# Patient Record
Sex: Female | Born: 1950
Health system: Southern US, Community
[De-identification: ages and names within clinical notes are randomized; demographics above are authoritative.]

## PROBLEM LIST (undated history)

## (undated) DIAGNOSIS — Z20818 Contact with and (suspected) exposure to other bacterial communicable diseases: Secondary | ICD-10-CM

## (undated) DIAGNOSIS — I341 Nonrheumatic mitral (valve) prolapse: Secondary | ICD-10-CM

## (undated) DIAGNOSIS — K589 Irritable bowel syndrome without diarrhea: Secondary | ICD-10-CM

## (undated) DIAGNOSIS — A0472 Enterocolitis due to Clostridium difficile, not specified as recurrent: Secondary | ICD-10-CM

## (undated) DIAGNOSIS — K819 Cholecystitis, unspecified: Secondary | ICD-10-CM

## (undated) DIAGNOSIS — B019 Varicella without complication: Secondary | ICD-10-CM

## (undated) DIAGNOSIS — M1990 Unspecified osteoarthritis, unspecified site: Secondary | ICD-10-CM

## (undated) DIAGNOSIS — Z8619 Personal history of other infectious and parasitic diseases: Secondary | ICD-10-CM

## (undated) DIAGNOSIS — I00 Rheumatic fever without heart involvement: Secondary | ICD-10-CM

## (undated) HISTORY — DX: Enterocolitis due to Clostridium difficile, not specified as recurrent: A04.72

## (undated) HISTORY — DX: Unspecified osteoarthritis, unspecified site: M19.90

## (undated) HISTORY — DX: Nonrheumatic mitral (valve) prolapse: I34.1

## (undated) HISTORY — DX: Rheumatic fever without heart involvement: I00

## (undated) HISTORY — DX: Varicella without complication: B01.9

## (undated) HISTORY — DX: Irritable bowel syndrome, unspecified: K58.9

## (undated) HISTORY — PX: CATARACT EXTRACTION: SUR2

## (undated) HISTORY — PX: MUSCLE BIOPSY: SHX716

## (undated) HISTORY — DX: Contact with and (suspected) exposure to other bacterial communicable diseases: Z20.818

## (undated) HISTORY — DX: Cholecystitis, unspecified: K81.9

## (undated) HISTORY — DX: Personal history of other infectious and parasitic diseases: Z86.19

---

## 1999-05-02 ENCOUNTER — Other Ambulatory Visit: Admission: RE | Admit: 1999-05-02 | Discharge: 1999-05-02 | Payer: Self-pay | Admitting: Obstetrics & Gynecology

## 2003-07-30 DIAGNOSIS — Z20818 Contact with and (suspected) exposure to other bacterial communicable diseases: Secondary | ICD-10-CM

## 2003-07-30 HISTORY — DX: Contact with and (suspected) exposure to other bacterial communicable diseases: Z20.818

## 2011-11-27 DIAGNOSIS — K819 Cholecystitis, unspecified: Secondary | ICD-10-CM

## 2011-11-27 HISTORY — DX: Cholecystitis, unspecified: K81.9

## 2011-12-17 ENCOUNTER — Emergency Department: Payer: Self-pay | Admitting: Emergency Medicine

## 2011-12-17 LAB — URINALYSIS, COMPLETE
Bacteria: NONE SEEN
Bilirubin,UR: NEGATIVE
Blood: NEGATIVE
Glucose,UR: NEGATIVE mg/dL (ref 0–75)
Ketone: NEGATIVE
Leukocyte Esterase: NEGATIVE
Nitrite: NEGATIVE
Ph: 5 (ref 4.5–8.0)
Protein: NEGATIVE
RBC,UR: <1 /HPF (ref 0–5)
Specific Gravity: 1.015 (ref 1.003–1.030)
Squamous Epithelial: NONE SEEN
WBC UR: 1 /HPF (ref 0–5)

## 2011-12-17 LAB — BASIC METABOLIC PANEL
Anion Gap: 8 (ref 7–16)
BUN: 8 mg/dL (ref 7–18)
Calcium, Total: 8.8 mg/dL (ref 8.5–10.1)
Chloride: 109 mmol/L — ABNORMAL HIGH (ref 98–107)
Co2: 26 mmol/L (ref 21–32)
Creatinine: 0.62 mg/dL (ref 0.60–1.30)
EGFR (African American): >60
EGFR (Non-African Amer.): >60
Glucose: 58 mg/dL — ABNORMAL LOW (ref 65–99)
Osmolality: 281 (ref 275–301)
Potassium: 3.9 mmol/L (ref 3.5–5.1)
Sodium: 143 mmol/L (ref 136–145)

## 2011-12-17 LAB — CBC
HCT: 42.5 % (ref 35.0–47.0)
HGB: 14.1 g/dL (ref 12.0–16.0)
MCH: 29.2 pg (ref 26.0–34.0)
MCHC: 33.3 g/dL (ref 32.0–36.0)
MCV: 88 fL (ref 80–100)
Platelet: 271 10*3/uL (ref 150–440)
RBC: 4.83 10*6/uL (ref 3.80–5.20)
RDW: 13.3 % (ref 11.5–14.5)
WBC: 7 10*3/uL (ref 3.6–11.0)

## 2011-12-17 LAB — TROPONIN I: Troponin-I: <0.02 ng/mL

## 2011-12-17 IMAGING — CR DG CHEST 1V PORT
1 series · 1 of 1 positions shown · non-contrast
Comparison: none

REASON FOR EXAM: arm tingling
COMMENTS:

[portable]
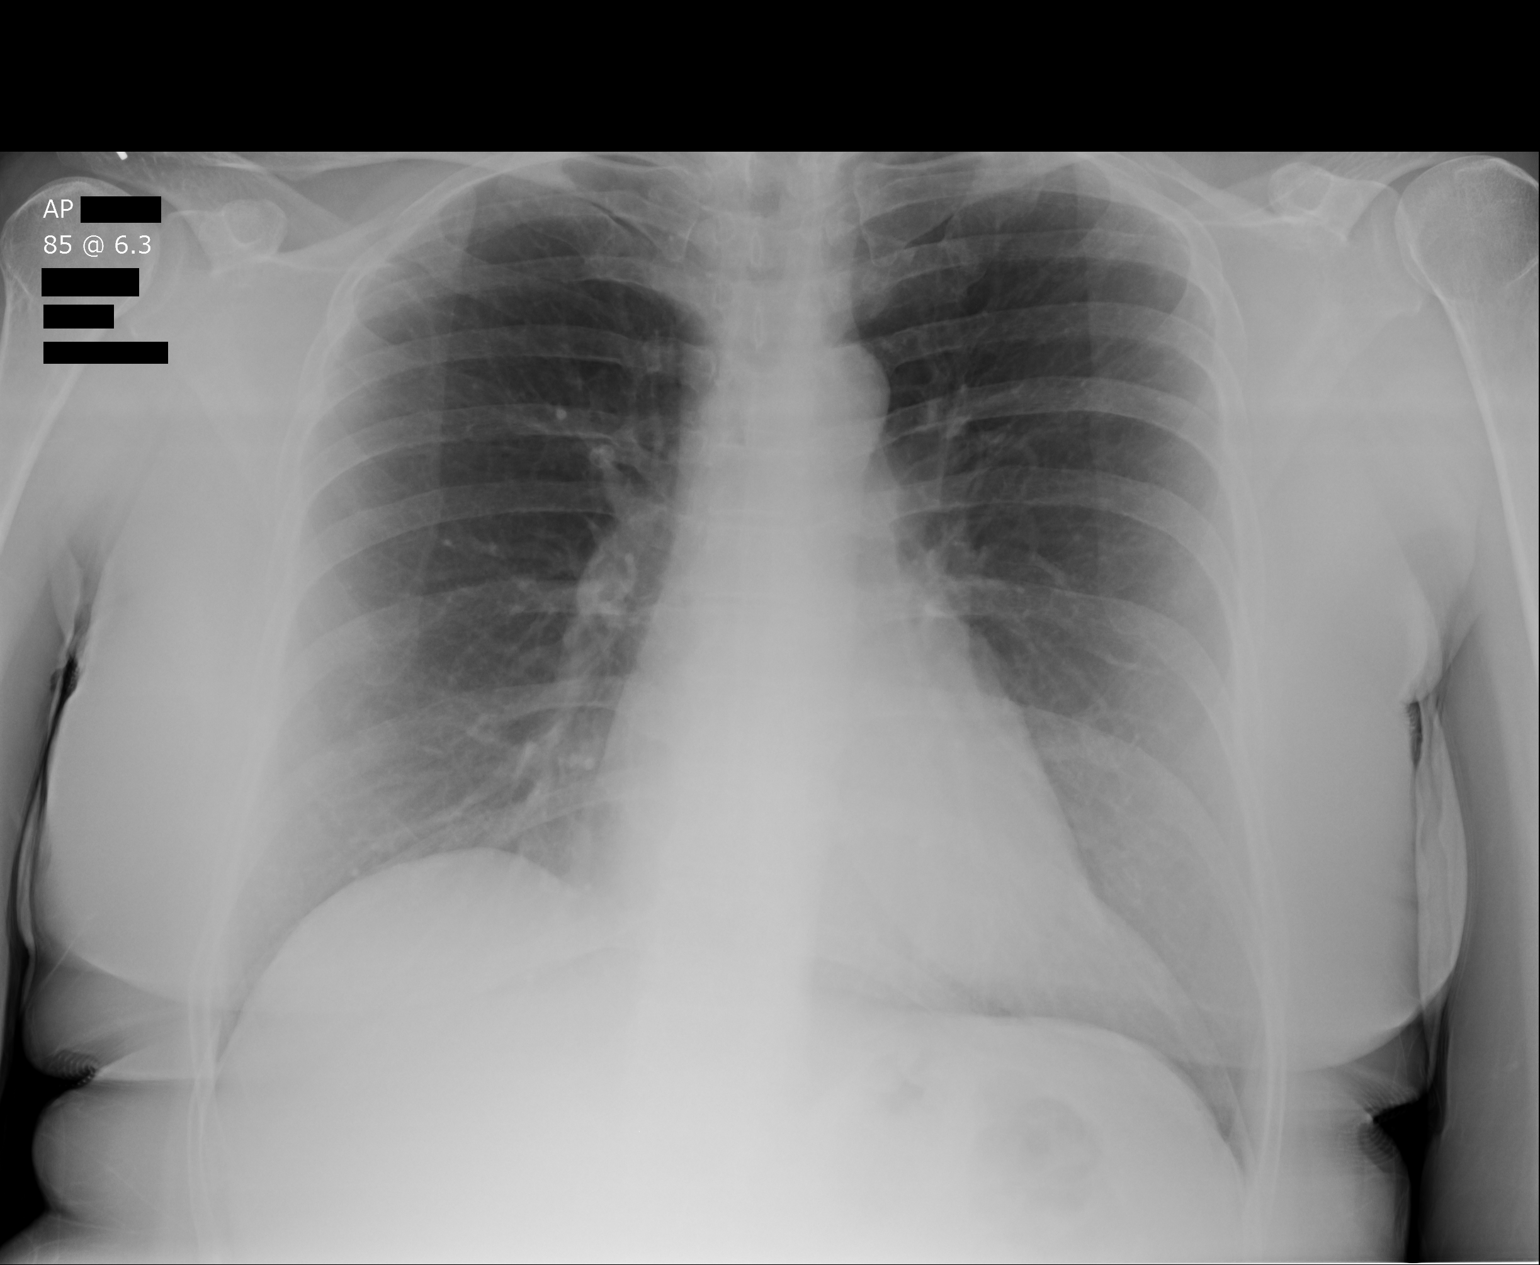

[1 of 1 positions shown; findings below may reference images not displayed]

PROCEDURE:     DXR - DXR PORTABLE CHEST SINGLE VIEW  - [DATE]  [DATE]

RESULT:     The projection is lordotic. The lungs are clear. The heart and
pulmonary vessels are normal. The bony and mediastinal structures are
unremarkable. There is no effusion. There is no pneumothorax or evidence of
congestive failure.
IMPRESSION: No acute cardiopulmonary disease.

[REDACTED]

## 2012-07-28 ENCOUNTER — Telehealth: Payer: Self-pay | Admitting: Internal Medicine

## 2012-07-28 NOTE — Telephone Encounter (Signed)
Routing to Dr. Scott

## 2012-07-28 NOTE — Telephone Encounter (Signed)
I called and spoke with patient and advised her of the information that Dr. Lorin Picket put down below. Patient states she has been trying to establish with Dr. Lorin Picket for the past 10 years, she does have an appointment with Dr. Lorin Picket on 09/08/12 to establish care.  She did understand that we don't have any appointments available. She wasn't really impressed with acute care but she will do something.

## 2012-07-28 NOTE — Telephone Encounter (Signed)
If severe ear pain does need evaluation.  I have never seen this pt and she is not an established pt at the office.  Will need to establish care here.  I rec if increased pain - reevaluation at acute care.  I am also unable to work in today and we will not be in the office tomorrow.

## 2012-07-28 NOTE — Telephone Encounter (Signed)
Patient Information:  Caller Name: Anetra  Phone: [phone]  Patient: Lynn Mann, Lynn Mann  Gender: Female  DOB: [birth year]  Age: 61 Years  PCP: [facility]  Office Follow Up:  Does the office need to follow up with this patient?: Yes  Instructions For The Office: if there are any work-ins available today or tomorrow please call office back   Symptoms  Reason For Call & Symptoms: Pt will become a pt in Feb. She has a new pt appt scheduled in Feb/2014 with Dr. Lorin Picket. She is calling because she has severe ear pain in both ears. Onset yesterday. Last evening the pt went to [facility] and was sent home with home care. Pt is still c/o.  Reviewed Health History In EMR: Yes  Reviewed Medications In EMR: Yes  Reviewed Allergies In EMR: Yes  Reviewed Surgeries / Procedures: Yes  Date of Onset of Symptoms: 07/27/2012  Guideline(s) Used:  Earache  Disposition Per Guideline:   Go to Office Now  Reason For Disposition Reached:   Severe earache pain  Advice Given:  N/A  RN Overrode Recommendation:  Go To [facility]  Pt has not been seen at this office yet/she has a new pt appt scheduled for February. She would like to be seen at this office if possible. Please call her back if any work - ins available.

## 2012-08-27 ENCOUNTER — Telehealth: Payer: Self-pay | Admitting: Internal Medicine

## 2012-08-27 NOTE — Telephone Encounter (Signed)
Patient Information:  Caller Name: Milo  Phone: [phone]  Patient: Lynn Mann, Lynn Mann  Gender: Female  DOB: 1951/05/20  Age: 62 Years  PCP: [facility] (Adults only)  Office Follow Up:  Does the office need to follow up with this patient?: No  Instructions For The Office: N/A   Symptoms  Reason For Call & Symptoms: Patient states she is having pain in back. Onset last Wednesday, while getting out of bed.  Located- right side flank pain .  Radiates down into buttock.  Pain with pushing accelerator on car, getting into the car.  Pain with having BM but relief after going by relieving pressure.  Intermittent in nature.  Reviewed Health History In EMR: Yes  Reviewed Medications In EMR: Yes  Reviewed Allergies In EMR: Yes  Reviewed Surgeries / Procedures: No  Date of Onset of Symptoms: 08/12/2012  Treatments Tried: Aleve or ASA  Treatments Tried Worked: Yes  Guideline(s) Used:  Back Pain  Disposition Per Guideline:   See Today or Tomorrow in Office  Reason For Disposition Reached:   Age > 50 and no history of prior similar back pain  Advice Given:  Reassurance:  Twisting or heavy lifting can cause back pain.  With treatment, the pain most often goes away in 1-2 weeks.  You can treat most back pain at home.  Here is some care advice that should help.  Cold or Heat:  Heat Pack: If pain lasts over 2 days, apply heat to the sore area. Use a heat pack, heating pad, or warm wet washcloth. Do this for 10 minutes, then as needed. For widespread stiffness, take a hot bath or hot shower instead. Move the sore area under the warm water.  Sleep:  Sleep on your side with a pillow between your knees. If you sleep on your back, put a pillow under your knees.  Avoid sleeping on your stomach.  Your mattress should be firm. Avoid waterbeds.  Activity  Keep doing your day-to-day activities if it is not too painful. Staying active is better than resting.  Avoid anything that makes your pain  worse. Avoid heavy lifting, twisting, and too much exercise until your back heals.  You do not need to stay in bed.  Pain Medicines:  For pain relief, take acetaminophen, ibuprofen, or naproxen.  Use the lowest amount of medicine that makes your pain feel better.  Acetaminophen (e.g., Tylenol):  Another choice is to take 1,000 mg every 8 hours as needed. Each Extra Strength Tylenol pill has 500 mg of acetaminophen. The most you should take each day is 3,000 mg (6 Extra Strength pills a day).  Call Back If:  Numbness or weakness occur  Bowel/bladder problems occur  Pain lasts for more than 2 weeks  You become worse.  Appointment Scheduled:  08/28/2012 08:30:00 Appointment Scheduled Provider:  Orville Govern

## 2012-08-28 ENCOUNTER — Encounter: Payer: Self-pay | Admitting: Adult Health

## 2012-08-28 ENCOUNTER — Ambulatory Visit: Payer: Self-pay | Admitting: Adult Health

## 2012-08-28 ENCOUNTER — Ambulatory Visit (INDEPENDENT_AMBULATORY_CARE_PROVIDER_SITE_OTHER): Payer: BC Managed Care – PPO | Admitting: Adult Health

## 2012-08-28 ENCOUNTER — Ambulatory Visit: Payer: Self-pay

## 2012-08-28 VITALS — BP 116/79 | HR 93 | Temp 99.1°F | Resp 16 | Ht 67.0 in | Wt 197.0 lb

## 2012-08-28 DIAGNOSIS — M545 Low back pain, unspecified: Secondary | ICD-10-CM

## 2012-08-28 DIAGNOSIS — Z8679 Personal history of other diseases of the circulatory system: Secondary | ICD-10-CM | POA: Insufficient documentation

## 2012-08-28 DIAGNOSIS — R1031 Right lower quadrant pain: Secondary | ICD-10-CM

## 2012-08-28 DIAGNOSIS — I341 Nonrheumatic mitral (valve) prolapse: Secondary | ICD-10-CM | POA: Insufficient documentation

## 2012-08-28 LAB — CBC WITH DIFFERENTIAL/PLATELET
Basophils Absolute: 0 10*3/uL (ref 0.0–0.1)
Basophils Relative: 0.4 % (ref 0.0–3.0)
Eosinophils Absolute: 0.2 10*3/uL (ref 0.0–0.7)
Eosinophils Relative: 3.3 % (ref 0.0–5.0)
HCT: 39.6 % (ref 36.0–46.0)
Hemoglobin: 13.4 g/dL (ref 12.0–15.0)
Lymphocytes Relative: 26.8 % (ref 12.0–46.0)
Lymphs Abs: 1.8 10*3/uL (ref 0.7–4.0)
MCHC: 33.9 g/dL (ref 30.0–36.0)
MCV: 86.4 fl (ref 78.0–100.0)
Monocytes Absolute: 0.3 10*3/uL (ref 0.1–1.0)
Monocytes Relative: 4.9 % (ref 3.0–12.0)
Neutro Abs: 4.4 10*3/uL (ref 1.4–7.7)
Neutrophils Relative %: 64.6 % (ref 43.0–77.0)
Platelets: 307 10*3/uL (ref 150.0–400.0)
RBC: 4.58 Mil/uL (ref 3.87–5.11)
RDW: 13.5 % (ref 11.5–14.6)
WBC: 6.8 10*3/uL (ref 4.5–10.5)

## 2012-08-28 LAB — URINALYSIS, ROUTINE W REFLEX MICROSCOPIC
Bilirubin Urine: NEGATIVE
Hgb urine dipstick: NEGATIVE
Leukocytes, UA: NEGATIVE
Nitrite: NEGATIVE
Specific Gravity, Urine: >=1.03 (ref 1.000–1.030)
Total Protein, Urine: NEGATIVE
Urine Glucose: NEGATIVE
Urobilinogen, UA: 0.2 (ref 0.0–1.0)
pH: 5.5 (ref 5.0–8.0)

## 2012-08-28 LAB — SEDIMENTATION RATE: Sed Rate: 16 mm/hr (ref 0–22)

## 2012-08-28 IMAGING — CT CT ABD-PELV W/ CM
1 of 2 series · 15 of 32 positions shown, 19 images · IV contrast (isovue)
Comparison: None

REASON FOR EXAM: Call Report  [PHONE_NUMBER]  abd pain RLQ  eval for
appendicitis  MORIKAWA NP ...
COMMENTS:

PROCEDURE:     CT  - CT ABDOMEN / PELVIS  W  - [DATE]  [DATE]
RESULT:     History: Right lower quadrant pain
TECHNIQUE: Multiple axial images of the abdomen and pelvis were performed
from the lung bases to the pubic symphysis, with p.o. contrast and with 85
ml of Isovue 370 intravenous contrast.

[Series 2: 3mm soft tissue · axial · 0.76mm/px · z∈[-462,-22]mm · 15 of 162 slices shown, 19 images]
[im 8/162  soft-tissue]
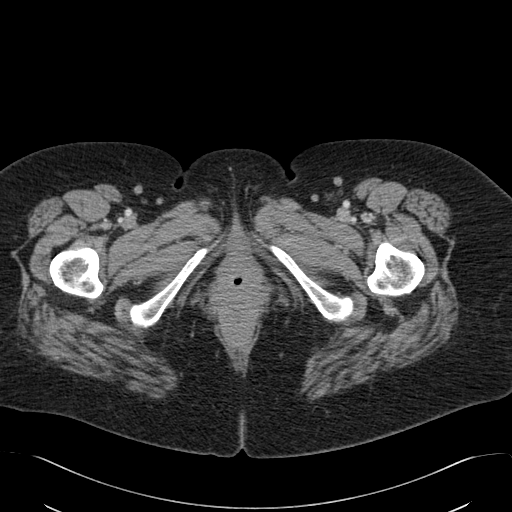
[im 8/162  bone]
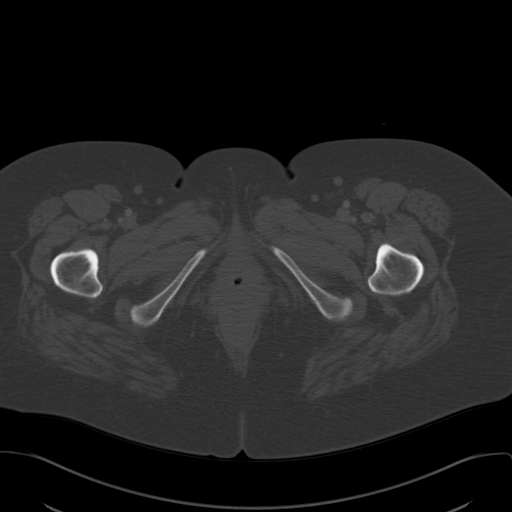
[im 22/162  soft-tissue]
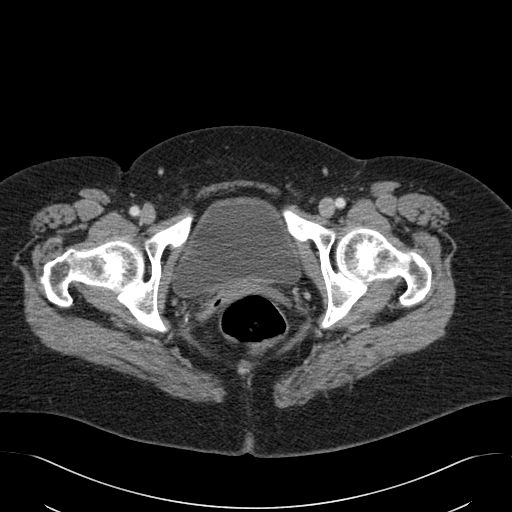
[im 36/162  soft-tissue]
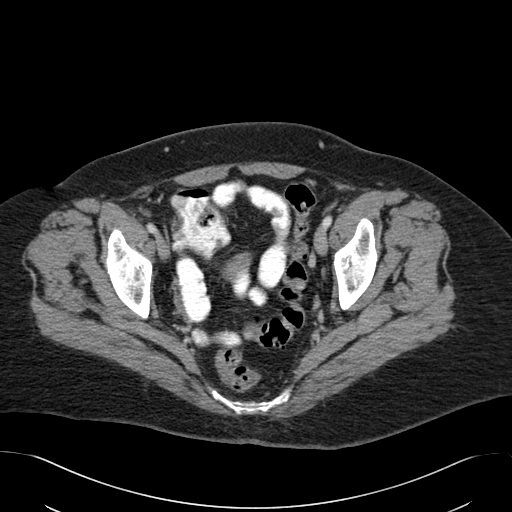
[im 43/162  soft-tissue]
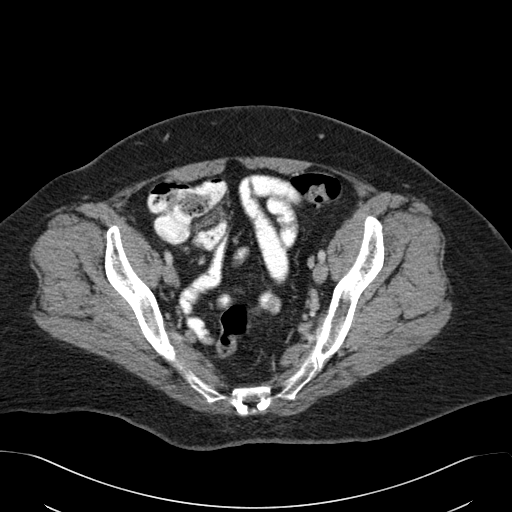
[im 57/162  soft-tissue]
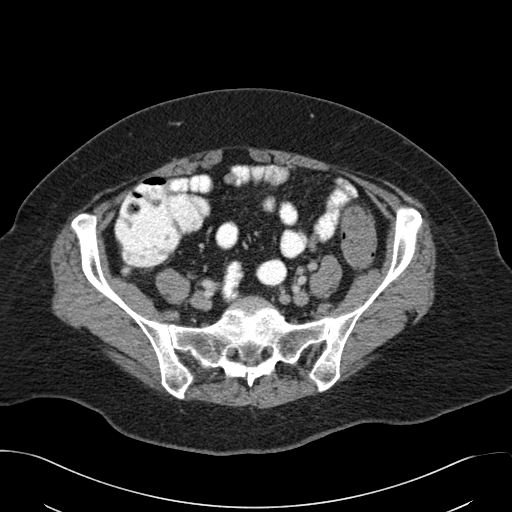
[im 71/162  soft-tissue]
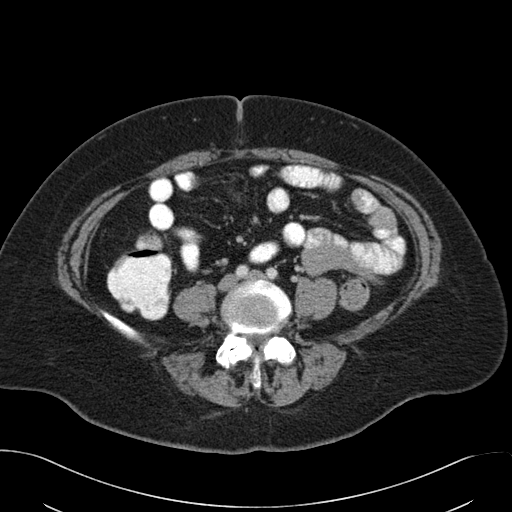
[im 85/162  soft-tissue]
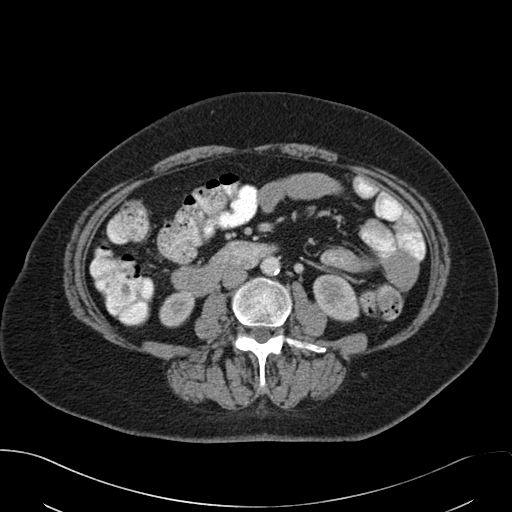
[im 92/162  soft-tissue]
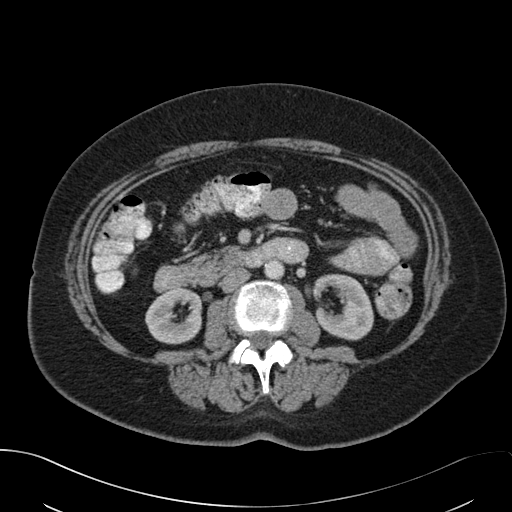
[im 106/162  soft-tissue]
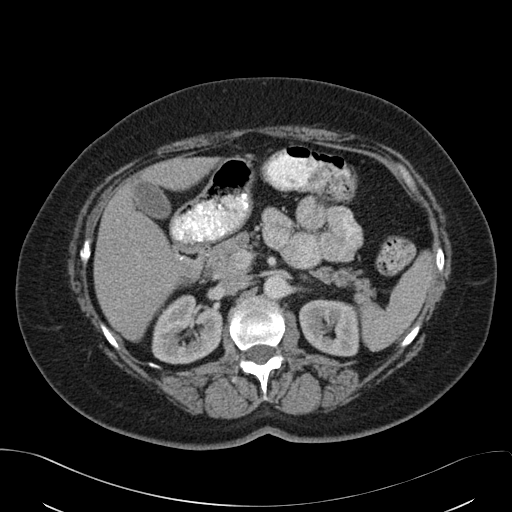
[im 106/162  bone]
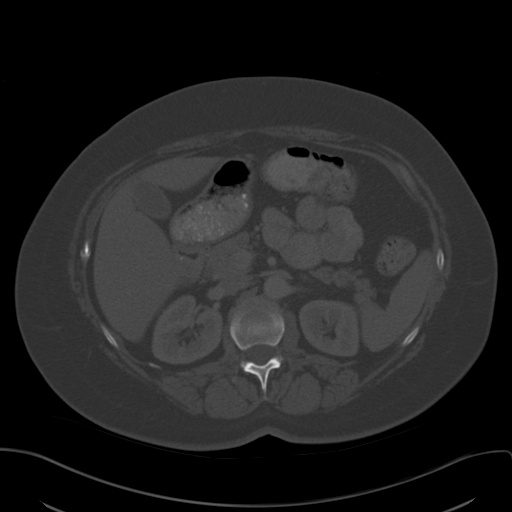
[im 120/162  soft-tissue]
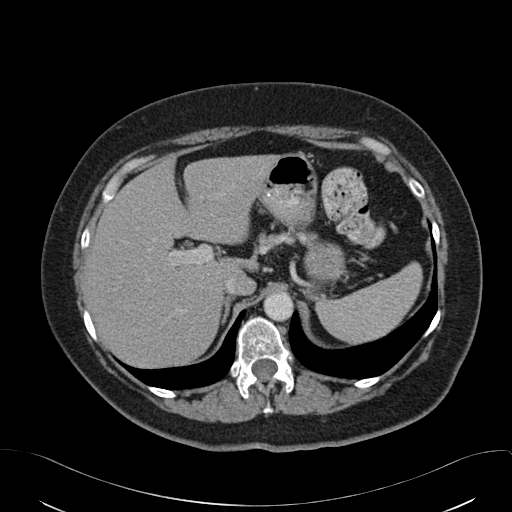
[im 127/162  soft-tissue]
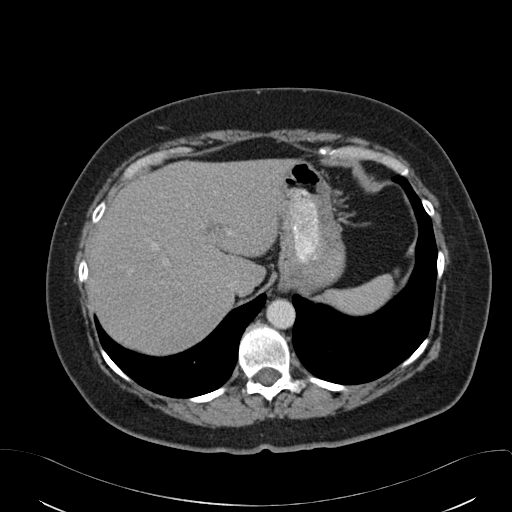
[im 134/162  lung]
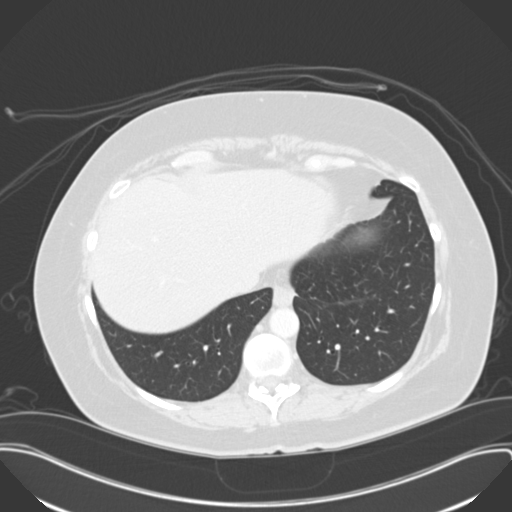
[im 141/162  soft-tissue]
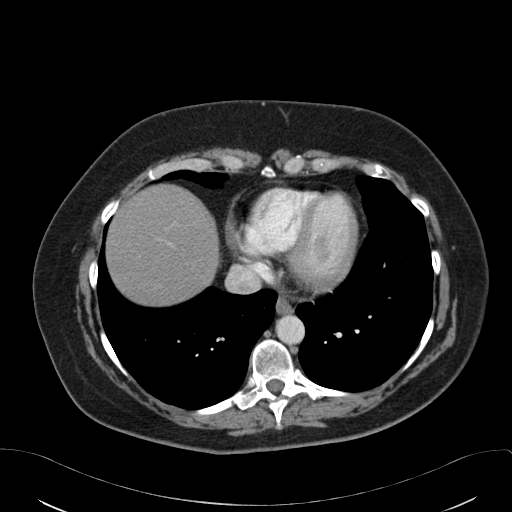
[im 141/162  lung]
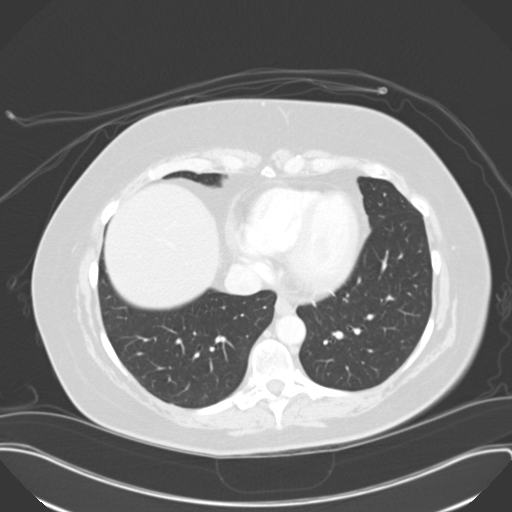
[im 148/162  lung]
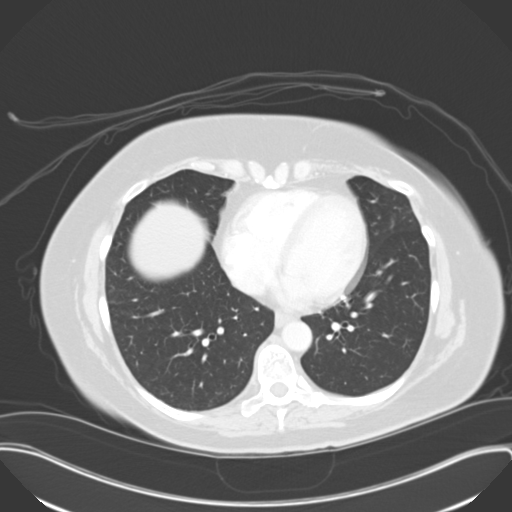
[im 155/162  soft-tissue]
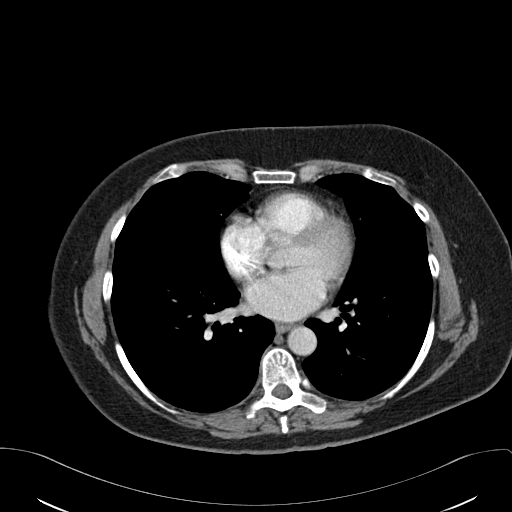
[im 155/162  lung]
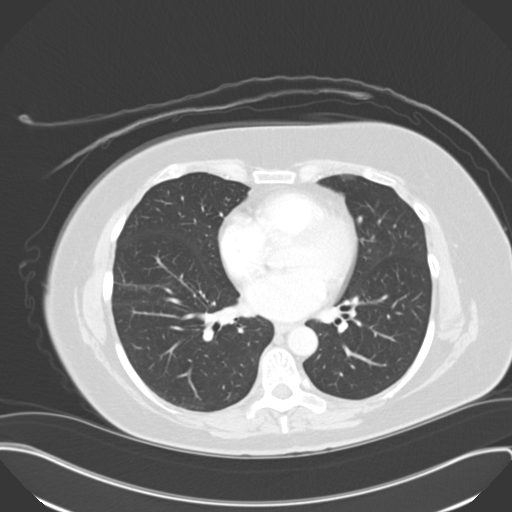

[15 of 32 positions shown; findings below may reference images not displayed]

FINDINGS: The lung bases are clear. There is no pneumothorax. The heart size is
normal.

The liver demonstrates no focal abnormality. There is no intrahepatic or
extrahepatic biliary ductal dilatation. The gallbladder is unremarkable. The
spleen demonstrates no focal abnormality. The kidneys, adrenal glands, and
pancreas are normal. The bladder is unremarkable.

The stomach, duodenum, small intestine, and large intestine demonstrate no
contrast extravasation or dilatation. There is a normal caliber appendix in
the right lower quadrant without periappendiceal inflammatory changes. There
is no pneumoperitoneum, pneumatosis, or portal venous gas. There is no
abdominal or pelvic free fluid. There is no lymphadenopathy.

The abdominal aorta is normal in caliber .

The osseous structures are unremarkable.
IMPRESSION: 1. Normal appendix.

[REDACTED]

## 2012-08-28 MED ORDER — CYCLOBENZAPRINE HCL 5 MG PO TABS: 5.0000 mg | ORAL_TABLET | Freq: Three times a day (TID) | ORAL | Status: DC | PRN

## 2012-08-28 NOTE — Assessment & Plan Note (Addendum)
Positive rebound tenderness. Positive obturator sign. R/O appendicitis. Check cbc, sed rate, urinalysis. CT abdomen/pelvis w IV and oral contrast. Patient sent to [facility] for CT scan. Note, greater than 45 minutes were spent in direct face to face communication with patient.

## 2012-08-28 NOTE — Patient Instructions (Addendum)
Abdominal right lower quadrant pain - CT scan was negative for appendicitis.  Your labs - cbc, sed rate, urinalysis were normal.  For back pain:  Flexeril 5 mg 3 times daily as needed for spasms. Apply ice/heat for 20 min, 3-4 times daily Ibuprofen 600 mg every 6 hours as needed for pain Norco 5/325 mg 1 tablet as needed for stronger pain not relieved with Ibuprofen If you lie on your back, use a firm pillow below your knees to support your back If you lie on your side, use a firm pillow between your knees to help keep your hips aligned Continue your normal activity as tolerated. You do not need to stay in bed.  If your symptoms are not resolved within 4 weeks we will send you for xray of your back.  Please report any numbness, tingling or loss of sensation/mobility immediately.

## 2012-08-28 NOTE — Assessment & Plan Note (Signed)
Will start muscle relaxer, NSAIDs, pain medication, ice/heat, continue normal activity as tolerated. If pain not resolved within 4 weeks will send for films to further evaluate.

## 2012-08-28 NOTE — Progress Notes (Signed)
  Subjective:    Patient ID: Lynn Mann, female    DOB: Oct 07, 1950, 62 y.o.   MRN: [id number]  HPI  Patient is a 62 y/o female who presents to clinic today for the first time with c/o back pain. She describes the pain as localized over right lower back. The pain does not radiate down the leg. She denies loss of sensation, paresthesia, numbness, or loss of bladder/bowel function. The pain started approximately 1 week ago. She cannot remember any activity that contributed to her back pain. The only things she recalls is that she went to eat [place] food and woke up the next day with the low back pain. She has tried aleve which has helped somewhat. She has not iced the area or applied heat. Pt also reports that having a BM alleviated the pain. She denies constipation, n/v, diarrhea.  Patient also reports that she has abdominal RLQ pain. This started around the same time as her back pain - after eating [place] food. The pain does not radiate to the back or vice versa. She reports the pain is aggravated with movement or when she eats. She denies fever, chills. Patient has her appendix.   Medications:  Hydrocodone 5/325 1 tablet every 6 hours prn pain (Cholecystitis)    Review of Systems  Constitutional: Negative for fever, chills, activity change, appetite change and fatigue.  HENT: Negative.   Eyes: Negative.   Respiratory: Negative.   Cardiovascular: Negative.   Gastrointestinal: Positive for abdominal pain. Negative for nausea, vomiting, diarrhea, constipation and blood in stool.       Occassional gnawing feeling in stomach  Genitourinary: Negative for dysuria, urgency, frequency, hematuria, flank pain, decreased urine volume, vaginal bleeding, vaginal discharge and vaginal pain.       RLQ pain.  Musculoskeletal: Positive for back pain. Negative for gait problem.  Skin: Negative for rash and wound.  Neurological: Negative for dizziness, tremors, weakness, light-headedness, numbness and  headaches.  Psychiatric/Behavioral: Negative.         Objective:   Physical Exam  Constitutional: She is oriented to person, place, and time. She appears well-developed and well-nourished. No distress.  HENT:  Head: Normocephalic and atraumatic.  Cardiovascular: Normal rate and regular rhythm.   Pulmonary/Chest: Effort normal and breath sounds normal.  Abdominal: Soft. Bowel sounds are normal. She exhibits no mass. There is tenderness. There is rebound and guarding.  Musculoskeletal: Normal range of motion.  Neurological: She is alert and oriented to person, place, and time.  Skin: Skin is warm and dry.  Psychiatric: She has a normal mood and affect. Her behavior is normal. Judgment and thought content normal.          Assessment & Plan:

## 2012-08-30 LAB — URINE CULTURE: Colony Count: 15000

## 2012-09-04 ENCOUNTER — Encounter: Payer: Self-pay | Admitting: *Deleted

## 2012-09-04 ENCOUNTER — Telehealth: Payer: Self-pay | Admitting: *Deleted

## 2012-09-04 ENCOUNTER — Other Ambulatory Visit: Payer: Self-pay | Admitting: Adult Health

## 2012-09-04 NOTE — Telephone Encounter (Signed)
I have not seen this pt.  It looks like the only person she has seen is Lynn Mann.  Can forward to Lynn Mann.  If got a med from another md - will probably need to call them to get clarification on medication.

## 2012-09-04 NOTE — Telephone Encounter (Signed)
Patient calling stating she has to cancel her appt on Tuesday because she's going out of town for 2 weeks maybe, on Sunday. She has a bottle of hydrocodone that was filled in [place] at the ER and per pt the label has worn off and she can't take the bottle on the airplane. Patient would like a new label or new rx? Please advise

## 2012-09-04 NOTE — Telephone Encounter (Signed)
Refill called in to [facility] in [place]

## 2012-09-08 ENCOUNTER — Ambulatory Visit: Payer: Self-pay | Admitting: Internal Medicine

## 2012-11-06 ENCOUNTER — Ambulatory Visit (INDEPENDENT_AMBULATORY_CARE_PROVIDER_SITE_OTHER): Payer: BC Managed Care – PPO | Admitting: Internal Medicine

## 2012-11-06 ENCOUNTER — Encounter: Payer: Self-pay | Admitting: Internal Medicine

## 2012-11-06 VITALS — BP 126/68 | HR 94 | Temp 98.1°F | Resp 18 | Ht 68.0 in | Wt 190.8 lb

## 2012-11-06 DIAGNOSIS — I341 Nonrheumatic mitral (valve) prolapse: Secondary | ICD-10-CM

## 2012-11-06 DIAGNOSIS — Z8619 Personal history of other infectious and parasitic diseases: Secondary | ICD-10-CM

## 2012-11-06 DIAGNOSIS — Z13220 Encounter for screening for lipoid disorders: Secondary | ICD-10-CM

## 2012-11-06 DIAGNOSIS — Z1239 Encounter for other screening for malignant neoplasm of breast: Secondary | ICD-10-CM

## 2012-11-06 DIAGNOSIS — I059 Rheumatic mitral valve disease, unspecified: Secondary | ICD-10-CM

## 2012-11-06 DIAGNOSIS — K589 Irritable bowel syndrome without diarrhea: Secondary | ICD-10-CM

## 2012-11-06 DIAGNOSIS — K8020 Calculus of gallbladder without cholecystitis without obstruction: Secondary | ICD-10-CM

## 2012-11-06 DIAGNOSIS — Z8679 Personal history of other diseases of the circulatory system: Secondary | ICD-10-CM

## 2012-11-07 ENCOUNTER — Encounter: Payer: Self-pay | Admitting: Internal Medicine

## 2012-11-07 DIAGNOSIS — K589 Irritable bowel syndrome without diarrhea: Secondary | ICD-10-CM | POA: Insufficient documentation

## 2012-11-07 DIAGNOSIS — K8020 Calculus of gallbladder without cholecystitis without obstruction: Secondary | ICD-10-CM | POA: Insufficient documentation

## 2012-11-07 NOTE — Assessment & Plan Note (Signed)
Has a history of MVP.  Obtain ECHO report.

## 2012-11-07 NOTE — Assessment & Plan Note (Signed)
States bowels are stable. Last colonoscopy 2007.

## 2012-11-07 NOTE — Progress Notes (Signed)
Subjective:    Patient ID: Lynn Mann, female    DOB: 1951-07-12, 62 y.o.   MRN: [id number]  HPI 62 year old female with past history of MVP, rheumatic fever, and IBS who comes in today to follow up on these issues as well as to establish care.  She has not had a regular primary care physician.  Has not had a mammogram or pap smear since 2007.  Previously saw Dr Donata Duff.   She was evaluated 5/13 by Dr Lady Gary.  Had a negative stress test and ECHO reportedly ok.  Denies any chest pain or tightness with increased activity or exertion.  Was having back pain in 8/13.  Was found to have gallstones.  Was evaluated by Dr Maxcine Ham ([facility] - surgery).  Did not recommend surgery.  Has had no reoccurrence.  Does have a history of IBS.  May have a bowel movement 2-3x/day.  This is normal for her.  No blood.  Had a colonoscopy 2007.     Past Medical History  Diagnosis Date  . Arthritis   . Chicken pox   . Rheumatic fever   . MRSA exposure 2005    Spider bite  . Cholecystitis 11/2011    Did not require sgy - Dr. Sammuel Cooper - [facility]  (cholelithiasis)  . MVP (mitral valve prolapse)     Stable - Dr. Lady Gary  . Hypertension   . IBS (irritable bowel syndrome)   . H/O Clostridium difficile infection     Current Outpatient Prescriptions on File Prior to Visit  Medication Sig Dispense Refill  . cyclobenzaprine (FLEXERIL) 5 MG tablet Take 1 tablet (5 mg total) by mouth 3 (three) times daily as needed for muscle spasms.  30 tablet  0  . HYDROcodone-acetaminophen (NORCO/VICODIN) 5-325 MG per tablet Take 1 tablet by mouth every 6 (six) hours as needed.       No current facility-administered medications on file prior to visit.    Review of Systems Patient denies any headache, lightheadedness or dizziness.  No sinus or allergy symptoms.  No chest pain, tightness or palpitations.  No increased shortness of breath, cough or congestion.  No acid reflux.  No dysphagia.  No nausea or vomiting.  No abdominal pain or  cramping.  No bowel change, such as diarrhea, constipation, BRBPR or melana.  Bowels stable for her.   No urine change.   Has never had an abnormal pap smear.       Objective:   Physical Exam Filed Vitals:   11/06/12 1109  BP: 126/68  Pulse: 94  Temp: 98.1 F (36.7 C)  Resp: 17   62 year old female in no acute distress.   HEENT:  Nares- clear.  Oropharynx - without lesions. NECK:  Supple.  Nontender.  No audible bruit.  Increased fullness right lateral neck (question of increased lymphadenopathy).  Non tender.  HEART:  Appears to be regular. LUNGS:  No crackles or wheezing audible.  Respirations even and unlabored.  RADIAL PULSE:  Equal bilaterally.  ABDOMEN:  Soft, nontender.  Bowel sounds present and normal.  No audible abdominal bruit.    EXTREMITIES:  No increased edema present.  DP pulses palpable and equal bilaterally.      SKIN:  No rash.      Assessment & Plan:  LYMPHADENOPATHY.  Increased fullness right lateral neck. She states she has noticed some increased fullness over the last two weeks.  No evidence of infection.  Discussed my desire to refer to ENT  for evaluation.  She declines at this time.  Wants to monitor.  Will call within the next two weeks.  If persistent, will require referral and further w/up.    HEALTH MAINTENANCE.  Overdue a physical.  Schedule a physical for next visit.  Schedule her mammogram.  Hopefully can obtain records (i.e., colonoscopy, etc).    I spent over 40 minutes with this patient with more than 50% of the time in consultation regarding the above.

## 2012-11-07 NOTE — Assessment & Plan Note (Signed)
Saw Dr Lady Gary 5/13.  States had normal stress test.  Also reports ECHO - ok.  Obtain records.

## 2012-11-07 NOTE — Assessment & Plan Note (Signed)
Has a history of gallstones.  Saw Dr Maxcine Ham ([facility] surgery).  Decided did not need cholecystectomy.  Currently doing well.  Follow.

## 2012-11-08 ENCOUNTER — Telehealth: Payer: Self-pay | Admitting: Internal Medicine

## 2012-11-08 ENCOUNTER — Encounter: Payer: Self-pay | Admitting: Internal Medicine

## 2012-11-08 NOTE — Telephone Encounter (Signed)
Pt left without scheduling labs.  She needs fasting labs scheduled in 1-2 weeks.  Thanks.

## 2012-11-09 NOTE — Telephone Encounter (Signed)
Scheduled

## 2012-11-17 ENCOUNTER — Other Ambulatory Visit (INDEPENDENT_AMBULATORY_CARE_PROVIDER_SITE_OTHER): Payer: BC Managed Care – PPO

## 2012-11-17 DIAGNOSIS — Z8619 Personal history of other infectious and parasitic diseases: Secondary | ICD-10-CM

## 2012-11-17 DIAGNOSIS — Z8679 Personal history of other diseases of the circulatory system: Secondary | ICD-10-CM

## 2012-11-17 DIAGNOSIS — K589 Irritable bowel syndrome without diarrhea: Secondary | ICD-10-CM

## 2012-11-17 DIAGNOSIS — Z13220 Encounter for screening for lipoid disorders: Secondary | ICD-10-CM

## 2012-11-17 LAB — CBC WITH DIFFERENTIAL/PLATELET
Basophils Absolute: 0 10*3/uL (ref 0.0–0.1)
Basophils Relative: 0.4 % (ref 0.0–3.0)
Eosinophils Absolute: 0.1 10*3/uL (ref 0.0–0.7)
Eosinophils Relative: 1.8 % (ref 0.0–5.0)
HCT: 39.2 % (ref 36.0–46.0)
Hemoglobin: 13.1 g/dL (ref 12.0–15.0)
Lymphocytes Relative: 36.5 % (ref 12.0–46.0)
Lymphs Abs: 1.9 10*3/uL (ref 0.7–4.0)
MCHC: 33.5 g/dL (ref 30.0–36.0)
MCV: 86.8 fl (ref 78.0–100.0)
Monocytes Absolute: 0.3 10*3/uL (ref 0.1–1.0)
Monocytes Relative: 6.1 % (ref 3.0–12.0)
Neutro Abs: 2.9 10*3/uL (ref 1.4–7.7)
Neutrophils Relative %: 55.2 % (ref 43.0–77.0)
Platelets: 291 10*3/uL (ref 150.0–400.0)
RBC: 4.51 Mil/uL (ref 3.87–5.11)
RDW: 13.4 % (ref 11.5–14.6)
WBC: 5.3 10*3/uL (ref 4.5–10.5)

## 2012-11-17 LAB — COMPREHENSIVE METABOLIC PANEL
ALT: 17 U/L (ref 0–35)
AST: 20 U/L (ref 0–37)
Albumin: 3.8 g/dL (ref 3.5–5.2)
Alkaline Phosphatase: 61 U/L (ref 39–117)
BUN: 7 mg/dL (ref 6–23)
CO2: 27 mEq/L (ref 19–32)
Calcium: 9.2 mg/dL (ref 8.4–10.5)
Chloride: 105 mEq/L (ref 96–112)
Creatinine, Ser: 0.7 mg/dL (ref 0.4–1.2)
GFR: 93.75 mL/min (ref >60.00)
Glucose, Bld: 100 mg/dL — ABNORMAL HIGH (ref 70–99)
Potassium: 4.6 mEq/L (ref 3.5–5.1)
Sodium: 141 mEq/L (ref 135–145)
Total Bilirubin: 1.1 mg/dL (ref 0.3–1.2)
Total Protein: 7.1 g/dL (ref 6.0–8.3)

## 2012-11-17 LAB — LIPID PANEL
Cholesterol: 200 mg/dL (ref 0–200)
HDL: 28.4 mg/dL — ABNORMAL LOW (ref >39.00)
Total CHOL/HDL Ratio: 7
Triglycerides: 296 mg/dL — ABNORMAL HIGH (ref 0.0–149.0)
VLDL: 59.2 mg/dL — ABNORMAL HIGH (ref 0.0–40.0)

## 2012-11-17 LAB — LDL CHOLESTEROL, DIRECT: Direct LDL: 116.3 mg/dL

## 2012-11-17 LAB — TSH: TSH: 2.89 u[IU]/mL (ref 0.35–5.50)

## 2012-11-18 ENCOUNTER — Telehealth: Payer: Self-pay

## 2012-11-18 NOTE — Telephone Encounter (Signed)
Pt was notified that her cholesterol/triglycerides are increased. Recommend a low fat/low cholesterol diet. Send her a copy of the [facility] lipid diet ([place] diet). Will follow. Other labs ok.  Sending the [facility] lipid diet ([place] diet) and her labs also sent to her home address.

## 2012-11-27 ENCOUNTER — Ambulatory Visit (INDEPENDENT_AMBULATORY_CARE_PROVIDER_SITE_OTHER): Payer: BC Managed Care – PPO | Admitting: Adult Health

## 2012-11-27 ENCOUNTER — Encounter: Payer: Self-pay | Admitting: Adult Health

## 2012-11-27 VITALS — BP 100/68 | HR 101 | Temp 98.5°F | Resp 14 | Wt 188.0 lb

## 2012-11-27 DIAGNOSIS — J029 Acute pharyngitis, unspecified: Secondary | ICD-10-CM

## 2012-11-27 DIAGNOSIS — J069 Acute upper respiratory infection, unspecified: Secondary | ICD-10-CM

## 2012-11-27 DIAGNOSIS — R509 Fever, unspecified: Secondary | ICD-10-CM

## 2012-11-27 DIAGNOSIS — J189 Pneumonia, unspecified organism: Secondary | ICD-10-CM | POA: Insufficient documentation

## 2012-11-27 LAB — POCT RAPID STREP A (OFFICE): Rapid Strep A Screen: NEGATIVE

## 2012-11-27 LAB — POCT INFLUENZA A/B
Influenza A, POC: NEGATIVE
Influenza B, POC: NEGATIVE

## 2012-11-27 MED ORDER — AZITHROMYCIN 250 MG PO TABS: ORAL_TABLET | ORAL | Status: DC

## 2012-11-27 NOTE — Assessment & Plan Note (Signed)
Negative for influenza and strep throat. Fever greater than 100x2 days. Will start azithromycin. RTC if symptoms do not improve by Monday.

## 2012-11-27 NOTE — Progress Notes (Signed)
  Subjective:    Patient ID: Lynn Mann, female    DOB: [birth year], 62 y.o.   MRN: [id number]  HPI  Patient presents with symptoms of fever, chills, sore throat, coughing that came on suddenly on Wednesday evening. Fever > 100 x 2 days. No fever today. Denies shortness of breath.   Current Outpatient Prescriptions on File Prior to Visit  Medication Sig Dispense Refill  . HYDROcodone-acetaminophen (NORCO/VICODIN) 5-325 MG per tablet Take 1 tablet by mouth every 6 (six) hours as needed.      . cyclobenzaprine (FLEXERIL) 5 MG tablet Take 1 tablet (5 mg total) by mouth 3 (three) times daily as needed for muscle spasms.  30 tablet  0   No current facility-administered medications on file prior to visit.     Review of Systems  Constitutional: Positive for fever and chills.  HENT: Positive for sore throat and sinus pressure. Negative for rhinorrhea.   Respiratory: Positive for cough. Negative for shortness of breath.   Musculoskeletal:       Generalized body aches  Allergic/Immunologic: Positive for environmental allergies.  Neurological: Positive for headaches.   BP 100/68  Pulse 101  Temp(Src) 98.5 F (36.9 C) (Oral)  Resp 14  Wt 188 lb (85.276 kg)  BMI 28.59 kg/m2  SpO2 96%    Objective:   Physical Exam  Constitutional: She is oriented to person, place, and time. She appears well-developed and well-nourished. No distress.  HENT:  Head: Normocephalic and atraumatic.  Right Ear: External ear normal.  Left Ear: External ear normal.  Mouth/Throat: No oropharyngeal exudate.  Pharyngeal erythema without exudate  Cardiovascular: Normal rate, regular rhythm, normal heart sounds and intact distal pulses.  Exam reveals no gallop and no friction rub.   No murmur heard. Pulmonary/Chest: Effort normal and breath sounds normal. No respiratory distress. She has no wheezes. She has no rales.  Lymphadenopathy:    She has no cervical adenopathy.  Neurological: She is alert and oriented  to person, place, and time.  Skin: Skin is warm and dry.  Psychiatric: She has a normal mood and affect. Her behavior is normal. Judgment and thought content normal.       Assessment & Plan:

## 2012-11-27 NOTE — Patient Instructions (Addendum)
  Your influenza test was negative.  Strep test was  Gargle with salt water for your throat. You can also use chloraseptic spray or lozenges to soothe the throat. Drink plenty of water to stay hydrated.  Take tylenol for fever or general discomfort.  Continue to use the saline nasal spray.

## 2012-11-30 ENCOUNTER — Telehealth: Payer: Self-pay | Admitting: Internal Medicine

## 2012-11-30 NOTE — Telephone Encounter (Signed)
Patient Information:  Caller Name: Abia  Phone: [phone]  Patient: Lynn Mann, Lynn Mann  Gender: Female  DOB: 11-25-1950  Age: 62 Years  PCP: [facility]  Office Follow Up:  Does the office need to follow up with this patient?: Yes  Instructions For The Office: Please contact patient for abdominal pain . No appt available  RN Note:  Advised to take Zantac as directed by quick care. No appt available . Please contact patient  Symptoms  Reason For Call & Symptoms: Patient was seen in the office by Raquel NP for fever and URI. Neg for flu and strep. Started on Azithromycin.  She states on Saturday  11/28/12 she still felt bad and having loose stool and burning in her stomach.  Sunday 11/29/12, Pain began have abdominal discomfort under ribs and left side of abdomen. Intermittent . Went to quick care and was diangnosed with Gastritis. Stop antiobitoc and placed on Zantac.  Today, 11/30/12, abdominal pain "gas bubble feeling" intermittent.. In last few hours under left ribs. She has not taken the zantac today. No stools , no n/v.  Decreased appetite  Reviewed Health History In EMR: Yes  Reviewed Medications In EMR: Yes  Reviewed Allergies In EMR: Yes  Reviewed Surgeries / Procedures: Yes  Date of Onset of Symptoms: 11/27/2012  Guideline(s) Used:  Abdominal Pain - Female  Abdominal Pain - Upper  Disposition Per Guideline:   See Today in Office  Reason For Disposition Reached:   Age > 60 years  Advice Given:  Fluids:   Sip clear fluids only (e.g., water, flat soft drinks, or half-strength fruit juice) until the pain is gone for 2 hours. Then slowly return to a regular diet.  Diet:  Slowly advance diet from clear liquids to a bland diet.  Avoid alcohol or caffeinated beverages.  Avoid greasy or fatty foods.  Avoid NSAIDS and Aspirin  : Avoid any drug that can irritate the stomach lining and make the pain worse (especially aspirin and NSAIDs like ibuprofen).  Antacid:  If having pain  now, try taking an antacid (e.g., Mylanta, Maalox). Dose: 2 tablespoons (30 ml) of liquid by mouth.  Call Back If:  Abdominal pain is constant and present for more than 2 hours.  You become worse.  Patient Will Follow Care Advice:  YES

## 2012-11-30 NOTE — Telephone Encounter (Signed)
Scheduled appt tomorrow at 8am

## 2012-11-30 NOTE — Telephone Encounter (Signed)
She needs evaluation given persistent symptoms.  If acute symptoms now, then recommend to acute care for evaluation.  If no acute sx, I can see her tomorrow am at 8:00 - block .  If having increased abdominal pain, etc - needs eval today.

## 2012-11-30 NOTE — Telephone Encounter (Signed)
Please Advise

## 2012-12-01 ENCOUNTER — Ambulatory Visit: Payer: Self-pay | Admitting: Internal Medicine

## 2012-12-01 ENCOUNTER — Encounter: Payer: Self-pay | Admitting: Internal Medicine

## 2012-12-01 ENCOUNTER — Ambulatory Visit (INDEPENDENT_AMBULATORY_CARE_PROVIDER_SITE_OTHER): Payer: BC Managed Care – PPO | Admitting: Internal Medicine

## 2012-12-01 VITALS — BP 120/80 | HR 91 | Temp 98.3°F | Ht 68.0 in | Wt 185.0 lb

## 2012-12-01 DIAGNOSIS — K589 Irritable bowel syndrome without diarrhea: Secondary | ICD-10-CM

## 2012-12-01 DIAGNOSIS — K8020 Calculus of gallbladder without cholecystitis without obstruction: Secondary | ICD-10-CM

## 2012-12-01 DIAGNOSIS — K219 Gastro-esophageal reflux disease without esophagitis: Secondary | ICD-10-CM

## 2012-12-01 DIAGNOSIS — R109 Unspecified abdominal pain: Secondary | ICD-10-CM

## 2012-12-01 DIAGNOSIS — J069 Acute upper respiratory infection, unspecified: Secondary | ICD-10-CM

## 2012-12-01 LAB — CBC WITH DIFFERENTIAL/PLATELET
Basophils Absolute: 0 10*3/uL (ref 0.0–0.1)
Basophils Relative: 0.3 % (ref 0.0–3.0)
Eosinophils Absolute: 0.1 10*3/uL (ref 0.0–0.7)
Eosinophils Relative: 1.1 % (ref 0.0–5.0)
HCT: 41.3 % (ref 36.0–46.0)
Hemoglobin: 14.1 g/dL (ref 12.0–15.0)
Lymphocytes Relative: 25.4 % (ref 12.0–46.0)
Lymphs Abs: 1.9 10*3/uL (ref 0.7–4.0)
MCHC: 34 g/dL (ref 30.0–36.0)
MCV: 86.7 fl (ref 78.0–100.0)
Monocytes Absolute: 0.4 10*3/uL (ref 0.1–1.0)
Monocytes Relative: 5.2 % (ref 3.0–12.0)
Neutro Abs: 5.1 10*3/uL (ref 1.4–7.7)
Neutrophils Relative %: 68 % (ref 43.0–77.0)
Platelets: 285 10*3/uL (ref 150.0–400.0)
RBC: 4.77 Mil/uL (ref 3.87–5.11)
RDW: 13.3 % (ref 11.5–14.6)
WBC: 7.5 10*3/uL (ref 4.5–10.5)

## 2012-12-01 LAB — BASIC METABOLIC PANEL
BUN: 11 mg/dL (ref 6–23)
CO2: 28 mEq/L (ref 19–32)
Calcium: 9.3 mg/dL (ref 8.4–10.5)
Chloride: 104 mEq/L (ref 96–112)
Creatinine, Ser: 0.7 mg/dL (ref 0.4–1.2)
GFR: 89.18 mL/min (ref >60.00)
Glucose, Bld: 111 mg/dL — ABNORMAL HIGH (ref 70–99)
Potassium: 4.2 mEq/L (ref 3.5–5.1)
Sodium: 140 mEq/L (ref 135–145)

## 2012-12-01 LAB — HEPATIC FUNCTION PANEL
ALT: 18 U/L (ref 0–35)
AST: 23 U/L (ref 0–37)
Albumin: 4.2 g/dL (ref 3.5–5.2)
Alkaline Phosphatase: 63 U/L (ref 39–117)
Bilirubin, Direct: 0.2 mg/dL (ref 0.0–0.3)
Total Bilirubin: 1.7 mg/dL — ABNORMAL HIGH (ref 0.3–1.2)
Total Protein: 7.2 g/dL (ref 6.0–8.3)

## 2012-12-01 LAB — LIPASE: Lipase: 49 U/L (ref 11.0–59.0)

## 2012-12-01 LAB — AMYLASE: Amylase: 56 U/L (ref 27–131)

## 2012-12-01 IMAGING — CT CT ABD-PELV W/ CM
1 of 2 series · 15 of 32 positions shown, 19 images · non-contrast
Comparison: none

REASON FOR EXAM: CALL REPORT [DATE] Cell [PHONE_NUMBER] Generalized Abd
pain Vomiting Tendern...
COMMENTS:

[Series 2: soft tissue · axial · 0.68mm/px · z∈[-933,-516]mm · 15 of 153 slices shown, 19 images]
[im 7/153  soft-tissue]
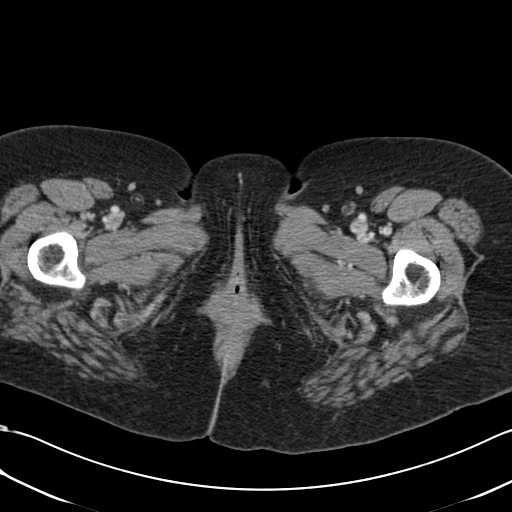
[im 7/153  bone]
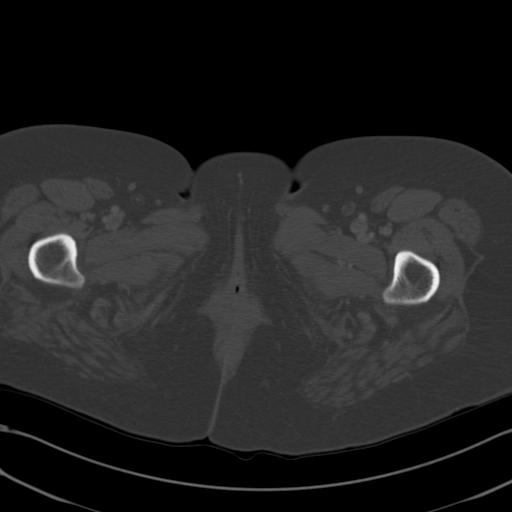
[im 20/153  soft-tissue]
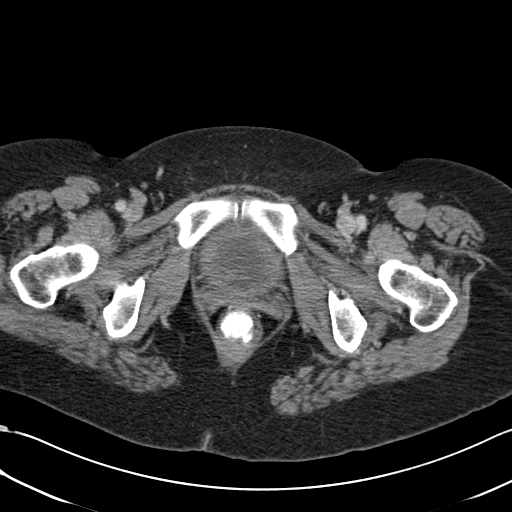
[im 34/153  soft-tissue]
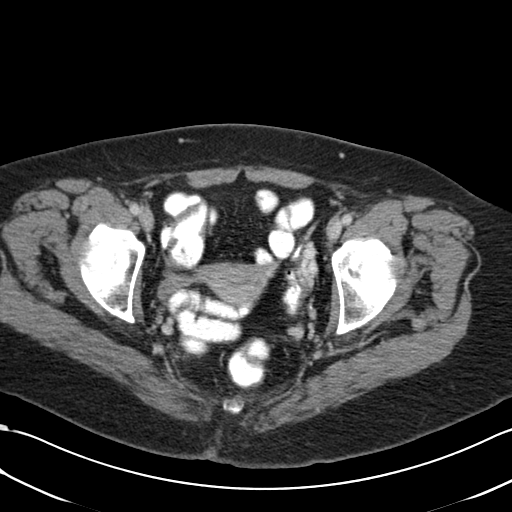
[im 40/153  soft-tissue]
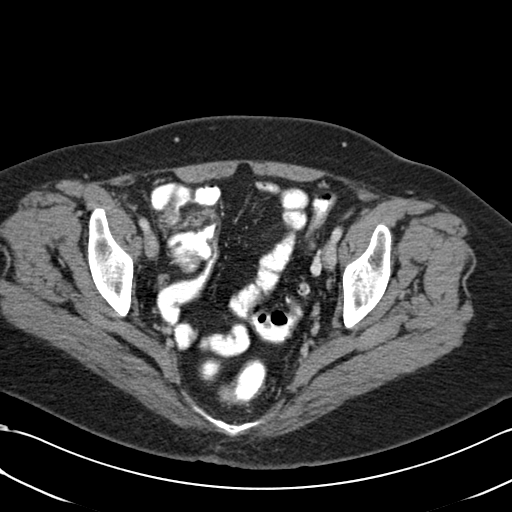
[im 53/153  soft-tissue]
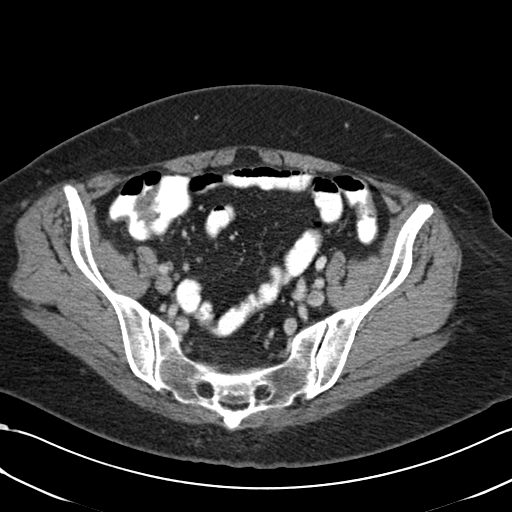
[im 67/153  soft-tissue]
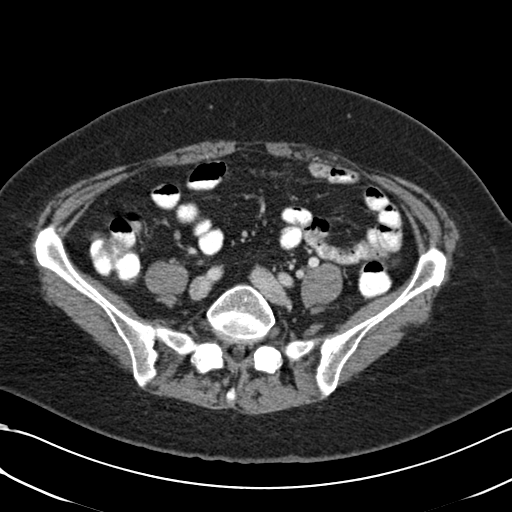
[im 80/153  soft-tissue]
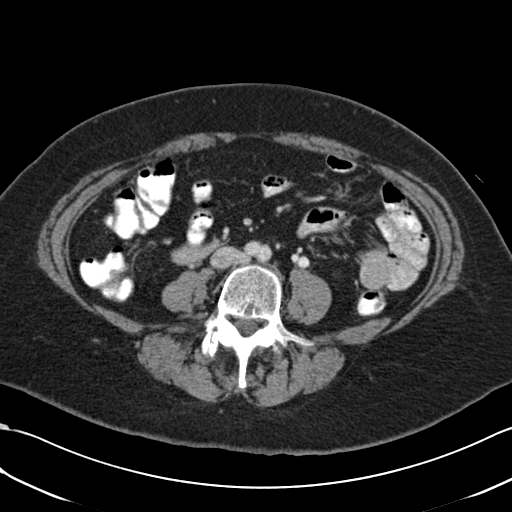
[im 86/153  soft-tissue]
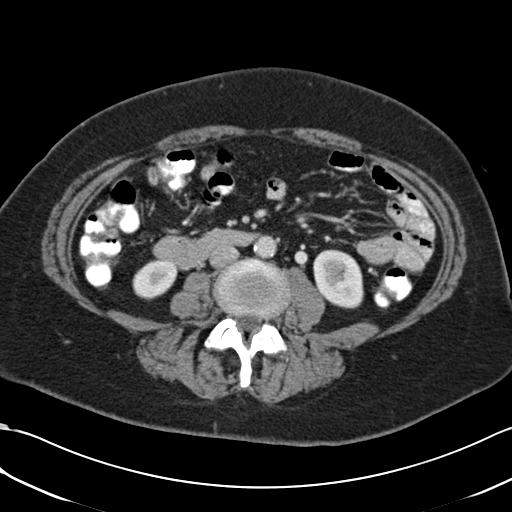
[im 100/153  soft-tissue]
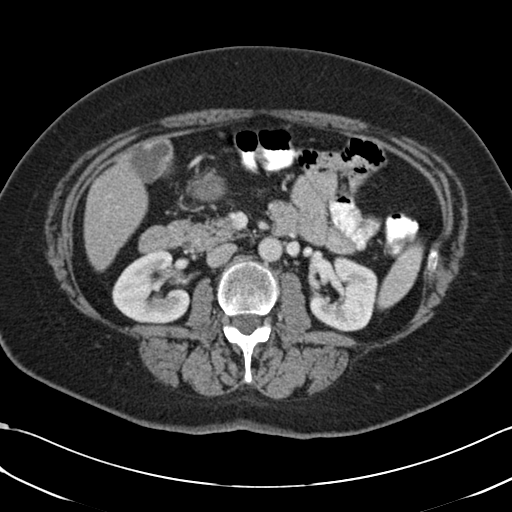
[im 100/153  bone]
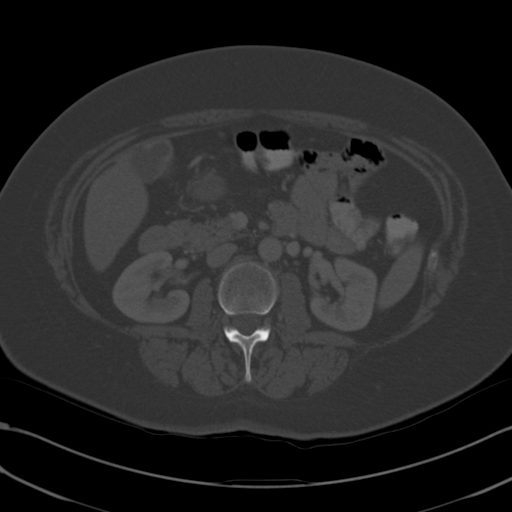
[im 113/153  soft-tissue]
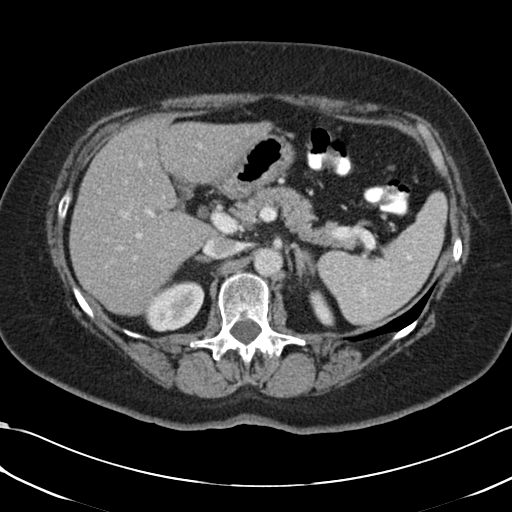
[im 119/153  soft-tissue]
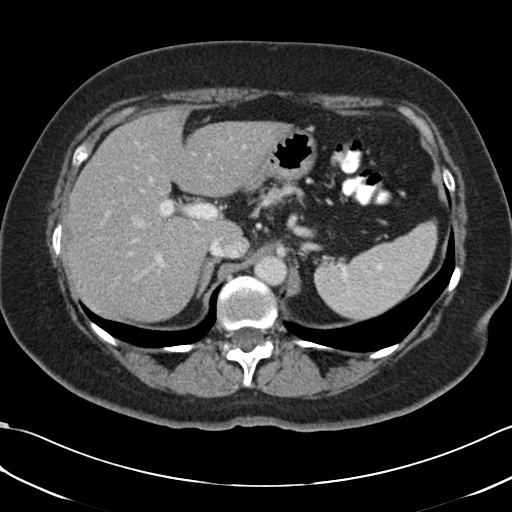
[im 126/153  lung]
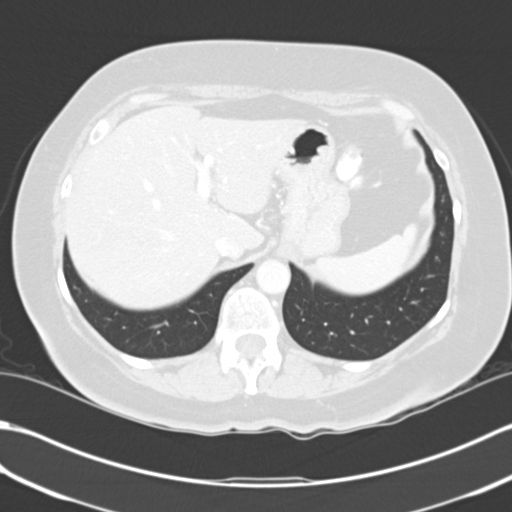
[im 133/153  soft-tissue]
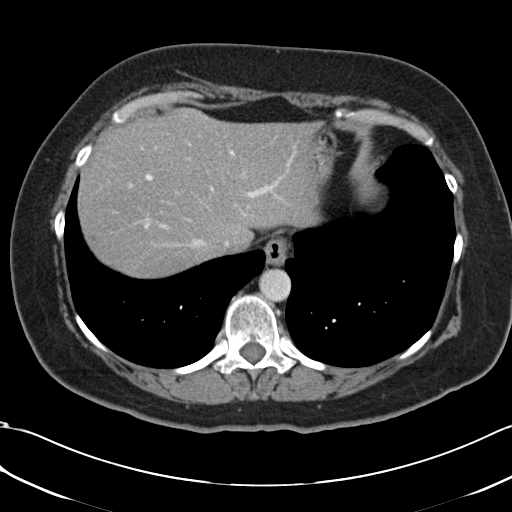
[im 133/153  lung]
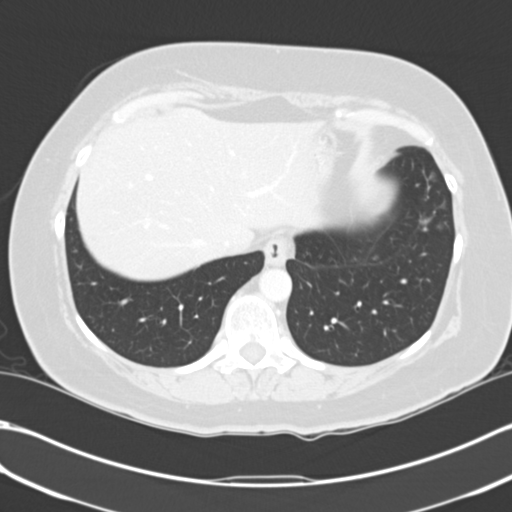
[im 139/153  lung]
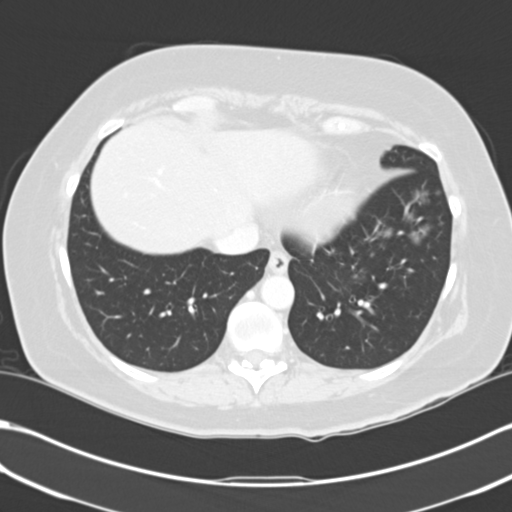
[im 146/153  soft-tissue]
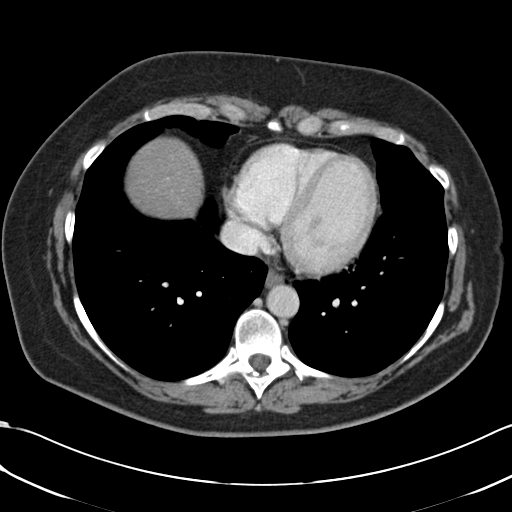
[im 146/153  lung]
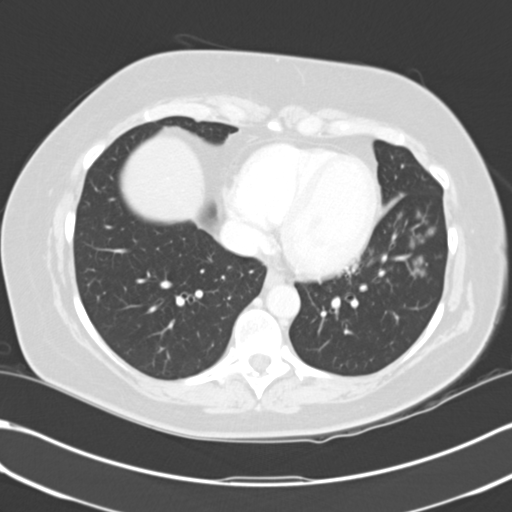

[15 of 32 positions shown; findings below may reference images not displayed]

PROCEDURE:     CT  - CT ABDOMEN / PELVIS  W  - [DATE] [DATE]

RESULT:     CT of the abdomen and pelvis is performed emergently with 100 mL
of [N0] iodinated intravenous contrast and oral contrast. Images are
reconstructed at 3 mm slice thickness in the axial plane. Comparison is made
images from the study dated [DATE].

The lung bases show minimal patchy density on the first image in the left
lower lobe anterolaterally with some additional similar areas along the
inferior left lung and anterior basal segment suggestive of underlying air
space disease such as early pneumonia. Correlate clinically. Oral contrast
does reach the rectum. There is no abnormal bowel distention. The appendix
appears normal. The stomach wall appears to be slightly thickened but the
stomach is nondistended and this may be artifactual. Correlate for
gastritis. The pancreas shows no surrounding inflammation. No radiopaque
gallstones are evident. The liver enhances normally. The spleen is
unremarkable. The kidneys show no definite mass or hydronephrosis. There is
no adrenal gland. The abdominal aorta appears to be unremarkable with some
scattered atherosclerotic calcification present. There is no adenopathy.
Urinary bladder contains a small amount of urine. The uterus is present. No
adnexal masses appreciated. The bony structures appear unremarkable.
IMPRESSION: 1. Patchy airspace disease in the left lower lobe concerning for pneumonia.
2. No acute abdominal or pelvic abnormality appreciated. There may be mild
thickening of the wall of the stomach but this could be artifactual given
the nondistended state. Correlate for possible gastritis. Normal appearing
appendix.

[REDACTED]

## 2012-12-01 MED ORDER — ALBUTEROL SULFATE HFA 108 (90 BASE) MCG/ACT IN AERS: 2.0000 | INHALATION_SPRAY | Freq: Four times a day (QID) | RESPIRATORY_TRACT | Status: DC | PRN

## 2012-12-01 MED ORDER — PANTOPRAZOLE SODIUM 40 MG PO TBEC: 40.0000 mg | DELAYED_RELEASE_TABLET | Freq: Every day | ORAL | Status: DC

## 2012-12-01 MED ORDER — CEFDINIR 300 MG PO CAPS: 300.0000 mg | ORAL_CAPSULE | Freq: Two times a day (BID) | ORAL | Status: DC

## 2012-12-01 NOTE — Patient Instructions (Signed)
Take protonix one per day.  Take 30 minutes before breakfast.  We will call your with your lab results.

## 2012-12-01 NOTE — Progress Notes (Signed)
Subjective:    Patient ID: Lynn Mann, female    DOB: [birth year], 62 y.o.   MRN: [id number]  Abdominal Pain  62 year old female with past history of MVP, rheumatic fever, and IBS who comes in today as a work in with concerns regarding abdominal pain, nausea and bowel change.  History remarkable for and episode of back pain in 8/13.  Was found to have gallstones.  Was evaluated by Dr Maxcine Ham ([facility] - surgery).  Did not recommend surgery.  Also has a history of IBS.  Had a colonoscopy 2007.  Was seen 11/27/12.  Diagnosed with URI.  Treated with Zpak.  The following day, she was still having increased cough so throughout the day - she took 3-4 mucinex DM and several aspirin.  She developed lower abdominal pain and discomfort.  She also developed some burning - increased acid. Then developed some diarrhea and loose stool.  This continued to progress the following day with increased gas.  The loose stool subsided.  She went to acute care at [facility] on Sunday and was told she had gastritis.  Was placed on zantac and the zpak was stopped.  She is still having some intermittent episodes of abdominal pain and cramping.  Had a bowel movement this am - soft.  No blood.  Some nausea.  She is trying to eat bland foods.  States the pain is diffuse and is radiating up and down her abdomen.    Past Medical History  Diagnosis Date  . Arthritis   . Chicken pox   . Rheumatic fever   . MRSA exposure 2005    Spider bite  . Cholecystitis 11/2011    Did not require sgy - Dr. Sammuel Cooper - [facility]  (cholelithiasis)  . MVP (mitral valve prolapse)     Stable - Dr. Lady Gary  . Hypertension   . IBS (irritable bowel syndrome)   . H/O Clostridium difficile infection     Current Outpatient Prescriptions on File Prior to Visit  Medication Sig Dispense Refill  . HYDROcodone-acetaminophen (NORCO/VICODIN) 5-325 MG per tablet Take 1 tablet by mouth every 6 (six) hours as needed.       No current facility-administered medications on  file prior to visit.    Review of Systems  Gastrointestinal: Positive for abdominal pain.  Patient denies any headache, lightheadedness or dizziness.  No sinus or allergy symptoms.  She still reports increased cough.  No sob.  Did have the burning and acid reflux as outlined.  No dysphagia.  One episode of emesis.  Some nausea.  Some abdominal pain and cramping as outlined.  Continues. Intermittent.  Not as bad as a couple of days ago.   No urine change.  Soft bowel movement this am. No blood.  Low grade fever - Tmax 100.4.      Objective:   Physical Exam  Filed Vitals:   12/01/12 0806  BP: 120/80  Pulse: 91  Temp: 98.3 F (51.69 C)   62 year old female in no acute distress.   HEENT:  Nares- clear.  Oropharynx - without lesions. NECK:  Supple.  Nontender.  No audible bruit.  Increased fullness right lateral neck (question of increased lymphadenopathy).  Non tender.  HEART:  Appears to be regular. LUNGS:  No crackles or wheezing audible.  Respirations even and unlabored.  RADIAL PULSE:  Equal bilaterally.  ABDOMEN:  Soft.  Diffuse tenderness to palpation over the abdomen.  No rebound or guarding.  Bowel sounds present and normal.  No audible abdominal bruit.    EXTREMITIES:  No increased edema present.  DP pulses palpable and equal bilaterally.      SKIN:  No rash.      Assessment & Plan:  ABDOMINAL PAIN.  Symptoms and exam as outlined.  Will check cbc, met b, liver function and amylase/lipase.  Will also obtain CT abdomen/pelvis.  Has known gallstones.  Describes a more diffuse pain and more localized tenderness - left lower abdomen.  Bowel change as outlined.  Further w/up pending results of CT and lab.  Bland foods.  Start Protonix.  Follow closely.  She was informed if symptoms changed or worsened - she was to be reevaluated immediately.    LYMPHADENOPATHY.  Increased fullness right lateral neck. Needs further w/up.  Will treat acute problem and then pursue further w/up.    HEALTH  MAINTENANCE.  Overdue a physical.  Schedule a physical for next visit.

## 2012-12-03 ENCOUNTER — Telehealth: Payer: Self-pay | Admitting: Internal Medicine

## 2012-12-03 ENCOUNTER — Encounter: Payer: Self-pay | Admitting: Internal Medicine

## 2012-12-03 DIAGNOSIS — K219 Gastro-esophageal reflux disease without esophagitis: Secondary | ICD-10-CM | POA: Insufficient documentation

## 2012-12-03 NOTE — Assessment & Plan Note (Signed)
Last colonoscopy 2007.

## 2012-12-03 NOTE — Telephone Encounter (Signed)
LMTCB

## 2012-12-03 NOTE — Telephone Encounter (Signed)
Pt called and wanted to know if she could take imodium with all the other meds that she is taking please advise ASAP Pt has mammogram and 1 this is the reason she wanted to take  imodium

## 2012-12-03 NOTE — Assessment & Plan Note (Signed)
Increased acid and reflux as outlined.  Start protonix.  Avoid increased aspirin and antiinflammatories.

## 2012-12-03 NOTE — Assessment & Plan Note (Signed)
Is off zpak.  Still with coughing.  Robitussin DM as outlined.

## 2012-12-03 NOTE — Telephone Encounter (Signed)
Pt notified of CT results.  On protonix.  [employer] called in for pneumonia.  Bland foods.  Stay hydrated.  She will call with update in 1-2 days.  Will be evaluated if symptoms worsen or change.

## 2012-12-03 NOTE — Telephone Encounter (Signed)
I would prefer her take kaopectate - after each loose stool not to exceed 6 doses ina 24 hour period.  Have symptoms improved at all.  How much diarrhea is she having.  Is she taking align - one per day.

## 2012-12-03 NOTE — Assessment & Plan Note (Signed)
Has a history of gallstones.  Saw Dr Maxcine Ham ([facility]).  Decided did not need cholecystectomy.  Symptoms as outlined.  Obtain CT.  Further w/up pending.

## 2012-12-22 ENCOUNTER — Telehealth: Payer: Self-pay | Admitting: Internal Medicine

## 2012-12-22 NOTE — Telephone Encounter (Signed)
Pt.notified

## 2012-12-22 NOTE — Telephone Encounter (Signed)
Pt called stating she has appointment with dr Lady Gary @ [facility] 6/11.  Pt wanted to see if you thought she needed to keep this appointment and if you received any records from dr fath office it would of been 1 year her last office with him. All test when she was there was ok.

## 2012-12-22 NOTE — Telephone Encounter (Signed)
Yes, I would keep this appt with Dr Lady Gary and can then discuss with him regarding need for continued follow up.

## 2012-12-24 ENCOUNTER — Telehealth: Payer: Self-pay | Admitting: Internal Medicine

## 2012-12-25 ENCOUNTER — Encounter: Payer: Self-pay | Admitting: Internal Medicine

## 2012-12-27 ENCOUNTER — Encounter: Payer: Self-pay | Admitting: Internal Medicine

## 2012-12-27 DIAGNOSIS — Z86010 Personal history of colonic polyps: Secondary | ICD-10-CM

## 2013-01-18 ENCOUNTER — Ambulatory Visit: Payer: Self-pay | Admitting: Internal Medicine

## 2013-01-18 ENCOUNTER — Encounter: Payer: Self-pay | Admitting: Internal Medicine

## 2013-01-18 ENCOUNTER — Other Ambulatory Visit (HOSPITAL_COMMUNITY)
Admission: RE | Admit: 2013-01-18 | Discharge: 2013-01-18 | Disposition: A | Discharge status: Home / Self Care | Payer: BC Managed Care – PPO | Source: Ambulatory Visit | Attending: Internal Medicine | Admitting: Internal Medicine

## 2013-01-18 ENCOUNTER — Ambulatory Visit (INDEPENDENT_AMBULATORY_CARE_PROVIDER_SITE_OTHER): Payer: BC Managed Care – PPO | Admitting: Internal Medicine

## 2013-01-18 DIAGNOSIS — E7800 Pure hypercholesterolemia, unspecified: Secondary | ICD-10-CM

## 2013-01-18 DIAGNOSIS — Z01419 Encounter for gynecological examination (general) (routine) without abnormal findings: Secondary | ICD-10-CM | POA: Insufficient documentation

## 2013-01-18 DIAGNOSIS — Z124 Encounter for screening for malignant neoplasm of cervix: Secondary | ICD-10-CM

## 2013-01-18 DIAGNOSIS — K589 Irritable bowel syndrome without diarrhea: Secondary | ICD-10-CM

## 2013-01-18 DIAGNOSIS — R17 Unspecified jaundice: Secondary | ICD-10-CM

## 2013-01-18 DIAGNOSIS — Z8679 Personal history of other diseases of the circulatory system: Secondary | ICD-10-CM

## 2013-01-18 DIAGNOSIS — Z8619 Personal history of other infectious and parasitic diseases: Secondary | ICD-10-CM

## 2013-01-18 DIAGNOSIS — K219 Gastro-esophageal reflux disease without esophagitis: Secondary | ICD-10-CM

## 2013-01-18 DIAGNOSIS — Z0000 Encounter for general adult medical examination without abnormal findings: Secondary | ICD-10-CM

## 2013-01-18 DIAGNOSIS — R198 Other specified symptoms and signs involving the digestive system and abdomen: Secondary | ICD-10-CM

## 2013-01-18 DIAGNOSIS — R220 Localized swelling, mass and lump, head: Secondary | ICD-10-CM

## 2013-01-18 DIAGNOSIS — K8020 Calculus of gallbladder without cholecystitis without obstruction: Secondary | ICD-10-CM

## 2013-01-18 DIAGNOSIS — R221 Localized swelling, mass and lump, neck: Secondary | ICD-10-CM

## 2013-01-18 DIAGNOSIS — I341 Nonrheumatic mitral (valve) prolapse: Secondary | ICD-10-CM

## 2013-01-18 DIAGNOSIS — I059 Rheumatic mitral valve disease, unspecified: Secondary | ICD-10-CM

## 2013-01-18 DIAGNOSIS — Z1151 Encounter for screening for human papillomavirus (HPV): Secondary | ICD-10-CM | POA: Insufficient documentation

## 2013-01-18 DIAGNOSIS — J189 Pneumonia, unspecified organism: Secondary | ICD-10-CM

## 2013-01-18 LAB — HEPATIC FUNCTION PANEL
ALT: 22 U/L (ref 0–35)
AST: 28 U/L (ref 0–37)
Albumin: 4.3 g/dL (ref 3.5–5.2)
Alkaline Phosphatase: 62 U/L (ref 39–117)
Bilirubin, Direct: 0.2 mg/dL (ref 0.0–0.3)
Total Bilirubin: 1.1 mg/dL (ref 0.3–1.2)
Total Protein: 7.2 g/dL (ref 6.0–8.3)

## 2013-01-18 LAB — LDL CHOLESTEROL, DIRECT: Direct LDL: 124.3 mg/dL

## 2013-01-18 LAB — LIPID PANEL
Cholesterol: 208 mg/dL — ABNORMAL HIGH (ref 0–200)
HDL: 38.9 mg/dL — ABNORMAL LOW
Total CHOL/HDL Ratio: 5
Triglycerides: 245 mg/dL — ABNORMAL HIGH (ref 0.0–149.0)
VLDL: 49 mg/dL — ABNORMAL HIGH (ref 0.0–40.0)

## 2013-01-18 IMAGING — CR DG CHEST 2V
1 series · 2 of 2 positions shown · non-contrast
Comparison: none

REASON FOR EXAM: f/u abn ct scan [DATE] fax [PHONE_NUMBER]
COMMENTS:

PROCEDURE:     DXR - DXR CHEST PA (OR AP) AND LATERAL  - [DATE]  [DATE]
RESULT:     The lungs are clear. The heart and pulmonary vessels are normal.
The bony and mediastinal structures are unremarkable. There is no effusion.
There is no pneumothorax or evidence of congestive failure.

[Series 1: w chest pa · 0.14mm/px · 2 of 2 slices shown]
[im 1/2]
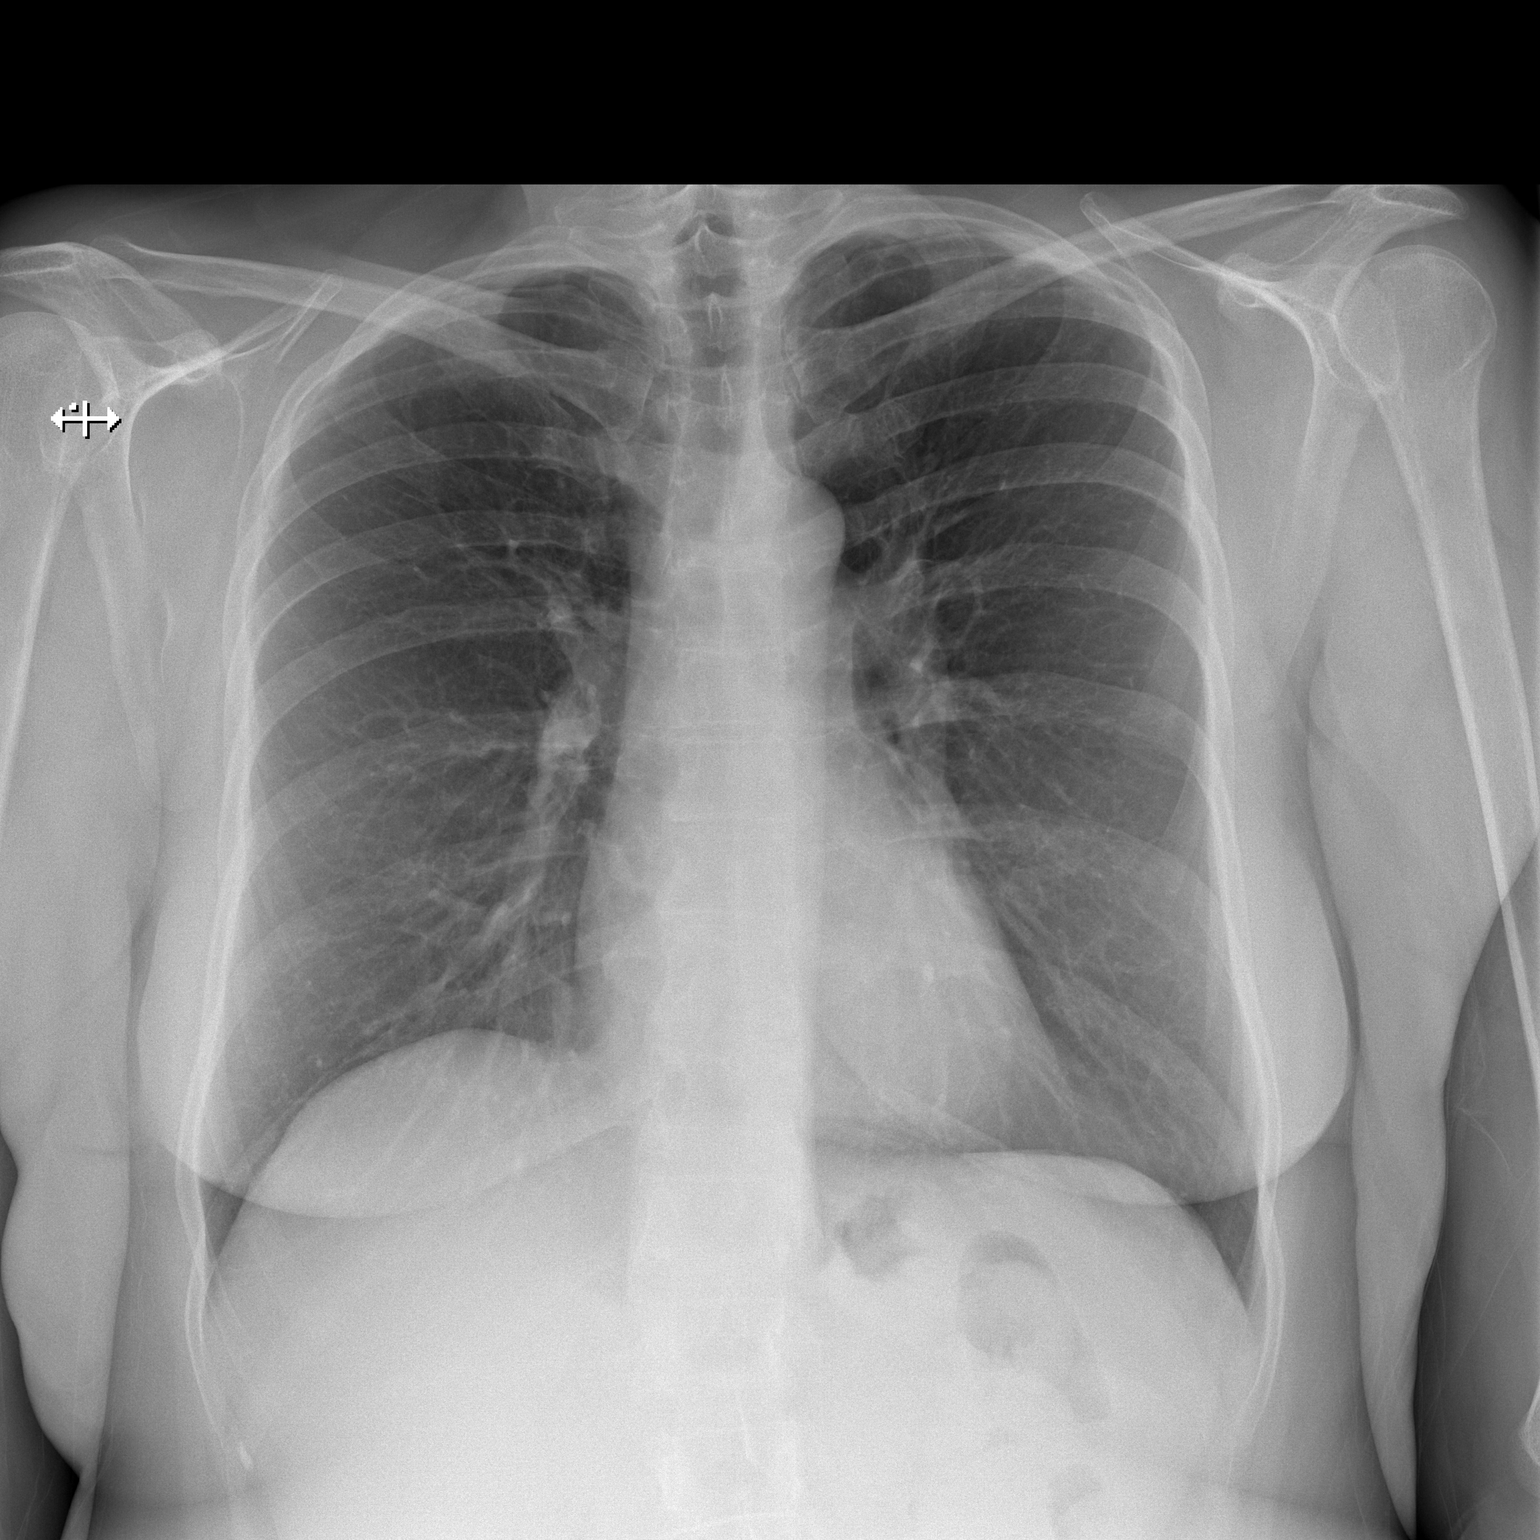
[im 2/2]
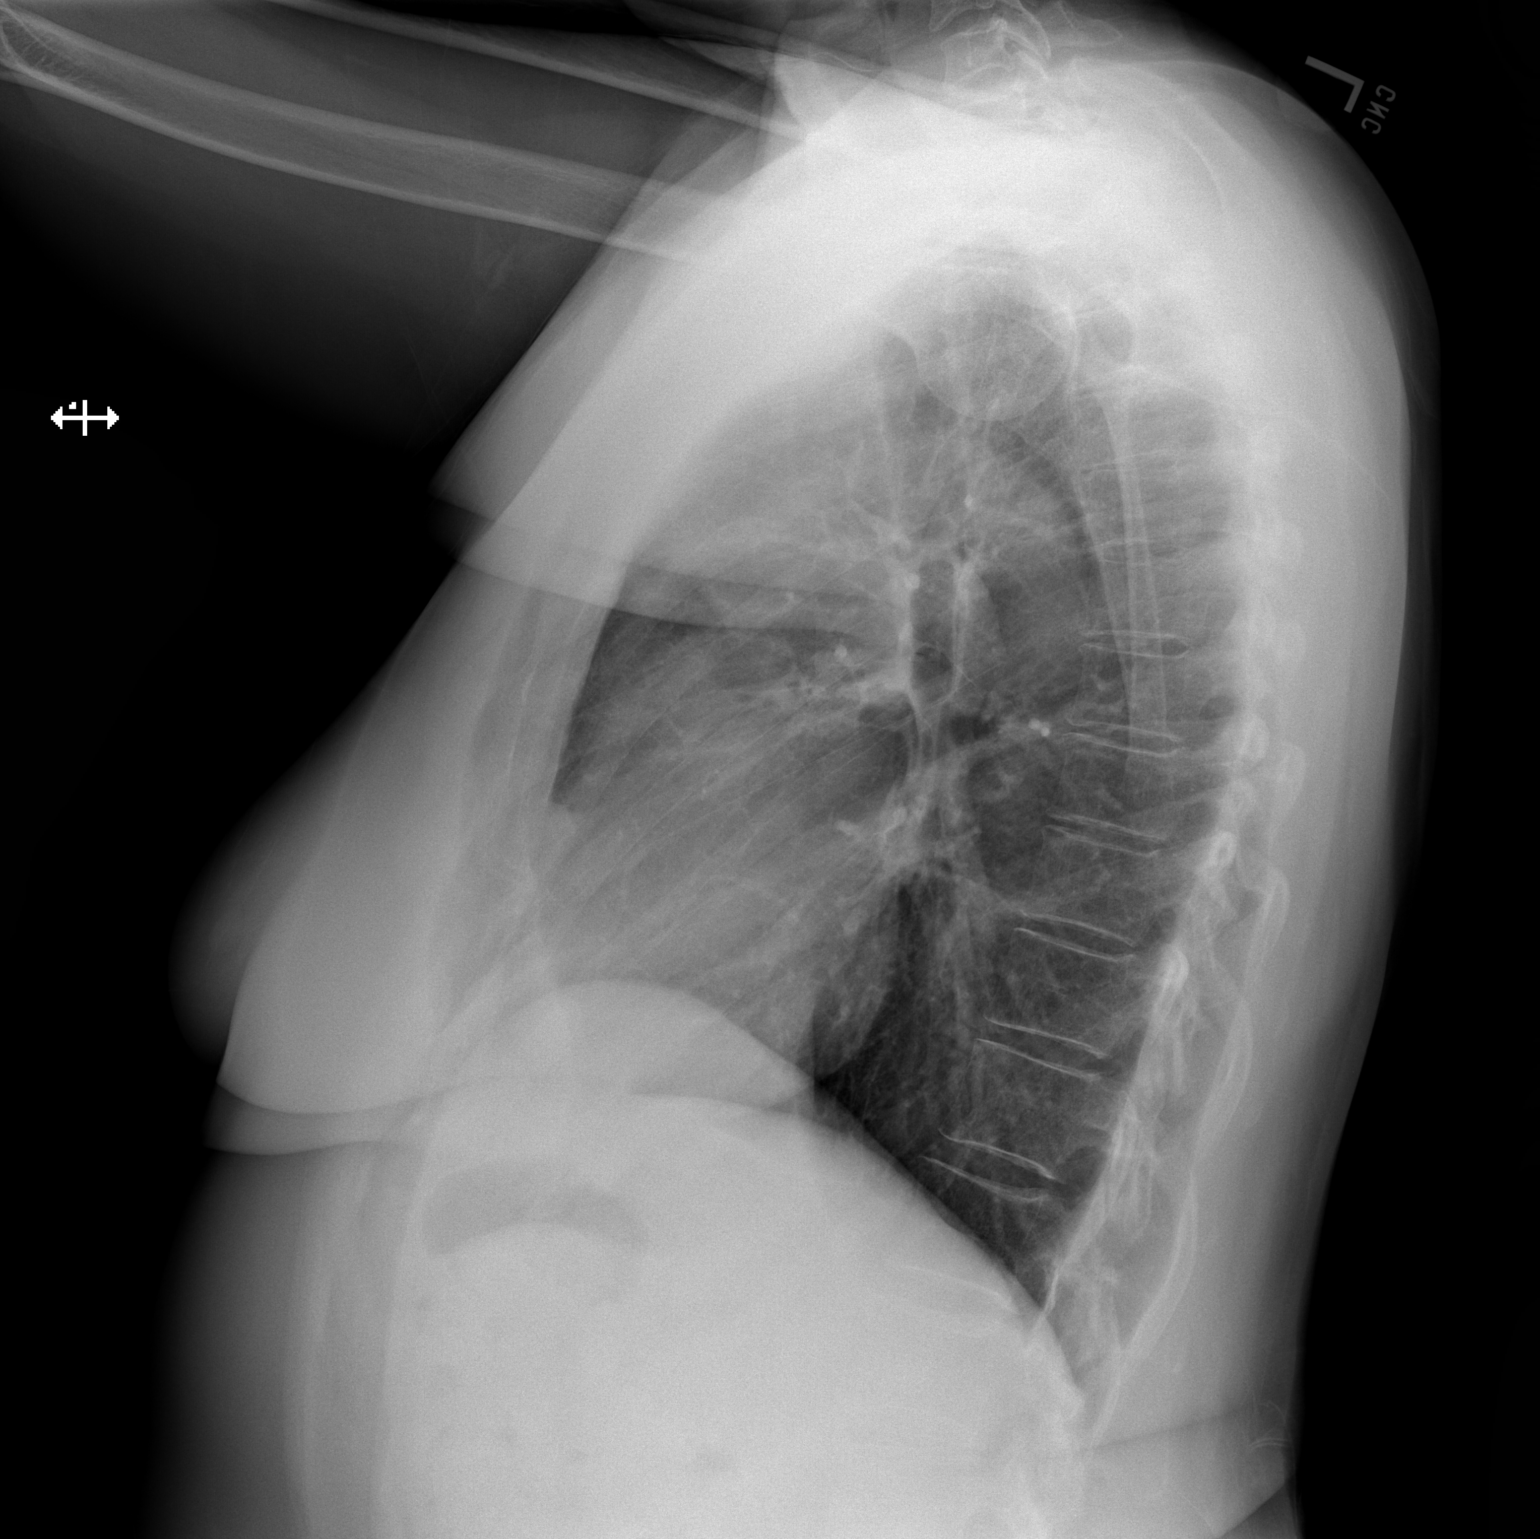

[2 of 2 positions shown; findings below may reference images not displayed]

IMPRESSION: No acute cardiopulmonary disease. Stable appearance
compared to [DATE].

[REDACTED]

## 2013-01-18 NOTE — Assessment & Plan Note (Signed)
Recheck liver panel today to confirm wnl.

## 2013-01-18 NOTE — Assessment & Plan Note (Signed)
Has a history of MVP.  Sees Dr Lady Gary.  Just evaluated.  Obtain records.

## 2013-01-18 NOTE — Progress Notes (Signed)
Subjective:    Patient ID: Lynn Mann, female    DOB: [birth year], 62 y.o.   MRN: [id number]  Abdominal Pain  62 year old female with past history of MVP, rheumatic fever, and IBS who comes in today to follow up on these issues as well as for a complete physical exam.   History remarkable for an episode of back pain in 8/13.  Was found to have gallstones.  Was evaluated by Dr Maxcine Ham ([facility] - surgery).  Did not recommend surgery.  Also has a history of IBS.  Had a colonoscopy 2007.  Was seen by me 12/01/12 for abdominal pain.  Refer to that note for details.   CT obtained and revealed possible wall thickening in the stomach (possible gastritis).  She was also noted to have patchy airspace disease in the lung - felt to be c/w pneumonia. Was treated with abx and also started on Protonix.  She states she feels better, but still has some increased gas and bloating.  Some nausea yesterday am.  Took a laxative yesterday.  Has had three good size bowel movements since the laxative.  Still feels bloated. Not taking protonix.  Did not feel it helped.  She does report some increased fatigue.  Gets more winded with exertion.  Just saw Dr Lady Gary.  Had EKG - ok.  Had a treadmill stress test within the last year.  Negative.  Eating and drinking well.       Past Medical History  Diagnosis Date  . Arthritis   . Chicken pox   . Rheumatic fever   . MRSA exposure 2005    Spider bite  . Cholecystitis 11/2011    Did not require sgy - Dr. Sammuel Cooper - [facility]  (cholelithiasis)  . MVP (mitral valve prolapse)     Stable - Dr. Lady Gary  . Hypertension   . IBS (irritable bowel syndrome)   . H/O Clostridium difficile infection     Current Outpatient Prescriptions on File Prior to Visit  Medication Sig Dispense Refill  . albuterol (PROVENTIL HFA;VENTOLIN HFA) 108 (90 BASE) MCG/ACT inhaler Inhale 2 puffs into the lungs every 6 (six) hours as needed for wheezing.  1 Inhaler  0  . HYDROcodone-acetaminophen (NORCO/VICODIN) 5-325 MG  per tablet Take 1 tablet by mouth every 6 (six) hours as needed.       No current facility-administered medications on file prior to visit.    Review of Systems  Gastrointestinal: Positive for abdominal pain.  Patient denies any headache, lightheadedness or dizziness.  No sinus or allergy symptoms. No sore throat.  No significant cough or congestion.  No sob.  Does report the windedness with exertion.  No acid reflux now.  Off protonix.  Did not feel it helped.  No dysphagia.  Some nausea.  Some abdominal bloating.  Continues. Intermittent.  No urine change.       Objective:   Physical Exam  Filed Vitals:   01/18/13 1056  BP: 110/70  Pulse: 78  Temp: 98.6 F (41 C)   62 year old female in no acute distress.   HEENT:  Nares- clear.  Oropharynx - without lesions. NECK:  Supple.  Nontender.  No audible bruit.  HEART:  Appears to be regular. LUNGS:  No crackles or wheezing audible.  Respirations even and unlabored.  RADIAL PULSE:  Equal bilaterally.    BREASTS:  No nipple discharge or nipple retraction present.  Could not appreciate any distinct nodules or axillary adenopathy.  ABDOMEN:  Soft.  Minimal epigastric tenderness to palpation.  No rebound or guarding.  Bowel sounds present and normal.  No audible abdominal bruit.  GU:  Normal external genitalia.  Vaginal vault without lesions.  Cervix identified.  Pap performed. Could not appreciate any adnexal masses or tenderness.   RECTAL:  Heme negative.   EXTREMITIES:  No increased edema present.  DP pulses palpable and equal bilaterally.          Assessment & Plan:  GI.  With abdominal pain and bloating as outlined.  Symptoms and exam as outlined.  CT as outlined.  Did not feel the protonix helped.  Will refer to GI for evaluation and question of need for colonoscopy and EGD.  Further w/up pending their assessment.    LYMPHADENOPATHY.  Increased fullness right lateral neck. No significant change with abx treatment.  Refer to ENT for  evaluation.    HEALTH MAINTENANCE.  Physical today.  Colonoscopy 2007.  Refer to GI as outlined.  Mammogram 12/13/12 [facility].

## 2013-01-18 NOTE — Assessment & Plan Note (Signed)
Avoid increased aspirin and antiinflammatories.  Reports no acid reflux now.  Off protonix.  Did not feel it helped.  Follow.   

## 2013-01-18 NOTE — Assessment & Plan Note (Signed)
CT scan as outlined.  Treated with abx.  No increased cough or congestion now.  Recheck CXR and compare to CT.  Further w/up pending.

## 2013-01-18 NOTE — Assessment & Plan Note (Signed)
Last colonoscopy 2007.  GI symptoms as outlined.  Refer to GI for evaluation and question of need for colonoscopy and EGD.

## 2013-01-18 NOTE — Assessment & Plan Note (Signed)
Has a history of gallstones.  Saw Dr Maxcine Ham ([facility]).  Decided did not need cholecystectomy.  Symptoms as outlined.  CT as outlined.  Refer to GI for further w/up.

## 2013-01-18 NOTE — Assessment & Plan Note (Signed)
States had normal stress test within the last year.  Also reports ECHO - ok.  Just saw Dr Lady Gary in follow up.  Stable.

## 2013-01-19 ENCOUNTER — Encounter: Payer: Self-pay | Admitting: *Deleted

## 2013-01-20 ENCOUNTER — Encounter: Payer: Self-pay | Admitting: *Deleted

## 2013-02-11 ENCOUNTER — Encounter: Payer: Self-pay | Admitting: Internal Medicine

## 2013-04-09 ENCOUNTER — Ambulatory Visit: Payer: BC Managed Care – PPO | Admitting: Internal Medicine

## 2013-04-27 ENCOUNTER — Encounter: Payer: Self-pay | Admitting: Adult Health

## 2013-04-27 ENCOUNTER — Ambulatory Visit (INDEPENDENT_AMBULATORY_CARE_PROVIDER_SITE_OTHER): Payer: BC Managed Care – PPO | Admitting: Adult Health

## 2013-04-27 VITALS — BP 110/62 | HR 94 | Temp 98.4°F | Resp 12 | Wt 184.0 lb

## 2013-04-27 DIAGNOSIS — J029 Acute pharyngitis, unspecified: Secondary | ICD-10-CM

## 2013-04-27 DIAGNOSIS — R509 Fever, unspecified: Secondary | ICD-10-CM

## 2013-04-27 LAB — POCT INFLUENZA A/B
Influenza A, POC: NEGATIVE
Influenza B, POC: NEGATIVE

## 2013-04-27 LAB — POCT RAPID STREP A (OFFICE): Rapid Strep A Screen: NEGATIVE

## 2013-04-27 MED ORDER — AZITHROMYCIN 250 MG PO TABS: ORAL_TABLET | ORAL | Status: DC

## 2013-04-27 NOTE — Patient Instructions (Addendum)
  Start the Azithromycin today as directed.  Drink fluids to stay hydrated.  Continue tea with honey.  You can try chloraseptic spray or lozenges for your throat  If no improvement within 4-5 days, please call.

## 2013-04-27 NOTE — Progress Notes (Signed)
  Subjective:    Patient ID: Lynn Mann, female    DOB: [birth year], 62 y.o.   MRN: [id number]  HPI  Pt woke up Thursday morning with sore throat and fever, went to [facility] acute care that day and had a negative strep test and was told it was viral and to treat symptomatically with rest and fluids.  Pt states Sunday, she developed cloudiness in her right eye and went to her eye doctor and was told she had "a cold in her eye" and given abx eye drops.  Pt states she continues to have sore throat, fatigue, and fever (highest 101).  Pt has been taking Ibuprofen and Tylenol for fever, last dose at noon yesterday.    Review of Systems  Constitutional: Positive for fever and fatigue.  HENT: Positive for sore throat, postnasal drip and sinus pressure.   Eyes: Positive for discharge.       Right eye d/c on Sunday  Respiratory: Positive for cough. Negative for shortness of breath and wheezing.   Cardiovascular: Negative.   Musculoskeletal: Negative.   Skin: Negative.   Neurological: Positive for headaches.  Psychiatric/Behavioral: Negative.        Objective:   Physical Exam  Constitutional: She is oriented to person, place, and time. She appears well-developed and well-nourished.  HENT:  Head: Normocephalic and atraumatic.  Right Ear: Tympanic membrane, external ear and ear canal normal.  Left Ear: Tympanic membrane, external ear and ear canal normal.  Mouth/Throat: Posterior oropharyngeal erythema present.  Small white patches on tonsils  Eyes: Right eye exhibits no discharge. Left eye exhibits no discharge.  Neck: Normal range of motion.  Cardiovascular: Normal rate, regular rhythm and normal heart sounds.  Exam reveals no gallop and no friction rub.   No murmur heard. Pulmonary/Chest: Effort normal and breath sounds normal. No respiratory distress. She has no wheezes.  Scattered rhonchi - clears with cough  Lymphadenopathy:    She has no cervical adenopathy.  Neurological: She is alert  and oriented to person, place, and time.  Skin: Skin is warm and dry.  Psychiatric: She has a normal mood and affect. Her behavior is normal. Judgment and thought content normal.    BP 110/62  Pulse 94  Temp(Src) 98.4 F (36.9 C) (Oral)  Resp 12  Wt 184 lb (83.462 kg)  BMI 28.81 kg/m2  SpO2 97%       Assessment & Plan:

## 2013-04-27 NOTE — Assessment & Plan Note (Signed)
Sore throat negative for strep. Some rhonchi scattered. Start Azithromycin. RTC if no improvement 4-5 days

## 2013-05-03 ENCOUNTER — Telehealth: Payer: Self-pay | Admitting: Internal Medicine

## 2013-05-03 NOTE — Telephone Encounter (Signed)
Continue with OTC cough medication such as Delsym or Robitussin DM for cough. Voice rest. Drink fluids to stay hydrated.

## 2013-05-03 NOTE — Telephone Encounter (Signed)
Pt was seen by Raquel recently and she says she still is not completely better after her z-pak ???

## 2013-05-03 NOTE — Telephone Encounter (Signed)
Spoke with pt, has completed her antibiotic. States continues to have fatigue, hoarse, and a non productive cough. States cough has improved but has not gone away. States occasional low grade fever, 98.8 axillary, which she states is high for her. Using saline nasal spray and OTC cough suppressants with some relief.

## 2013-05-04 NOTE — Telephone Encounter (Signed)
Pt notified and verbalized understanding. Advised to call back with persistence of symptoms.

## 2013-05-11 DIAGNOSIS — Z86010 Personal history of colonic polyps: Secondary | ICD-10-CM | POA: Insufficient documentation

## 2013-05-25 ENCOUNTER — Encounter: Payer: Self-pay | Admitting: Internal Medicine

## 2013-05-25 ENCOUNTER — Ambulatory Visit: Payer: Self-pay | Admitting: Otolaryngology

## 2013-05-25 IMAGING — US THYROID ULTRASOUND
1 series · 14 of 15 positions shown · non-contrast
Comparison: none

Addendum Begins
REASON FOR EXAM: RT sided neck swelling
COMMENTS:

[Series 1: thyroid ultrasound · 0.07mm/px · 14 of 15 slices shown]
[im 1/15]
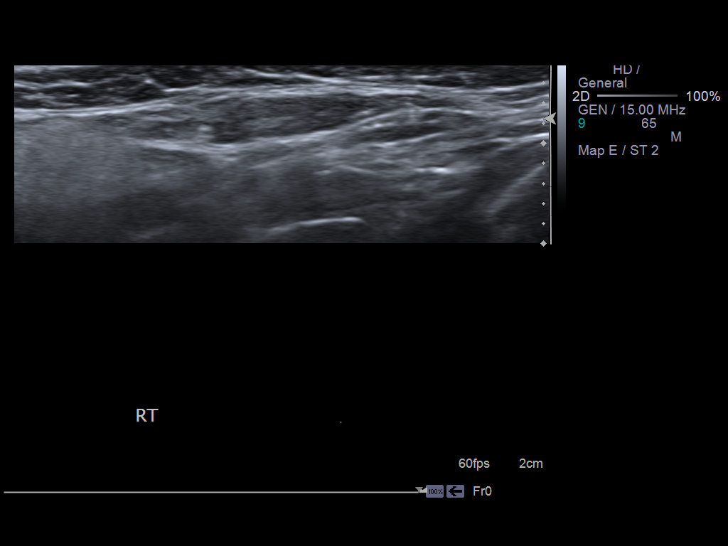
[im 2/15]
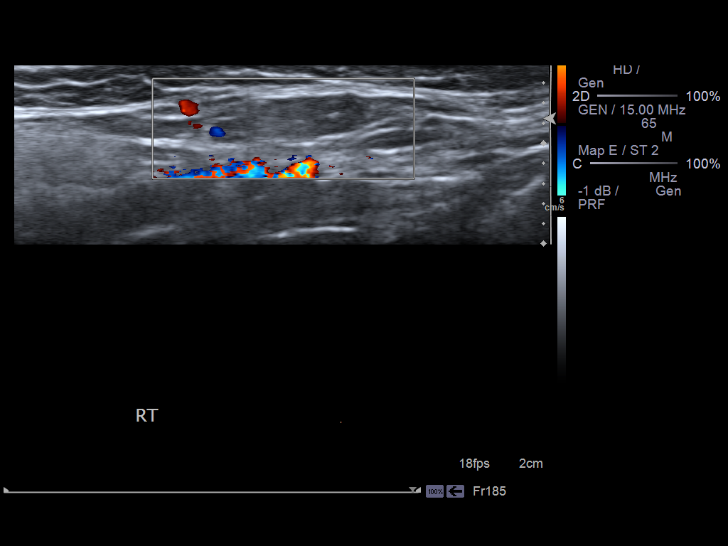
[im 3/15]
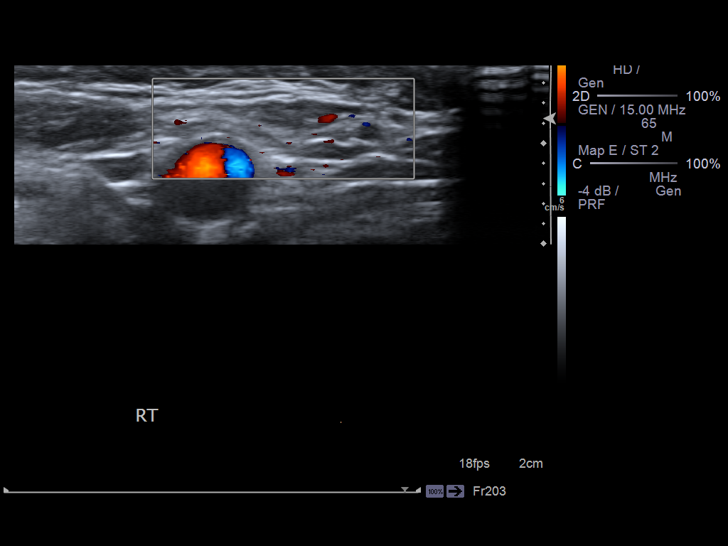
[im 4/15]
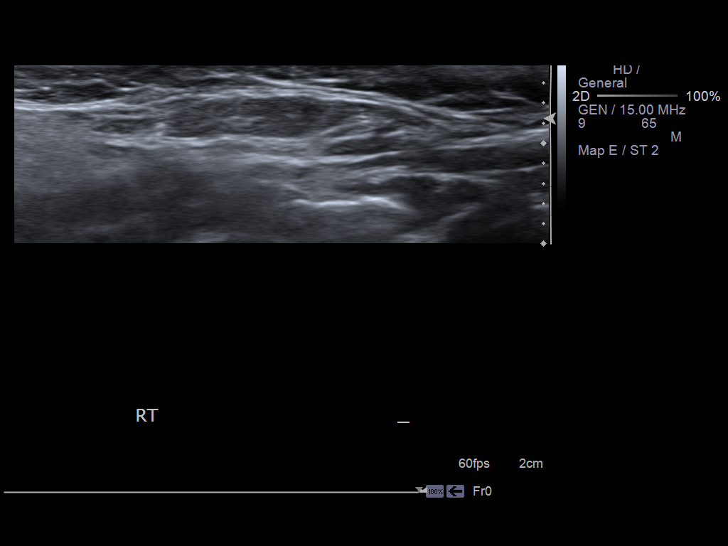
[im 5/15]
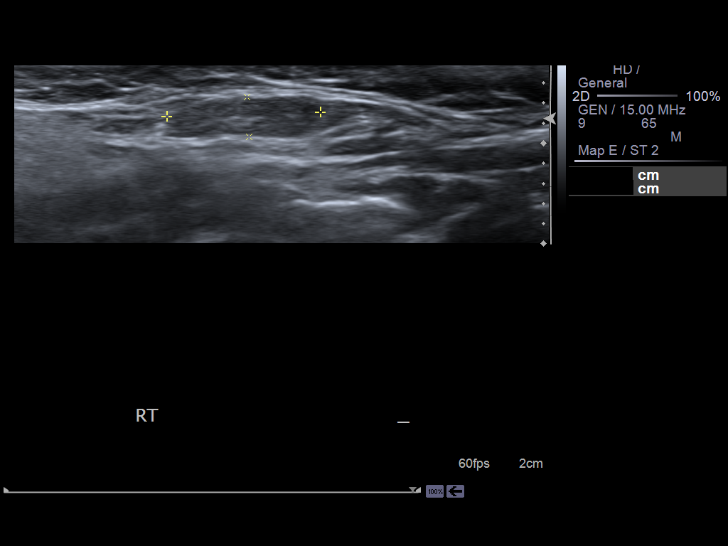
[im 6/15]
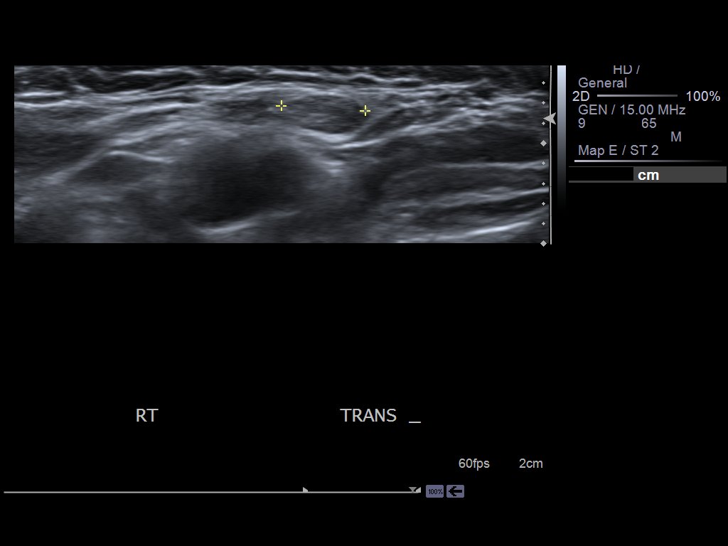
[im 7/15]
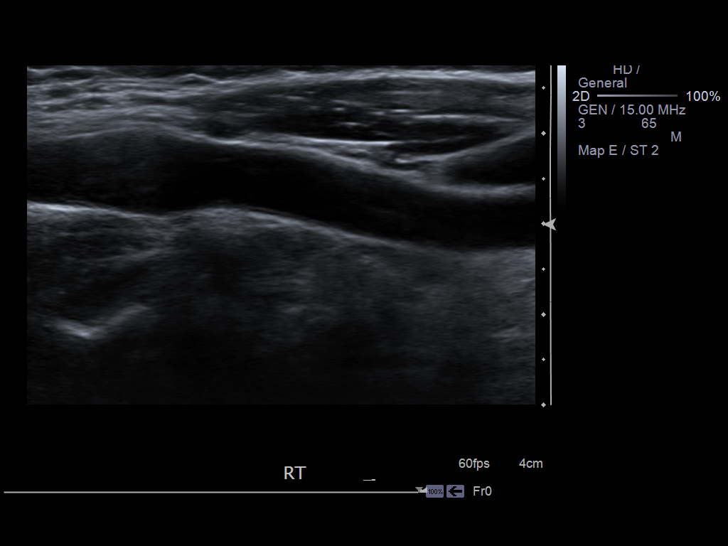
[im 9/15]
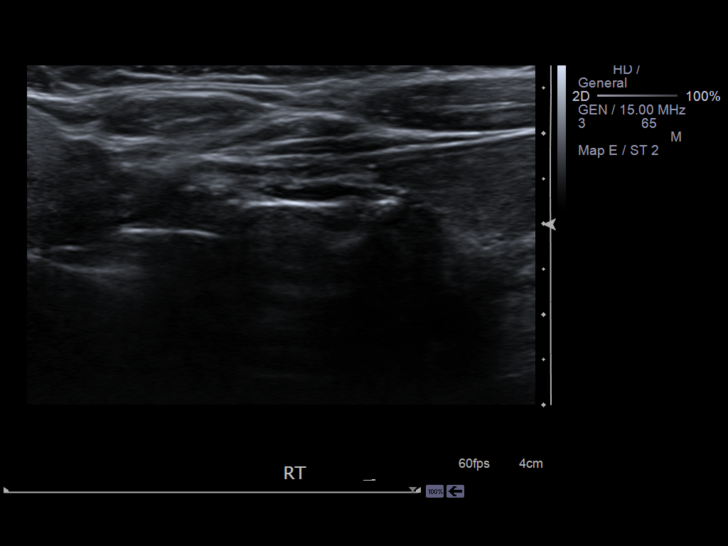
[im 10/15]
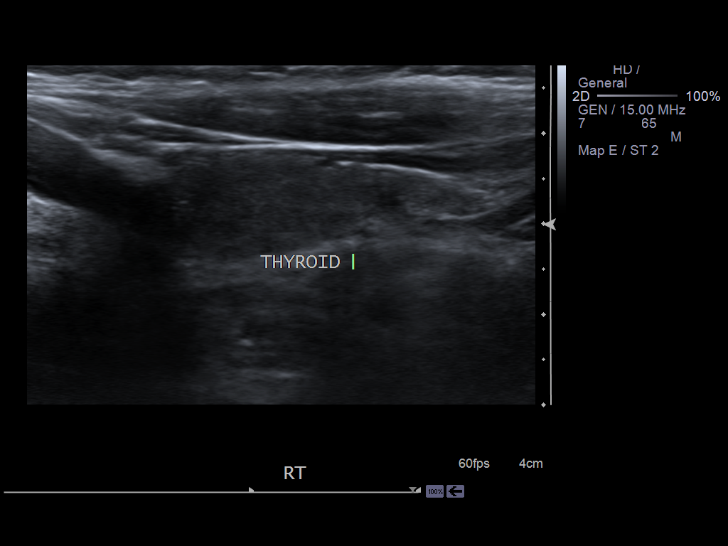
[im 11/15]
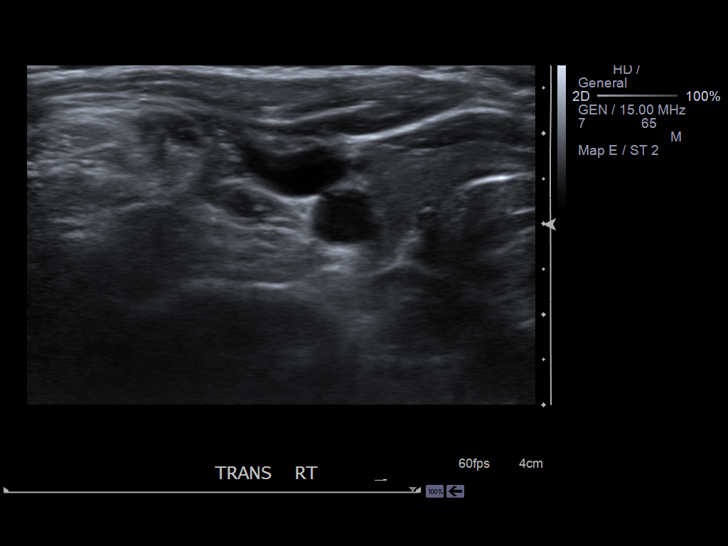
[im 12/15]
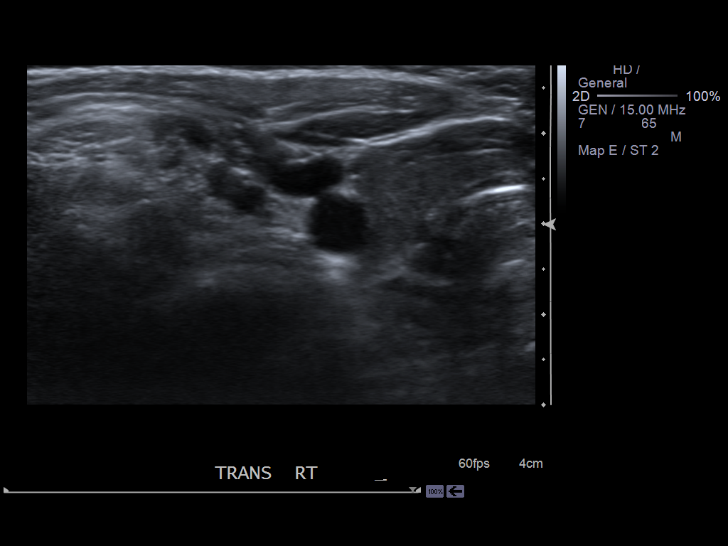
[im 13/15]
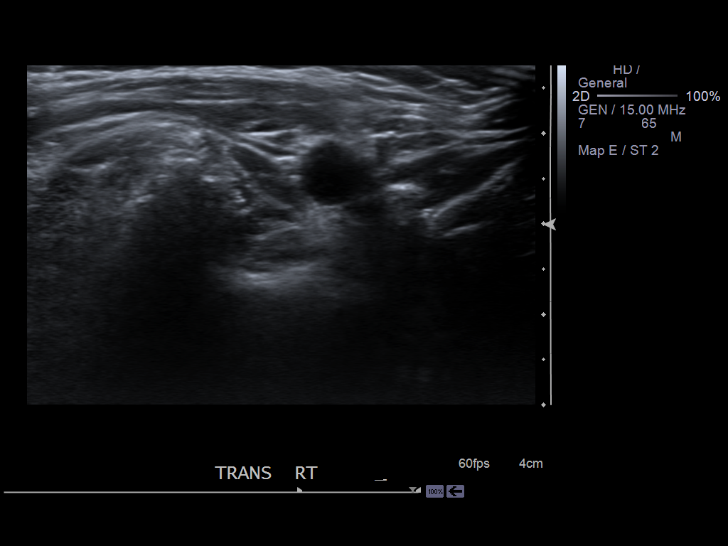
[im 14/15]
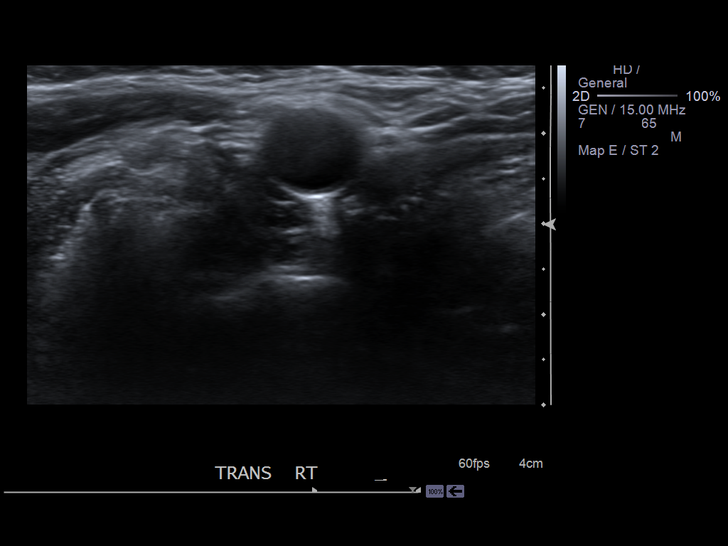
[im 15/15]
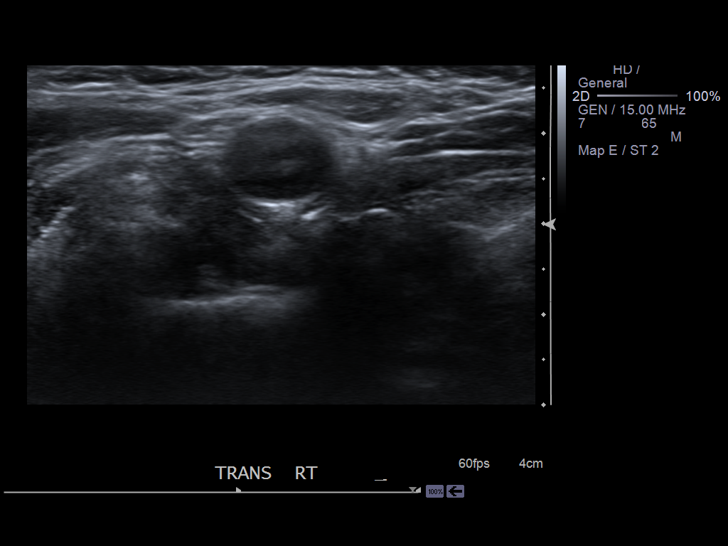

[14 of 15 positions shown; findings below may reference images not displayed]

PROCEDURE:     LAVONNE - LAVONNE SOFT TISSUE HEAD/NECK/THYROI  - [DATE]  [DATE]

RESULT:

The right neck was evaluated in the region of palpable concern.

A 1.53 x 0.4 x 0.84 cm, hypoechoic nodule is identified. There is suggestion
of central echogenicity. There is the suggestion of asymmetric focal
vascularity. These findings suggest the sequela of a small lymph node. No
further solid or cystic sonographic abnormalities identified.
IMPRESSION: Findings suggesting a small lymph node in the region of
palpable concern without further sonographic abnormalities.

Addendum Ends

## 2013-07-13 ENCOUNTER — Telehealth: Payer: Self-pay | Admitting: *Deleted

## 2013-07-13 NOTE — Telephone Encounter (Signed)
Can continue zyrtec daily, but would change benadryl to once daily.

## 2013-07-13 NOTE — Telephone Encounter (Signed)
If persistent , needs evaluation.  I can see her 07/16/13 (Friday) at 11:00 - work in for this.  If more acute issues, to acute care and then I can f/u with her after.

## 2013-07-13 NOTE — Telephone Encounter (Signed)
Pt notified and verbalized understanding.

## 2013-07-13 NOTE — Telephone Encounter (Signed)
Pt notified, took appointment for 07/16/13 at 11:00. Advised to be seen in urgent Care if symptoms worsen prior to that appointment, verbalized understanding. She says she has been taking Benadryl 25 mg 2 tabs once or twice daily and a Zyrtec to help with symptoms. Wants to know if it is ok to continue this until her appointment as needed

## 2013-07-13 NOTE — Telephone Encounter (Signed)
Pt presented to office to show a rash she has been having off and on since 07/02/13. Currently has a red, raised area on her left forearm, about 4 inches x 2 inches in size, states has decreased in size since last evening, itchy and "burning". Has been using Benadryl, Ibuprofen and Zyrtec with improvement in symptoms. States has had these patches also on her back and upper and lower lips. They have been coming and going since 07/02/13. Denies beginning any new medications, any food allergies. States has not started using any new lotions/laundry detergent. Denies having anything like this in the past. Denies any fever and shortness of breath.  Advised I would let Dr. Lorin Picket know and call her with what she recommends.

## 2013-07-16 ENCOUNTER — Ambulatory Visit (INDEPENDENT_AMBULATORY_CARE_PROVIDER_SITE_OTHER): Payer: BC Managed Care – PPO | Admitting: Internal Medicine

## 2013-07-16 ENCOUNTER — Encounter: Payer: Self-pay | Admitting: Emergency Medicine

## 2013-07-16 ENCOUNTER — Encounter: Payer: Self-pay | Admitting: Internal Medicine

## 2013-07-16 VITALS — BP 122/80 | HR 80 | Temp 98.2°F | Ht 67.0 in | Wt 188.8 lb

## 2013-07-16 DIAGNOSIS — R220 Localized swelling, mass and lump, head: Secondary | ICD-10-CM

## 2013-07-16 DIAGNOSIS — K130 Diseases of lips: Secondary | ICD-10-CM

## 2013-07-16 DIAGNOSIS — R21 Rash and other nonspecific skin eruption: Secondary | ICD-10-CM

## 2013-07-16 MED ORDER — ALBUTEROL SULFATE HFA 108 (90 BASE) MCG/ACT IN AERS: 2.0000 | INHALATION_SPRAY | Freq: Four times a day (QID) | RESPIRATORY_TRACT | Status: DC | PRN

## 2013-07-16 NOTE — Progress Notes (Signed)
Pre-visit discussion using our clinic review tool. No additional management support is needed unless otherwise documented below in the visit note.  

## 2013-07-18 ENCOUNTER — Encounter: Payer: Self-pay | Admitting: Internal Medicine

## 2013-07-18 NOTE — Assessment & Plan Note (Signed)
Persistent intermittent rash.  Intermittent lip swelling.  No known triggers.  Given persistent intermittent problem (especially with the lip swelling) - will refer to an allergist for further evaluation and testing.

## 2013-07-18 NOTE — Progress Notes (Signed)
  Subjective:    Patient ID: Donnella Bi, female    DOB: 1951-04-15, 62 y.o.   MRN: [id number]  Rash  62 year old female with past history of MVP, rheumatic fever, and IBS who comes in today as a work in with concerns regarding an intermittent rash and lip swelling.  She reports that she has had intermittent flares.  Rash present on various pairs of her bldy.  Has involved posterior legs, groin, arms.  Also describes areas on her lower back.  Are painful at times.  She also has had three different episodes of lip swelling.  The rash itches also.  Has completely resolved now.  No rash.  No itching.  No lip swelling now.  Unclear of any precipitating factors.      Past Medical History  Diagnosis Date  . Arthritis   . Chicken pox   . Rheumatic fever   . MRSA exposure 2005    Spider bite  . Cholecystitis 11/2011    Did not require sgy - Dr. Sammuel Cooper - [facility]  (cholelithiasis)  . MVP (mitral valve prolapse)     Stable - Dr. Lady Gary  . Hypertension   . IBS (irritable bowel syndrome)   . H/O Clostridium difficile infection     Review of Systems  Skin: Positive for rash.  Patient denies any headache, lightheadedness or dizziness.  No sinus or allergy symptoms.  No chest pain, tightness or palpitations.  No increased shortness of breath, cough or congestion.  No acid reflux.  No dysphagia.  No nausea or vomiting.  No abdominal pain or cramping.  No bowel change.  No fever.  Rash as outlined.  Intermittent lip swelling.  Resolved now.  No known triggers.  No tongue swelling or sob.         Objective:   Physical Exam  Filed Vitals:   07/16/13 1105  BP: 122/80  Pulse: 80  Temp: 98.2 F (19.25 C)   62 year old female in no acute distress.   HEENT:  Nares- clear.  Oropharynx - without lesions.  No lip swelling.   NECK:  Supple.  Nontender.   HEART:  Appears to be regular. LUNGS:  No crackles or wheezing audible.  Respirations even and unlabored.  RADIAL PULSE:  Equal bilaterally.     SKIN:   No rash.      Assessment & Plan:

## 2013-09-24 ENCOUNTER — Telehealth: Payer: Self-pay | Admitting: Internal Medicine

## 2013-09-24 NOTE — Telephone Encounter (Signed)
Noted  

## 2013-09-24 NOTE — Telephone Encounter (Signed)
Patient Information:  Caller Name: Markel  Phone: [phone]  Patient: Elle, Vezina  Gender: Female  DOB: 09/30/1950  Age: 63 Years  PCP: Einar Pheasant  Office Follow Up:  Does the office need to follow up with this patient?: No  Instructions For The Office: N/A  RN Note:  Patient will call back for appt time in Saturday clinic.  Symptoms  Reason For Call & Symptoms: Patient states onset of illness 2 weeks ago 09/10/13.  She describes nausea and fever for x4 days. No respiratory symptoms.  Onset of cough 09/13/13. occasional productive white,  she coughs till emesis. she has been sleeping on pillows.  Reviewed Health History In EMR: Yes  Reviewed Medications In EMR: Yes  Reviewed Allergies In EMR: Yes  Reviewed Surgeries / Procedures: Yes  Date of Onset of Symptoms: 09/10/2013  Treatments Tried: Mucinex, saliene , Aleve  Treatments Tried Worked: Yes  Guideline(s) Used:  Cough  Disposition Per Guideline:   See Today or Tomorrow in Office  Reason For Disposition Reached:   Patient wants to be seen  Advice Given:  Reassurance  Coughing is the way that our lungs remove irritants and mucus. It helps protect our lungs from getting pneumonia.  You can get a dry hacking cough after a chest cold. Sometimes this type of cough can last 1-3 weeks, and be worse at night.  Cough Medicines:  OTC Cough Syrups: The most common cough suppressant in OTC cough medications is dextromethorphan. Often the letters "DM" appear in the name.  Home Remedy - Honey: This old home remedy has been shown to help decrease coughing at night. The adult dosage is 2 teaspoons (10 ml) at bedtime. Honey should not be given to infants under one year of age.  OTC Cough Syrup - Dextromethorphan:  Cough syrups containing the cough suppressant dextromethorphan (DM) may help decrease your cough. Cough syrups work best for coughs that keep you awake at night. They can also sometimes help in the late stages of a  respiratory infection when the cough is dry and hacking. They can be used along with cough drops.  Examples: Benylin, Robitussin DM, Vicks 44 Cough Relief  Coughing Spasms:  Drink warm fluids. Inhale warm mist (Reason: both relax the airway and loosen up the phlegm).  Suck on cough drops or hard candy to coat the irritated throat.  Prevent Dehydration:  Drink adequate liquids.  This will help soothe an irritated or dry throat and loosen up the phlegm.  Patient Will Follow Care Advice:  YES

## 2013-09-25 ENCOUNTER — Ambulatory Visit (INDEPENDENT_AMBULATORY_CARE_PROVIDER_SITE_OTHER): Payer: BC Managed Care – PPO | Admitting: Internal Medicine

## 2013-09-25 ENCOUNTER — Encounter: Payer: Self-pay | Admitting: Internal Medicine

## 2013-09-25 VITALS — BP 130/80 | HR 85 | Temp 98.1°F | Ht 68.0 in | Wt 188.5 lb

## 2013-09-25 DIAGNOSIS — J209 Acute bronchitis, unspecified: Secondary | ICD-10-CM

## 2013-09-25 MED ORDER — ALBUTEROL SULFATE HFA 108 (90 BASE) MCG/ACT IN AERS: 2.0000 | INHALATION_SPRAY | Freq: Four times a day (QID) | RESPIRATORY_TRACT | Status: DC | PRN

## 2013-09-25 MED ORDER — AZITHROMYCIN 250 MG PO TABS: ORAL_TABLET | ORAL | Status: DC

## 2013-09-25 NOTE — Progress Notes (Signed)
Subjective:    Patient ID: Lynn Mann, female    DOB: 23-Sep-1950, 63 y.o.   MRN: [id number]  HPI  Pt reports the clinic today with cough and nausea. This started about 2 weeks ago. She also c/o headache, fatigue, fever and body aches. The cough started about 2 days ago. She tends to have coughing spells, more so when she eats. The coughing spells also make her throw up. The cough is productive of thick white mucous. She has tried Mucinex. She has no history of seasonal allergies or breathing problems. She has had sick contacts.  Review of Systems      Past Medical History  Diagnosis Date  . Arthritis   . Chicken pox   . Rheumatic fever   . MRSA exposure 2005    Spider bite  . Cholecystitis 11/2011    Did not require sgy - Dr. Staci Acosta - [facility]  (cholelithiasis)  . MVP (mitral valve prolapse)     Stable - Dr. Ubaldo Glassing  . Hypertension   . IBS (irritable bowel syndrome)   . H/O Clostridium difficile infection     Current Outpatient Prescriptions  Medication Sig Dispense Refill  . albuterol (PROVENTIL HFA;VENTOLIN HFA) 108 (90 BASE) MCG/ACT inhaler Inhale 2 puffs into the lungs every 6 (six) hours as needed for wheezing.  1 Inhaler  2   No current facility-administered medications for this visit.    Allergies  Allergen Reactions  . Adhesive [Tape]   . Yellow Dyes (Non-Tartrazine) Other (See Comments)    Arthritis pains    Family History  Problem Relation Age of Onset  . Arthritis Mother   . Stroke Mother   . Diabetes Mother   . Hypertension Mother   . Arthritis Father   . Stroke Father   . Diabetes Paternal Grandmother   . Cancer Paternal Uncle     colon  . Breast cancer Maternal Aunt   . Heart disease      maternal and paternal side    History   Social History  . Marital Status: Married    Spouse Name: N/A    Number of Children: N/A  . Years of Education: 16   Occupational History  . Retired    Social History Main Topics  . Smoking status: Never  Smoker   . Smokeless tobacco: Never Used  . Alcohol Use: Yes     Comment: Rarely  . Drug Use: No  . Sexual Activity: Yes    Birth Control/ Protection: Post-menopausal   Other Topics Concern  . Not on file   Social History Narrative  . No narrative on file     Constitutional: Pt reports fatigue. Denies fever, malaise, headache or abrupt weight changes.  HEENT: Pt reports sore throat. Denies eye pain, eye redness, ear pain, ringing in the ears, wax buildup, runny nose, nasal congestion, bloody nose. Respiratory: Pt reports cough. Denies difficulty breathing, shortness of breath, or sputum production.   Cardiovascular: Denies chest pain, chest tightness, palpitations or swelling in the hands or feet.    No other specific complaints in a complete review of systems (except as listed in HPI above).  Objective:   Physical Exam   BP 130/80  Pulse 85  Temp(Src) 98.1 F (36.7 C) (Oral)  Ht 5\' 8"  (1.727 m)  Wt 188 lb 8 oz (85.503 kg)  BMI 28.67 kg/m2  SpO2 97% Wt Readings from Last 3 Encounters:  09/25/13 188 lb 8 oz (85.503 kg)  07/16/13 188 lb  12 oz (85.616 kg)  04/27/13 184 lb (83.462 kg)    General: Appears her stated age, well developed, well nourished in NAD. HEENT: Head: normal shape and size; Eyes: sclera white, no icterus, conjunctiva pink, PERRLA and EOMs intact; Ears: Tm's gray and intact, normal light reflex; Nose: mucosa pink and moist, septum midline; Throat/Mouth: Teeth present, mucosa pink and moist, no exudate, lesions or ulcerations noted.  Neck: Normal range of motion. Neck supple, trachea midline. No massses, lumps or thyromegaly present.  Cardiovascular: Normal rate and rhythm. S1,S2 noted.  No murmur, rubs or gallops noted. No JVD or BLE edema. No carotid bruits noted. Pulmonary/Chest: Normal effort and scattered rhonchi throughout. No respiratory distress. No wheezes, rales or noted.    BMET    Component Value Date/Time   NA 140 12/01/2012 0842   K 4.2  12/01/2012 0842   CL 104 12/01/2012 0842   CO2 28 12/01/2012 0842   GLUCOSE 111* 12/01/2012 0842   BUN 11 12/01/2012 0842   CREATININE 0.7 12/01/2012 0842   CALCIUM 9.3 12/01/2012 0842    Lipid Panel     Component Value Date/Time   CHOL 208* 01/18/2013 1201   TRIG 245.0* 01/18/2013 1201   HDL 38.90* 01/18/2013 1201   CHOLHDL 5 01/18/2013 1201   VLDL 49.0* 01/18/2013 1201    CBC    Component Value Date/Time   WBC 7.5 12/01/2012 0842   RBC 4.77 12/01/2012 0842   HGB 14.1 12/01/2012 0842   HCT 41.3 12/01/2012 0842   PLT 285.0 12/01/2012 0842   MCV 86.7 12/01/2012 0842   MCHC 34.0 12/01/2012 0842   RDW 13.3 12/01/2012 0842   LYMPHSABS 1.9 12/01/2012 0842   MONOABS 0.4 12/01/2012 0842   EOSABS 0.1 12/01/2012 0842   BASOSABS 0.0 12/01/2012 0842    Hgb A1C No results found for this basename: HGBA1C        Assessment & Plan:   Acute Bronchitis:  Salt water gargles for the sore throat Tylenol for pain/fever eRx for azithromax to help clear up the cough and chest congestion Drink plenty of fluids Encouraged her to take an OTC anthistamine She declines RX for nausea  RTC as needed or if symptoms persist

## 2013-09-25 NOTE — Progress Notes (Signed)
Pre visit review using our clinic review tool, if applicable. No additional management support is needed unless otherwise documented below in the visit note. 

## 2013-09-25 NOTE — Patient Instructions (Addendum)

## 2013-10-01 NOTE — Telephone Encounter (Signed)
Patient Information:  Caller Name: Devlyn  Phone: [phone]  Patient: Lynn Mann, Lynn Mann  Gender: Female  DOB: 1951-02-16  Age: 63 Years  PCP: Einar Pheasant  Office Follow Up:  Does the office need to follow up with this patient?: No  Instructions For The Office: N/A   Symptoms  Reason For Call & Symptoms: Pt has had a cough/congestion x 1 week. She was seen at the office in [place] last week on Sat 09/25/13. She was treated with a z-pac. Pt states she has a sore neck now and has a hx of cervical spurs. Her glands are swollen and she has developed a sore throat.  Reviewed Health History In EMR: Yes  Reviewed Medications In EMR: Yes  Reviewed Allergies In EMR: Yes  Reviewed Surgeries / Procedures: Yes  Date of Onset of Symptoms: 09/30/2013  Treatments Tried: finished zpac on 09/29/13.  Treatments Tried Worked: No  Guideline(s) Used:  Sore Throat  Disposition Per Guideline:   See Today in Office/appts are full. Pt directed to UC.  Reason For Disposition Reached:   Severe sore throat pain  Advice Given:  N/A  Patient Will Follow Care Advice:  YES

## 2013-10-01 NOTE — Telephone Encounter (Signed)
Noted  

## 2013-10-01 NOTE — Telephone Encounter (Signed)
FYI

## 2013-11-08 ENCOUNTER — Ambulatory Visit: Payer: Self-pay | Admitting: Otolaryngology

## 2013-11-08 IMAGING — US THYROID ULTRASOUND
1 series · 14 of 15 positions shown · non-contrast
Comparison: [DATE]

CLINICAL DATA: Right neck swelling question lymph node

EXAM:
ULTRASOUND OF HEAD/NECK SOFT TISSUES
TECHNIQUE: Ultrasound examination of the head and neck soft tissues was
performed in the area of clinical concern.

[Series 1: thyroid ultrasound · 0.08mm/px · 14 of 15 slices shown]
[im 1/15]
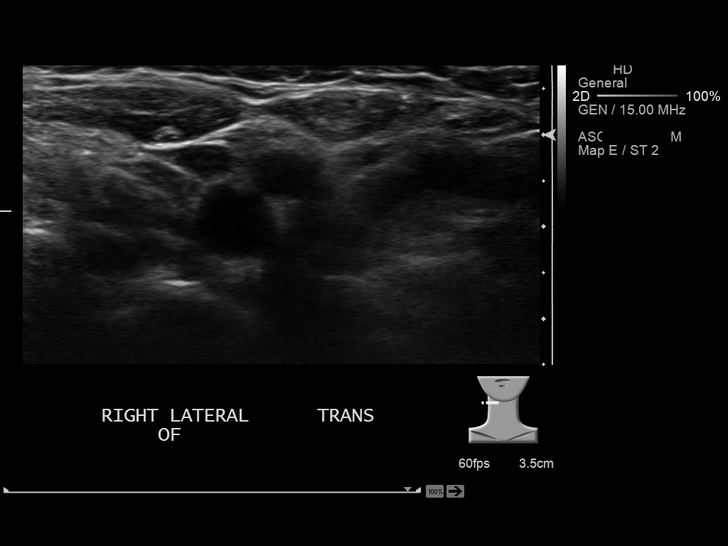
[im 2/15]
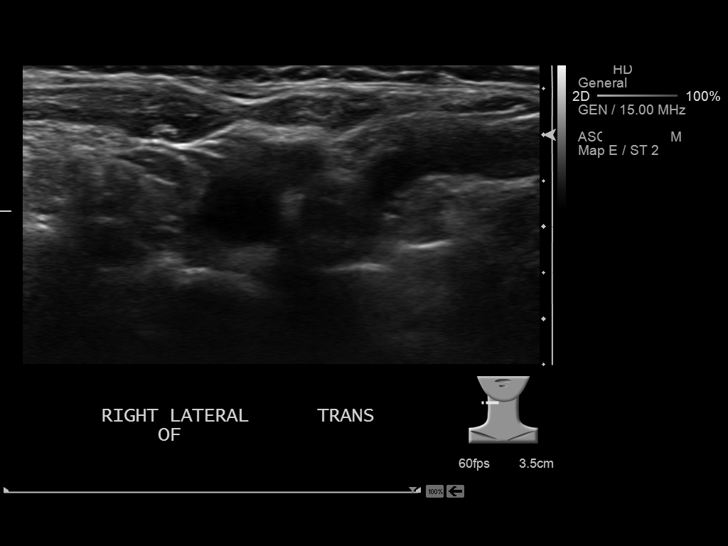
[im 3/15]
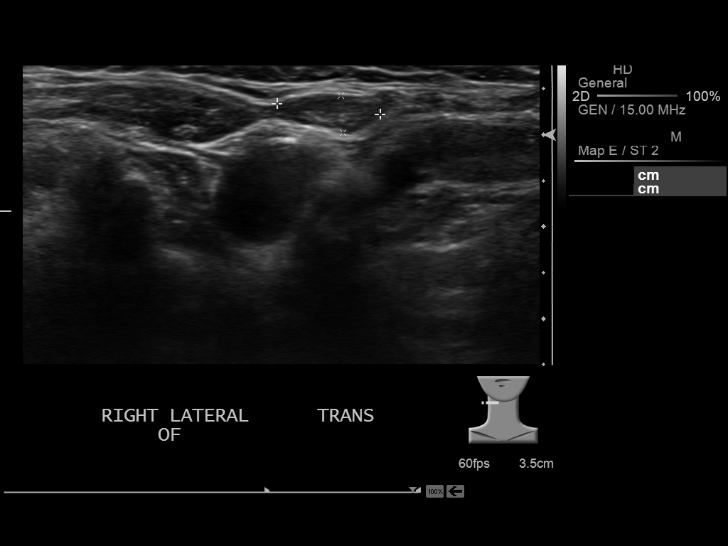
[im 4/15]
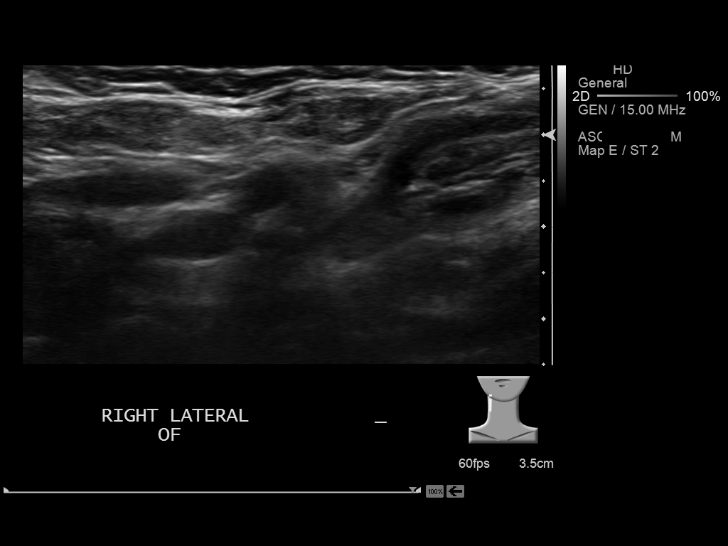
[im 5/15]
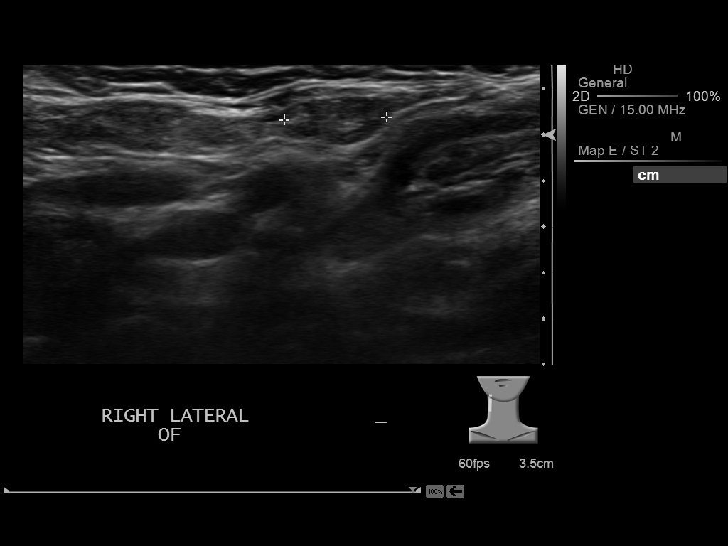
[im 6/15]
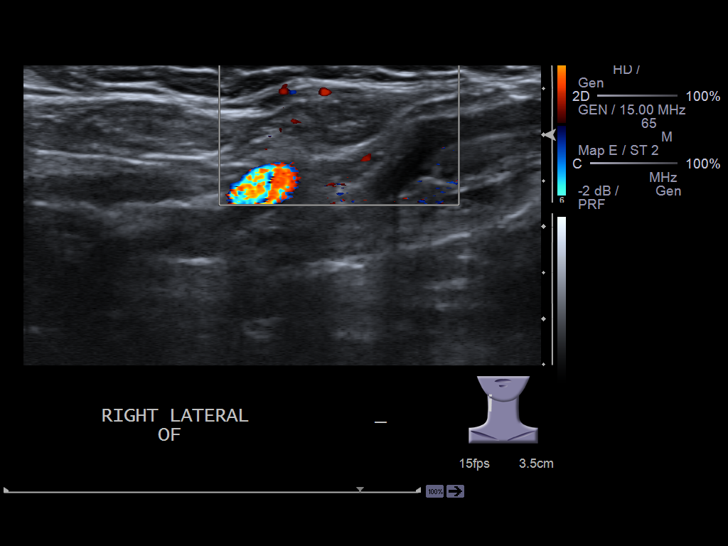
[im 7/15]
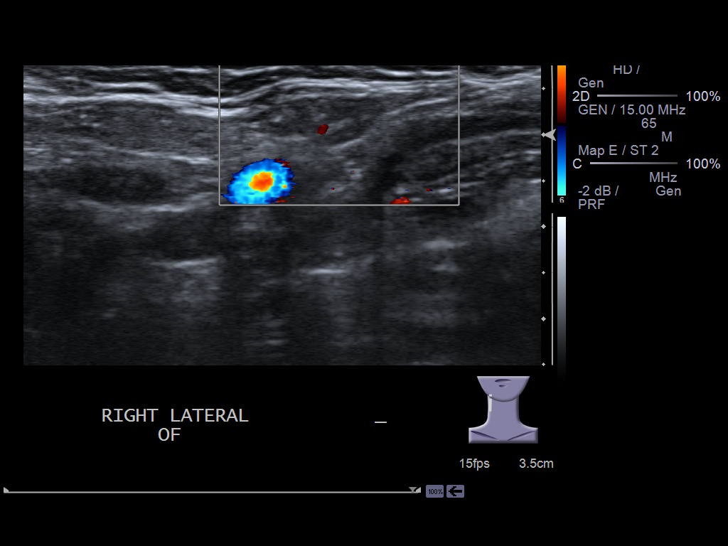
[im 9/15]
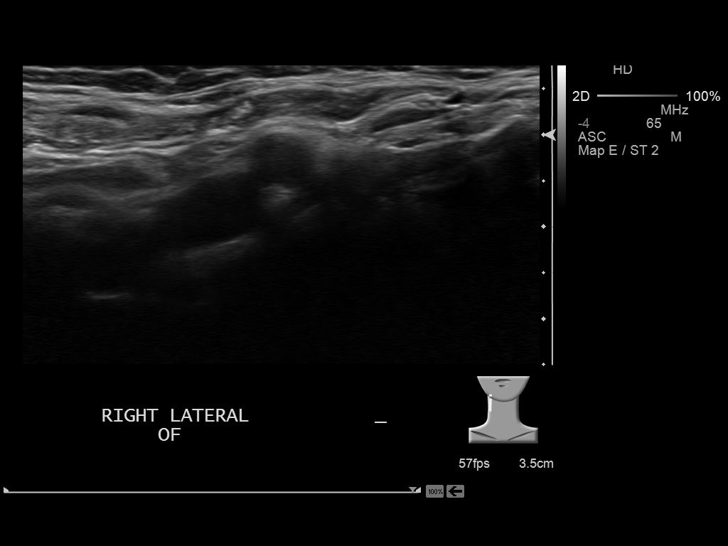
[im 10/15]
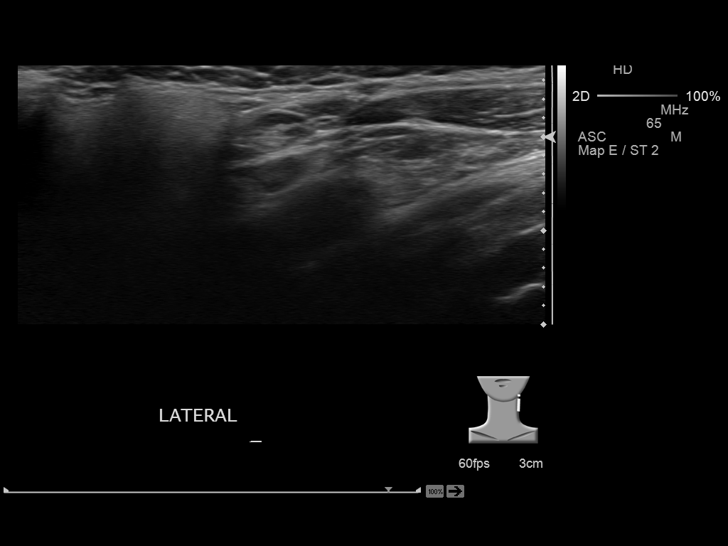
[im 11/15]
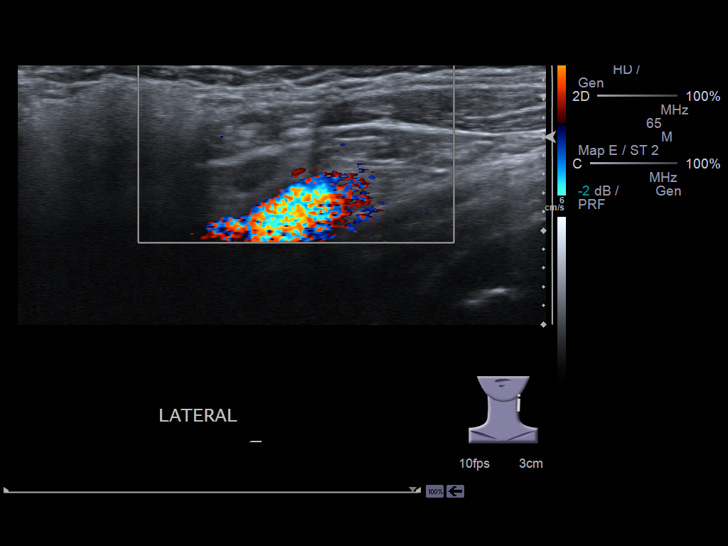
[im 12/15]
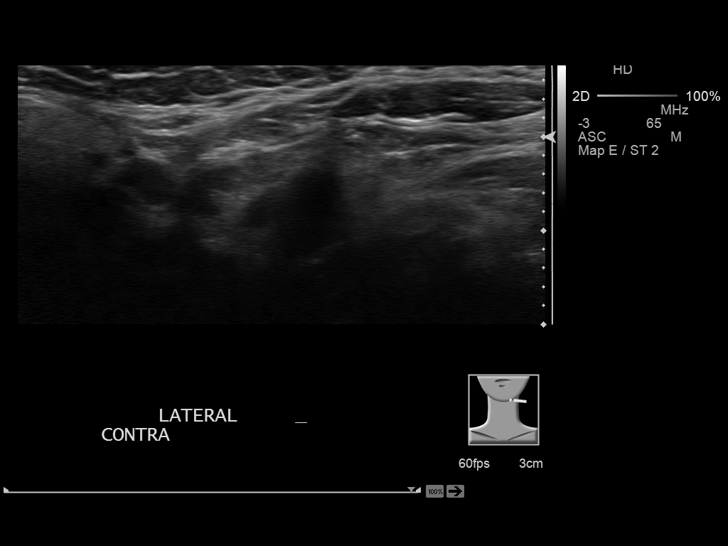
[im 13/15]
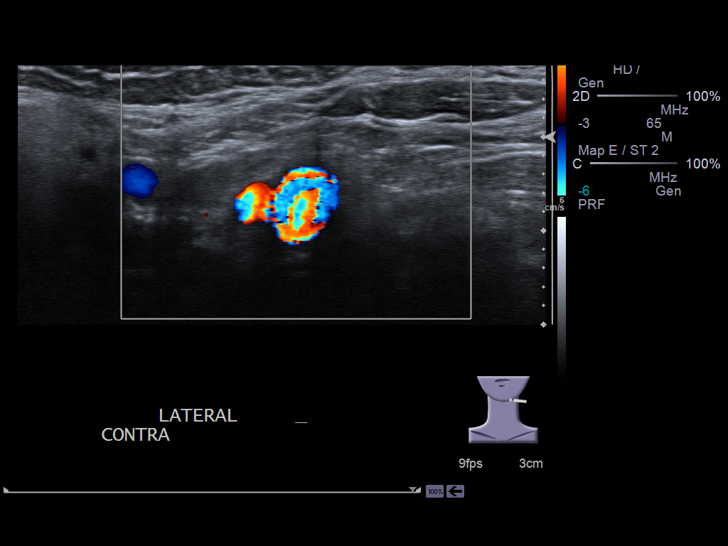
[im 14/15]
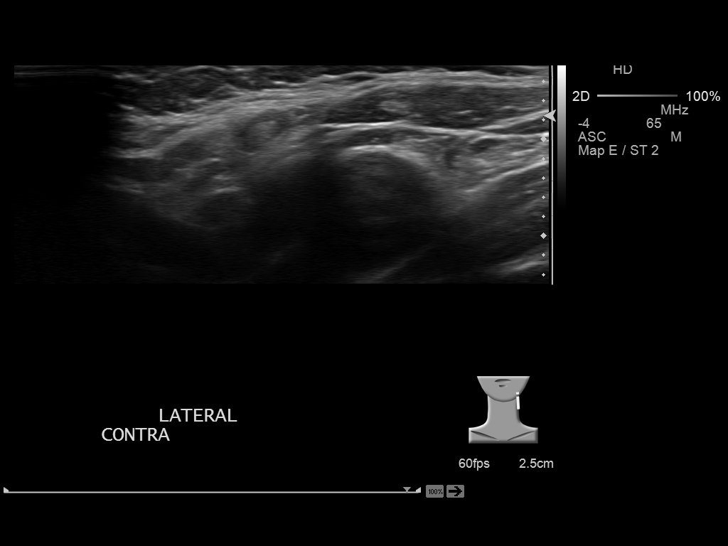
[im 15/15]
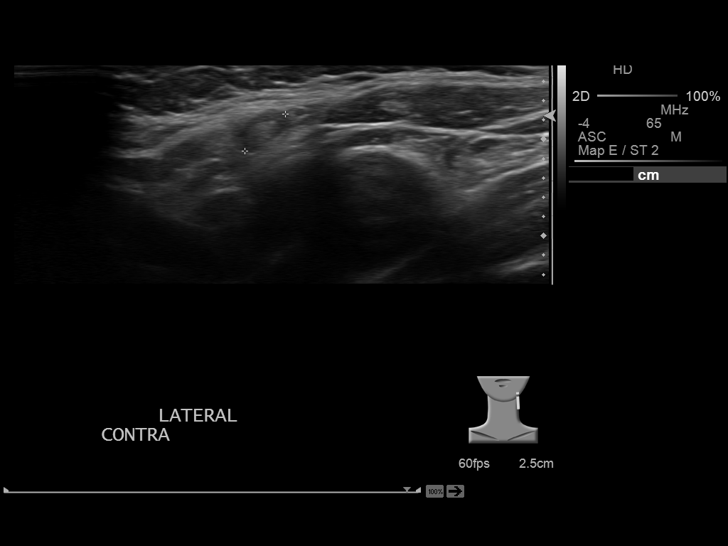

[14 of 15 positions shown; findings below may reference images not displayed]

FINDINGS: At the site of clinical concern, a small hypoechoic nodule with a
slightly hyperechoic core is identified, measuring 11 x 4 x 11 mm.
Morphology is most consistent with a a cervical lymph node, similar
to previous exam. No additional cervical mass or abnormal fluid
collection is identified.
IMPRESSION: Small soft tissue nodule in the right cervical region at site of
clinical concern, 11 x 4 x 11 mm, likely representing a normal sized
right cervical lymph node, not significantly changed from previous
exam.

## 2013-11-25 ENCOUNTER — Ambulatory Visit (INDEPENDENT_AMBULATORY_CARE_PROVIDER_SITE_OTHER): Payer: BC Managed Care – PPO | Admitting: Internal Medicine

## 2013-11-25 ENCOUNTER — Encounter: Payer: Self-pay | Admitting: Internal Medicine

## 2013-11-25 ENCOUNTER — Ambulatory Visit (INDEPENDENT_AMBULATORY_CARE_PROVIDER_SITE_OTHER)
Admission: RE | Admit: 2013-11-25 | Discharge: 2013-11-25 | Disposition: A | Discharge status: Home / Self Care | Payer: BC Managed Care – PPO | Source: Ambulatory Visit | Attending: Internal Medicine | Admitting: Internal Medicine

## 2013-11-25 ENCOUNTER — Telehealth: Payer: Self-pay | Admitting: Internal Medicine

## 2013-11-25 VITALS — BP 130/80 | HR 75 | Temp 98.2°F | Ht 68.0 in | Wt 185.5 lb

## 2013-11-25 DIAGNOSIS — K589 Irritable bowel syndrome without diarrhea: Secondary | ICD-10-CM

## 2013-11-25 DIAGNOSIS — M542 Cervicalgia: Secondary | ICD-10-CM

## 2013-11-25 DIAGNOSIS — K219 Gastro-esophageal reflux disease without esophagitis: Secondary | ICD-10-CM

## 2013-11-25 DIAGNOSIS — Z1239 Encounter for other screening for malignant neoplasm of breast: Secondary | ICD-10-CM

## 2013-11-25 DIAGNOSIS — J988 Other specified respiratory disorders: Secondary | ICD-10-CM

## 2013-11-25 DIAGNOSIS — Z8619 Personal history of other infectious and parasitic diseases: Secondary | ICD-10-CM

## 2013-11-25 DIAGNOSIS — J189 Pneumonia, unspecified organism: Secondary | ICD-10-CM

## 2013-11-25 DIAGNOSIS — R5381 Other malaise: Secondary | ICD-10-CM

## 2013-11-25 DIAGNOSIS — E7800 Pure hypercholesterolemia, unspecified: Secondary | ICD-10-CM

## 2013-11-25 DIAGNOSIS — M25569 Pain in unspecified knee: Secondary | ICD-10-CM

## 2013-11-25 DIAGNOSIS — R5383 Other fatigue: Secondary | ICD-10-CM

## 2013-11-25 DIAGNOSIS — Z8679 Personal history of other diseases of the circulatory system: Secondary | ICD-10-CM

## 2013-11-25 DIAGNOSIS — Z86010 Personal history of colonic polyps: Secondary | ICD-10-CM

## 2013-11-25 DIAGNOSIS — I341 Nonrheumatic mitral (valve) prolapse: Secondary | ICD-10-CM

## 2013-11-25 DIAGNOSIS — R9389 Abnormal findings on diagnostic imaging of other specified body structures: Secondary | ICD-10-CM

## 2013-11-25 DIAGNOSIS — I059 Rheumatic mitral valve disease, unspecified: Secondary | ICD-10-CM

## 2013-11-25 IMAGING — CR DG CERVICAL SPINE 2 OR 3 VIEWS
3 series · 3 of 3 positions shown · non-contrast
Comparison: None.

CLINICAL DATA: Neck pain

EXAM:
CERVICAL SPINE - 2-3 VIEW

[view not recorded (1 of 3)]
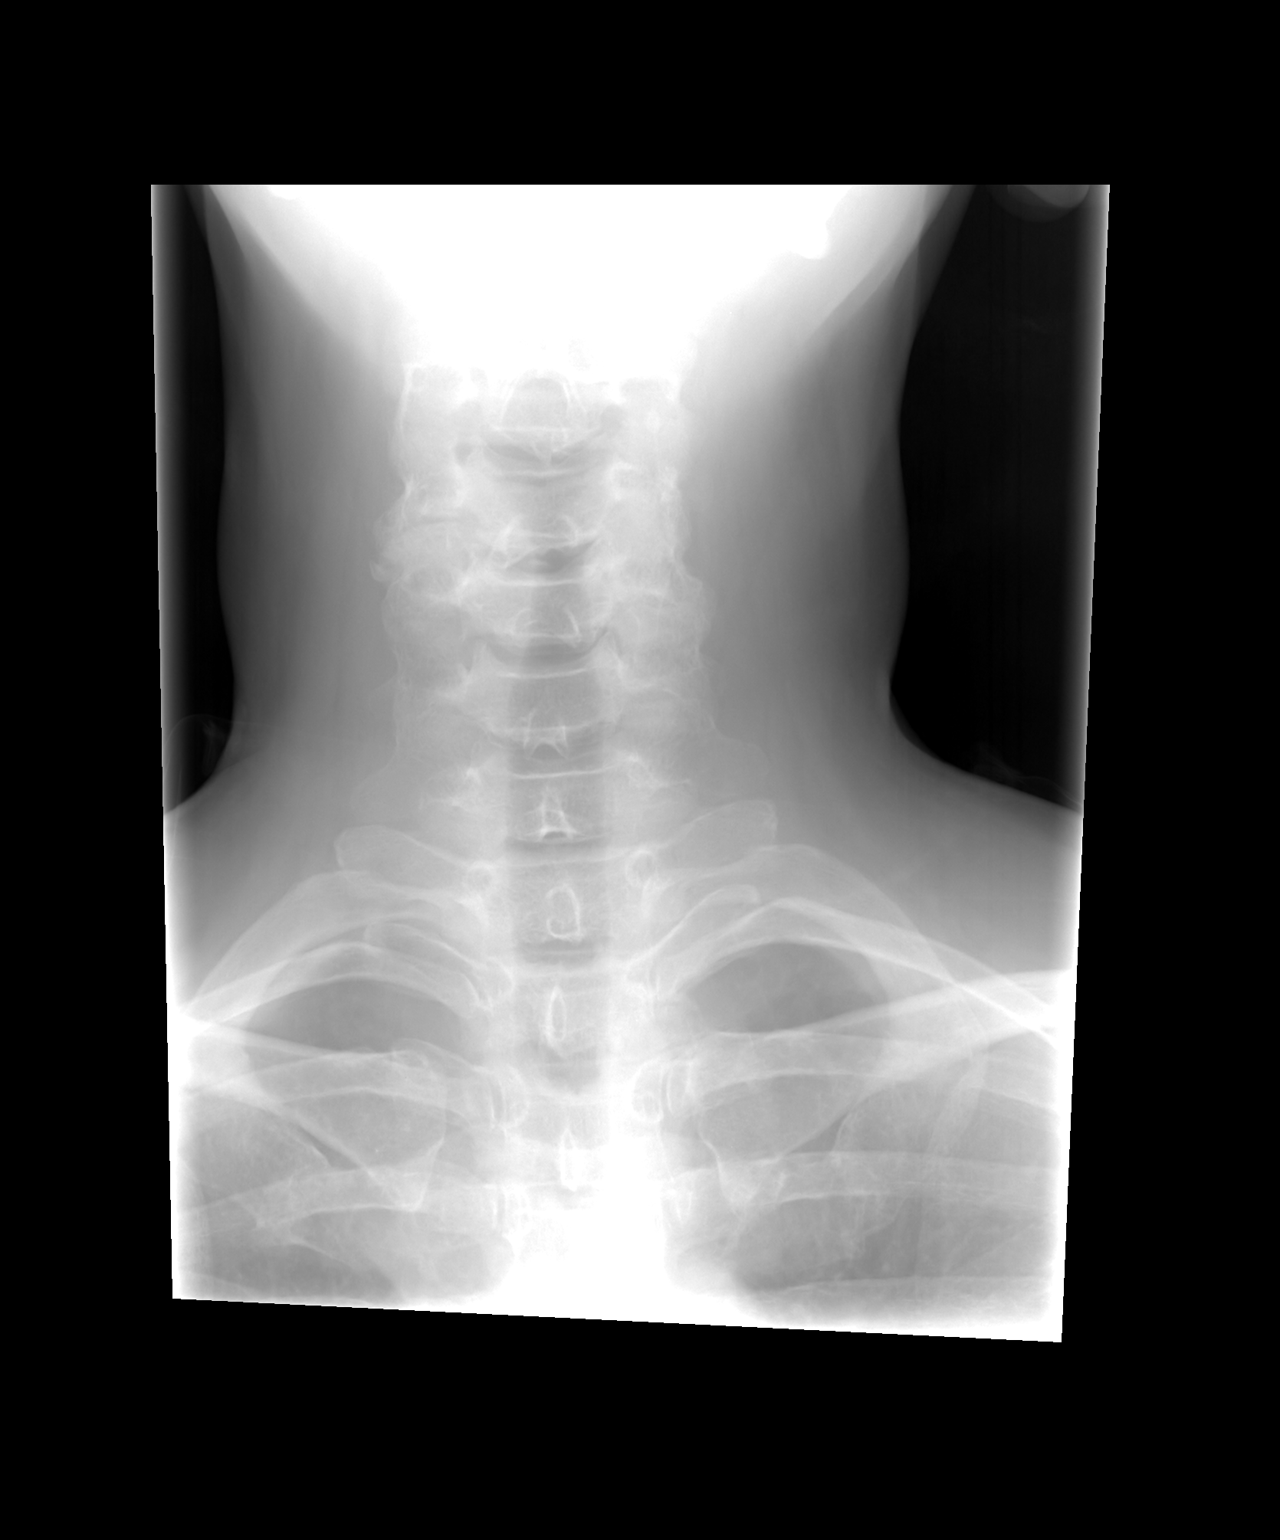

[view not recorded (2 of 3)]
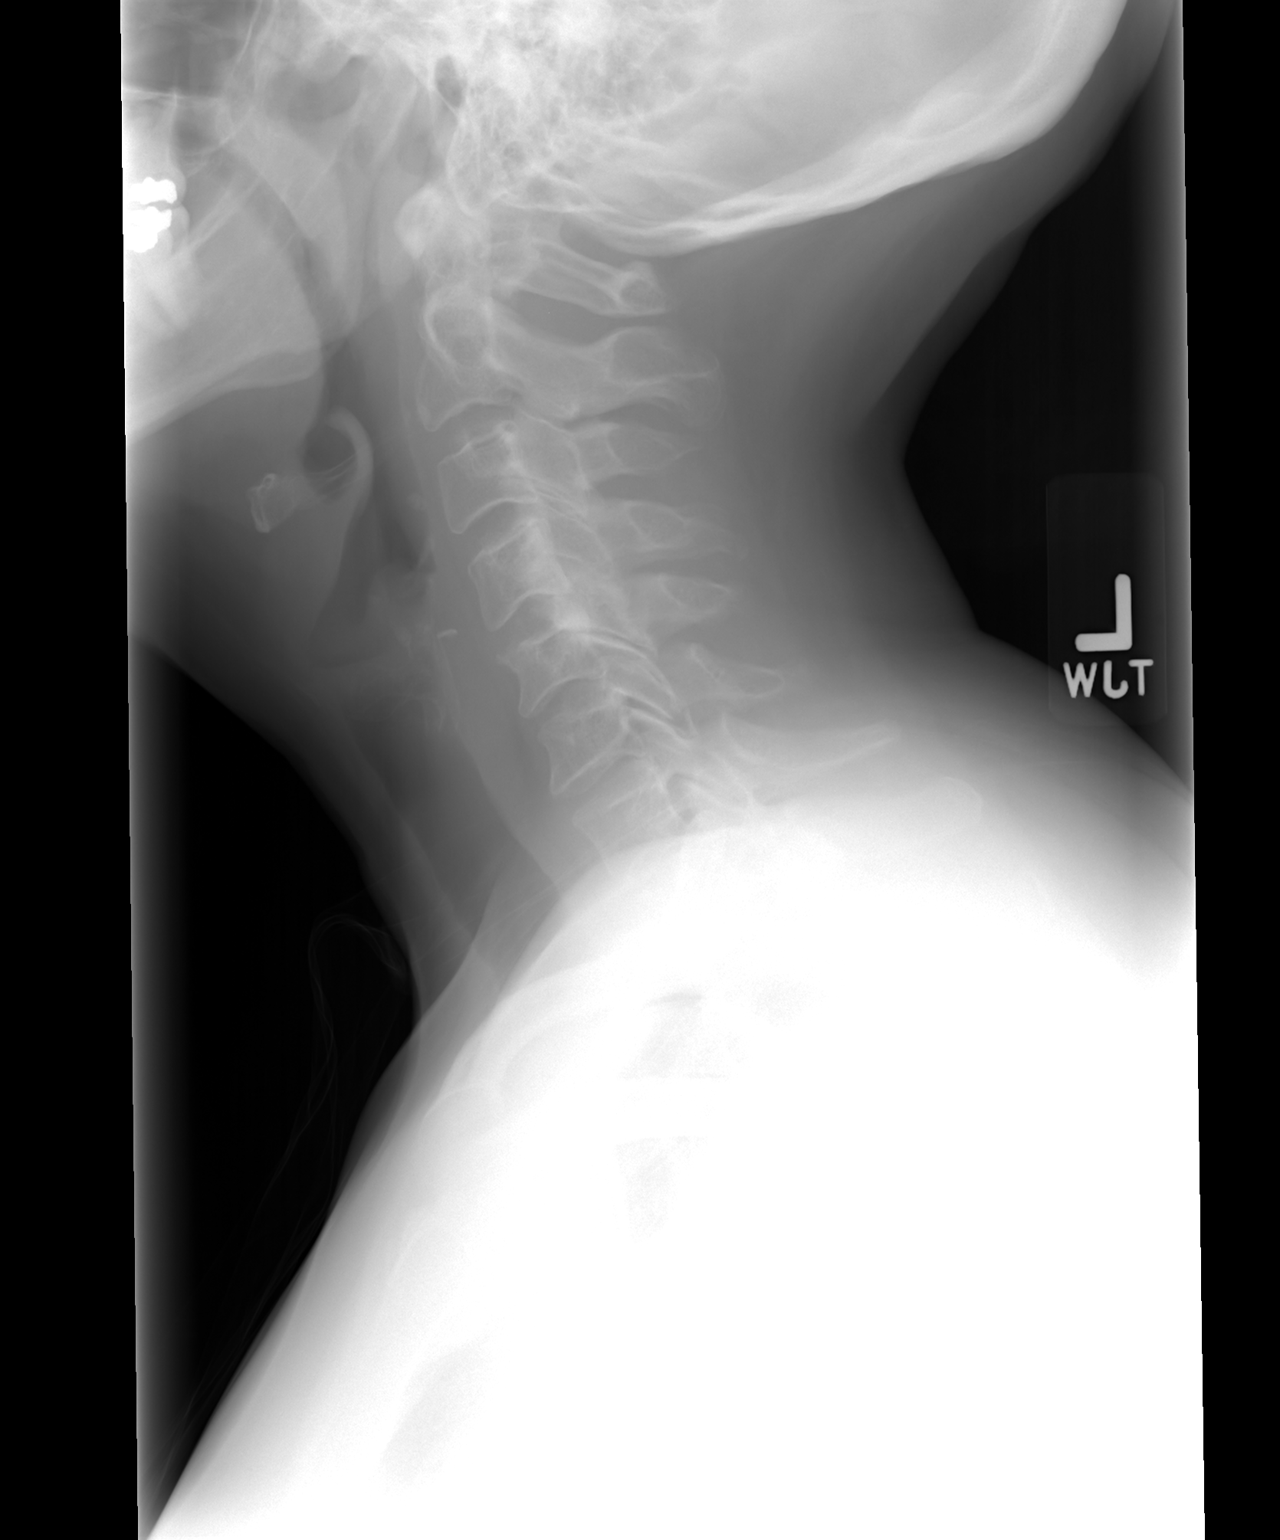

[view not recorded (3 of 3)]
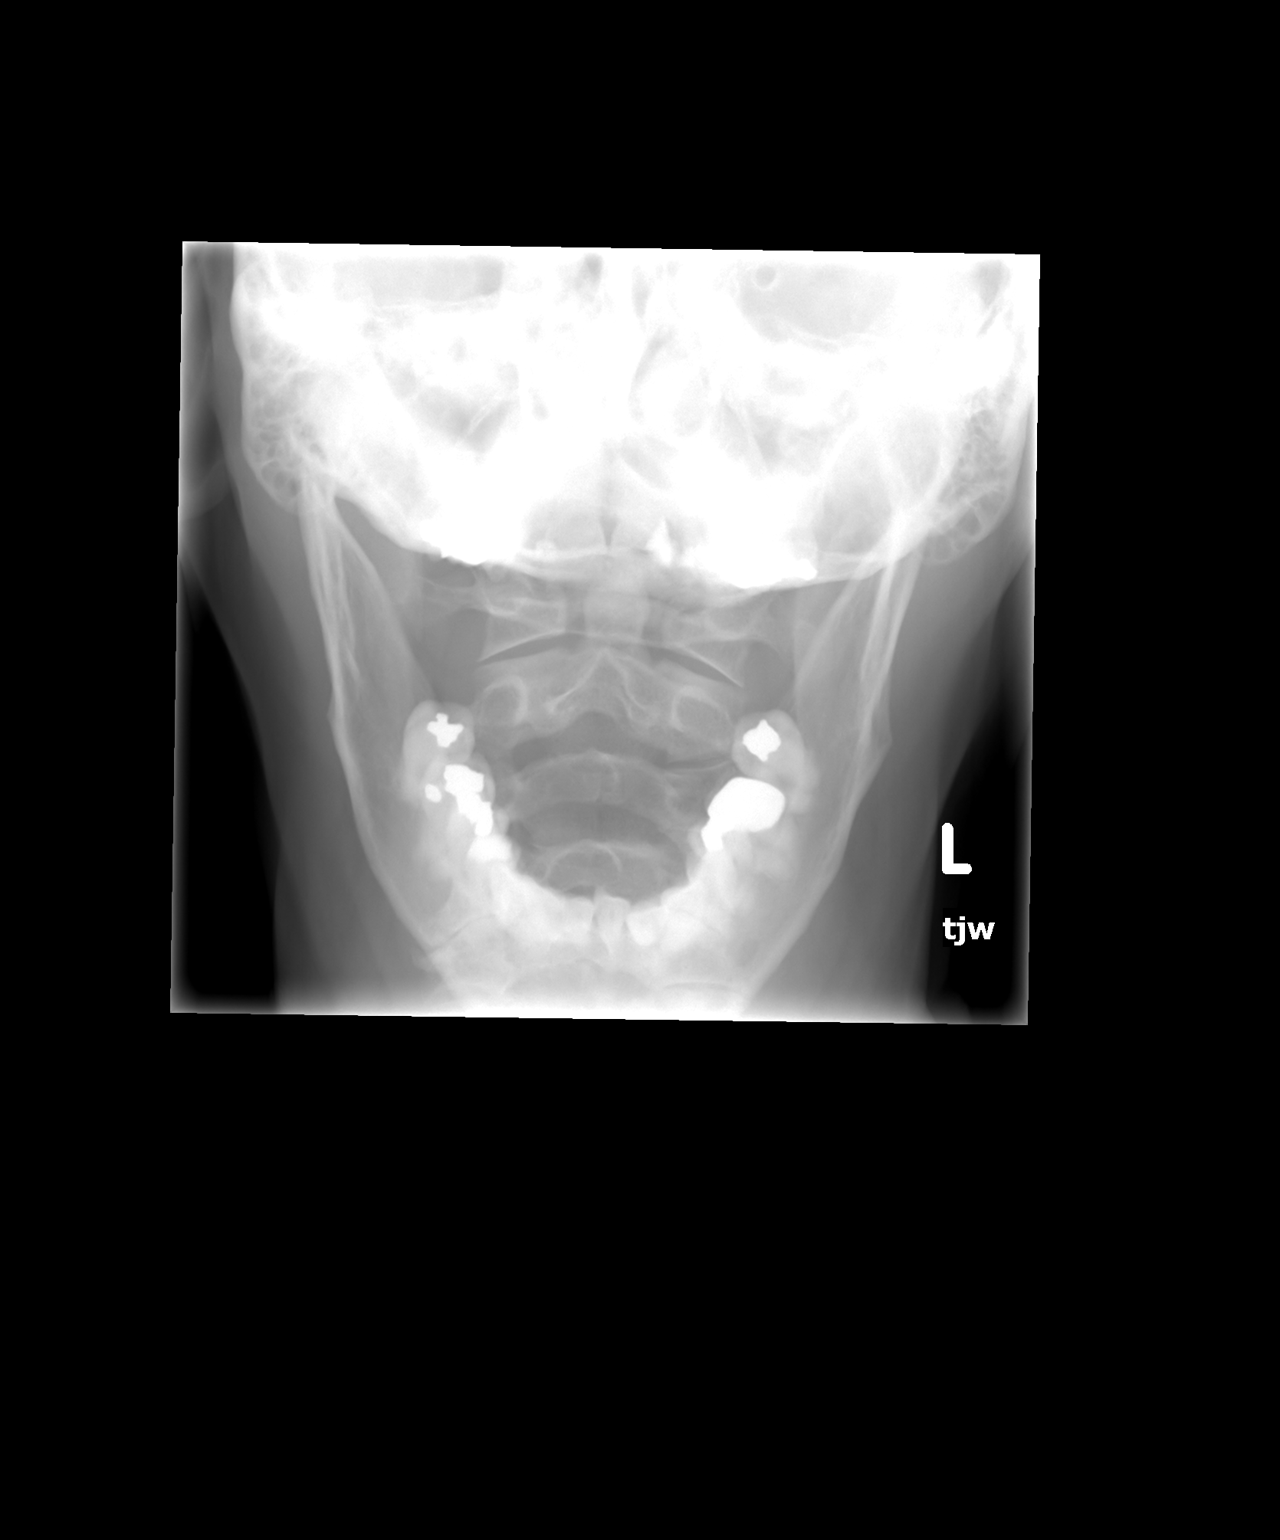

[3 of 3 positions shown; findings below may reference images not displayed]

FINDINGS: No fracture. No spondylolisthesis. The disc spaces are well
maintained. There is facet degenerative change with spurring on the
right at C4-C5.

Soft tissues are unremarkable.
IMPRESSION: 1. No fracture or acute finding.

2.  Right facet degenerative change at C4-C5.

## 2013-11-25 NOTE — Progress Notes (Signed)
Subjective:    Patient ID: Lynn Mann, female    DOB: Jan 23, 1951, 63 y.o.   MRN: [id number]  Cough  63 year old female with past history of MVP, rheumatic fever, and IBS who comes in today for scheduled follow up.    She was evaluated 5/13 by Dr Ubaldo Glassing.  Had a negative stress test and ECHO reportedly ok.  Denies any chest pain or tightness with increased activity or exertion.  Was having back pain in 8/13.  Was found to have gallstones.  Was evaluated by Dr Gerilyn Nestle ([place] - surgery).  Did not recommend surgery.  Has had no reoccurrence.  Does have a history of IBS.  Had a colonoscopy 05/05/13 that revealed 84mm polyp, diverticulosis and internal hemorrhoids.   Bowels stable.  She has had persistent cough.  States present for months.  Resolved for about a week and then "returned with the pollen".  Over the last 1-2 weeks, she has had increased cough.  Cough productive - occasionally.  Had some nasal congestion.   No sob.  Was seen at acute care.  Placed on prednisone, Levaquin and an inhaler.  Feels better.  She also reports increased neck and shoulder pain.  States has been told had osteoarthritis.  Has had physical therapy in the past.  Not doing exercise now.      Past Medical History  Diagnosis Date  . Arthritis   . Chicken pox   . Rheumatic fever   . MRSA exposure 2005    Spider bite  . Cholecystitis 11/2011    Did not require sgy - Dr. Staci Acosta - [facility]  (cholelithiasis)  . MVP (mitral valve prolapse)     Stable - Dr. Ubaldo Glassing  . Hypertension   . IBS (irritable bowel syndrome)   . H/O Clostridium difficile infection     Current Outpatient Prescriptions on File Prior to Visit  Medication Sig Dispense Refill  . albuterol (PROVENTIL HFA;VENTOLIN HFA) 108 (90 BASE) MCG/ACT inhaler Inhale 2 puffs into the lungs every 6 (six) hours as needed for wheezing.  1 Inhaler  2   No current facility-administered medications on file prior to visit.    Review of Systems Patient denies any headache,  lightheadedness or dizziness.  Some nasal congestion as outlined.  No chest pain, tightness or palpitations.  No increased shortness of breath, but does report increased cough and congestion.  On antibiotics and prednisone now.  Getting better.   No acid reflux.  No dysphagia.  No nausea or vomiting.  No abdominal pain or cramping.  No bowel change, such as diarrhea, constipation, BRBPR or melana.  Bowels stable for her.   No urine change.  Neck and should pain as outlined.        Objective:   Physical Exam  Filed Vitals:   11/25/13 0811  BP: 130/80  Pulse: 75  Temp: 98.2 F (84.38 C)   63 year old female in no acute distress.   HEENT:  Nares- clear.  Oropharynx - without lesions. NECK:  Supple.  Nontender.  No audible bruit.  Increased fullness right lateral neck.  Non tender.  HEART:  Appears to be regular. LUNGS:  No crackles or wheezing audible.  Respirations even and unlabored.  RADIAL PULSE:  Equal bilaterally.  ABDOMEN:  Soft, nontender.  Bowel sounds present and normal.  No audible abdominal bruit.    EXTREMITIES:  No increased edema present.  DP pulses palpable and equal bilaterally.       Assessment &  Plan:  LYMPHADENOPATHY.  Increased fullness right lateral neck.  Evaluated by ENT.  Just had f/u.  Had f/u ultrasound.  states everything checked out fine.      HEALTH MAINTENANCE.  Physical 01/18/13.   Colonoscopy 05/05/13 - 52mm polyp, diverticulosis and internal hemorrhoids.  Mammogram 12/13/12 - Birads II.   I spent over 25 minutes with this patient with more than 50% of the time in consultation regarding the above.

## 2013-11-25 NOTE — Progress Notes (Signed)
Pre visit review using our clinic review tool, if applicable. No additional management support is needed unless otherwise documented below in the visit note. 

## 2013-11-25 NOTE — Telephone Encounter (Signed)
Order placed for physical therapy referral for persistent knee pain.

## 2013-11-26 ENCOUNTER — Encounter: Payer: Self-pay | Admitting: Internal Medicine

## 2013-11-26 ENCOUNTER — Other Ambulatory Visit: Payer: Self-pay | Admitting: Internal Medicine

## 2013-11-26 DIAGNOSIS — E7800 Pure hypercholesterolemia, unspecified: Secondary | ICD-10-CM | POA: Insufficient documentation

## 2013-11-26 DIAGNOSIS — M542 Cervicalgia: Secondary | ICD-10-CM

## 2013-11-26 DIAGNOSIS — M25519 Pain in unspecified shoulder: Secondary | ICD-10-CM

## 2013-11-26 NOTE — Assessment & Plan Note (Signed)
Has a history of MVP.  Sees Dr Fath.   

## 2013-11-26 NOTE — Assessment & Plan Note (Signed)
Avoid increased aspirin and antiinflammatories.  Reports no acid reflux now.  Off protonix.  Did not feel it helped.  Follow.

## 2013-11-26 NOTE — Assessment & Plan Note (Signed)
Colonoscopy as outlined.  Last 05/05/13.   

## 2013-11-26 NOTE — Progress Notes (Signed)
Entered in previous order for referral by mistake.  Put in for knee pain.  Corrected order placed for referral to PT for persistent neck and shoulder pain.

## 2013-11-26 NOTE — Assessment & Plan Note (Signed)
Persistent neck pain and shoulder pain.  Has had MRI previously (2007).  Given persistent pain, will check C-spine xray.  Further w/up pending.

## 2013-11-26 NOTE — Assessment & Plan Note (Signed)
States had normal stress test within the last year.  Also reports ECHO - ok.  Has seen Dr Ubaldo Glassing in follow up.  Stable.

## 2013-11-26 NOTE — Assessment & Plan Note (Signed)
Complete the abx and prednisone.  Symptoms improved.  Will need to be cleared prior to cataract surgery.

## 2013-11-26 NOTE — Assessment & Plan Note (Signed)
Colonoscopy as outlined.  Follow.  

## 2013-11-26 NOTE — Assessment & Plan Note (Signed)
Abnormal CT scan as outlined in previous note.  F/u chest xray - negative.  Now with persistent reoccurring cough and congestion.  Given previous CT findings and persistent symptoms as outlined, will recheck CT chest to confirm clear.

## 2013-11-29 ENCOUNTER — Ambulatory Visit: Payer: Self-pay | Admitting: Internal Medicine

## 2013-11-29 IMAGING — CT CT CHEST W/O CM
2 of 3 series · 15 of 36 positions shown, 18 images · non-contrast
Comparison: Plain film [DATE]. The outside report was not
submitted.

CLINICAL DATA: Cough for 5 months. Preop for eye surgery. Abnormal
chest radiograph at doctor's office.

EXAM:
CT CHEST WITHOUT CONTRAST
TECHNIQUE: Multidetector CT imaging of the chest was performed following the
standard protocol without IV contrast..

[Series 2: routine chest wo · axial · 0.68mm/px · z∈[-388,-103]mm · 12 of 69 slices shown, 15 images]
[im 6/69  mediastinal]
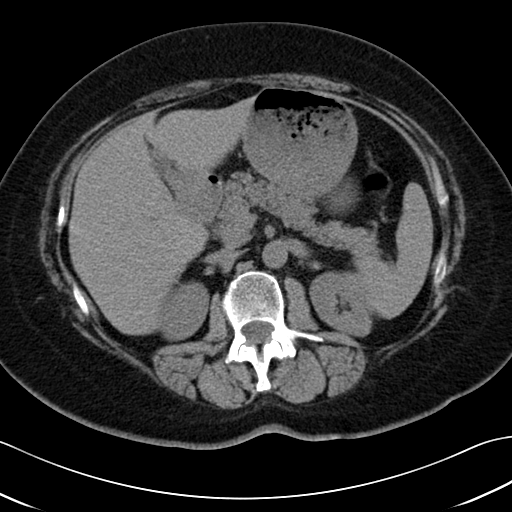
[im 6/69  lung]
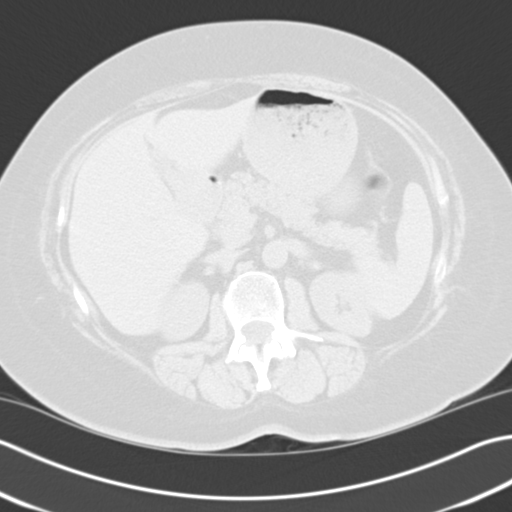
[im 11/69  lung]
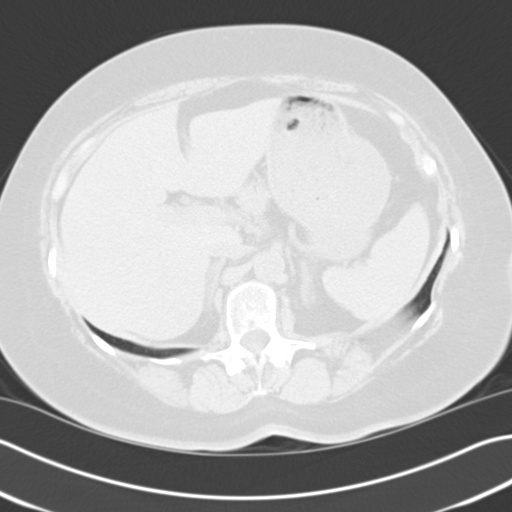
[im 16/69  lung]
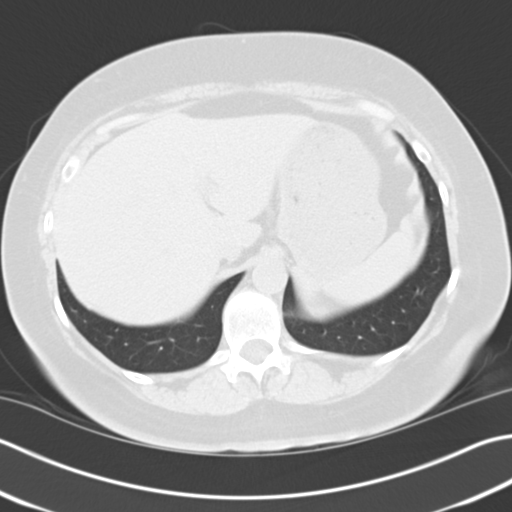
[im 21/69  lung]
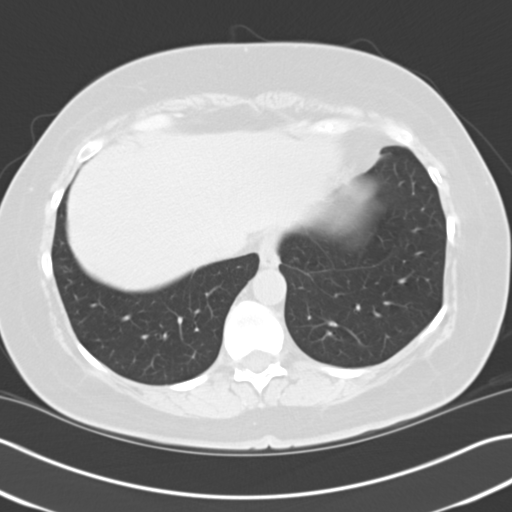
[im 26/69  mediastinal]
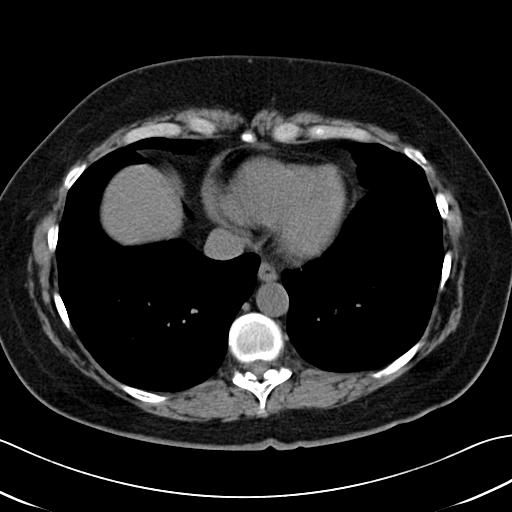
[im 26/69  lung]
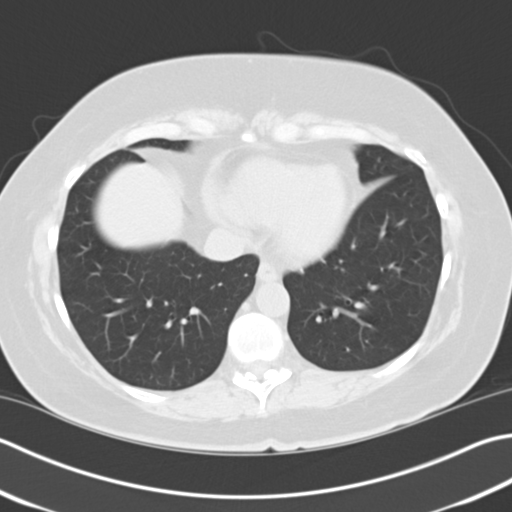
[im 31/69  lung]
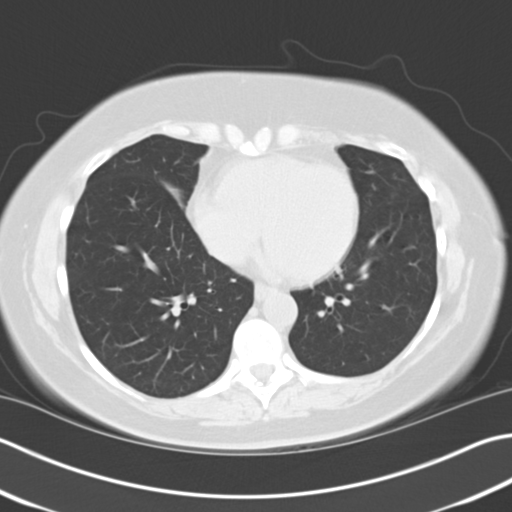
[im 38/69  lung]
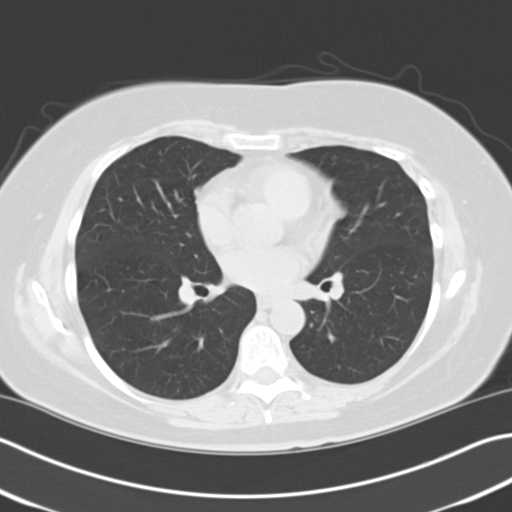
[im 43/69  lung]
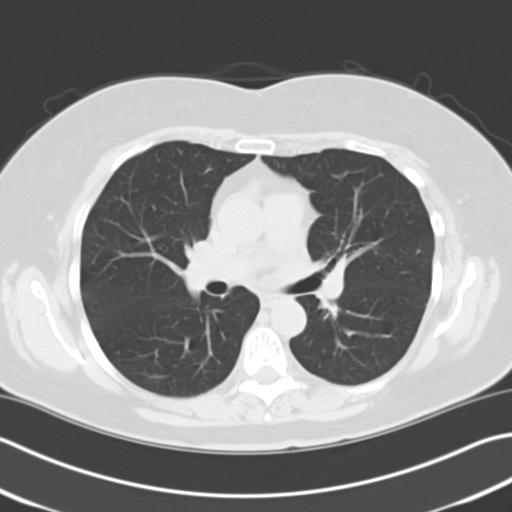
[im 48/69  mediastinal]
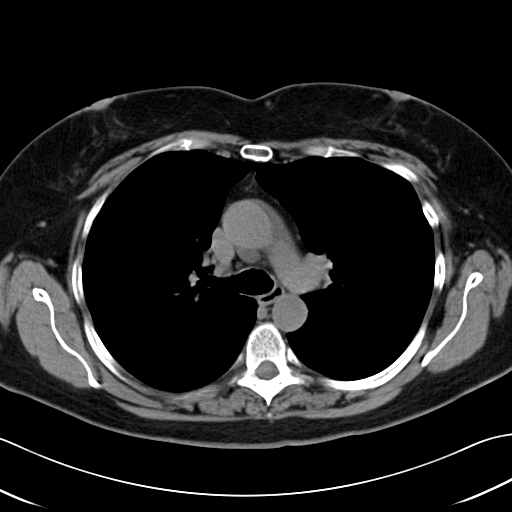
[im 48/69  lung]
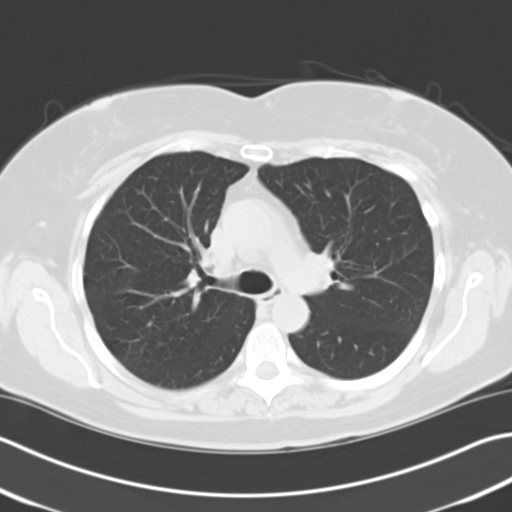
[im 53/69  lung]
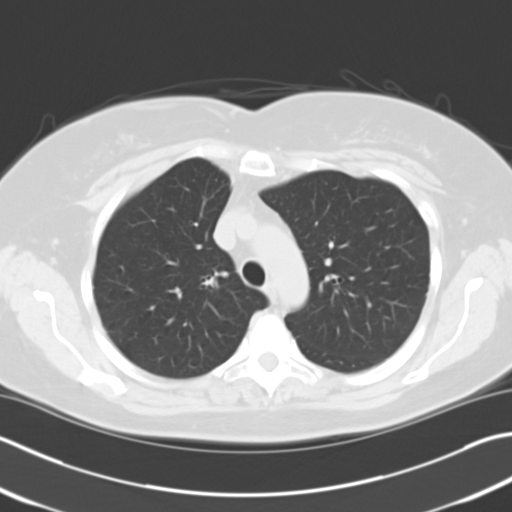
[im 58/69  lung]
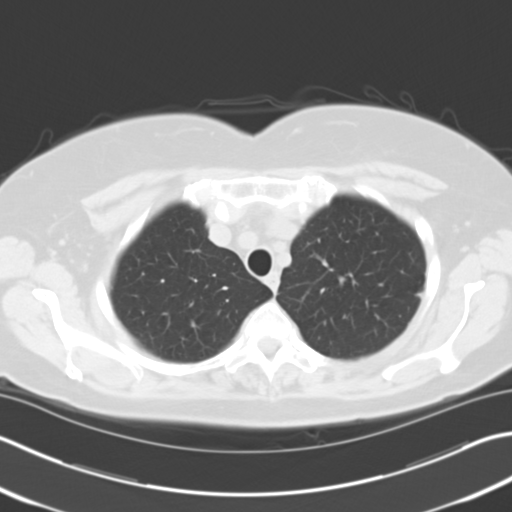
[im 63/69  lung]
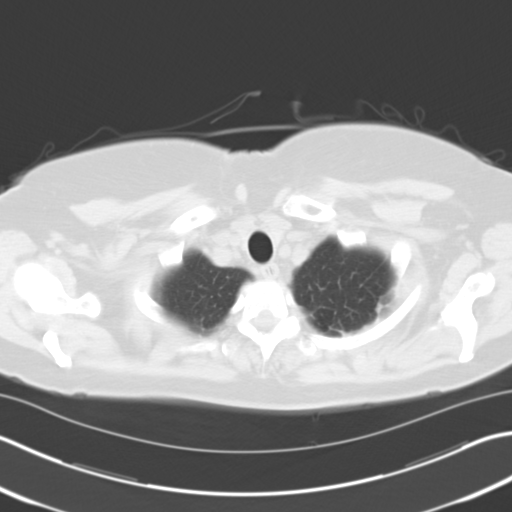

[Series 5: cor routine chest wo · coronal · 0.70mm/px · 3 of 117 slices shown]
[im 24/117  lung]
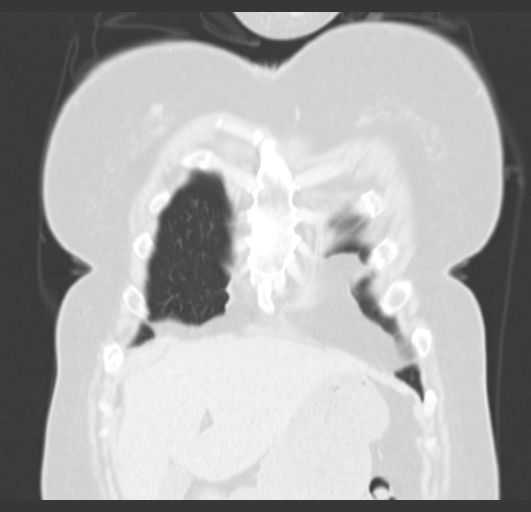
[im 47/117  lung]
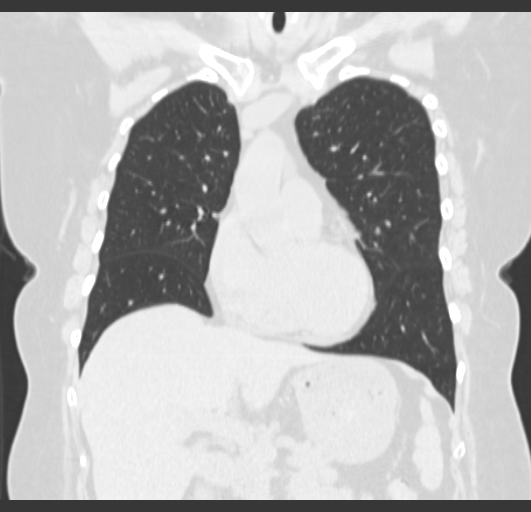
[im 70/117  lung]
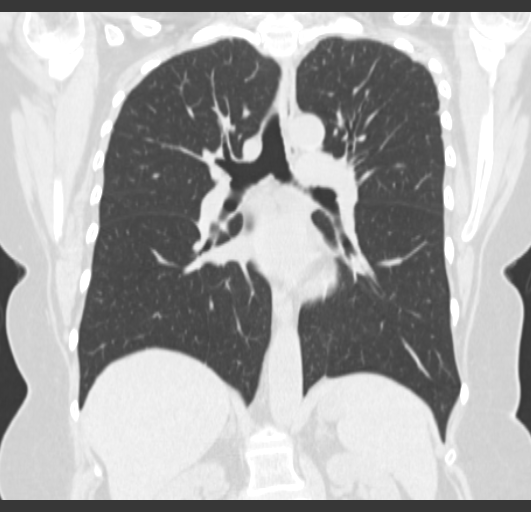

[15 of 36 positions shown; findings below may reference images not displayed]

FINDINGS: Lungs/Pleura:  Minimal biapical pleural parenchymal scarring.

No suspicious pulmonary nodule or mass.  No lobar consolidation.

No pleural fluid.

Heart/Mediastinum: Normal heart size, without pericardial effusion.
No mediastinal or definite hilar adenopathy, given limitations of
unenhanced CT.

Upper Abdomen:  No significant findings.

Bones/Musculoskeletal:  No acute osseous abnormality.
IMPRESSION: 1. Minimal biapical pleural parenchymal scarring. Otherwise, normal
noncontrast chest CT.
2. The clinical history describes an "Abnormal chest radiograph at
the doctor's office". No outside report or images are submitted. If
further clinical data becomes available, an addendum can be created.

## 2013-12-02 ENCOUNTER — Encounter: Payer: Self-pay | Admitting: *Deleted

## 2013-12-10 ENCOUNTER — Telehealth: Payer: Self-pay | Admitting: Internal Medicine

## 2013-12-10 NOTE — Telephone Encounter (Signed)
Called and advised patient of results

## 2013-12-10 NOTE — Telephone Encounter (Signed)
Please notify pt that her CT scan is ok.  Some minimal scarring, but no other abnormality.  CT - OK.

## 2013-12-15 ENCOUNTER — Other Ambulatory Visit (INDEPENDENT_AMBULATORY_CARE_PROVIDER_SITE_OTHER): Payer: BC Managed Care – PPO

## 2013-12-15 DIAGNOSIS — E7800 Pure hypercholesterolemia, unspecified: Secondary | ICD-10-CM

## 2013-12-15 DIAGNOSIS — R5383 Other fatigue: Secondary | ICD-10-CM

## 2013-12-15 DIAGNOSIS — R5381 Other malaise: Secondary | ICD-10-CM

## 2013-12-15 DIAGNOSIS — J189 Pneumonia, unspecified organism: Secondary | ICD-10-CM

## 2013-12-15 LAB — CBC WITH DIFFERENTIAL/PLATELET
Basophils Absolute: 0 10*3/uL (ref 0.0–0.1)
Basophils Relative: 0.2 % (ref 0.0–3.0)
Eosinophils Absolute: 0.1 10*3/uL (ref 0.0–0.7)
Eosinophils Relative: 2.2 % (ref 0.0–5.0)
HCT: 39.6 % (ref 36.0–46.0)
Hemoglobin: 13.2 g/dL (ref 12.0–15.0)
Lymphocytes Relative: 32.3 % (ref 12.0–46.0)
Lymphs Abs: 1.4 10*3/uL (ref 0.7–4.0)
MCHC: 33.3 g/dL (ref 30.0–36.0)
MCV: 87 fl (ref 78.0–100.0)
Monocytes Absolute: 0.3 10*3/uL (ref 0.1–1.0)
Monocytes Relative: 6.8 % (ref 3.0–12.0)
Neutro Abs: 2.5 10*3/uL (ref 1.4–7.7)
Neutrophils Relative %: 58.5 % (ref 43.0–77.0)
Platelets: 237 10*3/uL (ref 150.0–400.0)
RBC: 4.55 Mil/uL (ref 3.87–5.11)
RDW: 14 % (ref 11.5–15.5)
WBC: 4.3 10*3/uL (ref 4.0–10.5)

## 2013-12-15 LAB — COMPREHENSIVE METABOLIC PANEL
ALT: 19 U/L (ref 0–35)
AST: 18 U/L (ref 0–37)
Albumin: 3.8 g/dL (ref 3.5–5.2)
Alkaline Phosphatase: 52 U/L (ref 39–117)
BUN: 8 mg/dL (ref 6–23)
CO2: 27 mEq/L (ref 19–32)
Calcium: 9.2 mg/dL (ref 8.4–10.5)
Chloride: 109 mEq/L (ref 96–112)
Creatinine, Ser: 0.7 mg/dL (ref 0.4–1.2)
GFR: 87.45 mL/min (ref >60.00)
Glucose, Bld: 111 mg/dL — ABNORMAL HIGH (ref 70–99)
Potassium: 4.1 mEq/L (ref 3.5–5.1)
Sodium: 143 mEq/L (ref 135–145)
Total Bilirubin: 1 mg/dL (ref 0.2–1.2)
Total Protein: 6.4 g/dL (ref 6.0–8.3)

## 2013-12-15 LAB — LIPID PANEL
Cholesterol: 216 mg/dL — ABNORMAL HIGH (ref 0–200)
HDL: 38.6 mg/dL — ABNORMAL LOW (ref >39.00)
LDL Cholesterol: 130 mg/dL — ABNORMAL HIGH (ref 0–99)
Total CHOL/HDL Ratio: 6
Triglycerides: 235 mg/dL — ABNORMAL HIGH (ref 0.0–149.0)
VLDL: 47 mg/dL — ABNORMAL HIGH (ref 0.0–40.0)

## 2013-12-15 LAB — TSH: TSH: 3.67 u[IU]/mL (ref 0.35–4.50)

## 2013-12-17 ENCOUNTER — Other Ambulatory Visit: Payer: Self-pay | Admitting: Internal Medicine

## 2013-12-17 ENCOUNTER — Other Ambulatory Visit: Payer: Self-pay | Admitting: *Deleted

## 2013-12-17 ENCOUNTER — Encounter: Payer: Self-pay | Admitting: *Deleted

## 2013-12-17 ENCOUNTER — Telehealth: Payer: Self-pay | Admitting: *Deleted

## 2013-12-17 DIAGNOSIS — R739 Hyperglycemia, unspecified: Secondary | ICD-10-CM

## 2013-12-17 DIAGNOSIS — E7800 Pure hypercholesterolemia, unspecified: Secondary | ICD-10-CM

## 2013-12-17 MED ORDER — PRAVASTATIN SODIUM 10 MG PO TABS: 10.0000 mg | ORAL_TABLET | Freq: Every day | ORAL | Status: DC

## 2013-12-17 NOTE — Progress Notes (Signed)
Cancelled liver panel

## 2013-12-17 NOTE — Telephone Encounter (Signed)
(  See lab results)-Pt called back & received lab results. Pt has declined starting on Pravastatin at this time because she has family member that couldn't tolerate statin drugs. Pt kept lab appt in 6 weeks to recheck sugars. (please cancel order for liver panel)

## 2013-12-17 NOTE — Telephone Encounter (Signed)
Liver panel cancelled.

## 2013-12-17 NOTE — Progress Notes (Signed)
Order placed for f/u lab.   

## 2013-12-27 ENCOUNTER — Encounter: Payer: Self-pay | Admitting: Internal Medicine

## 2014-01-07 ENCOUNTER — Ambulatory Visit: Payer: Self-pay | Admitting: Internal Medicine

## 2014-01-07 LAB — HM MAMMOGRAPHY: HM Mammogram: NEGATIVE

## 2014-01-07 IMAGING — MG MM DIGITAL SCREENING BILAT W/ CAD
1 series · 5 of 5 positions shown · non-contrast
Comparison: None.

CLINICAL DATA: Screening.

EXAM:
DIGITAL SCREENING BILATERAL MAMMOGRAM WITH CAD

[R CC · right · 5 of 5 slices shown]
[im 1/5]
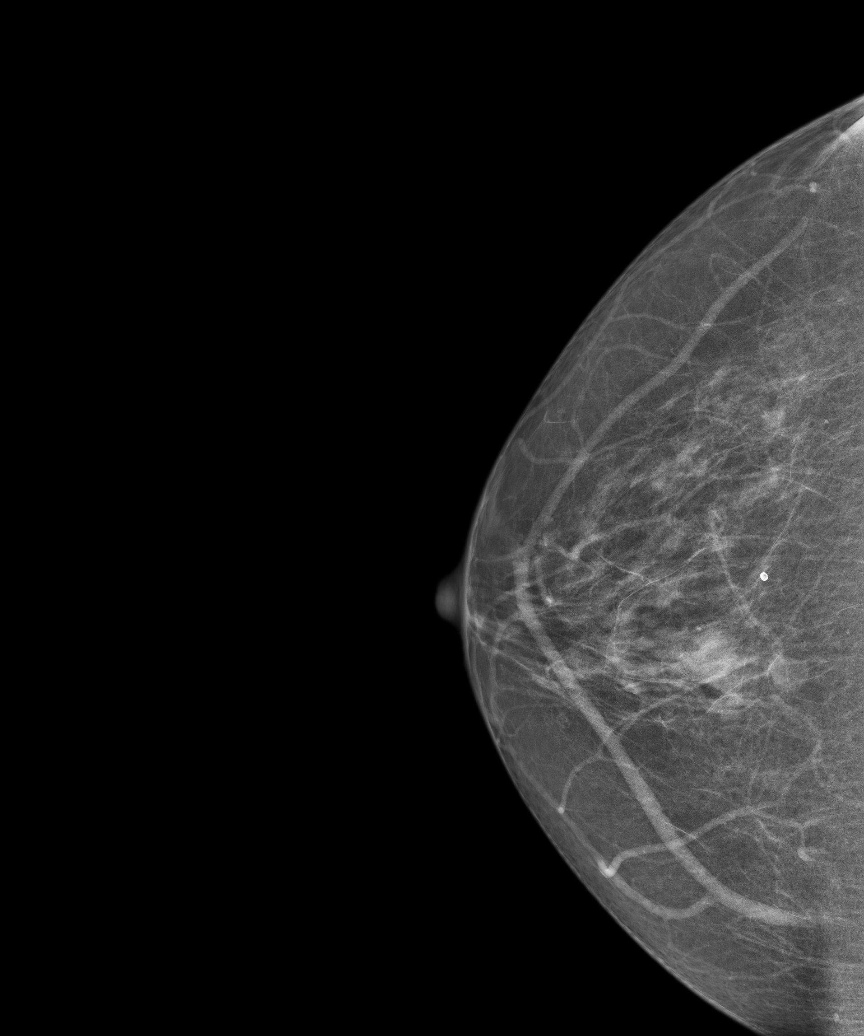
[im 2/5]
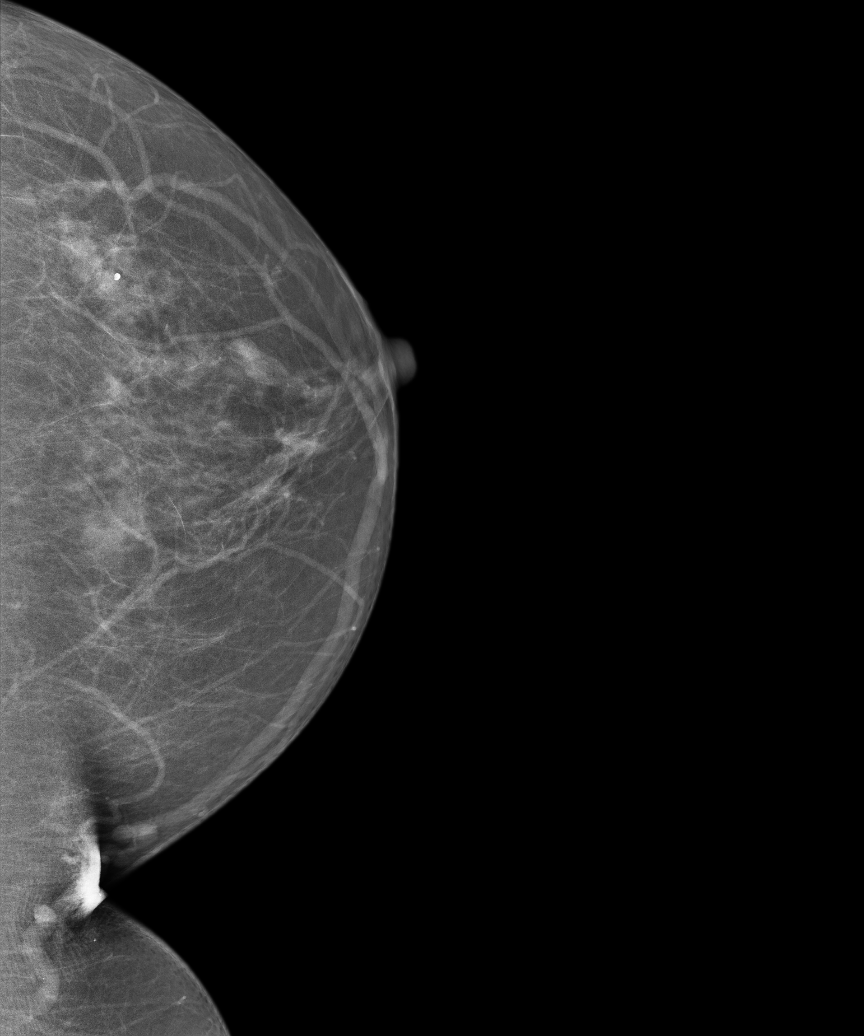
[im 3/5]
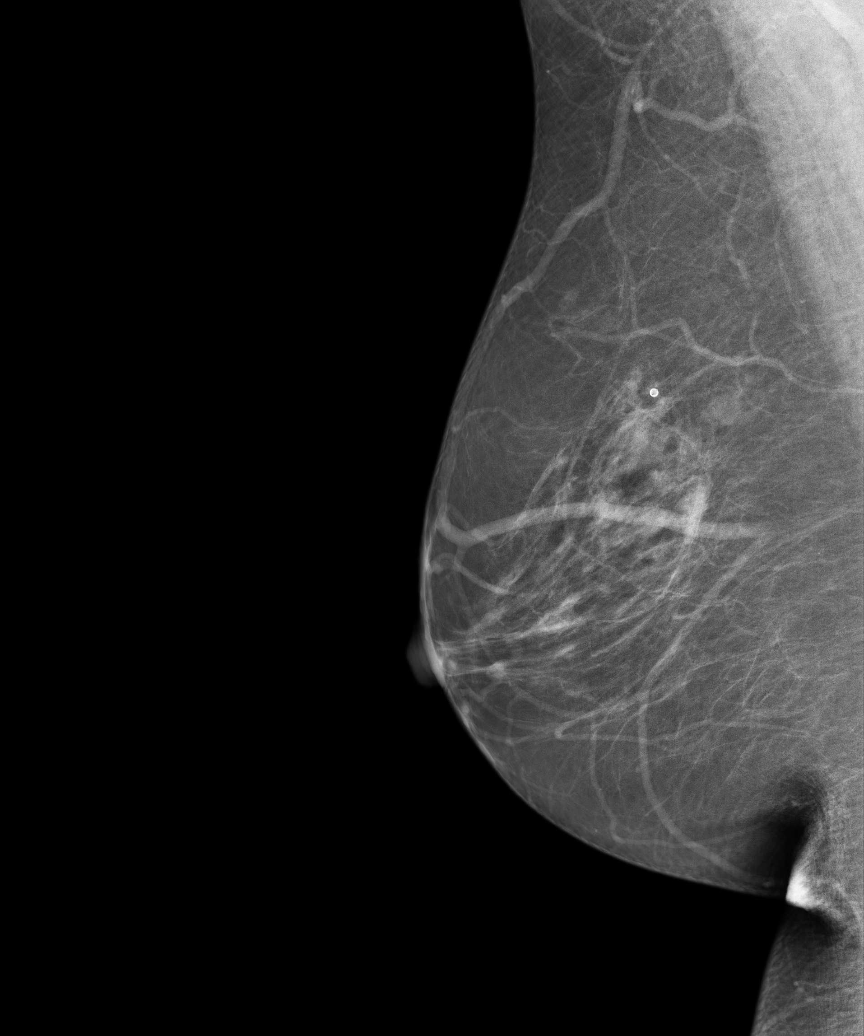
[im 4/5]
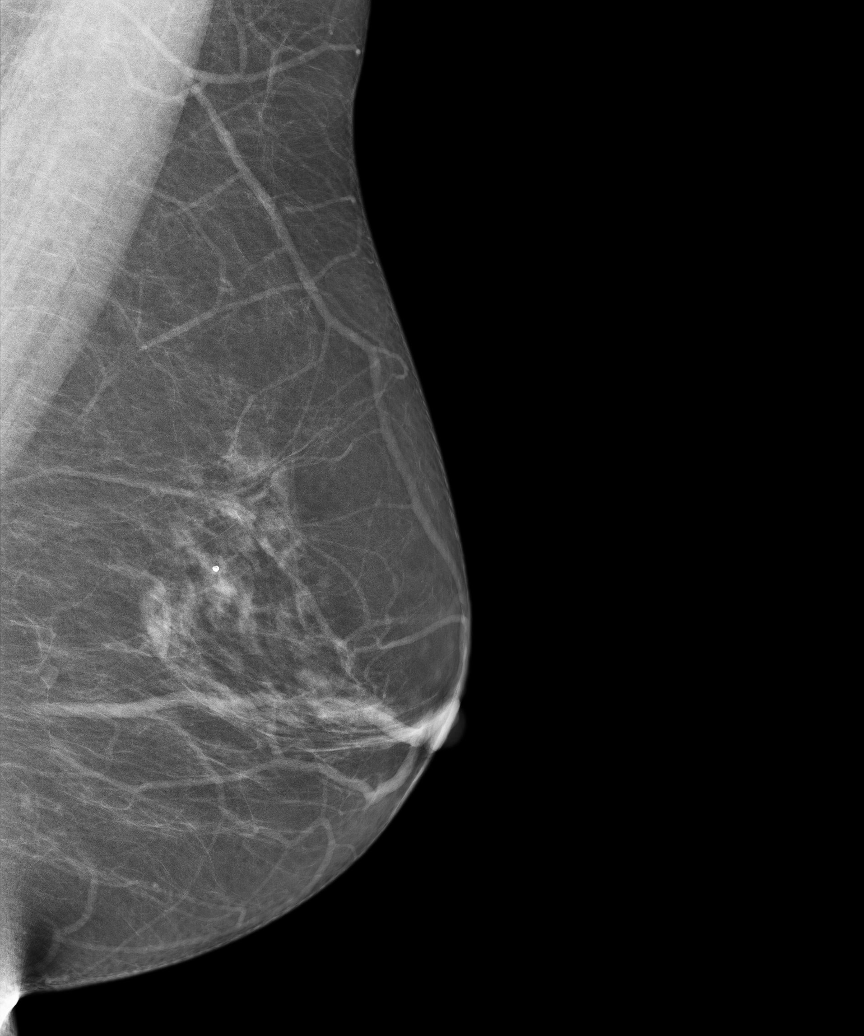
[im 5/5]
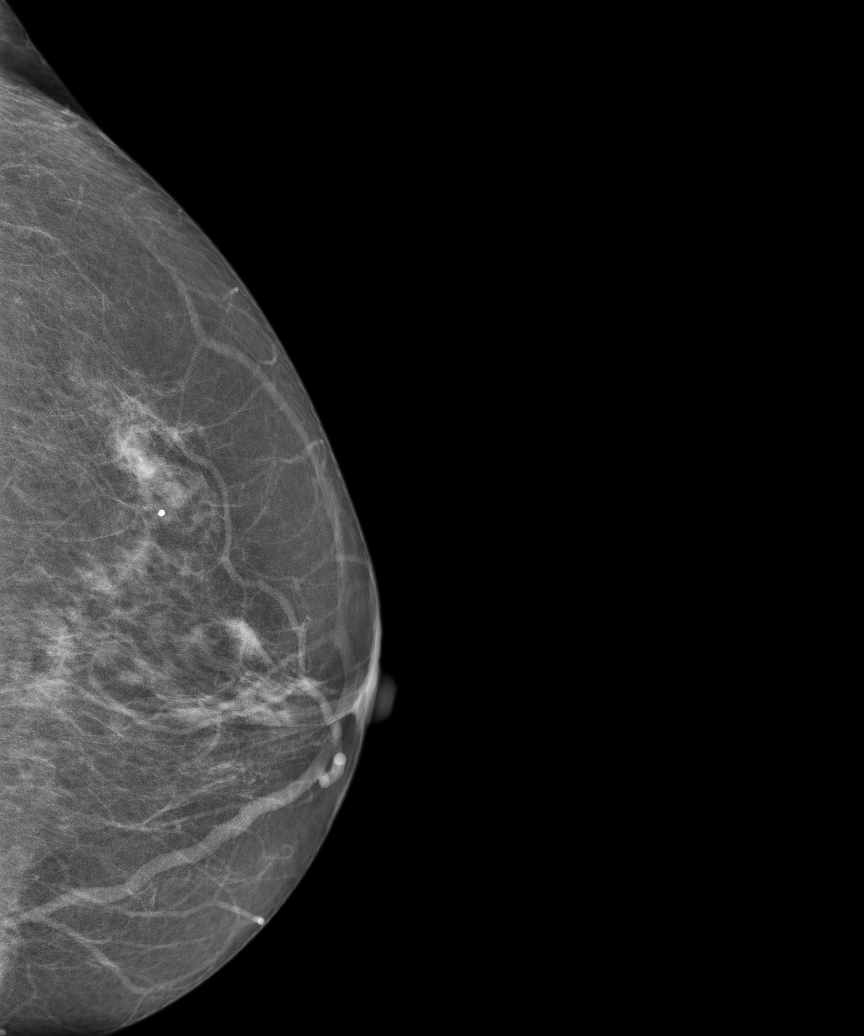

[5 of 5 positions shown; findings below may reference images not displayed]

ACR Breast Density Category b: There are scattered areas of
fibroglandular density.
FINDINGS: There are no findings suspicious for malignancy. Images were
processed with CAD.
IMPRESSION: No mammographic evidence of malignancy. A result letter of this
screening mammogram will be mailed directly to the patient.

RECOMMENDATION:
Screening mammogram in one year. (Code:[GD])

BI-RADS CATEGORY  1: Negative.

## 2014-01-10 ENCOUNTER — Encounter: Payer: Self-pay | Admitting: Internal Medicine

## 2014-01-31 ENCOUNTER — Other Ambulatory Visit (INDEPENDENT_AMBULATORY_CARE_PROVIDER_SITE_OTHER): Payer: BC Managed Care – PPO

## 2014-01-31 DIAGNOSIS — R7309 Other abnormal glucose: Secondary | ICD-10-CM

## 2014-01-31 DIAGNOSIS — R739 Hyperglycemia, unspecified: Secondary | ICD-10-CM

## 2014-01-31 LAB — HEMOGLOBIN A1C: Hgb A1c MFr Bld: 5.8 % (ref 4.6–6.5)

## 2014-02-01 LAB — GLUCOSE, FASTING: Glucose, Fasting: 109 mg/dL — ABNORMAL HIGH (ref 70–99)

## 2014-02-03 ENCOUNTER — Encounter: Payer: Self-pay | Admitting: Internal Medicine

## 2014-02-03 ENCOUNTER — Ambulatory Visit (INDEPENDENT_AMBULATORY_CARE_PROVIDER_SITE_OTHER): Payer: BC Managed Care – PPO | Admitting: Internal Medicine

## 2014-02-03 VITALS — BP 112/80 | HR 73 | Temp 98.1°F | Ht 67.5 in | Wt 186.8 lb

## 2014-02-03 DIAGNOSIS — M542 Cervicalgia: Secondary | ICD-10-CM

## 2014-02-03 DIAGNOSIS — K589 Irritable bowel syndrome without diarrhea: Secondary | ICD-10-CM

## 2014-02-03 DIAGNOSIS — Z86010 Personal history of colonic polyps: Secondary | ICD-10-CM

## 2014-02-03 DIAGNOSIS — Z8619 Personal history of other infectious and parasitic diseases: Secondary | ICD-10-CM

## 2014-02-03 DIAGNOSIS — R17 Unspecified jaundice: Secondary | ICD-10-CM

## 2014-02-03 DIAGNOSIS — E7800 Pure hypercholesterolemia, unspecified: Secondary | ICD-10-CM

## 2014-02-03 DIAGNOSIS — K219 Gastro-esophageal reflux disease without esophagitis: Secondary | ICD-10-CM

## 2014-02-03 DIAGNOSIS — I059 Rheumatic mitral valve disease, unspecified: Secondary | ICD-10-CM

## 2014-02-03 DIAGNOSIS — Z8679 Personal history of other diseases of the circulatory system: Secondary | ICD-10-CM

## 2014-02-03 DIAGNOSIS — R739 Hyperglycemia, unspecified: Secondary | ICD-10-CM

## 2014-02-03 DIAGNOSIS — I341 Nonrheumatic mitral (valve) prolapse: Secondary | ICD-10-CM

## 2014-02-03 DIAGNOSIS — R7309 Other abnormal glucose: Secondary | ICD-10-CM

## 2014-02-03 NOTE — Progress Notes (Signed)
Pre visit review using our clinic review tool, if applicable. No additional management support is needed unless otherwise documented below in the visit note. 

## 2014-02-07 ENCOUNTER — Encounter: Payer: Self-pay | Admitting: Internal Medicine

## 2014-02-07 DIAGNOSIS — R739 Hyperglycemia, unspecified: Secondary | ICD-10-CM | POA: Insufficient documentation

## 2014-02-07 NOTE — Assessment & Plan Note (Signed)
Colonoscopy as outlined.  Follow.  Bowels stable.

## 2014-02-07 NOTE — Assessment & Plan Note (Signed)
Colonoscopy as outlined.  Last 05/05/13.

## 2014-02-07 NOTE — Assessment & Plan Note (Signed)
Cholesterol checked 12/15/13 revealed total cholesterol 216, triglycerides 235, HDL 39 and LDL 130.  Low cholesterol diet.  Discussed cholesterol medication.  She declines.  Follow.

## 2014-02-07 NOTE — Assessment & Plan Note (Signed)
Follow liver panel today to confirm wnl.

## 2014-02-07 NOTE — Assessment & Plan Note (Signed)
Has a history of MVP.  Sees Dr Ubaldo Glassing.

## 2014-02-07 NOTE — Assessment & Plan Note (Signed)
Avoid increased aspirin and antiinflammatories.  Reports no acid reflux now.  Off protonix.  Did not feel it helped.  Follow.  Symptoms controlled.

## 2014-02-07 NOTE — Assessment & Plan Note (Signed)
Initial abnormal CT scan as outlined in previous note.  F/u chest xray - negative.  F/u chest CT 11/29/13 - ok.  Currently doing well.  No increased cough or congestion.

## 2014-02-07 NOTE — Progress Notes (Signed)
Subjective:    Patient ID: Lynn Mann, female    DOB: [birth year], 63 y.o.   MRN: [id number]  HPI 63 year old female with past history of MVP, rheumatic fever, and IBS who comes in today to follow up on these issues as well as for a complete physical exam.   She was evaluated 5/13 by Dr Ubaldo Glassing.  Had a negative stress test and ECHO reportedly ok.  Denies any chest pain or tightness with increased activity or exertion.  Was having back pain in 8/13.  Was found to have gallstones.  Was evaluated by Dr Gerilyn Nestle ([place] - surgery).  Did not recommend surgery.  Has had no reoccurrence.  Does have a history of IBS.  Just had f/u colonoscopy 05/05/13.  States overall she feels she is doing well.  Tries to stay active.  Eating and drinking well.  No nausea or vomiting.  No bowel change.  Cough not an issue for her now. Had f/u chest CT 12/09/13 - ok.  Cholesterol elevated.  Discussed the importance of diet and exercise.  Discussed cholesterol medication.  She prefers to work on diet and exercise and hold on medication.     Past Medical History  Diagnosis Date  . Arthritis   . Chicken pox   . Rheumatic fever   . MRSA exposure 2005    Spider bite  . Cholecystitis 11/2011    Did not require sgy - Dr. Staci Acosta - [facility]  (cholelithiasis)  . MVP (mitral valve prolapse)     Stable - Dr. Ubaldo Glassing  . Hypertension   . IBS (irritable bowel syndrome)   . H/O Clostridium difficile infection     Current Outpatient Prescriptions on File Prior to Visit  Medication Sig Dispense Refill  . albuterol (PROVENTIL HFA;VENTOLIN HFA) 108 (90 BASE) MCG/ACT inhaler Inhale 2 puffs into the lungs every 6 (six) hours as needed for wheezing.  1 Inhaler  2  . fluticasone (FLONASE) 50 MCG/ACT nasal spray Place 2 sprays into both nostrils daily.       No current facility-administered medications on file prior to visit.    Review of Systems Patient denies any headache, lightheadedness or dizziness.  No sinus or allergy symptoms.  No  chest pain, tightness or palpitations.  No increased shortness of breath, cough or congestion.  No acid reflux.  No dysphagia.  No nausea or vomiting.  No abdominal pain or cramping.  No bowel change, such as diarrhea, constipation, BRBPR or melana.  Bowels stable for her.   No urine change.   Increased cholesterol as outlined.        Objective:   Physical Exam  Filed Vitals:   02/03/14 1015  BP: 112/80  Pulse: 73  Temp: 98.1 F (36.7 C)   Blood pressure recheck:  64/35  63 year old female in no acute distress.   HEENT:  Nares- clear.  Oropharynx - without lesions. NECK:  Supple.  Nontender.  No audible bruit.  HEART:  Appears to be regular. LUNGS:  No crackles or wheezing audible.  Respirations even and unlabored.  RADIAL PULSE:  Equal bilaterally.    BREASTS:  No nipple discharge or nipple retraction present.  Could not appreciate any distinct nodules or axillary adenopathy.  ABDOMEN:  Soft, nontender.  Bowel sounds present and normal.  No audible abdominal bruit.  GU:  Not performed.     EXTREMITIES:  No increased edema present.  DP pulses palpable and equal bilaterally.  Assessment & Plan:  HEALTH MAINTENANCE.  Physical today.  PAP 01/18/13 negative with negative HPV.  Mammogram 01/07/14 - Birads I.  Colonoscopy 05/05/13.      I spent 25 minutes with this patient with more than 50% of the time in consultation regarding the above.

## 2014-02-07 NOTE — Assessment & Plan Note (Signed)
a1c just checked - 5.9.  Follow.  Low carb diet.

## 2014-02-07 NOTE — Assessment & Plan Note (Signed)
States had normal stress test within the couple of years.  Also reports ECHO - ok.  Has seen Dr Ubaldo Glassing in follow up.  Stable.

## 2014-02-07 NOTE — Assessment & Plan Note (Signed)
Persistent neck pain and shoulder pain.  Has had MRI previously (2007).  Given persistent pain, C-spine xray ordered.  This revealed degenerative change C4-C5.  Was referred to physical therapy.  Had to postpone.  Will notify me when agreeable for referral.

## 2014-04-08 ENCOUNTER — Encounter: Payer: Self-pay | Admitting: Internal Medicine

## 2014-04-08 ENCOUNTER — Telehealth: Payer: Self-pay | Admitting: Internal Medicine

## 2014-04-08 ENCOUNTER — Ambulatory Visit (INDEPENDENT_AMBULATORY_CARE_PROVIDER_SITE_OTHER): Payer: BC Managed Care – PPO | Admitting: Internal Medicine

## 2014-04-08 VITALS — BP 108/60 | HR 83 | Temp 98.3°F | Wt 183.0 lb

## 2014-04-08 DIAGNOSIS — R197 Diarrhea, unspecified: Secondary | ICD-10-CM

## 2014-04-08 MED ORDER — HYOSCYAMINE SULFATE 0.125 MG SL SUBL: 0.1250 mg | SUBLINGUAL_TABLET | Freq: Three times a day (TID) | SUBLINGUAL | Status: DC | PRN

## 2014-04-08 NOTE — Assessment & Plan Note (Addendum)
Pattern is that of IBS with cramping, pain after eating which is relieved by defecation No blood, etc. Brief fever--but overall this doesn't seem to be infectious. (and no recent antibiotics) Recent colonoscopy is reassuring Fiber tends to worsen her diarrhea Has been taking yogurt with live cultures ---but discussed adding probiotic supplement Has vacation planned---will rx small quantity of hyoscyamine  Has bentyl in past If ineffective, can try the imodium at least till done with vacation  Asked her to try to eat more normally Can use ensure if food is not tolerated--to be sure nutritionally replete

## 2014-04-08 NOTE — Progress Notes (Signed)
Subjective:    Patient ID: Lynn Mann, female    DOB: [birth year], 63 y.o.   MRN: [id number]  HPI Having trouble with her bowels Will have regular loose stools and often at least 3 times a day  This is worse in the past 2 weeks Watery and more often--- after every meal within an hour (and abdominal pain) Has been on broth, etc and now bland diet Reintroduced solid foods but worsened again  Using imodium --used 2 one day and 1 on other days (does use this intermittently in the past)  Has had fever in past couple of days Normal is 97-- now up to 100 More gassy and bloated  Had some nausea and vomiting 1 night about a week ago Slight nausea otherwise but not much Not much appetite till 2-3 days ago--it improved then No blood in stool--but slight red on toilet paper (from irritation)  Eating yogurt regularly Current Outpatient Prescriptions on File Prior to Visit  Medication Sig Dispense Refill  . albuterol (PROVENTIL HFA;VENTOLIN HFA) 108 (90 BASE) MCG/ACT inhaler Inhale 2 puffs into the lungs every 6 (six) hours as needed for wheezing.  1 Inhaler  2  . fluticasone (FLONASE) 50 MCG/ACT nasal spray Place 2 sprays into both nostrils daily.       No current facility-administered medications on file prior to visit.    Allergies  Allergen Reactions  . Adhesive [Tape]   . Yellow Dyes (Non-Tartrazine) Other (See Comments)    Arthritis pains    Past Medical History  Diagnosis Date  . Arthritis   . Chicken pox   . Rheumatic fever   . MRSA exposure 2005    Spider bite  . Cholecystitis 11/2011    Did not require sgy - Dr. Staci Acosta - [facility]  (cholelithiasis)  . MVP (mitral valve prolapse)     Stable - Dr. Ubaldo Glassing  . Hypertension   . IBS (irritable bowel syndrome)   . H/O Clostridium difficile infection     Past Surgical History  Procedure Laterality Date  . Muscle biopsy      Family History  Problem Relation Age of Onset  . Arthritis Mother   . Stroke Mother   .  Diabetes Mother   . Hypertension Mother   . Arthritis Father   . Stroke Father   . Diabetes Paternal Grandmother   . Cancer Paternal Uncle     colon  . Breast cancer Maternal Aunt   . Heart disease      maternal and paternal side    History   Social History  . Marital Status: Married    Spouse Name: N/A    Number of Children: N/A  . Years of Education: 16   Occupational History  . Retired    Social History Main Topics  . Smoking status: Never Smoker   . Smokeless tobacco: Never Used  . Alcohol Use: Yes     Comment: Rarely  . Drug Use: No  . Sexual Activity: Yes    Birth Control/ Protection: Post-menopausal   Other Topics Concern  . Not on file   Social History Narrative  . No narrative on file   Review of Systems No recent travel No antibiotic since May  No cough or breathing problems    Objective:   Physical Exam  Constitutional: She appears well-developed and well-nourished. No distress.  Neck: Normal range of motion. Neck supple. No thyromegaly present.  Pulmonary/Chest: Effort normal and breath sounds normal. No respiratory  distress. She has no wheezes. She has no rales.  Abdominal: Soft. Bowel sounds are normal. She exhibits no distension. There is no rebound and no guarding.  Mild tenderness along lower abdomen  Lymphadenopathy:    She has no cervical adenopathy.          Assessment & Plan:

## 2014-04-08 NOTE — Telephone Encounter (Signed)
Patient Information:  Caller Name: Zhuri  Phone: [phone]  Patient: Lynn Mann, Lynn Mann  Gender: Female  DOB: 02/24/1951  Age: 63 Years  PCP: Einar Pheasant  Office Follow Up:  Does the office need to follow up with this patient?: No  Instructions For The Office: N/A   Symptoms  Reason For Call & Symptoms: Pt has diarrhea/loose stools with vomiting x1.  Reviewed Health History In EMR: Yes  Reviewed Medications In EMR: Yes  Reviewed Allergies In EMR: Yes  Reviewed Surgeries / Procedures: Yes  Date of Onset of Symptoms: 03/28/2014  Guideline(s) Used:  Diarrhea  Disposition Per Guideline:   Go to Office Now  Reason For Disposition Reached:   Age > 60 years and has had > 6 diarrhea stools in past 24 hours  Advice Given:  Call Back If:  You become worse.  Patient Will Follow Care Advice:  YES  Appointment Scheduled:  04/08/2014 11:15:00 Appointment Scheduled Provider:  Other

## 2014-04-08 NOTE — Progress Notes (Signed)
Pre visit review using our clinic review tool, if applicable. No additional management support is needed unless otherwise documented below in the visit note. 

## 2014-06-14 ENCOUNTER — Other Ambulatory Visit (INDEPENDENT_AMBULATORY_CARE_PROVIDER_SITE_OTHER): Payer: BC Managed Care – PPO

## 2014-06-14 DIAGNOSIS — R739 Hyperglycemia, unspecified: Secondary | ICD-10-CM

## 2014-06-14 DIAGNOSIS — E7800 Pure hypercholesterolemia, unspecified: Secondary | ICD-10-CM

## 2014-06-14 LAB — LIPID PANEL
Cholesterol: 214 mg/dL — ABNORMAL HIGH (ref 0–200)
HDL: 42.3 mg/dL (ref >39.00)
LDL Cholesterol: 150 mg/dL — ABNORMAL HIGH (ref 0–99)
NonHDL: 171.7
Total CHOL/HDL Ratio: 5
Triglycerides: 107 mg/dL (ref 0.0–149.0)
VLDL: 21.4 mg/dL (ref 0.0–40.0)

## 2014-06-14 LAB — HEPATIC FUNCTION PANEL
ALT: 30 U/L (ref 0–35)
AST: 30 U/L (ref 0–37)
Albumin: 4 g/dL (ref 3.5–5.2)
Alkaline Phosphatase: 52 U/L (ref 39–117)
Bilirubin, Direct: 0.1 mg/dL (ref 0.0–0.3)
Total Bilirubin: 0.8 mg/dL (ref 0.2–1.2)
Total Protein: 6.8 g/dL (ref 6.0–8.3)

## 2014-06-14 LAB — BASIC METABOLIC PANEL
BUN: 13 mg/dL (ref 6–23)
CO2: 27 mEq/L (ref 19–32)
Calcium: 9.2 mg/dL (ref 8.4–10.5)
Chloride: 110 mEq/L (ref 96–112)
Creatinine, Ser: 0.7 mg/dL (ref 0.4–1.2)
GFR: 94.87 mL/min (ref >60.00)
Glucose, Bld: 101 mg/dL — ABNORMAL HIGH (ref 70–99)
Potassium: 4.8 mEq/L (ref 3.5–5.1)
Sodium: 141 mEq/L (ref 135–145)

## 2014-06-14 LAB — HEMOGLOBIN A1C: Hgb A1c MFr Bld: 5.7 % (ref 4.6–6.5)

## 2014-06-15 ENCOUNTER — Telehealth: Payer: Self-pay | Admitting: Internal Medicine

## 2014-06-15 ENCOUNTER — Ambulatory Visit (INDEPENDENT_AMBULATORY_CARE_PROVIDER_SITE_OTHER): Payer: BC Managed Care – PPO | Admitting: Internal Medicine

## 2014-06-15 ENCOUNTER — Encounter: Payer: Self-pay | Admitting: Internal Medicine

## 2014-06-15 VITALS — BP 100/70 | HR 90 | Temp 98.9°F | Ht 67.5 in | Wt 182.2 lb

## 2014-06-15 DIAGNOSIS — I341 Nonrheumatic mitral (valve) prolapse: Secondary | ICD-10-CM

## 2014-06-15 DIAGNOSIS — R739 Hyperglycemia, unspecified: Secondary | ICD-10-CM

## 2014-06-15 DIAGNOSIS — M79672 Pain in left foot: Secondary | ICD-10-CM

## 2014-06-15 DIAGNOSIS — M79671 Pain in right foot: Secondary | ICD-10-CM

## 2014-06-15 DIAGNOSIS — E7800 Pure hypercholesterolemia, unspecified: Secondary | ICD-10-CM

## 2014-06-15 DIAGNOSIS — K589 Irritable bowel syndrome without diarrhea: Secondary | ICD-10-CM

## 2014-06-15 DIAGNOSIS — Z86010 Personal history of colonic polyps: Secondary | ICD-10-CM

## 2014-06-15 DIAGNOSIS — M542 Cervicalgia: Secondary | ICD-10-CM

## 2014-06-15 DIAGNOSIS — K219 Gastro-esophageal reflux disease without esophagitis: Secondary | ICD-10-CM

## 2014-06-15 NOTE — Progress Notes (Signed)
Pre visit review using our clinic review tool, if applicable. No additional management support is needed unless otherwise documented below in the visit note. 

## 2014-06-15 NOTE — Telephone Encounter (Signed)
Removed from med list

## 2014-06-15 NOTE — Telephone Encounter (Signed)
Pt wanted to let Dr. Nicki Reaper know that she does not take Levsin.msn

## 2014-06-19 ENCOUNTER — Encounter: Payer: Self-pay | Admitting: Internal Medicine

## 2014-06-19 DIAGNOSIS — M79673 Pain in unspecified foot: Secondary | ICD-10-CM | POA: Insufficient documentation

## 2014-06-19 NOTE — Progress Notes (Signed)
Subjective:    Patient ID: Lynn Mann, female    DOB: 03/06/1951, 63 y.o.   MRN: [id number]  HPI 63 year old female with past history of MVP, rheumatic fever, and IBS who comes in today for a scheduled follow up.  She was evaluated 5/13 by Dr Ubaldo Glassing.  Had a negative stress test and ECHO reportedly ok.  Denies any chest pain or tightness with increased activity or exertion.  Was having back pain in 8/13.  Was found to have gallstones.  Was evaluated by Dr Gerilyn Nestle ([place] - surgery).  Did not recommend surgery.  Has had no reoccurrence.  Does have a history of IBS.  Had f/u colonoscopy 05/05/13.  States overall she feels she is doing well. Tries to stay active.  Eating and drinking well.  No nausea or vomiting.  No bowel change.  Cough not an issue for her now. Had f/u chest CT 12/09/13 - ok.  Cholesterol elevated.  Discussed the importance of diet and exercise.  Discussed cholesterol medication.  She prefers to work on diet and exercise and hold on medication.  She did agree to niacin.  She reports having persistent neck pain.  Request referral to [place].  Also has a left foot knot.  Request referral to podiatry.    Past Medical History  Diagnosis Date  . Arthritis   . Chicken pox   . Rheumatic fever   . MRSA exposure 2005    Spider bite  . Cholecystitis 11/2011    Did not require sgy - Dr. Staci Acosta - [facility]  (cholelithiasis)  . MVP (mitral valve prolapse)     Stable - Dr. Ubaldo Glassing  . Hypertension   . IBS (irritable bowel syndrome)   . H/O Clostridium difficile infection     Current Outpatient Prescriptions on File Prior to Visit  Medication Sig Dispense Refill  . albuterol (PROVENTIL HFA;VENTOLIN HFA) 108 (90 BASE) MCG/ACT inhaler Inhale 2 puffs into the lungs every 6 (six) hours as needed for wheezing. 1 Inhaler 2  . fluticasone (FLONASE) 50 MCG/ACT nasal spray Place 2 sprays into both nostrils daily.     No current facility-administered medications on file prior to visit.    Review of  Systems Patient denies any headache, lightheadedness or dizziness.  No sinus or allergy symptoms.  No chest pain, tightness or palpitations.  No increased shortness of breath, cough or congestion.  No acid reflux.  No dysphagia.  No nausea or vomiting.  No abdominal pain or cramping.  No bowel change, such as diarrhea, constipation, BRBPR or melana.   No urine change.   Increased cholesterol as outlined.   Willing to take niacin.  Neck pain as outlined.  Persistent foot nodule.       Objective:   Physical Exam  Filed Vitals:   06/15/14 0958  BP: 100/70  Pulse: 90  Temp: 98.9 F (77.57 C)   63 year old female in no acute distress.   HEENT:  Nares- clear.  Oropharynx - without lesions. NECK:  Supple.  Nontender.  No audible bruit.  HEART:  Appears to be regular. LUNGS:  No crackles or wheezing audible.  Respirations even and unlabored.  RADIAL PULSE:  Equal bilaterally.   ABDOMEN:  Soft, nontender.  Bowel sounds present and normal.  No audible abdominal bruit.   EXTREMITIES:  No increased edema present.  DP pulses palpable and equal bilaterally.          Assessment & Plan:  1. MVP (mitral valve  prolapse) Sees Dr Ubaldo Glassing.   2. IBS (irritable bowel syndrome) Colonoscopy as outlined.  Last 05/05/13.    3. Gastroesophageal reflux disease, esophagitis presence not specified Controlled.  Follow.   4. Hyperbilirubinemia Bilirubin wnl last check.    5. History of colonic polyps Colonoscopy 05/05/13 - one 9mm polyp in the sigmoid colon, diverticulosis and internal hemorrhoids.    6. Neck pain Persistent issues.  Refer to physical therapy.    7. Hypercholesterolemia Persistent elevated cholesterol.  Declines cholesterol medication.  Agreed to niacin.    8. Hyperglycemia Low carb diet.  Follow a1c.  Lab Results  Component Value Date   HGBA1C 5.7 06/14/2014   9. Right foot pain Persistent nodule left foot.  Refer to podiatry.    HEALTH MAINTENANCE.  Physical 02/03/14.  PAP 01/18/13  negative with negative HPV.  Mammogram 01/07/14 - Birads I.  Colonoscopy 05/05/13.      I spent 25 minutes with this patient with more than 50% of the time in consultation regarding the above.

## 2014-07-04 ENCOUNTER — Ambulatory Visit (INDEPENDENT_AMBULATORY_CARE_PROVIDER_SITE_OTHER): Payer: BC Managed Care – PPO | Admitting: Podiatry

## 2014-07-04 ENCOUNTER — Encounter: Payer: Self-pay | Admitting: Podiatry

## 2014-07-04 ENCOUNTER — Ambulatory Visit (INDEPENDENT_AMBULATORY_CARE_PROVIDER_SITE_OTHER): Payer: BC Managed Care – PPO

## 2014-07-04 VITALS — BP 128/80 | HR 65 | Resp 16 | Ht 68.0 in | Wt 180.0 lb

## 2014-07-04 DIAGNOSIS — M722 Plantar fascial fibromatosis: Secondary | ICD-10-CM

## 2014-07-04 DIAGNOSIS — M6740 Ganglion, unspecified site: Secondary | ICD-10-CM

## 2014-07-04 NOTE — Progress Notes (Signed)
   Subjective:    Patient ID: Lynn Mann, female    DOB: [birth year], 63 y.o.   MRN: [id number]  HPI Comments: i have a knot on the bottom of my left foot. Ive had it for a while maybe 3 - 4 months. Its getting worse. It hurts to walk. The pain is not constant. i dont do anything for my foot.  Foot Pain      Review of Systems  Skin:       Thick scars  All other systems reviewed and are negative.      Objective:   Physical Exam: I have reviewed her past medical history medications allergy surgery social history and review of systems. Pulses are strongly palpable bilateral. Neurologic sensation is intact for Semmes-Weinstein monofilament. Deep tendon reflexes intact bilateral muscle strength is 5 over 5 dorsiflexion plantar flexors and inverters and everters on physical musculatures intact. She has a palpable mass to the plantar medial longitudinal arch along the plantar fascia. Radiographic evaluation does not demonstrate any type of spiculation or calcification in this area. Cutaneous evaluation shows supple well-hydrated cutis.  Assessment: Plantar fibromatosis left foot.  Plan: Injected 1 mL Kenalog and local anesthetic into the lesion. Follow up with her in 6 weeks        Assessment & Plan:

## 2014-09-26 ENCOUNTER — Telehealth: Payer: Self-pay | Admitting: Internal Medicine

## 2014-09-26 ENCOUNTER — Telehealth: Payer: Self-pay

## 2014-09-26 NOTE — Telephone Encounter (Signed)
Spoke with pt, she states she does not have an appoint on 3.3.16.  Pt cancelled her lab appoint on 3.4.16 but is keeping her 3.9.16 appoint with Dr Nicki Reaper.

## 2014-09-26 NOTE — Telephone Encounter (Signed)
Reviewed note.  Apparently she was scheduled an appt with Dr Silvio Pate later this week.  Let her know that I am out of the office.  Is she ok to wait until appt scheduled.

## 2014-09-26 NOTE — Telephone Encounter (Signed)
The patient wants to discuss her referral to her gastro dr with the [facility].

## 2014-09-26 NOTE — Telephone Encounter (Signed)
Duplicate.  See attached note.

## 2014-09-26 NOTE — Telephone Encounter (Signed)
Patient Name: Lynn Mann  DOB: 04/15/1951    Initial Comment Caller states she had virus a while ago, still has it, has questions about what she can eat to feel better.   Nurse Assessment  Nurse: Leilani Merl, RN, Heather Date/Time (Eastern Time): 09/26/2014 11:51:25 AM  Confirm and document reason for call. If symptomatic, describe symptoms. ---Caller states that she had a stomach virus about a month ago with diarrhea and nausea, she is still having trouble with her stomach, she is having diarrhea off and on and is not able to eat normally yet.  Has the patient traveled out of the country within the last 30 days? ---Not Applicable  Does the patient require triage? ---Yes  Related visit to physician within the last 2 weeks? ---No  Does the PT have any chronic conditions? (i.e. diabetes, asthma, etc.) ---Yes  List chronic conditions. ---colitis, stomach problems     Guidelines    Guideline Title Affirmed Question Affirmed Notes  Diarrhea Diarrhea present > 7 days    Final Disposition User   See PCP When Office is Open (within 3 days) Standifer, RN, [facility] states that she has an appt with Dr. Silvio Pate on 09/28/13

## 2014-09-30 ENCOUNTER — Other Ambulatory Visit: Payer: BC Managed Care – PPO

## 2014-10-05 ENCOUNTER — Ambulatory Visit (INDEPENDENT_AMBULATORY_CARE_PROVIDER_SITE_OTHER): Payer: BLUE CROSS/BLUE SHIELD | Admitting: Internal Medicine

## 2014-10-05 ENCOUNTER — Encounter: Payer: Self-pay | Admitting: Internal Medicine

## 2014-10-05 VITALS — BP 98/60 | HR 82 | Temp 98.3°F | Ht 68.0 in | Wt 185.4 lb

## 2014-10-05 DIAGNOSIS — K219 Gastro-esophageal reflux disease without esophagitis: Secondary | ICD-10-CM

## 2014-10-05 DIAGNOSIS — E7800 Pure hypercholesterolemia, unspecified: Secondary | ICD-10-CM

## 2014-10-05 DIAGNOSIS — R739 Hyperglycemia, unspecified: Secondary | ICD-10-CM | POA: Diagnosis not present

## 2014-10-05 DIAGNOSIS — Z0000 Encounter for general adult medical examination without abnormal findings: Secondary | ICD-10-CM

## 2014-10-05 DIAGNOSIS — R197 Diarrhea, unspecified: Secondary | ICD-10-CM | POA: Diagnosis not present

## 2014-10-05 DIAGNOSIS — K8020 Calculus of gallbladder without cholecystitis without obstruction: Secondary | ICD-10-CM

## 2014-10-05 DIAGNOSIS — Z86010 Personal history of colonic polyps: Secondary | ICD-10-CM | POA: Diagnosis not present

## 2014-10-05 LAB — HEPATIC FUNCTION PANEL
ALT: 11 U/L (ref 0–35)
AST: 15 U/L (ref 0–37)
Albumin: 4.3 g/dL (ref 3.5–5.2)
Alkaline Phosphatase: 54 U/L (ref 39–117)
Bilirubin, Direct: 0.2 mg/dL (ref 0.0–0.3)
Total Bilirubin: 1 mg/dL (ref 0.2–1.2)
Total Protein: 6.9 g/dL (ref 6.0–8.3)

## 2014-10-05 LAB — CBC WITH DIFFERENTIAL/PLATELET
Basophils Absolute: 0 10*3/uL (ref 0.0–0.1)
Basophils Relative: 0.4 % (ref 0.0–3.0)
Eosinophils Absolute: 0 10*3/uL (ref 0.0–0.7)
Eosinophils Relative: 0.6 % (ref 0.0–5.0)
HCT: 41.4 % (ref 36.0–46.0)
Hemoglobin: 14.1 g/dL (ref 12.0–15.0)
Lymphocytes Relative: 31.2 % (ref 12.0–46.0)
Lymphs Abs: 1.7 10*3/uL (ref 0.7–4.0)
MCHC: 33.9 g/dL (ref 30.0–36.0)
MCV: 86.3 fl (ref 78.0–100.0)
Monocytes Absolute: 0.3 10*3/uL (ref 0.1–1.0)
Monocytes Relative: 5.3 % (ref 3.0–12.0)
Neutro Abs: 3.4 10*3/uL (ref 1.4–7.7)
Neutrophils Relative %: 62.5 % (ref 43.0–77.0)
Platelets: 275 10*3/uL (ref 150.0–400.0)
RBC: 4.8 Mil/uL (ref 3.87–5.11)
RDW: 13.2 % (ref 11.5–15.5)
WBC: 5.5 10*3/uL (ref 4.0–10.5)

## 2014-10-05 LAB — BASIC METABOLIC PANEL
BUN: 11 mg/dL (ref 6–23)
CO2: 30 mEq/L (ref 19–32)
Calcium: 9.7 mg/dL (ref 8.4–10.5)
Chloride: 106 mEq/L (ref 96–112)
Creatinine, Ser: 0.63 mg/dL (ref 0.40–1.20)
GFR: 101.75 mL/min (ref >60.00)
Glucose, Bld: 89 mg/dL (ref 70–99)
Potassium: 4.7 mEq/L (ref 3.5–5.1)
Sodium: 141 mEq/L (ref 135–145)

## 2014-10-05 MED ORDER — FLUTICASONE PROPIONATE 50 MCG/ACT NA SUSP: 2.0000 | Freq: Every day | NASAL | Status: DC

## 2014-10-05 MED ORDER — ALBUTEROL SULFATE HFA 108 (90 BASE) MCG/ACT IN AERS: 2.0000 | INHALATION_SPRAY | Freq: Four times a day (QID) | RESPIRATORY_TRACT | Status: DC | PRN

## 2014-10-05 NOTE — Progress Notes (Signed)
Patient ID: Lynn Mann, female   DOB: 1951/01/07, 64 y.o.   MRN: [id number]   Subjective:    Patient ID: Lynn Mann, female    DOB: 1950-12-24, 64 y.o.   MRN: [id number]  HPI  Patient here for a scheduled follow up.  States she is having flares of diarrhea.  Occurs every few days.  Beef aggravates.  Occurs after eating.  Does describe lower abdominal pain.  Varies.  No blood.  No vomiting.  Eating.  No cardiac symptoms with increased activity or exertion.  Breathing stable.     Past Medical History  Diagnosis Date  . Arthritis   . Chicken pox   . Rheumatic fever   . MRSA exposure 2005    Spider bite  . Cholecystitis 11/2011    Did not require sgy - Dr. Staci Acosta - [facility]  (cholelithiasis)  . MVP (mitral valve prolapse)     Stable - Dr. Ubaldo Glassing  . Hypertension   . IBS (irritable bowel syndrome)   . H/O Clostridium difficile infection     Current Outpatient Prescriptions on File Prior to Visit  Medication Sig Dispense Refill  . NIACIN, ANTIHYPERLIPIDEMIC, PO Take by mouth 3 (three) times daily.    . Omega-3 Fatty Acids (FISH OIL PO) Take by mouth 3 (three) times daily.     No current facility-administered medications on file prior to visit.    Review of Systems  Constitutional: Negative for appetite change and unexpected weight change.  HENT: Negative for congestion and sinus pressure.   Respiratory: Negative for cough, chest tightness and shortness of breath.   Cardiovascular: Negative for chest pain, palpitations and leg swelling.  Gastrointestinal: Positive for abdominal pain (varied with flares of diarrhea.  appears to be worse with beef.  no vomiting) and diarrhea. Negative for vomiting and blood in stool.  Genitourinary: Negative for dysuria and difficulty urinating.  Skin: Negative for color change and rash.  Neurological: Negative for dizziness, light-headedness and headaches.       Objective:    Physical Exam  Constitutional: She appears well-developed and  well-nourished. No distress.  HENT:  Nose: Nose normal.  Mouth/Throat: Oropharynx is clear and moist.  Neck: Neck supple. No thyromegaly present.  Cardiovascular: Normal rate and regular rhythm.   Pulmonary/Chest: Breath sounds normal. No respiratory distress. She has no wheezes.  Abdominal: Soft. Bowel sounds are normal. She exhibits no distension. There is tenderness (minimal tenderness to palpation. ).  Musculoskeletal: She exhibits no edema or tenderness.  Lymphadenopathy:    She has no cervical adenopathy.  Skin: No rash noted. No erythema.    BP 98/60 mmHg  Pulse 82  Temp(Src) 98.3 F (36.8 C) (Oral)  Ht _0  (1.727 m)  Wt 185 lb 6 oz (84.086 kg)  BMI 28.19 kg/m2  SpO2 94% Wt Readings from Last 3 Encounters:  10/05/14 185 lb 6 oz (84.086 kg)  07/04/14 180 lb (81.647 kg)  06/15/14 182 lb 4 oz (82.668 kg)     Lab Results  Component Value Date   WBC 5.5 10/05/2014   HGB 14.1 10/05/2014   HCT 41.4 10/05/2014   PLT 275.0 10/05/2014   GLUCOSE 89 10/05/2014   CHOL 214* 06/14/2014   TRIG 107.0 06/14/2014   HDL 42.30 06/14/2014   LDLDIRECT 124.3 01/18/2013   LDLCALC 150* 06/14/2014   ALT 11 10/05/2014   AST 15 10/05/2014   NA 141 10/05/2014   K 4.7 10/05/2014   CL 106 10/05/2014   CREATININE  0.63 10/05/2014   BUN 11 10/05/2014   CO2 30 10/05/2014   TSH 3.67 12/15/2013   HGBA1C 5.7 06/14/2014       Assessment & Plan:   Problem List Items Addressed This Visit    Cholelithiasis    Has a history of gallstones.  Saw Dr Gerilyn Nestle ([place] [facility]).  Decided not to do cholecystectomy.  Persistent intermittent symptoms.  Had CT.  Referred to GI previously.  Discussed referral back.  She wants to hold at this point.  Follow.  Check stool studies.        Diarrhea - Primary    Had recent colonoscopy.  Probiotic daily.  Check stool studies.  May need GI referral if persistent.  Had CT.  See report.  Was in 2014.  If persistent and stool studies unrevealing, may need f/u CT.          Relevant Orders   Stool Culture   Clostridium difficile EIA   Stool, WBC/Lactoferrin   Cdiff NAA+O+P+Stool Culture   Hepatic function panel (Completed)   CBC with Differential/Platelet (Completed)   Basic metabolic panel (Completed)   GERD (gastroesophageal reflux disease)    No acid reflux reported.  Off protonix.  Did not feel helped.  Follow.       Health care maintenance    Physical 02/03/14.  PAP 01/18/13 - negative with negative HHPV.  Colonoscopy 05/05/13.  Mammogram 01/07/14 - Birads I.        History of colonic polyps    Colonoscopy 05/05/13 - one 71m polyp in the sigmoid colon, diverticulosis and internal hemorrhoids.  Diarrhea as outlined.  Take probiotic daily.  Check stool studies as outlined.  Further w/up pending.        Hyperbilirubinemia    Follow liver panel.        Hypercholesterolemia    Low cholesterol diet and exercise.  Follow lipid panel.        Hyperglycemia    Low carb diet and exercise.  Follow met b and a1c.          I spent 25 minutes with the patient and more than 50% of the time was spent in consultation regarding the above.     SEinar Pheasant MD

## 2014-10-05 NOTE — Progress Notes (Signed)
Pre visit review using our clinic review tool, if applicable. No additional management support is needed unless otherwise documented below in the visit note. 

## 2014-10-06 ENCOUNTER — Encounter: Payer: Self-pay | Admitting: *Deleted

## 2014-10-09 ENCOUNTER — Encounter: Payer: Self-pay | Admitting: Internal Medicine

## 2014-10-09 DIAGNOSIS — Z0000 Encounter for general adult medical examination without abnormal findings: Secondary | ICD-10-CM | POA: Insufficient documentation

## 2014-10-09 NOTE — Assessment & Plan Note (Signed)
Low cholesterol diet and exercise.  Follow lipid panel.   

## 2014-10-09 NOTE — Assessment & Plan Note (Signed)
Follow liver panel.  

## 2014-10-09 NOTE — Assessment & Plan Note (Signed)
No acid reflux reported.  Off protonix.  Did not feel helped.  Follow.

## 2014-10-09 NOTE — Assessment & Plan Note (Addendum)
Had recent colonoscopy.  Probiotic daily.  Check stool studies.  May need GI referral if persistent.  Had CT.  See report.  Was in 2014.  If persistent and stool studies unrevealing, may need f/u CT.

## 2014-10-09 NOTE — Assessment & Plan Note (Signed)
Physical 02/03/14.  PAP 01/18/13 - negative with negative HHPV.  Colonoscopy 05/05/13.  Mammogram 01/07/14 - Birads I.

## 2014-10-09 NOTE — Assessment & Plan Note (Signed)
Low carb diet and exercise.  Follow met b and a1c.   

## 2014-10-09 NOTE — Assessment & Plan Note (Signed)
Colonoscopy 05/05/13 - one 51mm polyp in the sigmoid colon, diverticulosis and internal hemorrhoids.  Diarrhea as outlined.  Take probiotic daily.  Check stool studies as outlined.  Further w/up pending.

## 2014-10-09 NOTE — Assessment & Plan Note (Signed)
Has a history of gallstones.  Saw Dr Gerilyn Nestle ([place] surgery).  Decided not to do cholecystectomy.  Persistent intermittent symptoms.  Had CT.  Referred to GI previously.  Discussed referral back.  She wants to hold at this point.  Follow.  Check stool studies.

## 2014-10-11 ENCOUNTER — Other Ambulatory Visit: Payer: Self-pay | Admitting: Internal Medicine

## 2014-10-12 LAB — CDIFF NAA+O+P+STOOL CULTURE: Toxigenic C. Difficile by PCR: NEGATIVE

## 2014-10-12 LAB — C. DIFFICILE GDH AND TOXIN A/B
C. difficile GDH: NOT DETECTED
C. difficile Toxin A/B: NOT DETECTED

## 2014-10-12 LAB — FECAL LACTOFERRIN, QUANT: Lactoferrin: NEGATIVE

## 2014-10-14 LAB — OVA AND PARASITE EXAMINATION: OP: NONE SEEN

## 2014-10-15 LAB — STOOL CULTURE

## 2015-06-21 ENCOUNTER — Telehealth: Payer: Self-pay | Admitting: *Deleted

## 2015-07-25 ENCOUNTER — Encounter: Payer: Self-pay | Admitting: Nurse Practitioner

## 2015-07-25 ENCOUNTER — Ambulatory Visit (INDEPENDENT_AMBULATORY_CARE_PROVIDER_SITE_OTHER): Payer: BLUE CROSS/BLUE SHIELD | Admitting: Nurse Practitioner

## 2015-07-25 VITALS — BP 112/70 | HR 75 | Temp 98.3°F | Resp 12 | Ht 66.5 in | Wt 192.5 lb

## 2015-07-25 DIAGNOSIS — Z23 Encounter for immunization: Secondary | ICD-10-CM | POA: Diagnosis not present

## 2015-07-25 DIAGNOSIS — Z0000 Encounter for general adult medical examination without abnormal findings: Secondary | ICD-10-CM

## 2015-07-25 LAB — LIPID PANEL
Cholesterol: 197 mg/dL (ref 0–200)
HDL: 41.1 mg/dL (ref >39.00)
LDL Cholesterol: 124 mg/dL — ABNORMAL HIGH (ref 0–99)
NonHDL: 156.2
Total CHOL/HDL Ratio: 5
Triglycerides: 159 mg/dL — ABNORMAL HIGH (ref 0.0–149.0)
VLDL: 31.8 mg/dL (ref 0.0–40.0)

## 2015-07-25 LAB — COMPREHENSIVE METABOLIC PANEL
ALT: 15 U/L (ref 0–35)
AST: 19 U/L (ref 0–37)
Albumin: 4.4 g/dL (ref 3.5–5.2)
Alkaline Phosphatase: 58 U/L (ref 39–117)
BUN: 13 mg/dL (ref 6–23)
CO2: 28 mEq/L (ref 19–32)
Calcium: 9.6 mg/dL (ref 8.4–10.5)
Chloride: 105 mEq/L (ref 96–112)
Creatinine, Ser: 0.68 mg/dL (ref 0.40–1.20)
GFR: 92.92 mL/min (ref >60.00)
Glucose, Bld: 107 mg/dL — ABNORMAL HIGH (ref 70–99)
Potassium: 4.2 mEq/L (ref 3.5–5.1)
Sodium: 140 mEq/L (ref 135–145)
Total Bilirubin: 1 mg/dL (ref 0.2–1.2)
Total Protein: 7.2 g/dL (ref 6.0–8.3)

## 2015-07-25 LAB — CBC WITH DIFFERENTIAL/PLATELET
Basophils Absolute: 0 10*3/uL (ref 0.0–0.1)
Basophils Relative: 0.6 % (ref 0.0–3.0)
Eosinophils Absolute: 0.1 10*3/uL (ref 0.0–0.7)
Eosinophils Relative: 1 % (ref 0.0–5.0)
HCT: 42.5 % (ref 36.0–46.0)
Hemoglobin: 14.1 g/dL (ref 12.0–15.0)
Lymphocytes Relative: 30.8 % (ref 12.0–46.0)
Lymphs Abs: 1.7 10*3/uL (ref 0.7–4.0)
MCHC: 33.2 g/dL (ref 30.0–36.0)
MCV: 86.8 fl (ref 78.0–100.0)
Monocytes Absolute: 0.4 10*3/uL (ref 0.1–1.0)
Monocytes Relative: 6.9 % (ref 3.0–12.0)
Neutro Abs: 3.4 10*3/uL (ref 1.4–7.7)
Neutrophils Relative %: 60.7 % (ref 43.0–77.0)
Platelets: 300 10*3/uL (ref 150.0–400.0)
RBC: 4.9 Mil/uL (ref 3.87–5.11)
RDW: 13.7 % (ref 11.5–15.5)
WBC: 5.6 10*3/uL (ref 4.0–10.5)

## 2015-07-25 LAB — HEMOGLOBIN A1C: Hgb A1c MFr Bld: 5.8 % (ref 4.6–6.5)

## 2015-07-25 MED ORDER — ALBUTEROL SULFATE HFA 108 (90 BASE) MCG/ACT IN AERS: 2.0000 | INHALATION_SPRAY | Freq: Four times a day (QID) | RESPIRATORY_TRACT | Status: DC | PRN

## 2015-07-25 NOTE — Patient Instructions (Signed)
Please visit the lab before leaving. Happy New Year and wonderful to meet you!  Menopause is a normal process in which your reproductive ability comes to an end. This process happens gradually over a span of months to years, usually between the ages of 73 and 38. Menopause is complete when you have missed 12 consecutive menstrual periods. It is important to talk with your health care provider about some of the most common conditions that affect postmenopausal women, such as heart disease, cancer, and bone loss (osteoporosis). Adopting a healthy lifestyle and getting preventive care can help to promote your health and wellness. Those actions can also lower your chances of developing some of these common conditions. WHAT SHOULD I KNOW ABOUT MENOPAUSE? During menopause, you may experience a number of symptoms, such as:  Moderate-to-severe hot flashes.  Night sweats.  Decrease in sex drive.  Mood swings.  Headaches.  Tiredness.  Irritability.  Memory problems.  Insomnia. Choosing to treat or not to treat menopausal changes is an individual decision that you make with your health care provider. WHAT SHOULD I KNOW ABOUT HORMONE REPLACEMENT THERAPY AND SUPPLEMENTS? Hormone therapy products are effective for treating symptoms that are associated with menopause, such as hot flashes and night sweats. Hormone replacement carries certain risks, especially as you become older. If you are thinking about using estrogen or estrogen with progestin treatments, discuss the benefits and risks with your health care provider. WHAT SHOULD I KNOW ABOUT HEART DISEASE AND STROKE? Heart disease, heart attack, and stroke become more likely as you age. This may be due, in part, to the hormonal changes that your body experiences during menopause. These can affect how your body processes dietary fats, triglycerides, and cholesterol. Heart attack and stroke are both medical emergencies. There are many things that you  can do to help prevent heart disease and stroke:  Have your blood pressure checked at least every 1-2 years. High blood pressure causes heart disease and increases the risk of stroke.  If you are 11-52 years old, ask your health care provider if you should take aspirin to prevent a heart attack or a stroke.  Do not use any tobacco products, including cigarettes, chewing tobacco, or electronic cigarettes. If you need help quitting, ask your health care provider.  It is important to eat a healthy diet and maintain a healthy weight.  Be sure to include plenty of vegetables, fruits, low-fat dairy products, and lean protein.  Avoid eating foods that are high in solid fats, added sugars, or salt (sodium).  Get regular exercise. This is one of the most important things that you can do for your health.  Try to exercise for at least 150 minutes each week. The type of exercise that you do should increase your heart rate and make you sweat. This is known as moderate-intensity exercise.  Try to do strengthening exercises at least twice each week. Do these in addition to the moderate-intensity exercise.  Know your numbers.Ask your health care provider to check your cholesterol and your blood glucose. Continue to have your blood tested as directed by your health care provider. WHAT SHOULD I KNOW ABOUT CANCER SCREENING? There are several types of cancer. Take the following steps to reduce your risk and to catch any cancer development as early as possible. Breast Cancer  Practice breast self-awareness.  This means understanding how your breasts normally appear and feel.  It also means doing regular breast self-exams. Let your health care provider know about any changes, no  matter how small.  If you are 16 or older, have a clinician do a breast exam (clinical breast exam or CBE) every year. Depending on your age, family history, and medical history, it may be recommended that you also have a yearly  breast X-ray (mammogram).  If you have a family history of breast cancer, talk with your health care provider about genetic screening.  If you are at high risk for breast cancer, talk with your health care provider about having an MRI and a mammogram every year.  Breast cancer (BRCA) gene test is recommended for women who have family members with BRCA-related cancers. Results of the assessment will determine the need for genetic counseling and BRCA1 and for BRCA2 testing. BRCA-related cancers include these types:  Breast. This occurs in males or females.  Ovarian.  Tubal. This may also be called fallopian tube cancer.  Cancer of the abdominal or pelvic lining (peritoneal cancer).  Prostate.  Pancreatic. Cervical, Uterine, and Ovarian Cancer Your health care provider may recommend that you be screened regularly for cancer of the pelvic organs. These include your ovaries, uterus, and vagina. This screening involves a pelvic exam, which includes checking for microscopic changes to the surface of your cervix (Pap test).  For women ages 21-65, health care providers may recommend a pelvic exam and a Pap test every three years. For women ages 36-65, they may recommend the Pap test and pelvic exam, combined with testing for human papilloma virus (HPV), every five years. Some types of HPV increase your risk of cervical cancer. Testing for HPV may also be done on women of any age who have unclear Pap test results.  Other health care providers may not recommend any screening for nonpregnant women who are considered low risk for pelvic cancer and have no symptoms. Ask your health care provider if a screening pelvic exam is right for you.  If you have had past treatment for cervical cancer or a condition that could lead to cancer, you need Pap tests and screening for cancer for at least 20 years after your treatment. If Pap tests have been discontinued for you, your risk factors (such as having a new  sexual partner) need to be reassessed to determine if you should start having screenings again. Some women have medical problems that increase the chance of getting cervical cancer. In these cases, your health care provider may recommend that you have screening and Pap tests more often.  If you have a family history of uterine cancer or ovarian cancer, talk with your health care provider about genetic screening.  If you have vaginal bleeding after reaching menopause, tell your health care provider.  There are currently no reliable tests available to screen for ovarian cancer. Lung Cancer Lung cancer screening is recommended for adults 57-26 years old who are at high risk for lung cancer because of a history of smoking. A yearly low-dose CT scan of the lungs is recommended if you:  Currently smoke.  Have a history of at least 30 pack-years of smoking and you currently smoke or have quit within the past 15 years. A pack-year is smoking an average of one pack of cigarettes per day for one year. Yearly screening should:  Continue until it has been 15 years since you quit.  Stop if you develop a health problem that would prevent you from having lung cancer treatment. Colorectal Cancer  This type of cancer can be detected and can often be prevented.  Routine colorectal cancer  screening usually begins at age 26 and continues through age 4.  If you have risk factors for colon cancer, your health care provider may recommend that you be screened at an earlier age.  If you have a family history of colorectal cancer, talk with your health care provider about genetic screening.  Your health care provider may also recommend using home test kits to check for hidden blood in your stool.  A small camera at the end of a tube can be used to examine your colon directly (sigmoidoscopy or colonoscopy). This is done to check for the earliest forms of colorectal cancer.  Direct examination of the colon  should be repeated every 5-10 years until age 73. However, if early forms of precancerous polyps or small growths are found or if you have a family history or genetic risk for colorectal cancer, you may need to be screened more often. Skin Cancer  Check your skin from head to toe regularly.  Monitor any moles. Be sure to tell your health care provider:  About any new moles or changes in moles, especially if there is a change in a mole's shape or color.  If you have a mole that is larger than the size of a pencil eraser.  If any of your family members has a history of skin cancer, especially at a young age, talk with your health care provider about genetic screening.  Always use sunscreen. Apply sunscreen liberally and repeatedly throughout the day.  Whenever you are outside, protect yourself by wearing long sleeves, pants, a wide-brimmed hat, and sunglasses. WHAT SHOULD I KNOW ABOUT OSTEOPOROSIS? Osteoporosis is a condition in which bone destruction happens more quickly than new bone creation. After menopause, you may be at an increased risk for osteoporosis. To help prevent osteoporosis or the bone fractures that can happen because of osteoporosis, the following is recommended:  If you are 62-49 years old, get at least 1,000 mg of calcium and at least 600 mg of vitamin D per day.  If you are older than age 12 but younger than age 72, get at least 1,200 mg of calcium and at least 600 mg of vitamin D per day.  If you are older than age 44, get at least 1,200 mg of calcium and at least 800 mg of vitamin D per day. Smoking and excessive alcohol intake increase the risk of osteoporosis. Eat foods that are rich in calcium and vitamin D, and do weight-bearing exercises several times each week as directed by your health care provider. WHAT SHOULD I KNOW ABOUT HOW MENOPAUSE AFFECTS [place]? Depression may occur at any age, but it is more common as you become older. Common symptoms of  depression include:  Low or sad mood.  Changes in sleep patterns.  Changes in appetite or eating patterns.  Feeling an overall lack of motivation or enjoyment of activities that you previously enjoyed.  Frequent crying spells. Talk with your health care provider if you think that you are experiencing depression. WHAT SHOULD I KNOW ABOUT IMMUNIZATIONS? It is important that you get and maintain your immunizations. These include:  Tetanus, diphtheria, and pertussis (Tdap) booster vaccine.  Influenza every year before the flu season begins.  Pneumonia vaccine.  Shingles vaccine. Your health care provider may also recommend other immunizations.   This information is not intended to replace advice given to you by your health care provider. Make sure you discuss any questions you have with your health care provider.  Document Released: 09/06/2005 Document Revised: 08/05/2014 Document Reviewed: 03/17/2014 Elsevier Interactive Patient Education [employer].

## 2015-07-25 NOTE — Progress Notes (Addendum)
Patient ID: Lynn Mann, female    DOB: June 10, 1951  Age: 64 y.o. MRN: [id number]  CC: Annual Exam   HPI [name] presents for her Annual Physical exam.  1) Annual Physical   Diet- No Formal   Exercise- No Formal   Immunizations-    Flu- Declined    Tdap- Today  Mammogram- 11/25/2013  Colonoscopy- 05/25/2013   Eye Exam- UTD  Dental Exam- UTD  LMP- Post-menopausal   Labs- Fasting today   Fall- Neg.   Depression- Neg.  Refills: Albuterol inhaler   History Lynn Mann has a past medical history of Arthritis; Chicken pox; Rheumatic fever; MRSA exposure (2005); Cholecystitis (11/2011); MVP (mitral valve prolapse); Hypertension; IBS (irritable bowel syndrome); and H/O Clostridium difficile infection.   She has past surgical history that includes Muscle biopsy.   Her family history includes Arthritis in her father and mother; Breast cancer in her maternal aunt; Cancer in her paternal uncle; Diabetes in her mother and paternal grandmother; Hypertension in her mother; Stroke in her father and mother.She reports that she has never smoked. She has never used smokeless tobacco. She reports that she drinks alcohol. She reports that she does not use illicit drugs.  Outpatient Prescriptions Prior to Visit  Medication Sig Dispense Refill  . fluticasone (FLONASE) 50 MCG/ACT nasal spray Place 2 sprays into both nostrils daily. 16 g 5  . NIACIN, ANTIHYPERLIPIDEMIC, PO Take by mouth 3 (three) times daily.    . Omega-3 Fatty Acids (FISH OIL PO) Take by mouth 3 (three) times daily.    Marland Kitchen albuterol (PROVENTIL HFA;VENTOLIN HFA) 108 (90 BASE) MCG/ACT inhaler Inhale 2 puffs into the lungs every 6 (six) hours as needed for wheezing. 1 Inhaler 2   No facility-administered medications prior to visit.    ROS Review of Systems  Constitutional: Negative for fever, chills, diaphoresis, fatigue and unexpected weight change.  HENT: Negative for tinnitus and trouble swallowing.   Eyes: Negative for visual  disturbance.  Respiratory: Negative for cough, chest tightness, shortness of breath and wheezing.   Cardiovascular: Negative for chest pain, palpitations and leg swelling.  Gastrointestinal: Negative for nausea, vomiting, abdominal pain, diarrhea, constipation and blood in stool.  Endocrine: Negative for polydipsia, polyphagia and polyuria.  Genitourinary: Negative for dysuria, hematuria, vaginal discharge and vaginal pain.  Musculoskeletal: Negative for myalgias, back pain, arthralgias and gait problem.  Skin: Negative for color change and rash.  Neurological: Negative for dizziness, weakness, numbness and headaches.  Hematological: Does not bruise/bleed easily.  Psychiatric/Behavioral: Negative for suicidal ideas and sleep disturbance. The patient is not nervous/anxious.     Objective:  BP 112/70 mmHg  Pulse 75  Temp(Src) 98.3 F (36.8 C) (Oral)  Resp 12  Ht 5' 6.5" (1.689 m)  Wt 192 lb 8 oz (87.317 kg)  BMI 30.61 kg/m2  SpO2 95%  Physical Exam  Constitutional: She is oriented to person, place, and time. She appears well-developed and well-nourished. No distress.  HENT:  Head: Normocephalic and atraumatic.  Right Ear: External ear normal.  Left Ear: External ear normal.  Eyes: EOM are normal. Pupils are equal, round, and reactive to light. Right eye exhibits no discharge. Left eye exhibits no discharge. No scleral icterus.  Neck: Normal range of motion. Neck supple.  Cardiovascular: Normal rate, regular rhythm, normal heart sounds and intact distal pulses.  Exam reveals no gallop and no friction rub.   No murmur heard. Pulmonary/Chest: Effort normal and breath sounds normal. No respiratory distress. She has no wheezes. She  has no rales. She exhibits no tenderness.  Clinical breast exam performed without significant findings  Abdominal: Soft. Bowel sounds are normal.  Genitourinary:  Deferred- no complaints  Musculoskeletal: Normal range of motion. She exhibits no edema or  tenderness.  Lymphadenopathy:    She has no cervical adenopathy.  Neurological: She is alert and oriented to person, place, and time. No cranial nerve deficit. She exhibits normal muscle tone. Coordination normal.  Skin: Skin is warm and dry. No rash noted. She is not diaphoretic.  Psychiatric: She has a normal mood and affect. Her behavior is normal. Judgment and thought content normal.   Assessment & Plan:   [name] was seen today for annual exam.  Diagnoses and all orders for this visit:  Routine general medical examination at a health care facility -     Comp Met (CMET) -     HgB A1c -     Lipid Profile -     CBC with Differential/Platelet  Other orders -     albuterol (PROVENTIL HFA;VENTOLIN HFA) 108 (90 BASE) MCG/ACT inhaler; Inhale 2 puffs into the lungs every 6 (six) hours as needed for wheezing.  I am having Ms. Sedlak maintain her (NIACIN, ANTIHYPERLIPIDEMIC, PO), Omega-3 Fatty Acids (FISH OIL PO), fluticasone, and albuterol.  Meds ordered this encounter  Medications  . albuterol (PROVENTIL HFA;VENTOLIN HFA) 108 (90 BASE) MCG/ACT inhaler    Sig: Inhale 2 puffs into the lungs every 6 (six) hours as needed for wheezing.    Dispense:  1 Inhaler    Refill:  2    Order Specific Question:  Supervising Provider    Answer:  Crecencio Mc [[id number]     Follow-up: Return in about 1 year (around 07/24/2016) for CPE w/ fasting labs.

## 2015-07-25 NOTE — Progress Notes (Signed)
Pre-visit discussion using our clinic review tool. No additional management support is needed unless otherwise documented below in the visit note.  

## 2015-07-25 NOTE — Assessment & Plan Note (Signed)
Discussed acute and chronic issues. Reviewed health maintenance measures, PFSHx, and immunizations. Obtain routine labs Lipid panel, CBC w/ diff, A1c, and CMET.   Tdap given today

## 2015-07-27 NOTE — Addendum Note (Signed)
Addended by: Rubbie Battiest on: 07/27/2015 07:43 AM   Modules accepted: Miquel Dunn

## 2015-08-04 ENCOUNTER — Telehealth: Payer: Self-pay | Admitting: Internal Medicine

## 2015-08-04 NOTE — Telephone Encounter (Signed)
Left a detailed message for patient to return my call to confirm the pharmacy. Thanks

## 2015-08-04 NOTE — Telephone Encounter (Signed)
If she is exposed to her daughter who has tested positive, I am ok with prophylaxis for the flu.  Can call in tamiflu 75mg  q day x 10 days.  Let me know if any problems.

## 2015-08-04 NOTE — Telephone Encounter (Signed)
Spoke with patient to clarify.  Patient's daughter has been sick, diagnosis with Flu two days ago.  Positive flu test.  Put on Tamiflu.  Patient has been assisting watching her grandbaby and now is concerned that ahe may get sick.  Has no symptoms at all, wants to know if she could get tamiflu for prevention? She is not a medication person, but wants to know if it is a good idea or if needed.  I explained that normally we don't give medications with non symptoms, but I would ask.  Please advise?

## 2015-08-04 NOTE — Telephone Encounter (Signed)
Pt called wanting to know do we give Namaflu for prevention? And also does it have a lot side effects? Call [phone] Thank You!

## 2015-08-05 NOTE — Telephone Encounter (Signed)
[place] on call physician  Patient never called in with pharmacy. She remains asymptomatic. Now 72-96 hours out from contact. Apparently mother has been afebrile for several days now. Per up to date "The ACIP states that individuals in whom postexposure prophylaxis can be considered are those who have had close contact with a confirmed or suspected case of influenza during that person's infectious period (one day before the onset of symptoms until 24 hours after the fever ends) and who are at high risk for complications of influenza (eg, individuals with certain chronic medical conditions, <58 years of age, ?63 years of age, pregnant women) .Marland KitchenMarland KitchenPostexposure prophylaxis should be used only when antivirals can be started within 48 hours of the most recent exposure" .   Since she is now out of time window, we opted not to treat. If becomes symptomatic should call back.

## 2015-09-27 ENCOUNTER — Ambulatory Visit
Admission: EM | Admit: 2015-09-27 | Discharge: 2015-09-27 | Disposition: A | Discharge status: Home / Self Care | Payer: BLUE CROSS/BLUE SHIELD | Attending: Family Medicine | Admitting: Family Medicine

## 2015-09-27 ENCOUNTER — Encounter: Payer: Self-pay | Admitting: Emergency Medicine

## 2015-09-27 ENCOUNTER — Ambulatory Visit (INDEPENDENT_AMBULATORY_CARE_PROVIDER_SITE_OTHER): Payer: BLUE CROSS/BLUE SHIELD

## 2015-09-27 DIAGNOSIS — J40 Bronchitis, not specified as acute or chronic: Secondary | ICD-10-CM | POA: Diagnosis not present

## 2015-09-27 LAB — RAPID INFLUENZA A&B ANTIGENS
Influenza A (ARMC): NEGATIVE
Influenza B (ARMC): NEGATIVE

## 2015-09-27 LAB — RAPID STREP SCREEN (MED CTR MEBANE ONLY): Streptococcus, Group A Screen (Direct): NEGATIVE

## 2015-09-27 IMAGING — CR DG CHEST 2V
2 series · 2 of 2 positions shown · non-contrast
Comparison: CT dated [DATE]

CLINICAL DATA: 65-year-old female with cough

EXAM:
CHEST  2 VIEW

[chest pa]
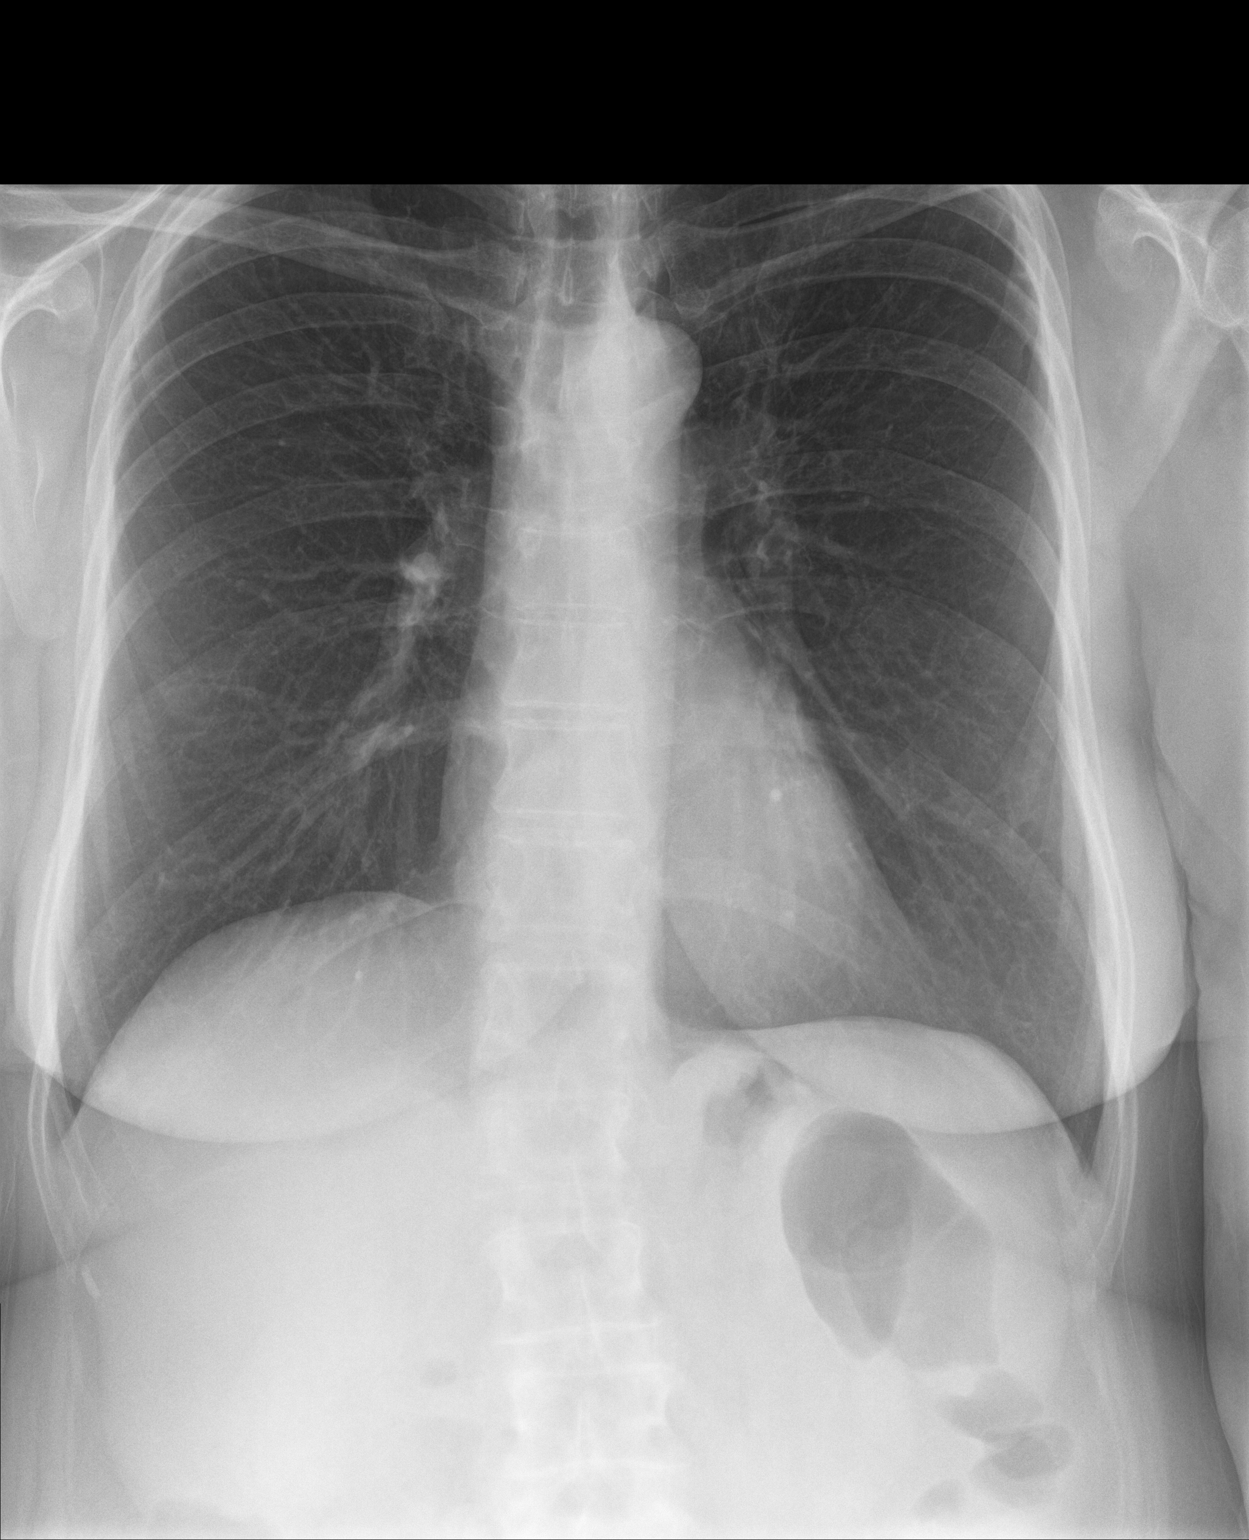

[chest lat]
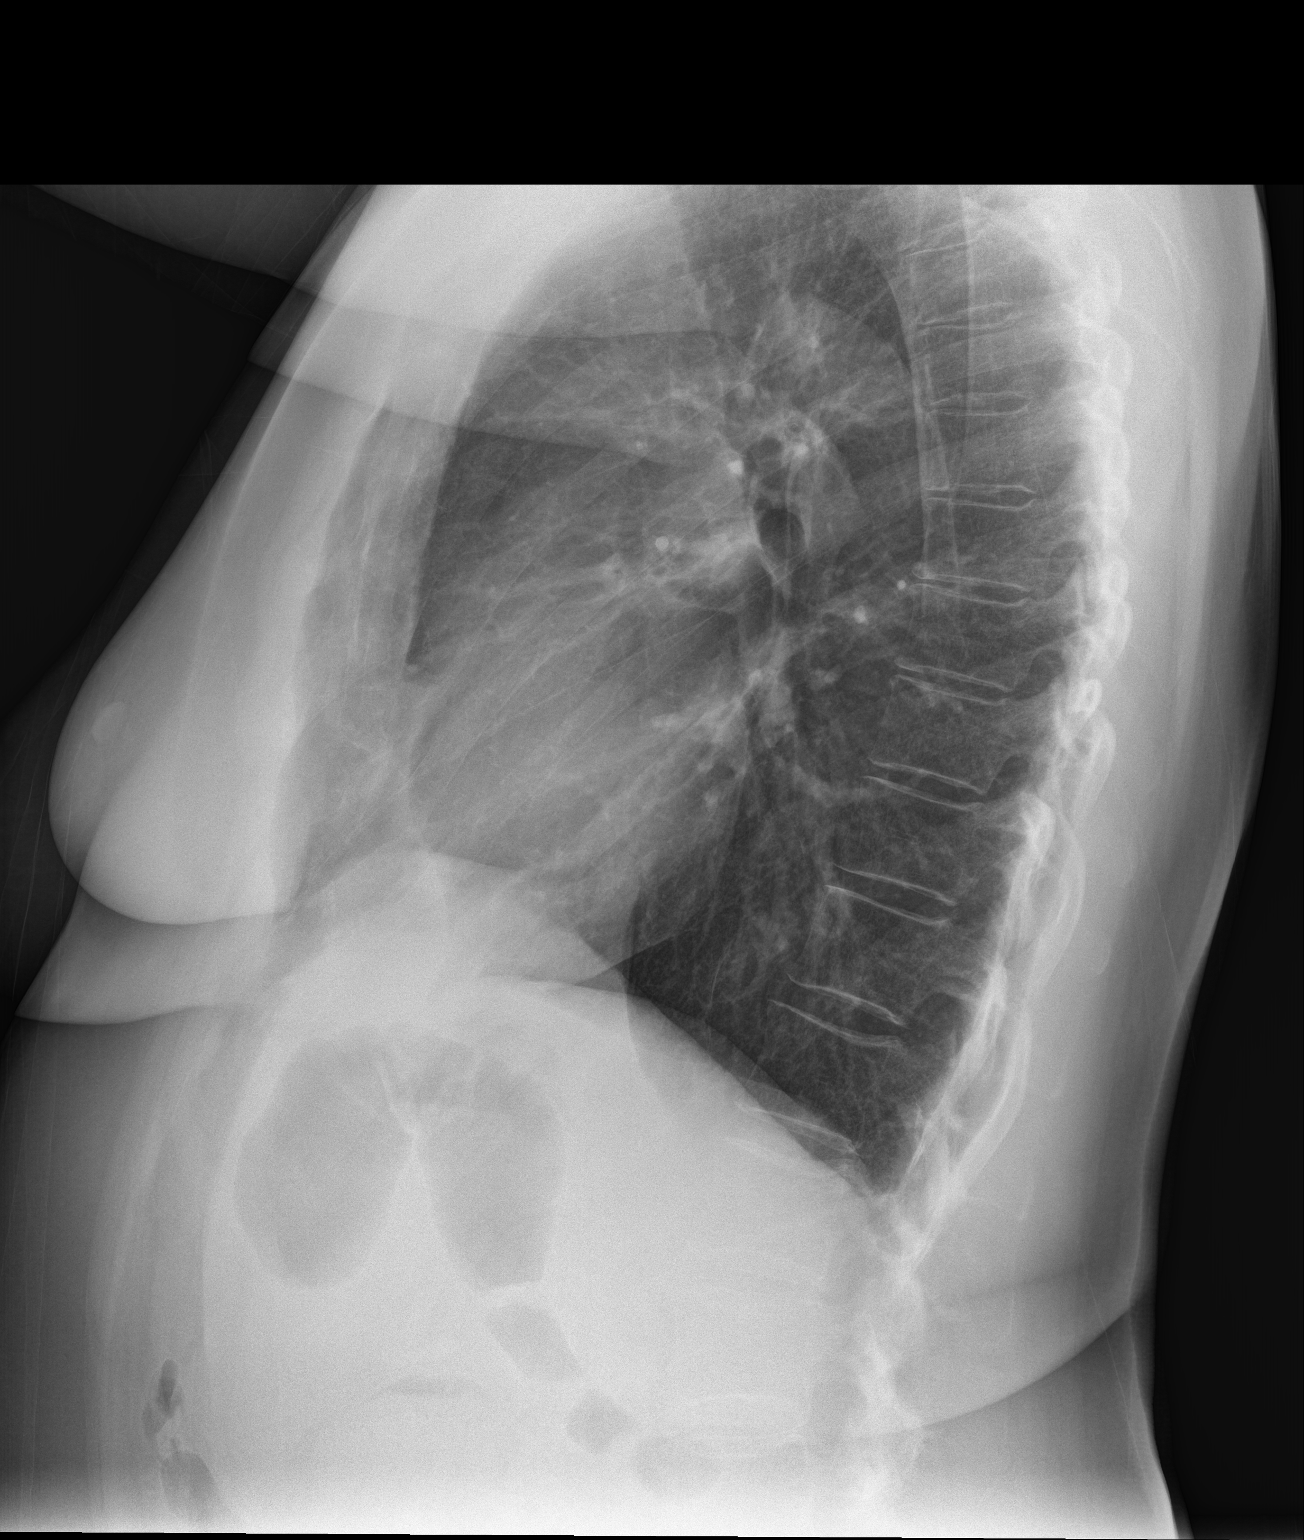

[2 of 2 positions shown; findings below may reference images not displayed]

FINDINGS: Two views of the chest do not demonstrate a focal consolidation.
There is no pleural effusion or pneumothorax. The cardiac silhouette
is within normal limits. No acute osseous pathology.
IMPRESSION: No active cardiopulmonary disease.

## 2015-09-27 MED ORDER — BENZONATATE 100 MG PO CAPS: 100.0000 mg | ORAL_CAPSULE | Freq: Three times a day (TID) | ORAL | Status: DC | PRN

## 2015-09-27 MED ORDER — AZITHROMYCIN 250 MG PO TABS: ORAL_TABLET | ORAL | Status: DC

## 2015-09-27 MED ORDER — HYDROCOD POLST-CPM POLST ER 10-8 MG/5ML PO SUER
5.0000 mL | Freq: Every evening | ORAL | Status: DC | PRN
Start: 2015-09-27 — End: 2016-01-23

## 2015-09-27 NOTE — Discharge Instructions (Signed)
Take medication as prescribed. Rest. Drink plenty of fluids.   Follow up with your primary care physician this week as discussed.   Return to Urgent care or ER for chest pain, shortness of breath, weakness, dizziness, new or worsening concerns.

## 2015-09-27 NOTE — ED Notes (Signed)
Patient c/o cough and chest congestion for the past 2 days.  Patient denies fevers.  

## 2015-09-27 NOTE — ED Provider Notes (Signed)
[facility]  ____________________________________________  Time seen: Approximately 8:07 PM  I have reviewed the triage vital signs and the nursing notes.   HISTORY  Chief Complaint Cough  HPI Lynn Mann is a 65 y.o. female presents for the complaints of 3-4 days of cough and chest congestion.  Reports also with some occasional runny nose and ear discomfort. States cough is biggest complaint. States cough is mostly dry but occasional productive of whitish mucous. States in the last 2 days cough has increased. Denies known fevers. Reports continues to eat and drink well. Denies known sick contacts, except for husband who recently has had similar. Denies known wheezing. Reports some history of some seasonal allergies. States concerned as last time she had a cough like this she ended up having pneumonia.   Denies chest pain, shortness of breath, chest pain with deep breath, palpitations, syncope or near syncope. Denies neck or back pain, rash, extremity pain or extremity swelling. Denies recent immobilizations or surgery.   PCP: Nicki Reaper   Past Medical History  Diagnosis Date  . Arthritis   . Chicken pox   . Rheumatic fever   . MRSA exposure 2005    Spider bite  . Cholecystitis 11/2011    Did not require sgy - Dr. Staci Acosta - [facility]  (cholelithiasis)  . MVP (mitral valve prolapse)     Stable - Dr. Ubaldo Glassing  . Hypertension   . IBS (irritable bowel syndrome)   . H/O Clostridium difficile infection     Patient Active Problem List   Diagnosis Date Noted  . Routine general medical examination at a health care facility 07/25/2015  . Health care maintenance 10/09/2014  . Foot pain 06/19/2014  . Diarrhea 04/08/2014  . Hyperglycemia 02/07/2014  . Neck pain 11/26/2013  . Hypercholesterolemia 11/26/2013  . History of colonic polyps 05/11/2013  . Hyperbilirubinemia 01/18/2013  . GERD (gastroesophageal reflux disease) 12/03/2012  . Pneumonia 11/27/2012  . Cholelithiasis  11/07/2012  . IBS (irritable bowel syndrome) 11/07/2012  . History of rheumatic fever 08/28/2012  . MVP (mitral valve prolapse) 08/28/2012    Past Surgical History  Procedure Laterality Date  . Muscle biopsy      Current Outpatient Rx  Name  Route  Sig  Dispense  Refill  . albuterol (PROVENTIL HFA;VENTOLIN HFA) 108 (90 BASE) MCG/ACT inhaler   Inhalation   Inhale 2 puffs into the lungs every 6 (six) hours as needed for wheezing.   1 Inhaler   2   . fluticasone (FLONASE) 50 MCG/ACT nasal spray   Each Nare   Place 2 sprays into both nostrils daily.   16 g   5   . NIACIN, ANTIHYPERLIPIDEMIC, PO   Oral   Take by mouth 3 (three) times daily.         . Omega-3 Fatty Acids (FISH OIL PO)   Oral   Take by mouth 3 (three) times daily.           Allergies Adhesive and Yellow dyes (non-tartrazine)  Family History  Problem Relation Age of Onset  . Arthritis Mother   . Stroke Mother   . Diabetes Mother   . Hypertension Mother   . Arthritis Father   . Stroke Father   . Diabetes Paternal Grandmother   . Cancer Paternal Uncle     colon  . Breast cancer Maternal Aunt   . Heart disease      maternal and paternal side    Social History Social History  Substance  Use Topics  . Smoking status: Never Smoker   . Smokeless tobacco: Never Used  . Alcohol Use: 0.0 oz/week    0 Standard drinks or equivalent per week     Comment: Rarely    Review of Systems Constitutional: No fever/chills Eyes: No visual changes. ENT: No sore throat. Cardiovascular: Denies chest pain. Respiratory: Denies shortness of breath. Positive cough. Gastrointestinal: No abdominal pain.  No nausea, no vomiting.  No diarrhea.  No constipation. Genitourinary: Negative for dysuria. Musculoskeletal: Negative for back pain. Skin: Negative for rash. Neurological: Negative for headaches, focal weakness or numbness.  10-point ROS otherwise  negative.  ____________________________________________   PHYSICAL EXAM:  VITAL SIGNS: ED Triage Vitals  Enc Vitals Group     BP 09/27/15 1838 134/65 mmHg     Pulse Rate 09/27/15 1838 86     Resp 09/27/15 1838 16     Temp 09/27/15 1838 97.6 F (36.4 C)     Temp Source 09/27/15 1838 Tympanic     SpO2 09/27/15 1838 98 %     Weight 09/27/15 1838 183 lb (83.008 kg)     Height 09/27/15 1838 5\' 7"  (1.702 m)     Head Cir --      Peak Flow --      Pain Score 09/27/15 1841 0     Pain Loc --      Pain Edu? --      Excl. in [place]? --   Constitutional: Alert and oriented. Well appearing and in no acute distress. Eyes: Conjunctivae are normal. PERRL. EOMI. Head: Atraumatic. No sinus tenderness to palpation. No swelling. No erythema.  Ears: no erythema, normal TMs bilaterally.   Nose:Nasal congestion with clear rhinorrhea  Mouth/Throat: Mucous membranes are moist. Mild pharyngeal erythema. No tonsillar swelling or exudate.  Neck: No stridor.  No cervical spine tenderness to palpation. Hematological/Lymphatic/Immunilogical: No cervical lymphadenopathy. Cardiovascular: Normal rate, regular rhythm. Grossly normal heart sounds.  Good peripheral circulation. Respiratory: Normal respiratory effort.  No retractions. Mild scattered rhonchi. No wheezes or rales. Good air movement. Speaks in complete sentences. Ambulatory in room while speaking in complete sentences. Dry intermittent cough noted.  Gastrointestinal: Soft and nontender. Normal Bowel sounds. No CVA tenderness. Musculoskeletal: No lower or upper extremity tenderness nor edema. No cervical, thoracic or lumbar tenderness to palpation. No calf tenderness bilaterally. Bilateral pedal pulses equal and easily palpable. Neurologic:  Normal speech and language. No gross focal neurologic deficits are appreciated. No gait instability. Skin:  Skin is warm, dry and intact. No rash noted. Psychiatric: Mood and affect are normal. Speech and behavior are  normal.  ____________________________________________   LABS (all labs ordered are listed, but only abnormal results are displayed)  Labs Reviewed  RAPID STREP SCREEN (NOT AT [facility])  RAPID INFLUENZA A&B ANTIGENS ([facility] ONLY)  CULTURE, GROUP A STREP [facility])   RADIOLOGY  EXAM: CHEST 2 VIEW  COMPARISON: CT dated 11/29/2013  FINDINGS: Two views of the chest do not demonstrate a focal consolidation. There is no pleural effusion or pneumothorax. The cardiac silhouette is within normal limits. No acute osseous pathology.  IMPRESSION: No active cardiopulmonary disease.   Electronically Signed By: Anner Crete M.D. On: 09/27/2015 20:20  I, Marylene Land, personally viewed and evaluated these images (plain radiographs) as part of my medical decision making, as well as reviewing the written report by the radiologist.  ____________________________________________   INITIAL IMPRESSION / [place] / ED COURSE  Pertinent labs & imaging results that were available during my care  of the patient were reviewed by me and considered in my medical decision making (see chart for details).  Very well-appearing patient. No acute distress. Presents for the complaints of cough and chest congestion 3-4 days as increase over last 2 days. Also some runny nose, sore throat and ear discomfort. Reports cough has recently kept her up at night. Denies fevers. Denies chest pain or shortness of breath. Lungs with mild scattered rhonchi no wheezes or rales. Abdomen soft and nontender. Moist mucous membranes. Room air oxygenation saturation 98%. Vital signs stable. Suspect bronchitis.  Chest xray no active cardiopulmonary disease per radiology. Strep negative, will culture. Influenza negative. Discussed in detail with patient, suspect viral upper respiratory infection and bronchitis. Discussed supportive treatment including prn tussionex at night and tessalon perles during day. Discussed with  patient and encouraged PCP follow up. Reports has taken tussionex in past and toelrated well. Patient states concerned for need for antibiotic, rx for z-pack given and post dated 3 days and directed if continues with cough and congestion in 3 more days to initiate treatment with antibiotic. Discussed with patient to follow up immediately for change in symptoms, fevers, increased cough, chest pain, shortness of breath, dizziness, weakness, or other concerns. Discussed indication, risks and benefits of medications with patient.   Discussed follow up with Primary care physician this week. Discussed follow up and return parameters including no resolution or any worsening concerns. Patient verbalized understanding and agreed to plan.   ____________________________________________    FINAL CLINICAL IMPRESSION(S) / ED DIAGNOSES  Final diagnoses:  Bronchitis      Note: This dictation was prepared with Dragon dictation along with smaller phrase technology. Any transcriptional errors that result from this process are unintentional.    Marylene Land, NP 09/28/15 1016

## 2015-09-28 ENCOUNTER — Telehealth: Payer: Self-pay | Admitting: Internal Medicine

## 2015-09-28 NOTE — Telephone Encounter (Signed)
[id number]

## 2015-09-28 NOTE — Telephone Encounter (Signed)
Spoke with pt she wanted to cancel appt due to being seen in urgent care

## 2015-09-28 NOTE — Telephone Encounter (Signed)
Patient returned call, and will be at [phone] for the remainder of the day.

## 2015-09-28 NOTE — Telephone Encounter (Signed)
Pt called to cancel her appt on 09/29/2015. Pt was also told to be seen with her pcp in 3 days. Pt went to Urgent care on 09/27/15 and was diagnosed with acute bronchitis and was given a Zpac with antibiotic and was not advised when to take it. Pt was alos given medication to stop coughing. Doctor at Urgent care stated to take if she did not feel better. Call pt @ [phone]. Thank you!

## 2015-09-29 ENCOUNTER — Ambulatory Visit: Payer: BLUE CROSS/BLUE SHIELD | Admitting: Nurse Practitioner

## 2015-09-29 LAB — CULTURE, GROUP A STREP (THRC)

## 2015-10-02 ENCOUNTER — Ambulatory Visit: Payer: BLUE CROSS/BLUE SHIELD | Admitting: Nurse Practitioner

## 2015-12-16 ENCOUNTER — Ambulatory Visit
Admission: EM | Admit: 2015-12-16 | Discharge: 2015-12-16 | Disposition: A | Discharge status: Home / Self Care | Payer: BLUE CROSS/BLUE SHIELD | Attending: Family Medicine | Admitting: Family Medicine

## 2015-12-16 ENCOUNTER — Ambulatory Visit (INDEPENDENT_AMBULATORY_CARE_PROVIDER_SITE_OTHER): Payer: BLUE CROSS/BLUE SHIELD

## 2015-12-16 DIAGNOSIS — S01512A Laceration without foreign body of oral cavity, initial encounter: Secondary | ICD-10-CM

## 2015-12-16 DIAGNOSIS — J206 Acute bronchitis due to rhinovirus: Secondary | ICD-10-CM

## 2015-12-16 DIAGNOSIS — H6593 Unspecified nonsuppurative otitis media, bilateral: Secondary | ICD-10-CM

## 2015-12-16 DIAGNOSIS — J019 Acute sinusitis, unspecified: Secondary | ICD-10-CM

## 2015-12-16 LAB — RAPID STREP SCREEN (MED CTR MEBANE ONLY): Streptococcus, Group A Screen (Direct): NEGATIVE

## 2015-12-16 IMAGING — CR DG CHEST 2V
2 series · 2 of 2 positions shown · non-contrast
Comparison: [DATE]

CLINICAL DATA: PT with SOB three days with activity. No hx of COPD.
Pt was exposed to NILA oil burning off in her oven for about 30
minutes yesterday.

EXAM:
CHEST  2 VIEW

[chest pa]
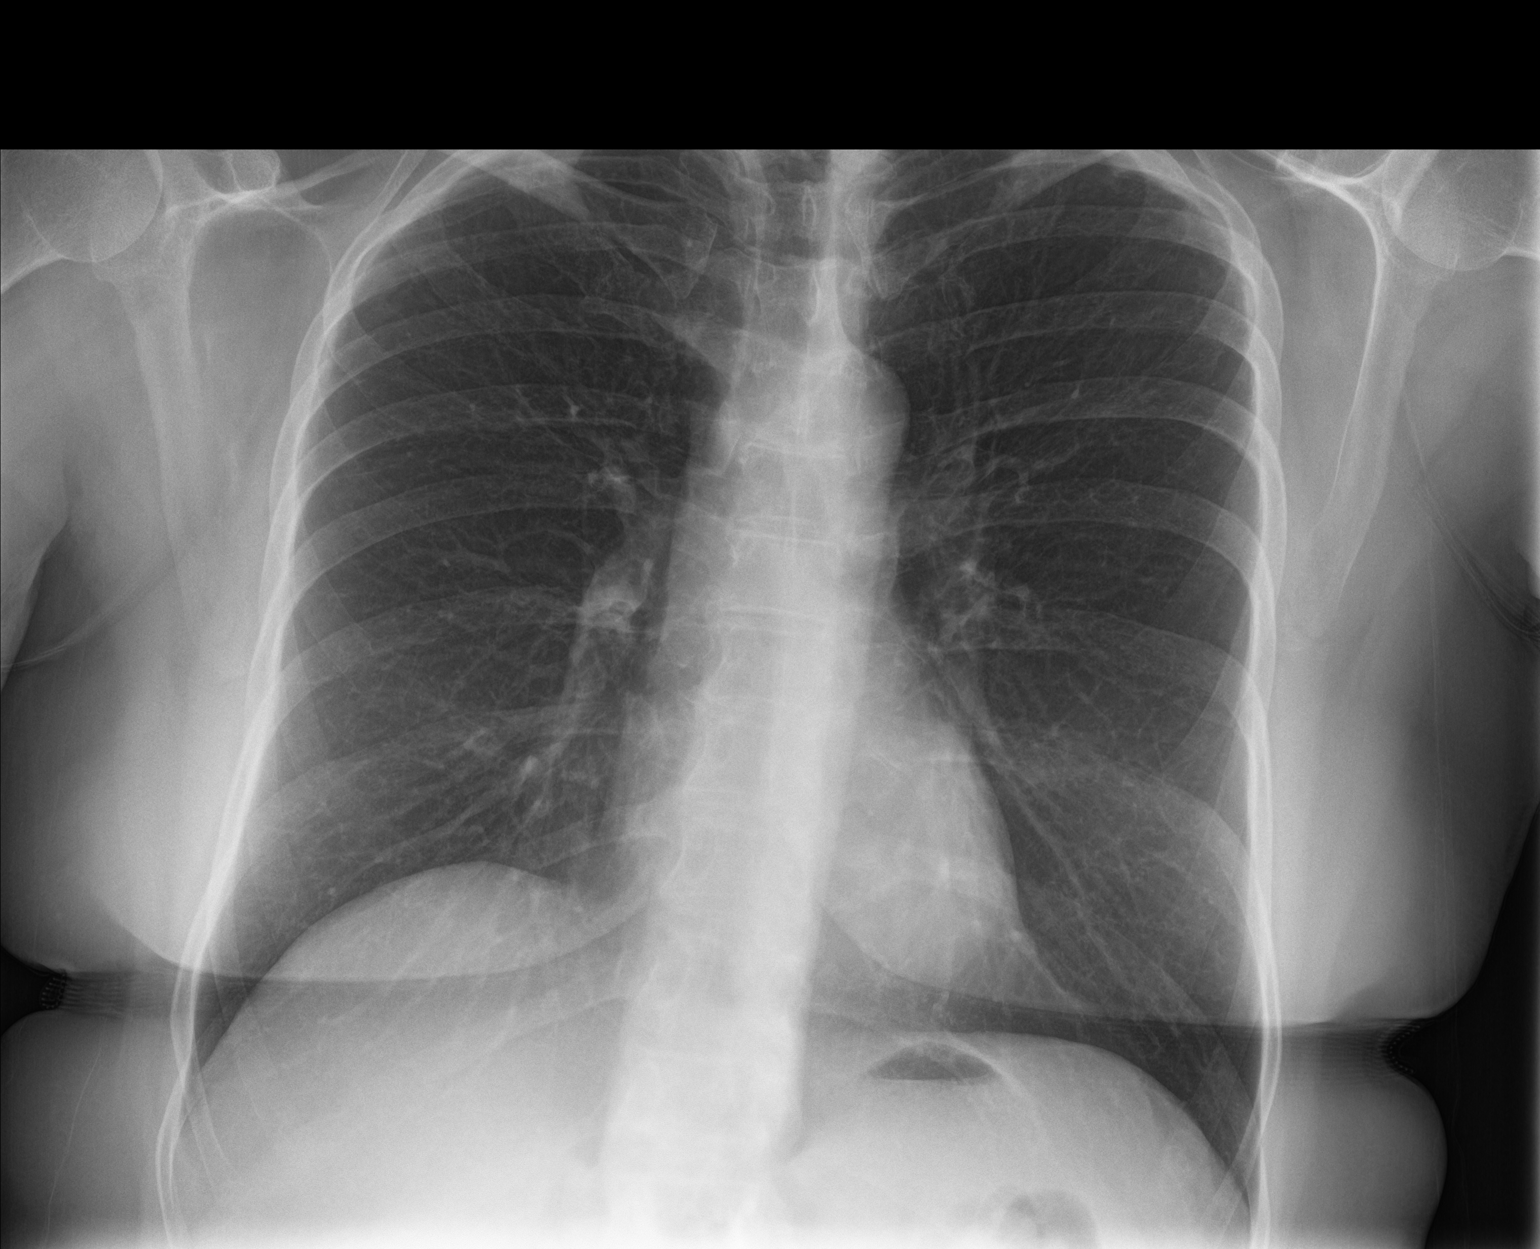

[chest lat]
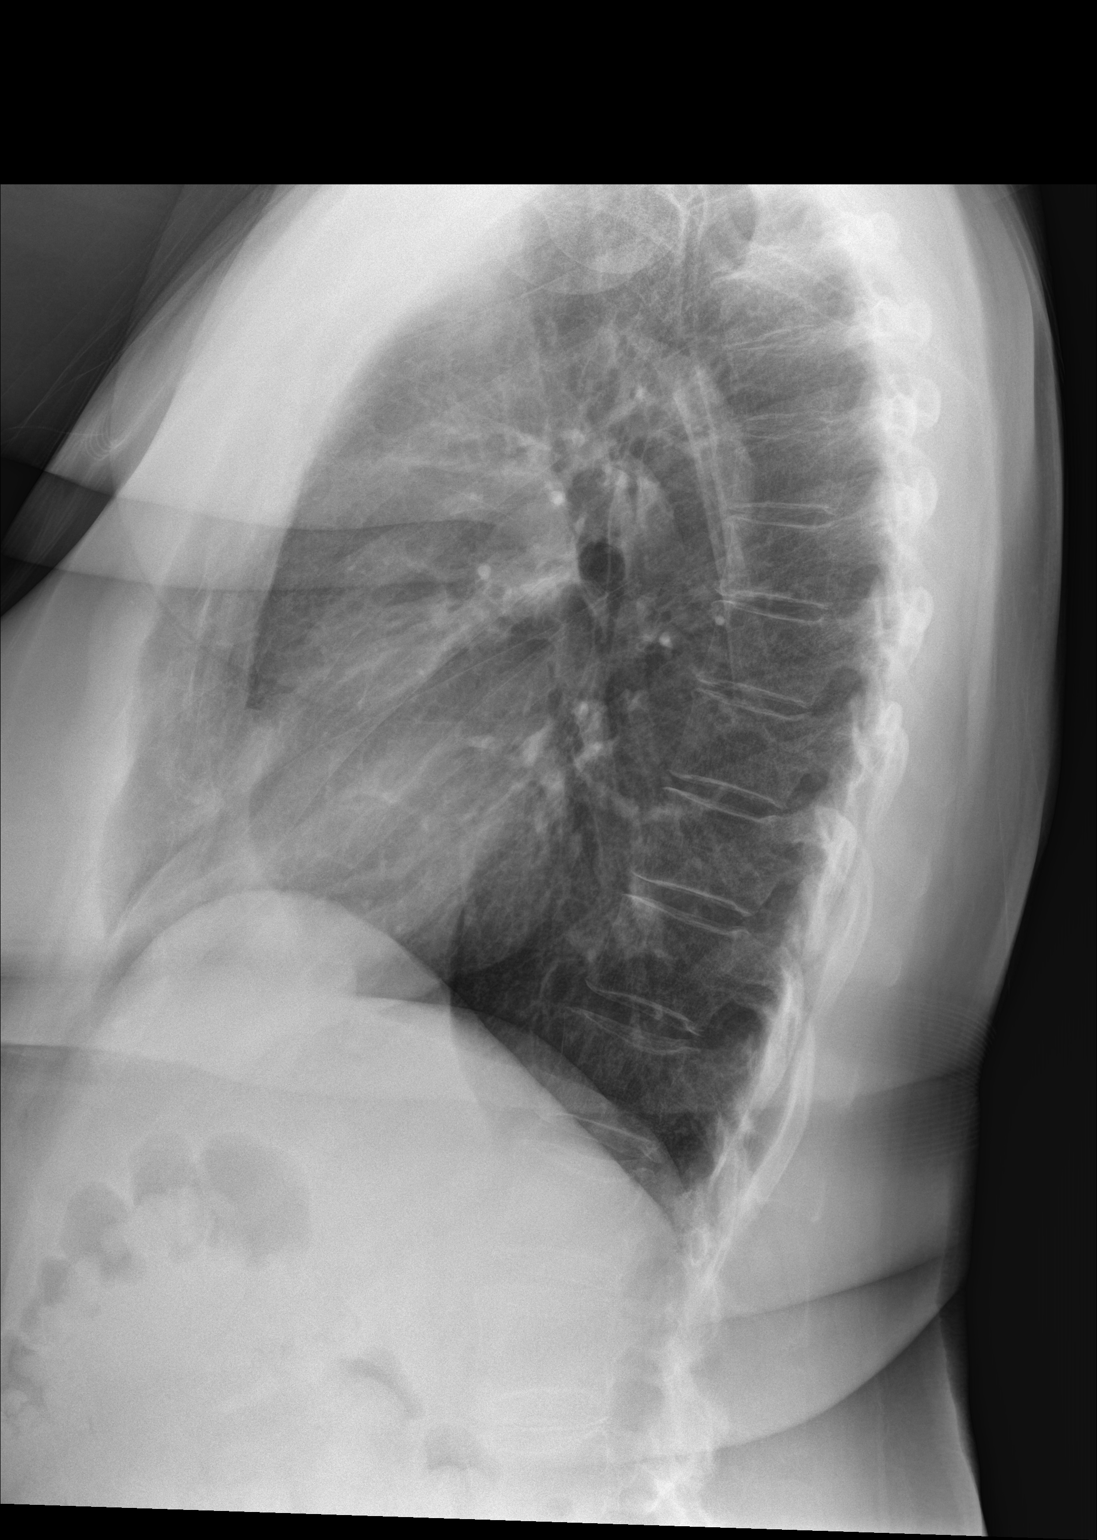

[2 of 2 positions shown; findings below may reference images not displayed]

FINDINGS: Normal heart, mediastinum and hila.

Mild stable scarring at the lung apices. Lungs otherwise clear. No
pleural effusion. No pneumothorax.

Bony thorax is intact.
IMPRESSION: No active cardiopulmonary disease.

## 2015-12-16 MED ORDER — ALBUTEROL SULFATE HFA 108 (90 BASE) MCG/ACT IN AERS: 2.0000 | INHALATION_SPRAY | Freq: Four times a day (QID) | RESPIRATORY_TRACT | Status: DC | PRN

## 2015-12-16 MED ORDER — SALINE SPRAY 0.65 % NA SOLN: 2.0000 | NASAL | Status: DC

## 2015-12-16 MED ORDER — AMOXICILLIN-POT CLAVULANATE 875-125 MG PO TABS: 1.0000 | ORAL_TABLET | Freq: Two times a day (BID) | ORAL | Status: DC

## 2015-12-16 MED ORDER — IPRATROPIUM-ALBUTEROL 0.5-2.5 (3) MG/3ML IN SOLN
3.0000 mL | Freq: Once | RESPIRATORY_TRACT | Status: AC
  Administered 2015-12-16: 3 mL via RESPIRATORY_TRACT

## 2015-12-16 MED ORDER — PREDNISONE 20 MG PO TABS: 40.0000 mg | ORAL_TABLET | Freq: Every day | ORAL | Status: AC

## 2015-12-16 MED ORDER — FLUTICASONE PROPIONATE 50 MCG/ACT NA SUSP: 1.0000 | Freq: Two times a day (BID) | NASAL | Status: DC

## 2015-12-16 NOTE — ED Provider Notes (Addendum)
CSN: [id number]     Arrival date & time 12/16/15  1256 History   First MD Initiated Contact with Patient 12/16/15 1325     Chief Complaint  Patient presents with  . Weakness   (Consider location/radiation/quality/duration/timing/severity/associated sxs/prior Treatment) HPI Comments: Married caucasian female here for evaluation of increased temp 99-99.5 since Wednesday 17 May, watched granddaughter this week (babysat), denied sick contacts, productive cough, slight SOB and fatigue/weakness with activity.  Hit mouth on car door when getting out groceries this past week gum lacerated healing.  Denied seasonal allergies.   + sore throat history of rheumatic fever treated with penicillin typically throat not that sore when strep + test.  Established patient inhaler expired needs refill.  Patient reported anterior throat feels full/lymph nodes TTP  PMHx bronchitis March 2017 had been feeling better but not 100% since then  Patient is a 65 y.o. female presenting with weakness. The history is provided by the patient.  Weakness This is a new problem. The current episode started more than 2 days ago. The problem occurs constantly. The problem has been gradually worsening. Associated symptoms include shortness of breath. Pertinent negatives include no chest pain, no abdominal pain and no headaches. The symptoms are aggravated by walking and exertion. The symptoms are relieved by rest. She has tried water, food and rest for the symptoms. The treatment provided mild relief.    Past Medical History  Diagnosis Date  . Arthritis   . Chicken pox   . Rheumatic fever   . MRSA exposure 2005    Spider bite  . Cholecystitis 11/2011    Did not require sgy - Dr. Staci Acosta - [facility]  (cholelithiasis)  . MVP (mitral valve prolapse)     Stable - Dr. Ubaldo Glassing  . Hypertension   . IBS (irritable bowel syndrome)   . H/O Clostridium difficile infection    Past Surgical History  Procedure Laterality Date  . Muscle biopsy     . Cataract extraction  [id number]   Family History  Problem Relation Age of Onset  . Arthritis Mother   . Stroke Mother   . Diabetes Mother   . Hypertension Mother   . Arthritis Father   . Stroke Father   . Diabetes Paternal Grandmother   . Cancer Paternal Uncle     colon  . Breast cancer Maternal Aunt   . Heart disease      maternal and paternal side   Social History  Substance Use Topics  . Smoking status: Never Smoker   . Smokeless tobacco: Never Used  . Alcohol Use: 0.0 oz/week    0 Standard drinks or equivalent per week     Comment: Rarely   OB History    No data available     Review of Systems  Constitutional: Positive for fever, activity change and fatigue. Negative for chills, diaphoresis, appetite change and unexpected weight change.  HENT: Positive for postnasal drip and sore throat. Negative for congestion, dental problem, drooling, ear discharge, ear pain, facial swelling, hearing loss, mouth sores, nosebleeds, rhinorrhea, sinus pressure, sneezing, tinnitus, trouble swallowing and voice change.   Eyes: Negative for photophobia, pain, discharge, redness, itching and visual disturbance.  Respiratory: Positive for shortness of breath. Negative for cough, choking, chest tightness, wheezing and stridor.   Cardiovascular: Negative for chest pain, palpitations and leg swelling.  Gastrointestinal: Negative for nausea, vomiting, abdominal pain, diarrhea, constipation, blood in stool and abdominal distention.  Endocrine: Negative for cold intolerance and heat intolerance.  Genitourinary:  Negative for dysuria, hematuria and difficulty urinating.  Musculoskeletal: Negative for myalgias, back pain, joint swelling, arthralgias, gait problem, neck pain and neck stiffness.  Skin: Negative for color change, pallor, rash and wound.  Allergic/Immunologic: Negative for environmental allergies and food allergies.  Neurological: Positive for weakness. Negative for dizziness, tremors,  seizures, syncope, facial asymmetry, speech difficulty, light-headedness, numbness and headaches.  Hematological: Negative for adenopathy. Does not bruise/bleed easily.  Psychiatric/Behavioral: Negative for behavioral problems, confusion, sleep disturbance and agitation.    Allergies  Adhesive and Yellow dyes (non-tartrazine)  Home Medications   Prior to Admission medications   Medication Sig Start Date End Date Taking? Authorizing Provider  albuterol (PROVENTIL HFA;VENTOLIN HFA) 108 (90 Base) MCG/ACT inhaler Inhale 2 puffs into the lungs every 6 (six) hours as needed for wheezing or shortness of breath. 12/16/15 12/22/15  Olen Cordial, NP  amoxicillin-clavulanate (AUGMENTIN) 875-125 MG tablet Take 1 tablet by mouth every 12 (twelve) hours. 12/16/15   Olen Cordial, NP  benzonatate (TESSALON PERLES) 100 MG capsule Take 1 capsule (100 mg total) by mouth 3 (three) times daily as needed for cough. 09/27/15   Marylene Land, NP  chlorpheniramine-HYDROcodone [facility] PENNKINETIC ER) 10-8 MG/5ML SUER Take 5 mLs by mouth at bedtime as needed for cough (do not drive or operate machinery while taking as can cause drowsiness). 09/27/15   Marylene Land, NP  fluticasone (FLONASE) 50 MCG/ACT nasal spray Place 1 spray into both nostrils 2 (two) times daily. 12/16/15 12/30/15  Olen Cordial, NP  predniSONE (DELTASONE) 20 MG tablet Take 2 tablets (40 mg total) by mouth daily with breakfast. 12/17/15 12/20/15  Olen Cordial, NP  sodium chloride (OCEAN) 0.65 % SOLN nasal spray Place 2 sprays into both nostrils every 2 (two) hours while awake. 12/16/15   Olen Cordial, NP   Meds Ordered and Administered this Visit   Medications  ipratropium-albuterol (DUONEB) 0.5-2.5 (3) MG/3ML nebulizer solution 3 mL (3 mLs Nebulization Given 12/16/15 1353)    BP 130/87 mmHg  Pulse 104  Temp(Src) 98.4 F (36.9 C) (Oral)  Ht 5\' 7"  (1.702 m)  Wt 183 lb (83.008 kg)  BMI 28.66 kg/m2  SpO2 96% No data  found.   Physical Exam  Constitutional: She is oriented to person, place, and time. Vital signs are normal. She appears well-developed and well-nourished. She is active and cooperative.  Non-toxic appearance. She does not have a sickly appearance. She does not appear ill. No distress.  HENT:  Head: Normocephalic and atraumatic.  Right Ear: Hearing, external ear and ear canal normal. A middle ear effusion is present.  Left Ear: Hearing, external ear and ear canal normal. A middle ear effusion is present.  Nose: Mucosal edema and rhinorrhea present. No nose lacerations, sinus tenderness, nasal deformity, septal deviation or nasal septal hematoma. No epistaxis.  No foreign bodies. Right sinus exhibits no maxillary sinus tenderness and no frontal sinus tenderness. Left sinus exhibits no maxillary sinus tenderness and no frontal sinus tenderness.  Mouth/Throat: Uvula is midline and mucous membranes are normal. Mucous membranes are not pale, not dry and not cyanotic. She does not have dentures. No oral lesions. No trismus in the jaw. Normal dentition. No dental abscesses, uvula swelling, lacerations or dental caries. Posterior oropharyngeal edema and posterior oropharyngeal erythema present. No oropharyngeal exudate or tonsillar abscesses.  Cobblestoning posterior pharynx; bilateral TMs with air fluid level clear; bilateral nasal turbinates with edema/erythema clear discharge; bilateral allergic shiners; upper left gums slightly erythema/edema where patient stated car  door hit mouth  Eyes: Conjunctivae, EOM and lids are normal. Pupils are equal, round, and reactive to light. Right eye exhibits no chemosis, no discharge, no exudate and no hordeolum. No foreign body present in the right eye. Left eye exhibits no chemosis, no discharge, no exudate and no hordeolum. No foreign body present in the left eye. Right conjunctiva is not injected. Right conjunctiva has no hemorrhage. Left conjunctiva is not injected.  Left conjunctiva has no hemorrhage. No scleral icterus. Right eye exhibits normal extraocular motion and no nystagmus. Left eye exhibits normal extraocular motion and no nystagmus. Right pupil is round and reactive. Left pupil is round and reactive. Pupils are equal.  Neck: Trachea normal and normal range of motion. Neck supple. No tracheal tenderness, no spinous process tenderness and no muscular tenderness present. No rigidity. No tracheal deviation, no edema, no erythema and normal range of motion present. No thyroid mass and no thyromegaly present.  Cardiovascular: Normal rate, regular rhythm, S1 normal, S2 normal, normal heart sounds and intact distal pulses.  PMI is not displaced.  Exam reveals no gallop and no friction rub.   No murmur heard. Pulmonary/Chest: Effort normal. No accessory muscle usage or stridor. No respiratory distress. She has decreased breath sounds in the right lower field and the left lower field. She has wheezes in the right upper field and the left upper field. She has no rhonchi. She has no rales. She exhibits no tenderness.  Inspiratory wheeze fine bilaterally; bilateral lower breath sounds decreased  Abdominal: Soft. She exhibits no distension.  Musculoskeletal: Normal range of motion. She exhibits no edema or tenderness.       Right shoulder: Normal.       Left shoulder: Normal.       Right hip: Normal.       Left hip: Normal.       Right knee: Normal.       Left knee: Normal.       Cervical back: Normal.       Right hand: Normal.       Left hand: Normal.  Lymphadenopathy:       Head (right side): No submental, no submandibular, no tonsillar, no preauricular, no posterior auricular and no occipital adenopathy present.       Head (left side): No submental, no submandibular, no tonsillar, no preauricular, no posterior auricular and no occipital adenopathy present.    She has no cervical adenopathy.       Right cervical: No superficial cervical, no deep cervical and  no posterior cervical adenopathy present.      Left cervical: No superficial cervical, no deep cervical and no posterior cervical adenopathy present.  Neurological: She is alert and oriented to person, place, and time. She has normal strength. She is not disoriented. She displays no atrophy and no tremor. No cranial nerve deficit or sensory deficit. She exhibits normal muscle tone. She displays no seizure activity. Coordination and gait normal. GCS eye subscore is 4. GCS verbal subscore is 5. GCS motor subscore is 6.  Skin: Skin is warm, dry and intact. No abrasion, no bruising, no burn, no ecchymosis, no laceration, no lesion, no petechiae and no rash noted. She is not diaphoretic. No cyanosis or erythema. No pallor. Nails show no clubbing.  Psychiatric: She has a normal mood and affect. Her speech is normal and behavior is normal. Judgment and thought content normal. Cognition and memory are normal.  Nursing note and vitals reviewed.   ED Course  Procedures (including critical care time)  Labs Review Labs Reviewed  RAPID STREP SCREEN (NOT AT [facility])  CULTURE, GROUP A STREP [facility])    Imaging Review Dg Chest 2 View  12/16/2015  CLINICAL DATA:  PT with SOB three days with activity. No hx of COPD. Pt was exposed to olive oil burning off in her oven for about 30 minutes yesterday. EXAM: CHEST  2 VIEW COMPARISON:  09/27/2015 FINDINGS: Normal heart, mediastinum and hila. Mild stable scarring at the lung apices. Lungs otherwise clear. No pleural effusion. No pneumothorax. Bony thorax is intact. IMPRESSION: No active cardiopulmonary disease. Electronically Signed   By: Lajean Manes M.D.   On: 12/16/2015 14:20    1340 refused offer of water; on/off exam table without difficulty, speaking in full sentences.  Pending rapid strep, chest xray and duoneb administration ordered.  Patient agreed plan of care and no further questions at this time.   1353 Duoneb 41ml administered by RN Tula Nakayama  1415  patient vomited post-tussive coughing during duoneb.  Refused offer of zofran/antiemetic post-tussive emesis.  Patient reported breathing improved having post nasal drip.  Discussed negative rapid strep results and wet read xray bronchitis will call if radiologist has any other findings.  Prednisone 40mg  po daily starting with breakfast tomorrow x 4 days.  Albuterol inhaler 2 puffs po QID on  Schedule x 48 hours then prn congestion/wheeze.  Flonase 1 spray each nostril BID, nasal saline 2 sprays each nostril BID minimum q2h prn congestion.  Patient has tussionex and tessalon pearles still at home from March bronchitis to use prn cough.  Refused offer of zofran in clinic or for at home use nausea/post tussive cough.  Rx Augmentin 875mg  po BID x 10 days given to patient if + throat culture, worsening throat pain/sinus pressure, worsening productive cough as covers for pneumonia/bronchitis/sinusitis/otitis media discussed with patient.  Patient verbalized understanding information/instructions, agreed with plan of care and had no further questions at thist ime. MDM   1. Acute bronchitis due to Rhinovirus   2. Otitis media with effusion, bilateral   3. Laceration of upper gum without complication, initial encounter   4. Acute rhinosinusitis    Patient may use normal saline nasal spray as needed.  Consider antihistamine or nasal steroid use. Refilled flonase 1 spray each nostril BID for patient. Avoid triggers if possible.  Shower prior to bedtime if exposed to triggers.  If allergic dust/dust mites recommend mattress/pillow covers/encasements; washing linens, vacuuming, sweeping, dusting weekly.  Call or return to clinic as needed if these symptoms worsen or fail to improve as anticipated.   Exitcare handout on allergic rhinitis given to patient.  Patient verbalized understanding of instructions, agreed with plan of care and had no further questions at this time.  P2:  Avoidance and hand washing.  Supportive  treatment.   No evidence of invasive bacterial infection, non toxic and well hydrated.  This is most likely self limiting viral infection.  I do not see where any further testing or imaging is necessary at this time.   I will suggest supportive care, rest, good hygiene and encourage the patient to take adequate fluids.  The patient is to return to clinic or EMERGENCY ROOM if symptoms worsen or change significantly e.g. ear pain, fever, purulent discharge from ears or bleeding.  Exitcare handout on otitis media with effusion given to patient.  Patient verbalized agreement and understanding of treatment plan.    Patient notified rapid strep negative.  Suspect Viral illness: no  evidence of invasive bacterial infection, non toxic and well hydrated.  This is most likely self limiting viral infection.  I do not see where any further testing or imaging is necessary at this time.   I will suggest supportive care, rest, good hygiene and encourage the patient to take adequate fluids.  Does not require work excuse.  Notified patient staff will call with culture results once available next 48+ hours.  flonase 1 spray each nostril BID prn, nasal saline 1-2 sprays each nostril prn q2h, tylenol 1000mg  po QID prn.  Discussed honey with lemon and salt water gargles for comfort also.  The patient is to return to clinic or EMERGENCY ROOM if symptoms worsen or change significantly e.g. fever, lethargy, SOB, wheezing.  Exitcare handout on viral illness given to patient.  Patient verbalized agreement and understanding of treatment plan.     Restart flonase 1 spray each nostril BID, saline 2 sprays each nostril q2h prn congestion.  If no improvement with 48 hours of saline and flonase use start augmentin 875mg  po BID x 10 days.  Rx given.  No evidence of systemic bacterial infection, non toxic and well hydrated.  I do not see where any further testing or imaging is necessary at this time.   I will suggest supportive care, rest, good  hygiene and encourage the patient to take adequate fluids.  The patient is to return to clinic or EMERGENCY ROOM if symptoms worsen or change significantly.  Exitcare handout on sinusitis given to patient.  Patient verbalized agreement and understanding of treatment plan and had no further questions at this time.   P2:  Hand washing and cover cough  Prednisone 40mg  po daily x 4 days.  Albuterol inhaler 2 puffs po q4-6h prn protracted cough/wheeze.  Tessalon pearles 100mg  po TID prn cough.  Tussionex 90ml po bedtime prn cough avoid driving or alcohol intake after taking medication x 8 hours.  Bronchitis simple, community acquired, may have started as viral (probably respiratory syncytial, parainfluenza, influenza, or adenovirus), but now evidence of acute purulent bronchitis with resultant bronchial edema and mucus formation.  Viruses are the most common cause of bronchial inflammation in otherwise healthy adults with acute bronchitis.  The appearance of sputum is not predictive of whether a bacterial infection is present.  Purulent sputum is most often caused by viral infections.  There are a small portion of those caused by non-viral agents being Mycoplamsa pneumonia.  Microscopic examination or C&S of sputum in the healthy adult with acute bronchitis is generally not helpful (usually negative or normal respiratory flora) other considerations being cough from upper respiratory tract infections, sinusitis or allergic syndromes (mild asthma or viral pneumonia).  Differential Diagnosis:  reactive airway disease (asthma, allergic aspergillosis (eosinophilia), chronic bronchitis, respiratory infection (Sinusitis, Common cold, pneumonia), congestive heart failure, reflux esophagitis, bronchogenic tumor, aspiration syndromes and/or exposure irritants/tobacco smoke.  In this case, there is no evidence of any invasive bacterial illness.  Most likely viral etiology so will hold on antibiotic treatment.  Advise supportive  care with rest, encourage fluids, good hygiene and watch for any worsening symptoms.  If they were to develop:  come back to the office or go to the emergency room if after hours. Without high fever, severe dyspnea, lack of physical findings or other risk factors, I will hold on a chest radiograph and CBC at this time. I discussed that approximately 50% of patients with acute bronchitis have a cough that lasts up to three weeks, and 25%  for over a month.  Tylenol, one to two tablets every four hours as needed for fever or myalgias.   No aspirin.  Patient instructed to follow up in one week or sooner if symptoms worsen. Patient verbalized agreement and understanding of treatment plan.  P2:  hand washing and cover cough  Rapid strep negative.  Rx augmentin 875mg  po BID x 10 days given for use if throat culture positive.  Will call with throat culture results typically available in 48 hours.  Usually no specific medical treatment is needed if a virus is causing the sore throat.  The throat most often gets better on its own within 5 to 7 days.  Antibiotic medicine does not cure viral pharyngitis.   For acute pharyngitis caused by bacteria, your healthcare provider will prescribe an antibiotic.  Marland Kitchen Do not smoke.  Marland Kitchen Avoid secondhand smoke and other air pollutants.  . Use a cool mist humidifier to add moisture to the air.  . Get plenty of rest.  . You may want to rest your throat by talking less and eating a diet that is mostly liquid or soft for a day or two.   Marland Kitchen Nonprescription throat lozenges and mouthwashes should help relieve the soreness.   . Gargling with warm saltwater and drinking warm liquids may help.  (You can make a saltwater solution by adding 1/4 teaspoon of salt to 8 ounces, or 240 mL, of warm water.)  . A nonprescription pain reliever such as aspirin, acetaminophen, or ibuprofen may ease general aches and pains.   FOLLOW UP with clinic provider if no improvements in the next 7-10 days.   Patient verbalized understanding of instructions and agreed with plan of care. P2:  Hand washing and diet.    Olen Cordial, NP 12/16/15 [place], NP 12/18/15 [phone]

## 2015-12-16 NOTE — ED Notes (Signed)
As per pt Onset 4 days had mild fever around 99 3 days ago Shortness of breath no sinus infection.

## 2015-12-16 NOTE — Discharge Instructions (Signed)
Acute Bronchitis Bronchitis is inflammation of the airways that extend from the windpipe into the lungs (bronchi). The inflammation often causes mucus to develop. This leads to a cough, which is the most common symptom of bronchitis.  In acute bronchitis, the condition usually develops suddenly and goes away over time, usually in a couple weeks. Smoking, allergies, and asthma can make bronchitis worse. Repeated episodes of bronchitis may cause further lung problems.  CAUSES Acute bronchitis is most often caused by the same virus that causes a cold. The virus can spread from person to person (contagious) through coughing, sneezing, and touching contaminated objects. SIGNS AND SYMPTOMS   Cough.   Fever.   Coughing up mucus.   Body aches.   Chest congestion.   Chills.   Shortness of breath.   Sore throat.  DIAGNOSIS  Acute bronchitis is usually diagnosed through a physical exam. Your health care provider will also ask you questions about your medical history. Tests, such as chest X-rays, are sometimes done to rule out other conditions.  TREATMENT  Acute bronchitis usually goes away in a couple weeks. Oftentimes, no medical treatment is necessary. Medicines are sometimes given for relief of fever or cough. Antibiotic medicines are usually not needed but may be prescribed in certain situations. In some cases, an inhaler may be recommended to help reduce shortness of breath and control the cough. A cool mist vaporizer may also be used to help thin bronchial secretions and make it easier to clear the chest.  HOME CARE INSTRUCTIONS  Get plenty of rest.   Drink enough fluids to keep your urine clear or pale yellow (unless you have a medical condition that requires fluid restriction). Increasing fluids may help thin your respiratory secretions (sputum) and reduce chest congestion, and it will prevent dehydration.   Take medicines only as directed by your health care provider.  If  you were prescribed an antibiotic medicine, finish it all even if you start to feel better.  Avoid smoking and secondhand smoke. Exposure to cigarette smoke or irritating chemicals will make bronchitis worse. If you are a smoker, consider using nicotine gum or skin patches to help control withdrawal symptoms. Quitting smoking will help your lungs heal faster.   Reduce the chances of another bout of acute bronchitis by washing your hands frequently, avoiding people with cold symptoms, and trying not to touch your hands to your mouth, nose, or eyes.   Keep all follow-up visits as directed by your health care provider.  SEEK MEDICAL CARE IF: Your symptoms do not improve after 1 week of treatment.  SEEK IMMEDIATE MEDICAL CARE IF:  You develop an increased fever or chills.   You have chest pain.   You have severe shortness of breath.  You have bloody sputum.   You develop dehydration.  You faint or repeatedly feel like you are going to pass out.  You develop repeated vomiting.  You develop a severe headache. MAKE SURE YOU:   Understand these instructions.  Will watch your condition.  Will get help right away if you are not doing well or get worse.   This information is not intended to replace advice given to you by your health care provider. Make sure you discuss any questions you have with your health care provider.   Document Released: 08/22/2004 Document Revised: 08/05/2014 Document Reviewed: 01/05/2013 Elsevier Interactive Patient Education 2016 [place] Pain Dental pain may be caused by many things, including:  Tooth decay (cavities or caries). Cavities expose  the nerve of your tooth to air and hot or cold temperatures. This can cause pain or discomfort.  Abscess or infection. A dental abscess is a collection of infected pus from a bacterial infection in the inner part of the tooth (pulp). It usually occurs at the end of the tooth's root.  Injury.  An  unknown reason (idiopathic). Your pain may be mild or severe. It may only occur when:  You are chewing.  You are exposed to hot or cold temperature.  You are eating or drinking sugary foods or beverages, such as soda or candy. Your pain may also be constant. HOME CARE INSTRUCTIONS Watch your dental pain for any changes. The following actions may help to lessen any discomfort that you are feeling:  Take medicines only as directed by your dentist.  If you were prescribed an antibiotic medicine, finish all of it even if you start to feel better.  Keep all follow-up visits as directed by your dentist. This is important.  Do not apply heat to the outside of your face.  Rinse your mouth or gargle with salt water if directed by your dentist. This helps with pain and swelling.  You can make salt water by adding  tsp of salt to 1 cup of warm water.  Apply ice to the painful area of your face:  Put ice in a plastic bag.  Place a towel between your skin and the bag.  Leave the ice on for 20 minutes, 2-3 times per day.  Avoid foods or drinks that cause you pain, such as:  Very hot or very cold foods or drinks.  Sweet or sugary foods or drinks. SEEK MEDICAL CARE IF:  Your pain is not controlled with medicines.  Your symptoms are worse.  You have new symptoms. SEEK IMMEDIATE MEDICAL CARE IF:  You are unable to open your mouth.  You are having trouble breathing or swallowing.  You have a fever.  Your face, neck, or jaw is swollen.   This information is not intended to replace advice given to you by your health care provider. Make sure you discuss any questions you have with your health care provider.   Document Released: 07/15/2005 Document Revised: 11/29/2014 Document Reviewed: 07/11/2014 Elsevier Interactive Patient Education 2016 [employer]. Allergic Rhinitis Allergic rhinitis is when the mucous membranes in the nose respond to allergens. Allergens are particles in  the air that cause your body to have an allergic reaction. This causes you to release allergic antibodies. Through a chain of events, these eventually cause you to release histamine into the blood stream. Although meant to protect the body, it is this release of histamine that causes your discomfort, such as frequent sneezing, congestion, and an itchy, runny nose.  CAUSES Seasonal allergic rhinitis (hay fever) is caused by pollen allergens that may come from grasses, trees, and weeds. Year-round allergic rhinitis (perennial allergic rhinitis) is caused by allergens such as house dust mites, pet dander, and mold spores. SYMPTOMS  Nasal stuffiness (congestion).  Itchy, runny nose with sneezing and tearing of the eyes. DIAGNOSIS Your health care provider can help you determine the allergen or allergens that trigger your symptoms. If you and your health care provider are unable to determine the allergen, skin or blood testing may be used. Your health care provider will diagnose your condition after taking your health history and performing a physical exam. Your health care provider may assess you for other related conditions, such as asthma, pink eye, or an  ear infection. TREATMENT Allergic rhinitis does not have a cure, but it can be controlled by:  Medicines that block allergy symptoms. These may include allergy shots, nasal sprays, and oral antihistamines.  Avoiding the allergen. Hay fever may often be treated with antihistamines in pill or nasal spray forms. Antihistamines block the effects of histamine. There are over-the-counter medicines that may help with nasal congestion and swelling around the eyes. Check with your health care provider before taking or giving this medicine. If avoiding the allergen or the medicine prescribed do not work, there are many new medicines your health care provider can prescribe. Stronger medicine may be used if initial measures are ineffective. Desensitizing  injections can be used if medicine and avoidance does not work. Desensitization is when a patient is given ongoing shots until the body becomes less sensitive to the allergen. Make sure you follow up with your health care provider if problems continue. HOME CARE INSTRUCTIONS It is not possible to completely avoid allergens, but you can reduce your symptoms by taking steps to limit your exposure to them. It helps to know exactly what you are allergic to so that you can avoid your specific triggers. SEEK MEDICAL CARE IF:  You have a fever.  You develop a cough that does not stop easily (persistent).  You have shortness of breath.  You start wheezing.  Symptoms interfere with normal daily activities.   This information is not intended to replace advice given to you by your health care provider. Make sure you discuss any questions you have with your health care provider.   Document Released: 04/09/2001 Document Revised: 08/05/2014 Document Reviewed: 03/22/2013 Elsevier Interactive Patient Education 2016 [employer] Sinusitis, Adult Sinusitis is redness, soreness, and inflammation of the paranasal sinuses. Paranasal sinuses are air pockets within the bones of your face. They are located beneath your eyes, in the middle of your forehead, and above your eyes. In healthy paranasal sinuses, mucus is able to drain out, and air is able to circulate through them by way of your nose. However, when your paranasal sinuses are inflamed, mucus and air can become trapped. This can allow bacteria and other germs to grow and cause infection. Sinusitis can develop quickly and last only a short time (acute) or continue over a long period (chronic). Sinusitis that lasts for more than 12 weeks is considered chronic. CAUSES Causes of sinusitis include:  Allergies.  Structural abnormalities, such as displacement of the cartilage that separates your nostrils (deviated septum), which can decrease the air flow  through your nose and sinuses and affect sinus drainage.  Functional abnormalities, such as when the small hairs (cilia) that line your sinuses and help remove mucus do not work properly or are not present. SIGNS AND SYMPTOMS Symptoms of acute and chronic sinusitis are the same. The primary symptoms are pain and pressure around the affected sinuses. Other symptoms include:  Upper toothache.  Earache.  Headache.  Bad breath.  Decreased sense of smell and taste.  A cough, which worsens when you are lying flat.  Fatigue.  Fever.  Thick drainage from your nose, which often is green and may contain pus (purulent).  Swelling and warmth over the affected sinuses. DIAGNOSIS Your health care provider will perform a physical exam. During your exam, your health care provider may perform any of the following to help determine if you have acute sinusitis or chronic sinusitis:  Look in your nose for signs of abnormal growths in your nostrils (nasal polyps).  Tap  over the affected sinus to check for signs of infection.  View the inside of your sinuses using an imaging device that has a light attached (endoscope). If your health care provider suspects that you have chronic sinusitis, one or more of the following tests may be recommended:  Allergy tests.  Nasal culture. A sample of mucus is taken from your nose, sent to a lab, and screened for bacteria.  Nasal cytology. A sample of mucus is taken from your nose and examined by your health care provider to determine if your sinusitis is related to an allergy. TREATMENT Most cases of acute sinusitis are related to a viral infection and will resolve on their own within 10 days. Sometimes, medicines are prescribed to help relieve symptoms of both acute and chronic sinusitis. These may include pain medicines, decongestants, nasal steroid sprays, or saline sprays. However, for sinusitis related to a bacterial infection, your health care provider  will prescribe antibiotic medicines. These are medicines that will help kill the bacteria causing the infection. Rarely, sinusitis is caused by a fungal infection. In these cases, your health care provider will prescribe antifungal medicine. For some cases of chronic sinusitis, surgery is needed. Generally, these are cases in which sinusitis recurs more than 3 times per year, despite other treatments. HOME CARE INSTRUCTIONS  Drink plenty of water. Water helps thin the mucus so your sinuses can drain more easily.  Use a humidifier.  Inhale steam 3-4 times a day (for example, sit in the bathroom with the shower running).  Apply a warm, moist washcloth to your face 3-4 times a day, or as directed by your health care provider.  Use saline nasal sprays to help moisten and clean your sinuses.  Take medicines only as directed by your health care provider.  If you were prescribed either an antibiotic or antifungal medicine, finish it all even if you start to feel better. SEEK IMMEDIATE MEDICAL CARE IF:  You have increasing pain or severe headaches.  You have nausea, vomiting, or drowsiness.  You have swelling around your face.  You have vision problems.  You have a stiff neck.  You have difficulty breathing.   This information is not intended to replace advice given to you by your health care provider. Make sure you discuss any questions you have with your health care provider.   Document Released: 07/15/2005 Document Revised: 08/05/2014 Document Reviewed: 07/30/2011 Elsevier Interactive Patient Education 2016 [place]. Otitis Media With Effusion Otitis media with effusion is the presence of fluid in the middle ear. This is a common problem in children, which often follows ear infections. It may be present for weeks or longer after the infection. Unlike an acute ear infection, otitis media with effusion refers only to fluid behind the ear drum and not infection. Children with  repeated ear and sinus infections and allergy problems are the most likely to get otitis media with effusion. CAUSES  The most frequent cause of the fluid buildup is dysfunction of the eustachian tubes. These are the tubes that drain fluid in the ears to the back of the nose (nasopharynx). SYMPTOMS   The main symptom of this condition is hearing loss. As a result, you or your child may:  Listen to the TV at a loud volume.  Not respond to questions.  Ask "what" often when spoken to.  Mistake or confuse one sound or word for another.  There may be a sensation of fullness or pressure but usually not pain. DIAGNOSIS  Your health care provider will diagnose this condition by examining you or your child's ears.  Your health care provider may test the pressure in you or your child's ear with a tympanometer.  A hearing test may be conducted if the problem persists. TREATMENT   Treatment depends on the duration and the effects of the effusion.  Antibiotics, decongestants, nose drops, and cortisone-type drugs (tablets or nasal spray) may not be helpful.  Children with persistent ear effusions may have delayed language or behavioral problems. Children at risk for developmental delays in hearing, learning, and speech may require referral to a specialist earlier than children not at risk.  You or your child's health care provider may suggest a referral to an ear, nose, and throat surgeon for treatment. The following may help restore normal hearing:  Drainage of fluid.  Placement of ear tubes (tympanostomy tubes).  Removal of adenoids (adenoidectomy). HOME CARE INSTRUCTIONS   Avoid secondhand smoke.  Infants who are breastfed are less likely to have this condition.  Avoid feeding infants while they are lying flat.  Avoid known environmental allergens.  Avoid people who are sick. SEEK MEDICAL CARE IF:   Hearing is not better in 3 months.  Hearing is worse.  Ear  pain.  Drainage from the ear.  Dizziness. MAKE SURE YOU:   Understand these instructions.  Will watch your condition.  Will get help right away if you are not doing well or get worse.   This information is not intended to replace advice given to you by your health care provider. Make sure you discuss any questions you have with your health care provider.   Document Released: 08/22/2004 Document Revised: 08/05/2014 Document Reviewed: 02/09/2013 Elsevier Interactive Patient Education [employer].

## 2015-12-19 LAB — CULTURE, GROUP A STREP (THRC)

## 2015-12-20 ENCOUNTER — Telehealth: Payer: Self-pay | Admitting: Internal Medicine

## 2015-12-20 MED ORDER — AZITHROMYCIN 250 MG PO TABS: 250.0000 mg | ORAL_TABLET | Freq: Every day | ORAL | Status: DC

## 2015-12-20 NOTE — Telephone Encounter (Signed)
Patient notified throat culture negative.  Still having some stomach upset.  Breathing easier cough resolved but chest congestion continues despite albuterol 2p BID, augmentin BID, flonase, saline.  Completed all prednisone.  Denied fevers, SOB, wheezing, headaches.  May start OTC mucinex BID for chest congestion and/or start levofloxacin or azithromycin.  Patient does not want levofloxacin due to black box warning and family member with history of tendon problems after taking it.  Agreed to azithromycin and mucinex and will follow up with PCM if not improvement in symptoms consider reimaging/labs at that time.  Patient verbalized undersatnding of information/instructions, agreed with plan of care and had no further questions at this time.

## 2015-12-26 ENCOUNTER — Ambulatory Visit
Admission: EM | Admit: 2015-12-26 | Discharge: 2015-12-26 | Disposition: A | Discharge status: Home / Self Care | Payer: BLUE CROSS/BLUE SHIELD | Attending: Internal Medicine | Admitting: Internal Medicine

## 2015-12-26 ENCOUNTER — Ambulatory Visit (INDEPENDENT_AMBULATORY_CARE_PROVIDER_SITE_OTHER): Payer: BLUE CROSS/BLUE SHIELD

## 2015-12-26 ENCOUNTER — Encounter: Payer: Self-pay | Admitting: Emergency Medicine

## 2015-12-26 ENCOUNTER — Ambulatory Visit (INDEPENDENT_AMBULATORY_CARE_PROVIDER_SITE_OTHER)
Admit: 2015-12-26 | Discharge: 2015-12-26 | Disposition: A | Discharge status: Home / Self Care | Payer: BLUE CROSS/BLUE SHIELD

## 2015-12-26 DIAGNOSIS — T148 Other injury of unspecified body region: Secondary | ICD-10-CM

## 2015-12-26 DIAGNOSIS — R55 Syncope and collapse: Secondary | ICD-10-CM

## 2015-12-26 DIAGNOSIS — T148XXA Other injury of unspecified body region, initial encounter: Secondary | ICD-10-CM

## 2015-12-26 IMAGING — CT CT HEAD W/O CM
2 series · 14 of 30 positions shown, 16 images · non-contrast
Comparison: None.

CLINICAL DATA: Passed [DATE] days ago, bilateral jaw/chin pain

EXAM:
CT HEAD WITHOUT CONTRAST
CT MAXILLOFACIAL WITHOUT CONTRAST
TECHNIQUE: Multidetector CT imaging of the head and maxillofacial structures
were performed using the standard protocol without intravenous
contrast. Multiplanar CT image reconstructions of the maxillofacial
structures were also generated.

[Series 2: head wo · axial · 0.40mm/px · z∈[-67,+38]mm · 6 of 31 slices shown, 8 images]
[im 5/31  brain]
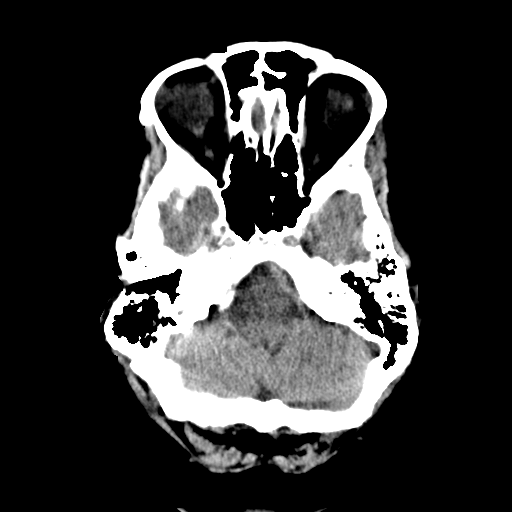
[im 5/31  bone]
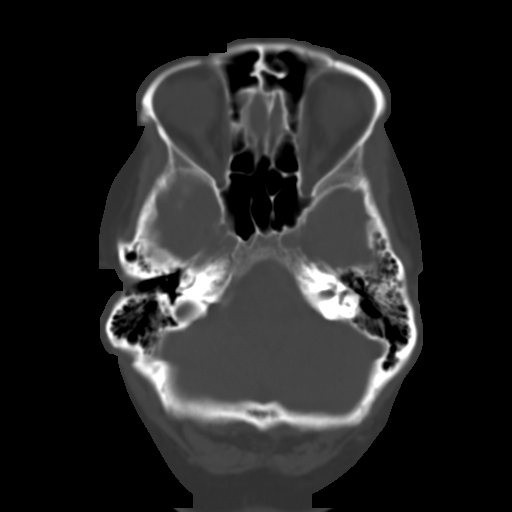
[im 9/31  brain]
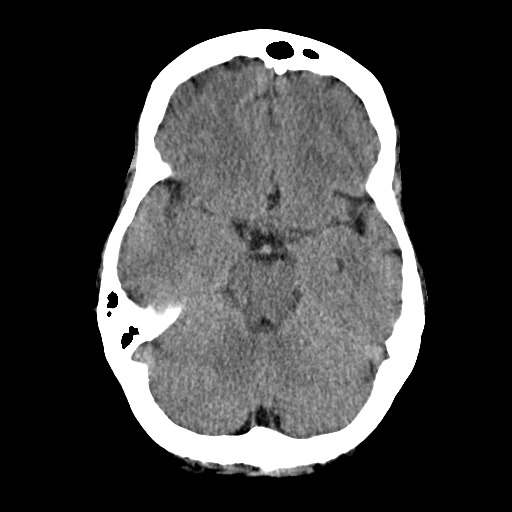
[im 13/31  brain]
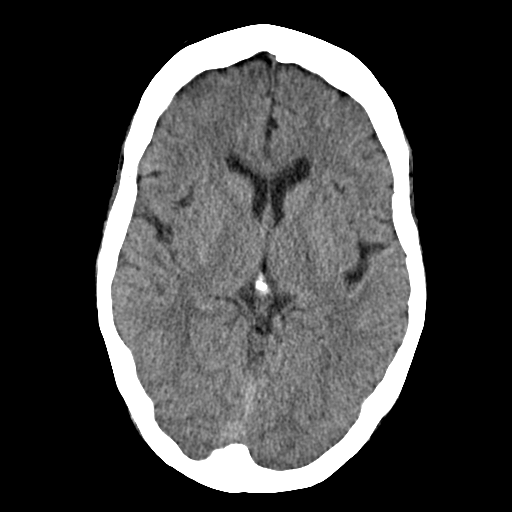
[im 18/31  brain]
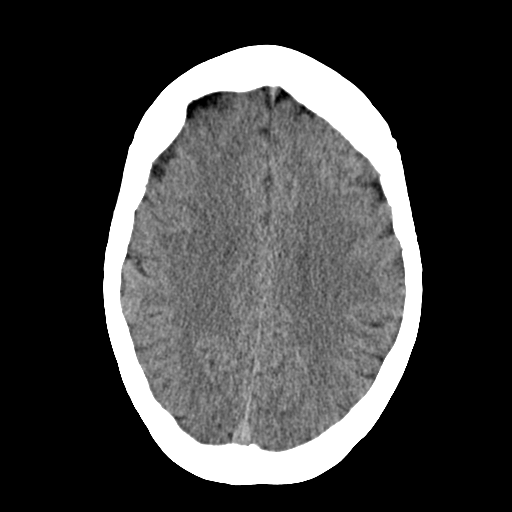
[im 22/31  brain]
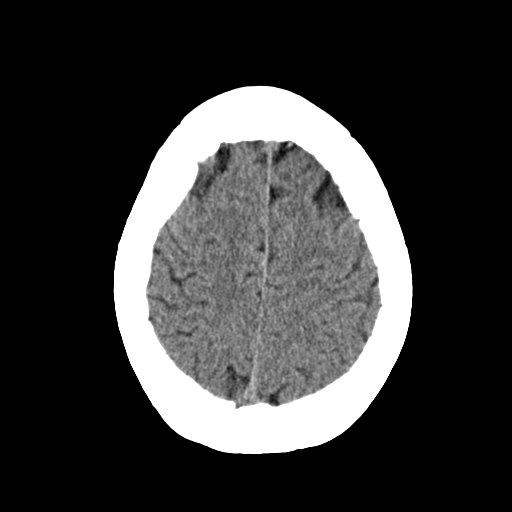
[im 22/31  bone]
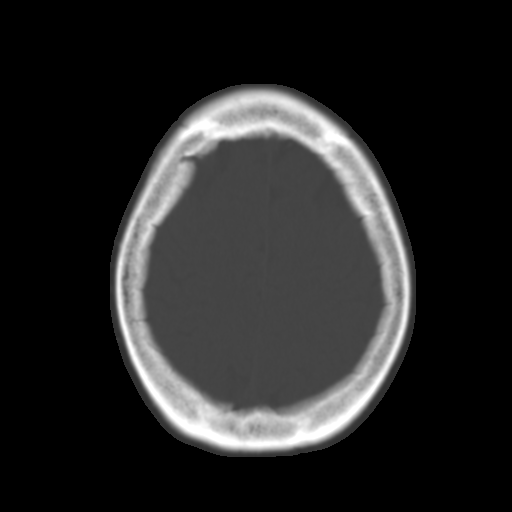
[im 26/31  brain]
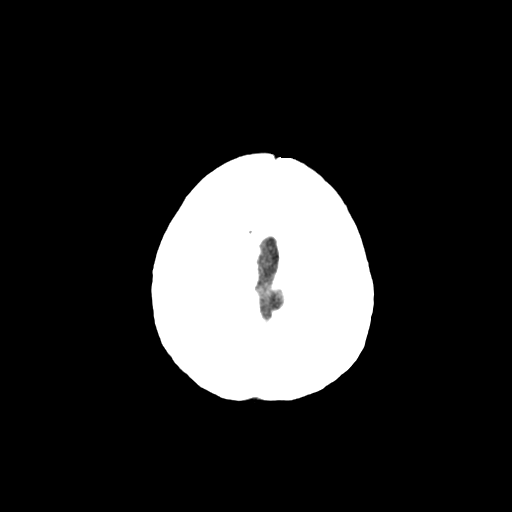

[Series 3: head bone · axial · 0.40mm/px · z∈[-75,+51]mm · 8 of 79 slices shown]
[im 8/79  bone]
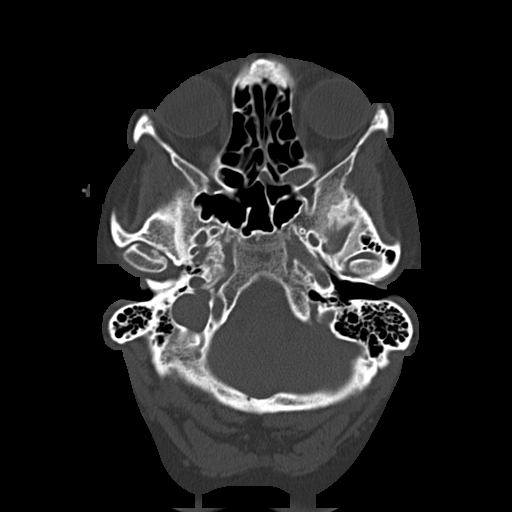
[im 15/79  bone]
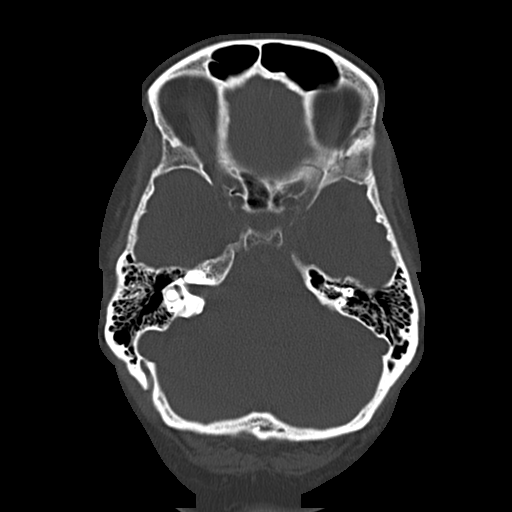
[im 27/79  bone]
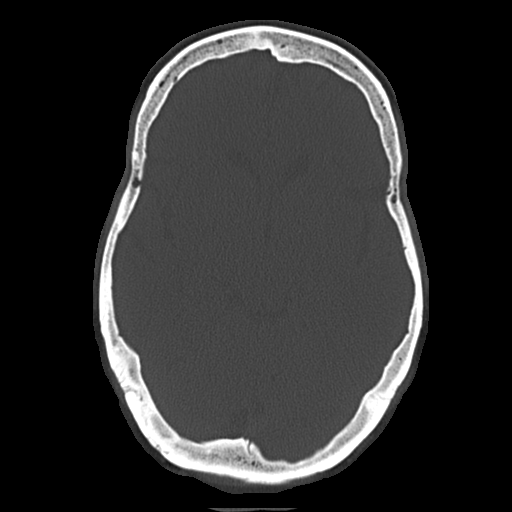
[im 34/79  bone]
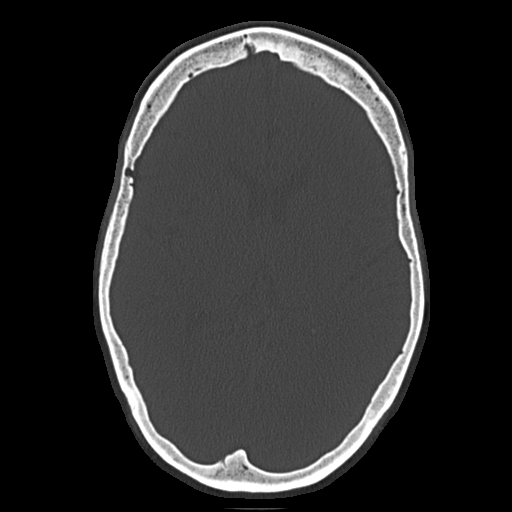
[im 45/79  bone]
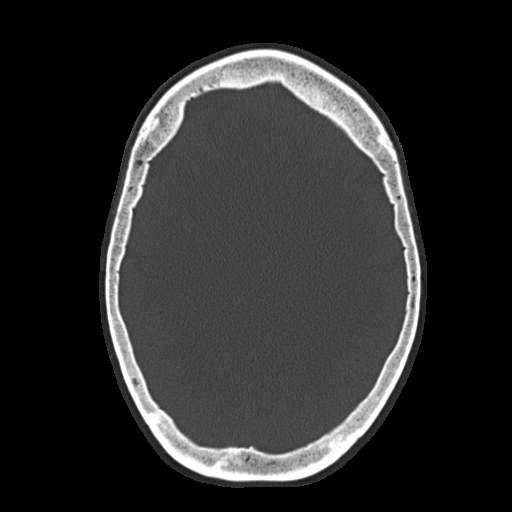
[im 53/79  bone]
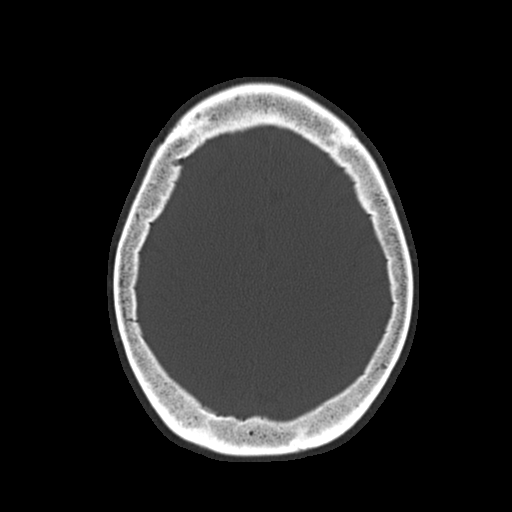
[im 64/79  bone]
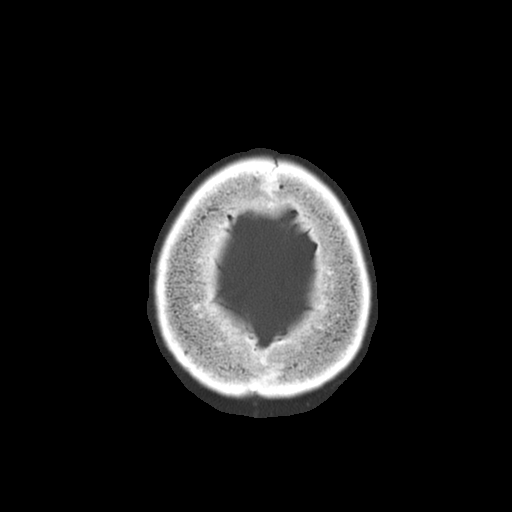
[im 71/79  bone]
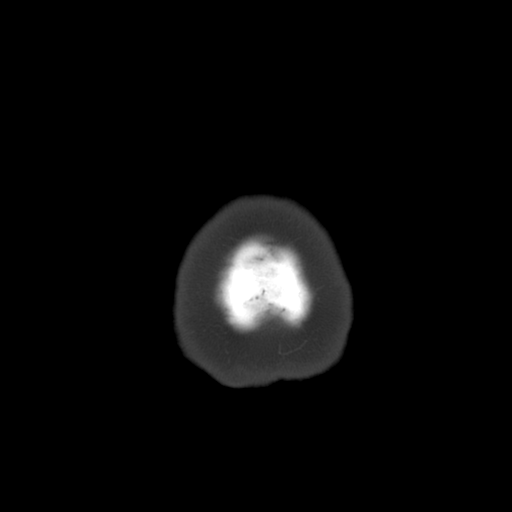

[14 of 30 positions shown; findings below may reference images not displayed]

FINDINGS: CT HEAD FINDINGS

No evidence of parenchymal hemorrhage or extra-axial fluid
collection. No mass lesion, mass effect, or midline shift.

No CT evidence of acute infarction.

Mild subcortical white matter and periventricular small vessel
ischemic changes.

Cerebral volume is within normal limits.  No ventriculomegaly.

The visualized paranasal sinuses are essentially clear. The mastoid
air cells are unopacified.

No evidence of calvarial fracture.

CT MAXILLOFACIAL FINDINGS

No evidence of maxillofacial fracture.

The bilateral orbits, including the globes and retroconal soft
tissues, are within normal limits.

The visualized paranasal sinuses are essentially clear. The mastoid
air cells are unopacified.

The mandible is intact. The bilateral mandibular condyles are
well-seated in the TMJs.

The cervical spine is intact to C5-6.
IMPRESSION: No evidence of acute intracranial abnormality. Mild small vessel
ischemic changes.

Normal maxillofacial CT.  Specifically, the mandible is intact.

Cervical spine is intact to C5-6.

## 2015-12-26 IMAGING — CT CT MAXILLOFACIAL W/O CM
3 of 4 series · 16 of 47 positions shown, 19 images · non-contrast
Comparison: None.

CLINICAL DATA: Passed [DATE] days ago, bilateral jaw/chin pain

EXAM:
CT HEAD WITHOUT CONTRAST
CT MAXILLOFACIAL WITHOUT CONTRAST
TECHNIQUE: Multidetector CT imaging of the head and maxillofacial structures
were performed using the standard protocol without intravenous
contrast. Multiplanar CT image reconstructions of the maxillofacial
structures were also generated.

[Series 2: max soft · axial · 0.33mm/px · z∈[-152,-5]mm · 10 of 114 slices shown, 13 images]
[im 8/114  brain]
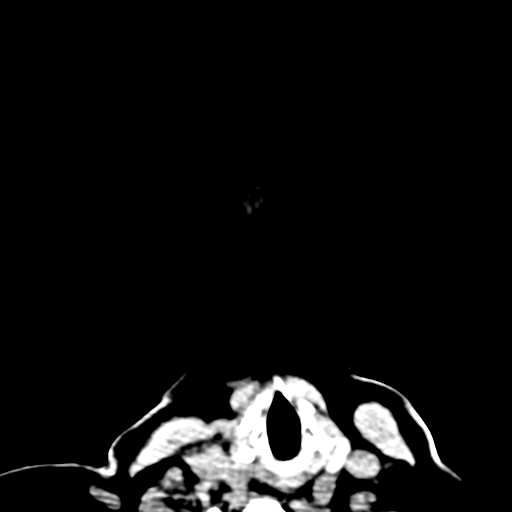
[im 8/114  bone]
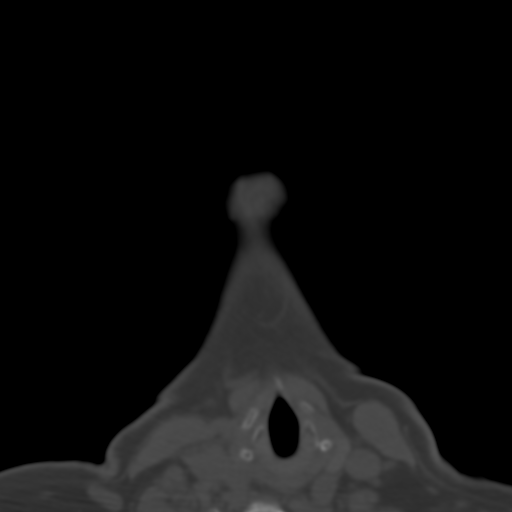
[im 20/114  bone]
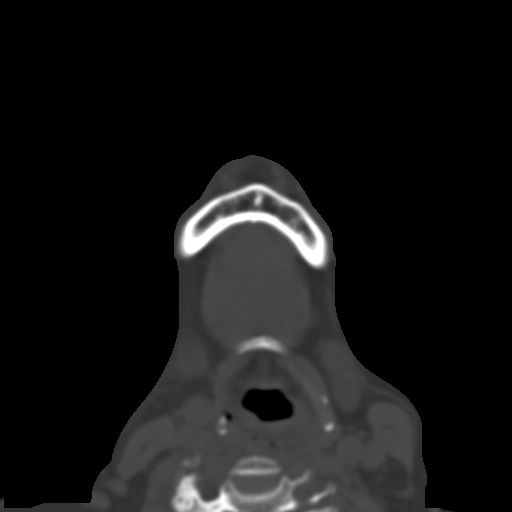
[im 32/114  bone]
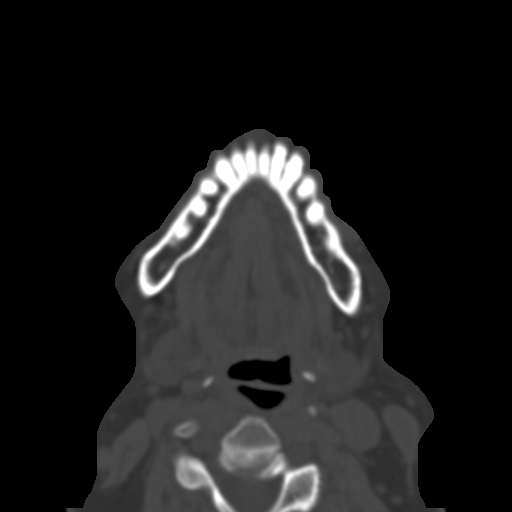
[im 39/114  bone]
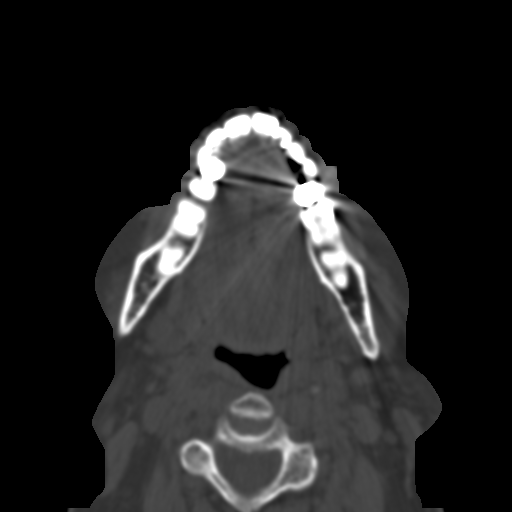
[im 51/114  brain]
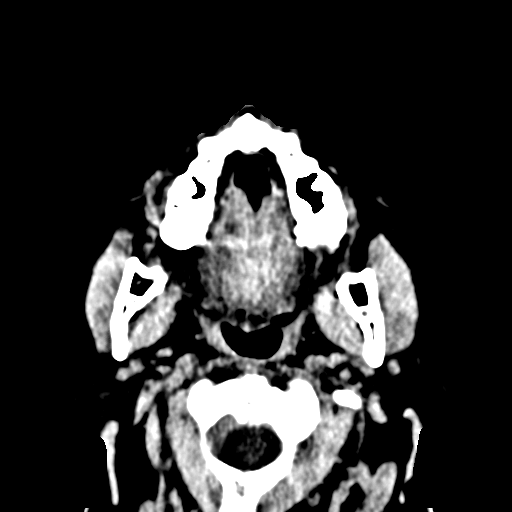
[im 51/114  bone]
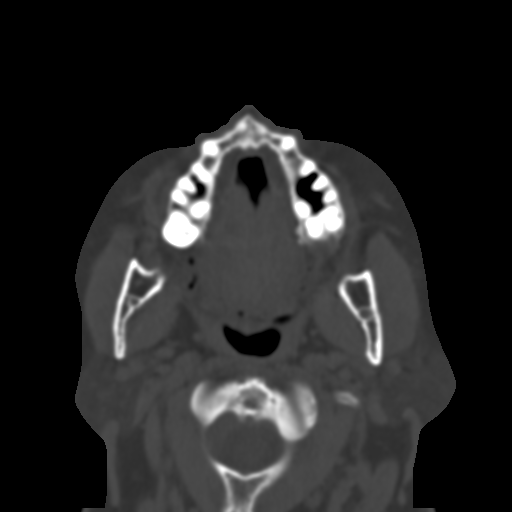
[im 63/114  bone]
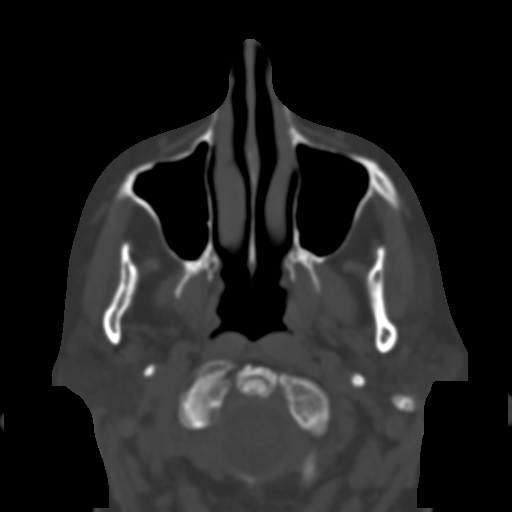
[im 75/114  bone]
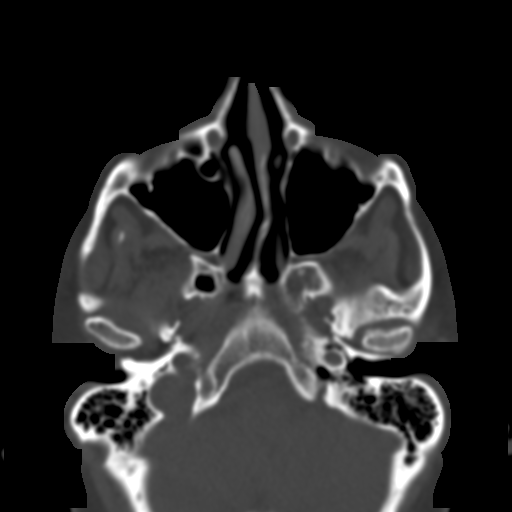
[im 86/114  bone]
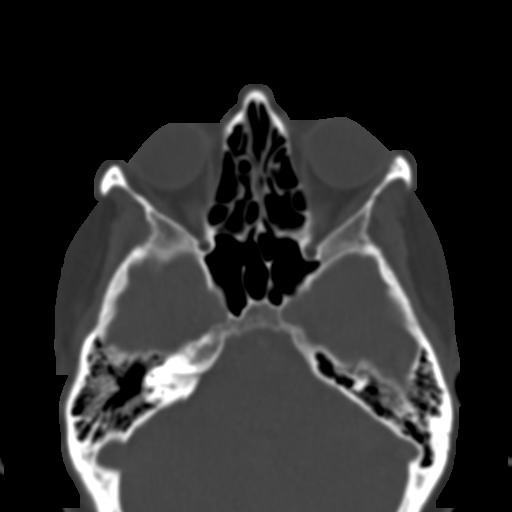
[im 94/114  brain]
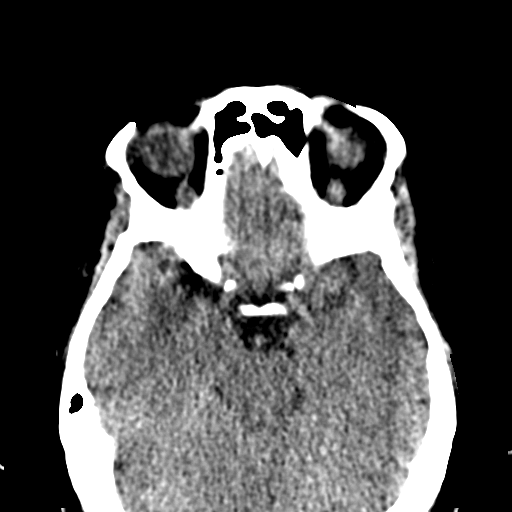
[im 94/114  bone]
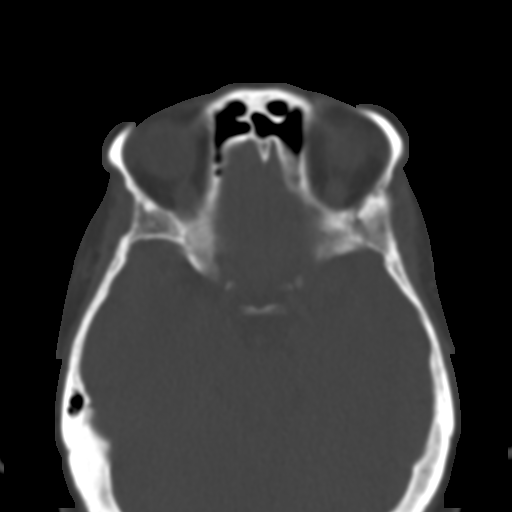
[im 106/114  bone]
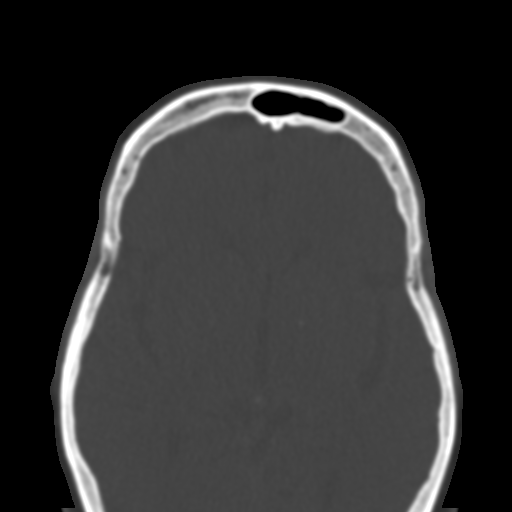

[Series 603: sagittal soft tissue · sagittal · 0.33mm/px · 3 of 106 slices shown]
[im 36/106  bone]
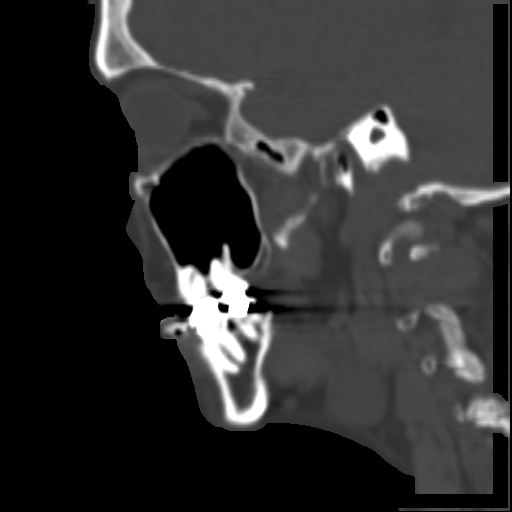
[im 53/106  bone]
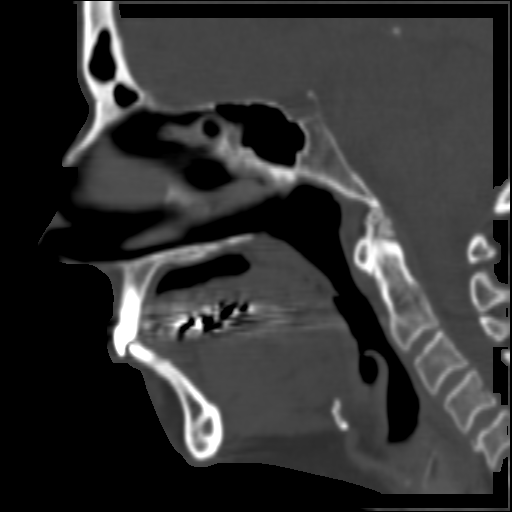
[im 71/106  bone]
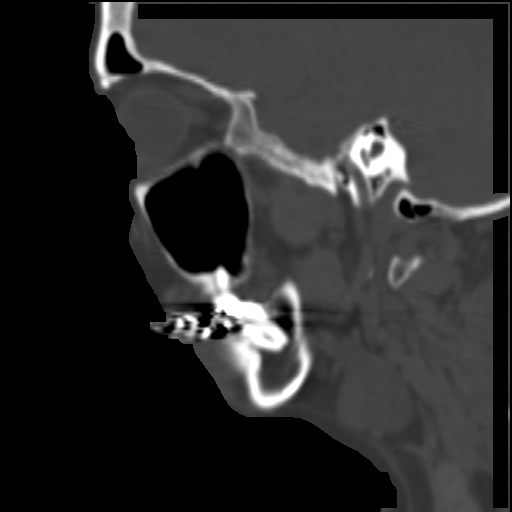

[Series 604: coronal bone · coronal · 0.33mm/px · 3 of 112 slices shown]
[im 28/112  bone]
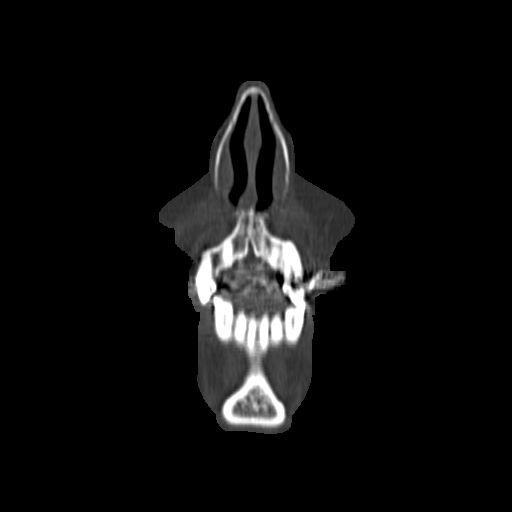
[im 56/112  bone]
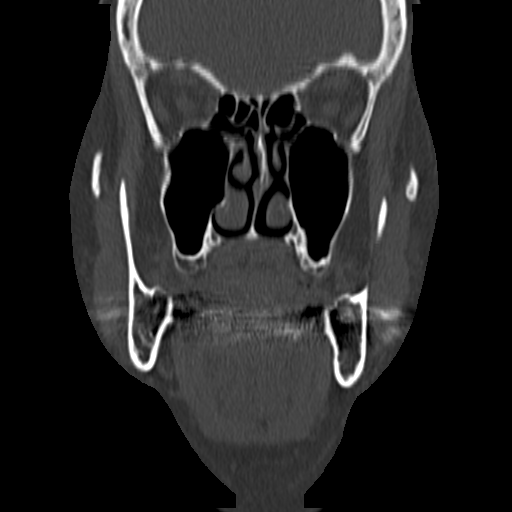
[im 84/112  bone]
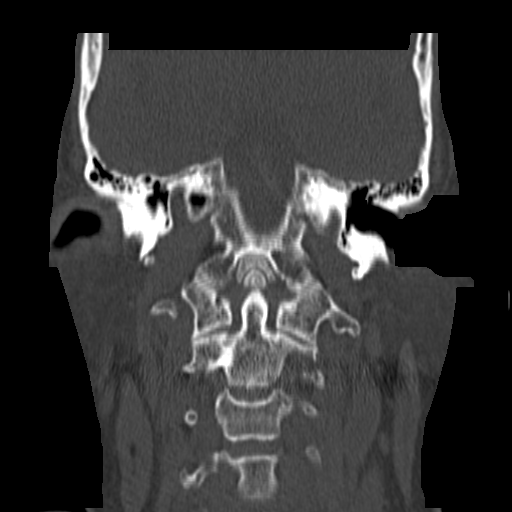

[16 of 47 positions shown; findings below may reference images not displayed]

FINDINGS: CT HEAD FINDINGS

No evidence of parenchymal hemorrhage or extra-axial fluid
collection. No mass lesion, mass effect, or midline shift.

No CT evidence of acute infarction.

Mild subcortical white matter and periventricular small vessel
ischemic changes.

Cerebral volume is within normal limits.  No ventriculomegaly.

The visualized paranasal sinuses are essentially clear. The mastoid
air cells are unopacified.

No evidence of calvarial fracture.

CT MAXILLOFACIAL FINDINGS

No evidence of maxillofacial fracture.

The bilateral orbits, including the globes and retroconal soft
tissues, are within normal limits.

The visualized paranasal sinuses are essentially clear. The mastoid
air cells are unopacified.

The mandible is intact. The bilateral mandibular condyles are
well-seated in the TMJs.

The cervical spine is intact to C5-6.
IMPRESSION: No evidence of acute intracranial abnormality. Mild small vessel
ischemic changes.

Normal maxillofacial CT.  Specifically, the mandible is intact.

Cervical spine is intact to C5-6.

## 2015-12-26 IMAGING — CR DG CERVICAL SPINE 2 OR 3 VIEWS
5 series · 5 of 5 positions shown · non-contrast
Comparison: Cervical spine radiographs performed [DATE]

CLINICAL DATA: Acute onset of syncope. Injury to chin, with
bilateral jaw pain. Concern for neck injury. Initial encounter.

EXAM:
CERVICAL SPINE - 2-3 VIEW

[c-spine ap (1 of 2)]
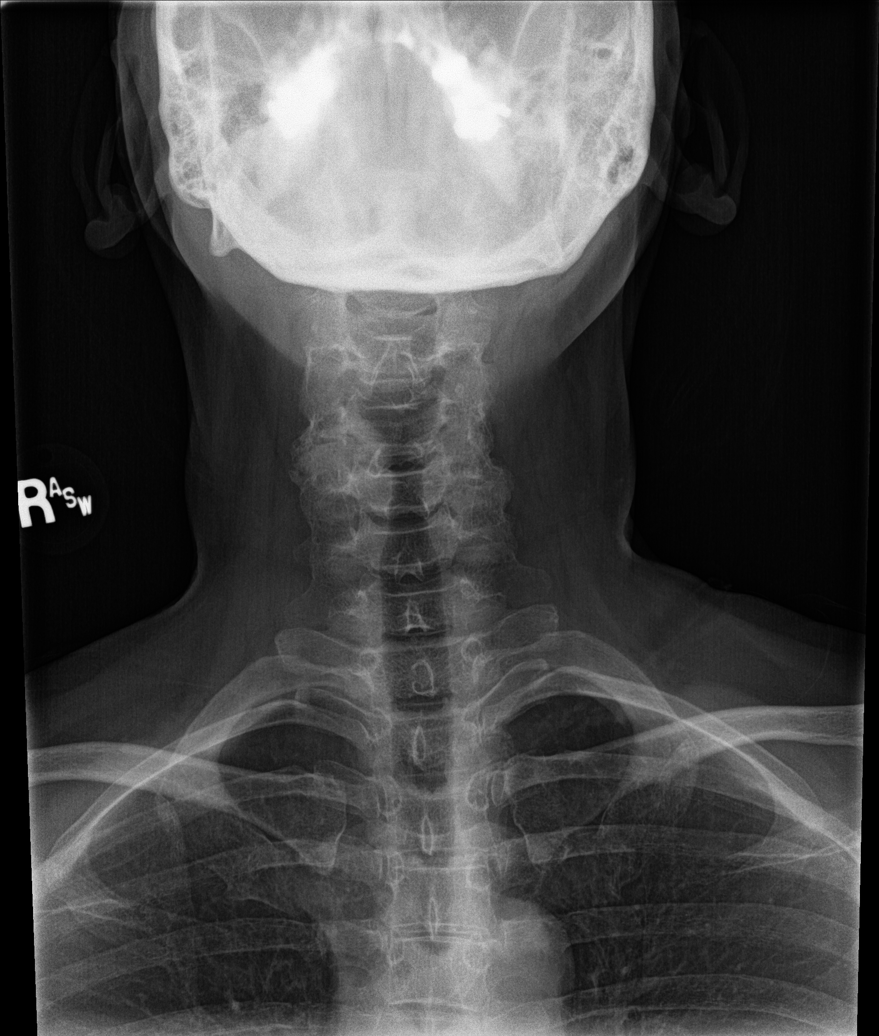

[c-spine lat]
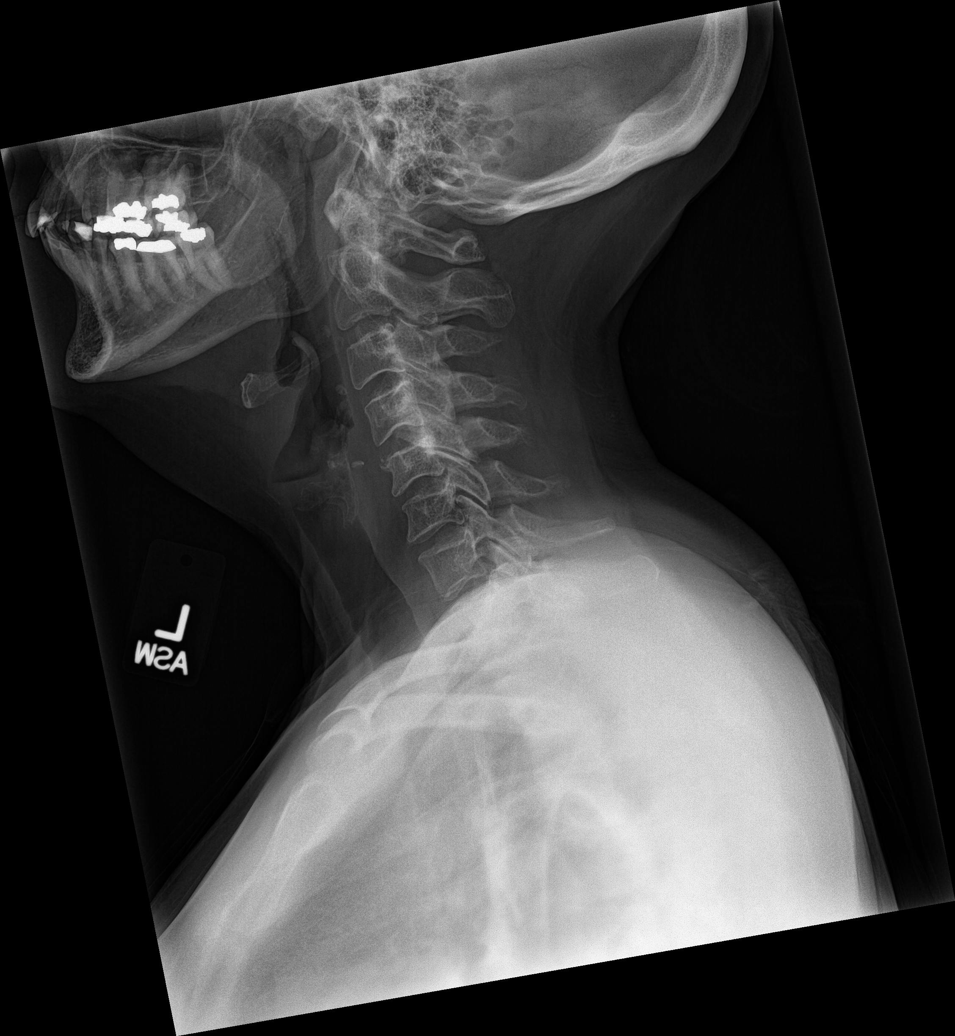

[ct-spine swimmers]
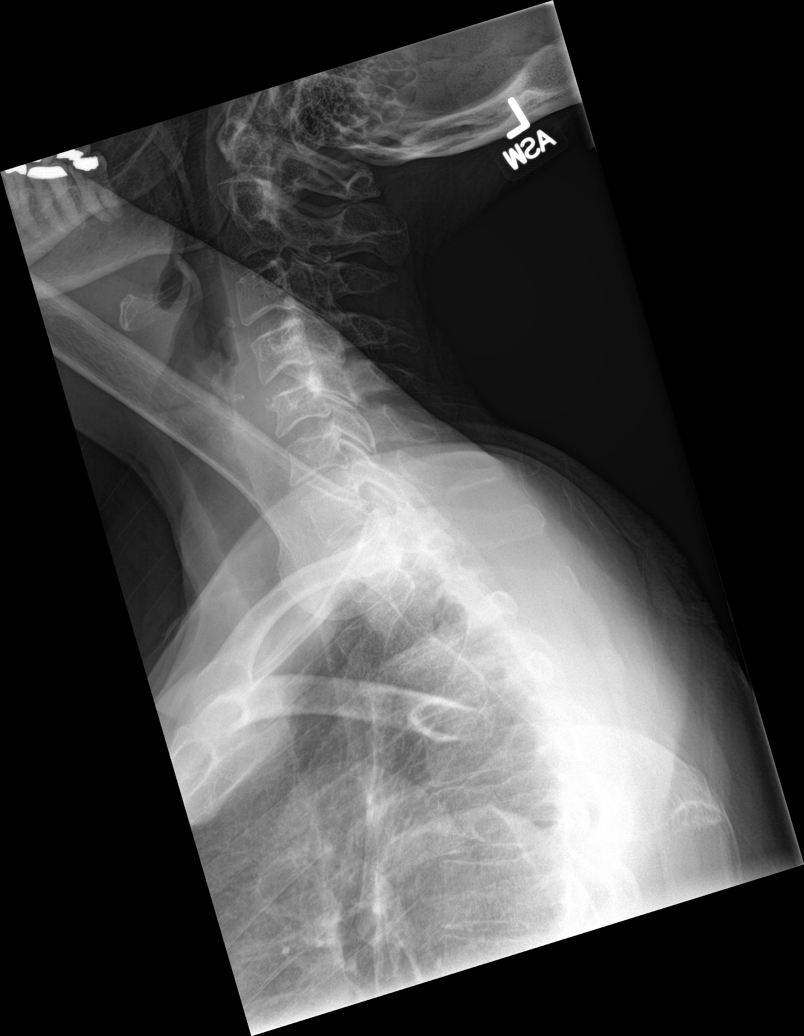

[c-spine open mouth]
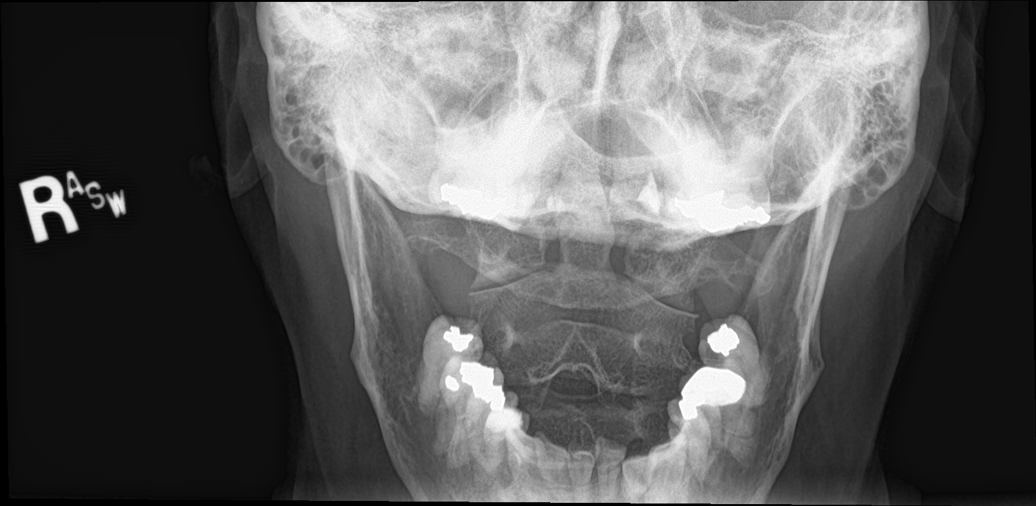

[c-spine ap (2 of 2)]
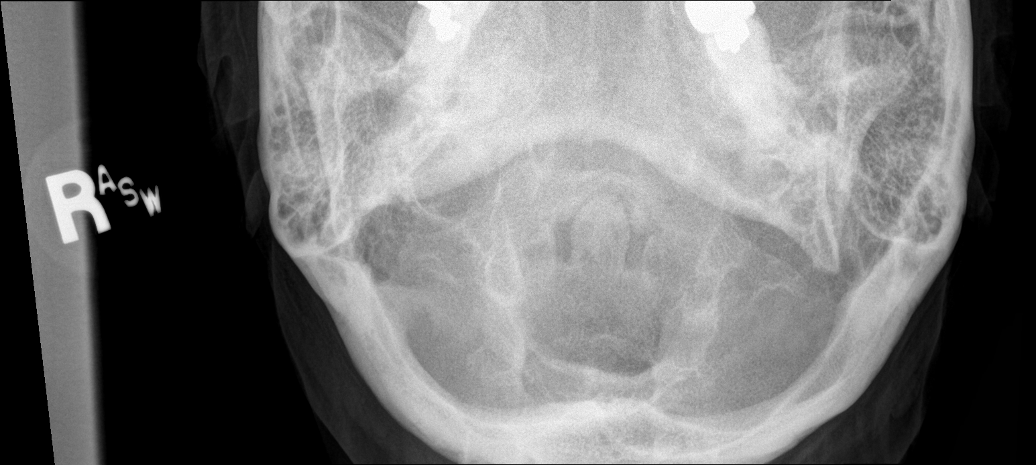

[5 of 5 positions shown; findings below may reference images not displayed]

FINDINGS: There is no evidence of fracture or subluxation. Vertebral bodies
demonstrate normal height and alignment. Intervertebral disc spaces
are preserved. Prevertebral soft tissues are within normal limits.
The provided odontoid view demonstrates no significant abnormality.

The visualized lung apices are clear.
IMPRESSION: No evidence of fracture or subluxation along the cervical spine.

## 2015-12-26 NOTE — ED Notes (Addendum)
Recheck bronchitis and fluid in ears. [place] beach. Injury to chin.

## 2015-12-26 NOTE — Discharge Instructions (Signed)
Push fluids.  Eat regularly, something with protein during the day.  Try to sleep regularly.  Syncope Syncope means a person passes out (faints). The person usually wakes up in less than 5 minutes. It is important to seek medical care for syncope. HOME CARE  Have someone stay with you until you feel normal.  Do not drive, use machines, or play sports until your doctor says it is okay.  Keep all doctor visits as told.  Lie down when you feel like you might pass out. Take deep breaths. Wait until you feel normal before standing up.  Drink enough fluids to keep your pee (urine) clear or pale yellow.  If you take blood pressure or heart medicine, get up slowly. Take several minutes to sit and then stand. GET HELP RIGHT AWAY IF:   You have a severe headache.  You have pain in the chest, belly (abdomen), or back.  You are bleeding from the mouth or butt (rectum).  You have black or tarry poop (stool).  You have an irregular or very fast heartbeat.  You have pain with breathing.  You keep passing out, or you have shaking (seizures) when you pass out.  You pass out when sitting or lying down.  You feel confused.  You have trouble walking.  You have severe weakness.  You have vision problems. If you fainted, call for help (911 in [place] Do not drive yourself to the hospital.   This information is not intended to replace advice given to you by your health care provider. Make sure you discuss any questions you have with your health care provider.   Document Released: 01/01/2008 Document Revised: 11/29/2014 Document Reviewed: 09/13/2011 Elsevier Interactive Patient Education [employer].

## 2015-12-26 NOTE — ED Provider Notes (Signed)
CSN: [id number]     Arrival date & time 12/26/15  1812 History   First MD Initiated Contact with Patient 12/26/15 2000     Chief Complaint  Patient presents with  . Bronchitis  . Loss of Consciousness   HPI 65 yo with UC visit 12/16/15 for bronchitis, upper respiratory sx's, tx'ed with augmentin/prednisone.  5/26 drove to [facility], up late the night before, hadn't really eaten anything that day, felt dizzy when she got out of the car.  "[place]." Passed out on the way into a restaurant, fell forward striking chin on pavement. Declined transport to ED. Seen at acute care clinic later in the evening for persistent bleeding of chin wound.  Declined suturing due to hx of keloid scarring, wound approximated with surgical glue/steri strips.   No unusual neck pain.  Chin/jaw are swollen, painful.  Not bleeding/draining.  Faint erythema in band-aid distribution.   Not coughing.  No fever.  Overall, upper respiratory symptoms improved, just not quite resolved.  Last dose prednisone 5/25.  Last dose augmentin this am.   Expresses concern about persistent ear congestion noted at acute care visit 5/26.    Past Medical History  Diagnosis Date  . Arthritis   . Chicken pox   . Rheumatic fever   . MRSA exposure 2005    Spider bite  . Cholecystitis 11/2011    Did not require sgy - Dr. Staci Acosta - [facility]  (cholelithiasis)  . MVP (mitral valve prolapse)     Stable - Dr. Ubaldo Glassing  . Hypertension   . IBS (irritable bowel syndrome)   . H/O Clostridium difficile infection    Past Surgical History  Procedure Laterality Date  . Muscle biopsy    . Cataract extraction  [id number]   Family History  Problem Relation Age of Onset  . Arthritis Mother   . Stroke Mother   . Diabetes Mother   . Hypertension Mother   . Arthritis Father   . Stroke Father   . Diabetes Paternal Grandmother   . Cancer Paternal Uncle     colon  . Breast cancer Maternal Aunt   . Heart disease      maternal and paternal side    Social History  Substance Use Topics  . Smoking status: Never Smoker   . Smokeless tobacco: Never Used  . Alcohol Use: 0.0 oz/week    0 Standard drinks or equivalent per week     Comment: Rarely    Review of Systems  All other systems reviewed and are negative.   Allergies  Adhesive and Yellow dyes (non-tartrazine)  Home Medications   Prior to Admission medications   Medication Sig Start Date End Date Taking? Authorizing Provider  albuterol (PROVENTIL HFA;VENTOLIN HFA) 108 (90 Base) MCG/ACT inhaler Inhale 2 puffs into the lungs every 6 (six) hours as needed for wheezing or shortness of breath. 12/16/15 12/22/15  Olen Cordial, NP  amoxicillin-clavulanate (AUGMENTIN) 875-125 MG tablet Take 1 tablet by mouth every 12 (twelve) hours. 12/16/15   Olen Cordial, NP  azithromycin (ZITHROMAX) 250 MG tablet Take 1 tablet (250 mg total) by mouth daily. Take first 2 tablets together, then 1 every day until finished. 12/20/15   Olen Cordial, NP  benzonatate (TESSALON PERLES) 100 MG capsule Take 1 capsule (100 mg total) by mouth 3 (three) times daily as needed for cough. 09/27/15   Marylene Land, NP  chlorpheniramine-HYDROcodone [facility] PENNKINETIC ER) 10-8 MG/5ML SUER Take 5 mLs by mouth at bedtime as  needed for cough (do not drive or operate machinery while taking as can cause drowsiness). 09/27/15   Marylene Land, NP  fluticasone (FLONASE) 50 MCG/ACT nasal spray Place 1 spray into both nostrils 2 (two) times daily. 12/16/15 12/30/15  Olen Cordial, NP  sodium chloride (OCEAN) 0.65 % SOLN nasal spray Place 2 sprays into both nostrils every 2 (two) hours while awake. 12/16/15   Aura Fey Betancourt, NP      BP 121/66 mmHg  Pulse 92  Temp(Src) 97.9 F (36.6 C) (Tympanic)  Resp 16  Ht 5\' 7"  (1.702 m)  Wt 183 lb (83.008 kg)  BMI 28.66 kg/m2  SpO2 98% Physical Exam  Constitutional: She is oriented to person, place, and time. No distress.  Alert, nicely groomed  HENT:  Head:  Atraumatic.    B TMs slightly dull, no erythema.   Mild-mod nasal congestion. Throat pink. Hematoma/laceration over point of chin, 1x1.5".  Not draining.  No focal tenderness/warmth.  Faintly red in bandaid distribution.    Eyes:  Conjugate gaze, no eye redness/drainage  Neck: Neck supple.  Cardiovascular: Normal rate and regular rhythm.   Pulmonary/Chest: No respiratory distress.  Abdominal: She exhibits no distension.  Musculoskeletal: Normal range of motion.  No leg swelling  Neurological: She is alert and oriented to person, place, and time.  Able to walk into urgent care independently.  Skin: Skin is warm and dry. No rash noted.  No cyanosis  Nursing note and vitals reviewed.   ED Course  Procedures (including critical care time)   Imaging Review Dg Cervical Spine 2-3 Views  12/26/2015  CLINICAL DATA:  Acute onset of syncope. Injury to chin, with bilateral jaw pain. Concern for neck injury. Initial encounter. EXAM: CERVICAL SPINE - 2-3 VIEW COMPARISON:  Cervical spine radiographs performed 11/25/2013 FINDINGS: There is no evidence of fracture or subluxation. Vertebral bodies demonstrate normal height and alignment. Intervertebral disc spaces are preserved. Prevertebral soft tissues are within normal limits. The provided odontoid view demonstrates no significant abnormality. The visualized lung apices are clear. IMPRESSION: No evidence of fracture or subluxation along the cervical spine. Electronically Signed   By: Garald Balding M.D.   On: 12/26/2015 21:19   Ct Head Wo Contrast  12/26/2015  CLINICAL DATA:  Passed out 4 days ago, bilateral jaw/chin pain EXAM: CT HEAD WITHOUT CONTRAST CT MAXILLOFACIAL WITHOUT CONTRAST TECHNIQUE: Multidetector CT imaging of the head and maxillofacial structures were performed using the standard protocol without intravenous contrast. Multiplanar CT image reconstructions of the maxillofacial structures were also generated. COMPARISON:  None. FINDINGS:  CT HEAD FINDINGS No evidence of parenchymal hemorrhage or extra-axial fluid collection. No mass lesion, mass effect, or midline shift. No CT evidence of acute infarction. Mild subcortical white matter and periventricular small vessel ischemic changes. Cerebral volume is within normal limits.  No ventriculomegaly. The visualized paranasal sinuses are essentially clear. The mastoid air cells are unopacified. No evidence of calvarial fracture. CT MAXILLOFACIAL FINDINGS No evidence of maxillofacial fracture. The bilateral orbits, including the globes and retroconal soft tissues, are within normal limits. The visualized paranasal sinuses are essentially clear. The mastoid air cells are unopacified. The mandible is intact. The bilateral mandibular condyles are well-seated in the TMJs. The cervical spine is intact to C5-6. IMPRESSION: No evidence of acute intracranial abnormality. Mild small vessel ischemic changes. Normal maxillofacial CT.  Specifically, the mandible is intact. Cervical spine is intact to C5-6. Electronically Signed   By: Julian Hy M.D.   On: 12/26/2015 21:25  Ct Maxillofacial Wo Cm  12/26/2015  CLINICAL DATA:  Passed out 4 days ago, bilateral jaw/chin pain EXAM: CT HEAD WITHOUT CONTRAST CT MAXILLOFACIAL WITHOUT CONTRAST TECHNIQUE: Multidetector CT imaging of the head and maxillofacial structures were performed using the standard protocol without intravenous contrast. Multiplanar CT image reconstructions of the maxillofacial structures were also generated. COMPARISON:  None. FINDINGS: CT HEAD FINDINGS No evidence of parenchymal hemorrhage or extra-axial fluid collection. No mass lesion, mass effect, or midline shift. No CT evidence of acute infarction. Mild subcortical white matter and periventricular small vessel ischemic changes. Cerebral volume is within normal limits.  No ventriculomegaly. The visualized paranasal sinuses are essentially clear. The mastoid air cells are unopacified. No  evidence of calvarial fracture. CT MAXILLOFACIAL FINDINGS No evidence of maxillofacial fracture. The bilateral orbits, including the globes and retroconal soft tissues, are within normal limits. The visualized paranasal sinuses are essentially clear. The mastoid air cells are unopacified. The mandible is intact. The bilateral mandibular condyles are well-seated in the TMJs. The cervical spine is intact to C5-6. IMPRESSION: No evidence of acute intracranial abnormality. Mild small vessel ischemic changes. Normal maxillofacial CT.  Specifically, the mandible is intact. Cervical spine is intact to C5-6. Electronically Signed   By: Julian Hy M.D.   On: 12/26/2015 21:25     MDM   1. Syncope, unspecified syncope type   2. Hematoma and contusion    Orthostatics with rise in HR from 69 to 92 and sbp drop from 129 to 121, suggesting possible mild dehydration.  Push fluids.  Eat and sleep regularly.  Followup with Dr Nicki Reaper to discuss further evaluation for syncope.  Recheck for increasing redness/swelling/pain/drainage from chin, or new fever >100.5.    Sherlene Shams, MD 12/26/15 2209

## 2016-01-02 ENCOUNTER — Telehealth: Payer: Self-pay | Admitting: *Deleted

## 2016-01-02 NOTE — Telephone Encounter (Signed)
Please advise 

## 2016-01-02 NOTE — Telephone Encounter (Signed)
See below , thanks

## 2016-01-02 NOTE — Telephone Encounter (Signed)
Patient questioned if coconut oil would help keep her triglycerides low. [phone]

## 2016-01-02 NOTE — Telephone Encounter (Signed)
No studies to show that taking coconut oil by itself will lower triglycerides.

## 2016-01-02 NOTE — Telephone Encounter (Signed)
Spoke with the patient, reviewed your message with her,   FYI: She wanted to let you know that some things have been going on,   Bronchitis since march, and [place] out at the beach last weekend, Saturations were low 94%, hasn't been coughing a ton, thought she was better, come to find out her HR was low also.  Purchased a   Pulse Oximetry from the store, Heart rate has been low, on the monitor (55-60) BP was fine in the 123456 systolically.  Trying to keep a eye on things to manage things better since she hasn't been that well.   Been going to the urgent care in [place] for appts and f/u- you can view her history there. Steri strips on her chin, from the fall.    She would like a OV to follow up with you, she was told a few weeks ago it wouldn't be until august, can you see her sooner?

## 2016-01-04 ENCOUNTER — Ambulatory Visit
Admission: EM | Admit: 2016-01-04 | Discharge: 2016-01-04 | Disposition: A | Discharge status: Home / Self Care | Payer: BLUE CROSS/BLUE SHIELD | Attending: Family Medicine | Admitting: Family Medicine

## 2016-01-04 DIAGNOSIS — R42 Dizziness and giddiness: Secondary | ICD-10-CM | POA: Diagnosis not present

## 2016-01-04 DIAGNOSIS — R55 Syncope and collapse: Secondary | ICD-10-CM | POA: Insufficient documentation

## 2016-01-04 NOTE — Telephone Encounter (Signed)
Attempted to call the patient but unable to reach the patient due to the mailbox being full.  Will try to contact the patient at another time.

## 2016-01-04 NOTE — Discharge Instructions (Signed)
Cardiac Event Monitoring A cardiac event monitor is a small recording device used to help detect abnormal heart rhythms (arrhythmias). The monitor is used to record heart rhythm when noticeable symptoms such as the following occur:  Fast heartbeats (palpitations), such as heart racing or fluttering.  Dizziness.  Fainting or light-headedness.  Unexplained weakness. The monitor is wired to two electrodes placed on your chest. Electrodes are flat, sticky disks that attach to your skin. The monitor can be worn for up to 30 days. You will wear the monitor at all times, except when bathing.  HOW TO USE YOUR CARDIAC EVENT MONITOR A technician will prepare your chest for the electrode placement. The technician will show you how to place the electrodes, how to work the monitor, and how to replace the batteries. Take time to practice using the monitor before you leave the office. Make sure you understand how to send the information from the monitor to your health care provider. This requires a telephone with a landline, not a cell phone. You need to:  Wear your monitor at all times, except when you are in water:  Do not get the monitor wet.  Take the monitor off when bathing. Do not swim or use a hot tub with it on.  Keep your skin clean. Do not put body lotion or moisturizer on your chest.  Change the electrodes daily or any time they stop sticking to your skin. You might need to use tape to keep them on.  It is possible that your skin under the electrodes could become irritated. To keep this from happening, try to put the electrodes in slightly different places on your chest. However, they must remain in the area under your left breast and in the upper right section of your chest.  Make sure the monitor is safely clipped to your clothing or in a location close to your body that your health care provider recommends.  Press the button to record when you feel symptoms of heart trouble, such as  dizziness, weakness, light-headedness, palpitations, thumping, shortness of breath, unexplained weakness, or a fluttering or racing heart. The monitor is always on and records what happened slightly before you pressed the button, so do not worry about being too late to get good information.  Keep a diary of your activities, such as walking, doing chores, and taking medicine. It is especially important to note what you were doing when you pushed the button to record your symptoms. This will help your health care provider determine what might be contributing to your symptoms. The information stored in your monitor will be reviewed by your health care provider alongside your diary entries.  Send the recorded information as recommended by your health care provider. It is important to understand that it will take some time for your health care provider to process the results.  Change the batteries as recommended by your health care provider. SEEK IMMEDIATE MEDICAL CARE IF:   You have chest pain.  You have extreme difficulty breathing or shortness of breath.  You develop a very fast heartbeat that persists.  You develop dizziness that does not go away.  You faint or constantly feel you are about to faint.   This information is not intended to replace advice given to you by your health care provider. Make sure you discuss any questions you have with your health care provider.   Document Released: 04/23/2008 Document Revised: 08/05/2014 Document Reviewed: 01/11/2013 Elsevier Interactive Patient Education [employer].  Near-Syncope Near-syncope (commonly known as near fainting) is sudden weakness, dizziness, or feeling like you might pass out. During an episode of near-syncope, you may also develop pale skin, have tunnel vision, or feel sick to your stomach (nauseous). Near-syncope may occur when getting up after sitting or while standing for a long time. It is caused by a sudden decrease in  blood flow to the brain. This decrease can result from various causes or triggers, most of which are not serious. However, because near-syncope can sometimes be a sign of something serious, a medical evaluation is required. The specific cause is often not determined. HOME CARE INSTRUCTIONS  Monitor your condition for any changes. The following actions may help to alleviate any discomfort you are experiencing:  Have someone stay with you until you feel stable.  Lie down right away and prop your feet up if you start feeling like you might faint. Breathe deeply and steadily. Wait until all the symptoms have passed. Most of these episodes last only a few minutes. You may feel tired for several hours.   Drink enough fluids to keep your urine clear or pale yellow.   If you are taking blood pressure or heart medicine, get up slowly when seated or lying down. Take several minutes to sit and then stand. This can reduce dizziness.  Follow up with your health care provider as directed. SEEK IMMEDIATE MEDICAL CARE IF:   You have a severe headache.   You have unusual pain in the chest, abdomen, or back.   You are bleeding from the mouth or rectum, or you have black or tarry stool.   You have an irregular or very fast heartbeat.   You have repeated fainting or have seizure-like jerking during an episode.   You faint when sitting or lying down.   You have confusion.   You have difficulty walking.   You have severe weakness.   You have vision problems.  MAKE SURE YOU:   Understand these instructions.  Will watch your condition.  Will get help right away if you are not doing well or get worse.   This information is not intended to replace advice given to you by your health care provider. Make sure you discuss any questions you have with your health care provider.   Document Released: 07/15/2005 Document Revised: 07/20/2013 Document Reviewed: 12/18/2012 Elsevier Interactive  Patient Education [employer].

## 2016-01-04 NOTE — ED Provider Notes (Signed)
CSN: [id number]     Arrival date & time 01/04/16  1429 History   First MD Initiated Contact with Patient 01/04/16 1557     Chief Complaint  Patient presents with  . Dizziness    Pt reports awoke at 0400 with nausea and dizziness. Has had one episode of vomiting today. Reports she felt "sea sick" but feels somewhat better. Recently seen here for bronchitis, and then subsquent syncope and had extensive work up.    (Consider location/radiation/quality/duration/timing/severity/associated sxs/prior Treatment) HPI  Is a 65 year old female who returns today after initially being seen here on 12/16/2015 with a bronchitis. She states that she is syncopal episode the following week from which she quickly recovered. However she did hurt her chin at that time and had glue placed at that time and it is healing nicely. Workup for the syncopal episode was performed at that time. States that today at 4:00 she awakened for a bathroom trip and she became nauseated and dizzy. She vomited one time. She had dizziness that was worse on the right side. Visit after she was able to eat breakfast she felt much better. She has a history of MVP that was evaluated by Dr. Ubaldo Glassing. Last seen him for 2-3 years. She denies any chest pain.     Past Medical History  Diagnosis Date  . Arthritis   . Chicken pox   . Rheumatic fever   . MRSA exposure 2005    Spider bite  . Cholecystitis 11/2011    Did not require sgy - Dr. Staci Acosta - [facility]  (cholelithiasis)  . MVP (mitral valve prolapse)     Stable - Dr. Ubaldo Glassing  . Hypertension   . IBS (irritable bowel syndrome)   . H/O Clostridium difficile infection    Past Surgical History  Procedure Laterality Date  . Muscle biopsy    . Cataract extraction  [id number]   Family History  Problem Relation Age of Onset  . Arthritis Mother   . Stroke Mother   . Diabetes Mother   . Hypertension Mother   . Arthritis Father   . Stroke Father   . Diabetes Paternal Grandmother   . Cancer  Paternal Uncle     colon  . Breast cancer Maternal Aunt   . Heart disease      maternal and paternal side   Social History  Substance Use Topics  . Smoking status: Never Smoker   . Smokeless tobacco: Never Used  . Alcohol Use: 0.0 oz/week    0 Standard drinks or equivalent per week     Comment: Rarely   OB History    No data available     Review of Systems  Constitutional: Negative for fever, chills, activity change and fatigue.  Gastrointestinal: Positive for nausea and vomiting.  Neurological: Positive for dizziness and weakness.  All other systems reviewed and are negative.   Allergies  Adhesive and Yellow dyes (non-tartrazine)  Home Medications   Prior to Admission medications   Medication Sig Start Date End Date Taking? Authorizing Provider  albuterol (PROVENTIL HFA;VENTOLIN HFA) 108 (90 Base) MCG/ACT inhaler Inhale 2 puffs into the lungs every 6 (six) hours as needed for wheezing or shortness of breath. 12/16/15 12/22/15  Olen Cordial, NP  amoxicillin-clavulanate (AUGMENTIN) 875-125 MG tablet Take 1 tablet by mouth every 12 (twelve) hours. 12/16/15   Olen Cordial, NP  azithromycin (ZITHROMAX) 250 MG tablet Take 1 tablet (250 mg total) by mouth daily. Take first 2 tablets together, then 1  every day until finished. 12/20/15   Olen Cordial, NP  benzonatate (TESSALON PERLES) 100 MG capsule Take 1 capsule (100 mg total) by mouth 3 (three) times daily as needed for cough. 09/27/15   Marylene Land, NP  chlorpheniramine-HYDROcodone [facility] PENNKINETIC ER) 10-8 MG/5ML SUER Take 5 mLs by mouth at bedtime as needed for cough (do not drive or operate machinery while taking as can cause drowsiness). 09/27/15   Marylene Land, NP  fluticasone (FLONASE) 50 MCG/ACT nasal spray Place 1 spray into both nostrils 2 (two) times daily. 12/16/15 12/30/15  Olen Cordial, NP  sodium chloride (OCEAN) 0.65 % SOLN nasal spray Place 2 sprays into both nostrils every 2 (two) hours while  awake. 12/16/15   Olen Cordial, NP   Meds Ordered and Administered this Visit  Medications - No data to display  BP 117/56 mmHg  Pulse 86  Temp(Src) 98.2 F (36.8 C) (Oral)  Resp 20  Ht 5\' 8"  (1.727 m)  Wt 189 lb (85.73 kg)  BMI 28.74 kg/m2  SpO2 98% Orthostatic VS for the past 24 hrs:  BP- Lying Pulse- Lying BP- Sitting Pulse- Sitting BP- Standing at 0 minutes Pulse- Standing at 0 minutes  01/04/16 1721 121/65 mmHg 66 124/67 mmHg 78 110/60 mmHg 80    Physical Exam  Constitutional: She is oriented to person, place, and time. She appears well-developed and well-nourished. No distress.  HENT:  Head: Normocephalic and atraumatic.  Nose: Nose normal.  Mouth/Throat: Oropharynx is clear and moist. No oropharyngeal exudate.   TMs are dull bilaterally.  Eyes: Conjunctivae are normal. Pupils are equal, round, and reactive to light.  Neck: Normal range of motion. Neck supple.  Cardiovascular: Normal rate, regular rhythm and normal heart sounds.  Exam reveals no gallop and no friction rub.   No murmur heard. Pulmonary/Chest: Effort normal and breath sounds normal.  Musculoskeletal: Normal range of motion. She exhibits no edema or tenderness.  Lymphadenopathy:    She has no cervical adenopathy.  Neurological: She is alert and oriented to person, place, and time.  Skin: Skin is warm and dry. She is not diaphoretic.  Psychiatric: She has a normal mood and affect. Her behavior is normal. Judgment and thought content normal.  Nursing note and vitals reviewed.   ED Course  Procedures (including critical care time)  Labs Review Labs Reviewed - No data to display  Imaging Review No results found.   Visual Acuity Review  Right Eye Distance:   Left Eye Distance:   Bilateral Distance:    Right Eye Near:   Left Eye Near:    Bilateral Near:    ED ECG REPORT   Date: 01/04/2016  EKG Time: 5:59 PM  Rate: 66  Rhythm: normal sinus rhythm,  normal EKG, Axis:Normal Intervals:none   ST&T Change No acute changes Narrative Interpretation: NSR without acute changes              MDM   1. Syncope, unspecified syncope type   2. Near syncope    New Prescriptions   No medications on file  Plan: 1. Test/x-ray results and diagnosis reviewed with patient 2. rx as per orders; risks, benefits, potential side effects reviewed with patient 3. Recommend supportive treatment with Increased fluid. Because of her previous witnessed syncopal episode and her episode last night with the dizziness and nausea which may have represented a near syncopal episode as well as her varying pulse rate per her pulse oximeter she may have a cardiac arrhythmia or  neurocardiogenic syncope that is accounting for her symptoms. Since she has already seen Dr. Ubaldo Glassing in the past I have highly recommended that she make an appointment to see him in the very near future. She may benefit from a Holter monitor. I also recommended that she follow-up with Dr. Nicki Reaper her primary care physician for evaluation. At the present time besides her maintaining hydration no medications are indicated. 4. F/u prn if symptoms worsen or don't improve     Lorin Picket, PA-C 01/04/16 1759

## 2016-01-04 NOTE — Telephone Encounter (Signed)
Please confirm with pt that she is feeling better and not acute problems at this time.  If so, then forward to [place] to work in.  Hold for work in appt.  I will get with her to find a spot for her.  Thanks

## 2016-01-05 NOTE — Telephone Encounter (Signed)
Spoke with patient and she went to Urgent Care in [facility].  She is feeling better today and has scheduled an appointment with Dr Ubaldo Glassing? Cardiologist June 14,2017.

## 2016-01-08 NOTE — Telephone Encounter (Signed)
Since she is seeing cardiology this week on 01/10/16, I can see her on 01/23/16 at 11:30.   (I am not here next week).  If she feels she needs earlier appt with me than 01/23/16 - let me know.  If she is ok with 6//227/17 appt than please place on schedule.  Thanks

## 2016-01-08 NOTE — Telephone Encounter (Signed)
Patient would like to come in 01/23/16 at 11:30.  Please add to Dr Bary Leriche schedule.  Thanks.

## 2016-01-23 ENCOUNTER — Ambulatory Visit (INDEPENDENT_AMBULATORY_CARE_PROVIDER_SITE_OTHER): Payer: BLUE CROSS/BLUE SHIELD | Admitting: Internal Medicine

## 2016-01-23 ENCOUNTER — Encounter: Payer: Self-pay | Admitting: Internal Medicine

## 2016-01-23 ENCOUNTER — Encounter (INDEPENDENT_AMBULATORY_CARE_PROVIDER_SITE_OTHER): Payer: Self-pay

## 2016-01-23 VITALS — BP 120/80 | HR 84 | Temp 98.5°F | Resp 18 | Ht 66.5 in | Wt 194.5 lb

## 2016-01-23 DIAGNOSIS — R5383 Other fatigue: Secondary | ICD-10-CM | POA: Diagnosis not present

## 2016-01-23 DIAGNOSIS — E162 Hypoglycemia, unspecified: Secondary | ICD-10-CM

## 2016-01-23 DIAGNOSIS — R739 Hyperglycemia, unspecified: Secondary | ICD-10-CM

## 2016-01-23 DIAGNOSIS — R221 Localized swelling, mass and lump, neck: Secondary | ICD-10-CM

## 2016-01-23 DIAGNOSIS — E7800 Pure hypercholesterolemia, unspecified: Secondary | ICD-10-CM | POA: Diagnosis not present

## 2016-01-23 DIAGNOSIS — Z1239 Encounter for other screening for malignant neoplasm of breast: Secondary | ICD-10-CM

## 2016-01-23 DIAGNOSIS — R7309 Other abnormal glucose: Secondary | ICD-10-CM

## 2016-01-23 DIAGNOSIS — R799 Abnormal finding of blood chemistry, unspecified: Secondary | ICD-10-CM | POA: Diagnosis not present

## 2016-01-23 LAB — CBC WITH DIFFERENTIAL/PLATELET
Basophils Absolute: 0 10*3/uL (ref 0.0–0.1)
Basophils Relative: 0.5 % (ref 0.0–3.0)
Eosinophils Absolute: 0 10*3/uL (ref 0.0–0.7)
Eosinophils Relative: 0.8 % (ref 0.0–5.0)
HCT: 41.3 % (ref 36.0–46.0)
Hemoglobin: 13.9 g/dL (ref 12.0–15.0)
Lymphocytes Relative: 33.2 % (ref 12.0–46.0)
Lymphs Abs: 1.9 10*3/uL (ref 0.7–4.0)
MCHC: 33.6 g/dL (ref 30.0–36.0)
MCV: 86.3 fl (ref 78.0–100.0)
Monocytes Absolute: 0.4 10*3/uL (ref 0.1–1.0)
Monocytes Relative: 6.2 % (ref 3.0–12.0)
Neutro Abs: 3.4 10*3/uL (ref 1.4–7.7)
Neutrophils Relative %: 59.3 % (ref 43.0–77.0)
Platelets: 291 10*3/uL (ref 150.0–400.0)
RBC: 4.78 Mil/uL (ref 3.87–5.11)
RDW: 13.5 % (ref 11.5–15.5)
WBC: 5.7 10*3/uL (ref 4.0–10.5)

## 2016-01-23 LAB — HEPATIC FUNCTION PANEL
ALT: 16 U/L (ref 0–35)
AST: 20 U/L (ref 0–37)
Albumin: 4.4 g/dL (ref 3.5–5.2)
Alkaline Phosphatase: 64 U/L (ref 39–117)
Bilirubin, Direct: 0.1 mg/dL (ref 0.0–0.3)
Total Bilirubin: 1 mg/dL (ref 0.2–1.2)
Total Protein: 7.4 g/dL (ref 6.0–8.3)

## 2016-01-23 LAB — BASIC METABOLIC PANEL
BUN: 15 mg/dL (ref 6–23)
CO2: 30 mEq/L (ref 19–32)
Calcium: 9.6 mg/dL (ref 8.4–10.5)
Chloride: 105 mEq/L (ref 96–112)
Creatinine, Ser: 0.68 mg/dL (ref 0.40–1.20)
GFR: 92.77 mL/min (ref >60.00)
Glucose, Bld: 86 mg/dL (ref 70–99)
Potassium: 4.5 mEq/L (ref 3.5–5.1)
Sodium: 140 mEq/L (ref 135–145)

## 2016-01-23 LAB — TSH: TSH: 2.39 u[IU]/mL (ref 0.35–4.50)

## 2016-01-23 LAB — GLUCOSE, POCT (MANUAL RESULT ENTRY): POC Glucose: 91 mg/dl (ref 70–99)

## 2016-01-23 NOTE — Progress Notes (Signed)
Pre-visit discussion using our clinic review tool. No additional management support is needed unless otherwise documented below in the visit note.  

## 2016-01-23 NOTE — Progress Notes (Signed)
Patient ID: Lynn Mann, female   DOB: 05/07/1951, 65 y.o.   MRN: [id number]   Subjective:    Patient ID: Lynn Mann, female    DOB: 1951-07-29, 64 y.o.   MRN: [id number]  HPI  Patient here for a scheduled follow up.  Was seen in the ER recently.  Was seen initially seen 09/27/15 and diagnosed with bronchitis.  CXR ok.  Placed on zpak.  Symptoms improved. Then on 12/16/15 - presented with fatigue, cough and sob.  She hit her mouth on the car door.  Was given a duoneb.  This caused vomiting.  Was discharged on inhalers, prednisone and augmentin.  On 12/20/15 had upset stomach and congestion.  Placed back on zpak. 12/26/15 to Urgent Care.  Had been to [facility].  Did not eat.  Not much sleep.  Felt sea sick in car.  When got out to go into a restaurant and passed out.  Hit cement.  Steri strips placed.  Chin and jaw swollen.  Swelling better now.  Still has fullness in her neck, but states is better.  Found to have increased heart rate and slightly decreased blood pressure when standing.  See note.  Encouraged to stay hydrated.  Evaluated by Dr Ubaldo Glassing for syncope.  Had holter monitor placed and echo.  Has f/u with Dr Ubaldo Glassing 01/31/16.  She still reports feeling fatigued.  No chest pain.  Some increased heart rate with increased activity.  PR 104 - when washing and folding clothes.  She is eating.  No nausea or vomiting.  Bowels stable.     Past Medical History  Diagnosis Date  . Arthritis   . Chicken pox   . Rheumatic fever   . MRSA exposure 2005    Spider bite  . Cholecystitis 11/2011    Did not require sgy - Dr. Staci Acosta - [facility]  (cholelithiasis)  . MVP (mitral valve prolapse)     Stable - Dr. Ubaldo Glassing  . Hypertension   . IBS (irritable bowel syndrome)   . H/O Clostridium difficile infection    Past Surgical History  Procedure Laterality Date  . Muscle biopsy    . Cataract extraction  [id number]   Family History  Problem Relation Age of Onset  . Arthritis Mother   . Stroke Mother   .  Diabetes Mother   . Hypertension Mother   . Arthritis Father   . Stroke Father   . Diabetes Paternal Grandmother   . Cancer Paternal Uncle     colon  . Breast cancer Maternal Aunt   . Heart disease      maternal and paternal side   Social History   Social History  . Marital Status: Married    Spouse Name: N/A  . Number of Children: N/A  . Years of Education: 16   Occupational History  . Retired    Social History Main Topics  . Smoking status: Never Smoker   . Smokeless tobacco: Never Used  . Alcohol Use: 0.0 oz/week    0 Standard drinks or equivalent per week     Comment: Rarely  . Drug Use: No  . Sexual Activity:    Partners: Male    Birth Control/ Protection: Post-menopausal   Other Topics Concern  . None   Social History Narrative    Outpatient Encounter Prescriptions as of 01/23/2016  Medication Sig  . fluticasone (FLONASE) 50 MCG/ACT nasal spray Place 1 spray into both nostrils 2 (two) times daily.  . sodium  chloride (OCEAN) 0.65 % SOLN nasal spray Place 2 sprays into both nostrils every 2 (two) hours while awake.  . albuterol (PROVENTIL HFA;VENTOLIN HFA) 108 (90 Base) MCG/ACT inhaler Inhale 2 puffs into the lungs every 6 (six) hours as needed for wheezing or shortness of breath.  . [DISCONTINUED] amoxicillin-clavulanate (AUGMENTIN) 875-125 MG tablet Take 1 tablet by mouth every 12 (twelve) hours.  . [DISCONTINUED] azithromycin (ZITHROMAX) 250 MG tablet Take 1 tablet (250 mg total) by mouth daily. Take first 2 tablets together, then 1 every day until finished.  . [DISCONTINUED] benzonatate (TESSALON PERLES) 100 MG capsule Take 1 capsule (100 mg total) by mouth 3 (three) times daily as needed for cough.  . [DISCONTINUED] chlorpheniramine-HYDROcodone (TUSSIONEX PENNKINETIC ER) 10-8 MG/5ML SUER Take 5 mLs by mouth at bedtime as needed for cough (do not drive or operate machinery while taking as can cause drowsiness).   No facility-administered encounter medications  on file as of 01/23/2016.    Review of Systems  Constitutional: Negative for appetite change and unexpected weight change.  HENT: Negative for congestion and sinus pressure.   Respiratory: Positive for shortness of breath. Negative for cough and chest tightness.   Cardiovascular: Negative for chest pain, palpitations and leg swelling.  Gastrointestinal: Negative for nausea, vomiting (no nausea or vomiting now. ), abdominal pain and diarrhea.  Genitourinary: Negative for dysuria and difficulty urinating.  Musculoskeletal: Negative for back pain and joint swelling.  Skin: Negative for color change and rash.  Neurological: Negative for dizziness, light-headedness and headaches.  Psychiatric/Behavioral: Negative for dysphoric mood and agitation.       Objective:    Physical Exam  Constitutional: She appears well-developed and well-nourished. No distress.  HENT:  Nose: Nose normal.  Mouth/Throat: Oropharynx is clear and moist.  Neck: Neck supple.  Neck fullness - right lateral neck.    Cardiovascular: Normal rate and regular rhythm.   Pulmonary/Chest: Breath sounds normal. No respiratory distress. She has no wheezes.  Abdominal: Soft. Bowel sounds are normal. There is no tenderness.  Musculoskeletal: She exhibits no edema or tenderness.  Lymphadenopathy:    She has no cervical adenopathy.  Skin: No rash noted. No erythema.  Psychiatric: She has a normal mood and affect. Her behavior is normal.    BP 120/80 mmHg  Pulse 84  Temp(Src) 98.5 F (36.9 C) (Oral)  Resp 18  Ht 5' 6.5" (1.689 m)  Wt 194 lb 8 oz (88.225 kg)  BMI 30.93 kg/m2  SpO2 96% Wt Readings from Last 3 Encounters:  01/23/16 194 lb 8 oz (88.225 kg)  01/04/16 189 lb (85.73 kg)  12/26/15 183 lb (83.008 kg)     Lab Results  Component Value Date   WBC 5.7 01/23/2016   HGB 13.9 01/23/2016   HCT 41.3 01/23/2016   PLT 291.0 01/23/2016   GLUCOSE 86 01/23/2016   CHOL 197 07/25/2015   TRIG 159.0* 07/25/2015    HDL 41.10 07/25/2015   LDLDIRECT 124.3 01/18/2013   LDLCALC 124* 07/25/2015   ALT 16 01/23/2016   AST 20 01/23/2016   NA 140 01/23/2016   K 4.5 01/23/2016   CL 105 01/23/2016   CREATININE 0.68 01/23/2016   BUN 15 01/23/2016   CO2 30 01/23/2016   TSH 2.39 01/23/2016   HGBA1C 5.8 07/25/2015       Assessment & Plan:   Problem List Items Addressed This Visit    Fatigue    Has had multiple visits recently with various complaints.  Had weakness, sob and cough.  Treated for bronchitis.  CXR clear as outlined.  Symptoms have improved.  Had syncopal episode.  Saw Dr Ubaldo Glassing.  Holter placed.  ECHO.  Has f/u with Dr Ubaldo Glassing 01/31/16.  She is feeling better.  Cough is better.  Breathing better.  Still decreased energy.  Some increased heart rate with exertion.  Await holter and echo results.  Check cbc, tsh and metabolic panel.  Follow closely.  Rest.  Fluids.  Get her back in soon to reassess.       Relevant Orders   CBC with Differential/Platelet (Completed)   TSH (Completed)   Hepatic function panel (Completed)   Basic metabolic panel (Completed)   Hypercholesterolemia    Follow lipid panel.        Hyperglycemia    Low carb diet and exercise.  Follow met b and a1c.  She had not eaten today.  Blood sugar 90.  Felt better after juice and snack.  Follow.        Neck fullness    Neck fullness as outlined.  She reports is better.  Swallowing ok.  Get her back in soon to reassess.  Consider CT if persistent.  Check cbc.        Other Visit Diagnoses    Screening breast examination    -  Primary    Relevant Orders    MM DIGITAL SCREENING BILATERAL    Low glucose level        Relevant Orders    POCT glucose (manual entry) (Completed)        Einar Pheasant, MD

## 2016-01-24 ENCOUNTER — Telehealth: Payer: Self-pay | Admitting: *Deleted

## 2016-01-24 NOTE — Telephone Encounter (Signed)
Patient stated that she received a call after her lab results were given,she was not sure for the reason of the call.  Pt contact [phone]

## 2016-01-24 NOTE — Telephone Encounter (Signed)
Spoke with patient, see result notes for details. thanks

## 2016-01-24 NOTE — Telephone Encounter (Signed)
I didn't call, I am guessing Phineas Real may have tried, but didn't see I had called already. thanks

## 2016-01-24 NOTE — Telephone Encounter (Signed)
Patient has requested her lab results from 06/27

## 2016-01-25 ENCOUNTER — Encounter: Payer: Self-pay | Admitting: Internal Medicine

## 2016-01-28 ENCOUNTER — Encounter: Payer: Self-pay | Admitting: Internal Medicine

## 2016-01-28 DIAGNOSIS — R5383 Other fatigue: Secondary | ICD-10-CM | POA: Insufficient documentation

## 2016-01-28 DIAGNOSIS — R221 Localized swelling, mass and lump, neck: Secondary | ICD-10-CM | POA: Insufficient documentation

## 2016-01-28 NOTE — Assessment & Plan Note (Signed)
Has had multiple visits recently with various complaints.  Had weakness, sob and cough.  Treated for bronchitis.  CXR clear as outlined.  Symptoms have improved.  Had syncopal episode.  Saw Dr Ubaldo Glassing.  Holter placed.  ECHO.  Has f/u with Dr Ubaldo Glassing 01/31/16.  She is feeling better.  Cough is better.  Breathing better.  Still decreased energy.  Some increased heart rate with exertion.  Await holter and echo results.  Check cbc, tsh and metabolic panel.  Follow closely.  Rest.  Fluids.  Get her back in soon to reassess.

## 2016-01-28 NOTE — Assessment & Plan Note (Signed)
Follow lipid panel.   

## 2016-01-28 NOTE — Assessment & Plan Note (Addendum)
Low carb diet and exercise.  Follow met b and a1c.  She had not eaten today.  Blood sugar 90.  Felt better after juice and snack.  Follow.

## 2016-01-28 NOTE — Assessment & Plan Note (Signed)
Neck fullness as outlined.  She reports is better.  Swallowing ok.  Get her back in soon to reassess.  Consider CT if persistent.  Check cbc.

## 2016-02-15 ENCOUNTER — Ambulatory Visit
Admission: RE | Admit: 2016-02-15 | Discharge: 2016-02-15 | Disposition: A | Discharge status: Home / Self Care | Payer: BLUE CROSS/BLUE SHIELD | Source: Ambulatory Visit | Attending: Internal Medicine | Admitting: Internal Medicine

## 2016-02-15 ENCOUNTER — Other Ambulatory Visit: Payer: Self-pay | Admitting: Internal Medicine

## 2016-02-15 DIAGNOSIS — Z1231 Encounter for screening mammogram for malignant neoplasm of breast: Secondary | ICD-10-CM | POA: Insufficient documentation

## 2016-02-15 DIAGNOSIS — Z1239 Encounter for other screening for malignant neoplasm of breast: Secondary | ICD-10-CM

## 2016-02-15 IMAGING — MG MM DIGITAL SCREENING BILAT W/ TOMO W/ CAD
8 of 12 series · 8 of 28 positions shown · non-contrast
Comparison: Previous exam(s).

CLINICAL DATA: Screening.

EXAM:
2D DIGITAL SCREENING BILATERAL MAMMOGRAM WITH CAD AND ADJUNCT TOMO

[L CC]
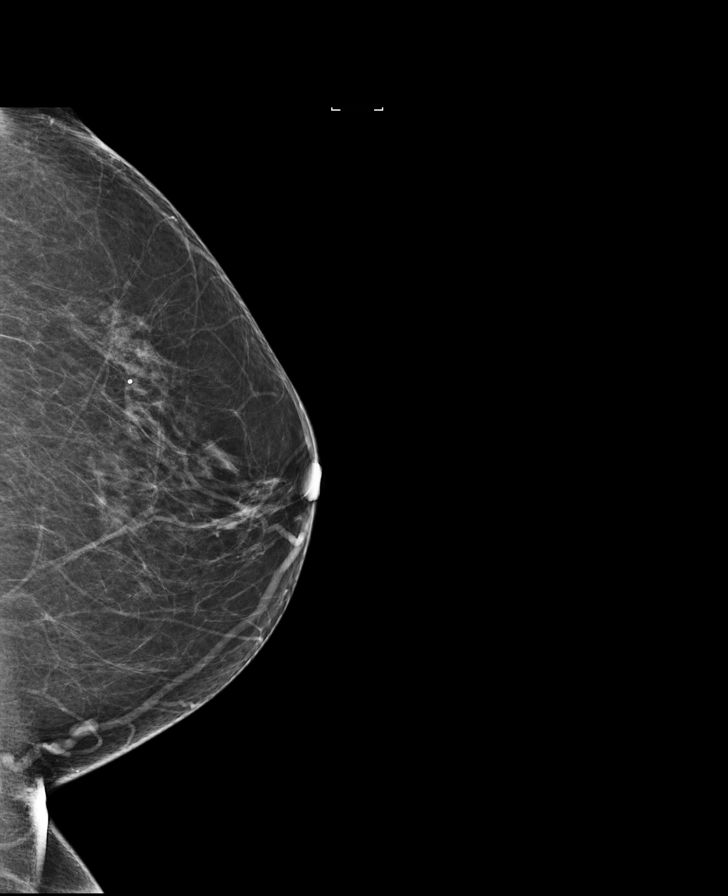

[R MLO synth-2D]
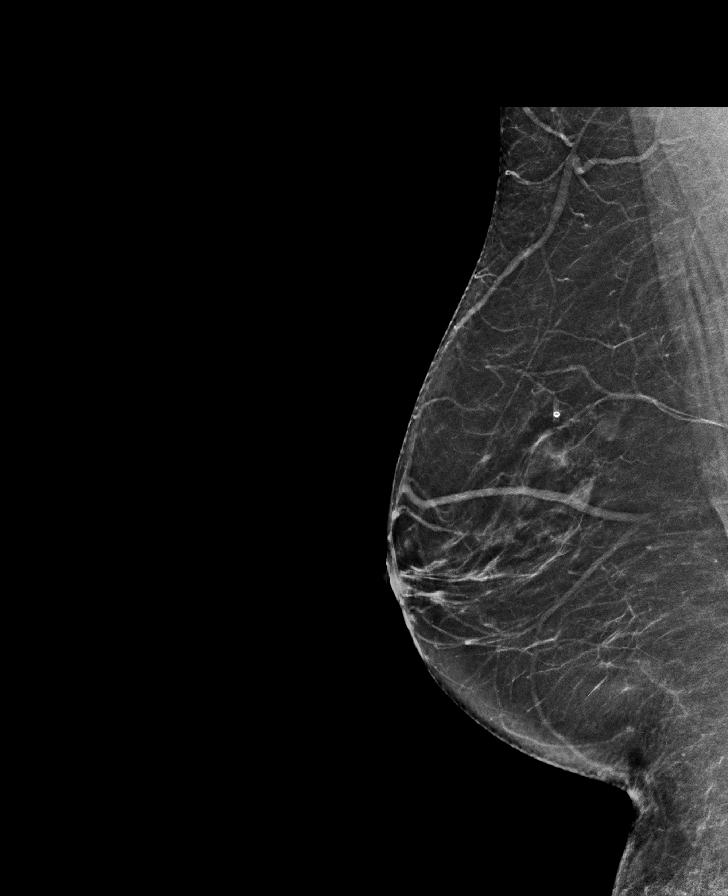

[L MLO]
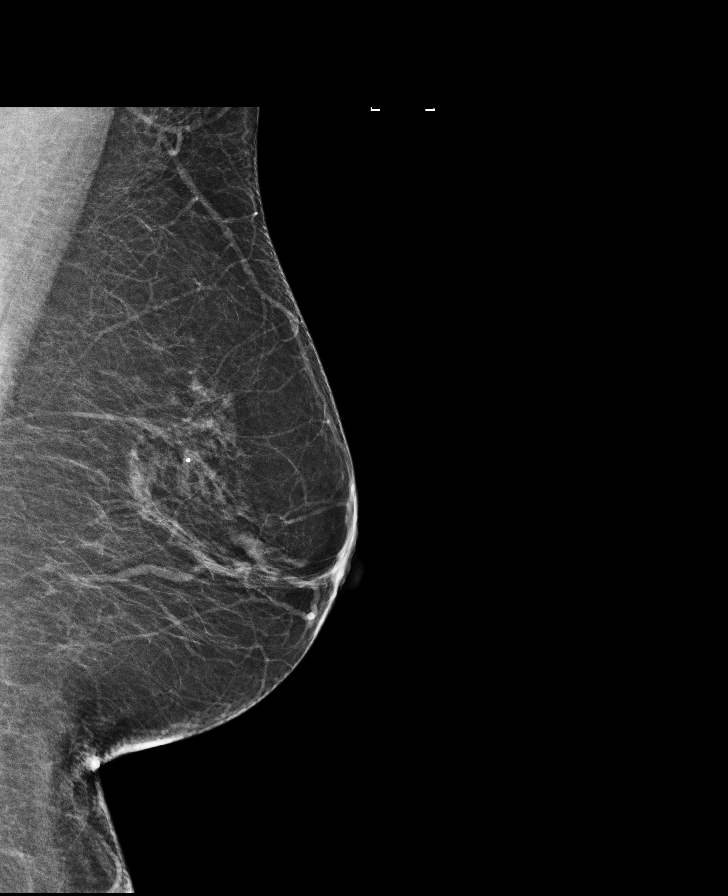

[L MLO synth-2D]
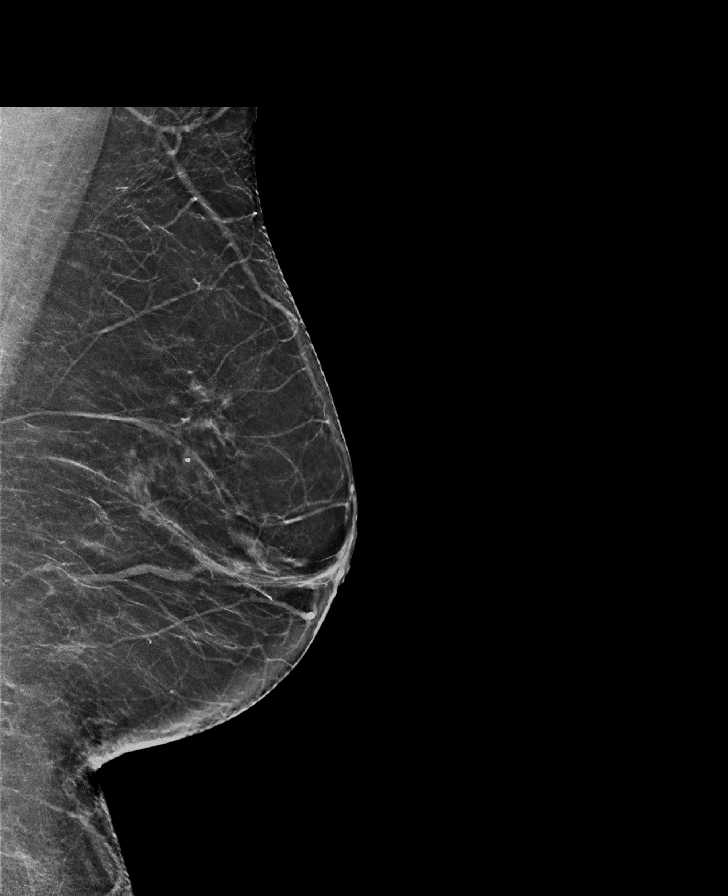

[R CC]
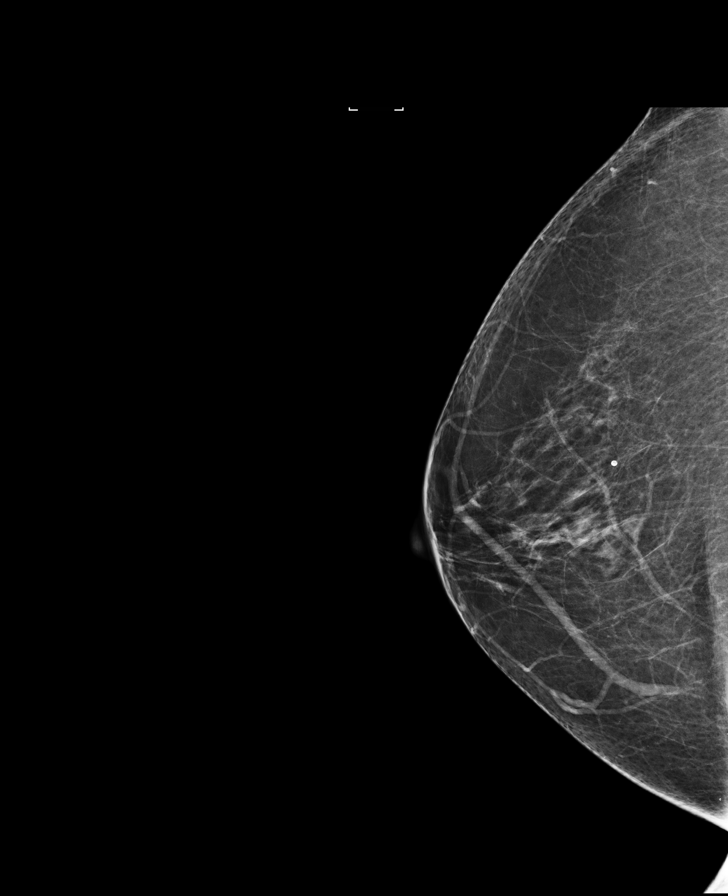

[L CC synth-2D]
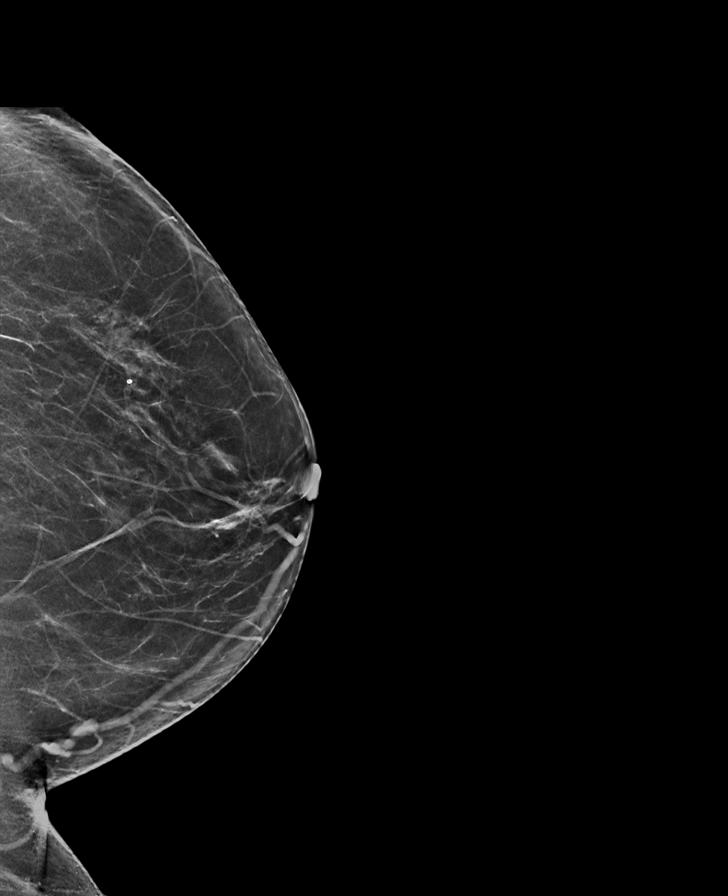

[R MLO]
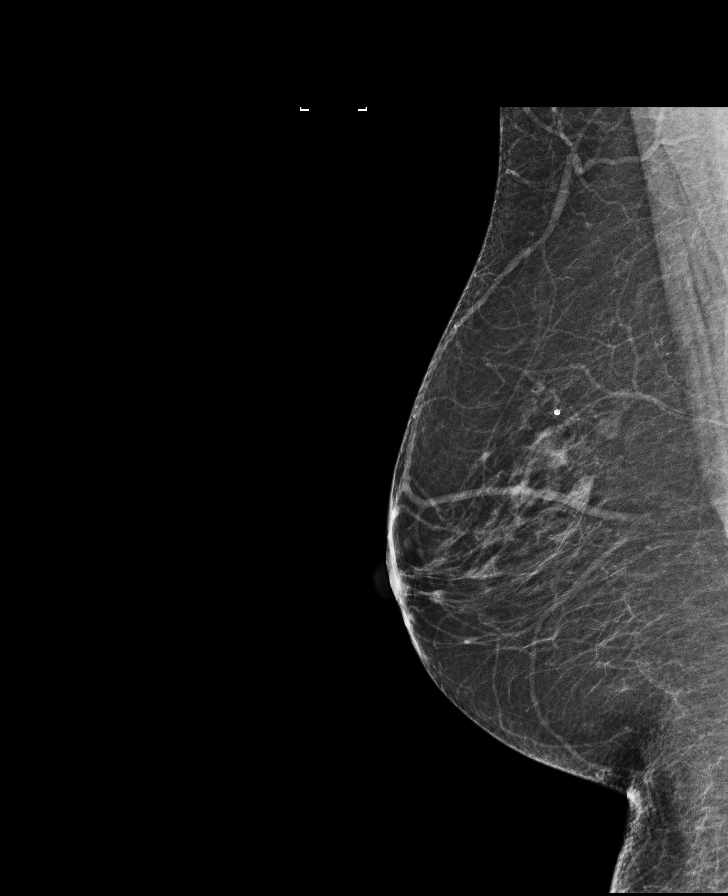

[R CC synth-2D]
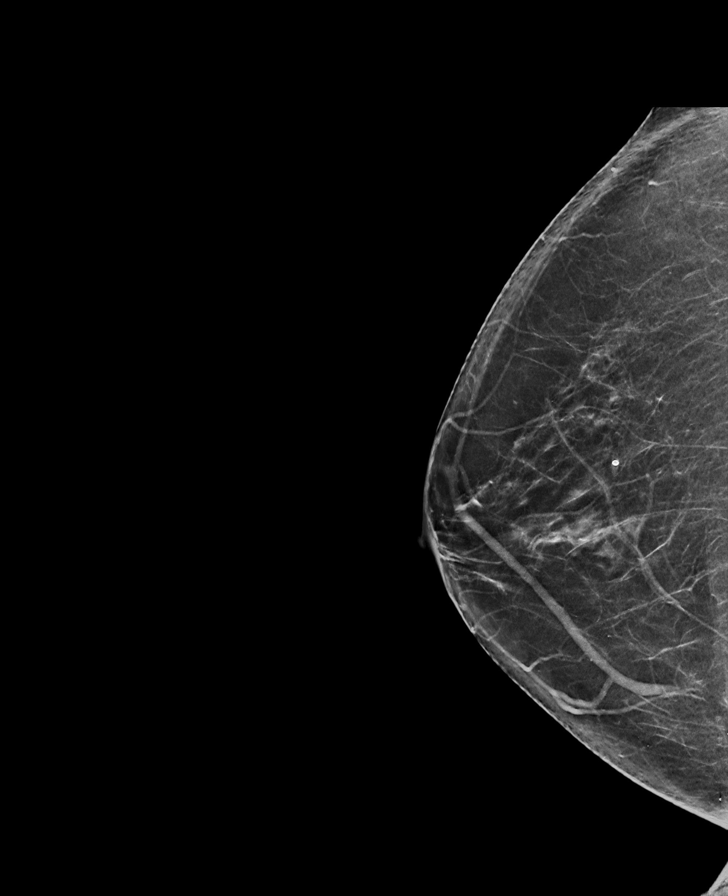

[8 of 28 positions shown; findings below may reference images not displayed]

ACR Breast Density Category b: There are scattered areas of
fibroglandular density.
FINDINGS: There are no findings suspicious for malignancy. Images were
processed with CAD.
IMPRESSION: No mammographic evidence of malignancy. A result letter of this
screening mammogram will be mailed directly to the patient.

RECOMMENDATION:
Screening mammogram in one year. (Code:[33])

BI-RADS CATEGORY  1: Negative.

## 2016-02-21 ENCOUNTER — Ambulatory Visit (INDEPENDENT_AMBULATORY_CARE_PROVIDER_SITE_OTHER): Payer: BLUE CROSS/BLUE SHIELD | Admitting: Internal Medicine

## 2016-02-21 ENCOUNTER — Ambulatory Visit (INDEPENDENT_AMBULATORY_CARE_PROVIDER_SITE_OTHER): Payer: BLUE CROSS/BLUE SHIELD

## 2016-02-21 ENCOUNTER — Ambulatory Visit: Payer: BLUE CROSS/BLUE SHIELD | Admitting: Internal Medicine

## 2016-02-21 ENCOUNTER — Encounter: Payer: Self-pay | Admitting: Internal Medicine

## 2016-02-21 ENCOUNTER — Telehealth: Payer: Self-pay | Admitting: *Deleted

## 2016-02-21 VITALS — BP 104/80 | HR 106 | Temp 99.0°F | Wt 195.0 lb

## 2016-02-21 DIAGNOSIS — E7800 Pure hypercholesterolemia, unspecified: Secondary | ICD-10-CM | POA: Diagnosis not present

## 2016-02-21 DIAGNOSIS — M25512 Pain in left shoulder: Secondary | ICD-10-CM | POA: Diagnosis not present

## 2016-02-21 DIAGNOSIS — R739 Hyperglycemia, unspecified: Secondary | ICD-10-CM | POA: Diagnosis not present

## 2016-02-21 DIAGNOSIS — M25511 Pain in right shoulder: Secondary | ICD-10-CM

## 2016-02-21 DIAGNOSIS — K219 Gastro-esophageal reflux disease without esophagitis: Secondary | ICD-10-CM

## 2016-02-21 DIAGNOSIS — R221 Localized swelling, mass and lump, neck: Secondary | ICD-10-CM

## 2016-02-21 DIAGNOSIS — R5383 Other fatigue: Secondary | ICD-10-CM

## 2016-02-21 IMAGING — DX DG SHOULDER 2+V*L*
3 series · 3 of 3 positions shown · non-contrast
Comparison: None.

CLINICAL DATA: Persistent pain, limited range of motion, no known
injury

EXAM:
LEFT SHOULDER - 2+ VIEW

[shoulder (grashey) ap]
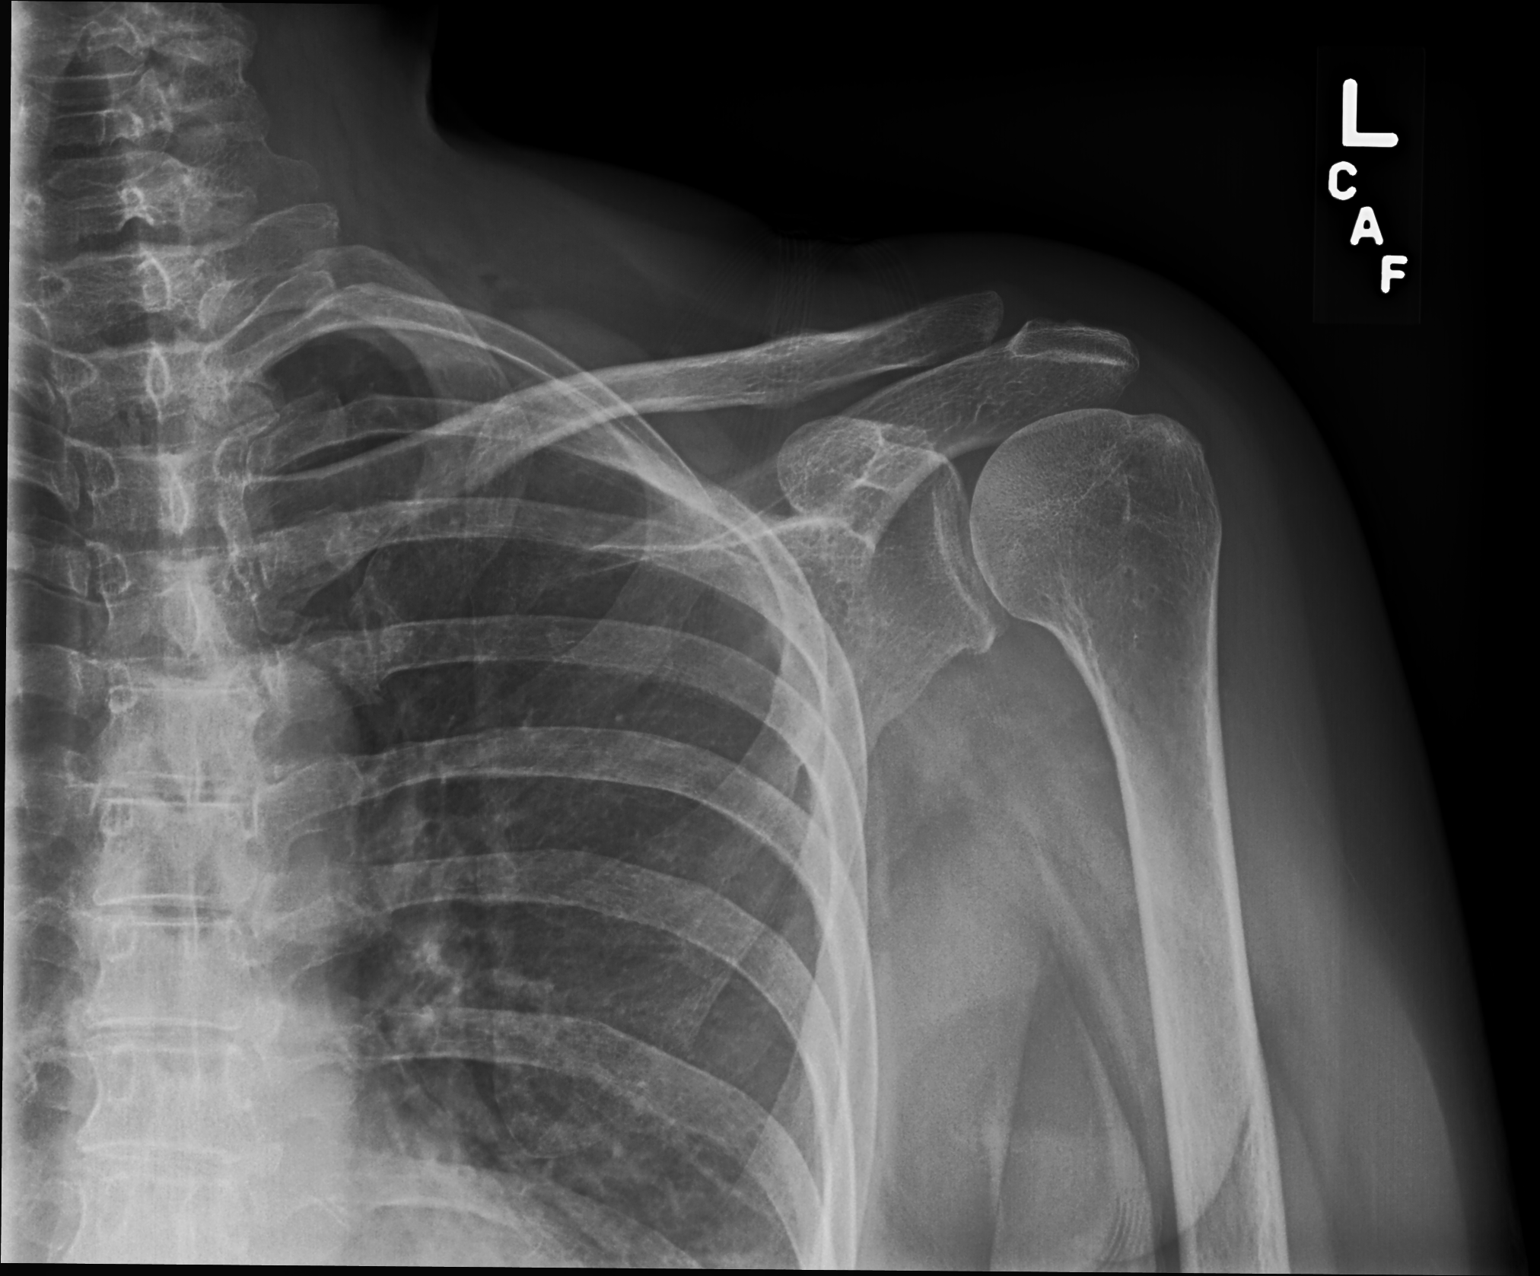

[shoulder y view]
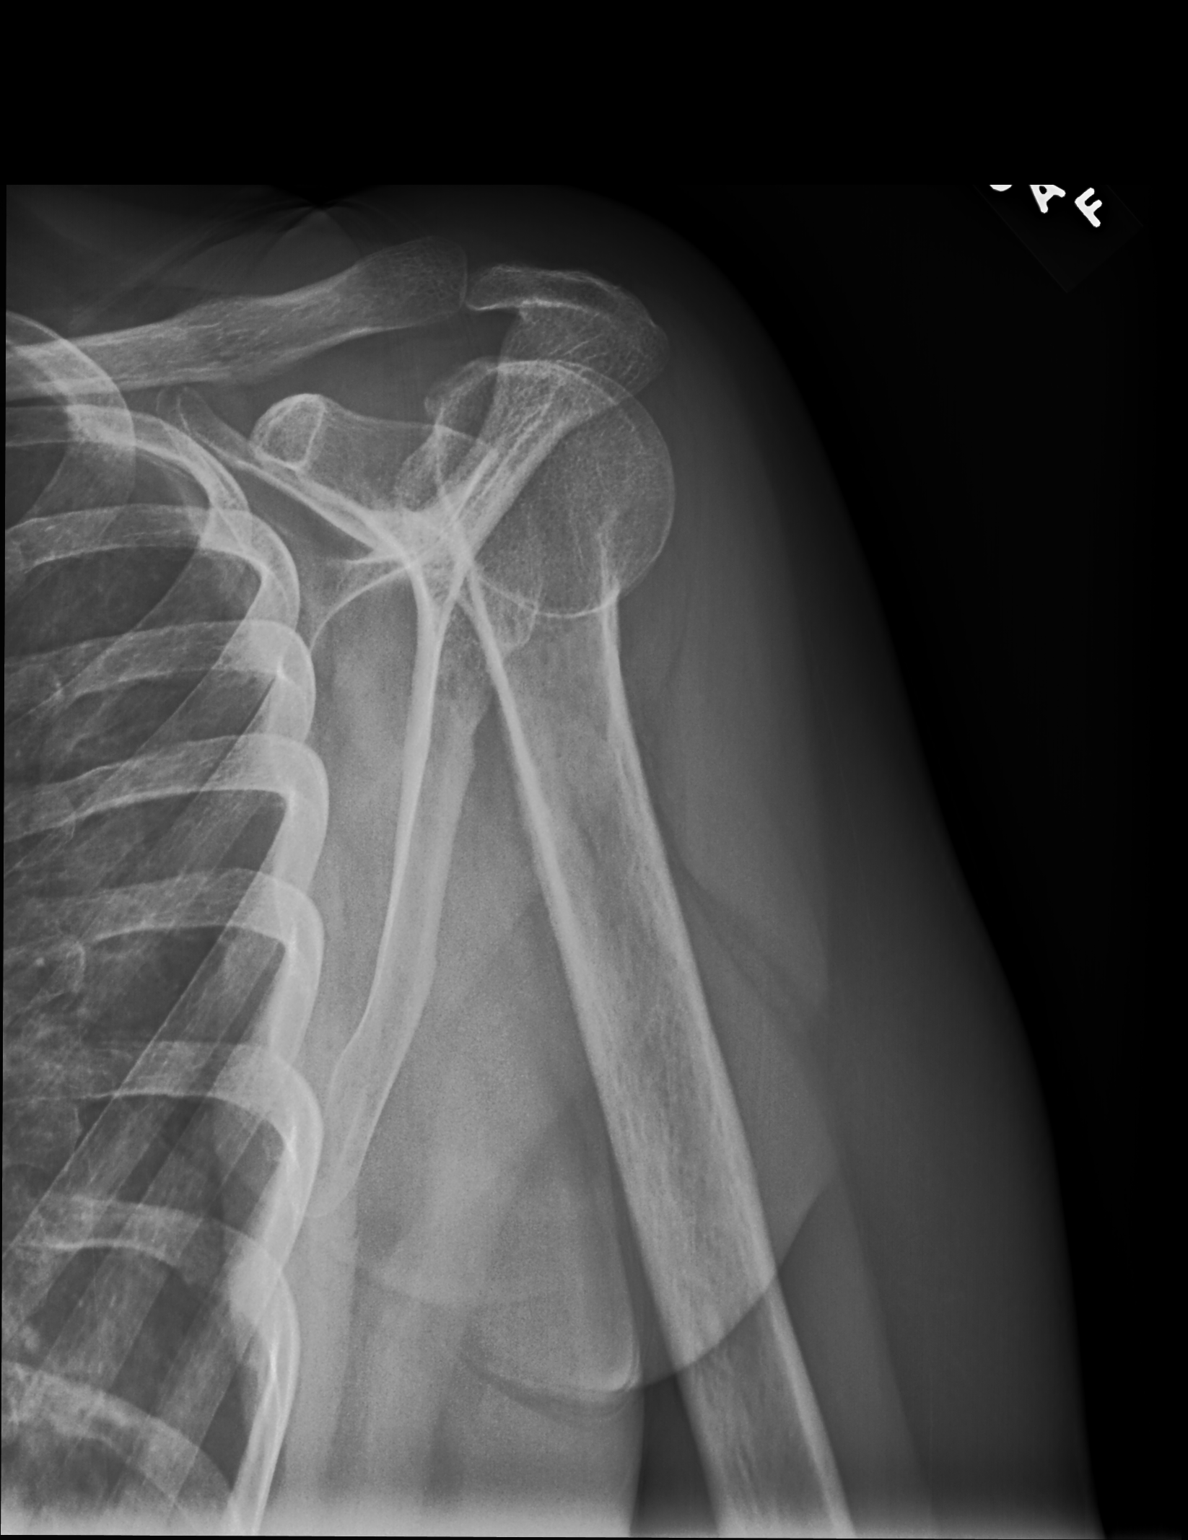

[shoulder axillary pa]
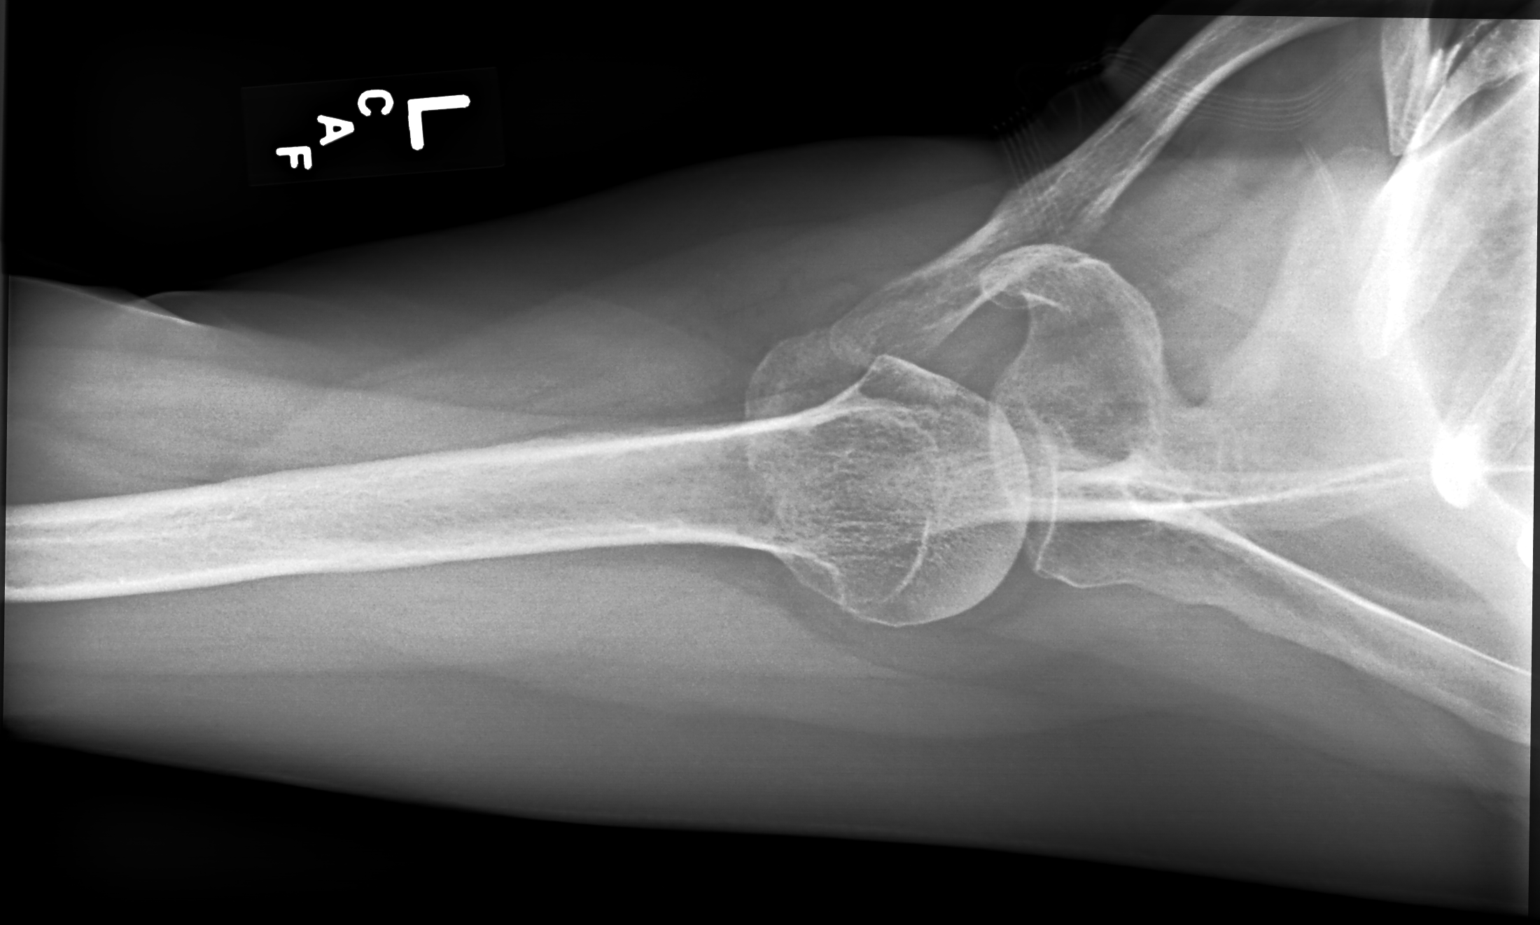

[3 of 3 positions shown; findings below may reference images not displayed]

FINDINGS: No fracture or dislocation is seen.

The joint spaces are preserved.

Visualized soft tissues are within normal limits.

Visualized left lung is clear.
IMPRESSION: No acute osseus abnormality is seen.

## 2016-02-21 IMAGING — DX DG SHOULDER 2+V*R*
3 series · 3 of 3 positions shown · non-contrast
Comparison: None.

CLINICAL DATA: Persistent pain, limited range of motion, no known
injury

EXAM:
RIGHT SHOULDER - 2+ VIEW

[shoulder (grashey) ap]
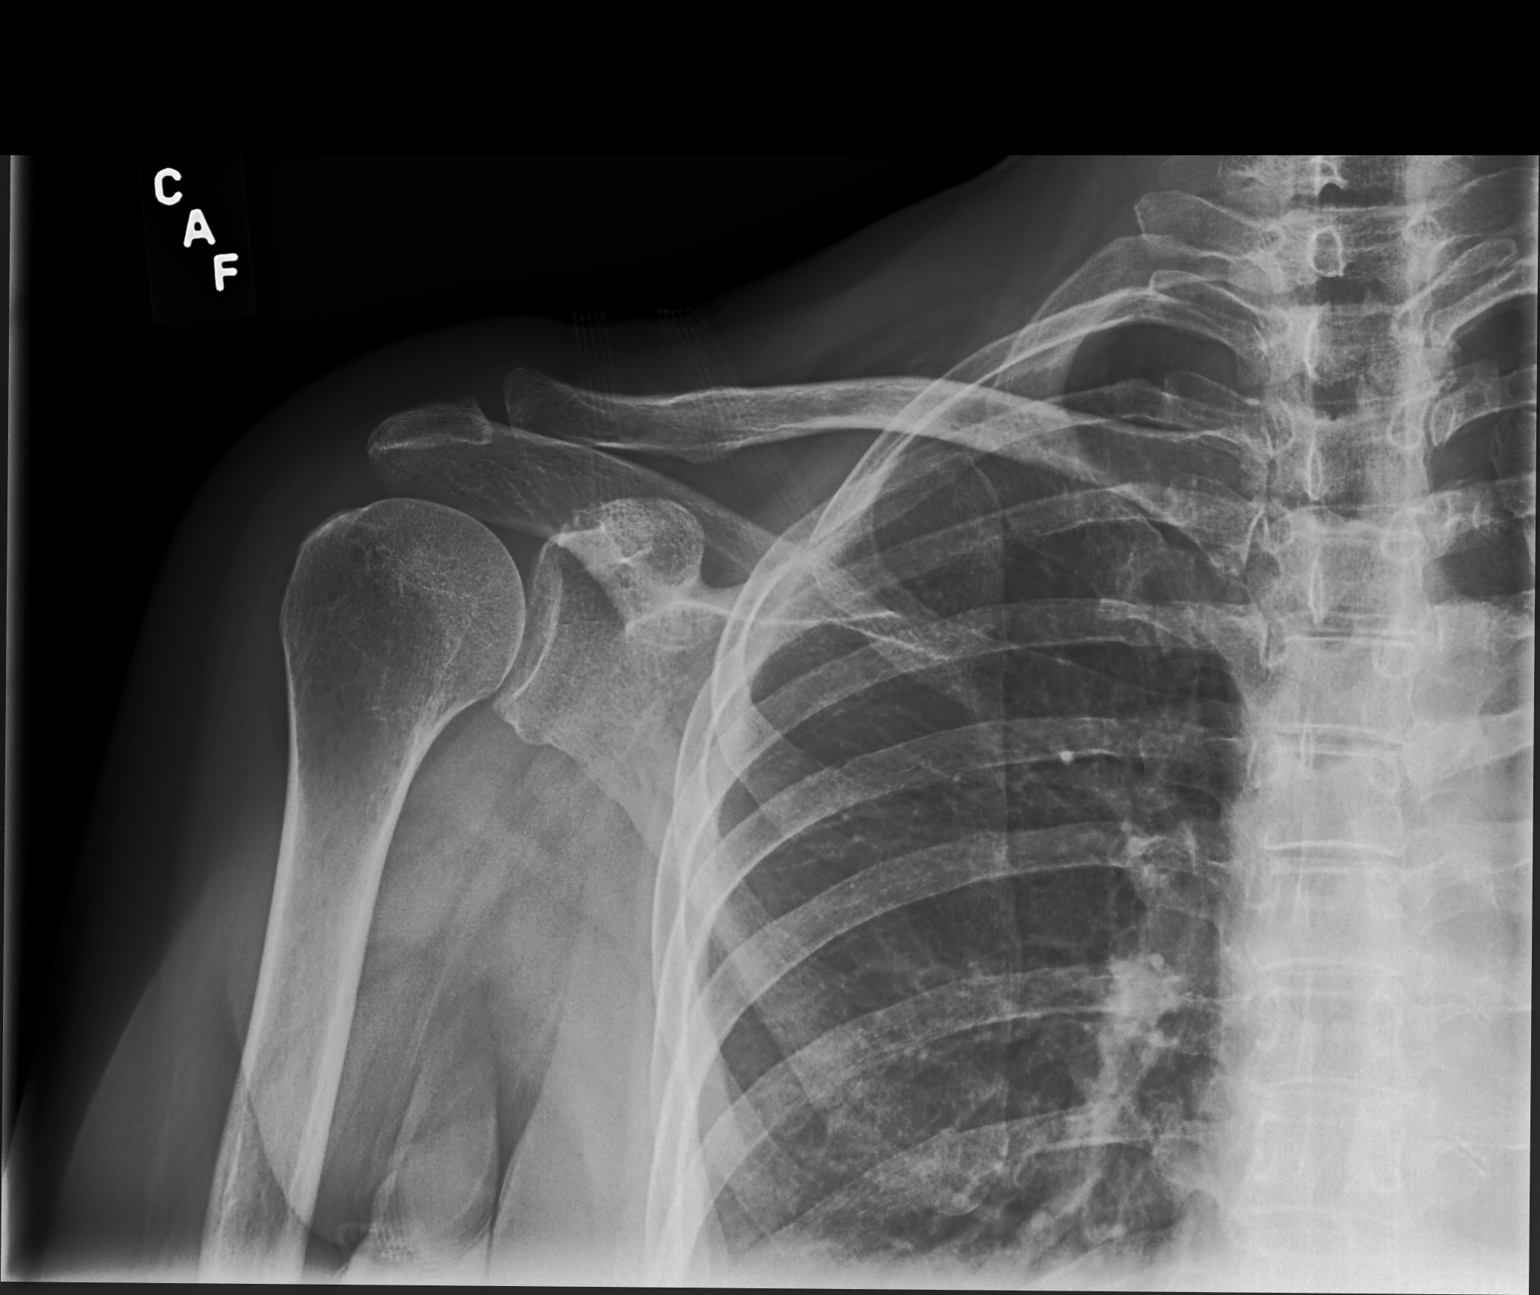

[shoulder y view]
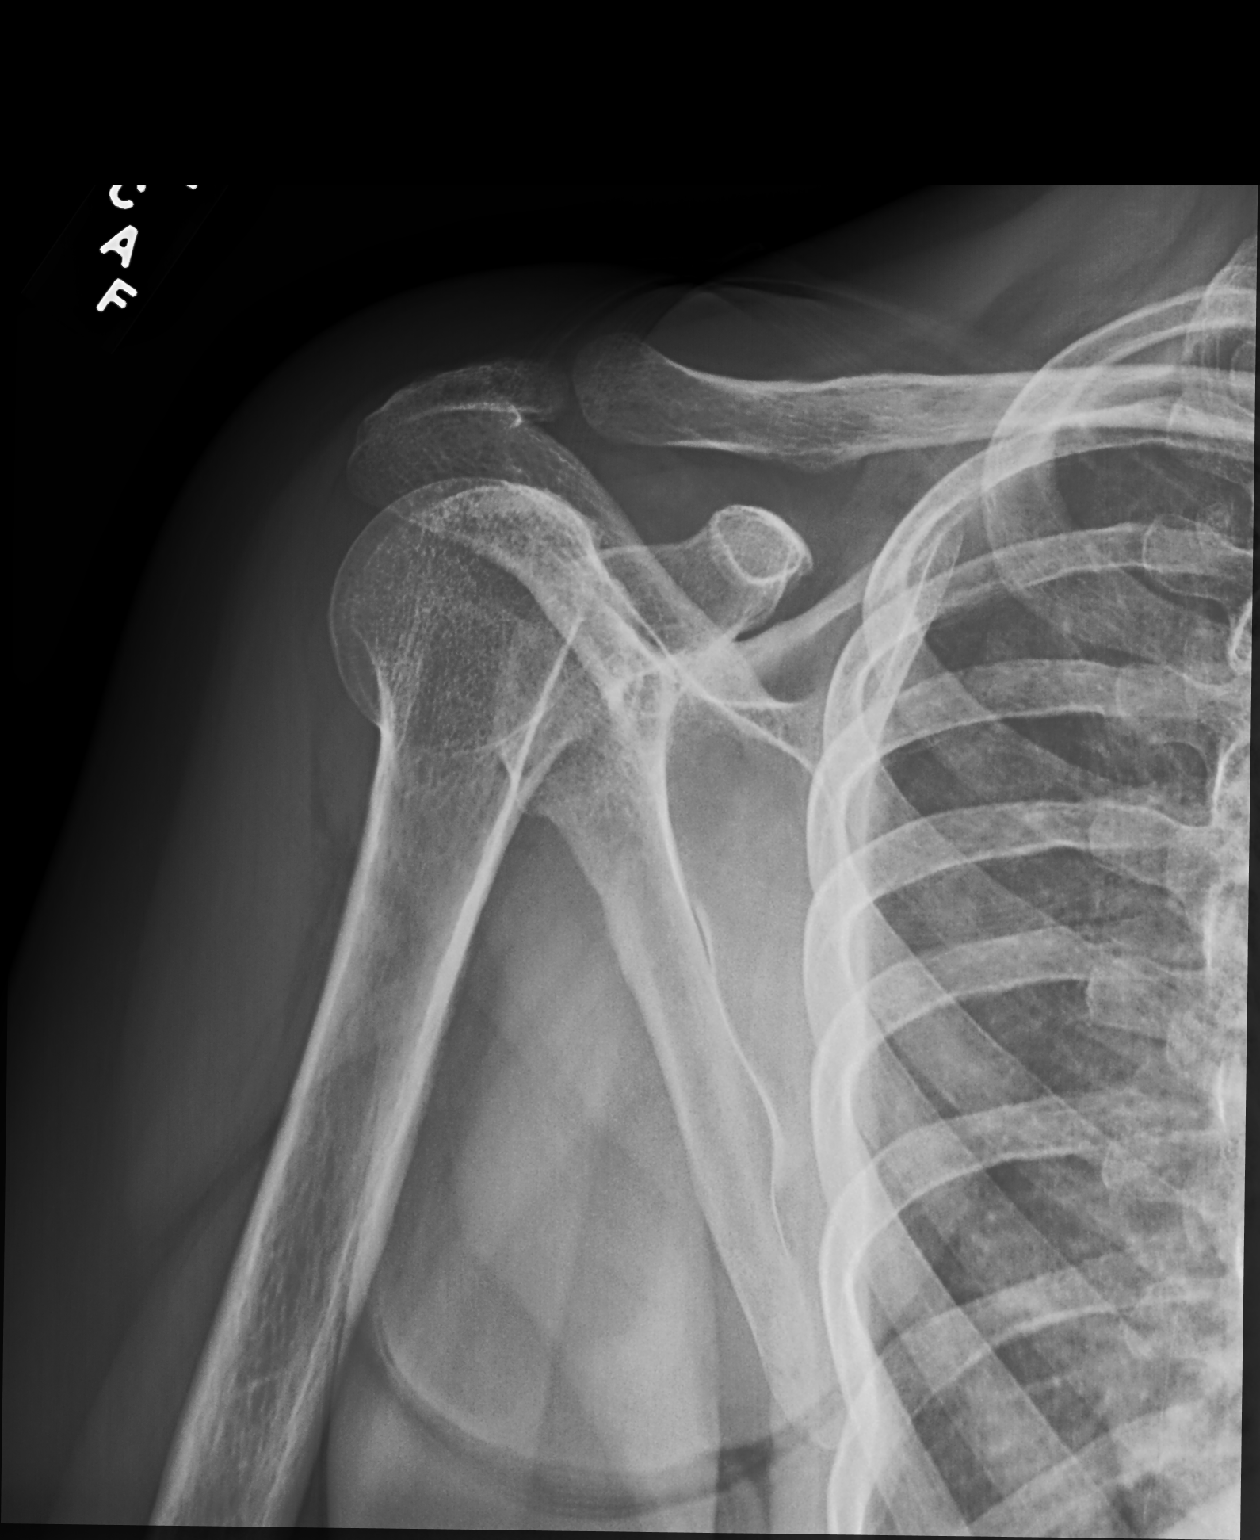

[shoulder axillary pa]
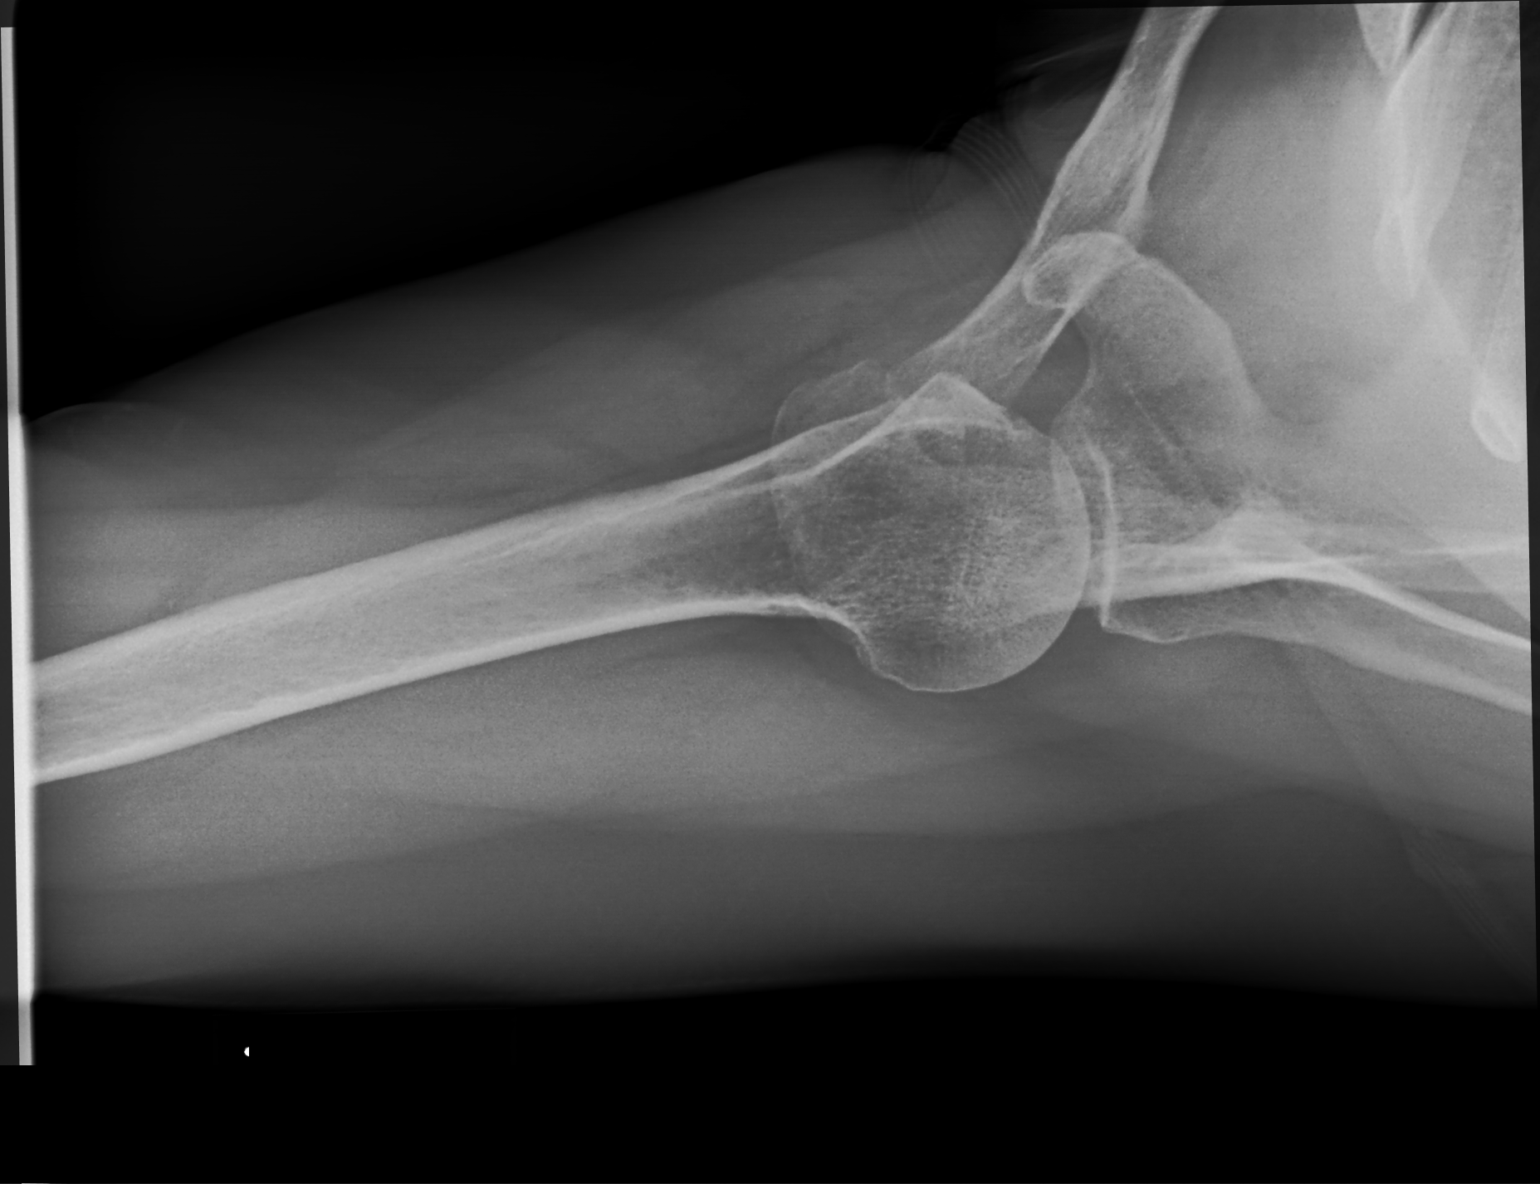

[3 of 3 positions shown; findings below may reference images not displayed]

FINDINGS: No fracture or dislocation is seen.

The joint spaces are preserved.

The visualized soft tissues are unremarkable.

Visualized right lung is clear.
IMPRESSION: No acute osseus abnormality is seen.

## 2016-02-21 NOTE — Progress Notes (Signed)
Patient ID: DLISA BARNWELL, female   DOB: Jun 20, 1951, 65 y.o.   MRN: [id number]   Subjective:    Patient ID: JOSELIN CRANDELL, female    DOB: 11-28-1950, 66 y.o.   MRN: [id number]  HPI  Patient here for a scheduled follow up.  Reports some persistent fullness in her neck, but is better.  States seems to get better and worse at times.  Congestion better.  Still some cough.  Occasional acid reflux, but reports that this does not occur frequently.  No chest pain.  Does report noticing her heart beating fast.  States when her heart rate is 80 - feels fast.  Oxygenation has been better.  Does report increased right shoulder pain.  Increased pain with reaching around and lifting.  Left shoulder hurts, but not as bad.  Just evaluated by cardiology.  See Dr Bethanne Ginger note.     Past Medical History:  Diagnosis Date  . Arthritis   . Chicken pox   . Cholecystitis 11/2011   Did not require sgy - Dr. Staci Acosta - [facility]  (cholelithiasis)  . H/O Clostridium difficile infection   . Hypertension   . IBS (irritable bowel syndrome)   . MRSA exposure 2005   Spider bite  . MVP (mitral valve prolapse)    Stable - Dr. Ubaldo Glassing  . Rheumatic fever    Past Surgical History:  Procedure Laterality Date  . CATARACT EXTRACTION  [id number]  . MUSCLE BIOPSY     Family History  Problem Relation Age of Onset  . Arthritis Mother   . Stroke Mother   . Diabetes Mother   . Hypertension Mother   . Arthritis Father   . Stroke Father   . Diabetes Paternal Grandmother   . Cancer Paternal Uncle     colon  . Breast cancer Maternal Aunt   . Heart disease      maternal and paternal side  . Breast cancer Paternal Aunt   . Breast cancer Cousin   . Breast cancer Cousin     female cousin   Social History   Social History  . Marital status: Married    Spouse name: N/A  . Number of children: N/A  . Years of education: 52   Occupational History  . Retired    Social History Main Topics  . Smoking status: Never Smoker  .  Smokeless tobacco: Never Used  . Alcohol use 0.0 oz/week     Comment: Rarely  . Drug use: No  . Sexual activity: Yes    Partners: Male    Birth control/ protection: Post-menopausal   Other Topics Concern  . None   Social History Narrative  . None    Outpatient Encounter Prescriptions as of 02/21/2016  Medication Sig  . sodium chloride (OCEAN) 0.65 % SOLN nasal spray Place 2 sprays into both nostrils every 2 (two) hours while awake.  . fluticasone (FLONASE) 50 MCG/ACT nasal spray Place 1 spray into both nostrils 2 (two) times daily.  . [DISCONTINUED] albuterol (PROVENTIL HFA;VENTOLIN HFA) 108 (90 Base) MCG/ACT inhaler Inhale 2 puffs into the lungs every 6 (six) hours as needed for wheezing or shortness of breath.   No facility-administered encounter medications on file as of 02/21/2016.     Review of Systems  Constitutional: Positive for fatigue. Negative for appetite change and unexpected weight change.  HENT: Negative for sinus pressure and sore throat.   Respiratory: Positive for cough. Negative for chest tightness and shortness of breath.   Cardiovascular:  Negative for chest pain and leg swelling.       Sensation of increased heart rate as outlined.    Gastrointestinal: Negative for abdominal pain, diarrhea, nausea and vomiting.  Genitourinary: Negative for difficulty urinating and dysuria.  Musculoskeletal: Negative for joint swelling.       Bilateral shoulder pain as outlined.    Skin: Negative for color change and rash.  Neurological: Negative for dizziness and headaches.       Still some occasional light headedness.    Psychiatric/Behavioral: Negative for agitation and dysphoric mood.       Objective:    Physical Exam  Constitutional: She appears well-developed and well-nourished. No distress.  HENT:  Nose: Nose normal.  Mouth/Throat: Oropharynx is clear and moist.  Neck: Neck supple. No thyromegaly present.  Cardiovascular: Normal rate and regular rhythm.     Pulmonary/Chest: Breath sounds normal. No respiratory distress. She has no wheezes.  Abdominal: Soft. Bowel sounds are normal. There is no tenderness.  Musculoskeletal: She exhibits no edema or tenderness.  Increased pain in her right shoulder with attempts at full extension.    Lymphadenopathy:    She has no cervical adenopathy.  Skin: No rash noted. No erythema.  Psychiatric: She has a normal mood and affect. Her behavior is normal.    BP 104/80   Pulse (!) 106   Temp 99 F (37.2 C) (Oral)   Wt 195 lb (88.5 kg)   SpO2 96%   BMI 31.00 kg/m  Wt Readings from Last 3 Encounters:  02/21/16 195 lb (88.5 kg)  01/23/16 194 lb 8 oz (88.2 kg)  01/04/16 189 lb (85.7 kg)     Lab Results  Component Value Date   WBC 5.7 01/23/2016   HGB 13.9 01/23/2016   HCT 41.3 01/23/2016   PLT 291.0 01/23/2016   GLUCOSE 86 01/23/2016   CHOL 197 07/25/2015   TRIG 159.0 (H) 07/25/2015   HDL 41.10 07/25/2015   LDLDIRECT 124.3 01/18/2013   LDLCALC 124 (H) 07/25/2015   ALT 16 01/23/2016   AST 20 01/23/2016   NA 140 01/23/2016   K 4.5 01/23/2016   CL 105 01/23/2016   CREATININE 0.68 01/23/2016   BUN 15 01/23/2016   CO2 30 01/23/2016   TSH 2.39 01/23/2016   HGBA1C 5.8 07/25/2015    Mm Screening Breast Tomo Bilateral  Result Date: 02/15/2016 CLINICAL DATA:  Screening. EXAM: 2D DIGITAL SCREENING BILATERAL MAMMOGRAM WITH CAD AND ADJUNCT TOMO COMPARISON:  Previous exam(s). ACR Breast Density Category b: There are scattered areas of fibroglandular density. FINDINGS: There are no findings suspicious for malignancy. Images were processed with CAD. IMPRESSION: No mammographic evidence of malignancy. A result letter of this screening mammogram will be mailed directly to the patient. RECOMMENDATION: Screening mammogram in one year. (Code:SM-B-01Y) BI-RADS CATEGORY  1: Negative. Electronically Signed   By: Lovey Newcomer M.D.   On: 02/15/2016 12:58       Assessment & Plan:   Problem List Items Addressed  This Visit    Bilateral shoulder pain    Persistent and limited rom.  Check shoulder xray.        Fatigue    Felt to be multifactorial.  Recently treated for bronchitis.  Had syncopal episode.  See last note.  Saw cardiology.  holter - no significant arrhythmia.  ECHO per report - ok.  Still some cough. Discussed treating GERD.  With the neck fullness, obtain CT neck.  Check shoulder xray.  GERD (gastroesophageal reflux disease)    Reported some occasional acid reflux.  Discussed starting zantac.  See if this helps with cough.        Hypercholesterolemia    Low cholesterol diet and exercise.  Follow lipid panel.        Hyperglycemia    Low carb diet and exercise.  Follow met b and a1c.       Neck fullness    Persistent fullness.  Will obtain CT neck.        Relevant Orders   CT Soft Tissue Neck W Contrast    Other Visit Diagnoses    Pain of both shoulder joints    -  Primary   Relevant Orders   DG Shoulder Left (Completed)   DG Shoulder Right (Completed)   Mass of right side of neck       Relevant Orders   CT Soft Tissue Neck W Contrast       Einar Pheasant, MD

## 2016-02-21 NOTE — Telephone Encounter (Signed)
She agreed to this appt.

## 2016-02-21 NOTE — Patient Instructions (Signed)
Zantac (ranitidine) 150mg  - take one tablet per day (take 30 minutes before breakfast).

## 2016-02-21 NOTE — Telephone Encounter (Signed)
sched pt 8/30 @12 

## 2016-02-21 NOTE — Progress Notes (Signed)
Pre visit review using our clinic review tool, if applicable. No additional management support is needed unless otherwise documented below in the visit note. 

## 2016-02-21 NOTE — Telephone Encounter (Signed)
Patient will need a 30 min. Follow up appt. In 1 month. - Thanks [employer]

## 2016-02-22 ENCOUNTER — Telehealth: Payer: Self-pay | Admitting: Internal Medicine

## 2016-02-22 ENCOUNTER — Other Ambulatory Visit: Payer: Self-pay | Admitting: Internal Medicine

## 2016-02-22 NOTE — Telephone Encounter (Signed)
Pt is calling for the results from her x-ray that was done yesterday. Please call her at [phone].

## 2016-02-22 NOTE — Telephone Encounter (Signed)
Reviewed xray results with patient. No questions, thanks

## 2016-02-22 NOTE — Telephone Encounter (Signed)
Please advise for refills, thanks 

## 2016-02-22 NOTE — Telephone Encounter (Signed)
Pt called. Her zantac was not filled at her pharmacy. Could you please call that in. Also, please refill her     albuterol (PROVENTIL HFA;VENTOLIN HFA) 108 (90 Base) MCG/ACT inhaler(Expired)

## 2016-02-22 NOTE — Telephone Encounter (Signed)
Pt returned phone call. Pt asks that you let the phone ring for a long time.

## 2016-02-23 ENCOUNTER — Other Ambulatory Visit: Payer: Self-pay | Admitting: Internal Medicine

## 2016-02-23 MED ORDER — ALBUTEROL SULFATE HFA 108 (90 BASE) MCG/ACT IN AERS: 2.0000 | INHALATION_SPRAY | Freq: Four times a day (QID) | RESPIRATORY_TRACT | 1 refills | Status: DC | PRN

## 2016-02-23 NOTE — Telephone Encounter (Signed)
I have sent in the prescription for the albuterol.  The reason why the zantac was not sent in is because this is an otc medication.  I wrote down the medication and dose for her to get over the counter.  But, before she gets the medication, I need to clarify what her allergy to tartrazine is.  Hold off taking the zantac until can confirm the reaction and then will see what medication needs to take.

## 2016-02-23 NOTE — Telephone Encounter (Signed)
Left a detailed message for patient to return my call to confirm allergy, thanks

## 2016-02-24 ENCOUNTER — Encounter: Payer: Self-pay | Admitting: Internal Medicine

## 2016-02-24 DIAGNOSIS — M25512 Pain in left shoulder: Secondary | ICD-10-CM

## 2016-02-24 DIAGNOSIS — M25511 Pain in right shoulder: Secondary | ICD-10-CM | POA: Insufficient documentation

## 2016-02-24 NOTE — Assessment & Plan Note (Signed)
Felt to be multifactorial.  Recently treated for bronchitis.  Had syncopal episode.  See last note.  Saw cardiology.  holter - no significant arrhythmia.  ECHO per report - ok.  Still some cough. Discussed treating GERD.  With the neck fullness, obtain CT neck.  Check shoulder xray.

## 2016-02-24 NOTE — Assessment & Plan Note (Signed)
Persistent and limited rom.  Check shoulder xray.

## 2016-02-24 NOTE — Assessment & Plan Note (Signed)
Low carb diet and exercise.  Follow met b and a1c.  

## 2016-02-24 NOTE — Assessment & Plan Note (Signed)
Low cholesterol diet and exercise.  Follow lipid panel.   

## 2016-02-24 NOTE — Assessment & Plan Note (Signed)
Reported some occasional acid reflux.  Discussed starting zantac.  See if this helps with cough.

## 2016-02-24 NOTE — Assessment & Plan Note (Signed)
Persistent fullness.  Will obtain CT neck.

## 2016-02-26 ENCOUNTER — Telehealth: Payer: Self-pay | Admitting: Internal Medicine

## 2016-02-26 NOTE — Telephone Encounter (Signed)
Please advise this goes with the prior telephone note, thanks.

## 2016-02-26 NOTE — Telephone Encounter (Signed)
Pt called to give information regarding yellow number 5. It gives pt severe joint pain and muscle pain.    Call pt @ [phone]. Thank you!

## 2016-02-27 ENCOUNTER — Other Ambulatory Visit: Payer: Self-pay | Admitting: Internal Medicine

## 2016-02-27 NOTE — Telephone Encounter (Signed)
[phone] per the husband is the best number to call her at.  Keep letting the phone ring as the phone has numerous steps to allow her to answer it.    Spoke with the patient.  Gave information from Dr. Nicki Reaper. thanks

## 2016-02-27 NOTE — Telephone Encounter (Signed)
Notify pt to hold on starting zantac at this time.  Will hold given no significant issue with acid reflux.

## 2016-03-01 ENCOUNTER — Ambulatory Visit
Admission: RE | Admit: 2016-03-01 | Discharge: 2016-03-01 | Disposition: A | Discharge status: Home / Self Care | Payer: BLUE CROSS/BLUE SHIELD | Source: Ambulatory Visit | Attending: Internal Medicine | Admitting: Internal Medicine

## 2016-03-01 DIAGNOSIS — R221 Localized swelling, mass and lump, neck: Secondary | ICD-10-CM | POA: Insufficient documentation

## 2016-03-01 IMAGING — CT CT NECK W/ CM
3 of 4 series · 10 of 20 positions shown, 11 images · IV contrast (isovue)
Comparison: None.

CLINICAL DATA: Right neck mass and fullness on physical exam.
Asymptomatic.

EXAM:
CT NECK WITH CONTRAST
TECHNIQUE: Multidetector CT imaging of the neck was performed using the
standard protocol following the bolus administration of intravenous
contrast.
CONTRAST:  75 cc Isovue 300 intravenous

[Series 2: axial neck · axial · 0.56mm/px · z∈[-257,-137]mm · 3 of 122 slices shown]
[im 31/122  bone]
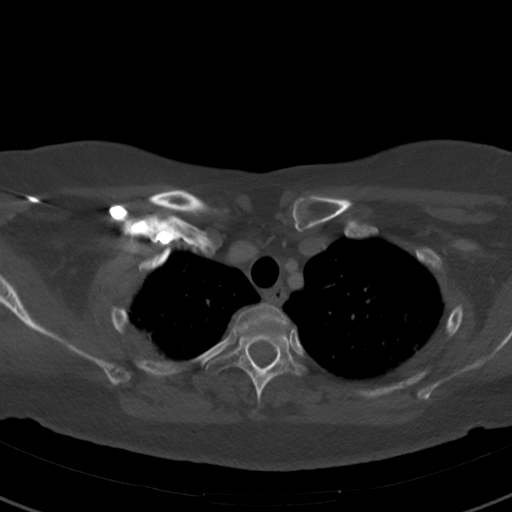
[im 61/122  bone]
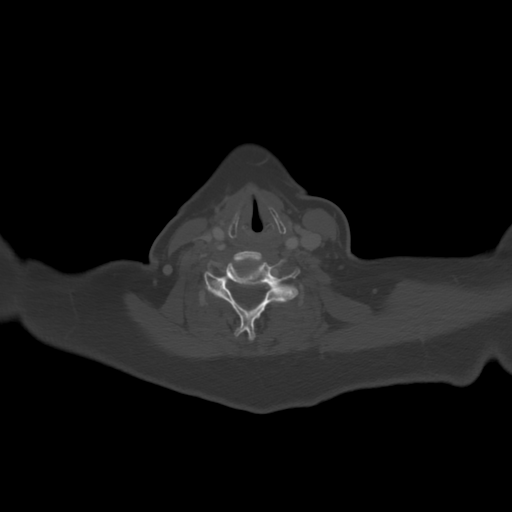
[im 91/122  bone]
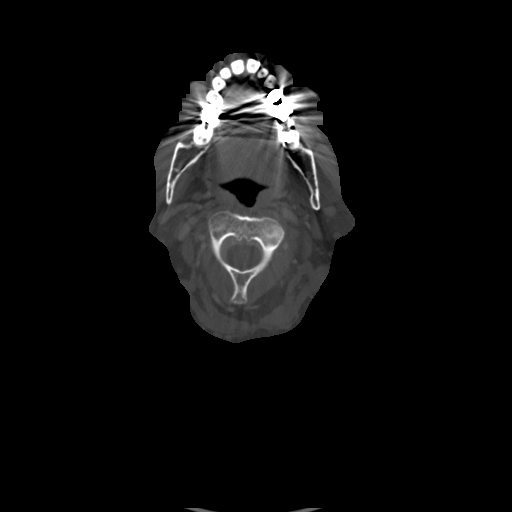

[Series 6: cor neck · coronal · 0.50mm/px · 3 of 114 slices shown]
[im 32/114  bone]
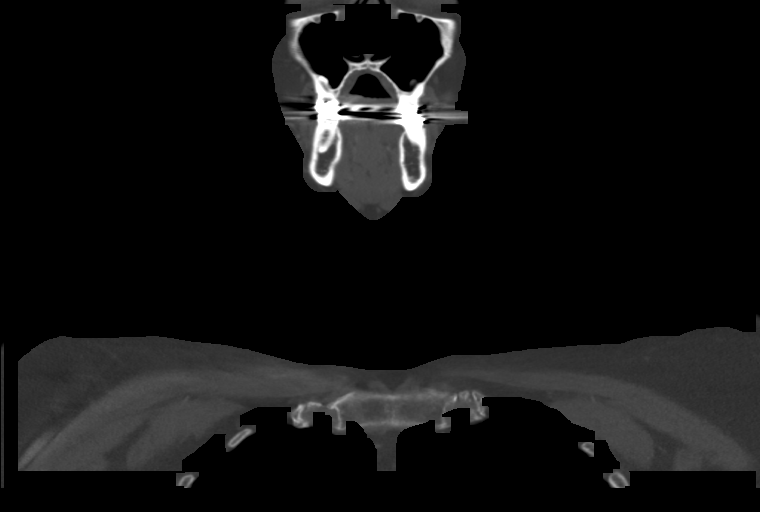
[im 49/114  bone]
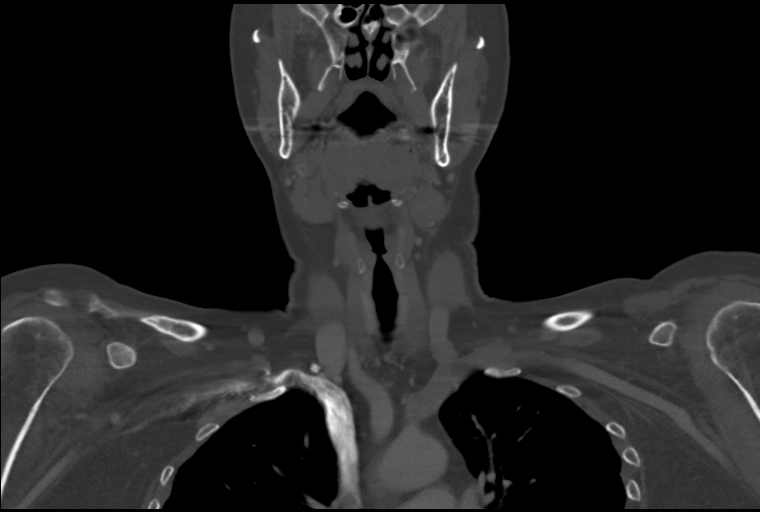
[im 65/114  bone]
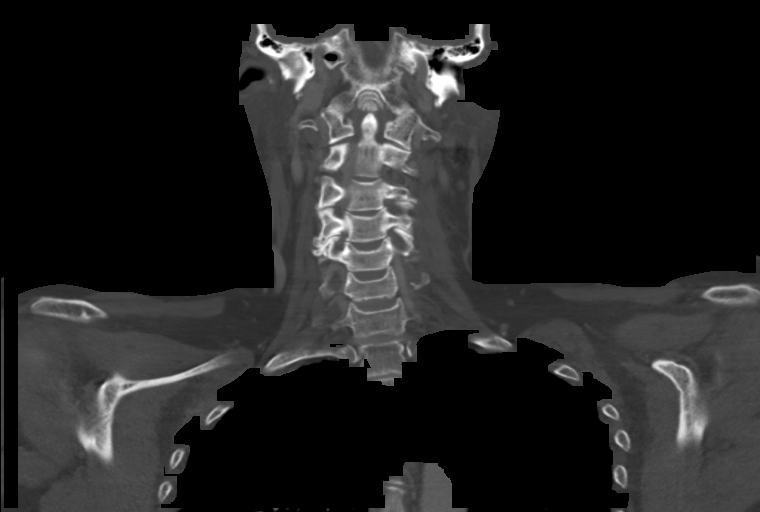

[Series 7: orthogonal ax · axial · 0.43mm/px · z∈[-317,-148]mm · 4 of 150 slices shown, 5 images]
[im 30/150  soft-tissue]
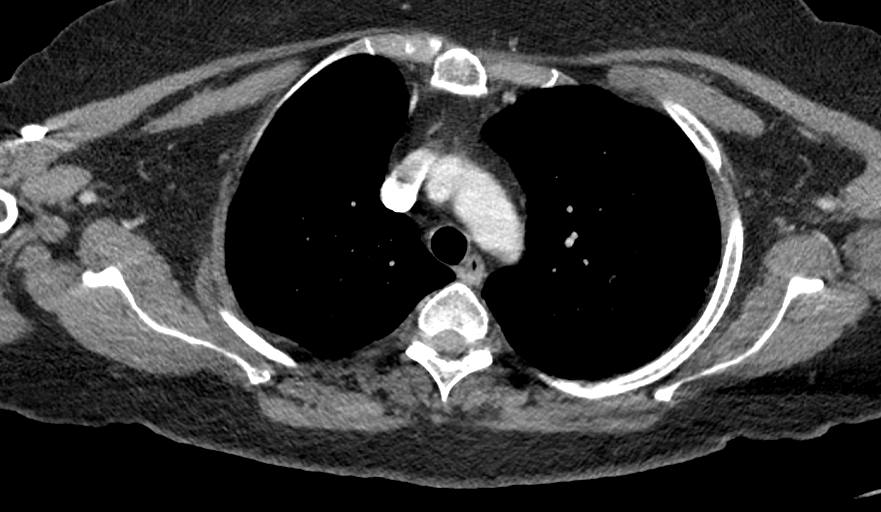
[im 30/150  bone]
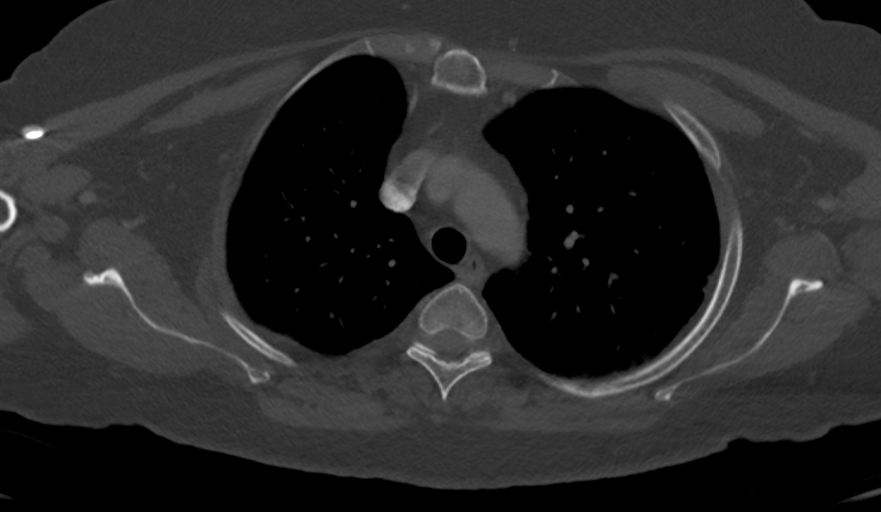
[im 60/150  bone]
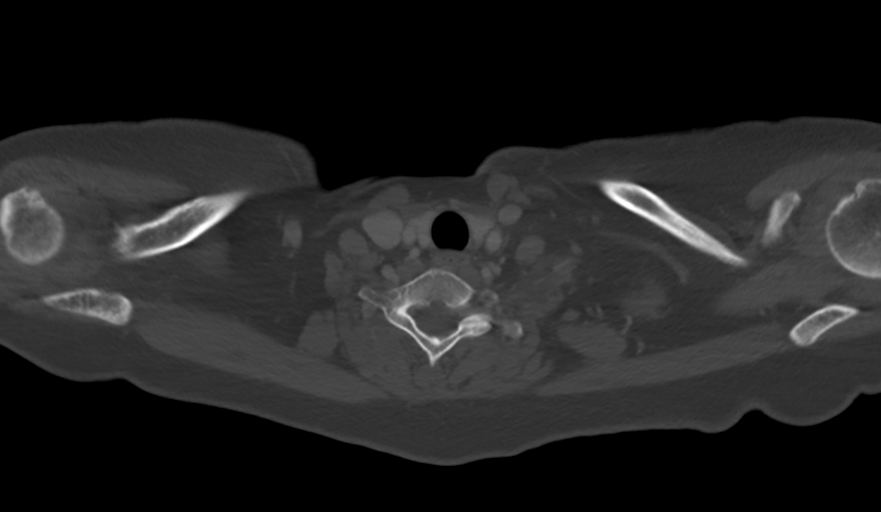
[im 90/150  bone]
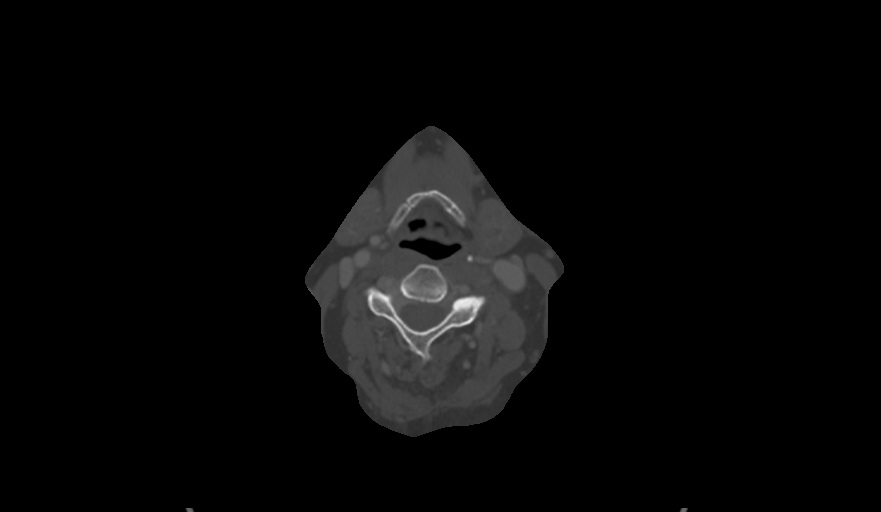
[im 120/150  bone]
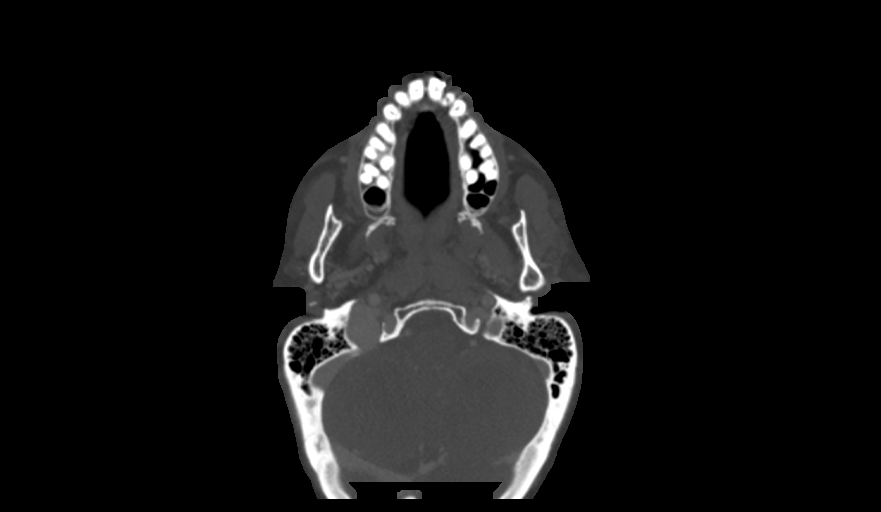

[10 of 20 positions shown; findings below may reference images not displayed]

FINDINGS: Pharynx and larynx: No suspicious asymmetry or enhancement

Salivary glands: Negative

Thyroid: Negative

Lymph nodes: None enlarged or heterogeneous

Vascular: Negative

Limited intracranial: Negative

Visualized orbits: Bilateral cataract resection.  Otherwise negative

Mastoids and visualized paranasal sinuses: Clear

Skeleton: Cervical dextrocurvature with facet spurring. No acute or
aggressive process.

Upper chest: Clear apical lungs. Negative supraclavicular fossa
state.
IMPRESSION: Negative exam.  No explanation for palpable complaint.

## 2016-03-01 MED ORDER — IOPAMIDOL (ISOVUE-300) INJECTION 61%: 75.0000 mL | Freq: Once | INTRAVENOUS | Status: DC | PRN

## 2016-03-27 ENCOUNTER — Ambulatory Visit (INDEPENDENT_AMBULATORY_CARE_PROVIDER_SITE_OTHER): Payer: BLUE CROSS/BLUE SHIELD | Admitting: Internal Medicine

## 2016-03-27 ENCOUNTER — Encounter: Payer: Self-pay | Admitting: Internal Medicine

## 2016-03-27 ENCOUNTER — Encounter (INDEPENDENT_AMBULATORY_CARE_PROVIDER_SITE_OTHER): Payer: Self-pay

## 2016-03-27 DIAGNOSIS — R739 Hyperglycemia, unspecified: Secondary | ICD-10-CM | POA: Diagnosis not present

## 2016-03-27 DIAGNOSIS — R221 Localized swelling, mass and lump, neck: Secondary | ICD-10-CM

## 2016-03-27 DIAGNOSIS — R42 Dizziness and giddiness: Secondary | ICD-10-CM

## 2016-03-27 DIAGNOSIS — R0602 Shortness of breath: Secondary | ICD-10-CM

## 2016-03-27 DIAGNOSIS — E7800 Pure hypercholesterolemia, unspecified: Secondary | ICD-10-CM | POA: Diagnosis not present

## 2016-03-27 NOTE — Progress Notes (Signed)
Patient ID: Lynn Mann, female   DOB: [birth year], 65 y.o.   MRN: [id number]   Subjective:    Patient ID: Lynn Mann, female    DOB: 01/11/1951, 65 y.o.   MRN: [id number]  HPI  Patient here for a scheduled follow up.  She overall feels better.  Neck is better.  She is still having some dizziness and lightheadedness.  Has been intermittently worse.  Worse with certain movements.  Flared again over this past week.  She has also been having some issues with swallowing.  Occasionally will get caught.  Occasional acid reflux.  Still some sob with exertion, but breathing overall better.  She is walking.  She has stopped drinking soft drinks.  No vomiting.  Bowels stable.     Past Medical History:  Diagnosis Date  . Arthritis   . Chicken pox   . Cholecystitis 11/2011   Did not require sgy - Dr. Staci Acosta - [facility]  (cholelithiasis)  . H/O Clostridium difficile infection   . Hypertension   . IBS (irritable bowel syndrome)   . MRSA exposure 2005   Spider bite  . MVP (mitral valve prolapse)    Stable - Dr. Ubaldo Glassing  . Rheumatic fever    Past Surgical History:  Procedure Laterality Date  . CATARACT EXTRACTION  [id number]  . MUSCLE BIOPSY     Family History  Problem Relation Age of Onset  . Arthritis Mother   . Stroke Mother   . Diabetes Mother   . Hypertension Mother   . Arthritis Father   . Stroke Father   . Diabetes Paternal Grandmother   . Cancer Paternal Uncle     colon  . Breast cancer Maternal Aunt   . Heart disease      maternal and paternal side  . Breast cancer Paternal Aunt   . Breast cancer Cousin   . Breast cancer Cousin     female cousin   Social History   Social History  . Marital status: Married    Spouse name: N/A  . Number of children: N/A  . Years of education: 41   Occupational History  . Retired    Social History Main Topics  . Smoking status: Never Smoker  . Smokeless tobacco: Never Used  . Alcohol use 0.0 oz/week     Comment: Rarely  . Drug use:  No  . Sexual activity: Yes    Partners: Male    Birth control/ protection: Post-menopausal   Other Topics Concern  . None   Social History Narrative  . None    Outpatient Encounter Prescriptions as of 03/27/2016  Medication Sig  . sodium chloride (OCEAN) 0.65 % SOLN nasal spray Place 2 sprays into both nostrils every 2 (two) hours while awake.  . albuterol (PROVENTIL HFA;VENTOLIN HFA) 108 (90 Base) MCG/ACT inhaler Inhale 2 puffs into the lungs every 6 (six) hours as needed for wheezing or shortness of breath.  . fluticasone (FLONASE) 50 MCG/ACT nasal spray Place 1 spray into both nostrils 2 (two) times daily.   No facility-administered encounter medications on file as of 03/27/2016.     Review of Systems  Constitutional: Negative for appetite change and unexpected weight change.  HENT: Negative for congestion and sinus pressure.   Respiratory: Positive for cough and shortness of breath. Negative for chest tightness.   Cardiovascular: Negative for chest pain, palpitations and leg swelling.  Gastrointestinal: Negative for abdominal pain, diarrhea, nausea and vomiting.  Genitourinary: Negative for difficulty urinating and  dysuria.  Skin: Negative for color change and rash.  Neurological: Positive for dizziness and light-headedness. Negative for headaches.  Psychiatric/Behavioral: Negative for agitation and dysphoric mood.       Objective:    Physical Exam  Constitutional: She appears well-developed and well-nourished. No distress.  HENT:  Nose: Nose normal.  Mouth/Throat: Oropharynx is clear and moist.  Neck: Neck supple. No thyromegaly present.  Cardiovascular: Normal rate and regular rhythm.   Pulmonary/Chest: Breath sounds normal. No respiratory distress. She has no wheezes.  Abdominal: Soft. Bowel sounds are normal. There is no tenderness.  Musculoskeletal: She exhibits no edema or tenderness.  Lymphadenopathy:    She has no cervical adenopathy.  Skin: No rash noted. No  erythema.  Psychiatric: She has a normal mood and affect. Her behavior is normal.    BP 130/62 (BP Location: Left Arm, Patient Position: Sitting, Cuff Size: Large)   Pulse (!) 108   Temp 98.4 F (36.9 C) (Oral)   Resp 16   Wt 193 lb (87.5 kg)   SpO2 97%   BMI 30.68 kg/m  Wt Readings from Last 3 Encounters:  03/27/16 193 lb (87.5 kg)  02/21/16 195 lb (88.5 kg)  01/23/16 194 lb 8 oz (88.2 kg)     Lab Results  Component Value Date   WBC 5.7 01/23/2016   HGB 13.9 01/23/2016   HCT 41.3 01/23/2016   PLT 291.0 01/23/2016   GLUCOSE 86 01/23/2016   CHOL 197 07/25/2015   TRIG 159.0 (H) 07/25/2015   HDL 41.10 07/25/2015   LDLDIRECT 124.3 01/18/2013   LDLCALC 124 (H) 07/25/2015   ALT 16 01/23/2016   AST 20 01/23/2016   NA 140 01/23/2016   K 4.5 01/23/2016   CL 105 01/23/2016   CREATININE 0.68 01/23/2016   BUN 15 01/23/2016   CO2 30 01/23/2016   TSH 2.39 01/23/2016   HGBA1C 5.8 07/25/2015    Ct Soft Tissue Neck W Contrast  Result Date: 03/01/2016 CLINICAL DATA:  Right neck mass and fullness on physical exam. Asymptomatic. EXAM: CT NECK WITH CONTRAST TECHNIQUE: Multidetector CT imaging of the neck was performed using the standard protocol following the bolus administration of intravenous contrast. CONTRAST:  75 cc Isovue 300 intravenous COMPARISON:  None. FINDINGS: Pharynx and larynx: No suspicious asymmetry or enhancement Salivary glands: Negative Thyroid: Negative Lymph nodes: None enlarged or heterogeneous Vascular: Negative Limited intracranial: Negative Visualized orbits: Bilateral cataract resection.  Otherwise negative Mastoids and visualized paranasal sinuses: Clear Skeleton: Cervical dextrocurvature with facet spurring. No acute or aggressive process. Upper chest: Clear apical lungs. Negative supraclavicular fossa state. IMPRESSION: Negative exam.  No explanation for palpable complaint. Electronically Signed   By: Monte Fantasia M.D.   On: 03/01/2016 13:50         Assessment & Plan:   Problem List Items Addressed This Visit    Dizziness    Dizziness - intermittent - persist.  Reoccurring episodes.  Last episode - this past week.  Will have ENT evaluate given persistent reoccurrence.        Relevant Orders   Ambulatory referral to ENT   Hypercholesterolemia    Low cholesterol diet and exercise.  Follow lipid panel.   Lab Results  Component Value Date   CHOL 197 07/25/2015   HDL 41.10 07/25/2015   LDLCALC 124 (H) 07/25/2015   LDLDIRECT 124.3 01/18/2013   TRIG 159.0 (H) 07/25/2015   CHOLHDL 5 07/25/2015        Hyperglycemia    Low carb diet  and exercise.  Follow met b and a1c.        Neck fullness    Recent CT scan negative.  Neck fullness resolved.  Follow.       SOB (shortness of breath)    Cough and congestion improved.  SOB with exertion.  Have pulmonary evaluate further testing and treatment.        Relevant Orders   Ambulatory referral to Pulmonology    Other Visit Diagnoses   None.      Einar Pheasant, MD

## 2016-04-01 ENCOUNTER — Encounter: Payer: Self-pay | Admitting: Internal Medicine

## 2016-04-01 DIAGNOSIS — R0602 Shortness of breath: Secondary | ICD-10-CM | POA: Insufficient documentation

## 2016-04-01 DIAGNOSIS — R42 Dizziness and giddiness: Secondary | ICD-10-CM | POA: Insufficient documentation

## 2016-04-01 NOTE — Assessment & Plan Note (Signed)
Recent CT scan negative.  Neck fullness resolved.  Follow.

## 2016-04-01 NOTE — Assessment & Plan Note (Signed)
Cough and congestion improved.  SOB with exertion.  Have pulmonary evaluate further testing and treatment.

## 2016-04-01 NOTE — Assessment & Plan Note (Signed)
Low carb diet and exercise.  Follow met b and a1c.   

## 2016-04-01 NOTE — Assessment & Plan Note (Signed)
Low cholesterol diet and exercise.  Follow lipid panel.   Lab Results  Component Value Date   CHOL 197 07/25/2015   HDL 41.10 07/25/2015   LDLCALC 124 (H) 07/25/2015   LDLDIRECT 124.3 01/18/2013   TRIG 159.0 (H) 07/25/2015   CHOLHDL 5 07/25/2015

## 2016-04-01 NOTE — Assessment & Plan Note (Signed)
Dizziness - intermittent - persist.  Reoccurring episodes.  Last episode - this past week.  Will have ENT evaluate given persistent reoccurrence.

## 2016-04-05 ENCOUNTER — Encounter: Payer: Self-pay | Admitting: Internal Medicine

## 2016-04-09 ENCOUNTER — Encounter: Payer: Self-pay | Admitting: Pulmonary Disease

## 2016-04-09 ENCOUNTER — Ambulatory Visit (INDEPENDENT_AMBULATORY_CARE_PROVIDER_SITE_OTHER): Payer: BLUE CROSS/BLUE SHIELD | Admitting: Pulmonary Disease

## 2016-04-09 VITALS — BP 146/80 | HR 75 | Ht 67.0 in | Wt 197.2 lb

## 2016-04-09 DIAGNOSIS — R0600 Dyspnea, unspecified: Secondary | ICD-10-CM | POA: Diagnosis not present

## 2016-04-09 MED ORDER — FLUTICASONE FUROATE-VILANTEROL 100-25 MCG/INH IN AEPB: 1.0000 | INHALATION_SPRAY | Freq: Every day | RESPIRATORY_TRACT | 5 refills | Status: DC

## 2016-04-09 MED ORDER — FLUTICASONE FUROATE-VILANTEROL 100-25 MCG/INH IN AEPB: 1.0000 | INHALATION_SPRAY | Freq: Every day | RESPIRATORY_TRACT | 0 refills | Status: AC

## 2016-04-09 NOTE — Patient Instructions (Signed)
1) Begin trial of Breo inhaler - one inhalation daily 2) Continue albuterol inhaler - 1 or 2 puffs as needed up to 4 times per day 3) return in 4-6 weeks with lung function tests prior to that visit

## 2016-04-09 NOTE — Progress Notes (Signed)
Patient ID: Lynn Mann, female   DOB: 04/25/1951, 65 y.o.   MRN: [id number] Patient seen in the office today and instructed on use of BREO ELLIPTA.  Patient expressed understanding and demonstrated technique.

## 2016-04-11 ENCOUNTER — Telehealth: Payer: Self-pay | Admitting: Pulmonary Disease

## 2016-04-11 NOTE — Telephone Encounter (Signed)
Pt aware that she does not have to exhale through her nose after inhaling her inhaler, pt advised to inhale her inhaler and exhale through mouth.  Pt concerned about when she breathes through her nose and out through her mouth she gets dizzy and her ears ring, and when pt breathes through her mouth and out through her nose her ears "feel like they hot water in them"  DS please advise. Thanks

## 2016-04-11 NOTE — Progress Notes (Signed)
PULMONARY CONSULT NOTE  Requesting MD/Service: Einar Pheasant, MD Date of initial consultation: 04/09/16 Reason for consultation: Dyspnea  PT PROFILE: 7 F never smoker referred for evaluation of episodic X 8-9 months  HPI:  66 F referred for evaluation of episodic dyspnea dating back to last winter. She reports that she was "sick" all winter with respiratory tract infections. She was again diagnosed with "bronchitis" in Mar and May of this year. Her history is a little unfocussed and she also reports a multitude of other symptoms and problems including vertigo (reportedly "passing out" on one occasion) and hypotension. She has seen multiple care providers over these past several months and has received antibiotics on at least 2 occasions and prednisone on one occasion. She believes that these therapies helped her symptoms. On one occasion, she was treated in an urgent care center with nebulized albuterol which was beneficial. She has purchased a pulse oximeter and reports that sometimes her SpO2 is in the low 90s and as low as 89%. She has seen cardiology and undergone a thorough evaluation including echocardiogram and Holter monitor which were normal. She is scheduled to see ENT for upper airway symptoms.   At the present time she continues to experience persistent but variable DOE, fatigue and chest heaviness. She denies CP, fever, purulent sputum, hemoptysis, LE edema and calf tenderness. She has an albuterol MDI which she uses occasionally and finds beneficial "sometimes".    Past Medical History:  Diagnosis Date  . Arthritis   . Chicken pox   . Cholecystitis 11/2011   Did not require sgy - Dr. Staci Acosta - [facility]  (cholelithiasis)  . H/O Clostridium difficile infection   . IBS (irritable bowel syndrome)   . MRSA exposure 2005   Spider bite  . MVP (mitral valve prolapse)    Stable - Dr. Ubaldo Glassing  . Rheumatic fever     Past Surgical History:  Procedure Laterality Date  . CATARACT  EXTRACTION  [id number]  . MUSCLE BIOPSY      MEDICATIONS: I have reviewed all medications and confirmed regimen as documented  Social History   Social History  . Marital status: Married    Spouse name: N/A  . Number of children: N/A  . Years of education: 63   Occupational History  . Retired    Social History Main Topics  . Smoking status: Never Smoker  . Smokeless tobacco: Never Used  . Alcohol use 0.0 oz/week     Comment: Rarely  . Drug use: No  . Sexual activity: Yes    Partners: Male    Birth control/ protection: Post-menopausal   Other Topics Concern  . Not on file   Social History Narrative  . No narrative on file    Family History  Problem Relation Age of Onset  . Arthritis Mother   . Stroke Mother   . Diabetes Mother   . Hypertension Mother   . Arthritis Father   . Stroke Father   . Diabetes Paternal Grandmother   . Cancer Paternal Uncle     colon  . Breast cancer Maternal Aunt   . Heart disease      maternal and paternal side  . Breast cancer Paternal Aunt   . Breast cancer Cousin   . Breast cancer Cousin     female cousin    ROS: No fever, myalgias/arthralgias, unexplained weight loss or weight gain No new focal weakness or sensory deficits No otalgia, hearing loss, visual changes, nasal and sinus symptoms, mouth  and throat problems No neck pain or adenopathy No abdominal pain, N/V/D, diarrhea, change in bowel pattern No dysuria, change in urinary pattern   Vitals:   04/09/16 1118  BP: (!) 146/80  Pulse: 75  SpO2: 96%  Weight: 197 lb 3.2 oz (89.4 kg)  Height: 5\' 7"  (1.702 m)     EXAM:  Gen: WDWN, No overt respiratory distress HEENT: NCAT, sclera white, oropharynx normal Neck: Supple without LAN, thyromegaly, JVD Lungs: breath sounds: slightly coarse, percussion: normal, No wheezes Cardiovascular: RRR, no murmurs noted Abdomen: Moderately obese, soft, nontender, normal BS Ext: without clubbing, cyanosis, edema Neuro: CNs grossly  intact, motor and sensory intact Skin: Limited exam, no lesions noted  DATA:   BMP Latest Ref Rng & Units 01/23/2016 07/25/2015 10/05/2014  Glucose 70 - 99 mg/dL 86 107(H) 89  BUN 6 - 23 mg/dL 15 13 11   Creatinine 0.40 - 1.20 mg/dL 0.68 0.68 0.63  Sodium 135 - 145 mEq/L 140 140 141  Potassium 3.5 - 5.1 mEq/L 4.5 4.2 4.7  Chloride 96 - 112 mEq/L 105 105 106  CO2 19 - 32 mEq/L 30 28 30   Calcium 8.4 - 10.5 mg/dL 9.6 9.6 9.7    CBC Latest Ref Rng & Units 01/23/2016 07/25/2015 10/05/2014  WBC 4.0 - 10.5 K/uL 5.7 5.6 5.5  Hemoglobin 12.0 - 15.0 g/dL 13.9 14.1 14.1  Hematocrit 36.0 - 46.0 % 41.3 42.5 41.4  Platelets 150.0 - 400.0 K/uL 291.0 300.0 275.0    CXR (12/16/15): [facility]    IMPRESSION:     ICD-9-CM ICD-10-CM   1. Dyspnea 786.09 R06.00 Pulmonary function test   Recurrent "bronchitis", episodic and variable dyspnea with favorable response to prednisone and albuterol suggests asthma or something like it  PLAN:  1) Begin trial of Breo inhaler - one inhalation daily 2) Continue albuterol inhaler - 1 or 2 puffs as needed up to 4 times per day 3) return in 4-6 weeks with lung function tests prior to that visit   Merton Border, MD PCCM service Mobile [phone] Pager [phone] 04/11/2016

## 2016-04-11 NOTE — Telephone Encounter (Signed)
Pt is having trouble using her inhaler. Pt states she has a hard time breathing out through her nose and breathing out through her nose. She is having difficulty breathing out of her mouth. Please call.

## 2016-04-15 NOTE — Telephone Encounter (Signed)
We can discuss these things @ next visit. For now, would simply avoid breathing in those ways (in through nose and out through mouth or vice versa)  Lynn Mann

## 2016-04-15 NOTE — Telephone Encounter (Signed)
LMOVM Will await call back.  

## 2016-04-15 NOTE — Telephone Encounter (Signed)
Pt aware of DS recommendations. Pt voiced understanding. nothing further needed.

## 2016-05-07 ENCOUNTER — Encounter: Payer: Self-pay | Admitting: Physical Therapy

## 2016-05-07 ENCOUNTER — Ambulatory Visit: Payer: BLUE CROSS/BLUE SHIELD | Attending: Otolaryngology | Admitting: Physical Therapy

## 2016-05-07 DIAGNOSIS — R42 Dizziness and giddiness: Secondary | ICD-10-CM | POA: Insufficient documentation

## 2016-05-07 NOTE — Therapy (Signed)
[place] MAIN [facility] SERVICES [address] [place], [place], [address] Phone: [phone]   Fax:  [phone]  Physical Therapy Evaluation  Patient Details  Name: Lynn Mann MRN: [id number] Date of Birth: Jan 20, 1951 Referring Provider: Dr. Carmin Richmond  Encounter Date: 05/07/2016      PT End of Session - 05/07/16 1327    Visit Number 1   Number of Visits 9   Date for PT Re-Evaluation 07/02/16   PT Start Time 0128   PT Stop Time 0240   PT Time Calculation (min) 72 min   Equipment Utilized During Treatment Gait belt   Activity Tolerance Patient tolerated treatment well   Behavior During Therapy [facility] for tasks assessed/performed      Past Medical History:  Diagnosis Date  . Arthritis   . Chicken pox   . Cholecystitis 11/2011   Did not require sgy - Dr. Staci Acosta - [facility]  (cholelithiasis)  . H/O Clostridium difficile infection   . IBS (irritable bowel syndrome)   . MRSA exposure 2005   Spider bite  . MVP (mitral valve prolapse)    Stable - Dr. Ubaldo Glassing  . Rheumatic fever     Past Surgical History:  Procedure Laterality Date  . CATARACT EXTRACTION  [id number]  . MUSCLE BIOPSY      There were no vitals filed for this visit.       Subjective Assessment - 05/07/16 1327    Subjective Patient states that she feels her symptoms have improved since onset in May and that last episode was 3 weeks ago.    Pertinent History Patient reports that she began to have dizziness issues in May 2017. Patient states a week before this episode she had bronchitis and fluid in her ears and she was put on steroids. Pt states she had finisher her steroids on Thursday. Pt states she got "seasick" after she drove with her husband to [facility] on Friday. Pt states she felt her "stomach drop" like on an amusement park ride and then "colors were swirling in front of my face". Pt states her husband said she passed out and fell. Patient does not remember falling  or passing out. Patient states she felt there was a "glass circle" around her vision and that her vision looked like she was looking through an old rippled window. Patient states EMS was called and evaluated her, but she did not go to the hospital. Patient states that she did not go to the hospital, but went to an acute care place for care. Pt states that she was put on a Z pack as she still had fluid in the ears. Pt states the a week and a half later she woke up with dizziness and went to the [facility]. Patient said she had a CT head scan of brain which was normal. Patient was referred to a cardiologist.Patient states she had halter monitor, EKG and echocardiogram testing and patient reports all tests were fine. Dr. Nicki Reaper referred patient to ENT and pulmonologist phyisicians. Patient has been seen by ENT and had VNG testing. Per MR, VNG testing revealed mild left sided deficits. Patient reports she has pulmonary function tests ordered for mid October. Patient reports that over 20 years ago she double vision and describes possible convergence issues. Pt states that they tried prisms in her glasses but patient states that did not help the problem. Pt states she felt the issue resolved with time on its own. Pt  reports that she had labyrinthitis in her 53s. Patient states she did not have PT at that time and states she "bounced back pretty well" from that and it only bothered her for about 1 week.             [facility] PT Assessment - 05/07/16 1419      Assessment   Medical Diagnosis dizziness   Referring Provider Dr. Loletha Grayer Vaught   Onset Date/Surgical Date 05/026/17   Prior Therapy no prior vestibular PT     Precautions   Precautions None     Restrictions   Weight Bearing Restrictions No     Balance Screen   Has the patient fallen in the past 6 months Yes   How many times? 1   Has the patient had a decrease in activity level because of a fear of falling?  No   Is the patient reluctant to  leave their home because of a fear of falling?  No     Home Ecologist residence   Living Arrangements Spouse/significant other   Available Help at Discharge Family   Type of Susan Moore to enter   Entrance Stairs-Number of Steps 1   Entrance Stairs-Rails None   Home Layout One level  one step without rail to sunken living room     Standardized Balance Assessment   Standardized Balance Assessment Dynamic Gait Index     Dynamic Gait Index   Level Surface Normal   Change in Gait Speed Normal   Gait with Horizontal Head Turns Mild Impairment   Gait with Vertical Head Turns Mild Impairment   Gait and Pivot Turn Normal   Step Over Obstacle Normal   Step Around Obstacles Normal   Steps Mild Impairment   Total Score 21       VESTIBULAR AND BALANCE EVALUATION  Onset Date: May 2017  HISTORY: Patient reports that she began to have dizziness issues in May 2017. Patient states a week before this episode she had bronchitis and fluid in her ears and she was put on steroids. Pt states she had finisher her steroids on Thursday. Pt states she got "seasick" after she drove with her husband to [facility] on Friday. Pt states she felt her "stomach drop" like on an amusement park ride and then "colors were swirling in front of my face". Pt states her husband said she passed out and fell. Patient does not remember falling or passing out. Patient states she felt there was a "glass circle" around her vision and that her vision looked like she was looking through an old rippled window. Patient states EMS was called and evaluated her, but she did not go to the hospital. Patient states that she did not go to the hospital, but went to an acute care place for care. Pt states that she was put on a Z pack as she still had fluid in the ears. Pt states the a week and a half later she woke up with dizziness and went to the [facility]. Patient said she  had a CT head scan of brain which was normal. Patient was referred to a cardiologist.Patient states she had halter monitor, EKG and echocardiogram testing and patient reports all tests were fine. Dr. Nicki Reaper referred patient to ENT and pulmonologist physicians. Patient has been seen by ENT and had VNG testing. Per MR, VNG testing revealed mild left sided deficits. Patient reports she has pulmonary  function tests ordered for mid October. Patient reports that over 20 years ago she double vision and describes possible convergence issues. Pt states that they tried prisms in her glasses but patient states that did not help the problem. Pt states she felt the issue resolved with time on its own. Pt reports that she had labyrinthitis in her 30's. Patient states she did not have PT at that time and states she "bounced back pretty well" from that and it only bothered her for about 1 week.    Description of dizziness: pt reports unsteadiness, "like walking with magnifying glasses on your eyes so you're not quite sure about moving in the environment". Patient states she has had a couple of mornings where she had dizziness similar to her experience in May where it was "like you are looking through a glass full of water", things look "glassy" and experienced some imbalance.  Patient describes dizziness as general unsteadiness, aural fullness. Pt denies vertigo. Frequency: Patient had it 3 weeks ago one morning. Pt reports the glassiness comes and goes- and usually happens on hot days. Pt states she experienced the glassiness most on hot, sunny days from 11-12 when she was walking through parking lots over the summer; unable to report  Duration: patient reports the episode 3 weeks ago lasted 45 min to 1 hour  Symptom nature: spontaneous, variable, intermittent   Provocative Factors: being outside in warm environment, bending over, walking down steps states she touches walls when walking down hall at times,  inclines Easing Factors: pt unable to report  Progression of symptoms: better History of similar episodes: none  Falls (yes/no): yes Number of falls in past 6 months: 1 in May  Prior Functional Level: independent community ambulator without AD, drives, independent with ADLs.   Auditory complaints (tinnitus, pain, drainage): tinnitus increase in recent weeks in mostly the left ear. Patient reports itching in ears but not pain or drainage. Patient states for years she has felt in the morning water in the ears and she just turns over on each side before getting out of bed and this helps drain.  Vision (last eye exam, diplopia, recent changes): denies. Pt states she has chronic floaters. Pt states she has had her eyes examined a few weeks ago twice. Pt uses over the counter eye drops for dry eyes.   Current Symptoms: denies dysarthria, dysphagia, bowel and bladder changes, recent weight loss/gain. Review of systems negative for red flags.   EXAMINATION  POSTURE:  Rounded shoulders      COORDINATION: Finger to Nose:  normal Past Pointing:    mild pass pointing bilaterally   MUSCULOSKELETAL SCREEN: Cervical Spine ROM: Cervical AROM WFL flexion,extension and R/L rotation. Pt does report mild discomfort with left cervical rotation and points to lateral upper cervical spine/upper trap area.   ROM: WFL  Functional Mobility: independent sit to stand  Gait: Pt arrives ambulating without AD. Patient ambulates with fair cadence with good arm swing and step through gait pattern.  Scanning of visual environment with gait is: fair  Balance: Patient demonstrates difficulty with EC, compliant surfaces, narrow BOS and activities with head turns.   POSTURAL CONTROL TESTS:   Clinical Test of Sensory Interaction for Balance    (CTSIB):  CONDITION TIME STRATEGY SWAY  Eyes open, firm surface 30 seconds    Eyes closed, firm surface 30 seconds Ankle, hip +3  Eyes open, foam surface 30 seconds  ankle +2  Eyes closed, foam surface 6 seconds Ankle, hip +4  OCULOMOTOR / VESTIBULAR TESTING:  Convergence left eye does not converge as much as right eye. Converges within 4" from nose which is normal however.   Oculomotor Exam- Room Light  Normal Abnormal Comments  Ocular Alignment N    Ocular ROM N    Spontaneous Nystagmus N    End-Gaze Nystagmus N  Age appropriate at end gaze bilaterally  Smooth Pursuit N  Few corrective saccades at end range gaze L and R fields which could be age appropriate  Saccades N    VOR  Abn Slight motion sensitivity feeling and feels ears feel like they are draining on the inside after performing  VOR Cancellation N  Mild queasiness  Left Head Thrust   Deferred secondary to history of cervical bone spurs and mild discomfort with AROM; pt had VNG testing which demonstrated mild left sided deficits per MR.   Right Head Thrust   Deferred secondary to history of cervical bone spurs; pt had VNG testing which found mild left sided deficits per MR.      FUNCTIONAL OUTCOME MEASURES:  Results Comments  DHI 22 Low perception of handicap  ABC Scale 74% Falls risk; in need of intervention  DGI 21/24 Mild impairment; in need of intervention   Neuromuscular Re-education:  VOR x 1 exercise: Demonstrated and discussed VOR X 1 exercise. In sitting, pt performed VOR X 1 horiz 1 rep of 30 seconds and then 3 reps of 1 minute each. Pt required cuing for technique as patient initially performing with increased speed with minimal head rotation, but improved with cuing. In addition, patient reporting that the X on the paper appeared to form a second piece of paper that was overlaid and tilting towards her which improved after cuing to slow head movements to where no blurring or jumping of target; however, pt continued to report intermittent "tilting" of target at times.         PT Education - 05/07/16 1327    Education provided Yes   Education Details VOR X 1  exercise; POC   Person(s) Educated Patient   Methods Explanation;Demonstration;Verbal cues;Handout   Comprehension Verbalized understanding;Returned demonstration;Verbal cues required             PT Long Term Goals - 05/07/16 1528      PT LONG TERM GOAL #1   Title Patient will be able to perform home program independently for self-management by 07/02/16.   Time 8   Period Weeks   Status New     PT LONG TERM GOAL #2   Title Patient will have demonstrate decreased falls risk as indicated by Activities Specific Balance Confidence Scale score of 80% or greater by 07/02/16.   Baseline scored 74% on 05/07/16;    Time 8   Period Weeks   Status New     PT LONG TERM GOAL #3   Title Patient will report 50% or greater improvement in her symptoms of dizziness and imbalance with provoking motions or positions by 07/02/16.   Time 8   Period Weeks   Status New     PT LONG TERM GOAL #4   Title Patient will report 3/10 dizziness or less with ambulating up/down stairs and inclines in order to be able to move about the community safely by 07/02/2016.   Time 8   Period Weeks   Status New               Plan - 05/07/16 1525    Clinical Impression Statement Patient  presents with listed functional deficits and impairments. Patient with abnormal VOR testing noted consistent with VNG findings per MR of mild left sided deficits. Patient demonstrates difficulty with EC, uneven surfaces, activities with narrow base of support and head turns. Patient would benefit from PT services to address goals as set on POC, to address deficits, to try to decrease falls risk, improve balance and to try to decrease dizziness symptoms.     Rehab Potential Good   Clinical Impairments Affecting Rehab Potential Positive Indicators: motivated, family support, symptoms have been improving since onset   Negative Indicators: history of motion sensitivity   PT Frequency 1x / week   PT Duration 8 weeks   PT  Treatment/Interventions Canalith Repostioning;Neuromuscular re-education;Balance training;Patient/family education;Vestibular;Therapeutic activities;Therapeutic exercise   PT Next Visit Plan Review and progress VOR X 1 as able, amb with head turns, foam semi-tandem stance with head turns and body turns   PT Home Exercise Plan VOR X 1   Consulted and Agree with Plan of Care Patient      Patient will benefit from skilled therapeutic intervention in order to improve the following deficits and impairments:  Decreased balance, Dizziness  Visit Diagnosis: Dizziness and giddiness     Problem List Patient Active Problem List   Diagnosis Date Noted  . Dizziness 04/01/2016  . SOB (shortness of breath) 04/01/2016  . Bilateral shoulder pain 02/24/2016  . Fatigue 01/28/2016  . Neck fullness 01/28/2016  . Routine general medical examination at a health care facility 07/25/2015  . Health care maintenance 10/09/2014  . Foot pain 06/19/2014  . Diarrhea 04/08/2014  . Hyperglycemia 02/07/2014  . Neck pain 11/26/2013  . Hypercholesterolemia 11/26/2013  . History of colonic polyps 05/11/2013  . Hyperbilirubinemia 01/18/2013  . GERD (gastroesophageal reflux disease) 12/03/2012  . Pneumonia 11/27/2012  . Cholelithiasis 11/07/2012  . IBS (irritable bowel syndrome) 11/07/2012  . History of rheumatic fever 08/28/2012  . MVP (mitral valve prolapse) 08/28/2012   Lady Deutscher PT, DPT Mahi Zabriskie 05/08/2016, 10:34 AM  [place] MAIN [facility] SERVICES [address] [place], [place], [address] Phone: [phone]   Fax:  [phone]  Name: Lynn Mann MRN: [id number] Date of Birth: 08-07-1950

## 2016-05-14 ENCOUNTER — Encounter: Payer: BLUE CROSS/BLUE SHIELD | Admitting: Physical Therapy

## 2016-05-15 ENCOUNTER — Encounter: Payer: Self-pay | Admitting: Physical Therapy

## 2016-05-15 ENCOUNTER — Ambulatory Visit: Payer: BLUE CROSS/BLUE SHIELD

## 2016-05-15 VITALS — BP 112/52 | HR 78

## 2016-05-15 DIAGNOSIS — R42 Dizziness and giddiness: Secondary | ICD-10-CM | POA: Diagnosis not present

## 2016-05-15 NOTE — Patient Instructions (Signed)
Feet Heel-Toe "Tandem" (Compliant Surface) Head Motion - Eyes Open    With eyes open, standing on compliant surface place right foot directly in front of the other but slightly out to the side, move head slowly: left and right. Perform for 30 seconds. Repeat __3__ times total per session. Do _2___ sessions per day.

## 2016-05-15 NOTE — Therapy (Signed)
[place] MAIN [facility] SERVICES [address] [place], [place], [address] Phone: [phone]   Fax:  [phone]  Physical Therapy Treatment  Patient Details  Name: Lynn Mann MRN: [id number] Date of Birth: Oct 20, 1950 Referring Provider: Dr. Carmin Richmond  Encounter Date: 05/15/2016      PT End of Session - 05/15/16 0812    Visit Number 2   Number of Visits 9   Date for PT Re-Evaluation 07/02/16   PT Start Time 0815   PT Stop Time 0905   PT Time Calculation (min) 50 min   Equipment Utilized During Treatment Gait belt   Activity Tolerance Patient tolerated treatment well   Behavior During Therapy [facility] for tasks assessed/performed      Past Medical History:  Diagnosis Date  . Arthritis   . Chicken pox   . Cholecystitis 11/2011   Did not require sgy - Dr. Staci Acosta - [facility]  (cholelithiasis)  . H/O Clostridium difficile infection   . IBS (irritable bowel syndrome)   . MRSA exposure 2005   Spider bite  . MVP (mitral valve prolapse)    Stable - Dr. Ubaldo Glassing  . Rheumatic fever     Past Surgical History:  Procedure Laterality Date  . CATARACT EXTRACTION  [id number]  . MUSCLE BIOPSY      Vitals:   05/15/16 0818  BP: (!) 112/52  Pulse: 78  SpO2: 98%        Subjective Assessment - 05/15/16 0811    Subjective Pt reports she is doing well on this date. She denies any further episodes of dizziness. She is performing HEP but states that she has to move her head very slowly before the post-it note splits/doubles. Pt reports that she has intraocular lenses and has a history of intermittent double vision. She had prisms in her glasses over 20 years ago for double vision but does not currently wear any prism lenses. Her most recent vision check was a couple weeks ago and no changes were suggested at that time.    Pertinent History Patient reports that she began to have dizziness issues in May 2017. Patient states a week before this episode she had  bronchitis and fluid in her ears and she was put on steroids. Pt states she had finisher her steroids on Thursday. Pt states she got "seasick" after she drove with her husband to [facility] on Friday. Pt states she felt her "stomach drop" like on an amusement park ride and then "colors were swirling in front of my face". Pt states her husband said she passed out and fell. Patient does not remember falling or passing out. Patient states she felt there was a "glass circle" around her vision and that her vision looked like she was looking through an old rippled window. Patient states EMS was called and evaluated her, but she did not go to the hospital. Patient states that she did not go to the hospital, but went to an acute care place for care. Pt states that she was put on a Z pack as she still had fluid in the ears. Pt states the a week and a half later she woke up with dizziness and went to the [facility]. Patient said she had a CT head scan of brain which was normal. Patient was referred to a cardiologist.Patient states she had halter monitor, EKG and echocardiogram testing and patient reports all tests were fine. Dr. Nicki Reaper referred patient to ENT and pulmonologist  phyisicians. Patient has been seen by ENT and had VNG testing. Per MR, VNG testing revealed mild left sided deficits. Patient reports she has pulmonary function tests ordered for mid October. Patient reports that over 20 years ago she double vision and describes possible convergence issues. Pt states that they tried prisms in her glasses but patient states that did not help the problem. Pt states she felt the issue resolved with time on its own. Pt reports that she had labyrinthitis in her 56s. Patient states she did not have PT at that time and states she "bounced back pretty well" from that and it only bothered her for about 1 week.        TREATMENT  NEUROMUSCULAR RE-EDUCATION  VOR X 1 VOR x 1 in sitting x 60 seconds,  extensive discussion regarding doubling of her vision with exercise and history of double vision in the past; VOR x 1 horizontal in standing with feet together 60 seconds x 2, vertical x 60 seconds without doubling of vision;  Hallway Ambulation Forward ambulation with horizontal head turns upon command to read playing cards on walls 75' x 2; Forward ambulation with horizontal ball pass between hands with head and eye follow x 75' Forward ambulation with vertical ball toss with head and eye follow x 75'  Semitandem Progressions Performed semi-tandem progressions on Airex pad with horizontal head turns alternating LE forward x 60 seconds each (issued to HEP)  Horizontal Saccades Seated horizontal, vertical, and diagonal saccades x 30 seconds each (issued to HEP)   Written HEP handout provided with education regarding safe performance at home;                        PT Education - 05/15/16 0812    Education provided Yes   Education Details HEP progression   Person(s) Educated Patient   Methods Explanation;Demonstration;Tactile cues;Verbal cues;Handout   Comprehension Verbalized understanding;Returned demonstration             PT Long Term Goals - 05/07/16 1528      PT LONG TERM GOAL #1   Title Patient will be able to perform home program independently for self-management by 07/02/16.   Time 8   Period Weeks   Status New     PT LONG TERM GOAL #2   Title Patient will have demonstrate decreased falls risk as indicated by Activities Specific Balance Confidence Scale score of 80% or greater by 07/02/16.   Baseline scored 74% on 05/07/16;    Time 8   Period Weeks   Status New     PT LONG TERM GOAL #3   Title Patient will report 50% or greater improvement in her symptoms of dizziness and imbalance with provoking motions or positions by 07/02/16.   Time 8   Period Weeks   Status New     PT LONG TERM GOAL #4   Title Patient will report 3/10 dizziness or less  with ambulating up/down stairs and inclines in order to be able to move about the community safely by 07/02/2016.   Time 8   Period Weeks   Status New               Plan - 05/15/16 [id number]    Clinical Impression Statement Pt provides unique descriptions of her visual symptoms as well as her dizziness which are difficult to interpret. She continues to report doubling of her target object with L head turns regardless of how slowly she moves  her head. Pt has a history of double vision in the past which was corrected with prism lenses over 20 years ago. She had not noticed this issue with doubling until coming in for vestibular therapy. Encouraged pt to talk to her opthamologist who may recommend corrective lenses. Attempted VOR x 1 vertical with patient and she reports no further doubling of her vision. Pt also reports that she used to love to read but does not currently read much due to being farsighted and reading making her "uncomfortable" even with her reading glasses. Pt unable to expound further regarding what "uncomfortable" sensation she is having. Issued saccade exercise for home in hopes of helping her with visual tracking while reading. Pt encouraged to also initiate semitandem exercises with head turns. Follow-up as scheduled.    Rehab Potential Good   Clinical Impairments Affecting Rehab Potential Positive Indicators: motivated, family support, symptoms have been improving since onset   Negative Indicators: history of motion sensitivity   PT Frequency 1x / week   PT Duration 8 weeks   PT Treatment/Interventions Canalith Repostioning;Neuromuscular re-education;Balance training;Patient/family education;Vestibular;Therapeutic activities;Therapeutic exercise   PT Next Visit Plan Progress VOR x 1 as able including vertical, continue semitandem progression and initiate body turns, continue ambulation with head turns.   PT Home Exercise Plan VOR X 1 in standing with feet together horizontal 50%  and vertical 50% of the time, semitandem compliant surface horizontal head turns, seated horizontal/vertical/diagonal saccades.   Consulted and Agree with Plan of Care Patient      Patient will benefit from skilled therapeutic intervention in order to improve the following deficits and impairments:  Decreased balance, Dizziness  Visit Diagnosis: Dizziness and giddiness     Problem List Patient Active Problem List   Diagnosis Date Noted  . Dizziness 04/01/2016  . SOB (shortness of breath) 04/01/2016  . Bilateral shoulder pain 02/24/2016  . Fatigue 01/28/2016  . Neck fullness 01/28/2016  . Routine general medical examination at a health care facility 07/25/2015  . Health care maintenance 10/09/2014  . Foot pain 06/19/2014  . Diarrhea 04/08/2014  . Hyperglycemia 02/07/2014  . Neck pain 11/26/2013  . Hypercholesterolemia 11/26/2013  . History of colonic polyps 05/11/2013  . Hyperbilirubinemia 01/18/2013  . GERD (gastroesophageal reflux disease) 12/03/2012  . Pneumonia 11/27/2012  . Cholelithiasis 11/07/2012  . IBS (irritable bowel syndrome) 11/07/2012  . History of rheumatic fever 08/28/2012  . MVP (mitral valve prolapse) 08/28/2012    Andrej Spagnoli 05/15/2016, 9:36 AM  [place] MAIN [facility] SERVICES [address] [place], [place], [address] Phone: [phone]   Fax:  [phone]  Name: Lynn Mann MRN: [id number] Date of Birth: 10/01/1950

## 2016-05-16 ENCOUNTER — Encounter: Payer: Self-pay | Admitting: Pulmonary Disease

## 2016-05-16 ENCOUNTER — Ambulatory Visit (INDEPENDENT_AMBULATORY_CARE_PROVIDER_SITE_OTHER): Payer: BLUE CROSS/BLUE SHIELD | Admitting: *Deleted

## 2016-05-16 DIAGNOSIS — R0600 Dyspnea, unspecified: Secondary | ICD-10-CM

## 2016-05-16 LAB — PULMONARY FUNCTION TEST
DL/VA % pred: 89 %
DL/VA: 4.61 ml/min/mmHg/L
DLCO unc % pred: 84 %
DLCO unc: 23.78 ml/min/mmHg
FEF 25-75 Post: 3.12 L/sec
FEF 25-75 Pre: 2.73 L/sec
FEF2575-%Change-Post: 14 %
FEF2575-%Pred-Post: 131 %
FEF2575-%Pred-Pre: 115 %
FEV1-%Change-Post: 2 %
FEV1-%Pred-Post: 101 %
FEV1-%Pred-Pre: 98 %
FEV1-Post: 2.78 L
FEV1-Pre: 2.72 L
FEV1FVC-%Change-Post: 1 %
FEV1FVC-%Pred-Pre: 105 %
FEV6-%Change-Post: 0 %
FEV6-%Pred-Post: 98 %
FEV6-%Pred-Pre: 97 %
FEV6-Post: 3.38 L
FEV6-Pre: 3.35 L
FEV6FVC-%Pred-Post: 104 %
FEV6FVC-%Pred-Pre: 104 %
FVC-%Change-Post: 0 %
FVC-%Pred-Post: 94 %
FVC-%Pred-Pre: 93 %
FVC-Post: 3.38 L
FVC-Pre: 3.35 L
Post FEV1/FVC ratio: 82 %
Post FEV6/FVC ratio: 100 %
Pre FEV1/FVC ratio: 81 %
Pre FEV6/FVC Ratio: 100 %

## 2016-05-16 NOTE — Progress Notes (Signed)
PFT performed today with nitrogen washout. 

## 2016-05-17 ENCOUNTER — Encounter: Payer: Self-pay | Admitting: Pulmonary Disease

## 2016-05-17 ENCOUNTER — Ambulatory Visit (INDEPENDENT_AMBULATORY_CARE_PROVIDER_SITE_OTHER): Payer: BLUE CROSS/BLUE SHIELD | Admitting: Pulmonary Disease

## 2016-05-17 VITALS — BP 148/82 | HR 91 | Ht 68.0 in | Wt 193.6 lb

## 2016-05-17 DIAGNOSIS — J4520 Mild intermittent asthma, uncomplicated: Secondary | ICD-10-CM

## 2016-05-17 NOTE — Patient Instructions (Signed)
Continue Breo and albuterol as needed  You made try on and off Breo inhaler as we discussed to determine its benefit  Follow up in 3 months

## 2016-05-19 NOTE — Progress Notes (Signed)
PULMONARY OFFICE FOLLOW UP NOTE  Requesting MD/Service: Einar Pheasant, MD Date of initial consultation: 04/09/16 Reason for consultation: Dyspnea  PT PROFILE: 67 F never smoker referred for evaluation of episodic mild dyspnea X 8-9 months. Suspected asthma. Initial Plan: trial of ICS/LABA.   DATA: CT chest 11/29/13: minimal bi-apical scarring CXR 12/16/15: NACPD PFTs 05/16/16: normal spirometry, normal volumes, normal DLCO  SUBJ:  DOE is somewhat better on Breo inhaler. Using albuterol MDI less frequently. No new complaints. Denies CP, fever, purulent sputum, hemoptysis, LE edema and calf tenderness.  Vitals:   05/17/16 1102  BP: (!) 148/82  Pulse: 91  SpO2: 95%  Weight: 193 lb 9.6 oz (87.8 kg)  Height: 5\' 8"  (1.727 m)     EXAM:  Gen: WDWN, NAD HEENT: WNL Neck: Supple without JVD Lungs: breath sounds full without wheezes Cardiovascular: RRR, no murmurs noted Abdomen: Moderately obese, soft, nontender, normal BS Ext: without clubbing, cyanosis, edema Neuro: grossly intact  DATA:  PFTs as above    IMPRESSION:     ICD-9-CM ICD-10-CM   1. Mild intermittent asthma without complication [id number]     PLAN:  1) Cont Breo inhaler - one inhalation daily 2) Continue albuterol inhaler - 1 or 2 puffs as needed up to 4 times per day 3) ROV 3 months   Merton Border, MD PCCM service Mobile [phone] Pager [phone] 05/19/2016

## 2016-05-21 ENCOUNTER — Ambulatory Visit: Payer: BLUE CROSS/BLUE SHIELD | Admitting: Physical Therapy

## 2016-05-21 ENCOUNTER — Encounter: Payer: Self-pay | Admitting: Physical Therapy

## 2016-05-21 DIAGNOSIS — R42 Dizziness and giddiness: Secondary | ICD-10-CM

## 2016-05-21 NOTE — Therapy (Signed)
[place] MAIN [facility] SERVICES [address] [place], [place], [address] Phone: [phone]   Fax:  [phone]  Physical Therapy Treatment  Patient Details  Name: Lynn Mann MRN: [id number] Date of Birth: 1951/06/22 Referring Provider: Dr. Carmin Richmond  Encounter Date: 05/21/2016      PT End of Session - 05/21/16 1041    Visit Number 3   Number of Visits 9   Date for PT Re-Evaluation 07/02/16   PT Start Time 1033   PT Stop Time 1121   PT Time Calculation (min) 48 min   Equipment Utilized During Treatment Gait belt   Activity Tolerance Patient tolerated treatment well   Behavior During Therapy [facility] for tasks assessed/performed      Past Medical History:  Diagnosis Date  . Arthritis   . Chicken pox   . Cholecystitis 11/2011   Did not require sgy - Dr. Staci Acosta - [facility]  (cholelithiasis)  . H/O Clostridium difficile infection   . IBS (irritable bowel syndrome)   . MRSA exposure 2005   Spider bite  . MVP (mitral valve prolapse)    Stable - Dr. Ubaldo Glassing  . Rheumatic fever     Past Surgical History:  Procedure Laterality Date  . CATARACT EXTRACTION  [id number]  . MUSCLE BIOPSY      There were no vitals filed for this visit.      Subjective Assessment - 05/21/16 1038    Subjective Patient reports she went for lung fnctioning tests last week and states she was told she had asthma. Patient states she has slight dizziness this am but states she has not had any further issues iwth "big diziness at all". Patient states she has not been able to do her home exercise program consistently this past week.    Pertinent History Patient reports that she began to have dizziness issues in May 2017. Patient states a week before this episode she had bronchitis and fluid in her ears and she was put on steroids. Pt states she had finisher her steroids on Thursday. Pt states she got "seasick" after she drove with her husband to [facility] on Friday. Pt  states she felt her "stomach drop" like on an amusement park ride and then "colors were swirling in front of my face". Pt states her husband said she passed out and fell. Patient does not remember falling or passing out. Patient states she felt there was a "glass circle" around her vision and that her vision looked like she was looking through an old rippled window. Patient states EMS was called and evaluated her, but she did not go to the hospital. Patient states that she did not go to the hospital, but went to an acute care place for care. Pt states that she was put on a Z pack as she still had fluid in the ears. Pt states the a week and a half later she woke up with dizziness and went to the [facility]. Patient said she had a CT head scan of brain which was normal. Patient was referred to a cardiologist.Patient states she had halter monitor, EKG and echocardiogram testing and patient reports all tests were fine. Dr. Nicki Reaper referred patient to ENT and pulmonologist phyisicians. Patient has been seen by ENT and had VNG testing. Per MR, VNG testing revealed mild left sided deficits. Patient reports she has pulmonary function tests ordered for mid October. Patient reports that over 20 years ago she double vision and  describes possible convergence issues. Pt states that they tried prisms in her glasses but patient states that did not help the problem. Pt states she felt the issue resolved with time on its own. Pt reports that she had labyrinthitis in her 50s. Patient states she did not have PT at that time and states she "bounced back pretty well" from that and it only bothered her for about 1 week.    Patient Stated Goals to decrease dizziness   Currently in Pain? No/denies       Neuromuscular Re-education:  VOR: Patient performed VOR X 1 horiz in standing with conflicting background 3 reps of 1 minute each with vc to decrease head turning speed if patient began to have double vision. Patient  stated that she noticed if she tipped her head down she did not get double vision as opposed to at eye level. Patient reports mild symptoms with VOR X 1 with conflicting background.   Saccades: Patient performed multiple reps saccades exercise in horiz, vertical and diagonal planes demonstrating good technique, speed and accuracy. Patient reports mild dizziness symptoms immediately after stopping saccade exercise.   Diona Foley toss to self: Patient performed static standing while tossing ball to self horiz while tracking ball with head and eyes. Patient required verbal cues at times to have head track ball as well.   Diona Foley toss over shoulder: Patient performed multiple 39' trials of forward and retro ambulation while tossing ball over one shoulder with return catch over opposite shoulder with CGA. Patient reports increase in dizziness/ imbalance with forward ambulation and reports mild nausea with retro ambulation with ball toss activity.  Hallway ball toss: In hallway, worked on ball toss against one wall with alternating quick turns to toss ball against opposite wall while tracking with eyes and head. Patient required 2-3 cues to have head and eyes track the ball. Patient reports mild dizziness symptoms with this activity.         PT Education - 05/21/16 1041    Education provided Yes   Education Details Reprinted HEP for patient; reviewed HEP; added seated ball toss to self vert and horiz with eye tracking to HEP   Person(s) Educated Patient   Methods Explanation;Demonstration;Handout   Comprehension Verbalized understanding;Returned demonstration;Verbal cues required             PT Long Term Goals - 05/07/16 1528      PT LONG TERM GOAL #1   Title Patient will be able to perform home program independently for self-management by 07/02/16.   Time 8   Period Weeks   Status New     PT LONG TERM GOAL #2   Title Patient will have demonstrate decreased falls risk as indicated by Activities  Specific Balance Confidence Scale score of 80% or greater by 07/02/16.   Baseline scored 74% on 05/07/16;    Time 8   Period Weeks   Status New     PT LONG TERM GOAL #3   Title Patient will report 50% or greater improvement in her symptoms of dizziness and imbalance with provoking motions or positions by 07/02/16.   Time 8   Period Weeks   Status New     PT LONG TERM GOAL #4   Title Patient will report 3/10 dizziness or less with ambulating up/down stairs and inclines in order to be able to move about the community safely by 07/02/2016.   Time 8   Period Weeks   Status New  Plan - 05/21/16 1041    Clinical Impression Statement Patient reports reproduction of her dizziness, imbalance and nausea symptoms while performing such activities this date as VOR X 1 with conflicting background, retro and forward ambulation with ball toss over shoulder with head and eye tracking target and hallway ball toss with head and eye tracking target. Patient demonstrates fair balance with activities but was noted to have multiple episodes of veering with ambulation with ball toss activity and marked decrease in cadence and step length with retro ambluation activities. Patient would benefit from continued PT services to address goals as set on plan of care, to decrease patient's falls risk and to decrease her subjective symtpoms of dizziness and imbalance.    Rehab Potential Good   Clinical Impairments Affecting Rehab Potential Positive Indicators: motivated, family support, symptoms have been improving since onset   Negative Indicators: history of motion sensitivity   PT Frequency 1x / week   PT Duration 8 weeks   PT Treatment/Interventions Canalith Repostioning;Neuromuscular re-education;Balance training;Patient/family education;Vestibular;Therapeutic activities;Therapeutic exercise   PT Next Visit Plan Progress VOR x 1 as able including vertical, continue semitandem progression and initiate  body turns, ball toss over shoulder at varying positions   PT Home Exercise Plan VOR X 1 in standing with feet together horizontal 50% and vertical 50% of the time, semitandem compliant surface horizontal head turns, seated horizontal/vertical/diagonal saccades; seated ball toss to self horiz and vert with head and eye following/tracking ball;    Consulted and Agree with Plan of Care Patient      Patient will benefit from skilled therapeutic intervention in order to improve the following deficits and impairments:  Decreased balance, Dizziness  Visit Diagnosis: Dizziness and giddiness     Problem List Patient Active Problem List   Diagnosis Date Noted  . Dizziness 04/01/2016  . SOB (shortness of breath) 04/01/2016  . Bilateral shoulder pain 02/24/2016  . Fatigue 01/28/2016  . Neck fullness 01/28/2016  . Routine general medical examination at a health care facility 07/25/2015  . Health care maintenance 10/09/2014  . Foot pain 06/19/2014  . Diarrhea 04/08/2014  . Hyperglycemia 02/07/2014  . Neck pain 11/26/2013  . Hypercholesterolemia 11/26/2013  . History of colonic polyps 05/11/2013  . Hyperbilirubinemia 01/18/2013  . GERD (gastroesophageal reflux disease) 12/03/2012  . Pneumonia 11/27/2012  . Cholelithiasis 11/07/2012  . IBS (irritable bowel syndrome) 11/07/2012  . History of rheumatic fever 08/28/2012  . MVP (mitral valve prolapse) 08/28/2012    Armentha Branagan 05/21/2016, 3:29 PM  [place] MAIN [facility] SERVICES [address] [place], [place], [address] Phone: [phone]   Fax:  [phone]  Name: Lynn Mann MRN: [id number] Date of Birth: [birth year]

## 2016-05-22 ENCOUNTER — Encounter: Payer: BLUE CROSS/BLUE SHIELD | Admitting: Physical Therapy

## 2016-05-28 ENCOUNTER — Ambulatory Visit: Payer: BLUE CROSS/BLUE SHIELD | Admitting: Physical Therapy

## 2016-05-28 ENCOUNTER — Encounter: Payer: Self-pay | Admitting: Physical Therapy

## 2016-05-28 DIAGNOSIS — R42 Dizziness and giddiness: Secondary | ICD-10-CM

## 2016-05-28 NOTE — Therapy (Addendum)
[place] MAIN [facility] SERVICES [address] [place], [place], [address] Phone: [phone]   Fax:  [phone]  Physical Therapy Treatment  Patient Details  Name: Lynn Mann MRN: [id number] Date of Birth: 06-Jan-1951 Referring Provider: Dr. Carmin Richmond  Encounter Date: 05/28/2016      PT End of Session - 05/28/16 1042    Visit Number 4   Number of Visits 9   Date for PT Re-Evaluation 07/02/16   PT Start Time 1032   PT Stop Time 1130   PT Time Calculation (min) 58 min   Equipment Utilized During Treatment Gait belt   Activity Tolerance Patient tolerated treatment well   Behavior During Therapy [facility] for tasks assessed/performed      Past Medical History:  Diagnosis Date  . Arthritis   . Chicken pox   . Cholecystitis 11/2011   Did not require sgy - Dr. Staci Acosta - [facility]  (cholelithiasis)  . H/O Clostridium difficile infection   . IBS (irritable bowel syndrome)   . MRSA exposure 2005   Spider bite  . MVP (mitral valve prolapse)    Stable - Dr. Ubaldo Glassing  . Rheumatic fever     Past Surgical History:  Procedure Laterality Date  . CATARACT EXTRACTION  [id number]  . MUSCLE BIOPSY      There were no vitals filed for this visit.      Subjective Assessment - 05/28/16 1035    Subjective Patient states at the beginning of the week states she felt more comfortable and was driving. Patient states that because she was able to do more at home and her symptoms were improving she did not do her exercises/HEP as much. Patient states she worked in the yard over the weekend.    Pertinent History Patient reports that she began to have dizziness issues in May 2017. Patient states a week before this episode she had bronchitis and fluid in her ears and she was put on steroids. Pt states she had finisher her steroids on Thursday. Pt states she got "seasick" after she drove with her husband to [facility] on Friday. Pt states she felt her "stomach drop"  like on an amusement park ride and then "colors were swirling in front of my face". Pt states her husband said she passed out and fell. Patient does not remember falling or passing out. Patient states she felt there was a "glass circle" around her vision and that her vision looked like she was looking through an old rippled window. Patient states EMS was called and evaluated her, but she did not go to the hospital. Patient states that she did not go to the hospital, but went to an acute care place for care. Pt states that she was put on a Z pack as she still had fluid in the ears. Pt states the a week and a half later she woke up with dizziness and went to the [facility]. Patient said she had a CT head scan of brain which was normal. Patient was referred to a cardiologist.Patient states she had halter monitor, EKG and echocardiogram testing and patient reports all tests were fine. Dr. Nicki Reaper referred patient to ENT and pulmonologist phyisicians. Patient has been seen by ENT and had VNG testing. Per MR, VNG testing revealed mild left sided deficits. Patient reports she has pulmonary function tests ordered for mid October. Patient reports that over 20 years ago she double vision and describes possible convergence issues. Pt states  that they tried prisms in her glasses but patient states that did not help the problem. Pt states she felt the issue resolved with time on its own. Pt reports that she had labyrinthitis in her 5s. Patient states she did not have PT at that time and states she "bounced back pretty well" from that and it only bothered her for about 1 week.    Currently in Pain? Other (Comment)  none stated      Neuromuscular Re-education: VOR x 1 exercise: In standing on firm surface, pt performed VOR X 1 horiz 1 rep of 1 minute and 1 rep of 1 minute of VOR X 1 vert with conflicting background. Patient reports that she is not experiencing blurring of the target now with VOR X 1 exercise  but does report mild blurring of the background and mild nausea with voertical VOR exercise.   Saccades: Patient performed in standing vert and horiz saccades 1 minute reps each on conflicting background. Patient accurate and demonstrates good speed with saccades and patient reporting now that it makes her mildly dizzy and queasy which is decreased as compared to initial attempts at saccades exercise.  Body Wall Rolls: Patient performed 3 reps of supported, body wall rolls with eyes open. Patient reports dizziness with this activity.   Airex pad: On firm surface and then on Airex pad, patient performed feet together and semi-tandem progressions with alternating lead leg with and without body turns and horiz and vert head turns. Patient reports mild increase in dizziness with horiz head turns.   Diona Foley toss over shoulder: Patient performed multiple 86' trials of forward and retro ambulation while tossing ball over one shoulder with return catch over opposite shoulder varying the ball position to head, shoulder and waist level to promote head turning and tilting. Patient reports dizziness and mild nausea with this activity. Patient required 2 short, standing rest breaks less than one minute each.        PT Education - 05/28/16 1041    Education provided Yes   Education Details Reprinted HEP and added body wall rolls   Person(s) Educated Patient   Methods Explanation;Demonstration;Handout   Comprehension Verbalized understanding;Returned demonstration             PT Long Term Goals - 05/07/16 1528      PT LONG TERM GOAL #1   Title Patient will be able to perform home program independently for self-management by 07/02/16.   Time 8   Period Weeks   Status New     PT LONG TERM GOAL #2   Title Patient will have demonstrate decreased falls risk as indicated by Activities Specific Balance Confidence Scale score of 80% or greater by 07/02/16.   Baseline scored 74% on 05/07/16;    Time 8    Period Weeks   Status New     PT LONG TERM GOAL #3   Title Patient will report 50% or greater improvement in her symptoms of dizziness and imbalance with provoking motions or positions by 07/02/16.   Time 8   Period Weeks   Status New     PT LONG TERM GOAL #4   Title Patient will report 3/10 dizziness or less with ambulating up/down stairs and inclines in order to be able to move about the community safely by 07/02/2016.   Time 8   Period Weeks   Status New               Plan - 05/28/16 1042  Clinical Impression Statement Patient reporting improvement in her dizziness symptoms and states she was able to be more active and resume some of her prior activities this past week. Patient required encouragement to continue to perform HEP. Patient reporting that target is not blurring now when performing VOR X 1 exercise. Patient appears to be demonstrating signs of improvement with gaze stabilization exercises. Patient challenged by retro ambulation with ball toss over shoulder and activities on Airex pad this date. Patient would benefit from continued PT services to continue to address deficits in order to decrease patient's symptoms and address goals as set on plan of care.    Rehab Potential Good   Clinical Impairments Affecting Rehab Potential Positive Indicators: motivated, family support, symptoms have been improving since onset   Negative Indicators: history of motion sensitivity   PT Frequency 1x / week   PT Duration 8 weeks   PT Treatment/Interventions Canalith Repostioning;Neuromuscular re-education;Balance training;Patient/family education;Vestibular;Therapeutic activities;Therapeutic exercise   PT Next Visit Plan consider hallyway ball toss, ambulation in busy environment with head turns, amb while scanning for targets   PT Home Exercise Plan VOR X 1 in standing with feet together horizontal 50% and vertical 50% of the time, semitandem compliant surface horizontal head turns,  seated horizontal/vertical/diagonal saccades; seated ball toss to self horiz and vert with head and eye following/tracking ball; body wall rolls   Consulted and Agree with Plan of Care Patient      Patient will benefit from skilled therapeutic intervention in order to improve the following deficits and impairments:  Decreased balance, Dizziness  Visit Diagnosis: Dizziness and giddiness     Problem List Patient Active Problem List   Diagnosis Date Noted  . Dizziness 04/01/2016  . SOB (shortness of breath) 04/01/2016  . Bilateral shoulder pain 02/24/2016  . Fatigue 01/28/2016  . Neck fullness 01/28/2016  . Routine general medical examination at a health care facility 07/25/2015  . Health care maintenance 10/09/2014  . Foot pain 06/19/2014  . Diarrhea 04/08/2014  . Hyperglycemia 02/07/2014  . Neck pain 11/26/2013  . Hypercholesterolemia 11/26/2013  . History of colonic polyps 05/11/2013  . Hyperbilirubinemia 01/18/2013  . GERD (gastroesophageal reflux disease) 12/03/2012  . Pneumonia 11/27/2012  . Cholelithiasis 11/07/2012  . IBS (irritable bowel syndrome) 11/07/2012  . History of rheumatic fever 08/28/2012  . MVP (mitral valve prolapse) 08/28/2012   Lady Deutscher PT, DPT Layken Beg 05/28/2016, 1:51 PM  [place] MAIN [facility] SERVICES [address] [place], [place], [address] Phone: [phone]   Fax:  [phone]  Name: Lynn Mann MRN: [id number] Date of Birth: 06/08/1951

## 2016-05-29 ENCOUNTER — Encounter: Payer: BLUE CROSS/BLUE SHIELD | Admitting: Physical Therapy

## 2016-06-04 ENCOUNTER — Ambulatory Visit: Payer: BLUE CROSS/BLUE SHIELD | Admitting: Physical Therapy

## 2016-06-14 ENCOUNTER — Ambulatory Visit: Payer: BLUE CROSS/BLUE SHIELD | Attending: Otolaryngology

## 2016-06-14 ENCOUNTER — Encounter: Payer: Self-pay | Admitting: Physical Therapy

## 2016-06-14 ENCOUNTER — Telehealth: Payer: Self-pay | Admitting: *Deleted

## 2016-06-14 VITALS — BP 130/68 | HR 79

## 2016-06-14 DIAGNOSIS — R42 Dizziness and giddiness: Secondary | ICD-10-CM | POA: Insufficient documentation

## 2016-06-14 NOTE — Therapy (Signed)
[place] MAIN [facility] SERVICES [address] [place], [place], [address] Phone: [phone]   Fax:  [phone]  Physical Therapy Treatment  Patient Details  Name: Lynn Mann MRN: [id number] Date of Birth: Jan 21, 1951 Referring Provider: Dr. Carmin Richmond  Encounter Date: 06/14/2016      PT End of Session - 06/14/16 1138    Visit Number 5   Number of Visits 9   Date for PT Re-Evaluation 07/02/16   PT Start Time 1135   PT Stop Time 1200   PT Time Calculation (min) 25 min   Equipment Utilized During Treatment Gait belt   Activity Tolerance Patient tolerated treatment well   Behavior During Therapy [facility] for tasks assessed/performed      Past Medical History:  Diagnosis Date  . Arthritis   . Chicken pox   . Cholecystitis 11/2011   Did not require sgy - Dr. Staci Acosta - [facility]  (cholelithiasis)  . H/O Clostridium difficile infection   . IBS (irritable bowel syndrome)   . MRSA exposure 2005   Spider bite  . MVP (mitral valve prolapse)    Stable - Dr. Ubaldo Glassing  . Rheumatic fever     Past Surgical History:  Procedure Laterality Date  . CATARACT EXTRACTION  [id number]  . MUSCLE BIOPSY      Vitals:   06/14/16 1139  BP: 130/68  Pulse: 79        Subjective Assessment - 06/14/16 1137    Subjective Pt states that she is doing alright today. She reports some persistent dizziness but states that some of it is due to poor sleep since her husband has been out of town. States that she has only slept approximately 15 hours since Sunday. No specific questions or concerns.    Pertinent History Patient reports that she began to have dizziness issues in May 2017. Patient states a week before this episode she had bronchitis and fluid in her ears and she was put on steroids. Pt states she had finisher her steroids on Thursday. Pt states she got "seasick" after she drove with her husband to [facility] on Friday. Pt states she felt her "stomach drop" like  on an amusement park ride and then "colors were swirling in front of my face". Pt states her husband said she passed out and fell. Patient does not remember falling or passing out. Patient states she felt there was a "glass circle" around her vision and that her vision looked like she was looking through an old rippled window. Patient states EMS was called and evaluated her, but she did not go to the hospital. Patient states that she did not go to the hospital, but went to an acute care place for care. Pt states that she was put on a Z pack as she still had fluid in the ears. Pt states the a week and a half later she woke up with dizziness and went to the [facility]. Patient said she had a CT head scan of brain which was normal. Patient was referred to a cardiologist.Patient states she had halter monitor, EKG and echocardiogram testing and patient reports all tests were fine. Dr. Nicki Reaper referred patient to ENT and pulmonologist phyisicians. Patient has been seen by ENT and had VNG testing. Per MR, VNG testing revealed mild left sided deficits. Patient reports she has pulmonary function tests ordered for mid October. Patient reports that over 20 years ago she double vision and describes possible convergence issues.  Pt states that they tried prisms in her glasses but patient states that did not help the problem. Pt states she felt the issue resolved with time on its own. Pt reports that she had labyrinthitis in her 45s. Patient states she did not have PT at that time and states she "bounced back pretty well" from that and it only bothered her for about 1 week.    Currently in Pain? No/denies        Neuromuscular Re-education:  VOR x 1 exercise: In standing on Airex pad, pt performed VOR X 1 horiz with conflicting background 2 reps of 1 minute (no dizziness) and 2 rep of 1 minute of VOR X 1 vert with conflicting background (a little dizziness with nausea "across my chest."), heavy verbal and  tactile cues for patient to perform correctly.  Body Wall Rolls Patient performed multiple reps of supported, body wall rolls with eyes open. Patient reports unsteadiness with this activity but dizziness;  Hallway Ambulation Forward ambulation in hallway with vertical ball tosses to self 75' x 2 (4/10); Forward ambulation in allway with lateral ball tosses to therapist 29' x 2 (5/10);  Diona Foley toss over shoulder: Patient performed multiple 49' trials of forward and retro ambulation while tossing ball over one shoulder with return catch over opposite shoulder varying the ball position to head, shoulder and waist level to promote head turning and tilting. Patient reports 5/10 dizziness with this activity.                         PT Education - 06/14/16 1138    Education provided Yes   Education Details reinforced HEP   Person(s) Educated Patient   Comprehension Verbalized understanding             PT Long Term Goals - 05/07/16 1528      PT LONG TERM GOAL #1   Title Patient will be able to perform home program independently for self-management by 07/02/16.   Time 8   Period Weeks   Status New     PT LONG TERM GOAL #2   Title Patient will have demonstrate decreased falls risk as indicated by Activities Specific Balance Confidence Scale score of 80% or greater by 07/02/16.   Baseline scored 74% on 05/07/16;    Time 8   Period Weeks   Status New     PT LONG TERM GOAL #3   Title Patient will report 50% or greater improvement in her symptoms of dizziness and imbalance with provoking motions or positions by 07/02/16.   Time 8   Period Weeks   Status New     PT LONG TERM GOAL #4   Title Patient will report 3/10 dizziness or less with ambulating up/down stairs and inclines in order to be able to move about the community safely by 07/02/2016.   Time 8   Period Weeks   Status New               Plan - 06/14/16 1139    Clinical Impression Statement Pt  arrived late for her appointment so abbreviated session performed. She reports dizziness with head turns during ambulation. Pt struggles today to perform VOR x 1 correctly requiring verbal and tactile feedback to assist. No HEP progression provided today but encouraged to continue with current program. Follow-up as scheduled.    Rehab Potential Good   Clinical Impairments Affecting Rehab Potential Positive Indicators: motivated, family support, symptoms have been improving  since onset   Negative Indicators: history of motion sensitivity   PT Frequency 1x / week   PT Duration 8 weeks   PT Treatment/Interventions Canalith Repostioning;Neuromuscular re-education;Balance training;Patient/family education;Vestibular;Therapeutic activities;Therapeutic exercise   PT Next Visit Plan Hallway ball toss, ambulation in busy environment with head turns, amb while scanning for targets   PT Home Exercise Plan VOR X 1 in standing with feet together horizontal 50% and vertical 50% of the time, semitandem compliant surface horizontal head turns, seated horizontal/vertical/diagonal saccades; seated ball toss to self horiz and vert with head and eye following/tracking ball; body wall rolls   Consulted and Agree with Plan of Care Patient      Patient will benefit from skilled therapeutic intervention in order to improve the following deficits and impairments:  Decreased balance, Dizziness  Visit Diagnosis: Dizziness and giddiness     Problem List Patient Active Problem List   Diagnosis Date Noted  . Dizziness 04/01/2016  . SOB (shortness of breath) 04/01/2016  . Bilateral shoulder pain 02/24/2016  . Fatigue 01/28/2016  . Neck fullness 01/28/2016  . Routine general medical examination at a health care facility 07/25/2015  . Health care maintenance 10/09/2014  . Foot pain 06/19/2014  . Diarrhea 04/08/2014  . Hyperglycemia 02/07/2014  . Neck pain 11/26/2013  . Hypercholesterolemia 11/26/2013  . History of  colonic polyps 05/11/2013  . Hyperbilirubinemia 01/18/2013  . GERD (gastroesophageal reflux disease) 12/03/2012  . Pneumonia 11/27/2012  . Cholelithiasis 11/07/2012  . IBS (irritable bowel syndrome) 11/07/2012  . History of rheumatic fever 08/28/2012  . MVP (mitral valve prolapse) 08/28/2012   Phillips Grout PT, DPT  Huprich,Jason 06/14/2016, 12:20 PM  [place] MAIN [facility] SERVICES [address] [place], [place], [address] Phone: [phone]   Fax:  [phone]  Name: Lynn Mann MRN: [id number] Date of Birth: October 08, 1950

## 2016-06-14 NOTE — Telephone Encounter (Signed)
Pt was transferred to patient accounting for questions about her bill

## 2016-06-17 ENCOUNTER — Ambulatory Visit: Payer: BLUE CROSS/BLUE SHIELD | Admitting: Physical Therapy

## 2016-06-18 ENCOUNTER — Ambulatory Visit: Payer: BLUE CROSS/BLUE SHIELD | Admitting: Physical Therapy

## 2016-06-18 ENCOUNTER — Encounter: Payer: Self-pay | Admitting: Physical Therapy

## 2016-06-18 DIAGNOSIS — R42 Dizziness and giddiness: Secondary | ICD-10-CM

## 2016-06-18 NOTE — Therapy (Signed)
[place] MAIN [facility] SERVICES [address] [place], [place], [address] Phone: [phone]   Fax:  [phone]  Physical Therapy Treatment  Patient Details  Name: Lynn Mann MRN: [id number] Date of Birth: Dec 19, 1950 Referring Provider: Dr. Carmin Richmond  Encounter Date: 06/18/2016      PT End of Session - 06/18/16 0914    Visit Number 6   Number of Visits 9   Date for PT Re-Evaluation 07/02/16   PT Start Time 0909   PT Stop Time 0954   PT Time Calculation (min) 45 min   Equipment Utilized During Treatment Gait belt   Activity Tolerance Patient tolerated treatment well   Behavior During Therapy [facility] for tasks assessed/performed      Past Medical History:  Diagnosis Date  . Arthritis   . Chicken pox   . Cholecystitis 11/2011   Did not require sgy - Dr. Staci Acosta - [facility]  (cholelithiasis)  . H/O Clostridium difficile infection   . IBS (irritable bowel syndrome)   . MRSA exposure 2005   Spider bite  . MVP (mitral valve prolapse)    Stable - Dr. Ubaldo Glassing  . Rheumatic fever     Past Surgical History:  Procedure Laterality Date  . CATARACT EXTRACTION  [id number]  . MUSCLE BIOPSY      There were no vitals filed for this visit.      Subjective Assessment - 06/18/16 0912    Subjective Patient reports she has felt a little unsteady the last few days and has been touching the walls for support at times. Patient reports she has also been having a little dizziness.   Pertinent History Patient reports that she began to have dizziness issues in May 2017. Patient states a week before this episode she had bronchitis and fluid in her ears and she was put on steroids. Pt states she had finisher her steroids on Thursday. Pt states she got "seasick" after she drove with her husband to [facility] on Friday. Pt states she felt her "stomach drop" like on an amusement park ride and then "colors were swirling in front of my face". Pt states her husband  said she passed out and fell. Patient does not remember falling or passing out. Patient states she felt there was a "glass circle" around her vision and that her vision looked like she was looking through an old rippled window. Patient states EMS was called and evaluated her, but she did not go to the hospital. Patient states that she did not go to the hospital, but went to an acute care place for care. Pt states that she was put on a Z pack as she still had fluid in the ears. Pt states the a week and a half later she woke up with dizziness and went to the [facility]. Patient said she had a CT head scan of brain which was normal. Patient was referred to a cardiologist.Patient states she had halter monitor, EKG and echocardiogram testing and patient reports all tests were fine. Dr. Nicki Reaper referred patient to ENT and pulmonologist phyisicians. Patient has been seen by ENT and had VNG testing. Per MR, VNG testing revealed mild left sided deficits. Patient reports she has pulmonary function tests ordered for mid October. Patient reports that over 20 years ago she double vision and describes possible convergence issues. Pt states that they tried prisms in her glasses but patient states that did not help the problem. Pt states she felt  the issue resolved with time on its own. Pt reports that she had labyrinthitis in her 23s. Patient states she did not have PT at that time and states she "bounced back pretty well" from that and it only bothered her for about 1 week.    Currently in Pain? --  body muscle aches      Neuromuscular Re-education:  Performed multiple trials of forward ambulation 35' while doing horiz VOR X 1 viewing with conflicting background. In standing on firm surface, pt performed VOR X 1 horiz 2 reps of 1 minute each with conflicting background.  Patient continues to report mild dizziness with VOR with conflicting background.   Hallway ball toss: In hallway, worked on ball toss  against one wall with alternating quick turns to toss ball against opposite wall while tracking with eyes and head. Patient reports mild dizziness and nausea with this activity.   On [employer] balance beam: Pt performed static sideways stance with and without horiz and vert head turns and then sidestepping L/R without head turns and then with horiz head turns multiple reps each type times 5' each.    2" X 4" board:   On 2" X 4" board worked on static sideways stance with and without head turns and then worked on Chart[employer] certified accountant L/R with and without horiz multiple reps of each type times 8'.  Walking while scanning for visual targets: Performed ambulationin busy visual environment (medical mall) while scanning for visual targets in hallway as called out by therapist and then as selected by patient including horiz, vert and diagonal head turns. Patient reports that diagonal head turns are a little more challenging.  Stairs: Patient ambulated down one flight of stairs holding bilateral rails. Worked on multiple reps of ascending/descending 6' steps with one rail while doing horiz head turns. Patient steady with this activity with no loss of balance with SBA. Patient reports mild dizziness and nausea with this activity. Patient expresses that she has difficulty with stairs due to depth perception and dizziness. Will plan on practicing stairs again next visit.       PT Education - 06/18/16 1003    Education provided Yes   Education Details Progressed VOR with conflicting background    Person(s) Educated Patient   Methods Explanation;Demonstration   Comprehension Verbalized understanding;Returned demonstration             PT Long Term Goals - 06/18/16 1004      PT LONG TERM GOAL #1   Title Patient will be able to perform home program independently for self-management by 07/02/16.   Time 8   Period Weeks   Status Achieved     PT LONG TERM GOAL #2   Title Patient will have demonstrate  decreased falls risk as indicated by Activities Specific Balance Confidence Scale score of 80% or greater by 07/02/16.   Baseline scored 74% on 05/07/16;    Time 8   Period Weeks   Status On-going     PT LONG TERM GOAL #3   Title Patient will report 50% or greater improvement in her symptoms of dizziness and imbalance with provoking motions or positions by 07/02/16.   Time 8   Period Weeks   Status Partially Met     PT LONG TERM GOAL #4   Title Patient will report 3/10 dizziness or less with ambulating up/down stairs and inclines in order to be able to move about the community safely by 07/02/2016.   Time 8   Period Weeks  Status On-going               Plan - 06/18/16 0914    Clinical Impression Statement Patient reports that she had mild symptoms this past week. Patient did well ambulating in busy visual environment while performing horiz, vert and diagonal head turns with no loss of balance or veering noted with patient reporting mild dizziness and nausea symptoms. Will plan on progressing stair activties and trying inclines next session as well as repeating functional outcome measures.     Rehab Potential Good   Clinical Impairments Affecting Rehab Potential Positive Indicators: motivated, family support, symptoms have been improving since onset   Negative Indicators: history of motion sensitivity   PT Frequency 1x / week   PT Duration 8 weeks   PT Treatment/Interventions Canalith Repostioning;Neuromuscular re-education;Balance training;Patient/family education;Vestibular;Therapeutic activities;Therapeutic exercise   PT Next Visit Plan ascending/descending stairs progression, try inclines and plan on repeating functional outcome measures and updating goals next visit.    PT Home Exercise Plan VOR X 1 in standing with feet together horizontal 50% and vertical 50% of the time, semitandem compliant surface horizontal head turns, seated horizontal/vertical/diagonal saccades; seated  ball toss to self horiz and vert with head and eye following/tracking ball; body wall rolls   Consulted and Agree with Plan of Care Patient      Patient will benefit from skilled therapeutic intervention in order to improve the following deficits and impairments:  Decreased balance, Dizziness  Visit Diagnosis: Dizziness and giddiness     Problem List Patient Active Problem List   Diagnosis Date Noted  . Dizziness 04/01/2016  . SOB (shortness of breath) 04/01/2016  . Bilateral shoulder pain 02/24/2016  . Fatigue 01/28/2016  . Neck fullness 01/28/2016  . Routine general medical examination at a health care facility 07/25/2015  . Health care maintenance 10/09/2014  . Foot pain 06/19/2014  . Diarrhea 04/08/2014  . Hyperglycemia 02/07/2014  . Neck pain 11/26/2013  . Hypercholesterolemia 11/26/2013  . History of colonic polyps 05/11/2013  . Hyperbilirubinemia 01/18/2013  . GERD (gastroesophageal reflux disease) 12/03/2012  . Pneumonia 11/27/2012  . Cholelithiasis 11/07/2012  . IBS (irritable bowel syndrome) 11/07/2012  . History of rheumatic fever 08/28/2012  . MVP (mitral valve prolapse) 08/28/2012   Lady Deutscher PT, DPT Lady Deutscher 06/18/2016, 10:16 AM  [place] MAIN [facility] SERVICES [address] [place], [place], [address] Phone: [phone]   Fax:  [phone]  Name: Lynn Mann MRN: [id number] Date of Birth: 1951-06-03

## 2016-06-25 ENCOUNTER — Encounter: Payer: BLUE CROSS/BLUE SHIELD | Admitting: Physical Therapy

## 2016-06-26 ENCOUNTER — Ambulatory Visit: Payer: BLUE CROSS/BLUE SHIELD

## 2016-06-26 VITALS — BP 128/69 | HR 75

## 2016-06-26 DIAGNOSIS — R42 Dizziness and giddiness: Secondary | ICD-10-CM

## 2016-06-26 NOTE — Therapy (Signed)
[place] MAIN [facility] SERVICES [address] [place], [place], [address] Phone: [phone]   Fax:  [phone]  Physical Therapy Treatment  Patient Details  Name: Lynn Mann MRN: [id number] Date of Birth: 1950-09-13 Referring Provider: Dr. Carmin Richmond  Encounter Date: 06/26/2016      PT End of Session - 06/27/16 2104    Visit Number 7   Number of Visits 13   Date for PT Re-Evaluation 07/25/16   PT Start Time 1400   PT Stop Time 1445   PT Time Calculation (min) 45 min   Equipment Utilized During Treatment Gait belt   Activity Tolerance Patient tolerated treatment well   Behavior During Therapy [facility] for tasks assessed/performed      Past Medical History:  Diagnosis Date  . Arthritis   . Chicken pox   . Cholecystitis 11/2011   Did not require sgy - Dr. Staci Acosta - [facility]  (cholelithiasis)  . H/O Clostridium difficile infection   . IBS (irritable bowel syndrome)   . MRSA exposure 2005   Spider bite  . MVP (mitral valve prolapse)    Stable - Dr. Ubaldo Glassing  . Rheumatic fever     Past Surgical History:  Procedure Laterality Date  . CATARACT EXTRACTION  [id number]  . MUSCLE BIOPSY      Vitals:   06/26/16 1420  BP: 128/69  Pulse: 75  SpO2: 99%        Subjective Assessment - 06/26/16 1420    Subjective Pt reports she has been having a bad week with increased symptoms. She would like to know if the bone spurs in her neck could be contributing to her symptoms. She has not been performing her HEP due to time constraints.    Pertinent History Patient reports that she began to have dizziness issues in May 2017. Patient states a week before this episode she had bronchitis and fluid in her ears and she was put on steroids. Pt states she had finisher her steroids on Thursday. Pt states she got "seasick" after she drove with her husband to [facility] on Friday. Pt states she felt her "stomach drop" like on an amusement park ride and then  "colors were swirling in front of my face". Pt states her husband said she passed out and fell. Patient does not remember falling or passing out. Patient states she felt there was a "glass circle" around her vision and that her vision looked like she was looking through an old rippled window. Patient states EMS was called and evaluated her, but she did not go to the hospital. Patient states that she did not go to the hospital, but went to an acute care place for care. Pt states that she was put on a Z pack as she still had fluid in the ears. Pt states the a week and a half later she woke up with dizziness and went to the [facility]. Patient said she had a CT head scan of brain which was normal. Patient was referred to a cardiologist.Patient states she had halter monitor, EKG and echocardiogram testing and patient reports all tests were fine. Dr. Nicki Reaper referred patient to ENT and pulmonologist phyisicians. Patient has been seen by ENT and had VNG testing. Per MR, VNG testing revealed mild left sided deficits. Patient reports she has pulmonary function tests ordered for mid October. Patient reports that over 20 years ago she double vision and describes possible convergence issues. Pt states that they  tried prisms in her glasses but patient states that did not help the problem. Pt states she felt the issue resolved with time on its own. Pt reports that she had labyrinthitis in her 25s. Patient states she did not have PT at that time and states she "bounced back pretty well" from that and it only bothered her for about 1 week.    Patient Stated Goals to decrease dizziness   Currently in Pain? No/denies            [facility] PT Assessment - 06/26/16 1515      Observation/Other Assessments   Other Surveys  Other Surveys   Activities of Balance Confidence Scale (ABC Scale)  65%     Standardized Balance Assessment   Standardized Balance Assessment Dynamic Gait Index     Dynamic Gait Index   Level  Surface Normal   Change in Gait Speed Normal   Gait with Horizontal Head Turns Mild Impairment   Gait with Vertical Head Turns Mild Impairment   Gait and Pivot Turn Normal   Step Over Obstacle Normal   Step Around Obstacles Normal   Steps Normal   Total Score 22       Update outcome measures (ABC, DGI 21/24 initially with head turns and steps) and goals, consider DC?  ascending/descending stairs progression, try inclines and plan o    Neuromuscular Re-education:  ABC completed by patient (unbilled); Interpreted and discussed results of ABC with patient; Perform DGI with patient who scored 22/24 (21/24 at initial evaluation), she continues to demonstrate lateral gait deviation with horizontal and vertical head turns  Stairs: Worked on multiple reps of ascending/descending 6' steps with one rail while performing cone taps followed by cone taps with serial naming. Patient steady with this activity with no loss of balance with SBA. Patient reports mild nausea with this activity but no dizziness. Patient expresses that she has difficulty with stairs due to depth perception and dizziness. She is fearful of missing a step when descending.   Hallway ball toss In hallway, worked on ball toss against one wall while tracking with eyes and head. Patient reports mild nausea with this activity.  VOR Performed multiple trials of forward ambulation 35' while doing horiz VOR X 1 viewing with conflicting background.  Spent time with patient discussing her progress with therapy and plan of care moving forward. Pt encouraged to perform HEP consistently to see improvement in her symptoms. Pt asking about her bone spurs and whether this could be contributing to her symptoms. Discussed with patient that this is unlikely a cause for her dizziness given her history and presentation with a known unilateral hypofunction on VNG.                 PT Education - 06/27/16 2104    Education provided  Yes   Education Details Importance of HEP reinforced, goals, plan of care   Person(s) Educated Patient   Methods Explanation   Comprehension Verbalized understanding             PT Long Term Goals - 06/26/16 1429      PT LONG TERM GOAL #1   Title Patient will be able to perform home program independently for self-management by 07/02/16.   Baseline 06/26/16: not performing consistently   Time 8   Period Weeks   Status Achieved     PT LONG TERM GOAL #2   Title Patient will have demonstrate decreased falls risk as indicated by Activities Specific Balance  Confidence Scale score of 80% or greater by 07/02/16.   Baseline scored 74% on 05/07/16; 06/26/16: 65%   Time 8   Period Weeks   Status On-going     PT LONG TERM GOAL #3   Title Patient will report 50% or greater improvement in her symptoms of dizziness and imbalance with provoking motions or positions by 07/02/16.   Baseline 06/26/16: 75% reported improvement   Time 8   Period Weeks   Status Partially Met     PT LONG TERM GOAL #4   Title Patient will report 3/10 dizziness or less with ambulating up/down stairs and inclines in order to be able to move about the community safely by 07/02/2016.   Baseline 06/26/16: will not rate severity despite repeated questioning, states she avoids stairs as she feels like she will miss her step   Time 8   Period Weeks   Status On-going               Plan - 06/27/16 2105    Clinical Impression Statement Pt reports she has seen improvement with her dizziness since starting therapy. She also reports that she feels safer and "more connected to the ground." However her outcome measures do not show measureable improvement with her ABC decreasing from the initial evaluation to 65%. She continues to demonstrate lateral gait deviations with horizontal and vertical head turns. Pt unable to quantify the severity of her symptoms despite repeated questioning during session. She also reports  inconsistent performance of her home exercise program since starting therapy. Pt provided option of discharge however she states that she would like to continue on for another 4 weeks to ensure independence with home program and to see if she can achieved further decrease in her symptoms. Pt will benefit from another 4 weeks of skilled vestibular therapy to attempt to further decrease her symptoms, improve her balance, and increase her function at home.    Rehab Potential Good   Clinical Impairments Affecting Rehab Potential Positive Indicators: motivated, family support, symptoms have been improving since onset   Negative Indicators: history of motion sensitivity   PT Frequency 1x / week   PT Duration 4 weeks   PT Treatment/Interventions Canalith Repostioning;Neuromuscular re-education;Balance training;Patient/family education;Vestibular;Therapeutic activities;Therapeutic exercise   PT Next Visit Plan ascending/descending stairs progression, try inclines, progress VOR and head turning activities.    PT Home Exercise Plan VOR X 1 in standing with feet together horizontal 50% and vertical 50% of the time, semitandem compliant surface horizontal head turns, seated horizontal/vertical/diagonal saccades; seated ball toss to self horiz and vert with head and eye following/tracking ball; body wall rolls   Consulted and Agree with Plan of Care Patient      Patient will benefit from skilled therapeutic intervention in order to improve the following deficits and impairments:  Decreased balance, Dizziness  Visit Diagnosis: Dizziness and giddiness - Plan: PT plan of care cert/re-cert     Problem List Patient Active Problem List   Diagnosis Date Noted  . Dizziness 04/01/2016  . SOB (shortness of breath) 04/01/2016  . Bilateral shoulder pain 02/24/2016  . Fatigue 01/28/2016  . Neck fullness 01/28/2016  . Routine general medical examination at a health care facility 07/25/2015  . Health care  maintenance 10/09/2014  . Foot pain 06/19/2014  . Diarrhea 04/08/2014  . Hyperglycemia 02/07/2014  . Neck pain 11/26/2013  . Hypercholesterolemia 11/26/2013  . History of colonic polyps 05/11/2013  . Hyperbilirubinemia 01/18/2013  . GERD (gastroesophageal reflux disease) 12/03/2012  .  Pneumonia 11/27/2012  . Cholelithiasis 11/07/2012  . IBS (irritable bowel syndrome) 11/07/2012  . History of rheumatic fever 08/28/2012  . MVP (mitral valve prolapse) 08/28/2012   Phillips Grout PT, DPT   Huprich,Jason 06/27/2016, 9:24 PM  [place] MAIN [facility] SERVICES [address] [place], [place], [address] Phone: [phone]   Fax:  [phone]  Name: CHAZLYN CUDE MRN: [id number] Date of Birth: 15-May-1951

## 2016-07-03 ENCOUNTER — Ambulatory Visit: Payer: BLUE CROSS/BLUE SHIELD | Attending: Otolaryngology

## 2016-07-03 DIAGNOSIS — R42 Dizziness and giddiness: Secondary | ICD-10-CM | POA: Diagnosis present

## 2016-07-03 NOTE — Therapy (Signed)
[place] MAIN [facility] SERVICES [address] [name], [place], [address] Phone: [phone]   Fax:  [phone]  Physical Therapy Treatment  Patient Details  Name: Lynn Mann MRN: [id number] Date of Birth: October 30, 1950 Referring Provider: Dr. Carmin Richmond  Encounter Date: 07/03/2016      PT End of Session - 07/03/16 1352    Visit Number 8   Number of Visits 13   Date for PT Re-Evaluation 07/25/16   PT Start Time 4287   PT Stop Time 1432   PT Time Calculation (min) 40 min   Equipment Utilized During Treatment Gait belt   Activity Tolerance Patient tolerated treatment well   Behavior During Therapy [facility] for tasks assessed/performed      Past Medical History:  Diagnosis Date  . Arthritis   . Chicken pox   . Cholecystitis 11/2011   Did not require sgy - Dr. Staci Acosta - [facility]  (cholelithiasis)  . H/O Clostridium difficile infection   . IBS (irritable bowel syndrome)   . MRSA exposure 2005   Spider bite  . MVP (mitral valve prolapse)    Stable - Dr. Ubaldo Glassing  . Rheumatic fever     Past Surgical History:  Procedure Laterality Date  . CATARACT EXTRACTION  [id number]  . MUSCLE BIOPSY      There were no vitals filed for this visit.      Subjective Assessment - 07/03/16 1352    Subjective Pt reports that she is having a good day today with respect to dizziness. She states that she stayed up all night last night preparing to leave for [facility] tomorrow morning at 6:00 AM. No specific questions or concerns at this time.    Pertinent History Patient reports that she began to have dizziness issues in May 2017. Patient states a week before this episode she had bronchitis and fluid in her ears and she was put on steroids. Pt states she had finisher her steroids on Thursday. Pt states she got "seasick" after she drove with her husband to [facility] on Friday. Pt states she felt her "stomach drop" like on an amusement park ride and then "colors were  swirling in front of my face". Pt states her husband said she passed out and fell. Patient does not remember falling or passing out. Patient states she felt there was a "glass circle" around her vision and that her vision looked like she was looking through an old rippled window. Patient states EMS was called and evaluated her, but she did not go to the hospital. Patient states that she did not go to the hospital, but went to an acute care place for care. Pt states that she was put on a Z pack as she still had fluid in the ears. Pt states the a week and a half later she woke up with dizziness and went to the [facility]. Patient said she had a CT head scan of brain which was normal. Patient was referred to a cardiologist.Patient states she had halter monitor, EKG and echocardiogram testing and patient reports all tests were fine. Dr. Nicki Reaper referred patient to ENT and pulmonologist phyisicians. Patient has been seen by ENT and had VNG testing. Per MR, VNG testing revealed mild left sided deficits. Patient reports she has pulmonary function tests ordered for mid October. Patient reports that over 20 years ago she double vision and describes possible convergence issues. Pt states that they tried prisms in her glasses but patient  states that did not help the problem. Pt states she felt the issue resolved with time on its own. Pt reports that she had labyrinthitis in her 79s. Patient states she did not have PT at that time and states she "bounced back pretty well" from that and it only bothered her for about 1 week.    Patient Stated Goals to decrease dizziness   Currently in Pain? No/denies        TREATMENT   Hallway ball toss In hallway, worked on ball tosses vertical and bouncing to self 75' x 2 each, mild lateral gait deviation noted with this;  Ball toss over shoulder: Patient performed multiple 64' trials of retro ambulation while tossing ball over one shoulder with return catch over  opposite shoulder varying the ball position to head, shoulder and waist level to promote head turning and tilting.Patient with significant lateral gait deviation and unsteadiness noted with this activity;   Ball Toss Standing in doorway with feet together performed 1 minute x multiple repetitions of ball tosses against wall followed by quick turns at waist and ball toss against opposite wall. Pt with multiple episodes of missing ball, pt reports increase in nausea with this activity;  Body Wall Rolls Patient performed multiple reps of supported, body wall rolls with eyes open. Patient reports unsteadiness with this activity but dizziness;  VOR x 1 exercise: With forward/retro walking pt performed VOR X 1 horiz with conflicting background 35' x 3reps of 1 minute;  Airex Balance Beam Side stepping on Airex balance beam x 6 lengths; Side stepping on Airex balance beam with horizontal head turns x 8 lengths;  Airex Static eyes closed balance with feet together 30 seconds x 2; Eyes closed balance with feet apart and performing slow marches 30 seconds x 2; Stepping stone taps in 1, 2, and 3 stone sequences as called out by therapist; Single leg balance x 30 seconds each;                        PT Education - 07/03/16 1352    Education provided Yes   Education Details Reinforced HEP   Person(s) Educated Patient   Methods Explanation   Comprehension Verbalized understanding             PT Long Term Goals - 06/26/16 1429      PT LONG TERM GOAL #1   Title Patient will be able to perform home program independently for self-management by 07/02/16.   Baseline 06/26/16: not performing consistently   Time 8   Period Weeks   Status Achieved     PT LONG TERM GOAL #2   Title Patient will have demonstrate decreased falls risk as indicated by Activities Specific Balance Confidence Scale score of 80% or greater by 07/02/16.   Baseline scored 74% on 05/07/16; 06/26/16:  65%   Time 8   Period Weeks   Status On-going     PT LONG TERM GOAL #3   Title Patient will report 50% or greater improvement in her symptoms of dizziness and imbalance with provoking motions or positions by 07/02/16.   Baseline 06/26/16: 75% reported improvement   Time 8   Period Weeks   Status Partially Met     PT LONG TERM GOAL #4   Title Patient will report 3/10 dizziness or less with ambulating up/down stairs and inclines in order to be able to move about the community safely by 07/02/2016.   Baseline 06/26/16: will not  rate severity despite repeated questioning, states she avoids stairs as she feels like she will miss her step   Time 8   Period Weeks   Status On-going               Plan - 07/03/16 1434    Clinical Impression Statement Pt demonstrates some lateral gait deviations with head turns during ambulation. She also reports some nausea with alternating quick ball tosses with body turns in doorway. Pt encouraged to bring a video camera at her request to next session to video some of her exercises for easier rememberance after discharge. Encouraged to continue HEP. No progression provided at this time.    Clinical Impairments Affecting Rehab Potential Positive Indicators: motivated, family support, symptoms have been improving since onset   Negative Indicators: history of motion sensitivity      Patient will benefit from skilled therapeutic intervention in order to improve the following deficits and impairments:  Decreased balance, Dizziness  Visit Diagnosis: Dizziness and giddiness     Problem List Patient Active Problem List   Diagnosis Date Noted  . Dizziness 04/01/2016  . SOB (shortness of breath) 04/01/2016  . Bilateral shoulder pain 02/24/2016  . Fatigue 01/28/2016  . Neck fullness 01/28/2016  . Routine general medical examination at a health care facility 07/25/2015  . Health care maintenance 10/09/2014  . Foot pain 06/19/2014  . Diarrhea 04/08/2014   . Hyperglycemia 02/07/2014  . Neck pain 11/26/2013  . Hypercholesterolemia 11/26/2013  . History of colonic polyps 05/11/2013  . Hyperbilirubinemia 01/18/2013  . GERD (gastroesophageal reflux disease) 12/03/2012  . Pneumonia 11/27/2012  . Cholelithiasis 11/07/2012  . IBS (irritable bowel syndrome) 11/07/2012  . History of rheumatic fever 08/28/2012  . MVP (mitral valve prolapse) 08/28/2012   Phillips Grout PT, DPT   Huprich,Jason 07/03/2016, 2:38 PM  [place] MAIN [facility] SERVICES [address] [place], [place], [address] Phone: [phone]   Fax:  [phone]  Name: MIKE BERNTSEN MRN: [id number] Date of Birth: 1950/12/01

## 2016-07-04 ENCOUNTER — Encounter: Payer: BLUE CROSS/BLUE SHIELD | Admitting: Internal Medicine

## 2016-07-10 ENCOUNTER — Ambulatory Visit: Payer: BLUE CROSS/BLUE SHIELD

## 2016-07-10 DIAGNOSIS — R42 Dizziness and giddiness: Secondary | ICD-10-CM | POA: Diagnosis not present

## 2016-07-10 NOTE — Therapy (Signed)
[place] MAIN [facility] SERVICES [address] [place], [place], [address] Phone: [phone]   Fax:  [phone]  Physical Therapy Treatment  Patient Details  Name: Lynn Mann MRN: [id number] Date of Birth: Dec 25, 1950 Referring Provider: Dr. Carmin Richmond  Encounter Date: 07/10/2016      PT End of Session - 07/10/16 1400    Visit Number 9   Number of Visits 13   Date for PT Re-Evaluation 07/25/16   PT Start Time [id number]   PT Stop Time 1430   PT Time Calculation (min) 33 min   Equipment Utilized During Treatment Gait belt   Activity Tolerance Patient tolerated treatment well   Behavior During Therapy [facility] for tasks assessed/performed      Past Medical History:  Diagnosis Date  . Arthritis   . Chicken pox   . Cholecystitis 11/2011   Did not require sgy - Dr. Staci Acosta - [facility]  (cholelithiasis)  . H/O Clostridium difficile infection   . IBS (irritable bowel syndrome)   . MRSA exposure 2005   Spider bite  . MVP (mitral valve prolapse)    Stable - Dr. Ubaldo Glassing  . Rheumatic fever     Past Surgical History:  Procedure Laterality Date  . CATARACT EXTRACTION  [id number]  . MUSCLE BIOPSY      There were no vitals filed for this visit.      Subjective Assessment - 07/10/16 1358    Subjective Pt reports that she was in [place] last weekend and had no problem at all with the drive. Overall she reports no severe dizziness and states that she is finding changing lanes easier with head turns. Reports some recent worsening of swaying/unsteadiness. She states that has not performed her HEP.    Pertinent History Patient reports that she began to have dizziness issues in May 2017. Patient states a week before this episode she had bronchitis and fluid in her ears and she was put on steroids. Pt states she had finisher her steroids on Thursday. Pt states she got "seasick" after she drove with her husband to [facility] on Friday. Pt states she felt her  "stomach drop" like on an amusement park ride and then "colors were swirling in front of my face". Pt states her husband said she passed out and fell. Patient does not remember falling or passing out. Patient states she felt there was a "glass circle" around her vision and that her vision looked like she was looking through an old rippled window. Patient states EMS was called and evaluated her, but she did not go to the hospital. Patient states that she did not go to the hospital, but went to an acute care place for care. Pt states that she was put on a Z pack as she still had fluid in the ears. Pt states the a week and a half later she woke up with dizziness and went to the [facility]. Patient said she had a CT head scan of brain which was normal. Patient was referred to a cardiologist.Patient states she had halter monitor, EKG and echocardiogram testing and patient reports all tests were fine. Dr. Nicki Reaper referred patient to ENT and pulmonologist phyisicians. Patient has been seen by ENT and had VNG testing. Per MR, VNG testing revealed mild left sided deficits. Patient reports she has pulmonary function tests ordered for mid October. Patient reports that over 20 years ago she double vision and describes possible convergence issues. Pt states that  they tried prisms in her glasses but patient states that did not help the problem. Pt states she felt the issue resolved with time on its own. Pt reports that she had labyrinthitis in her 37s. Patient states she did not have PT at that time and states she "bounced back pretty well" from that and it only bothered her for about 1 week.    Patient Stated Goals to decrease dizziness   Currently in Pain? No/denies           TREATMENT   Patient arrived late for appointment so session abbreviated accordingly.  Entire session focused on HEP review and patient video recording therapist performing and explaining exercises and progressions for easier  recall. Pt has difficulty remembering exercises and requests videos to help with performance after discharge;  While pt is video recording performed:  VOR x 1 horizontal and vertical with progression from sitting to standing (feet apart then together) to compliant surface to walking with both plain and patterned backgrounds  Semitandem to full tandem and then single leg progressions with horizontal/vertical head turns and horizontal head/body turns;  Ball tosses to self horizontal and vertical static in a variety of foot positions progressing to forward/retro ambulation;  Body rolls supported eyes open progressing to unsupported eyes closed;  Seated horizontal, vertical and diagonal saccades;  Seated smooth pursuits horizontal, vertical, and diagonal;                      PT Education - 07/10/16 1400    Education provided Yes   Education Details Extensive HEP review   Person(s) Educated Patient   Methods Explanation;Demonstration;Verbal cues;Other (comment)  Pt recorded videos   Comprehension Verbalized understanding             PT Long Term Goals - 07/10/16 1710      PT LONG TERM GOAL #1   Title Patient will be able to perform home program independently for self-management by 07/02/16.   Baseline 06/26/16: not performing consistently   Time 8   Period Weeks   Status Achieved     PT LONG TERM GOAL #2   Title Patient will have demonstrate decreased falls risk as indicated by Activities Specific Balance Confidence Scale score of 80% or greater by 07/02/16.   Baseline scored 74% on 05/07/16; 06/26/16: 65%   Time 8   Period Weeks   Status On-going     PT LONG TERM GOAL #3   Title Patient will report 50% or greater improvement in her symptoms of dizziness and imbalance with provoking motions or positions by 07/02/16.   Baseline 06/26/16: 75% reported improvement   Time 8   Period Weeks   Status Achieved     PT LONG TERM GOAL #4   Title Patient will  report 3/10 dizziness or less with ambulating up/down stairs and inclines in order to be able to move about the community safely by 07/02/2016.   Baseline 06/26/16: will not rate severity despite repeated questioning, states she avoids stairs as she feels like she will miss her step   Time 8   Period Weeks   Status On-going               Plan - 07/10/16 1401    Clinical Impression Statement Pt arrived late so her appointment was abbreviated accordingly. Pt requesting that session focus on video recording exercises as she struggle with recall for HEP. Session focused on re-teaching patient all of her HEP while she  recorded video on her tablet. Pt encouraged to perform HEP and follow-up as scheduled. At next session will complete outcome measures, update goals, and discharge patient.    Rehab Potential Good   Clinical Impairments Affecting Rehab Potential Positive Indicators: motivated, family support, symptoms have been improving since onset   Negative Indicators: history of motion sensitivity   PT Frequency 1x / week   PT Duration 4 weeks   PT Treatment/Interventions Canalith Repostioning;Neuromuscular re-education;Balance training;Patient/family education;Vestibular;Therapeutic activities;Therapeutic exercise   PT Next Visit Plan Outcome measures, update goals, discharge. HEP extensively reviewed during this session.    PT Home Exercise Plan VOR X 1 in standing with feet together horizontal 50% and vertical 50% of the time, semitandem compliant surface horizontal head turns, seated horizontal/vertical/diagonal saccades; seated ball toss to self horiz and vert with head and eye following/tracking ball; body wall rolls   Consulted and Agree with Plan of Care Patient      Patient will benefit from skilled therapeutic intervention in order to improve the following deficits and impairments:     Visit Diagnosis: Dizziness and giddiness     Problem List Patient Active Problem List    Diagnosis Date Noted  . Dizziness 04/01/2016  . SOB (shortness of breath) 04/01/2016  . Bilateral shoulder pain 02/24/2016  . Fatigue 01/28/2016  . Neck fullness 01/28/2016  . Routine general medical examination at a health care facility 07/25/2015  . Health care maintenance 10/09/2014  . Foot pain 06/19/2014  . Diarrhea 04/08/2014  . Hyperglycemia 02/07/2014  . Neck pain 11/26/2013  . Hypercholesterolemia 11/26/2013  . History of colonic polyps 05/11/2013  . Hyperbilirubinemia 01/18/2013  . GERD (gastroesophageal reflux disease) 12/03/2012  . Pneumonia 11/27/2012  . Cholelithiasis 11/07/2012  . IBS (irritable bowel syndrome) 11/07/2012  . History of rheumatic fever 08/28/2012  . MVP (mitral valve prolapse) 08/28/2012   Phillips Grout PT, DPT   Huprich,Jason 07/10/2016, 5:17 PM  [place] MAIN [facility] SERVICES [address] [place], [place], [address] Phone: [phone]   Fax:  [phone]  Name: KIELYNN GOIN MRN: [id number] Date of Birth: [birth year]

## 2016-07-12 ENCOUNTER — Telehealth: Payer: Self-pay | Admitting: *Deleted

## 2016-07-12 NOTE — Telephone Encounter (Signed)
Patient requested a call, she questions in reference to her upcoming appt on 12/18 Pt contact [phone]

## 2016-07-12 NOTE — Telephone Encounter (Signed)
Pt requested a call in reference

## 2016-07-15 ENCOUNTER — Encounter: Payer: Self-pay | Admitting: Internal Medicine

## 2016-07-15 ENCOUNTER — Other Ambulatory Visit (HOSPITAL_COMMUNITY)
Admission: RE | Admit: 2016-07-15 | Discharge: 2016-07-15 | Disposition: A | Discharge status: Home / Self Care | Payer: BLUE CROSS/BLUE SHIELD | Source: Ambulatory Visit | Attending: Internal Medicine | Admitting: Internal Medicine

## 2016-07-15 ENCOUNTER — Ambulatory Visit (INDEPENDENT_AMBULATORY_CARE_PROVIDER_SITE_OTHER): Payer: BLUE CROSS/BLUE SHIELD | Admitting: Internal Medicine

## 2016-07-15 VITALS — BP 124/68 | HR 79 | Temp 97.8°F | Ht 67.0 in | Wt 190.4 lb

## 2016-07-15 DIAGNOSIS — R42 Dizziness and giddiness: Secondary | ICD-10-CM

## 2016-07-15 DIAGNOSIS — E7800 Pure hypercholesterolemia, unspecified: Secondary | ICD-10-CM | POA: Diagnosis not present

## 2016-07-15 DIAGNOSIS — Z0000 Encounter for general adult medical examination without abnormal findings: Secondary | ICD-10-CM

## 2016-07-15 DIAGNOSIS — R32 Unspecified urinary incontinence: Secondary | ICD-10-CM

## 2016-07-15 DIAGNOSIS — Z01419 Encounter for gynecological examination (general) (routine) without abnormal findings: Secondary | ICD-10-CM | POA: Diagnosis present

## 2016-07-15 DIAGNOSIS — Z1151 Encounter for screening for human papillomavirus (HPV): Secondary | ICD-10-CM | POA: Diagnosis present

## 2016-07-15 DIAGNOSIS — Z86010 Personal history of colonic polyps: Secondary | ICD-10-CM

## 2016-07-15 DIAGNOSIS — J4520 Mild intermittent asthma, uncomplicated: Secondary | ICD-10-CM

## 2016-07-15 DIAGNOSIS — R739 Hyperglycemia, unspecified: Secondary | ICD-10-CM

## 2016-07-15 LAB — LIPID PANEL
Cholesterol: 217 mg/dL — ABNORMAL HIGH (ref 0–200)
HDL: 41.7 mg/dL (ref >39.00)
LDL Cholesterol: 155 mg/dL — ABNORMAL HIGH (ref 0–99)
NonHDL: 175.19
Total CHOL/HDL Ratio: 5
Triglycerides: 102 mg/dL (ref 0.0–149.0)
VLDL: 20.4 mg/dL (ref 0.0–40.0)

## 2016-07-15 LAB — HEPATIC FUNCTION PANEL
ALT: 17 U/L (ref 0–35)
AST: 21 U/L (ref 0–37)
Albumin: 4.5 g/dL (ref 3.5–5.2)
Alkaline Phosphatase: 55 U/L (ref 39–117)
Bilirubin, Direct: 0.2 mg/dL (ref 0.0–0.3)
Total Bilirubin: 1.4 mg/dL — ABNORMAL HIGH (ref 0.2–1.2)
Total Protein: 6.8 g/dL (ref 6.0–8.3)

## 2016-07-15 LAB — BASIC METABOLIC PANEL
BUN: 14 mg/dL (ref 6–23)
CO2: 25 mEq/L (ref 19–32)
Calcium: 9.5 mg/dL (ref 8.4–10.5)
Chloride: 105 mEq/L (ref 96–112)
Creatinine, Ser: 0.68 mg/dL (ref 0.40–1.20)
GFR: 92.63 mL/min (ref >60.00)
Glucose, Bld: 87 mg/dL (ref 70–99)
Potassium: 4.6 mEq/L (ref 3.5–5.1)
Sodium: 141 mEq/L (ref 135–145)

## 2016-07-15 NOTE — Telephone Encounter (Signed)
Seen 07/15/16

## 2016-07-15 NOTE — Progress Notes (Signed)
Patient ID: Lynn Mann, female   DOB: June 22, 1951, 65 y.o.   MRN: [id number]   Subjective:    Patient ID: Lynn Mann, female    DOB: October 17, 1950, 65 y.o.   MRN: [id number]  HPI  Patient here for her physical exam.  Seeing Dr Alva Garnet for her mild asthma.  Was just evaluated.  Taking breo.  Also uses albuterol prn.  Breathing stable.  Has been doing vestibular therapy.  This has helped her significantly. Feeling better.  Has decreased carb intake.  Drinking more water.  No chest pain.  No acid reflux.  No abdominal pain.  Does report some urinary incontinence.  Some leakage.  Worse over the last 3-4 months.  Request pelvic floor therapy.  No vaginal symptoms.     Past Medical History:  Diagnosis Date  . Arthritis   . Chicken pox   . Cholecystitis 11/2011   Did not require sgy - Dr. Staci Acosta - [facility]  (cholelithiasis)  . H/O Clostridium difficile infection   . IBS (irritable bowel syndrome)   . MRSA exposure 2005   Spider bite  . MVP (mitral valve prolapse)    Stable - Dr. Ubaldo Glassing  . Rheumatic fever    Past Surgical History:  Procedure Laterality Date  . CATARACT EXTRACTION  [id number]  . MUSCLE BIOPSY     Family History  Problem Relation Age of Onset  . Arthritis Mother   . Stroke Mother   . Diabetes Mother   . Hypertension Mother   . Arthritis Father   . Stroke Father   . Diabetes Paternal Grandmother   . Cancer Paternal Uncle     colon  . Breast cancer Maternal Aunt   . Heart disease      maternal and paternal side  . Breast cancer Paternal Aunt   . Breast cancer Cousin   . Breast cancer Cousin     female cousin   Social History   Social History  . Marital status: Married    Spouse name: N/A  . Number of children: N/A  . Years of education: 42   Occupational History  . Retired    Social History Main Topics  . Smoking status: Never Smoker  . Smokeless tobacco: Never Used  . Alcohol use 0.0 oz/week     Comment: Rarely  . Drug use: No  . Sexual activity:  Yes    Partners: Male    Birth control/ protection: Post-menopausal   Other Topics Concern  . None   Social History Narrative  . None    Outpatient Encounter Prescriptions as of 07/15/2016  Medication Sig  . fluticasone furoate-vilanterol (BREO ELLIPTA) 100-25 MCG/INH AEPB Inhale 1 puff into the lungs daily.  . sodium chloride (OCEAN) 0.65 % SOLN nasal spray Place 2 sprays into both nostrils every 2 (two) hours while awake.  . albuterol (PROVENTIL HFA;VENTOLIN HFA) 108 (90 Base) MCG/ACT inhaler Inhale 2 puffs into the lungs every 6 (six) hours as needed for wheezing or shortness of breath.  . fluticasone (FLONASE) 50 MCG/ACT nasal spray Place 1 spray into both nostrils 2 (two) times daily.   No facility-administered encounter medications on file as of 07/15/2016.     Review of Systems  Constitutional: Negative for appetite change and unexpected weight change.  HENT: Negative for congestion and sinus pressure.   Eyes: Negative for pain and visual disturbance.  Respiratory: Negative for cough, chest tightness and shortness of breath.   Cardiovascular: Negative for chest pain, palpitations and  leg swelling.  Gastrointestinal: Negative for abdominal pain, diarrhea, nausea and vomiting.  Genitourinary: Negative for difficulty urinating and dysuria.       Some urinary incontinence.    Musculoskeletal: Negative for back pain and joint swelling.  Skin: Negative for color change and rash.  Neurological: Negative for headaches.       Dizziness/light headedness improved.    Hematological: Negative for adenopathy. Does not bruise/bleed easily.  Psychiatric/Behavioral: Negative for agitation and dysphoric mood.       Objective:    Physical Exam  Constitutional: She is oriented to person, place, and time. She appears well-developed and well-nourished. No distress.  HENT:  Nose: Nose normal.  Mouth/Throat: Oropharynx is clear and moist.  Eyes: Right eye exhibits no discharge. Left eye  exhibits no discharge. No scleral icterus.  Neck: Neck supple. No thyromegaly present.  Cardiovascular: Normal rate and regular rhythm.   Pulmonary/Chest: Breath sounds normal. No accessory muscle usage. No tachypnea. No respiratory distress. She has no decreased breath sounds. She has no wheezes. She has no rhonchi. Right breast exhibits no inverted nipple, no mass, no nipple discharge and no tenderness (no axillary adenopathy). Left breast exhibits no inverted nipple, no mass, no nipple discharge and no tenderness (no axilarry adenopathy).  Abdominal: Soft. Bowel sounds are normal. There is no tenderness.  Genitourinary:  Genitourinary Comments: Normal external genitalia.  Vaginal vault without lesions.  Cervix identified.  Pap smear performed.  Could not appreciate any adnexal masses or tenderness.    Musculoskeletal: She exhibits no edema or tenderness.  Lymphadenopathy:    She has no cervical adenopathy.  Neurological: She is alert and oriented to person, place, and time.  Skin: Skin is warm. No rash noted. No erythema.  Psychiatric: She has a normal mood and affect. Her behavior is normal.    BP 124/68   Pulse 79   Temp 97.8 F (36.6 C) (Oral)   Ht 5\' 7"  (1.702 m)   Wt 190 lb 6.4 oz (86.4 kg)   SpO2 98%   BMI 29.82 kg/m  Wt Readings from Last 3 Encounters:  07/15/16 190 lb 6.4 oz (86.4 kg)  05/17/16 193 lb 9.6 oz (87.8 kg)  04/09/16 197 lb 3.2 oz (89.4 kg)     Lab Results  Component Value Date   WBC 5.7 01/23/2016   HGB 13.9 01/23/2016   HCT 41.3 01/23/2016   PLT 291.0 01/23/2016   GLUCOSE 87 07/15/2016   CHOL 217 (H) 07/15/2016   TRIG 102.0 07/15/2016   HDL 41.70 07/15/2016   LDLDIRECT 124.3 01/18/2013   LDLCALC 155 (H) 07/15/2016   ALT 17 07/15/2016   AST 21 07/15/2016   NA 141 07/15/2016   K 4.6 07/15/2016   CL 105 07/15/2016   CREATININE 0.68 07/15/2016   BUN 14 07/15/2016   CO2 25 07/15/2016   TSH 2.39 01/23/2016   HGBA1C 5.8 07/25/2015    Ct Soft  Tissue Neck W Contrast  Result Date: 03/01/2016 CLINICAL DATA:  Right neck mass and fullness on physical exam. Asymptomatic. EXAM: CT NECK WITH CONTRAST TECHNIQUE: Multidetector CT imaging of the neck was performed using the standard protocol following the bolus administration of intravenous contrast. CONTRAST:  75 cc Isovue 300 intravenous COMPARISON:  None. FINDINGS: Pharynx and larynx: No suspicious asymmetry or enhancement Salivary glands: Negative Thyroid: Negative Lymph nodes: None enlarged or heterogeneous Vascular: Negative Limited intracranial: Negative Visualized orbits: Bilateral cataract resection.  Otherwise negative Mastoids and visualized paranasal sinuses: Clear Skeleton: Cervical dextrocurvature with  facet spurring. No acute or aggressive process. Upper chest: Clear apical lungs. Negative supraclavicular fossa state. IMPRESSION: Negative exam.  No explanation for palpable complaint. Electronically Signed   By: Monte Fantasia M.D.   On: 03/01/2016 13:50       Assessment & Plan:   Problem List Items Addressed This Visit    Asthma    Seeing Dr Alva Garnet.  Breathing better.  Follow.       Dizziness    Evaluated by ENT.  Vestibular therapy.  Improved.  Feels better.       Health care maintenance    Physical today 07/15/16.  PAP 07/15/16.  Colonoscopy 05/05/13.  Mammogram 02/15/16 - birads I.       Relevant Orders   Cytology - PAP   History of colonic polyps    Colonoscopy 04/2013.        Hyperbilirubinemia    Recheck liver panel.        Hypercholesterolemia    Low cholesterol diet and exercise.  Follow lipid panel.        Relevant Orders   Hepatic function panel (Completed)   Lipid panel (Completed)   Hyperglycemia    Low carb diet and exercise.  She declines to have a1c drawn.  States insurance does not cover.        Relevant Orders   Basic metabolic panel (Completed)   Urinary incontinence - Primary    Request referral for pelvic floor exercises/therapy.   Follow.        Relevant Orders   Ambulatory referral to Physical Therapy       Einar Pheasant, MD

## 2016-07-15 NOTE — Progress Notes (Signed)
Pre visit review using our clinic review tool, if applicable. No additional management support is needed unless otherwise documented below in the visit note. 

## 2016-07-15 NOTE — Assessment & Plan Note (Signed)
Physical today 07/15/16.  PAP 07/15/16.  Colonoscopy 05/05/13.  Mammogram 02/15/16 - birads I.

## 2016-07-16 ENCOUNTER — Other Ambulatory Visit: Payer: Self-pay | Admitting: Internal Medicine

## 2016-07-16 ENCOUNTER — Encounter: Payer: Self-pay | Admitting: Internal Medicine

## 2016-07-16 DIAGNOSIS — J45909 Unspecified asthma, uncomplicated: Secondary | ICD-10-CM | POA: Insufficient documentation

## 2016-07-16 DIAGNOSIS — R32 Unspecified urinary incontinence: Secondary | ICD-10-CM | POA: Insufficient documentation

## 2016-07-16 NOTE — Assessment & Plan Note (Signed)
Low cholesterol diet and exercise.  Follow lipid panel.   

## 2016-07-16 NOTE — Assessment & Plan Note (Signed)
Colonoscopy 04/2013.

## 2016-07-16 NOTE — Assessment & Plan Note (Signed)
Request referral for pelvic floor exercises/therapy.  Follow.

## 2016-07-16 NOTE — Assessment & Plan Note (Signed)
Seeing Dr Alva Garnet.  Breathing better.  Follow.

## 2016-07-16 NOTE — Assessment & Plan Note (Signed)
Low carb diet and exercise.  She declines to have a1c drawn.  States insurance does not cover.

## 2016-07-16 NOTE — Assessment & Plan Note (Signed)
Recheck liver panel.  

## 2016-07-16 NOTE — Progress Notes (Signed)
Order placed for f/u liver panel.  

## 2016-07-16 NOTE — Assessment & Plan Note (Signed)
Evaluated by ENT.  Vestibular therapy.  Improved.  Feels better.

## 2016-07-17 ENCOUNTER — Ambulatory Visit: Payer: BLUE CROSS/BLUE SHIELD

## 2016-07-17 DIAGNOSIS — R42 Dizziness and giddiness: Secondary | ICD-10-CM

## 2016-07-17 LAB — CYTOLOGY - PAP
Diagnosis: NEGATIVE
HPV: NOT DETECTED

## 2016-07-17 NOTE — Therapy (Signed)
[place] [facility] SERVICES [address] [place], [place], [address] Phone: [phone]   Fax:  [phone]  Physical Therapy Treatment/Discharge  Patient Details  Name: Lynn Mann MRN: [id number] Date of Birth: 1951/06/18 Referring Provider: Dr. Carmin Richmond  Encounter Date: 07/17/2016      PT End of Session - 07/17/16 1132    Visit Number 10   Number of Visits 13   Date for PT Re-Evaluation 07/25/16   PT Start Time 1130   PT Stop Time 1200   PT Time Calculation (min) 30 min   Equipment Utilized During Treatment Gait belt   Activity Tolerance Patient tolerated treatment well   Behavior During Therapy [facility] for tasks assessed/performed      Past Medical History:  Diagnosis Date  . Arthritis   . Chicken pox   . Cholecystitis 11/2011   Did not require sgy - Dr. Staci Acosta - [facility]  (cholelithiasis)  . H/O Clostridium difficile infection   . IBS (irritable bowel syndrome)   . MRSA exposure 2005   Spider bite  . MVP (mitral valve prolapse)    Stable - Dr. Ubaldo Glassing  . Rheumatic fever     Past Surgical History:  Procedure Laterality Date  . CATARACT EXTRACTION  [id number]  . MUSCLE BIOPSY      There were no vitals filed for this visit.      Subjective Assessment - 07/17/16 1131    Subjective Pt again arrived late for her appointment. She reports that she "woke up reeling" this morning with increased dizziness. She continues to be mostly noncompliant with HEP however does reports she performed "a couple times" this week. Pt reports she feels approximately 50-60% improved since starting therapy. No specific questions or concerns at this time.    Pertinent History Patient reports that she began to have dizziness issues in May 2017. Patient states a week before this episode she had bronchitis and fluid in her ears and she was put on steroids. Pt states she had finisher her steroids on Thursday. Pt states she got "seasick" after she drove  with her husband to [facility] on Friday. Pt states she felt her "stomach drop" like on an amusement park ride and then "colors were swirling in front of my face". Pt states her husband said she passed out and fell. Patient does not remember falling or passing out. Patient states she felt there was a "glass circle" around her vision and that her vision looked like she was looking through an old rippled window. Patient states EMS was called and evaluated her, but she did not go to the hospital. Patient states that she did not go to the hospital, but went to an acute care place for care. Pt states that she was put on a Z pack as she still had fluid in the ears. Pt states the a week and a half later she woke up with dizziness and went to the [facility]. Patient said she had a CT head scan of brain which was normal. Patient was referred to a cardiologist.Patient states she had halter monitor, EKG and echocardiogram testing and patient reports all tests were fine. Dr. Nicki Reaper referred patient to ENT and pulmonologist phyisicians. Patient has been seen by ENT and had VNG testing. Per MR, VNG testing revealed mild left sided deficits. Patient reports she has pulmonary function tests ordered for mid October. Patient reports that over 20 years ago she double vision and describes possible  convergence issues. Pt states that they tried prisms in her glasses but patient states that did not help the problem. Pt states she felt the issue resolved with time on its own. Pt reports that she had labyrinthitis in her 74s. Patient states she did not have PT at that time and states she "bounced back pretty well" from that and it only bothered her for about 1 week.    Patient Stated Goals to decrease dizziness   Currently in Pain? No/denies            [facility] PT Assessment - 07/17/16 1146      Observation/Other Assessments   Other Surveys  Other Surveys   Activities of Balance Confidence Scale (ABC Scale)  78.43%    Dizziness Handicap Inventory [facility])  26/100          Neuromuscular Re-education:  ABC and DHI completed by patient (unbilled), pt takes an extended time to complete on this date (>15 minutes); Interpreted and discussed results of ABC and [place] with patient; Perform DGI with patient who scored 21/24 (21/24 at initial evaluation), she continues to demonstrate lateral gait deviation with horizontal and vertical head turns and use of rails with stairs Updated goals with patient; Extensive HEP review with patient, education about importance of HEP going forward and encouragement to follow-up with ENT and PCP as instructed;        PT Education - 07/17/16 1132    Education provided Yes   Education Details Reinforced importance of HEP, discharge   Person(s) Educated Patient   Methods Explanation   Comprehension Verbalized understanding             PT Long Term Goals - 07/17/16 1153      PT LONG TERM GOAL #1   Title Patient will be able to perform home program independently for self-management by 07/02/16.   Baseline 06/26/16: not performing consistently   Time 8   Period Weeks   Status Achieved     PT LONG TERM GOAL #2   Title Patient will have demonstrate decreased falls risk as indicated by Activities Specific Balance Confidence Scale score of 80% or greater by 07/02/16.   Baseline scored 74% on 05/07/16; 06/26/16: 65%, 07/17/16: 78.43%   Time 8   Period Weeks   Status On-going     PT LONG TERM GOAL #3   Title Patient will report 50% or greater improvement in her symptoms of dizziness and imbalance with provoking motions or positions by 07/02/16.   Baseline 06/26/16: 75% reported improvement, 07/17/16: 50-60% improved   Time 8   Period Weeks   Status Achieved     PT LONG TERM GOAL #4   Title Patient will report 3/10 dizziness or less with ambulating up/down stairs and inclines in order to be able to move about the community safely by 07/02/2016.   Baseline 06/26/16:  will not rate severity despite repeated questioning, states she avoids stairs as she feels like she will miss her step, 07/17/16: no dizziness but "unsteadiness" and complains of "vagueness"   Time 8   Period Weeks   Status Achieved               Plan - 07/17/16 1132    Clinical Impression Statement Pt will be discharged on this date having met all of her goals with the exception of continued balance confidence below goal of 80%. She reports approximately 50-60% symptom reduction since starting therapy. She has been consistently late for her appointments and non-compliant  with her HEP throughout the course of her therapy. Pt is well-versed on her HEP and even has videos of therapist demonstrating her exercises. Pt encouraged to continue HEP at home and follow-up with primary MD and ENT as scheduled.    Rehab Potential Good   Clinical Impairments Affecting Rehab Potential Positive Indicators: motivated, family support, symptoms have been improving since onset   Negative Indicators: history of motion sensitivity   PT Frequency 1x / week   PT Duration 4 weeks   PT Treatment/Interventions Canalith Repostioning;Neuromuscular re-education;Balance training;Patient/family education;Vestibular;Therapeutic activities;Therapeutic exercise   PT Next Visit Plan Discharge   PT Home Exercise Plan VOR X 1 in standing with feet together horizontal 50% and vertical 50% of the time, semitandem compliant surface horizontal head turns, seated horizontal/vertical/diagonal saccades; seated ball toss to self horiz and vert with head and eye following/tracking ball; body wall rolls   Consulted and Agree with Plan of Care Patient      Patient will benefit from skilled therapeutic intervention in order to improve the following deficits and impairments:  Decreased balance, Dizziness  Visit Diagnosis: Dizziness and giddiness     Problem List Patient Active Problem List   Diagnosis Date Noted  . Asthma  07/16/2016  . Urinary incontinence 07/16/2016  . Dizziness 04/01/2016  . SOB (shortness of breath) 04/01/2016  . Bilateral shoulder pain 02/24/2016  . Fatigue 01/28/2016  . Neck fullness 01/28/2016  . Routine general medical examination at a health care facility 07/25/2015  . Health care maintenance 10/09/2014  . Foot pain 06/19/2014  . Diarrhea 04/08/2014  . Hyperglycemia 02/07/2014  . Neck pain 11/26/2013  . Hypercholesterolemia 11/26/2013  . History of colonic polyps 05/11/2013  . Hyperbilirubinemia 01/18/2013  . GERD (gastroesophageal reflux disease) 12/03/2012  . Pneumonia 11/27/2012  . Cholelithiasis 11/07/2012  . IBS (irritable bowel syndrome) 11/07/2012  . History of rheumatic fever 08/28/2012  . MVP (mitral valve prolapse) 08/28/2012   Phillips Grout PT, DPT   Lunna Vogelgesang 07/17/2016, 4:16 PM  [place] [facility] SERVICES [address] [place], [place], [address] Phone: [phone]   Fax:  [phone]  Name: Lynn Mann MRN: [id number] Date of Birth: [birth year]

## 2016-08-01 ENCOUNTER — Other Ambulatory Visit: Payer: BLUE CROSS/BLUE SHIELD

## 2016-08-02 ENCOUNTER — Other Ambulatory Visit (INDEPENDENT_AMBULATORY_CARE_PROVIDER_SITE_OTHER): Payer: BLUE CROSS/BLUE SHIELD

## 2016-08-02 LAB — HEPATIC FUNCTION PANEL
ALT: 14 U/L (ref 0–35)
AST: 16 U/L (ref 0–37)
Albumin: 4.3 g/dL (ref 3.5–5.2)
Alkaline Phosphatase: 58 U/L (ref 39–117)
Bilirubin, Direct: 0.1 mg/dL (ref 0.0–0.3)
Total Bilirubin: 0.7 mg/dL (ref 0.2–1.2)
Total Protein: 6.7 g/dL (ref 6.0–8.3)

## 2016-08-28 ENCOUNTER — Encounter: Payer: Self-pay | Admitting: Emergency Medicine

## 2016-08-28 ENCOUNTER — Ambulatory Visit (INDEPENDENT_AMBULATORY_CARE_PROVIDER_SITE_OTHER): Payer: BLUE CROSS/BLUE SHIELD

## 2016-08-28 ENCOUNTER — Ambulatory Visit
Admission: EM | Admit: 2016-08-28 | Discharge: 2016-08-28 | Disposition: A | Discharge status: Home / Self Care | Payer: BLUE CROSS/BLUE SHIELD | Attending: Family Medicine | Admitting: Family Medicine

## 2016-08-28 DIAGNOSIS — S20211A Contusion of right front wall of thorax, initial encounter: Secondary | ICD-10-CM

## 2016-08-28 IMAGING — CR DG RIBS W/ CHEST 3+V*R*
5 series · 5 of 5 positions shown · non-contrast
Comparison: [DATE]

CLINICAL DATA: Right side rib pain after fall this morning

EXAM:
RIGHT RIBS AND CHEST - 3+ VIEW

[chest pa]
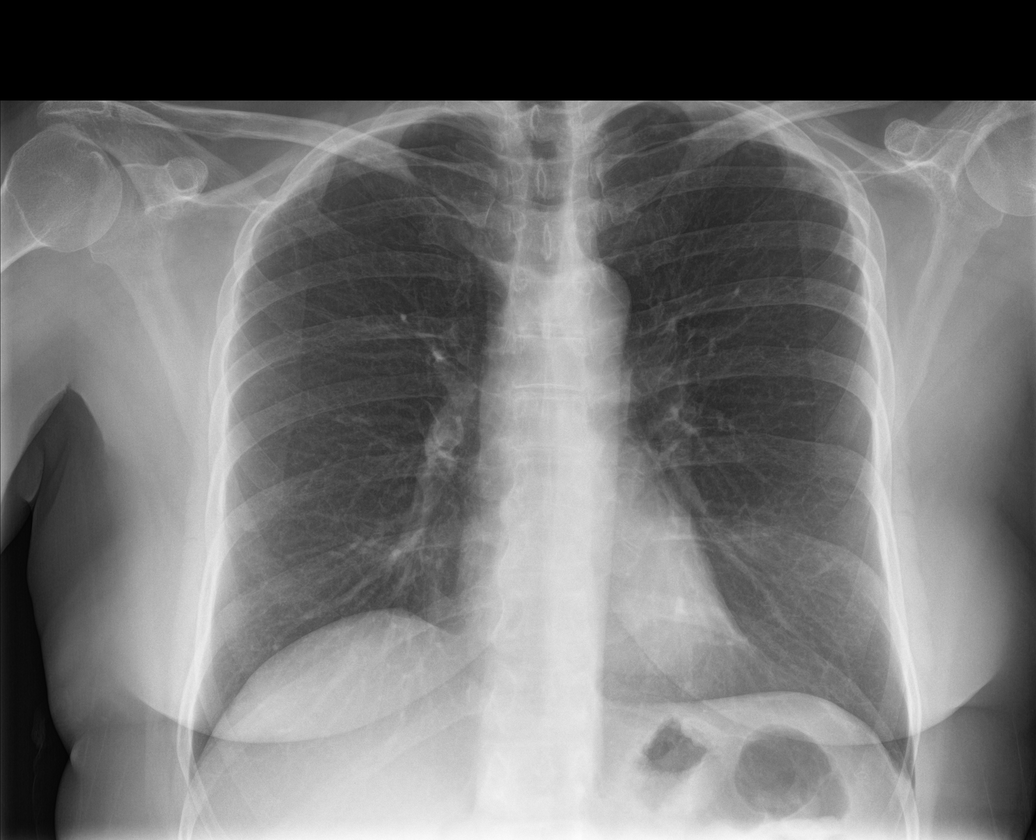

[rib pa (1 of 2)]
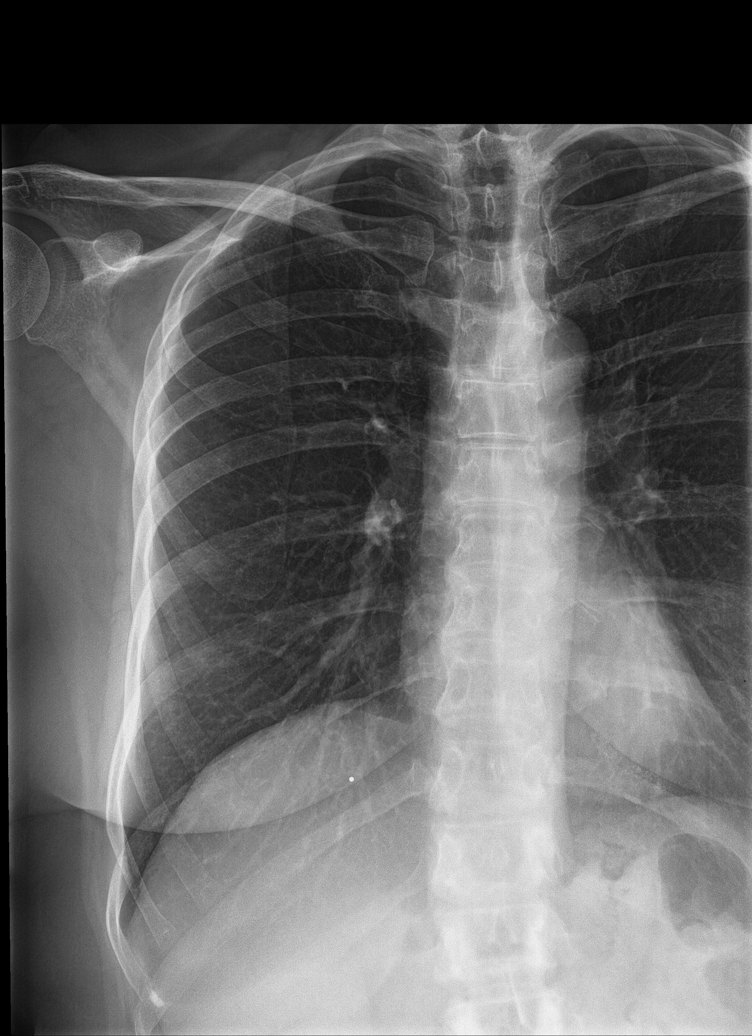

[rib pa (2 of 2)]
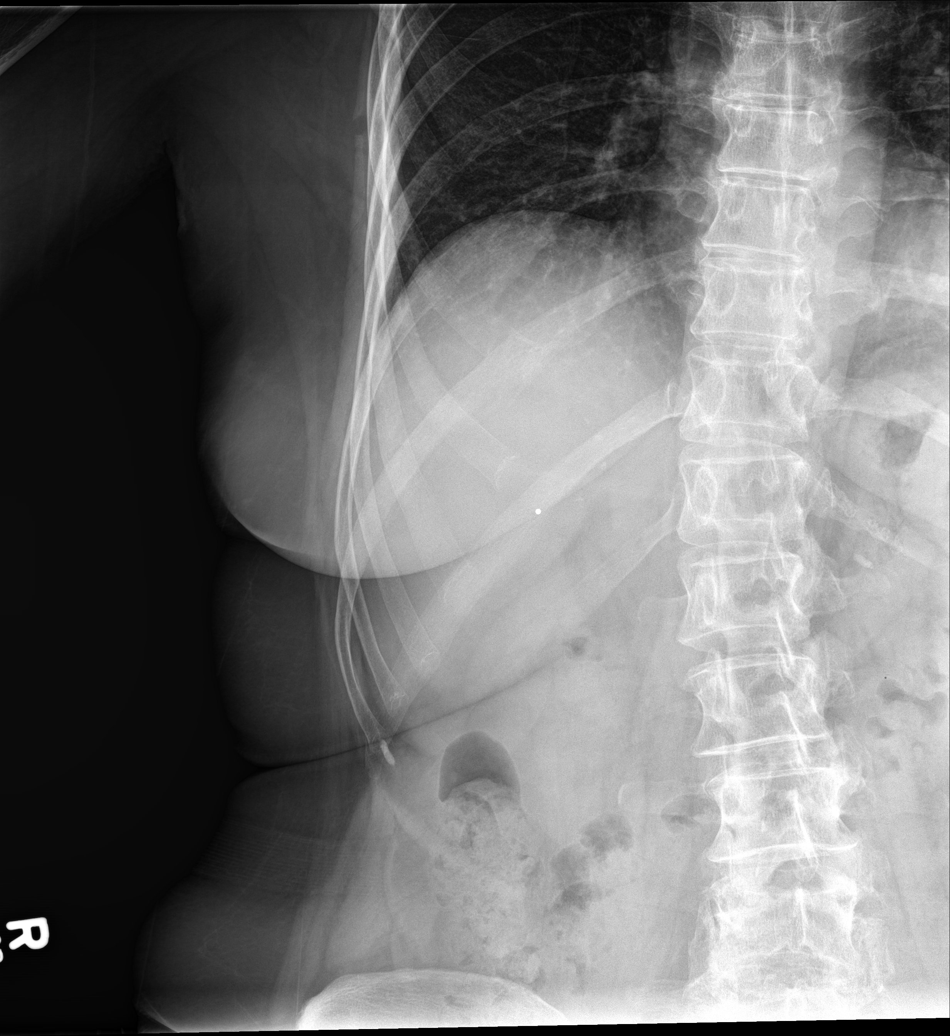

[rib obl (1 of 2)]
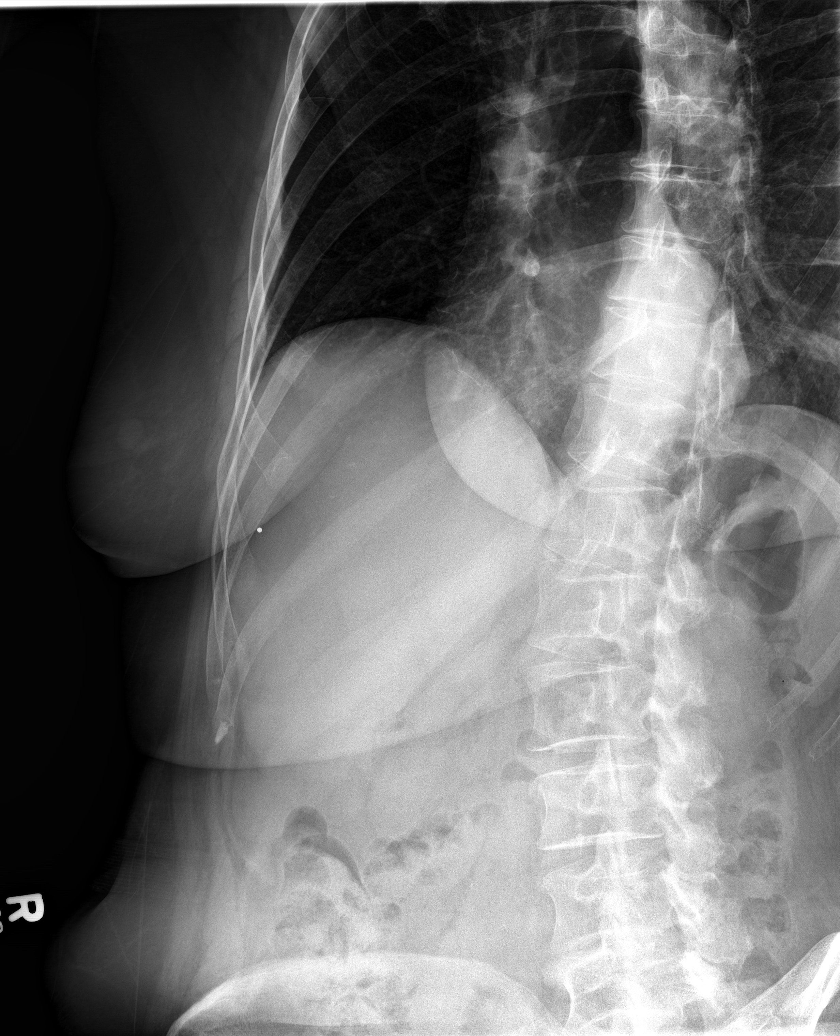

[rib obl (2 of 2)]
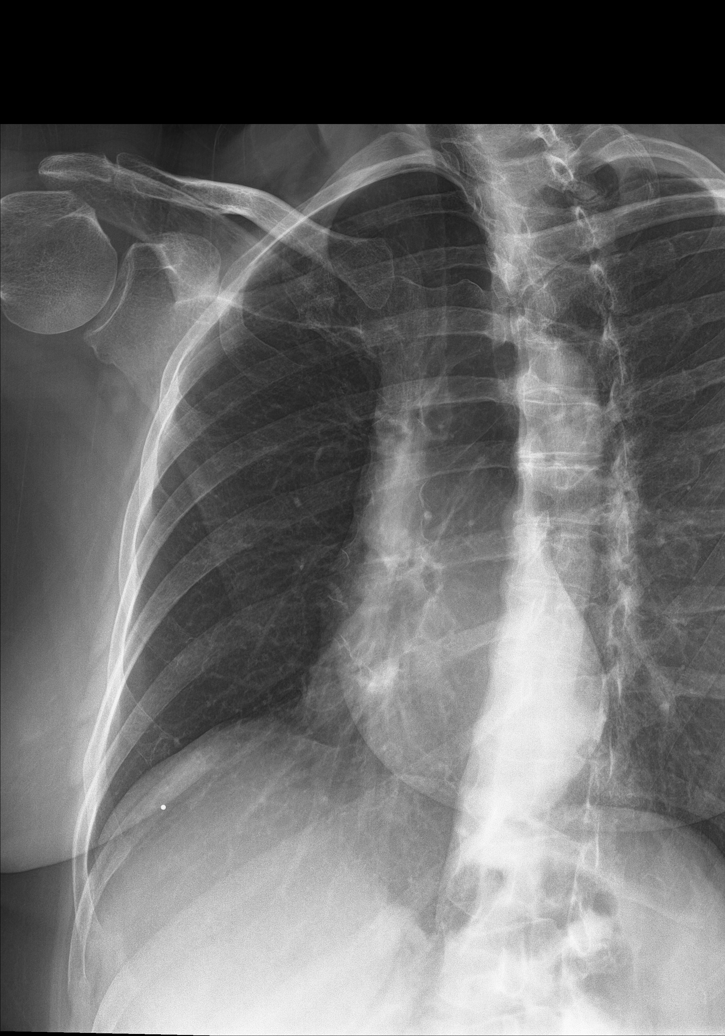

[5 of 5 positions shown; findings below may reference images not displayed]

FINDINGS: No fracture or other bone lesions are seen involving the ribs. There
is no evidence of pneumothorax or pleural effusion. Both lungs are
clear. Heart size and mediastinal contours are within normal limits.
IMPRESSION: Negative.

## 2016-08-28 MED ORDER — MELOXICAM 15 MG PO TABS: 15.0000 mg | ORAL_TABLET | Freq: Every day | ORAL | 0 refills | Status: DC

## 2016-08-28 MED ORDER — HYDROCODONE-ACETAMINOPHEN 5-325 MG PO TABS: 1.0000 | ORAL_TABLET | Freq: Three times a day (TID) | ORAL | 0 refills | Status: DC | PRN

## 2016-08-28 NOTE — ED Triage Notes (Signed)
Patient states that she fell around 4:00am this morning.  Patient states she landed on her right side of her rib cage. Patient c/o right sided rib pain.

## 2016-08-28 NOTE — ED Provider Notes (Signed)
[facility]    CSN: [id number] Arrival date & time: 08/28/16  [id number]     History   Chief Complaint Chief Complaint  Patient presents with  . Fall  . Rib Pain    right side    HPI Lynn Mann is a 66 y.o. female.   She is here because of injury to her right ribs. She reports about 3:00 this morning going [employer] following eating her right lower ribs on the shredder. She states she is a 10 pain now the pain in the right lower ribs is present. It hurts worse when she coughs. Patient is a 66 year old white female history of arthritis she's had her gallbladder removed in the past she's had a history of IBS and mitral valve prolapse. She is allergic to latex and yellow dye. She does not smoke.   The history is provided by the patient. No language interpreter was used.  Fall  This is a new problem. The problem has not changed since onset.Associated symptoms include chest pain. The symptoms are aggravated by coughing. Nothing relieves the symptoms.    Past Medical History:  Diagnosis Date  . Arthritis   . Chicken pox   . Cholecystitis 11/2011   Did not require sgy - Dr. Staci Acosta - [facility]  (cholelithiasis)  . H/O Clostridium difficile infection   . IBS (irritable bowel syndrome)   . MRSA exposure 2005   Spider bite  . MVP (mitral valve prolapse)    Stable - Dr. Ubaldo Glassing  . Rheumatic fever     Patient Active Problem List   Diagnosis Date Noted  . Asthma 07/16/2016  . Urinary incontinence 07/16/2016  . Dizziness 04/01/2016  . SOB (shortness of breath) 04/01/2016  . Bilateral shoulder pain 02/24/2016  . Fatigue 01/28/2016  . Neck fullness 01/28/2016  . Routine general medical examination at a health care facility 07/25/2015  . Health care maintenance 10/09/2014  . Foot pain 06/19/2014  . Diarrhea 04/08/2014  . Hyperglycemia 02/07/2014  . Neck pain 11/26/2013  . Hypercholesterolemia 11/26/2013  . History of colonic polyps 05/11/2013  .  Hyperbilirubinemia 01/18/2013  . GERD (gastroesophageal reflux disease) 12/03/2012  . Pneumonia 11/27/2012  . Cholelithiasis 11/07/2012  . IBS (irritable bowel syndrome) 11/07/2012  . History of rheumatic fever 08/28/2012  . MVP (mitral valve prolapse) 08/28/2012    Past Surgical History:  Procedure Laterality Date  . CATARACT EXTRACTION  [id number]  . MUSCLE BIOPSY      OB History    No data available       Home Medications    Prior to Admission medications   Medication Sig Start Date End Date Taking? Authorizing Provider  albuterol (PROVENTIL HFA;VENTOLIN HFA) 108 (90 Base) MCG/ACT inhaler Inhale 2 puffs into the lungs every 6 (six) hours as needed for wheezing or shortness of breath. 02/23/16 02/29/16  Einar Pheasant, MD  fluticasone (FLONASE) 50 MCG/ACT nasal spray Place 1 spray into both nostrils 2 (two) times daily. 12/16/15 01/23/16  Aura Fey Betancourt, NP  fluticasone furoate-vilanterol (BREO ELLIPTA) 100-25 MCG/INH AEPB Inhale 1 puff into the lungs daily. 04/09/16   Wilhelmina Mcardle, MD  HYDROcodone-acetaminophen (NORCO) 5-325 MG tablet Take 1 tablet by mouth every 8 (eight) hours as needed for moderate pain. 08/28/16   Frederich Cha, MD  meloxicam (MOBIC) 15 MG tablet Take 1 tablet (15 mg total) by mouth daily. 08/28/16   Frederich Cha, MD  sodium chloride (OCEAN) 0.65 % SOLN nasal spray Place 2 sprays into  both nostrils every 2 (two) hours while awake. 12/16/15   Olen Cordial, NP    Family History Family History  Problem Relation Age of Onset  . Arthritis Mother   . Stroke Mother   . Diabetes Mother   . Hypertension Mother   . Arthritis Father   . Stroke Father   . Diabetes Paternal Grandmother   . Cancer Paternal Uncle     colon  . Breast cancer Maternal Aunt   . Heart disease      maternal and paternal side  . Breast cancer Paternal Aunt   . Breast cancer Cousin   . Breast cancer Cousin     female cousin    Social History Social History  Substance Use Topics  .  Smoking status: Never Smoker  . Smokeless tobacco: Never Used  . Alcohol use 0.0 oz/week     Comment: Rarely     Allergies   Adhesive [tape]; Tartrazine; and Yellow dyes (non-tartrazine)   Review of Systems Review of Systems  Respiratory: Positive for cough.   Cardiovascular: Positive for chest pain.  All other systems reviewed and are negative.    Physical Exam Triage Vital Signs ED Triage Vitals  Enc Vitals Group     BP 08/28/16 0825 (!) 118/7     Pulse Rate 08/28/16 0825 92     Resp 08/28/16 0825 16     Temp 08/28/16 0825 97.9 F (36.6 C)     Temp Source 08/28/16 0825 Oral     SpO2 08/28/16 0825 98 %     Weight 08/28/16 0824 190 lb (86.2 kg)     Height 08/28/16 0824 5\' 7"  (1.702 m)     Head Circumference --      Peak Flow --      Pain Score 08/28/16 0826 9     Pain Loc --      Pain Edu? --      Excl. in [place]? --    No data found.   Updated Vital Signs BP (!) 118/7 (BP Location: Left Arm)   Pulse 92   Temp 97.9 F (36.6 C) (Oral)   Resp 16   Ht 5\' 7"  (1.702 m)   Wt 190 lb (86.2 kg)   SpO2 98%   BMI 29.76 kg/m   Visual Acuity Right Eye Distance:   Left Eye Distance:   Bilateral Distance:    Right Eye Near:   Left Eye Near:    Bilateral Near:     Physical Exam  Constitutional: She appears well-developed and well-nourished.  HENT:  Head: Normocephalic and atraumatic.  Right Ear: External ear normal.  Left Ear: External ear normal.  Eyes: Pupils are equal, round, and reactive to light.  Neck: Normal range of motion.  Cardiovascular: Normal rate and regular rhythm.   Pulmonary/Chest: Effort normal and breath sounds normal. She exhibits tenderness and bony tenderness.    Short marked tenderness over the chest wall on the lower right axillary area  Musculoskeletal: Normal range of motion.  Neurological: She is alert.  Skin: Skin is warm.  Vitals reviewed.    UC Treatments / Results  Labs (all labs ordered are listed, but only abnormal  results are displayed) Labs Reviewed - No data to display  EKG  EKG Interpretation None       Radiology Dg Ribs Unilateral W/chest Right  Result Date: 08/28/2016 CLINICAL DATA:  Right side rib pain after fall this morning EXAM: RIGHT RIBS AND CHEST - 3+ VIEW  COMPARISON:  12/16/2015 FINDINGS: No fracture or other bone lesions are seen involving the ribs. There is no evidence of pneumothorax or pleural effusion. Both lungs are clear. Heart size and mediastinal contours are within normal limits. IMPRESSION: Negative. Electronically Signed   By: Rolm Baptise M.D.   On: 08/28/2016 08:52    Procedures Procedures (including critical care time)  Medications Ordered in UC Medications - No data to display   Initial Impression / Assessment and Plan / UC Course  I have reviewed the triage vital signs and the nursing notes.  Pertinent labs & imaging results that were available during my care of the patient were reviewed by me and considered in my medical decision making (see chart for details).    Offered Toradol which patient declined we'll place her on Mobic 15 mg 1 tablet day will give her 15 Vicodin tablets should be noted the patient was smoked up at the [facility] drug reporting site and only narcotics seen in the last year and a half was a prescription for Tussionex. I did recommend follow-up with her PCP in about 3 weeks for repeat x-rays if right ribs still tender for probable repeat x-ray at that time  Final Clinical Impressions(s) / UC Diagnoses   Final diagnoses:  Rib contusion, right, initial encounter    New Prescriptions Discharge Medication List as of 08/28/2016  9:18 AM    START taking these medications   Details  HYDROcodone-acetaminophen (NORCO) 5-325 MG tablet Take 1 tablet by mouth every 8 (eight) hours as needed for moderate pain., Starting Wed 08/28/2016, Normal    meloxicam (MOBIC) 15 MG tablet Take 1 tablet (15 mg total) by mouth daily., Starting Wed  08/28/2016, Normal         Note: This dictation was prepared with Dragon dictation along with smaller phrase technology. Any transcriptional errors that result from this process are unintentional.   Frederich Cha, MD 08/28/16 1011

## 2016-10-07 ENCOUNTER — Ambulatory Visit (INDEPENDENT_AMBULATORY_CARE_PROVIDER_SITE_OTHER): Payer: BLUE CROSS/BLUE SHIELD | Admitting: Family

## 2016-10-07 ENCOUNTER — Encounter: Payer: Self-pay | Admitting: Family

## 2016-10-07 VITALS — BP 126/70 | HR 83 | Temp 98.4°F | Ht 67.0 in | Wt 195.0 lb

## 2016-10-07 DIAGNOSIS — J40 Bronchitis, not specified as acute or chronic: Secondary | ICD-10-CM | POA: Diagnosis not present

## 2016-10-07 MED ORDER — ALBUTEROL SULFATE HFA 108 (90 BASE) MCG/ACT IN AERS: 2.0000 | INHALATION_SPRAY | Freq: Four times a day (QID) | RESPIRATORY_TRACT | 1 refills | Status: DC | PRN

## 2016-10-07 NOTE — Progress Notes (Signed)
Pre visit review using our clinic review tool, if applicable. No additional management support is needed unless otherwise documented below in the visit note. 

## 2016-10-07 NOTE — Progress Notes (Signed)
Subjective:    Patient ID: Lynn Mann, female    DOB: [birth year], 66 y.o.   MRN: [id number]  CC: Lynn Mann is a 66 y.o. female who presents today for an acute visit.    HPI: CC: cough x 5 days, slightly better. Notes eyes puffy and thought seasonal allergies. Endorses low grade fever, tmax 99 F, Thick congestion, wheezing, and 'some' SOB. Just started breo back and cannot yet tell if working. Also describes ears itching, dry. No discharge from ears. Wears hearing aids.       HISTORY:  Past Medical History:  Diagnosis Date  . Arthritis   . Chicken pox   . Cholecystitis 11/2011   Did not require sgy - Dr. Staci Acosta - [facility]  (cholelithiasis)  . H/O Clostridium difficile infection   . IBS (irritable bowel syndrome)   . MRSA exposure 2005   Spider bite  . MVP (mitral valve prolapse)    Stable - Dr. Ubaldo Glassing  . Rheumatic fever    Past Surgical History:  Procedure Laterality Date  . CATARACT EXTRACTION  [id number]  . MUSCLE BIOPSY     Family History  Problem Relation Age of Onset  . Arthritis Mother   . Stroke Mother   . Diabetes Mother   . Hypertension Mother   . Arthritis Father   . Stroke Father   . Diabetes Paternal Grandmother   . Cancer Paternal Uncle     colon  . Breast cancer Maternal Aunt   . Heart disease      maternal and paternal side  . Breast cancer Paternal Aunt   . Breast cancer Cousin   . Breast cancer Cousin     female cousin    Allergies: Adhesive [tape]; Tartrazine; and Yellow dyes (non-tartrazine) Current Outpatient Prescriptions on File Prior to Visit  Medication Sig Dispense Refill  . fluticasone furoate-vilanterol (BREO ELLIPTA) 100-25 MCG/INH AEPB Inhale 1 puff into the lungs daily. 60 each 5  . HYDROcodone-acetaminophen (NORCO) 5-325 MG tablet Take 1 tablet by mouth every 8 (eight) hours as needed for moderate pain. 15 tablet 0  . meloxicam (MOBIC) 15 MG tablet Take 1 tablet (15 mg total) by mouth daily. 30 tablet 0  . sodium chloride  (OCEAN) 0.65 % SOLN nasal spray Place 2 sprays into both nostrils every 2 (two) hours while awake.  0  . fluticasone (FLONASE) 50 MCG/ACT nasal spray Place 1 spray into both nostrils 2 (two) times daily. 16 g 5  . [DISCONTINUED] NIACIN, ANTIHYPERLIPIDEMIC, PO Take by mouth 3 (three) times daily.     No current facility-administered medications on file prior to visit.     Social History  Substance Use Topics  . Smoking status: Never Smoker  . Smokeless tobacco: Never Used  . Alcohol use 0.0 oz/week     Comment: Rarely    Review of Systems  Constitutional: Negative for chills and fever.  HENT: Positive for congestion, sore throat and trouble swallowing. Negative for ear discharge, ear pain and sinus pressure.   Respiratory: Positive for cough, shortness of breath and wheezing.   Cardiovascular: Negative for chest pain and palpitations.  Gastrointestinal: Negative for nausea and vomiting.      Objective:    BP 126/70   Pulse 83   Temp 98.4 F (36.9 C) (Oral)   Ht 5\' 7"  (1.702 m)   Wt 195 lb (88.5 kg)   SpO2 96%   BMI 30.54 kg/m    Physical Exam  Constitutional: She  appears well-developed and well-nourished.  HENT:  Head: Normocephalic and atraumatic.  Right Ear: Hearing, tympanic membrane, external ear and ear canal normal. No drainage, swelling or tenderness. No foreign bodies. Tympanic membrane is not erythematous and not bulging. No middle ear effusion. No decreased hearing is noted.  Left Ear: Hearing, tympanic membrane, external ear and ear canal normal. No drainage, swelling or tenderness. No foreign bodies. Tympanic membrane is not erythematous and not bulging.  No middle ear effusion. No decreased hearing is noted.  Nose: Nose normal. No rhinorrhea. Right sinus exhibits no maxillary sinus tenderness and no frontal sinus tenderness. Left sinus exhibits no maxillary sinus tenderness and no frontal sinus tenderness.  Mouth/Throat: Uvula is midline, oropharynx is clear and  moist and mucous membranes are normal. No oropharyngeal exudate, posterior oropharyngeal edema, posterior oropharyngeal erythema or tonsillar abscesses.  Dry excoriated external ear canals bilaterally  Eyes: Conjunctivae are normal.  Cardiovascular: Regular rhythm, normal heart sounds and normal pulses.   Pulmonary/Chest: Effort normal and breath sounds normal. She has no wheezes. She has no rhonchi. She has no rales.  Lymphadenopathy:       Head (right side): No submental, no submandibular, no tonsillar, no preauricular, no posterior auricular and no occipital adenopathy present.       Head (left side): No submental, no submandibular, no tonsillar, no preauricular, no posterior auricular and no occipital adenopathy present.    She has no cervical adenopathy.  Neurological: She is alert.  Skin: Skin is warm and dry.  Psychiatric: She has a normal mood and affect. Her speech is normal and behavior is normal. Thought content normal.  Vitals reviewed. Patient felt significantly better after albuterol treatment. Lung sounds clear and increased      Assessment & Plan:  1. Bronchitis sa02 96. No acute respiratory distress. Reassured as patient clinically improving. We jointly decided that she would continue conservative therapy at home. Return precautions given.      - albuterol (PROVENTIL HFA;VENTOLIN HFA) 108 (90 Base) MCG/ACT inhaler; Inhale 2 puffs into the lungs every 6 (six) hours as needed for wheezing or shortness of breath.  Dispense: 1 Inhaler; Refill: 1     I am having Lynn Mann maintain her fluticasone, sodium chloride, fluticasone furoate-vilanterol, meloxicam, HYDROcodone-acetaminophen, and albuterol.   Meds ordered this encounter  Medications  . albuterol (PROVENTIL HFA;VENTOLIN HFA) 108 (90 Base) MCG/ACT inhaler    Sig: Inhale 2 puffs into the lungs every 6 (six) hours as needed for wheezing or shortness of breath.    Dispense:  1 Inhaler    Refill:  1    Order Specific  Question:   Supervising Provider    Answer:   Crecencio Mc [[id number]]    Return precautions given.   Risks, benefits, and alternatives of the medications and treatment plan prescribed today were discussed, and patient expressed understanding.   Education regarding symptom management and diagnosis given to patient on AVS.  Continue to follow with Einar Pheasant, MD for routine health maintenance.   [name] and I agreed with plan.   Mable Paris, FNP

## 2016-10-07 NOTE — Patient Instructions (Addendum)
cortizone 10 - VERY small amount on a q tip for ear itching and dryness.   Plain mucinex- lots of water  Honey   Use albuterol every 6 hours for first 24 hours to get good medication into the lungs and loosen congestion; after, you may use as needed and eventually stop all together when cough resolves.  If there is no improvement in your symptoms, or if there is any worsening of symptoms, or if you have any additional concerns, please return for re-evaluation; or, if we are closed, consider going to the Emergency Room for evaluation if symptoms urgent.

## 2016-10-30 NOTE — Telephone Encounter (Signed)
error 

## 2016-12-08 ENCOUNTER — Ambulatory Visit
Admission: EM | Admit: 2016-12-08 | Discharge: 2016-12-08 | Disposition: A | Discharge status: Home / Self Care | Payer: BLUE CROSS/BLUE SHIELD | Attending: Emergency Medicine | Admitting: Emergency Medicine

## 2016-12-08 DIAGNOSIS — H73012 Bullous myringitis, left ear: Secondary | ICD-10-CM | POA: Diagnosis not present

## 2016-12-08 DIAGNOSIS — J45901 Unspecified asthma with (acute) exacerbation: Secondary | ICD-10-CM

## 2016-12-08 DIAGNOSIS — H6591 Unspecified nonsuppurative otitis media, right ear: Secondary | ICD-10-CM

## 2016-12-08 DIAGNOSIS — R059 Cough, unspecified: Secondary | ICD-10-CM

## 2016-12-08 DIAGNOSIS — R05 Cough: Secondary | ICD-10-CM | POA: Diagnosis not present

## 2016-12-08 DIAGNOSIS — J40 Bronchitis, not specified as acute or chronic: Secondary | ICD-10-CM

## 2016-12-08 LAB — RAPID STREP SCREEN (MED CTR MEBANE ONLY): Streptococcus, Group A Screen (Direct): NEGATIVE

## 2016-12-08 MED ORDER — ALBUTEROL SULFATE HFA 108 (90 BASE) MCG/ACT IN AERS: 2.0000 | INHALATION_SPRAY | RESPIRATORY_TRACT | 0 refills | Status: DC | PRN

## 2016-12-08 MED ORDER — AEROCHAMBER PLUS MISC: 2 refills | Status: DC

## 2016-12-08 MED ORDER — PREDNISONE 20 MG PO TABS: 40.0000 mg | ORAL_TABLET | Freq: Every day | ORAL | 0 refills | Status: AC

## 2016-12-08 MED ORDER — AZITHROMYCIN 250 MG PO TABS: 250.0000 mg | ORAL_TABLET | Freq: Every day | ORAL | 0 refills | Status: DC

## 2016-12-08 MED ORDER — BENZONATATE 200 MG PO CAPS: 200.0000 mg | ORAL_CAPSULE | Freq: Three times a day (TID) | ORAL | 0 refills | Status: DC | PRN

## 2016-12-08 MED ORDER — HYDROCOD POLST-CPM POLST ER 10-8 MG/5ML PO SUER: 5.0000 mL | Freq: Two times a day (BID) | ORAL | 0 refills | Status: DC | PRN

## 2016-12-08 NOTE — ED Triage Notes (Signed)
Patient complains of cough, congestion, nausea, shortness of breath x 10 days. Patient states that she was improving on Friday and symptoms worsened again yesterday. Patient states that she has been using her inhalers and flonase without relief. Patient complains of bilateral ear pain.

## 2016-12-08 NOTE — ED Provider Notes (Signed)
HPI  SUBJECTIVE:  Lynn Mann is a 66 y.o. female who presents with 11 days of sore throat, cough, fatigue, postnasal drip. Patient reports 2-3 episodes of posttussive emesis per day, but states that she is tolerating by mouth. No paroxysmal coughing spells. She reports decreased appetite. She reports wheezing, bodyaches and a frontal headache which is worse with bending forward. Says that she is coughing up thick white phlegm. Patient states that she got better and then got acutely worse yesterday. Has tried Flonase, Mucinex, and is using her albuterol inhaler 3 times a day. Normally uses it as needed. States that the albuterol helps. There are no aggravating factors. No fevers. She denies chest pressure, tightness, pain. States that she is waking up at night intermittently short of breath. Otherwise sleeping okay. No nasal congestion, rhinorrhea. No fevers, no sinus pain or pressure. No rash, tick bite. States her husband has identical symptoms. No allergy type symptoms. States that her GERD has been bothering her. No antibiotics in the past month. No antipyretic in the past 6-8 hours.  also reports bilateral ear pain on the left more so than the right states that it is like "pins sticking in my ears" starting yesterday. Symptoms are worse when she presses or lies on her left ear, no alleviating factors. Patient states that she is unable to wear her hearing aids because of the pain. She denies change in hearing, otorrhea. States that her ears feel full and as if there is something in there.  ear pain is not associated with chewing, yawning. She has not been swimming recently.  Past medical history of asthma, TMJ, rheumatic fever. She is allergic to mold. No history of GERD, diabetes, hypertension. PMD: Dr. Einar Pheasant.  Past Medical History:  Diagnosis Date  . Arthritis   . Chicken pox   . Cholecystitis 11/2011   Did not require sgy - Dr. Staci Acosta - [facility]  (cholelithiasis)  . H/O  Clostridium difficile infection   . IBS (irritable bowel syndrome)   . MRSA exposure 2005   Spider bite  . MVP (mitral valve prolapse)    Stable - Dr. Ubaldo Glassing  . Rheumatic fever     Past Surgical History:  Procedure Laterality Date  . CATARACT EXTRACTION  [id number]  . MUSCLE BIOPSY      Family History  Problem Relation Age of Onset  . Arthritis Mother   . Stroke Mother   . Diabetes Mother   . Hypertension Mother   . Arthritis Father   . Stroke Father   . Diabetes Paternal Grandmother   . Cancer Paternal Uncle        colon  . Breast cancer Maternal Aunt   . Heart disease Unknown        maternal and paternal side  . Breast cancer Paternal Aunt   . Breast cancer Cousin   . Breast cancer Cousin        female cousin    Social History  Substance Use Topics  . Smoking status: Never Smoker  . Smokeless tobacco: Never Used  . Alcohol use 0.0 oz/week     Comment: Rarely    No current facility-administered medications for this encounter.   Current Outpatient Prescriptions:  .  fluticasone (FLONASE) 50 MCG/ACT nasal spray, Place 1 spray into both nostrils 2 (two) times daily., Disp: 16 g, Rfl: 5 .  fluticasone furoate-vilanterol (BREO ELLIPTA) 100-25 MCG/INH AEPB, Inhale 1 puff into the lungs daily., Disp: 60 each, Rfl: 5 .  sodium chloride (OCEAN) 0.65 % SOLN nasal spray, Place 2 sprays into both nostrils every 2 (two) hours while awake., Disp: , Rfl: 0 .  albuterol (PROVENTIL HFA;VENTOLIN HFA) 108 (90 Base) MCG/ACT inhaler, Inhale 2 puffs into the lungs every 4 (four) hours as needed for wheezing or shortness of breath., Disp: 1 Inhaler, Rfl: 0 .  azithromycin (ZITHROMAX) 250 MG tablet, Take 1 tablet (250 mg total) by mouth daily. 2 tabs po on day 1, 1 tab po on days 2-5, Disp: 6 tablet, Rfl: 0 .  benzonatate (TESSALON) 200 MG capsule, Take 1 capsule (200 mg total) by mouth 3 (three) times daily as needed for cough., Disp: 30 capsule, Rfl: 0 .  chlorpheniramine-HYDROcodone  (TUSSIONEX PENNKINETIC ER) 10-8 MG/5ML SUER, Take 5 mLs by mouth every 12 (twelve) hours as needed for cough., Disp: 120 mL, Rfl: 0 .  predniSONE (DELTASONE) 20 MG tablet, Take 2 tablets (40 mg total) by mouth daily with breakfast., Disp: 10 tablet, Rfl: 0 .  Spacer/Aero-Holding Chambers (AEROCHAMBER PLUS) inhaler, Use as instructed, Disp: 1 each, Rfl: 2  Allergies  Allergen Reactions  . Adhesive [Tape]   . Tartrazine   . Yellow Dyes (Non-Tartrazine) Other (See Comments)    Arthritis pains - it is tartrazine     ROS  As noted in HPI.   Physical Exam  BP 111/63 (BP Location: Left Arm)   Pulse (!) 110   Temp 98.6 F (37 C) (Oral)   Resp 18   Ht 5\' 7"  (1.702 m)   Wt 188 lb (85.3 kg)   SpO2 97%   BMI 29.44 kg/m   Constitutional: Well developed, well nourished, no acute distress Eyes: PERRL, EOMI, conjunctiva normal bilaterally HENT: Normocephalic, atraumatic,mucus membranes moistBilateral mastoid nontender. Bilateral external ears and external ear canals within normal limits. No pain with traction on pinna or palpation of tragus. Positive bilateral TM injection. TMs intact. Positive air fluid level right side. Questionable bullae on the left TM. Mild nasal congestion, no sinus tenderness. Erythematous oropharynx with cobblestoning. No postnasal drip. Neck: No cervical lymphadenopathy Respiratory: Clear to auscultation bilaterally, no rales, no wheezing, no rhonchi no chest wall tenderness  Cardiovascular: Regular tachycardia, no murmurs, no gallops, no rubs GI: Nondistended skin: No rash, skin intact Musculoskeletal:  no deformities Neurologic: Alert & oriented x 3, CN II-XII grossly intact, no motor deficits, sensation grossly intact Psychiatric: Speech and behavior appropriate   ED Course   Medications - No data to display  Orders Placed This Encounter  Procedures  . Rapid strep screen    Standing Status:   Standing    Number of Occurrences:   1    Order Specific  Question:   Patient immune status    Answer:   Normal  . Culture, group A strep    Standing Status:   Standing    Number of Occurrences:   1   Results for orders placed or performed during the hospital encounter of 12/08/16 (from the past 24 hour(s))  Rapid strep screen     Status: None   Collection Time: 12/08/16  2:10 PM  Result Value Ref Range   Streptococcus, Group A Screen (Direct) NEGATIVE NEGATIVE   No results found.  ED Clinical Impression  Right otitis media with effusion  Bullous myringitis of left ear  Cough  Mild asthma with exacerbation, unspecified whether persistent  Bronchitis - Plan: albuterol (PROVENTIL HFA;VENTOLIN HFA) 108 (90 Base) MCG/ACT inhaler   ED Assessment/Plan  Patient requesting strep test because  she has a history of rheumatic fever.  Rapid strep negative.  Presentation consistent with otitis media with effusion, a URI with asthma exacerbation.  bullous myringitis L side. Plan to send home with an albuterol inhaler with a spacer, Tussionex, Tessalon, Z-Pak,, prednisone 40 mg for 5 days.  Discussed labs,  MDM, plan and followup with patient. Discussed sn/sx that should prompt return to the ED. Patient agrees with plan.   Meds ordered this encounter  Medications  . albuterol (PROVENTIL HFA;VENTOLIN HFA) 108 (90 Base) MCG/ACT inhaler    Sig: Inhale 2 puffs into the lungs every 4 (four) hours as needed for wheezing or shortness of breath.    Dispense:  1 Inhaler    Refill:  0  . chlorpheniramine-HYDROcodone (TUSSIONEX PENNKINETIC ER) 10-8 MG/5ML SUER    Sig: Take 5 mLs by mouth every 12 (twelve) hours as needed for cough.    Dispense:  120 mL    Refill:  0  . benzonatate (TESSALON) 200 MG capsule    Sig: Take 1 capsule (200 mg total) by mouth 3 (three) times daily as needed for cough.    Dispense:  30 capsule    Refill:  0  . Spacer/Aero-Holding Chambers (AEROCHAMBER PLUS) inhaler    Sig: Use as instructed    Dispense:  1 each     Refill:  2  . predniSONE (DELTASONE) 20 MG tablet    Sig: Take 2 tablets (40 mg total) by mouth daily with breakfast.    Dispense:  10 tablet    Refill:  0  . azithromycin (ZITHROMAX) 250 MG tablet    Sig: Take 1 tablet (250 mg total) by mouth daily. 2 tabs po on day 1, 1 tab po on days 2-5    Dispense:  6 tablet    Refill:  0    *This clinic note was created using [employer]. Therefore, there may be occasional mistakes despite careful proofreading.  ?   Melynda Ripple, MD 12/08/16 1439

## 2016-12-08 NOTE — Discharge Instructions (Signed)
Your rapid strep was negative. Tessalon to help with cough during the day, Tussionex at night. If [name] is not working it is okay to take the Tussionex during the day. 2 puffs from your albuterol inhaler every 4 hours as needed for coughing and wheezing. Make sure you use the spacer. Finish the azithromycin, and try the prednisone. Continue Flonase and saline nasal spray.

## 2016-12-10 ENCOUNTER — Telehealth: Payer: Self-pay | Admitting: *Deleted

## 2016-12-10 NOTE — Telephone Encounter (Signed)
Since I am only in the office for 1/2 day tomorrow and gone for a few days - see if someone has an acute spot to see her for f/u of her ear.  Then I can f/u after.

## 2016-12-10 NOTE — Telephone Encounter (Signed)
Patient was seen at urgent care on 05/13 for a cough and ear pain. Patient has a upper raspatory infection. Patient has been advised to see her PCP for a follow up. Pt contact [phone] Pt has fluid behind right ear and blisters on her right ear drum. She also had some nausea from the cough. Please give a time and date .

## 2016-12-10 NOTE — Telephone Encounter (Addendum)
Please see note below.  Left patient message advising if symptoms get worse to follow up with urgent care or ED we will call back tomorrow.

## 2016-12-11 LAB — CULTURE, GROUP A STREP (THRC)

## 2016-12-11 NOTE — Telephone Encounter (Signed)
Appointment scheduled for follow up with [facility].

## 2016-12-12 ENCOUNTER — Ambulatory Visit (INDEPENDENT_AMBULATORY_CARE_PROVIDER_SITE_OTHER): Payer: BLUE CROSS/BLUE SHIELD

## 2016-12-12 ENCOUNTER — Encounter: Payer: Self-pay | Admitting: Family

## 2016-12-12 ENCOUNTER — Ambulatory Visit (INDEPENDENT_AMBULATORY_CARE_PROVIDER_SITE_OTHER): Payer: BLUE CROSS/BLUE SHIELD | Admitting: Family

## 2016-12-12 VITALS — BP 134/80 | HR 93 | Temp 98.1°F | Ht 67.0 in | Wt 193.2 lb

## 2016-12-12 DIAGNOSIS — J40 Bronchitis, not specified as acute or chronic: Secondary | ICD-10-CM | POA: Diagnosis not present

## 2016-12-12 IMAGING — DX DG CHEST 2V
2 series · 2 of 2 positions shown · non-contrast
Comparison: [DATE]

CLINICAL DATA: Cough for 1 week

EXAM:
CHEST  2 VIEW

[chest pa]
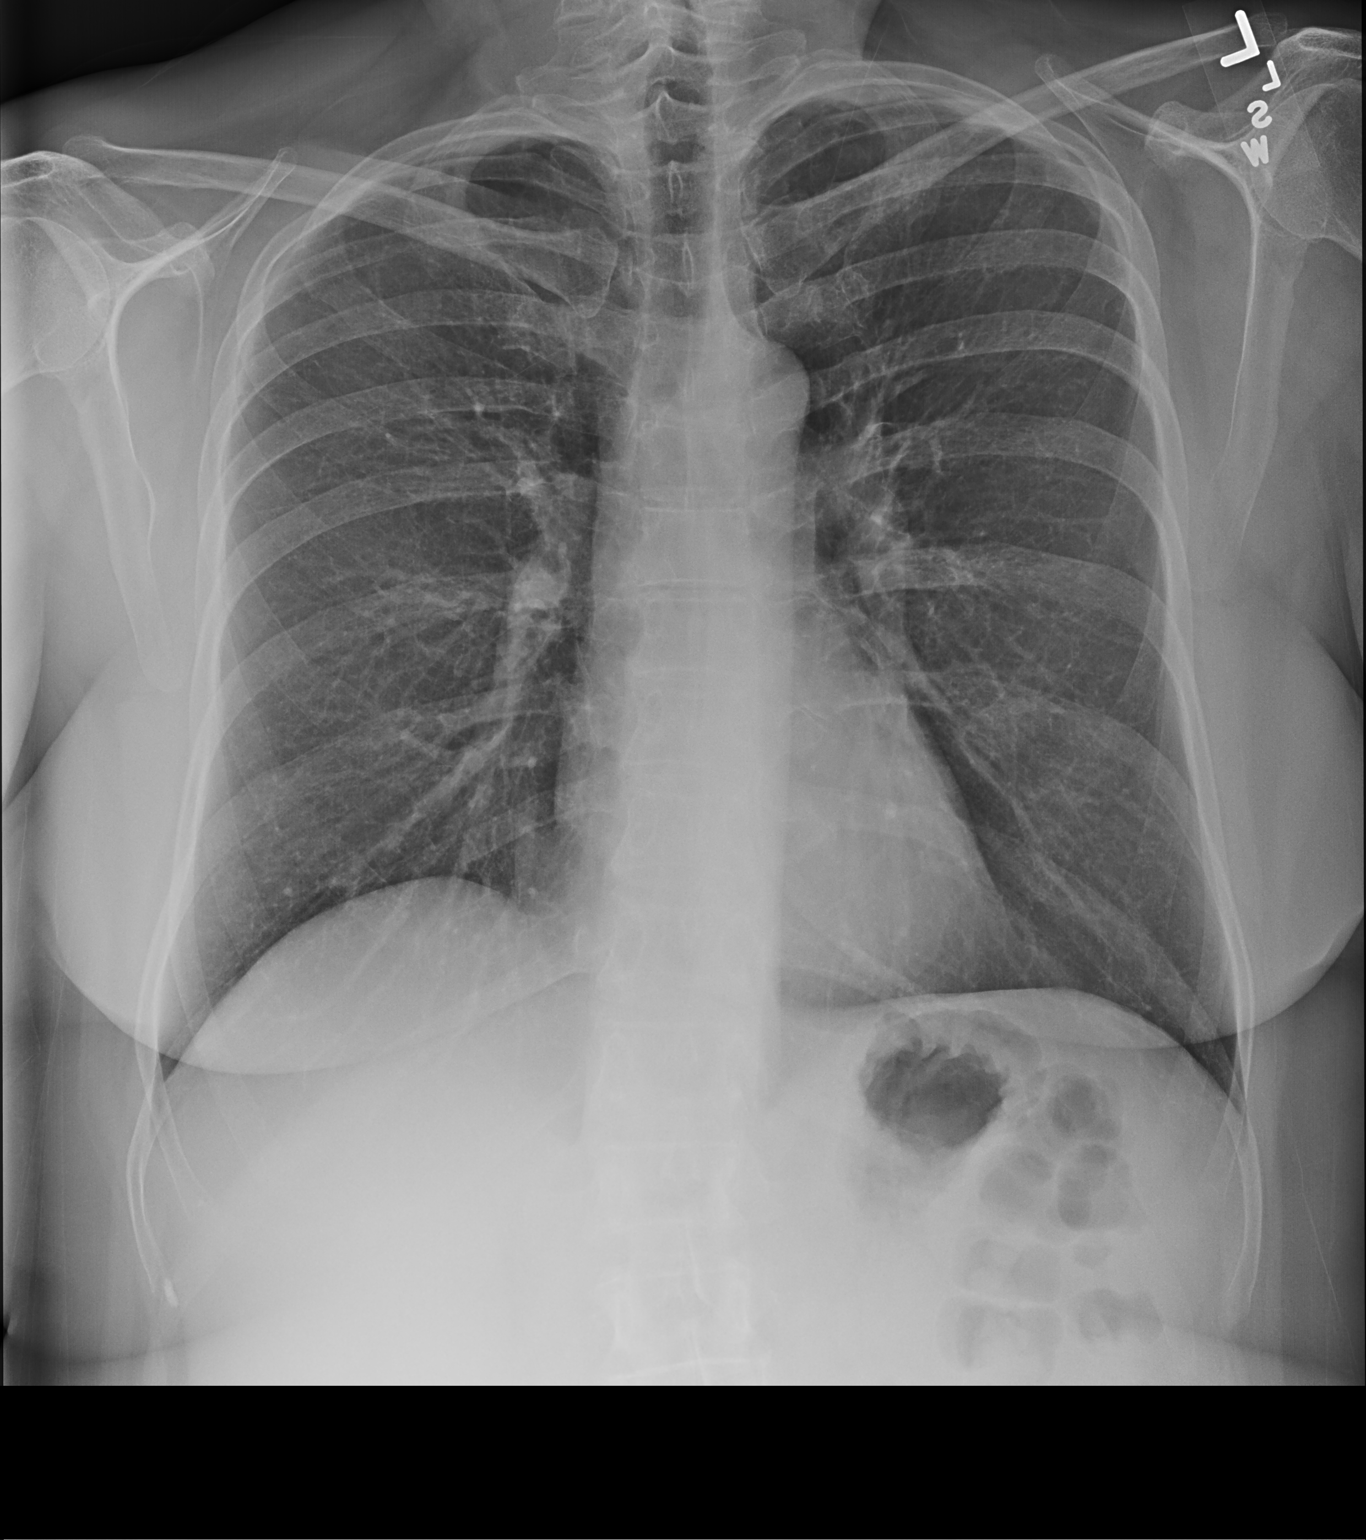

[chest lat]
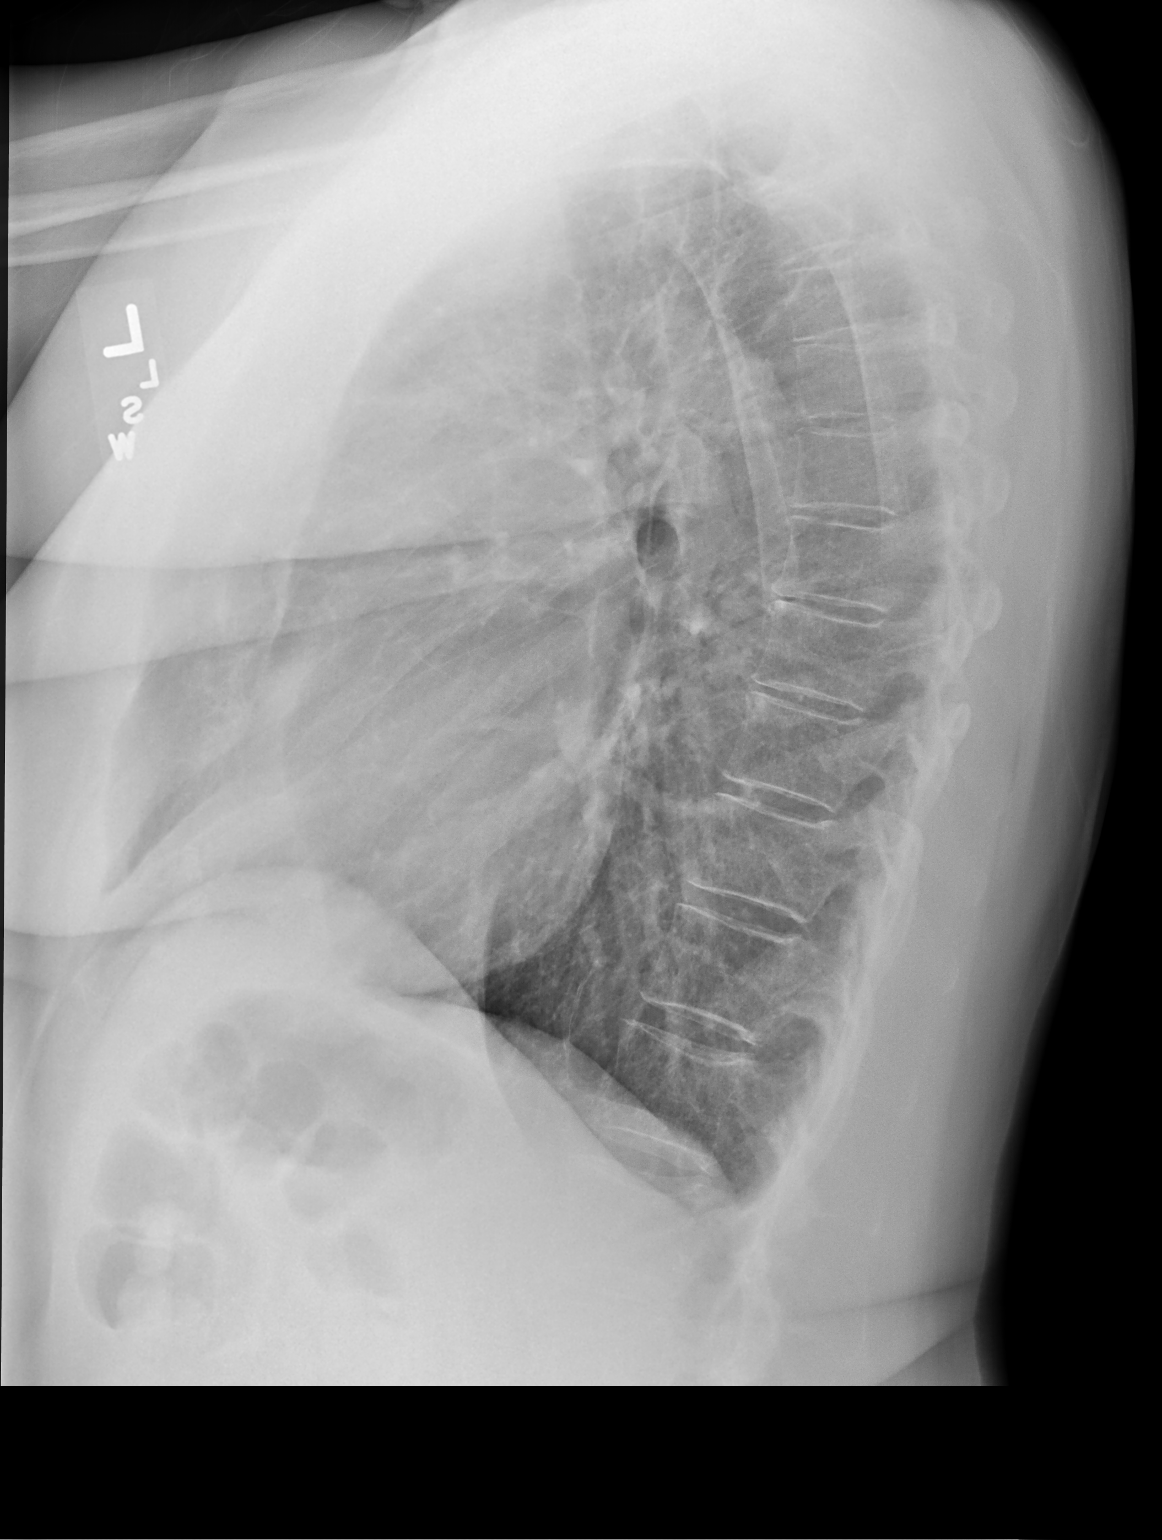

[2 of 2 positions shown; findings below may reference images not displayed]

FINDINGS: Lungs are clear. The heart size and pulmonary vascularity are
normal. No adenopathy. No bone lesions.
IMPRESSION: No edema or consolidation.

## 2016-12-12 NOTE — Progress Notes (Signed)
Subjective:    Patient ID: Lynn Mann, female    DOB: [birth year], 66 y.o.   MRN: [id number]  CC: Lynn Mann is a 66 y.o. female who presents today for follow up.   HPI: CC: predominantly dry cough 2 weeks,   Improving.   Had been using an inhaler at that time as thought it was 'viral.'  Here today for reevalution from urgent care 5 days ago.   On prednisone ( 2 more days) and z pak  ( 1 more tab remaining) with improvement. Inhaler hasn't helped. No le swelling, fever, cp, palpitations.  Continues to cough, SOB, wheezing, HA ( 'little aches')  Also notes 'blisters on eardrum'. Had pain in ear when at urgent care however ear pain has resolved. Would like to have ears examined.   H/o asthma; on breo.     HISTORY:  Past Medical History:  Diagnosis Date  . Arthritis   . Chicken pox   . Cholecystitis 11/2011   Did not require sgy - Dr. Staci Acosta - [facility]  (cholelithiasis)  . H/O Clostridium difficile infection   . IBS (irritable bowel syndrome)   . MRSA exposure 2005   Spider bite  . MVP (mitral valve prolapse)    Stable - Dr. Ubaldo Glassing  . Rheumatic fever    Past Surgical History:  Procedure Laterality Date  . CATARACT EXTRACTION  [id number]  . MUSCLE BIOPSY     Family History  Problem Relation Age of Onset  . Arthritis Mother   . Stroke Mother   . Diabetes Mother   . Hypertension Mother   . Arthritis Father   . Stroke Father   . Diabetes Paternal Grandmother   . Cancer Paternal Uncle        colon  . Breast cancer Maternal Aunt   . Heart disease Unknown        maternal and paternal side  . Breast cancer Paternal Aunt   . Breast cancer Cousin   . Breast cancer Cousin        female cousin    Allergies: Adhesive [tape]; Tartrazine; and Yellow dyes (non-tartrazine) Current Outpatient Prescriptions on File Prior to Visit  Medication Sig Dispense Refill  . albuterol (PROVENTIL HFA;VENTOLIN HFA) 108 (90 Base) MCG/ACT inhaler Inhale 2 puffs into the lungs every 4  (four) hours as needed for wheezing or shortness of breath. 1 Inhaler 0  . azithromycin (ZITHROMAX) 250 MG tablet Take 1 tablet (250 mg total) by mouth daily. 2 tabs po on day 1, 1 tab po on days 2-5 6 tablet 0  . benzonatate (TESSALON) 200 MG capsule Take 1 capsule (200 mg total) by mouth 3 (three) times daily as needed for cough. 30 capsule 0  . chlorpheniramine-HYDROcodone (TUSSIONEX PENNKINETIC ER) 10-8 MG/5ML SUER Take 5 mLs by mouth every 12 (twelve) hours as needed for cough. 120 mL 0  . fluticasone furoate-vilanterol (BREO ELLIPTA) 100-25 MCG/INH AEPB Inhale 1 puff into the lungs daily. 60 each 5  . predniSONE (DELTASONE) 20 MG tablet Take 2 tablets (40 mg total) by mouth daily with breakfast. 10 tablet 0  . sodium chloride (OCEAN) 0.65 % SOLN nasal spray Place 2 sprays into both nostrils every 2 (two) hours while awake.  0  . Spacer/Aero-Holding Chambers (AEROCHAMBER PLUS) inhaler Use as instructed 1 each 2  . fluticasone (FLONASE) 50 MCG/ACT nasal spray Place 1 spray into both nostrils 2 (two) times daily. 16 g 5  . [DISCONTINUED] NIACIN, ANTIHYPERLIPIDEMIC, PO Take by  mouth 3 (three) times daily.     No current facility-administered medications on file prior to visit.     Social History  Substance Use Topics  . Smoking status: Never Smoker  . Smokeless tobacco: Never Used  . Alcohol use 0.0 oz/week     Comment: Rarely    Review of Systems  Constitutional: Negative for chills and fever.  HENT: Positive for congestion. Negative for ear discharge and ear pain.   Respiratory: Positive for cough, shortness of breath and wheezing.   Cardiovascular: Negative for chest pain and palpitations.  Gastrointestinal: Negative for nausea and vomiting.      Objective:    BP 134/80   Pulse 93   Temp 98.1 F (36.7 C) (Oral)   Ht 5\' 7"  (1.702 m)   Wt 193 lb 3.2 oz (87.6 kg)   SpO2 98%   BMI 30.26 kg/m  BP Readings from Last 3 Encounters:  12/12/16 134/80  12/08/16 111/63  10/07/16  126/70   Wt Readings from Last 3 Encounters:  12/12/16 193 lb 3.2 oz (87.6 kg)  12/08/16 188 lb (85.3 kg)  10/07/16 195 lb (88.5 kg)    Physical Exam  Constitutional: She appears well-developed and well-nourished.  HENT:  Head: Normocephalic and atraumatic.  Right Ear: Hearing, tympanic membrane, external ear and ear canal normal. No drainage, swelling or tenderness. No foreign bodies. Tympanic membrane is not erythematous and not bulging. No middle ear effusion. No decreased hearing is noted.  Left Ear: Hearing, tympanic membrane, external ear and ear canal normal. No drainage, swelling or tenderness. No foreign bodies. Tympanic membrane is not erythematous and not bulging.  No middle ear effusion. No decreased hearing is noted.  Nose: Nose normal. No rhinorrhea. Right sinus exhibits no maxillary sinus tenderness and no frontal sinus tenderness. Left sinus exhibits no maxillary sinus tenderness and no frontal sinus tenderness.  Mouth/Throat: Uvula is midline, oropharynx is clear and moist and mucous membranes are normal. No oropharyngeal exudate, posterior oropharyngeal edema, posterior oropharyngeal erythema or tonsillar abscesses.  Eyes: Conjunctivae are normal.  Cardiovascular: Regular rhythm, normal heart sounds and normal pulses.   Pulmonary/Chest: Effort normal. She has decreased breath sounds in the right lower field and the left lower field. She has no wheezes. She has no rhonchi. She has no rales.  Lymphadenopathy:       Head (right side): No submental, no submandibular, no tonsillar, no preauricular, no posterior auricular and no occipital adenopathy present.       Head (left side): No submental, no submandibular, no tonsillar, no preauricular, no posterior auricular and no occipital adenopathy present.    She has no cervical adenopathy.  Neurological: She is alert.  Skin: Skin is warm and dry.  Psychiatric: She has a normal mood and affect. Her speech is normal and behavior is  normal. Thought content normal.  Vitals reviewed.      Assessment & Plan:   1. Bronchitis Resting sa02 98. Briefly during walking sao2, went to 92% , then coughed, and sao2 went 97%. In no acute respiratory distress. Reassured as patient clinically is improving. We jointly agreed for her to finish prednisone, azithromycin and to let us know if not completely better. Pending chest x-ray.   - DG Chest 2 View   I am having Ms. Camargo maintain her fluticasone, sodium chloride, fluticasone furoate-vilanterol, albuterol, chlorpheniramine-HYDROcodone, benzonatate, AEROCHAMBER PLUS, predniSONE, and azithromycin.   No orders of the defined types were placed in this encounter.   Return precautions given.  Risks, benefits, and alternatives of the medications and treatment plan prescribed today were discussed, and patient expressed understanding.   Education regarding symptom management and diagnosis given to patient on AVS.  Continue to follow with Einar Pheasant, MD for routine health maintenance.   [name] and I agreed with plan.   Mable Paris, FNP

## 2016-12-12 NOTE — Patient Instructions (Addendum)
Pleased to hear you are feeling better; let me know if you do not continue to do so.   Chest Xray  Complete antibiotic, steriods and continue to use inhalers

## 2016-12-12 NOTE — Progress Notes (Signed)
Pre visit review using our clinic review tool, if applicable. No additional management support is needed unless otherwise documented below in the visit note. 

## 2016-12-17 ENCOUNTER — Ambulatory Visit: Payer: BLUE CROSS/BLUE SHIELD | Admitting: Family

## 2016-12-17 ENCOUNTER — Telehealth: Payer: Self-pay

## 2016-12-17 DIAGNOSIS — R197 Diarrhea, unspecified: Secondary | ICD-10-CM

## 2016-12-17 NOTE — Telephone Encounter (Addendum)
Reason for call:  Symptoms: Patient calls stating she is concerned about her  temperature 99.6 today.  She began to have symptoms  2 days   Of shakiness, nauseousness , loose bowel movement.  Then she was ok for  2 days and  Today loose stool, when cough she had  bowel incontinence, Today  She had 3  Bowel  Movements in the commode, 2-3 incontinence issues bowel episodes wearing depends.   today, eaten chicken and rice soup today 20- 30 minutes lost that through extracting through stool, now stool is slimy, not on probiotic, just finished Z-Pak  Advised if symptoms   history of loose stool, appointment scheduled , advised if she worsens to go to ED History of c diff 30 years ago  Duration 2 days  Medications: Last seen for this problem: 12/12/16 Seen by: Please advise.

## 2016-12-18 ENCOUNTER — Ambulatory Visit (INDEPENDENT_AMBULATORY_CARE_PROVIDER_SITE_OTHER): Payer: BLUE CROSS/BLUE SHIELD | Admitting: Family

## 2016-12-18 ENCOUNTER — Encounter: Payer: Self-pay | Admitting: Family

## 2016-12-18 VITALS — BP 120/74 | HR 100 | Temp 98.6°F | Resp 12 | Wt 191.6 lb

## 2016-12-18 DIAGNOSIS — R197 Diarrhea, unspecified: Secondary | ICD-10-CM | POA: Diagnosis not present

## 2016-12-18 NOTE — Patient Instructions (Addendum)
Plenty of water as discussed as suspect is contributing to weakness. Small sips throughout the day, popsicles as discussed Do not take anything such as Imodium  while we wait on stool cultures Stay on probiotics Advise to trial OTC zantac for acid reflux; follow up with Lynn Mann, if no improvement.   Let me know if  abdominal pain, fever, chills.   Food Choices to Help Relieve Diarrhea, Adult When you have diarrhea, the foods you eat and your eating habits are very important. Choosing the right foods and drinks can help:  Relieve diarrhea.  Replace lost fluids and nutrients.  Prevent dehydration. What general guidelines should I follow? Relieving diarrhea   Choose foods with less than 2 g or .07 oz. of fiber per serving.  Limit fats to less than 8 tsp (38 g or 1.34 oz.) a day.  Avoid the following:  Foods and beverages sweetened with high-fructose corn syrup, honey, or sugar alcohols such as xylitol, sorbitol, and mannitol.  Foods that contain a lot of fat or sugar.  Fried, greasy, or spicy foods.  High-fiber grains, breads, and cereals.  Raw fruits and vegetables.  Eat foods that are rich in probiotics. These foods include dairy products such as yogurt and fermented milk products. They help increase healthy bacteria in the stomach and intestines (gastrointestinal tract, or GI tract).  If you have lactose intolerance, avoid dairy products. These may make your diarrhea worse.  Take medicine to help stop diarrhea (antidiarrheal medicine) only as told by your health care provider. Replacing nutrients   Eat small meals or snacks every 3-4 hours.  Eat bland foods, such as white rice, toast, or baked potato, until your diarrhea starts to get better. Gradually reintroduce nutrient-rich foods as tolerated or as told by your health care provider. This includes:  Well-cooked protein foods.  Peeled, seeded, and soft-cooked fruits and vegetables.  Low-fat dairy products.  Take  vitamin and mineral supplements as told by your health care provider. Preventing dehydration    Start by sipping water or a special solution to prevent dehydration (oral rehydration solution, ORS). Urine that is clear or pale yellow means that you are getting enough fluid.  Try to drink at least 8-10 cups of fluid each day to help replace lost fluids.  You may add other liquids in addition to water, such as clear juice or decaffeinated sports drinks, as tolerated or as told by your health care provider.  Avoid drinks with caffeine, such as coffee, tea, or soft drinks.  Avoid alcohol. What foods are recommended? The items listed may not be a complete list. Talk with your health care provider about what dietary choices are best for you. Grains  White rice. White, [place], or pita breads (fresh or toasted), including plain rolls, buns, or bagels. White pasta. Saltine, soda, or graham crackers. Pretzels. Low-fiber cereal. Cooked cereals made with water (such as cornmeal, farina, or cream cereals). Plain muffins. Matzo. Melba toast. Zwieback. Vegetables  Potatoes (without the skin). Most well-cooked and canned vegetables without skins or seeds. Tender lettuce. Fruits  Apple sauce. Fruits canned in juice. Cooked apricots, cherries, grapefruit, peaches, pears, or plums. Fresh bananas and cantaloupe. Meats and other protein foods  Baked or boiled chicken. Eggs. Tofu. Fish. Seafood. Smooth nut butters. Ground or well-cooked tender beef, ham, veal, lamb, pork, or poultry. Dairy  Plain yogurt, kefir, and unsweetened liquid yogurt. Lactose-free milk, buttermilk, skim milk, or soy milk. Low-fat or nonfat hard cheese. Beverages  Water. Low-calorie sports drinks. Fruit  juices without pulp. Strained tomato and vegetable juices. Decaffeinated teas. Sugar-free beverages not sweetened with sugar alcohols. Oral rehydration solutions, if approved by your health care provider. Seasoning and other foods    Bouillon, broth, or soups made from recommended foods. What foods are not recommended? The items listed may not be a complete list. Talk with your health care provider about what dietary choices are best for you. Grains  Whole grain, whole wheat, bran, or rye breads, rolls, pastas, and crackers. Wild or brown rice. Whole grain or bran cereals. Barley. Oats and oatmeal. Corn tortillas or taco shells. Granola. Popcorn. Vegetables  Raw vegetables. Fried vegetables. Cabbage, broccoli, Brussels sprouts, artichokes, baked beans, beet greens, corn, kale, legumes, peas, sweet potatoes, and yams. Potato skins. Cooked spinach and cabbage. Fruits  Dried fruit, including raisins and dates. Raw fruits. Stewed or dried prunes. Canned fruits with syrup. Meat and other protein foods  Fried or fatty meats. Deli meats. Chunky nut butters. Nuts and seeds. Beans and lentils. Lynn Mann. Hot dogs. Sausage. Dairy  High-fat cheeses. Whole milk, chocolate milk, and beverages made with milk, such as milk shakes. Half-and-half. Cream. sour cream. Ice cream. Beverages  Caffeinated beverages (such as coffee, tea, soda, or energy drinks). Alcoholic beverages. Fruit juices with pulp. Prune juice. Soft drinks sweetened with high-fructose corn syrup or sugar alcohols. High-calorie sports drinks. Fats and oils  Butter. Cream sauces. Margarine. Salad oils. Plain salad dressings. Olives. Avocados. Mayonnaise. Sweets and desserts  Sweet rolls, doughnuts, and sweet breads. Sugar-free desserts sweetened with sugar alcohols such as xylitol and sorbitol. Seasoning and other foods  Honey. Hot sauce. Chili powder. Gravy. Cream-based or milk-based soups. Pancakes and waffles. Summary  When you have diarrhea, the foods you eat and your eating habits are very important.  Make sure you get at least 8-10 cups of fluid each day, or enough to keep your urine clear or pale yellow.  Eat bland foods and gradually reintroduce healthy,  nutrient-rich foods as tolerated, or as told by your health care provider.  Avoid high-fiber, fried, greasy, or spicy foods. This information is not intended to replace advice given to you by your health care provider. Make sure you discuss any questions you have with your health care provider. Document Released: 10/05/2003 Document Revised: 07/12/2016 Document Reviewed: 07/12/2016 Elsevier Interactive Patient Education  2017 [employer].

## 2016-12-18 NOTE — Telephone Encounter (Signed)
Seen for office visit 12/18/16

## 2016-12-18 NOTE — Progress Notes (Signed)
Subjective:    Patient ID: Lynn Mann, female    DOB: May 31, 1951, 66 y.o.   MRN: [id number]  CC: [name] is a 66 y.o. female who presents today for an acute visit.    HPI: CC: diarrhea x 4 days, improved. Bouts 2-3 x per day Notes diarrhea after eating food and if coughs. 'Not totally liquid.' No foul odor.   No abdominal pain, fever.  Notes 'not drinking enough water.' Endorses overall weakness past couple of days. No dizziness, syncope.  Endorses epigastric burning. 4 days ago had chills, nausea, and then diarrhea then. Non bloody.   Started z pak 10 days ago, and diarrhea onset 6 days after.       HISTORY:  Past Medical History:  Diagnosis Date  . Arthritis   . Chicken pox   . Cholecystitis 11/2011   Did not require sgy - Dr. Staci Acosta - [facility]  (cholelithiasis)  . H/O Clostridium difficile infection   . IBS (irritable bowel syndrome)   . MRSA exposure 2005   Spider bite  . MVP (mitral valve prolapse)    Stable - Dr. Ubaldo Glassing  . Rheumatic fever    Past Surgical History:  Procedure Laterality Date  . CATARACT EXTRACTION  [id number]  . MUSCLE BIOPSY     Family History  Problem Relation Age of Onset  . Arthritis Mother   . Stroke Mother   . Diabetes Mother   . Hypertension Mother   . Arthritis Father   . Stroke Father   . Diabetes Paternal Grandmother   . Cancer Paternal Uncle        colon  . Breast cancer Maternal Aunt   . Heart disease Unknown        maternal and paternal side  . Breast cancer Paternal Aunt   . Breast cancer Cousin   . Breast cancer Cousin        female cousin    Allergies: Adhesive [tape]; Tartrazine; and Yellow dyes (non-tartrazine) Current Outpatient Prescriptions on File Prior to Visit  Medication Sig Dispense Refill  . benzonatate (TESSALON) 200 MG capsule Take 1 capsule (200 mg total) by mouth 3 (three) times daily as needed for cough. 30 capsule 0  . chlorpheniramine-HYDROcodone (TUSSIONEX PENNKINETIC ER) 10-8 MG/5ML SUER  Take 5 mLs by mouth every 12 (twelve) hours as needed for cough. 120 mL 0  . fluticasone furoate-vilanterol (BREO ELLIPTA) 100-25 MCG/INH AEPB Inhale 1 puff into the lungs daily. 60 each 5  . sodium chloride (OCEAN) 0.65 % SOLN nasal spray Place 2 sprays into both nostrils every 2 (two) hours while awake.  0  . Spacer/Aero-Holding Chambers (AEROCHAMBER PLUS) inhaler Use as instructed 1 each 2  . albuterol (PROVENTIL HFA;VENTOLIN HFA) 108 (90 Base) MCG/ACT inhaler Inhale 2 puffs into the lungs every 4 (four) hours as needed for wheezing or shortness of breath. 1 Inhaler 0  . fluticasone (FLONASE) 50 MCG/ACT nasal spray Place 1 spray into both nostrils 2 (two) times daily. 16 g 5  . [DISCONTINUED] NIACIN, ANTIHYPERLIPIDEMIC, PO Take by mouth 3 (three) times daily.     No current facility-administered medications on file prior to visit.     Social History  Substance Use Topics  . Smoking status: Never Smoker  . Smokeless tobacco: Never Used  . Alcohol use 0.0 oz/week     Comment: Rarely    Review of Systems  Constitutional: Negative for chills and fever.  Respiratory: Negative for cough.   Cardiovascular: Negative for  chest pain and palpitations.  Gastrointestinal: Negative for nausea and vomiting.      Objective:    BP 120/74 (BP Location: Right Arm, Patient Position: Sitting, Cuff Size: Large)   Pulse 100   Temp 98.6 F (37 C) (Oral)   Resp 12   Wt 191 lb 9.6 oz (86.9 kg)   SpO2 96%   BMI 30.01 kg/m    Physical Exam  Constitutional: She appears well-developed and well-nourished.  Eyes: Conjunctivae are normal.  Cardiovascular: Normal rate, regular rhythm, normal heart sounds and normal pulses.   Pulmonary/Chest: Effort normal and breath sounds normal. She has no wheezes. She has no rhonchi. She has no rales.  Abdominal: Soft. Normal appearance and bowel sounds are normal. She exhibits no distension, no fluid wave, no ascites and no mass. There is no tenderness. There is no  rigidity, no rebound, no guarding and no CVA tenderness.  Neurological: She is alert.  Skin: Skin is warm and dry.  Psychiatric: She has a normal mood and affect. Her speech is normal and behavior is normal. Thought content normal.  Vitals reviewed.      Assessment & Plan:  1. Diarrhea, unspecified type Afebrile. Patient is well-appearing. Reassured by benign abdominal exam. Hemodynamically stable. Advised plenty of water. Pending stool cultures. Encouraged probiotics. No immodium. Return precautions given.    I have discontinued Ms. Guimont's azithromycin. I am also having her maintain her fluticasone, sodium chloride, fluticasone furoate-vilanterol, albuterol, chlorpheniramine-HYDROcodone, benzonatate, and AEROCHAMBER PLUS.   No orders of the defined types were placed in this encounter.   Return precautions given.   Risks, benefits, and alternatives of the medications and treatment plan prescribed today were discussed, and patient expressed understanding.   Education regarding symptom management and diagnosis given to patient on AVS.  Continue to follow with Einar Pheasant, MD for routine health maintenance.   [name] and I agreed with plan.   Mable Paris, FNP

## 2016-12-18 NOTE — Telephone Encounter (Signed)
Call pt  How is she doing today? How many episodes diarrhea? Is it more formed.    I have ordered stool  Culture due to h/o c diff. Please have her come in  Ensure to take probiotics while on antibiotics and also for 2 weeks after completion. It is important to re-colonize the gut with good bacteria and also to prevent any diarrheal infections associated with antibiotic use.

## 2016-12-18 NOTE — Progress Notes (Signed)
Pre-visit discussion using our clinic review tool. No additional management support is needed unless otherwise documented below in the visit note.  

## 2016-12-20 ENCOUNTER — Telehealth: Payer: Self-pay | Admitting: *Deleted

## 2016-12-20 NOTE — Telephone Encounter (Signed)
FYI Patient will bring in her Stool Sample on Tuesday .

## 2016-12-25 ENCOUNTER — Other Ambulatory Visit: Payer: BLUE CROSS/BLUE SHIELD

## 2016-12-25 DIAGNOSIS — R197 Diarrhea, unspecified: Secondary | ICD-10-CM

## 2016-12-26 LAB — C. DIFFICILE GDH AND TOXIN A/B
C. difficile GDH: NOT DETECTED
C. difficile Toxin A/B: NOT DETECTED

## 2016-12-29 LAB — STOOL CULTURE

## 2017-01-16 ENCOUNTER — Ambulatory Visit: Payer: BLUE CROSS/BLUE SHIELD | Admitting: Internal Medicine

## 2017-01-20 ENCOUNTER — Ambulatory Visit (INDEPENDENT_AMBULATORY_CARE_PROVIDER_SITE_OTHER): Payer: BLUE CROSS/BLUE SHIELD | Admitting: Internal Medicine

## 2017-01-20 ENCOUNTER — Telehealth: Payer: Self-pay | Admitting: *Deleted

## 2017-01-20 ENCOUNTER — Encounter: Payer: Self-pay | Admitting: Internal Medicine

## 2017-01-20 DIAGNOSIS — R079 Chest pain, unspecified: Secondary | ICD-10-CM

## 2017-01-20 DIAGNOSIS — R0602 Shortness of breath: Secondary | ICD-10-CM

## 2017-01-20 DIAGNOSIS — R197 Diarrhea, unspecified: Secondary | ICD-10-CM

## 2017-01-20 DIAGNOSIS — E7800 Pure hypercholesterolemia, unspecified: Secondary | ICD-10-CM | POA: Diagnosis not present

## 2017-01-20 DIAGNOSIS — R739 Hyperglycemia, unspecified: Secondary | ICD-10-CM | POA: Diagnosis not present

## 2017-01-20 DIAGNOSIS — J4520 Mild intermittent asthma, uncomplicated: Secondary | ICD-10-CM | POA: Diagnosis not present

## 2017-01-20 DIAGNOSIS — R5383 Other fatigue: Secondary | ICD-10-CM

## 2017-01-20 NOTE — Telephone Encounter (Signed)
Pt requested a call, she would like to know what labs will be drawn prior to her advised lab appt.  Pt contact [phone]

## 2017-01-20 NOTE — Telephone Encounter (Signed)
Patient called wanted to know what labs you wanted to order for her. I informed they have not been put in the system yet. I would return call with that information tomorrow after note has been done and information is in the system.

## 2017-01-20 NOTE — Progress Notes (Signed)
Pre-visit discussion using our clinic review tool. No additional management support is needed unless otherwise documented below in the visit note.  

## 2017-01-20 NOTE — Progress Notes (Signed)
Patient ID: Lynn Mann, female   DOB: [birth year], 66 y.o.   MRN: [id number]   Subjective:    Patient ID: Lynn Mann, female    DOB: Jan 11, 1951, 66 y.o.   MRN: [id number]  HPI  Patient here for a scheduled follow up.  She has seen Dr Alva Garnet previously.  Diagnosed with mild asthma.  She report noticing some increased fatigue.  Has had a couple of episodes of waking and feeling like she cannot get her breath.  Has also noticed increased heart rate with walking.  Some chest discomfort.  Declines f/u with Dr Alva Garnet at this time.   Discussed possible sleep apnea.  Discussed sleep study.  She declines.  No acid reflux.  Some occasional nausea.  Previous diarrhea.  Resolved.  Discussed probiotics.  No abdominal pain.     Past Medical History:  Diagnosis Date  . Arthritis   . Chicken pox   . Cholecystitis 11/2011   Did not require sgy - Dr. Staci Acosta - [facility]  (cholelithiasis)  . H/O Clostridium difficile infection   . IBS (irritable bowel syndrome)   . MRSA exposure 2005   Spider bite  . MVP (mitral valve prolapse)    Stable - Dr. Ubaldo Glassing  . Rheumatic fever    Past Surgical History:  Procedure Laterality Date  . CATARACT EXTRACTION  [id number]  . MUSCLE BIOPSY     Family History  Problem Relation Age of Onset  . Arthritis Mother   . Stroke Mother   . Diabetes Mother   . Hypertension Mother   . Arthritis Father   . Stroke Father   . Diabetes Paternal Grandmother   . Cancer Paternal Uncle        colon  . Breast cancer Maternal Aunt   . Heart disease Unknown        maternal and paternal side  . Breast cancer Paternal Aunt   . Breast cancer Cousin   . Breast cancer Cousin        female cousin   Social History   Social History  . Marital status: Married    Spouse name: N/A  . Number of children: N/A  . Years of education: 55   Occupational History  . Retired    Social History Main Topics  . Smoking status: Never Smoker  . Smokeless tobacco: Never Used  . Alcohol use  0.0 oz/week     Comment: Rarely  . Drug use: No  . Sexual activity: Yes    Partners: Male    Birth control/ protection: Post-menopausal   Other Topics Concern  . None   Social History Narrative  . None    Outpatient Encounter Prescriptions as of 01/20/2017  Medication Sig  . albuterol (PROVENTIL HFA;VENTOLIN HFA) 108 (90 Base) MCG/ACT inhaler Inhale 2 puffs into the lungs every 4 (four) hours as needed for wheezing or shortness of breath.  . fluticasone (FLONASE) 50 MCG/ACT nasal spray Place 1 spray into both nostrils 2 (two) times daily.  . fluticasone furoate-vilanterol (BREO ELLIPTA) 100-25 MCG/INH AEPB Inhale 1 puff into the lungs daily. (Patient not taking: Reported on 01/20/2017)  . sodium chloride (OCEAN) 0.65 % SOLN nasal spray Place 2 sprays into both nostrils every 2 (two) hours while awake. (Patient not taking: Reported on 01/20/2017)  . Spacer/Aero-Holding Chambers (AEROCHAMBER PLUS) inhaler Use as instructed (Patient not taking: Reported on 01/20/2017)  . [DISCONTINUED] benzonatate (TESSALON) 200 MG capsule Take 1 capsule (200 mg total) by mouth 3 (three) times  daily as needed for cough.  . [DISCONTINUED] chlorpheniramine-HYDROcodone (TUSSIONEX PENNKINETIC ER) 10-8 MG/5ML SUER Take 5 mLs by mouth every 12 (twelve) hours as needed for cough.   No facility-administered encounter medications on file as of 01/20/2017.     Review of Systems  Constitutional: Positive for fatigue. Negative for appetite change and unexpected weight change.  HENT: Negative for congestion and sinus pressure.   Respiratory: Negative for cough and chest tightness.   Cardiovascular: Negative for leg swelling.       Chest discomfort as outlined.  Episodes of increased heart rate as outlined.    Gastrointestinal: Positive for nausea. Negative for abdominal pain and vomiting.       Diarrhea better.   Genitourinary: Negative for difficulty urinating and dysuria.  Musculoskeletal: Negative for back pain and  joint swelling.  Skin: Negative for color change and rash.  Neurological: Negative for dizziness, light-headedness and headaches.  Psychiatric/Behavioral: Negative for agitation and dysphoric mood.       Objective:    Physical Exam  Constitutional: She appears well-developed and well-nourished. No distress.  HENT:  Nose: Nose normal.  Mouth/Throat: Oropharynx is clear and moist.  Neck: Neck supple. No thyromegaly present.  Cardiovascular: Normal rate and regular rhythm.   Pulmonary/Chest: Breath sounds normal. No respiratory distress. She has no wheezes.  Abdominal: Soft. Bowel sounds are normal. There is no tenderness.  Musculoskeletal: She exhibits no edema or tenderness.  Lymphadenopathy:    She has no cervical adenopathy.  Skin: No rash noted. No erythema.  Psychiatric: She has a normal mood and affect. Her behavior is normal.    BP 118/72 (BP Location: Left Arm, Patient Position: Sitting, Cuff Size: Normal)   Pulse 85   Temp 98.6 F (37 C) (Oral)   Resp 12   Ht _0  (1.702 m)   Wt 188 lb 9.6 oz (85.5 kg)   SpO2 95%   BMI 29.54 kg/m  Wt Readings from Last 3 Encounters:  01/20/17 188 lb 9.6 oz (85.5 kg)  12/18/16 191 lb 9.6 oz (86.9 kg)  12/12/16 193 lb 3.2 oz (87.6 kg)     Lab Results  Component Value Date   WBC 5.7 01/23/2016   HGB 13.9 01/23/2016   HCT 41.3 01/23/2016   PLT 291.0 01/23/2016   GLUCOSE 87 07/15/2016   CHOL 217 (H) 07/15/2016   TRIG 102.0 07/15/2016   HDL 41.70 07/15/2016   LDLDIRECT 124.3 01/18/2013   LDLCALC 155 (H) 07/15/2016   ALT 14 08/02/2016   AST 16 08/02/2016   NA 141 07/15/2016   K 4.6 07/15/2016   CL 105 07/15/2016   CREATININE 0.68 07/15/2016   BUN 14 07/15/2016   CO2 25 07/15/2016   TSH 2.39 01/23/2016   HGBA1C 5.8 07/25/2015       Assessment & Plan:   Problem List Items Addressed This Visit    Asthma    Has seen Dr Alva Garnet as outlined.  Declines f/u at this time.  Restart inhalers.        Diarrhea     Diarrhea better.  Take probiotic.  Follow.        Fatigue    Felt to be multifactorial.  Is walking now.  Has a f/u scheduled with cardiology.  Given notice of some chest discomfort, some sob as outlined and increased fatigue with walking - EKG obtained.  Revealed SR with no acute ischemic changes.  Has f/u appt scheduled with Dr Ubaldo Glassing in a couple of weeks.  See  if we can arrange earlier follow up.  Wanted to pursue sleep study.  She declines at this time.  Will notify me when agreeable.  Check routine labs, including metabolic panel, cbc and tsh.        Relevant Orders   CBC with Differential/Platelet   TSH   Basic metabolic panel   Hypercholesterolemia    Low cholesterol diet and exercise.  Follow lipid panel.        Relevant Orders   Hepatic function panel   Lipid panel   Hyperglycemia    Low carb diet and exercise.  Will follow.  Check met b and a1c.        Relevant Orders   Hemoglobin A1c   SOB (shortness of breath)    Notices some sob and a couple of episodes where awakens with feeling of not being able to get her breath.  Has seen Dr Alva Garnet.  she declines f/u with him (at this time).   Diagnosed with mild asthma.  Restart inhalers as directed.  Has not been taking regularly.  Discussed the possibility of sleep apnea.  Discussed sleep study.  She declines.        Relevant Orders   Ambulatory referral to Cardiology    Other Visit Diagnoses    Chest pain, unspecified type       Relevant Orders   EKG 12-Lead (Completed)   Ambulatory referral to Cardiology     I spent 40 minutes with the patient and more than 50% of the time was spent in consultation regarding the above.  Time spent discussing her current concerns, issues and symptoms.  Also time spent discussing further treatment and plans for further evaluation.     Einar Pheasant, MD

## 2017-01-20 NOTE — Telephone Encounter (Signed)
Left message to return call to our office.  

## 2017-01-21 NOTE — Assessment & Plan Note (Signed)
Low cholesterol diet and exercise.  Follow lipid panel.   

## 2017-01-21 NOTE — Telephone Encounter (Signed)
Lab orders are in

## 2017-01-21 NOTE — Telephone Encounter (Signed)
Left message to return call to our office.  

## 2017-01-21 NOTE — Assessment & Plan Note (Signed)
Felt to be multifactorial.  Is walking now.  Has a f/u scheduled with cardiology.  Given notice of some chest discomfort, some sob as outlined and increased fatigue with walking - EKG obtained.  Revealed SR with no acute ischemic changes.  Has f/u appt scheduled with Dr Ubaldo Glassing in a couple of weeks.  See if we can arrange earlier follow up.  Wanted to pursue sleep study.  She declines at this time.  Will notify me when agreeable.  Check routine labs, including metabolic panel, cbc and tsh.

## 2017-01-21 NOTE — Telephone Encounter (Signed)
Pt called back returning your call. Please advise, thank you!  Call pt @ [phone]

## 2017-01-21 NOTE — Assessment & Plan Note (Signed)
Low carb diet and exercise.  Will follow.  Check met b and a1c.

## 2017-01-22 ENCOUNTER — Encounter: Payer: Self-pay | Admitting: Internal Medicine

## 2017-01-22 NOTE — Assessment & Plan Note (Signed)
Has seen Dr Alva Garnet as outlined.  Declines f/u at this time.  Restart inhalers.

## 2017-01-22 NOTE — Telephone Encounter (Signed)
Reached patient gave all information.

## 2017-01-22 NOTE — Assessment & Plan Note (Signed)
Notices some sob and a couple of episodes where awakens with feeling of not being able to get her breath.  Has seen Dr Alva Garnet.  she declines f/u with him (at this time).   Diagnosed with mild asthma.  Restart inhalers as directed.  Has not been taking regularly.  Discussed the possibility of sleep apnea.  Discussed sleep study.  She declines.

## 2017-01-22 NOTE — Assessment & Plan Note (Signed)
Diarrhea better.  Take probiotic.  Follow.

## 2017-02-11 ENCOUNTER — Other Ambulatory Visit (INDEPENDENT_AMBULATORY_CARE_PROVIDER_SITE_OTHER): Payer: BLUE CROSS/BLUE SHIELD

## 2017-02-11 ENCOUNTER — Other Ambulatory Visit: Payer: Self-pay | Admitting: Internal Medicine

## 2017-02-11 DIAGNOSIS — R739 Hyperglycemia, unspecified: Secondary | ICD-10-CM

## 2017-02-11 DIAGNOSIS — E7800 Pure hypercholesterolemia, unspecified: Secondary | ICD-10-CM | POA: Diagnosis not present

## 2017-02-11 DIAGNOSIS — R5383 Other fatigue: Secondary | ICD-10-CM

## 2017-02-11 DIAGNOSIS — R7989 Other specified abnormal findings of blood chemistry: Secondary | ICD-10-CM | POA: Diagnosis not present

## 2017-02-11 LAB — BASIC METABOLIC PANEL
BUN: 12 mg/dL (ref 6–23)
CO2: 23 mEq/L (ref 19–32)
Calcium: 9.3 mg/dL (ref 8.4–10.5)
Chloride: 106 mEq/L (ref 96–112)
Creatinine, Ser: 0.64 mg/dL (ref 0.40–1.20)
GFR: 99.17 mL/min (ref >60.00)
Glucose, Bld: 109 mg/dL — ABNORMAL HIGH (ref 70–99)
Potassium: 4 mEq/L (ref 3.5–5.1)
Sodium: 140 mEq/L (ref 135–145)

## 2017-02-11 LAB — CBC WITH DIFFERENTIAL/PLATELET
Basophils Absolute: 0 10*3/uL (ref 0.0–0.1)
Basophils Relative: 0.8 % (ref 0.0–3.0)
Eosinophils Absolute: 0.1 10*3/uL (ref 0.0–0.7)
Eosinophils Relative: 2.7 % (ref 0.0–5.0)
HCT: 42.2 % (ref 36.0–46.0)
Hemoglobin: 14.3 g/dL (ref 12.0–15.0)
Lymphocytes Relative: 35.3 % (ref 12.0–46.0)
Lymphs Abs: 1.6 10*3/uL (ref 0.7–4.0)
MCHC: 33.9 g/dL (ref 30.0–36.0)
MCV: 86.9 fl (ref 78.0–100.0)
Monocytes Absolute: 0.3 10*3/uL (ref 0.1–1.0)
Monocytes Relative: 6.7 % (ref 3.0–12.0)
Neutro Abs: 2.5 10*3/uL (ref 1.4–7.7)
Neutrophils Relative %: 54.5 % (ref 43.0–77.0)
Platelets: 277 10*3/uL (ref 150.0–400.0)
RBC: 4.85 Mil/uL (ref 3.87–5.11)
RDW: 14 % (ref 11.5–15.5)
WBC: 4.7 10*3/uL (ref 4.0–10.5)

## 2017-02-11 LAB — LIPID PANEL
Cholesterol: 174 mg/dL (ref 0–200)
HDL: 34 mg/dL — ABNORMAL LOW (ref >39.00)
NonHDL: 139.98
Total CHOL/HDL Ratio: 5
Triglycerides: 251 mg/dL — ABNORMAL HIGH (ref 0.0–149.0)
VLDL: 50.2 mg/dL — ABNORMAL HIGH (ref 0.0–40.0)

## 2017-02-11 LAB — HEPATIC FUNCTION PANEL
ALT: 14 U/L (ref 0–35)
AST: 20 U/L (ref 0–37)
Albumin: 4.1 g/dL (ref 3.5–5.2)
Alkaline Phosphatase: 48 U/L (ref 39–117)
Bilirubin, Direct: 0.2 mg/dL (ref 0.0–0.3)
Total Bilirubin: 0.9 mg/dL (ref 0.2–1.2)
Total Protein: 6.6 g/dL (ref 6.0–8.3)

## 2017-02-11 LAB — LDL CHOLESTEROL, DIRECT: Direct LDL: 110 mg/dL

## 2017-02-11 LAB — TSH: TSH: 3.77 u[IU]/mL (ref 0.35–4.50)

## 2017-02-11 NOTE — Progress Notes (Signed)
Orders placed for f/u labs.  

## 2017-03-03 ENCOUNTER — Telehealth: Payer: Self-pay | Admitting: Internal Medicine

## 2017-03-03 NOTE — Telephone Encounter (Signed)
Is this something we can do?

## 2017-03-03 NOTE — Telephone Encounter (Signed)
Need to know what specific symptoms she is having and then can determine if tests necessary.  Also confirm no acute issues that she feels she needs to be seen earlier for.  (she is just coming in for f/u a1c, if she wants to wait until her appt and let me do labs after I see her, she can do this as well and that may help to better determine tests needed).

## 2017-03-03 NOTE — Telephone Encounter (Signed)
Left message to call office

## 2017-03-03 NOTE — Telephone Encounter (Signed)
Pt wanted to know if there was any type of lab that could be added on for muscle spasms or have an ANA lab.. Pt has labs on 8/13/

## 2017-03-03 NOTE — Telephone Encounter (Signed)
Pt called back returning your call. Please advise, thank you!  Call pt @ [phone]

## 2017-03-04 NOTE — Telephone Encounter (Signed)
Left message with patient to return my call.

## 2017-03-05 NOTE — Telephone Encounter (Signed)
Pt called back returning your call. Please advise, thank you!  Call pt @ [phone]

## 2017-03-06 NOTE — Telephone Encounter (Signed)
She is going to wait and see you to let you determine the best test to do

## 2017-03-06 NOTE — Telephone Encounter (Signed)
See previous message.  Does she want to get with next labs or just have lab drawn when she comes in.  If waits until comes in, then can do any other test needed based on exam and symptoms.

## 2017-03-06 NOTE — Telephone Encounter (Signed)
She would like to know which way would be covered by insurance.

## 2017-03-06 NOTE — Telephone Encounter (Signed)
I am not sure about her insurance.  She can call her insurance and confirm.  If not urgent, I can see her and then determine best test to do and then just draw labs at her appt.

## 2017-03-06 NOTE — Telephone Encounter (Signed)
Called patient states that she has been having muscle fatigue x 3 weeks off and on sometimes it can last 30 minutes up to several hours. She states that she felt like this about 55yrs ago when she had rheumatic fever. She does not want to be seen but if insurance will cover she would like to have ana done.

## 2017-03-10 ENCOUNTER — Other Ambulatory Visit (INDEPENDENT_AMBULATORY_CARE_PROVIDER_SITE_OTHER): Payer: BLUE CROSS/BLUE SHIELD

## 2017-03-10 ENCOUNTER — Telehealth: Payer: Self-pay | Admitting: *Deleted

## 2017-03-10 ENCOUNTER — Telehealth: Payer: Self-pay

## 2017-03-10 DIAGNOSIS — E7800 Pure hypercholesterolemia, unspecified: Secondary | ICD-10-CM

## 2017-03-10 DIAGNOSIS — R739 Hyperglycemia, unspecified: Secondary | ICD-10-CM

## 2017-03-10 LAB — LIPID PANEL
Cholesterol: 218 mg/dL — ABNORMAL HIGH (ref <200)
HDL: 42 mg/dL — ABNORMAL LOW (ref >50)
LDL Cholesterol: 155 mg/dL — ABNORMAL HIGH (ref <100)
Total CHOL/HDL Ratio: 5.2 Ratio — ABNORMAL HIGH (ref <5.0)
Triglycerides: 107 mg/dL (ref <150)
VLDL: 21 mg/dL (ref <30)

## 2017-03-10 LAB — HEMOGLOBIN A1C: Hgb A1c MFr Bld: 5.2 % (ref 4.6–6.5)

## 2017-03-10 NOTE — Addendum Note (Signed)
Addended by: Arby Barrette on: 03/10/2017 12:56 PM   Modules accepted: Orders

## 2017-03-10 NOTE — Telephone Encounter (Signed)
Patient came in the office for labs today and wanted to see if she could see the nurse regarding dome bruises on her arms.  Brought patient back to nurse room, patient has bruises on left forearm, has a knot under the bruise.  Denies injury to right arm.  Currently has bandage on right arm from lab draw today.  Per patient last time blood work was drawn (02/11/17) the left antecubital area was bruised for days, but it was a hard stick and they had to manipulate a lot to get a blood return.  Per the patient she takes a aspirin every so often, not other medications.  Labs from 7/17, no issues with H/H.  Conferred with Dr. Nicki Reaper, advised that patient has a Appt next week on the 23rd, can follow up then.  She agreed.  Discussed with patient and she will comply.  Patient did ask me what labs were drawn today, I advised a HA1C and a fasting glucose, she was not happy as she thought a triglyceride was redrawn since her levels were so high last month.  She has been working on her diet and took carbs out completely and added a lot of spinach and greens with small portions of protein/meats and eggs.  I advised that I would send message to PCP and ask if that was suppose to be redrawn today.  Thanks

## 2017-03-10 NOTE — Telephone Encounter (Signed)
Per Lavella Lemons, states bruising present.  She did not feel pt needed evaluation.  Comfortable with f/u next week.  Explained that too soon to recheck lipids.  Pt desired to have checked anyway.  Signed waiver.

## 2017-03-10 NOTE — Telephone Encounter (Signed)
Called patient just wanted to make sure that the cholesterol was added. Informed her that it was added.

## 2017-03-10 NOTE — Telephone Encounter (Signed)
Pt requested a call to discuss her triglyceride test  Pt contact [phone]

## 2017-03-11 LAB — GLUCOSE, FASTING: Glucose, Fasting: 87 mg/dL (ref 65–99)

## 2017-03-13 ENCOUNTER — Other Ambulatory Visit: Payer: BLUE CROSS/BLUE SHIELD

## 2017-03-20 ENCOUNTER — Ambulatory Visit (INDEPENDENT_AMBULATORY_CARE_PROVIDER_SITE_OTHER): Payer: BLUE CROSS/BLUE SHIELD | Admitting: Internal Medicine

## 2017-03-20 ENCOUNTER — Encounter: Payer: Self-pay | Admitting: Internal Medicine

## 2017-03-20 VITALS — BP 120/76 | HR 97 | Temp 98.6°F | Resp 12 | Ht 67.0 in | Wt 177.6 lb

## 2017-03-20 DIAGNOSIS — J029 Acute pharyngitis, unspecified: Secondary | ICD-10-CM | POA: Diagnosis not present

## 2017-03-20 DIAGNOSIS — E7800 Pure hypercholesterolemia, unspecified: Secondary | ICD-10-CM | POA: Diagnosis not present

## 2017-03-20 DIAGNOSIS — R739 Hyperglycemia, unspecified: Secondary | ICD-10-CM | POA: Diagnosis not present

## 2017-03-20 DIAGNOSIS — J4520 Mild intermittent asthma, uncomplicated: Secondary | ICD-10-CM | POA: Diagnosis not present

## 2017-03-20 NOTE — Progress Notes (Signed)
Pre-visit discussion using our clinic review tool. No additional management support is needed unless otherwise documented below in the visit note.  

## 2017-03-21 ENCOUNTER — Ambulatory Visit: Payer: BLUE CROSS/BLUE SHIELD | Admitting: Internal Medicine

## 2017-03-22 ENCOUNTER — Encounter: Payer: Self-pay | Admitting: Internal Medicine

## 2017-03-22 LAB — CULTURE, GROUP A STREP

## 2017-03-22 NOTE — Progress Notes (Signed)
Patient ID: Lynn Mann, female   DOB: 09-14-1950, 66 y.o.   MRN: [id number]   Subjective:    Patient ID: Lynn Mann, female    DOB: 08-Mar-1951, 66 y.o.   MRN: [id number]  HPI  Patient here for a scheduled follow up.  She recently saw cardiology.  Had negative stress test and echo.  Has adjusted her diet.  Lost weight.  Was found to have elevated triglycerides.  Adjusted diet.  Wanted repeat cholesterol check.  LDL elevated.  Had questions about the elevation.  Discussed diet and exercise.  Discussed starting a cholesterol medication.  She declines.  No chest pain.  Breathing stable.  No acid reflux.  No abdominal pain.  Bowels moving.  She is concerned regarding sore throat.  Present for 2-3 days.  No fever.     Past Medical History:  Diagnosis Date  . Arthritis   . Chicken pox   . Cholecystitis 11/2011   Did not require sgy - Dr. Staci Acosta - [facility]  (cholelithiasis)  . H/O Clostridium difficile infection   . IBS (irritable bowel syndrome)   . MRSA exposure 2005   Spider bite  . MVP (mitral valve prolapse)    Stable - Dr. Ubaldo Glassing  . Rheumatic fever    Past Surgical History:  Procedure Laterality Date  . CATARACT EXTRACTION  [id number]  . MUSCLE BIOPSY     Family History  Problem Relation Age of Onset  . Arthritis Mother   . Stroke Mother   . Diabetes Mother   . Hypertension Mother   . Arthritis Father   . Stroke Father   . Diabetes Paternal Grandmother   . Cancer Paternal Uncle        colon  . Breast cancer Maternal Aunt   . Heart disease Unknown        maternal and paternal side  . Breast cancer Paternal Aunt   . Breast cancer Cousin   . Breast cancer Cousin        female cousin   Social History   Social History  . Marital status: Married    Spouse name: N/A  . Number of children: N/A  . Years of education: 34   Occupational History  . Retired    Social History Main Topics  . Smoking status: Never Smoker  . Smokeless tobacco: Never Used  . Alcohol use 0.0  oz/week     Comment: Rarely  . Drug use: No  . Sexual activity: Yes    Partners: Male    Birth control/ protection: Post-menopausal   Other Topics Concern  . None   Social History Narrative  . None    Outpatient Encounter Prescriptions as of 03/20/2017  Medication Sig  . albuterol (PROVENTIL HFA;VENTOLIN HFA) 108 (90 Base) MCG/ACT inhaler Inhale 2 puffs into the lungs every 4 (four) hours as needed for wheezing or shortness of breath.  . fluticasone (FLONASE) 50 MCG/ACT nasal spray Place 1 spray into both nostrils 2 (two) times daily.  . sodium chloride (OCEAN) 0.65 % SOLN nasal spray Place 2 sprays into both nostrils every 2 (two) hours while awake.  . fluticasone furoate-vilanterol (BREO ELLIPTA) 100-25 MCG/INH AEPB Inhale 1 puff into the lungs daily. (Patient not taking: Reported on 01/20/2017)  . [DISCONTINUED] Spacer/Aero-Holding Chambers (AEROCHAMBER PLUS) inhaler Use as instructed (Patient not taking: Reported on 01/20/2017)   No facility-administered encounter medications on file as of 03/20/2017.     Review of Systems  Constitutional: Negative for appetite change  and unexpected weight change.  HENT: Positive for sore throat. Negative for congestion and sinus pressure.   Respiratory: Negative for cough, chest tightness and shortness of breath.   Cardiovascular: Negative for chest pain, palpitations and leg swelling.  Gastrointestinal: Negative for abdominal pain, diarrhea, nausea and vomiting.  Genitourinary: Negative for difficulty urinating and dysuria.  Musculoskeletal: Negative for back pain and joint swelling.  Skin: Negative for color change and rash.  Neurological: Negative for dizziness, light-headedness and headaches.  Psychiatric/Behavioral: Negative for agitation and dysphoric mood.       Objective:    Physical Exam  Constitutional: She appears well-developed and well-nourished. No distress.  HENT:  Nose: Nose normal.  Mouth/Throat: Oropharynx is clear and  moist.  Neck: Neck supple. No thyromegaly present.  Cardiovascular: Normal rate and regular rhythm.   Pulmonary/Chest: Breath sounds normal. No respiratory distress. She has no wheezes.  Abdominal: Soft. Bowel sounds are normal. There is no tenderness.  Musculoskeletal: She exhibits no edema or tenderness.  Lymphadenopathy:    She has no cervical adenopathy.  Skin: No rash noted. No erythema.  Psychiatric: She has a normal mood and affect. Her behavior is normal.    BP 120/76 (BP Location: Left Arm, Patient Position: Sitting, Cuff Size: Normal)   Pulse 97   Temp 98.6 F (37 C) (Oral)   Resp 12   Ht '5\' 7"'  (1.702 m)   Wt 177 lb 9.6 oz (80.6 kg)   SpO2 100%   BMI 27.82 kg/m  Wt Readings from Last 3 Encounters:  03/20/17 177 lb 9.6 oz (80.6 kg)  01/20/17 188 lb 9.6 oz (85.5 kg)  12/18/16 191 lb 9.6 oz (86.9 kg)     Lab Results  Component Value Date   WBC 4.7 02/11/2017   HGB 14.3 02/11/2017   HCT 42.2 02/11/2017   PLT 277.0 02/11/2017   GLUCOSE 109 (H) 02/11/2017   CHOL 218 (H) 03/10/2017   TRIG 107 03/10/2017   HDL 42 (L) 03/10/2017   LDLDIRECT 110.0 02/11/2017   LDLCALC 155 (H) 03/10/2017   ALT 14 02/11/2017   AST 20 02/11/2017   NA 140 02/11/2017   K 4.0 02/11/2017   CL 106 02/11/2017   CREATININE 0.64 02/11/2017   BUN 12 02/11/2017   CO2 23 02/11/2017   TSH 3.77 02/11/2017   HGBA1C 5.2 03/10/2017       Assessment & Plan:   Problem List Items Addressed This Visit    Asthma    Has seen Dr Alva Garnet.  Breathing stable.        Hypercholesterolemia    Low cholesterol diet and exercise.  Discussed starting cholesterol medication.  She declines.  Follow lipid panel.        Hyperglycemia    Low carb diet and exercise.  Follow met b and a1c.  Has adjusted diet and lost weight.         Other Visit Diagnoses    Sore throat    -  Primary   Persistent sore throat.  check throat culture.  she declins [facility] mouthwash.  follow.     Relevant Orders   Culture,  Group A Strep (Completed)       Einar Pheasant, MD\

## 2017-03-22 NOTE — Assessment & Plan Note (Signed)
Has seen Dr Alva Garnet.  Breathing stable.

## 2017-03-22 NOTE — Assessment & Plan Note (Signed)
Low carb diet and exercise.  Follow met b and a1c.  Has adjusted diet and lost weight.

## 2017-03-22 NOTE — Assessment & Plan Note (Signed)
Low cholesterol diet and exercise.  Discussed starting cholesterol medication.  She declines.  Follow lipid panel.

## 2017-04-15 ENCOUNTER — Encounter: Payer: Self-pay | Admitting: *Deleted

## 2017-04-15 ENCOUNTER — Ambulatory Visit (INDEPENDENT_AMBULATORY_CARE_PROVIDER_SITE_OTHER): Payer: BLUE CROSS/BLUE SHIELD

## 2017-04-15 ENCOUNTER — Ambulatory Visit
Admission: EM | Admit: 2017-04-15 | Discharge: 2017-04-15 | Disposition: A | Discharge status: Home / Self Care | Payer: BLUE CROSS/BLUE SHIELD | Attending: Family Medicine | Admitting: Family Medicine

## 2017-04-15 DIAGNOSIS — D1721 Benign lipomatous neoplasm of skin and subcutaneous tissue of right arm: Secondary | ICD-10-CM | POA: Diagnosis not present

## 2017-04-15 DIAGNOSIS — R229 Localized swelling, mass and lump, unspecified: Secondary | ICD-10-CM | POA: Diagnosis not present

## 2017-04-15 IMAGING — CR DG FOREARM 2V*R*
2 series · 2 of 2 positions shown · non-contrast
Comparison: None.

CLINICAL DATA: Mass and skin discoloration on right forearm. No
known injury.

EXAM:
RIGHT FOREARM - 2 VIEW

[forearm ap]
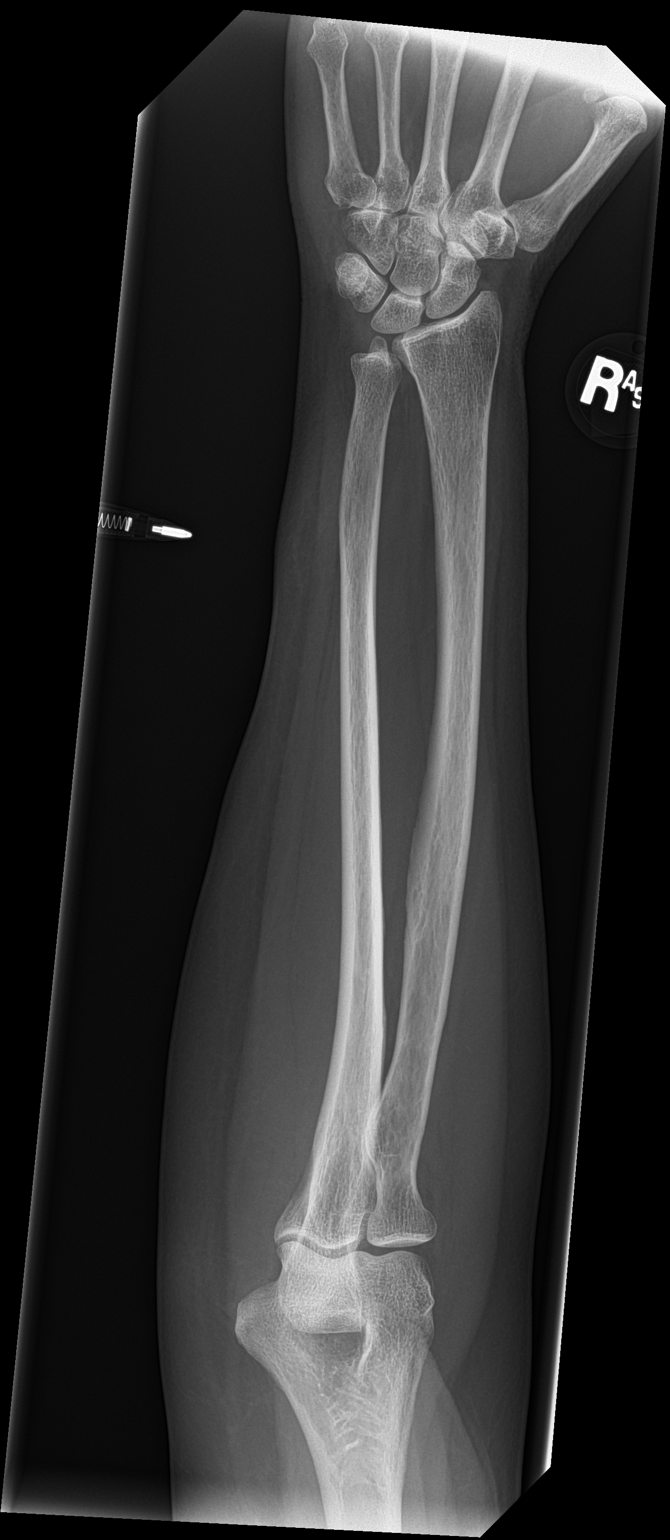

[forearm lat]
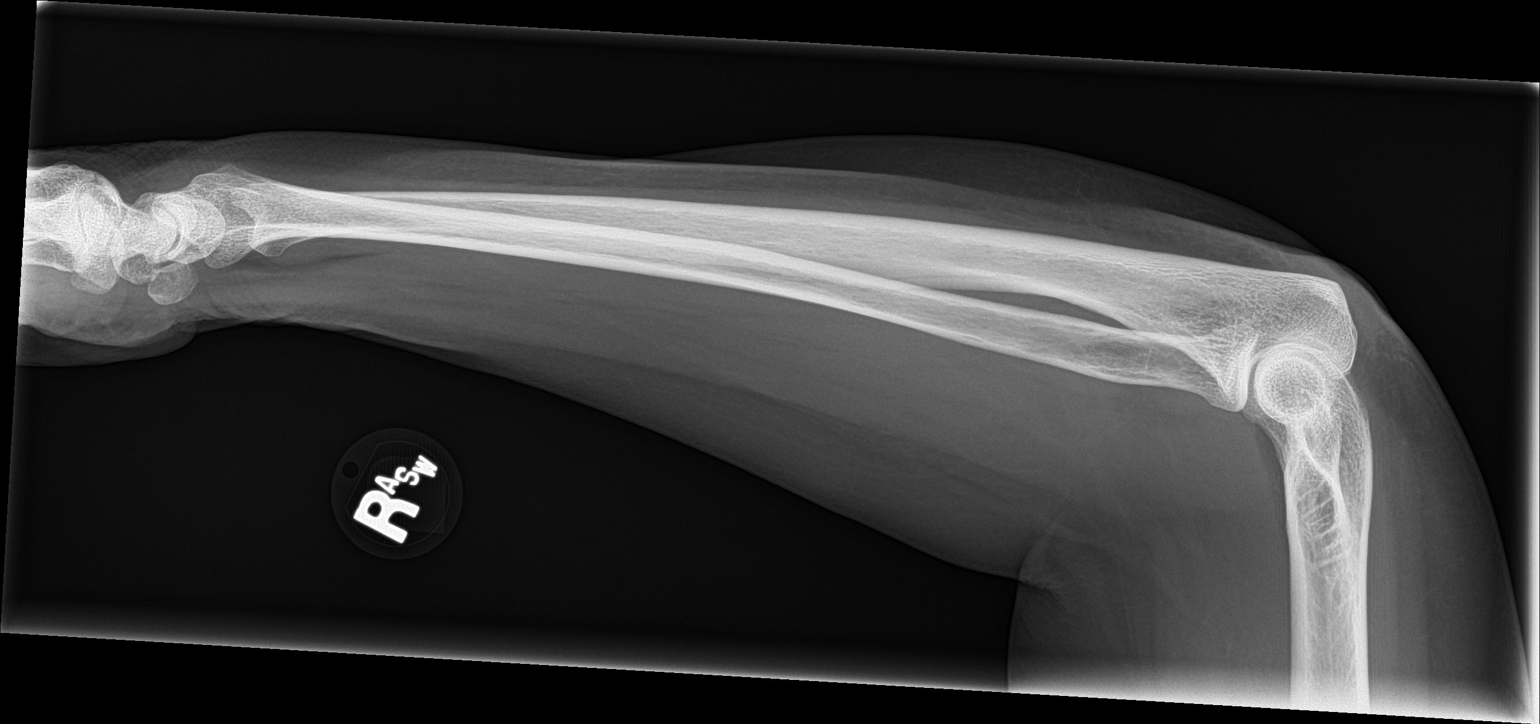

[2 of 2 positions shown; findings below may reference images not displayed]

FINDINGS: On the AP view, there may be a slight bump in the region of the
patient's symptoms. However, no suspicious masses identified. The
adjacent bones are normal.
IMPRESSION: No suspicious masses.  The bones are normal.

## 2017-04-15 NOTE — ED Triage Notes (Signed)
Mass and skin discoloration to right forearm, onset today. Denies injury.

## 2017-04-28 NOTE — Telephone Encounter (Signed)
Error

## 2017-05-07 ENCOUNTER — Other Ambulatory Visit: Payer: Self-pay | Admitting: Internal Medicine

## 2017-05-07 DIAGNOSIS — Z1231 Encounter for screening mammogram for malignant neoplasm of breast: Secondary | ICD-10-CM

## 2017-05-08 ENCOUNTER — Telehealth: Payer: Self-pay | Admitting: *Deleted

## 2017-05-08 ENCOUNTER — Other Ambulatory Visit: Payer: Self-pay | Admitting: Internal Medicine

## 2017-05-08 DIAGNOSIS — R32 Unspecified urinary incontinence: Secondary | ICD-10-CM

## 2017-05-08 NOTE — Progress Notes (Signed)
Order placed for physical therapy referral for pelvic floor therapy.

## 2017-05-08 NOTE — Telephone Encounter (Signed)
Order placed for referral.  

## 2017-05-08 NOTE — Telephone Encounter (Signed)
Pt called back returning your call. Please advise, tahnk you!  Call pt @ [phone]

## 2017-05-08 NOTE — Telephone Encounter (Signed)
Not able to leave v/m mail box full.

## 2017-05-08 NOTE — Telephone Encounter (Signed)
Pt is requesting a referral to Dr. Annamaria Boots for pelvic floor therapy Pt contact [phone]

## 2017-05-08 NOTE — Telephone Encounter (Signed)
We sent referral in December but patient had started new deductible they have reached it for this year and called to make app. Was told that referral is only good for 35months and will need new one sent.

## 2017-05-09 ENCOUNTER — Ambulatory Visit: Payer: BLUE CROSS/BLUE SHIELD | Admitting: Pulmonary Disease

## 2017-05-09 ENCOUNTER — Encounter: Payer: Self-pay | Admitting: Pulmonary Disease

## 2017-05-09 ENCOUNTER — Ambulatory Visit (INDEPENDENT_AMBULATORY_CARE_PROVIDER_SITE_OTHER): Payer: BLUE CROSS/BLUE SHIELD | Admitting: Pulmonary Disease

## 2017-05-09 VITALS — BP 118/74 | HR 113 | Ht 67.0 in | Wt 172.0 lb

## 2017-05-09 DIAGNOSIS — R0600 Dyspnea, unspecified: Secondary | ICD-10-CM

## 2017-05-09 DIAGNOSIS — R0609 Other forms of dyspnea: Secondary | ICD-10-CM | POA: Diagnosis not present

## 2017-05-09 NOTE — Progress Notes (Signed)
PULMONARY OFFICE FOLLOW UP NOTE  Requesting MD/Service: Einar Pheasant, MD Date of initial consultation: 04/09/16 Reason for consultation: Dyspnea  PT PROFILE: 34 F never smoker referred for evaluation of episodic mild dyspnea X 8-9 months. Suspected asthma. Initial Plan: trial of ICS/LABA.   DATA: CT chest 11/29/13: minimal bi-apical scarring CXR 12/16/15: [facility] PFTs 05/16/16: normal spirometry, normal volumes, normal DLCO  SUBJ:  Last seen 04/2016. Was started on Breo for possible asthma. She only used for 2 weeks and felt that it was possibly beneficial but now is not convinced that it helped at all. She has an albuterol MID which she almost never uses. She continues to report dyspnea but has great difficulty discerning dyspnea from generalized fatigue. She is trying to exercise (walking) and is working on weight loss. No new complaints. Denies CP, fever, purulent sputum, hemoptysis, LE edema and calf tenderness.  Vitals:   05/09/17 1517 05/09/17 1521  BP:  118/74  Pulse:  (!) 113  SpO2:  97%  Weight: 78 kg (172 lb)   Height: 5\' 7"  (1.702 m)      EXAM:  Gen: WDWN, NAD HEENT: WNL Neck: Supple without JVD Lungs: breath sounds full without wheezes Cardiovascular: RRR, no murmurs noted Abdomen: Moderately obese, soft, nontender, normal BS Ext: without clubbing, cyanosis, edema Neuro: grossly intact  DATA:  CXR 12/12/16: [facility]  IMPRESSION:      ICD-10-CM   1. Dyspnea on exertion R06.09    It is not clear how much she is really experiencing dyspnea (vs generalized fatigue) and it now seems doubtful that she has asthma. The only mildly worrisome finding on exam is relative tachycardia (though, notably, vitals were obtained immediately after ambulating to the exam room). She has undergone a complete cardiac evaluation in the recent past (fath).   PLAN:  1) OK to remain off of Breo 2) She may "experiment" with albuterol - using it just prior to walking to see if it  improves her exercise tolerance 3) F/U as needed   Merton Border, MD P[facility] service Mobile [phone] Pager [phone] 05/09/2017 3:59 PM

## 2017-05-09 NOTE — Patient Instructions (Signed)
You may remain off of Breo inhaler  You may try the albuterol inhaler - one to two inhalations - prior to walking or other exercise. Continue if you thinks it helps your breathing. Stop if you can't tell a difference  Follow up as needed

## 2017-05-21 ENCOUNTER — Ambulatory Visit
Admission: RE | Admit: 2017-05-21 | Discharge: 2017-05-21 | Disposition: A | Discharge status: Home / Self Care | Payer: BLUE CROSS/BLUE SHIELD | Source: Ambulatory Visit | Attending: Internal Medicine | Admitting: Internal Medicine

## 2017-05-21 DIAGNOSIS — Z1231 Encounter for screening mammogram for malignant neoplasm of breast: Secondary | ICD-10-CM | POA: Diagnosis present

## 2017-05-21 IMAGING — MG MM DIGITAL SCREENING BILAT W/ TOMO W/ CAD
9 of 12 series · 9 of 28 positions shown · non-contrast
Comparison: Previous exam(s).

CLINICAL DATA: Screening.

EXAM:
2D DIGITAL SCREENING BILATERAL MAMMOGRAM WITH CAD AND ADJUNCT TOMO

[L CC synth-2D]
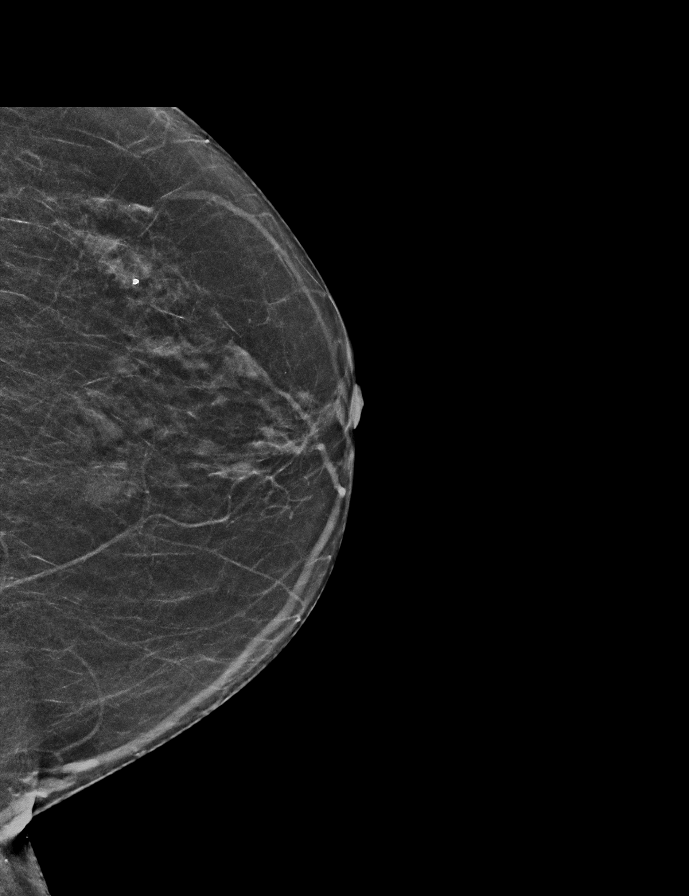

[L CC]
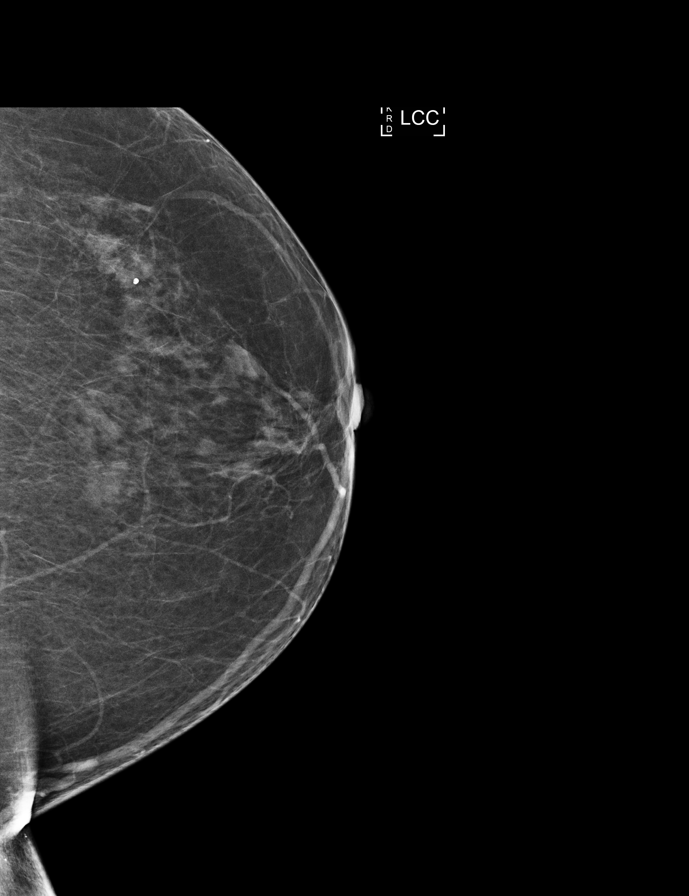

[R MLO]
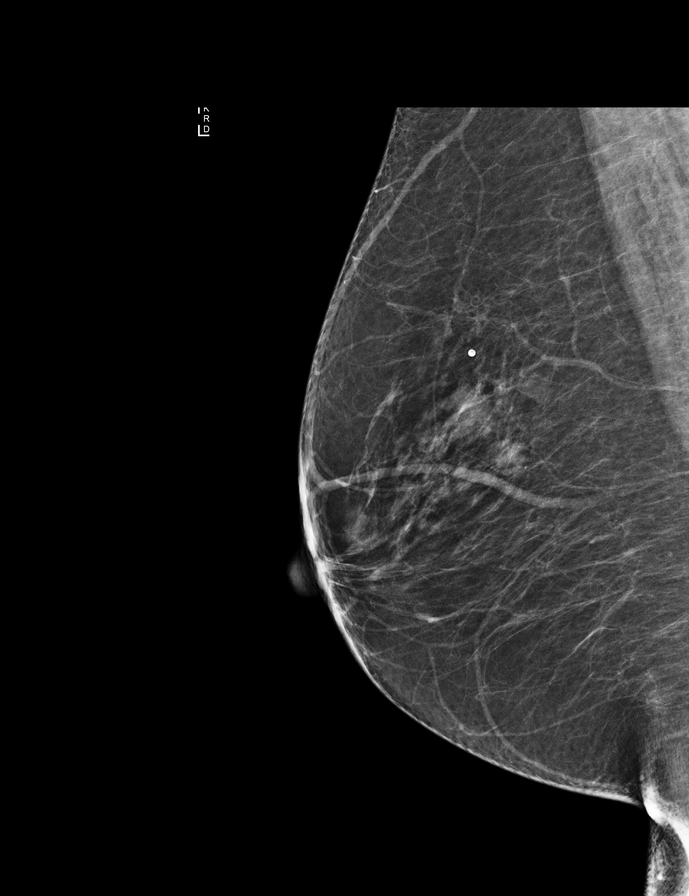

[R CC]
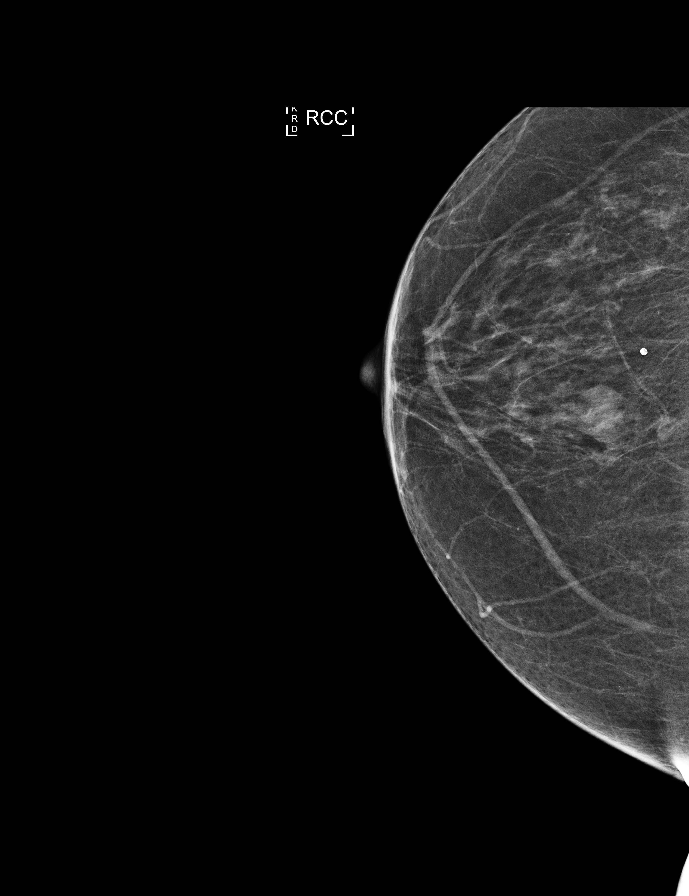

[L MLO]
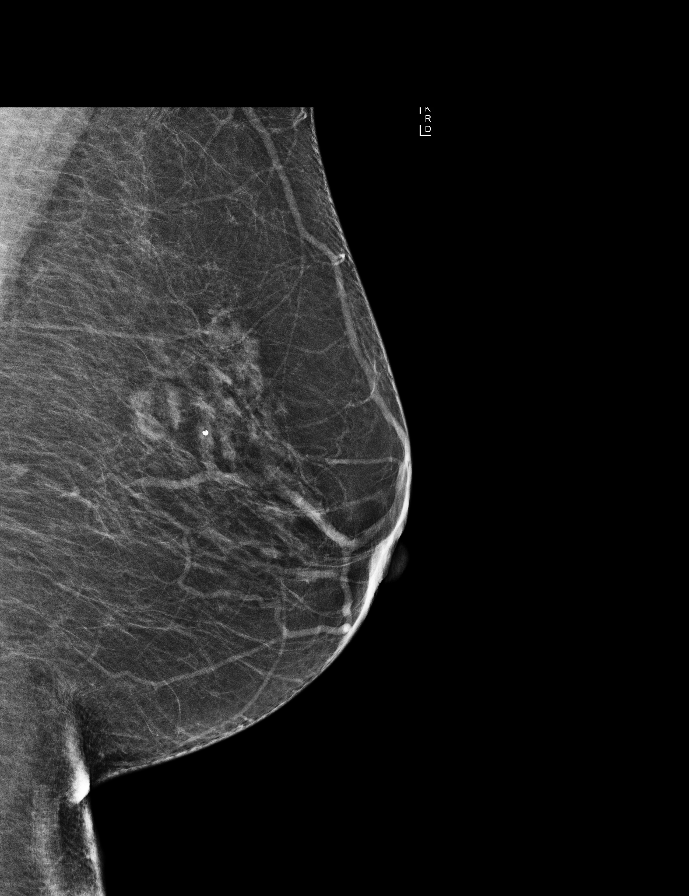

[L MLO synth-2D]
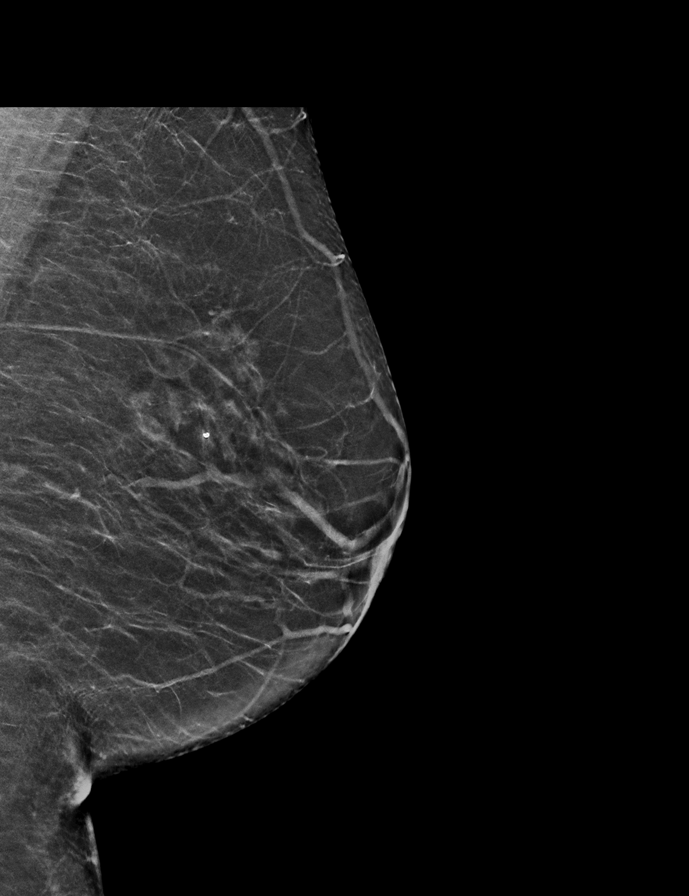

[R MLO synth-2D]
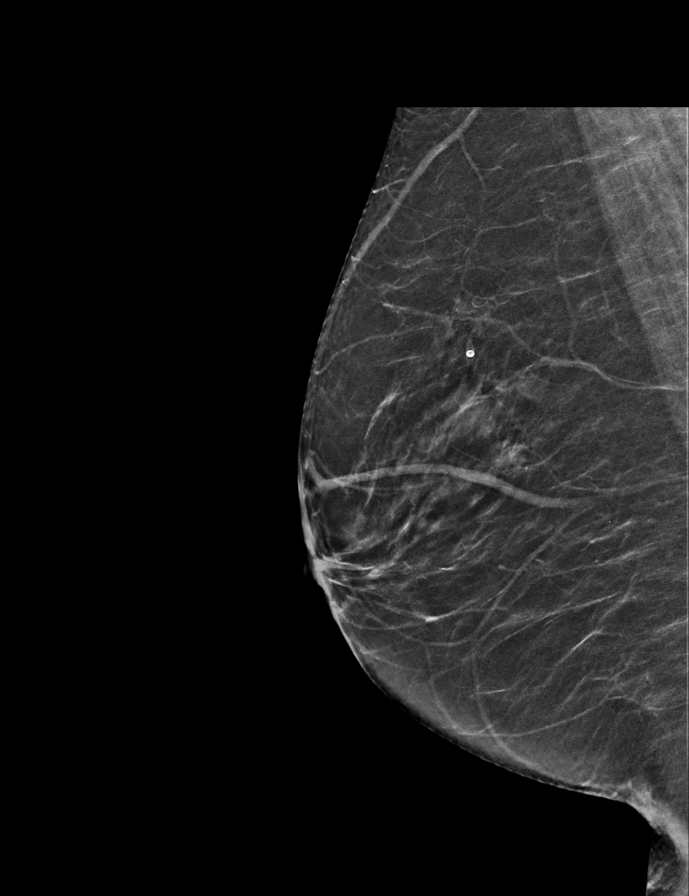

[R CC synth-2D]
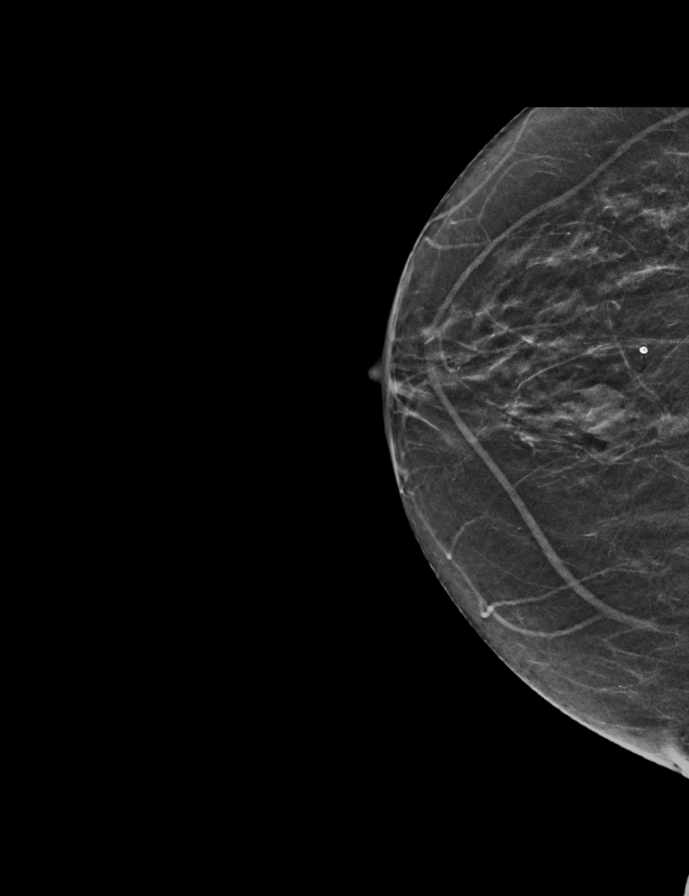

[L MLO tomo · tomo slice 27/54.0]
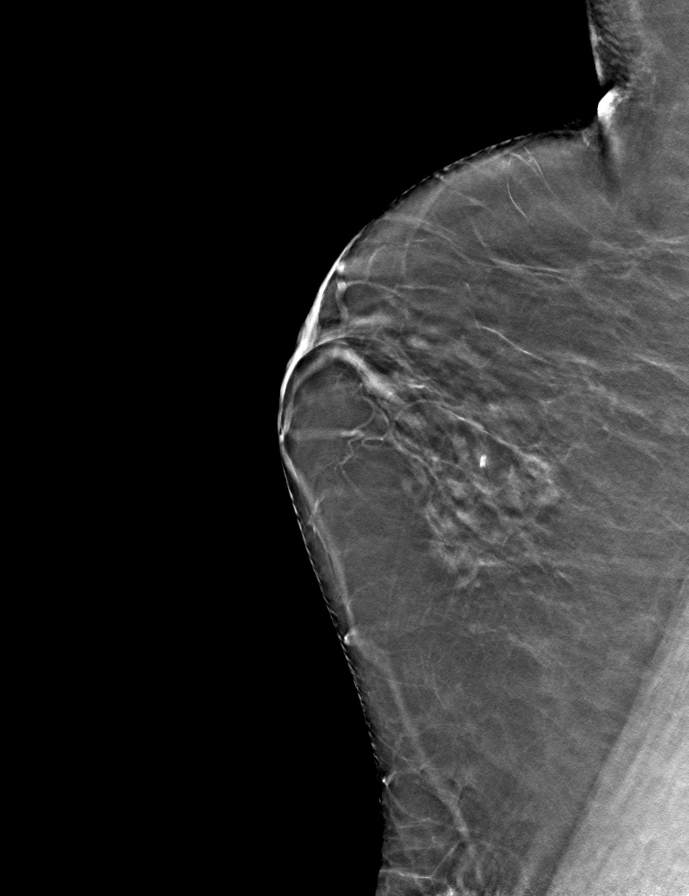

[9 of 28 positions shown; findings below may reference images not displayed]

ACR Breast Density Category b: There are scattered areas of
fibroglandular density.
FINDINGS: There are no findings suspicious for malignancy. Images were
processed with CAD.
IMPRESSION: No mammographic evidence of malignancy. A result letter of this
screening mammogram will be mailed directly to the patient.

RECOMMENDATION:
Screening mammogram in one year. (Code:[33])

BI-RADS CATEGORY  1: Negative.

## 2017-05-28 ENCOUNTER — Telehealth: Payer: Self-pay | Admitting: Internal Medicine

## 2017-05-28 NOTE — Telephone Encounter (Signed)
Pt called wanting to get her mammogram results Please advise

## 2017-05-28 NOTE — Telephone Encounter (Signed)
Called patient found letter from [place] with results and read it to her. She did not not have any questions.

## 2017-06-26 ENCOUNTER — Telehealth: Payer: Self-pay

## 2017-06-26 NOTE — Telephone Encounter (Signed)
Copied from [place] [id number]. Topic: Quick Communication - See Telephone Encounter >> Jun 26, 2017  9:08 AM Percell Belt A wrote: CRM for notification. See Telephone encounter for:  pt is wanting to go over to [facility] to get labs done.  She is going to call and find out if they can use the orders in the system.  If not she may need a written script for the labs. And she would like to know if she can add the TSH lab for the thyroid.   Best number [phone]   06/26/17.

## 2017-06-26 NOTE — Telephone Encounter (Signed)
Patient is aware and stated she would call her doctor at [facility].

## 2017-06-26 NOTE — Telephone Encounter (Signed)
I assume she is talking about [facility].  I do not mind her getting labs there, but in order to get labs drawn there, she would need to have a doctor there to order (or ok orders).  I cannot order the labs to be drawn there and she cannot just take rx in there and have drawn.  One of there doctors has to order.

## 2017-06-26 NOTE — Telephone Encounter (Signed)
FYI

## 2017-06-30 ENCOUNTER — Other Ambulatory Visit: Payer: Self-pay

## 2017-06-30 ENCOUNTER — Encounter: Payer: Self-pay | Admitting: Physical Therapy

## 2017-06-30 ENCOUNTER — Ambulatory Visit: Payer: BLUE CROSS/BLUE SHIELD | Attending: Internal Medicine | Admitting: Physical Therapy

## 2017-06-30 DIAGNOSIS — M4125 Other idiopathic scoliosis, thoracolumbar region: Secondary | ICD-10-CM

## 2017-06-30 DIAGNOSIS — M25551 Pain in right hip: Secondary | ICD-10-CM | POA: Diagnosis present

## 2017-06-30 DIAGNOSIS — M25561 Pain in right knee: Secondary | ICD-10-CM

## 2017-06-30 DIAGNOSIS — M533 Sacrococcygeal disorders, not elsewhere classified: Secondary | ICD-10-CM | POA: Diagnosis present

## 2017-06-30 DIAGNOSIS — R278 Other lack of coordination: Secondary | ICD-10-CM | POA: Insufficient documentation

## 2017-06-30 DIAGNOSIS — M25562 Pain in left knee: Secondary | ICD-10-CM | POA: Insufficient documentation

## 2017-06-30 NOTE — Patient Instructions (Signed)
Wear shoe lift in L shoe     Proper body mechanics with getting out of a chair to decrease strain  on back &pelvic floor   Avoid holding your breath when Getting out of the chair:  Scoot to front part of chair chair Heels behind feet, feet are hip width apart, nose over toes  Inhale like you are smelling roses Exhale to stand    ____________                                                  Lynn Mann the function of your pelvic floor, abdomen, and back.              Avoid decreased straining of abdominal/pelvic floor muscles with less              slouching,  holding your breath with lifting/bowel movements)                                                     FUNCTIONAL POSTURES

## 2017-07-01 NOTE — Therapy (Signed)
[place] MAIN [facility] SERVICES [place], [place], [address] Phone: [phone]   Fax:  [phone]  Physical Therapy Evaluation  Patient Details  Name: Lynn Mann MRN: [id number] Date of Birth: 17-Apr-1951 Referring Provider: Dr.Scott   Encounter Date: 06/30/2017    Past Medical History:  Diagnosis Date  . Arthritis   . Chicken pox   . Cholecystitis 11/2011   Did not require sgy - Dr. Staci Acosta - [facility]  (cholelithiasis)  . H/O Clostridium difficile infection   . IBS (irritable bowel syndrome)   . MRSA exposure 2005   Spider bite  . MVP (mitral valve prolapse)    Stable - Dr. Ubaldo Glassing  . Rheumatic fever     Past Surgical History:  Procedure Laterality Date  . CATARACT EXTRACTION  [id number]  . MUSCLE BIOPSY      There were no vitals filed for this visit.   Subjective Assessment - 07/01/17 1839    Subjective  1) Fecal leakage:  Pt reported she had fecal leakage since she was in her 12s. It is not a consist thing. She is not able to make it to the bathroom after eating out. While pt typically has loose stools ( [place] Stool Type 5) & IBS, pt reports she recently has been more constipated lately the last 2-3 weeks. Normally bowel movements 3x /day.  Currently she has difficulty with eliminating. She feels there is something blocking it. She has to shift in to a certain position to help with elimination.  This type of stool is Type 4 but is thin. Last week, it was fat but it hurt "tremendously" to get out.     2) Urinary leakage:  Pt used to leave a trail when she gets up at night to go use the bathroom. Pt also had the urge to go once she crosses a line located on her garage floor and has leakage 100% of the time.  Pt wears Depends which has been helped her to feel more comfortable.    3) R hip pain for the past 1 week, pain level 4-5/10 and eases with rest. When walking, the R hip pain  stops her. There is a catch but no  radiating pain.   4) R medial knee pain 3/10 has been bothering her for a couple weeks.    She is still able to walking 2 miles with her husband daily but she does not stretch afterwards. There are no hills on her walking route.  Scoliosis without treatments. Pt has lost 25 lbs across the past 6 months and is eating better and walking.       Pertinent History  Gynecological Hx: 2 vaginal deliveries with epiosiotomies with both.  Pt reports she forms keloid scars but she is not sure if she has formed them at these scars. Pt will have her next gynecological exam with her PCP who is her referring provider. Cardiac conditions: for the past 1.5 year, she is being monitored because she has passed out.  Her cardiac work-up had negative findings. Pt has mitral valve prolapse.      Patient Stated Goals   stop wearing Depends         [facility] PT Assessment - 07/01/17 1840      Assessment   Medical Diagnosis  Urinary incontinence     Referring Provider  Dr.Scott      Precautions   Precautions  None      Restrictions   Weight  Bearing Restrictions  No      Balance Screen   Has the patient fallen in the past 6 months  No      Observation/Other Assessments   Observations  L shoulder higher       Coordination   Gross Motor Movements are Fluid and Coordinated  -- chest breathing/ abdominal straining, unable to achieve diap    Fine Motor Movements are Fluid and Coordinated  -- adductor/ abdominal overuse w/ cue for pelvic floor contract      Sit to Stand   Comments  genu valgus, diffiulty with alignment cues,       Palpation   Spinal mobility  R thoracolumbar scoliosis, increased thoracic mm tensions/hypomobility    Palpation comment  R ASIS more inferior/ anterior, L malleoli lower than R,  93.5cm R, L 93 cm (med malleoli to ASIS)       Bed Mobility   Bed Mobility  -- bearing down w/ out of bed              Objective measurements completed on examination: See above findings.     Pelvic Floor Special Questions - 07/01/17 1843    Pelvic Floor Internal Exam  to be assessed at next session       [facility] Adult PT Treatment/Exercise - 07/01/17 1846      Neuro Re-ed    Neuro Re-ed Details   ese pt instructions. fitted for shoe lift in  L shoe       Manual Therapy   Manual therapy comments  long axis distraction BLE,  AP mob at R hip FADDIR/ER, rotational mob               PT Education - 07/01/17 1848    Education provided  Yes    Education Details  POC, anatomy/ physiology, HEP, goals, methods to minmize strian onto pelvic floor     Person(s) Educated  Patient    Methods  Explanation;Demonstration;Tactile cues;Verbal cues;Handout    Comprehension  Returned demonstration;Verbalized understanding          PT Long Term Goals - 06/30/17 1047      PT LONG TERM GOAL #1   Title  Pt will be able to get out of a chair with proper mechanics without using hands on chair rests  5 reps and also with 10 lb weight in her hands 5 reps in order to carry her grand baby.     Time  12    Period  Weeks    Status  New    Target Date  09/22/17      PT LONG TERM GOAL #2   Title  Pt will be decrease Depends pad wear from 3/ day to < 1 day in order to travel with her family     Time  8    Period  Weeks    Status  New    Target Date  08/25/17      PT LONG TERM GOAL #3   Title  Pt will demo increased diaphragmatic excursion without abdominal straining in order to progress to deep core coordination strengthening exercises    Time  2    Period  Weeks    Status  New    Target Date  07/15/17      PT LONG TERM GOAL #4   Title  Pt will demo decreased score on PFDI score % to < % in order to restore pelvic floor function    Time  12    Period  Weeks    Status  New      PT LONG TERM GOAL #5   Title  Pt will increase her LEFS score from % to % in order to have less knee pain to perform her hobbies and ADLs    Time  12    Period  Weeks    Status  New    Target Date   09/23/17             Plan - 06/30/17 1109    Clinical Impression Statement Pt is a 66 yo female who complains of urinary and fecal incontinence in combination with R hip and knee pain. These deficits impact her ADLs and QOL. Pt's clinical presentations include scoliosis with associated pelvic obliquities, leg length discrepancy,  dyscoordination and strength of pelvic floor mm, and poor body mechanics which places strain on the abdominal/pelvic floor mm. These are deficits that indicate an ineffective intraabdominal pressure system associated with incontinence. Addressed pt's leg length difference today by providing a shoe lift into L shoe and applied manual Tx to correct pelvic obliquities and hypomobility.Suspect pt's R hip/knee pain are likely associated with her scoliosis and leg length discrepancy.  Pt showed a more symmetrically aligned pelvis in stance and supine in addition to improved hip/ SIJ ROM following Tx. Pt tolerated today's Tx without complaints.   Clinical Presentation  Evolving    Clinical Decision Making  High    PT Frequency  2x / week    PT Duration  12 weeks    PT Treatment/Interventions  Therapeutic exercise;Patient/family education;Therapeutic activities;Balance training;Moist Heat;Neuromuscular re-education;Manual techniques;Scar mobilization;Manual lymph drainage;Taping;Gait training;Stair training;Functional mobility training    Consulted and Agree with Plan of Care  Patient       Patient will benefit from skilled therapeutic intervention in order to improve the following deficits and impairments:  Abnormal gait, Pain, Improper body mechanics, Increased fascial restricitons, Increased muscle spasms, Hypermobility, Decreased scar mobility, Decreased coordination, Postural dysfunction, Decreased range of motion, Decreased endurance, Decreased activity tolerance, Decreased strength, Difficulty walking, Decreased safety awareness, Impaired flexibility, Decreased  mobility  Visit Diagnosis: Sacrococcygeal disorders, not elsewhere classified  Other idiopathic scoliosis, thoracolumbar region  Pain in right hip  Right knee pain, unspecified chronicity     Problem List Patient Active Problem List   Diagnosis Date Noted  . Asthma 07/16/2016  . Urinary incontinence 07/16/2016  . Dizziness 04/01/2016  . SOB (shortness of breath) 04/01/2016  . Bilateral shoulder pain 02/24/2016  . Fatigue 01/28/2016  . Neck fullness 01/28/2016  . Routine general medical examination at a health care facility 07/25/2015  . Health care maintenance 10/09/2014  . Foot pain 06/19/2014  . Diarrhea 04/08/2014  . Hyperglycemia 02/07/2014  . Neck pain 11/26/2013  . Hypercholesterolemia 11/26/2013  . History of colonic polyps 05/11/2013  . Hyperbilirubinemia 01/18/2013  . GERD (gastroesophageal reflux disease) 12/03/2012  . Pneumonia 11/27/2012  . Cholelithiasis 11/07/2012  . IBS (irritable bowel syndrome) 11/07/2012  . History of rheumatic fever 08/28/2012  . MVP (mitral valve prolapse) 08/28/2012    Jerl Mina ,PT, DPT, E-RYT  07/01/2017, 7:10 PM  [place] MAIN [facility] SERVICES [address] [place], [place], [address] Phone: [phone]   Fax:  [phone]  Name: Lynn Mann MRN: [id number] Date of Birth: March 16, 1951

## 2017-07-02 ENCOUNTER — Ambulatory Visit: Payer: BLUE CROSS/BLUE SHIELD | Admitting: Physical Therapy

## 2017-07-02 DIAGNOSIS — M533 Sacrococcygeal disorders, not elsewhere classified: Secondary | ICD-10-CM | POA: Diagnosis not present

## 2017-07-02 DIAGNOSIS — M25551 Pain in right hip: Secondary | ICD-10-CM

## 2017-07-02 DIAGNOSIS — M4125 Other idiopathic scoliosis, thoracolumbar region: Secondary | ICD-10-CM

## 2017-07-02 DIAGNOSIS — M25561 Pain in right knee: Secondary | ICD-10-CM

## 2017-07-02 NOTE — Therapy (Signed)
[place] MAIN [facility] SERVICES [address] [place], [place], [address] Phone: [phone]   Fax:  [phone]  Physical Therapy Treatment  Patient Details  Name: Lynn Mann MRN: [id number] Date of Birth: 1951-02-12 Referring Provider: Dr.Scott   Encounter Date: 07/02/2017  PT End of Session - 07/02/17 1123    Visit Number  2    Number of Visits  12    Date for PT Re-Evaluation  09/22/17    PT Start Time  0915    PT Stop Time  1005    PT Time Calculation (min)  50 min    Equipment Utilized During Treatment  --    Activity Tolerance  No increased pain;Patient tolerated treatment well    Behavior During Therapy  [facility] for tasks assessed/performed       Past Medical History:  Diagnosis Date  . Arthritis   . Chicken pox   . Cholecystitis 11/2011   Did not require sgy - Dr. Staci Acosta - [facility]  (cholelithiasis)  . H/O Clostridium difficile infection   . IBS (irritable bowel syndrome)   . MRSA exposure 2005   Spider bite  . MVP (mitral valve prolapse)    Stable - Dr. Ubaldo Glassing  . Rheumatic fever     Past Surgical History:  Procedure Laterality Date  . CATARACT EXTRACTION  [id number]  . MUSCLE BIOPSY      There were no vitals filed for this visit.  Subjective Assessment - 07/02/17 0917    Subjective  Pt felt good after leaving the session. Pt worked out in her yard last night and her hip did not hurt until the night.     Pertinent History  Scoliosis without treatments. Pt has lost 25 lbs across the past 6 months and is eating better and walking. Gynecological Hx: 2 vaginal deliveries with epiosiotomies with both.  Pt reports she forms keloid scars but she is not sure if she has formed them at these scars. Pt will have her next physical with PCP, referring provider. Medical issues that is being monitored the past 1.5 year: pt passed out and her cardiac work-up had negative findings. Pt has mitral valve prolapse.      Patient Stated Goals   love to stop wearing Depends,          [facility] PT Assessment - 07/02/17 0957      Observation/Other Assessments   Observations  reported pain in her R neck with cervical rotation in open book ( no pain with less trunk /cervical rotation)       Coordination   Gross Motor Movements are Fluid and Coordinated  -- achieved diaphragmatic expansion post Tx in seated/ sudelyin      Posture/Postural Control   Posture Comments  more upright posture post Tx      Strength   Overall Strength Comments  hip abd 3-/5 B      Palpation   Palpation comment  interspinal mm tighter on L > R.  MOre upper trap overuse on R with open book exercise.                Pelvic Floor Special Questions - 07/01/17 1843    Pelvic Floor Internal Exam  to be assessed at next session        [facility] Adult PT Treatment/Exercise - 07/02/17 0958      Exercises   Exercises  -- see pt instructions      Manual Therapy   Manual  therapy comments  STM at interspinal mm B, levator/upper trap on R              PT Education - 07/02/17 1006    Education provided  Yes    Education Details  HEP    Person(s) Educated  Patient    Methods  Explanation;Demonstration;Tactile cues;Verbal cues;Handout    Comprehension  Returned demonstration;Verbalized understanding          PT Long Term Goals - 06/30/17 1047      PT LONG TERM GOAL #1   Title  Pt will be able to get out of a chair with proper mechanics without using hands on chair rests  5 reps and also with 10 lb weight in her hands 5 reps in order to carry her grand baby.     Time  12    Period  Weeks    Status  New    Target Date  09/22/17      PT LONG TERM GOAL #2   Title  Pt will be decrease Depends pad wear from 3/ day to < 1 day in order to travel with her family     Time  8    Period  Weeks    Status  New    Target Date  08/25/17      PT LONG TERM GOAL #3   Title  Pt will demo increased diaphragmatic excursion without abdominal straining in  order to progress to deep core coordination strengthening exercises    Time  2    Period  Weeks    Status  New    Target Date  07/15/17      PT LONG TERM GOAL #4   Title  Pt will demo decreased score on PFDI score % to < % in order to restore pelvic floor function    Time  12    Period  Weeks    Status  New      PT LONG TERM GOAL #5   Title  Pt will increase her LEFS score from % to % in order to have less knee pain to perform her hobbies and ADLs    Time  12    Period  Weeks    Status  New    Target Date  09/23/17            Plan - 07/02/17 1124    Clinical Impression Statement  Pt demo'd good carry over with pelvic girdle alignment from last session. Addressed thoracic spine and released mm tensions at the convexity of her curve. Pt demo'd more scapular depression/retraction and less pper trap overuse/ chest breathing post Tx. Pt was able to sit more upright with less forward head and slumped posture.  Plan to assess pelvic floor and progress with strengthening exercises related to scoliosis. Plan to discuss about shoe wear as pt wears tennis shoes with poor sole support.  Pt continues to benefit from skilled PT.     PT Frequency  2x / week    PT Duration  12 weeks    PT Treatment/Interventions  Therapeutic exercise;Patient/family education;Therapeutic activities;Balance training;Moist Heat;Neuromuscular re-education;Manual techniques;Scar mobilization;Manual lymph drainage;Taping;Gait training;Stair training;Functional mobility training    Consulted and Agree with Plan of Care  Patient       Patient will benefit from skilled therapeutic intervention in order to improve the following deficits and impairments:  Abnormal gait, Pain, Improper body mechanics, Increased fascial restricitons, Increased muscle spasms, Hypermobility, Decreased scar mobility, Decreased coordination,  Postural dysfunction, Decreased range of motion, Decreased endurance, Decreased activity tolerance,  Decreased strength, Difficulty walking, Decreased safety awareness, Impaired flexibility, Decreased mobility  Visit Diagnosis: Sacrococcygeal disorders, not elsewhere classified  Other idiopathic scoliosis, thoracolumbar region  Pain in right hip  Right knee pain, unspecified chronicity     Problem List Patient Active Problem List   Diagnosis Date Noted  . Asthma 07/16/2016  . Urinary incontinence 07/16/2016  . Dizziness 04/01/2016  . SOB (shortness of breath) 04/01/2016  . Bilateral shoulder pain 02/24/2016  . Fatigue 01/28/2016  . Neck fullness 01/28/2016  . Routine general medical examination at a health care facility 07/25/2015  . Health care maintenance 10/09/2014  . Foot pain 06/19/2014  . Diarrhea 04/08/2014  . Hyperglycemia 02/07/2014  . Neck pain 11/26/2013  . Hypercholesterolemia 11/26/2013  . History of colonic polyps 05/11/2013  . Hyperbilirubinemia 01/18/2013  . GERD (gastroesophageal reflux disease) 12/03/2012  . Pneumonia 11/27/2012  . Cholelithiasis 11/07/2012  . IBS (irritable bowel syndrome) 11/07/2012  . History of rheumatic fever 08/28/2012  . MVP (mitral valve prolapse) 08/28/2012    Jerl Mina ,PT, DPT, E-RYT  07/02/2017, 1:53 PM  [place] MAIN [facility] SERVICES [address] [place], [place], [address] Phone: [phone]   Fax:  [phone]  Name: ARMONIE METTLER MRN: [id number] Date of Birth: Sep 27, 1950

## 2017-07-02 NOTE — Patient Instructions (Addendum)
Increase midback mobility:  Open book ( 1/4 turn) without holding shoulder up to ears    15 reps each side  If painful with slight neck turn, turn less or stop   _______  Addressing upper scoliosis curve :  shoulder roll backwards both    L elbow up and back down  10reps    ________   Strengthening gluts:   Clam Shell 45 Degrees   Lying with hips and knees bent 45, one pillow between knees and ankles. Lift knee with exhale. Be sure pelvis does not roll backward. Do not arch back. Do 10 times, each leg, 2 times per day.  http://ss.exer.us/75   Copyright  VHI. All rights reserved.    ___

## 2017-07-07 ENCOUNTER — Encounter: Payer: BLUE CROSS/BLUE SHIELD | Admitting: Physical Therapy

## 2017-07-08 ENCOUNTER — Ambulatory Visit: Payer: BLUE CROSS/BLUE SHIELD | Admitting: Physical Therapy

## 2017-07-08 DIAGNOSIS — M25561 Pain in right knee: Secondary | ICD-10-CM

## 2017-07-08 DIAGNOSIS — M533 Sacrococcygeal disorders, not elsewhere classified: Secondary | ICD-10-CM | POA: Diagnosis not present

## 2017-07-08 DIAGNOSIS — M25551 Pain in right hip: Secondary | ICD-10-CM

## 2017-07-08 DIAGNOSIS — M4125 Other idiopathic scoliosis, thoracolumbar region: Secondary | ICD-10-CM

## 2017-07-08 NOTE — Patient Instructions (Addendum)
Stretch for pelvic floor   1) "v heels slide away and then back toward buttocks and then rock knee to slight ,  slide heel along at 11 o clock away from buttocks   10 reps     2) "butterfly stretch with knees upon pillows  Heels together 5 mins with breathing coordination     3) Pelvic tilt with breathing 10 reps  _________  Out of chair mechanics to correct not having knees together    alignment of knees  And toes , wider than hips    Scoot to front part of chair chair Heels behind feet, feet are hip width apart, nose over toes  Inhale like you are smelling roses Exhale to stand   _________   Mini squats 10 reps , 3 x day   Heels behind feet, feet are hip width apart

## 2017-07-09 NOTE — Therapy (Signed)
[place] MAIN [facility] SERVICES [address] [place], [place], [address] Phone: [phone]   Fax:  [phone]  Physical Therapy Treatment  Patient Details  Name: Lynn Mann MRN: [id number] Date of Birth: 08/21/1950 Referring Provider: Dr.Scott   Encounter Date: 07/08/2017  PT End of Session - 07/09/17 1529    Visit Number  3    Number of Visits  12    Date for PT Re-Evaluation  09/22/17    PT Start Time  1402    PT Stop Time  1515    PT Time Calculation (min)  73 min    Activity Tolerance  No increased pain;Patient tolerated treatment well    Behavior During Therapy  [facility] for tasks assessed/performed       Past Medical History:  Diagnosis Date  . Arthritis   . Chicken pox   . Cholecystitis 11/2011   Did not require sgy - Dr. Staci Acosta - [facility]  (cholelithiasis)  . H/O Clostridium difficile infection   . IBS (irritable bowel syndrome)   . MRSA exposure 2005   Spider bite  . MVP (mitral valve prolapse)    Stable - Dr. Ubaldo Glassing  . Rheumatic fever     Past Surgical History:  Procedure Laterality Date  . CATARACT EXTRACTION  [id number]  . MUSCLE BIOPSY      There were no vitals filed for this visit.  Subjective Assessment - 07/08/17 1410    Subjective  Pt reported she felt fatigue in the legs and pelvis. Pt can distinguish between muscle fatigue and tireness versus the fatigue that she felt when she had rheumatic fever 15-18 years ago. Pt reported she felt she recovered her mm strength after the fever after completing physical therapy.  Pt reports her R LBP has improved by 50% and she is able to get out of chair better. The pain now occurs when she starts out walking.    Pertinent History  Scoliosis without treatments. Pt has lost 25 lbs across the past 6 months and is eating better and walking. Gynecological Hx: 2 vaginal deliveries with epiosotomies with both.  Pt reports she forms keloid scars but she is not sure if she has formed  them at these scars. Pt will have her next physical with PCP, referring provider. Medical issues that is being monitored the past 1.5 year: pt passed out and her cardiac work-up had negative findings. Pt has mitral valve prolapse.      Patient Stated Goals  love to stop wearing Depends,                    Pelvic Floor Special Questions - 07/09/17 1528    Prolapse  Posterior Wall within introitus, behind ischial spine    Pelvic Floor Internal Exam  Pt consented verbally without contraindications after being explained the details of the exam       Exam Type  Vaginal    Palpation  reported burning at 3 o'clock, 5 o' clock , scar restrictions at 2nd layer, post Tx, less burning, only remaining burning in the deeper layer towards rectum     Strength  good squeeze, good lift, able to hold agaisnt strong resistance      Assessment: genu valgus with out of chair   [facility] Adult PT Treatment/Exercise - 07/09/17 1529      Neuro Re-ed    Neuro Re-ed Details   corodinated breathing, hip ROM for perineal scar releases, see pt instructions  Moist Heat Therapy   Moist Heat Location  Other (comment) double clothed, perineal       Manual Therapy   Internal Pelvic Floor  fascial releases at perineal scar 1-2nd layer 3-9 oclock              PT Education - 07/09/17 1529    Education provided  Yes    Education Details  HEP    Person(s) Educated  Patient    Methods  Explanation;Demonstration;Tactile cues;Verbal cues;Handout    Comprehension  Returned demonstration;Verbalized understanding          PT Long Term Goals - 06/30/17 1047      PT LONG TERM GOAL #1   Title  Pt will be able to get out of a chair with proper mechanics without using hands on chair rests  5 reps and also with 10 lb weight in her hands 5 reps in order to carry her grand baby.     Time  12    Period  Weeks    Status  New    Target Date  09/22/17      PT LONG TERM GOAL #2   Title  Pt will be decrease  Depends pad wear from 3/ day to < 1 day in order to travel with her family     Time  8    Period  Weeks    Status  New    Target Date  08/25/17      PT LONG TERM GOAL #3   Title  Pt will demo increased diaphragmatic excursion without abdominal straining in order to progress to deep core coordination strengthening exercises    Time  2    Period  Weeks    Status  New    Target Date  07/15/17      PT LONG TERM GOAL #4   Title  Pt will demo decreased score on PFDI score % to < % in order to restore pelvic floor function    Time  12    Period  Weeks    Status  New      PT LONG TERM GOAL #5   Title  Pt will increase her LEFS score from % to % in order to have less knee pain to perform her hobbies and ADLs    Time  12    Period  Weeks    Status  New    Target Date  09/23/17            Plan - 07/09/17 1530    Clinical Impression Statement  Pt demo'd decreased perineal scar restrictions following intravaginal manual Tx. Pt demo'd improved pelvic floor lengthening. Added pelvic floor stretches and hip strengthening exercises. Pt tolerated today's Tx without complaints. Pt continues to benefits from skilled PT.    PT Frequency  2x / week    PT Duration  12 weeks    PT Treatment/Interventions  Therapeutic exercise;Patient/family education;Therapeutic activities;Balance training;Moist Heat;Neuromuscular re-education;Manual techniques;Scar mobilization;Manual lymph drainage;Taping;Gait training;Stair training;Functional mobility training    Consulted and Agree with Plan of Care  Patient       Patient will benefit from skilled therapeutic intervention in order to improve the following deficits and impairments:  Abnormal gait, Pain, Improper body mechanics, Increased fascial restricitons, Increased muscle spasms, Hypermobility, Decreased scar mobility, Decreased coordination, Postural dysfunction, Decreased range of motion, Decreased endurance, Decreased activity tolerance, Decreased  strength, Difficulty walking, Decreased safety awareness, Impaired flexibility, Decreased mobility  Visit Diagnosis: Sacrococcygeal disorders, not  elsewhere classified  Other idiopathic scoliosis, thoracolumbar region  Pain in right hip  Right knee pain, unspecified chronicity     Problem List Patient Active Problem List   Diagnosis Date Noted  . Asthma 07/16/2016  . Urinary incontinence 07/16/2016  . Dizziness 04/01/2016  . SOB (shortness of breath) 04/01/2016  . Bilateral shoulder pain 02/24/2016  . Fatigue 01/28/2016  . Neck fullness 01/28/2016  . Routine general medical examination at a health care facility 07/25/2015  . Health care maintenance 10/09/2014  . Foot pain 06/19/2014  . Diarrhea 04/08/2014  . Hyperglycemia 02/07/2014  . Neck pain 11/26/2013  . Hypercholesterolemia 11/26/2013  . History of colonic polyps 05/11/2013  . Hyperbilirubinemia 01/18/2013  . GERD (gastroesophageal reflux disease) 12/03/2012  . Pneumonia 11/27/2012  . Cholelithiasis 11/07/2012  . IBS (irritable bowel syndrome) 11/07/2012  . History of rheumatic fever 08/28/2012  . MVP (mitral valve prolapse) 08/28/2012    Jerl Mina ,PT, DPT, E-RYT  07/09/2017, 3:34 PM  [place] MAIN [facility] SERVICES [address] [place], [place], [address] Phone: [phone]   Fax:  [phone]  Name: Lynn Mann MRN: [id number] Date of Birth: 05-23-1951

## 2017-07-14 ENCOUNTER — Ambulatory Visit: Payer: BLUE CROSS/BLUE SHIELD | Admitting: Physical Therapy

## 2017-07-14 DIAGNOSIS — M533 Sacrococcygeal disorders, not elsewhere classified: Secondary | ICD-10-CM

## 2017-07-14 DIAGNOSIS — M4125 Other idiopathic scoliosis, thoracolumbar region: Secondary | ICD-10-CM

## 2017-07-14 DIAGNOSIS — M25551 Pain in right hip: Secondary | ICD-10-CM

## 2017-07-14 DIAGNOSIS — M25561 Pain in right knee: Secondary | ICD-10-CM

## 2017-07-14 NOTE — Patient Instructions (Signed)
Gentle pelvic tilts with breathing   Deep core level 1 and 2

## 2017-07-14 NOTE — Therapy (Signed)
[place] MAIN [facility] SERVICES [address] [place], [place], [address] Phone: [phone]   Fax:  [phone]  Physical Therapy Treatment  Patient Details  Name: Lynn Mann MRN: [id number] Date of Birth: 09/21/1950 Referring Provider: Dr.Scott   Encounter Date: 07/14/2017  PT End of Session - 07/14/17 1306    Visit Number  4    Number of Visits  12    Date for PT Re-Evaluation  09/22/17    PT Start Time  1000    PT Stop Time  1100    PT Time Calculation (min)  60 min    Activity Tolerance  No increased pain;Patient tolerated treatment well    Behavior During Therapy  [facility] for tasks assessed/performed       Past Medical History:  Diagnosis Date  . Arthritis   . Chicken pox   . Cholecystitis 11/2011   Did not require sgy - Dr. Staci Acosta - [facility]  (cholelithiasis)  . H/O Clostridium difficile infection   . IBS (irritable bowel syndrome)   . MRSA exposure 2005   Spider bite  . MVP (mitral valve prolapse)    Stable - Dr. Ubaldo Glassing  . Rheumatic fever     Past Surgical History:  Procedure Laterality Date  . CATARACT EXTRACTION  [id number]  . MUSCLE BIOPSY      There were no vitals filed for this visit.  Subjective Assessment - 07/14/17 1011    Subjective  Pt reported she felt sore after last session but she felt better. Pt notices she is able to get tot eh bathroom most of the time when she gets up from a chair. Pt still has some accidents but less and not not has routinely.     Pertinent History  Scoliosis without treatments. Pt has lost 25 lbs across the past 6 months and is eating better and walking. Gynecological Hx: 2 vaginal deliveries with epiosotomies with both.  Pt reports she forms keloid scars but she is not sure if she has formed them at these scars. Pt will have her next physical with PCP, referring provider. Medical issues that is being monitored the past 1.5 year: pt passed out and her cardiac work-up had negative findings.  Pt has mitral valve prolapse.      Patient Stated Goals  love to stop wearing Depends,          [facility] PT Assessment - 07/14/17 1302      Coordination   Gross Motor Movements are Fluid and Coordinated  -- able to self correct when chest breathing                Pelvic Floor Special Questions - 07/14/17 1012    Prolapse  None    Pelvic Floor Internal Exam  Pt consented verbally without contraindications after being explained the details of the exam       Exam Type  Vaginal    Palpation  scar restrictions at 6 clock 5-6 o clock 2-3rd layer    Strength  good squeeze, good lift, able to hold agaisnt strong resistance        [facility] Adult PT Treatment/Exercise - 07/14/17 1303      Neuro Re-ed    Neuro Re-ed Details   see pt instructions       Manual Therapy   Internal Pelvic Floor  fascial releases at perineal 2-3rd layer 5-6 o clock  PT Education - 07/14/17 1303    Education provided  Yes    Education Details  HEP    Person(s) Educated  Patient    Methods  Explanation;Demonstration;Tactile cues;Verbal cues;Handout    Comprehension  Returned demonstration;Verbalized understanding          PT Long Term Goals - 07/14/17 1459      PT LONG TERM GOAL #1   Title  Pt will be able to get out of a chair with proper mechanics without using hands on chair rests  5 reps and also with 10 lb weight in her hands 5 reps in order to carry her grand baby.     Time  12    Period  Weeks    Status  Partially Met      PT LONG TERM GOAL #2   Title  Pt will be decrease Depends pad wear from 3/ day to < 1 day in order to travel with her family     Time  8    Period  Weeks    Status  On-going      PT LONG TERM GOAL #3   Title  Pt will demo increased diaphragmatic excursion without abdominal straining in order to progress to deep core coordination strengthening exercises    Time  2    Period  Weeks    Status  Achieved      PT LONG TERM GOAL #4   Title  Pt will  demo decreased score on PFDI score 44 % to <39 % in order to restore pelvic floor function    Time  12    Period  Weeks    Status  On-going      PT LONG TERM GOAL #5   Title  Pt will increase her LEFS score from 39/80 pts to > 50/80 pts in order to have less knee pain to perform her hobbies and ADLs    Time  12    Period  Weeks    Status  On-going            Plan - 07/14/17 1306    Clinical Impression Statement  Pt showed signficantly decreased perineal scar restrictions and was able to faciliate proper pelvic floor ROM. Pt was advanced to deep core strengthening. Pt continues to benefit from skilled PT.     PT Frequency  2x / week    PT Duration  12 weeks    PT Treatment/Interventions  Therapeutic exercise;Patient/family education;Therapeutic activities;Balance training;Moist Heat;Neuromuscular re-education;Manual techniques;Scar mobilization;Manual lymph drainage;Taping;Gait training;Stair training;Functional mobility training    Consulted and Agree with Plan of Care  Patient       Patient will benefit from skilled therapeutic intervention in order to improve the following deficits and impairments:  Abnormal gait, Pain, Improper body mechanics, Increased fascial restricitons, Increased muscle spasms, Hypermobility, Decreased scar mobility, Decreased coordination, Postural dysfunction, Decreased range of motion, Decreased endurance, Decreased activity tolerance, Decreased strength, Difficulty walking, Decreased safety awareness, Impaired flexibility, Decreased mobility  Visit Diagnosis: Sacrococcygeal disorders, not elsewhere classified  Other idiopathic scoliosis, thoracolumbar region  Pain in right hip  Right knee pain, unspecified chronicity     Problem List Patient Active Problem List   Diagnosis Date Noted  . Asthma 07/16/2016  . Urinary incontinence 07/16/2016  . Dizziness 04/01/2016  . SOB (shortness of breath) 04/01/2016  . Bilateral shoulder pain 02/24/2016   . Fatigue 01/28/2016  . Neck fullness 01/28/2016  . Routine general medical examination at a health care  facility 07/25/2015  . Health care maintenance 10/09/2014  . Foot pain 06/19/2014  . Diarrhea 04/08/2014  . Hyperglycemia 02/07/2014  . Neck pain 11/26/2013  . Hypercholesterolemia 11/26/2013  . History of colonic polyps 05/11/2013  . Hyperbilirubinemia 01/18/2013  . GERD (gastroesophageal reflux disease) 12/03/2012  . Pneumonia 11/27/2012  . Cholelithiasis 11/07/2012  . IBS (irritable bowel syndrome) 11/07/2012  . History of rheumatic fever 08/28/2012  . MVP (mitral valve prolapse) 08/28/2012    Jerl Mina ,PT, DPT, E-RYT  07/14/2017, 3:01 PM  [place] MAIN [facility] SERVICES [address] [place], [place], [address] Phone: [phone]   Fax:  [phone]  Name: NATALYAH CUMMISKEY MRN: [id number] Date of Birth: 1951/03/24

## 2017-07-15 ENCOUNTER — Other Ambulatory Visit (INDEPENDENT_AMBULATORY_CARE_PROVIDER_SITE_OTHER): Payer: BLUE CROSS/BLUE SHIELD

## 2017-07-15 DIAGNOSIS — J4520 Mild intermittent asthma, uncomplicated: Secondary | ICD-10-CM

## 2017-07-15 DIAGNOSIS — E7800 Pure hypercholesterolemia, unspecified: Secondary | ICD-10-CM

## 2017-07-15 LAB — HEMOGLOBIN A1C: Hgb A1c MFr Bld: 5.4 % (ref 4.6–6.5)

## 2017-07-15 LAB — HEPATIC FUNCTION PANEL
ALT: 12 U/L (ref 0–35)
AST: 19 U/L (ref 0–37)
Albumin: 4.1 g/dL (ref 3.5–5.2)
Alkaline Phosphatase: 50 U/L (ref 39–117)
Bilirubin, Direct: 0.2 mg/dL (ref 0.0–0.3)
Total Bilirubin: 1.4 mg/dL — ABNORMAL HIGH (ref 0.2–1.2)
Total Protein: 7 g/dL (ref 6.0–8.3)

## 2017-07-15 LAB — BASIC METABOLIC PANEL
BUN: 12 mg/dL (ref 6–23)
CO2: 25 mEq/L (ref 19–32)
Calcium: 9.2 mg/dL (ref 8.4–10.5)
Chloride: 103 mEq/L (ref 96–112)
Creatinine, Ser: 0.62 mg/dL (ref 0.40–1.20)
GFR: 102.73 mL/min (ref >60.00)
Glucose, Bld: 66 mg/dL — ABNORMAL LOW (ref 70–99)
Potassium: 4.3 mEq/L (ref 3.5–5.1)
Sodium: 139 mEq/L (ref 135–145)

## 2017-07-15 LAB — LIPID PANEL
Cholesterol: 187 mg/dL (ref 0–200)
HDL: 39.9 mg/dL (ref >39.00)
LDL Cholesterol: 128 mg/dL — ABNORMAL HIGH (ref 0–99)
NonHDL: 147.28
Total CHOL/HDL Ratio: 5
Triglycerides: 95 mg/dL (ref 0.0–149.0)
VLDL: 19 mg/dL (ref 0.0–40.0)

## 2017-07-16 ENCOUNTER — Ambulatory Visit: Payer: BLUE CROSS/BLUE SHIELD | Admitting: Physical Therapy

## 2017-07-16 DIAGNOSIS — M25551 Pain in right hip: Secondary | ICD-10-CM

## 2017-07-16 DIAGNOSIS — M533 Sacrococcygeal disorders, not elsewhere classified: Secondary | ICD-10-CM | POA: Diagnosis not present

## 2017-07-16 DIAGNOSIS — M25561 Pain in right knee: Secondary | ICD-10-CM

## 2017-07-16 DIAGNOSIS — M4125 Other idiopathic scoliosis, thoracolumbar region: Secondary | ICD-10-CM

## 2017-07-16 NOTE — Patient Instructions (Addendum)
Clam shells ( see picture but do them differently:  on forearms, keeping side trunk lengthening)    Clam Shell 45 Degrees   Lying with hips and knees bent 45, one pillow between knees and ankles. Lift knee with exhale. Be sure pelvis does not roll backward. Do not arch back. Do 10 times, each leg, 2 times per day.  http://ss.exer.us/75   Copyright  VHI. All rights reserved.   __________  Seated:  green band at thigh,  Knees and feet always hip width apart  Inhale, Exhale pull knees apart   Return knees to hip width   10 x 2 reps  __________    Standing:  10 reps on both sides x 3 x day     3 point tap   Feet are hip width Tap forward, center under hip not feet next to each other  Tap middle\, center  Tap back      _________  Complimentary stretch:  _Figure-4  " man cross legs"  5 breaths    ________  Mini squats with hands at counter   Minisquat: Scoot buttocks back slight, hinge like you are looking at your reflection on a pond  Knees behind toes,  Inhale to "smell flowers"  Exhale on the rise "like rocket"  Do not lock knees, have more weight across ballmounds of feet, toes relaxed   10 reps x 3 x day    ______   Proper body mechanics with getting out of a chair to decrease strain  on back &pelvic floor   Avoid holding your breath when Getting out of the chair:  Scoot to front part of chair chair Heels behind feet, feet are hip width apart, nose over toes  Inhale like you are smelling roses Exhale to stand

## 2017-07-16 NOTE — Therapy (Signed)
[place] MAIN [facility] SERVICES [address] [place], [place], [address] Phone: [phone]   Fax:  [phone]  Physical Therapy Treatment  Patient Details  Name: Lynn Mann MRN: [id number] Date of Birth: 06/13/1951 Referring Provider: Dr.Scott   Encounter Date: 07/16/2017  PT End of Session - 07/16/17 1444    Visit Number  5    Number of Visits  12    Date for PT Re-Evaluation  09/22/17    PT Start Time  1884    PT Stop Time  1660    PT Time Calculation (min)  48 min    Activity Tolerance  No increased pain;Patient tolerated treatment well    Behavior During Therapy  [facility] for tasks assessed/performed       Past Medical History:  Diagnosis Date  . Arthritis   . Chicken pox   . Cholecystitis 11/2011   Did not require sgy - Dr. Staci Acosta - [facility]  (cholelithiasis)  . H/O Clostridium difficile infection   . IBS (irritable bowel syndrome)   . MRSA exposure 2005   Spider bite  . MVP (mitral valve prolapse)    Stable - Dr. Ubaldo Glassing  . Rheumatic fever     Past Surgical History:  Procedure Laterality Date  . CATARACT EXTRACTION  [id number]  . MUSCLE BIOPSY      There were no vitals filed for this visit.  Subjective Assessment - 07/16/17 1413    Subjective  Pt reported no difficulty with HEP from last session.     Pertinent History  Scoliosis without treatments. Pt has lost 25 lbs across the past 6 months and is eating better and walking. Gynecological Hx: 2 vaginal deliveries with epiosotomies with both.  Pt reports she forms keloid scars but she is not sure if she has formed them at these scars. Pt will have her next physical with PCP, referring provider. Medical issues that is being monitored the past 1.5 year: pt passed out and her cardiac work-up had negative findings. Pt has mitral valve prolapse.      Patient Stated Goals  love to stop wearing Depends,          [facility] PT Assessment - 07/16/17 1441      Strength   Overall  Strength Comments  hip ext 3/5L, 2/5 R                   [facility] Adult PT Treatment/Exercise - 07/16/17 1447      Neuro Re-ed    Neuro Re-ed Details   see pt instructions       Exercises   Exercises  -- see pt instructions             PT Education - 07/16/17 1443    Education provided  Yes    Education Details  HEP    Person(s) Educated  Patient    Methods  Explanation;Demonstration;Tactile cues;Verbal cues;Handout    Comprehension  Returned demonstration;Verbalized understanding          PT Long Term Goals - 07/14/17 1459      PT LONG TERM GOAL #1   Title  Pt will be able to get out of a chair with proper mechanics without using hands on chair rests  5 reps and also with 10 lb weight in her hands 5 reps in order to carry her grand baby.     Time  12    Period  Weeks    Status  Partially Met      PT LONG TERM GOAL #2   Title  Pt will be decrease Depends pad wear from 3/ day to < 1 day in order to travel with her family     Time  8    Period  Weeks    Status  On-going      PT LONG TERM GOAL #3   Title  Pt will demo increased diaphragmatic excursion without abdominal straining in order to progress to deep core coordination strengthening exercises    Time  2    Period  Weeks    Status  Achieved      PT LONG TERM GOAL #4   Title  Pt will demo decreased score on PFDI score 44 % to <39 % in order to restore pelvic floor function    Time  12    Period  Weeks    Status  On-going      PT LONG TERM GOAL #5   Title  Pt will increase her LEFS score from 39/80 pts to > 50/80 pts in order to have less knee pain to perform her hobbies and ADLs    Time  12    Period  Weeks    Status  On-going            Plan - 07/16/17 1444    Clinical Impression Statement  Pt progressed to hip strengthening today but required downgrade from plantigrade on forearms on counter for hip extensions due to SOB. New modification was provided without difficulty.  Pt also was  trained on proper knee alignment to minimize genu valgus during sit-tostand  and hyperextension during stance.  Pt demo'd correctly.     PT Frequency  2x / week    PT Duration  12 weeks    PT Treatment/Interventions  Therapeutic exercise;Patient/family education;Therapeutic activities;Balance training;Moist Heat;Neuromuscular re-education;Manual techniques;Scar mobilization;Manual lymph drainage;Taping;Gait training;Stair training;Functional mobility training    Consulted and Agree with Plan of Care  Patient       Patient will benefit from skilled therapeutic intervention in order to improve the following deficits and impairments:  Abnormal gait, Pain, Improper body mechanics, Increased fascial restricitons, Increased muscle spasms, Hypermobility, Decreased scar mobility, Decreased coordination, Postural dysfunction, Decreased range of motion, Decreased endurance, Decreased activity tolerance, Decreased strength, Difficulty walking, Decreased safety awareness, Impaired flexibility, Decreased mobility  Visit Diagnosis: Sacrococcygeal disorders, not elsewhere classified  Other idiopathic scoliosis, thoracolumbar region  Pain in right hip  Right knee pain, unspecified chronicity     Problem List Patient Active Problem List   Diagnosis Date Noted  . Asthma 07/16/2016  . Urinary incontinence 07/16/2016  . Dizziness 04/01/2016  . SOB (shortness of breath) 04/01/2016  . Bilateral shoulder pain 02/24/2016  . Fatigue 01/28/2016  . Neck fullness 01/28/2016  . Routine general medical examination at a health care facility 07/25/2015  . Health care maintenance 10/09/2014  . Foot pain 06/19/2014  . Diarrhea 04/08/2014  . Hyperglycemia 02/07/2014  . Neck pain 11/26/2013  . Hypercholesterolemia 11/26/2013  . History of colonic polyps 05/11/2013  . Hyperbilirubinemia 01/18/2013  . GERD (gastroesophageal reflux disease) 12/03/2012  . Pneumonia 11/27/2012  . Cholelithiasis 11/07/2012  . IBS  (irritable bowel syndrome) 11/07/2012  . History of rheumatic fever 08/28/2012  . MVP (mitral valve prolapse) 08/28/2012    Jerl Mina ,PT, DPT, E-RYT  07/16/2017, 2:47 PM  [place] MAIN [facility] SERVICES [address] [place], [place], [address] Phone: [phone]  Fax:  [phone]  Name: Lynn Mann MRN: [id number] Date of Birth: September 13, 1950

## 2017-07-18 ENCOUNTER — Ambulatory Visit (INDEPENDENT_AMBULATORY_CARE_PROVIDER_SITE_OTHER): Payer: BLUE CROSS/BLUE SHIELD | Admitting: Internal Medicine

## 2017-07-18 ENCOUNTER — Encounter: Payer: Self-pay | Admitting: Internal Medicine

## 2017-07-18 VITALS — BP 116/82 | HR 61 | Temp 97.7°F | Ht 67.0 in | Wt 164.2 lb

## 2017-07-18 DIAGNOSIS — E7800 Pure hypercholesterolemia, unspecified: Secondary | ICD-10-CM

## 2017-07-18 DIAGNOSIS — L299 Pruritus, unspecified: Secondary | ICD-10-CM

## 2017-07-18 DIAGNOSIS — Z86010 Personal history of colonic polyps: Secondary | ICD-10-CM | POA: Diagnosis not present

## 2017-07-18 DIAGNOSIS — K589 Irritable bowel syndrome without diarrhea: Secondary | ICD-10-CM | POA: Diagnosis not present

## 2017-07-18 DIAGNOSIS — R739 Hyperglycemia, unspecified: Secondary | ICD-10-CM

## 2017-07-18 DIAGNOSIS — Z0000 Encounter for general adult medical examination without abnormal findings: Secondary | ICD-10-CM | POA: Diagnosis not present

## 2017-07-18 NOTE — Progress Notes (Signed)
Patient ID: Lynn Mann, female   DOB: 1951/07/05, 66 y.o.   MRN: [id number]   Subjective:    Patient ID: Lynn Mann, female    DOB: March 28, 1951, 66 y.o.   MRN: [id number]  HPI  Patient here for her physical exam.  Was evaluated by pulmonary 05/09/17 with concerns regarding DOE.  Initial concern regarding asthma.  Was placed on Breo.  Only used for a short period.  Reviewed the 04/2017 note.  States "now seems doubtful that she has asthma".  She is off inhalers now.  Is s/p cardiac w/up.  ETT stress test - ok.  See Dr Bethanne Ginger notes.  It appears that symptoms are stable.  Continue f/u with cardiology.  She had questions about diet.  Discussed [facility] referral.  She wants to check and see if her insurance will cover a nutritionist.  Has persistent itching and burning on her back.  Request referral to Dr Nehemiah Massed.  She also reports a change in her bowels.  Previously had issues with loose stool.  Now has more constipation.  Some hemorrhoid issues.  Last colonoscopy 2014.  Discussed diet adjustment and staying hydrated.  Discussed taking fiber/miralax to help keep bowels regulated.  Requested referral to GI - given bowel change.  Discussed her recent lab tests.  Discussed cholesterol.  Questions answered about her cholesterol levels.     Past Medical History:  Diagnosis Date  . Arthritis   . Chicken pox   . Cholecystitis 11/2011   Did not require sgy - Dr. Staci Acosta - [facility]  (cholelithiasis)  . H/O Clostridium difficile infection   . IBS (irritable bowel syndrome)   . MRSA exposure 2005   Spider bite  . MVP (mitral valve prolapse)    Stable - Dr. Ubaldo Glassing  . Rheumatic fever    Past Surgical History:  Procedure Laterality Date  . CATARACT EXTRACTION  [id number]  . MUSCLE BIOPSY     Family History  Problem Relation Age of Onset  . Arthritis Mother   . Stroke Mother   . Diabetes Mother   . Hypertension Mother   . Arthritis Father   . Stroke Father   . Diabetes Paternal Grandmother   .  Cancer Paternal Uncle        colon  . Heart disease Unknown        maternal and paternal side  . Breast cancer Paternal Aunt   . Breast cancer Cousin   . Breast cancer Cousin        female cousin   Social History   Socioeconomic History  . Marital status: Married    Spouse name: None  . Number of children: None  . Years of education: 6  . Highest education level: None  Social Needs  . Financial resource strain: None  . Food insecurity - worry: None  . Food insecurity - inability: None  . Transportation needs - medical: None  . Transportation needs - non-medical: None  Occupational History  . Occupation: Retired  Tobacco Use  . Smoking status: Never Smoker  . Smokeless tobacco: Never Used  Substance and Sexual Activity  . Alcohol use: Yes    Alcohol/week: 0.0 oz    Comment: Rarely  . Drug use: No  . Sexual activity: Yes    Partners: Male    Birth control/protection: Post-menopausal  Other Topics Concern  . None  Social History Narrative  . None    Outpatient Encounter Medications as of 07/18/2017  Medication Sig  .  fluticasone furoate-vilanterol (BREO ELLIPTA) 100-25 MCG/INH AEPB Inhale 1 puff into the lungs daily.  . sodium chloride (OCEAN) 0.65 % SOLN nasal spray Place 2 sprays into both nostrils every 2 (two) hours while awake.  . albuterol (PROVENTIL HFA;VENTOLIN HFA) 108 (90 Base) MCG/ACT inhaler Inhale 2 puffs into the lungs every 4 (four) hours as needed for wheezing or shortness of breath.  . fluticasone (FLONASE) 50 MCG/ACT nasal spray Place 1 spray into both nostrils 2 (two) times daily.   No facility-administered encounter medications on file as of 07/18/2017.     Review of Systems  Constitutional: Negative for appetite change and unexpected weight change.  HENT: Negative for congestion and sinus pressure.   Eyes: Negative for pain and visual disturbance.  Respiratory: Negative for cough and chest tightness.        Breathing stable.   Cardiovascular:  Negative for chest pain, palpitations and leg swelling.  Gastrointestinal: Positive for constipation. Negative for abdominal pain, diarrhea, nausea and vomiting.  Genitourinary: Negative for difficulty urinating and dysuria.  Musculoskeletal: Negative for back pain and joint swelling.  Skin: Negative for color change.       Itching and burning - back.    Neurological: Negative for dizziness, light-headedness and headaches.  Hematological: Negative for adenopathy. Does not bruise/bleed easily.  Psychiatric/Behavioral: Negative for agitation and dysphoric mood.       Objective:    Physical Exam  Constitutional: She is oriented to person, place, and time. She appears well-developed and well-nourished. No distress.  HENT:  Nose: Nose normal.  Mouth/Throat: Oropharynx is clear and moist.  Eyes: Right eye exhibits no discharge. Left eye exhibits no discharge. No scleral icterus.  Neck: Neck supple. No thyromegaly present.  Cardiovascular: Normal rate and regular rhythm.  Pulmonary/Chest: Breath sounds normal. No accessory muscle usage. No tachypnea. No respiratory distress. She has no decreased breath sounds. She has no wheezes. She has no rhonchi. Right breast exhibits no inverted nipple, no mass, no nipple discharge and no tenderness (no axillary adenopathy). Left breast exhibits no inverted nipple, no mass, no nipple discharge and no tenderness (no axilarry adenopathy).  Abdominal: Soft. Bowel sounds are normal. There is no tenderness.  Musculoskeletal: She exhibits no edema or tenderness.  Lymphadenopathy:    She has no cervical adenopathy.  Neurological: She is alert and oriented to person, place, and time.  Skin: Skin is warm. No rash noted. No erythema.  Psychiatric: She has a normal mood and affect. Her behavior is normal.    BP 116/82 (BP Location: Left Arm, Patient Position: Sitting, Cuff Size: Normal)   Pulse 61   Temp 97.7 F (36.5 C) (Oral)   Ht 5' 7" (1.702 m)   Wt 164 lb  3.2 oz (74.5 kg)   BMI 25.72 kg/m  Wt Readings from Last 3 Encounters:  07/18/17 164 lb 3.2 oz (74.5 kg)  05/09/17 172 lb (78 kg)  04/15/17 173 lb (78.5 kg)     Lab Results  Component Value Date   WBC 4.7 02/11/2017   HGB 14.3 02/11/2017   HCT 42.2 02/11/2017   PLT 277.0 02/11/2017   GLUCOSE 66 (L) 07/15/2017   CHOL 187 07/15/2017   TRIG 95.0 07/15/2017   HDL 39.90 07/15/2017   LDLDIRECT 110.0 02/11/2017   LDLCALC 128 (H) 07/15/2017   ALT 12 07/15/2017   AST 19 07/15/2017   NA 139 07/15/2017   K 4.3 07/15/2017   CL 103 07/15/2017   CREATININE 0.62 07/15/2017   BUN 12  07/15/2017   CO2 25 07/15/2017   TSH 3.77 02/11/2017   HGBA1C 5.4 07/15/2017    Mm Screening Breast Tomo Bilateral  Result Date: 05/22/2017 CLINICAL DATA:  Screening. EXAM: 2D DIGITAL SCREENING BILATERAL MAMMOGRAM WITH CAD AND ADJUNCT TOMO COMPARISON:  Previous exam(s). ACR Breast Density Category b: There are scattered areas of fibroglandular density. FINDINGS: There are no findings suspicious for malignancy. Images were processed with CAD. IMPRESSION: No mammographic evidence of malignancy. A result letter of this screening mammogram will be mailed directly to the patient. RECOMMENDATION: Screening mammogram in one year. (Code:SM-B-01Y) BI-RADS CATEGORY  1: Negative. Electronically Signed   By: Marin Olp M.D.   On: 05/22/2017 08:45       Assessment & Plan:   Problem List Items Addressed This Visit    Health care maintenance    Physical today 07/18/17.  PAP 07/15/16.  Mammogram 05/22/17 -birads I.   Colonoscopy 05/05/13.       History of colonic polyps    Colonoscopy 2014.  With bowel change as outlined, requested referral back to GI for question of need for f/u colonoscopy.  Last colonoscopy with one 31m polyp in the sigmoid colon, diverticulosis and internal hemorrhoids.        Relevant Orders   Ambulatory referral to Gastroenterology   Hyperbilirubinemia    Follow liver panel.         Relevant Orders   Hepatic function panel   Hypercholesterolemia    Discussed lab results.  Low cholesterol diet and exercise.  Follow lipid panel.        Relevant Orders   Lipid panel   Basic metabolic panel   Hyperglycemia    Low carb diet and exercise.  Follow met b and a1c.        Relevant Orders   Hemoglobin A1c   IBS (irritable bowel syndrome)    Has a diagnosis of IBS.  Previous loose stool.  Now with more constipation.  Discussed fiber.  Stay hydrated.  Last colonoscopy 2014.  Requested referral back to GI for question of need for f/u colonoscopy.        Relevant Orders   Ambulatory referral to Gastroenterology   Itching    Persistent itching as outlined.  She requested referral to Dr KNehemiah Massed        Relevant Orders   Ambulatory referral to Dermatology   Routine general medical examination at a health care facility - Primary       SEinar Pheasant MD

## 2017-07-18 NOTE — Assessment & Plan Note (Addendum)
Physical today 07/18/17.  PAP 07/15/16.  Mammogram 05/22/17 -birads I.   Colonoscopy 05/05/13.

## 2017-07-21 ENCOUNTER — Encounter: Payer: Self-pay | Admitting: Internal Medicine

## 2017-07-21 ENCOUNTER — Ambulatory Visit: Payer: BLUE CROSS/BLUE SHIELD | Admitting: Physical Therapy

## 2017-07-21 DIAGNOSIS — M25561 Pain in right knee: Secondary | ICD-10-CM

## 2017-07-21 DIAGNOSIS — M533 Sacrococcygeal disorders, not elsewhere classified: Secondary | ICD-10-CM | POA: Diagnosis not present

## 2017-07-21 DIAGNOSIS — M25551 Pain in right hip: Secondary | ICD-10-CM

## 2017-07-21 DIAGNOSIS — M4125 Other idiopathic scoliosis, thoracolumbar region: Secondary | ICD-10-CM

## 2017-07-21 DIAGNOSIS — L299 Pruritus, unspecified: Secondary | ICD-10-CM | POA: Insufficient documentation

## 2017-07-21 NOTE — Assessment & Plan Note (Signed)
Has a diagnosis of IBS.  Previous loose stool.  Now with more constipation.  Discussed fiber.  Stay hydrated.  Last colonoscopy 2014.  Requested referral back to GI for question of need for f/u colonoscopy.

## 2017-07-21 NOTE — Assessment & Plan Note (Signed)
Discussed lab results.  Low cholesterol diet and exercise.  Follow lipid panel.

## 2017-07-21 NOTE — Assessment & Plan Note (Signed)
Persistent itching as outlined.  She requested referral to Dr Nehemiah Massed.

## 2017-07-21 NOTE — Assessment & Plan Note (Signed)
Low carb diet and exercise.  Follow met b and a1c.   

## 2017-07-21 NOTE — Therapy (Signed)
[place] MAIN [facility] SERVICES [address] [place], [place], [address] Phone: [phone]   Fax:  [phone]  Physical Therapy Treatment  Patient Details  Name: Lynn Mann MRN: [id number] Date of Birth: 19-Sep-1950 Referring Provider: Dr.Scott   Encounter Date: 07/21/2017  PT End of Session - 07/21/17 1054    Visit Number  6    Number of Visits  12    Date for PT Re-Evaluation  09/22/17    PT Start Time  1008    PT Stop Time  1055    PT Time Calculation (min)  47 min    Activity Tolerance  No increased pain;Patient tolerated treatment well    Behavior During Therapy  [facility] for tasks assessed/performed       Past Medical History:  Diagnosis Date  . Arthritis   . Chicken pox   . Cholecystitis 11/2011   Did not require sgy - Dr. Staci Acosta - [facility]  (cholelithiasis)  . H/O Clostridium difficile infection   . IBS (irritable bowel syndrome)   . MRSA exposure 2005   Spider bite  . MVP (mitral valve prolapse)    Stable - Dr. Ubaldo Glassing  . Rheumatic fever     Past Surgical History:  Procedure Laterality Date  . CATARACT EXTRACTION  [id number]  . MUSCLE BIOPSY      There were no vitals filed for this visit.  Subjective Assessment - 07/21/17 1012    Subjective  Pt has noticed the improvements with the exercises.     Pertinent History  Scoliosis without treatments. Pt has lost 25 lbs across the past 6 months and is eating better and walking. Gynecological Hx: 2 vaginal deliveries with epiosotomies with both.  Pt reports she forms keloid scars but she is not sure if she has formed them at these scars. Pt will have her next physical with PCP, referring provider. Medical issues that is being monitored the past 1.5 year: pt passed out and her cardiac work-up had negative findings. Pt has mitral valve prolapse.      Patient Stated Goals  love to stop wearing Depends,          [facility] PT Assessment - 07/21/17 1050      Coordination   Gross Motor  Movements are Fluid and Coordinated  -- required cuing to correct chest breathing/ dyscoordination               Pelvic Floor Special Questions - 07/21/17 1046    Prolapse  None    Pelvic Floor Internal Exam  Pt consented verbally without contraindications after being explained the details of the exam       Exam Type  Vaginal    Palpation  scar restrictions at 7 o'clock 2-3rd layers,  2nd layer 8 and 4 o'clock     Strength  good squeeze, good lift, able to hold agaisnt strong resistance more circumferential and sequential movements        [facility] Adult PT Treatment/Exercise - 07/21/17 1051      Neuro Re-ed    Neuro Re-ed Details   see pt instructions       Moist Heat Therapy   Moist Heat Location  Other (comment) double clothed, perineal       Manual Therapy   Internal Pelvic Floor  fascial releases at perineal scars, location noted at assessment              PT Education - 07/21/17 1054  Education provided  Yes    Education Details  HEP    Person(s) Educated  Patient    Methods  Explanation;Demonstration;Tactile cues;Verbal cues;Handout    Comprehension  Verbalized understanding;Returned demonstration          PT Long Term Goals - 07/14/17 1459      PT LONG TERM GOAL #1   Title  Pt will be able to get out of a chair with proper mechanics without using hands on chair rests  5 reps and also with 10 lb weight in her hands 5 reps in order to carry her grand baby.     Time  12    Period  Weeks    Status  Partially Met      PT LONG TERM GOAL #2   Title  Pt will be decrease Depends pad wear from 3/ day to < 1 day in order to travel with her family     Time  8    Period  Weeks    Status  On-going      PT LONG TERM GOAL #3   Title  Pt will demo increased diaphragmatic excursion without abdominal straining in order to progress to deep core coordination strengthening exercises    Time  2    Period  Weeks    Status  Achieved      PT LONG TERM GOAL #4    Title  Pt will demo decreased score on PFDI score 44 % to <39 % in order to restore pelvic floor function    Time  12    Period  Weeks    Status  On-going      PT LONG TERM GOAL #5   Title  Pt will increase her LEFS score from 39/80 pts to > 50/80 pts in order to have less knee pain to perform her hobbies and ADLs    Time  12    Period  Weeks    Status  On-going            Plan - 07/21/17 1055    Clinical Impression Statement  Pt demo'd significantly decreased perineal scar restrictions and achieved a more circumferential and sequential movement/coordination of all pelvic floor layers of mm. Pt continues to benefit from skilled PT.     PT Frequency  2x / week    PT Duration  12 weeks    PT Treatment/Interventions  Therapeutic exercise;Patient/family education;Therapeutic activities;Balance training;Moist Heat;Neuromuscular re-education;Manual techniques;Scar mobilization;Manual lymph drainage;Taping;Gait training;Stair training;Functional mobility training    Consulted and Agree with Plan of Care  Patient       Patient will benefit from skilled therapeutic intervention in order to improve the following deficits and impairments:  Abnormal gait, Pain, Improper body mechanics, Increased fascial restricitons, Increased muscle spasms, Hypermobility, Decreased scar mobility, Decreased coordination, Postural dysfunction, Decreased range of motion, Decreased endurance, Decreased activity tolerance, Decreased strength, Difficulty walking, Decreased safety awareness, Impaired flexibility, Decreased mobility  Visit Diagnosis: Sacrococcygeal disorders, not elsewhere classified  Other idiopathic scoliosis, thoracolumbar region  Pain in right hip  Right knee pain, unspecified chronicity     Problem List Patient Active Problem List   Diagnosis Date Noted  . Itching 07/21/2017  . Asthma 07/16/2016  . Urinary incontinence 07/16/2016  . Dizziness 04/01/2016  . SOB (shortness of breath)  04/01/2016  . Bilateral shoulder pain 02/24/2016  . Fatigue 01/28/2016  . Neck fullness 01/28/2016  . Routine general medical examination at a health care facility 07/25/2015  . Health  care maintenance 10/09/2014  . Foot pain 06/19/2014  . Diarrhea 04/08/2014  . Hyperglycemia 02/07/2014  . Neck pain 11/26/2013  . Hypercholesterolemia 11/26/2013  . History of colonic polyps 05/11/2013  . Hyperbilirubinemia 01/18/2013  . GERD (gastroesophageal reflux disease) 12/03/2012  . Cholelithiasis 11/07/2012  . IBS (irritable bowel syndrome) 11/07/2012  . History of rheumatic fever 08/28/2012  . MVP (mitral valve prolapse) 08/28/2012    Jerl Mina ,PT, DPT, E-RYT  07/21/2017, 10:59 AM  [place] MAIN [facility] SERVICES [place], [place], [address] Phone: [phone]   Fax:  [phone]  Name: Lynn Mann MRN: [id number] Date of Birth: 1950-12-31

## 2017-07-21 NOTE — Patient Instructions (Signed)
Deep core level 1 refinement:  Inhale, pelvic floor lowers like a bowl  Exhale, it lifts like an umbrella  Quick squeeze at the end of exhale  10 reps  _____________

## 2017-07-21 NOTE — Assessment & Plan Note (Signed)
Colonoscopy 2014.  With bowel change as outlined, requested referral back to GI for question of need for f/u colonoscopy.  Last colonoscopy with one 60mm polyp in the sigmoid colon, diverticulosis and internal hemorrhoids.

## 2017-07-21 NOTE — Assessment & Plan Note (Signed)
Follow liver panel.  

## 2017-07-24 ENCOUNTER — Ambulatory Visit: Payer: BLUE CROSS/BLUE SHIELD | Admitting: Physical Therapy

## 2017-07-24 DIAGNOSIS — M533 Sacrococcygeal disorders, not elsewhere classified: Secondary | ICD-10-CM

## 2017-07-24 DIAGNOSIS — M25551 Pain in right hip: Secondary | ICD-10-CM

## 2017-07-24 DIAGNOSIS — M25561 Pain in right knee: Secondary | ICD-10-CM

## 2017-07-24 DIAGNOSIS — M4125 Other idiopathic scoliosis, thoracolumbar region: Secondary | ICD-10-CM

## 2017-07-24 NOTE — Therapy (Addendum)
[place] MAIN [facility] SERVICES [address] [place], [place], [address] Phone: [phone]   Fax:  [phone]  Physical Therapy Treatment /  Discharge Note   Patient Details  Name: Lynn Mann MRN: [id number] Date of Birth: Mar 28, 1951 Referring Provider: Dr.Scott   Encounter Date: 07/24/2017    Past Medical History:  Diagnosis Date  . Arthritis   . Chicken pox   . Cholecystitis 11/2011   Did not require sgy - Dr. Staci Acosta - [facility]  (cholelithiasis)  . H/O Clostridium difficile infection   . IBS (irritable bowel syndrome)   . MRSA exposure 2005   Spider bite  . MVP (mitral valve prolapse)    Stable - Dr. Ubaldo Glassing  . Rheumatic fever     Past Surgical History:  Procedure Laterality Date  . CATARACT EXTRACTION  [id number]  . MUSCLE BIOPSY      There were no vitals filed for this visit.  Subjective Assessment - 08/08/17 0823    Subjective  Pt reported she felt soreness for 2 days after last session    Pertinent History  Scoliosis without treatments. Pt has lost 25 lbs across the past 6 months and is eating better and walking. Gynecological Hx: 2 vaginal deliveries with epiosotomies with both.  Pt reports she forms keloid scars but she is not sure if she has formed them at these scars. Pt will have her next physical with PCP, referring provider. Medical issues that is being monitored the past 1.5 year: pt passed out and her cardiac work-up had negative findings. Pt has mitral valve prolapse.      Patient Stated Goals  love to stop wearing Depends,          [facility] PT Assessment - 08/08/17 0824      Observation/Other Assessments   Observations  cued for wider BOS in new exercises , and for more stabilization in distal joints                Pelvic Floor Special Questions - 07/24/17 0840    Prolapse  None     Pelvic Floor Internal Exam  Pt consented verbally without contraindications after being explained the details of the exam      (Pended)     Exam Type  Vaginal  (Pended)     Palpation  scar restrictions at 7 o'clock 2-3rd layers,  2nd layer 8 and 4 o'clock   (Pended)     Strength  good squeeze, good lift, able to hold agaisnt strong resistance  (Pended)  more circumferential and sequential movements        [facility] Adult PT Treatment/Exercise - 08/08/17 0824      Neuro Re-ed    Neuro Re-ed Details   see pt instructions       Manual Therapy   Internal Pelvic Floor  fascial releases at perineal scars, location noted at assessment                   PT Long Term Goals - 07/24/17 0819      PT LONG TERM GOAL #1   Title  Pt will be able to get out of a chair with proper mechanics without using hands on chair rests  5 reps and also with 10 lb weight in her hands 5 reps in order to carry her grand baby.     Time  12    Period  Weeks    Status  Achieved  PT LONG TERM GOAL #2   Title  Pt will be decrease Depends pad wear from 3/ day to < 1 day in order to travel with her family     Time  8    Period  Weeks    Status  Achieved      PT LONG TERM GOAL #3   Title  Pt will demo increased diaphragmatic excursion without abdominal straining in order to progress to deep core coordination strengthening exercises    Time  2    Period  Weeks    Status  Achieved      PT LONG TERM GOAL #4   Title  Pt will demo decreased score on PFDI score 44 % to <39 % in order to restore pelvic floor function  (12/27: 48%)    Time  12    Period  Weeks    Status  Not met      PT LONG TERM GOAL #5   Title  Pt will increase her LEFS score from 39/80 pts to > 50/80 pts in order to have less knee pain to perform her hobbies and ADLs  ( 12/27: 48/80)    Time  12    Period  Weeks    Status  Achieved            Plan - 08/08/17 0826    Clinical Impression Statement Pt has met 4/5 goals. Pt's scoliosis and leg length discrepancy have been addressed which has helped her Sx with pelvic floor, hip, and knee function. Pt  has demonstrated improved body mechanics and no longer strain her abdominal, pelvic floor mm, and knees .Pt showed more upright posture and improved postural strength with deep core mm.  Pt's hip strength and gait have improved and her pain has decreased significantly as indicated by the increase on her LEFS score. Although her PFDI score appears to show worsening of Sx with an increased score, pt reports of improved urinary/fecal leakage to PT verbally. Pt also demo'd decreased perineal scar restrictions, increased pelvic floor ROM, coordination, and strength. These deficits helped her to get out of a chair without leakage and will continue to help her to lift her grandchildren with less leakage. Pt was advanced to thoracolumbar /glut strengthening and voiced understanding on how to continue making improvements. Pt was limited in her number of visits due to insurance and financial coverage and therefore, was not able to continue with PT which would have addressed further on helping her to gain overall strength.  Pt is ready for d/c at this time. Thank you for your referral!     PT Frequency  2x / week    PT Duration  12 weeks    PT Treatment/Interventions  Therapeutic exercise;Patient/family education;Therapeutic activities;Balance training;Moist Heat;Neuromuscular re-education;Manual techniques;Scar mobilization;Manual lymph drainage;Taping;Gait training;Stair training;Functional mobility training    Consulted and Agree with Plan of Care  Patient       Patient will benefit from skilled therapeutic intervention in order to improve the following deficits and impairments:  Abnormal gait, Pain, Improper body mechanics, Increased fascial restricitons, Increased muscle spasms, Hypermobility, Decreased scar mobility, Decreased coordination, Postural dysfunction, Decreased range of motion, Decreased endurance, Decreased activity tolerance, Decreased strength, Difficulty walking, Decreased safety awareness,  Impaired flexibility, Decreased mobility  Visit Diagnosis: Other idiopathic scoliosis, thoracolumbar region  Sacrococcygeal disorders, not elsewhere classified  Pain in right hip  Right knee pain, unspecified chronicity     Problem List Patient Active Problem List   Diagnosis Date  Noted  . Itching 07/21/2017  . Asthma 07/16/2016  . Urinary incontinence 07/16/2016  . Dizziness 04/01/2016  . SOB (shortness of breath) 04/01/2016  . Bilateral shoulder pain 02/24/2016  . Fatigue 01/28/2016  . Neck fullness 01/28/2016  . Routine general medical examination at a health care facility 07/25/2015  . Health care maintenance 10/09/2014  . Foot pain 06/19/2014  . Diarrhea 04/08/2014  . Hyperglycemia 02/07/2014  . Neck pain 11/26/2013  . Hypercholesterolemia 11/26/2013  . History of colonic polyps 05/11/2013  . Hyperbilirubinemia 01/18/2013  . GERD (gastroesophageal reflux disease) 12/03/2012  . Cholelithiasis 11/07/2012  . IBS (irritable bowel syndrome) 11/07/2012  . History of rheumatic fever 08/28/2012  . MVP (mitral valve prolapse) 08/28/2012    Jerl Mina ,PT, DPT, E-RYT  08/08/2017, 8:27 AM  [place] MAIN [facility] SERVICES [address] [place], [place], [address] Phone: [phone]   Fax:  [phone]  Name: Lynn Mann MRN: [id number] Date of Birth: June 09, 1951

## 2017-07-31 ENCOUNTER — Ambulatory Visit: Payer: Self-pay | Admitting: *Deleted

## 2017-07-31 NOTE — Telephone Encounter (Signed)
Spoke with patient again and she had not been evaluated. Advised again that she should go to the ED or Urgent care/. Patient stated she would talk to her husband again.

## 2017-07-31 NOTE — Telephone Encounter (Signed)
fyi

## 2017-07-31 NOTE — Telephone Encounter (Signed)
Pt  Reports  Dizziness   Weakness    Lightheaded  For  approx  24  Hours . Has  Not  Fallen   Some  Nausea    Weak  And  Tired  No  Vomiting .    Pt  States  Husband    Has  Similar  Episode   As   Well pt  Advised  To  Proceed  To [facility]    AND  NOT TO  DRIVE . Pt  Was  Advised  To  Ambulate slowly  And  To  Be  Carefull  .  Pt  Advised  Her  Husband  May  Need  To  Be  Seen as  Well .  Reason for Disposition . [1] Dizziness (vertigo) present now AND [2] age > 82  (Exception: prior physician evaluation for this AND no different/worse than usual)  Answer Assessment - Initial Assessment Questions 1. DESCRIPTION: "Describe your dizziness."     On  Her  Back   Feels  Like falling  In  Deep  Hole   2. VERTIGO: "Do you feel like either you or the room is spinning or tilting?"       Tilting  When  She walks      3. LIGHTHEADED: "Do you feel lightheaded?" (e.g., somewhat faint, woozy, weak upon standing)        Little   Weak    4. SEVERITY: "How bad is it?"  "Can you walk?"   - MILD - Feels unsteady but walking normally.   - MODERATE - Feels very unsteady when walking, but not falling; interferes with normal activities (e.g., school, work) .   - SEVERE - Unable to walk without falling (requires assistance).      Moderate   5. ONSET:  "When did the dizziness begin?"       22  Hours  Laying  Down  Turned   And  Started  Becoming  dizzyness   6. AGGRAVATING FACTORS: "Does anything make it worse?" (e.g., standing, change in head position)      Changing  posistions   And   Movement   7. CAUSE: "What do you think is causing the dizziness?"       Movement   8. RECURRENT SYMPTOM: "Have you had dizziness before?" If so, ask: "When was the last time?" "What happened that time?"         Vertigo   In past   Years  Ago   9. OTHER SYMPTOMS: "Do you have any other symptoms?" (e.g., headache, weakness, numbness, vomiting, earache)     Nausea    Weak and  Tired    10. PREGNANCY: "Is there any chance you are  pregnant?" "When was your last menstrual period?"       N/A  Protocols used: DIZZINESS - VERTIGO-A-AH

## 2017-07-31 NOTE — Telephone Encounter (Signed)
Agree with evaluation now.  Please call and confirm pt being seen.

## 2017-08-01 NOTE — Telephone Encounter (Signed)
Thank you for the update and following up with this pt.  Let me know if I need to do anything more.

## 2017-08-01 NOTE — Telephone Encounter (Signed)
Patient was seen in [place] on  /3/19 at the [facility] was given zofran for nausea and Meclizine (Antivert) for Vertigo, patient stated she feels better today but still has some wooziness.

## 2017-08-01 NOTE — Telephone Encounter (Signed)
Please call and confirm pt doing ok.  With these symptoms, needs to be evaluated.

## 2017-08-04 ENCOUNTER — Encounter: Payer: Self-pay | Admitting: Internal Medicine

## 2017-08-08 NOTE — Patient Instructions (Signed)
Standing swimmers for thoracolumbar and glut strengthening  10 reps each side  X 2   Picking up objects from low levels, with legs instead of bending back  Transition from standing to floor: Wide squat like you are about to pick something up from the floor --> hands on the ground (all fours)  To get up, all fours--> lifts hips and walk hands backwards to feet --> mini quat --> hands on hips -->  knees glide forward and roll  Hips up instead of hinging spine up

## 2017-08-16 NOTE — ED Provider Notes (Signed)
[facility]    CSN: [id number] Arrival date & time: 04/15/17  1746     History   Chief Complaint Chief Complaint  Patient presents with  . Skin Discoloration    HPI Lynn Mann is a 67 y.o. female.   67 yo female with a c/o a mass and skin discoloration to the right forearm. States she noticed this today. Denies any trauma/injuries, fevers, chills, numbness/tingling.    The history is provided by the patient.    Past Medical History:  Diagnosis Date  . Arthritis   . Chicken pox   . Cholecystitis 11/2011   Did not require sgy - Dr. Staci Acosta - [facility]  (cholelithiasis)  . H/O Clostridium difficile infection   . IBS (irritable bowel syndrome)   . MRSA exposure 2005   Spider bite  . MVP (mitral valve prolapse)    Stable - Dr. Ubaldo Glassing  . Rheumatic fever     Patient Active Problem List   Diagnosis Date Noted  . Itching 07/21/2017  . Asthma 07/16/2016  . Urinary incontinence 07/16/2016  . Dizziness 04/01/2016  . SOB (shortness of breath) 04/01/2016  . Bilateral shoulder pain 02/24/2016  . Fatigue 01/28/2016  . Neck fullness 01/28/2016  . Routine general medical examination at a health care facility 07/25/2015  . Health care maintenance 10/09/2014  . Foot pain 06/19/2014  . Diarrhea 04/08/2014  . Hyperglycemia 02/07/2014  . Neck pain 11/26/2013  . Hypercholesterolemia 11/26/2013  . History of colonic polyps 05/11/2013  . Hyperbilirubinemia 01/18/2013  . GERD (gastroesophageal reflux disease) 12/03/2012  . Cholelithiasis 11/07/2012  . IBS (irritable bowel syndrome) 11/07/2012  . History of rheumatic fever 08/28/2012  . MVP (mitral valve prolapse) 08/28/2012    Past Surgical History:  Procedure Laterality Date  . CATARACT EXTRACTION  [id number]  . MUSCLE BIOPSY      OB History    No data available       Home Medications    Prior to Admission medications   Medication Sig Start Date End Date Taking? Authorizing Provider  albuterol  (PROVENTIL HFA;VENTOLIN HFA) 108 (90 Base) MCG/ACT inhaler Inhale 2 puffs into the lungs every 4 (four) hours as needed for wheezing or shortness of breath. 12/08/16 05/09/17  Melynda Ripple, MD  fluticasone (FLONASE) 50 MCG/ACT nasal spray Place 1 spray into both nostrils 2 (two) times daily. 12/16/15 05/09/17  Betancourt, Aura Fey, NP  fluticasone furoate-vilanterol (BREO ELLIPTA) 100-25 MCG/INH AEPB Inhale 1 puff into the lungs daily. 04/09/16   Wilhelmina Mcardle, MD  sodium chloride (OCEAN) 0.65 % SOLN nasal spray Place 2 sprays into both nostrils every 2 (two) hours while awake. 12/16/15   Betancourt, Aura Fey, NP    Family History Family History  Problem Relation Age of Onset  . Arthritis Mother   . Stroke Mother   . Diabetes Mother   . Hypertension Mother   . Arthritis Father   . Stroke Father   . Diabetes Paternal Grandmother   . Cancer Paternal Uncle        colon  . Heart disease Unknown        maternal and paternal side  . Breast cancer Paternal Aunt   . Breast cancer Cousin   . Breast cancer Cousin        female cousin    Social History Social History   Tobacco Use  . Smoking status: Never Smoker  . Smokeless tobacco: Never Used  Substance Use Topics  . Alcohol use: Yes  Alcohol/week: 0.0 oz    Comment: Rarely  . Drug use: No     Allergies   Adhesive [tape]; Tartrazine; and Yellow dyes (non-tartrazine)   Review of Systems Review of Systems   Physical Exam Triage Vital Signs ED Triage Vitals  Enc Vitals Group     BP 04/15/17 1921 (!) 128/57     Pulse Rate 04/15/17 1921 80     Resp 04/15/17 1921 16     Temp 04/15/17 1921 98.7 F (37.1 C)     Temp Source 04/15/17 1921 Oral     SpO2 04/15/17 1921 100 %     Weight 04/15/17 1925 173 lb (78.5 kg)     Height 04/15/17 1925 5\' 7"  (1.702 m)     Head Circumference --      Peak Flow --      Pain Score --      Pain Loc --      Pain Edu? --      Excl. in [place]? --    No data found.  Updated Vital Signs BP (!)  128/57 (BP Location: Left Arm)   Pulse 80   Temp 98.7 F (37.1 C) (Oral)   Resp 16   Ht 5\' 7"  (1.702 m)   Wt 173 lb (78.5 kg)   SpO2 100%   BMI 27.10 kg/m   Visual Acuity Right Eye Distance:   Left Eye Distance:   Bilateral Distance:    Right Eye Near:   Left Eye Near:    Bilateral Near:     Physical Exam  Constitutional: She appears well-developed and well-nourished. No distress.  Musculoskeletal:       Right forearm: She exhibits no tenderness, no bony tenderness, no swelling and no edema.  Right forearm with subcutaneous, soft, non-tender well circumscribed mass (likely lipoma)  Skin: She is not diaphoretic.  Nursing note and vitals reviewed.    UC Treatments / Results  Labs (all labs ordered are listed, but only abnormal results are displayed) Labs Reviewed - No data to display  EKG  EKG Interpretation None       Radiology No results found.  Procedures Procedures (including critical care time)  Medications Ordered in UC Medications - No data to display   Initial Impression / Assessment and Plan / UC Course  I have reviewed the triage vital signs and the nursing notes.  Pertinent labs & imaging results that were available during my care of the patient were reviewed by me and considered in my medical decision making (see chart for details).       Final Clinical Impressions(s) / UC Diagnoses   Final diagnoses:  Mass of subcutaneous tissue  Lipoma of right upper extremity    ED Discharge Orders    None     1. x-ray results (negative) and diagnosis reviewed with patient 2. Recommend follow up with PCP for possible further evaluation and/or referral to specialist  Controlled Substance Prescriptions [place] Controlled Substance Registry consulted? Not Applicable   Norval Gable, MD 08/16/17 [phone]

## 2017-10-21 ENCOUNTER — Telehealth: Payer: Self-pay | Admitting: Internal Medicine

## 2017-10-21 DIAGNOSIS — R32 Unspecified urinary incontinence: Secondary | ICD-10-CM

## 2017-10-21 NOTE — Telephone Encounter (Signed)
Copied from [place] [id number]. Topic: General - Other >> Oct 21, 2017  2:25 PM Cecelia Byars, NT wrote: Reason for CRM: Patient called and said she has met her deductible and is ready for a referral for physical therapy for scoliosis and pelvic therapy , per her conversation with Dr Nicki Reaper during her physical .,she was told therapy would  help with posture and conditioning .please advise  [phone]  ,

## 2017-10-21 NOTE — Telephone Encounter (Signed)
Please place referral

## 2017-10-22 NOTE — Telephone Encounter (Signed)
She previously saw Jerl Mina - pelvic floor therapist for back and pelvic issues (last 06/2017).  Is she wanting referral back to her for further evaluation and treatment?  Just need more information.

## 2017-10-23 NOTE — Telephone Encounter (Signed)
Please place referral to Dr. Jerl Mina

## 2017-10-23 NOTE — Telephone Encounter (Signed)
LMTCB

## 2017-10-23 NOTE — Telephone Encounter (Signed)
Spoke with patient she would referral back Dr Vernard Gambles [facility]  and she has meet calendar year deductible now.

## 2017-10-24 ENCOUNTER — Ambulatory Visit (INDEPENDENT_AMBULATORY_CARE_PROVIDER_SITE_OTHER): Payer: BLUE CROSS/BLUE SHIELD

## 2017-10-24 ENCOUNTER — Other Ambulatory Visit: Payer: Self-pay

## 2017-10-24 ENCOUNTER — Ambulatory Visit
Admission: EM | Admit: 2017-10-24 | Discharge: 2017-10-24 | Disposition: A | Discharge status: Home / Self Care | Payer: BLUE CROSS/BLUE SHIELD | Attending: Emergency Medicine | Admitting: Emergency Medicine

## 2017-10-24 DIAGNOSIS — J069 Acute upper respiratory infection, unspecified: Secondary | ICD-10-CM

## 2017-10-24 DIAGNOSIS — B9789 Other viral agents as the cause of diseases classified elsewhere: Secondary | ICD-10-CM

## 2017-10-24 DIAGNOSIS — B069 Rubella without complication: Secondary | ICD-10-CM

## 2017-10-24 DIAGNOSIS — R062 Wheezing: Secondary | ICD-10-CM

## 2017-10-24 LAB — RAPID INFLUENZA A&B ANTIGENS
Influenza A (ARMC): NEGATIVE
Influenza B (ARMC): NEGATIVE

## 2017-10-24 IMAGING — CR DG CHEST 2V
2 series · 2 of 2 positions shown · non-contrast
Comparison: Radiographs [DATE].

CLINICAL DATA: Cough, fever.

EXAM:
CHEST - 2 VIEW

[chest pa]
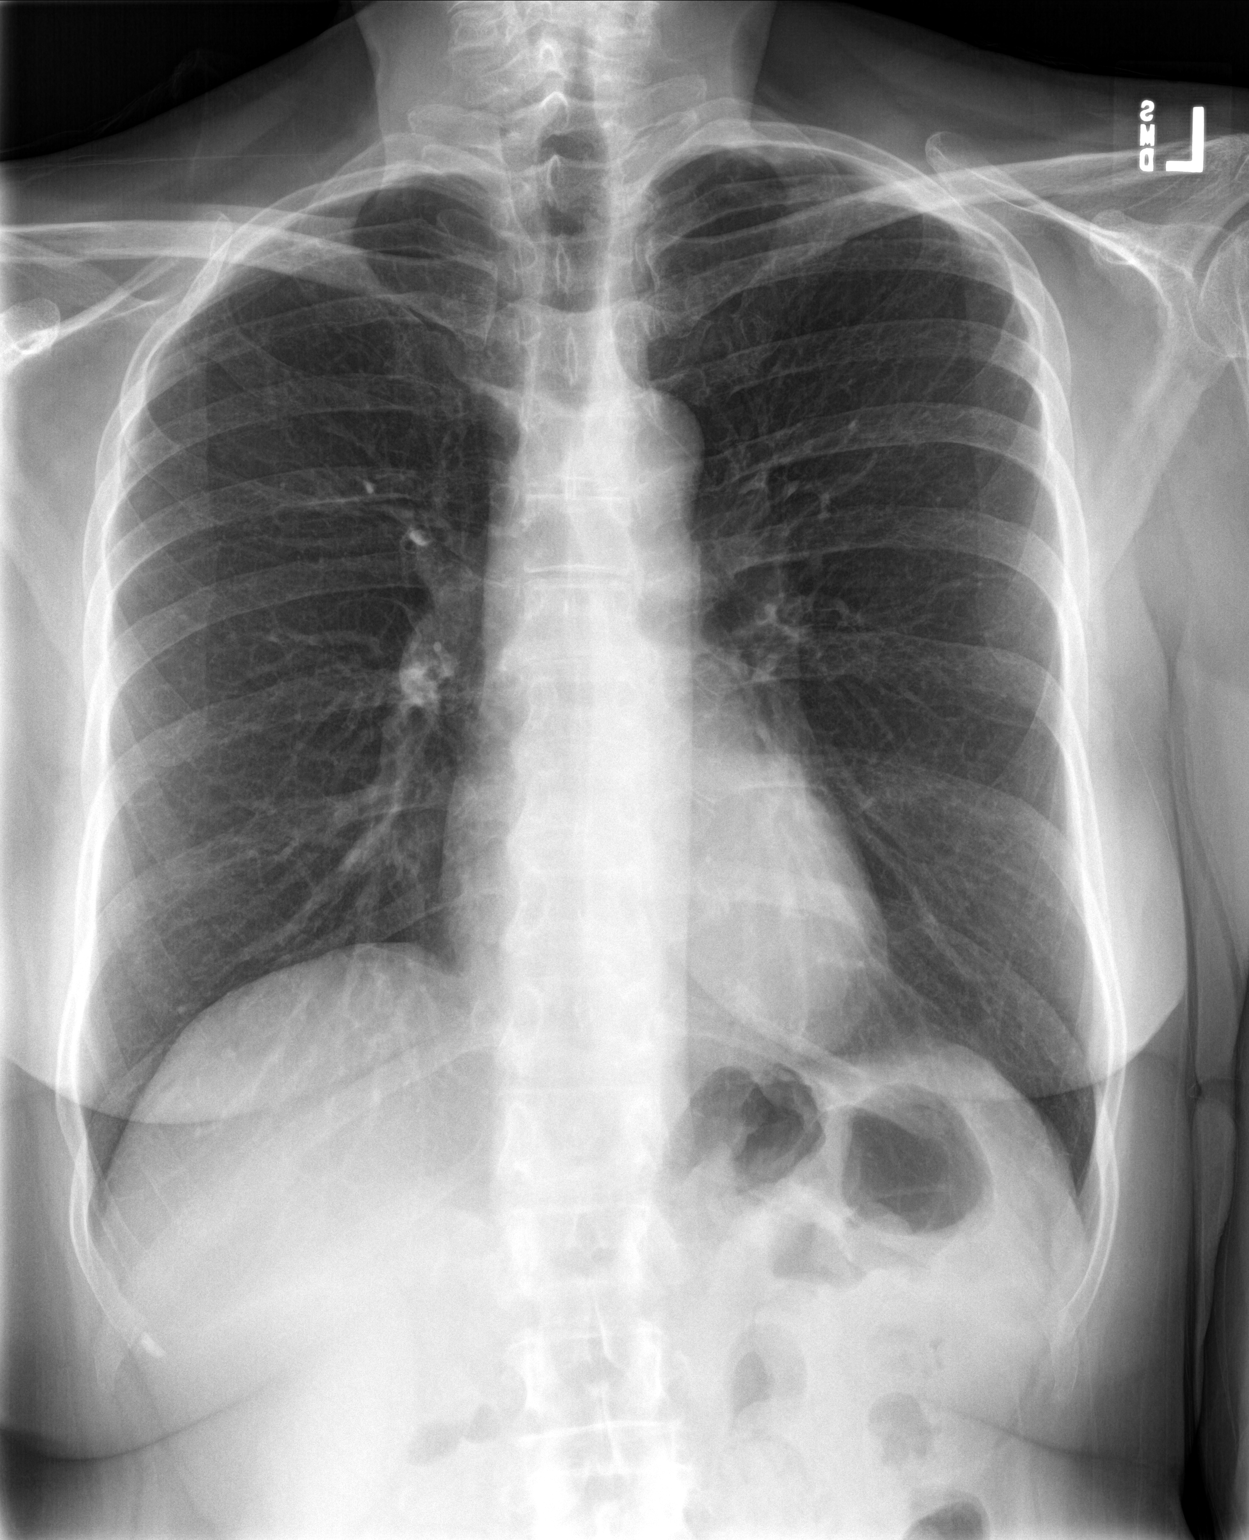

[chest lat]
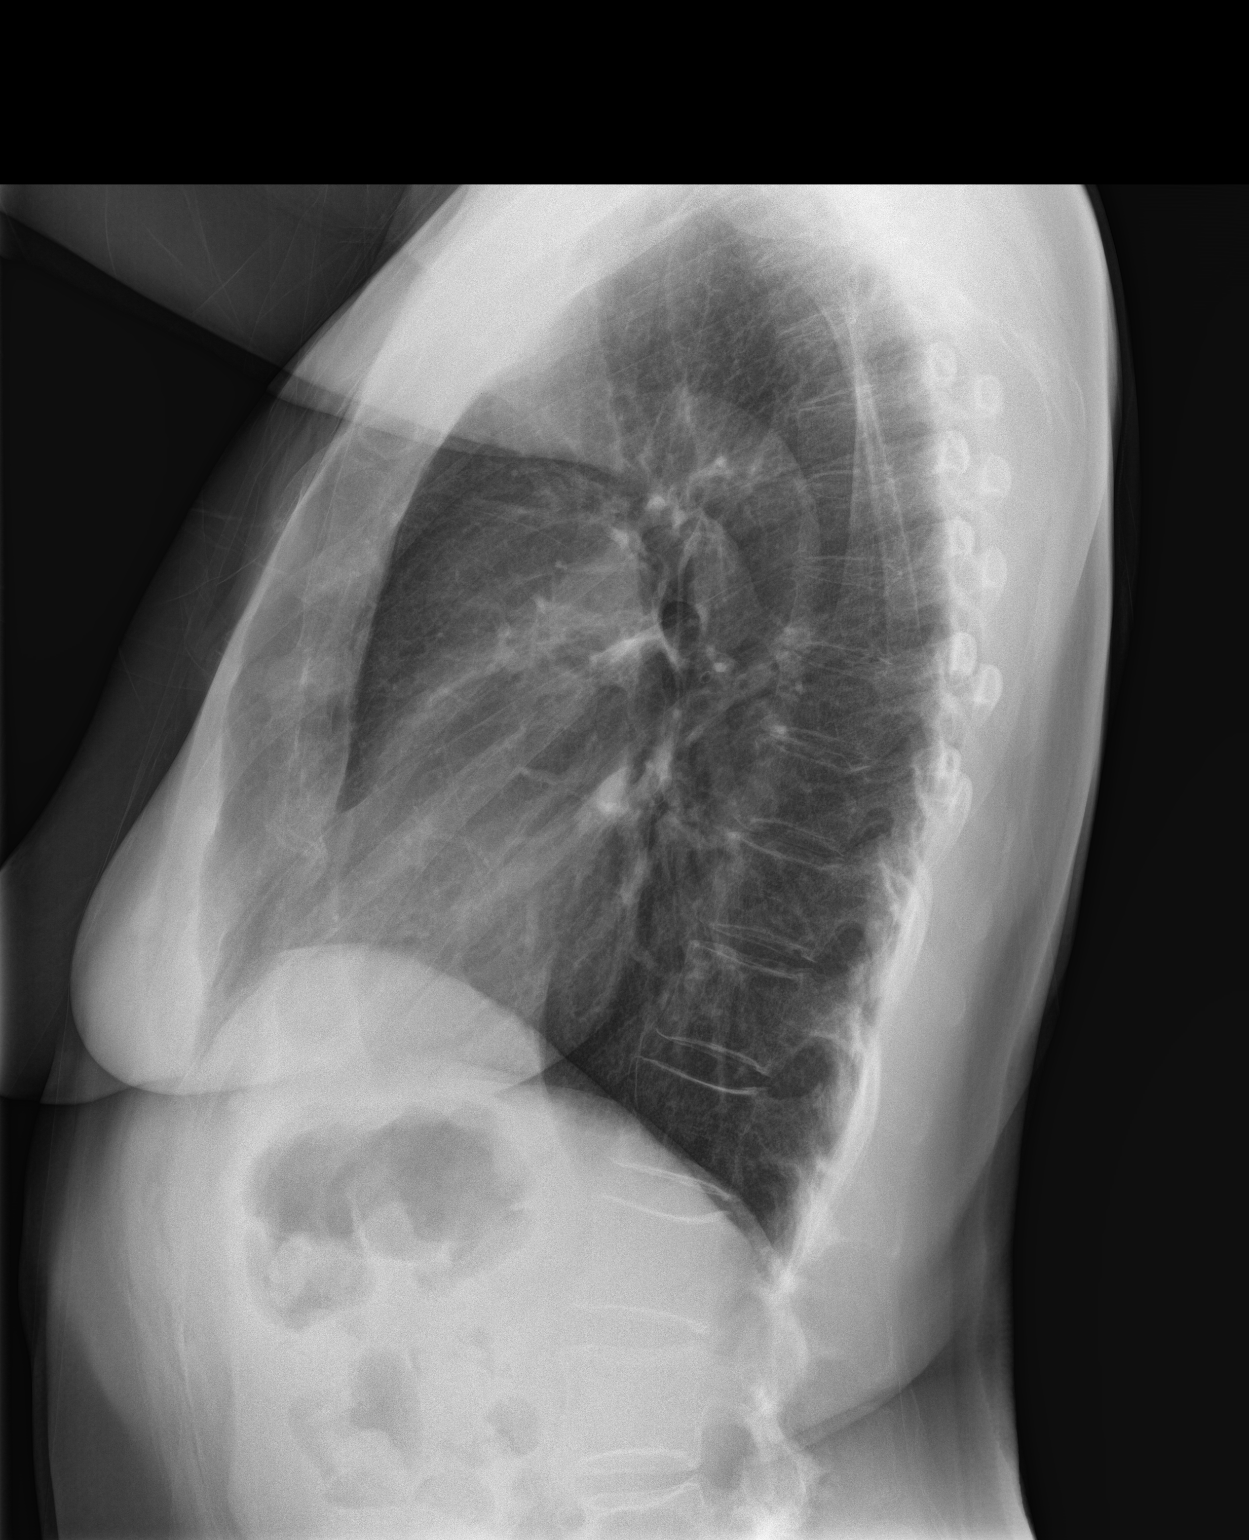

[2 of 2 positions shown; findings below may reference images not displayed]

FINDINGS: The heart size and mediastinal contours are within normal limits.
Both lungs are clear. No pneumothorax or pleural effusion is noted.
The visualized skeletal structures are unremarkable.
IMPRESSION: No active cardiopulmonary disease.

## 2017-10-24 MED ORDER — HYDROCOD POLST-CPM POLST ER 10-8 MG/5ML PO SUER: 5.0000 mL | Freq: Two times a day (BID) | ORAL | 0 refills | Status: DC | PRN

## 2017-10-24 MED ORDER — IPRATROPIUM-ALBUTEROL 0.5-2.5 (3) MG/3ML IN SOLN
3.0000 mL | Freq: Once | RESPIRATORY_TRACT | Status: AC
  Administered 2017-10-24: 3 mL via RESPIRATORY_TRACT

## 2017-10-24 MED ORDER — BENZONATATE 200 MG PO CAPS: 200.0000 mg | ORAL_CAPSULE | Freq: Three times a day (TID) | ORAL | 0 refills | Status: DC | PRN

## 2017-10-24 MED ORDER — IBUPROFEN 600 MG PO TABS: 600.0000 mg | ORAL_TABLET | Freq: Four times a day (QID) | ORAL | 0 refills | Status: DC | PRN

## 2017-10-24 NOTE — Discharge Instructions (Addendum)
2 puffs from your albuterol inhaler using your spacer every 4-6 hours.  Back off on this as you start to feel better. Tussionex for the cough at night,  Tessalon cough during the day, take Tylenol together with the ibuprofen as needed for body aches, some Mucinex D.  Irrigation with a Milta Deiters med rinse.  Continue Flonase and other inhaled steroids.  Follow-up with your primary care physician in several days to make sure that you are getting better.  Go to the ER if you get worse.

## 2017-10-24 NOTE — Telephone Encounter (Signed)
*  STAT* If patient is at the pharmacy, call can be transferred to refill team.   1. Which medications need to be refilled? (please list name of each medication and dose if known) Flonose  2. Which pharmacy/location (including street and city if local pharmacy) is medication to be sent to? [facility]  3. Do they need a 30 day or 90 day supply? [place]

## 2017-10-24 NOTE — ED Provider Notes (Signed)
HPI  SUBJECTIVE:  Lynn Mann is a 67 y.o. female who presents with nonproductive cough, wheezing, chest soreness secondary to the cough and fatigue.  Some sore throat.  States symptoms started yesterday.  She reports body aches but thinks that it was from working in the yard.  No headaches.  No fevers above 100.4.  No nasal congestion, runny nose, postnasal drip.  No sinus pain or pressure, upper dental pain.  No shortness of breath, no dyspnea on exertion.  No abdominal pain.  No allergy symptoms.  No known sick contacts.  She did not get a flu shot this year.  No GERD symptoms.  She states that she has been checking her oxygen saturation at home and it has been normal for her around 98%.  She denies neck stiffness, but she states that she feels as if her cervical lymph nodes are swollen.  Tried her albuterol inhaler with a spacer with improvement in her symptoms.  She also tried cough drops.  She states that hot soups also help.  Symptoms are worse with eating peanuts.  Denies peanut allergy.  States that she is unable to sleep at night secondary to the cough.  She has a past medical history of asthma.  She is not a smoker.  No history of diabetes, hypertension, immunocompromise.  PMD: Einar Pheasant, MD   Past Medical History:  Diagnosis Date  . Arthritis   . Chicken pox   . Cholecystitis 11/2011   Did not require sgy - Dr. Staci Acosta - [facility]  (cholelithiasis)  . H/O Clostridium difficile infection   . IBS (irritable bowel syndrome)   . MRSA exposure 2005   Spider bite  . MVP (mitral valve prolapse)    Stable - Dr. Ubaldo Glassing  . Rheumatic fever     Past Surgical History:  Procedure Laterality Date  . CATARACT EXTRACTION  [id number]  . MUSCLE BIOPSY      Family History  Problem Relation Age of Onset  . Arthritis Mother   . Stroke Mother   . Diabetes Mother   . Hypertension Mother   . Arthritis Father   . Stroke Father   . Diabetes Paternal Grandmother   . Cancer Paternal Uncle      colon  . Heart disease Unknown        maternal and paternal side  . Breast cancer Paternal Aunt   . Breast cancer Cousin   . Breast cancer Cousin        female cousin    Social History   Tobacco Use  . Smoking status: Never Smoker  . Smokeless tobacco: Never Used  Substance Use Topics  . Alcohol use: Yes    Alcohol/week: 0.0 oz    Comment: Rarely  . Drug use: No    No current facility-administered medications for this encounter.   Current Outpatient Medications:  .  albuterol (PROVENTIL HFA;VENTOLIN HFA) 108 (90 Base) MCG/ACT inhaler, Inhale 2 puffs into the lungs every 4 (four) hours as needed for wheezing or shortness of breath., Disp: 1 Inhaler, Rfl: 0 .  aspirin 325 MG tablet, Take by mouth., Disp: , Rfl:  .  fluticasone (FLONASE) 50 MCG/ACT nasal spray, Place 1 spray into both nostrils 2 (two) times daily., Disp: 16 g, Rfl: 5 .  fluticasone furoate-vilanterol (BREO ELLIPTA) 100-25 MCG/INH AEPB, Inhale 1 puff into the lungs daily., Disp: 60 each, Rfl: 5 .  sodium chloride (OCEAN) 0.65 % SOLN nasal spray, Place 2 sprays into both nostrils every  2 (two) hours while awake., Disp: , Rfl: 0 .  benzonatate (TESSALON) 200 MG capsule, Take 1 capsule (200 mg total) by mouth 3 (three) times daily as needed for cough., Disp: 30 capsule, Rfl: 0 .  chlorpheniramine-HYDROcodone (TUSSIONEX PENNKINETIC ER) 10-8 MG/5ML SUER, Take 5 mLs by mouth every 12 (twelve) hours as needed for cough., Disp: 120 mL, Rfl: 0 .  ibuprofen (ADVIL,MOTRIN) 600 MG tablet, Take 1 tablet (600 mg total) by mouth every 6 (six) hours as needed., Disp: 30 tablet, Rfl: 0  Allergies  Allergen Reactions  . Adhesive [Tape]   . Tartrazine   . Yellow Dyes (Non-Tartrazine) Other (See Comments)    Arthritis pains - it is tartrazine Yellow #5     ROS  As noted in HPI.   Physical Exam  BP (!) 115/55 (BP Location: Right Arm)   Pulse 89   Temp 99.5 F (37.5 C) (Oral)   Resp 18   Ht 5' 7.5" (1.715 m)   Wt 151 lb  (68.5 kg)   SpO2 100%   BMI 23.30 kg/m   Constitutional: Well developed, well nourished, no acute distress Eyes:  EOMI, conjunctiva normal bilaterally HENT: Normocephalic, atraumatic,mucus membranes moist.  Positive mild clear rhinorrhea.  Normal turbinates.  Normal oropharynx.  Normal tonsils.  Uvula midline.  Positive cobblestoning.  No postnasal drip.  No sinus tenderness. Neck: Shotty cervical lymphadenopathy.  No meningismus. Respiratory: Normal inspiratory effort lungs clear bilaterally.  Positive dry cough.  Diffuse chest wall tenderness Cardiovascular: Normal rate, regular rhythm, no murmurs, rubs, gallops GI: nondistended skin: No rash, skin intact Musculoskeletal: no deformities Neurologic: Alert & oriented x 3, no focal neuro deficits Psychiatric: Speech and behavior appropriate   ED Course   Medications  ipratropium-albuterol (DUONEB) 0.5-2.5 (3) MG/3ML nebulizer solution 3 mL (3 mLs Nebulization Given 10/24/17 2122)    Orders Placed This Encounter  Procedures  . Rapid Influenza A&B Antigens ([facility] only)    Standing Status:   Standing    Number of Occurrences:   1  . DG Chest 2 View    Standing Status:   Standing    Number of Occurrences:   1    Order Specific Question:   Reason for Exam (SYMPTOM  OR DIAGNOSIS REQUIRED)    Answer:   r/o PNA  . Recheck vitals    O2 sat post DuoNeb    Standing Status:   Standing    Number of Occurrences:   1  . Droplet precaution    Standing Status:   Standing    Number of Occurrences:   1    Results for orders placed or performed during the hospital encounter of 10/24/17 (from the past 24 hour(s))  Rapid Influenza A&B Antigens ([facility] only)     Status: None   Collection Time: 10/24/17  7:56 PM  Result Value Ref Range   Influenza A ([facility]) NEGATIVE NEGATIVE   Influenza B ([facility]) NEGATIVE NEGATIVE   Dg Chest 2 View  Result Date: 10/24/2017 CLINICAL DATA:  Cough, fever. EXAM: CHEST - 2 VIEW COMPARISON:  Radiographs of Dec 12, 2016. FINDINGS: The heart size and mediastinal contours are within normal limits. Both lungs are clear. No pneumothorax or pleural effusion is noted. The visualized skeletal structures are unremarkable. IMPRESSION: No active cardiopulmonary disease. Electronically Signed   By: Marijo Conception, M.D.   On: 10/24/2017 21:28    ED Clinical Impression  Viral URI with cough   ED Assessment/Plan  [place] Narcotic database reviewed  for this patient, and feel that the risk/benefit ratio today is favorable for proceeding with a prescription for controlled substance. No Opiate prescriptions since May 2018.   Will get chest x-ray because of the oxygen saturation.  She is afebrile here.  Her lungs are clear.  We will give DuoNeb and reevaluate.  Doubt strep pharyngitis.  Flu negative.    Reviewed imaging independently.  Normal chest x-ray.  See radiology report for full details.  Suspect URI with some bronchospasm.  Plan to have her continue her Flonase, inhaled steroids/LABA, albuterol inhaler.  States that she does not need a refill of the albuterol.    Post DuoNeb repeat O2 sat 100% on room air.  Lungs still clear.  Home with regular bronchodilators, Tussionex.,  Tessalon, Tylenol ibuprofen as needed for body aches, Mucinex D.  Discussed labs, imaging, MDM, plan and followup with patient.patient agrees with plan.   Meds ordered this encounter  Medications  . ipratropium-albuterol (DUONEB) 0.5-2.5 (3) MG/3ML nebulizer solution 3 mL  . chlorpheniramine-HYDROcodone (TUSSIONEX PENNKINETIC ER) 10-8 MG/5ML SUER    Sig: Take 5 mLs by mouth every 12 (twelve) hours as needed for cough.    Dispense:  120 mL    Refill:  0  . benzonatate (TESSALON) 200 MG capsule    Sig: Take 1 capsule (200 mg total) by mouth 3 (three) times daily as needed for cough.    Dispense:  30 capsule    Refill:  0  . ibuprofen (ADVIL,MOTRIN) 600 MG tablet    Sig: Take 1 tablet (600 mg total) by mouth every 6 (six) hours as  needed.    Dispense:  30 tablet    Refill:  0    *This clinic note was created using [employer]. Therefore, there may be occasional mistakes despite careful proofreading.   ?   Melynda Ripple, MD 10/24/17 2144

## 2017-10-24 NOTE — Telephone Encounter (Signed)
Order placed for referral.  

## 2017-10-24 NOTE — ED Triage Notes (Signed)
Patient complains of cough, congestion, body aches, fever x yesterday

## 2017-10-31 ENCOUNTER — Telehealth: Payer: Self-pay

## 2017-10-31 MED ORDER — FLUTICASONE PROPIONATE 50 MCG/ACT NA SUSP: 1.0000 | Freq: Two times a day (BID) | NASAL | 0 refills | Status: DC

## 2017-10-31 NOTE — Telephone Encounter (Signed)
error 

## 2017-10-31 NOTE — Telephone Encounter (Signed)
Patient called and asked for a refill on flonase. rx sent to [facility].

## 2017-11-12 ENCOUNTER — Other Ambulatory Visit (INDEPENDENT_AMBULATORY_CARE_PROVIDER_SITE_OTHER): Payer: BLUE CROSS/BLUE SHIELD

## 2017-11-12 DIAGNOSIS — E7800 Pure hypercholesterolemia, unspecified: Secondary | ICD-10-CM | POA: Diagnosis not present

## 2017-11-12 DIAGNOSIS — R739 Hyperglycemia, unspecified: Secondary | ICD-10-CM

## 2017-11-12 LAB — LIPID PANEL
Cholesterol: 186 mg/dL (ref 0–200)
HDL: 36.6 mg/dL — ABNORMAL LOW (ref >39.00)
LDL Cholesterol: 133 mg/dL — ABNORMAL HIGH (ref 0–99)
NonHDL: 149.27
Total CHOL/HDL Ratio: 5
Triglycerides: 81 mg/dL (ref 0.0–149.0)
VLDL: 16.2 mg/dL (ref 0.0–40.0)

## 2017-11-12 LAB — BASIC METABOLIC PANEL
BUN: 12 mg/dL (ref 6–23)
CO2: 24 mEq/L (ref 19–32)
Calcium: 9.3 mg/dL (ref 8.4–10.5)
Chloride: 105 mEq/L (ref 96–112)
Creatinine, Ser: 0.65 mg/dL (ref 0.40–1.20)
GFR: 97.18 mL/min (ref >60.00)
Glucose, Bld: 69 mg/dL — ABNORMAL LOW (ref 70–99)
Potassium: 4 mEq/L (ref 3.5–5.1)
Sodium: 141 mEq/L (ref 135–145)

## 2017-11-12 LAB — HEPATIC FUNCTION PANEL
ALT: 13 U/L (ref 0–35)
AST: 19 U/L (ref 0–37)
Albumin: 4 g/dL (ref 3.5–5.2)
Alkaline Phosphatase: 54 U/L (ref 39–117)
Bilirubin, Direct: 0.2 mg/dL (ref 0.0–0.3)
Total Bilirubin: 1.3 mg/dL — ABNORMAL HIGH (ref 0.2–1.2)
Total Protein: 6.6 g/dL (ref 6.0–8.3)

## 2017-11-12 NOTE — Addendum Note (Signed)
Addended by: Leeanne Rio on: 11/12/2017 09:01 AM   Modules accepted: Orders

## 2017-11-17 ENCOUNTER — Ambulatory Visit (INDEPENDENT_AMBULATORY_CARE_PROVIDER_SITE_OTHER): Payer: BLUE CROSS/BLUE SHIELD | Admitting: Internal Medicine

## 2017-11-17 DIAGNOSIS — E7800 Pure hypercholesterolemia, unspecified: Secondary | ICD-10-CM

## 2017-11-17 DIAGNOSIS — I341 Nonrheumatic mitral (valve) prolapse: Secondary | ICD-10-CM | POA: Diagnosis not present

## 2017-11-17 DIAGNOSIS — J4520 Mild intermittent asthma, uncomplicated: Secondary | ICD-10-CM | POA: Diagnosis not present

## 2017-11-17 NOTE — Progress Notes (Signed)
Patient ID: Lynn Mann, female   DOB: Dec 10, 1950, 67 y.o.   MRN: [id number]   Subjective:    Patient ID: Lynn Mann, female    DOB: [birth year], 67 y.o.   MRN: [id number]  HPI  Patient here for a scheduled follow up.  She reports she is doing relatively well.  Saw GI 10/30/17.  Using miralax.  Planning for pelvic floor PT tomorrow.  Recommended f/u colonoscopy 2024.  Saw Dr Ubaldo Glassing 10/08/17. ECHO ok.  No changes made.  Recommended f/u in 3 months.  No chest pain.  Breathing stable.  No acid reflux.  No abdominal pain.  Discussed lab results.     Past Medical History:  Diagnosis Date  . Arthritis   . Chicken pox   . Cholecystitis 11/2011   Did not require sgy - Dr. Staci Acosta - [facility]  (cholelithiasis)  . H/O Clostridium difficile infection   . IBS (irritable bowel syndrome)   . MRSA exposure 2005   Spider bite  . MVP (mitral valve prolapse)    Stable - Dr. Ubaldo Glassing  . Rheumatic fever    Past Surgical History:  Procedure Laterality Date  . CATARACT EXTRACTION  [id number]  . MUSCLE BIOPSY     Family History  Problem Relation Age of Onset  . Arthritis Mother   . Stroke Mother   . Diabetes Mother   . Hypertension Mother   . Arthritis Father   . Stroke Father   . Diabetes Paternal Grandmother   . Cancer Paternal Uncle        colon  . Heart disease Unknown        maternal and paternal side  . Breast cancer Paternal Aunt   . Breast cancer Cousin   . Breast cancer Cousin        female cousin   Social History   Socioeconomic History  . Marital status: Married    Spouse name: Not on file  . Number of children: Not on file  . Years of education: 21  . Highest education level: Not on file  Occupational History  . Occupation: Retired  Scientific laboratory technician  . Financial resource strain: Not on file  . Food insecurity:    Worry: Not on file    Inability: Not on file  . Transportation needs:    Medical: Not on file    Non-medical: Not on file  Tobacco Use  . Smoking status: Never  Smoker  . Smokeless tobacco: Never Used  Substance and Sexual Activity  . Alcohol use: Yes    Alcohol/week: 0.0 oz    Comment: Rarely  . Drug use: No  . Sexual activity: Yes    Partners: Male    Birth control/protection: Post-menopausal  Lifestyle  . Physical activity:    Days per week: Not on file    Minutes per session: Not on file  . Stress: Not on file  Relationships  . Social connections:    Talks on phone: Not on file    Gets together: Not on file    Attends religious service: Not on file    Active member of club or organization: Not on file    Attends meetings of clubs or organizations: Not on file    Relationship status: Not on file  Other Topics Concern  . Not on file  Social History Narrative  . Not on file    Outpatient Encounter Medications as of 11/17/2017  Medication Sig  . albuterol (PROVENTIL HFA;VENTOLIN HFA) 108 (90  Base) MCG/ACT inhaler Inhale 2 puffs into the lungs every 4 (four) hours as needed for wheezing or shortness of breath.  Marland Kitchen aspirin 325 MG tablet Take by mouth.  . benzonatate (TESSALON) 200 MG capsule Take 1 capsule (200 mg total) by mouth 3 (three) times daily as needed for cough. (Patient not taking: Reported on 11/18/2017)  . fluticasone (FLONASE) 50 MCG/ACT nasal spray Place 1 spray into both nostrils 2 (two) times daily.  . fluticasone furoate-vilanterol (BREO ELLIPTA) 100-25 MCG/INH AEPB Inhale 1 puff into the lungs daily. (Patient not taking: Reported on 11/18/2017)  . ibuprofen (ADVIL,MOTRIN) 600 MG tablet Take 1 tablet (600 mg total) by mouth every 6 (six) hours as needed.  . sodium chloride (OCEAN) 0.65 % SOLN nasal spray Place 2 sprays into both nostrils every 2 (two) hours while awake.  . [DISCONTINUED] chlorpheniramine-HYDROcodone (TUSSIONEX PENNKINETIC ER) 10-8 MG/5ML SUER Take 5 mLs by mouth every 12 (twelve) hours as needed for cough.   No facility-administered encounter medications on file as of 11/17/2017.     Review of Systems    Constitutional: Negative for appetite change and unexpected weight change.  HENT: Negative for congestion and sinus pressure.   Respiratory: Negative for cough and chest tightness.        Breathing stable.   Cardiovascular: Negative for chest pain, palpitations and leg swelling.  Gastrointestinal: Negative for abdominal pain, nausea and vomiting.       Bowels better with miralax.    Genitourinary: Negative for difficulty urinating and dysuria.  Musculoskeletal: Negative for joint swelling and myalgias.  Skin: Negative for color change and rash.  Neurological: Negative for dizziness, light-headedness and headaches.  Psychiatric/Behavioral: Negative for agitation and dysphoric mood.       Objective:    Physical Exam  Constitutional: She appears well-developed and well-nourished. No distress.  HENT:  Nose: Nose normal.  Mouth/Throat: Oropharynx is clear and moist.  Neck: Neck supple. No thyromegaly present.  Cardiovascular: Normal rate and regular rhythm.  Pulmonary/Chest: Breath sounds normal. No respiratory distress. She has no wheezes.  Abdominal: Soft. Bowel sounds are normal. There is no tenderness.  Musculoskeletal: She exhibits no edema or tenderness.  Lymphadenopathy:    She has no cervical adenopathy.  Skin: No rash noted. No erythema.  Psychiatric: She has a normal mood and affect. Her behavior is normal.    BP 126/80 (BP Location: Left Arm, Patient Position: Sitting, Cuff Size: Normal)   Pulse 80   Temp 98.3 F (36.8 C) (Oral)   Resp 18   Wt 155 lb 9.6 oz (70.6 kg)   SpO2 95%   BMI 24.01 kg/m  Wt Readings from Last 3 Encounters:  11/17/17 155 lb 9.6 oz (70.6 kg)  10/24/17 151 lb (68.5 kg)  07/18/17 164 lb 3.2 oz (74.5 kg)     Lab Results  Component Value Date   WBC 4.7 02/11/2017   HGB 14.3 02/11/2017   HCT 42.2 02/11/2017   PLT 277.0 02/11/2017   GLUCOSE 69 (L) 11/12/2017   CHOL 186 11/12/2017   TRIG 81.0 11/12/2017   HDL 36.60 (L) 11/12/2017    LDLDIRECT 110.0 02/11/2017   LDLCALC 133 (H) 11/12/2017   ALT 13 11/12/2017   AST 19 11/12/2017   NA 141 11/12/2017   K 4.0 11/12/2017   CL 105 11/12/2017   CREATININE 0.65 11/12/2017   BUN 12 11/12/2017   CO2 24 11/12/2017   TSH 3.77 02/11/2017   HGBA1C 5.4 07/15/2017    Dg Chest 2  View  Result Date: 10/24/2017 CLINICAL DATA:  Cough, fever. EXAM: CHEST - 2 VIEW COMPARISON:  Radiographs of Dec 12, 2016. FINDINGS: The heart size and mediastinal contours are within normal limits. Both lungs are clear. No pneumothorax or pleural effusion is noted. The visualized skeletal structures are unremarkable. IMPRESSION: No active cardiopulmonary disease. Electronically Signed   By: Marijo Conception, M.D.   On: 10/24/2017 21:28       Assessment & Plan:   Problem List Items Addressed This Visit    Asthma    Breathing stable.  Has seen Dr Alva Garnet.       Hyperbilirubinemia    Follow liver panel.       Relevant Orders   Hepatic function panel   Hypercholesterolemia    Discussed lab results.  Calculated risk 6.2%.  Low cholesterol diet and exercise.  Follow.        Relevant Orders   CBC with Differential/Platelet   Lipid panel   TSH   Basic metabolic panel   MVP (mitral valve prolapse)    Saw Dr Ubaldo Glassing 09/2017.  ECHO ok.  Follow.           Einar Pheasant, MD

## 2017-11-18 ENCOUNTER — Other Ambulatory Visit: Payer: Self-pay

## 2017-11-18 ENCOUNTER — Ambulatory Visit: Payer: BLUE CROSS/BLUE SHIELD | Attending: Internal Medicine | Admitting: Physical Therapy

## 2017-11-18 ENCOUNTER — Encounter: Payer: Self-pay | Admitting: Physical Therapy

## 2017-11-18 DIAGNOSIS — M6281 Muscle weakness (generalized): Secondary | ICD-10-CM

## 2017-11-18 DIAGNOSIS — M791 Myalgia, unspecified site: Secondary | ICD-10-CM

## 2017-11-18 DIAGNOSIS — R279 Unspecified lack of coordination: Secondary | ICD-10-CM | POA: Diagnosis present

## 2017-11-18 DIAGNOSIS — M4125 Other idiopathic scoliosis, thoracolumbar region: Secondary | ICD-10-CM | POA: Diagnosis present

## 2017-11-18 DIAGNOSIS — R293 Abnormal posture: Secondary | ICD-10-CM

## 2017-11-18 NOTE — Patient Instructions (Addendum)
[email or web address]   Proper body mechanics with getting out of a chair to decrease strain  on back &pelvic floor   Avoid holding your breath when Getting out of the chair:  Scoot to front part of chair chair Heels behind feet, feet/knee are hip width apart , nose over toes  Inhale like you are smelling roses Exhale to stand

## 2017-11-20 NOTE — Therapy (Addendum)
[address] MAIN [facility] SERVICES [place], [place], [address] Phone: [phone]   Fax:  [phone]  Physical Therapy Evaluation  Patient Details  Name: Lynn Mann MRN: [id number] Date of Birth: Nov 18, 1950 Referring Provider: Einar Pheasant, MD    Encounter Date: 11/18/2017  PT End of Session - 11/20/17 1205    Visit Number  1    Number of Visits  12    Date for PT Re-Evaluation  02/12/18    PT Start Time  1410    PT Stop Time  1500    PT Time Calculation (min)  50 min    Activity Tolerance  Patient tolerated treatment well    Behavior During Therapy  [facility] for tasks assessed/performed       Past Medical History:  Diagnosis Date  . Arthritis   . Chicken pox   . Cholecystitis 11/2011   Did not require sgy - Dr. Staci Acosta - [facility]  (cholelithiasis)  . H/O Clostridium difficile infection   . IBS (irritable bowel syndrome)   . MRSA exposure 2005   Spider bite  . MVP (mitral valve prolapse)    Stable - Dr. Ubaldo Glassing  . Rheumatic fever     Past Surgical History:  Procedure Laterality Date  . CATARACT EXTRACTION  [id number]  . MUSCLE BIOPSY      There were no vitals filed for this visit.   Subjective Assessment - 11/20/17 1223    Subjective 1) Urinary Sx:  Pt had completed Pelvic PT for UI in Dec 2018. Pt had improved by 60% with wearing pads off and on at night for nighttime leakage prior to PT and then not needing to wear pads at all by the time she finished with PT.   Pt had kept up with her pelvic floor exercises 5 x week between the period of her completing PT and her relapse. Pt has since stopped all exercises all together because she got out of the habit of doing them.  Four weeks after completing PT, pt started to notice she had total flooding of urine. Pt had to wear depends at night and day.  Currently she changes her pads at night 6 x per night. Pt is not able to make it to the bathroom before leakage. When she stands  all day and being on her feet, she had no leakage problem. The leakage/flooding occurs with getting up/  out of a chair. Physical activity: 3-4 miles walking daily .   2) Constipation with bowel movements occuring every 3 days. Stool Type base don Bristol Scale Type 4 ( 50%) and Type 7 ( 50%).  Most of her life she states she has been Type 7. Pt reports her LBP has resolved since last PT sessions in Dec.         Pertinent History  Pt had a colonoscopy which showed internal hemorrhoids, polyp that were benign and removed. Pt had lost 40 lb between May 2018 to current. Pt has felt she lost muscle mass through her weight loss which she did not following any diets. Pt is now trying has enough protein for build muscles but she has not worked with a nutritionist.            [facility] PT Assessment - 11/20/17 1207      Assessment   Medical Diagnosis  urinary incontinence     Referring Provider  Einar Pheasant, MD       Precautions   Precautions  None      Restrictions   Weight Bearing Restrictions  No      Balance Screen   Has the patient fallen in the past 6 months  No      Prior Function   Level of Independence  Independent      Coordination   Gross Motor Movements are Fluid and Coordinated  -- proper diaphragm excursion, deep core co-activation      Sit to Stand   Comments  severe genu valgus without BUE support. less genu valgus with cue for alignment/ BUE on chair                 Objective measurements completed on examination: See above findings.    Pelvic Floor Special Questions - 11/20/17 1202    Exam Type  Vaginal    Palpation  obt in B    Strength  good squeeze, good lift, able to hold agaisnt strong resistance       [facility] Adult PT Treatment/Exercise - 11/20/17 1158      Neuro Re-ed    Neuro Re-ed Details   see pt instructions      Manual Therapy   Internal Pelvic Floor  obturator int B                   PT Long Term Goals - 11/20/17 1128       PT LONG TERM GOAL #1   Title  Pt will decrease her PFDI score from 47% to < 42% in order to improve pelvic floor function     Time  8    Period  Weeks    Status  New    Target Date  01/15/18      PT LONG TERM GOAL #2   Title  Pt will report compliance with proper toileting technique and not squatting/ hovering over the toilet in public restrooms in order to promote pelvic health    Time  2    Period  Weeks    Status  New    Target Date  12/04/17      PT LONG TERM GOAL #3   Title  Pt will demo IND with pelvic floor exercises     Time  12    Period  Weeks    Status  New    Target Date  01/15/18      PT LONG TERM GOAL #4   Title  Pt will be referred to community fitness classes a senior center for overall strengthening     Time  4    Period  Weeks    Status  New    Target Date  12/18/17      PT LONG TERM GOAL #5   Title  Pt will improve her bowel movements from every 3 days to daily and Stool Type  Bristol Scale from  Type 4 ( 50%) and Type 7 ( 50%) to Type 4 ( 75% of the time) in order to improve GI/ pelvic function     Time  8    Period  Weeks    Status  New    Target Date  01/15/18             Plan - 11/20/17 1220    Clinical Impression Statement Pt is 67 yo female who reports pelvic floor dysfunction with urinary leakage and constipation. Urinary leakage occurs at night and with sit to stand.  Currently, she changes her pads at night 6 x  per night. Pt is not able to make it to the bathroom before leakage. When she stands all day and being on her feet, she had no leakage problem. The leakage/flooding occurs with getting up/down from chair. These Sx occurred four weeks after completing PT when she got out of the habit of doing them. When she  completed Pelvic PT for UI in Dec 2018, she had improved by 60% with wearing pads off and on at night for nighttime leakage prior to PT and then not needing to wear pads at all by the time she completed PT. Pt's LBP has resolved since  completing PT.  Today, Pt also reported constipation and loose consistency in stools. Clinical presentations include lower extremity weakness and genu valgus in sit to stand, pelvic floor tightness, and good carry over with deep core coordination. Pt will also benefit from overall general conditioning.        Pertinent info:  Pt had a colonoscopy which showed internal hemorrhoids, polyp that were benign and removed. Pt had lost 40 lb between May 2018 to current. Pt has felt she lost muscle mass through her weight loss which she did not following any diets. Pt is now trying has enough protein for build muscles but she has not worked with a nutritionist.          Clinical Presentation  Evolving    Clinical Decision Making  Moderate    PT Frequency  1x / week    PT Duration  12 weeks    PT Treatment/Interventions  Neuromuscular re-education;Balance training;Therapeutic exercise;Orthotic Fit/Training;Functional mobility training;Moist Heat;Traction;Therapeutic activities;Patient/family education;Manual techniques    Consulted and Agree with Plan of Care  Patient       Patient will benefit from skilled therapeutic intervention in order to improve the following deficits and impairments:  Postural dysfunction, Increased muscle spasms, Decreased scar mobility, Decreased coordination, Decreased mobility, Decreased range of motion, Decreased endurance, Decreased activity tolerance, Difficulty walking, Decreased safety awareness, Decreased balance, Abnormal gait, Decreased strength, Hypomobility, Improper body mechanics  Visit Diagnosis: Myalgia  Unspecified lack of coordination  Muscle weakness (generalized)  Abnormal posture     Problem List Patient Active Problem List   Diagnosis Date Noted  . Itching 07/21/2017  . Asthma 07/16/2016  . Urinary incontinence 07/16/2016  . Dizziness 04/01/2016  . SOB (shortness of breath) 04/01/2016  . Bilateral shoulder pain 02/24/2016  . Fatigue  01/28/2016  . Neck fullness 01/28/2016  . Routine general medical examination at a health care facility 07/25/2015  . Health care maintenance 10/09/2014  . Foot pain 06/19/2014  . Diarrhea 04/08/2014  . Hyperglycemia 02/07/2014  . Neck pain 11/26/2013  . Hypercholesterolemia 11/26/2013  . History of colonic polyps 05/11/2013  . Hyperbilirubinemia 01/18/2013  . GERD (gastroesophageal reflux disease) 12/03/2012  . Cholelithiasis 11/07/2012  . IBS (irritable bowel syndrome) 11/07/2012  . History of rheumatic fever 08/28/2012  . MVP (mitral valve prolapse) 08/28/2012    Jerl Mina ,PT, DPT, E-RYT  11/20/2017, 12:23 PM  [place] MAIN [facility] SERVICES [address] [place], [place], [address] Phone: [phone]   Fax:  [phone]  Name: TASMIN EXANTUS MRN: [id number] Date of Birth: 1951/07/29

## 2017-11-21 ENCOUNTER — Encounter: Payer: Self-pay | Admitting: Internal Medicine

## 2017-11-21 NOTE — Assessment & Plan Note (Signed)
Breathing stable.  Has seen Dr Alva Garnet.

## 2017-11-21 NOTE — Assessment & Plan Note (Signed)
Discussed lab results.  Calculated risk 6.2%.  Low cholesterol diet and exercise.  Follow.

## 2017-11-21 NOTE — Assessment & Plan Note (Signed)
Follow liver panel.  

## 2017-11-21 NOTE — Assessment & Plan Note (Signed)
Saw Dr Ubaldo Glassing 09/2017.  ECHO ok.  Follow.

## 2017-11-24 ENCOUNTER — Ambulatory Visit: Payer: BLUE CROSS/BLUE SHIELD | Admitting: Physical Therapy

## 2017-11-24 DIAGNOSIS — M791 Myalgia, unspecified site: Secondary | ICD-10-CM

## 2017-11-24 DIAGNOSIS — M4125 Other idiopathic scoliosis, thoracolumbar region: Secondary | ICD-10-CM

## 2017-11-24 DIAGNOSIS — M6281 Muscle weakness (generalized): Secondary | ICD-10-CM

## 2017-11-24 DIAGNOSIS — R293 Abnormal posture: Secondary | ICD-10-CM

## 2017-11-24 DIAGNOSIS — R279 Unspecified lack of coordination: Secondary | ICD-10-CM

## 2017-11-24 NOTE — Addendum Note (Signed)
Addended by: Jerl Mina on: 11/24/2017 11:58 AM   Modules accepted: Orders

## 2017-11-24 NOTE — Therapy (Signed)
[place] MAIN [facility] SERVICES [address] [place], [place], [address] Phone: [phone]   Fax:  [phone]  Physical Therapy Treatment  Patient Details  Name: Lynn Mann MRN: [id number] Date of Birth: Dec 09, 1950 Referring Provider: Einar Pheasant, MD    Encounter Date: 11/24/2017  PT End of Session - 11/24/17 1712    Visit Number  2    Number of Visits  12    Date for PT Re-Evaluation  02/10/18    PT Start Time  3825    PT Stop Time  1500    PT Time Calculation (min)  45 min    Activity Tolerance  Patient tolerated treatment well    Behavior During Therapy  [facility] for tasks assessed/performed       Past Medical History:  Diagnosis Date  . Arthritis   . Chicken pox   . Cholecystitis 11/2011   Did not require sgy - Dr. Staci Acosta - [facility]  (cholelithiasis)  . H/O Clostridium difficile infection   . IBS (irritable bowel syndrome)   . MRSA exposure 2005   Spider bite  . MVP (mitral valve prolapse)    Stable - Dr. Ubaldo Glassing  . Rheumatic fever     Past Surgical History:  Procedure Laterality Date  . CATARACT EXTRACTION  [id number]  . MUSCLE BIOPSY      There were no vitals filed for this visit.  Subjective Assessment - 11/24/17 1424    Subjective  Pt reported she has noticed some pain i nthe lower abdominal area with pelvic floor contractions     Pertinent History  Pt had a colonscopy which showed internal hemorrhoids, polyp that were benign and removed. Pt had lost 40 lb between May 2018 to current. Pt has felt she lost muscle mass through her weight loss which she did not following any diets. Pt is now trying has enough protein for build muscles but she has not worked with a nutritionist.            [facility] PT Assessment - 11/24/17 1724      Observation/Other Assessments   Observations  increased lumbar lordosis/ poor priopioception of lower kinetic chain and pelvic  w/ new HEP       Sit to Stand   Comments  improved knee  alignment with sit to stand with training                  Pelvic Floor Special Questions - 11/24/17 1724    Pelvic Floor Internal Exam  pt consented verbally without contraindications    Exam Type  Vaginal    Palpation  deep transverse perineal mm tightness/ scar restrictions     Strength  good squeeze, good lift, able to hold agaisnt strong resistance        [facility] Adult PT Treatment/Exercise - 11/24/17 1511      Exercises   Exercises  -- see pt instructions      Manual Therapy   Internal Pelvic Floor  deep perineal tranverse R STM, MWM                   PT Long Term Goals - 11/20/17 1128      PT LONG TERM GOAL #1   Title  Pt will decrease her PFDI score from 47% to < 42% in order to improve pelvic floor function     Time  8    Period  Weeks    Status  New  Target Date  01/15/18      PT LONG TERM GOAL #2   Title  Pt will report compliance with proper toileting technique and not squatting/ hovering over the toilet in public restrooms in order to promote pelvic health    Time  2    Period  Weeks    Status  New    Target Date  12/04/17      PT LONG TERM GOAL #3   Title  Pt will demo IND with pelvic floor exercises     Time  12    Period  Weeks    Status  New    Target Date  01/15/18      PT LONG TERM GOAL #4   Title  Pt will be referred to community fitness classes a senior center for overall strengthening     Time  4    Period  Weeks    Status  New    Target Date  12/18/17      PT LONG TERM GOAL #5   Title  Pt will improve her bowel movements from every 3 days to daily and Stool Type  Bristol Scale from  Type 4 ( 50%) and Type 7 ( 50%) to Type 4 ( 75% of the time) in order to improve GI/ pelvic function     Time  8    Period  Weeks    Status  New    Target Date  01/15/18            Plan - 11/24/17 1713    Clinical Impression Statement  Pt showed improved dynamic balance compared to last year with PT in the same exercise given at  that time but this time, she required cues for less lumbar lordosis and more anterior COM  with propioception alignment.  Pt showed decreased perineal mm scar restrictions post Tx. Initiated lower kinetic chain strengthening today and sit to stand technique to minimize genu valgus . Pt continues to benefit from skilled PT.     PT Frequency  1x / week    PT Duration  12 weeks    PT Treatment/Interventions  Neuromuscular re-education;Balance training;Therapeutic exercise;Orthotic Fit/Training;Functional mobility training;Moist Heat;Traction;Therapeutic activities;Patient/family education;Manual techniques    Consulted and Agree with Plan of Care  Patient       Patient will benefit from skilled therapeutic intervention in order to improve the following deficits and impairments:  Postural dysfunction, Increased muscle spasms, Decreased scar mobility, Decreased coordination, Decreased mobility, Decreased range of motion, Decreased endurance, Decreased activity tolerance, Difficulty walking, Decreased safety awareness, Decreased balance, Abnormal gait, Decreased strength, Hypomobility, Improper body mechanics  Visit Diagnosis: Unspecified lack of coordination  Myalgia  Muscle weakness (generalized)  Abnormal posture  Other idiopathic scoliosis, thoracolumbar region     Problem List Patient Active Problem List   Diagnosis Date Noted  . Itching 07/21/2017  . Asthma 07/16/2016  . Urinary incontinence 07/16/2016  . Dizziness 04/01/2016  . SOB (shortness of breath) 04/01/2016  . Bilateral shoulder pain 02/24/2016  . Fatigue 01/28/2016  . Neck fullness 01/28/2016  . Routine general medical examination at a health care facility 07/25/2015  . Health care maintenance 10/09/2014  . Foot pain 06/19/2014  . Diarrhea 04/08/2014  . Neck pain 11/26/2013  . Hypercholesterolemia 11/26/2013  . History of colonic polyps 05/11/2013  . Hyperbilirubinemia 01/18/2013  . GERD (gastroesophageal reflux  disease) 12/03/2012  . Cholelithiasis 11/07/2012  . IBS (irritable bowel syndrome) 11/07/2012  . History of rheumatic fever  08/28/2012  . MVP (mitral valve prolapse) 08/28/2012    Jerl Mina ,PT, DPT, E-RYT  11/24/2017, 5:26 PM  [place] MAIN [facility] SERVICES [address] [place], [place], [address] Phone: [phone]   Fax:  [phone]  Name: Lynn Mann MRN: [id number] Date of Birth: 1951-01-07

## 2017-11-24 NOTE — Patient Instructions (Signed)
Step back , trunk leaning, opposition arm up in half "v"   Look at the floor to not arch the back  3 min x 3 sets     _____   Minisquat: Scoot buttocks back slight, hinge like you are looking at your reflection on a pond  Knees behind toes,  Inhale to "smell flowers"  Exhale on the rise "like rocket"  Do not lock knees, have more weight across ballmounds of feet, toes relaxed   THEN HALF step on both feet first lap with left foot leading down a hall way = 1 lap  Repeated with other foot leading =1 lap  2 laps each side  X 3  sets

## 2017-12-02 ENCOUNTER — Telehealth: Payer: Self-pay | Admitting: Internal Medicine

## 2017-12-02 ENCOUNTER — Ambulatory Visit: Payer: BLUE CROSS/BLUE SHIELD | Attending: Internal Medicine | Admitting: Physical Therapy

## 2017-12-02 ENCOUNTER — Telehealth: Payer: Self-pay

## 2017-12-02 DIAGNOSIS — R279 Unspecified lack of coordination: Secondary | ICD-10-CM

## 2017-12-02 DIAGNOSIS — M6281 Muscle weakness (generalized): Secondary | ICD-10-CM

## 2017-12-02 DIAGNOSIS — M791 Myalgia, unspecified site: Secondary | ICD-10-CM

## 2017-12-02 DIAGNOSIS — M4125 Other idiopathic scoliosis, thoracolumbar region: Secondary | ICD-10-CM

## 2017-12-02 DIAGNOSIS — R293 Abnormal posture: Secondary | ICD-10-CM

## 2017-12-02 DIAGNOSIS — M533 Sacrococcygeal disorders, not elsewhere classified: Secondary | ICD-10-CM | POA: Insufficient documentation

## 2017-12-02 DIAGNOSIS — M25551 Pain in right hip: Secondary | ICD-10-CM | POA: Insufficient documentation

## 2017-12-02 NOTE — Therapy (Addendum)
[place] MAIN [facility] SERVICES [address] [place], [place], [address] Phone: [phone]   Fax:  [phone]  Patient Details  Name: Lynn Mann MRN: [id number] Date of Birth: 10-25-1950 Referring Provider:  Einar Pheasant, MD  Encounter Date: 12/02/2017  NOTE   Pt reported last week she has been tired. Last Thursday 11/28/17 ( 5 days ago), pt experienced a sudden "sharp pain"  "ping" in her L armpit and it went down her arm and up her neck and throat lasting for 30 min.  "It was real bad than she almost dropped what she had in her hand. Pt was shaky and queasy  Like her " blood sugar dropped" . "  The pain decreased to an ache. Pt had to leave the store and sit in her car to rest.  Since this episode, she has been feeling tired. The arm ache is no longer present.    Pt has denied changes to vision, dizziness/ lightheadedness, nor chest pain.  Pt reported she has been having trouble turning her head to the R and she has had spurs in her cervical vertebra.  Pt reported she thought this Sx was related to her cervical spine.   PT spoke to Opal Sidles , RN at Dr. Bary Leriche office ( PCP) over the phone and she will be contacting pt to get her in for a visit before her next PT appt Mon 5/13.   PT asked RN to communicate any conraindications/ withholding from PT.   PT explained to pt the signs and symptoms of heart attack and advised pt to visit the ER immediately if these Sx occur again and to not wait. Pt voiced understanding.  PT session was withheld today. PT advised pt to stop doing exercises involving the lifting of the arm or increasing heart rate. Pt was advised pt to only do the exercises that are done on her back ( pelvic floor and deep core with breathing) and to leisure walking 15 min x 2/ day with rests for this week until further medical work up.     In regards to PT updates:  Pelvic improvements since last session: the day after last session, pt has  had normal formed stools everyday and the diarrhea as resolved.    Jerl Mina ,PT, DPT, E-RYT  12/02/2017, 2:21 PM  [place] MAIN [facility] SERVICES [address] [place], [place], [address] Phone: [phone]   Fax:  [phone]

## 2017-12-02 NOTE — Telephone Encounter (Signed)
See unrouted msg

## 2017-12-02 NOTE — Telephone Encounter (Signed)
Called patient after receiving message from triage nurse that patient told pt about symptoms of dizziness and pain in arm and shoulder and feeling fatigued. PT has noted issue in phone encounter. Called patient to check on symptoms and she states that she still feels tired but would like to get an appointment with Dr. Nicki Reaper since PT will like for her to get checked out by PCP before returning. Please advise.

## 2017-12-02 NOTE — Telephone Encounter (Signed)
I tried to call patient back to get more info. Left voicemail for patient to call office back. Please advise

## 2017-12-02 NOTE — Telephone Encounter (Signed)
Withholding PT today:  Patient is reporting that 5 days ago she had sharp pain in  L arm/neck/ throat that was  severe enough to about bring her to her knees. She reports being shaky and nauseous at the time.Pain decreased to 8 and finally subsided after 30 minutes. Since the episode she has had fatigue and tiredness. She reports normal BP since the episode. PT is not going to continue with her until they are sure that patient is healthy enough to continue and would like to have her checked due to the symptoms she reported. Patient contact is [phone].  Next PT appointment is Monday and they would like to know if she has precautions- please let them know- [phone]. They have charted her report to them in detail.

## 2017-12-02 NOTE — Telephone Encounter (Signed)
Agree . Patient needs to be seen by Dr Nicki Reaper or another provider

## 2017-12-02 NOTE — Telephone Encounter (Signed)
This message was in another phone note from Lynn Mann:  Called patient after receiving message from triage nurse that patient told pt about symptoms of dizziness and pain in arm and shoulder and feeling fatigued. PT has noted issue in phone encounter. Called patient to check on symptoms and she states that she still feels tired but would like to get an appointment with Dr. Nicki Reaper since PT will like for her to get checked out by PCP before returning. Please advise.

## 2017-12-03 ENCOUNTER — Telehealth: Payer: Self-pay | Admitting: Physical Therapy

## 2017-12-03 ENCOUNTER — Ambulatory Visit
Admission: EM | Admit: 2017-12-03 | Discharge: 2017-12-03 | Disposition: A | Discharge status: Home / Self Care | Payer: BLUE CROSS/BLUE SHIELD | Attending: Family Medicine | Admitting: Family Medicine

## 2017-12-03 ENCOUNTER — Other Ambulatory Visit: Payer: Self-pay

## 2017-12-03 ENCOUNTER — Encounter: Payer: Self-pay | Admitting: Emergency Medicine

## 2017-12-03 DIAGNOSIS — Z79899 Other long term (current) drug therapy: Secondary | ICD-10-CM | POA: Insufficient documentation

## 2017-12-03 DIAGNOSIS — E7800 Pure hypercholesterolemia, unspecified: Secondary | ICD-10-CM | POA: Insufficient documentation

## 2017-12-03 DIAGNOSIS — M79602 Pain in left arm: Secondary | ICD-10-CM

## 2017-12-03 DIAGNOSIS — R0602 Shortness of breath: Secondary | ICD-10-CM | POA: Diagnosis not present

## 2017-12-03 DIAGNOSIS — R079 Chest pain, unspecified: Secondary | ICD-10-CM | POA: Insufficient documentation

## 2017-12-03 DIAGNOSIS — M25512 Pain in left shoulder: Secondary | ICD-10-CM | POA: Diagnosis not present

## 2017-12-03 DIAGNOSIS — M1990 Unspecified osteoarthritis, unspecified site: Secondary | ICD-10-CM | POA: Diagnosis not present

## 2017-12-03 DIAGNOSIS — Z7982 Long term (current) use of aspirin: Secondary | ICD-10-CM | POA: Insufficient documentation

## 2017-12-03 DIAGNOSIS — J45909 Unspecified asthma, uncomplicated: Secondary | ICD-10-CM | POA: Insufficient documentation

## 2017-12-03 DIAGNOSIS — Z7951 Long term (current) use of inhaled steroids: Secondary | ICD-10-CM | POA: Diagnosis not present

## 2017-12-03 DIAGNOSIS — R32 Unspecified urinary incontinence: Secondary | ICD-10-CM | POA: Diagnosis not present

## 2017-12-03 DIAGNOSIS — K589 Irritable bowel syndrome without diarrhea: Secondary | ICD-10-CM | POA: Diagnosis not present

## 2017-12-03 DIAGNOSIS — I341 Nonrheumatic mitral (valve) prolapse: Secondary | ICD-10-CM | POA: Diagnosis not present

## 2017-12-03 DIAGNOSIS — K219 Gastro-esophageal reflux disease without esophagitis: Secondary | ICD-10-CM | POA: Diagnosis not present

## 2017-12-03 NOTE — Telephone Encounter (Signed)
Patient stated that this happened on Thursday but has been tired since then. Advised patient to be evaluated today and she agreed to comply.

## 2017-12-03 NOTE — Telephone Encounter (Signed)
Duplicate.  See attached.   

## 2017-12-03 NOTE — Discharge Instructions (Signed)
EKG and exam were unremarkable.  Possibly related to cervical radiculopathy.  If recurs, please talk with your primary.  This does not appear to be cardiac in nature.    Take care  Dr. Lacinda Axon

## 2017-12-03 NOTE — ED Triage Notes (Signed)
Patient states that she was shopping last Thursday (11/27/17) when she had a sharp pain in her left shoulder, down her left arm and around her neck and jaw that lasted 20-30 minutes. Patient is having physical therapy for reconditioning and her physical therapist told her she should come in and be checked out. Patient's PCP or cardiologist couldn't see her.

## 2017-12-03 NOTE — Telephone Encounter (Signed)
Physical therapist called pt to f/u on whether she was able to get an appt in with her PCP re: her Sx ( see note in her chart 12/02/17)  Pt reported she was not able to get an appt and was told to go to a walk-in clinic. Pt also asked about getting an appt with   Dr. Ubaldo Glassing ( pt's cardiologist). PT phoned his office and spoke with staff who reported they are not able to fit in for an appt and deferred her to go to a walk-in clinic. PT phoned pt and explained to her that both of her doctors have referred for her to go to the walk in clinic. She agreed and voiced she will [facility] [place] this evening with her husband.

## 2017-12-03 NOTE — ED Provider Notes (Signed)
[facility]    CSN: [id number] Arrival date & time: 12/03/17  1701  History   Chief Complaint Chief Complaint  Patient presents with  . Shoulder Pain   HPI  67 year old female presents with shoulder pain/arm pain.  Patient states that on Thursday she was shopping and suddenly developed a sharp pain in her left shoulder/axilla.  Pain radiated down her arm and also radiated upwards to her neck and jaw.  Lasted for 20 to 30 minutes.  No reports of chest pain.  She states that she has some mild shortness of breath which appears to be at her baseline.  She has no pain currently.  She states that she has had no issues since that time.  She informed her physical therapist to have this and was urged to come in for evaluation.  Patient essentially has no complaints at this time.  No reports of associated diaphoresis with her prior episode of pain.  No other associated symptoms.  No other concerns at this time.   Past Medical History:  Diagnosis Date  . Arthritis   . Chicken pox   . Cholecystitis 11/2011   Did not require sgy - Dr. Staci Acosta - [facility]  (cholelithiasis)  . H/O Clostridium difficile infection   . IBS (irritable bowel syndrome)   . MRSA exposure 2005   Spider bite  . MVP (mitral valve prolapse)    Stable - Dr. Ubaldo Glassing  . Rheumatic fever     Patient Active Problem List   Diagnosis Date Noted  . Itching 07/21/2017  . Asthma 07/16/2016  . Urinary incontinence 07/16/2016  . SOB (shortness of breath) 04/01/2016  . Bilateral shoulder pain 02/24/2016  . Neck fullness 01/28/2016  . Routine general medical examination at a health care facility 07/25/2015  . Health care maintenance 10/09/2014  . Neck pain 11/26/2013  . Hypercholesterolemia 11/26/2013  . History of colonic polyps 05/11/2013  . Hyperbilirubinemia 01/18/2013  . GERD (gastroesophageal reflux disease) 12/03/2012  . Cholelithiasis 11/07/2012  . IBS (irritable bowel syndrome) 11/07/2012  . History of  rheumatic fever 08/28/2012  . MVP (mitral valve prolapse) 08/28/2012    Past Surgical History:  Procedure Laterality Date  . CATARACT EXTRACTION  [id number]  . MUSCLE BIOPSY      OB History   None      Home Medications    Prior to Admission medications   Medication Sig Start Date End Date Taking? Authorizing Provider  albuterol (PROVENTIL HFA;VENTOLIN HFA) 108 (90 Base) MCG/ACT inhaler Inhale 2 puffs into the lungs every 4 (four) hours as needed for wheezing or shortness of breath. 12/08/16 12/03/17 Yes Melynda Ripple, MD  aspirin 325 MG tablet Take by mouth.   Yes [provider]  fluticasone (FLONASE) 50 MCG/ACT nasal spray Place 1 spray into both nostrils 2 (two) times daily. 10/31/17 09/09/19 Yes Einar Pheasant, MD  Polyethyl Glycol-Propyl Glycol (SYSTANE OP) Apply to eye.   Yes [provider]  sodium chloride (OCEAN) 0.65 % SOLN nasal spray Place 2 sprays into both nostrils every 2 (two) hours while awake. 12/16/15  Yes Betancourt, Aura Fey, NP    Family History Family History  Problem Relation Age of Onset  . Arthritis Mother   . Stroke Mother   . Diabetes Mother   . Hypertension Mother   . Arthritis Father   . Stroke Father   . Diabetes Paternal Grandmother   . Cancer Paternal Uncle        colon  . Heart disease  Unknown        maternal and paternal side  . Breast cancer Paternal Aunt   . Breast cancer Cousin   . Breast cancer Cousin        female cousin    Social History Social History   Tobacco Use  . Smoking status: Never Smoker  . Smokeless tobacco: Never Used  Substance Use Topics  . Alcohol use: Yes    Alcohol/week: 0.0 oz    Comment: Rarely  . Drug use: No     Allergies   Other; Adhesive [tape]; Tartrazine; and Yellow dyes (non-tartrazine)   Review of Systems Review of Systems  Respiratory: Positive for shortness of breath.   Cardiovascular: Negative for chest pain.  Musculoskeletal:       Left shoulder/arm pain   Physical  Exam Triage Vital Signs ED Triage Vitals [12/03/17 1713]  Enc Vitals Group     BP (!) 109/56     Pulse Rate 80     Resp 16     Temp 98.5 F (36.9 C)     Temp Source Oral     SpO2 99 %     Weight 155 lb (70.3 kg)     Height 5' 7.5" (1.715 m)     Head Circumference      Peak Flow      Pain Score 0     Pain Loc      Pain Edu?      Excl. in [place]?    Updated Vital Signs BP (!) 109/56 (BP Location: Left Arm)   Pulse 80   Temp 98.5 F (36.9 C) (Oral)   Resp 16   Ht 5' 7.5" (1.715 m)   Wt 155 lb (70.3 kg)   SpO2 99%   BMI 23.92 kg/m   Physical Exam  Constitutional: She is oriented to person, place, and time. She appears well-developed and well-nourished. No distress.  Cardiovascular: Normal rate and regular rhythm.  Pulmonary/Chest: Effort normal and breath sounds normal. She has no wheezes. She has no rales.  Abdominal: Soft. She exhibits no distension.  Musculoskeletal:  Normal range of motion of the left shoulder.  Negative Hawkins.  Neurological: She is alert and oriented to person, place, and time.  Psychiatric: She has a normal mood and affect. Her behavior is normal.  Nursing note and vitals reviewed.  UC Treatments / Results  Labs (all labs ordered are listed, but only abnormal results are displayed) Labs Reviewed - No data to display  EKG Interpretation: Normal sinus rhythm with rate of 66.  Normal axis.  Normal intervals.  No ST-T wave changes.  Normal EKG.  Radiology No results found.  Procedures Procedures (including critical care time)  Medications Ordered in UC Medications - No data to display  Initial Impression / Assessment and Plan / UC Course  I have reviewed the triage vital signs and the nursing notes.  Pertinent labs & imaging results that were available during my care of the patient were reviewed by me and considered in my medical decision making (see chart for details).    67 year old female presents with a single episode of left arm pain.   There is no evidence that this is cardiac in nature.  Her EKG is reassuring today.  She has had a normal stress echo in August of last year.  She is well-appearing.  She is in no distress at this time.  She is currently pain-free.  Possibly musculoskeletal or radicular in nature.  Advise close  follow-up with primary care.  Okay to resume participation in physical therapy.  Final Clinical Impressions(s) / UC Diagnoses   Final diagnoses:  Left arm pain     Discharge Instructions     EKG and exam were unremarkable.  Possibly related to cervical radiculopathy.  If recurs, please talk with your primary.  This does not appear to be cardiac in nature.    Take care  Dr. Lacinda Axon    ED Prescriptions    None     Controlled Substance Prescriptions [place] Controlled Substance Registry consulted? Not Applicable  Coral Spikes, DO 12/03/17 1831

## 2017-12-03 NOTE — Telephone Encounter (Signed)
Where would you like pt to be scheduled

## 2017-12-03 NOTE — Telephone Encounter (Signed)
Noted.  Hold and f/u and confirm pt was evaluated.

## 2017-12-03 NOTE — Telephone Encounter (Signed)
Need to clarify original message.  If she is having dizziness and arm pain, etc, needs to go ahead and be seen today - instead of waiting for work in appt.

## 2017-12-03 NOTE — Telephone Encounter (Signed)
Please see previous notes.

## 2017-12-04 NOTE — Telephone Encounter (Signed)
Pt was evaluated at [facility]

## 2017-12-08 ENCOUNTER — Ambulatory Visit: Payer: BLUE CROSS/BLUE SHIELD | Admitting: Physical Therapy

## 2017-12-08 DIAGNOSIS — R279 Unspecified lack of coordination: Secondary | ICD-10-CM | POA: Diagnosis present

## 2017-12-08 DIAGNOSIS — M4125 Other idiopathic scoliosis, thoracolumbar region: Secondary | ICD-10-CM

## 2017-12-08 DIAGNOSIS — M791 Myalgia, unspecified site: Secondary | ICD-10-CM | POA: Diagnosis present

## 2017-12-08 DIAGNOSIS — R293 Abnormal posture: Secondary | ICD-10-CM | POA: Diagnosis present

## 2017-12-08 DIAGNOSIS — M533 Sacrococcygeal disorders, not elsewhere classified: Secondary | ICD-10-CM | POA: Diagnosis present

## 2017-12-08 DIAGNOSIS — M6281 Muscle weakness (generalized): Secondary | ICD-10-CM | POA: Diagnosis present

## 2017-12-08 DIAGNOSIS — M25551 Pain in right hip: Secondary | ICD-10-CM | POA: Diagnosis present

## 2017-12-09 NOTE — Patient Instructions (Addendum)
Contact primary care physician about sleep study  Provided research articles on sleep apnea and nocturia   __________  Medical [facility] Therapy Address: [address] [place], [place], [place] [address]  [phone]  _____________  [email or web address] Location  [phone] [address] [place], [place] [address]   _____________ [email or web address]

## 2017-12-09 NOTE — Therapy (Addendum)
[place] MAIN [facility] SERVICES [address] [place], [place], [address] Phone: [phone]   Fax:  [phone]  Physical Therapy Treatment  Patient Details  Name: Lynn Mann MRN: [id number] Date of Birth: 10-27-1950 Referring Provider: Einar Pheasant, MD    Encounter Date: 12/08/2017  PT End of Session - 12/08/17 1618    Visit Number  3    Number of Visits  12    Date for PT Re-Evaluation  02/10/18    PT Start Time  3664    PT Stop Time  1505    PT Time Calculation (min)  58 min    Activity Tolerance  Patient tolerated treatment well    Behavior During Therapy  [facility] for tasks assessed/performed       Past Medical History:  Diagnosis Date  . Arthritis   . Chicken pox   . Cholecystitis 11/2011   Did not require sgy - Dr. Staci Acosta - [facility]  (cholelithiasis)  . H/O Clostridium difficile infection   . IBS (irritable bowel syndrome)   . MRSA exposure 2005   Spider bite  . MVP (mitral valve prolapse)    Stable - Dr. Ubaldo Glassing  . Rheumatic fever     Past Surgical History:  Procedure Laterality Date  . CATARACT EXTRACTION  [id number]  . MUSCLE BIOPSY      There were no vitals filed for this visit.  Subjective Assessment - 12/08/17 1432    Subjective  Pt reported she went to the Urgent Care and her EKG was neg. The doctor at the ER told her she is okay to do PT. Pt wakes up every night for the past 5 nights at 4:45 am with need to go to the bathroom and she has leakage before getting there.   Pt had one day/night last week where she had heavy leakage going through 6-7 Depends. The other days, pt has no leakage at all in the day and at night, just a little bit.       Pertinent History  Pt had a colonscopy which showed internal hemorrhoids, polyp that were benign and removed. Pt had lost 40 lb between May 2018 to current. Pt has felt she lost muscle mass through her weight loss which she did not following any diets. Pt is now trying has enough  protein for build muscles but she has not worked with a nutritionist.            [facility] PT Assessment - 12/09/17 1140      Observation/Other Assessments   Observations  brighter affect       Sit to Stand   Comments  good carry over from last session                Pelvic Floor Special Questions - 12/08/17 1618    Pelvic Floor Internal Exam  pt consented verbally without contraindications    Exam Type  Vaginal    Palpation   perineal mm tightness/ scar restrictions L >R 5-8 o clock 2-3rd layers , with referred pain towards anus, R pubic      Strength  good squeeze, good lift, able to hold agaisnt strong resistance        [facility] Adult PT Treatment/Exercise - 12/09/17 1140      Therapeutic Activites    Therapeutic Activities  -- information about OSA, nocturia, research      Manual Therapy   Internal Pelvic Floor  deep perineal tranverse R STM, MWM  PT Long Term Goals - 11/20/17 1128      PT LONG TERM GOAL #1   Title  Pt will decrease her PFDI score from 47% to < 42% in order to improve pelvic floor function     Time  8    Period  Weeks    Status  New    Target Date  01/15/18      PT LONG TERM GOAL #2   Title  Pt will report compliance with proper toileting technique and not squatting/ hovering over the toilet in public restrooms in order to promote pelvic health    Time  2    Period  Weeks    Status  New    Target Date  12/04/17      PT LONG TERM GOAL #3   Title  Pt will demo IND with pelvic floor exercises     Time  12    Period  Weeks    Status  New    Target Date  01/15/18      PT LONG TERM GOAL #4   Title  Pt will be referred to community fitness classes a senior center for overall strengthening     Time  4    Period  Weeks    Status  New    Target Date  12/18/17      PT LONG TERM GOAL #5   Title  Pt will improve her bowel movements from every 3 days to daily and Stool Type  Bristol Scale from  Type 4 ( 50%) and Type  7 ( 50%) to Type 4 ( 75% of the time) in order to improve GI/ pelvic function     Time  8    Period  Weeks    Status  New    Target Date  01/15/18            Plan - 12/08/17 1619    Clinical Impression Statement  Pt showed decreased perineal scar restrictions and is starting to proper lengthening of her pelvic floor. Anticipate this will continue to help her with her incontinence issue but will continue to monitor if her excessive/inconsistent leakage may be more than just a musculoskeletal issue. Pt's report of nocturia is a concern and possibly related to her fatigue level. Pt would benefit from a sleep study to rule in/ out OSA in order to improve sleep quality and associated health benefits.  Pt was provided the research articles to understand the association between nocturia and OSA.   Pt reports her sleep gets interrupted and her husband also snores. Pt also has had a behavior pattern of being hyper vigilant because she needed to be this way in the night while caring for her ailing parents. This behavior will be addressed with the plan to teach pt relaxation practices to help pt fall back asleep. Pt was provided the research below to understand the association between nocturia and OSA.   * Update on her visit to the Urgent Care. Pt was informed that her EKG was negative and the MD cleared her for PT. Please review her note in her chart.       PT Frequency  1x / week    PT Duration  12 weeks    PT Treatment/Interventions  Neuromuscular re-education;Balance training;Therapeutic exercise;Orthotic Fit/Training;Functional mobility training;Moist Heat;Traction;Therapeutic activities;Patient/family education;Manual techniques    Consulted and Agree with Plan of Care  Patient       Patient will benefit from skilled therapeutic intervention  in order to improve the following deficits and impairments:  Postural dysfunction, Increased muscle spasms, Decreased scar mobility, Decreased coordination,  Decreased mobility, Decreased range of motion, Decreased endurance, Decreased activity tolerance, Difficulty walking, Decreased safety awareness, Decreased balance, Abnormal gait, Decreased strength, Hypomobility, Improper body mechanics  Visit Diagnosis: Myalgia  Unspecified lack of coordination  Muscle weakness (generalized)  Abnormal posture  Other idiopathic scoliosis, thoracolumbar region     Problem List Patient Active Problem List   Diagnosis Date Noted  . Itching 07/21/2017  . Asthma 07/16/2016  . Urinary incontinence 07/16/2016  . SOB (shortness of breath) 04/01/2016  . Bilateral shoulder pain 02/24/2016  . Neck fullness 01/28/2016  . Routine general medical examination at a health care facility 07/25/2015  . Health care maintenance 10/09/2014  . Neck pain 11/26/2013  . Hypercholesterolemia 11/26/2013  . History of colonic polyps 05/11/2013  . Hyperbilirubinemia 01/18/2013  . GERD (gastroesophageal reflux disease) 12/03/2012  . Cholelithiasis 11/07/2012  . IBS (irritable bowel syndrome) 11/07/2012  . History of rheumatic fever 08/28/2012  . MVP (mitral valve prolapse) 08/28/2012    Jerl Mina ,PT, DPT, E-RYT  12/09/2017, 8:14 PM  [place] MAIN [facility] SERVICES [address] [place], [place], [address] Phone: [phone]   Fax:  [phone]  Name: SHONICE WRISLEY MRN: [id number] Date of Birth: 1951/05/04

## 2017-12-16 ENCOUNTER — Ambulatory Visit: Payer: BLUE CROSS/BLUE SHIELD | Admitting: Physical Therapy

## 2017-12-16 DIAGNOSIS — M791 Myalgia, unspecified site: Secondary | ICD-10-CM

## 2017-12-16 DIAGNOSIS — M25551 Pain in right hip: Secondary | ICD-10-CM

## 2017-12-16 DIAGNOSIS — R279 Unspecified lack of coordination: Secondary | ICD-10-CM | POA: Diagnosis not present

## 2017-12-16 DIAGNOSIS — M4125 Other idiopathic scoliosis, thoracolumbar region: Secondary | ICD-10-CM

## 2017-12-16 DIAGNOSIS — M6281 Muscle weakness (generalized): Secondary | ICD-10-CM

## 2017-12-16 DIAGNOSIS — R293 Abnormal posture: Secondary | ICD-10-CM

## 2017-12-16 DIAGNOSIS — M533 Sacrococcygeal disorders, not elsewhere classified: Secondary | ICD-10-CM

## 2017-12-16 NOTE — Therapy (Signed)
[place] MAIN [facility] SERVICES [address] [place], [place], [address] Phone: [phone]   Fax:  [phone]  Physical Therapy Treatment  Patient Details  Name: Lynn Mann MRN: [id number] Date of Birth: Jun 19, 1951 Referring Provider: Einar Pheasant, MD    Encounter Date: 12/16/2017  PT End of Session - 12/16/17 1515    Visit Number  4  (Pended)     Number of Visits  12  (Pended)     Date for PT Re-Evaluation  02/10/18  (Pended)     PT Start Time  1410  (Pended)     PT Stop Time  1509  (Pended)     PT Time Calculation (min)  59 min  (Pended)     Activity Tolerance  Patient tolerated treatment well  (Pended)     Behavior During Therapy  [facility] for tasks assessed/performed  (Pended)        Past Medical History:  Diagnosis Date  . Arthritis   . Chicken pox   . Cholecystitis 11/2011   Did not require sgy - Dr. Staci Acosta - [facility]  (cholelithiasis)  . H/O Clostridium difficile infection   . IBS (irritable bowel syndrome)   . MRSA exposure 2005   Spider bite  . MVP (mitral valve prolapse)    Stable - Dr. Ubaldo Glassing  . Rheumatic fever     Past Surgical History:  Procedure Laterality Date  . CATARACT EXTRACTION  [id number]  . MUSCLE BIOPSY      There were no vitals filed for this visit.  Subjective Assessment - 12/16/17 1415    Subjective  Pt reports she is doing her exercises. She had a question about side-stepping exercise. Pt is still getting used to not hovering over the toilet in public places. Pt has leakage by the time she places a seat cover on the toilet and sits down. Pt also reports it is a habit to hover and be in hurry.  BOwel movements are regular and with less straining. Once a night, pt has had heavy leakage which is an improvement despite getting up 3 x night. Before, she was getting up to change heavy 3-4 wet diapers/ night.  Her heavy leakage is mostly at night.      Pertinent History  Pt had a colonscopy which showed internal  hemorrhoids, polyp that were benign and removed. Pt had lost 40 lb between May 2018 to current. Pt has felt she lost muscle mass through her weight loss which she did not following any diets. Pt is now trying has enough protein for build muscles but she has not worked with a nutritionist.            [facility] PT Assessment - 12/16/17 1514      Observation/Other Assessments   Observations  poor sitting posture. forward head. Cued for postural alignment       Squat   Comments  anterior COM with knees over feet       Sit to Stand   Comments  good carry over from last session                Pelvic Floor Special Questions - 12/16/17 1425    Pelvic Floor Internal Exam  pt consented verbally without contraindications    Exam Type  Vaginal    Palpation   perineal mm tightness/ scar restrictions 6-7 o clock     Strength  good squeeze, good lift, able to hold agaisnt strong resistance  [facility] Adult PT Treatment/Exercise - 12/16/17 1546      Neuro Re-ed    Neuro Re-ed Details   see pt instructions      Manual Therapy   Internal Pelvic Floor  deep perineal tranverse R STM, MWM                   PT Long Term Goals - 11/20/17 1128      PT LONG TERM GOAL #1   Title  Pt will decrease her PFDI score from 47% to < 42% in order to improve pelvic floor function     Time  8    Period  Weeks    Status  New    Target Date  01/15/18      PT LONG TERM GOAL #2   Title  Pt will report compliance with proper toileting technique and not squatting/ hovering over the toilet in public restrooms in order to promote pelvic health    Time  2    Period  Weeks    Status  New    Target Date  12/04/17      PT LONG TERM GOAL #3   Title  Pt will demo IND with pelvic floor exercises     Time  12    Period  Weeks    Status  New    Target Date  01/15/18      PT LONG TERM GOAL #4   Title  Pt will be referred to community fitness classes a senior center for overall strengthening      Time  4    Period  Weeks    Status  New    Target Date  12/18/17      PT LONG TERM GOAL #5   Title  Pt will improve her bowel movements from every 3 days to daily and Stool Type  Bristol Scale from  Type 4 ( 50%) and Type 7 ( 50%) to Type 4 ( 75% of the time) in order to improve GI/ pelvic function     Time  8    Period  Weeks    Status  New    Target Date  01/15/18            Plan - 12/16/17 1430    Clinical Impression Statement  Pt is making progress with less number of heavily wet diapers from 3-4x night to 1x night. Pt demo'd improved pelvic floor lengthening and proper contraction of pelvic floor post Tx which was focused on decreasing remaining scar restrictions. Pt reported back pain following exercises and PT plans to address scoliosis specific HEP to minimize this complaint at upcoming sessions. Pt provided a stretch today to mitigate her LBP.  Pt also required alginment cues for minisquat. Pt demo'd good carry over with sit to stand with less genu valgus but required cues for cervical retraction to minimize forward head. Pt contiues to benefit from skilled PT    PT Frequency  1x / week    PT Duration  12 weeks    PT Treatment/Interventions  Neuromuscular re-education;Balance training;Therapeutic exercise;Orthotic Fit/Training;Functional mobility training;Moist Heat;Traction;Therapeutic activities;Patient/family education;Manual techniques    Consulted and Agree with Plan of Care  Patient       Patient will benefit from skilled therapeutic intervention in order to improve the following deficits and impairments:  Postural dysfunction, Increased muscle spasms, Decreased scar mobility, Decreased coordination, Decreased mobility, Decreased range of motion, Decreased endurance, Decreased activity tolerance, Difficulty walking, Decreased safety  awareness, Decreased balance, Abnormal gait, Decreased strength, Hypomobility, Improper body mechanics  Visit  Diagnosis: Myalgia  Muscle weakness (generalized)  Abnormal posture  Other idiopathic scoliosis, thoracolumbar region  Sacrococcygeal disorders, not elsewhere classified  Pain in right hip  Unspecified lack of coordination     Problem List Patient Active Problem List   Diagnosis Date Noted  . Itching 07/21/2017  . Asthma 07/16/2016  . Urinary incontinence 07/16/2016  . SOB (shortness of breath) 04/01/2016  . Bilateral shoulder pain 02/24/2016  . Neck fullness 01/28/2016  . Routine general medical examination at a health care facility 07/25/2015  . Health care maintenance 10/09/2014  . Neck pain 11/26/2013  . Hypercholesterolemia 11/26/2013  . History of colonic polyps 05/11/2013  . Hyperbilirubinemia 01/18/2013  . GERD (gastroesophageal reflux disease) 12/03/2012  . Cholelithiasis 11/07/2012  . IBS (irritable bowel syndrome) 11/07/2012  . History of rheumatic fever 08/28/2012  . MVP (mitral valve prolapse) 08/28/2012    Jerl Mina ,PT, DPT, E-RYT  12/16/2017, 3:49 PM  [place] MAIN [facility] SERVICES [address] [place], [place], [address] Phone: [phone]   Fax:  [phone]  Name: Lynn Mann MRN: [id number] Date of Birth: 07/27/1951

## 2017-12-24 ENCOUNTER — Ambulatory Visit: Payer: BLUE CROSS/BLUE SHIELD | Admitting: Physical Therapy

## 2017-12-24 DIAGNOSIS — R279 Unspecified lack of coordination: Secondary | ICD-10-CM | POA: Diagnosis not present

## 2017-12-24 DIAGNOSIS — M4125 Other idiopathic scoliosis, thoracolumbar region: Secondary | ICD-10-CM

## 2017-12-24 DIAGNOSIS — R293 Abnormal posture: Secondary | ICD-10-CM

## 2017-12-24 DIAGNOSIS — M791 Myalgia, unspecified site: Secondary | ICD-10-CM

## 2017-12-24 DIAGNOSIS — M25551 Pain in right hip: Secondary | ICD-10-CM

## 2017-12-24 DIAGNOSIS — M6281 Muscle weakness (generalized): Secondary | ICD-10-CM

## 2017-12-24 DIAGNOSIS — M533 Sacrococcygeal disorders, not elsewhere classified: Secondary | ICD-10-CM

## 2017-12-24 NOTE — Patient Instructions (Addendum)
"  Walk away pulling" with two bands over door   ski track stance, toes point forward Hand bands by hips, squeezing armpits and shoulder blades down and back  Slow forward walking, landing on midfoot  X 3-4 steps  Slow backward walking, ballmound down, then slow lowering of heels  Center of mass is slightly leaned forward    3 min x 3x day     ______   Lynn Mann the awareness of pelvic lift on exhale during other exercises   _____  To keep ankles from dropping:  ankle eversion strengthening with blue band  Band on ballmound of R foot, L foot on band to create tension, hold in L hand   Toes up, sweep pinky toe out against band resistance 15 x 2  Each leg   _____  [email or web address]

## 2017-12-26 NOTE — Therapy (Signed)
[place] MAIN [facility] SERVICES [place], [place], [address] Phone: [phone]   Fax:  [phone]  Physical Therapy Treatment  Patient Details  Name: Lynn Mann MRN: [id number] Date of Birth: 02-13-1951 Referring Provider: Einar Pheasant, MD    Encounter Date: 12/24/2017    Past Medical History:  Diagnosis Date  . Arthritis   . Chicken pox   . Cholecystitis 11/2011   Did not require sgy - Dr. Staci Acosta - [facility]  (cholelithiasis)  . H/O Clostridium difficile infection   . IBS (irritable bowel syndrome)   . MRSA exposure 2005   Spider bite  . MVP (mitral valve prolapse)    Stable - Dr. Ubaldo Glassing  . Rheumatic fever     Past Surgical History:  Procedure Laterality Date  . CATARACT EXTRACTION  [id number]  . MUSCLE BIOPSY      There were no vitals filed for this visit.  Subjective Assessment - 12/26/17 0128    Subjective  Pt reports her leakage is better in the day time and still some incidents in the nighttime. This past week, she had to change her pads 2x a night instead of 4 x night.      Pertinent History  Pt had a colonscopy which showed internal hemorrhoids, polyp that were benign and removed. Pt had lost 40 lb between May 2018 to current. Pt has felt she lost muscle mass through her weight loss which she did not following any diets. Pt is now trying has enough protein for build muscles but she has not worked with a nutritionist.            [facility] PT Assessment - 12/26/17 0129      Observation/Other Assessments   Observations  severe R ankle pronation        Coordination   Gross Motor Movements are Fluid and Coordinated  -- poor propioception with backward stepping                   [facility] Adult PT Treatment/Exercise - 12/26/17 0129      Neuro Re-ed    Neuro Re-ed Details   see pt instructions      Manual Therapy   Internal Pelvic Floor   STM, MWM at probelm areas                   PT  Long Term Goals - 11/20/17 1128      PT LONG TERM GOAL #1   Title  Pt will decrease her PFDI score from 47% to < 42% in order to improve pelvic floor function     Time  8    Period  Weeks    Status  New    Target Date  01/15/18      PT LONG TERM GOAL #2   Title  Pt will report compliance with proper toileting technique and not squatting/ hovering over the toilet in public restrooms in order to promote pelvic health    Time  2    Period  Weeks    Status  New    Target Date  12/04/17      PT LONG TERM GOAL #3   Title  Pt will demo IND with pelvic floor exercises     Time  12    Period  Weeks    Status  New    Target Date  01/15/18      PT LONG TERM GOAL #4  Title  Pt will be referred to community fitness classes a senior center for overall strengthening     Time  4    Period  Weeks    Status  New    Target Date  12/18/17      PT LONG TERM GOAL #5   Title  Pt will improve her bowel movements from every 3 days to daily and Stool Type  Bristol Scale from  Type 4 ( 50%) and Type 7 ( 50%) to Type 4 ( 75% of the time) in order to improve GI/ pelvic function     Time  8    Period  Weeks    Status  New    Target Date  01/15/18            Plan - 12/26/17 0130    Clinical Impression Statement  Pt is progressing well with decreasing pelvic scar restrictions and increased pelvic floor contraction strength and coordination. Progressed to lower kinetic chain and ankle eversion strengthening due to pt's weakness and feet pronation. Pt continues to benefit from skilled PT. Pt tolerated today's session without complaints. Pt required propioception training for feet to pelvis placement. Pt demo'd improvement after training.     PT Frequency  1x / week    PT Duration  12 weeks    PT Treatment/Interventions  Neuromuscular re-education;Balance training;Therapeutic exercise;Orthotic Fit/Training;Functional mobility training;Moist Heat;Traction;Therapeutic activities;Patient/family  education;Manual techniques    Consulted and Agree with Plan of Care  Patient       Patient will benefit from skilled therapeutic intervention in order to improve the following deficits and impairments:  Postural dysfunction, Increased muscle spasms, Decreased scar mobility, Decreased coordination, Decreased mobility, Decreased range of motion, Decreased endurance, Decreased activity tolerance, Difficulty walking, Decreased safety awareness, Decreased balance, Abnormal gait, Decreased strength, Hypomobility, Improper body mechanics  Visit Diagnosis: Myalgia  Muscle weakness (generalized)  Abnormal posture  Other idiopathic scoliosis, thoracolumbar region  Sacrococcygeal disorders, not elsewhere classified  Pain in right hip     Problem List Patient Active Problem List   Diagnosis Date Noted  . Itching 07/21/2017  . Asthma 07/16/2016  . Urinary incontinence 07/16/2016  . SOB (shortness of breath) 04/01/2016  . Bilateral shoulder pain 02/24/2016  . Neck fullness 01/28/2016  . Routine general medical examination at a health care facility 07/25/2015  . Health care maintenance 10/09/2014  . Neck pain 11/26/2013  . Hypercholesterolemia 11/26/2013  . History of colonic polyps 05/11/2013  . Hyperbilirubinemia 01/18/2013  . GERD (gastroesophageal reflux disease) 12/03/2012  . Cholelithiasis 11/07/2012  . IBS (irritable bowel syndrome) 11/07/2012  . History of rheumatic fever 08/28/2012  . MVP (mitral valve prolapse) 08/28/2012    Jerl Mina ,PT, DPT, E-RYT  12/26/2017, 1:32 AM  [place] MAIN [facility] SERVICES [address] [place], [place], [address] Phone: [phone]   Fax:  [phone]  Name: Lynn Mann MRN: [id number] Date of Birth: [birth year]

## 2017-12-30 ENCOUNTER — Ambulatory Visit: Payer: BLUE CROSS/BLUE SHIELD | Attending: Internal Medicine | Admitting: Physical Therapy

## 2017-12-30 DIAGNOSIS — M4125 Other idiopathic scoliosis, thoracolumbar region: Secondary | ICD-10-CM | POA: Diagnosis present

## 2017-12-30 DIAGNOSIS — M533 Sacrococcygeal disorders, not elsewhere classified: Secondary | ICD-10-CM | POA: Insufficient documentation

## 2017-12-30 DIAGNOSIS — M791 Myalgia, unspecified site: Secondary | ICD-10-CM | POA: Insufficient documentation

## 2017-12-30 DIAGNOSIS — M25551 Pain in right hip: Secondary | ICD-10-CM | POA: Insufficient documentation

## 2017-12-30 DIAGNOSIS — M6281 Muscle weakness (generalized): Secondary | ICD-10-CM

## 2017-12-30 DIAGNOSIS — R293 Abnormal posture: Secondary | ICD-10-CM | POA: Insufficient documentation

## 2017-12-30 NOTE — Therapy (Addendum)
[place] MAIN [facility] SERVICES [place], [place], [address] Phone: [phone]   Fax:  [phone]  Physical Therapy Treatment / Progress Note   Patient Details  Name: Lynn Mann MRN: [id number] Date of Birth: 1951/01/05 Referring Provider: Einar Pheasant, MD    Encounter Date: 12/30/2017  PT End of Session - 12/30/17 1508    Visit Number  6    Number of Visits  12    Date for PT Re-Evaluation  02/10/18    PT Start Time  5597    PT Stop Time  1508    PT Time Calculation (min)  63 min    Activity Tolerance  Patient tolerated treatment well    Behavior During Therapy  [facility] for tasks assessed/performed       Past Medical History:  Diagnosis Date  . Arthritis   . Chicken pox   . Cholecystitis 11/2011   Did not require sgy - Dr. Staci Acosta - [facility]  (cholelithiasis)  . H/O Clostridium difficile infection   . IBS (irritable bowel syndrome)   . MRSA exposure 2005   Spider bite  . MVP (mitral valve prolapse)    Stable - Dr. Ubaldo Glassing  . Rheumatic fever     Past Surgical History:  Procedure Laterality Date  . CATARACT EXTRACTION  [id number]  . MUSCLE BIOPSY      There were no vitals filed for this visit.  Subjective Assessment - 12/30/17 1408    Subjective  Pt had 2 bad days with urinary leakage during the day and night  last week.  Pt had worsened leakage and pressure in her abdomen after Pt lifted her granddtr who weights > 20 lbs.   Pt went to chair yoga classs at a community yoga studio and she had problems. pt is still getting at night to urinate 1x/ night but without leakage. The diapers use during the night has decreased from 5-6 to 2-3 per night. Pt has not done her new PT exercises last week.       Pertinent History  Pt had a colonscopy which showed internal hemorrhoids, polyp that were benign and removed. Pt had lost 40 lb between May 2018 to current. Pt has felt she lost muscle mass through her weight loss which she did  not following any diets. Pt is now trying has enough protein for build muscles but she has not worked with a nutritionist.            [facility] PT Assessment - 12/30/17 1447      Observation/Other Assessments   Observations  god carry over with less pronation in minisquats and previous HEP but not in gait                 Pelvic Floor Special Questions - 12/30/17 1506    Prolapse  Anterior Wall slightly at pubic symphysis,able to achieve PFM closure    Pelvic Floor Internal Exam  pt consented verbally without contraindications    Exam Type  Vaginal    Palpation  no tightness in pelvic floor      Strength  good squeeze, good lift, able to hold agaisnt strong resistance achieved increased contraction sequential/ circumferential                 PT Education - 12/30/17 1508    Education provided  Yes    Education Details  HEP    Person(s) Educated  Patient    Methods  Explanation;Demonstration;Tactile  cues;Verbal cues;Handout    Comprehension  Returned demonstration;Verbalized understanding          PT Long Term Goals - 12/30/17 1442      PT LONG TERM GOAL #1   Title  Pt will decrease her PFDI score from 47% to < 42% in order to improve pelvic floor function ( 6/4/: 36%)     Time  8    Period  Weeks    Status  Achieved      PT LONG TERM GOAL #2   Title  Pt will report compliance with proper toileting technique and not squatting/ hovering over the toilet in public restrooms in order to promote pelvic health    Time  2    Period  Weeks    Status  Partially Met      PT LONG TERM GOAL #3   Title  Pt will demo IND with pelvic floor exercises     Time  12    Period  Weeks    Status  On-going      PT LONG TERM GOAL #4   Title  Pt will be referred to community fitness classes a senior center for overall strengthening     Time  4    Period  Weeks    Status  Achieved      PT LONG TERM GOAL #5   Title  Pt will improve her bowel movements from every 3 days to  daily and Stool Type  Bristol Scale from  Type 4 ( 50%) and Type 7 ( 50%) to Type 4 ( 75% of the time) in order to improve GI/ pelvic function   ( 6/4: every other day, Type 4 ( 75%)     Time  8    Period  Weeks    Status  Partially Met      Additional Long Term Goals   Additional Long Term Goals  Yes      PT LONG TERM GOAL #6   Title  Pt will demo less pronation in B with gait and mini squats in order to miminize risk for knee and pelvic floor dysfunctions    Time  8    Period  Weeks    Status  New    Target Date  02/24/18            Plan - 12/30/17 1413    Clinical Impression Statement Pt is progressing well, achieving 2/6 goals. Pt's PFDI score decreased significantly from 47% to 36% which indicates improved pelvic floor function. She has decreased wearing of urinary diapers during the night from 5-6 to 2-3 per night. Pt's bowel movements are now occurring every other day and not every 3 days. Her stool consistency has improved to Type 4 with  less diarrhea. Plan to continue addressing pt's scoliosis,  leg length difference, and pelvic floor /UE/LE strength to help pt advance towards her goals. Today, pt showed no more perineal scar restrictions nor pelvic floor tightness with improved pelvic floor contraction strength and coordination.  Simplified HEP to promote more compliance and advanced quick pelvic floor contractions in upright position. Pt reported ache in R glut/ SIJ area following HEP and thus, this will be addressed at upcoming session. Suspect this ache is likely associated with scoliosis/ leg length difference.  While pt is making improvement with decreased urinary leakage, pt is still getting up in the middle of the night to urinate several times a night.  Pt would benefit from a  sleep study to rule in/ out OSA in order to improve sleep quality and associated health benefits.  Pt was provided the research articles to understand the association between nocturia and OSA.   Pt  reports her sleep gets interrupted and her husband also snores. Pt also has had a behavior pattern of being hyper vigilant because she needed to be this way in the night while caring for her ailing parents. This behavior will be addressed with the plan to teach pt relaxation practices to help pt fall back asleep. Pt was provided the research to understand the association between nocturia and OSA.   * Update on her visit to the Urgent Care for L radiating arm/chest pain/ fatigue. Pt was informed that her EKG was negative and the MD cleared her for PT on 12/03/17 .   Pt continues to benefit from skilled PT.    PT Frequency  1x / week    PT Duration  12 weeks    PT Treatment/Interventions  Neuromuscular re-education;Balance training;Therapeutic exercise;Orthotic Fit/Training;Functional mobility training;Moist Heat;Traction;Therapeutic activities;Patient/family education;Manual techniques    Consulted and Agree with Plan of Care  Patient       Patient will benefit from skilled therapeutic intervention in order to improve the following deficits and impairments:  Postural dysfunction, Increased muscle spasms, Decreased scar mobility, Decreased coordination, Decreased mobility, Decreased range of motion, Decreased endurance, Decreased activity tolerance, Difficulty walking, Decreased safety awareness, Decreased balance, Abnormal gait, Decreased strength, Hypomobility, Improper body mechanics  Visit Diagnosis: Myalgia  Muscle weakness (generalized)  Abnormal posture  Other idiopathic scoliosis, thoracolumbar region  Sacrococcygeal disorders, not elsewhere classified  Pain in right hip     Problem List Patient Active Problem List   Diagnosis Date Noted  . Itching 07/21/2017  . Asthma 07/16/2016  . Urinary incontinence 07/16/2016  . SOB (shortness of breath) 04/01/2016  . Bilateral shoulder pain 02/24/2016  . Neck fullness 01/28/2016  . Routine general medical examination at a health care  facility 07/25/2015  . Health care maintenance 10/09/2014  . Neck pain 11/26/2013  . Hypercholesterolemia 11/26/2013  . History of colonic polyps 05/11/2013  . Hyperbilirubinemia 01/18/2013  . GERD (gastroesophageal reflux disease) 12/03/2012  . Cholelithiasis 11/07/2012  . IBS (irritable bowel syndrome) 11/07/2012  . History of rheumatic fever 08/28/2012  . MVP (mitral valve prolapse) 08/28/2012    Jerl Mina 12/30/2017, 5:47 PM  [place] MAIN [facility] SERVICES [address] [place], [place], [address] Phone: [phone]   Fax:  [phone]  Name: SHABRE KREHER MRN: [id number] Date of Birth: 23-Feb-1951

## 2017-12-30 NOTE — Patient Instructions (Signed)
Exercises consolidated    Morning Ex name Glori Luis Thurs             Fri   Sat Sun     Deep core level 1  breathing             Deep core level 2  + pelvic squeeze ( 6 min)              Clamshells - 20 x                  Ex name Mon Tue Wed Thurs             Fri   Sat Sun     Ski  10 x              Sidestepping + mini squat  3 laps  L and R              Minisquats with quick pelvic squeeze  (focus on lifting foot arch)                  Evening Ex name Mon Tue Wed Thurs             Fri   Sat Sun     Blue band at waist   At door knob Walking forward / backward  6 mi              Figure-4 stretch 5 breaths                Ankle strengthening Foot up and swipe  15 x 2 on each foot

## 2018-01-05 ENCOUNTER — Encounter: Payer: BLUE CROSS/BLUE SHIELD | Admitting: Physical Therapy

## 2018-01-12 ENCOUNTER — Ambulatory Visit: Payer: BLUE CROSS/BLUE SHIELD | Admitting: Physical Therapy

## 2018-01-20 ENCOUNTER — Ambulatory Visit: Payer: BLUE CROSS/BLUE SHIELD | Admitting: Physical Therapy

## 2018-01-22 ENCOUNTER — Encounter: Payer: BLUE CROSS/BLUE SHIELD | Admitting: Physical Therapy

## 2018-01-27 ENCOUNTER — Encounter: Payer: BLUE CROSS/BLUE SHIELD | Admitting: Physical Therapy

## 2018-01-28 ENCOUNTER — Telehealth: Payer: Self-pay | Admitting: *Deleted

## 2018-01-28 NOTE — Telephone Encounter (Signed)
Please place referral to dietician. She does not see a Dr. Annamaria Boots, it looks like physical therapy is the one recommending sleep study. Can refer to [facility] pulmonology for her to have that done.

## 2018-01-28 NOTE — Telephone Encounter (Signed)
Copied from [place] [phone]. Topic: Referral - Request >> Jan 28, 2018  3:49 PM Antonieta Iba C wrote: Reason for CRM: pt says that she and provider discussed pt seeing a dietician. Pt would like to have a referral placed for that.  ALSO, pt says that she she's Dr. Annamaria Boots and she is strongly suggesting that  pt have a sleep study.   Pt would like further assistance.

## 2018-01-30 ENCOUNTER — Emergency Department
Admission: EM | Admit: 2018-01-30 | Discharge: 2018-01-30 | Disposition: A | Discharge status: Home / Self Care | Payer: BLUE CROSS/BLUE SHIELD | Attending: Emergency Medicine | Admitting: Emergency Medicine

## 2018-01-30 ENCOUNTER — Encounter: Payer: Self-pay | Admitting: Emergency Medicine

## 2018-01-30 ENCOUNTER — Other Ambulatory Visit: Payer: Self-pay

## 2018-01-30 DIAGNOSIS — J45909 Unspecified asthma, uncomplicated: Secondary | ICD-10-CM | POA: Diagnosis not present

## 2018-01-30 DIAGNOSIS — Z79899 Other long term (current) drug therapy: Secondary | ICD-10-CM | POA: Diagnosis not present

## 2018-01-30 DIAGNOSIS — K5641 Fecal impaction: Secondary | ICD-10-CM

## 2018-01-30 DIAGNOSIS — K5900 Constipation, unspecified: Secondary | ICD-10-CM | POA: Diagnosis present

## 2018-01-30 NOTE — ED Provider Notes (Signed)
[facility] Emergency Department Provider Note  Time seen: 2:08 AM  I have reviewed the triage vital signs and the nursing notes.   HISTORY  Chief Complaint Constipation    HPI Lynn Mann is a 67 y.o. female with no significant past medical history presents to the emergency department for constipation and rectal pain.  According to the patient she has been experiencing pain in the rectum tonight.  States she feels like she needs to have a bowel movement but nothing is coming out.  Attempted to digitally disimpact herself without success.  Patient states normally she has loose stool she is only been constipated one other time in her life.  States her mother had frequent impactions, and she is concerned that is what is going on tonight.  Does state moderate pain in her rectum, denies any anterior abdominal pain, fever.  States her stool has been a little dark.  Largely negative review of systems otherwise.   Past Medical History:  Diagnosis Date  . Arthritis   . Chicken pox   . Cholecystitis 11/2011   Did not require sgy - Dr. Staci Acosta - [facility]  (cholelithiasis)  . H/O Clostridium difficile infection   . IBS (irritable bowel syndrome)   . MRSA exposure 2005   Spider bite  . MVP (mitral valve prolapse)    Stable - Dr. Ubaldo Glassing  . Rheumatic fever     Patient Active Problem List   Diagnosis Date Noted  . Itching 07/21/2017  . Asthma 07/16/2016  . Urinary incontinence 07/16/2016  . SOB (shortness of breath) 04/01/2016  . Bilateral shoulder pain 02/24/2016  . Neck fullness 01/28/2016  . Routine general medical examination at a health care facility 07/25/2015  . Health care maintenance 10/09/2014  . Neck pain 11/26/2013  . Hypercholesterolemia 11/26/2013  . History of colonic polyps 05/11/2013  . Hyperbilirubinemia 01/18/2013  . GERD (gastroesophageal reflux disease) 12/03/2012  . Cholelithiasis 11/07/2012  . IBS (irritable bowel syndrome) 11/07/2012  .  History of rheumatic fever 08/28/2012  . MVP (mitral valve prolapse) 08/28/2012    Past Surgical History:  Procedure Laterality Date  . CATARACT EXTRACTION  [id number]  . MUSCLE BIOPSY      Prior to Admission medications   Medication Sig Start Date End Date Taking? Authorizing Provider  albuterol (PROVENTIL HFA;VENTOLIN HFA) 108 (90 Base) MCG/ACT inhaler Inhale 2 puffs into the lungs every 4 (four) hours as needed for wheezing or shortness of breath. 12/08/16 12/03/17  Melynda Ripple, MD  aspirin 325 MG tablet Take by mouth.    [provider]  fluticasone (FLONASE) 50 MCG/ACT nasal spray Place 1 spray into both nostrils 2 (two) times daily. 10/31/17 09/09/19  Einar Pheasant, MD  Polyethyl Glycol-Propyl Glycol (SYSTANE OP) Apply to eye.    [provider]  sodium chloride (OCEAN) 0.65 % SOLN nasal spray Place 2 sprays into both nostrils every 2 (two) hours while awake. 12/16/15   Betancourt, Aura Fey, NP    Allergies  Allergen Reactions  . Other Anaphylaxis    Artificial sweetener  . Adhesive [Tape]   . Tartrazine   . Yellow Dyes (Non-Tartrazine) Other (See Comments)    Arthritis pains - it is tartrazine Yellow #5    Family History  Problem Relation Age of Onset  . Arthritis Mother   . Stroke Mother   . Diabetes Mother   . Hypertension Mother   . Arthritis Father   . Stroke Father   . Diabetes Paternal Grandmother   .  Cancer Paternal Uncle        colon  . Heart disease Unknown        maternal and paternal side  . Breast cancer Paternal Aunt   . Breast cancer Cousin   . Breast cancer Cousin        female cousin    Social History Social History   Tobacco Use  . Smoking status: Never Smoker  . Smokeless tobacco: Never Used  Substance Use Topics  . Alcohol use: Yes    Alcohol/week: 0.0 oz    Comment: Rarely  . Drug use: No    Review of Systems Constitutional: Negative for fever. Cardiovascular: Negative for chest pain. Respiratory: Negative for  shortness of breath. Gastrointestinal: Negative for abdominal pain positive for rectal pain.  Positive for constipation.  Negative for vomiting although some nausea. Genitourinary: Negative for urinary compaints Neurological: Negative for headache All other ROS negative  ____________________________________________   PHYSICAL EXAM:  VITAL SIGNS: ED Triage Vitals  Enc Vitals Group     BP 01/30/18 0042 138/73     Pulse Rate 01/30/18 0042 97     Resp --      Temp 01/30/18 0042 98.1 F (36.7 C)     Temp Source 01/30/18 0042 Oral     SpO2 01/30/18 0042 100 %     Weight 01/30/18 0047 148 lb (67.1 kg)     Height 01/30/18 0047 5' 7.75" (1.721 m)     Head Circumference --      Peak Flow --      Pain Score 01/30/18 0047 10     Pain Loc --      Pain Edu? --      Excl. in [place]? --     Constitutional: Alert and oriented. Well appearing and in no distress. Eyes: Normal exam ENT   Head: Normocephalic and atraumatic.   Mouth/Throat: Mucous membranes are moist. Cardiovascular: Normal rate, regular rhythm. No murmur Respiratory: Normal respiratory effort without tachypnea nor retractions. Breath sounds are clear  Gastrointestinal: Soft and nontender. No distention.   Musculoskeletal: Nontender with normal range of motion in all extremities.  Neurologic:  Normal speech and language. No gross focal neurologic deficits Skin:  Skin is warm, dry and intact.  Psychiatric: Mood and affect are normal.   ____________________________________________   INITIAL IMPRESSION / ASSESSMENT AND PLAN / ED COURSE  Pertinent labs & imaging results that were available during my care of the patient were reviewed by me and considered in my medical decision making (see chart for details).  Patient presents to the emergency department for rectal pain and constipation.  States she feels like she needs to have a bowel movement but nothing is coming out.  Differential would include constipation, fecal  impaction, colitis or diverticulitis.  Overall patient appears well, does appear somewhat uncomfortable which she states is due to rectal pain.  Has a soft nontender abdomen.  We will perform a rectal examination and disimpaction if needed.  We will reassess after rectal exam.  On rectal examination patient had significant amount of hard stool, removed with digital disimpaction.  We will continue to closely monitor.  Patient wishes to attempt to have a bowel movement.  Examination very consistent with fecal impaction.  Patient is now had a large bowel movement after disimpaction.  Feeling much better.  We will discharge home with PCP follow-up.   ------------------------------------------------------------------------------------------------------------------- Fecal Disimpaction Procedure Note:  Performed by me:  Patient placed in the lateral recumbent position with  knees drawn towards chest. Nurse present for patient support. Large amount of hard brown stool removed. No complications during procedure.   ------------------------------------------------------------------------------------------------------------------    ____________________________________________   FINAL CLINICAL IMPRESSION(S) / ED DIAGNOSES  Constipation Rectal pain    Harvest Dark, MD 01/30/18 [phone]

## 2018-01-30 NOTE — ED Triage Notes (Signed)
Pt ambulatory to triage with no difficulty. Pt reports she tried to have a BM tonight and is unable to get the stool to come out. Pt reports she tried to get the stool out with a gloved hand with no relief. Pt reports pressure on her rectum and is unable to sit down.

## 2018-01-30 NOTE — ED Notes (Signed)
Pt is moving bowels at present

## 2018-01-30 NOTE — Telephone Encounter (Signed)
I can place the order for sleep study, but need a little more information regarding diagnosis.  She has nocturia (which can be associated with sleep apnea).  Does she have any other symptoms, i.e., snoring, witnessed apneic episodes, daytime somnolence and fatigue, not waking up feeling rested?

## 2018-02-02 NOTE — Telephone Encounter (Signed)
Patient says she does snore loudly has increased tiredness during the day, sometimes she does not feel rested.  Patient stated you also had recommended a sleep study  A year ago.

## 2018-02-02 NOTE — Telephone Encounter (Signed)
Actually we are now referring our pts to pulmonary for sleep study.  If she is agreeable, I will place the order to pulmonary for evaluation for sleep study and possible sleep apnea

## 2018-02-02 NOTE — Telephone Encounter (Signed)
Patient wants to talk with dr. Annamaria Boots before making Pulmonology referral but ask for referral to nutritionist.

## 2018-02-03 ENCOUNTER — Ambulatory Visit: Payer: BLUE CROSS/BLUE SHIELD | Attending: Internal Medicine | Admitting: Physical Therapy

## 2018-02-03 DIAGNOSIS — M4125 Other idiopathic scoliosis, thoracolumbar region: Secondary | ICD-10-CM | POA: Insufficient documentation

## 2018-02-03 DIAGNOSIS — R293 Abnormal posture: Secondary | ICD-10-CM

## 2018-02-03 DIAGNOSIS — M791 Myalgia, unspecified site: Secondary | ICD-10-CM | POA: Diagnosis present

## 2018-02-03 DIAGNOSIS — M6281 Muscle weakness (generalized): Secondary | ICD-10-CM | POA: Diagnosis present

## 2018-02-03 DIAGNOSIS — M533 Sacrococcygeal disorders, not elsewhere classified: Secondary | ICD-10-CM | POA: Diagnosis present

## 2018-02-03 NOTE — Telephone Encounter (Signed)
Please confirm why she wants dietician referral.  (is it for weight los, increased cholesterol, etc).

## 2018-02-03 NOTE — Telephone Encounter (Signed)
Pt stated that she needed info about lowering cholesterol and dieting information to get on the right track. She states she has lost 40lbs but also wants to get on track the correct way. Wants to know what she can do to control cholesterol without medication. She also wants to know if insurance will cover if not she will not be able to afford to do it.

## 2018-02-03 NOTE — Telephone Encounter (Signed)
Left message to return call to office, [place] nurse may advise.

## 2018-02-03 NOTE — Patient Instructions (Addendum)
    _________   Instead of drinking tea at night, drink it in the morning or lunch. This can potentially help you not have leakage issues at night  Stop drinking the tomatoe juice because it is a bladder irritant. Consult your cardiologist about the fluid retention you have in your ankles instead of drinking tomatoe juice.    Try to build back up your exercises gradually.  Use your " Portable Arm and Leg Exercise Pedal Exerciser" with arm support onto backs of chairs to sit up right. ( 15 min) x 2 .

## 2018-02-04 ENCOUNTER — Other Ambulatory Visit: Payer: Self-pay | Admitting: Internal Medicine

## 2018-02-04 DIAGNOSIS — E7800 Pure hypercholesterolemia, unspecified: Secondary | ICD-10-CM

## 2018-02-04 NOTE — Telephone Encounter (Signed)
I have placed the order for [facility] referral. Someone should be contacting her with appt date and time.  I am not sure if her insurance will cover.  I put a note in for [place] regarding this and Ms Sonnenfeld may have to contact her insurance.

## 2018-02-04 NOTE — Therapy (Signed)
[place] [facility] SERVICES [address] [place], [place], [address] Phone: [phone]   Fax:  [phone]  Physical Therapy Treatment  Patient Details  Name: Lynn Mann MRN: [id number] Date of Birth: [birth year] Referring Provider: Einar Pheasant, MD    Encounter Date: 02/03/2018  PT End of Session - 02/03/18 1447    Visit Number  7    Number of Visits  12    Date for PT Re-Evaluation  02/10/18    PT Start Time  6789    PT Stop Time  1454    PT Time Calculation (min)  41 min    Activity Tolerance  Patient tolerated treatment well    Behavior During Therapy  [facility] for tasks assessed/performed       Past Medical History:  Diagnosis Date  . Arthritis   . Chicken pox   . Cholecystitis 11/2011   Did not require sgy - Dr. Staci Acosta - [facility]  (cholelithiasis)  . H/O Clostridium difficile infection   . IBS (irritable bowel syndrome)   . MRSA exposure 2005   Spider bite  . MVP (mitral valve prolapse)    Stable - Dr. Ubaldo Glassing  . Rheumatic fever     Past Surgical History:  Procedure Laterality Date  . CATARACT EXTRACTION  [id number]  . MUSCLE BIOPSY      There were no vitals filed for this visit.  Subjective Assessment - 02/03/18 1425    Subjective  Pt had a UTI with e.coli which was the reason she skipped PT for the past 2 weeks. Pt was getting better with leakage issues during day and night prior to starting her antibiotics. Pt got to sleep through the night for several nights without getting to urinate.  This past week, pt had to go to the ER due to compacted stools and had her bowels removed . Pt had hemorrhoids. The last two days her uncontrollable leakage at night has been really bad. But prior to two days ago, the nighttime leakage was better. Pt has been drinking tomato juice as a direutic because she noticed she was retaining fluids with swollen ankles. Pt drinks tea ( 2-3 glasses) with dinner. Pt drinks 4 bottles of water.  Pt had  decreased her walking and PT exercises laely due to these issues. Pt continues to report low energy.     Pertinent History  Pt had a colonscopy which showed internal hemorrhoids, polyp that were benign and removed. Pt had lost 40 lb between May 2018 to current. Pt has felt she lost muscle mass through her weight loss which she did not following any diets. Pt is now trying has enough protein for build muscles but she has not worked with a nutritionist.            [facility] PT Assessment - 02/04/18 1240      Observation/Other Assessments   Observations  slightly slumping, required cues       Strength   Overall Strength Comments  hip abd 3+/5, ext 3+/5 B , thoracolumbar /glut ( minimal perturbation)                    [facility] Adult PT Treatment/Exercise - 02/04/18 1241      Neuro Re-ed    Neuro Re-ed Details   see pt instructions      Exercises   Exercises  -- see pt instructions             PT  Education - 02/03/18 1447    Education provided  Yes    Education Details  HEP    Person(s) Educated  Patient    Methods  Explanation;Demonstration;Tactile cues;Handout;Verbal cues    Comprehension  Returned demonstration;Verbalized understanding          PT Long Term Goals - 12/30/17 1442      PT LONG TERM GOAL #1   Title  Pt will decrease her PFDI score from 47% to < 42% in order to improve pelvic floor function ( 6/4/: 36%)     Time  8    Period  Weeks    Status  Achieved      PT LONG TERM GOAL #2   Title  Pt will report compliance with proper toileting technique and not squatting/ hovering over the toilet in public restrooms in order to promote pelvic health    Time  2    Period  Weeks    Status  Partially Met      PT LONG TERM GOAL #3   Title  Pt will demo IND with pelvic floor exercises     Time  12    Period  Weeks    Status  On-going      PT LONG TERM GOAL #4   Title  Pt will be referred to community fitness classes a senior center for overall  strengthening     Time  4    Period  Weeks    Status  Achieved      PT LONG TERM GOAL #5   Title  Pt will improve her bowel movements from every 3 days to daily and Stool Type  Bristol Scale from  Type 4 ( 50%) and Type 7 ( 50%) to Type 4 ( 75% of the time) in order to improve GI/ pelvic function   ( 6/4: every other day, Type 4 ( 75%)     Time  8    Period  Weeks    Status  Partially Met      Additional Long Term Goals   Additional Long Term Goals  Yes      PT LONG TERM GOAL #6   Title  Pt will demo less pronation in B with gait and mini squats in order to miminize risk for knee and pelvic floor dysfunctions    Time  8    Period  Weeks    Status  New    [place] Date  02/24/18            Plan - 02/03/18 1456    Clinical Impression Statement  Pt returned to PT after skipping appts for one month due to UTI, food poisioning, and compacted stools. Pt stopped performing her PT exercises. Pt showed no relapse of strength in hips and BLE. Pt reported prior to her recent UTI and GI episodes, pt reported she had less urinary leakage during the day and night.  Pt was educated about bladder irritants as pt reported she drank 2-3 glasses of tea with dinner which may have explained her nighttime leakage. Pt voiced understanding to change her bladder irritant intake. Pt stated she was drinking tomato juice for fluid retention she noticed in her legs. Pt was referred back to her cardiologist to discuss her fluid retention problems issues and to get screened for OSA given her cardiac risk factors, snoring, and persisting low energy level. DPT phoned pt's cardiologist 's office and left a message. Pt was guided to gradually return to  her exercises use of portable stationary pedal exercise machine to minimize deconditioning. Advised pt to gradually return to her HEP and to wait to return to resistance strengthening. Pt continues to benefit from skilled PT.      PT Frequency  1x / week    PT Duration  12  weeks    PT Treatment/Interventions  Neuromuscular re-education;Balance training;Therapeutic exercise;Orthotic Fit/Training;Functional mobility training;Moist Heat;Traction;Therapeutic activities;Patient/family education;Manual techniques    Consulted and Agree with Plan of Care  Patient       Patient will benefit from skilled therapeutic intervention in order to improve the following deficits and impairments:  Postural dysfunction, Increased muscle spasms, Decreased scar mobility, Decreased coordination, Decreased mobility, Decreased range of motion, Decreased endurance, Decreased activity tolerance, Difficulty walking, Decreased safety awareness, Decreased balance, Abnormal gait, Decreased strength, Hypomobility, Improper body mechanics  Visit Diagnosis: Myalgia  Muscle weakness (generalized)  Abnormal posture  Other idiopathic scoliosis, thoracolumbar region  Sacrococcygeal disorders, not elsewhere classified     Problem List Patient Active Problem List   Diagnosis Date Noted  . Itching 07/21/2017  . Asthma 07/16/2016  . Urinary incontinence 07/16/2016  . SOB (shortness of breath) 04/01/2016  . Bilateral shoulder pain 02/24/2016  . Neck fullness 01/28/2016  . Routine general medical examination at a health care facility 07/25/2015  . Health care maintenance 10/09/2014  . Neck pain 11/26/2013  . Hypercholesterolemia 11/26/2013  . History of colonic polyps 05/11/2013  . Hyperbilirubinemia 01/18/2013  . GERD (gastroesophageal reflux disease) 12/03/2012  . Cholelithiasis 11/07/2012  . IBS (irritable bowel syndrome) 11/07/2012  . History of rheumatic fever 08/28/2012  . MVP (mitral valve prolapse) 08/28/2012    Jerl Mina ,PT, DPT, E-RYT  02/04/2018, 12:51 PM  [place] [facility] SERVICES [address] [place], [place], [address] Phone: [phone]   Fax:  [phone]  Name: DAYNE DEKAY MRN: [id number] Date of  Birth: 1950-09-20

## 2018-02-04 NOTE — Progress Notes (Signed)
Order placed for referral to [facility].

## 2018-02-10 ENCOUNTER — Ambulatory Visit: Payer: BLUE CROSS/BLUE SHIELD | Admitting: Physical Therapy

## 2018-02-10 DIAGNOSIS — M533 Sacrococcygeal disorders, not elsewhere classified: Secondary | ICD-10-CM

## 2018-02-10 DIAGNOSIS — M791 Myalgia, unspecified site: Secondary | ICD-10-CM

## 2018-02-10 DIAGNOSIS — R293 Abnormal posture: Secondary | ICD-10-CM

## 2018-02-10 DIAGNOSIS — M4125 Other idiopathic scoliosis, thoracolumbar region: Secondary | ICD-10-CM

## 2018-02-10 DIAGNOSIS — M6281 Muscle weakness (generalized): Secondary | ICD-10-CM

## 2018-02-10 NOTE — Therapy (Signed)
[place] MAIN [facility] SERVICES [place], [place], [address] Phone: [phone]   Fax:  [phone]  Physical Therapy Treatment / Progress Note   Patient Details  Name: Lynn Mann MRN: [id number] Date of Birth: [birth year] Referring Provider: Einar Pheasant, MD    Encounter Date: 02/10/2018  PT End of Session - 02/10/18 1459    Visit Number  8    Number of Visits  12    Date for PT Re-Evaluation  02/10/18    PT Start Time  9458    PT Stop Time  1500    PT Time Calculation (min)  55 min    Activity Tolerance  Patient tolerated treatment well    Behavior During Therapy  [facility] for tasks assessed/performed       Past Medical History:  Diagnosis Date  . Arthritis   . Chicken pox   . Cholecystitis 11/2011   Did not require sgy - Dr. Staci Acosta - [facility]  (cholelithiasis)  . H/O Clostridium difficile infection   . IBS (irritable bowel syndrome)   . MRSA exposure 2005   Spider bite  . MVP (mitral valve prolapse)    Stable - Dr. Ubaldo Glassing  . Rheumatic fever     Past Surgical History:  Procedure Laterality Date  . CATARACT EXTRACTION  [id number]  . MUSCLE BIOPSY      There were no vitals filed for this visit.  Subjective Assessment - 02/10/18 1413    Subjective  Pt would like to learn which exercises to do and not do. Pt started taking her Colace and coffee which has helped with bowel movements. Hemorrhoids are getting better. Pt enjoy biking and has no pain.      Pertinent History  Pt had a colonscopy which showed internal hemorrhoids, polyp that were benign and removed. Pt had lost 40 lb between May 2018 to current. Pt has felt she lost muscle mass through her weight loss which she did not following any diets. Pt is now trying has enough protein for build muscles but she has not worked with a nutritionist.            [facility] PT Assessment - 02/10/18 1452      Squat   Comments  less pronation and genu valgus       Palpation    Spinal mobility  hypomobility/ tenderness/ T6, increased L interspinals/ paraspinals mm along thoracic/ midtrap/rhombhoids, posterior intercostals, levator scapulae L     Palpation comment  L shoulder downward rotation delayed, ( post Tx: increased rotaion of scapular but still delayed. Following R scapular stabilization exercise on R, pt showed symetrical down ward rotation )                      [facility] Adult PT Treatment/Exercise - 02/10/18 1453      Neuro Re-ed    Neuro Re-ed Details   see pt instructions      Modalities   Modalities  Moist Heat      Moist Heat Therapy   Number Minutes Moist Heat  5 Minutes      Manual Therapy   Manual therapy comments  PA mob Grade III at T6, STM at T6, increased L interspinals/ paraspinals mm along thoracic/ midtrap/rhombhoids, posterior intercostals, levator scapulae L                    PT Long Term Goals - 02/10/18 1415  PT LONG TERM GOAL #1   Title  Pt will decrease her PFDI score from 47% to < 42% in order to improve pelvic floor function ( 6/4/: 36%)     Time  8    Period  Weeks    Status  Achieved      PT LONG TERM GOAL #2   Title  Pt will report compliance with proper toileting technique and not squatting/ hovering over the toilet in public restrooms in order to promote pelvic health    Time  2    Period  Weeks    Status  Partially Met      PT LONG TERM GOAL #3   Title  Pt will demo IND with pelvic floor exercises     Time  12    Period  Weeks    Status  On-going      PT LONG TERM GOAL #4   Title  Pt will be referred to community fitness classes a senior center for overall strengthening     Time  4    Period  Weeks    Status  Achieved      PT LONG TERM GOAL #5   Title  Pt will improve her bowel movements from every 3 days to daily and Stool Type  Bristol Scale from  Type 4 ( 50%) and Type 7 ( 50%) to Type 4 ( 75% of the time) in order to improve GI/ pelvic function   ( 6/4: every other day, Type 4  ( 75%) , 7/16: with colace)      Time  8    Period  Weeks    Status  Achieved      Additional Long Term Goals   Additional Long Term Goals  Yes      PT LONG TERM GOAL #6   Title  Pt will demo less pronation in B with gait and mini squats in order to miminize risk for knee and pelvic floor dysfunctions    Time  8    Period  Weeks    Status  Achieved      PT LONG TERM GOAL #7   Title  Pt will be compliant with scoliosis HEP to minimize L midback ( shoulder blade area)  while sitting     Time  12    Period  Weeks    Status  New    Target Date  05/05/18            Plan - 02/10/18 1501    Clinical Impression Statement  Pt has achieved 4/7 goals since [facility]. Pt has improved on her PFDI score from 47%  to 36% with less leakage during the day. Pt's remaining urinary deficit is leaking during the night and nocturia.   Pt has stopped drinking teas in the evening and is undergoing a sleep study to test for OSA which can have association with nocturia.  ( Pt's cardiologist ordered her sleep study). Pt also has improved with her toileting technique and no longer squat / hover the toilets.  Bowel consistency has improved and able to control fecal leakage and make it to the bathroom in time. These improvements are due to the  Manual Tx she received to decrease perineal scar restrictions and the pelvic floor coordination training.  Pt also has shown improved overall strength. Pt continues to require skilled PT to address scoliosis related pain in L midback.  Today following Tx, pt showed improved scapular mobility and less  midback mm tension and decreased pain to < 1/10 from 3-4/10. Pt also requires skilled PT to address remaining goals and return to fitness for wellness.      PT Frequency  1x / week    PT Duration  12 weeks    PT Treatment/Interventions  Neuromuscular re-education;Balance training;Therapeutic exercise;Orthotic Fit/Training;Functional mobility training;Moist Heat;Traction;Therapeutic  activities;Patient/family education;Manual techniques    Consulted and Agree with Plan of Care  Patient       Patient will benefit from skilled therapeutic intervention in order to improve the following deficits and impairments:  Postural dysfunction, Increased muscle spasms, Decreased scar mobility, Decreased coordination, Decreased mobility, Decreased range of motion, Decreased endurance, Decreased activity tolerance, Difficulty walking, Decreased safety awareness, Decreased balance, Abnormal gait, Decreased strength, Hypomobility, Improper body mechanics  Visit Diagnosis: Myalgia  Muscle weakness (generalized)  Abnormal posture  Other idiopathic scoliosis, thoracolumbar region  Sacrococcygeal disorders, not elsewhere classified     Problem List Patient Active Problem List   Diagnosis Date Noted  . Itching 07/21/2017  . Asthma 07/16/2016  . Urinary incontinence 07/16/2016  . SOB (shortness of breath) 04/01/2016  . Bilateral shoulder pain 02/24/2016  . Neck fullness 01/28/2016  . Routine general medical examination at a health care facility 07/25/2015  . Health care maintenance 10/09/2014  . Neck pain 11/26/2013  . Hypercholesterolemia 11/26/2013  . History of colonic polyps 05/11/2013  . Hyperbilirubinemia 01/18/2013  . GERD (gastroesophageal reflux disease) 12/03/2012  . Cholelithiasis 11/07/2012  . IBS (irritable bowel syndrome) 11/07/2012  . History of rheumatic fever 08/28/2012  . MVP (mitral valve prolapse) 08/28/2012    Jerl Mina ,PT, DPT, E-RYT  02/10/2018, 3:09 PM  [place] MAIN [facility] SERVICES [address] [place], [place], [address] Phone: [phone]   Fax:  [phone]  Name: Lynn Mann MRN: [id number] Date of Birth: [birth year]

## 2018-02-10 NOTE — Patient Instructions (Signed)
Angel wing ( lower with elbow first , dragging along bed on L  10 x   _______  R sided push up against wall, hand is midarm level, elbow back towards posterior ribs  10 x 2

## 2018-02-17 ENCOUNTER — Ambulatory Visit: Payer: BLUE CROSS/BLUE SHIELD | Admitting: Physical Therapy

## 2018-02-23 ENCOUNTER — Encounter: Payer: BLUE CROSS/BLUE SHIELD | Admitting: Physical Therapy

## 2018-02-23 ENCOUNTER — Ambulatory Visit: Payer: BLUE CROSS/BLUE SHIELD | Admitting: Physical Therapy

## 2018-02-23 DIAGNOSIS — M533 Sacrococcygeal disorders, not elsewhere classified: Secondary | ICD-10-CM

## 2018-02-23 DIAGNOSIS — R293 Abnormal posture: Secondary | ICD-10-CM

## 2018-02-23 DIAGNOSIS — M791 Myalgia, unspecified site: Secondary | ICD-10-CM | POA: Diagnosis not present

## 2018-02-23 DIAGNOSIS — M4125 Other idiopathic scoliosis, thoracolumbar region: Secondary | ICD-10-CM

## 2018-02-23 DIAGNOSIS — M6281 Muscle weakness (generalized): Secondary | ICD-10-CM

## 2018-02-23 NOTE — Therapy (Signed)
[place] MAIN [facility] SERVICES [address] [place], [place], [address] Phone: [phone]   Fax:  [phone]  Physical Therapy Treatment  Patient Details  Name: Lynn Mann MRN: [id number] Date of Birth: 1951-05-09 Referring Provider: Einar Pheasant, MD    Encounter Date: 02/23/2018  PT End of Session - 02/23/18 1343    Visit Number  9    Number of Visits  12    Date for PT Re-Evaluation  02/10/18    PT Start Time  4128    PT Stop Time  1350    PT Time Calculation (min)  47 min    Activity Tolerance  Patient tolerated treatment well    Behavior During Therapy  [facility] for tasks assessed/performed       Past Medical History:  Diagnosis Date  . Arthritis   . Chicken pox   . Cholecystitis 11/2011   Did not require sgy - Dr. Staci Acosta - [facility]  (cholelithiasis)  . H/O Clostridium difficile infection   . IBS (irritable bowel syndrome)   . MRSA exposure 2005   Spider bite  . MVP (mitral valve prolapse)    Stable - Dr. Ubaldo Glassing  . Rheumatic fever     Past Surgical History:  Procedure Laterality Date  . CATARACT EXTRACTION  [id number]  . MUSCLE BIOPSY      There were no vitals filed for this visit.  Subjective Assessment - 02/23/18 1308    Subjective  Pt feels some pressure in the abdomen after having a little bowel movements. Pt is no longer gettign loose stools anymore. The stools  are not coming out and she feels they are stalling at a certain point, and at the end, they are a little slow. Her colonscopy turned out fine.       Pertinent History  Pt had a colonscopy which showed internal hemorrhoids, polyp that were benign and removed. Pt had lost 40 lb between May 2018 to current. Pt has felt she lost muscle mass through her weight loss which she did not following any diets. Pt is now trying has enough protein for build muscles but she has not worked with a nutritionist.            [facility] PT Assessment - 02/23/18 1400      Palpation    Spinal mobility  non-soft over medial aspect of L iliac crest                 Pelvic Floor Special Questions - 02/23/18 1353    Skin Integrity  -- rash without open wounds around anus     Pelvic Floor Internal Exam  pt consented verbally without contraindications    Exam Type  Rectal    Palpation   tightness above EAS and 5 o'clock ( decreased post Tx)     Strength  weak squeeze, no lift dyscoordination                      PT Long Term Goals - 02/10/18 1415      PT LONG TERM GOAL #1   Title  Pt will decrease her PFDI score from 47% to < 42% in order to improve pelvic floor function ( 6/4/: 36%)     Time  8    Period  Weeks    Status  Achieved      PT LONG TERM GOAL #2   Title  Pt will report compliance with proper toileting  technique and not squatting/ hovering over the toilet in public restrooms in order to promote pelvic health    Time  2    Period  Weeks    Status  Partially Met      PT LONG TERM GOAL #3   Title  Pt will demo IND with pelvic floor exercises     Time  12    Period  Weeks    Status  On-going      PT LONG TERM GOAL #4   Title  Pt will be referred to community fitness classes a senior center for overall strengthening     Time  4    Period  Weeks    Status  Achieved      PT LONG TERM GOAL #5   Title  Pt will improve her bowel movements from every 3 days to daily and Stool Type  Bristol Scale from  Type 4 ( 50%) and Type 7 ( 50%) to Type 4 ( 75% of the time) in order to improve GI/ pelvic function   ( 6/4: every other day, Type 4 ( 75%) , 7/16: with colace)      Time  8    Period  Weeks    Status  Achieved      Additional Long Term Goals   Additional Long Term Goals  Yes      PT LONG TERM GOAL #6   Title  Pt will demo less pronation in B with gait and mini squats in order to miminize risk for knee and pelvic floor dysfunctions    Time  8    Period  Weeks    Status  Achieved      PT LONG TERM GOAL #7   Title  Pt will be  compliant with scoliosis HEP to minimize L midback ( shoulder blade area)  while sitting     Time  12    Period  Weeks    Status  New    Target Date  05/05/18            Plan - 02/23/18 1530    Clinical Impression Statement  Pt demo'd increased rectal mm tightness and tenderness which decreased post manual Tx. Pt also showed dyscoordination of posterior pelvic floor which corrected following training. Pt showed increased fascial tightness over L medial aspect of iliac crest which decreased post Tx. Pt reported she felt less pressure in her low abdominal area following Tx. Pt continues to benefit from skilled PT    PT Frequency  1x / week    PT Duration  12 weeks    PT Treatment/Interventions  Neuromuscular re-education;Balance training;Therapeutic exercise;Orthotic Fit/Training;Functional mobility training;Moist Heat;Traction;Therapeutic activities;Patient/family education;Manual techniques    Consulted and Agree with Plan of Care  Patient       Patient will benefit from skilled therapeutic intervention in order to improve the following deficits and impairments:  Postural dysfunction, Increased muscle spasms, Decreased scar mobility, Decreased coordination, Decreased mobility, Decreased range of motion, Decreased endurance, Decreased activity tolerance, Difficulty walking, Decreased safety awareness, Decreased balance, Abnormal gait, Decreased strength, Hypomobility, Improper body mechanics  Visit Diagnosis: Myalgia  Muscle weakness (generalized)  Abnormal posture  Other idiopathic scoliosis, thoracolumbar region  Sacrococcygeal disorders, not elsewhere classified     Problem List Patient Active Problem List   Diagnosis Date Noted  . Itching 07/21/2017  . Asthma 07/16/2016  . Urinary incontinence 07/16/2016  . SOB (shortness of breath) 04/01/2016  . Bilateral shoulder pain 02/24/2016  .  Neck fullness 01/28/2016  . Routine general medical examination at a health care  facility 07/25/2015  . Health care maintenance 10/09/2014  . Neck pain 11/26/2013  . Hypercholesterolemia 11/26/2013  . History of colonic polyps 05/11/2013  . Hyperbilirubinemia 01/18/2013  . GERD (gastroesophageal reflux disease) 12/03/2012  . Cholelithiasis 11/07/2012  . IBS (irritable bowel syndrome) 11/07/2012  . History of rheumatic fever 08/28/2012  . MVP (mitral valve prolapse) 08/28/2012    Jerl Mina ,PT, DPT, E-RYT  02/23/2018, 3:42 PM  [place] MAIN [facility] SERVICES [address] [place], [place], [address] Phone: [phone]   Fax:  [phone]  Name: Lynn Mann MRN: [id number] Date of Birth: 02-Jan-1951

## 2018-03-02 ENCOUNTER — Ambulatory Visit: Payer: BLUE CROSS/BLUE SHIELD | Attending: Internal Medicine | Admitting: Physical Therapy

## 2018-03-02 DIAGNOSIS — R293 Abnormal posture: Secondary | ICD-10-CM | POA: Diagnosis present

## 2018-03-02 DIAGNOSIS — M533 Sacrococcygeal disorders, not elsewhere classified: Secondary | ICD-10-CM | POA: Diagnosis present

## 2018-03-02 DIAGNOSIS — M791 Myalgia, unspecified site: Secondary | ICD-10-CM | POA: Diagnosis present

## 2018-03-02 DIAGNOSIS — M4125 Other idiopathic scoliosis, thoracolumbar region: Secondary | ICD-10-CM | POA: Insufficient documentation

## 2018-03-02 DIAGNOSIS — M25551 Pain in right hip: Secondary | ICD-10-CM | POA: Diagnosis present

## 2018-03-02 DIAGNOSIS — M6281 Muscle weakness (generalized): Secondary | ICD-10-CM

## 2018-03-02 NOTE — Therapy (Signed)
[place] MAIN [facility] SERVICES [address] [place], [place], [address] Phone: [phone]   Fax:  [phone]  Physical Therapy Treatment  Patient Details  Name: Lynn Mann MRN: [id number] Date of Birth: 05/03/1951 Referring Provider: Einar Pheasant, MD    Encounter Date: 03/02/2018  PT End of Session - 03/02/18 1316    Visit Number  11    Number of Visits  12    Date for PT Re-Evaluation  05/05/18    PT Start Time  1310    PT Stop Time  1412    PT Time Calculation (min)  62 min    Activity Tolerance  Patient tolerated treatment well    Behavior During Therapy  [facility] for tasks assessed/performed       Past Medical History:  Diagnosis Date  . Arthritis   . Chicken pox   . Cholecystitis 11/2011   Did not require sgy - Dr. Staci Acosta - [facility]  (cholelithiasis)  . H/O Clostridium difficile infection   . IBS (irritable bowel syndrome)   . MRSA exposure 2005   Spider bite  . MVP (mitral valve prolapse)    Stable - Dr. Ubaldo Glassing  . Rheumatic fever     Past Surgical History:  Procedure Laterality Date  . CATARACT EXTRACTION  [id number]  . MUSCLE BIOPSY      There were no vitals filed for this visit.  Subjective Assessment - 03/02/18 1311    Subjective  Pt did not make it to the bathroom in time before leaking with lots of volume. This happened across 2 nights last week. Pt had completed her sleep study which showed she stopped breathing 28 x one day and 11 x the next night. Pt has made an appt next appt with the cardiologist to speak about her results.  Pt has had better formed stools without Colace across 2 days       Pertinent History  Pt had a colonscopy which showed internal hemorrhoids, polyp that were benign and removed. Pt had lost 40 lb between May 2018 to current. Pt has felt she lost muscle mass through her weight loss which she did not following any diets. Pt is now trying has enough protein for build muscles but she has not worked  with a nutritionist.                       Pelvic Floor Special Questions - 03/02/18 1407    Skin Integrity  -- rash without open wounds around anus     Pelvic Floor Internal Exam  pt consented verbally without contraindications    Exam Type  Rectal    Palpation   tightness above EAS and 3-5 o'clock ( decreased post Tx)     Strength  fair squeeze, definite lift improved coordination, cue for more lengthening         [facility] Adult PT Treatment/Exercise - 03/02/18 1408      Neuro Re-ed    Neuro Re-ed Details   see pt instructions      Manual Therapy   Internal Pelvic Floor  fascial mobilization at rectal canal at problem areas             PT Education - 03/02/18 1407    Education provided  Yes    Education Details  HEP    Person(s) Educated  Patient    Methods  Explanation;Demonstration;Tactile cues;Verbal cues;Handout    Comprehension  Returned demonstration;Verbalized understanding  PT Long Term Goals - 03/02/18 1420      PT LONG TERM GOAL #1   Title  Pt will decrease her PFDI score from 47% to < 42% in order to improve pelvic floor function ( 6/4/: 36%)     Time  8    Period  Weeks    Status  Achieved      PT LONG TERM GOAL #2   Title  Pt will report compliance with proper toileting technique and not squatting/ hovering over the toilet in public restrooms in order to promote pelvic health    Time  2    Period  Weeks    Status  Achieved      PT LONG TERM GOAL #3   Title  Pt will demo IND with pelvic floor exercises     Time  12    Period  Weeks    Status  On-going      PT LONG TERM GOAL #4   Title  Pt will be referred to community fitness classes a senior center for overall strengthening     Time  4    Period  Weeks    Status  Achieved      PT LONG TERM GOAL #5   Title  Pt will improve her bowel movements from every 3 days to daily and Stool Type  Bristol Scale from  Type 4 ( 50%) and Type 7 ( 50%) to Type 4 ( 75% of the time) in  order to improve GI/ pelvic function   ( 6/4: every other day, Type 4 ( 75%) , 7/16: with colace)      Time  8    Period  Weeks    Status  Achieved      Additional Long Term Goals   Additional Long Term Goals  Yes      PT LONG TERM GOAL #6   Title  Pt will demo less pronation in B with gait and mini squats in order to miminize risk for knee and pelvic floor dysfunctions    Time  8    Period  Weeks    Status  Achieved      PT LONG TERM GOAL #7   Title  Pt will be compliant with scoliosis HEP to minimize L midback ( shoulder blade area)  while sitting     Time  12    Period  Weeks    Status  On-going            Plan - 03/02/18 1317    Clinical Impression Statement   Pt has demo'd significantly decreased perineal scars with improved pelvic coordination/ less straining and proper lengthening of mm. This improvement is associated with pt's report of better formed stools without Colace across 2 days. Progressed scoliosis-specific HEP to improve scapular depression and scapular/cervical retraction and depression. Pt demo'd correct technique with new exercise following cues.  Suspect pt's remaining urge incontinence in the middle of the night with large volume of urine may be related to OSA.  Marland KitchenPt had completed her sleep study which she report to show that she stopped breathing 28 x one day and 11 x the next night. Pt has made an appt next appt with the cardiologist to speak about her results.     Pt plans to f/u with her insurance about coverage for nutritional consult. Pt remains motivated with HEP. Simplifed her deep core and hip strengthening exercises to promote compliance.   Pt continues to  benefit from skilled PT.     PT Frequency  1x / week    PT Duration  12 weeks    PT Treatment/Interventions  Neuromuscular re-education;Balance training;Therapeutic exercise;Orthotic Fit/Training;Functional mobility training;Moist Heat;Traction;Therapeutic activities;Patient/family  education;Manual techniques    Consulted and Agree with Plan of Care  Patient       Patient will benefit from skilled therapeutic intervention in order to improve the following deficits and impairments:  Postural dysfunction, Increased muscle spasms, Decreased scar mobility, Decreased coordination, Decreased mobility, Decreased range of motion, Decreased endurance, Decreased activity tolerance, Difficulty walking, Decreased safety awareness, Decreased balance, Abnormal gait, Decreased strength, Hypomobility, Improper body mechanics  Visit Diagnosis: Myalgia  Muscle weakness (generalized)  Abnormal posture  Other idiopathic scoliosis, thoracolumbar region  Sacrococcygeal disorders, not elsewhere classified  Pain in right hip     Problem List Patient Active Problem List   Diagnosis Date Noted  . Itching 07/21/2017  . Asthma 07/16/2016  . Urinary incontinence 07/16/2016  . SOB (shortness of breath) 04/01/2016  . Bilateral shoulder pain 02/24/2016  . Neck fullness 01/28/2016  . Routine general medical examination at a health care facility 07/25/2015  . Health care maintenance 10/09/2014  . Neck pain 11/26/2013  . Hypercholesterolemia 11/26/2013  . History of colonic polyps 05/11/2013  . Hyperbilirubinemia 01/18/2013  . GERD (gastroesophageal reflux disease) 12/03/2012  . Cholelithiasis 11/07/2012  . IBS (irritable bowel syndrome) 11/07/2012  . History of rheumatic fever 08/28/2012  . MVP (mitral valve prolapse) 08/28/2012    Jerl Mina ,PT, DPT, E-RYT  03/02/2018, 2:21 PM  [place] MAIN [facility] SERVICES [address] [place], [place], [address] Phone: [phone]   Fax:  [phone]  Name: Lynn Mann MRN: [id number] Date of Birth: 1951/01/05

## 2018-03-02 NOTE — Patient Instructions (Addendum)
Check off grid for this week. You can put the date and a check mark after completing the exercise.   Morning Ex name Mon Tue Wed Thurs             Fri   Sat Arnette Norris wing-             "A" to "w" red band - Hitchhiker fist - 30 reps                            Midday Ex name Mon Tue Wed Thurs             Fri   Sat Jeani Sow tucks 5 rep x 5             Mini squats 10 x3                              Evening Ex name Mon Tue Wed Thurs             Fri   Sat Sun     Deep core level 2 - 2min             Clam shells 20 reps each side                              "A" to "W" exercise  10 reps x 2 sets   Band is placed under feet, knees bent, feet are hip width apart Hold band with thumbs point out, keep upper arm and elbow touching the bed the whole time  - inhale and then exhale pull bands by bending elbows hands move in a "w"  (feel shoulder blades squeezing)    ___________

## 2018-03-09 ENCOUNTER — Encounter: Payer: BLUE CROSS/BLUE SHIELD | Admitting: Physical Therapy

## 2018-03-13 ENCOUNTER — Telehealth: Payer: Self-pay | Admitting: Internal Medicine

## 2018-03-13 NOTE — Telephone Encounter (Signed)
Copied from [place] [id number]. Topic: Quick Communication - See Telephone Encounter >> Mar 13, 2018  5:10 PM R[place]ford N[place], [place] wrote: CRM for notification. See Telephone encounter for: 03/13/18. Patient calling and states that she would like to know if Dr Nicki Reaper could check her iron, b12 and vit d. Please advise. CB#:[phone]

## 2018-03-16 ENCOUNTER — Ambulatory Visit: Payer: BLUE CROSS/BLUE SHIELD | Admitting: Physical Therapy

## 2018-03-16 DIAGNOSIS — R293 Abnormal posture: Secondary | ICD-10-CM

## 2018-03-16 DIAGNOSIS — M6281 Muscle weakness (generalized): Secondary | ICD-10-CM

## 2018-03-16 DIAGNOSIS — M791 Myalgia, unspecified site: Secondary | ICD-10-CM | POA: Diagnosis not present

## 2018-03-16 DIAGNOSIS — M25551 Pain in right hip: Secondary | ICD-10-CM

## 2018-03-16 DIAGNOSIS — M4125 Other idiopathic scoliosis, thoracolumbar region: Secondary | ICD-10-CM

## 2018-03-16 DIAGNOSIS — M533 Sacrococcygeal disorders, not elsewhere classified: Secondary | ICD-10-CM

## 2018-03-16 NOTE — Patient Instructions (Addendum)
As discussed with strategies you created:   Maintain the routine from last session ( reprinted handout)   ___   Try fitness routine at the [facility]:  one class x 1 x week ( exhale with lifting dumbbells 2-3lbs)   Try walking in [employer] or try to call a friend to go walking ( 3 x week)  ____  Next week:  One day of the week: pool walking in 3' feet. 5 min warm up / cool down w/ 20 min walking forward, backward, side stepping   Try walking in [employer] or try to call a friend to go walking ( 3 x week)

## 2018-03-16 NOTE — Telephone Encounter (Signed)
Request for lab

## 2018-03-16 NOTE — Therapy (Signed)
[place] MAIN [facility] SERVICES [address] [place], [place], [address] Phone: [phone]   Fax:  [phone]  Physical Therapy Treatment  Patient Details  Name: Lynn Mann MRN: [id number] Date of Birth: 05/28/1951 Referring Provider: Einar Pheasant, MD    Encounter Date: 03/16/2018  PT End of Session - 03/16/18 1518    Visit Number  12    Date for PT Re-Evaluation  05/05/18    PT Start Time  1504    PT Stop Time  1550    PT Time Calculation (min)  46 min    Activity Tolerance  Patient tolerated treatment well    Behavior During Therapy  [facility] for tasks assessed/performed       Past Medical History:  Diagnosis Date  . Arthritis   . Chicken pox   . Cholecystitis 11/2011   Did not require sgy - Dr. Staci Acosta - [facility]  (cholelithiasis)  . H/O Clostridium difficile infection   . IBS (irritable bowel syndrome)   . MRSA exposure 2005   Spider bite  . MVP (mitral valve prolapse)    Stable - Dr. Ubaldo Glassing  . Rheumatic fever     Past Surgical History:  Procedure Laterality Date  . CATARACT EXTRACTION  [id number]  . MUSCLE BIOPSY      There were no vitals filed for this visit.  Subjective Assessment - 03/16/18 1516    Subjective  Pt has not done her exercises the past week due to needing to organize her house. Pt has only had one night of leakage episode.  Pt has cut out her caffeine at night. Bowel movements are easier.  Pt has not been walking but once a week this past week because her husband is out of town. Pt does not sleep well when her husband is gone. Pt would like to get a walking buddy.      Pertinent History  Pt had a colonscopy which showed internal hemorrhoids, polyp that were benign and removed. Pt had lost 40 lb between May 2018 to current. Pt has felt she lost muscle mass through her weight loss which she did not following any diets. Pt is now trying has enough protein for build muscles but she has not worked with a nutritionist.             [facility] PT Assessment - 03/16/18 1611      Coordination   Gross Motor Movements are Fluid and Coordinated  --   proper coordination with deumbbell ( bicep curl/ overhead)                   [place] Adult PT Treatment/Exercise - 03/16/18 1607      Therapeutic Activites    Therapeutic Activities  --   discussed/ collaborated strategies for compliance            PT Education - 03/16/18 1545    Education provided  Yes    Education Details  HEP    Person(s) Educated  Patient    Methods  Explanation;Demonstration;Tactile cues;Verbal cues;Handout    Comprehension  Returned demonstration;Verbalized understanding          PT Long Term Goals - 03/16/18 1606      PT LONG TERM GOAL #1   Title  Pt will decrease her PFDI score from 47% to < 42% in order to improve pelvic floor function ( 6/4/: 36%)     Time  8    Period  Weeks  Status  Achieved      PT LONG TERM GOAL #2   Title  Pt will report compliance with proper toileting technique and not squatting/ hovering over the toilet in public restrooms in order to promote pelvic health    Time  2    Period  Weeks    Status  Achieved      PT LONG TERM GOAL #3   Title  Pt will demo IND with pelvic floor exercises     Time  12    Period  Weeks    Status  On-going      PT LONG TERM GOAL #4   Title  Pt will be referred to community fitness classes a senior center for overall strengthening     Time  4    Period  Weeks    Status  Achieved      PT LONG TERM GOAL #5   Title  Pt will improve her bowel movements from every 3 days to daily and Stool Type  Bristol Scale from  Type 4 ( 50%) and Type 7 ( 50%) to Type 4 ( 75% of the time) in order to improve GI/ pelvic function   ( 6/4: every other day, Type 4 ( 75%) , 7/16: with colace)      Time  8    Period  Weeks    Status  Achieved      PT LONG TERM GOAL #6   Title  Pt will demo less pronation in B with gait and mini squats in order to miminize risk for  knee and pelvic floor dysfunctions    Time  8    Period  Weeks    Status  Achieved      PT LONG TERM GOAL #7   Title  Pt will be compliant with scoliosis HEP to minimize L midback ( shoulder blade area)  while sitting     Time  12    Period  Weeks    Status  On-going            Plan - 03/16/18 1608    Clinical Impression Statement  Pt requried motivational interviewing, SMART goal setting strategies along with education about principle of exercise to not progress with too many fitness classess too quickly given her prior low endurance levels. Achieved a plan for pt to work on to rebuild compliance to HEP and to start integrating one fitness a week class with walking 3x  week. Pt 's questions were answered with education and practice of lifting 2-3 lb dumbbells with exhalation in preparation with fitness class. Pt demo'd correctly. Pt continues to benefit from skilled PT.     PT Frequency  1x / week    PT Duration  12 weeks    PT Treatment/Interventions  Neuromuscular re-education;Balance training;Therapeutic exercise;Orthotic Fit/Training;Functional mobility training;Moist Heat;Traction;Therapeutic activities;Patient/family education;Manual techniques    Consulted and Agree with Plan of Care  Patient       Patient will benefit from skilled therapeutic intervention in order to improve the following deficits and impairments:  Postural dysfunction, Increased muscle spasms, Decreased scar mobility, Decreased coordination, Decreased mobility, Decreased range of motion, Decreased endurance, Decreased activity tolerance, Difficulty walking, Decreased safety awareness, Decreased balance, Abnormal gait, Decreased strength, Hypomobility, Improper body mechanics  Visit Diagnosis: Myalgia  Muscle weakness (generalized)  Abnormal posture  Other idiopathic scoliosis, thoracolumbar region  Pain in right hip  Sacrococcygeal disorders, not elsewhere classified     Problem List Patient  Active Problem List   Diagnosis Date Noted  . Itching 07/21/2017  . Asthma 07/16/2016  . Urinary incontinence 07/16/2016  . SOB (shortness of breath) 04/01/2016  . Bilateral shoulder pain 02/24/2016  . Neck fullness 01/28/2016  . Routine general medical examination at a health care facility 07/25/2015  . Health care maintenance 10/09/2014  . Neck pain 11/26/2013  . Hypercholesterolemia 11/26/2013  . History of colonic polyps 05/11/2013  . Hyperbilirubinemia 01/18/2013  . GERD (gastroesophageal reflux disease) 12/03/2012  . Cholelithiasis 11/07/2012  . IBS (irritable bowel syndrome) 11/07/2012  . History of rheumatic fever 08/28/2012  . MVP (mitral valve prolapse) 08/28/2012    Jerl Mina ,PT, DPT, E-RYT  03/16/2018, 4:12 PM  [place] MAIN [facility] SERVICES [address] [place], [place], [address] Phone: [phone]   Fax:  [phone]  Name: MATTALYNN CRANDLE MRN: [id number] Date of Birth: Mar 30, 1951

## 2018-03-16 NOTE — Telephone Encounter (Signed)
Left message to call back. Need more info as to why she is wanting these labs. Is she having acute symptoms, etc?  OK for [facility] to advise

## 2018-03-18 ENCOUNTER — Other Ambulatory Visit (INDEPENDENT_AMBULATORY_CARE_PROVIDER_SITE_OTHER): Payer: BLUE CROSS/BLUE SHIELD

## 2018-03-18 ENCOUNTER — Other Ambulatory Visit: Payer: BLUE CROSS/BLUE SHIELD

## 2018-03-18 DIAGNOSIS — E7800 Pure hypercholesterolemia, unspecified: Secondary | ICD-10-CM | POA: Diagnosis not present

## 2018-03-18 LAB — BASIC METABOLIC PANEL
BUN: 17 mg/dL (ref 6–23)
CO2: 24 mEq/L (ref 19–32)
Calcium: 10 mg/dL (ref 8.4–10.5)
Chloride: 106 mEq/L (ref 96–112)
Creatinine, Ser: 0.71 mg/dL (ref 0.40–1.20)
GFR: 87.67 mL/min (ref >60.00)
Glucose, Bld: 91 mg/dL (ref 70–99)
Potassium: 4.2 mEq/L (ref 3.5–5.1)
Sodium: 141 mEq/L (ref 135–145)

## 2018-03-18 LAB — CBC WITH DIFFERENTIAL/PLATELET
Basophils Absolute: 0 10*3/uL (ref 0.0–0.1)
Basophils Relative: 1 % (ref 0.0–3.0)
Eosinophils Absolute: 0.1 10*3/uL (ref 0.0–0.7)
Eosinophils Relative: 1.3 % (ref 0.0–5.0)
HCT: 41.6 % (ref 36.0–46.0)
Hemoglobin: 13.8 g/dL (ref 12.0–15.0)
Lymphocytes Relative: 41.8 % (ref 12.0–46.0)
Lymphs Abs: 2 10*3/uL (ref 0.7–4.0)
MCHC: 33.2 g/dL (ref 30.0–36.0)
MCV: 89.5 fl (ref 78.0–100.0)
Monocytes Absolute: 0.4 10*3/uL (ref 0.1–1.0)
Monocytes Relative: 7.4 % (ref 3.0–12.0)
Neutro Abs: 2.3 10*3/uL (ref 1.4–7.7)
Neutrophils Relative %: 48.5 % (ref 43.0–77.0)
Platelets: 234 10*3/uL (ref 150.0–400.0)
RBC: 4.65 Mil/uL (ref 3.87–5.11)
RDW: 13.6 % (ref 11.5–15.5)
WBC: 4.7 10*3/uL (ref 4.0–10.5)

## 2018-03-18 LAB — TSH: TSH: 4.65 u[IU]/mL — ABNORMAL HIGH (ref 0.35–4.50)

## 2018-03-18 LAB — LIPID PANEL
Cholesterol: 197 mg/dL (ref 0–200)
HDL: 47.2 mg/dL (ref >39.00)
LDL Cholesterol: 129 mg/dL — ABNORMAL HIGH (ref 0–99)
NonHDL: 149.89
Total CHOL/HDL Ratio: 4
Triglycerides: 106 mg/dL (ref 0.0–149.0)
VLDL: 21.2 mg/dL (ref 0.0–40.0)

## 2018-03-18 LAB — HEPATIC FUNCTION PANEL
ALT: 13 U/L (ref 0–35)
AST: 17 U/L (ref 0–37)
Albumin: 4.3 g/dL (ref 3.5–5.2)
Alkaline Phosphatase: 45 U/L (ref 39–117)
Bilirubin, Direct: 0.2 mg/dL (ref 0.0–0.3)
Total Bilirubin: 1.3 mg/dL — ABNORMAL HIGH (ref 0.2–1.2)
Total Protein: 7.2 g/dL (ref 6.0–8.3)

## 2018-03-18 NOTE — Telephone Encounter (Signed)
Patient is wanting to have iron, b12, vitamin d levels checked to see what they are. She is considering taking supplements and would like to know what her levels are before she starts anything.

## 2018-03-19 ENCOUNTER — Other Ambulatory Visit: Payer: Self-pay

## 2018-03-19 NOTE — Telephone Encounter (Signed)
Please let her know that I am out of the office.  She had labs drawn yesterday.  Do not think I can add these to that blood.  She would have to come in again for lab draw.  Also, I am not sure that insurance would cover these labs).  I do not mind ordering, but she will need to sign a waiver.

## 2018-03-19 NOTE — Telephone Encounter (Signed)
Patient is aware that you are out of the office, I spoke with her in person yesterday when she came in for labs and explained that you would return on Monday. Called patient this morning to advise her that you had given approval for labs but we cannot add labs on due to insurance may not cover and there is a waiver for her to sign. Patient was very understanding and is going to call her insurance company for clarification on coverage. Patient is coming in on Monday for appt. Also, while I was on the phone with her she mentioned that she was seen in acute care before 7/5 and was dx with UTI and treated with abx. Patient is wondering if she should do a repeat urine when she comes in. Confirmed no acute sx but she said the only sx she had before was back ache. She has been having minor back ache intermittently but is not sure if the two are related. Advised that she had a CBC drawn yesterday and if there was infection it would like show and that I would send phone note to you to make you aware before her appt in case you would like to repeat a urine to confirm infection is cleared.

## 2018-03-20 NOTE — Telephone Encounter (Signed)
I am ok to check f/u urinalysis and culture to confirm no infection.

## 2018-03-20 NOTE — Telephone Encounter (Signed)
Noted  

## 2018-03-23 ENCOUNTER — Ambulatory Visit (INDEPENDENT_AMBULATORY_CARE_PROVIDER_SITE_OTHER): Payer: BLUE CROSS/BLUE SHIELD | Admitting: Internal Medicine

## 2018-03-23 ENCOUNTER — Encounter: Payer: Self-pay | Admitting: Internal Medicine

## 2018-03-23 VITALS — BP 128/72 | HR 87 | Temp 98.6°F | Resp 18 | Wt 145.4 lb

## 2018-03-23 DIAGNOSIS — E7800 Pure hypercholesterolemia, unspecified: Secondary | ICD-10-CM | POA: Diagnosis not present

## 2018-03-23 DIAGNOSIS — R7989 Other specified abnormal findings of blood chemistry: Secondary | ICD-10-CM

## 2018-03-23 DIAGNOSIS — J4520 Mild intermittent asthma, uncomplicated: Secondary | ICD-10-CM

## 2018-03-23 NOTE — Progress Notes (Signed)
Patient ID: Lynn Mann, female   DOB: 1950-09-27, 67 y.o.   MRN: [id number]   Subjective:    Patient ID: Lynn Mann, female    DOB: 08/11/1950, 67 y.o.   MRN: [id number]  HPI  Patient here for a scheduled follow up.  She reports she is doing better.  Feels better.  Saw cardiology.  S/p holter - no prolonged pauses or significant bradycardic episodes during waking hours.  No significant arrhythmia or structural abnormality.  No chest pain.  Breathing stable.  No acid reflux.  No abdominal pain.  Bowels moving.  Working with physical therapy for pelvic floor exercises.  Working well for her.  Discussed lab results.     Past Medical History:  Diagnosis Date  . Arthritis   . Chicken pox   . Cholecystitis 11/2011   Did not require sgy - Dr. Staci Acosta - [facility]  (cholelithiasis)  . H/O Clostridium difficile infection   . IBS (irritable bowel syndrome)   . MRSA exposure 2005   Spider bite  . MVP (mitral valve prolapse)    Stable - Dr. Ubaldo Glassing  . Rheumatic fever    Past Surgical History:  Procedure Laterality Date  . CATARACT EXTRACTION  [id number]  . MUSCLE BIOPSY     Family History  Problem Relation Age of Onset  . Arthritis Mother   . Stroke Mother   . Diabetes Mother   . Hypertension Mother   . Arthritis Father   . Stroke Father   . Diabetes Paternal Grandmother   . Cancer Paternal Uncle        colon  . Heart disease Unknown        maternal and paternal side  . Breast cancer Paternal Aunt   . Breast cancer Cousin   . Breast cancer Cousin        female cousin   Social History   Socioeconomic History  . Marital status: Married    Spouse name: Not on file  . Number of children: Not on file  . Years of education: 11  . Highest education level: Not on file  Occupational History  . Occupation: Retired  Scientific laboratory technician  . Financial resource strain: Not on file  . Food insecurity:    Worry: Not on file    Inability: Not on file  . Transportation needs:    Medical: Not on  file    Non-medical: Not on file  Tobacco Use  . Smoking status: Never Smoker  . Smokeless tobacco: Never Used  Substance and Sexual Activity  . Alcohol use: Yes    Alcohol/week: 0.0 standard drinks    Comment: Rarely  . Drug use: No  . Sexual activity: Yes    Partners: Male    Birth control/protection: Post-menopausal  Lifestyle  . Physical activity:    Days per week: Not on file    Minutes per session: Not on file  . Stress: Not on file  Relationships  . Social connections:    Talks on phone: Not on file    Gets together: Not on file    Attends religious service: Not on file    Active member of club or organization: Not on file    Attends meetings of clubs or organizations: Not on file    Relationship status: Not on file  Other Topics Concern  . Not on file  Social History Narrative  . Not on file    Outpatient Encounter Medications as of 03/23/2018  Medication Sig  .  albuterol (PROVENTIL HFA;VENTOLIN HFA) 108 (90 Base) MCG/ACT inhaler Inhale 2 puffs into the lungs every 4 (four) hours as needed for wheezing or shortness of breath.  . ANUCORT-HC 25 MG suppository UNW AND I 1 SUP REC BID  . aspirin 325 MG tablet Take by mouth.  . fluticasone (FLONASE) 50 MCG/ACT nasal spray Place 1 spray into both nostrils 2 (two) times daily.  [name] Glycol-Propyl Glycol (SYSTANE OP) Apply to eye.  . sodium chloride (OCEAN) 0.65 % SOLN nasal spray Place 2 sprays into both nostrils every 2 (two) hours while awake.   No facility-administered encounter medications on file as of 03/23/2018.     Review of Systems  Constitutional: Negative for appetite change and unexpected weight change.  HENT: Negative for congestion and sinus pressure.   Respiratory: Negative for cough, chest tightness and shortness of breath.   Cardiovascular: Negative for chest pain, palpitations and leg swelling.  Gastrointestinal: Negative for abdominal pain, diarrhea, nausea and vomiting.  Genitourinary:  Negative for difficulty urinating and dysuria.  Musculoskeletal: Negative for joint swelling and myalgias.  Skin: Negative for color change and rash.  Neurological: Negative for dizziness, light-headedness and headaches.  Psychiatric/Behavioral: Negative for agitation and confusion.       Objective:    Physical Exam  Constitutional: She appears well-developed.  HENT:  Nose: Nose normal.  Mouth/Throat: Oropharynx is clear and moist.  Neck: Neck supple. No thyromegaly present.  Cardiovascular: Normal rate and regular rhythm.  Pulmonary/Chest: Breath sounds normal. No respiratory distress. She has no wheezes.  Abdominal: Soft. Bowel sounds are normal. There is no tenderness.  Musculoskeletal: She exhibits no edema or tenderness.  Lymphadenopathy:    She has no cervical adenopathy.  Skin: No rash noted. No erythema.    BP 128/72 (BP Location: Left Arm, Patient Position: Sitting, Cuff Size: Normal)   Pulse 87   Temp 98.6 F (37 C) (Oral)   Resp 18   Wt 145 lb 6.4 oz (66 kg)   SpO2 97%   BMI 22.27 kg/m  Wt Readings from Last 3 Encounters:  03/23/18 145 lb 6.4 oz (66 kg)  01/30/18 148 lb (67.1 kg)  12/03/17 155 lb (70.3 kg)     Lab Results  Component Value Date   WBC 4.7 03/18/2018   HGB 13.8 03/18/2018   HCT 41.6 03/18/2018   PLT 234.0 03/18/2018   GLUCOSE 91 03/18/2018   CHOL 197 03/18/2018   TRIG 106.0 03/18/2018   HDL 47.20 03/18/2018   LDLDIRECT 110.0 02/11/2017   LDLCALC 129 (H) 03/18/2018   ALT 13 03/18/2018   AST 17 03/18/2018   NA 141 03/18/2018   K 4.2 03/18/2018   CL 106 03/18/2018   CREATININE 0.71 03/18/2018   BUN 17 03/18/2018   CO2 24 03/18/2018   TSH 4.65 (H) 03/18/2018   HGBA1C 5.4 07/15/2017       Assessment & Plan:   Problem List Items Addressed This Visit    Asthma    Breathing stable.  Follow.        Hyperbilirubinemia    Follow liver panel.        Relevant Orders   Hepatic function panel   Hypercholesterolemia    Low carb  diet and exercise.  Follow lipid panel.  Calculated risk 6%.         Other Visit Diagnoses    Elevated TSH    -  Primary   Slight increased last check. Schedle f/u tsh check.  Relevant Orders   TSH       Einar Pheasant, MD

## 2018-03-24 ENCOUNTER — Encounter: Payer: BLUE CROSS/BLUE SHIELD | Admitting: Physical Therapy

## 2018-03-28 ENCOUNTER — Encounter: Payer: Self-pay | Admitting: Internal Medicine

## 2018-03-29 NOTE — Assessment & Plan Note (Signed)
Low carb diet and exercise.  Follow lipid panel.  Calculated risk 6%.

## 2018-03-29 NOTE — Assessment & Plan Note (Signed)
Follow liver panel.  

## 2018-03-29 NOTE — Assessment & Plan Note (Signed)
Breathing stable.  Follow.    

## 2018-03-31 ENCOUNTER — Ambulatory Visit: Payer: BLUE CROSS/BLUE SHIELD | Admitting: Physical Therapy

## 2018-03-31 ENCOUNTER — Ambulatory Visit: Payer: BLUE CROSS/BLUE SHIELD | Attending: Internal Medicine | Admitting: Physical Therapy

## 2018-03-31 DIAGNOSIS — M4125 Other idiopathic scoliosis, thoracolumbar region: Secondary | ICD-10-CM | POA: Insufficient documentation

## 2018-03-31 DIAGNOSIS — M25551 Pain in right hip: Secondary | ICD-10-CM | POA: Diagnosis present

## 2018-03-31 DIAGNOSIS — R279 Unspecified lack of coordination: Secondary | ICD-10-CM | POA: Insufficient documentation

## 2018-03-31 DIAGNOSIS — M791 Myalgia, unspecified site: Secondary | ICD-10-CM | POA: Diagnosis present

## 2018-03-31 DIAGNOSIS — M533 Sacrococcygeal disorders, not elsewhere classified: Secondary | ICD-10-CM | POA: Insufficient documentation

## 2018-03-31 DIAGNOSIS — R293 Abnormal posture: Secondary | ICD-10-CM | POA: Diagnosis present

## 2018-03-31 DIAGNOSIS — M25561 Pain in right knee: Secondary | ICD-10-CM | POA: Insufficient documentation

## 2018-03-31 DIAGNOSIS — M6281 Muscle weakness (generalized): Secondary | ICD-10-CM | POA: Diagnosis present

## 2018-04-02 NOTE — Therapy (Signed)
[place] MAIN [facility] SERVICES [address] [place], [place], [address] Phone: [phone]   Fax:  [phone]  Physical Therapy Treatment  Patient Details  Name: Lynn Mann MRN: [id number] Date of Birth: 1950/10/05 Referring Provider: Einar Pheasant, MD    Encounter Date: 03/31/2018  PT End of Session - 04/02/18 0829    Visit Number  13    Date for PT Re-Evaluation  05/05/18    PT Start Time  1430    PT Stop Time  1503    PT Time Calculation (min)  33 min    Activity Tolerance  Patient tolerated treatment well    Behavior During Therapy  [facility] for tasks assessed/performed       Past Medical History:  Diagnosis Date  . Arthritis   . Chicken pox   . Cholecystitis 11/2011   Did not require sgy - Dr. Staci Acosta - [facility]  (cholelithiasis)  . H/O Clostridium difficile infection   . IBS (irritable bowel syndrome)   . MRSA exposure 2005   Spider bite  . MVP (mitral valve prolapse)    Stable - Dr. Ubaldo Glassing  . Rheumatic fever     Past Surgical History:  Procedure Laterality Date  . CATARACT EXTRACTION  [id number]  . MUSCLE BIOPSY      There were no vitals filed for this visit.  Subjective Assessment - 04/02/18 0829    Subjective  Pt went to the beach and was able to do her exercises regularly. Pt had barely any leakage and no "flooding" episodes prior to and during her travels. Pt walked alot the past 5 days. Pt had swollen ankles for two days prior to the onset of a "flooding" episode. Pt had lots of salty foods and took aspirin the night she had the leakage.     Pt had seen her cardiologist about sleep study and she learned she did not have sleep apnea.  Pt still gets up 3-4 times at night to use the bathroom.   Pt does not have leakage while sleeping , only when she gets up in the middle of the night to head to the bathroom.  Before going to the beach, pt was able to have control and prevent leakage before getting to the bathroom.    Pt has  had occasional swollen ankles in the past when she had increased salt intake. Pt will be seeing her nutritionist this coming Friday.   Today the ankles are not has swollen.   Pertinent History  Pt had a colonscopy which showed internal hemorrhoids, polyp that were benign and removed. Pt had lost 40 lb between May 2018 to current. Pt has felt she lost muscle mass through her weight loss which she did not following any diets. Pt is now trying has enough protein for build muscles but she has not worked with a nutritionist.            [facility] PT Assessment - 04/02/18 0829      Observation/Other Assessments   Observations  reviewed HEP, proper technique, cued for proper sitting posture      Observation/Other Assessments-Edema    Edema  Figure 8      Figure 8 Edema   Figure 8 - Right   60 cm    Figure 8 - Left   56 cm                   [facility] Adult PT Treatment/Exercise - 04/02/18 9381  Therapeutic Activites    Other Therapeutic Activities  problem solved pt's relapse of incontinence at night and ankle swelling.                   PT Long Term Goals - 03/16/18 1606      PT LONG TERM GOAL #1   Title  Pt will decrease her PFDI score from 47% to < 42% in order to improve pelvic floor function ( 6/4/: 36%)     Time  8    Period  Weeks    Status  Achieved      PT LONG TERM GOAL #2   Title  Pt will report compliance with proper toileting technique and not squatting/ hovering over the toilet in public restrooms in order to promote pelvic health    Time  2    Period  Weeks    Status  Achieved      PT LONG TERM GOAL #3   Title  Pt will demo IND with pelvic floor exercises     Time  12    Period  Weeks    Status  On-going      PT LONG TERM GOAL #4   Title  Pt will be referred to community fitness classes a senior center for overall strengthening     Time  4    Period  Weeks    Status  Achieved      PT LONG TERM GOAL #5   Title  Pt will improve her bowel  movements from every 3 days to daily and Stool Type  Bristol Scale from  Type 4 ( 50%) and Type 7 ( 50%) to Type 4 ( 75% of the time) in order to improve GI/ pelvic function   ( 6/4: every other day, Type 4 ( 75%) , 7/16: with colace)      Time  8    Period  Weeks    Status  Achieved      PT LONG TERM GOAL #6   Title  Pt will demo less pronation in B with gait and mini squats in order to miminize risk for knee and pelvic floor dysfunctions    Time  8    Period  Weeks    Status  Achieved      PT LONG TERM GOAL #7   Title  Pt will be compliant with scoliosis HEP to minimize L midback ( shoulder blade area)  while sitting     Time  12    Period  Weeks    Status  On-going            Plan - 04/02/18 3299    Clinical Impression Statement Pt has resumed compliance with her exercises this past week. However, pt has had a relapse of "flooding" episode getting getting up in the middle of the night to use the toilet. Pt had barely any leakage and no "flooding" episodes in the middle of the night prior to and during her travels to the beach the previous week because pt had eliminated caffeine intake in the evening as recommended and showed improved pelvic floor mm coordination and strength across the past PT sessions. Pt walked a lot and also ate more salty foods than usual while at the beach. Pt noticed her legs swelling up.  Pt has had occasional swollen ankles in the past when she had increased salt intake.   Focused today on problem-solving her the cause to her incontinence relapse given  she had had improvements the past weeks. Suspect pt's recent increased salt intake during her beach trip with family may be related to onset of swollen ankles and increased urine production but will wait for pt to document her food intake across one week and to meet with nutritionist on proper diet that is appropriate for her and her medical conditions. Pt has been advised in the past to minimize her salt intake.  Plan to review pt's food diary and documentation of her leakage and increased urine episodes at upcoming session.   Pt continues to benefit from skilled PT.      PT Frequency  1x / week    PT Duration  12 weeks    PT Treatment/Interventions  Neuromuscular re-education;Balance training;Therapeutic exercise;Orthotic Fit/Training;Functional mobility training;Moist Heat;Traction;Therapeutic activities;Patient/family education;Manual techniques    Consulted and Agree with Plan of Care  Patient       Patient will benefit from skilled therapeutic intervention in order to improve the following deficits and impairments:  Postural dysfunction, Increased muscle spasms, Decreased scar mobility, Decreased coordination, Decreased mobility, Decreased range of motion, Decreased endurance, Decreased activity tolerance, Difficulty walking, Decreased safety awareness, Decreased balance, Abnormal gait, Decreased strength, Hypomobility, Improper body mechanics  Visit Diagnosis: Myalgia  Muscle weakness (generalized)  Abnormal posture  Other idiopathic scoliosis, thoracolumbar region  Pain in right hip  Sacrococcygeal disorders, not elsewhere classified     Problem List Patient Active Problem List   Diagnosis Date Noted  . Itching 07/21/2017  . Asthma 07/16/2016  . Urinary incontinence 07/16/2016  . SOB (shortness of breath) 04/01/2016  . Bilateral shoulder pain 02/24/2016  . Neck fullness 01/28/2016  . Routine general medical examination at a health care facility 07/25/2015  . Health care maintenance 10/09/2014  . Neck pain 11/26/2013  . Hypercholesterolemia 11/26/2013  . History of colonic polyps 05/11/2013  . Hyperbilirubinemia 01/18/2013  . GERD (gastroesophageal reflux disease) 12/03/2012  . Cholelithiasis 11/07/2012  . IBS (irritable bowel syndrome) 11/07/2012  . History of rheumatic fever 08/28/2012  . MVP (mitral valve prolapse) 08/28/2012    Jerl Mina ,PT, DPT,  E-RYT  04/02/2018, 8:36 AM  [place] MAIN [facility] SERVICES [address] [place] [address] [place], [place], [address] Phone: [phone]   Fax:  [phone]  Name: BRANDA CHAUDHARY MRN: [id number] Date of Birth: 1950-08-23

## 2018-04-03 ENCOUNTER — Encounter: Payer: Self-pay | Admitting: Dietician

## 2018-04-03 ENCOUNTER — Encounter: Payer: BLUE CROSS/BLUE SHIELD | Attending: Internal Medicine | Admitting: Dietician

## 2018-04-03 VITALS — Ht 67.75 in | Wt 145.1 lb

## 2018-04-03 DIAGNOSIS — E7800 Pure hypercholesterolemia, unspecified: Secondary | ICD-10-CM

## 2018-04-03 NOTE — Progress Notes (Signed)
Medical Nutrition Therapy: Visit start time: 8413  end time: 1220  Assessment:  Diagnosis: hyperlipidemia Past medical history: hypoglycemia Psychosocial issues/ stress concerns: none  Preferred learning method:  . Auditory . Visual . Hands-on  Current weight: 145.1lbs Height: 5'7.75" Medications, supplements: reconciled list in medical record  Progress and evaluation: Patient has been following low- very low carb diet in order to decrease triglycerides over the past several months. She has noticed improvement, Triglycerides were 106 at last test on 02/16/18. Other results-- Total cholesterol 197, HDL 47, LDL 129. She reports having lost about 40lbs since 07/2017 with diet changes. Weight gain occurred when caring for parents in ill health for an extended time that involved staying awake at night, and patient then began eating higher-calorie foods such as ice cream. Patient has a niece with non-alcoholic cirrhosis, in poor health, and patient is concerned about possible genetic predisposition for liver disease; she wants to continue losing abdominal weight and percent body fat. Patient reports she has weighed about 105-116lbs most of her adult life, until her weight gain several years ago.   Physical activity: walking and PT 20-60 minutes, 7 days per week  Dietary Intake:  Usual eating pattern includes 2 meals and 2 snacks per day. Dining out frequency: 11-14 meals per week (kitchen is scheduled to be remodeled)  Breakfast: oatmeal with walnuts, a few cranberries, or boiled egg, or coffee with oatmeal and cinnamonas meal or am snack; sometimes forgets to eat Snack: none  Lunch: none if late breakfast; occasional lunch at [employer]: yogurt, nuts often; blueberries during summer; frozen evaporated milk with cinnamon Supper: guacamole and beans; shrimp cocktail with avocado; Steve's rest. -- brisket and/or cabbage; salad and/or chicken; [employer] -- grilled chicken, green beans, cabbage or  salad, occasionally flounder; burger no bun; Red bowl -- avocado salad, edamame, veg spring roll with sauce, tamka gai su; [employer] 1x a week -- cabbage or sauerkraut, green beans, sometimes small portion meat; sometimes pinto beans; [employer] -- blackeyed peas, green bbeans, cabbage, cucumber salad, rarely chicken; fried oyster appetizer occasionally; Skids -- souvlaki basked or hamburger steak with onions, green beans; soup and salad often Snack: nuts -- walnuts Beverages: water, coffee sometimes; occasionally about 2oz soda-- coke or ginger ale  Nutrition Care Education: Topics covered: hyperlipidemia, weight control Basic nutrition: basic food groups, appropriate nutrient balance, appropriate meal and snack schedule, general nutrition guidelines    Weight control: identifying healthy weight, determining reasonable weight goal, healthy macronutrient balance; provided basic guidance for 1300kcal daily intake with 40% CHO, 30% protein, and 30% fat, discussed food and meal and snack options to achieve healthy balance; role of carbohydrate and sources of whole grains and fiber.  Advanced nutrition: dining out-- food portions, fat, sugar, and sodium added to restaurant foods Hyperlipidemia:  target goals for lipids, healthy and unhealthy fats, role of fiber,  food sources of plant sterols, fiber, phytochemicals; DASH and Mediterranean diet guidelines Other:  Goals for low-sodium eating per patient request  Nutritional Diagnosis:  [place]-2.2 Altered nutrition-related laboratory As related to hyperlipidemia.  As evidenced by patient with elevated LDL, history of elevated Triglycerides.  Intervention: Instruction as noted above.   Encouraged patient to gradually increase intake of healthy carbohydrate foods.    Patient is currently making healthy food choices; protein and carbohydrate intake could be slightly ow with some skipped meals and self-limited choices.    Set goals with direction from patient.       Education Materials given:  . Plate Planner  with food lists . DASH diet  . Mediterranean Diet ([place]) . Goals/ instructions   Learner/ who was taught:  . Patient   Level of understanding: Marland Kitchen Verbalizes/ demonstrates competency   Demonstrated degree of understanding via:   Teach back Learning barriers: . None  Willingness to learn/ readiness for change: . Eager, change in progress   Monitoring and Evaluation:  Dietary intake, exercise, blood lipids, and body weight      follow up: 06/04/18

## 2018-04-03 NOTE — Patient Instructions (Addendum)
   Gradually increase healthy carbs by adding 1-2 servings of whole grains, beans, fruits to meals.   Eat every 4-6 hours, at least 3 times a day.   Include lean protein along with vegetables, and fruit or grain when eating a meal or snack.   Great job making healthy food choices, keep it up!!  Regular exercise also helps reduce body fat and build lean body weight.   I you make a protein drink for a meal, make sure to include a source of protein like tofu, [place] yogurt, or powdered protein supplement -- along with oats, seeds, fruit, and/or vegetables for healthy and balanced nutrition.

## 2018-04-15 ENCOUNTER — Ambulatory Visit: Payer: BLUE CROSS/BLUE SHIELD | Admitting: Physical Therapy

## 2018-04-15 DIAGNOSIS — R293 Abnormal posture: Secondary | ICD-10-CM

## 2018-04-15 DIAGNOSIS — R279 Unspecified lack of coordination: Secondary | ICD-10-CM

## 2018-04-15 DIAGNOSIS — M533 Sacrococcygeal disorders, not elsewhere classified: Secondary | ICD-10-CM

## 2018-04-15 DIAGNOSIS — M791 Myalgia, unspecified site: Secondary | ICD-10-CM | POA: Diagnosis not present

## 2018-04-15 DIAGNOSIS — M4125 Other idiopathic scoliosis, thoracolumbar region: Secondary | ICD-10-CM

## 2018-04-15 DIAGNOSIS — M25561 Pain in right knee: Secondary | ICD-10-CM

## 2018-04-15 DIAGNOSIS — M6281 Muscle weakness (generalized): Secondary | ICD-10-CM

## 2018-04-15 DIAGNOSIS — M25551 Pain in right hip: Secondary | ICD-10-CM

## 2018-04-15 NOTE — Patient Instructions (Signed)
Sit to stand:   push thighs out into palms placed by outside of thigh    exhale up 10 reps x 3  ______________  Skis  Use the lines on the linoleum for foot and knee placement  Make sure toes, knee, and hips are strength  ___________  1 hour before bed:    have a 30 min routine of propping legs up against wall/ book self  pump ankles 10 min, 6  Min deep core exercise, 10 min doing nothing or pump ankles again   * Empty your bladder after this exercise   Then get ready for bed   ______________    Transition from standing to floor: Wide squat like you are about to pick something up from the floor --> crawl hand down on thigh and then reach other hand onto the ground (all fours)  To get up, all fours--> lifts hips in to Downward Facing Dog  and walk hands backwards to feet --> mini quat --> hands on thighs, then hips then pause HERE  to avoid (moving too quickly up/ blood rush) -->  knees glide forward and roll  Hips up instead of hinging spine up   * KEEP YOUR HEAD AND HEART LEVELLED , NEVER LETTING YOUR HEAD GET BELOW YOUR HEART

## 2018-04-15 NOTE — Therapy (Signed)
[place] MAIN [facility] SERVICES [address] [place], [place], [address] Phone: [phone]   Fax:  [phone]  Physical Therapy Treatment  Patient Details  Name: Lynn Mann MRN: [id number] Date of Birth: Apr 10, 1951 Referring Provider: Einar Pheasant, MD    Encounter Date: 04/15/2018  PT End of Session - 04/15/18 1158    Visit Number  14    Date for PT Re-Evaluation  05/05/18    PT Start Time  1117    PT Stop Time  1210    PT Time Calculation (min)  53 min    Activity Tolerance  Patient tolerated treatment well    Behavior During Therapy  [facility] for tasks assessed/performed       Past Medical History:  Diagnosis Date  . Arthritis   . Chicken pox   . Cholecystitis 11/2011   Did not require sgy - Dr. Staci Acosta - [facility]  (cholelithiasis)  . H/O Clostridium difficile infection   . IBS (irritable bowel syndrome)   . MRSA exposure 2005   Spider bite  . MVP (mitral valve prolapse)    Stable - Dr. Ubaldo Glassing  . Rheumatic fever     Past Surgical History:  Procedure Laterality Date  . CATARACT EXTRACTION  [id number]  . MUSCLE BIOPSY      There were no vitals filed for this visit.  Subjective Assessment - 04/15/18 1121    Subjective  Pt saw her her nutritionist this past week.  Last week, her legs swelled up again her B legs all the way to her knees but it went away. This swelling occurs by 9am in the morning. On the nights prior to these mornings , her Depends are dry while layiong in bed but become wet and "flooded" once she gets up and walking closer to the bathroom. Pt was able to not leak before gettting to the bathroom but she had 2 nights when she leaked again before getting to the bathroom.  These incontinence epsiodes still is occurring after pt tried to give up her green tea altogether instead of drinking prior to bed.  Prior to PT, pt had leakage epsiodes 3x /night for 4 x week , currently, pt has leaked 1-3 x / night across 2 x week.       Pertinent History  Pt had a colonscopy which showed internal hemorrhoids, polyp that were benign and removed. Pt had lost 40 lb between May 2018 to current. Pt has felt she lost muscle mass through her weight loss which she did not following any diets. Pt is now trying has enough protein for build muscles but she has not worked with a nutritionist.            [facility] PT Assessment - 04/15/18 1154      Figure 8 Edema   Figure 8 - Right   56.5 cm    Figure 8 - Left   56.5 cm       Floor to Stand   Comments  poor alignment on front knee with half k neeling and breathholding       Strength   Overall Strength Comments  hip abd 4-/5 , ext B                    [facility] Adult PT Treatment/Exercise - 04/15/18 1132      Neuro Re-ed    Neuro Re-ed Details   see pt instructions  PT Education - 04/15/18 1154    Education provided  Yes    Education Details  HEP    Person(s) Educated  Patient    Methods  Explanation;Demonstration;Handout;Verbal cues;Tactile cues    Comprehension  Verbalized understanding;Returned demonstration          PT Long Term Goals - 04/15/18 1206      PT LONG TERM GOAL #1   Title  Pt will decrease her PFDI score from 47% to < 42% in order to improve pelvic floor function ( 6/4/: 36%)     Time  8    Period  Weeks    Status  Achieved      PT LONG TERM GOAL #2   Title  Pt will report compliance with proper toileting technique and not squatting/ hovering over the toilet in public restrooms in order to promote pelvic health    Time  2    Period  Weeks    Status  Achieved      PT LONG TERM GOAL #3   Title  Pt will demo IND with pelvic floor exercises     Time  12    Period  Weeks    Status  On-going      PT LONG TERM GOAL #4   Title  Pt will be referred to community fitness classes a senior center for overall strengthening     Time  4    Period  Weeks    Status  Achieved      PT LONG TERM GOAL #5   Title  Pt will improve  her bowel movements from every 3 days to daily and Stool Type  Bristol Scale from  Type 4 ( 50%) and Type 7 ( 50%) to Type 4 ( 75% of the time) in order to improve GI/ pelvic function   ( 6/4: every other day, Type 4 ( 75%) , 7/16: with colace)      Time  8    Period  Weeks    Status  Achieved      Additional Long Term Goals   Additional Long Term Goals  Yes      PT LONG TERM GOAL #6   Title  Pt will demo less pronation in B with gait and mini squats in order to miminize risk for knee and pelvic floor dysfunctions    Time  8    Period  Weeks    Status  Achieved      PT LONG TERM GOAL #7   Title  Pt will be compliant with scoliosis HEP to minimize L midback ( shoulder blade area)  while sitting     Time  12    Period  Weeks    Status  On-going      PT LONG TERM GOAL #8   Title  Pt will report decreased leakage when gettign to the bathroom  1-3x / night across 2 x week to less than 2x/ night across < 2 x night in order to improve sleep and  pelvic function    Time  8    Period  Weeks    Status  New            Plan - 04/15/18 1129    Clinical Impression Statement   Pt shows improved hip/ LE strength. Pt is still working on decreasing urge incontinence at night. Prior to PT, pt had leakage epsiodes 3x /night for 4 x week , currently, pt has leaked 1-3  x / night across 2 x week.  Pt has noticed BLE swelling on the mornings last week ( 2 x )  when she had leakage to the toilet in the middle of the night.  Pt has met with her nutritionist and pt has learned to eat properly for protein, carbs, and salt.  Provided a technique ( legs elevation, ankle pumps) to minimize swelling in BLE and nighttime urge incontinence because pt reported she has noticed more urge incontinence with ankle swelling this past week.  Pt's ankle swelling remained equal today with circumference measurements. Reviewed prior HEP and pt required propioception cues but demo'd improved alignment and stability. Pt demo'd a  need to downgrade with floor <> stand transfer due to UE weakness and requiring position to enaure leveled position of heart and head due to cardiac Hx. Pt had felt slightly dizzy with the prior technique which had her head lower than her heart. Pt had no more dizziness upon standing nor with modified position. Pt consented to have DPT record the exercises on her recorded her phone for pt to review proper form and to yield better compliance. Pt continues to benefit from skilled PT.        PT Frequency  1x / week    PT Duration  12 weeks    PT Treatment/Interventions  Neuromuscular re-education;Balance training;Therapeutic exercise;Orthotic Fit/Training;Functional mobility training;Moist Heat;Traction;Therapeutic activities;Patient/family education;Manual techniques    Consulted and Agree with Plan of Care  Patient       Patient will benefit from skilled therapeutic intervention in order to improve the following deficits and impairments:  Postural dysfunction, Increased muscle spasms, Decreased scar mobility, Decreased coordination, Decreased mobility, Decreased range of motion, Decreased endurance, Decreased activity tolerance, Difficulty walking, Decreased safety awareness, Decreased balance, Abnormal gait, Decreased strength, Hypomobility, Improper body mechanics  Visit Diagnosis: Myalgia  Muscle weakness (generalized)  Abnormal posture  Other idiopathic scoliosis, thoracolumbar region  Pain in right hip  Sacrococcygeal disorders, not elsewhere classified  Unspecified lack of coordination  Right knee pain, unspecified chronicity     Problem List Patient Active Problem List   Diagnosis Date Noted  . Itching 07/21/2017  . Asthma 07/16/2016  . Urinary incontinence 07/16/2016  . SOB (shortness of breath) 04/01/2016  . Bilateral shoulder pain 02/24/2016  . Neck fullness 01/28/2016  . Routine general medical examination at a health care facility 07/25/2015  . Health care  maintenance 10/09/2014  . Neck pain 11/26/2013  . Hypercholesterolemia 11/26/2013  . History of colonic polyps 05/11/2013  . Hyperbilirubinemia 01/18/2013  . GERD (gastroesophageal reflux disease) 12/03/2012  . Cholelithiasis 11/07/2012  . IBS (irritable bowel syndrome) 11/07/2012  . History of rheumatic fever 08/28/2012  . MVP (mitral valve prolapse) 08/28/2012    Jerl Mina ,PT, DPT, E-RYT  04/16/2018, 10:25 AM  [place] MAIN [facility] SERVICES [address] [place], [place], [address] Phone: [phone]   Fax:  [phone]  Name: Lynn Mann MRN: [id number] Date of Birth: 23-Nov-1950

## 2018-04-28 ENCOUNTER — Ambulatory Visit: Payer: BLUE CROSS/BLUE SHIELD | Attending: Internal Medicine | Admitting: Physical Therapy

## 2018-04-28 DIAGNOSIS — M791 Myalgia, unspecified site: Secondary | ICD-10-CM | POA: Diagnosis present

## 2018-04-28 DIAGNOSIS — R293 Abnormal posture: Secondary | ICD-10-CM | POA: Insufficient documentation

## 2018-04-28 DIAGNOSIS — M25551 Pain in right hip: Secondary | ICD-10-CM | POA: Diagnosis present

## 2018-04-28 DIAGNOSIS — M533 Sacrococcygeal disorders, not elsewhere classified: Secondary | ICD-10-CM | POA: Diagnosis present

## 2018-04-28 DIAGNOSIS — M6281 Muscle weakness (generalized): Secondary | ICD-10-CM

## 2018-04-28 DIAGNOSIS — M4125 Other idiopathic scoliosis, thoracolumbar region: Secondary | ICD-10-CM | POA: Diagnosis present

## 2018-04-28 NOTE — Patient Instructions (Addendum)
  Stretching R low back  In bed 1) Reclined twist for hips and side of the hips/ legs  Lay on your back, knees bend Scoot hips to the R , leave shoulders in place Drop knees to the L side resting onto pillows to keep leg at the same width of hips Pillow under L thigh to minimize too much strain   5 breath here    2)   REVERSE Warrior    :   Feet are hip width apart, R foot one behind like you are on ski tracks,  L knee bent over ankle but not more forward then the ankle.  Make sure 50% weight is in the front foot/leg , 50% weight is the back foot/ leg     R hand on L hip.  Inhale lengthen spine first and reach up with R    Exhale turn navel to the L then the ribcage turns, look at the other wall. Rest R hand on L thigh  Keep maintaining  50% weight is in the front foot/leg , 50% weight is the back foot/ leg  And make sure the front knee is still pointed in the toe line of the 2nd toe.    5 breaths here.    Modified thread the needle at a kitchen /counter/ table   Knees bent, both hands at counter, pulling butt back to lengthen spine R hand on L thigh,  inhale lengthen, exhale twist at midback , looking under L armpit  5 breath       . Stretching the upper L neck / shoulder  L palm facing side body  Lower hand and arm down away from ears,  Chin tuck -> Side flexion with head to R and L ( like holding phone)   5 reps     ___  In yoga classes, raise the R arm up in "V" position when everyone else is holding both arms up  Tree pose: Foot  not on knee Pushing heel against  standing leg

## 2018-04-29 ENCOUNTER — Other Ambulatory Visit: Payer: Self-pay | Admitting: Internal Medicine

## 2018-04-29 DIAGNOSIS — Z1231 Encounter for screening mammogram for malignant neoplasm of breast: Secondary | ICD-10-CM

## 2018-04-29 NOTE — Therapy (Signed)
[place] MAIN [facility] SERVICES [address] [place], [place], [address] Phone: [phone]   Fax:  [phone]  Physical Therapy Treatment  Patient Details  Name: TANINA BARB MRN: [id number] Date of Birth: [birth year] Referring Provider (PT): Einar Pheasant, MD    Encounter Date: 04/28/2018  PT End of Session - 04/28/18 1514    Visit Number  15    Date for PT Re-Evaluation  05/05/18    PT Start Time  1410    PT Stop Time  1510    PT Time Calculation (min)  60 min    Activity Tolerance  Patient tolerated treatment well    Behavior During Therapy  [facility] for tasks assessed/performed       Past Medical History:  Diagnosis Date  . Arthritis   . Chicken pox   . Cholecystitis 11/2011   Did not require sgy - Dr. Staci Acosta - [facility]  (cholelithiasis)  . H/O Clostridium difficile infection   . IBS (irritable bowel syndrome)   . MRSA exposure 2005   Spider bite  . MVP (mitral valve prolapse)    Stable - Dr. Ubaldo Glassing  . Rheumatic fever     Past Surgical History:  Procedure Laterality Date  . CATARACT EXTRACTION  [id number]  . MUSCLE BIOPSY      There were no vitals filed for this visit.  Subjective Assessment - 04/28/18 1411    Subjective  Pt reported the technique she learned from last session has helped to decreased her flooding epsiodes when getting up at night to sue the bathroom. The episodes would occur 2-3 x per week and for past week, there was only a dribble in her pads.  During the day, she has no problems at all.  Pt noticed low back pain this past week on the R.  In the mornings this LBP 5/10 and feels stiff. After she moves around a little bit, it straightens out.      Pertinent History  Pt had a colonscopy which showed internal hemorrhoids, polyp that were benign and removed. Pt had lost 40 lb between May 2018 to current. Pt has felt she lost muscle mass through her weight loss which she did not following any diets. Pt is now trying has  enough protein for build muscles but she has not worked with a nutritionist.            [facility] PT Assessment - 04/29/18 1012      Palpation   Spinal mobility  convex at R thoracic, L lumbar , scapular dyskinesis with L downward rotation     Palpation comment  iliac crest levelled. increased QL mm tightness on R > L , upper trap/ levator more tight on L > R                    [facility] Adult PT Treatment/Exercise - 04/29/18 1012      Neuro Re-ed    Neuro Re-ed Details   see pt instructions      Exercises   Exercises  --   see pt instructions     Manual Therapy   Manual therapy comments  STM/ MWM at problem areas of  R lumbar and L shoulder/neck              PT Education - 04/29/18 1012    Education provided  Yes    Education Details  HEP    Person(s) Educated  Patient    Methods  Explanation;Demonstration;Tactile cues;Verbal cues;Handout    Comprehension  Returned demonstration;Verbalized understanding          PT Long Term Goals - 04/15/18 1206      PT LONG TERM GOAL #1   Title  Pt will decrease her PFDI score from 47% to < 42% in order to improve pelvic floor function ( 6/4/: 36%)     Time  8    Period  Weeks    Status  Achieved      PT LONG TERM GOAL #2   Title  Pt will report compliance with proper toileting technique and not squatting/ hovering over the toilet in public restrooms in order to promote pelvic health    Time  2    Period  Weeks    Status  Achieved      PT LONG TERM GOAL #3   Title  Pt will demo IND with pelvic floor exercises     Time  12    Period  Weeks    Status  On-going      PT LONG TERM GOAL #4   Title  Pt will be referred to community fitness classes a senior center for overall strengthening     Time  4    Period  Weeks    Status  Achieved      PT LONG TERM GOAL #5   Title  Pt will improve her bowel movements from every 3 days to daily and Stool Type  Bristol Scale from  Type 4 ( 50%) and Type 7 ( 50%) to Type 4 (  75% of the time) in order to improve GI/ pelvic function   ( 6/4: every other day, Type 4 ( 75%) , 7/16: with colace)      Time  8    Period  Weeks    Status  Achieved      Additional Long Term Goals   Additional Long Term Goals  Yes      PT LONG TERM GOAL #6   Title  Pt will demo less pronation in B with gait and mini squats in order to miminize risk for knee and pelvic floor dysfunctions    Time  8    Period  Weeks    Status  Achieved      PT LONG TERM GOAL #7   Title  Pt will be compliant with scoliosis HEP to minimize L midback ( shoulder blade area)  while sitting     Time  12    Period  Weeks    Status  On-going      PT LONG TERM GOAL #8   Title  Pt will report decreased leakage when gettign to the bathroom  1-3x / night across 2 x week to less than 2x/ night across < 2 x night in order to improve sleep and  pelvic function    Time  8    Period  Weeks    Status  New            Plan - 04/29/18 1015    Clinical Impression Statement  Pt is improving with her nighttime urge incontinence with use of the feet elevation technique. Addressed pt's LBP and L upper neck complaint today with manual Tx and HEP which pt tolerated. Provided scoliosis specific HEP for lengthening shortened sides of her curves . Plan to progress to strengthening the weaker side of her curve at next session. Addresed modifications to chair yoga class to minimize overuse  of L upper trap which is the higher shoulder 2/2 to cervicothoracic curve. Pt continues to benefit from skilled PT.     PT Frequency  1x / week    PT Duration  12 weeks    PT Treatment/Interventions  Neuromuscular re-education;Balance training;Therapeutic exercise;Orthotic Fit/Training;Functional mobility training;Moist Heat;Traction;Therapeutic activities;Patient/family education;Manual techniques    Consulted and Agree with Plan of Care  Patient       Patient will benefit from skilled therapeutic intervention in order to improve the  following deficits and impairments:  Postural dysfunction, Increased muscle spasms, Decreased scar mobility, Decreased coordination, Decreased mobility, Decreased range of motion, Decreased endurance, Decreased activity tolerance, Difficulty walking, Decreased safety awareness, Decreased balance, Abnormal gait, Decreased strength, Hypomobility, Improper body mechanics  Visit Diagnosis: Myalgia  Abnormal posture  Other idiopathic scoliosis, thoracolumbar region  Pain in right hip  Muscle weakness (generalized)  Sacrococcygeal disorders, not elsewhere classified     Problem List Patient Active Problem List   Diagnosis Date Noted  . Itching 07/21/2017  . Asthma 07/16/2016  . Urinary incontinence 07/16/2016  . SOB (shortness of breath) 04/01/2016  . Bilateral shoulder pain 02/24/2016  . Neck fullness 01/28/2016  . Routine general medical examination at a health care facility 07/25/2015  . Health care maintenance 10/09/2014  . Neck pain 11/26/2013  . Hypercholesterolemia 11/26/2013  . History of colonic polyps 05/11/2013  . Hyperbilirubinemia 01/18/2013  . GERD (gastroesophageal reflux disease) 12/03/2012  . Cholelithiasis 11/07/2012  . IBS (irritable bowel syndrome) 11/07/2012  . History of rheumatic fever 08/28/2012  . MVP (mitral valve prolapse) 08/28/2012    Jerl Mina ,PT, DPT, E-RYT  04/29/2018, 10:19 AM  [place] MAIN [facility] SERVICES [address] [place], [place], [address] Phone: [phone]   Fax:  [phone]  Name: SHAWNE BULOW MRN: [id number] Date of Birth: 1951/02/02

## 2018-05-11 ENCOUNTER — Other Ambulatory Visit (INDEPENDENT_AMBULATORY_CARE_PROVIDER_SITE_OTHER): Payer: BLUE CROSS/BLUE SHIELD

## 2018-05-11 DIAGNOSIS — R7989 Other specified abnormal findings of blood chemistry: Secondary | ICD-10-CM

## 2018-05-11 LAB — HEPATIC FUNCTION PANEL
ALT: 14 U/L (ref 0–35)
AST: 17 U/L (ref 0–37)
Albumin: 4.1 g/dL (ref 3.5–5.2)
Alkaline Phosphatase: 48 U/L (ref 39–117)
Bilirubin, Direct: 0.1 mg/dL (ref 0.0–0.3)
Total Bilirubin: 0.8 mg/dL (ref 0.2–1.2)
Total Protein: 7 g/dL (ref 6.0–8.3)

## 2018-05-11 LAB — TSH: TSH: 3.79 u[IU]/mL (ref 0.35–4.50)

## 2018-05-12 ENCOUNTER — Ambulatory Visit: Payer: BLUE CROSS/BLUE SHIELD | Admitting: Physical Therapy

## 2018-05-12 DIAGNOSIS — M791 Myalgia, unspecified site: Secondary | ICD-10-CM | POA: Diagnosis not present

## 2018-05-12 DIAGNOSIS — R293 Abnormal posture: Secondary | ICD-10-CM

## 2018-05-12 DIAGNOSIS — M4125 Other idiopathic scoliosis, thoracolumbar region: Secondary | ICD-10-CM

## 2018-05-12 NOTE — Patient Instructions (Signed)
To address upper scoliosis curve   To release midback pain/ muscle tensions:  Yoga block (2") behind midback longways , a block under head   Reposition into lengthen spine , pelvic neutral  Allow knees to move ~10-15 deg so to allow your body weight to hang over edge of the block and back to center, then the other side  10 min here.  _______  R forearm slides above head, standing perpendicular to the door  15 reps   Complimentary stretch at the door knob,  Knees unlocked, shifting hips to the R to lengthen the R flank   ______   Sleeping on incline pillow placed long ways to upper back ontop of another pilow ( picture taken)  Use a small rolled towel to fit under neck curve.

## 2018-05-12 NOTE — Therapy (Addendum)
[place] MAIN [facility] SERVICES [place], [place], [address] Phone: [phone]   Fax:  [phone]  Physical Therapy Treatment / Progress Note   Patient Details  Name: Lynn Mann MRN: [id number] Date of Birth: 05/10/1951 Referring Provider (PT): Einar Pheasant, MD    Encounter Date: 05/12/2018  PT End of Session - 05/12/18 1449    Visit Number  16    Date for PT Re-Evaluation  05/05/18    PT Start Time  1415    PT Stop Time  1506    PT Time Calculation (min)  51 min    Activity Tolerance  Patient tolerated treatment well    Behavior During Therapy  [facility] for tasks assessed/performed       Past Medical History:  Diagnosis Date  . Arthritis   . Chicken pox   . Cholecystitis 11/2011   Did not require sgy - Dr. Staci Acosta - [facility]  (cholelithiasis)  . H/O Clostridium difficile infection   . IBS (irritable bowel syndrome)   . MRSA exposure 2005   Spider bite  . MVP (mitral valve prolapse)    Stable - Dr. Ubaldo Glassing  . Rheumatic fever     Past Surgical History:  Procedure Laterality Date  . CATARACT EXTRACTION  [id number]  . MUSCLE BIOPSY      There were no vitals filed for this visit.  Subjective Assessment - 05/12/18 1452    Subjective  Pt reports her neck still hurts but has been doing her upper trap stretches. pt has been sleeping on new pillows which is higher on the edge and sinks deep for the head.  Pt has had less leakage when getting to the bathroom and been sleeping for longer periods.  The past 2 nights, pt has had to rush to get to the bathroom with 75% more leakage than when she had the leakage under control. Today during the daytime hours, pt had leakage.  Pt is still doing her pelvic floor squeezes.  Pt noticed she had trouble with constipation the past days. Pt strained today with bowel movements.      Pertinent History  Pt had a colonscopy which showed internal hemorrhoids, polyp that were benign and removed. Pt  had lost 40 lb between May 2018 to current. Pt has felt she lost muscle mass through her weight loss which she did not following any diets. Pt is now trying has enough protein for build muscles but she has not worked with a nutritionist.            [facility] PT Assessment - 05/12/18 1451      Palpation   Palpation comment  iliocostalis and interspinal mm on L C7-T5                   [facility] Adult PT Treatment/Exercise - 05/12/18 1452      Neuro Re-ed    Neuro Re-ed Details   see pt instructions      Exercises   Exercises  --   see pt instructions     Moist Heat Therapy   Number Minutes Moist Heat  5 Minutes   neck/ shoulder     Manual Therapy   Manual therapy comments  STM along areas of tightness indicated in assessment              PT Education - 05/12/18 1506    Education provided  Yes    Education Details  HEP  Person(s) Educated  Patient    Methods  Explanation    Comprehension  Verbalized understanding;Returned demonstration;Verbal cues required          PT Long Term Goals - 04/15/18 1206      PT LONG TERM GOAL #1   Title  Pt will decrease her PFDI score from 47% to < 42% in order to improve pelvic floor function ( 6/4/: 36%)     Time  8    Period  Weeks    Status  Achieved      PT LONG TERM GOAL #2   Title  Pt will report compliance with proper toileting technique and not squatting/ hovering over the toilet in public restrooms in order to promote pelvic health    Time  2    Period  Weeks    Status  Achieved      PT LONG TERM GOAL #3   Title  Pt will demo IND with pelvic floor exercises     Time  12    Period  Weeks    Status  On-going      PT LONG TERM GOAL #4   Title  Pt will be referred to community fitness classes a senior center for overall strengthening     Time  4    Period  Weeks    Status  Achieved      PT LONG TERM GOAL #5   Title  Pt will improve her bowel movements from every 3 days to daily and Stool Type  Bristol  Scale from  Type 4 ( 50%) and Type 7 ( 50%) to Type 4 ( 75% of the time) in order to improve GI/ pelvic function   ( 6/4: every other day, Type 4 ( 75%) , 7/16: with colace)      Time  8    Period  Weeks    Status  Achieved      Additional Long Term Goals   Additional Long Term Goals  Yes      PT LONG TERM GOAL #6   Title  Pt will demo less pronation in B with gait and mini squats in order to miminize risk for knee and pelvic floor dysfunctions    Time  8    Period  Weeks    Status  Achieved      PT LONG TERM GOAL #7   Title  Pt will be compliant with scoliosis HEP to minimize L midback ( shoulder blade area)  while sitting     Time  12    Period  Weeks    Status  On-going      PT LONG TERM GOAL #8   Title  Pt will report decreased leakage when getting to the bathroom  1-3x / night across 2 x week to less than 2x/ night across < 2 x night in order to improve sleep and  pelvic function    Time  8    Period  Weeks    Status  New            Plan - 05/12/18 1507    Clinical Impression Statement Pt has achieved 5 /8 goals and is progressing well towards remaining goals. PFDI score has decreased, bowel movements are getting better, and pelvic floor strengthening and coordination has helped with incontinence. Although pt has some improved continence when getting to the toilet at night, pt still reports increased urine volume with nighttime voiding. This issue has continued to  persist and thus, pt would benefit from a referral to see a urologist.   Pt is also getting her scoliosis related issues and overall strengthening addressed.   Pt demo'd decrease L spinal tightness post Tx which pt tolerated without complaints. Initiated R upper trap strengthening due to lowered R shoulder 2/2 upper scoliotic curve. Educated pt on minimizing constipation to not strain which is likelt  associated with her relapse of leakage. Advised pt to complete her food diary again and to consult with her  nutritionist and ensure proper fiber and water intake.  Pt continues to benefit from skilled PT.     PT Frequency  1x / week    PT Duration  12 weeks    PT Treatment/Interventions  Neuromuscular re-education;Balance training;Therapeutic exercise;Orthotic Fit/Training;Functional mobility training;Moist Heat;Traction;Therapeutic activities;Patient/family education;Manual techniques    Consulted and Agree with Plan of Care  Patient       Patient will benefit from skilled therapeutic intervention in order to improve the following deficits and impairments:  Postural dysfunction, Increased muscle spasms, Decreased scar mobility, Decreased coordination, Decreased mobility, Decreased range of motion, Decreased endurance, Decreased activity tolerance, Difficulty walking, Decreased safety awareness, Decreased balance, Abnormal gait, Decreased strength, Hypomobility, Improper body mechanics  Visit Diagnosis: Myalgia  Abnormal posture  Other idiopathic scoliosis, thoracolumbar region     Problem List Patient Active Problem List   Diagnosis Date Noted  . Itching 07/21/2017  . Asthma 07/16/2016  . Urinary incontinence 07/16/2016  . SOB (shortness of breath) 04/01/2016  . Bilateral shoulder pain 02/24/2016  . Neck fullness 01/28/2016  . Routine general medical examination at a health care facility 07/25/2015  . Health care maintenance 10/09/2014  . Neck pain 11/26/2013  . Hypercholesterolemia 11/26/2013  . History of colonic polyps 05/11/2013  . Hyperbilirubinemia 01/18/2013  . GERD (gastroesophageal reflux disease) 12/03/2012  . Cholelithiasis 11/07/2012  . IBS (irritable bowel syndrome) 11/07/2012  . History of rheumatic fever 08/28/2012  . MVP (mitral valve prolapse) 08/28/2012    Jerl Mina ,PT, DPT, E-RYT  05/12/2018, 3:11 PM  [place] MAIN [facility] SERVICES [address] [place], [place], [address] Phone: [phone]   Fax:   [phone]  Name: Lynn Mann MRN: [id number] Date of Birth: 1951/05/17

## 2018-05-27 ENCOUNTER — Ambulatory Visit
Admission: RE | Admit: 2018-05-27 | Discharge: 2018-05-27 | Disposition: A | Discharge status: Home / Self Care | Payer: BLUE CROSS/BLUE SHIELD | Source: Ambulatory Visit | Attending: Internal Medicine | Admitting: Internal Medicine

## 2018-05-27 DIAGNOSIS — Z1231 Encounter for screening mammogram for malignant neoplasm of breast: Secondary | ICD-10-CM | POA: Diagnosis present

## 2018-05-27 IMAGING — MG MM DIGITAL SCREENING BILAT W/ TOMO W/ CAD
6 of 10 series · 6 of 30 positions shown · non-contrast
Comparison: Previous exam(s).

CLINICAL DATA: Screening.

EXAM:
DIGITAL SCREENING BILATERAL MAMMOGRAM WITH TOMO AND CAD

[L XCCL synth-2D]
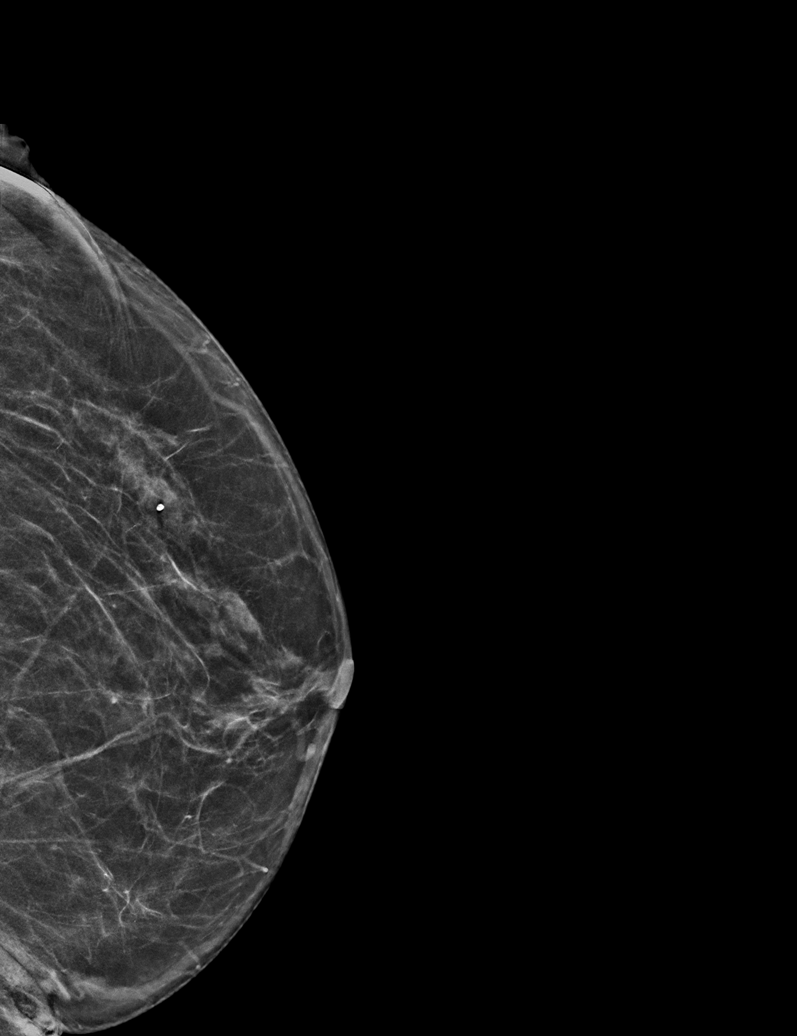

[L MLO synth-2D]
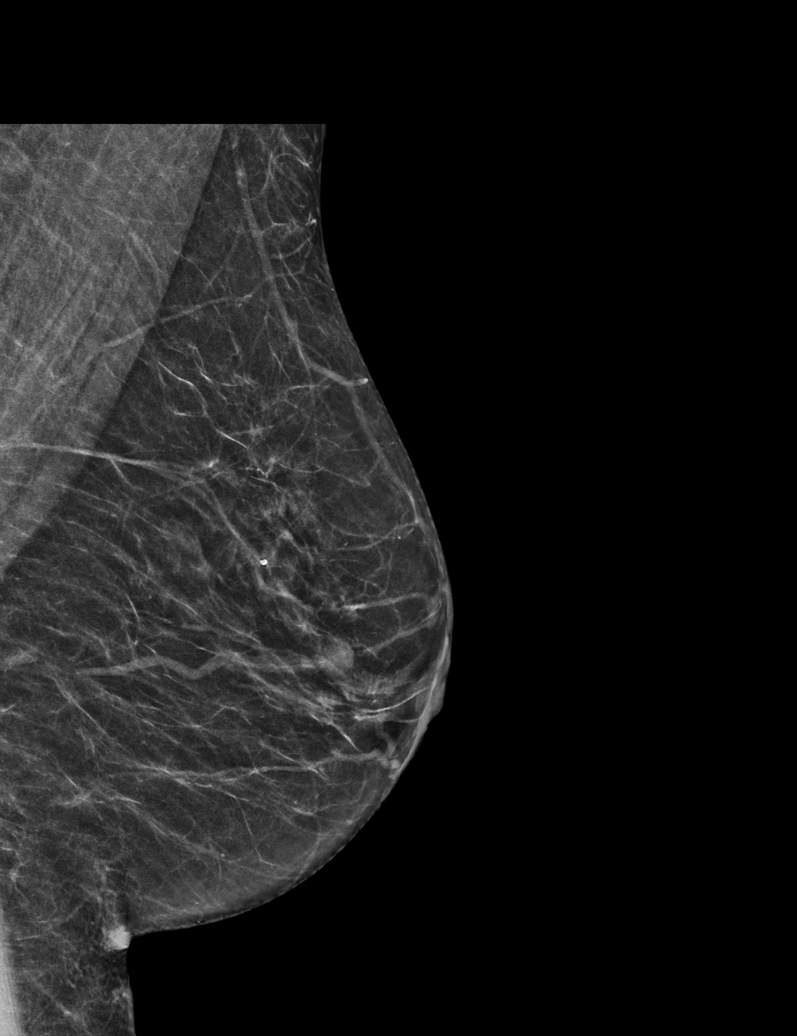

[R CC synth-2D]
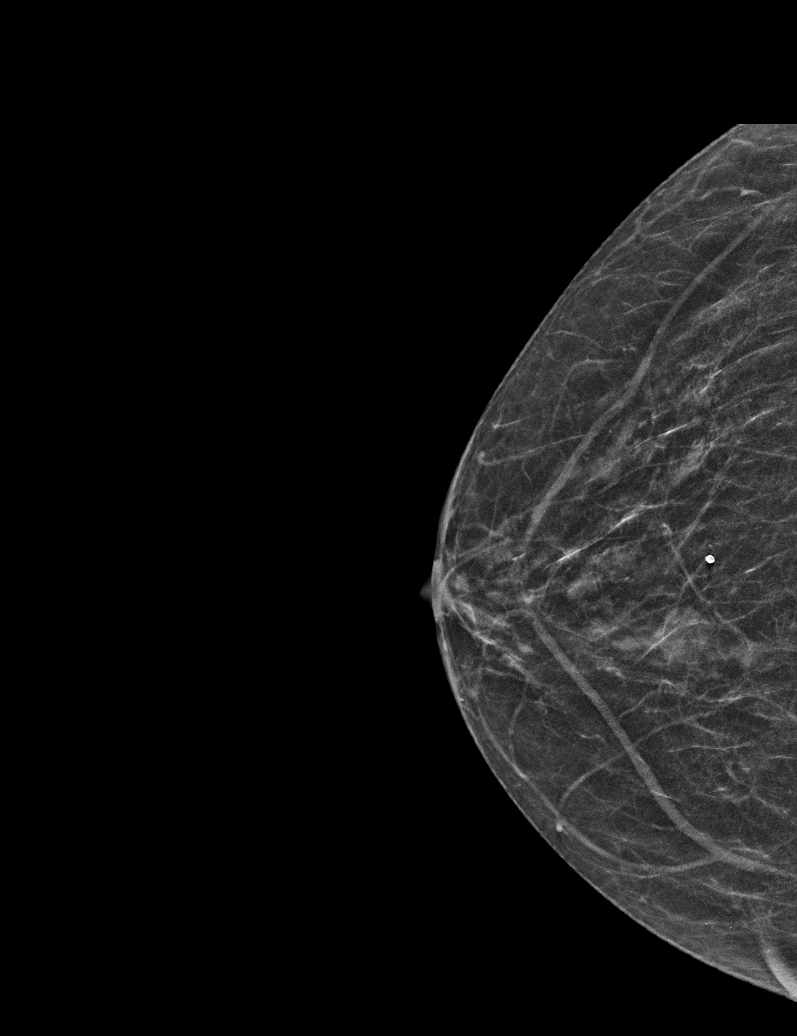

[L CC synth-2D]
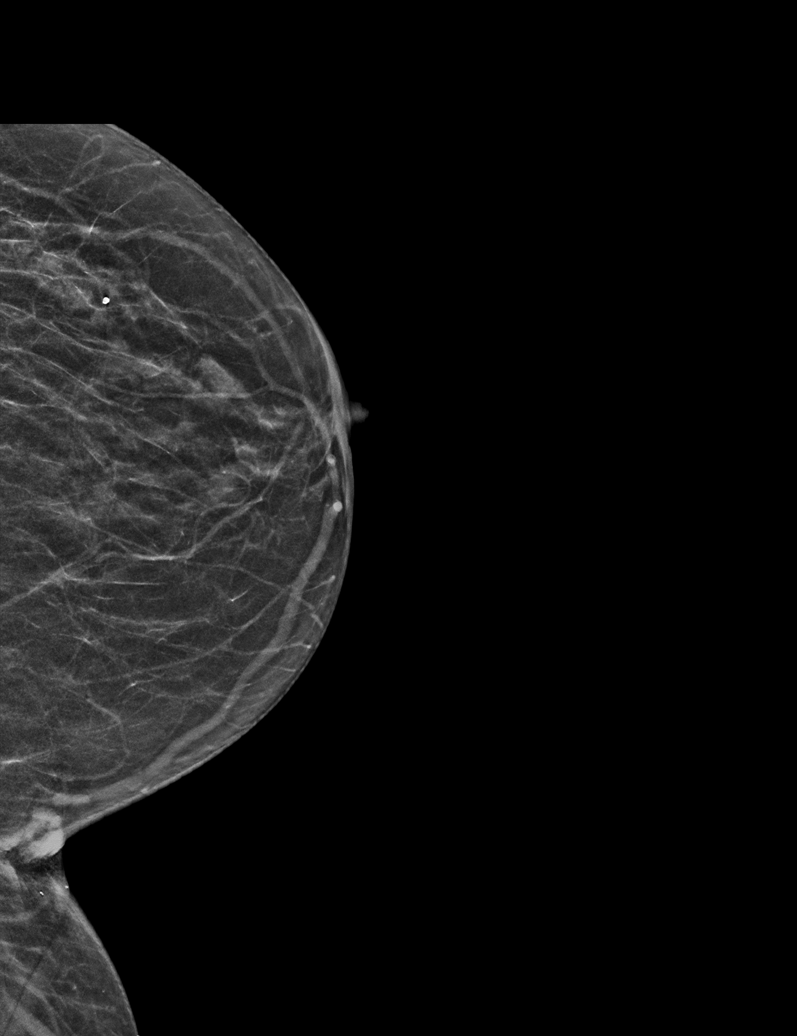

[R MLO synth-2D]
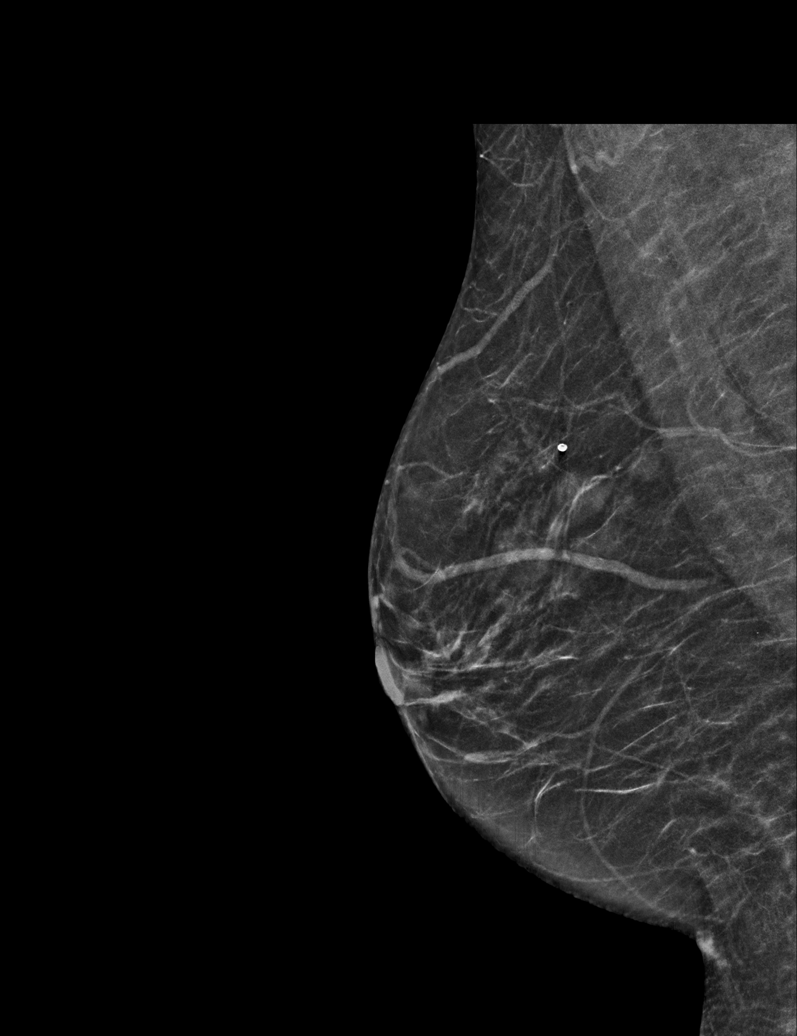

[L CC tomo · tomo slice 21/42.0]
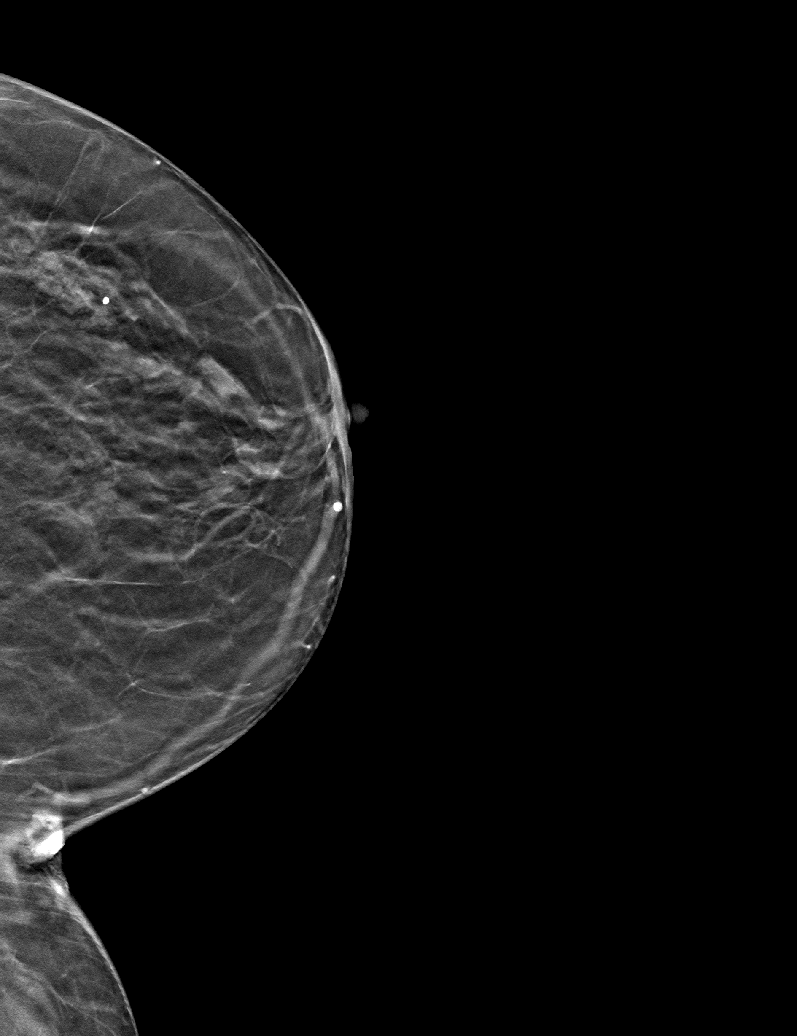

[6 of 30 positions shown; findings below may reference images not displayed]

ACR Breast Density Category b: There are scattered areas of
fibroglandular density.
FINDINGS: There are no findings suspicious for malignancy. Images were
processed with CAD.
IMPRESSION: No mammographic evidence of malignancy. A result letter of this
screening mammogram will be mailed directly to the patient.

RECOMMENDATION:
Screening mammogram in one year. (Code:[TQ])

BI-RADS CATEGORY  1: Negative.

## 2018-06-01 ENCOUNTER — Encounter: Payer: Self-pay | Admitting: Pulmonary Disease

## 2018-06-01 ENCOUNTER — Ambulatory Visit (INDEPENDENT_AMBULATORY_CARE_PROVIDER_SITE_OTHER): Payer: BLUE CROSS/BLUE SHIELD | Admitting: Pulmonary Disease

## 2018-06-01 VITALS — BP 118/78 | HR 71 | Ht 67.75 in | Wt 148.2 lb

## 2018-06-01 DIAGNOSIS — J4520 Mild intermittent asthma, uncomplicated: Secondary | ICD-10-CM

## 2018-06-01 MED ORDER — ALBUTEROL SULFATE 108 (90 BASE) MCG/ACT IN AEPB: 2.0000 | INHALATION_SPRAY | RESPIRATORY_TRACT | 12 refills | Status: DC | PRN

## 2018-06-01 NOTE — Progress Notes (Signed)
PULMONARY OFFICE FOLLOW UP NOTE  Requesting MD/Service: Einar Pheasant, MD Date of initial consultation: 04/09/16 Reason for consultation: Dyspnea  PT PROFILE: 83 F never smoker referred for evaluation of episodic mild dyspnea X 8-9 months. Suspected asthma. Initial Plan: trial of ICS/LABA.   DATA: CT chest 11/29/13: minimal bi-apical scarring CXR 12/16/15: NACPD PFTs 05/16/16: normal spirometry, normal volumes, normal DLCO 03/18/17 Stress echocardiogram:  Normal  INTERVAL: Last seen 04/2017.  At that time, I recommend that she discontinue Breo inhaler  SUBJ:  In the past year, she has had no major problems.  She has been off of the Breo inhaler without significant change in her overall respiratory status.  She often goes long stretches (months) without requiring her albuterol rescue inhaler.  However, during certain seasons (such as Autumn) she does feel that she occasionally requires an albuterol inhaler for shortness of breath no chest tightness.  Her symptoms are well relieved when she uses it.  At most, she will use albuterol once a day and never more than a few times per week at the worst time of year.  Vitals:   06/01/18 1441 06/01/18 1447  BP:  118/78  Pulse:  71  SpO2:  96%  Weight: 148 lb 3.2 oz (67.2 kg)   Height: 5' 7.75" (1.721 m)   Room air  EXAM:  Gen: NAD HEENT: NCAT, sclera white Neck: No JVD Lungs: breath sounds full, no wheezes or other adventitious sounds Cardiovascular: RRR, no murmurs Abdomen: Soft, nontender, normal BS Ext: without clubbing, cyanosis, edema Neuro: grossly intact Skin: Limited exam, no lesions noted   DATA:  CXR 10/24/17: normal  IMPRESSION:      ICD-10-CM   1. Mild intermittent asthma without complication H47.65     PLAN:  Continue albuterol inhaler as needed for increased shortness of breath, wheezing, chest tightness, cough.  I have refilled her prescription for this medication  Follow-up as needed only for any breathing,  chest problem   Merton Border, MD PCCM service Mobile [phone] Pager [phone] 06/01/2018 3:11 PM

## 2018-06-01 NOTE — Patient Instructions (Signed)
Continue albuterol inhaler as needed for increased shortness of breath, chest tightness, wheezing, cough.  Prescription has been refilled.    Follow-up as needed for any breathing or chest problems

## 2018-06-04 ENCOUNTER — Ambulatory Visit: Payer: BLUE CROSS/BLUE SHIELD | Admitting: Dietician

## 2018-06-05 ENCOUNTER — Other Ambulatory Visit: Payer: Self-pay | Admitting: Internal Medicine

## 2018-06-05 ENCOUNTER — Ambulatory Visit: Payer: BLUE CROSS/BLUE SHIELD | Attending: Internal Medicine | Admitting: Physical Therapy

## 2018-06-05 DIAGNOSIS — M791 Myalgia, unspecified site: Secondary | ICD-10-CM | POA: Insufficient documentation

## 2018-06-05 DIAGNOSIS — M4125 Other idiopathic scoliosis, thoracolumbar region: Secondary | ICD-10-CM | POA: Diagnosis present

## 2018-06-05 DIAGNOSIS — M533 Sacrococcygeal disorders, not elsewhere classified: Secondary | ICD-10-CM

## 2018-06-05 DIAGNOSIS — R351 Nocturia: Secondary | ICD-10-CM

## 2018-06-05 DIAGNOSIS — R293 Abnormal posture: Secondary | ICD-10-CM | POA: Diagnosis present

## 2018-06-05 DIAGNOSIS — M6281 Muscle weakness (generalized): Secondary | ICD-10-CM

## 2018-06-05 DIAGNOSIS — M25551 Pain in right hip: Secondary | ICD-10-CM

## 2018-06-05 NOTE — Progress Notes (Signed)
Order placed for urology referral.  

## 2018-06-05 NOTE — Patient Instructions (Addendum)
Even up shoe  https://www.[facility].com/store/c/evenup-shoe-[email or web address]-[email or web address]=[email or web address] ____   Follow up with nutritionist    _____   For neck:  Washing back stretch in seated  R hand pulling towel up, while the L hand pulls down, elbow bent behind upper back, shoulder socket roll back   Head tucked  5 sec holds  For stretch x 5    _  Low cobra:  On belly, palms under armpits, elbows pointed to ceiling  Inhale: lengthen crown of the head away from shoulders Exhale, feel belly hug in and press palms into the floor, squeezing elbows/shoulder blades towards each other while chest lifts about 5 cm off of the floor. You should feel the hinging movement at mid back and not the low back.

## 2018-06-05 NOTE — Therapy (Signed)
[place] MAIN [facility] SERVICES [address] [place], [place], [address] Phone: [phone]   Fax:  [phone]  Physical Therapy Treatment  Patient Details  Name: Lynn Mann MRN: [id number] Date of Birth: Dec 27, 1950 Referring Provider (PT): Einar Pheasant, MD    Encounter Date: 06/05/2018  PT End of Session - 06/05/18 1605    Visit Number  17    Date for PT Re-Evaluation  07/28/18    PT Start Time  1020    PT Stop Time  1100    PT Time Calculation (min)  40 min    Activity Tolerance  Patient tolerated treatment well    Behavior During Therapy  [facility] for tasks assessed/performed       Past Medical History:  Diagnosis Date  . Arthritis   . Chicken pox   . Cholecystitis 11/2011   Did not require sgy - Dr. Staci Acosta - [facility]  (cholelithiasis)  . H/O Clostridium difficile infection   . IBS (irritable bowel syndrome)   . MRSA exposure 2005   Spider bite  . MVP (mitral valve prolapse)    Stable - Dr. Ubaldo Glassing  . Rheumatic fever     Past Surgical History:  Procedure Laterality Date  . CATARACT EXTRACTION  [id number]  . MUSCLE BIOPSY      There were no vitals filed for this visit.  Subjective Assessment - 06/05/18 1024    Subjective  Pt has had constipation lately. Pt broke 2 right toes but had no fall. Pt missed her nutritionist appt. Her neck is not doing well. Turning her head causes a shooting up pain from the nape of skull L > R.     Pertinent History  Pt had a colonscopy which showed internal hemorrhoids, polyp that were benign and removed. Pt had lost 40 lb between May 2018 to current. Pt has felt she lost muscle mass through her weight loss which she did not following any diets. Pt is now trying has enough protein for build muscles but she has not worked with a nutritionist.            [facility] PT Assessment - 06/05/18 1043      AROM   Overall AROM Comments  sidebend cervical 25 deg L, 30 deg R,  flexion/ext 50 deg,  rotation R 50  deg , L 30 deg    post Tx: sidebend 45 deg B, rotation no change     Palpation   Spinal mobility  no scap dyskinesis present, B downward rotation of B scapula, interspinal mm tightness along upper thoracic, rhomboid minor L                     [facility] Adult PT Treatment/Exercise - 06/05/18 1100      Neuro Re-ed    Neuro Re-ed Details   see pt instructions      Manual Therapy   Manual therapy comments  prone: Grade III at upper thoracic, STM at problem area noted assessment                   PT Long Term Goals - 04/15/18 1206      PT LONG TERM GOAL #1   Title  Pt will decrease her PFDI score from 47% to < 42% in order to improve pelvic floor function ( 6/4/: 36%)     Time  8    Period  Weeks    Status  Achieved  PT LONG TERM GOAL #2   Title  Pt will report compliance with proper toileting technique and not squatting/ hovering over the toilet in public restrooms in order to promote pelvic health    Time  2    Period  Weeks    Status  Achieved      PT LONG TERM GOAL #3   Title  Pt will demo IND with pelvic floor exercises     Time  12    Period  Weeks    Status  On-going      PT LONG TERM GOAL #4   Title  Pt will be referred to community fitness classes a senior center for overall strengthening     Time  4    Period  Weeks    Status  Achieved      PT LONG TERM GOAL #5   Title  Pt will improve her bowel movements from every 3 days to daily and Stool Type  Bristol Scale from  Type 4 ( 50%) and Type 7 ( 50%) to Type 4 ( 75% of the time) in order to improve GI/ pelvic function   ( 6/4: every other day, Type 4 ( 75%) , 7/16: with colace)      Time  8    Period  Weeks    Status  Achieved      Additional Long Term Goals   Additional Long Term Goals  Yes      PT LONG TERM GOAL #6   Title  Pt will demo less pronation in B with gait and mini squats in order to miminize risk for knee and pelvic floor dysfunctions    Time  8    Period  Weeks    Status   Achieved      PT LONG TERM GOAL #7   Title  Pt will be compliant with scoliosis HEP to minimize L midback ( shoulder blade area)  while sitting     Time  12    Period  Weeks    Status  On-going      PT LONG TERM GOAL #8   Title  Pt will report decreased leakage when gettign to the bathroom  1-3x / night across 2 x week to less than 2x/ night across < 2 x night in order to improve sleep and  pelvic function    Time  8    Period  Weeks    Status  New            Plan - 06/05/18 1606    Clinical Impression Statement Pt showed no scapular dyskinesis today which indicates good carry over form past session. Pt 's cervical B side flexion improved post Tx which targeted the tightness/ hypomobility at L upper thoracic segments/ medial mm to scapula. Pt's rotation ROM w/ neck pain remained unchanged. Advanced pt to cervical extensor/ scapula depression/retraction strengthening. Plan to continue addressing cervical issues. Discussed w/ pt 's need for a referral to see a urologist due to her unchanged status with increased urine volume at night. Her urinary leakage and  leg swelling has improved with prescribed exercises prioer to bed. Pt's PCP was contacted stating this recommendation. Pt continues to benefit from skilled PT.     PT Frequency  1x / week    PT Duration  12 weeks    PT Treatment/Interventions  Neuromuscular re-education;Balance training;Therapeutic exercise;Orthotic Fit/Training;Functional mobility training;Moist Heat;Traction;Therapeutic activities;Patient/family education;Manual techniques    Consulted and Agree with Plan  of Care  Patient       Patient will benefit from skilled therapeutic intervention in order to improve the following deficits and impairments:  Postural dysfunction, Increased muscle spasms, Decreased scar mobility, Decreased coordination, Decreased mobility, Decreased range of motion, Decreased endurance, Decreased activity tolerance, Difficulty walking,  Decreased safety awareness, Decreased balance, Abnormal gait, Decreased strength, Hypomobility, Improper body mechanics  Visit Diagnosis: Myalgia  Abnormal posture  Other idiopathic scoliosis, thoracolumbar region  Pain in right hip  Muscle weakness (generalized)  Sacrococcygeal disorders, not elsewhere classified     Problem List Patient Active Problem List   Diagnosis Date Noted  . Itching 07/21/2017  . Asthma 07/16/2016  . Urinary incontinence 07/16/2016  . SOB (shortness of breath) 04/01/2016  . Bilateral shoulder pain 02/24/2016  . Neck fullness 01/28/2016  . Routine general medical examination at a health care facility 07/25/2015  . Health care maintenance 10/09/2014  . Neck pain 11/26/2013  . Hypercholesterolemia 11/26/2013  . History of colonic polyps 05/11/2013  . Hyperbilirubinemia 01/18/2013  . GERD (gastroesophageal reflux disease) 12/03/2012  . Cholelithiasis 11/07/2012  . IBS (irritable bowel syndrome) 11/07/2012  . History of rheumatic fever 08/28/2012  . MVP (mitral valve prolapse) 08/28/2012    Jerl Mina ,PT, DPT, E-RYT  06/05/2018, 4:11 PM  [place] MAIN [facility] SERVICES [address] [place], [place], [address] Phone: [phone]   Fax:  [phone]  Name: Lynn Mann MRN: [id number] Date of Birth: 11-21-1950

## 2018-06-05 NOTE — Addendum Note (Signed)
Addended by: Jerl Mina on: 06/05/2018 10:37 AM   Modules accepted: Orders

## 2018-06-08 ENCOUNTER — Other Ambulatory Visit: Payer: Self-pay | Admitting: Internal Medicine

## 2018-06-09 ENCOUNTER — Other Ambulatory Visit: Payer: Self-pay | Admitting: Student

## 2018-06-09 DIAGNOSIS — R194 Change in bowel habit: Secondary | ICD-10-CM

## 2018-06-09 DIAGNOSIS — R195 Other fecal abnormalities: Secondary | ICD-10-CM

## 2018-06-09 DIAGNOSIS — K5900 Constipation, unspecified: Secondary | ICD-10-CM

## 2018-06-17 ENCOUNTER — Ambulatory Visit
Admission: RE | Admit: 2018-06-17 | Discharge: 2018-06-17 | Disposition: A | Discharge status: Home / Self Care | Payer: BLUE CROSS/BLUE SHIELD | Source: Ambulatory Visit | Attending: Student | Admitting: Student

## 2018-06-17 ENCOUNTER — Encounter: Payer: Self-pay | Admitting: Dietician

## 2018-06-17 ENCOUNTER — Other Ambulatory Visit
Admission: RE | Admit: 2018-06-17 | Discharge: 2018-06-17 | Disposition: A | Discharge status: Home / Self Care | Payer: BLUE CROSS/BLUE SHIELD | Source: Ambulatory Visit | Attending: Student | Admitting: Student

## 2018-06-17 ENCOUNTER — Encounter: Payer: BLUE CROSS/BLUE SHIELD | Attending: Internal Medicine | Admitting: Dietician

## 2018-06-17 VITALS — Ht 67.75 in | Wt 151.4 lb

## 2018-06-17 DIAGNOSIS — E7800 Pure hypercholesterolemia, unspecified: Secondary | ICD-10-CM | POA: Diagnosis not present

## 2018-06-17 DIAGNOSIS — K5900 Constipation, unspecified: Secondary | ICD-10-CM | POA: Insufficient documentation

## 2018-06-17 DIAGNOSIS — R195 Other fecal abnormalities: Secondary | ICD-10-CM | POA: Insufficient documentation

## 2018-06-17 DIAGNOSIS — M47816 Spondylosis without myelopathy or radiculopathy, lumbar region: Secondary | ICD-10-CM | POA: Diagnosis not present

## 2018-06-17 DIAGNOSIS — I868 Varicose veins of other specified sites: Secondary | ICD-10-CM | POA: Insufficient documentation

## 2018-06-17 DIAGNOSIS — R194 Change in bowel habit: Secondary | ICD-10-CM | POA: Insufficient documentation

## 2018-06-17 LAB — CREATININE, SERUM
Creatinine, Ser: 0.6 mg/dL (ref 0.44–1.00)
GFR calc Af Amer: >60 mL/min (ref >60)
GFR calc non Af Amer: >60 mL/min (ref >60)

## 2018-06-17 IMAGING — CT CT ABD-PELV W/ CM
1 of 3 series · 14 of 32 positions shown, 19 images · IV contrast (APPLIED)
Comparison: [DATE]

CLINICAL DATA: Pt c/o constipation since before [DATE]. Stool
impaction [DATE]. Hx of IBS with diarrhea for years. NKI. Hx of
gallstones. No hx of cancer.

EXAM:
CT ABDOMEN AND PELVIS WITH CONTRAST
TECHNIQUE: Multidetector CT imaging of the abdomen and pelvis was performed
using the standard protocol following bolus administration of
intravenous contrast.
CONTRAST:  100mL [RL] IOPAMIDOL ([RL]) INJECTION 61%

[Series 2: axial st · axial · 0.76mm/px · z∈[-1202,-788]mm · 14 of 95 slices shown, 19 images]
[im 6/95  soft-tissue]
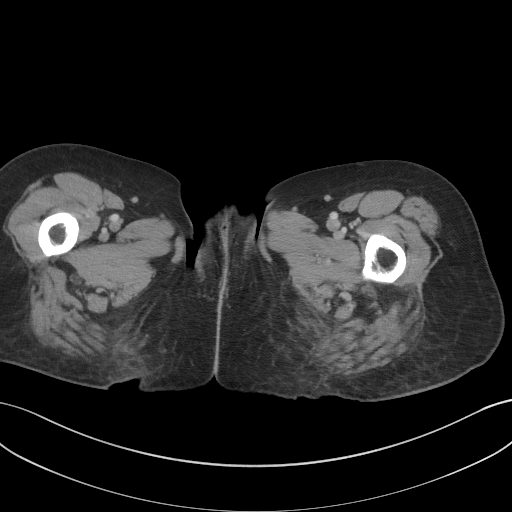
[im 6/95  bone]
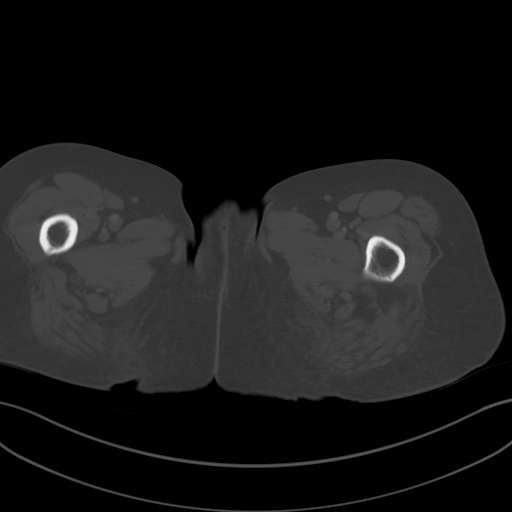
[im 11/95  soft-tissue]
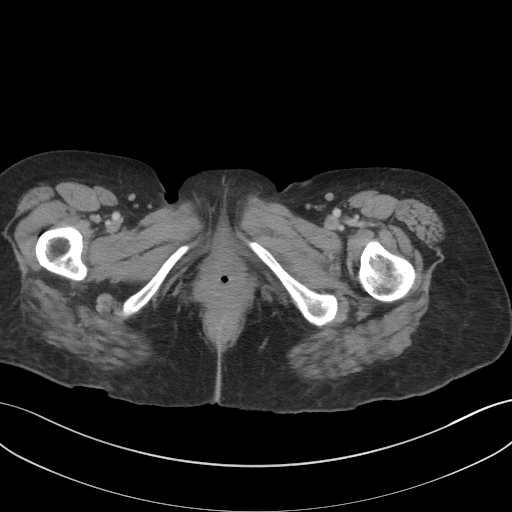
[im 21/95  soft-tissue]
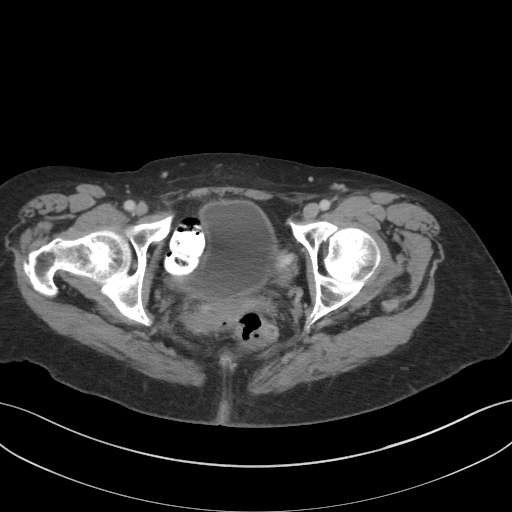
[im 27/95  soft-tissue]
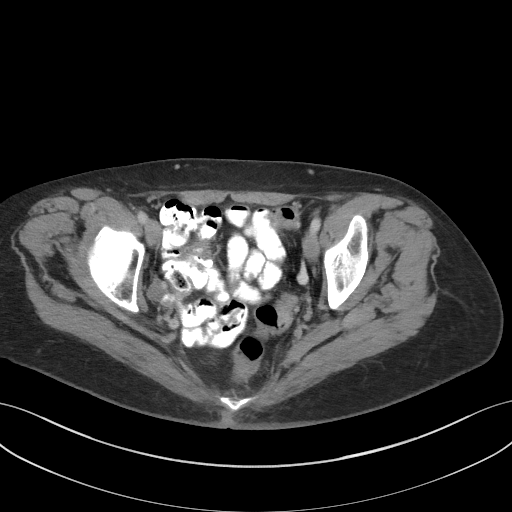
[im 32/95  soft-tissue]
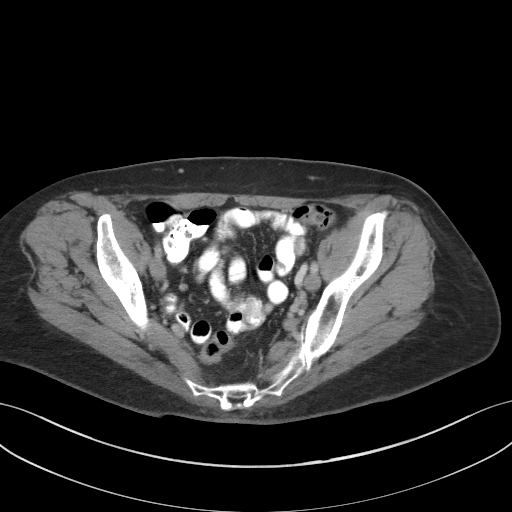
[im 42/95  soft-tissue]
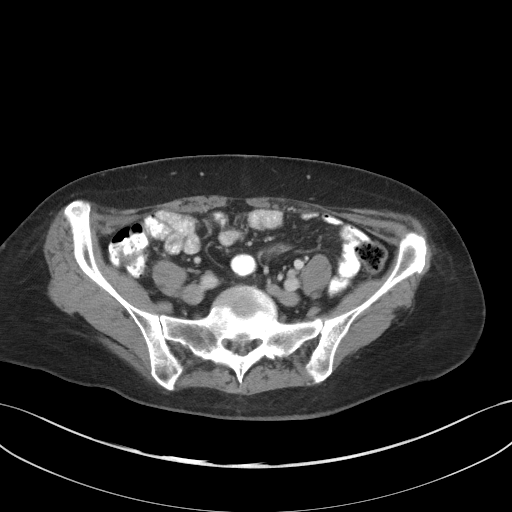
[im 48/95  soft-tissue]
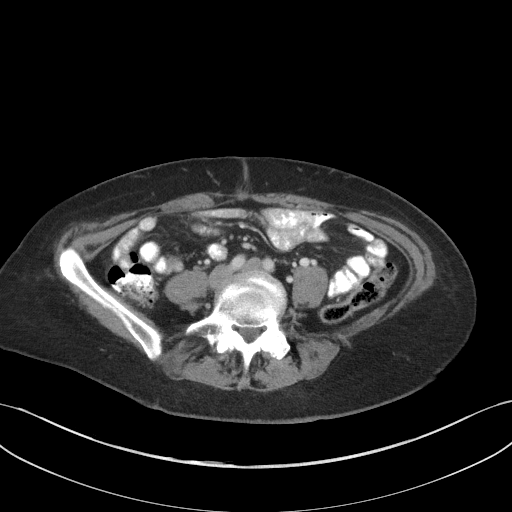
[im 53/95  soft-tissue]
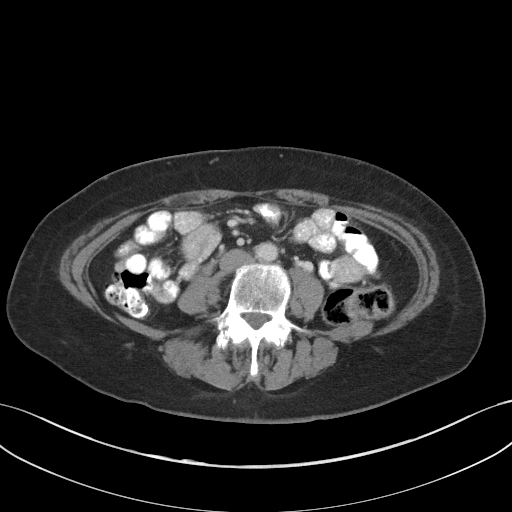
[im 63/95  soft-tissue]
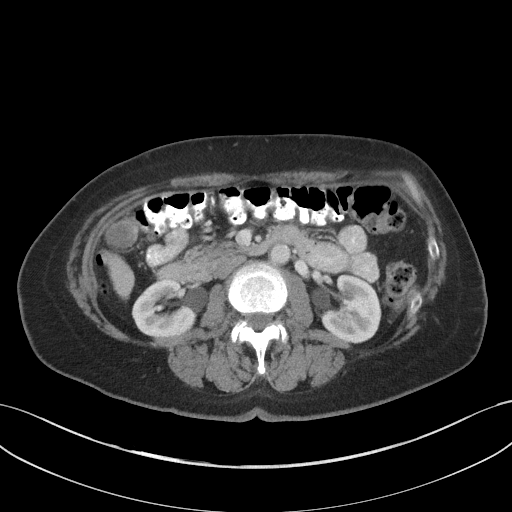
[im 63/95  bone]
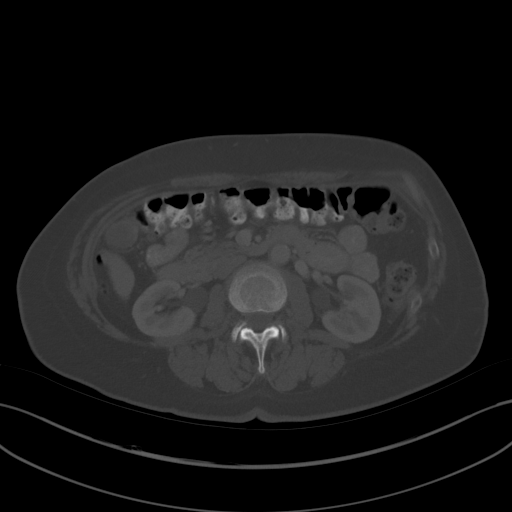
[im 68/95  soft-tissue]
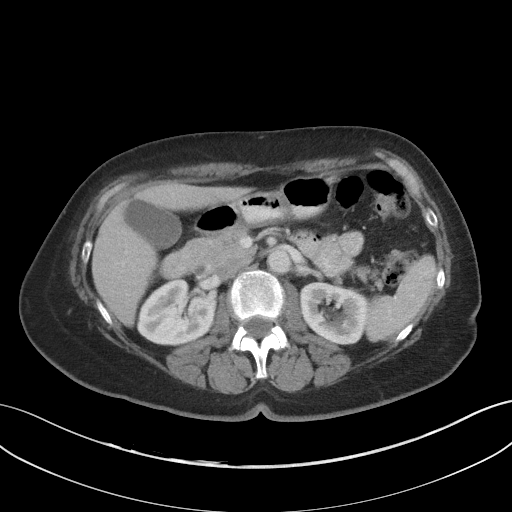
[im 74/95  soft-tissue]
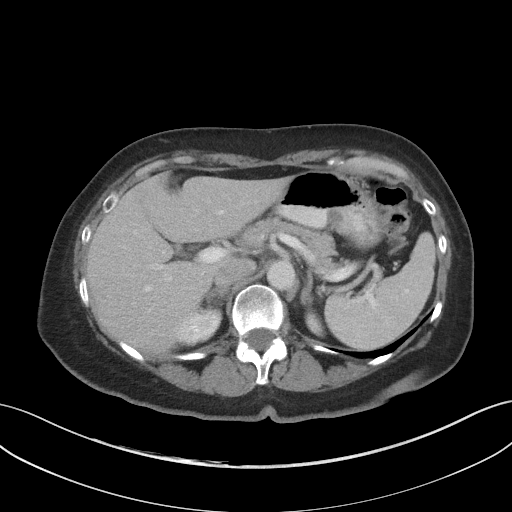
[im 74/95  lung]
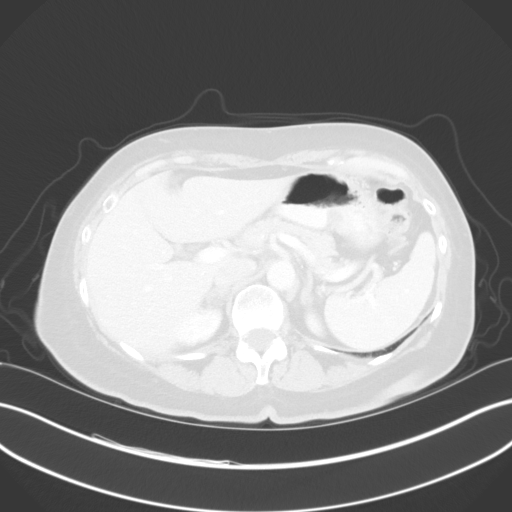
[im 79/95  lung]
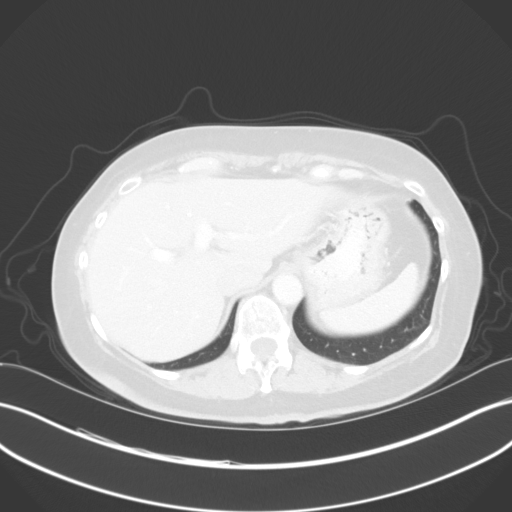
[im 84/95  soft-tissue]
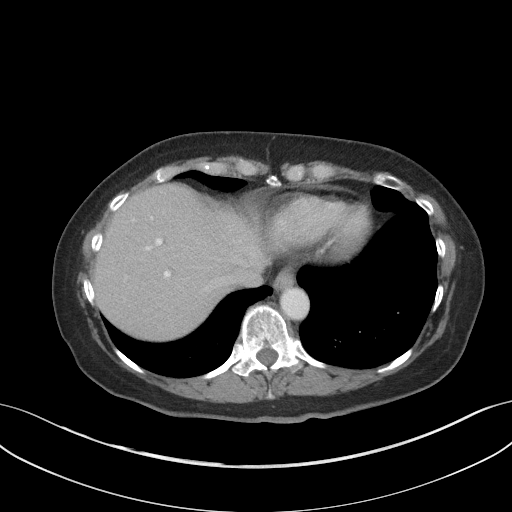
[im 84/95  lung]
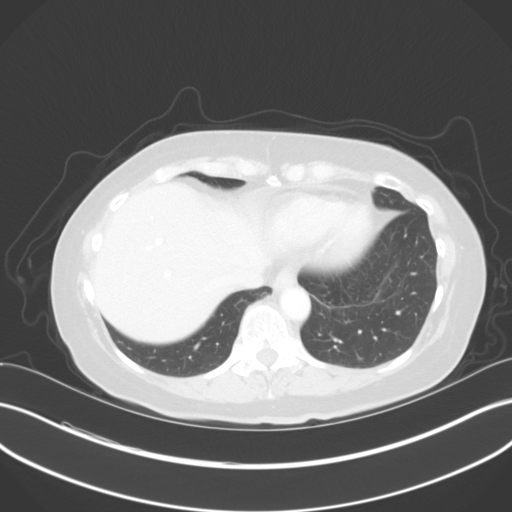
[im 89/95  soft-tissue]
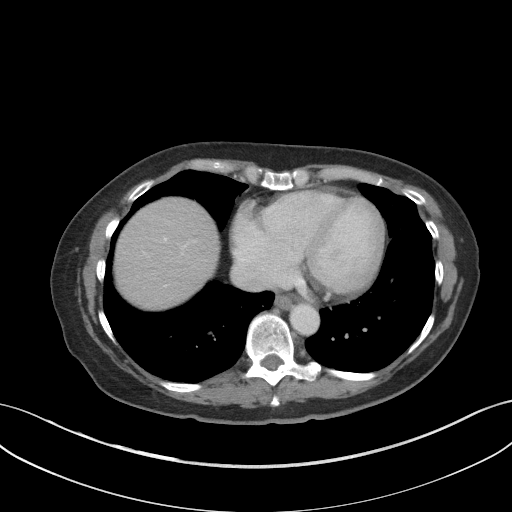
[im 89/95  lung]
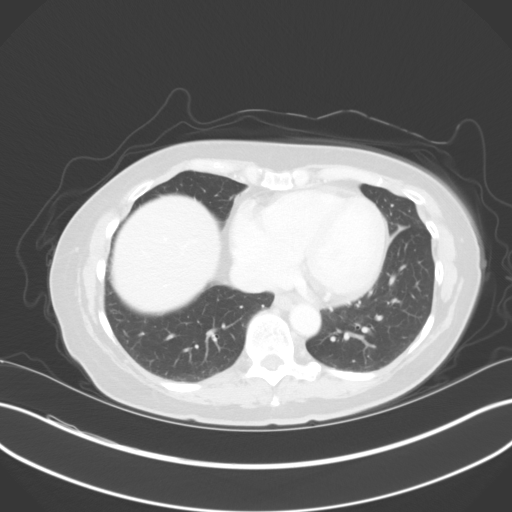

[14 of 32 positions shown; findings below may reference images not displayed]

FINDINGS: Lower chest: No acute abnormality.

Hepatobiliary: No focal liver abnormality is seen. No gallstones,
gallbladder wall thickening, or biliary dilatation.

Pancreas: Unremarkable. No pancreatic ductal dilatation or
surrounding inflammatory changes.

Spleen: Normal in size without focal abnormality.

Adrenals/Urinary Tract: Normal adrenals. No renal mass or
hydronephrosis. Left lower pole parapelvic cysts. Urinary bladder
incompletely distended.

Stomach/Bowel: Stomach is within normal limits. Appendix appears
normal. No evidence of bowel wall thickening, distention, or
inflammatory changes.

Vascular/Lymphatic: Refluxing dilated left ovarian vein, supplying
dilated adnexal veins left greater than right. Scattered infrarenal
aortic calcified atheromatous plaque without aneurysm or stenosis.
No abdominal or pelvic adenopathy.

Reproductive: Dilated adnexal veins left greater than right supplied
by refluxing left gonadal vein. No mass.

Other: No ascites.  No free air.

Musculoskeletal: Early grade 1 anterolisthesis L4-5 probably related
to asymmetric facet DJD at this level. No pars defect. No fracture
or other worrisome bone lesion.
IMPRESSION: 1. No acute findings.
2. Dilated refluxing left gonadal vein supplying prominent adnexal
veins. Correlate with any clinical evidence of pelvic congestion
syndrome.
3. Lower lumbar spondylitic change as above.

## 2018-06-17 MED ORDER — IOPAMIDOL (ISOVUE-300) INJECTION 61%
100.0000 mL | Freq: Once | INTRAVENOUS | Status: AC | PRN
  Administered 2018-06-17: 100 mL via INTRAVENOUS

## 2018-06-17 NOTE — Progress Notes (Signed)
Medical Nutrition Therapy: Visit start time: 1500  end time: 1530  Assessment:  Diagnosis: hyperlipidemia Medical history changes: no changes Psychosocial issues/ stress concerns: none  Current weight: 151.4lbs Height: 5'7.75" Medications, supplement changes: reconciled list in medical record  Progress and evaluation: Patient reports weight loss of about 5lbs since previous visit, but has regained weight since toe fracture and more limited activity + recent increase in "comfort foods". Has increased intake of beans and some apple or cranberry juice for constipation; otherwise has continued with her other healthy eating habits.    Physical activity: limited walking due to recent toe fracture, 2 weeks ago  Dietary Intake:  Usual eating pattern includes 2-3 meals and 1-2 snacks per day. Dining out frequency: 10-14 meals per week.  Breakfast: protein drink +/or nuts, walnuts, yogurt; sometimes forgets to eat when busy Snack: none Lunch: sometimes out; none if late breakfast Snack: yogurt or nuts especially when no lunch Supper: usually out lean protein meat or beans + vegetables, limited bread/ starch Snack: none Beverages: water, sometimes coffee, occasionally 2oz soda  Nutrition Care Education: Topics covered:  Weight control: reviewed possible food and non-food causes for weight gain including constipation, fluid retention, weight of clothing, decrease in activity; reviewed benefits of Mediterranean and DASH diets. Other: discussed patient questions regarding benefits of collagen supplementation for skin, via tablet form, or bone broth  Nutritional Diagnosis:  -2.2 Altered nutrition-related laboratory As related to hyperlipidemia.  As evidenced by patient wtih elevated LDL cholesterol, and history of elevated triglycerides.  Intervention:   Discussion as noted above.  Patient is consuming heart healthy food choices and avoiding foods high in saturated fat.   Patient remains  active and plans to resume additional walking/ exercise when recovered from toe fraccture.   No additional follow-up needed at this time; patient will call with any questions or concerns.   Education Materials given:  . Recipes . Goals/ instructions  Learner/ who was taught:  . Patient    Level of understanding: Marland Kitchen Verbalizes/ demonstrates competency   Demonstrated degree of understanding via:   Teach back Learning barriers: . None  Willingness to learn/ readiness for change: . Eager, change in progress   Monitoring and Evaluation:  Dietary intake, exercise, blood lipids, and body weight      follow up: prn

## 2018-06-18 ENCOUNTER — Ambulatory Visit: Payer: BLUE CROSS/BLUE SHIELD | Admitting: Physical Therapy

## 2018-06-18 DIAGNOSIS — M25551 Pain in right hip: Secondary | ICD-10-CM

## 2018-06-18 DIAGNOSIS — M4125 Other idiopathic scoliosis, thoracolumbar region: Secondary | ICD-10-CM

## 2018-06-18 DIAGNOSIS — M533 Sacrococcygeal disorders, not elsewhere classified: Secondary | ICD-10-CM

## 2018-06-18 DIAGNOSIS — R293 Abnormal posture: Secondary | ICD-10-CM

## 2018-06-18 DIAGNOSIS — M6281 Muscle weakness (generalized): Secondary | ICD-10-CM

## 2018-06-18 DIAGNOSIS — M791 Myalgia, unspecified site: Secondary | ICD-10-CM

## 2018-06-18 NOTE — Patient Instructions (Signed)
Pedal exerciser ( use thick blankets to make higher seat, press forearms down and shoulder blades squeeze down and back)   10 min   x 3   _____   Wall squat with back of PSIS pressing to wall Put more weight on outsides of feet to track knee out  shoulder blades squeeze down and back, chin tuck  Knees above ankle  20 reps x 3 day   Exhale to stand , with knees unlocked  * do this exercise after reading 40 min to correct neck posture   _____  Wall push ups ,  shoulder blades squeeze down and back, chin tuck  20 reps x 3 x day

## 2018-06-19 NOTE — Therapy (Signed)
[place] MAIN [facility] SERVICES [address] [place], [place], [address] Phone: [phone]   Fax:  [phone]  Physical Therapy Treatment  Patient Details  Name: Lynn Mann MRN: [id number] Date of Birth: 1951-05-05 Referring Provider (PT): Einar Pheasant, MD    Encounter Date: 06/18/2018  PT End of Session - 06/18/18 1316    Visit Number  18    Date for PT Re-Evaluation  07/28/18    PT Start Time  1316    PT Stop Time  1400    PT Time Calculation (min)  44 min    Activity Tolerance  Patient tolerated treatment well    Behavior During Therapy  [facility] for tasks assessed/performed       Past Medical History:  Diagnosis Date  . Arthritis   . Chicken pox   . Cholecystitis 11/2011   Did not require sgy - Dr. Staci Acosta - [facility]  (cholelithiasis)  . H/O Clostridium difficile infection   . IBS (irritable bowel syndrome)   . MRSA exposure 2005   Spider bite  . MVP (mitral valve prolapse)    Stable - Dr. Ubaldo Glassing  . Rheumatic fever     Past Surgical History:  Procedure Laterality Date  . CATARACT EXTRACTION  [id number]  . MUSCLE BIOPSY      There were no vitals filed for this visit.  Subjective Assessment - 06/18/18 1317    Subjective  Pt has seen her doctors.Her gynecologist checked PAP smear, and ordered Bone Density Scan. Her PCP ( Dr. Wynetta Emery) has ordered an abdominal scan. Pt has appt scheduled with her urologist. Pt had a bad time yesterday and today with urine leakage because she had increased liquids. Pt had an xray done to check on her fractured toes.     Pertinent History  Pt had a colonscopy which showed internal hemorrhoids, polyp that were benign and removed. Pt had lost 40 lb between May 2018 to current. Pt has felt she lost muscle mass through her weight loss which she did not following any diets. Pt is now trying has enough protein for build muscles but she has not worked with a nutritionist.            [facility] PT Assessment -  06/18/18 1350      Observation/Other Assessments   Observations  less forward, more scapular control with less cues                    [facility] Adult PT Treatment/Exercise - 06/18/18 1349      Therapeutic Activites    Other Therapeutic Activities  see pt instructions       Neuro Re-ed    Neuro Re-ed Details   see pt instructions                  PT Long Term Goals - 04/15/18 1206      PT LONG TERM GOAL #1   Title  Pt will decrease her PFDI score from 47% to < 42% in order to improve pelvic floor function ( 6/4/: 36%)     Time  8    Period  Weeks    Status  Achieved      PT LONG TERM GOAL #2   Title  Pt will report compliance with proper toileting technique and not squatting/ hovering over the toilet in public restrooms in order to promote pelvic health    Time  2    Period  Weeks  Status  Achieved      PT LONG TERM GOAL #3   Title  Pt will demo IND with pelvic floor exercises     Time  12    Period  Weeks    Status  On-going      PT LONG TERM GOAL #4   Title  Pt will be referred to community fitness classes a senior center for overall strengthening     Time  4    Period  Weeks    Status  Achieved      PT LONG TERM GOAL #5   Title  Pt will improve her bowel movements from every 3 days to daily and Stool Type  Bristol Scale from  Type 4 ( 50%) and Type 7 ( 50%) to Type 4 ( 75% of the time) in order to improve GI/ pelvic function   ( 6/4: every other day, Type 4 ( 75%) , 7/16: with colace)      Time  8    Period  Weeks    Status  Achieved      Additional Long Term Goals   Additional Long Term Goals  Yes      PT LONG TERM GOAL #6   Title  Pt will demo less pronation in B with gait and mini squats in order to miminize risk for knee and pelvic floor dysfunctions    Time  8    Period  Weeks    Status  Achieved      PT LONG TERM GOAL #7   Title  Pt will be compliant with scoliosis HEP to minimize L midback ( shoulder blade area)  while sitting      Time  12    Period  Weeks    Status  On-going      PT LONG TERM GOAL #8   Title  Pt will report decreased leakage when gettign to the bathroom  1-3x / night across 2 x week to less than 2x/ night across < 2 x night in order to improve sleep and  pelvic function    Time  8    Period  Weeks    Status  New            Plan - 06/18/18 1349    Clinical Impression Statement  Pt demonstrates less forward head posture and improved scapular depression/retraction/cervical retraction with less cues. Initiated upright CKC strengthening and modified her cardio routine from walking to recommendation to sue pedal exercise bike under chair 2/2 to toe healing from her Fx. Provided technique on set up to promote thoracolumbar/scapular stabilization while biking. Pt demo'd simulation correctly. Added exercise to compliment pt's hobby with reading to minimize forward head posture. Pt continues to benefit from skilled PT.     PT Frequency  1x / week    PT Duration  12 weeks    PT Treatment/Interventions  Neuromuscular re-education;Balance training;Therapeutic exercise;Orthotic Fit/Training;Functional mobility training;Moist Heat;Traction;Therapeutic activities;Patient/family education;Manual techniques    Consulted and Agree with Plan of Care  Patient       Patient will benefit from skilled therapeutic intervention in order to improve the following deficits and impairments:  Postural dysfunction, Increased muscle spasms, Decreased scar mobility, Decreased coordination, Decreased mobility, Decreased range of motion, Decreased endurance, Decreased activity tolerance, Difficulty walking, Decreased safety awareness, Decreased balance, Abnormal gait, Decreased strength, Hypomobility, Improper body mechanics  Visit Diagnosis: Abnormal posture  Myalgia  Other idiopathic scoliosis, thoracolumbar region  Pain in right hip  Muscle weakness (generalized)  Sacrococcygeal disorders, not elsewhere  classified     Problem List Patient Active Problem List   Diagnosis Date Noted  . Itching 07/21/2017  . Asthma 07/16/2016  . Urinary incontinence 07/16/2016  . SOB (shortness of breath) 04/01/2016  . Bilateral shoulder pain 02/24/2016  . Neck fullness 01/28/2016  . Routine general medical examination at a health care facility 07/25/2015  . Health care maintenance 10/09/2014  . Neck pain 11/26/2013  . Hypercholesterolemia 11/26/2013  . History of colonic polyps 05/11/2013  . Hyperbilirubinemia 01/18/2013  . GERD (gastroesophageal reflux disease) 12/03/2012  . Cholelithiasis 11/07/2012  . IBS (irritable bowel syndrome) 11/07/2012  . History of rheumatic fever 08/28/2012  . MVP (mitral valve prolapse) 08/28/2012    Jerl Mina ,PT, DPT, E-RYT  06/19/2018, 11:36 AM  [place] MAIN [facility] SERVICES [place], [place], [address] Phone: [phone]   Fax:  [phone]  Name: Lynn Mann MRN: [id number] Date of Birth: [birth year]

## 2018-06-23 ENCOUNTER — Telehealth: Payer: Self-pay

## 2018-06-23 NOTE — Telephone Encounter (Signed)
Copied from [place] [id number]. Topic: General - Other >> Jun 23, 2018 11:39 AM Carolyn Stare wrote:  Pt ask that Lorriane Shire give  her a call concerning her billing , said she wanted to make sure she paid her bills and did not want the number to billing

## 2018-07-02 ENCOUNTER — Ambulatory Visit: Payer: BLUE CROSS/BLUE SHIELD | Attending: Internal Medicine | Admitting: Physical Therapy

## 2018-07-02 DIAGNOSIS — M6281 Muscle weakness (generalized): Secondary | ICD-10-CM | POA: Diagnosis present

## 2018-07-02 DIAGNOSIS — M25551 Pain in right hip: Secondary | ICD-10-CM | POA: Diagnosis present

## 2018-07-02 DIAGNOSIS — M533 Sacrococcygeal disorders, not elsewhere classified: Secondary | ICD-10-CM | POA: Insufficient documentation

## 2018-07-02 DIAGNOSIS — R278 Other lack of coordination: Secondary | ICD-10-CM | POA: Insufficient documentation

## 2018-07-02 DIAGNOSIS — M4125 Other idiopathic scoliosis, thoracolumbar region: Secondary | ICD-10-CM | POA: Insufficient documentation

## 2018-07-02 DIAGNOSIS — R293 Abnormal posture: Secondary | ICD-10-CM | POA: Insufficient documentation

## 2018-07-02 DIAGNOSIS — M791 Myalgia, unspecified site: Secondary | ICD-10-CM | POA: Insufficient documentation

## 2018-07-02 NOTE — Patient Instructions (Addendum)
Discontinue last exercise ( head tilt )  Keep low cobra ( chin under) nose almost touching mattress  Keep wall push up with 4 corners of palm down , squeezing armpits, elbow in and down   __     On your back:   6 directions for the neck small motions no pillow, Shoulders press down and away from ears 3 rep each  ___   Lengthen skull with self-lift at occiput Pressing skull into palms

## 2018-07-03 NOTE — Therapy (Signed)
[place] MAIN [facility] SERVICES [address] [place], [place], [address] Phone: [phone]   Fax:  [phone]  Physical Therapy Treatment  Patient Details  Name: Lynn Mann MRN: [id number] Date of Birth: May 05, 1951 Referring Provider (PT): Einar Pheasant, MD    Encounter Date: 07/02/2018  PT End of Session - 07/03/18 1207    Visit Number  19    Date for PT Re-Evaluation  07/28/18    PT Start Time  1115    PT Stop Time  1200    PT Time Calculation (min)  45 min    Activity Tolerance  Patient tolerated treatment well    Behavior During Therapy  [facility] for tasks assessed/performed       Past Medical History:  Diagnosis Date  . Arthritis   . Chicken pox   . Cholecystitis 11/2011   Did not require sgy - Dr. Staci Acosta - [facility]  (cholelithiasis)  . H/O Clostridium difficile infection   . IBS (irritable bowel syndrome)   . MRSA exposure 2005   Spider bite  . MVP (mitral valve prolapse)    Stable - Dr. Ubaldo Glassing  . Rheumatic fever     Past Surgical History:  Procedure Laterality Date  . CATARACT EXTRACTION  [id number]  . MUSCLE BIOPSY      There were no vitals filed for this visit.  Subjective Assessment - 07/02/18 1347    Subjective  Pt noticed her neck popped a week ago but had no numbness/tingling in arms.  After the pop, her neck was hurting more. 4-5/10 with turning 6/10. Today,  4/10.     Pertinent History  Pt had a colonscopy which showed internal hemorrhoids, polyp that were benign and removed. Pt had lost 40 lb between May 2018 to current. Pt has felt she lost muscle mass through her weight loss which she did not following any diets. Pt is now trying has enough protein for build muscles but she has not worked with a nutritionist.            [facility] PT Assessment - 07/02/18 1349      Palpation   Palpation comment  occiput mm B with increased tightness                    [facility] Adult PT Treatment/Exercise - 07/02/18  1349      Neuro Re-ed    Neuro Re-ed Details   see pt instructions      Manual Therapy   Manual therapy comments  distraction at occiput and STM at occiptal mm B                   PT Long Term Goals - 04/15/18 1206      PT LONG TERM GOAL #1   Title  Pt will decrease her PFDI score from 47% to < 42% in order to improve pelvic floor function ( 6/4/: 36%)     Time  8    Period  Weeks    Status  Achieved      PT LONG TERM GOAL #2   Title  Pt will report compliance with proper toileting technique and not squatting/ hovering over the toilet in public restrooms in order to promote pelvic health    Time  2    Period  Weeks    Status  Achieved      PT LONG TERM GOAL #3   Title  Pt will demo IND with  pelvic floor exercises     Time  12    Period  Weeks    Status  On-going      PT LONG TERM GOAL #4   Title  Pt will be referred to community fitness classes a senior center for overall strengthening     Time  4    Period  Weeks    Status  Achieved      PT LONG TERM GOAL #5   Title  Pt will improve her bowel movements from every 3 days to daily and Stool Type  Bristol Scale from  Type 4 ( 50%) and Type 7 ( 50%) to Type 4 ( 75% of the time) in order to improve GI/ pelvic function   ( 6/4: every other day, Type 4 ( 75%) , 7/16: with colace)      Time  8    Period  Weeks    Status  Achieved      Additional Long Term Goals   Additional Long Term Goals  Yes      PT LONG TERM GOAL #6   Title  Pt will demo less pronation in B with gait and mini squats in order to miminize risk for knee and pelvic floor dysfunctions    Time  8    Period  Weeks    Status  Achieved      PT LONG TERM GOAL #7   Title  Pt will be compliant with scoliosis HEP to minimize L midback ( shoulder blade area)  while sitting     Time  12    Period  Weeks    Status  On-going      PT LONG TERM GOAL #8   Title  Pt will report decreased leakage when gettign to the bathroom  1-3x / night across 2 x week  to less than 2x/ night across < 2 x night in order to improve sleep and  pelvic function    Time  8    Period  Weeks    Status  New            Plan - 07/03/18 1207    Clinical Impression Statement  Pt demo'd less occiputal mm tensions post Tx. Pt achieved less forwward head posture and less dowager's hump. Pt performed her previous neck exercises incorrectly which is likely related to her increased neck pain. Following today's Tx, pt demo'd new HEP correctly and demod'd improved cervical / scapular retraction. Pt continues to benefit from skilld PT.    PT Frequency  1x / week    PT Duration  12 weeks    PT Treatment/Interventions  Neuromuscular re-education;Balance training;Therapeutic exercise;Orthotic Fit/Training;Functional mobility training;Moist Heat;Traction;Therapeutic activities;Patient/family education;Manual techniques    Consulted and Agree with Plan of Care  Patient       Patient will benefit from skilled therapeutic intervention in order to improve the following deficits and impairments:  Postural dysfunction, Increased muscle spasms, Decreased scar mobility, Decreased coordination, Decreased mobility, Decreased range of motion, Decreased endurance, Decreased activity tolerance, Difficulty walking, Decreased safety awareness, Decreased balance, Abnormal gait, Decreased strength, Hypomobility, Improper body mechanics  Visit Diagnosis: Myalgia  Other lack of coordination  Abnormal posture  Other idiopathic scoliosis, thoracolumbar region  Pain in right hip  Muscle weakness (generalized)  Sacrococcygeal disorders, not elsewhere classified     Problem List Patient Active Problem List   Diagnosis Date Noted  . Itching 07/21/2017  . Asthma 07/16/2016  . Urinary incontinence 07/16/2016  .  SOB (shortness of breath) 04/01/2016  . Bilateral shoulder pain 02/24/2016  . Neck fullness 01/28/2016  . Routine general medical examination at a health care facility  07/25/2015  . Health care maintenance 10/09/2014  . Neck pain 11/26/2013  . Hypercholesterolemia 11/26/2013  . History of colonic polyps 05/11/2013  . Hyperbilirubinemia 01/18/2013  . GERD (gastroesophageal reflux disease) 12/03/2012  . Cholelithiasis 11/07/2012  . IBS (irritable bowel syndrome) 11/07/2012  . History of rheumatic fever 08/28/2012  . MVP (mitral valve prolapse) 08/28/2012    Jerl Mina ,PT, DPT, E-RYT  07/03/2018, 12:12 PM  [place] MAIN [facility] SERVICES [address] [place], [place], [address] Phone: [phone]   Fax:  [phone]  Name: Lynn Mann MRN: [id number] Date of Birth: 06/20/1951

## 2018-07-13 ENCOUNTER — Ambulatory Visit: Payer: Self-pay | Admitting: Urology

## 2018-07-16 ENCOUNTER — Ambulatory Visit: Payer: BLUE CROSS/BLUE SHIELD | Admitting: Physical Therapy

## 2018-07-16 DIAGNOSIS — M791 Myalgia, unspecified site: Secondary | ICD-10-CM | POA: Diagnosis not present

## 2018-07-16 DIAGNOSIS — M4125 Other idiopathic scoliosis, thoracolumbar region: Secondary | ICD-10-CM

## 2018-07-16 DIAGNOSIS — R293 Abnormal posture: Secondary | ICD-10-CM

## 2018-07-16 DIAGNOSIS — M6281 Muscle weakness (generalized): Secondary | ICD-10-CM

## 2018-07-16 DIAGNOSIS — M25551 Pain in right hip: Secondary | ICD-10-CM

## 2018-07-16 DIAGNOSIS — R278 Other lack of coordination: Secondary | ICD-10-CM

## 2018-07-16 NOTE — Therapy (Signed)
[place] [facility] SERVICES [address] [place], [place], [address] Phone: [phone]   Fax:  [phone]  Physical Therapy Treatment  Patient Details  Name: Lynn Mann MRN: [id number] Date of Birth: 10-Jan-1951 Referring Provider (PT): Einar Pheasant, MD    Encounter Date: 07/16/2018  PT End of Session - 07/16/18 2200    Visit Number  20    Date for PT Re-Evaluation  07/28/18    PT Start Time  1308    PT Stop Time  1350    PT Time Calculation (min)  42 min    Activity Tolerance  Patient tolerated treatment well    Behavior During Therapy  [facility] for tasks assessed/performed       Past Medical History:  Diagnosis Date  . Arthritis   . Chicken pox   . Cholecystitis 11/2011   Did not require sgy - Dr. Staci Acosta - [facility]  (cholelithiasis)  . H/O Clostridium difficile infection   . IBS (irritable bowel syndrome)   . MRSA exposure 2005   Spider bite  . MVP (mitral valve prolapse)    Stable - Dr. Ubaldo Glassing  . Rheumatic fever     Past Surgical History:  Procedure Laterality Date  . CATARACT EXTRACTION  [id number]  . MUSCLE BIOPSY      There were no vitals filed for this visit.  Subjective Assessment - 07/16/18 1309    Subjective  Pr reported she feels her neck is getting better and it got better after her root cancal. Last night, pt had bowel movement and it will would not come out.      Pertinent History  Pt had a colonscopy which showed internal hemorrhoids, polyp that were benign and removed. Pt had lost 40 lb between May 2018 to current. Pt has felt she lost muscle mass through her weight loss which she did not following any diets. Pt is now trying has enough protein for build muscles but she has not worked with a nutritionist.            [facility] PT Assessment - 07/16/18 1334      Assessment   Medical Diagnosis  slumped posture,                 Pelvic Floor Special Questions - 07/16/18 1332    Skin Integrity  --    rash without open wounds around anus    Pelvic Floor Internal Exam  pt consented verbally without contraindications    Exam Type  Rectal    Palpation  no tightness EAS, tenderness at 6 o'clock tightness L puborectalis , no external hemorrhoids noted     Strength  good squeeze, good lift, able to hold agaisnt strong resistance                     PT Long Term Goals - 07/16/18 1346      PT LONG TERM GOAL #1   Title  Pt will decrease her PFDI score from 47% to < 42% in order to improve pelvic floor function ( 6/4/: 36%)     Time  8    Period  Weeks    Status  Achieved      PT LONG TERM GOAL #2   Title  Pt will report compliance with proper toileting technique and not squatting/ hovering over the toilet in public restrooms in order to promote pelvic health    Time  2    Period  Weeks    Status  Achieved      PT LONG TERM GOAL #3   Title  Pt will demo IND with pelvic floor exercises     Time  12    Period  Weeks    Status  Achieved      PT LONG TERM GOAL #4   Title  Pt will be referred to community fitness classes a senior center for overall strengthening     Time  4    Period  Weeks    Status  Achieved      PT LONG TERM GOAL #5   Title  Pt will improve her bowel movements from every 3 days to daily and Stool Type  Bristol Scale from  Type 4 ( 50%) and Type 7 ( 50%) to Type 4 ( 75% of the time) in order to improve GI/ pelvic function   ( 6/4: every other day, Type 4 ( 75%) , 7/16: with colace)      Time  8    Period  Weeks    Status  Achieved      PT LONG TERM GOAL #6   Title  Pt will demo less pronation in B with gait and mini squats in order to miminize risk for knee and pelvic floor dysfunctions    Time  8    Period  Weeks    Status  Achieved      PT LONG TERM GOAL #7   Title  Pt will be compliant with scoliosis HEP to minimize L midback ( shoulder blade area)  while sitting     Time  12    Period  Weeks    Status  Achieved      PT LONG TERM GOAL #8    Title  Pt will report decreased leakage when getting to the bathroom  1-3x / night across 2 x week to less than 2x/ night across < 2 x week in order to improve sleep and  pelvic function    Time  8    Period  Weeks    Status  Achieved            Plan - 07/16/18 2200    Clinical Impression Statement Pt has had improvements with neck issues which indicates scoliosis specific HEP is effective. Pt reported she had difficulty with bowel elimination recently which after having had no issues for some time.  With her rectal assessment today, pt demo'd proper coordination with pelvic floor and no longer showed straining. Pt showed slightly increased pelvic floor mm tensions. Suspect this relapse of bowel elimination difficulty is related to pt showing slumped posture today and her report of sitting on a bar stool more often which signifies a more slumped posture. Pt was provided education about the role of slumped posture and was shown anatomy pictures regarding rectal position in relationship to posterior tilt of pelvis in slumped posture. Pt voiced understanding. Pt required encouragement to maintain compliance HEP to minimize relapse of Sx. Plan to d/c at next session with summary of HEP.        PT Frequency  1x / week    PT Duration  12 weeks    PT Treatment/Interventions  Neuromuscular re-education;Balance training;Therapeutic exercise;Orthotic Fit/Training;Functional mobility training;Moist Heat;Traction;Therapeutic activities;Patient/family education;Manual techniques    Consulted and Agree with Plan of Care  Patient       Patient will benefit from skilled therapeutic intervention in order to improve the following deficits and impairments:  Postural dysfunction, Increased muscle spasms, Decreased scar mobility, Decreased coordination, Decreased mobility, Decreased range of motion, Decreased endurance, Decreased activity tolerance, Difficulty walking, Decreased safety awareness, Decreased  balance, Abnormal gait, Decreased strength, Hypomobility, Improper body mechanics  Visit Diagnosis: Other lack of coordination  Myalgia  Abnormal posture  Other idiopathic scoliosis, thoracolumbar region  Pain in right hip  Muscle weakness (generalized)     Problem List Patient Active Problem List   Diagnosis Date Noted  . Itching 07/21/2017  . Asthma 07/16/2016  . Urinary incontinence 07/16/2016  . SOB (shortness of breath) 04/01/2016  . Bilateral shoulder pain 02/24/2016  . Neck fullness 01/28/2016  . Routine general medical examination at a health care facility 07/25/2015  . Health care maintenance 10/09/2014  . Neck pain 11/26/2013  . Hypercholesterolemia 11/26/2013  . History of colonic polyps 05/11/2013  . Hyperbilirubinemia 01/18/2013  . GERD (gastroesophageal reflux disease) 12/03/2012  . Cholelithiasis 11/07/2012  . IBS (irritable bowel syndrome) 11/07/2012  . History of rheumatic fever 08/28/2012  . MVP (mitral valve prolapse) 08/28/2012    Jerl Mina ,PT, DPT, E-RYT  07/16/2018, 10:02 PM  [place] [facility] SERVICES [address] [place], [place], [address] Phone: [phone]   Fax:  [phone]  Name: Lynn Mann MRN: [id number] Date of Birth: 22-Feb-1951

## 2018-07-16 NOTE — Patient Instructions (Addendum)
Sitting with feet on ground under knee to not slump onto tail bone, helping minimize hemorrhoids   On a stool, place feet onto the ring under the table to ensure perpendicular force of shin bones to a firm surface , ankles under knees , sitting on sitting bones   __  Maintain exercises

## 2018-07-24 ENCOUNTER — Other Ambulatory Visit: Payer: Self-pay | Admitting: Internal Medicine

## 2018-07-24 ENCOUNTER — Other Ambulatory Visit: Payer: Self-pay | Admitting: Pulmonary Disease

## 2018-07-24 NOTE — Telephone Encounter (Signed)
Copied from [place] [id number]. Topic: Quick Communication - Rx Refill/Question >> Jul 24, 2018  3:54 PM Blase Mess A wrote: Medication: Albuterol Sulfate ([place]) 735 (90 Base) MCG/ACT AEPB [[id number]] patient left inhaler at home she is on vacation  Has the patient contacted their pharmacy? Yes  (Agent: If no, request that the patient contact the pharmacy for the refill.) (Agent: If yes, when and what did the pharmacy advise?)  Preferred Pharmacy (with phone number or street name): [address] [place], [place] [address] [phone]  Agent: Please be advised that RX refills may take up to 3 business days. We ask that you follow-up with your pharmacy.

## 2018-07-27 ENCOUNTER — Ambulatory Visit
Admission: EM | Admit: 2018-07-27 | Discharge: 2018-07-27 | Disposition: A | Discharge status: Home / Self Care | Payer: BLUE CROSS/BLUE SHIELD | Attending: Family Medicine | Admitting: Family Medicine

## 2018-07-27 ENCOUNTER — Encounter: Payer: Self-pay | Admitting: Emergency Medicine

## 2018-07-27 ENCOUNTER — Other Ambulatory Visit: Payer: Self-pay

## 2018-07-27 DIAGNOSIS — J029 Acute pharyngitis, unspecified: Secondary | ICD-10-CM | POA: Diagnosis not present

## 2018-07-27 LAB — RAPID STREP SCREEN (MED CTR MEBANE ONLY): Streptococcus, Group A Screen (Direct): NEGATIVE

## 2018-07-27 MED ORDER — AMOXICILLIN 500 MG PO TABS: 500.0000 mg | ORAL_TABLET | Freq: Two times a day (BID) | ORAL | 0 refills | Status: AC

## 2018-07-27 NOTE — Discharge Instructions (Addendum)
SORE THROAT: The treatment of sore throat depends upon the cause; strep throat is treated with an antibiotic, while viral pharyngitis is treated with rest, pain relievers. If prescribed, finish the entire course of antibiotics .Increase rest, fluids, and OTC meds as needed . If you have any questions or concerns, please call us or stop back at any time and we will be happy to help you. If your symptoms worsen, f/u with our office or go to the ER   YOUR RAPID STREP TEST WAS NEGATIVE TODAY. A CULTURE HAS BEEN SENT OUT AND WILL RESULT IN 1-3 DAYS. DUE TO YOUR RECENT EXPOSURE AND HISTORY, YOU HAVE DECIDED TO BEGIN TREATMENT FOR STREP WITH AMOXICILLIN AT THIS TIME.

## 2018-07-27 NOTE — ED Provider Notes (Signed)
[facility]    CSN: [id number] Arrival date & time: 07/27/18  1319     History   Chief Complaint Chief Complaint  Patient presents with  . Sore Throat    HPI Lynn Mann is a 67 y.o. female. Patient presents with 2-3 day history of mild sore throat and swollen lymph nodes of the neck. She denies fever, fatigue, body aches, cough, nasal congestion. Patient states she was exposed to strep throat a few days ago. She has a history of rheumatic fever and wants to ensure that she does not have strep. Patient has not taken any medication for symptoms. She has no other concerns today.  HPI  Past Medical History:  Diagnosis Date  . Arthritis   . Chicken pox   . Cholecystitis 11/2011   Did not require sgy - Dr. Staci Acosta - [facility]  (cholelithiasis)  . H/O Clostridium difficile infection   . IBS (irritable bowel syndrome)   . MRSA exposure 2005   Spider bite  . MVP (mitral valve prolapse)    Stable - Dr. Ubaldo Glassing  . Rheumatic fever     Patient Active Problem List   Diagnosis Date Noted  . Itching 07/21/2017  . Asthma 07/16/2016  . Urinary incontinence 07/16/2016  . SOB (shortness of breath) 04/01/2016  . Bilateral shoulder pain 02/24/2016  . Neck fullness 01/28/2016  . Routine general medical examination at a health care facility 07/25/2015  . Health care maintenance 10/09/2014  . Neck pain 11/26/2013  . Hypercholesterolemia 11/26/2013  . History of colonic polyps 05/11/2013  . Hyperbilirubinemia 01/18/2013  . GERD (gastroesophageal reflux disease) 12/03/2012  . Cholelithiasis 11/07/2012  . IBS (irritable bowel syndrome) 11/07/2012  . History of rheumatic fever 08/28/2012  . MVP (mitral valve prolapse) 08/28/2012    Past Surgical History:  Procedure Laterality Date  . CATARACT EXTRACTION  [id number]  . MUSCLE BIOPSY      OB History   No obstetric history on file.      Home Medications    Prior to Admission medications   Medication Sig Start Date End  Date Taking? Authorizing Provider  Albuterol Sulfate (PROAIR RESPICLICK) 332 (90 Base) MCG/ACT AEPB Inhale 2 puffs into the lungs as needed (for increased shortness of breath or chest tightness). 06/01/18  Yes Wilhelmina Mcardle, MD  ANUCORT-HC 25 MG suppository UNW AND I 1 SUP REC BID 12/31/17  Yes [provider]  aspirin 325 MG tablet Take by mouth.   Yes [provider]  bisacodyl (DULCOLAX) 5 MG EC tablet Take 5 mg by mouth daily as needed for moderate constipation.   Yes [provider]  fluticasone (FLONASE) 50 MCG/ACT nasal spray SHAKE LIQUID AND USE 1 SPRAY IN EACH NOSTRIL TWICE DAILY 06/09/18  Yes Einar Pheasant, MD  Polyethyl Glycol-Propyl Glycol (SYSTANE OP) Apply to eye.   Yes [provider]  senna (SENOKOT) 8.6 MG TABS tablet Take 1 tablet by mouth daily.   Yes [provider]  sodium chloride (OCEAN) 0.65 % SOLN nasal spray Place 2 sprays into both nostrils every 2 (two) hours while awake. 12/16/15  Yes Betancourt, Tina A, NP  amoxicillin (AMOXIL) 500 MG tablet Take 1 tablet (500 mg total) by mouth 2 (two) times daily for 10 days. 07/27/18 08/06/18  Danton Clap, PA-C    Family History Family History  Problem Relation Age of Onset  . Arthritis Mother   . Stroke Mother   . Diabetes Mother   . Hypertension Mother   .  Arthritis Father   . Stroke Father   . Diabetes Paternal Grandmother   . Cancer Paternal Uncle        colon  . Heart disease Other        maternal and paternal side  . Breast cancer Paternal Aunt   . Breast cancer Cousin   . Breast cancer Cousin        female cousin  . Breast cancer Cousin     Social History Social History   Tobacco Use  . Smoking status: Never Smoker  . Smokeless tobacco: Never Used  Substance Use Topics  . Alcohol use: Yes    Alcohol/week: 0.0 standard drinks    Comment: Rarely  . Drug use: No     Allergies   Other; Adhesive [tape]; Tartrazine; and Yellow dyes  (non-tartrazine)   Review of Systems Review of Systems  Constitutional: Negative for chills, fatigue and fever.  HENT: Positive for sore throat. Negative for congestion, ear pain, postnasal drip, rhinorrhea, sinus pressure, sinus pain and trouble swallowing.   Eyes: Negative for discharge and redness.  Respiratory: Negative for cough, shortness of breath and wheezing.   Gastrointestinal: Negative for abdominal pain, nausea and vomiting.  Musculoskeletal: Positive for neck pain. Negative for arthralgias and myalgias.  Skin: Negative for color change and rash.  Neurological: Negative for dizziness, weakness and headaches.  Hematological: Positive for adenopathy.     Physical Exam Triage Vital Signs ED Triage Vitals  Enc Vitals Group     BP 07/27/18 1357 117/70     Pulse Rate 07/27/18 1357 (!) 58     Resp 07/27/18 1357 18     Temp 07/27/18 1357 98.5 F (36.9 C)     Temp Source 07/27/18 1357 Oral     SpO2 07/27/18 1357 99 %     Weight 07/27/18 1354 148 lb (67.1 kg)     Height 07/27/18 1354 5\' 7"  (1.702 m)     Head Circumference --      Peak Flow --      Pain Score 07/27/18 1354 2     Pain Loc --      Pain Edu? --      Excl. in [place]? --    No data found.  Updated Vital Signs BP 117/70 (BP Location: Left Arm)   Pulse (!) 58   Temp 98.5 F (36.9 C) (Oral)   Resp 18   Ht 5\' 7"  (1.702 m)   Wt 148 lb (67.1 kg)   SpO2 99%   BMI 23.18 kg/m       Physical Exam Vitals signs and nursing note reviewed.  Constitutional:      General: She is not in acute distress.    Appearance: She is well-developed and normal weight. She is not ill-appearing.  HENT:     Head: Normocephalic and atraumatic.     Right Ear: Tympanic membrane and ear canal normal. No middle ear effusion. Tympanic membrane is not erythematous.     Left Ear: Tympanic membrane and ear canal normal.  No middle ear effusion. Tympanic membrane is not erythematous.     Mouth/Throat:     Mouth: Mucous membranes are  moist.     Pharynx: Oropharynx is clear. Uvula midline. Posterior oropharyngeal erythema (mild erythema post pharynx ) present. No oropharyngeal exudate.     Tonsils: No tonsillar exudate. Swelling: 0 on the right. 0 on the left.  Eyes:     Conjunctiva/sclera: Conjunctivae normal.  Neck:  Musculoskeletal: Normal range of motion and neck supple.  Cardiovascular:     Rate and Rhythm: Normal rate and regular rhythm.     Heart sounds: No murmur.  Pulmonary:     Effort: Pulmonary effort is normal. No respiratory distress.     Breath sounds: Normal breath sounds. No wheezing, rhonchi or rales.  Lymphadenopathy:     Cervical: Cervical adenopathy ( +tender bilat enlarged cervical lymph nodes) present.  Skin:    General: Skin is warm and dry.     Findings: No rash.  Neurological:     Mental Status: She is alert and oriented to person, place, and time.  Psychiatric:        Mood and Affect: Mood normal.        Behavior: Behavior normal.      UC Treatments / Results  Labs (all labs ordered are listed, but only abnormal results are displayed) Labs Reviewed  RAPID STREP SCREEN ([facility] ONLY)  CULTURE, GROUP A STREP [facility])    EKG None  Radiology No results found.  Procedures Procedures (including critical care time)  Medications Ordered in UC Medications - No data to display  Initial Impression / Assessment and Plan / UC Course  I have reviewed the triage vital signs and the nursing notes.  Pertinent labs & imaging results that were available during my care of the patient were reviewed by me and considered in my medical decision making (see chart for details).   RST negative today. Culture sent out. Patient states she has had strep in the past with very mild and sometimes relatively no symptoms. She has had rheumatic fever due to untreated strep throat. Advised patient due to her recent exposure to strep and history, will begin amoxicillin. Patient agreeable.     Final Clinical Impressions(s) / UC Diagnoses   Final diagnoses:  Pharyngitis, unspecified etiology     Discharge Instructions     SORE THROAT: The treatment of sore throat depends upon the cause; strep throat is treated with an antibiotic, while viral pharyngitis is treated with rest, pain relievers. If prescribed, finish the entire course of antibiotics .Increase rest, fluids, and OTC meds as needed . If you have any questions or concerns, please call us or stop back at any time and we will be happy to help you. If your symptoms worsen, f/u with our office or go to the ER   YOUR RAPID STREP TEST WAS NEGATIVE TODAY. A CULTURE HAS BEEN SENT OUT AND WILL RESULT IN 1-3 DAYS. DUE TO YOUR RECENT EXPOSURE AND HISTORY, YOU HAVE DECIDED TO BEGIN TREATMENT FOR STREP WITH AMOXICILLIN AT THIS TIME.    ED Prescriptions    Medication Sig Dispense Auth. Provider   amoxicillin (AMOXIL) 500 MG tablet Take 1 tablet (500 mg total) by mouth 2 (two) times daily for 10 days. 20 tablet Danton Clap, PA-C     Controlled Substance Prescriptions [place] Controlled Substance Registry consulted? Not Applicable   Gretta Cool 07/29/18 5631

## 2018-07-27 NOTE — ED Triage Notes (Addendum)
Pt c/o mild sore throat, swollen cervical lymph nodes. Started 1-2 days ago. Son recently diagnosed with strep.

## 2018-07-28 ENCOUNTER — Ambulatory Visit: Payer: BLUE CROSS/BLUE SHIELD | Admitting: Physical Therapy

## 2018-07-28 DIAGNOSIS — M25551 Pain in right hip: Secondary | ICD-10-CM

## 2018-07-28 DIAGNOSIS — M791 Myalgia, unspecified site: Secondary | ICD-10-CM

## 2018-07-28 DIAGNOSIS — R278 Other lack of coordination: Secondary | ICD-10-CM

## 2018-07-28 DIAGNOSIS — R293 Abnormal posture: Secondary | ICD-10-CM

## 2018-07-28 DIAGNOSIS — M4125 Other idiopathic scoliosis, thoracolumbar region: Secondary | ICD-10-CM

## 2018-07-28 NOTE — Therapy (Signed)
[place] MAIN [facility] SERVICES [address] [place], [place], [address] Phone: [phone]   Fax:  [phone]  Physical Therapy Treatment / Discharge Summary   Patient Details  Name: Lynn Mann MRN: [id number] Date of Birth: 1950-09-24 Referring Provider (PT): Einar Pheasant, MD    Encounter Date: 07/28/2018  PT End of Session - 07/28/18 1439    Visit Number  21  (Pended)     Date for PT Re-Evaluation  07/28/18  (Pended)     PT Start Time  1406  (Pended)     PT Stop Time  1440  (Pended)     PT Time Calculation (min)  34 min  (Pended)     Activity Tolerance  Patient tolerated treatment well  (Pended)     Behavior During Therapy  [facility] for tasks assessed/performed  (Pended)        Past Medical History:  Diagnosis Date  . Arthritis   . Chicken pox   . Cholecystitis 11/2011   Did not require sgy - Dr. Staci Acosta - [facility]  (cholelithiasis)  . H/O Clostridium difficile infection   . IBS (irritable bowel syndrome)   . MRSA exposure 2005   Spider bite  . MVP (mitral valve prolapse)    Stable - Dr. Ubaldo Glassing  . Rheumatic fever     Past Surgical History:  Procedure Laterality Date  . CATARACT EXTRACTION  [id number]  . MUSCLE BIOPSY      There were no vitals filed for this visit.  Subjective Assessment - 07/28/18 1413    Subjective  Pt reported she found out she has osteoporosis.     Pertinent History  Pt had a colonscopy which showed internal hemorrhoids, polyp that were benign and removed. Pt had lost 40 lb between May 2018 to current. Pt has felt she lost muscle mass through her weight loss which she did not following any diets. Pt is now trying has enough protein for build muscles but she has not worked with a nutritionist.            [facility] PT Assessment - 07/28/18 1505      Observation/Other Assessments   Observations  minor cues for cervical retraction.  Otherwise pt had good carry over with less forward head posture      Floor to  Stand   Comments  forearm propped on bench with t/f to standing . proper scapular stabilization                   [facility] Adult PT Treatment/Exercise - 07/28/18 1504      Therapeutic Activites    Other Therapeutic Activities  see pt instructions                   PT Long Term Goals - 07/28/18 1418      PT LONG TERM GOAL #1   Title  Pt will decrease her PFDI score from 47% to < 42% in order to improve pelvic floor function ( 6/4/: 36%)     Time  8    Period  Weeks    Status  Achieved      PT LONG TERM GOAL #2   Title  Pt will report compliance with proper toileting technique and not squatting/ hovering over the toilet in public restrooms in order to promote pelvic health    Time  2    Period  Weeks    Status  Achieved  PT LONG TERM GOAL #3   Title  Pt will demo IND with pelvic floor exercises     Time  12    Period  Weeks    Status  Achieved      PT LONG TERM GOAL #4   Title  Pt will be referred to community fitness classes a senior center for overall strengthening     Time  4    Period  Weeks    Status  Achieved      PT LONG TERM GOAL #5   Title  Pt will improve her bowel movements from every 3 days to daily and Stool Type  Bristol Scale from  Type 4 ( 50%) and Type 7 ( 50%) to Type 4 ( 75% of the time) in order to improve GI/ pelvic function   ( 6/4: every other day, Type 4 ( 75%) , 7/16: with colace)      Time  8    Period  Weeks    Status  Achieved      PT LONG TERM GOAL #6   Title  Pt will demo less pronation in B with gait and mini squats in order to miminize risk for knee and pelvic floor dysfunctions    Time  8    Period  Weeks    Status  Achieved      PT LONG TERM GOAL #7   Title  Pt will be compliant with scoliosis HEP to minimize L midback ( shoulder blade area)  while sitting     Time  12    Period  Weeks    Status  Achieved      PT LONG TERM GOAL #8   Title  Pt will report decreased leakage when getting to the bathroom  1-3x  / night across 2 x week to less than 2x/ night across < 2 x week in order to improve sleep and  pelvic function    Time  8    Period  Weeks    Status  Achieved            Plan - 07/28/18 1426    Clinical Impression Statement  Pt has achieved 100% of her goals.  Pt has not had any severe constipation since [facility].  Pt is having less urinary leakage accidents from 2-3 x night to 3-4 x week. The only time the leakage occurs now is when she wakes up at night and is trying to get out of bed.  Leakage during the day has resolved except for when traveling . Pt has been tested neg for OSA, thus nocturia is not likely associated with OSA. Pt had an appt with a urologist in Jan but she has cancelled it due to insurance coverage starting with a new deductible in Jan. Pt has seen a GYN MD for the first time in a long time as referred by Pelvic PT and had no significant findings except for osteoporosis status from DEXA scan. Pt's GI doctor ordered CT scans which were neg.  DPT educated pt to f/u with urologist regarding remaining leakage issue that occurs 4x week with getting out of bed in the middle night when she feels the urge to urinate.  Pt's neck pain  has improved with increased ROM and more upright position. Pt is IND with scoliosis HEP.  Pt's back, hip, deep core strength have improved.  Pt states her rehab experience has been "life-changing."  Pt is ready to d/c   PT  Frequency  1x / week    PT Duration  12 weeks    PT Treatment/Interventions  Neuromuscular re-education;Balance training;Therapeutic exercise;Orthotic Fit/Training;Functional mobility training;Moist Heat;Traction;Therapeutic activities;Patient/family education;Manual techniques    Consulted and Agree with Plan of Care  Patient       Patient will benefit from skilled therapeutic intervention in order to improve the following deficits and impairments:  Postural dysfunction, Increased muscle spasms, Decreased scar mobility, Decreased  coordination, Decreased mobility, Decreased range of motion, Decreased endurance, Decreased activity tolerance, Difficulty walking, Decreased safety awareness, Decreased balance, Abnormal gait, Decreased strength, Hypomobility, Improper body mechanics  Visit Diagnosis: Other lack of coordination  Myalgia  Abnormal posture  Other idiopathic scoliosis, thoracolumbar region  Pain in right hip     Problem List Patient Active Problem List   Diagnosis Date Noted  . Itching 07/21/2017  . Asthma 07/16/2016  . Urinary incontinence 07/16/2016  . SOB (shortness of breath) 04/01/2016  . Bilateral shoulder pain 02/24/2016  . Neck fullness 01/28/2016  . Routine general medical examination at a health care facility 07/25/2015  . Health care maintenance 10/09/2014  . Neck pain 11/26/2013  . Hypercholesterolemia 11/26/2013  . History of colonic polyps 05/11/2013  . Hyperbilirubinemia 01/18/2013  . GERD (gastroesophageal reflux disease) 12/03/2012  . Cholelithiasis 11/07/2012  . IBS (irritable bowel syndrome) 11/07/2012  . History of rheumatic fever 08/28/2012  . MVP (mitral valve prolapse) 08/28/2012    Jerl Mina ,PT, DPT, E-RYT  07/28/2018, 3:07 PM  [place] MAIN [facility] SERVICES [address] [place], [place], [address] Phone: [phone]   Fax:  [phone]  Name: OLAMIDE LAHAIE MRN: [id number] Date of Birth: [birth year]

## 2018-07-28 NOTE — Patient Instructions (Addendum)
Weight bearing on forearms to build bone density:    Transition from standing to floor if you have hypertension or can not have your head lower than your heart  Wide squat in front of a CHAIR  Forearms onto the chair Lower knees down into double kneeling   floor   To get up Walk on your knees to the front of chair again Forearms onto the chair Inhale, exhale, pushing onto the forearms to life  lifts hips up, kneeping knees bent hands on thighs, then hips then pause HERE  to avoid (moving too quickly up/ blood rush) -->  knees glide forward and roll  Hips up instead of hinging spine up   * KEEP YOUR HEAD AND HEART LEVELLED , NEVER LETTING YOUR HEAD GET BELOW YOUR HEART _______   3# bdumbbells in ski track stance with slight lean bicepcurls   Triceps  10 reps and switch legs  Exhale to lift    ______

## 2018-07-30 LAB — CULTURE, GROUP A STREP (THRC)

## 2018-08-05 ENCOUNTER — Encounter: Payer: BLUE CROSS/BLUE SHIELD | Admitting: Internal Medicine

## 2018-08-18 ENCOUNTER — Encounter: Payer: BLUE CROSS/BLUE SHIELD | Admitting: Physical Therapy

## 2018-08-20 ENCOUNTER — Encounter

## 2018-08-20 ENCOUNTER — Ambulatory Visit: Payer: Self-pay | Admitting: Urology

## 2018-09-15 ENCOUNTER — Encounter: Payer: BLUE CROSS/BLUE SHIELD | Admitting: Physical Therapy

## 2018-10-09 ENCOUNTER — Ambulatory Visit: Payer: Self-pay

## 2018-10-09 NOTE — Telephone Encounter (Signed)
Patient called in with c/o "fever." She says "the fever started last yesterday afternoon and today my fever is 100.9. I have cough w/chest congestion, nothing is coming up when I cough; runny nose with congestion, earache this morning, headache and facial pain this morning and body aches/chills. My husband was sick with the same symptoms and fever for about 3 days and went back to work today. My son-in-law was diagnosed with the flu, but I haven't been around him in a few days before he was diagnosed. I've been taking aspirin, tylenol, OTC nasal decongestant and guaifenesin." I asked about travel, she denies recent travel or exposure to anyone who has traveled, low risk for COVID-19. According to protocol, edited to see PCP within 24 hours, no availability with PCP or any provider in the practice. I offered the patient to go to UC or come to the [facility] tomorrow, she agrees to the Saturday Clinic. Appointment scheduled for tomorrow, 10/10/18 at 45 with Philis Nettle, FNP, care advice given, patient verbalized understanding. She says if her husband will not be able to bring her tomorrow, she will call back to cancel and go to the [place], I advised to call before 1900 tonight as we close at that time at [facility].  Answer Assessment - Initial Assessment Questions 1. TEMPERATURE: "What is the most recent temperature?"  "How was it measured?"      100.9 2. ONSET: "When did the fever start?"      Late yesterday afternoon 3. SYMPTOMS: "Do you have any other symptoms besides the fever?"  (e.g., colds, headache, sore throat, earache, cough, rash, diarrhea, vomiting, abdominal pain)     Cough w/chest congestion, runny nose, headache, earache this morning 4. CAUSE: If there are no symptoms, ask: "What do you think is causing the fever?"      I don't know; husband had the same symptoms for 3 days  5. CONTACTS: "Does anyone else in the family have an infection?"     Husband had symptoms for 3 days (fever);  son-in-law flu, but not been around him. 6. TREATMENT: "What have you done so far to treat this fever?" (e.g., medications)     Taken aspirin and tylenol, OTC nasal decongestant and guafenisin 7. IMMUNOCOMPROMISE: "Do you have of the following: diabetes, HIV positive, splenectomy, cancer chemotherapy, chronic steroid treatment, transplant patient, etc."    No 8. PREGNANCY: "Is there any chance you are pregnant?" "When was your last menstrual period?"     No 9. TRAVEL: "Have you traveled out of the country in the last month?" (e.g., travel history, exposures)     No  Protocols used: FEVER-A-AH

## 2018-10-10 ENCOUNTER — Ambulatory Visit (INDEPENDENT_AMBULATORY_CARE_PROVIDER_SITE_OTHER): Payer: PPO | Admitting: Family Medicine

## 2018-10-10 ENCOUNTER — Other Ambulatory Visit: Payer: Self-pay

## 2018-10-10 VITALS — BP 124/84 | HR 98 | Temp 99.1°F | Wt 152.0 lb

## 2018-10-10 DIAGNOSIS — R05 Cough: Secondary | ICD-10-CM

## 2018-10-10 DIAGNOSIS — H9209 Otalgia, unspecified ear: Secondary | ICD-10-CM

## 2018-10-10 DIAGNOSIS — J111 Influenza due to unidentified influenza virus with other respiratory manifestations: Secondary | ICD-10-CM

## 2018-10-10 DIAGNOSIS — R0981 Nasal congestion: Secondary | ICD-10-CM

## 2018-10-10 DIAGNOSIS — R509 Fever, unspecified: Secondary | ICD-10-CM

## 2018-10-10 DIAGNOSIS — R5383 Other fatigue: Secondary | ICD-10-CM | POA: Diagnosis not present

## 2018-10-10 DIAGNOSIS — Z20828 Contact with and (suspected) exposure to other viral communicable diseases: Secondary | ICD-10-CM | POA: Diagnosis not present

## 2018-10-10 DIAGNOSIS — J3489 Other specified disorders of nose and nasal sinuses: Secondary | ICD-10-CM | POA: Diagnosis not present

## 2018-10-10 LAB — POC INFLUENZA A&B (BINAX/QUICKVUE)
Influenza A, POC: NEGATIVE
Influenza B, POC: NEGATIVE

## 2018-10-10 MED ORDER — ALBUTEROL SULFATE HFA 108 (90 BASE) MCG/ACT IN AERS: 2.0000 | INHALATION_SPRAY | Freq: Four times a day (QID) | RESPIRATORY_TRACT | 0 refills | Status: DC | PRN

## 2018-10-10 NOTE — Patient Instructions (Signed)
+flu exposure from son in law   Influenza, Adult Influenza is also called "the flu." It is an infection in the lungs, nose, and throat (respiratory tract). It is caused by a virus. The flu causes symptoms that are similar to symptoms of a cold. It also causes a high fever and body aches. The flu spreads easily from person to person (is contagious). Getting a flu shot (influenza vaccination) every year is the best way to prevent the flu. What are the causes? This condition is caused by the influenza virus. You can get the virus by:  Breathing in droplets that are in the air from the cough or sneeze of a person who has the virus.  Touching something that has the virus on it (is contaminated) and then touching your mouth, nose, or eyes. What increases the risk? Certain things may make you more likely to get the flu. These include:  Not washing your hands often.  Having close contact with many people during cold and flu season.  Touching your mouth, eyes, or nose without first washing your hands.  Not getting a flu shot every year. You may have a higher risk for the flu, along with serious problems such as a lung infection (pneumonia), if you:  Are older than 65.  Are pregnant.  Have a weakened disease-fighting system (immune system) because of a disease or taking certain medicines.  Have a long-term (chronic) illness, such as: ? Heart, kidney, or lung disease. ? Diabetes. ? Asthma.  Have a liver disorder.  Are very overweight (morbidly obese).  Have anemia. This is a condition that affects your red blood cells. What are the signs or symptoms? Symptoms usually begin suddenly and last 4-14 days. They may include:  Fever and chills.  Headaches, body aches, or muscle aches.  Sore throat.  Cough.  Runny or stuffy (congested) nose.  Chest discomfort.  Not wanting to eat as much as normal (poor appetite).  Weakness or feeling tired (fatigue).  Dizziness.  Feeling  sick to your stomach (nauseous) or throwing up (vomiting). How is this treated? If the flu is found early, you can be treated with medicine that can help reduce how bad the illness is and how long it lasts (antiviral medicine). This may be given by mouth (orally) or through an IV tube. Taking care of yourself at home can help your symptoms get better. Your doctor may suggest:  Taking over-the-counter medicines.  Drinking plenty of fluids. The flu often goes away on its own. If you have very bad symptoms or other problems, you may be treated in a hospital. Follow these instructions at home:     Activity  Rest as needed. Get plenty of sleep.  Stay home from work or school as told by your doctor. ? Do not leave home until you do not have a fever for 24 hours without taking medicine. ? Leave home only to visit your doctor. Eating and drinking  Take an ORS (oral rehydration solution). This is a drink that is sold at pharmacies and stores.  Drink enough fluid to keep your pee (urine) pale yellow.  Drink clear fluids in small amounts as you are able. Clear fluids include: ? Water. ? Ice chips. ? Fruit juice that has water added (diluted fruit juice). ? Low-calorie sports drinks.  Eat bland, easy-to-digest foods in small amounts as you are able. These foods include: ? Bananas. ? Applesauce. ? Rice. ? Lean meats. ? Toast. ? Crackers.  Do not eat  or drink: ? Fluids that have a lot of sugar or caffeine. ? Alcohol. ? Spicy or fatty foods. General instructions  Take over-the-counter and prescription medicines only as told by your doctor.  Use a cool mist humidifier to add moisture to the air in your home. This can make it easier for you to breathe.  Cover your mouth and nose when you cough or sneeze.  Wash your hands with soap and water often, especially after you cough or sneeze. If you cannot use soap and water, use alcohol-based hand sanitizer.  Keep all follow-up visits  as told by your doctor. This is important. How is this prevented?   Get a flu shot every year. You may get the flu shot in late summer, fall, or winter. Ask your doctor when you should get your flu shot.  Avoid contact with people who are sick during fall and winter (cold and flu season). Contact a doctor if:  You get new symptoms.  You have: ? Chest pain. ? Watery poop (diarrhea). ? A fever.  Your cough gets worse.  You start to have more mucus.  You feel sick to your stomach.  You throw up. Get help right away if you:  Have shortness of breath.  Have trouble breathing.  Have skin or nails that turn a bluish color.  Have very bad pain or stiffness in your neck.  Get a sudden headache.  Get sudden pain in your face or ear.  Cannot eat or drink without throwing up. Summary  Influenza ("the flu") is an infection in the lungs, nose, and throat. It is caused by a virus.  Take over-the-counter and prescription medicines only as told by your doctor.  Getting a flu shot every year is the best way to avoid getting the flu. This information is not intended to replace advice given to you by your health care provider. Make sure you discuss any questions you have with your health care provider. Document Released: 04/23/2008 Document Revised: 12/31/2017 Document Reviewed: 12/31/2017 Elsevier Interactive Patient Education  2019 [employer].

## 2018-10-10 NOTE — Progress Notes (Signed)
Subjective:    Patient ID: Lynn Mann, female    DOB: 02/08/1951, 68 y.o.   MRN: [id number]  HPI   Patient presents to clinic complaining of cough, body aches, fever, feeling rundown, congestion for the past 3 days.  Patient states her son-in-law whom she spent the weekend with last week and was confirmed positive for the flu on Tuesday of this week and is currently on Tamiflu.  Patient has not traveled outside of [place] recently, was with son-in-law in [place].  As far as patient knows she has not, to contact with anyone under suspicion or diagnosed with coronavirus.   Patient Active Problem List   Diagnosis Date Noted  . Itching 07/21/2017  . Asthma 07/16/2016  . Urinary incontinence 07/16/2016  . SOB (shortness of breath) 04/01/2016  . Bilateral shoulder pain 02/24/2016  . Neck fullness 01/28/2016  . Routine general medical examination at a health care facility 07/25/2015  . Health care maintenance 10/09/2014  . Neck pain 11/26/2013  . Hypercholesterolemia 11/26/2013  . History of colonic polyps 05/11/2013  . Hyperbilirubinemia 01/18/2013  . GERD (gastroesophageal reflux disease) 12/03/2012  . Cholelithiasis 11/07/2012  . IBS (irritable bowel syndrome) 11/07/2012  . History of rheumatic fever 08/28/2012  . MVP (mitral valve prolapse) 08/28/2012   Social History   Tobacco Use  . Smoking status: Never Smoker  . Smokeless tobacco: Never Used  Substance Use Topics  . Alcohol use: Yes    Alcohol/week: 0.0 standard drinks    Comment: Rarely   Review of Systems  Constitutional: + fatigue and fever.  HENT:  +congestion, ear pain, sinus pain   Eyes: Negative.   Respiratory: +cough, productive at times. No SOB or wheezing.     Cardiovascular: Negative for chest pain, palpitations and leg swelling.  Gastrointestinal: Negative for abdominal pain, diarrhea, nausea and vomiting.  Genitourinary: Negative for dysuria, frequency and urgency.  Musculoskeletal:  +body aches  Skin: Negative for color change, pallor and rash.  Neurological: Negative for syncope, light-headedness and headaches.  Psychiatric/Behavioral: The patient is not nervous/anxious.       Objective:   Physical Exam Vitals signs and nursing note reviewed.  Constitutional:      General: She is not in acute distress.    Appearance: She is not ill-appearing or toxic-appearing.     Comments: Appears tired  HENT:     Head: Normocephalic and atraumatic.     Ears:     Comments: +fullness bilat TMs    Nose: Congestion and rhinorrhea present.     Mouth/Throat:     Mouth: Mucous membranes are moist.     Pharynx: Posterior oropharyngeal erythema present. No oropharyngeal exudate.  Eyes:     General: No scleral icterus.       Right eye: No discharge.        Left eye: No discharge.     Extraocular Movements: Extraocular movements intact.     Conjunctiva/sclera: Conjunctivae normal.  Neck:     Musculoskeletal: Neck supple. No neck rigidity.  Cardiovascular:     Rate and Rhythm: Normal rate and regular rhythm.  Pulmonary:     Effort: Pulmonary effort is normal. No respiratory distress.     Breath sounds: Normal breath sounds. No wheezing, rhonchi or rales.  Abdominal:     General: Bowel sounds are normal.     Palpations: Abdomen is soft.     Tenderness: There is no abdominal tenderness.  Lymphadenopathy:     Cervical: No cervical adenopathy.  Skin:    General: Skin is warm and dry.  Neurological:     Mental Status: She is alert and oriented to person, place, and time.  Psychiatric:        Mood and Affect: Mood normal.        Behavior: Behavior normal.     Vitals:   10/10/18 1150  BP: 124/84  Pulse: 98  Temp: 99.1 F (37.3 C)  SpO2: 97%      Assessment & Plan:    Influenza - point-of-care flu swab is negative in clinic however patient has confirmed positive influenza exposure with close contact.  Patient is on day 3 of symptoms, so she does not qualify for  Tamiflu treatment.  Patient advised to remain home for at least the next week, get plenty of rest, do good handwashing.  Advised she can alternate Tylenol and Motrin for any aches, take over-the-counter Mucinex or Delsym as needed for cough suppression, keep up good fluid intake.  Patient concerned that she could be developing a pneumonia due to productive cough, advised patient that at this time her lungs do sound clear.  Sometimes we can have phlegm with cough related to influenza and is not always indicative of a bacterial infection.  Patient advised to monitor self for any worsening symptoms and if symptoms do worsen, she has been advised to call office right away.  Also explained to patient that it is not good practice to take antibiotics for pneumonia prevention, antibiotic overuse causes bacterial resistance, and there will be a time where we have very few antibiotics that work to kill bacteria. Patient does not meet current testing criteria for coronavirus, and due to positive flu contact I believe she does most likely have influenza.   Patient will keep appointment with PCP for regular follow-up as planned.  Ice return to clinic sooner if any issues arise.

## 2018-10-12 ENCOUNTER — Telehealth: Payer: Self-pay | Admitting: Internal Medicine

## 2018-10-12 DIAGNOSIS — J019 Acute sinusitis, unspecified: Secondary | ICD-10-CM

## 2018-10-12 NOTE — Telephone Encounter (Signed)
Called Pt to give her the lab results for her flu test, Pt stated she understood with no questions

## 2018-10-12 NOTE — Telephone Encounter (Signed)
Copied from [place] [phone]. Topic: General - Other >> Oct 12, 2018  9:06 AM Oneta Rack wrote:  Patient last seen by Jodelle Green, FNP on 10/10/2018 at the [place] location inquiring about her flu results, please advise

## 2018-10-13 ENCOUNTER — Other Ambulatory Visit: Payer: Self-pay | Admitting: Internal Medicine

## 2018-10-14 MED ORDER — PROBIOTIC ADVANCED PO CAPS: 1.0000 | ORAL_CAPSULE | Freq: Every day | ORAL | 0 refills | Status: DC

## 2018-10-14 MED ORDER — AMOXICILLIN-POT CLAVULANATE 875-125 MG PO TABS: 1.0000 | ORAL_TABLET | Freq: Two times a day (BID) | ORAL | 0 refills | Status: DC

## 2018-10-14 NOTE — Telephone Encounter (Signed)
I explained to patient at the visit that her flu swab done at point of care was negative, however the flu swab is not 100% and that there can be false negatives.  Due to her positive flu exposure from son-in-law, I believe she most likely had influenza.  We can send an antibiotic for her sinus/ear infection.  I would advise she continue using Mucinex and over-the-counter antihistamine such as Allegra to help reduce congestion in the ears and sinuses.  Mucinex will help to calm the cough.  Symptoms of cough and congestion often can linger and take a long time to fully resolve especially now with allergy season being upon [place].  Rest, keep up good fluid intake and do good handwashing.

## 2018-10-14 NOTE — Telephone Encounter (Signed)
Relation to pt: self  Call back number: [phone] Pharmacy: [facility] [place], [place] [phone] (Phone) [phone] (Fax)    Reason for call:  Patient was last seen 10/10/2018 by Philis Nettle for Influenza, no fever, dry cough,  exhausted, patient experiencing hot flashes, ear discomfort in both ears, dull head ache. The following OTC medication is not working ibuprofen, acetaminophen and 600 gram mucinex is not working and would like antibiotic sent in, please advise

## 2018-10-14 NOTE — Telephone Encounter (Signed)
Called Pt and told her, that Due to her son in law positive flu and being around him  that most likely she had influenza. The flu swab is not 100% and there could be false negatives.  I told the Pt that NP Lauren Guse sent in a Rx of antibiotic for her sinus/ear infection.  I also told her to also  continue using Mucinex and over-the-counter antihistamine such as Allegra to help reduce congestion in the ears and sinuses. The Mucinex will help with the cough.  I also told the Pt that Cough and congestion can linger for a long time, especially now since we are in Allergy season  I also told the Pt to get some Rest, drink plenty of  Fluid, and do good handwashing

## 2018-10-14 NOTE — Telephone Encounter (Signed)
Pt called [facility] Reason for call:  Patient was last seen 10/10/2018 by Philis Nettle for Influenza, no fever, dry cough,  exhausted, patient experiencing hot flashes, ear discomfort in both ears, dull head ache. The following OTC medication is not working ibuprofen, acetaminophen and 600 gram mucinex is not working and would like antibiotic sent in, please advise

## 2018-11-16 ENCOUNTER — Telehealth: Payer: Self-pay

## 2018-11-16 NOTE — Telephone Encounter (Signed)
Copied from [facility] [id number]. Topic: General - Inquiry >> Nov 16, 2018 12:26 PM Vernona Rieger wrote: Reason for CRM: patient would like to speak with Lorriane Shire. [phone]

## 2018-11-19 ENCOUNTER — Ambulatory Visit: Payer: Self-pay | Admitting: Urology

## 2019-01-18 ENCOUNTER — Ambulatory Visit: Payer: Self-pay | Admitting: Urology

## 2019-01-22 ENCOUNTER — Ambulatory Visit: Payer: Self-pay | Admitting: *Deleted

## 2019-01-22 NOTE — Telephone Encounter (Signed)
Message from Berneta Levins sent at 01/22/2019 3:14 PM EDT  Summary: COVID symptoms   Pt calling to find out if she should be tested for COVID. States that about a week ago she had diarrhea, fever, chills, body aches. Pt states she has not had these symptoms for over a week now and isn't sure if testing is necessary or not. Pt denies respiratory symptoms. Pt states she hasn't been out for three weeks before that except that she rides to a park and sits in a car at [facility]. Pt wants to know if she does need to be tested she must go through a drive through facility. Pt states that she is still          Returned call to patient regarding whether to get test for the virus. She had had symptoms of diarrhea, fever, chills, body aches 10 days ago. She is not having symptoms now and she had been in quarantine since then. She was wondering if she needed to test now. Explain to her that since her symptoms have gone and she has not been exposed, she should probably wait. But think about doing the antibody testing. Protocol recommend home care. Will route to [facility] at [facility] for review and recommendation. Reason for Disposition . COVID-19 Testing, questions about  Answer Assessment - Initial Assessment Questions 1. COVID-19 DIAGNOSIS: "Who made your Coronavirus (COVID-19) diagnosis?" "Was it confirmed by a positive lab test?" If not diagnosed by a HCP, ask "Are there lots of cases (community spread) where you live?" (See public health department website, if unsure)     In the community 2. ONSET: "When did the COVID-19 symptoms start?"      Last week 3. WORST SYMPTOM: "What is your worst symptom?" (e.g., cough, fever, shortness of breath, muscle aches)     None now 4. COUGH: "Do you have a cough?" If so, ask: "How bad is the cough?"       no 5. FEVER: "Do you have a fever?" If so, ask: "What is your temperature, how was it measured, and when did it start?"    Not now 6.  RESPIRATORY STATUS: "Describe your breathing?" (e.g., shortness of breath, wheezing, unable to speak)      no 7. BETTER-SAME-WORSE: "Are you getting better, staying the same or getting worse compared to yesterday?"  If getting worse, ask, "In what way?"     better 8. HIGH RISK DISEASE: "Do you have any chronic medical problems?" (e.g., asthma, heart or lung disease, weak immune system, etc.)     no 9. PREGNANCY: "Is there any chance you are pregnant?" "When was your last menstrual period?"    no 10. OTHER SYMPTOMS: "Do you have any other symptoms?"  (e.g., chills, fatigue, headache, loss of smell or taste, muscle pain, sore throat)       no  Protocols used: CORONAVIRUS (COVID-19) DIAGNOSED OR SUSPECTED-A-AH

## 2019-01-25 NOTE — Telephone Encounter (Signed)
Can schedule a doxy appt to discuss.

## 2019-01-25 NOTE — Telephone Encounter (Signed)
Patient scheduled for tomorrow at 4:30 

## 2019-01-26 ENCOUNTER — Ambulatory Visit (INDEPENDENT_AMBULATORY_CARE_PROVIDER_SITE_OTHER): Payer: PPO | Admitting: Internal Medicine

## 2019-01-26 ENCOUNTER — Other Ambulatory Visit: Payer: Self-pay

## 2019-01-26 ENCOUNTER — Encounter: Payer: Self-pay | Admitting: Internal Medicine

## 2019-01-26 DIAGNOSIS — B349 Viral infection, unspecified: Secondary | ICD-10-CM

## 2019-01-26 DIAGNOSIS — J4520 Mild intermittent asthma, uncomplicated: Secondary | ICD-10-CM | POA: Diagnosis not present

## 2019-01-26 NOTE — Progress Notes (Addendum)
Patient ID: Lynn Mann, female   DOB: [birth year], 68 y.o.   MRN: [id number]   Virtual Visit via video Note  This visit type was conducted due to national recommendations for restrictions regarding the COVID-19 pandemic (e.g. social distancing).  This format is felt to be most appropriate for this patient at this time.  All issues noted in this document were discussed and addressed.  No physical exam was performed (except for noted visual exam findings with Video Visits).   I connected with Lynn Mann by a video enabled telemedicine application and verified that I am speaking with the correct person using two identifiers. Location patient: home Location provider: work Persons participating in the virtual visit: patient, provider  I discussed the limitations, risks, security and privacy concerns of performing an evaluation and management service by video and the availability of in person appointments. The patient expressed understanding and agreed to proceed.  Interactive audio and video telecommunications were attempted between this provider and patient and were able to initially connect.  Due to technical difficulties we decided to complete the visit via telephone.  We continued and completed visit with audio only.   Reason for visit: work in appt  HPI: She reports that approximately two weeks ago, she felt cold and experienced "hard chills".  Emesis x 1 day.  Temp 100.7.  Slept for two days.  Mouth was dry.  Nose dry.  Emesis x 1 day.  Some nausea.  Drank ginger ale.  Some body aches.  One day of diarrhea.  Some decreased appetite.  No headache.  Some fatigue.  States symptoms have resolved now.  Trying to stay in given covid restrictions.  No fever now.  Eating and drinking normally now.  No chest pain or sob.  No chest congestion.  No acid reflux.  No abdominal pain.  Bowels normal. No symptoms now.  Was questioning if could have been covid.  Has self quarantined.     ROS: See  pertinent positives and negatives per HPI.  Past Medical History:  Diagnosis Date  . Arthritis   . Chicken pox   . Cholecystitis 11/2011   Did not require sgy - Dr. Staci Acosta - [facility]  (cholelithiasis)  . H/O Clostridium difficile infection   . IBS (irritable bowel syndrome)   . MRSA exposure 2005   Spider bite  . MVP (mitral valve prolapse)    Stable - Dr. Ubaldo Glassing  . Rheumatic fever     Past Surgical History:  Procedure Laterality Date  . CATARACT EXTRACTION  [id number]  . MUSCLE BIOPSY      Family History  Problem Relation Age of Onset  . Arthritis Mother   . Stroke Mother   . Diabetes Mother   . Hypertension Mother   . Arthritis Father   . Stroke Father   . Diabetes Paternal Grandmother   . Cancer Paternal Uncle        colon  . Heart disease Other        maternal and paternal side  . Breast cancer Paternal Aunt   . Breast cancer Cousin   . Breast cancer Cousin        female cousin  . Breast cancer Cousin     SOCIAL HX: reviewed.    Current Outpatient Medications:  .  albuterol (PROVENTIL HFA;VENTOLIN HFA) 108 (90 Base) MCG/ACT inhaler, Inhale 2 puffs into the lungs every 6 (six) hours as needed for wheezing or shortness of breath., Disp: 1 Inhaler, Rfl: 0 .  amoxicillin-clavulanate (AUGMENTIN) 875-125 MG tablet, Take 1 tablet by mouth 2 (two) times daily., Disp: 20 tablet, Rfl: 0 .  ANUCORT-HC 25 MG suppository, UNW AND I 1 SUP REC BID, Disp: , Rfl: 1 .  aspirin 325 MG tablet, Take by mouth., Disp: , Rfl:  .  bisacodyl (DULCOLAX) 5 MG EC tablet, Take 5 mg by mouth daily as needed for moderate constipation., Disp: , Rfl:  .  fluticasone (FLONASE) 50 MCG/ACT nasal spray, SHAKE LIQUID AND USE 1 SPRAY IN EACH NOSTRIL TWICE DAILY, Disp: 16 g, Rfl: 0 .  Polyethyl Glycol-Propyl Glycol (SYSTANE OP), Apply to eye., Disp: , Rfl:  .  Probiotic Product (PROBIOTIC ADVANCED) CAPS, Take 1 capsule by mouth daily., Disp: 30 capsule, Rfl: 0 .  senna (SENOKOT) 8.6 MG TABS tablet, Take 1  tablet by mouth daily., Disp: , Rfl:  .  sodium chloride (OCEAN) 0.65 % SOLN nasal spray, Place 2 sprays into both nostrils every 2 (two) hours while awake., Disp: , Rfl: 0  EXAM:  GENERAL: alert, oriented, appears well and in no acute distress  HEENT: atraumatic, conjunttiva clear, no obvious abnormalities on inspection of external nose and ears  NECK: normal movements of the head and neck  LUNGS: on inspection no signs of respiratory distress, breathing rate appears normal, no obvious gross SOB, gasping or wheezing  CV: no obvious cyanosis  PSYCH/NEURO: pleasant and cooperative, no obvious depression or anxiety, speech and thought processing grossly intact  ASSESSMENT AND PLAN:  Discussed the following assessment and plan:  Asthma Breathing stable.  No sob.    Viral syndrome Symptoms as outlined.  Resolved now.  Initial symptoms started two weeks ago.  Has self quarantined.  No symptoms now.  Discussed covid testing.  Given no symptoms now and given start of symptoms two weeks ago, hold on covid testing.  Follow.      I discussed the assessment and treatment plan with the patient. The patient was provided an opportunity to ask questions and all were answered. The patient agreed with the plan and demonstrated an understanding of the instructions.   The patient was advised to call back or seek an in-person evaluation if the symptoms worsen or if the condition fails to improve as anticipated.  I provided 25 minutes of non-face-to-face time during this encounter.   Einar Pheasant, MD

## 2019-01-30 ENCOUNTER — Encounter: Payer: Self-pay | Admitting: Internal Medicine

## 2019-01-30 DIAGNOSIS — B349 Viral infection, unspecified: Secondary | ICD-10-CM | POA: Insufficient documentation

## 2019-01-30 NOTE — Assessment & Plan Note (Signed)
Breathing stable.  No sob.   

## 2019-01-30 NOTE — Assessment & Plan Note (Signed)
Symptoms as outlined.  Resolved now.  Initial symptoms started two weeks ago.  Has self quarantined.  No symptoms now.  Discussed covid testing.  Given no symptoms now and given start of symptoms two weeks ago, hold on covid testing.  Follow.

## 2019-02-18 ENCOUNTER — Ambulatory Visit: Payer: Self-pay | Admitting: Urology

## 2019-03-08 ENCOUNTER — Telehealth: Payer: Self-pay

## 2019-03-08 NOTE — Telephone Encounter (Signed)
Copied from [facility] [phone]. Topic: Medicare AWV >> Mar 08, 2019  4:18 PM Percell Belt A wrote: Reason for CRM: pt called in and would like to sch her AWV ,  best number [phone]

## 2019-03-10 NOTE — Telephone Encounter (Signed)
Spoke to pt in detail. She has some questions for her insurance company before scheduling. She will call me back

## 2019-03-11 DIAGNOSIS — M810 Age-related osteoporosis without current pathological fracture: Secondary | ICD-10-CM | POA: Diagnosis not present

## 2019-03-26 NOTE — Telephone Encounter (Signed)
Spoke to Lynn Mann again. She has decided to do a Welcome to [place] with Dr. Nicki Reaper. She can have this BEFORE 07/30/2019.  Could you please call Lynn Mann and schedule accordingly to Dr. Esau Grew preferences. C/B # [phone]  Thank you, Ebony Hail

## 2019-04-02 NOTE — Telephone Encounter (Signed)
Patient is calling because no one has notified her regarding an AWV as of yet.  She decided to do a welcome visit instead.  Please call patient to discuss.  CB# [phone]

## 2019-04-06 ENCOUNTER — Telehealth: Payer: Self-pay

## 2019-04-06 NOTE — Telephone Encounter (Signed)
Patient needs to do her welcome to medicare. Pt hung up before I could get her on the phone

## 2019-04-06 NOTE — Telephone Encounter (Signed)
Copied from [facility] [id number]. Topic: General - Inquiry >> Apr 06, 2019  4:28 PM Mathis Bud wrote: Reason for CRM: Patient is returning donnas call back. Call back [phone]

## 2019-04-06 NOTE — Telephone Encounter (Signed)
Pt returning call.  States she was told to call and schedule an AWV.  However there are no instructions on how or when to schedule that visit.  Pt would like a call back.

## 2019-04-06 NOTE — Telephone Encounter (Signed)
Left detailed message for pt 

## 2019-04-06 NOTE — Telephone Encounter (Signed)
Lynn Mann I think this was  To go to you and not donna.

## 2019-04-06 NOTE — Telephone Encounter (Signed)
This is about scheduling welcome to medicare

## 2019-04-07 ENCOUNTER — Other Ambulatory Visit: Payer: Self-pay | Admitting: Internal Medicine

## 2019-04-07 DIAGNOSIS — Z1231 Encounter for screening mammogram for malignant neoplasm of breast: Secondary | ICD-10-CM

## 2019-04-07 NOTE — Telephone Encounter (Signed)
Scheduled pt

## 2019-04-21 ENCOUNTER — Ambulatory Visit: Payer: Self-pay | Admitting: Urology

## 2019-04-28 ENCOUNTER — Encounter: Payer: Self-pay | Admitting: Urology

## 2019-04-28 ENCOUNTER — Ambulatory Visit: Payer: PPO | Admitting: Urology

## 2019-04-28 ENCOUNTER — Other Ambulatory Visit: Payer: Self-pay

## 2019-04-28 VITALS — BP 125/70 | HR 72 | Ht 67.0 in | Wt 157.0 lb

## 2019-04-28 DIAGNOSIS — N3941 Urge incontinence: Secondary | ICD-10-CM | POA: Diagnosis not present

## 2019-04-28 DIAGNOSIS — R351 Nocturia: Secondary | ICD-10-CM

## 2019-04-28 LAB — URINALYSIS, COMPLETE
Bilirubin, UA: NEGATIVE
Glucose, UA: NEGATIVE
Leukocytes,UA: NEGATIVE
Nitrite, UA: NEGATIVE
Protein,UA: NEGATIVE
RBC, UA: NEGATIVE
Specific Gravity, UA: 1.025 (ref 1.005–1.030)
Urobilinogen, Ur: 0.2 mg/dL (ref 0.2–1.0)
pH, UA: 5 (ref 5.0–7.5)

## 2019-04-28 LAB — MICROSCOPIC EXAMINATION
Bacteria, UA: NONE SEEN
Epithelial Cells (non renal): NONE SEEN /hpf (ref 0–10)
RBC: NONE SEEN /hpf (ref 0–2)

## 2019-04-28 LAB — BLADDER SCAN AMB NON-IMAGING: Scan Result: 0

## 2019-04-28 NOTE — Progress Notes (Signed)
04/28/19 1:44 PM   Lynn Mann 03-Dec-1950 [id number]  Referring provider: Einar Pheasant, MD [address] [place] [id number] [place],  [place] [id number]  CC: Urge incontinence  HPI: I saw Lynn Mann in urology clinic in consultation from Dr. Nicki Reaper for urgent continence.  She is a relatively healthy 68 year old female the reports 2.5 years of occasional severe urinary urge incontinence.  She denies any stress incontinence.  Her urge incontinence will come out of the blue when she gets out of the car and starts to walk into the house, and she will leak a large volume of fluid that goes through a depends.  Interestingly, she can do well during the entire day, but then have severe leakage when walking in the house before making it to the bathroom with urgency.  She has previously tried physical therapy which did significantly improve her symptoms, however she does not want to see the physical therapist during the [place] pandemic.  She has never tried any medications for this.  She has had problems with severe constipation and fecal impaction in the past, and is very wary of starting any new medications that could potentially worsen her constipation.  In general, she wants to avoid medications unless absolutely necessary.  She does drink tea during the day.  She does have some symptoms occasionally of the urgency and urge incontinence overnight as well.  She denies any gross hematuria, flank pain, or recurrent UTIs.   PMH: Past Medical History:  Diagnosis Date  . Arthritis   . Chicken pox   . Cholecystitis 11/2011   Did not require sgy - Dr. Staci Acosta - [facility]  (cholelithiasis)  . H/O Clostridium difficile infection   . IBS (irritable bowel syndrome)   . MRSA exposure 2005   Spider bite  . MVP (mitral valve prolapse)    Stable - Dr. Ubaldo Glassing  . Rheumatic fever     Surgical History: Past Surgical History:  Procedure Laterality Date  . CATARACT EXTRACTION  [id number]  . MUSCLE BIOPSY       Allergies:  Allergies  Allergen Reactions  . Other Anaphylaxis    Artificial sweetener  . Adhesive [Tape]   . Tartrazine   . Yellow Dyes (Non-Tartrazine) Other (See Comments)    Arthritis pains - it is tartrazine Yellow #5    Family History: Family History  Problem Relation Age of Onset  . Arthritis Mother   . Stroke Mother   . Diabetes Mother   . Hypertension Mother   . Arthritis Father   . Stroke Father   . Diabetes Paternal Grandmother   . Cancer Paternal Uncle        colon  . Heart disease Other        maternal and paternal side  . Breast cancer Paternal Aunt   . Breast cancer Cousin   . Breast cancer Cousin        female cousin  . Breast cancer Cousin     Social History:  reports that she has never smoked. She has never used smokeless tobacco. She reports current alcohol use. She reports that she does not use drugs.  ROS: Please see flowsheet from today's date for complete review of systems.  Physical Exam: BP 125/70   Pulse 72   Ht 5\' 7"  (1.702 m)   Wt 157 lb (71.2 kg)   BMI 24.59 kg/m    Constitutional:  Alert and oriented, No acute distress. Cardiovascular: No clubbing, cyanosis, or edema. Respiratory: Normal respiratory effort,  no increased work of breathing. GI: Abdomen is soft, nontender, nondistended, no abdominal masses GU: No CVA tenderness Lymph: No cervical or inguinal lymphadenopathy. Skin: No rashes, bruises or suspicious lesions. Neurologic: Grossly intact, no focal deficits, moving all 4 extremities. Psychiatric: Normal mood and affect.  Laboratory Data: Urinalysis today 0-5 WBCs, 0 RBCs, no bacteria, nitrite negative   Assessment & Plan:   In summary, the patient is a 68 year old healthy female with overactive bladder and urge incontinence.  We discussed that overactive bladder (OAB) is not a disease, but is a symptom complex that is generally not life-threatening.  Symptoms typically include urinary urgency, frequency, and urge  incontinence.  There are numerous treatment options, however there are risks and benefits with both medical and surgical management.  First-line treatment is behavioral therapies including bladder training, pelvic floor muscle training, and fluid management.  Second line treatments include oral antimuscarinics(Ditropan er, Trospium) and beta-3 agonist (Mybetriq). There is typically a period of medication trial (4-8 weeks) to find the optimal therapy and dosing. If symptoms are bothersome despite the above management, third line options include intra-detrusor botox, peripheral tibial nerve stimulation (PTNS), and interstim (SNS). These are more invasive treatments with higher side effect profile, but may improve quality of life for patients with severe OAB symptoms.   Trial of PTNS for OAB when patient comfortable regarding COVID  RTC 3 months phone follow-up   Billey Co, [place] [address], [place], [place] [address] [phone]

## 2019-04-28 NOTE — Patient Instructions (Signed)

## 2019-04-30 ENCOUNTER — Telehealth: Payer: Self-pay | Admitting: Urology

## 2019-04-30 NOTE — Telephone Encounter (Signed)
PTNS HAS BEEN APPROVED [id number] VALID FOR 04-29-19 THRU 07-29-19 I called the patient to schedule per her request and she now does not want to schedule until after COVID is over. I explained that the PA is only valid until the 31st. She said she has quarantined herself.  Sharyn Lull

## 2019-05-31 ENCOUNTER — Ambulatory Visit
Admission: RE | Admit: 2019-05-31 | Discharge: 2019-05-31 | Disposition: A | Discharge status: Home / Self Care | Payer: PPO | Source: Ambulatory Visit | Attending: Internal Medicine | Admitting: Internal Medicine

## 2019-05-31 DIAGNOSIS — Z1231 Encounter for screening mammogram for malignant neoplasm of breast: Secondary | ICD-10-CM | POA: Insufficient documentation

## 2019-05-31 IMAGING — MG DIGITAL SCREENING BILAT W/ TOMO
8 series · 8 of 24 positions shown · non-contrast
Comparison: Previous exam(s).

CLINICAL DATA: Screening.

EXAM:
DIGITAL SCREENING BILATERAL MAMMOGRAM WITH TOMO AND CAD

[L CC synth-2D]
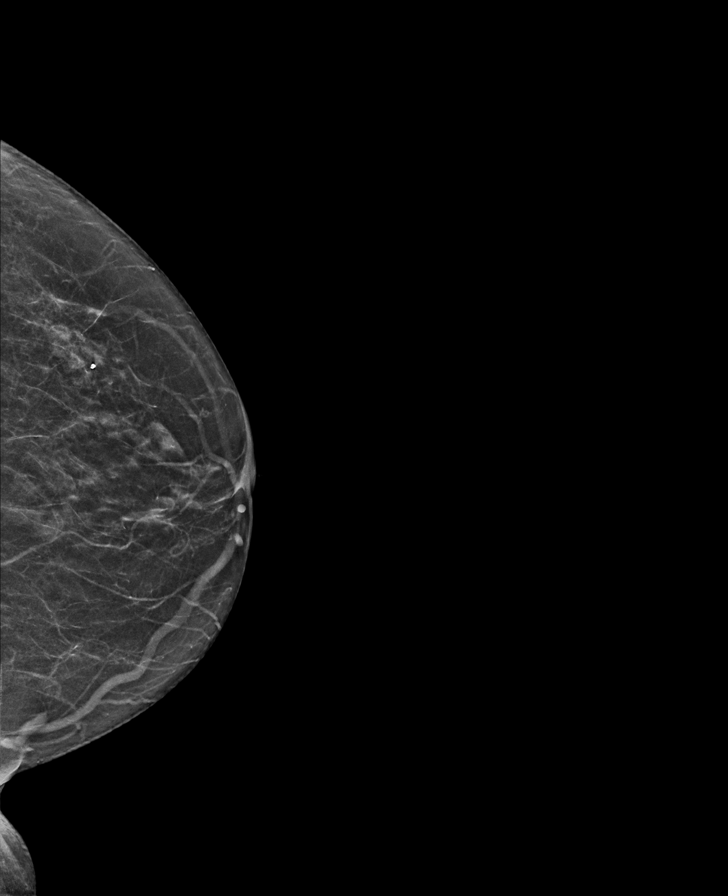

[R CC synth-2D]
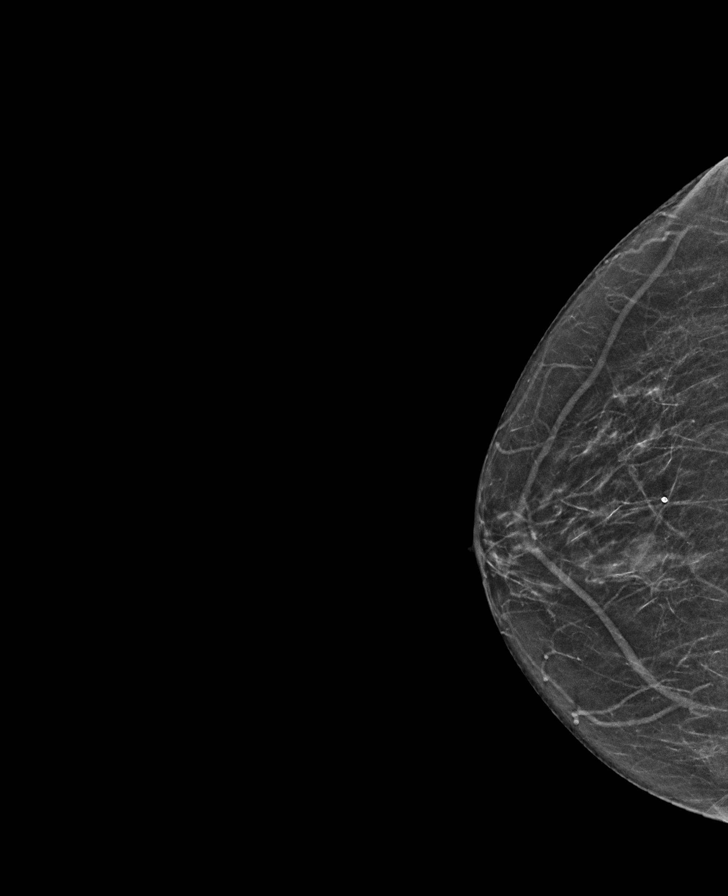

[L MLO synth-2D]
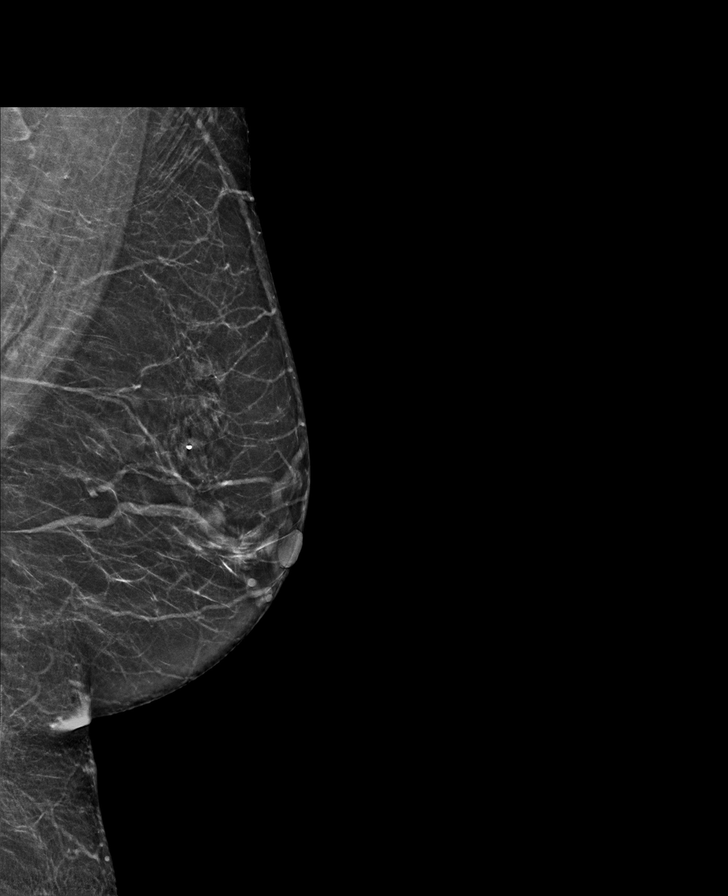

[R MLO synth-2D]
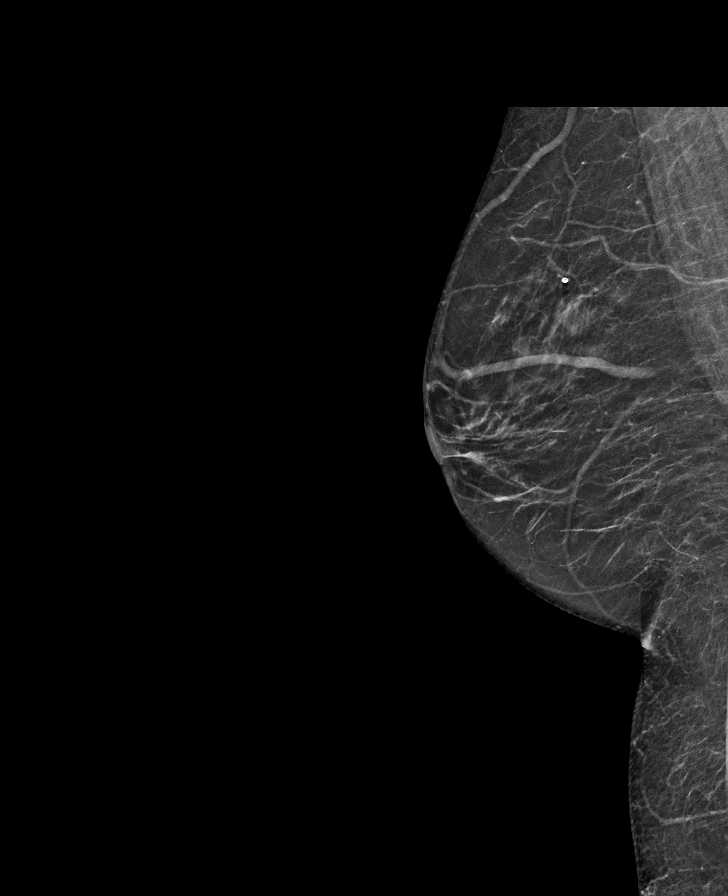

[L CC tomo · tomo slice 27/52.0]
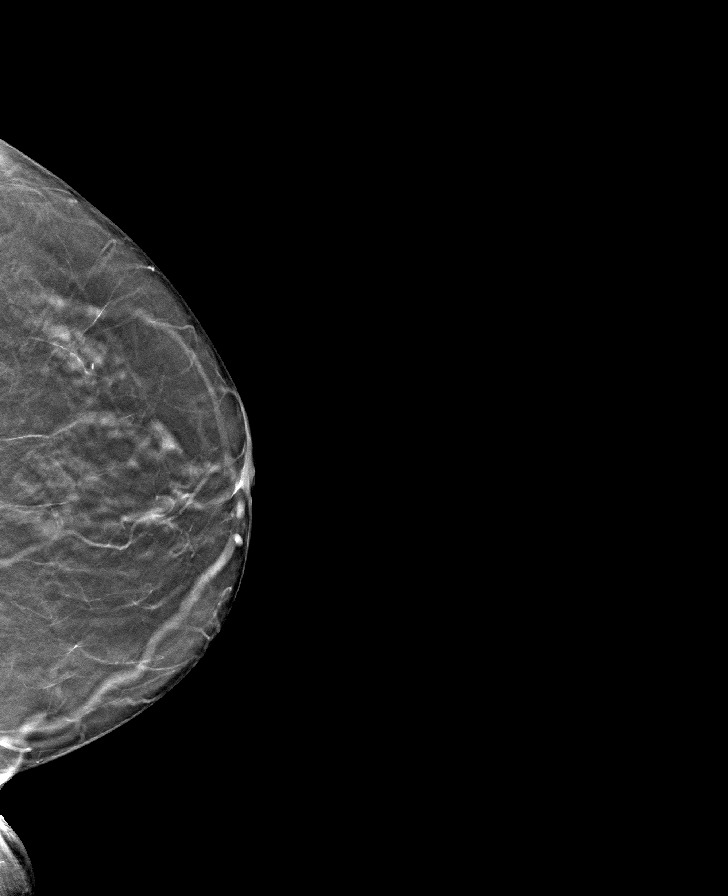

[R MLO tomo · tomo slice 29/56.0]
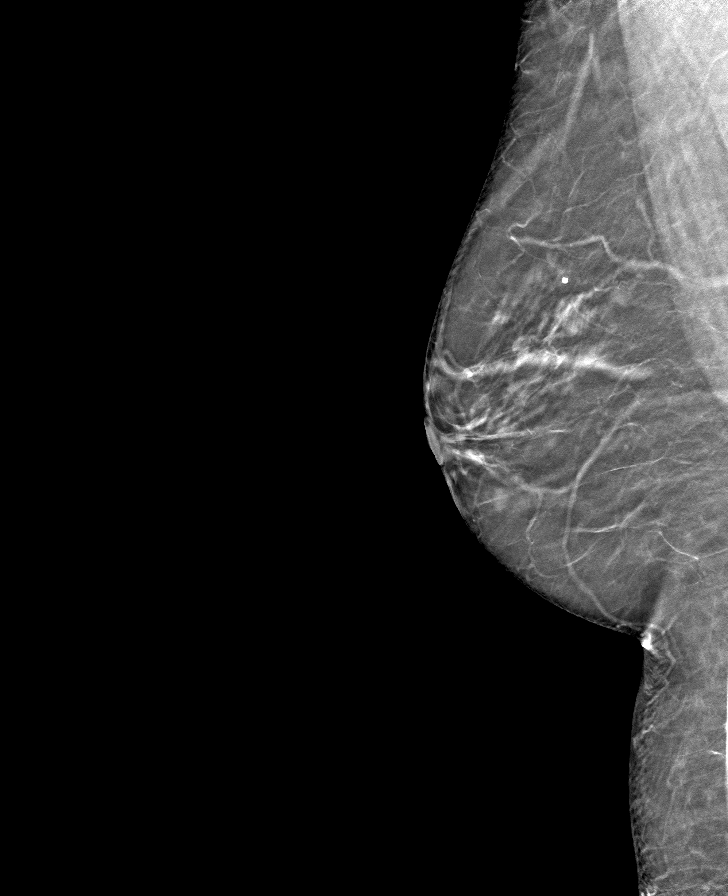

[R CC tomo · tomo slice 27/52.0]
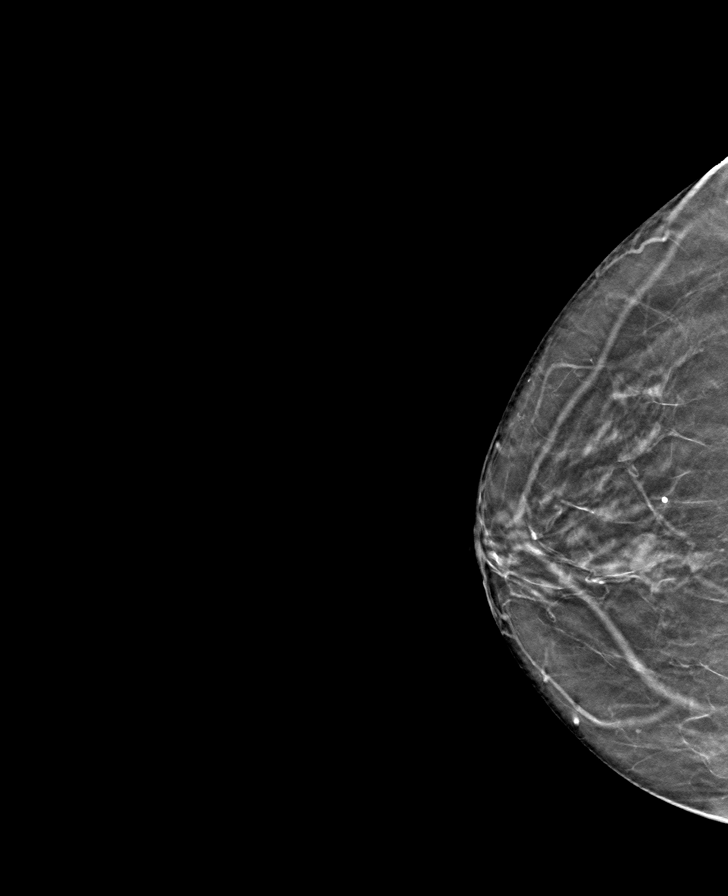

[L MLO tomo · tomo slice 31/60.0]
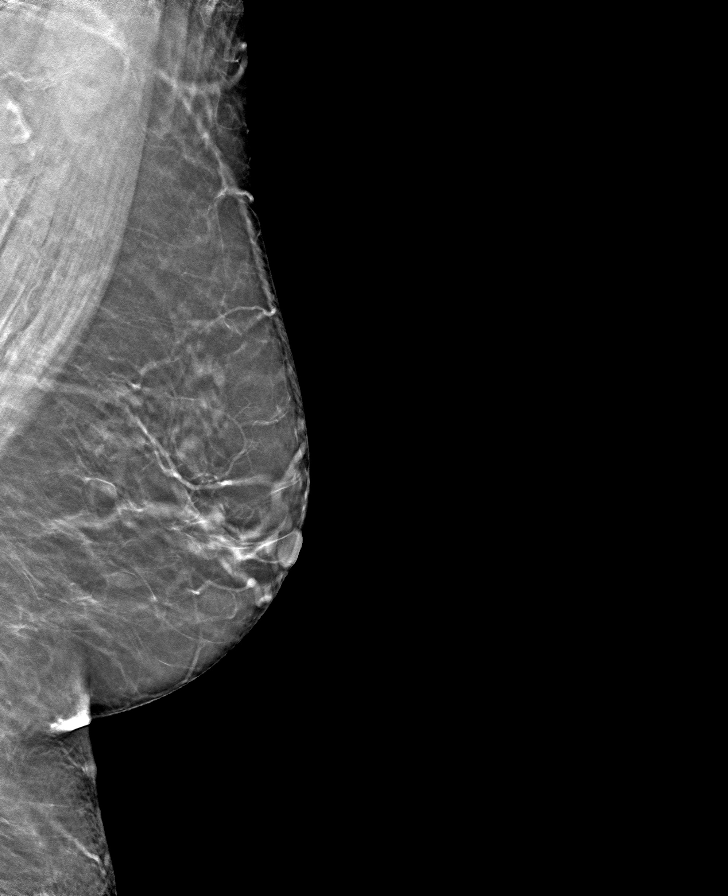

[8 of 24 positions shown; findings below may reference images not displayed]

ACR Breast Density Category b: There are scattered areas of
fibroglandular density.
FINDINGS: There are no findings suspicious for malignancy. Images were
processed with CAD.
IMPRESSION: No mammographic evidence of malignancy. A result letter of this
screening mammogram will be mailed directly to the patient.

RECOMMENDATION:
Screening mammogram in one year. (Code:[TQ])

BI-RADS CATEGORY  1: Negative.

## 2019-06-14 ENCOUNTER — Telehealth: Payer: Self-pay | Admitting: Internal Medicine

## 2019-06-14 NOTE — Telephone Encounter (Signed)
Patient is calling to ask about her welcome to medicare visit on Thursday. Patient is had a bone density.  Patient has to schedule some PT and nerve treatment plan.   She does not want to be charged additional fees if she reveals to much information. Please advise.  CB- [phone]

## 2019-06-15 ENCOUNTER — Other Ambulatory Visit: Payer: Self-pay

## 2019-06-16 NOTE — Telephone Encounter (Signed)
Patient is trying to decide if she wants to come into the office or not. She has been screened. Mentioned doing welcome to medicare virtually- unsure if she wants that. Pt has been quarantining and is unsure about coming into office. Advised her to just let me know.

## 2019-06-16 NOTE — Telephone Encounter (Signed)
Called patient for clarification. Stated she was wanting to know if tomorrows visit was a welcome to medicare or wellness visit. Explained that it is a welcome to medicare visit with Dr Nicki Reaper. Call was disconnected.

## 2019-06-17 ENCOUNTER — Other Ambulatory Visit: Payer: Self-pay

## 2019-06-17 ENCOUNTER — Ambulatory Visit (INDEPENDENT_AMBULATORY_CARE_PROVIDER_SITE_OTHER): Payer: PPO | Admitting: Internal Medicine

## 2019-06-17 VITALS — BP 126/70 | HR 76 | Temp 96.9°F | Resp 16 | Ht 66.5 in | Wt 164.2 lb

## 2019-06-17 DIAGNOSIS — Z0000 Encounter for general adult medical examination without abnormal findings: Secondary | ICD-10-CM

## 2019-06-17 DIAGNOSIS — Z136 Encounter for screening for cardiovascular disorders: Secondary | ICD-10-CM

## 2019-06-17 DIAGNOSIS — Z20828 Contact with and (suspected) exposure to other viral communicable diseases: Secondary | ICD-10-CM | POA: Diagnosis not present

## 2019-06-17 DIAGNOSIS — E7800 Pure hypercholesterolemia, unspecified: Secondary | ICD-10-CM

## 2019-06-17 DIAGNOSIS — Z20822 Contact with and (suspected) exposure to covid-19: Secondary | ICD-10-CM

## 2019-06-17 LAB — LIPID PANEL
Cholesterol: 226 mg/dL — ABNORMAL HIGH (ref 0–200)
HDL: 52.7 mg/dL (ref >39.00)
LDL Cholesterol: 147 mg/dL — ABNORMAL HIGH (ref 0–99)
NonHDL: 172.82
Total CHOL/HDL Ratio: 4
Triglycerides: 129 mg/dL (ref 0.0–149.0)
VLDL: 25.8 mg/dL (ref 0.0–40.0)

## 2019-06-17 LAB — COMPREHENSIVE METABOLIC PANEL
ALT: 15 U/L (ref 0–35)
AST: 20 U/L (ref 0–37)
Albumin: 4.7 g/dL (ref 3.5–5.2)
Alkaline Phosphatase: 60 U/L (ref 39–117)
BUN: 21 mg/dL (ref 6–23)
CO2: 27 mEq/L (ref 19–32)
Calcium: 9.3 mg/dL (ref 8.4–10.5)
Chloride: 102 mEq/L (ref 96–112)
Creatinine, Ser: 0.64 mg/dL (ref 0.40–1.20)
GFR: 92.63 mL/min (ref >60.00)
Glucose, Bld: 78 mg/dL (ref 70–99)
Potassium: 4 mEq/L (ref 3.5–5.1)
Sodium: 138 mEq/L (ref 135–145)
Total Bilirubin: 1.1 mg/dL (ref 0.2–1.2)
Total Protein: 7.3 g/dL (ref 6.0–8.3)

## 2019-06-17 LAB — CBC WITH DIFFERENTIAL/PLATELET
Basophils Absolute: 0 10*3/uL (ref 0.0–0.1)
Basophils Relative: 0.4 % (ref 0.0–3.0)
Eosinophils Absolute: 0 10*3/uL (ref 0.0–0.7)
Eosinophils Relative: 0.3 % (ref 0.0–5.0)
HCT: 39.5 % (ref 36.0–46.0)
Hemoglobin: 13.1 g/dL (ref 12.0–15.0)
Lymphocytes Relative: 27.3 % (ref 12.0–46.0)
Lymphs Abs: 1.6 10*3/uL (ref 0.7–4.0)
MCHC: 33.1 g/dL (ref 30.0–36.0)
MCV: 89.5 fl (ref 78.0–100.0)
Monocytes Absolute: 0.4 10*3/uL (ref 0.1–1.0)
Monocytes Relative: 6.4 % (ref 3.0–12.0)
Neutro Abs: 3.9 10*3/uL (ref 1.4–7.7)
Neutrophils Relative %: 65.6 % (ref 43.0–77.0)
Platelets: 262 10*3/uL (ref 150.0–400.0)
RBC: 4.42 Mil/uL (ref 3.87–5.11)
RDW: 13.2 % (ref 11.5–15.5)
WBC: 6 10*3/uL (ref 4.0–10.5)

## 2019-06-17 LAB — TSH: TSH: 3.37 u[IU]/mL (ref 0.35–4.50)

## 2019-06-17 NOTE — Progress Notes (Signed)
Patient ID: Lynn Mann, female   DOB: 1950/12/27, 68 y.o.   MRN: [id number]  This visit occurred during the SARS-CoV-2 public health emergency.  Safety protocols were in place, including screening questions prior to the visit, additional usage of staff PPE, and extensive cleaning of exam room while observing appropriate contact time as indicated for disinfecting solutions.  The patient is here for Welcome to Medicare wellness examination and management of other chronic and acute problems.   The risk factors are reflected in the social history.  The roster of all physicians providing medical care to patient - is listed:  Dr Sinisky(urology), Dr Ladell Pier (endocrinology) and Dr Leafy Ro (GYN).   Activities of daily living:  The patient is 100% independent in all ADLs: dressing, toileting, feeding as well as independent mobility  Home safety : The patient has smoke detectors in the home. They wear seatbelts.  There is no violence in the home.   There is no risks for hepatitis, STDs or HIV. There is no history of blood transfusion. They have no travel history to infectious disease endemic areas of the world.  The patient has seen their dentist in the last six month. She has seen her eye doctor in the last year. she admits to slight hearing difficulty- has hearing aids.   She does not  have excessive sun exposure.  Diet: the importance of a healthy diet is discussed.   The benefits of regular aerobic exercise were discussed.   Depression screen: there are no signs or vegative symptoms of depression- irritability, change in appetite, anhedonia, sadness/tearfullness.  Cognitive assessment: the patient manages all her financial and personal affairs and is actively engaged. She could relate day,date,year and events; recalled 2/3 objects at 3 minutes.    The following portions of the patient's history were reviewed and updated as appropriate: allergies, current medications, past family history, past  medical history,  past surgical history, past social history  and problem list.  Visual acuity was assessed and recorded per nurse.  Hearing and body mass index were assessed and reviewed.   During the course of the visit the patient was educated and counseled about appropriate screening and preventive services including : fall prevention , diabetes screening, nutrition counseling, colorectal cancer screening, and recommended immunizations.    EKG :  SR with no acute ischemic changes.    Physical Exam:   General:  Well appearing in no acute distress.   HEENT:  Nares clear.  OP - without lesions.   NECK:  Supple. Non tender.   HEART:  Regular rate and rhythm.   LUNGS:  Clear.  ABDOMEN:  Bowel sounds present. Non tender.   BREASTS:  No nipple discharge or nipple retraction present.  Could not appreciate any distinct nodules or axillary adenopathy present.   EXTREMITY:  No increased edema   Discussed advance directives.  She does not have a living will and desires not to have one at this time.  Discussed health care power of attorney and legal power of attorney.     Health Maintenance Due:  -Influenza vaccine 2020- she declines.  .    -PNA - discussed.  She declines.    -Tdap- discussed.  She declines.    -Dexa Scan-  Had previously at [facility]  Wants to hold at this time.   -Mammogram- 06/01/19 -Eye Exam- up to date.   Colonoscopy - reports had last year.  Will obtain copy of report.   Hearing: Demonstrates normal hearing during visit.  Decreased with whispered voice (with mask on).   Hearing aids- has hearing aids.  Sees audiology.    Safety:  Patient feels safe at home- yes Patient does have smoke detectors at home- yes Patient does wear sunscreen or protective clothing when in direct sunlight - yes Patient does wear seat belt when in a moving vehicle - yes Patient drives- yes Ambulates without assistance.   Social:     Smoking history- never smoker Illicit drug  use? none  Depression: PHQ 2 &9 complete. See screening below. Denies irritability, anhedonia, sadness/tearfullness.  Stable.   Falls: See screening below.    Medication: Taking as directed and without issues.   Covid-19: Precautions and sickness symptoms discussed. Wears mask, social distancing, hand hygiene as appropriate.   Activities of Daily Living Patient denies needing assistance with: household chores, feeding themselves, getting from bed to chair, getting to the toilet, bathing/showering, dressing, managing money, or preparing meals.   Memory: Patient is alert. Patient denies difficulty focusing or concentrating. Correctly identified the president of the [place], season and recall 2/3 objects.   BMI- discussed the importance of a healthy diet, water intake and the benefits of aerobic exercise.  Educational material provided.   Diet:  Regular diet. Discussed diet and exercise.    Other Providers Patient Care Team: Einar Pheasant, MD as PCP - General (Internal Medicine)  Dr Leafy Ro (gyn) Dr Claudie Leach (urology) Dr OConnell(endocrinology)   This is a list of the screening recommended for you and due dates:  Health Maintenance  Topic Date Due  . DEXA scan (bone density measurement)  09/12/2017  . Mammogram  05/30/2021  . Colon Cancer Screening  05/06/2023  . Tetanus Vaccine  07/24/2025  .  Hepatitis C: One time screening is recommended by Center for Disease Control  (CDC) for  adults born from [birth year] through 1963.   Completed  . Flu Shot  Discontinued  . Pneumonia vaccines  Discontinued   Medicare Attestation I have personally reviewed: The patient's medical and social history Their use of alcohol, tobacco or illicit drugs Their current medications and supplements The patient's functional ability including ADLs,fall risks, home safety risks, cognitive, and hearing and visual impairment Diet and physical activities Evidence for depression - none

## 2019-06-18 ENCOUNTER — Other Ambulatory Visit: Payer: PPO

## 2019-06-18 ENCOUNTER — Other Ambulatory Visit: Payer: Self-pay

## 2019-06-18 ENCOUNTER — Telehealth: Payer: Self-pay | Admitting: Internal Medicine

## 2019-06-18 DIAGNOSIS — Z20828 Contact with and (suspected) exposure to other viral communicable diseases: Secondary | ICD-10-CM

## 2019-06-18 DIAGNOSIS — Z20822 Contact with and (suspected) exposure to covid-19: Secondary | ICD-10-CM

## 2019-06-18 LAB — SARS-COV-2 IGG: SARS-COV-2 IgG: 0.02

## 2019-06-18 NOTE — Telephone Encounter (Signed)
Patient called to say that she is taking something for her cough and drinking hot tea. Say that she have a dry cough and also she have been searching some old boxes. States that she does  Not think its anything serious but just weird that she is coughing. Please call patient at Ph# [phone]) [phone]

## 2019-06-18 NOTE — Telephone Encounter (Signed)
Patient is calling to report that she has a bad cough and was in the office yesterday. She wanted to let Thrisha and Dr. Nicki Reaper know. She states it is probably from Dust. She has been cleaning boxes. She just wanted to let office know.

## 2019-06-18 NOTE — Telephone Encounter (Signed)
LMTCB to confirm that patient is having no other symptoms.

## 2019-06-18 NOTE — Telephone Encounter (Signed)
Patient called again to add that she is very tired and she also had an antibody test as well.  Please advise.

## 2019-06-18 NOTE — Telephone Encounter (Signed)
Please call pt and confirm no other sympotms.  No fever.  No sob.  Is she taking anything for cough?  If needs something can take robitussin DM.  Let me know if any other symptoms.

## 2019-06-19 ENCOUNTER — Encounter: Payer: Self-pay | Admitting: Internal Medicine

## 2019-06-19 NOTE — Telephone Encounter (Signed)
See my chart message

## 2019-06-20 ENCOUNTER — Encounter: Payer: Self-pay | Admitting: Internal Medicine

## 2019-06-20 DIAGNOSIS — Z0000 Encounter for general adult medical examination without abnormal findings: Secondary | ICD-10-CM | POA: Insufficient documentation

## 2019-06-20 NOTE — Assessment & Plan Note (Signed)
See note for review.

## 2019-08-12 ENCOUNTER — Other Ambulatory Visit: Payer: Self-pay

## 2019-08-12 ENCOUNTER — Telehealth: Payer: PPO | Admitting: Urology

## 2019-12-02 ENCOUNTER — Telehealth (INDEPENDENT_AMBULATORY_CARE_PROVIDER_SITE_OTHER): Payer: PPO | Admitting: Internal Medicine

## 2019-12-02 ENCOUNTER — Encounter: Payer: Self-pay | Admitting: Internal Medicine

## 2019-12-02 VITALS — Ht 66.5 in | Wt 164.2 lb

## 2019-12-02 DIAGNOSIS — J329 Chronic sinusitis, unspecified: Secondary | ICD-10-CM

## 2019-12-02 DIAGNOSIS — R1084 Generalized abdominal pain: Secondary | ICD-10-CM

## 2019-12-02 DIAGNOSIS — J3489 Other specified disorders of nose and nasal sinuses: Secondary | ICD-10-CM | POA: Diagnosis not present

## 2019-12-02 MED ORDER — SALINE SPRAY 0.65 % NA SOLN: 1.0000 | NASAL | 11 refills | Status: DC | PRN

## 2019-12-02 MED ORDER — MUPIROCIN 2 % EX OINT: 1.0000 "application " | TOPICAL_OINTMENT | Freq: Two times a day (BID) | CUTANEOUS | 0 refills | Status: DC

## 2019-12-02 NOTE — Progress Notes (Signed)
telephone Note  I connected with Lynn Mann  on 12/02/19 at  3:30 PM EDT by telephone and verified that I am speaking with the correct person using two identifiers.  Location patient: home Location provider:work or home office Persons participating in the virtual visit: patient, provider  I discussed the limitations of evaluation and management by telemedicine and the availability of in person appointments. The patient expressed understanding and agreed to proceed.   HPI: F/u 1. Left nasal brown discharge after covid test today on [facility]. She went to funeral last weekend with a lot of people and did not have her mask  covid 19 11/22/19 negative and 10/31/19 negative   Denies fever though normal temp 55F and temp been 98.8 and 98.9 No sinus pressure  She did have dry cough 2 weeks ago with 2.5 weeks ago had some phelgm and used mucinex dm   2. Ab pain x 2 weeks diffuse and mid abdomen and intermittent at times 5/10 no constipation/diarrhea h/o diarrhea, denies blood  Disc if needed f/u [facility] as she is established last colonoscopy 10/15/2018 diverticulosis and tubular adenoma f/u in 5 years  Nothing tried   ROS: See pertinent positives and negatives per HPI.  Past Medical History:  Diagnosis Date  . Arthritis   . C. difficile diarrhea    age 56s-40s   . Chicken pox   . Cholecystitis 11/2011   Did not require sgy - Dr. Staci Acosta - [facility]  (cholelithiasis)  . H/O Clostridium difficile infection   . IBS (irritable bowel syndrome)   . MRSA exposure 2005   Spider bite  . MVP (mitral valve prolapse)    Stable - Dr. Ubaldo Glassing  . Rheumatic fever     Past Surgical History:  Procedure Laterality Date  . CATARACT EXTRACTION  [id number]  . MUSCLE BIOPSY      Family History  Problem Relation Age of Onset  . Arthritis Mother   . Stroke Mother   . Diabetes Mother   . Hypertension Mother   . Arthritis Father   . Stroke Father   . Diabetes Paternal Grandmother   . Cancer Paternal Uncle         colon  . Heart disease Other        maternal and paternal side  . Breast cancer Paternal Aunt   . Breast cancer Cousin   . Breast cancer Cousin        female cousin  . Breast cancer Cousin     SOCIAL HX: lives at home   Current Outpatient Medications:  .  Ascorbic Acid (VITAMIN C PO), Take by mouth., Disp: , Rfl:  .  CALCIUM PO, Take by mouth., Disp: , Rfl:  .  cholecalciferol (VITAMIN D3) 25 MCG (1000 UT) tablet, Take 1,000 Units by mouth once a week., Disp: , Rfl:  .  Multiple Vitamins-Minerals (PRESERVISION AREDS PO), Take by mouth., Disp: , Rfl:  .  multivitamin-iron-minerals-folic acid (CENTRUM) chewable tablet, Chew 1 tablet by mouth daily., Disp: , Rfl:  .  Polyethyl Glycol-Propyl Glycol (SYSTANE OP), Apply to eye., Disp: , Rfl:  .  albuterol (PROVENTIL HFA;VENTOLIN HFA) 108 (90 Base) MCG/ACT inhaler, Inhale 2 puffs into the lungs every 6 (six) hours as needed for wheezing or shortness of breath. (Patient not taking: Reported on 12/02/2019), Disp: 1 Inhaler, Rfl: 0 .  ANUCORT-HC 25 MG suppository, UNW AND I 1 SUP REC BID, Disp: , Rfl: 1 .  aspirin 325 MG tablet, Take by mouth.,  Disp: , Rfl:  .  bisacodyl (DULCOLAX) 5 MG EC tablet, Take 5 mg by mouth daily as needed for moderate constipation., Disp: , Rfl:  .  fluticasone (FLONASE) 50 MCG/ACT nasal spray, SHAKE LIQUID AND USE 1 SPRAY IN EACH NOSTRIL TWICE DAILY (Patient not taking: Reported on 12/02/2019), Disp: 16 g, Rfl: 0 .  mupirocin ointment (BACTROBAN) 2 %, Place 1 application into the nose 2 (two) times daily. X 5 days, Disp: 30 g, Rfl: 0 .  Probiotic Product (PROBIOTIC ADVANCED) CAPS, Take 1 capsule by mouth daily. (Patient not taking: Reported on 12/02/2019), Disp: 30 capsule, Rfl: 0 .  senna (SENOKOT) 8.6 MG TABS tablet, Take 1 tablet by mouth daily., Disp: , Rfl:  .  sodium chloride (OCEAN) 0.65 % SOLN nasal spray, Place 1 spray into both nostrils as needed for congestion., Disp: 30 mL, Rfl: 11  EXAM:  VITALS per patient  if applicable:  GENERAL: alert, oriented, appears well and in no acute distress  HEENT: atraumatic, conjunttiva clear, no obvious abnormalities on inspection of external nose and ears  NECK: normal movements of the head and neck  LUNGS: on inspection no signs of respiratory distress, breathing rate appears normal, no obvious gross SOB, gasping or wheezing  CV: no obvious cyanosis  MS: moves all visible extremities without noticeable abnormality  PSYCH/NEURO: pleasant and cooperative, no obvious depression or anxiety, speech and thought processing grossly intact  ASSESSMENT AND PLAN:  Discussed the following assessment and plan:  Nasal discharge brown likely blood could be 2/2 trauma prior covid test 11/22/19 negative, nasal sore she does have h/o MRSA - Plan: mupirocin ointment (BACTROBAN) 2 % bid x 5 days, sodium chloride (OCEAN) 0.65 % SOLN nasal spray Less likely sinusitis no sxs will hold on oral antibiotics considered doxycycline but never had this and h/o yellow dye allergy which could cross react, h/o c diff as well  Considered bactrim as well if not better in 1 week call back in the future   Generalized abdominal pain No clear etiology denies constipation/diarrhea/blood in stool  Prior CT in 2014 pelvic congestion concerned consider ob/gyn for TVUS/pelvis US in future  H/o tubular adenoma/diverticulosis  If pain continues consider pt to call [facility] and get appt or call us back for referral   -we discussed possible serious and likely etiologies, options for evaluation and workup, limitations of telemedicine visit vs in person visit, treatment, treatment risks and precautions. Pt prefers to treat via telemedicine empirically rather then risking or undertaking an in person visit at this moment. Patient agrees to seek prompt in person care if worsening, new symptoms arise, or if is not improving with treatment.   I discussed the assessment and treatment plan with the patient. The  patient was provided an opportunity to ask questions and all were answered. The patient agreed with the plan and demonstrated an understanding of the instructions.   The patient was advised to call back or seek an in-person evaluation if the symptoms worsen or if the condition fails to improve as anticipated.  Time spent 20 minutes  Delorise Jackson, MD

## 2020-03-06 ENCOUNTER — Ambulatory Visit
Admission: EM | Admit: 2020-03-06 | Discharge: 2020-03-06 | Disposition: A | Discharge status: Home / Self Care | Payer: PPO | Attending: Family Medicine | Admitting: Family Medicine

## 2020-03-06 ENCOUNTER — Other Ambulatory Visit: Payer: Self-pay

## 2020-03-06 ENCOUNTER — Ambulatory Visit (INDEPENDENT_AMBULATORY_CARE_PROVIDER_SITE_OTHER): Payer: PPO

## 2020-03-06 DIAGNOSIS — M4316 Spondylolisthesis, lumbar region: Secondary | ICD-10-CM | POA: Diagnosis not present

## 2020-03-06 DIAGNOSIS — M7918 Myalgia, other site: Secondary | ICD-10-CM

## 2020-03-06 DIAGNOSIS — W19XXXA Unspecified fall, initial encounter: Secondary | ICD-10-CM

## 2020-03-06 DIAGNOSIS — S8991XA Unspecified injury of right lower leg, initial encounter: Secondary | ICD-10-CM | POA: Diagnosis not present

## 2020-03-06 DIAGNOSIS — S3992XA Unspecified injury of lower back, initial encounter: Secondary | ICD-10-CM | POA: Diagnosis not present

## 2020-03-06 DIAGNOSIS — S4991XA Unspecified injury of right shoulder and upper arm, initial encounter: Secondary | ICD-10-CM | POA: Diagnosis not present

## 2020-03-06 DIAGNOSIS — S79911A Unspecified injury of right hip, initial encounter: Secondary | ICD-10-CM | POA: Diagnosis not present

## 2020-03-06 DIAGNOSIS — R222 Localized swelling, mass and lump, trunk: Secondary | ICD-10-CM | POA: Diagnosis not present

## 2020-03-06 DIAGNOSIS — M47816 Spondylosis without myelopathy or radiculopathy, lumbar region: Secondary | ICD-10-CM | POA: Diagnosis not present

## 2020-03-06 DIAGNOSIS — I700 Atherosclerosis of aorta: Secondary | ICD-10-CM | POA: Diagnosis not present

## 2020-03-06 IMAGING — CR DG CERVICAL SPINE COMPLETE 4+V
6 series · 6 of 6 positions shown · non-contrast
Comparison: Cervical spine radiograph dated [DATE].

CLINICAL DATA: 69-year-old female with fall.

EXAM:
CERVICAL SPINE - COMPLETE 4+ VIEW

[c-spine lat]
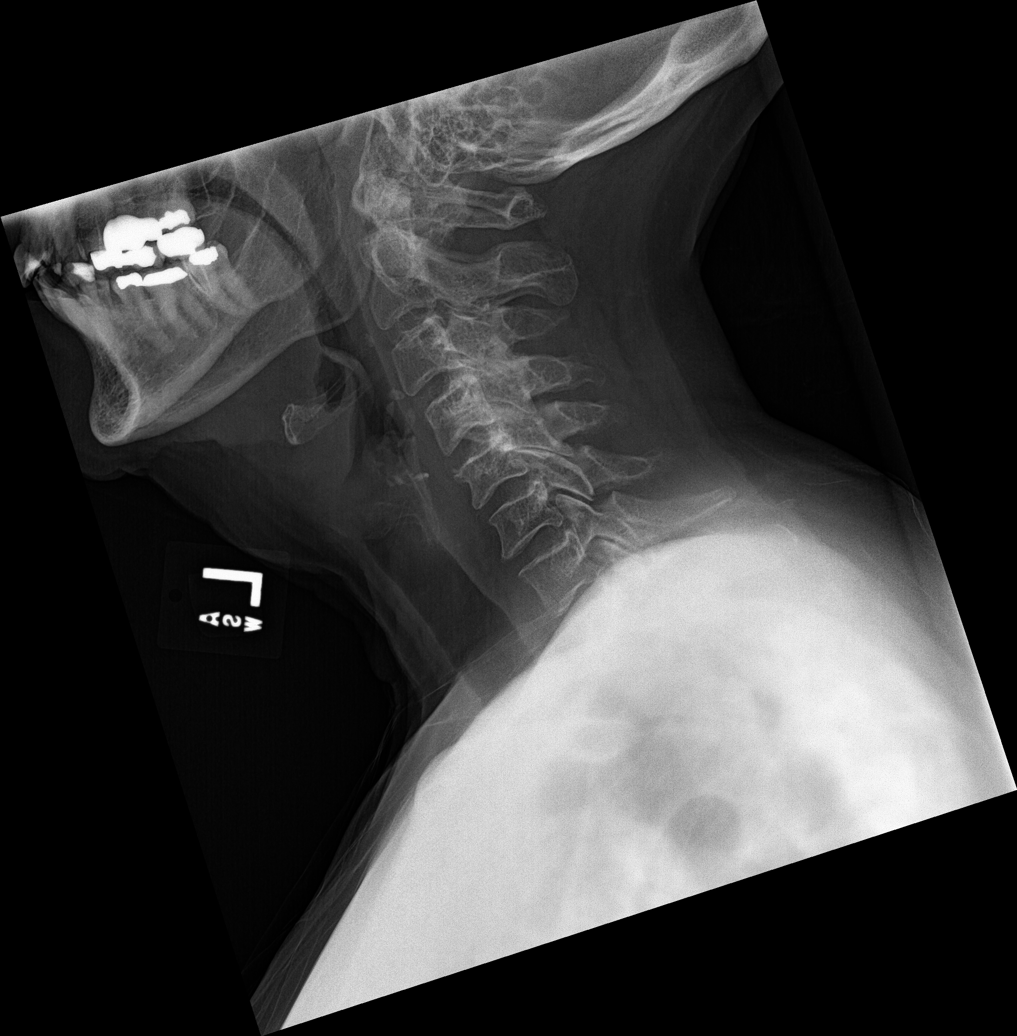

[c-spine obl (1 of 2)]
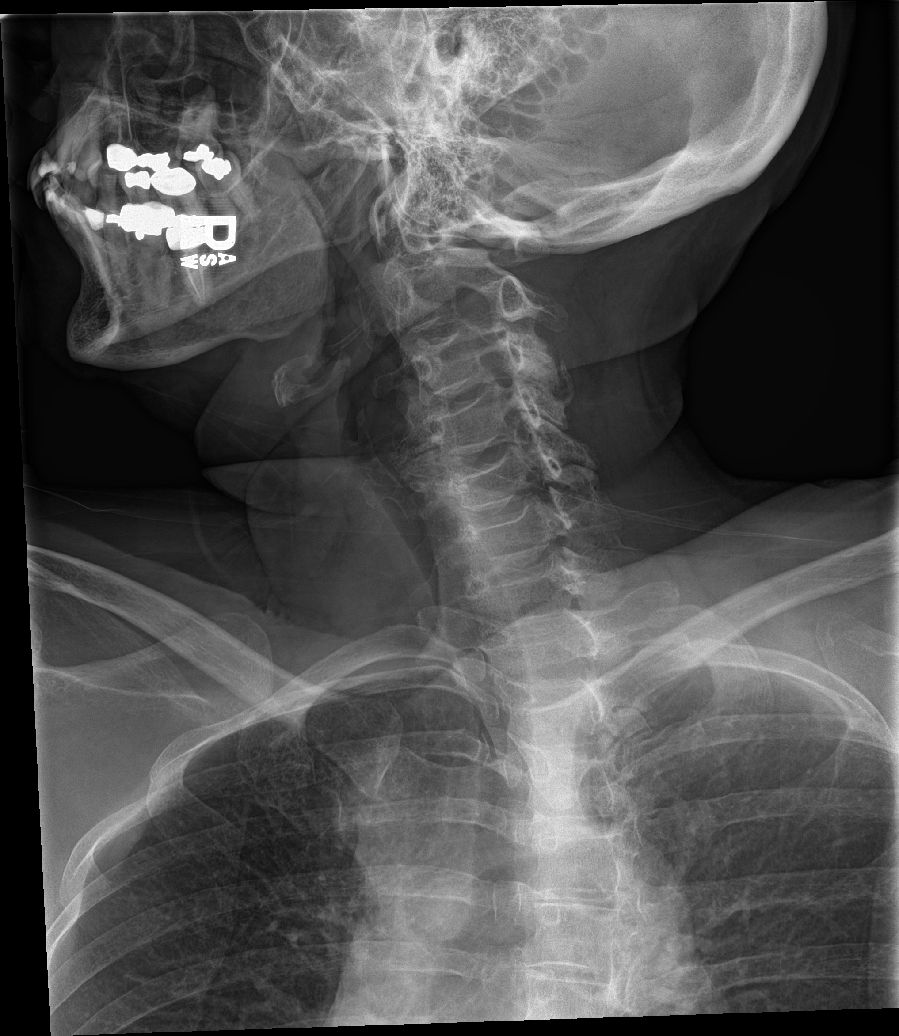

[c-spine obl (2 of 2)]
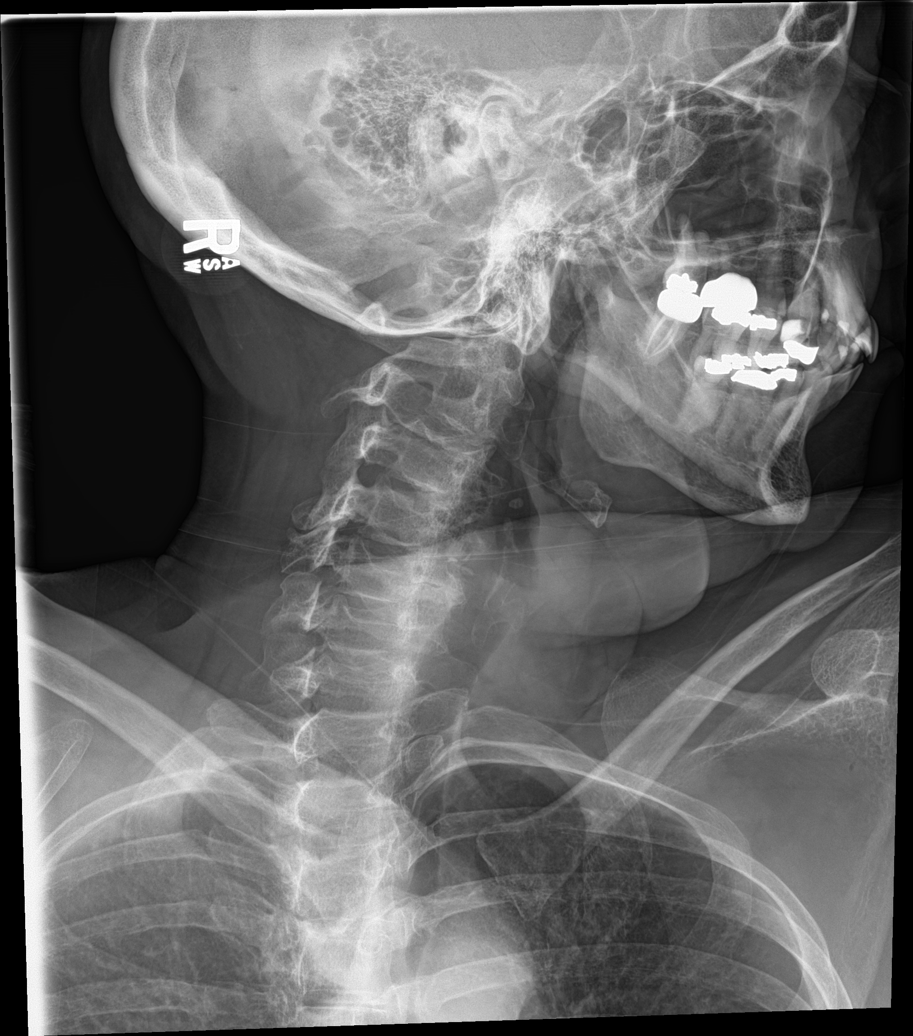

[c-spine open mouth]
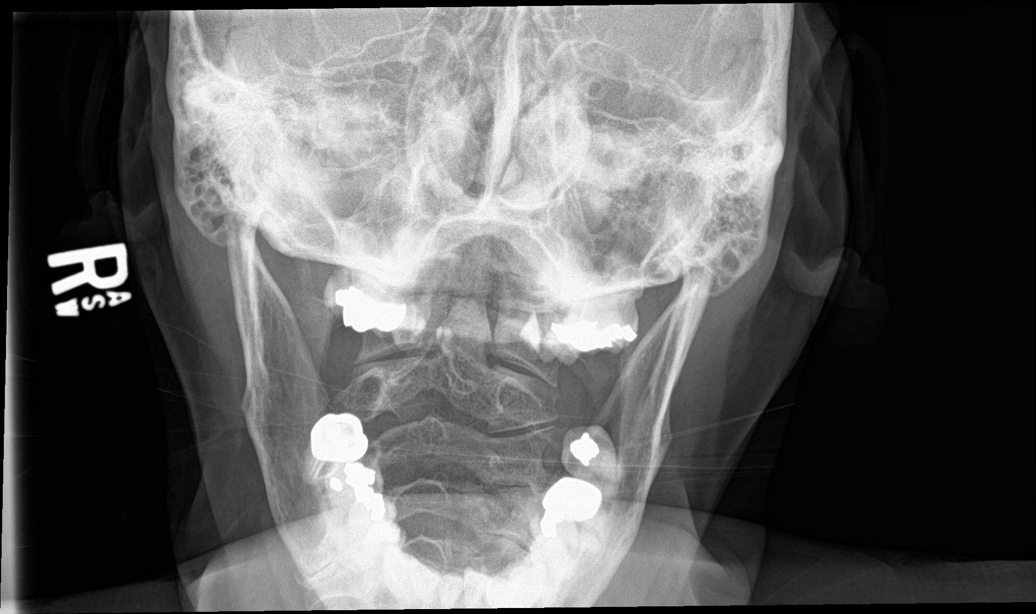

[ct-spine swimmers]
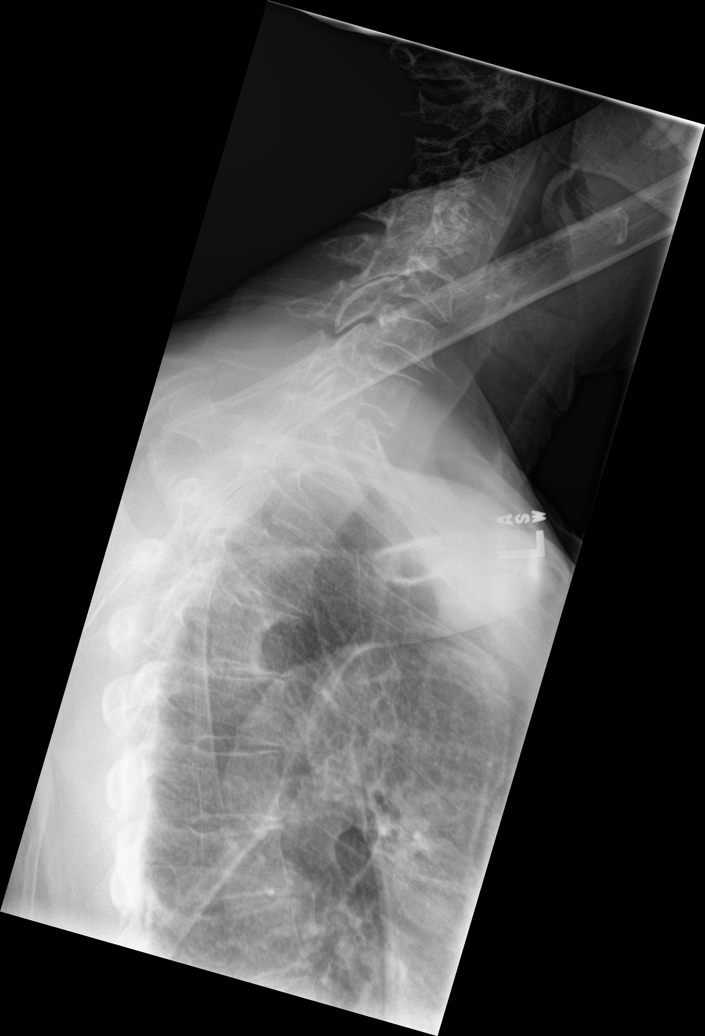

[c-spine ap]
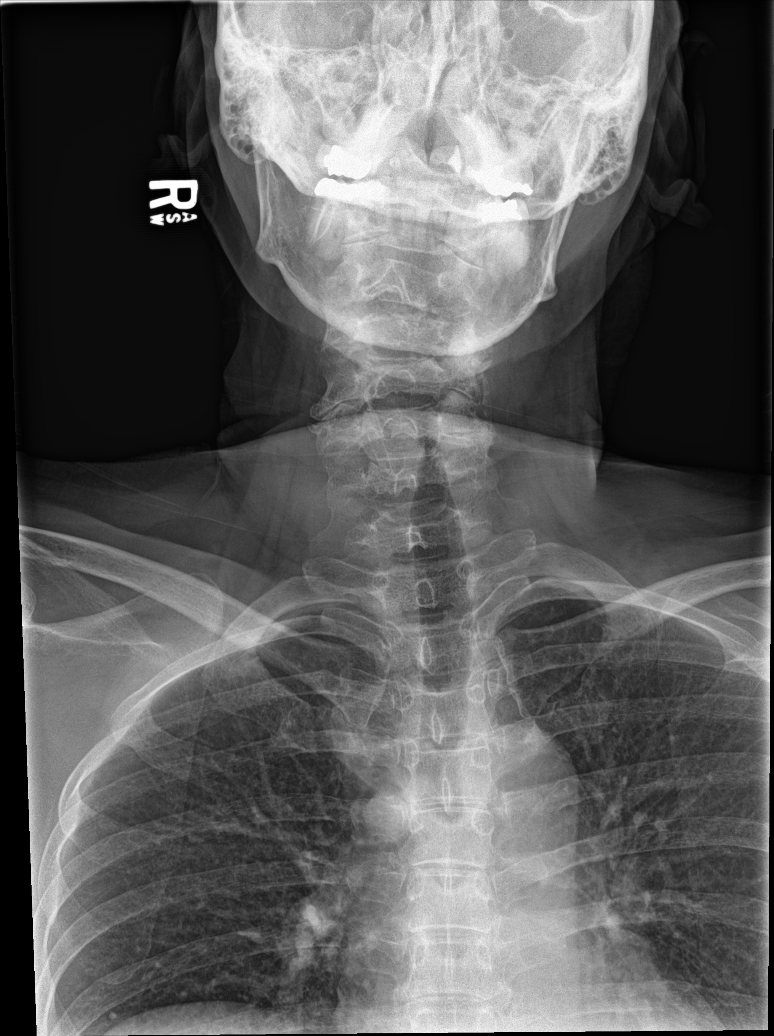

[6 of 6 positions shown; findings below may reference images not displayed]

FINDINGS: There is no acute fracture or subluxation of the cervical spine.
There are multilevel degenerative changes with multilevel facet
arthropathy. The visualized posterior elements and odontoid appear
intact. There is anatomic alignment of the lateral masses of C1 and
C2. The soft tissues are unremarkable.
IMPRESSION: No acute/traumatic cervical spine pathology.

## 2020-03-06 IMAGING — CR DG HIP (WITH OR WITHOUT PELVIS) 2-3V*R*
3 series · 3 of 3 positions shown · non-contrast
Comparison: None.

CLINICAL DATA: 69-year-old female with fall and trauma to the right
lower extremity.

EXAM:
RIGHT KNEE - COMPLETE 4+ VIEW; DG HIP (WITH OR WITHOUT PELVIS) 2-3V
RIGHT

[pelvis ap]
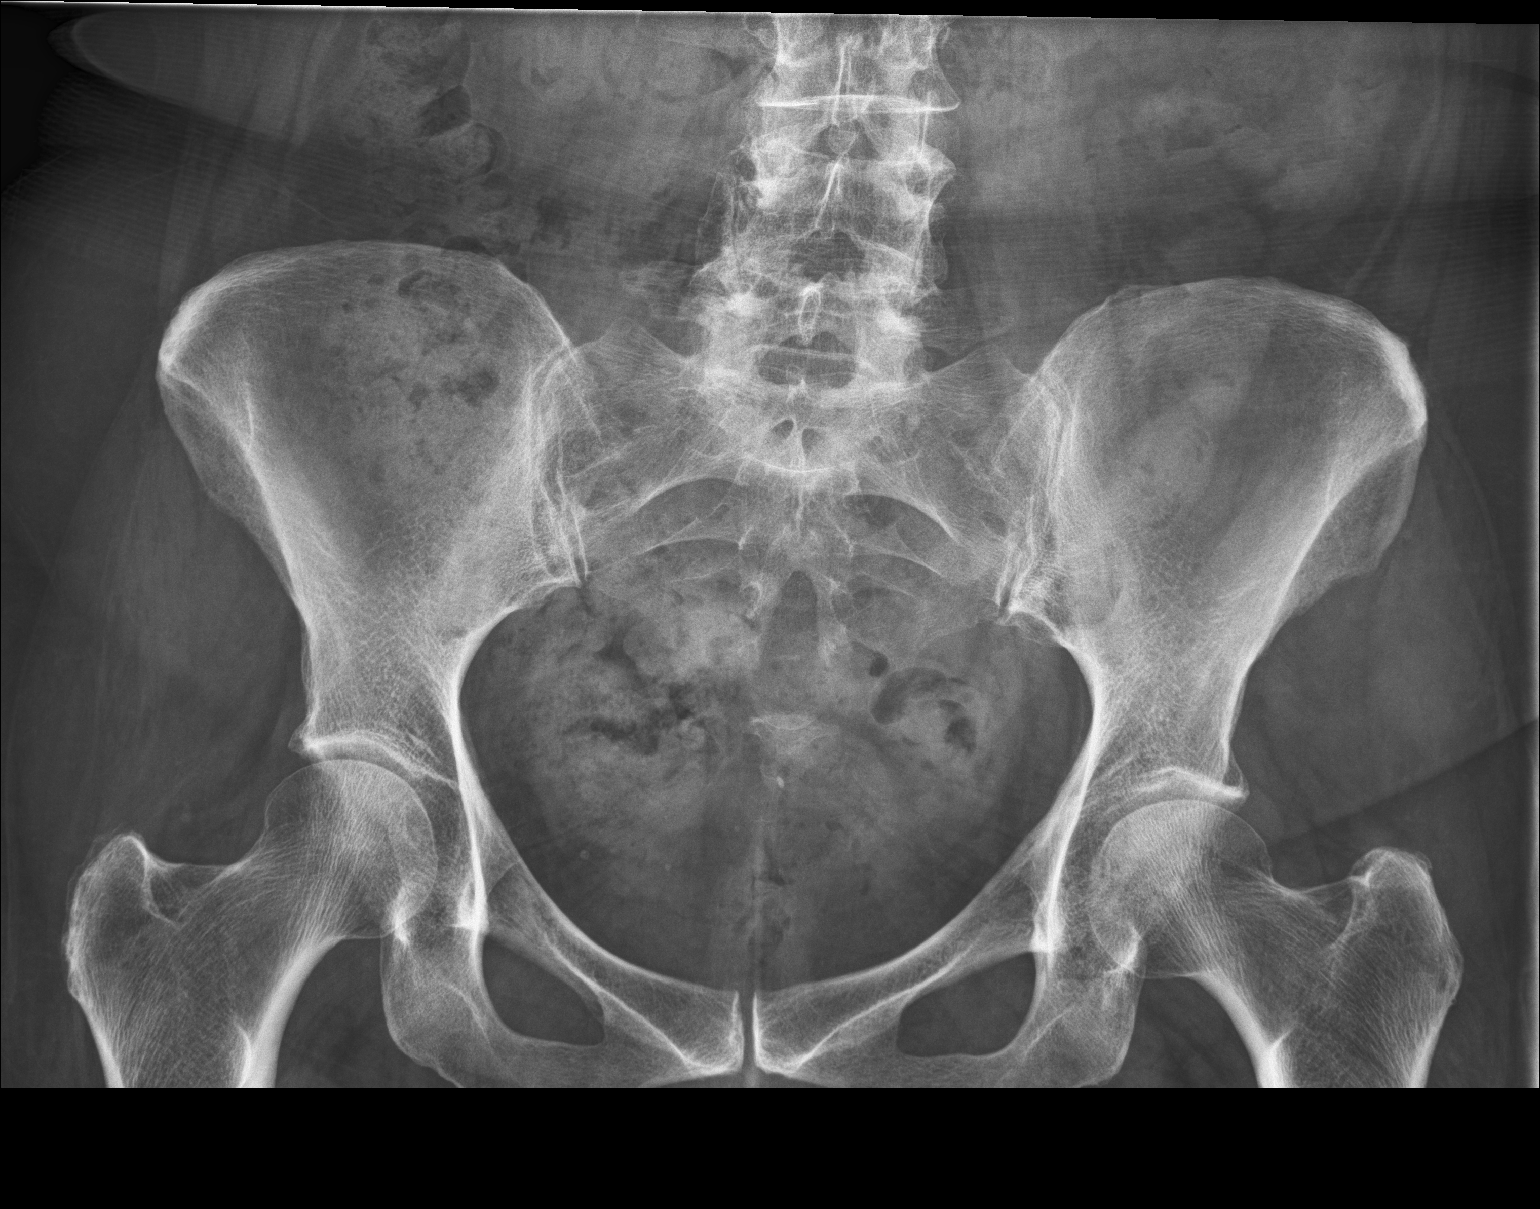

[hip ap]
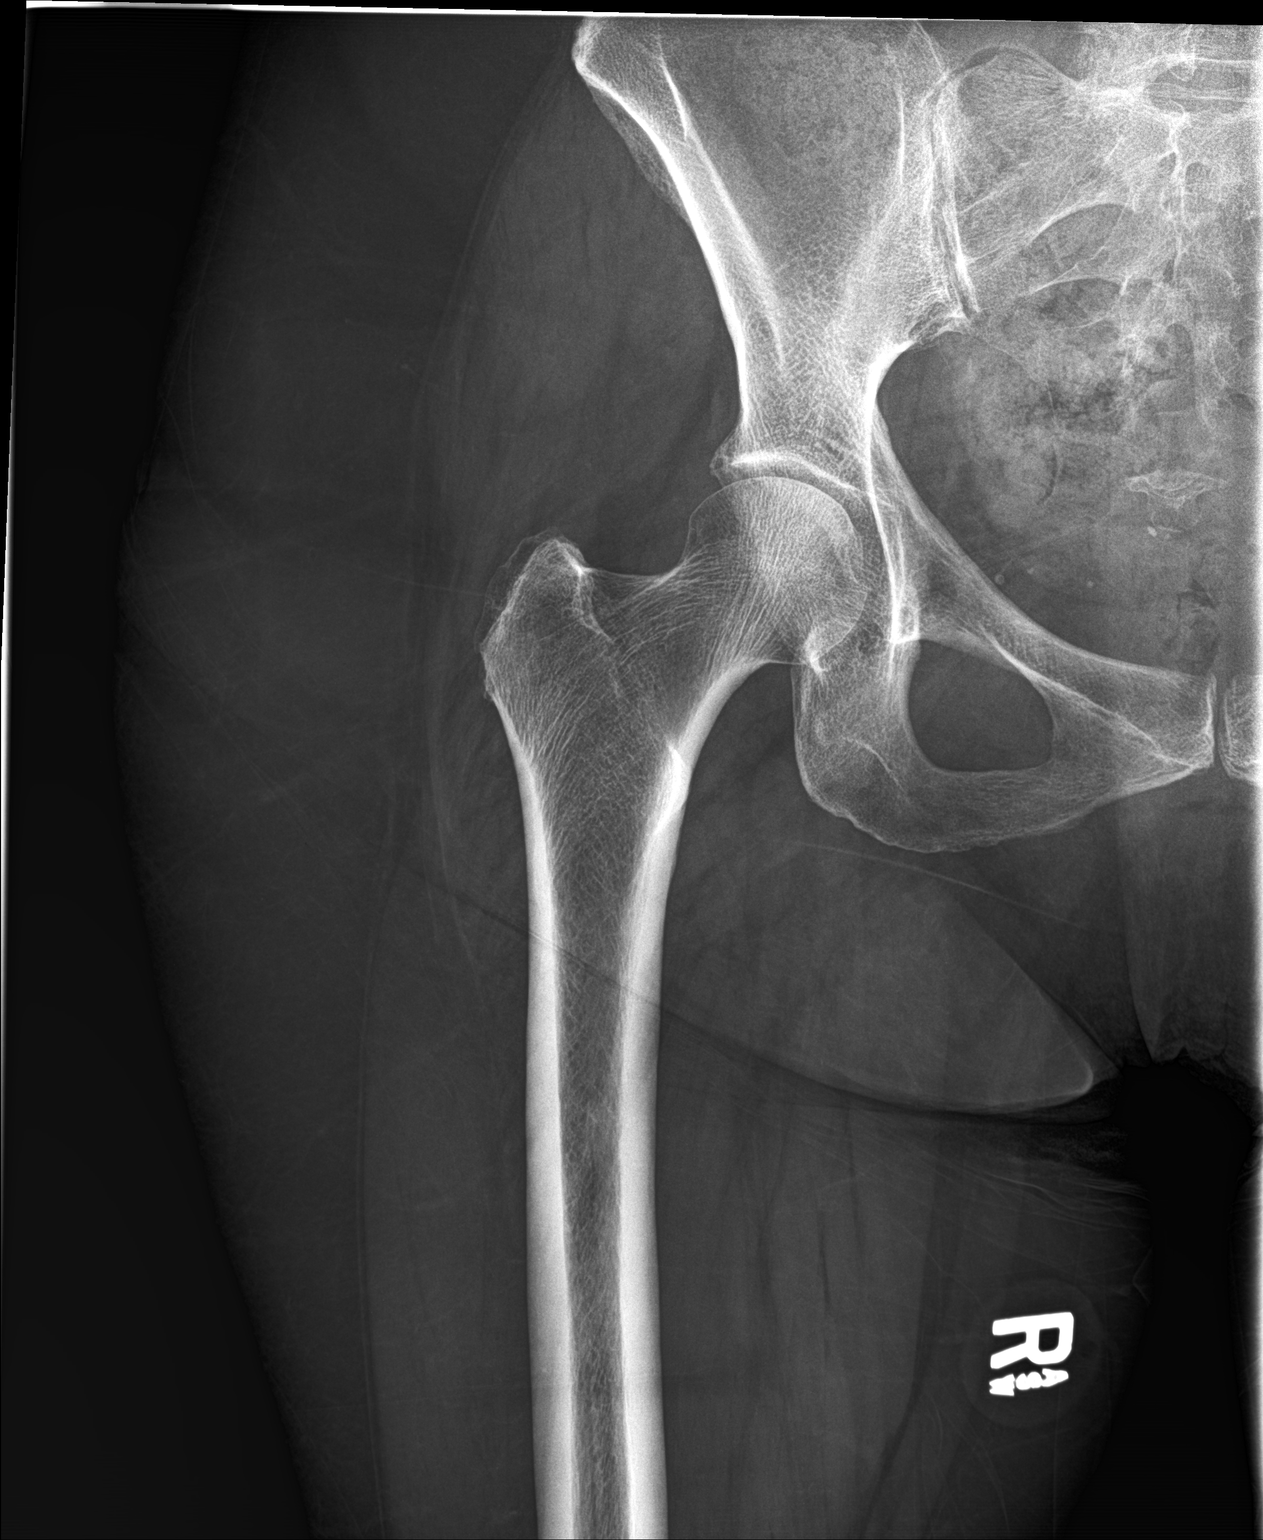

[hip lat]
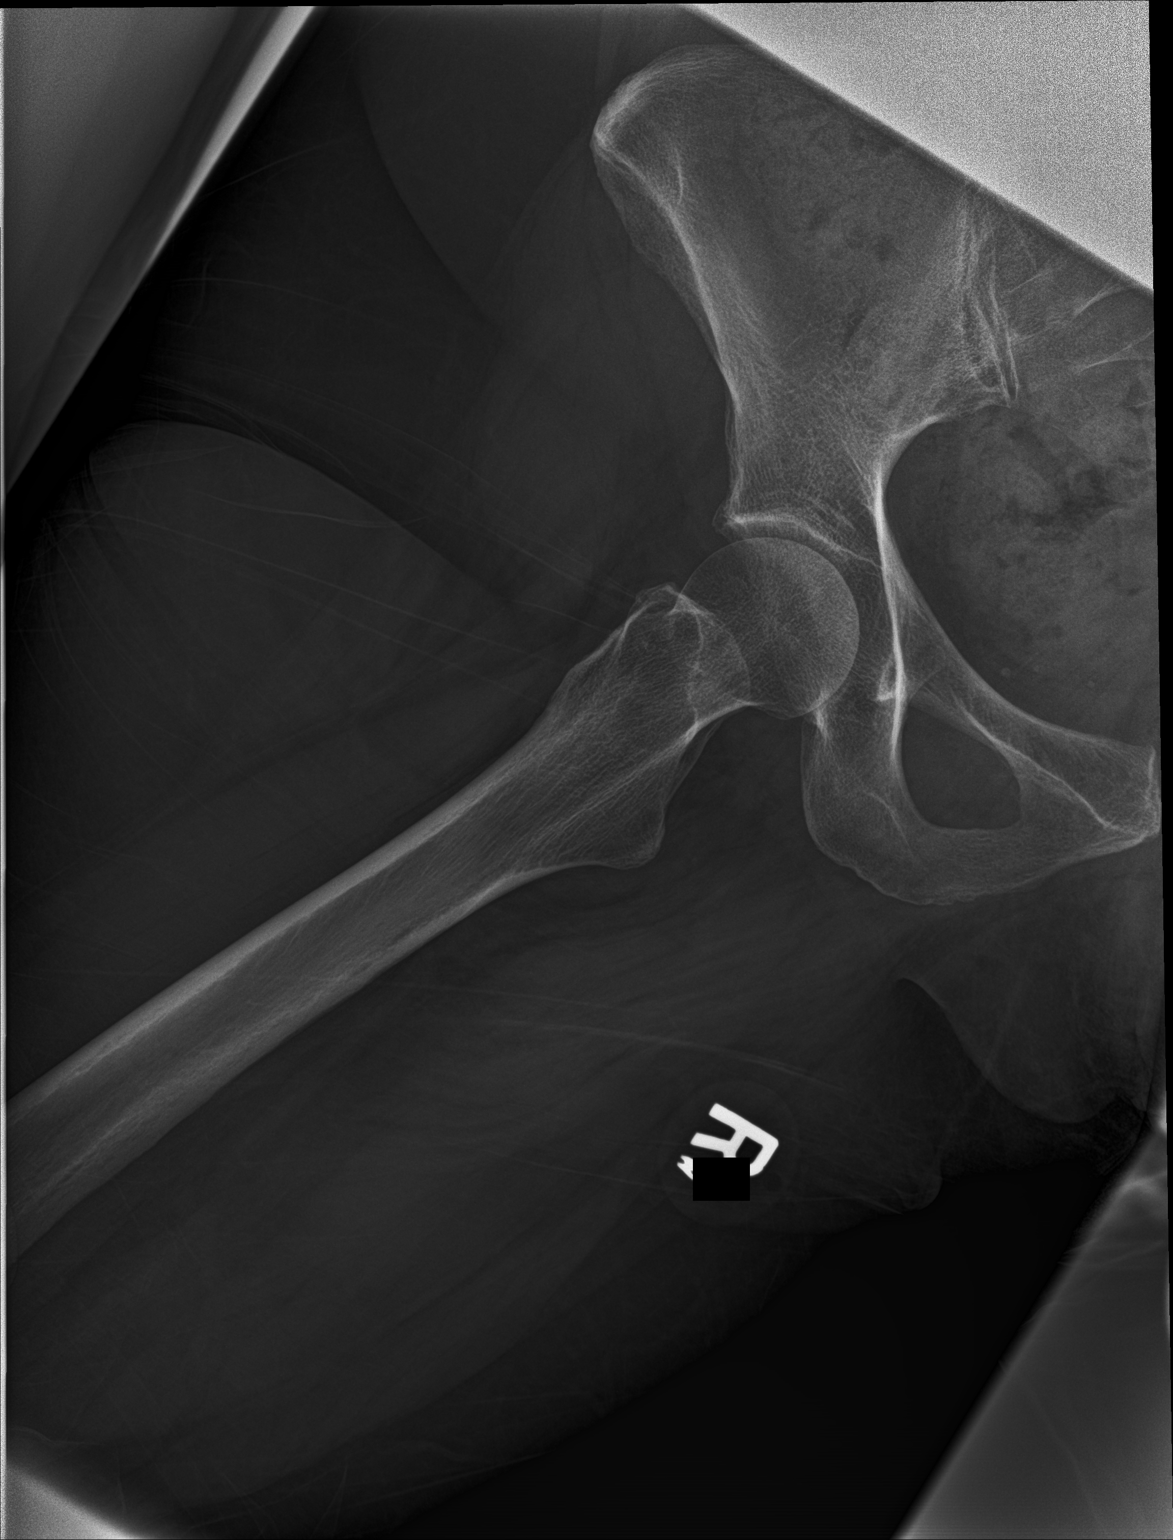

[3 of 3 positions shown; findings below may reference images not displayed]

FINDINGS: There is no acute fracture or dislocation. No significant arthritic
changes. The soft tissues are unremarkable.
IMPRESSION: Negative.

## 2020-03-06 IMAGING — CR DG LUMBAR SPINE COMPLETE 4+V
5 series · 5 of 5 positions shown · non-contrast
Comparison: CT abdomen pelvis dated [DATE].

CLINICAL DATA: 69-year-old female with fall.

EXAM:
LUMBAR SPINE - COMPLETE 4+ VIEW

[l-spine ap]
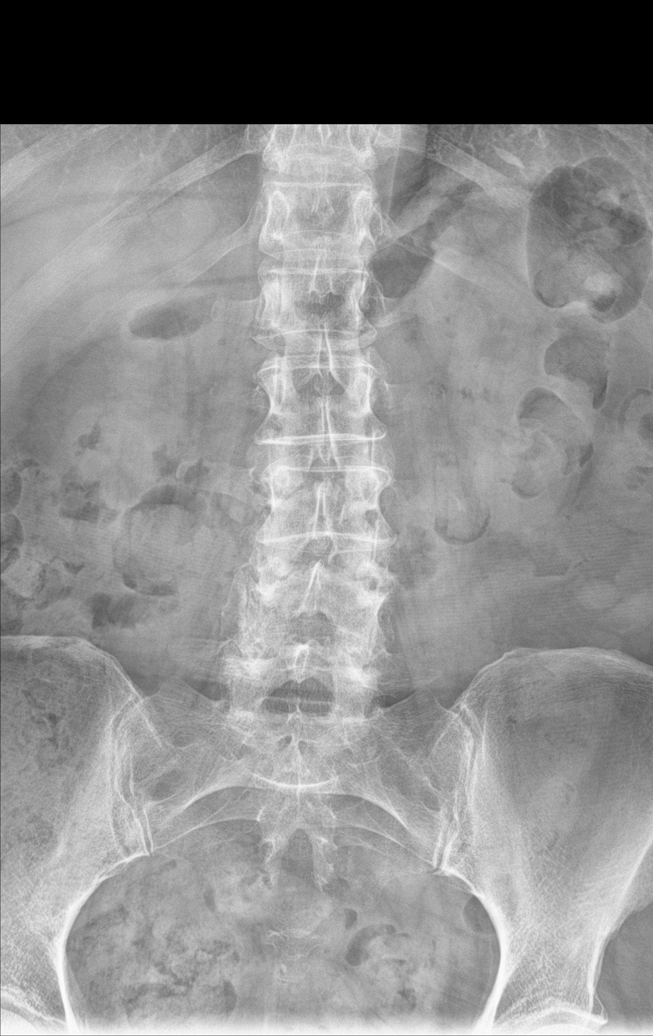

[l-spine obl (1 of 2)]
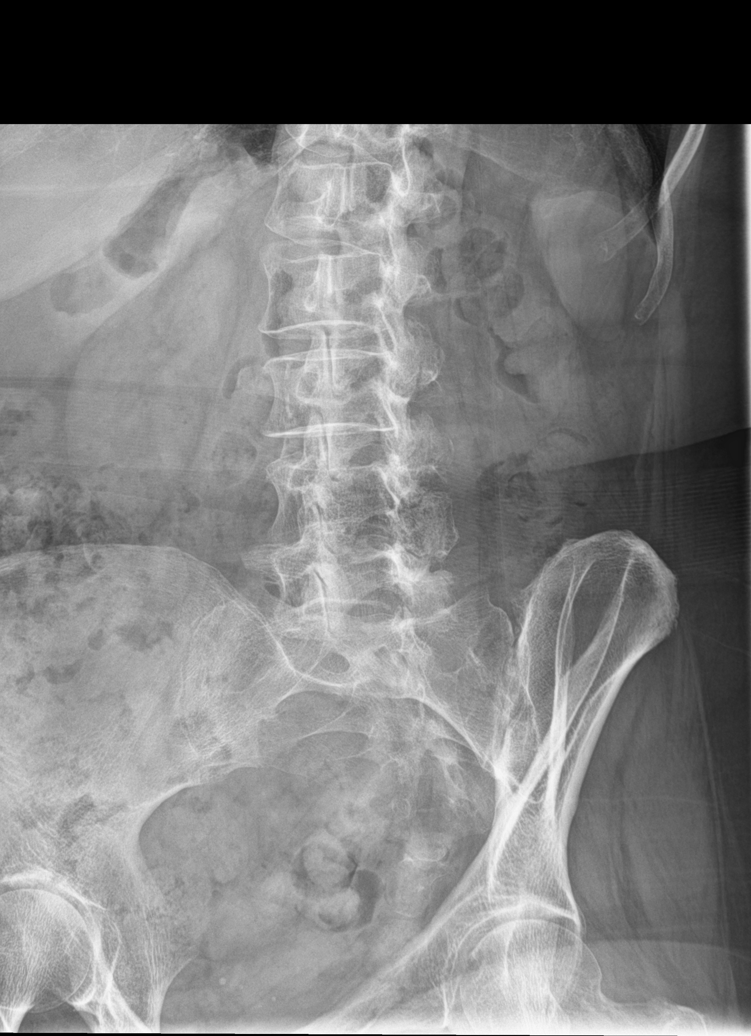

[l-spine obl (2 of 2)]
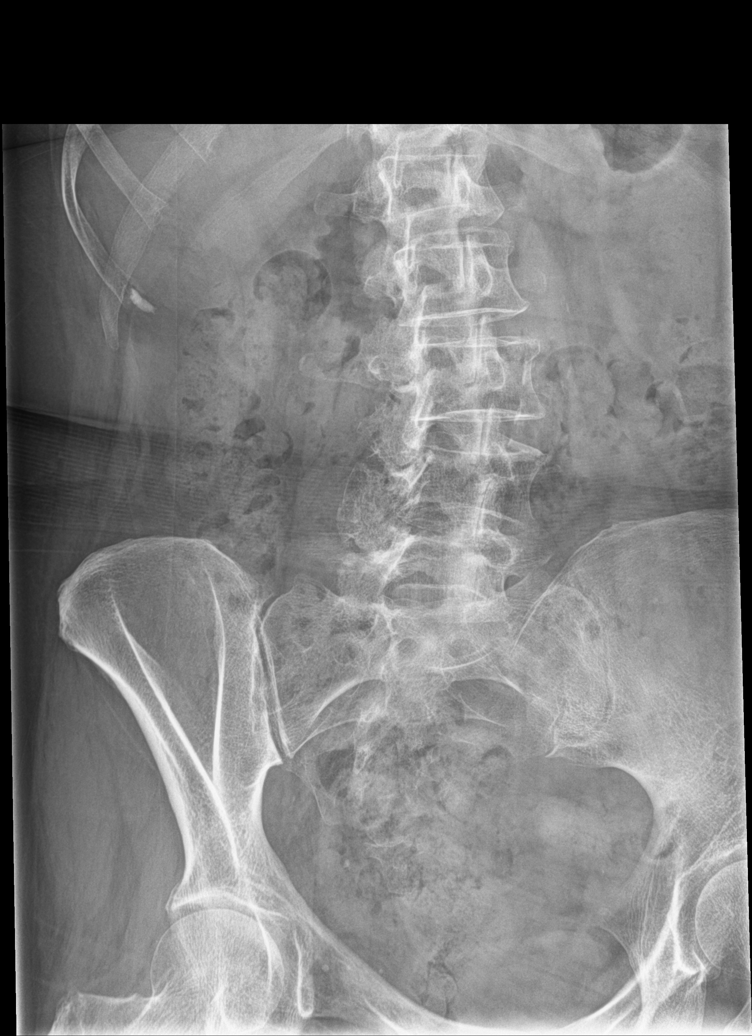

[l-spine lat]
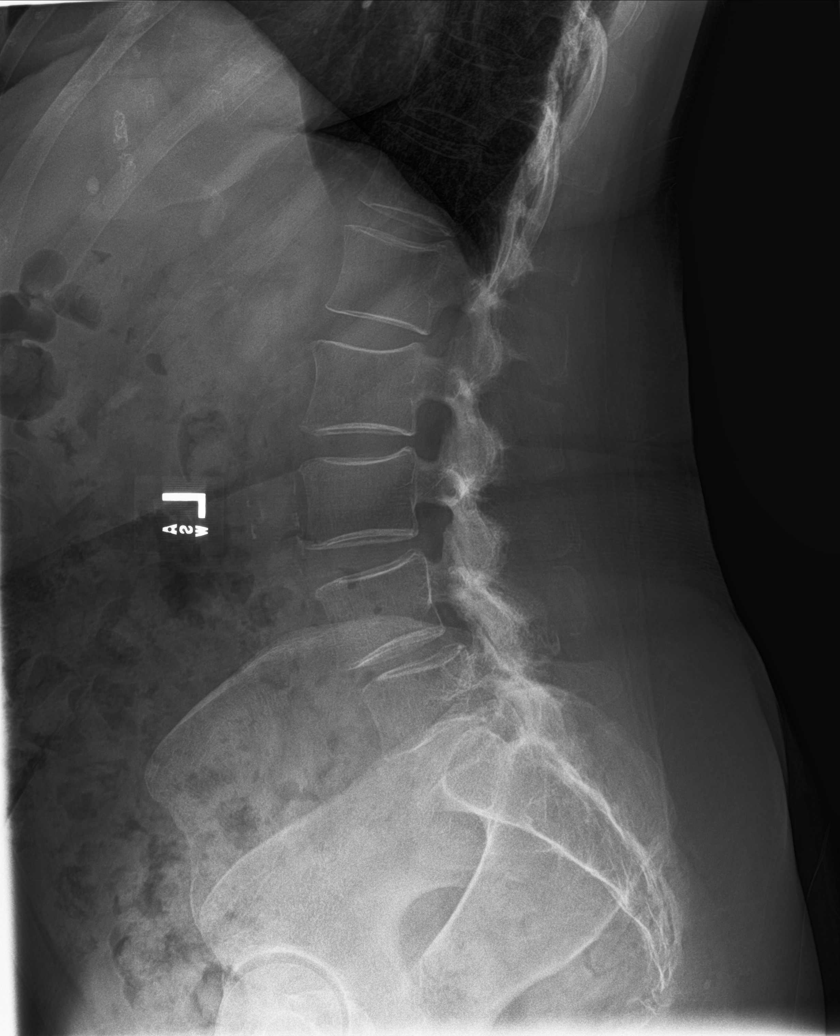

[l-spine spot]
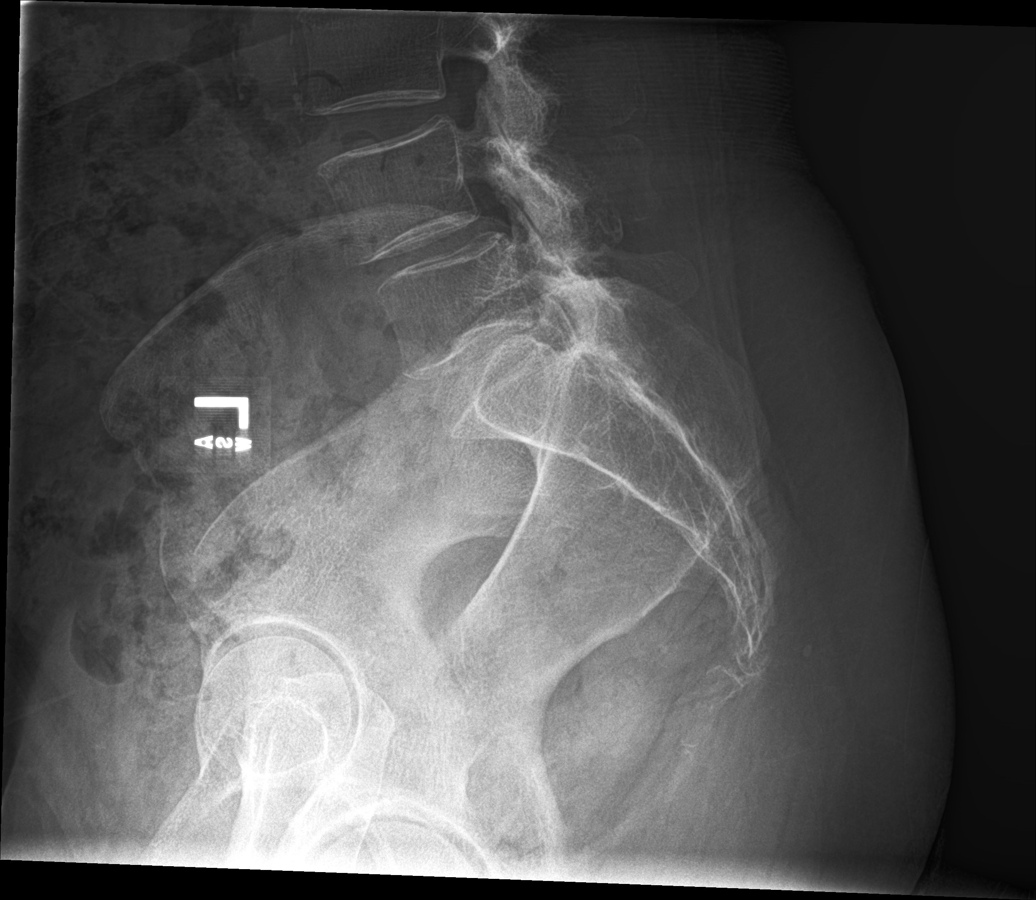

[5 of 5 positions shown; findings below may reference images not displayed]

FINDINGS: Five lumbar type vertebra. There is no acute fracture or subluxation
of the lumbar spine. Degenerative changes with grade 1 L4-L5
anterolisthesis. Lower lumbar facet arthropathy. There is
atherosclerotic calcification of the abdominal aorta.
IMPRESSION: No acute/traumatic lumbar spine pathology.

## 2020-03-06 IMAGING — CR DG KNEE COMPLETE 4+V*R*
4 series · 4 of 4 positions shown · non-contrast
Comparison: None.

CLINICAL DATA: 69-year-old female with fall and trauma to the right
lower extremity.

EXAM:
RIGHT KNEE - COMPLETE 4+ VIEW; DG HIP (WITH OR WITHOUT PELVIS) 2-3V
RIGHT

[knee ap]
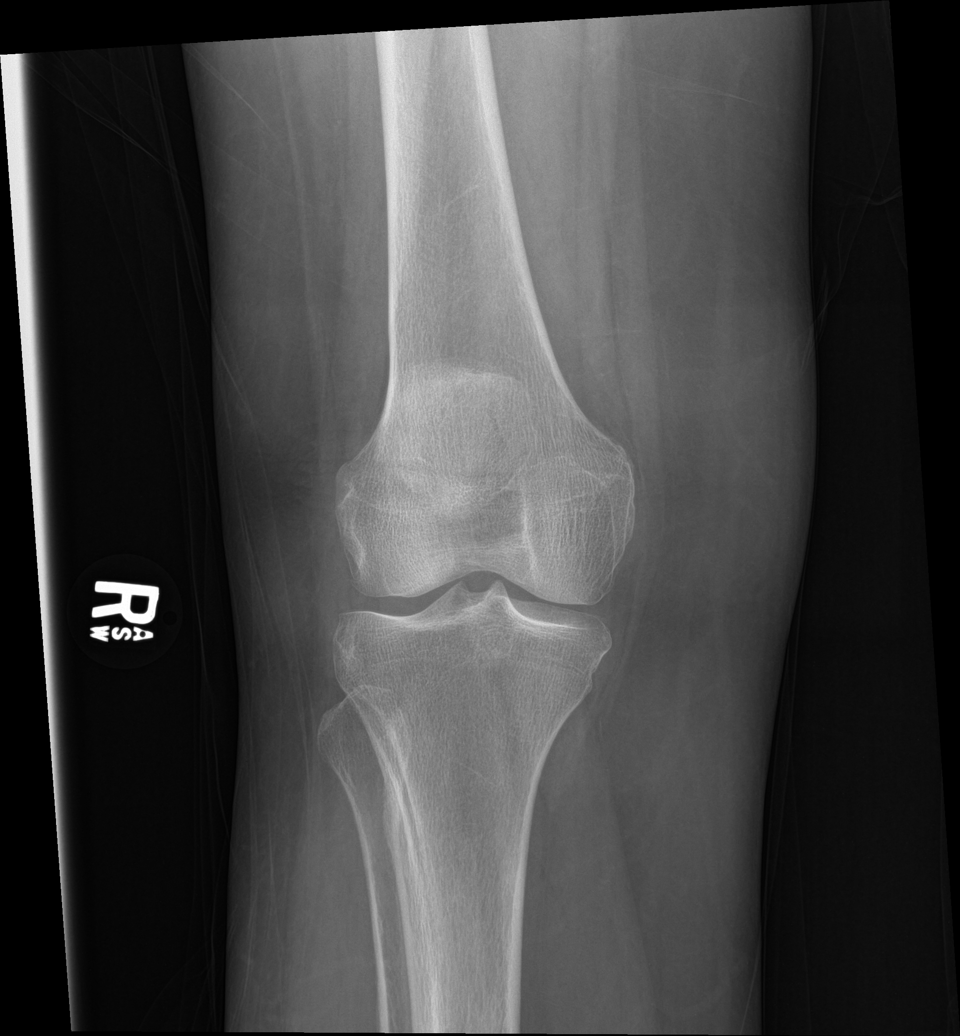

[knee lat]
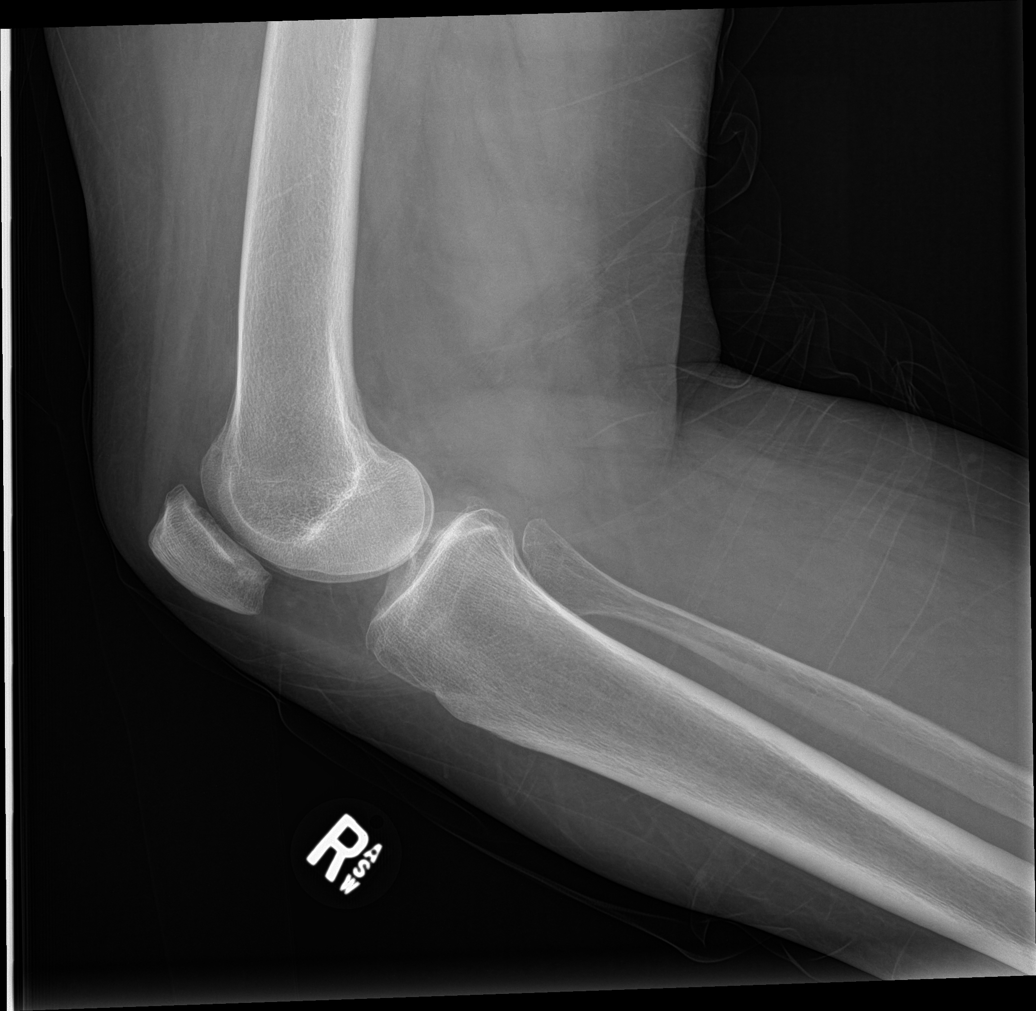

[knee obl (1 of 2)]
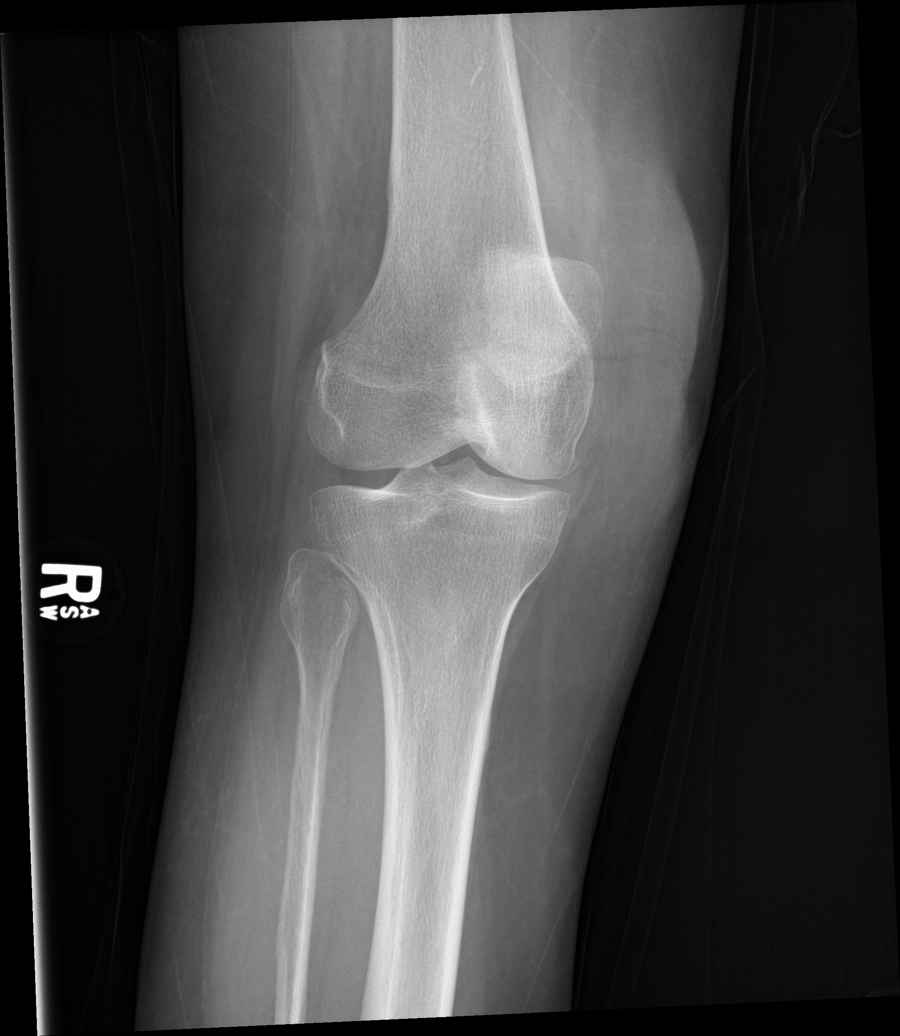

[knee obl (2 of 2)]
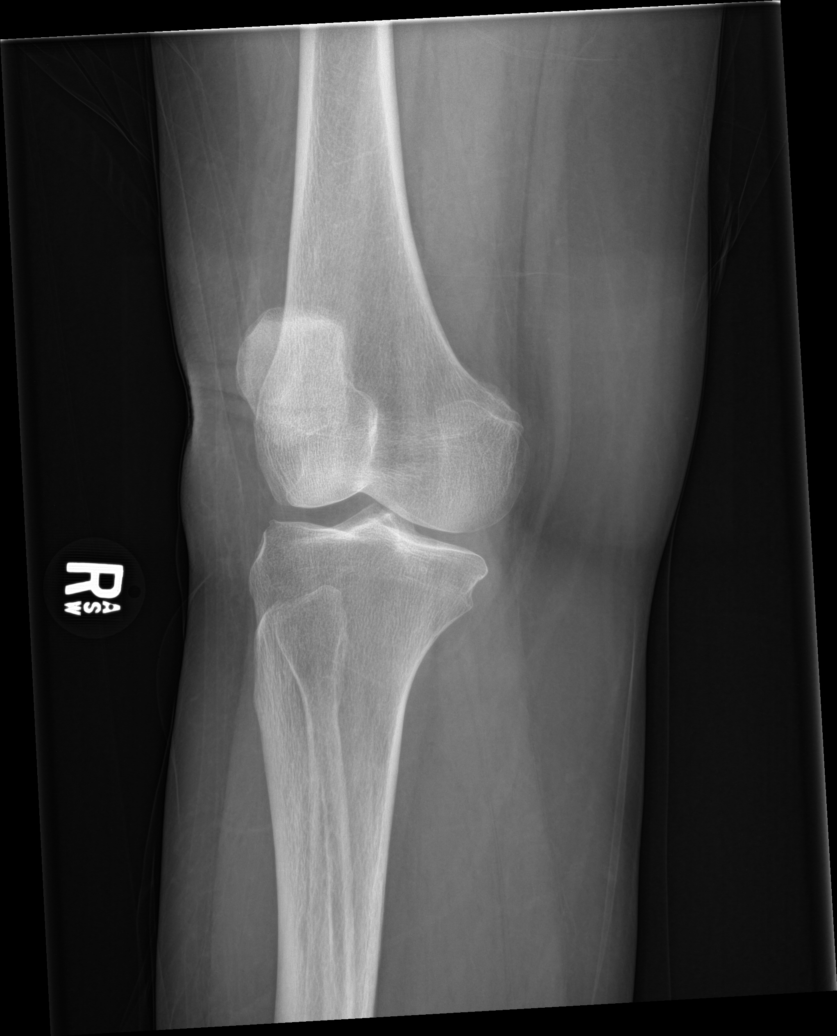

[4 of 4 positions shown; findings below may reference images not displayed]

FINDINGS: There is no acute fracture or dislocation. No significant arthritic
changes. The soft tissues are unremarkable.
IMPRESSION: Negative.

## 2020-03-06 IMAGING — CR DG CLAVICLE*R*
2 series · 2 of 2 positions shown · non-contrast
Comparison: Chest radiograph dated [DATE].

CLINICAL DATA: 69-year-old female with fall.

EXAM:
RIGHT CLAVICLE - 2+ VIEWS

[clavicle axial]
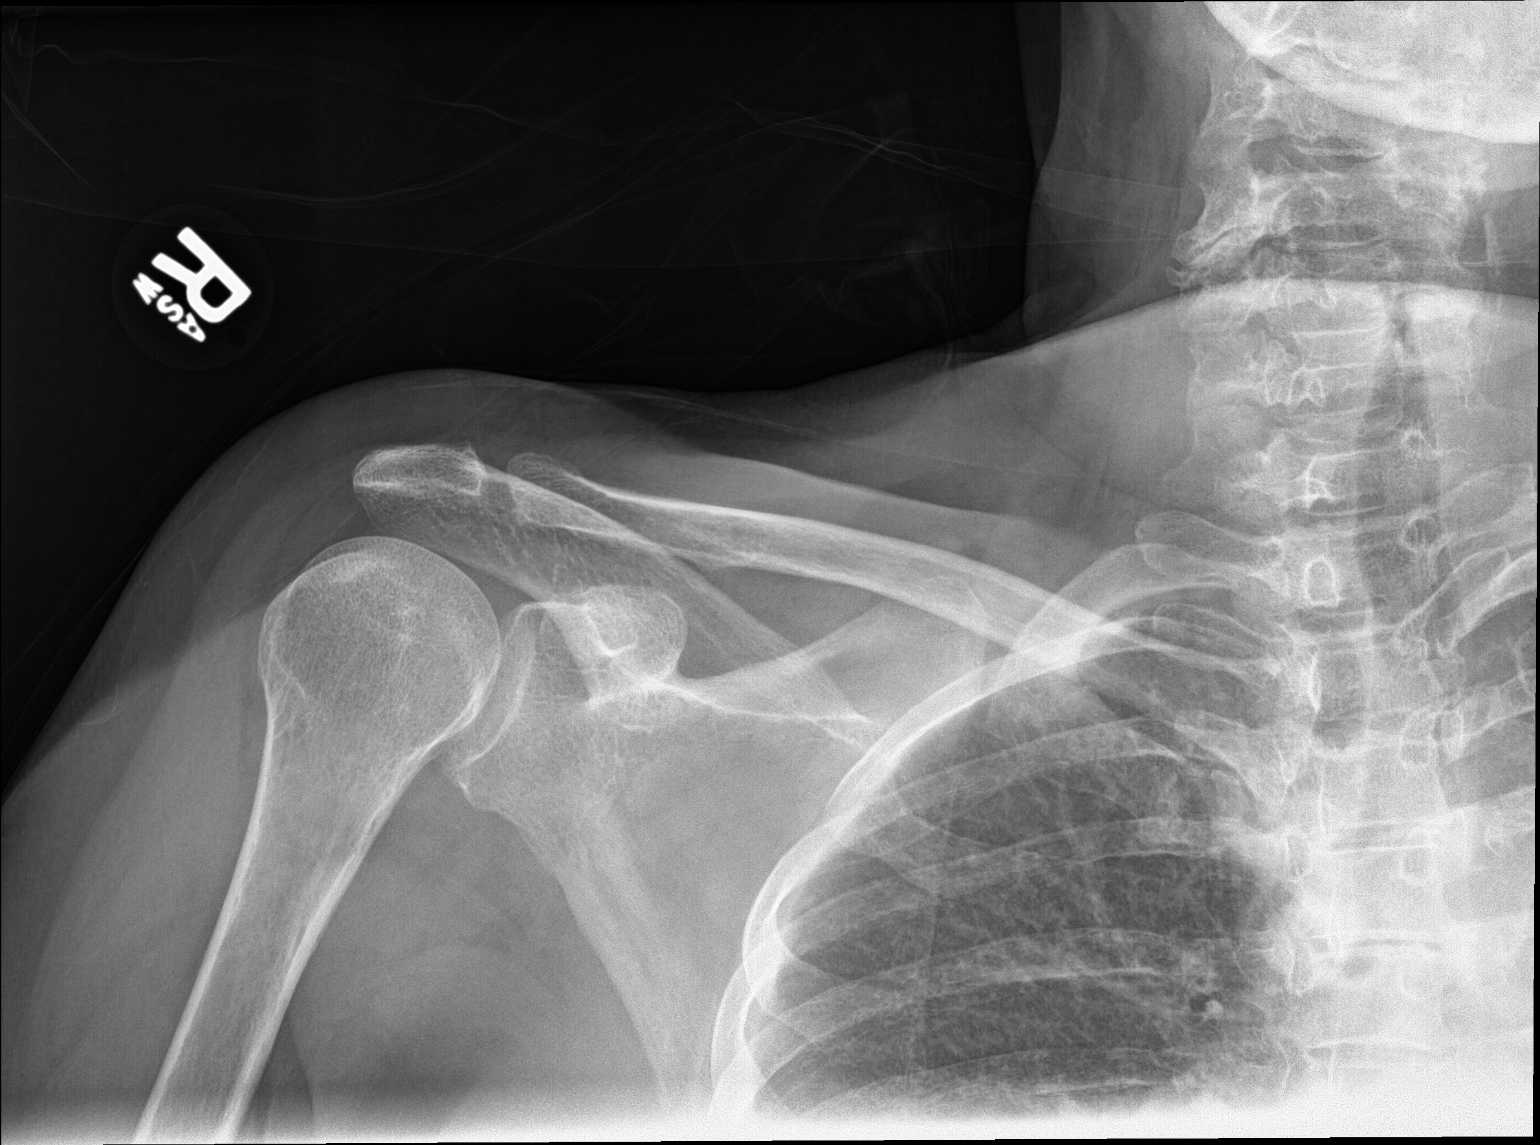

[clavicle ap]
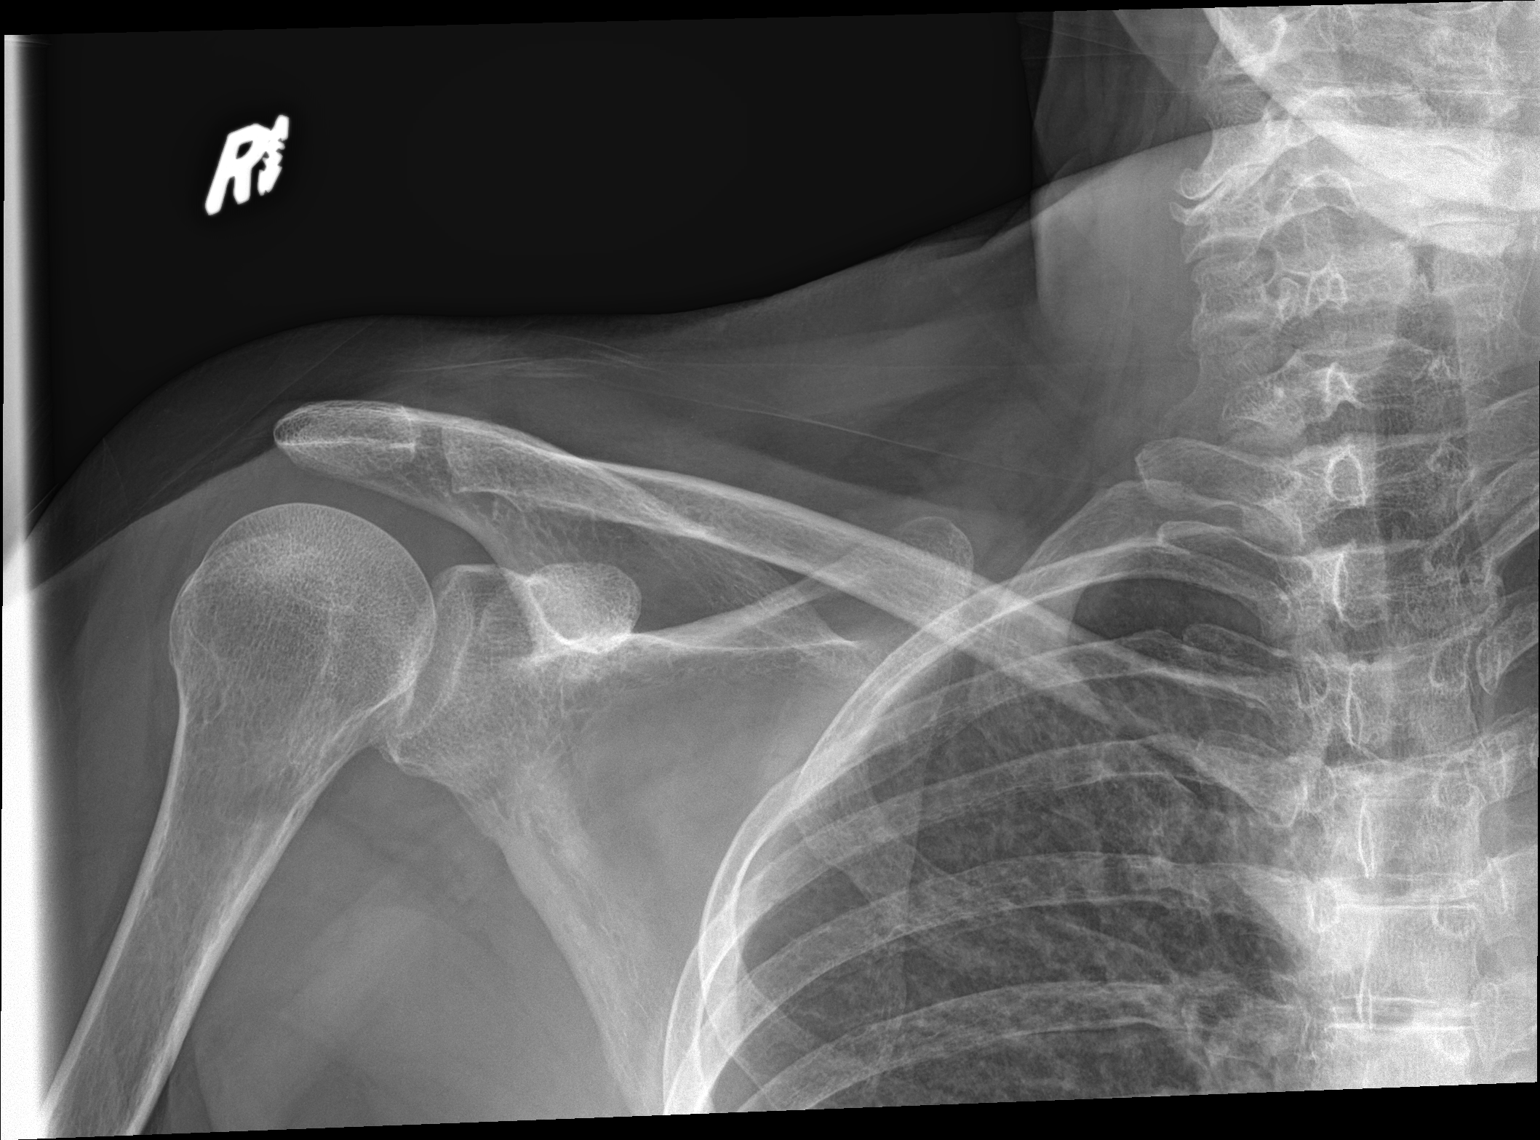

[2 of 2 positions shown; findings below may reference images not displayed]

FINDINGS: There is no evidence of fracture or other focal bone lesions. Soft
tissues are unremarkable.
IMPRESSION: Negative.

## 2020-03-06 MED ORDER — NAPROXEN 500 MG PO TABS: 500.0000 mg | ORAL_TABLET | Freq: Two times a day (BID) | ORAL | 0 refills | Status: DC | PRN

## 2020-03-06 NOTE — ED Provider Notes (Signed)
[facility]    CSN: [id number] Arrival date & time: 03/06/20  1510      History   Chief Complaint Chief Complaint  Patient presents with  . Fall   HPI  69 year old female presents for evaluation after suffering a fall.  Patient states that she slipped on a wet kitchen floor.  She landed on her right side.  She has essentially pain on her entire right side.  She is most bothered by right low back pain,, right knee pain, right hip pain, right thigh pain, right clavicle pain, neck pain.  Rates her pain a 6/10 in severity.  No relieving factors.  No interventions tried.  No other reported symptoms.  No other complaints.  Past Medical History:  Diagnosis Date  . Arthritis   . C. difficile diarrhea    age 19s-40s   . Chicken pox   . Cholecystitis 11/2011   Did not require sgy - Dr. Staci Acosta - [facility]  (cholelithiasis)  . H/O Clostridium difficile infection   . IBS (irritable bowel syndrome)   . MRSA exposure 2005   Spider bite  . MVP (mitral valve prolapse)    Stable - Dr. Ubaldo Glassing  . Rheumatic fever     Patient Active Problem List   Diagnosis Date Noted  . Welcome to Medicare preventive visit 06/20/2019  . Viral syndrome 01/30/2019  . Itching 07/21/2017  . Asthma 07/16/2016  . Urinary incontinence 07/16/2016  . SOB (shortness of breath) 04/01/2016  . Bilateral shoulder pain 02/24/2016  . Neck fullness 01/28/2016  . Routine general medical examination at a health care facility 07/25/2015  . Health care maintenance 10/09/2014  . Neck pain 11/26/2013  . Hypercholesterolemia 11/26/2013  . History of colonic polyps 05/11/2013  . Hyperbilirubinemia 01/18/2013  . GERD (gastroesophageal reflux disease) 12/03/2012  . Cholelithiasis 11/07/2012  . IBS (irritable bowel syndrome) 11/07/2012  . History of rheumatic fever 08/28/2012  . MVP (mitral valve prolapse) 08/28/2012    Past Surgical History:  Procedure Laterality Date  . CATARACT EXTRACTION  [id number]  . MUSCLE  BIOPSY      OB History   No obstetric history on file.      Home Medications    Prior to Admission medications   Medication Sig Start Date End Date Taking? Authorizing Provider  albuterol (PROVENTIL HFA;VENTOLIN HFA) 108 (90 Base) MCG/ACT inhaler Inhale 2 puffs into the lungs every 6 (six) hours as needed for wheezing or shortness of breath. Patient not taking: Reported on 12/02/2019 10/10/18   Jodelle Green, FNP  ANUCORT-HC 25 MG suppository UNW AND I 1 SUP REC BID 12/31/17   [provider]  Ascorbic Acid (VITAMIN C PO) Take by mouth.    [provider]  aspirin 325 MG tablet Take by mouth.    [provider]  bisacodyl (DULCOLAX) 5 MG EC tablet Take 5 mg by mouth daily as needed for moderate constipation.    [provider]  CALCIUM PO Take by mouth.    [provider]  cholecalciferol (VITAMIN D3) 25 MCG (1000 UT) tablet Take 1,000 Units by mouth once a week.    [provider]  fluticasone (FLONASE) 50 MCG/ACT nasal spray SHAKE LIQUID AND USE 1 SPRAY IN EACH NOSTRIL TWICE DAILY Patient not taking: Reported on 12/02/2019 10/14/18   Einar Pheasant, MD  Multiple Vitamins-Minerals (PRESERVISION AREDS PO) Take by mouth.    [provider]  multivitamin-iron-minerals-folic acid (CENTRUM) chewable tablet Chew 1 tablet by mouth daily.  [provider]  naproxen (NAPROSYN) 500 MG tablet Take 1 tablet (500 mg total) by mouth 2 (two) times daily as needed for mild pain or moderate pain. 03/06/20   Coral Spikes, DO  Polyethyl Glycol-Propyl Glycol (SYSTANE OP) Apply to eye.    [provider]  senna (SENOKOT) 8.6 MG TABS tablet Take 1 tablet by mouth daily.    [provider]  sodium chloride (OCEAN) 0.65 % SOLN nasal spray Place 1 spray into both nostrils as needed for congestion. 12/02/19   McLean-Scocuzza, Nino Glow, MD    Family History Family History  Problem Relation Age of Onset  . Arthritis Mother   .  Stroke Mother   . Diabetes Mother   . Hypertension Mother   . Arthritis Father   . Stroke Father   . Diabetes Paternal Grandmother   . Cancer Paternal Uncle        colon  . Heart disease Other        maternal and paternal side  . Breast cancer Paternal Aunt   . Breast cancer Cousin   . Breast cancer Cousin        female cousin  . Breast cancer Cousin     Social History Social History   Tobacco Use  . Smoking status: Never Smoker  . Smokeless tobacco: Never Used  Vaping Use  . Vaping Use: Never used  Substance Use Topics  . Alcohol use: Yes    Alcohol/week: 0.0 standard drinks    Comment: Rarely  . Drug use: No     Allergies   Other, Adhesive [tape], Tartrazine, and Yellow dyes (non-tartrazine)   Review of Systems Review of Systems  Constitutional: Negative.   Musculoskeletal:       Back pain, knee pain, hip pain, thigh pain, low back pain, neck pain, clavicle pain.  Neurological: Negative.    Physical Exam Triage Vital Signs ED Triage Vitals  Enc Vitals Group     BP 03/06/20 1527 (!) 109/50     Pulse Rate 03/06/20 1527 93     Resp 03/06/20 1527 18     Temp 03/06/20 1527 99 F (37.2 C)     Temp Source 03/06/20 1527 Oral     SpO2 03/06/20 1527 97 %     Weight 03/06/20 1526 187 lb 6.3 oz (85 kg)     Height 03/06/20 1526 5\' 7"  (1.702 m)     Head Circumference --      Peak Flow --      Pain Score 03/06/20 1525 6     Pain Loc --      Pain Edu? --      Excl. in [place]? --    Updated Vital Signs BP (!) 109/50 (BP Location: Left Arm)   Pulse 93   Temp 99 F (37.2 C) (Oral)   Resp 18   Ht 5\' 7"  (1.702 m)   Wt 85 kg   SpO2 97%   BMI 29.35 kg/m   Visual Acuity Right Eye Distance:   Left Eye Distance:   Bilateral Distance:    Right Eye Near:   Left Eye Near:    Bilateral Near:     Physical Exam Constitutional:      General: She is not in acute distress.    Appearance: Normal appearance. She is obese. She is not ill-appearing.  HENT:     Head:  Normocephalic and atraumatic.  Eyes:     General:  Right eye: No discharge.        Left eye: No discharge.     Conjunctiva/sclera: Conjunctivae normal.  Cardiovascular:     Rate and Rhythm: Normal rate and regular rhythm.  Pulmonary:     Effort: Pulmonary effort is normal.     Breath sounds: Normal breath sounds. No wheezing, rhonchi or rales.  Musculoskeletal:     Cervical back: Normal range of motion.     Comments: Right knee -no discrete areas of tenderness.  No apparent effusion.  No bruising.  Right hip -tenderness over the greater trochanter.  Low back -right paraspinal musculature tenderness to palpation.  Clavicle -right clavicle with no discrete areas of swelling or tenderness to palpation.  Neurological:     Mental Status: She is alert.  Psychiatric:        Mood and Affect: Mood normal.        Behavior: Behavior normal.    UC Treatments / Results  Labs (all labs ordered are listed, but only abnormal results are displayed) Labs Reviewed - No data to display  EKG   Radiology DG Cervical Spine Complete  Result Date: 03/06/2020 CLINICAL DATA:  69 year old female with fall. EXAM: CERVICAL SPINE - COMPLETE 4+ VIEW COMPARISON:  Cervical spine radiograph dated 12/26/2015. FINDINGS: There is no acute fracture or subluxation of the cervical spine. There are multilevel degenerative changes with multilevel facet arthropathy. The visualized posterior elements and odontoid appear intact. There is anatomic alignment of the lateral masses of C1 and C2. The soft tissues are unremarkable. IMPRESSION: No acute/traumatic cervical spine pathology. Electronically Signed   By: Anner Crete M.D.   On: 03/06/2020 16:47   DG Lumbar Spine Complete  Result Date: 03/06/2020 CLINICAL DATA:  69 year old female with fall. EXAM: LUMBAR SPINE - COMPLETE 4+ VIEW COMPARISON:  CT abdomen pelvis dated 06/17/2018. FINDINGS: Five lumbar type vertebra. There is no acute fracture or subluxation of  the lumbar spine. Degenerative changes with grade 1 L4-L5 anterolisthesis. Lower lumbar facet arthropathy. There is atherosclerotic calcification of the abdominal aorta. IMPRESSION: No acute/traumatic lumbar spine pathology. Electronically Signed   By: Anner Crete M.D.   On: 03/06/2020 16:45   DG Clavicle Right  Result Date: 03/06/2020 CLINICAL DATA:  69 year old female with fall. EXAM: RIGHT CLAVICLE - 2+ VIEWS COMPARISON:  Chest radiograph dated 10/24/2017. FINDINGS: There is no evidence of fracture or other focal bone lesions. Soft tissues are unremarkable. IMPRESSION: Negative. Electronically Signed   By: Anner Crete M.D.   On: 03/06/2020 16:41   DG Knee Complete 4 Views Right  Result Date: 03/06/2020 CLINICAL DATA:  68 year old female with fall and trauma to the right lower extremity. EXAM: RIGHT KNEE - COMPLETE 4+ VIEW; DG HIP (WITH OR WITHOUT PELVIS) 2-3V RIGHT COMPARISON:  None. FINDINGS: There is no acute fracture or dislocation. No significant arthritic changes. The soft tissues are unremarkable. IMPRESSION: Negative. Electronically Signed   By: Anner Crete M.D.   On: 03/06/2020 16:43   DG Hip Unilat W or Wo Pelvis 2-3 Views Right  Result Date: 03/06/2020 CLINICAL DATA:  69 year old female with fall and trauma to the right lower extremity. EXAM: RIGHT KNEE - COMPLETE 4+ VIEW; DG HIP (WITH OR WITHOUT PELVIS) 2-3V RIGHT COMPARISON:  None. FINDINGS: There is no acute fracture or dislocation. No significant arthritic changes. The soft tissues are unremarkable. IMPRESSION: Negative. Electronically Signed   By: Anner Crete M.D.   On: 03/06/2020 16:43    Procedures Procedures (including critical care time)  Medications Ordered in UC Medications - No data to display  Initial Impression / Assessment and Plan / UC Course  I have reviewed the triage vital signs and the nursing notes.  Pertinent labs & imaging results that were available during my care of the patient were  reviewed by me and considered in my medical decision making (see chart for details).    69 year old female presents with musculoskeletal pain after a fall.  Multiple x-rays done today and were negative for fracture.  Advise rest, ice.  Naproxen as needed.  Final Clinical Impressions(s) / UC Diagnoses   Final diagnoses:  Musculoskeletal pain  Fall, initial encounter     Discharge Instructions     No fracture.  Medication as directed.  Rest, ice.  Take care  Dr. Lacinda Axon    ED Prescriptions    Medication Sig Dispense Auth. Provider   naproxen (NAPROSYN) 500 MG tablet Take 1 tablet (500 mg total) by mouth 2 (two) times daily as needed for mild pain or moderate pain. 30 tablet Coral Spikes, DO     PDMP not reviewed this encounter.   Coral Spikes, [place] 03/06/20 1830

## 2020-03-06 NOTE — ED Triage Notes (Addendum)
Pt slipped on wet kitchen floor, landing on her right side. Right low back pain, right elbow pain, right neck pain, right thigh pain, right knee pain.

## 2020-03-06 NOTE — Discharge Instructions (Signed)
No fracture.  Medication as directed.  Rest, ice.  Take care  Dr. Lacinda Axon

## 2020-04-19 ENCOUNTER — Ambulatory Visit: Payer: PPO

## 2020-05-08 ENCOUNTER — Other Ambulatory Visit: Payer: Self-pay | Admitting: Obstetrics and Gynecology

## 2020-05-08 DIAGNOSIS — Z1231 Encounter for screening mammogram for malignant neoplasm of breast: Secondary | ICD-10-CM

## 2020-05-16 ENCOUNTER — Other Ambulatory Visit: Payer: Self-pay

## 2020-05-16 ENCOUNTER — Ambulatory Visit (INDEPENDENT_AMBULATORY_CARE_PROVIDER_SITE_OTHER): Payer: PPO | Admitting: Internal Medicine

## 2020-05-16 ENCOUNTER — Encounter: Payer: Self-pay | Admitting: Internal Medicine

## 2020-05-16 ENCOUNTER — Ambulatory Visit (INDEPENDENT_AMBULATORY_CARE_PROVIDER_SITE_OTHER): Payer: PPO

## 2020-05-16 VITALS — BP 90/62 | HR 95 | Temp 97.4°F | Ht 66.5 in | Wt 181.2 lb

## 2020-05-16 DIAGNOSIS — F439 Reaction to severe stress, unspecified: Secondary | ICD-10-CM

## 2020-05-16 DIAGNOSIS — R0602 Shortness of breath: Secondary | ICD-10-CM

## 2020-05-16 DIAGNOSIS — Z0001 Encounter for general adult medical examination with abnormal findings: Secondary | ICD-10-CM | POA: Diagnosis not present

## 2020-05-16 DIAGNOSIS — J4520 Mild intermittent asthma, uncomplicated: Secondary | ICD-10-CM

## 2020-05-16 DIAGNOSIS — R079 Chest pain, unspecified: Secondary | ICD-10-CM | POA: Insufficient documentation

## 2020-05-16 DIAGNOSIS — E7800 Pure hypercholesterolemia, unspecified: Secondary | ICD-10-CM | POA: Diagnosis not present

## 2020-05-16 DIAGNOSIS — J329 Chronic sinusitis, unspecified: Secondary | ICD-10-CM

## 2020-05-16 DIAGNOSIS — J3489 Other specified disorders of nose and nasal sinuses: Secondary | ICD-10-CM

## 2020-05-16 DIAGNOSIS — Z0000 Encounter for general adult medical examination without abnormal findings: Secondary | ICD-10-CM

## 2020-05-16 IMAGING — DX DG CHEST 2V
2 series · 2 of 2 positions shown · non-contrast
Comparison: [DATE]

CLINICAL DATA: Shortness of breath

EXAM:
CHEST - 2 VIEW

[chest pa]
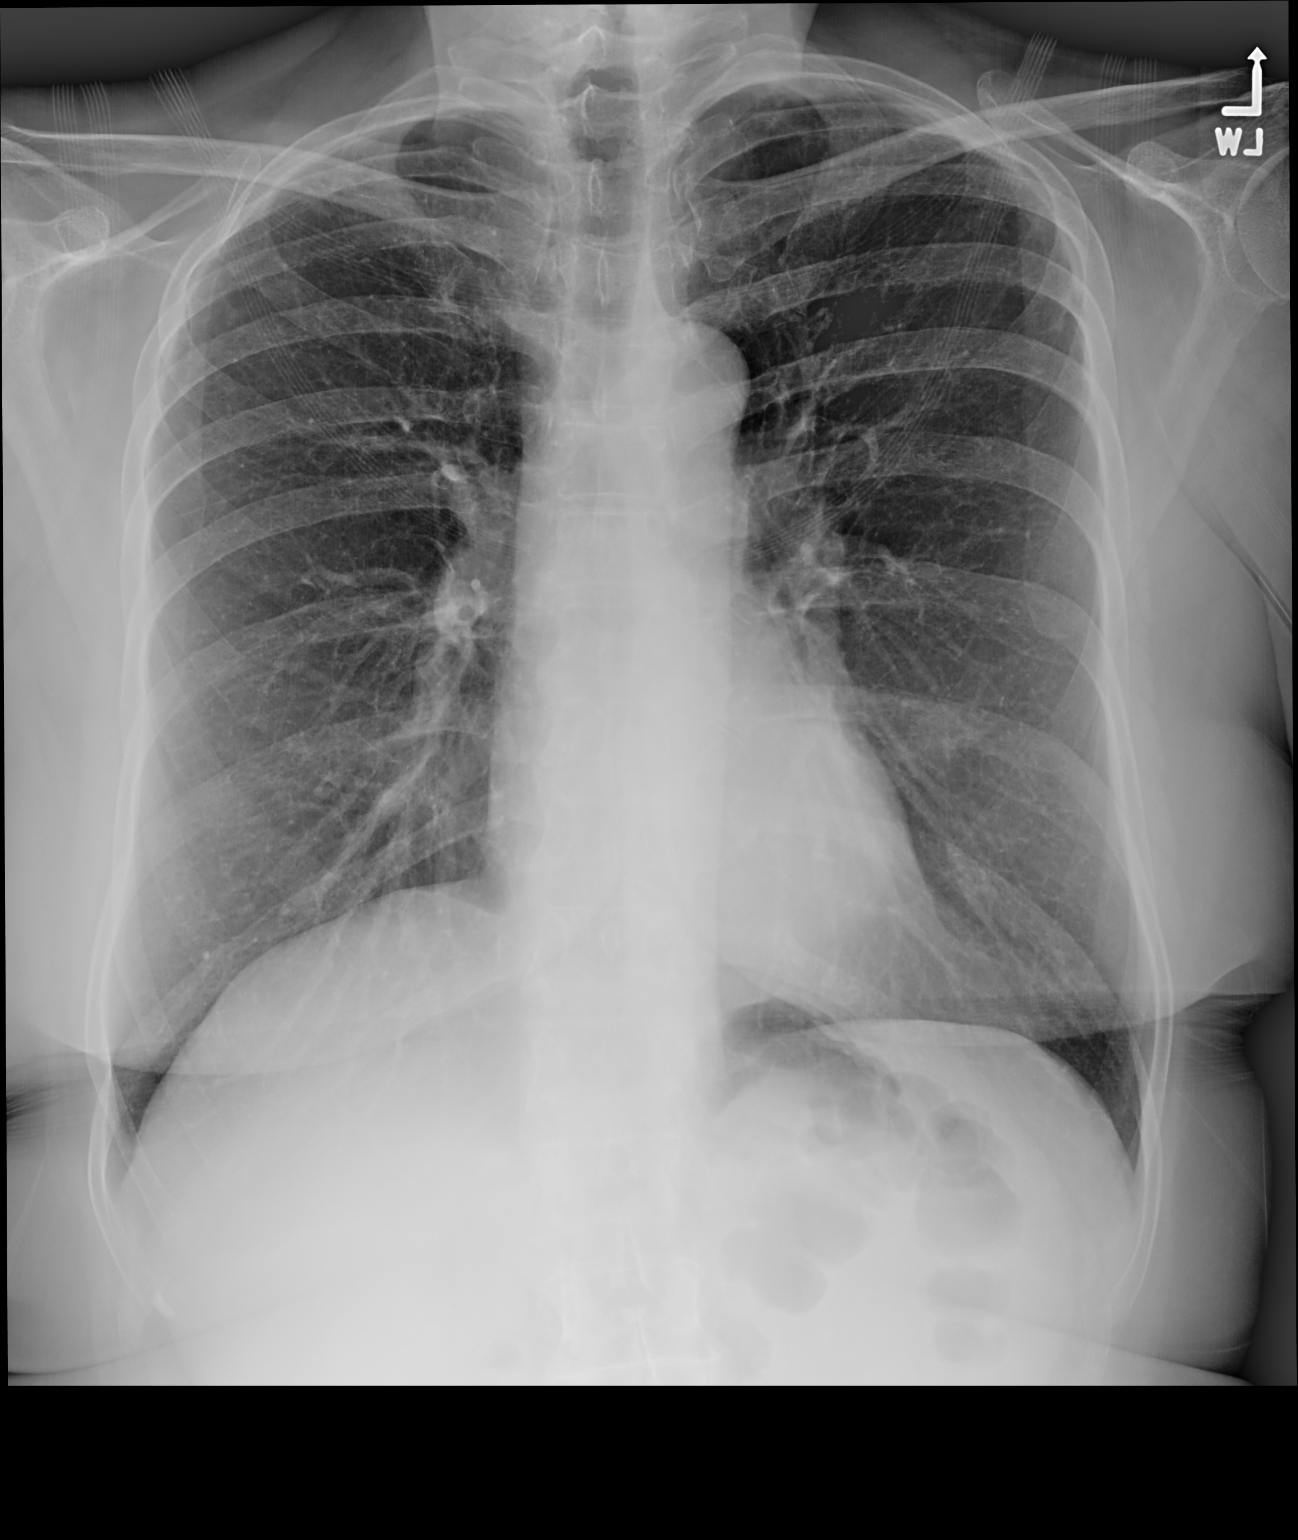

[chest lat]
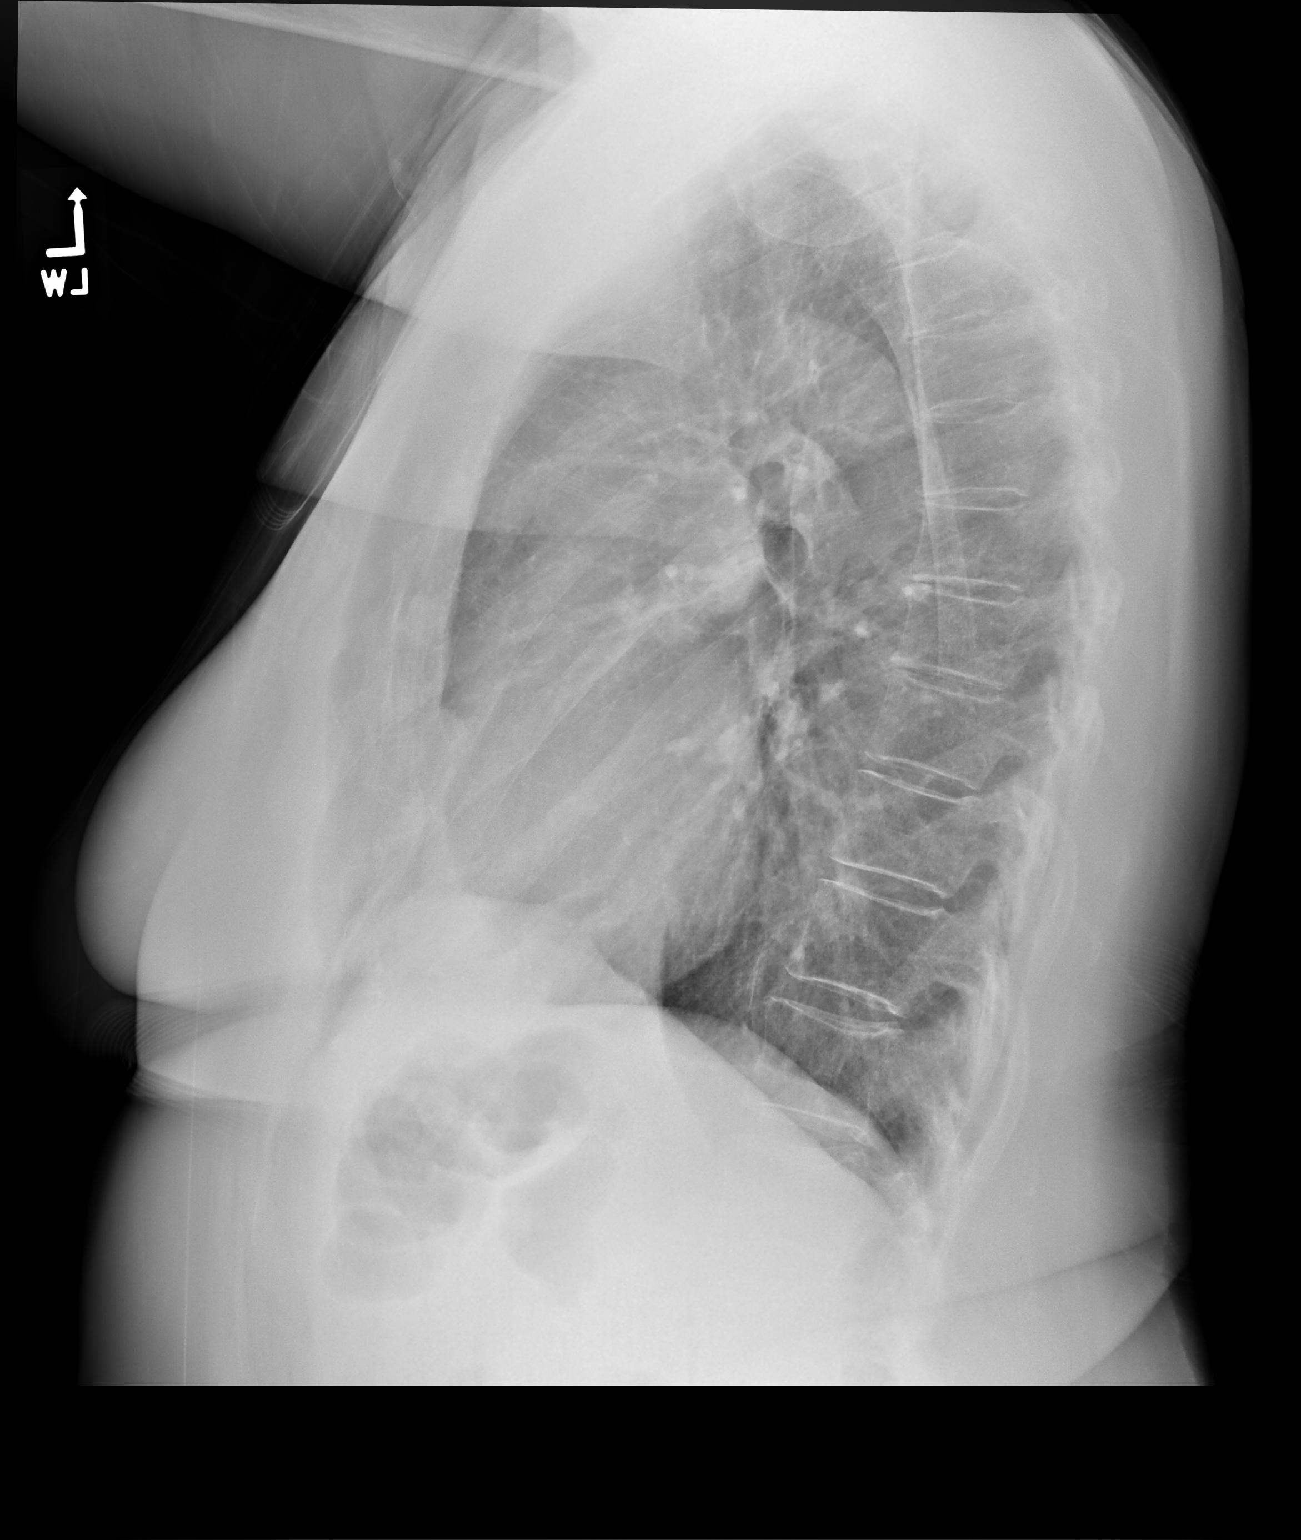

[2 of 2 positions shown; findings below may reference images not displayed]

FINDINGS: The heart size and mediastinal contours are within normal limits.
Both lungs are clear. The visualized skeletal structures are
unremarkable.
IMPRESSION: No active cardiopulmonary disease.

## 2020-05-16 MED ORDER — SERTRALINE HCL 25 MG PO TABS
25.0000 mg | ORAL_TABLET | Freq: Every day | ORAL | 1 refills | Status: DC
Start: 2020-05-16 — End: 2020-06-20

## 2020-05-16 NOTE — Patient Instructions (Signed)
Cardiology appt - 05/17/20 - 9:00 am

## 2020-05-16 NOTE — Assessment & Plan Note (Addendum)
Physical today 05/16/20.  PAP 07/15/16.  Mammogram 06/01/19 - birads I.  Colonoscopy 08/2017 - tubular adenoma.  Recommended f/u in 5 years.  Discussed vaccines.

## 2020-05-16 NOTE — Progress Notes (Signed)
Patient ID: Lynn Mann, female   DOB: 1950/12/18, 69 y.o.   MRN: [id number]   Subjective:    Patient ID: Lynn Mann, female    DOB: 1950-11-17, 69 y.o.   MRN: [id number]  HPI This visit occurred during the SARS-CoV-2 public health emergency.  Safety protocols were in place, including screening questions prior to the visit, additional usage of staff PPE, and extensive cleaning of exam room while observing appropriate contact time as indicated for disinfecting solutions.  Patient here for her physical exam.  Husband recently passed unexpectedly - 03/26/20.  Increased stress related to this.  Discussed with her today.  Has good support.  Does wish to see a counselor.  Has noticed chest pain and sob with exertion.  Worsened since her husband passed away.  Feet and ankles have been swelling more.  Increased fatigue.  She is eating.  No abdominal pain.  Some loose stool in the last 24 hours, but previously - bowels ok.  No fever.      Past Medical History:  Diagnosis Date  . Arthritis   . C. difficile diarrhea    age 57s-40s   . Chicken pox   . Cholecystitis 11/2011   Did not require sgy - Dr. Staci Acosta - [facility]  (cholelithiasis)  . H/O Clostridium difficile infection   . IBS (irritable bowel syndrome)   . MRSA exposure 2005   Spider bite  . MVP (mitral valve prolapse)    Stable - Dr. Ubaldo Glassing  . Rheumatic fever    Past Surgical History:  Procedure Laterality Date  . CATARACT EXTRACTION  [id number]  . MUSCLE BIOPSY     Family History  Problem Relation Age of Onset  . Arthritis Mother   . Stroke Mother   . Diabetes Mother   . Hypertension Mother   . Arthritis Father   . Stroke Father   . Diabetes Paternal Grandmother   . Cancer Paternal Uncle        colon  . Heart disease Other        maternal and paternal side  . Breast cancer Paternal Aunt   . Breast cancer Cousin   . Breast cancer Cousin        female cousin  . Breast cancer Cousin    Social History   Socioeconomic  History  . Marital status: Married    Spouse name: Not on file  . Number of children: Not on file  . Years of education: 43  . Highest education level: Not on file  Occupational History  . Occupation: Retired  Tobacco Use  . Smoking status: Never Smoker  . Smokeless tobacco: Never Used  Vaping Use  . Vaping Use: Never used  Substance and Sexual Activity  . Alcohol use: Yes    Alcohol/week: 0.0 standard drinks    Comment: Rarely  . Drug use: No  . Sexual activity: Yes    Partners: Male    Birth control/protection: Post-menopausal  Other Topics Concern  . Not on file  Social History Narrative  . Not on file   Social Determinants of Health   Financial Resource Strain:   . Difficulty of Paying Living Expenses: Not on file  Food Insecurity:   . Worried About Charity fundraiser in the Last Year: Not on file  . Ran Out of Food in the Last Year: Not on file  Transportation Needs:   . Lack of Transportation (Medical): Not on file  . Lack of Transportation (Non-Medical):  Not on file  Physical Activity:   . Days of Exercise per Week: Not on file  . Minutes of Exercise per Session: Not on file  Stress:   . Feeling of Stress : Not on file  Social Connections:   . Frequency of Communication with Friends and Family: Not on file  . Frequency of Social Gatherings with Friends and Family: Not on file  . Attends Religious Services: Not on file  . Active Member of Clubs or Organizations: Not on file  . Attends Archivist Meetings: Not on file  . Marital Status: Not on file    Outpatient Encounter Medications as of 05/16/2020  Medication Sig  . albuterol (PROVENTIL HFA;VENTOLIN HFA) 108 (90 Base) MCG/ACT inhaler Inhale 2 puffs into the lungs every 6 (six) hours as needed for wheezing or shortness of breath.  Marland Kitchen aspirin 325 MG tablet Take by mouth.  Marland Kitchen CALCIUM PO Take by mouth. (Patient not taking: Reported on 05/16/2020)  . cholecalciferol (VITAMIN D3) 25 MCG (1000 UT)  tablet Take 1,000 Units by mouth once a week. (Patient not taking: Reported on 05/16/2020)  . sertraline (ZOLOFT) 25 MG tablet Take 1 tablet (25 mg total) by mouth daily.  . [DISCONTINUED] ANUCORT-HC 25 MG suppository UNW AND I 1 SUP REC BID (Patient not taking: Reported on 05/16/2020)  . [DISCONTINUED] Ascorbic Acid (VITAMIN C PO) Take by mouth. (Patient not taking: Reported on 05/16/2020)  . [DISCONTINUED] bisacodyl (DULCOLAX) 5 MG EC tablet Take 5 mg by mouth daily as needed for moderate constipation. (Patient not taking: Reported on 05/16/2020)  . [DISCONTINUED] fluticasone (FLONASE) 50 MCG/ACT nasal spray SHAKE LIQUID AND USE 1 SPRAY IN EACH NOSTRIL TWICE DAILY (Patient not taking: Reported on 05/16/2020)  . [DISCONTINUED] Multiple Vitamins-Minerals (PRESERVISION AREDS PO) Take by mouth. (Patient not taking: Reported on 05/16/2020)  . [DISCONTINUED] multivitamin-iron-minerals-folic acid (CENTRUM) chewable tablet Chew 1 tablet by mouth daily. (Patient not taking: Reported on 05/16/2020)  . [DISCONTINUED] naproxen (NAPROSYN) 500 MG tablet Take 1 tablet (500 mg total) by mouth 2 (two) times daily as needed for mild pain or moderate pain. (Patient not taking: Reported on 05/16/2020)  . [DISCONTINUED] Polyethyl Glycol-Propyl Glycol (SYSTANE OP) Apply to eye. (Patient not taking: Reported on 05/16/2020)  . [DISCONTINUED] senna (SENOKOT) 8.6 MG TABS tablet Take 1 tablet by mouth daily. (Patient not taking: Reported on 05/16/2020)  . [DISCONTINUED] sodium chloride (OCEAN) 0.65 % SOLN nasal spray Place 1 spray into both nostrils as needed for congestion. (Patient not taking: Reported on 05/16/2020)   No facility-administered encounter medications on file as of 05/16/2020.    Review of Systems  Constitutional: Negative for appetite change and unexpected weight change.  HENT: Negative for congestion, sinus pressure and sore throat.   Eyes: Negative for pain and visual disturbance.  Respiratory:  Positive for shortness of breath. Negative for cough.   Cardiovascular: Positive for chest pain. Negative for palpitations.       Feet and ankle swelling.   Gastrointestinal: Negative for abdominal pain, nausea and vomiting.       Loose stool in the last 24 hours.   Genitourinary: Negative for difficulty urinating and dysuria.  Musculoskeletal: Negative for back pain and joint swelling.  Skin: Negative for color change and rash.  Neurological: Negative for dizziness, light-headedness and headaches.  Hematological: Negative for adenopathy. Does not bruise/bleed easily.  Psychiatric/Behavioral: Negative for agitation and dysphoric mood.       Increased stress as outlined.  Objective:    Physical Exam Vitals reviewed.  Constitutional:      General: She is not in acute distress.    Appearance: Normal appearance. She is well-developed.  HENT:     Head: Normocephalic and atraumatic.     Right Ear: External ear normal.     Left Ear: External ear normal.  Eyes:     General: No scleral icterus.       Right eye: No discharge.        Left eye: No discharge.     Conjunctiva/sclera: Conjunctivae normal.  Neck:     Thyroid: No thyromegaly.  Cardiovascular:     Rate and Rhythm: Normal rate and regular rhythm.  Pulmonary:     Effort: No tachypnea, accessory muscle usage or respiratory distress.     Breath sounds: Normal breath sounds. No decreased breath sounds or wheezing.  Chest:     Breasts:        Right: No inverted nipple, mass, nipple discharge or tenderness (no axillary adenopathy).        Left: No inverted nipple, mass, nipple discharge or tenderness (no axilarry adenopathy).  Abdominal:     General: Bowel sounds are normal.     Palpations: Abdomen is soft.     Tenderness: There is no abdominal tenderness.  Musculoskeletal:        General: No tenderness.     Cervical back: Neck supple. No tenderness.     Comments: No increased swelling - lower extremities.     Lymphadenopathy:     Cervical: No cervical adenopathy.  Skin:    Findings: No erythema or rash.  Neurological:     Mental Status: She is alert and oriented to person, place, and time.  Psychiatric:        Mood and Affect: Mood normal.        Behavior: Behavior normal.     BP 90/62   Pulse 95   Temp (!) 97.4 F (36.3 C)   Ht 5' 6.5" (1.689 m)   Wt 181 lb 3.2 oz (82.2 kg)   SpO2 96%   BMI 28.81 kg/m  Wt Readings from Last 3 Encounters:  05/16/20 181 lb 3.2 oz (82.2 kg)  03/06/20 187 lb 6.3 oz (85 kg)  12/02/19 164 lb 3.2 oz (74.5 kg)     Lab Results  Component Value Date   WBC 6.0 06/17/2019   HGB 13.1 06/17/2019   HCT 39.5 06/17/2019   PLT 262.0 06/17/2019   GLUCOSE 78 06/17/2019   CHOL 226 (H) 06/17/2019   TRIG 129.0 06/17/2019   HDL 52.70 06/17/2019   LDLDIRECT 110.0 02/11/2017   LDLCALC 147 (H) 06/17/2019   ALT 15 06/17/2019   AST 20 06/17/2019   NA 138 06/17/2019   K 4.0 06/17/2019   CL 102 06/17/2019   CREATININE 0.64 06/17/2019   BUN 21 06/17/2019   CO2 27 06/17/2019   TSH 3.37 06/17/2019   HGBA1C 5.4 07/15/2017    DG Cervical Spine Complete  Result Date: 03/06/2020 CLINICAL DATA:  69 year old female with fall. EXAM: CERVICAL SPINE - COMPLETE 4+ VIEW COMPARISON:  Cervical spine radiograph dated 12/26/2015. FINDINGS: There is no acute fracture or subluxation of the cervical spine. There are multilevel degenerative changes with multilevel facet arthropathy. The visualized posterior elements and odontoid appear intact. There is anatomic alignment of the lateral masses of C1 and C2. The soft tissues are unremarkable. IMPRESSION: No acute/traumatic cervical spine pathology. Electronically Signed   By: Anner Crete  M.D.   On: 03/06/2020 16:47   DG Lumbar Spine Complete  Result Date: 03/06/2020 CLINICAL DATA:  69 year old female with fall. EXAM: LUMBAR SPINE - COMPLETE 4+ VIEW COMPARISON:  CT abdomen pelvis dated 06/17/2018. FINDINGS: Five lumbar type  vertebra. There is no acute fracture or subluxation of the lumbar spine. Degenerative changes with grade 1 L4-L5 anterolisthesis. Lower lumbar facet arthropathy. There is atherosclerotic calcification of the abdominal aorta. IMPRESSION: No acute/traumatic lumbar spine pathology. Electronically Signed   By: Anner Crete M.D.   On: 03/06/2020 16:45   DG Clavicle Right  Result Date: 03/06/2020 CLINICAL DATA:  69 year old female with fall. EXAM: RIGHT CLAVICLE - 2+ VIEWS COMPARISON:  Chest radiograph dated 10/24/2017. FINDINGS: There is no evidence of fracture or other focal bone lesions. Soft tissues are unremarkable. IMPRESSION: Negative. Electronically Signed   By: Anner Crete M.D.   On: 03/06/2020 16:41   DG Knee Complete 4 Views Right  Result Date: 03/06/2020 CLINICAL DATA:  69 year old female with fall and trauma to the right lower extremity. EXAM: RIGHT KNEE - COMPLETE 4+ VIEW; DG HIP (WITH OR WITHOUT PELVIS) 2-3V RIGHT COMPARISON:  None. FINDINGS: There is no acute fracture or dislocation. No significant arthritic changes. The soft tissues are unremarkable. IMPRESSION: Negative. Electronically Signed   By: Anner Crete M.D.   On: 03/06/2020 16:43   DG Hip Unilat W or Wo Pelvis 2-3 Views Right  Result Date: 03/06/2020 CLINICAL DATA:  69 year old female with fall and trauma to the right lower extremity. EXAM: RIGHT KNEE - COMPLETE 4+ VIEW; DG HIP (WITH OR WITHOUT PELVIS) 2-3V RIGHT COMPARISON:  None. FINDINGS: There is no acute fracture or dislocation. No significant arthritic changes. The soft tissues are unremarkable. IMPRESSION: Negative. Electronically Signed   By: Anner Crete M.D.   On: 03/06/2020 16:43       Assessment & Plan:   Problem List Items Addressed This Visit    Stress    Increased stress and anxiety as outlined.  Discussed with her today. Discussed treatment options.  Agreeable to counseling.  List of counselor names given.  Start zoloft 56m q day.  Follow  closely.        SOB (shortness of breath) - Primary    Has noticed sob with exertion. Some chest discomfort. EKG  - SR with no acute ischemic changes.  Given persistent symptoms and worsening sob with exertion, discussed further cardiac evaluation.  She is agreeable.  Also, will check cxr.  Albuterol inhaler if needed.  Check labs, including cbc, met c and tsh.  Treat anxiety/stress.        Relevant Orders   DG Chest 2 View   CBC with Differential/Platelet   Comprehensive metabolic panel   TSH   Hypercholesterolemia    Low cholesterol diet and exercise.  Follow lipid panel.  Check today.       Relevant Orders   Lipid panel   Hyperbilirubinemia    Recheck liver panel.       Health care maintenance    Physical today 05/16/20.  PAP 07/15/16.  Mammogram 06/01/19 - birads I.  Colonoscopy 08/2017 - tubular adenoma.  Recommended f/u in 5 years.  Discussed vaccines.        Chest pain    Chest pain as outlined.  EKG - SR with no acute ischemic changes.  Discussed further cardiac w/up/  appt made.  Sees Dr FUbaldo Glassing  Treat anxiety.  Check cxr as outlined.       Relevant Orders  EKG 12-Lead (Completed)   Asthma    With sob with exertion, check cxr.  Pursue cardiac w/up as outlined.  No increased cough or congestion. Albuterol inhaler if needed.  Follow.        Other Visit Diagnoses    Sinusitis, unspecified chronicity, unspecified location       Nasal discharge          I spent 45 minutes with the patient and more than 50% of the time was spent in consultation regarding the above.  Time spent discussing her current symptoms and concerns.  Time also spent discussing treatment and plans for further evaluation.    Einar Pheasant, MD

## 2020-05-17 ENCOUNTER — Encounter: Payer: Self-pay | Admitting: Internal Medicine

## 2020-05-17 DIAGNOSIS — E7800 Pure hypercholesterolemia, unspecified: Secondary | ICD-10-CM | POA: Diagnosis not present

## 2020-05-17 DIAGNOSIS — F439 Reaction to severe stress, unspecified: Secondary | ICD-10-CM | POA: Insufficient documentation

## 2020-05-17 DIAGNOSIS — R0602 Shortness of breath: Secondary | ICD-10-CM | POA: Diagnosis not present

## 2020-05-17 DIAGNOSIS — I341 Nonrheumatic mitral (valve) prolapse: Secondary | ICD-10-CM | POA: Diagnosis not present

## 2020-05-17 DIAGNOSIS — R42 Dizziness and giddiness: Secondary | ICD-10-CM | POA: Diagnosis not present

## 2020-05-17 DIAGNOSIS — R079 Chest pain, unspecified: Secondary | ICD-10-CM | POA: Diagnosis not present

## 2020-05-17 DIAGNOSIS — Z8679 Personal history of other diseases of the circulatory system: Secondary | ICD-10-CM | POA: Diagnosis not present

## 2020-05-17 LAB — LIPID PANEL
Cholesterol: 195 mg/dL (ref 0–200)
HDL: 46.3 mg/dL (ref >39.00)
LDL Cholesterol: 127 mg/dL — ABNORMAL HIGH (ref 0–99)
NonHDL: 148.59
Total CHOL/HDL Ratio: 4
Triglycerides: 107 mg/dL (ref 0.0–149.0)
VLDL: 21.4 mg/dL (ref 0.0–40.0)

## 2020-05-17 LAB — COMPREHENSIVE METABOLIC PANEL
ALT: 14 U/L (ref 0–35)
AST: 18 U/L (ref 0–37)
Albumin: 4.5 g/dL (ref 3.5–5.2)
Alkaline Phosphatase: 57 U/L (ref 39–117)
BUN: 11 mg/dL (ref 6–23)
CO2: 28 mEq/L (ref 19–32)
Calcium: 9.6 mg/dL (ref 8.4–10.5)
Chloride: 103 mEq/L (ref 96–112)
Creatinine, Ser: 0.71 mg/dL (ref 0.40–1.20)
GFR: 87.71 mL/min (ref >60.00)
Glucose, Bld: 80 mg/dL (ref 70–99)
Potassium: 4.1 mEq/L (ref 3.5–5.1)
Sodium: 140 mEq/L (ref 135–145)
Total Bilirubin: 2 mg/dL — ABNORMAL HIGH (ref 0.2–1.2)
Total Protein: 6.8 g/dL (ref 6.0–8.3)

## 2020-05-17 LAB — CBC WITH DIFFERENTIAL/PLATELET
Basophils Absolute: 0 10*3/uL (ref 0.0–0.1)
Basophils Relative: 0.7 % (ref 0.0–3.0)
Eosinophils Absolute: 0 10*3/uL (ref 0.0–0.7)
Eosinophils Relative: 0.1 % (ref 0.0–5.0)
HCT: 40.7 % (ref 36.0–46.0)
Hemoglobin: 14.1 g/dL (ref 12.0–15.0)
Lymphocytes Relative: 25.2 % (ref 12.0–46.0)
Lymphs Abs: 1.6 10*3/uL (ref 0.7–4.0)
MCHC: 34.7 g/dL (ref 30.0–36.0)
MCV: 86.5 fl (ref 78.0–100.0)
Monocytes Absolute: 0.4 10*3/uL (ref 0.1–1.0)
Monocytes Relative: 6.4 % (ref 3.0–12.0)
Neutro Abs: 4.3 10*3/uL (ref 1.4–7.7)
Neutrophils Relative %: 67.6 % (ref 43.0–77.0)
Platelets: 294 10*3/uL (ref 150.0–400.0)
RBC: 4.71 Mil/uL (ref 3.87–5.11)
RDW: 14.4 % (ref 11.5–15.5)
WBC: 6.3 10*3/uL (ref 4.0–10.5)

## 2020-05-17 LAB — TSH: TSH: 2.48 u[IU]/mL (ref 0.35–4.50)

## 2020-05-17 NOTE — Assessment & Plan Note (Signed)
Increased stress and anxiety as outlined.  Discussed with her today. Discussed treatment options.  Agreeable to counseling.  List of counselor names given.  Start zoloft 25mg  q day.  Follow closely.

## 2020-05-17 NOTE — Assessment & Plan Note (Signed)
Has noticed sob with exertion. Some chest discomfort. EKG  - SR with no acute ischemic changes.  Given persistent symptoms and worsening sob with exertion, discussed further cardiac evaluation.  She is agreeable.  Also, will check cxr.  Albuterol inhaler if needed.  Check labs, including cbc, met c and tsh.  Treat anxiety/stress.

## 2020-05-17 NOTE — Assessment & Plan Note (Signed)
Chest pain as outlined.  EKG - SR with no acute ischemic changes.  Discussed further cardiac w/up/  appt made.  Sees Dr Ubaldo Glassing.  Treat anxiety.  Check cxr as outlined.

## 2020-05-17 NOTE — Assessment & Plan Note (Signed)
With sob with exertion, check cxr.  Pursue cardiac w/up as outlined.  No increased cough or congestion. Albuterol inhaler if needed.  Follow.

## 2020-05-17 NOTE — Assessment & Plan Note (Signed)
Low cholesterol diet and exercise. Follow lipid panel.  Check today.  ?

## 2020-05-17 NOTE — Assessment & Plan Note (Signed)
Recheck liver panel.  

## 2020-05-20 ENCOUNTER — Other Ambulatory Visit: Payer: Self-pay | Admitting: Internal Medicine

## 2020-05-20 NOTE — Progress Notes (Signed)
Order placed for f/u liver panel.  

## 2020-05-24 ENCOUNTER — Other Ambulatory Visit: Payer: Self-pay

## 2020-05-24 ENCOUNTER — Other Ambulatory Visit (INDEPENDENT_AMBULATORY_CARE_PROVIDER_SITE_OTHER): Payer: PPO

## 2020-05-24 LAB — HEPATIC FUNCTION PANEL
ALT: 15 U/L (ref 0–35)
AST: 19 U/L (ref 0–37)
Albumin: 4.3 g/dL (ref 3.5–5.2)
Alkaline Phosphatase: 50 U/L (ref 39–117)
Bilirubin, Direct: 0.2 mg/dL (ref 0.0–0.3)
Total Bilirubin: 1.6 mg/dL — ABNORMAL HIGH (ref 0.2–1.2)
Total Protein: 6.6 g/dL (ref 6.0–8.3)

## 2020-05-27 ENCOUNTER — Other Ambulatory Visit: Payer: Self-pay | Admitting: Internal Medicine

## 2020-05-27 NOTE — Progress Notes (Signed)
Order placed for abdominal ultrasound.   

## 2020-06-02 DIAGNOSIS — R079 Chest pain, unspecified: Secondary | ICD-10-CM | POA: Diagnosis not present

## 2020-06-05 ENCOUNTER — Ambulatory Visit
Admission: RE | Admit: 2020-06-05 | Discharge: 2020-06-05 | Disposition: A | Discharge status: Home / Self Care | Payer: PPO | Source: Ambulatory Visit | Attending: Obstetrics and Gynecology | Admitting: Obstetrics and Gynecology

## 2020-06-05 ENCOUNTER — Other Ambulatory Visit: Payer: Self-pay

## 2020-06-05 DIAGNOSIS — Z1231 Encounter for screening mammogram for malignant neoplasm of breast: Secondary | ICD-10-CM | POA: Insufficient documentation

## 2020-06-05 IMAGING — MG DIGITAL SCREENING BILAT W/ TOMO W/ CAD
8 series · 8 of 24 positions shown · non-contrast
Comparison: Previous exam(s).

CLINICAL DATA: Screening.

EXAM:
DIGITAL SCREENING BILATERAL MAMMOGRAM WITH TOMO AND CAD

[R MLO synth-2D]
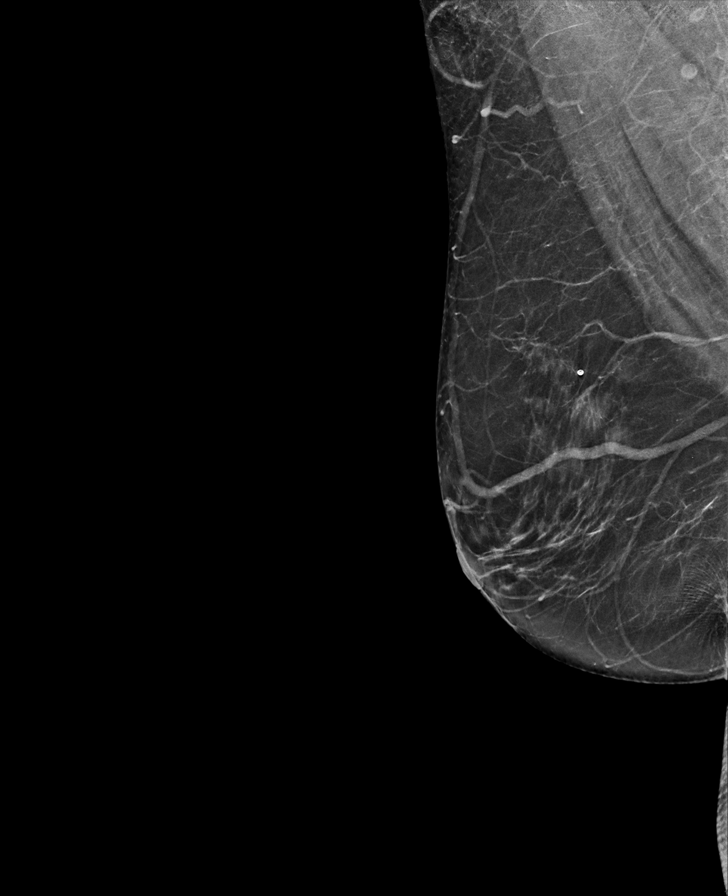

[L CC synth-2D]
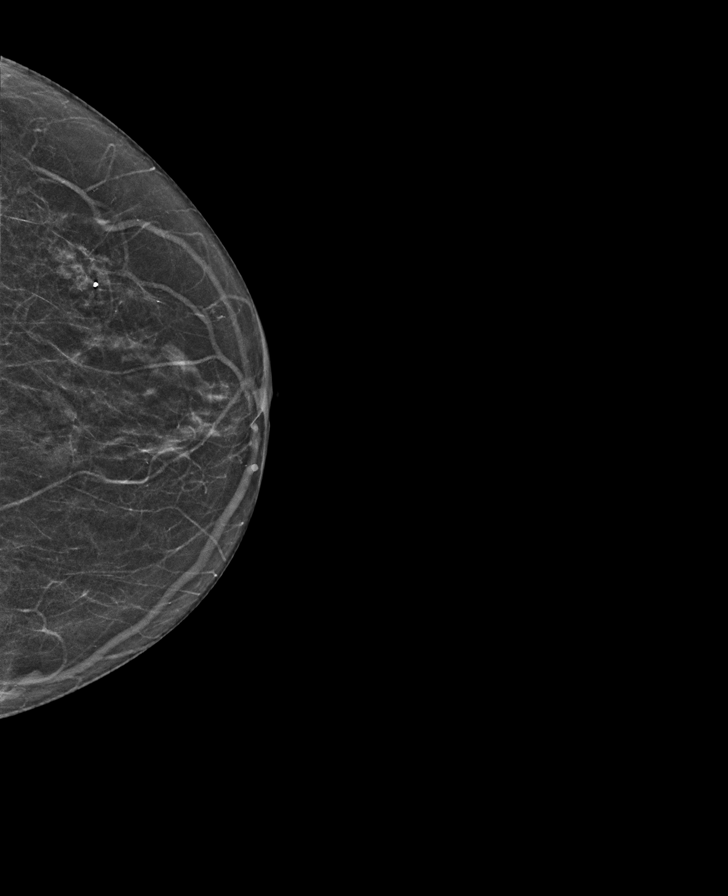

[R CC synth-2D]
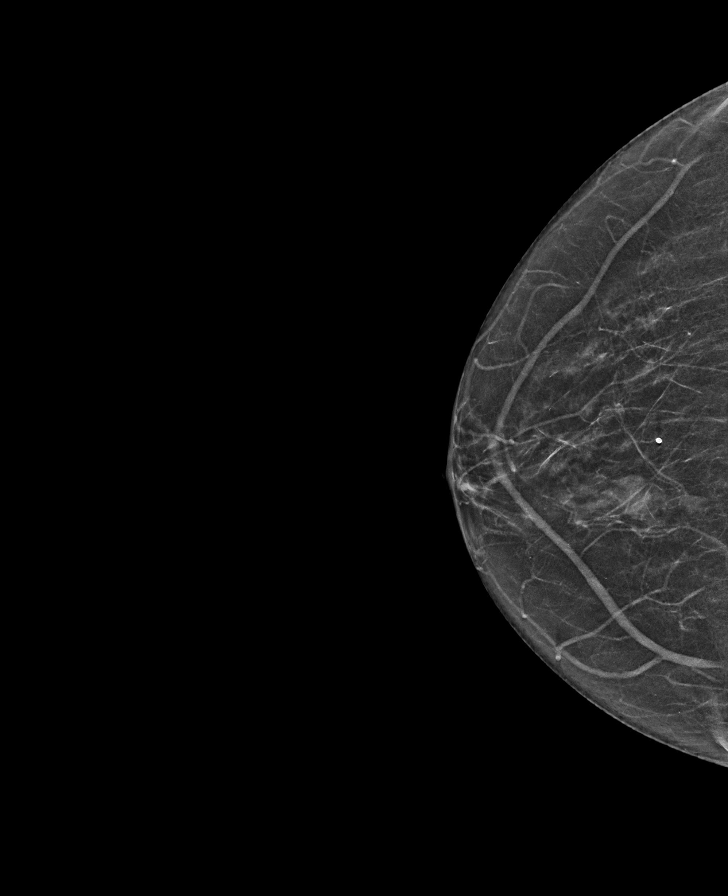

[L MLO synth-2D]
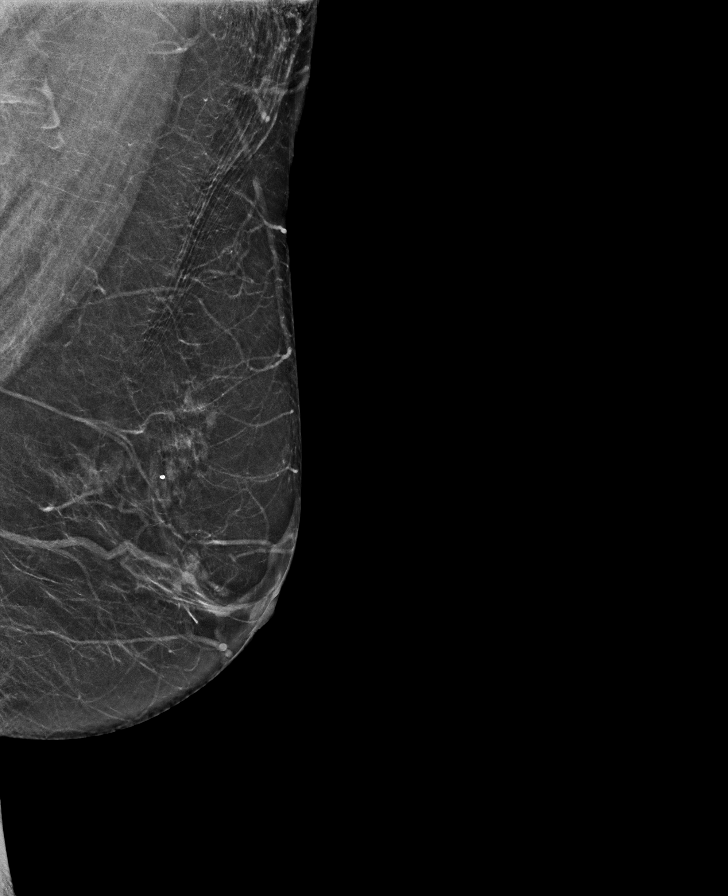

[L CC tomo · tomo slice 26/51.0]
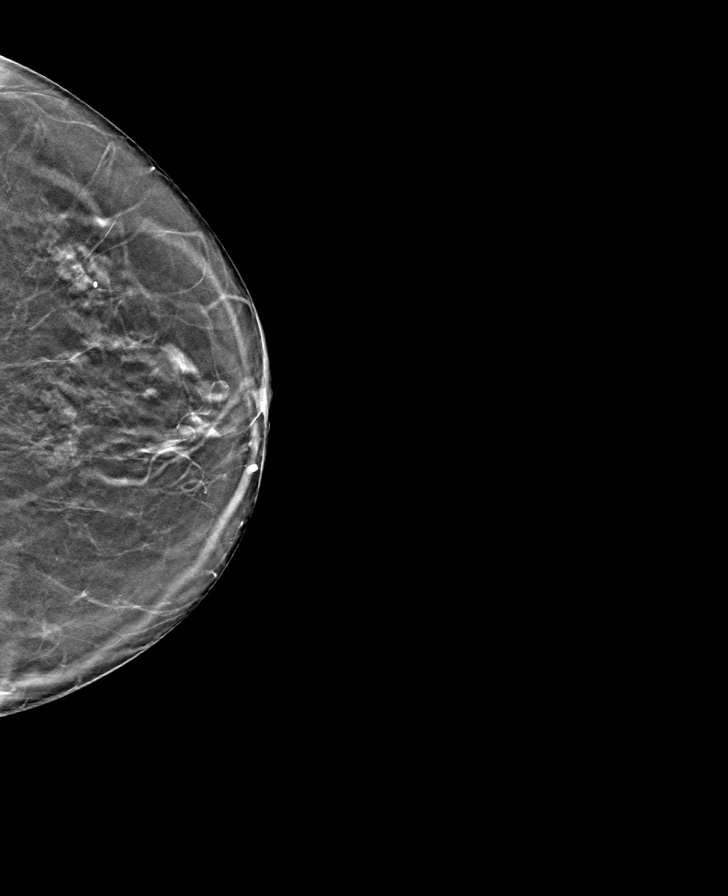

[R CC tomo · tomo slice 27/53.0]
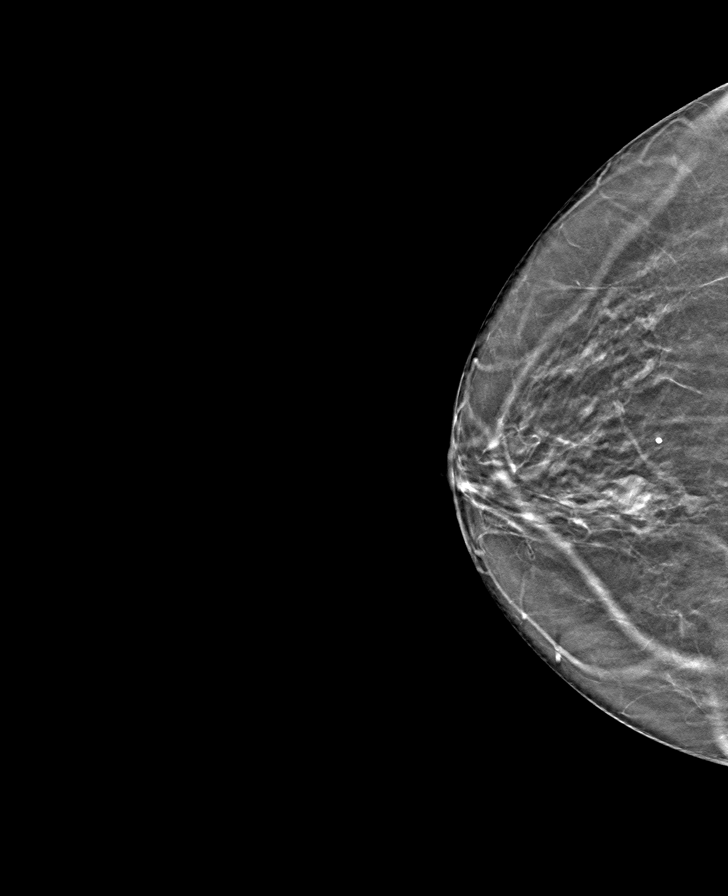

[L MLO tomo · tomo slice 31/62.0]
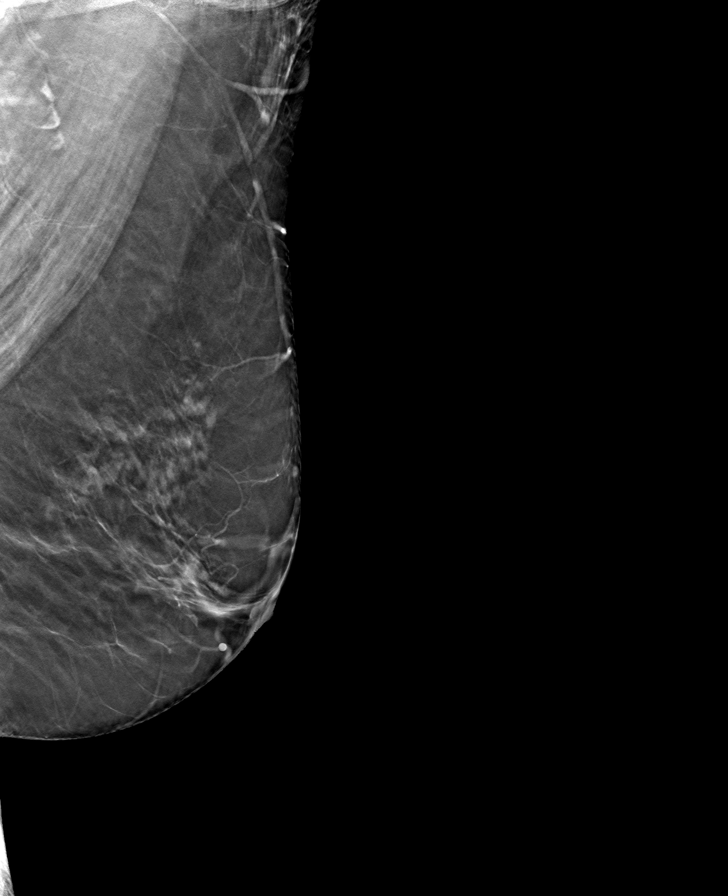

[R MLO tomo · tomo slice 33/65.0]
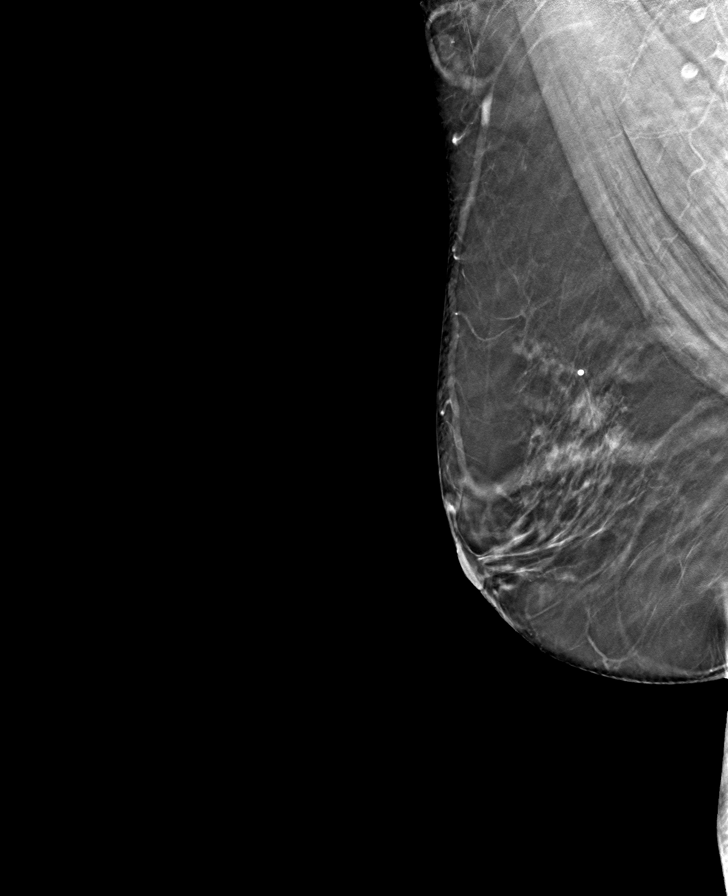

[8 of 24 positions shown; findings below may reference images not displayed]

ACR Breast Density Category b: There are scattered areas of
fibroglandular density.
FINDINGS: There are no findings suspicious for malignancy. Images were
processed with CAD.
IMPRESSION: No mammographic evidence of malignancy. A result letter of this
screening mammogram will be mailed directly to the patient.

RECOMMENDATION:
Screening mammogram in one year. (Code:[TQ])

BI-RADS CATEGORY  1: Negative.

## 2020-06-07 ENCOUNTER — Ambulatory Visit
Admission: RE | Admit: 2020-06-07 | Discharge: 2020-06-07 | Disposition: A | Discharge status: Home / Self Care | Payer: PPO | Source: Ambulatory Visit | Attending: Internal Medicine | Admitting: Internal Medicine

## 2020-06-07 ENCOUNTER — Other Ambulatory Visit: Payer: Self-pay

## 2020-06-07 DIAGNOSIS — K8020 Calculus of gallbladder without cholecystitis without obstruction: Secondary | ICD-10-CM | POA: Diagnosis not present

## 2020-06-07 IMAGING — US US ABDOMEN COMPLETE
1 series · 14 of 25 positions shown · non-contrast
Comparison: None.

CLINICAL DATA: Elevated bilirubin

EXAM:
ABDOMEN ULTRASOUND COMPLETE

[Series 1: us abdomen complete · 14 of 112 slices shown]
[im 1/112]
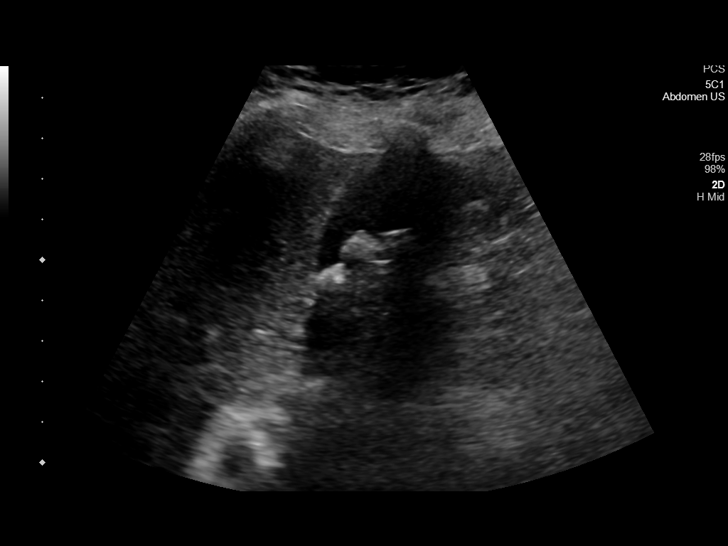
[im 10/112]
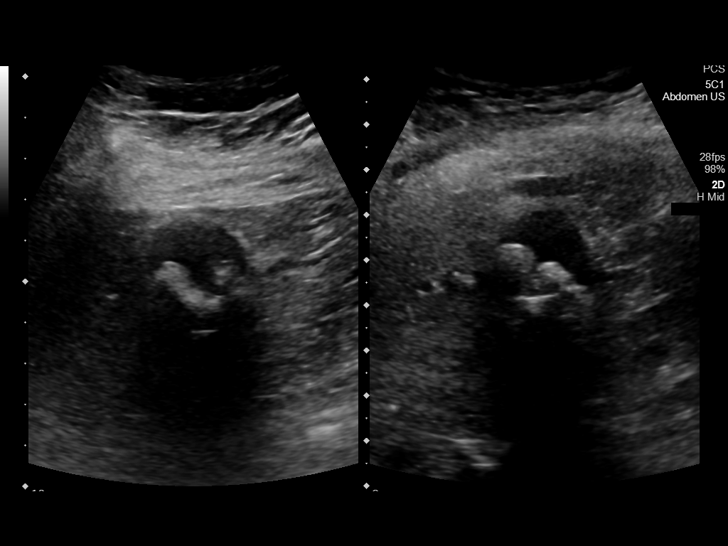
[im 19/112]
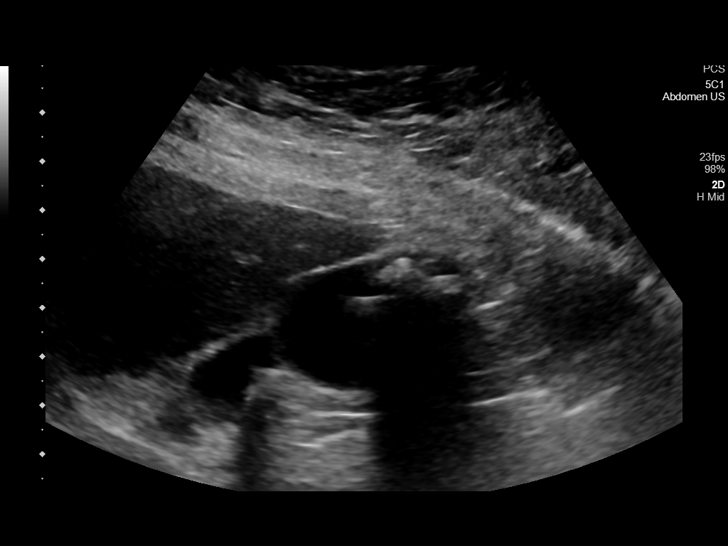
[im 28/112]
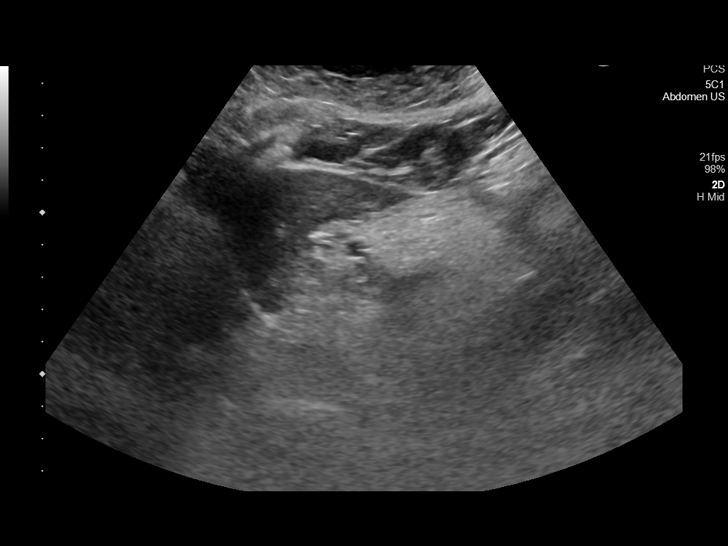
[im 38/112]
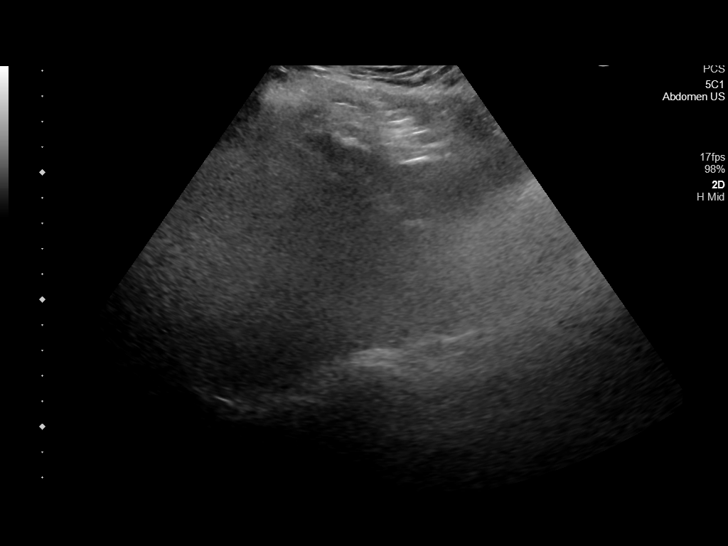
[im 42/112]
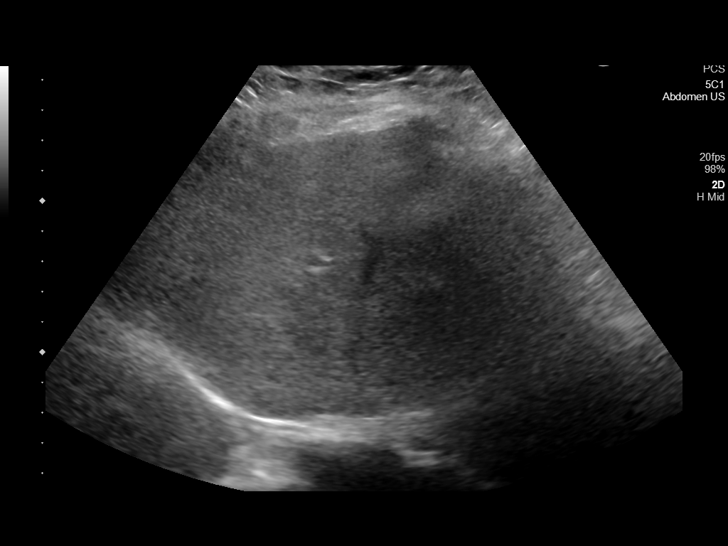
[im 51/112]
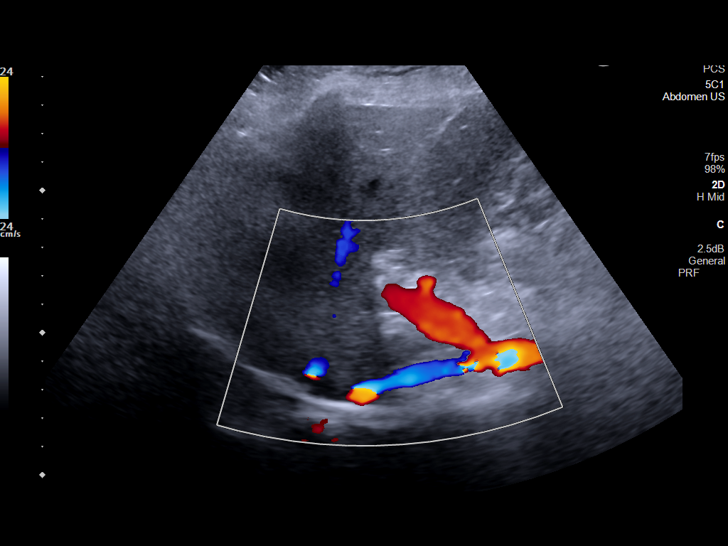
[im 61/112]
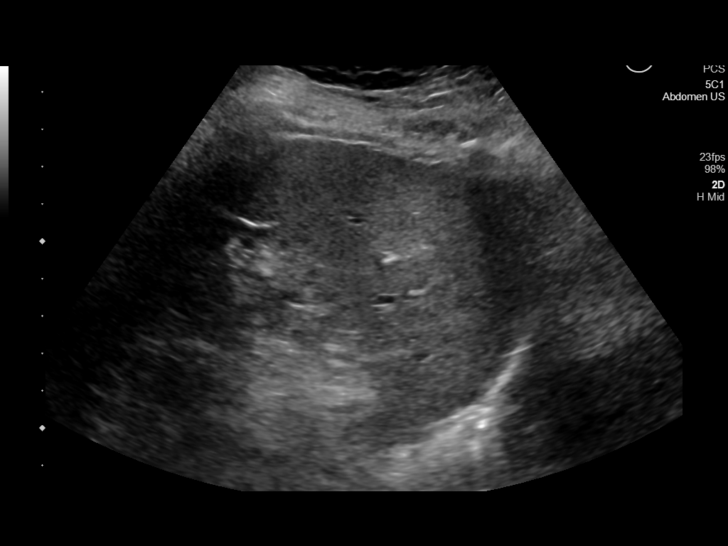
[im 70/112]
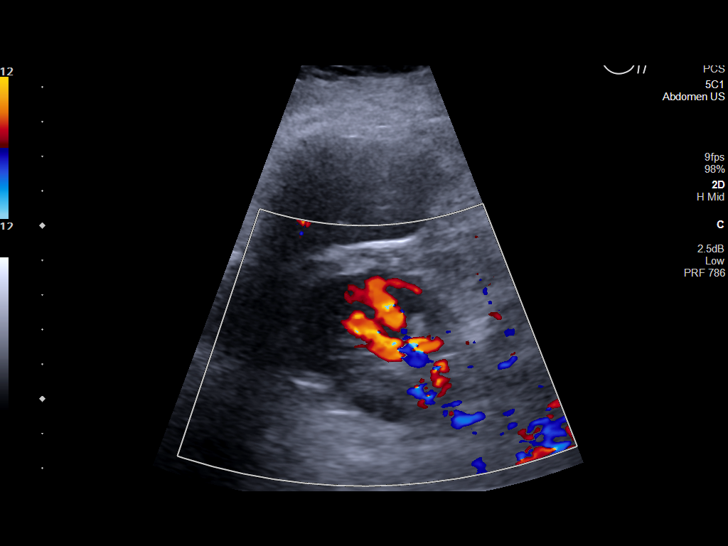
[im 75/112]
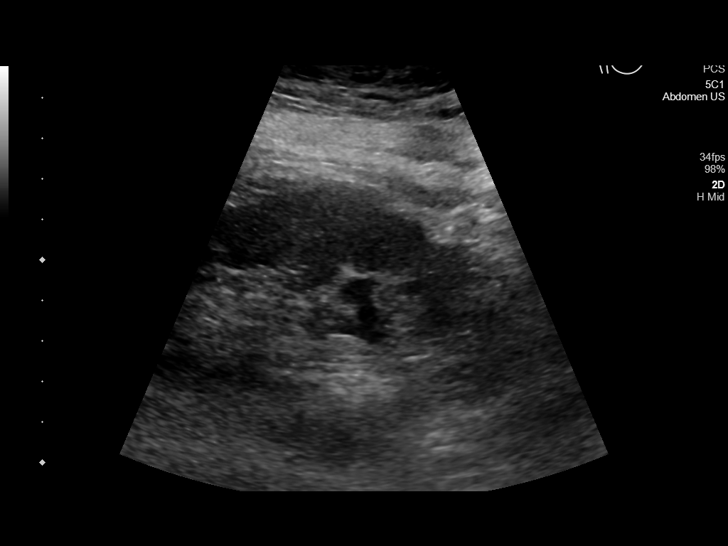
[im 84/112]
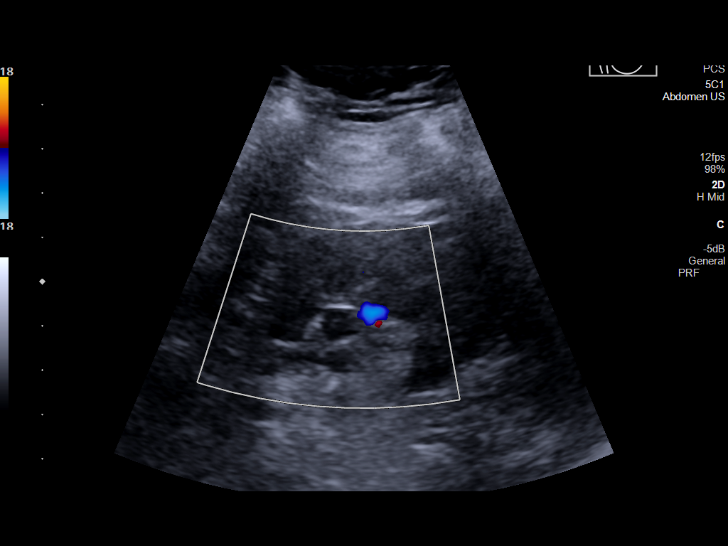
[im 93/112]
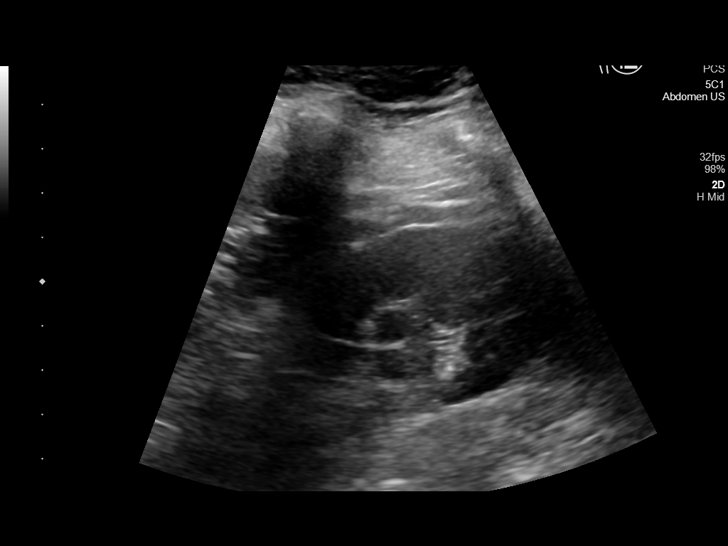
[im 102/112]
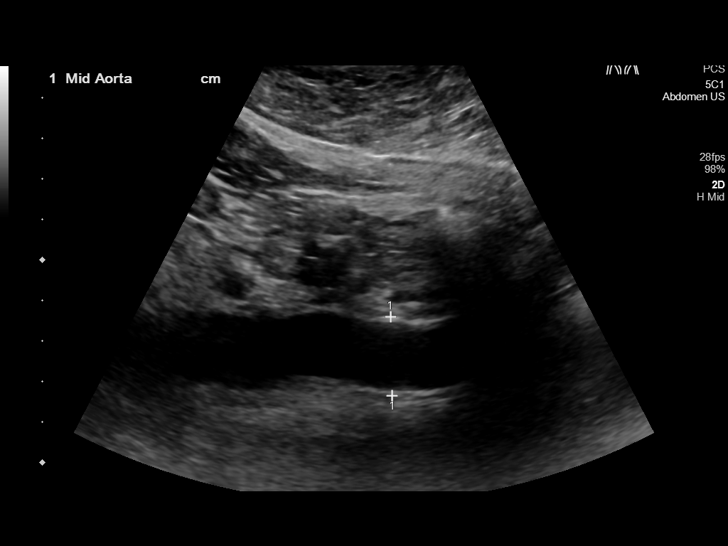
[im 112/112]
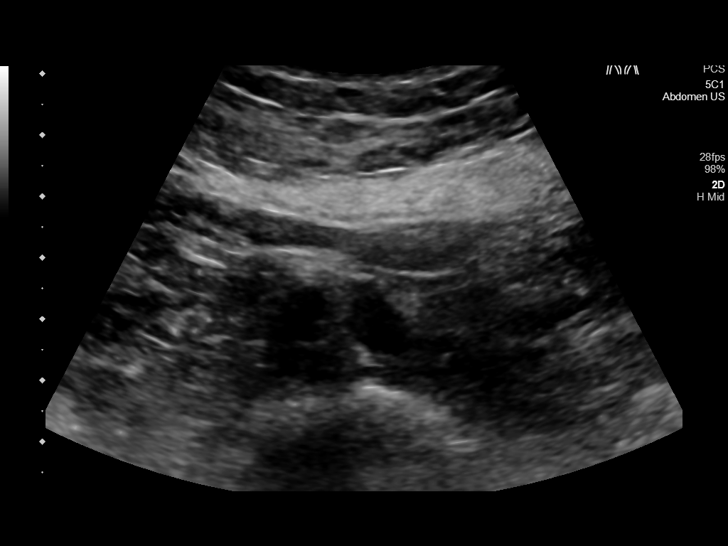

[14 of 25 positions shown; findings below may reference images not displayed]

FINDINGS: Gallbladder: Gallbladder is well distended with multiple stones
within. Negative sonographic Murphy's sign is noted.

Common bile duct: Diameter: 5 mm

Liver: No focal lesion identified. Within normal limits in
parenchymal echogenicity. Portal vein is patent on color Doppler
imaging with normal direction of blood flow towards the liver.

IVC: No abnormality visualized.

Pancreas: Limited due to overlying bowel gas.

Spleen: Size and appearance within normal limits.

Right Kidney: Length: 8.0 cm. Echogenicity within normal limits. No
mass or hydronephrosis visualized.

Left Kidney: Length: 9.9 cm. Peripelvic cysts are noted in the
lower pole on the left stable in appearance from the prior exam.

Abdominal aorta: No aneurysm visualized.

Other findings: None.
IMPRESSION: Cholelithiasis without complicating factors. No other focal
abnormality is noted.

## 2020-06-08 DIAGNOSIS — F439 Reaction to severe stress, unspecified: Secondary | ICD-10-CM | POA: Diagnosis not present

## 2020-06-08 DIAGNOSIS — R0602 Shortness of breath: Secondary | ICD-10-CM | POA: Diagnosis not present

## 2020-06-08 DIAGNOSIS — I341 Nonrheumatic mitral (valve) prolapse: Secondary | ICD-10-CM | POA: Diagnosis not present

## 2020-06-08 DIAGNOSIS — R079 Chest pain, unspecified: Secondary | ICD-10-CM | POA: Diagnosis not present

## 2020-06-08 DIAGNOSIS — E7800 Pure hypercholesterolemia, unspecified: Secondary | ICD-10-CM | POA: Diagnosis not present

## 2020-06-08 DIAGNOSIS — R5383 Other fatigue: Secondary | ICD-10-CM | POA: Diagnosis not present

## 2020-06-20 ENCOUNTER — Ambulatory Visit (INDEPENDENT_AMBULATORY_CARE_PROVIDER_SITE_OTHER): Payer: PPO | Admitting: Internal Medicine

## 2020-06-20 ENCOUNTER — Other Ambulatory Visit: Payer: Self-pay

## 2020-06-20 ENCOUNTER — Encounter: Payer: Self-pay | Admitting: Internal Medicine

## 2020-06-20 DIAGNOSIS — F439 Reaction to severe stress, unspecified: Secondary | ICD-10-CM | POA: Diagnosis not present

## 2020-06-20 DIAGNOSIS — H539 Unspecified visual disturbance: Secondary | ICD-10-CM

## 2020-06-20 DIAGNOSIS — I341 Nonrheumatic mitral (valve) prolapse: Secondary | ICD-10-CM | POA: Diagnosis not present

## 2020-06-20 DIAGNOSIS — R0602 Shortness of breath: Secondary | ICD-10-CM | POA: Diagnosis not present

## 2020-06-20 DIAGNOSIS — R079 Chest pain, unspecified: Secondary | ICD-10-CM | POA: Diagnosis not present

## 2020-06-20 DIAGNOSIS — H9190 Unspecified hearing loss, unspecified ear: Secondary | ICD-10-CM

## 2020-06-20 DIAGNOSIS — K8020 Calculus of gallbladder without cholecystitis without obstruction: Secondary | ICD-10-CM

## 2020-06-20 DIAGNOSIS — E7800 Pure hypercholesterolemia, unspecified: Secondary | ICD-10-CM

## 2020-06-20 DIAGNOSIS — R42 Dizziness and giddiness: Secondary | ICD-10-CM | POA: Diagnosis not present

## 2020-06-20 NOTE — Progress Notes (Signed)
Patient ID: Lynn Mann, female   DOB: Dec 20, 1950, 69 y.o.   MRN: [id number]   Subjective:    Patient ID: Lynn Mann, female    DOB: 1951-04-25, 69 y.o.   MRN: [id number]  HPI This visit occurred during the SARS-CoV-2 public health emergency.  Safety protocols were in place, including screening questions prior to the visit, additional usage of staff PPE, and extensive cleaning of exam room while observing appropriate contact time as indicated for disinfecting solutions.  Patient here for a scheduled follow up.  Still with increased stress.  Husband recently passed unexpectedly - 03/26/20.  Has good support.  She lost her list of names of counselors.  Information given today.  With decreased hearing.  Also notices a little light headedness.  No significant headache.  No significant sinus pressure or congestion.  No chest congestion, chest tightness or sob.  Saw cardiology for f/u recently.  ECHO - no significant abnormality other than possible trivial mitral valve prolapse.  Sleep study - no significant sleep apnea.  No evidence of arrhythmia or structural abnormalities.  CXR and stress echo - unremarkable.  Eating.  No nausea, vomiting or abdominal pain reported.  Has noticed over the last few months - some vision change.  Describes corners being darker.  No actual vision loss.  Discussed f/u with her eye MD.  Discussed further w/up - including head scan, etc.    Past Medical History:  Diagnosis Date  . Arthritis   . C. difficile diarrhea    age 74s-40s   . Chicken pox   . Cholecystitis 11/2011   Did not require sgy - Dr. Staci Acosta - [facility]  (cholelithiasis)  . H/O Clostridium difficile infection   . IBS (irritable bowel syndrome)   . MRSA exposure 2005   Spider bite  . MVP (mitral valve prolapse)    Stable - Dr. Ubaldo Glassing  . Rheumatic fever    Past Surgical History:  Procedure Laterality Date  . CATARACT EXTRACTION  [id number]  . MUSCLE BIOPSY     Family History  Problem Relation Age of  Onset  . Arthritis Mother   . Stroke Mother   . Diabetes Mother   . Hypertension Mother   . Arthritis Father   . Stroke Father   . Diabetes Paternal Grandmother   . Cancer Paternal Uncle        colon  . Heart disease Other        maternal and paternal side  . Breast cancer Paternal Aunt   . Breast cancer Cousin   . Breast cancer Cousin        female cousin  . Breast cancer Cousin    Social History   Socioeconomic History  . Marital status: Married    Spouse name: Not on file  . Number of children: Not on file  . Years of education: 68  . Highest education level: Not on file  Occupational History  . Occupation: Retired  Tobacco Use  . Smoking status: Never Smoker  . Smokeless tobacco: Never Used  Vaping Use  . Vaping Use: Never used  Substance and Sexual Activity  . Alcohol use: Yes    Alcohol/week: 0.0 standard drinks    Comment: Rarely  . Drug use: No  . Sexual activity: Yes    Partners: Male    Birth control/protection: Post-menopausal  Other Topics Concern  . Not on file  Social History Narrative  . Not on file   Social Determinants of Health  Financial Resource Strain:   . Difficulty of Paying Living Expenses: Not on file  Food Insecurity:   . Worried About Charity fundraiser in the Last Year: Not on file  . Ran Out of Food in the Last Year: Not on file  Transportation Needs:   . Lack of Transportation (Medical): Not on file  . Lack of Transportation (Non-Medical): Not on file  Physical Activity:   . Days of Exercise per Week: Not on file  . Minutes of Exercise per Session: Not on file  Stress:   . Feeling of Stress : Not on file  Social Connections:   . Frequency of Communication with Friends and Family: Not on file  . Frequency of Social Gatherings with Friends and Family: Not on file  . Attends Religious Services: Not on file  . Active Member of Clubs or Organizations: Not on file  . Attends Archivist Meetings: Not on file  .  Marital Status: Not on file    Outpatient Encounter Medications as of 06/20/2020  Medication Sig  . albuterol (PROVENTIL HFA;VENTOLIN HFA) 108 (90 Base) MCG/ACT inhaler Inhale 2 puffs into the lungs every 6 (six) hours as needed for wheezing or shortness of breath.  Marland Kitchen aspirin 325 MG tablet Take by mouth.  . [DISCONTINUED] CALCIUM PO Take by mouth. (Patient not taking: Reported on 05/16/2020)  . [DISCONTINUED] cholecalciferol (VITAMIN D3) 25 MCG (1000 UT) tablet Take 1,000 Units by mouth once a week. (Patient not taking: Reported on 05/16/2020)  . [DISCONTINUED] sertraline (ZOLOFT) 25 MG tablet Take 1 tablet (25 mg total) by mouth daily.   No facility-administered encounter medications on file as of 06/20/2020.    Review of Systems  Constitutional: Negative for appetite change and unexpected weight change.  HENT: Positive for hearing loss. Negative for congestion and sinus pressure.   Respiratory: Negative for cough, chest tightness and shortness of breath.   Cardiovascular: Negative for chest pain, palpitations and leg swelling.  Gastrointestinal: Negative for abdominal pain, diarrhea, nausea and vomiting.  Genitourinary: Negative for difficulty urinating and dysuria.  Musculoskeletal: Negative for joint swelling and myalgias.  Skin: Negative for color change and rash.  Neurological: Positive for light-headedness. Negative for dizziness.       No significant headache.   Psychiatric/Behavioral: Negative for agitation and dysphoric mood.       Increased stress as outlined.        Objective:    Physical Exam Vitals reviewed.  Constitutional:      General: She is not in acute distress.    Appearance: Normal appearance.  HENT:     Head: Normocephalic and atraumatic.     Right Ear: External ear normal. There is no impacted cerumen.     Left Ear: External ear normal. There is no impacted cerumen.     Nose: Nose normal.  Eyes:     General: No scleral icterus.       Right eye: No  discharge.        Left eye: No discharge.     Conjunctiva/sclera: Conjunctivae normal.  Neck:     Thyroid: No thyromegaly.  Cardiovascular:     Rate and Rhythm: Normal rate and regular rhythm.  Pulmonary:     Effort: No respiratory distress.     Breath sounds: Normal breath sounds. No wheezing.  Abdominal:     General: Bowel sounds are normal.     Palpations: Abdomen is soft.     Tenderness: There is no abdominal tenderness.  Musculoskeletal:        General: No swelling or tenderness.     Cervical back: Neck supple. No tenderness.  Lymphadenopathy:     Cervical: No cervical adenopathy.  Skin:    Findings: No erythema or rash.  Neurological:     Mental Status: She is alert.  Psychiatric:        Mood and Affect: Mood normal.        Behavior: Behavior normal.     BP 126/72   Pulse 86   Temp 98.4 F (36.9 C) (Oral)   Resp 16   Ht 5\' 7"  (1.702 m)   Wt 186 lb 6.4 oz (84.6 kg)   SpO2 98%   BMI 29.19 kg/m  Wt Readings from Last 3 Encounters:  06/20/20 186 lb 6.4 oz (84.6 kg)  05/16/20 181 lb 3.2 oz (82.2 kg)  03/06/20 187 lb 6.3 oz (85 kg)     Lab Results  Component Value Date   WBC 6.3 05/16/2020   HGB 14.1 05/16/2020   HCT 40.7 05/16/2020   PLT 294.0 05/16/2020   GLUCOSE 80 05/16/2020   CHOL 195 05/16/2020   TRIG 107.0 05/16/2020   HDL 46.30 05/16/2020   LDLDIRECT 110.0 02/11/2017   LDLCALC 127 (H) 05/16/2020   ALT 15 05/24/2020   AST 19 05/24/2020   NA 140 05/16/2020   K 4.1 05/16/2020   CL 103 05/16/2020   CREATININE 0.71 05/16/2020   BUN 11 05/16/2020   CO2 28 05/16/2020   TSH 2.48 05/16/2020   HGBA1C 5.4 07/15/2017    US Abdomen Complete  Result Date: 06/07/2020 CLINICAL DATA:  Elevated bilirubin EXAM: ABDOMEN ULTRASOUND COMPLETE COMPARISON:  None. FINDINGS: Gallbladder: Gallbladder is well distended with multiple stones within. Negative sonographic Murphy's sign is noted. Common bile duct: Diameter: 5 mm Liver: No focal lesion identified. Within  normal limits in parenchymal echogenicity. Portal vein is patent on color Doppler imaging with normal direction of blood flow towards the liver. IVC: No abnormality visualized. Pancreas: Limited due to overlying bowel gas. Spleen: Size and appearance within normal limits. Right Kidney: Length: 8.0 cm. Echogenicity within normal limits. No mass or hydronephrosis visualized. Left Kidney: Length: 9.9 cm. Peripelvic cysts are noted in the lower pole on the left stable in appearance from the prior exam. Abdominal aorta: No aneurysm visualized. Other findings: None. IMPRESSION: Cholelithiasis without complicating factors. No other focal abnormality is noted. Electronically Signed   By: Inez Catalina M.D.   On: 06/07/2020 16:08       Assessment & Plan:   Problem List Items Addressed This Visit    Stress    Increased stress.  Discussed treatment.  Discussed zoloft last visit.  She never started the medication.  Wants to hold at this time.  Information given regarding counselors.  Follow.       SOB (shortness of breath)    CXR unremarkable.  No significant sob reported today.  Follow.        MVP (mitral valve prolapse)    Trivial MVP.  No significant regurgitation.  Follow.       Light headedness    Light headedness as outlined.  Head full and some hearing change.  Discussed further evaluation.  Agreed to ENT referral.        Relevant Orders   Ambulatory referral to ENT   Hypercholesterolemia    Low cholesterol diet and exercise.  Follow lipid panel.        Hearing loss  Hearing loss as outlined.  Refer to ENT.       Relevant Orders   Ambulatory referral to ENT   Cholelithiasis    Has know gallstones.  No abdominal pain, nausea or vomiting.  Follow.       Chest pain    Previously reported chest tightness.  Just saw cardiology.  Stress echo - unremarkable.  cxr unremarkable.  No chest pain reported today.  Follow.       Change in vision    Some darkness - corners.  No actual vision  loss.  Present for at least a few months.  Unclear etiology.  Discussed further w/up including head scan, etc.  Agreeable to have eyes evaluated. Plans to call Dr Ellin Mayhew.           I spent 45 minutes with the patient and more than 50% of the time was spent in consultation regarding the above. Time spent discussing her increased stress and current symptoms and concerns.  Time also spent discussing plans for further evaluation and treatment.     Einar Pheasant, MD

## 2020-06-24 ENCOUNTER — Encounter: Payer: Self-pay | Admitting: Internal Medicine

## 2020-06-24 DIAGNOSIS — R42 Dizziness and giddiness: Secondary | ICD-10-CM | POA: Insufficient documentation

## 2020-06-24 DIAGNOSIS — H532 Diplopia: Secondary | ICD-10-CM | POA: Insufficient documentation

## 2020-06-24 DIAGNOSIS — H9190 Unspecified hearing loss, unspecified ear: Secondary | ICD-10-CM | POA: Insufficient documentation

## 2020-06-24 DIAGNOSIS — H539 Unspecified visual disturbance: Secondary | ICD-10-CM | POA: Insufficient documentation

## 2020-06-24 NOTE — Assessment & Plan Note (Signed)
Trivial MVP.  No significant regurgitation.  Follow.

## 2020-06-24 NOTE — Assessment & Plan Note (Signed)
Hearing loss as outlined.  Refer to ENT.

## 2020-06-24 NOTE — Assessment & Plan Note (Signed)
Has know gallstones.  No abdominal pain, nausea or vomiting.  Follow.

## 2020-06-24 NOTE — Assessment & Plan Note (Signed)
Previously reported chest tightness.  Just saw cardiology.  Stress echo - unremarkable.  cxr unremarkable.  No chest pain reported today.  Follow.

## 2020-06-24 NOTE — Assessment & Plan Note (Signed)
Some darkness - corners.  No actual vision loss.  Present for at least a few months.  Unclear etiology.  Discussed further w/up including head scan, etc.  Agreeable to have eyes evaluated. Plans to call Dr Ellin Mayhew.

## 2020-06-24 NOTE — Assessment & Plan Note (Signed)
Light headedness as outlined.  Head full and some hearing change.  Discussed further evaluation.  Agreed to ENT referral.

## 2020-06-24 NOTE — Assessment & Plan Note (Signed)
Increased stress.  Discussed treatment.  Discussed zoloft last visit.  She never started the medication.  Wants to hold at this time.  Information given regarding counselors.  Follow.

## 2020-06-24 NOTE — Assessment & Plan Note (Signed)
Low cholesterol diet and exercise.  Follow lipid panel.   

## 2020-06-24 NOTE — Assessment & Plan Note (Signed)
CXR unremarkable.  No significant sob reported today.  Follow.

## 2020-06-26 ENCOUNTER — Ambulatory Visit (INDEPENDENT_AMBULATORY_CARE_PROVIDER_SITE_OTHER): Payer: PPO

## 2020-06-26 ENCOUNTER — Telehealth: Payer: Self-pay

## 2020-06-26 VITALS — Ht 67.0 in | Wt 186.0 lb

## 2020-06-26 DIAGNOSIS — Z0000 Encounter for general adult medical examination without abnormal findings: Secondary | ICD-10-CM

## 2020-06-26 DIAGNOSIS — J111 Influenza due to unidentified influenza virus with other respiratory manifestations: Secondary | ICD-10-CM

## 2020-06-26 MED ORDER — ALBUTEROL SULFATE HFA 108 (90 BASE) MCG/ACT IN AERS: 2.0000 | INHALATION_SPRAY | Freq: Four times a day (QID) | RESPIRATORY_TRACT | 1 refills | Status: DC | PRN

## 2020-06-26 NOTE — Telephone Encounter (Signed)
Okay yes. She will continue with the refill on file. She was unsure after having several different kinds. Agrees to notify office if it is not the one she is looking for. Please refill albuterol on file.

## 2020-06-26 NOTE — Telephone Encounter (Signed)
I have sent in refill for albuterol.  She previously saw Dr Alva Garnet.  He had her on Breo.  Has not had this inhaler in a while.  I have sent in rx for albuterol.  Let me know if needs something more or persistent problems.

## 2020-06-26 NOTE — Telephone Encounter (Signed)
Please confirm what is needed.  Albuterol inhaler does not have powder.  Is this what she is wanting.

## 2020-06-26 NOTE — Progress Notes (Addendum)
Subjective:   Lynn Mann is a 69 y.o. female who presents for an Initial Medicare Annual Wellness Visit.  Review of Systems    No ROS.  Medicare Wellness Virtual Visit.   Cardiac Risk Factors include: advanced age (>54men, >98 women)     Objective:    Today's Vitals   06/26/20 1405  Weight: 186 lb (84.4 kg)  Height: 5\' 7"  (1.702 m)   Body mass index is 29.13 kg/m.  Advanced Directives 06/26/2020 03/06/2020 04/03/2018 11/18/2017 10/24/2017 06/30/2017 12/08/2016  Does Patient Have a Medical Advance Directive? No No No No No No No  Would patient like information on creating a medical advance directive? No - Patient declined No - Patient declined No - Patient declined - - - -    Current Medications (verified) Outpatient Encounter Medications as of 06/26/2020  Medication Sig  . albuterol (PROVENTIL HFA;VENTOLIN HFA) 108 (90 Base) MCG/ACT inhaler Inhale 2 puffs into the lungs every 6 (six) hours as needed for wheezing or shortness of breath.  Marland Kitchen aspirin 325 MG tablet Take by mouth.   No facility-administered encounter medications on file as of 06/26/2020.    Allergies (verified) Other, Adhesive [tape], Tartrazine, and Yellow dyes (non-tartrazine)   History: Past Medical History:  Diagnosis Date  . Arthritis   . C. difficile diarrhea    age 47s-40s   . Chicken pox   . Cholecystitis 11/2011   Did not require sgy - Dr. Staci Acosta - [facility]  (cholelithiasis)  . H/O Clostridium difficile infection   . IBS (irritable bowel syndrome)   . MRSA exposure 2005   Spider bite  . MVP (mitral valve prolapse)    Stable - Dr. Ubaldo Glassing  . Rheumatic fever    Past Surgical History:  Procedure Laterality Date  . CATARACT EXTRACTION  [id number]  . MUSCLE BIOPSY     Family History  Problem Relation Age of Onset  . Arthritis Mother   . Stroke Mother   . Diabetes Mother   . Hypertension Mother   . Arthritis Father   . Stroke Father   . Diabetes Paternal Grandmother   . Cancer Paternal Uncle         colon  . Heart disease Other        maternal and paternal side  . Breast cancer Paternal Aunt   . Breast cancer Cousin   . Breast cancer Cousin        female cousin  . Breast cancer Cousin    Social History   Socioeconomic History  . Marital status: Married    Spouse name: Not on file  . Number of children: Not on file  . Years of education: 47  . Highest education level: Not on file  Occupational History  . Occupation: Retired  Tobacco Use  . Smoking status: Never Smoker  . Smokeless tobacco: Never Used  Vaping Use  . Vaping Use: Never used  Substance and Sexual Activity  . Alcohol use: Yes    Alcohol/week: 0.0 standard drinks    Comment: Rarely  . Drug use: No  . Sexual activity: Yes    Partners: Male    Birth control/protection: Post-menopausal  Other Topics Concern  . Not on file  Social History Narrative  . Not on file   Social Determinants of Health   Financial Resource Strain: Low Risk   . Difficulty of Paying Living Expenses: Not hard at all  Food Insecurity: No Food Insecurity  . Worried About [employer] of  Food in the Last Year: Never true  . Ran Out of Food in the Last Year: Never true  Transportation Needs: No Transportation Needs  . Lack of Transportation (Medical): No  . Lack of Transportation (Non-Medical): No  Physical Activity:   . Days of Exercise per Week: Not on file  . Minutes of Exercise per Session: Not on file  Stress: No Stress Concern Present  . Feeling of Stress : Not at all  Social Connections: Unknown  . Frequency of Communication with Friends and Family: More than three times a week  . Frequency of Social Gatherings with Friends and Family: More than three times a week  . Attends Religious Services: Not on file  . Active Member of Clubs or Organizations: Not on file  . Attends Archivist Meetings: Not on file  . Marital Status: Widowed    Tobacco Counseling Counseling given: Not Answered   Clinical  Intake:  Pre-visit preparation completed: Yes        Diabetes: No  How often do you need to have someone help you when you read instructions, pamphlets, or other written materials from your doctor or pharmacy?: 1 - Never Interpreter Needed?: No      Activities of Daily Living In your present state of health, do you have any difficulty performing the following activities: 06/26/2020  Hearing? N  Vision? N  Difficulty concentrating or making decisions? N  Walking or climbing stairs? N  Dressing or bathing? N  Doing errands, shopping? N  Preparing Food and eating ? N  Using the Toilet? N  In the past six months, have you accidently leaked urine? Y  Do you have problems with loss of bowel control? N  Managing your Medications? N  Managing your Finances? N  Housekeeping or managing your Housekeeping? N  Some recent data might be hidden    Patient Care Team: Einar Pheasant, MD as PCP - General (Internal Medicine)  Indicate any recent Medical Services you may have received from other than [facility] providers in the past year (date may be approximate).     Assessment:   This is a routine wellness examination for [place].  I connected with [facility] today by telephone and verified that I am speaking with the correct person using two identifiers. Location patient: home Location provider: work Persons participating in the virtual visit: patient, Marine scientist.    I discussed the limitations, risks, security and privacy concerns of performing an evaluation and management service by telephone and the availability of in person appointments. The patient expressed understanding and verbally consented to this telephonic visit.    Interactive audio and video telecommunications were attempted between this provider and patient, however failed, due to patient having technical difficulties OR patient did not have access to video capability.  We continued and completed visit with audio only.  Some vital  signs may be absent or patient reported.   Hearing/Vision screen  Hearing Screening   125Hz  250Hz  500Hz  1000Hz  2000Hz  3000Hz  4000Hz  6000Hz  8000Hz   Right ear:           Left ear:           Comments: Patient is able to hear conversational tones without difficulty.  No issues reported.   Vision Screening Comments: Wears corrective lenses Cataract extraction, bilateral Visual acuity not assessed, virtual visit.  They have seen their ophthalmologist in the last 12 months.     Dietary issues and exercise activities discussed: Current Exercise Habits: Home exercise routine  Regular diet Good water intake  Goals    . Start painting a little      Depression Screen PHQ 2/9 Scores 06/26/2020 06/20/2020 06/17/2019 04/03/2018 12/12/2016 10/07/2016 07/25/2015  PHQ - 2 Score 0 0 0 0 0 0 0    Fall Risk Fall Risk  06/26/2020 12/02/2019 06/18/2019 06/17/2018 04/03/2018  Falls in the past year? 0 0 0 0 No  Comment - - Emmi Telephone Survey: data to providers prior to load - -  Number falls in past yr: 0 0 - - -  Injury with Fall? 0 0 - - -  Follow up Falls evaluation completed Falls evaluation completed - - -   Handrails in use when climbing stairs? Yes Home free of loose throw rugs in walkways, pet beds, electrical cords, etc? Yes  Adequate lighting in your home to reduce risk of falls? Yes   ASSISTIVE DEVICES UTILIZED TO PREVENT FALLS: Life alert? No  Use of a cane, walker or w/c? No   Grab bars in the bathroom? No  Shower chair or bench in shower? No  Elevated toilet seat or a handicapped toilet? No   TIMED UP AND GO: Was the test performed? No . Virtual visit.    Cognitive Function:  Patient is alert and oriented x3.  Denies difficulty with memory loss.  MMSE/6CIT deferred. Normal by direct communication/observation.   Immunizations Immunization History  Administered Date(s) Administered  . Moderna SARS-COVID-2 Vaccination 10/28/2019, 11/18/2019  . Tdap 07/25/2015   Health  Maintenance Health Maintenance  Topic Date Due  . MAMMOGRAM  06/05/2022  . COLONOSCOPY  05/06/2023  . TETANUS/TDAP  07/24/2025  . DEXA SCAN  Completed  . COVID-19 Vaccine  Completed  . Hepatitis C Screening  Completed  . INFLUENZA VACCINE  Discontinued  . PNA vac Low Risk Adult  Discontinued   Colonoscopy- 09/22/17. Repeat every 5 years.   Mammogram status: Completed 06/05/20. Repeat every year   Bone Density- 07/02/18. Repeat every 5 years.   Lung Cancer Screening: (Low Dose CT Chest recommended if Age 97-80 years, 30 pack-year currently smoking OR have quit w/in 15years.) does not qualify.   Hepatitis C Screening: Completed 07/24/00.  Vision Screening: Recommended annual ophthalmology exams for early detection of glaucoma and other disorders of the eye. Is the patient up to date with their annual eye exam?  Yes  Who is the provider or what is the name of the office in which the patient attends annual eye exams? Dr. Ellin Mayhew.   Dental Screening: Recommended annual dental exams for proper oral hygiene.  Community Resource Referral / Chronic Care Management: CRR required this visit?  Yes   CCM required this visit?  Yes      Plan:   Keep all routine maintenance appointments.   Follow up 08/10/20 @ 2:30  I have personally reviewed and noted the following in the patient's chart:   . Medical and social history . Use of alcohol, tobacco or illicit drugs  . Current medications and supplements . Functional ability and status . Nutritional status . Physical activity . Advanced directives . List of other physicians . Hospitalizations, surgeries, and ER visits in previous 12 months . Vitals . Screenings to include cognitive, depression, and falls . Referrals and appointments  In addition, I have reviewed and discussed with patient certain preventive protocols, quality metrics, and best practice recommendations. A written personalized care plan for preventive services as well as  general preventive health recommendations were provided to patient via mychart.  Varney Biles, LPN   [id number]    Reviewed above information.  Agree with assessment and plan.    Dr Nicki Reaper

## 2020-06-26 NOTE — Patient Instructions (Addendum)
Lynn Mann , Thank you for taking time to come for your Medicare Wellness Visit. I appreciate your ongoing commitment to your health goals. Please review the following plan we discussed and let me know if I can assist you in the future.   These are the goals we discussed: Goals    . Start painting a little       This is a list of the screening recommended for you and due dates:  Health Maintenance  Topic Date Due  . Mammogram  06/05/2022  . Colon Cancer Screening  05/06/2023  . Tetanus Vaccine  07/24/2025  . DEXA scan (bone density measurement)  Completed  . COVID-19 Vaccine  Completed  .  Hepatitis C: One time screening is recommended by Center for Disease Control  (CDC) for  adults born from [birth year] through 1963.   Completed  . Flu Shot  Discontinued  . Pneumonia vaccines  Discontinued    Immunizations Immunization History  Administered Date(s) Administered  . Moderna SARS-COVID-2 Vaccination 10/28/2019, 11/18/2019  . Tdap 07/25/2015   Advanced directives: not yet completed.   Conditions/risks identified: none new.   Follow up in one year for your annual wellness visit.   Preventive Care 49 Years and Older, Female Preventive care refers to lifestyle choices and visits with your health care provider that can promote health and wellness. What does preventive care include?  A yearly physical exam. This is also called an annual well check.  Dental exams once or twice a year.  Routine eye exams. Ask your health care provider how often you should have your eyes checked.  Personal lifestyle choices, including:  Daily care of your teeth and gums.  Regular physical activity.  Eating a healthy diet.  Avoiding tobacco and drug use.  Limiting alcohol use.  Practicing safe sex.  Taking low-dose aspirin every day.  Taking vitamin and mineral supplements as recommended by your health care provider. What happens during an annual well check? The services and screenings  done by your health care provider during your annual well check will depend on your age, overall health, lifestyle risk factors, and family history of disease. Counseling  Your health care provider may ask you questions about your:  Alcohol use.  Tobacco use.  Drug use.  Emotional well-being.  Home and relationship well-being.  Sexual activity.  Eating habits.  History of falls.  Memory and ability to understand (cognition).  Work and work Statistician.  Reproductive health. Screening  You may have the following tests or measurements:  Height, weight, and BMI.  Blood pressure.  Lipid and cholesterol levels. These may be checked every 5 years, or more frequently if you are over 19 years old.  Skin check.  Lung cancer screening. You may have this screening every year starting at age 81 if you have a 30-pack-year history of smoking and currently smoke or have quit within the past 15 years.  Fecal occult blood test (FOBT) of the stool. You may have this test every year starting at age 2.  Flexible sigmoidoscopy or colonoscopy. You may have a sigmoidoscopy every 5 years or a colonoscopy every 10 years starting at age 13.  Hepatitis C blood test.  Hepatitis B blood test.  Sexually transmitted disease (STD) testing.  Diabetes screening. This is done by checking your blood sugar (glucose) after you have not eaten for a while (fasting). You may have this done every 1-3 years.  Bone density scan. This is done to screen for osteoporosis. You  may have this done starting at age 55.  Mammogram. This may be done every 1-2 years. Talk to your health care provider about how often you should have regular mammograms. Talk with your health care provider about your test results, treatment options, and if necessary, the need for more tests. Vaccines  Your health care provider may recommend certain vaccines, such as:  Influenza vaccine. This is recommended every year.  Tetanus,  diphtheria, and acellular pertussis (Tdap, Td) vaccine. You may need a Td booster every 10 years.  Zoster vaccine. You may need this after age 55.  Pneumococcal 13-valent conjugate (PCV13) vaccine. One dose is recommended after age 51.  Pneumococcal polysaccharide (PPSV23) vaccine. One dose is recommended after age 42. Talk to your health care provider about which screenings and vaccines you need and how often you need them. This information is not intended to replace advice given to you by your health care provider. Make sure you discuss any questions you have with your health care provider. Document Released: 08/11/2015 Document Revised: 04/03/2016 Document Reviewed: 05/16/2015 Elsevier Interactive Patient Education  2017 [place] Prevention in the Home Falls can cause injuries. They can happen to people of all ages. There are many things you can do to make your home safe and to help prevent falls. What can I do on the outside of my home?  Regularly fix the edges of walkways and driveways and fix any cracks.  Remove anything that might make you trip as you walk through a door, such as a raised step or threshold.  Trim any bushes or trees on the path to your home.  Use bright outdoor lighting.  Clear any walking paths of anything that might make someone trip, such as rocks or tools.  Regularly check to see if handrails are loose or broken. Make sure that both sides of any steps have handrails.  Any raised decks and porches should have guardrails on the edges.  Have any leaves, snow, or ice cleared regularly.  Use sand or salt on walking paths during winter.  Clean up any spills in your garage right away. This includes oil or grease spills. What can I do in the bathroom?  Use night lights.  Install grab bars by the toilet and in the tub and shower. Do not use towel bars as grab bars.  Use non-skid mats or decals in the tub or shower.  If you need to sit down in  the shower, use a plastic, non-slip stool.  Keep the floor dry. Clean up any water that spills on the floor as soon as it happens.  Remove soap buildup in the tub or shower regularly.  Attach bath mats securely with double-sided non-slip rug tape.  Do not have throw rugs and other things on the floor that can make you trip. What can I do in the bedroom?  Use night lights.  Make sure that you have a light by your bed that is easy to reach.  Do not use any sheets or blankets that are too big for your bed. They should not hang down onto the floor.  Have a firm chair that has side arms. You can use this for support while you get dressed.  Do not have throw rugs and other things on the floor that can make you trip. What can I do in the kitchen?  Clean up any spills right away.  Avoid walking on wet floors.  Keep items that you use a lot  in easy-to-reach places.  If you need to reach something above you, use a strong step stool that has a grab bar.  Keep electrical cords out of the way.  Do not use floor polish or wax that makes floors slippery. If you must use wax, use non-skid floor wax.  Do not have throw rugs and other things on the floor that can make you trip. What can I do with my stairs?  Do not leave any items on the stairs.  Make sure that there are handrails on both sides of the stairs and use them. Fix handrails that are broken or loose. Make sure that handrails are as long as the stairways.  Check any carpeting to make sure that it is firmly attached to the stairs. Fix any carpet that is loose or worn.  Avoid having throw rugs at the top or bottom of the stairs. If you do have throw rugs, attach them to the floor with carpet tape.  Make sure that you have a light switch at the top of the stairs and the bottom of the stairs. If you do not have them, ask someone to add them for you. What else can I do to help prevent falls?  Wear shoes that:  Do not have high  heels.  Have rubber bottoms.  Are comfortable and fit you well.  Are closed at the toe. Do not wear sandals.  If you use a stepladder:  Make sure that it is fully opened. Do not climb a closed stepladder.  Make sure that both sides of the stepladder are locked into place.  Ask someone to hold it for you, if possible.  Clearly mark and make sure that you can see:  Any grab bars or handrails.  First and last steps.  Where the edge of each step is.  Use tools that help you move around (mobility aids) if they are needed. These include:  Canes.  Walkers.  Scooters.  Crutches.  Turn on the lights when you go into a dark area. Replace any light bulbs as soon as they burn out.  Set up your furniture so you have a clear path. Avoid moving your furniture around.  If any of your floors are uneven, fix them.  If there are any pets around you, be aware of where they are.  Review your medicines with your doctor. Some medicines can make you feel dizzy. This can increase your chance of falling. Ask your doctor what other things that you can do to help prevent falls. This information is not intended to replace advice given to you by your health care provider. Make sure you discuss any questions you have with your health care provider. Document Released: 05/11/2009 Document Revised: 12/21/2015 Document Reviewed: 08/19/2014 Elsevier Interactive Patient Education  2017 [employer].

## 2020-06-26 NOTE — Telephone Encounter (Signed)
Patient requests refill on inhaler. Specified she would like a powder free inhaler, however if unable to order okay to re-order albuterol currently on file.

## 2020-06-27 NOTE — Telephone Encounter (Signed)
Left detailed message for patient.

## 2020-08-03 DIAGNOSIS — H01009 Unspecified blepharitis unspecified eye, unspecified eyelid: Secondary | ICD-10-CM | POA: Diagnosis not present

## 2020-08-03 DIAGNOSIS — H04129 Dry eye syndrome of unspecified lacrimal gland: Secondary | ICD-10-CM | POA: Diagnosis not present

## 2020-08-03 DIAGNOSIS — H353131 Nonexudative age-related macular degeneration, bilateral, early dry stage: Secondary | ICD-10-CM | POA: Diagnosis not present

## 2020-08-07 ENCOUNTER — Telehealth: Payer: Self-pay

## 2020-08-07 NOTE — Telephone Encounter (Signed)
Pt called and wanted to know if she needed to reschedule her appt with Dr Nicki Reaper until after she sees ENT. Please call back to advise. She states that she still has not rescheduled with ENT.

## 2020-08-08 NOTE — Telephone Encounter (Signed)
Left detailed message for patient.

## 2020-08-08 NOTE — Telephone Encounter (Signed)
Pt called today to follow up on if she needed to keep appt or not  She wanted Dr. Nicki Reaper to make that call she said that she is not having any issues at this time

## 2020-08-08 NOTE — Telephone Encounter (Signed)
Pt returned your call.  

## 2020-08-10 ENCOUNTER — Ambulatory Visit: Payer: PPO | Admitting: Internal Medicine

## 2020-08-29 ENCOUNTER — Other Ambulatory Visit: Payer: Self-pay | Admitting: Otolaryngology

## 2020-08-29 DIAGNOSIS — H903 Sensorineural hearing loss, bilateral: Secondary | ICD-10-CM | POA: Diagnosis not present

## 2020-08-29 DIAGNOSIS — H93299 Other abnormal auditory perceptions, unspecified ear: Secondary | ICD-10-CM | POA: Diagnosis not present

## 2020-08-29 DIAGNOSIS — R42 Dizziness and giddiness: Secondary | ICD-10-CM

## 2020-09-14 ENCOUNTER — Ambulatory Visit
Admission: RE | Admit: 2020-09-14 | Discharge: 2020-09-14 | Disposition: A | Discharge status: Home / Self Care | Payer: PPO | Source: Ambulatory Visit | Attending: Otolaryngology | Admitting: Otolaryngology

## 2020-09-14 ENCOUNTER — Other Ambulatory Visit: Payer: Self-pay

## 2020-09-14 DIAGNOSIS — R42 Dizziness and giddiness: Secondary | ICD-10-CM | POA: Diagnosis not present

## 2020-09-14 DIAGNOSIS — I6782 Cerebral ischemia: Secondary | ICD-10-CM | POA: Diagnosis not present

## 2020-09-14 DIAGNOSIS — Q283 Other malformations of cerebral vessels: Secondary | ICD-10-CM | POA: Diagnosis not present

## 2020-09-14 IMAGING — MR MR BRAIN/IAC WO/W CM
11 of 15 series · 28 of 48 positions shown · IV contrast (gadavist)
Comparison: Head CT [DATE]

CLINICAL DATA: Dizziness.

EXAM:
MRI HEAD WITHOUT AND WITH CONTRAST
TECHNIQUE: Multiplanar, multiecho pulse sequences of the brain and surrounding
structures were obtained without and with intravenous contrast.
CONTRAST:  7.5mL GADAVIST GADOBUTROL 1 MMOL/ML IV SOLN

[Series 5: T1 · sagittal · 5.0mm · 0.62mm/px · 1 of 25 slices shown (1 of 3)]
[im 1/25]
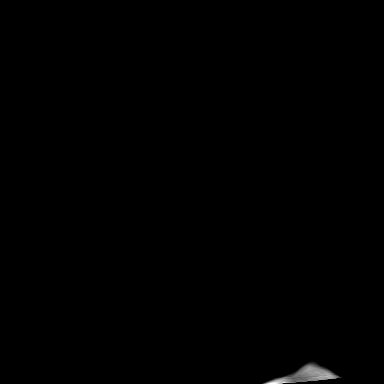

[Series 6: ax dwi_tracew · axial · 3.0mm · 0.60mm/px · z∈[-78,+74]mm · 2 of 48 slices shown]
[im 1/48]
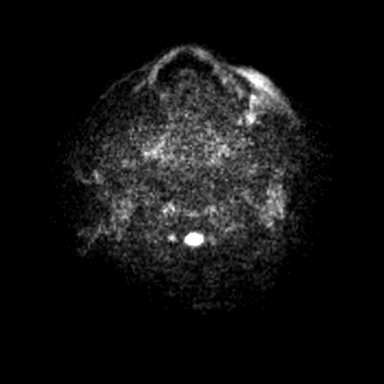
[im 48/48]
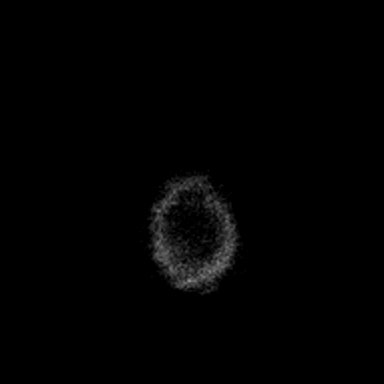

[Series 7: ax dwi_adc · axial · 3.0mm · 0.60mm/px · z∈[-78,+74]mm · 3 of 48 slices shown]
[im 1/48]
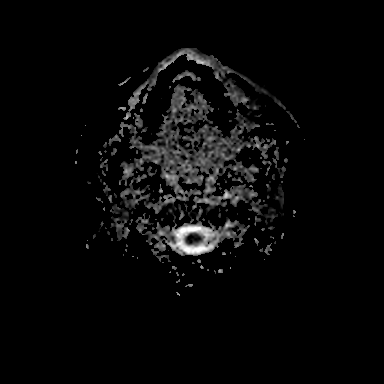
[im 24/48]
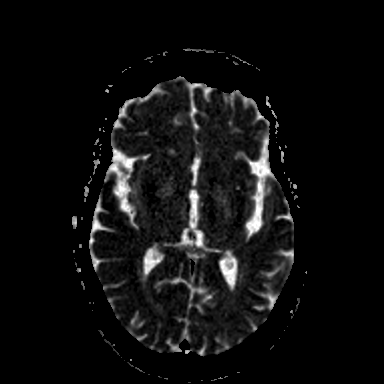
[im 48/48]
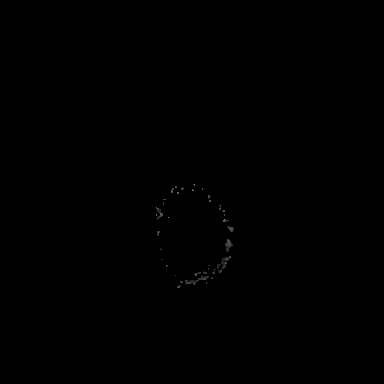

[Series 8: T2 · axial · 5.0mm · 0.53mm/px · z∈[-73,+68]mm · 2 of 25 slices shown]
[im 1/25]
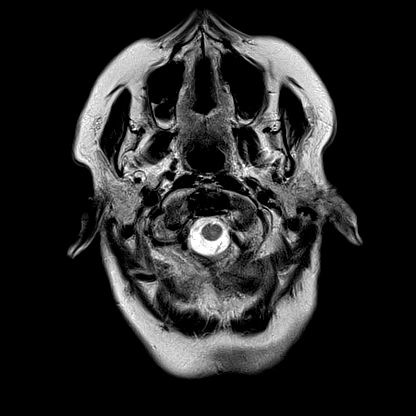
[im 25/25]
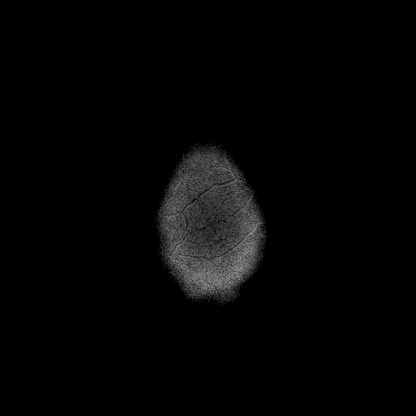

[Series 9: mag_images · axial · 3.0mm · 0.90mm/px · z∈[-89,+26]mm · 3 of 60 slices shown]
[im 1/60]
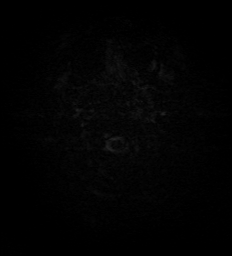
[im 20/60]
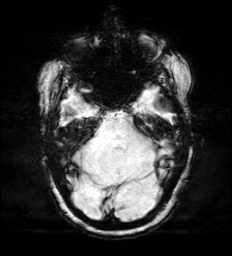
[im 40/60]
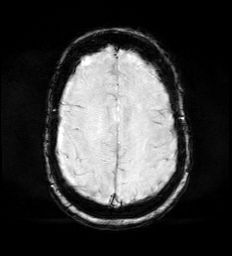

[Series 13: T1 · coronal · non-contrast · 3.0mm · 0.21mm/px · 1 of 13 slices shown (2 of 3)]
[im 1/13]
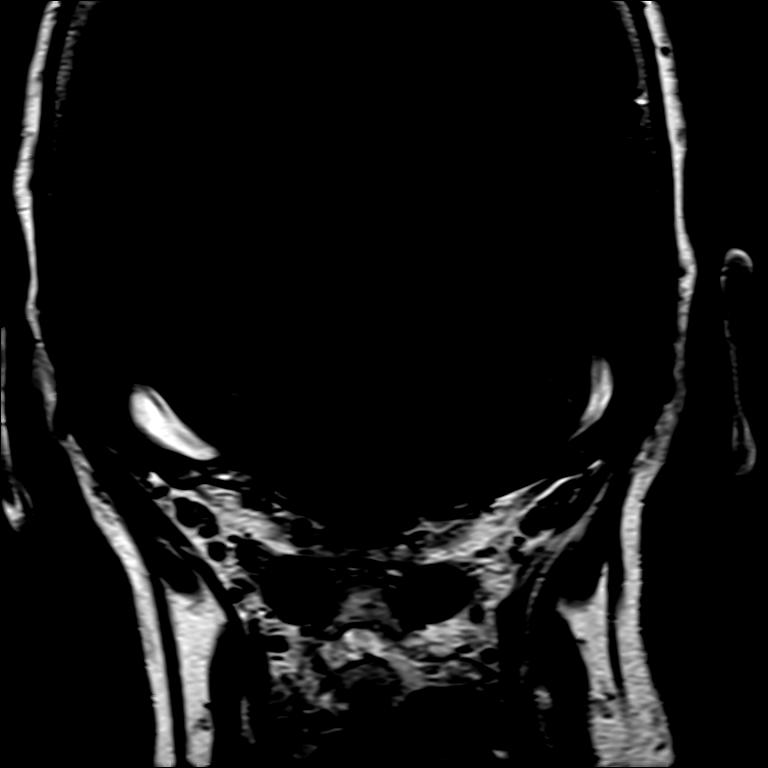

[Series 14: FLAIR · axial · 3.0mm · 0.53mm/px · z∈[-81,+77]mm · 4 of 55 slices shown]
[im 1/55]
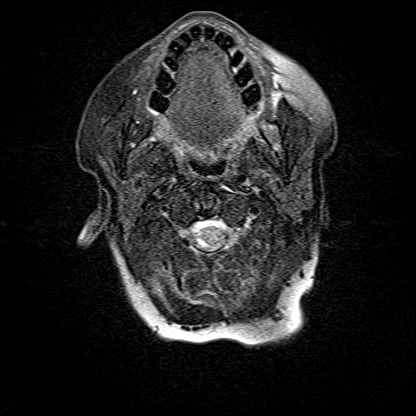
[im 19/55]
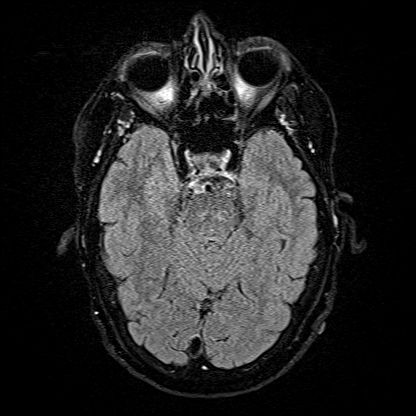
[im 37/55]
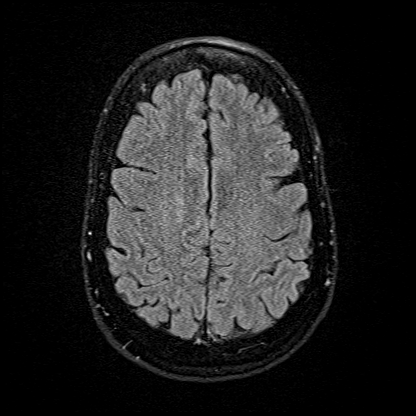
[im 55/55]
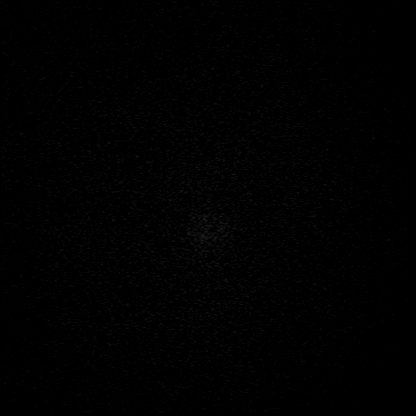

[Series 16: T1 · axial · non-contrast · 3.0mm · 0.21mm/px · 1 of 15 slices shown (3 of 3)]
[im 1/15]
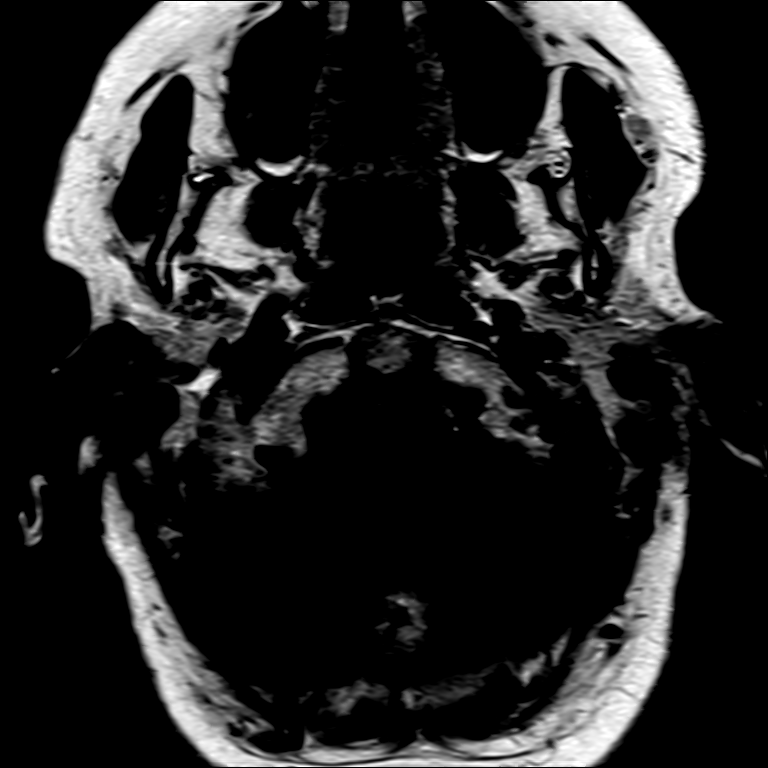

[Series 17: T1 post-contrast · axial · 3.0mm · 0.21mm/px · 1 of 15 slices shown (1 of 3)]
[im 1/15]
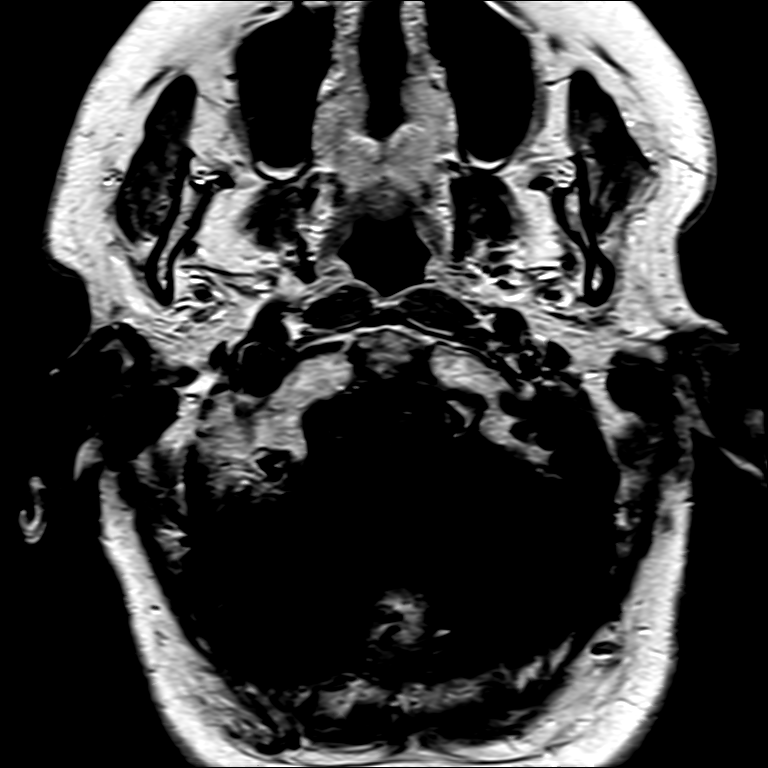

[Series 18: T1 post-contrast · coronal · 3.0mm · 0.21mm/px · 1 of 13 slices shown (2 of 3)]
[im 1/13]
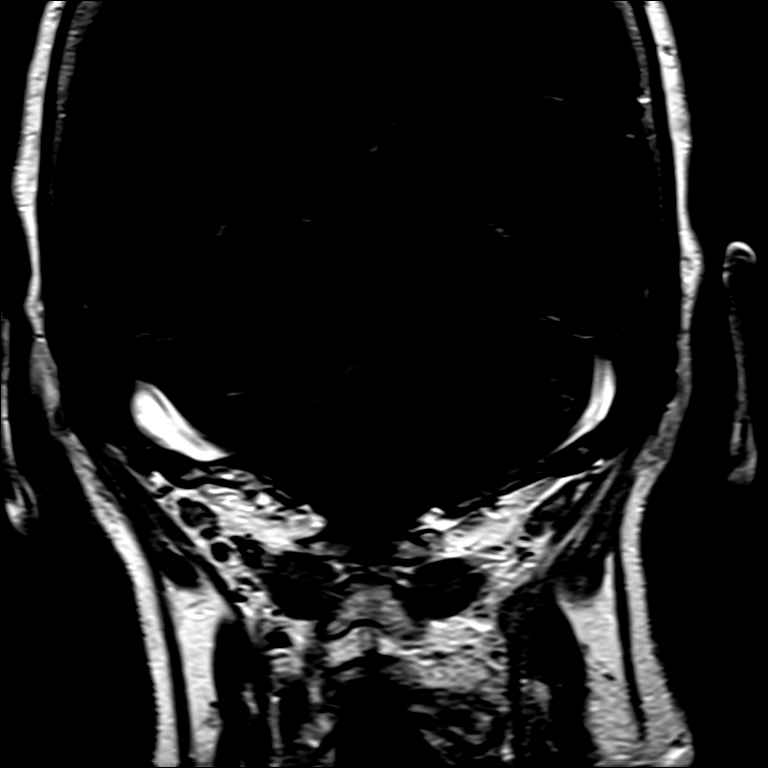

[Series 19: T1 post-contrast · axial · 1.0mm · 0.98mm/px · z∈[-85,+86]mm · 9 of 176 slices shown (3 of 3)]
[im 1/176]
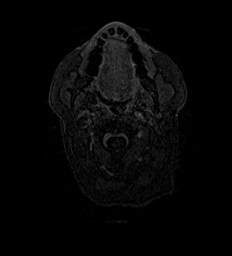
[im 32/176]
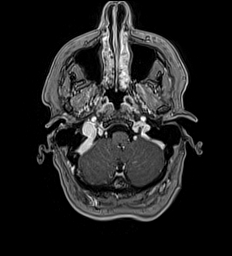
[im 48/176]
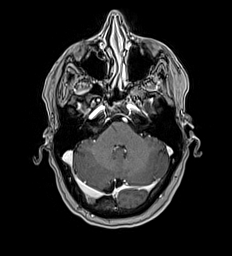
[im 80/176]
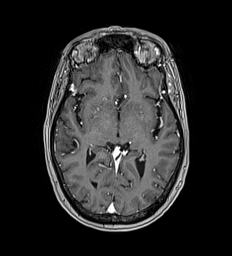
[im 96/176]
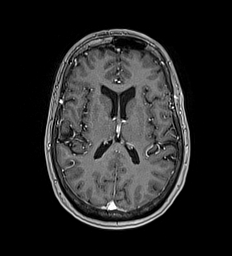
[im 128/176]
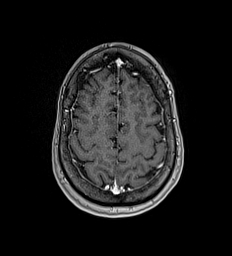
[im 144/176]
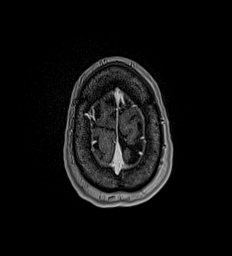
[im 160/176]
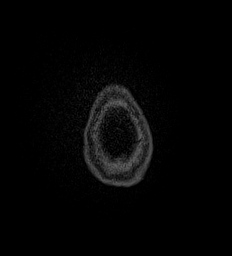
[im 176/176]
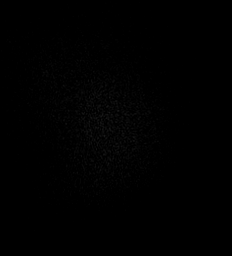

[28 of 48 positions shown; findings below may reference images not displayed]

FINDINGS: The study is mildly motion degraded.

Brain: There is no evidence of an acute infarct, intracranial
hemorrhage, mass, midline shift, or extra-axial fluid collection.
The ventricles and sulci are normal. Mild T2 hyperintensity in the
pons is nonspecific though most often seen with chronic small vessel
ischemia. No significant supratentorial white matter disease is seen
for age. No abnormal brain parenchymal or meningeal enhancement is
identified. A small developmental venous anomaly is incidentally
noted in the right parietal region.

Dedicated imaging through the internal auditory canals demonstrates
a normal course of cranial nerves VII and VIII without evidence of a
mass or abnormal enhancement. The inner ear structures demonstrate
normal signal and morphology bilaterally. No mass is present in the
cerebellopontine angles.

Vascular: Major intracranial vascular flow voids are preserved.

Skull and upper cervical spine: Unremarkable bone marrow signal.

Sinuses/Orbits: Bilateral cataract extraction. Paranasal sinuses and
mastoid air cells are clear.

Other: None.
IMPRESSION: 1. Negative internal auditory canal imaging.
2. No acute intracranial abnormality.
3. Mild chronic small vessel ischemic disease.

## 2020-09-14 MED ORDER — GADOBUTROL 1 MMOL/ML IV SOLN
7.5000 mL | Freq: Once | INTRAVENOUS | Status: AC | PRN
  Administered 2020-09-14: 7.5 mL via INTRAVENOUS

## 2020-09-19 ENCOUNTER — Ambulatory Visit (INDEPENDENT_AMBULATORY_CARE_PROVIDER_SITE_OTHER): Payer: PPO | Admitting: Internal Medicine

## 2020-09-19 ENCOUNTER — Other Ambulatory Visit: Payer: Self-pay

## 2020-09-19 VITALS — BP 126/80 | HR 76 | Temp 98.7°F | Resp 16 | Ht 67.0 in | Wt 175.8 lb

## 2020-09-19 DIAGNOSIS — H9190 Unspecified hearing loss, unspecified ear: Secondary | ICD-10-CM

## 2020-09-19 DIAGNOSIS — R5383 Other fatigue: Secondary | ICD-10-CM

## 2020-09-19 DIAGNOSIS — J4520 Mild intermittent asthma, uncomplicated: Secondary | ICD-10-CM

## 2020-09-19 DIAGNOSIS — Z86010 Personal history of colonic polyps: Secondary | ICD-10-CM | POA: Diagnosis not present

## 2020-09-19 DIAGNOSIS — F439 Reaction to severe stress, unspecified: Secondary | ICD-10-CM

## 2020-09-19 DIAGNOSIS — E7800 Pure hypercholesterolemia, unspecified: Secondary | ICD-10-CM

## 2020-09-19 LAB — HEPATIC FUNCTION PANEL
ALT: 13 U/L (ref 0–35)
AST: 15 U/L (ref 0–37)
Albumin: 4.3 g/dL (ref 3.5–5.2)
Alkaline Phosphatase: 61 U/L (ref 39–117)
Bilirubin, Direct: 0.1 mg/dL (ref 0.0–0.3)
Total Bilirubin: 0.9 mg/dL (ref 0.2–1.2)
Total Protein: 6.9 g/dL (ref 6.0–8.3)

## 2020-09-19 LAB — LIPID PANEL
Cholesterol: 205 mg/dL — ABNORMAL HIGH (ref 0–200)
HDL: 48.4 mg/dL (ref >39.00)
LDL Cholesterol: 125 mg/dL — ABNORMAL HIGH (ref 0–99)
NonHDL: 156.36
Total CHOL/HDL Ratio: 4
Triglycerides: 158 mg/dL — ABNORMAL HIGH (ref 0.0–149.0)
VLDL: 31.6 mg/dL (ref 0.0–40.0)

## 2020-09-19 LAB — CBC WITH DIFFERENTIAL/PLATELET
Basophils Absolute: 0 10*3/uL (ref 0.0–0.1)
Basophils Relative: 0.6 % (ref 0.0–3.0)
Eosinophils Absolute: 0 10*3/uL (ref 0.0–0.7)
Eosinophils Relative: 0.1 % (ref 0.0–5.0)
HCT: 40.6 % (ref 36.0–46.0)
Hemoglobin: 13.5 g/dL (ref 12.0–15.0)
Lymphocytes Relative: 24.1 % (ref 12.0–46.0)
Lymphs Abs: 1.5 10*3/uL (ref 0.7–4.0)
MCHC: 33.3 g/dL (ref 30.0–36.0)
MCV: 88.3 fl (ref 78.0–100.0)
Monocytes Absolute: 0.4 10*3/uL (ref 0.1–1.0)
Monocytes Relative: 5.9 % (ref 3.0–12.0)
Neutro Abs: 4.4 10*3/uL (ref 1.4–7.7)
Neutrophils Relative %: 69.3 % (ref 43.0–77.0)
Platelets: 290 10*3/uL (ref 150.0–400.0)
RBC: 4.6 Mil/uL (ref 3.87–5.11)
RDW: 13.5 % (ref 11.5–15.5)
WBC: 6.4 10*3/uL (ref 4.0–10.5)

## 2020-09-19 LAB — TSH: TSH: 2.04 u[IU]/mL (ref 0.35–4.50)

## 2020-09-19 LAB — BASIC METABOLIC PANEL
BUN: 10 mg/dL (ref 6–23)
CO2: 30 mEq/L (ref 19–32)
Calcium: 10 mg/dL (ref 8.4–10.5)
Chloride: 102 mEq/L (ref 96–112)
Creatinine, Ser: 0.62 mg/dL (ref 0.40–1.20)
GFR: 91.76 mL/min (ref >60.00)
Glucose, Bld: 94 mg/dL (ref 70–99)
Potassium: 4.9 mEq/L (ref 3.5–5.1)
Sodium: 141 mEq/L (ref 135–145)

## 2020-09-19 NOTE — Progress Notes (Signed)
Patient ID: Lynn Mann, female   DOB: [birth year], 70 y.o.   MRN: [id number]   Subjective:    Patient ID: Lynn Mann, female    DOB: 31-Dec-1950, 70 y.o.   MRN: [id number]  HPI This visit occurred during the SARS-CoV-2 public health emergency.  Safety protocols were in place, including screening questions prior to the visit, additional usage of staff PPE, and extensive cleaning of exam room while observing appropriate contact time as indicated for disinfecting solutions.  Patient here for a scheduled follow up.  Here to follow up regarding increased stress.  Husband passed unexpectedly 03/26/20.  Meeting with a ladies group.  Seeing a counselor q 2 weeks.  Saw ENT for decreased hearing and light headedness. MRI - mild chronic small vessel ischemic disease.  No acute abnormality.  Appears to be doing better.  No chest pain reported.  Saw cardiology.  ECHO - no significant abnormality.  Sleep study - no sleep apnea.  No evidence of arrhythmia.  CXR and stress echo - unremarkable.   Eating.  No abdominal pain or bowel change reported.  Blood pressure doing well.   Past Medical History:  Diagnosis Date  . Arthritis   . C. difficile diarrhea    age 38s-40s   . Chicken pox   . Cholecystitis 11/2011   Did not require sgy - Dr. Staci Acosta - [facility]  (cholelithiasis)  . H/O Clostridium difficile infection   . IBS (irritable bowel syndrome)   . MRSA exposure 2005   Spider bite  . MVP (mitral valve prolapse)    Stable - Dr. Ubaldo Glassing  . Rheumatic fever    Past Surgical History:  Procedure Laterality Date  . CATARACT EXTRACTION  [id number]  . MUSCLE BIOPSY     Family History  Problem Relation Age of Onset  . Arthritis Mother   . Stroke Mother   . Diabetes Mother   . Hypertension Mother   . Arthritis Father   . Stroke Father   . Diabetes Paternal Grandmother   . Cancer Paternal Uncle        colon  . Heart disease Other        maternal and paternal side  . Breast cancer Paternal Aunt   .  Breast cancer Cousin   . Breast cancer Cousin        female cousin  . Breast cancer Cousin    Social History   Socioeconomic History  . Marital status: Widowed    Spouse name: Not on file  . Number of children: Not on file  . Years of education: 64  . Highest education level: Not on file  Occupational History  . Occupation: Retired  Tobacco Use  . Smoking status: Never Smoker  . Smokeless tobacco: Never Used  Vaping Use  . Vaping Use: Never used  Substance and Sexual Activity  . Alcohol use: Yes    Alcohol/week: 0.0 standard drinks    Comment: Rarely  . Drug use: No  . Sexual activity: Yes    Partners: Male    Birth control/protection: Post-menopausal  Other Topics Concern  . Not on file  Social History Narrative  . Not on file   Social Determinants of Health   Financial Resource Strain: Low Risk   . Difficulty of Paying Living Expenses: Not hard at all  Food Insecurity: No Food Insecurity  . Worried About Charity fundraiser in the Last Year: Never true  . Ran Out of Food in the Last  Year: Never true  Transportation Needs: No Transportation Needs  . Lack of Transportation (Medical): No  . Lack of Transportation (Non-Medical): No  Physical Activity: Not on file  Stress: No Stress Concern Present  . Feeling of Stress : Not at all  Social Connections: Unknown  . Frequency of Communication with Friends and Family: More than three times a week  . Frequency of Social Gatherings with Friends and Family: More than three times a week  . Attends Religious Services: Not on file  . Active Member of Clubs or Organizations: Not on file  . Attends Archivist Meetings: Not on file  . Marital Status: Widowed    Outpatient Encounter Medications as of 09/19/2020  Medication Sig  . Multiple Vitamins-Minerals (PRESERVISION AREDS 2 PO) Take by mouth.  Marland Kitchen albuterol (VENTOLIN HFA) 108 (90 Base) MCG/ACT inhaler Inhale 2 puffs into the lungs every 6 (six) hours as needed for  wheezing or shortness of breath.  Marland Kitchen aspirin 325 MG tablet Take by mouth.   No facility-administered encounter medications on file as of 09/19/2020.    Review of Systems  Constitutional:       Eating.  Has lost some weight.   HENT: Negative for congestion and sinus pressure.   Respiratory: Negative for cough, chest tightness and shortness of breath.   Cardiovascular: Negative for chest pain, palpitations and leg swelling.  Gastrointestinal: Negative for abdominal pain, diarrhea, nausea and vomiting.  Genitourinary: Negative for difficulty urinating and dysuria.  Musculoskeletal: Negative for joint swelling and myalgias.  Skin: Negative for color change and rash.  Neurological: Negative for dizziness, light-headedness and headaches.  Psychiatric/Behavioral: Negative for agitation and dysphoric mood.       Increased stress as outlined.        Objective:    Physical Exam Vitals reviewed.  Constitutional:      General: She is not in acute distress.    Appearance: Normal appearance.  HENT:     Head: Normocephalic and atraumatic.     Right Ear: External ear normal.     Left Ear: External ear normal.     Mouth/Throat:     Mouth: Oropharynx is clear and moist.  Eyes:     General: No scleral icterus.       Right eye: No discharge.        Left eye: No discharge.     Conjunctiva/sclera: Conjunctivae normal.  Neck:     Thyroid: No thyromegaly.  Cardiovascular:     Rate and Rhythm: Normal rate and regular rhythm.  Pulmonary:     Effort: No respiratory distress.     Breath sounds: Normal breath sounds. No wheezing.  Abdominal:     General: Bowel sounds are normal.     Palpations: Abdomen is soft.     Tenderness: There is no abdominal tenderness.  Musculoskeletal:        General: No swelling, tenderness or edema.     Cervical back: Neck supple. No tenderness.  Lymphadenopathy:     Cervical: No cervical adenopathy.  Skin:    Findings: No erythema or rash.  Neurological:      Mental Status: She is alert.  Psychiatric:        Mood and Affect: Mood normal.        Behavior: Behavior normal.     BP 126/80   Pulse 76   Temp 98.7 F (37.1 C) (Oral)   Resp 16   Ht 5\' 7"  (1.702 m)   Wt 175  lb 12.8 oz (79.7 kg)   SpO2 97%   BMI 27.53 kg/m  Wt Readings from Last 3 Encounters:  09/19/20 175 lb 12.8 oz (79.7 kg)  06/26/20 186 lb (84.4 kg)  06/20/20 186 lb 6.4 oz (84.6 kg)     Lab Results  Component Value Date   WBC 6.4 09/19/2020   HGB 13.5 09/19/2020   HCT 40.6 09/19/2020   PLT 290.0 09/19/2020   GLUCOSE 94 09/19/2020   CHOL 205 (H) 09/19/2020   TRIG 158.0 (H) 09/19/2020   HDL 48.40 09/19/2020   LDLDIRECT 110.0 02/11/2017   LDLCALC 125 (H) 09/19/2020   ALT 13 09/19/2020   AST 15 09/19/2020   NA 141 09/19/2020   K 4.9 09/19/2020   CL 102 09/19/2020   CREATININE 0.62 09/19/2020   BUN 10 09/19/2020   CO2 30 09/19/2020   TSH 2.04 09/19/2020   HGBA1C 5.4 07/15/2017    MR BRAIN/IAC W WO CONTRAST  Result Date: 09/14/2020 CLINICAL DATA:  Dizziness. EXAM: MRI HEAD WITHOUT AND WITH CONTRAST TECHNIQUE: Multiplanar, multiecho pulse sequences of the brain and surrounding structures were obtained without and with intravenous contrast. CONTRAST:  7.23mL GADAVIST GADOBUTROL 1 MMOL/ML IV SOLN COMPARISON:  Head CT 12/26/2015 FINDINGS: The study is mildly motion degraded. Brain: There is no evidence of an acute infarct, intracranial hemorrhage, mass, midline shift, or extra-axial fluid collection. The ventricles and sulci are normal. Mild T2 hyperintensity in the pons is nonspecific though most often seen with chronic small vessel ischemia. No significant supratentorial white matter disease is seen for age. No abnormal brain parenchymal or meningeal enhancement is identified. A small developmental venous anomaly is incidentally noted in the right parietal region. Dedicated imaging through the internal auditory canals demonstrates a normal course of cranial nerves VII  and VIII without evidence of a mass or abnormal enhancement. The inner ear structures demonstrate normal signal and morphology bilaterally. No mass is present in the cerebellopontine angles. Vascular: Major intracranial vascular flow voids are preserved. Skull and upper cervical spine: Unremarkable bone marrow signal. Sinuses/Orbits: Bilateral cataract extraction. Paranasal sinuses and mastoid air cells are clear. Other: None. IMPRESSION: 1. Negative internal auditory canal imaging. 2. No acute intracranial abnormality. 3. Mild chronic small vessel ischemic disease. Electronically Signed   By: Logan Bores M.D.   On: 09/14/2020 15:48       Assessment & Plan:   Problem List Items Addressed This Visit    Asthma    Breathing stable.  Has albuterol to use prn.  Follow.       Fatigue    Reports increased fatigue.  Had extensive cardiac w/up as outlined.  Negative stress echo and essentially negative echo.  No sleep apnea.  No arrhythmia.  Check labs, including cbc, metabolic panel and tsh.  Treat stress - counseling.  Follow.  Wants to monitor.        Relevant Orders   CBC with Differential/Platelet (Completed)   TSH (Completed)   Hepatic function panel (Completed)   Basic metabolic panel (Completed)   Hearing loss    Seeing ENT. Obtain records.       History of colonic polyps    Colonoscopy 08/2017 - tubular adenoma.  Recommended f/u colonoscopy in 5 years.       Hyperbilirubinemia    Bilirubin has been elevated. Direct bilirubin wnl.  Will recheck today to confirm wnl.       Hypercholesterolemia - Primary    Low cholesterol diet and exercise.  Follow lipid panel.  Relevant Orders   Lipid panel (Completed)   Stress    Increased stress as outlined.  Seeing a Social worker.  Does not feel needs further intervention.  Follow.            Einar Pheasant, MD

## 2020-09-24 ENCOUNTER — Encounter: Payer: Self-pay | Admitting: Internal Medicine

## 2020-09-24 NOTE — Assessment & Plan Note (Signed)
Reports increased fatigue.  Had extensive cardiac w/up as outlined.  Negative stress echo and essentially negative echo.  No sleep apnea.  No arrhythmia.  Check labs, including cbc, metabolic panel and tsh.  Treat stress - counseling.  Follow.  Wants to monitor.

## 2020-09-24 NOTE — Assessment & Plan Note (Signed)
Bilirubin has been elevated. Direct bilirubin wnl.  Will recheck today to confirm wnl.

## 2020-09-24 NOTE — Assessment & Plan Note (Signed)
Colonoscopy 08/2017 - tubular adenoma.  Recommended f/u colonoscopy in 5 years.  

## 2020-09-24 NOTE — Assessment & Plan Note (Signed)
Low cholesterol diet and exercise.  Follow lipid panel.   

## 2020-09-24 NOTE — Assessment & Plan Note (Signed)
Seeing ENT. Obtain records.

## 2020-09-24 NOTE — Assessment & Plan Note (Signed)
Increased stress as outlined.  Seeing a Social worker.  Does not feel needs further intervention.  Follow.

## 2020-09-24 NOTE — Assessment & Plan Note (Signed)
Breathing stable.  Has albuterol to use prn.  Follow.

## 2020-11-02 ENCOUNTER — Telehealth: Payer: Self-pay | Admitting: Internal Medicine

## 2020-11-02 NOTE — Telephone Encounter (Signed)
Pt called you back-ringer does not work on her phone

## 2020-11-02 NOTE — Telephone Encounter (Signed)
Called patient. Unable to leave message.

## 2020-11-02 NOTE — Telephone Encounter (Signed)
Patient was calling about the covid antibody test. Decided she does not want this done at this time. She is going to check with her insurance first.

## 2020-11-02 NOTE — Telephone Encounter (Signed)
Patient called in wanted to know discuss covid test antibiotics

## 2020-11-22 ENCOUNTER — Other Ambulatory Visit: Payer: Self-pay

## 2020-11-22 ENCOUNTER — Ambulatory Visit (INDEPENDENT_AMBULATORY_CARE_PROVIDER_SITE_OTHER): Payer: PPO

## 2020-11-22 ENCOUNTER — Ambulatory Visit (INDEPENDENT_AMBULATORY_CARE_PROVIDER_SITE_OTHER): Payer: PPO | Admitting: Internal Medicine

## 2020-11-22 ENCOUNTER — Encounter: Payer: Self-pay | Admitting: Internal Medicine

## 2020-11-22 DIAGNOSIS — F439 Reaction to severe stress, unspecified: Secondary | ICD-10-CM

## 2020-11-22 DIAGNOSIS — M25473 Effusion, unspecified ankle: Secondary | ICD-10-CM | POA: Diagnosis not present

## 2020-11-22 DIAGNOSIS — M545 Low back pain, unspecified: Secondary | ICD-10-CM | POA: Insufficient documentation

## 2020-11-22 DIAGNOSIS — M5441 Lumbago with sciatica, right side: Secondary | ICD-10-CM

## 2020-11-22 DIAGNOSIS — J4520 Mild intermittent asthma, uncomplicated: Secondary | ICD-10-CM | POA: Diagnosis not present

## 2020-11-22 DIAGNOSIS — E7800 Pure hypercholesterolemia, unspecified: Secondary | ICD-10-CM | POA: Diagnosis not present

## 2020-11-22 DIAGNOSIS — M25551 Pain in right hip: Secondary | ICD-10-CM | POA: Diagnosis not present

## 2020-11-22 IMAGING — DX DG LUMBAR SPINE 2-3V
3 series · 3 of 3 positions shown · non-contrast
Comparison: CT and pelvis [DATE], lumbar spine radiograph
[DATE]

CLINICAL DATA: persistent low back pain, recent fall. right hip and
leg pain

EXAM:
LUMBAR SPINE - 2-3 VIEW

[lumbar spine ap]
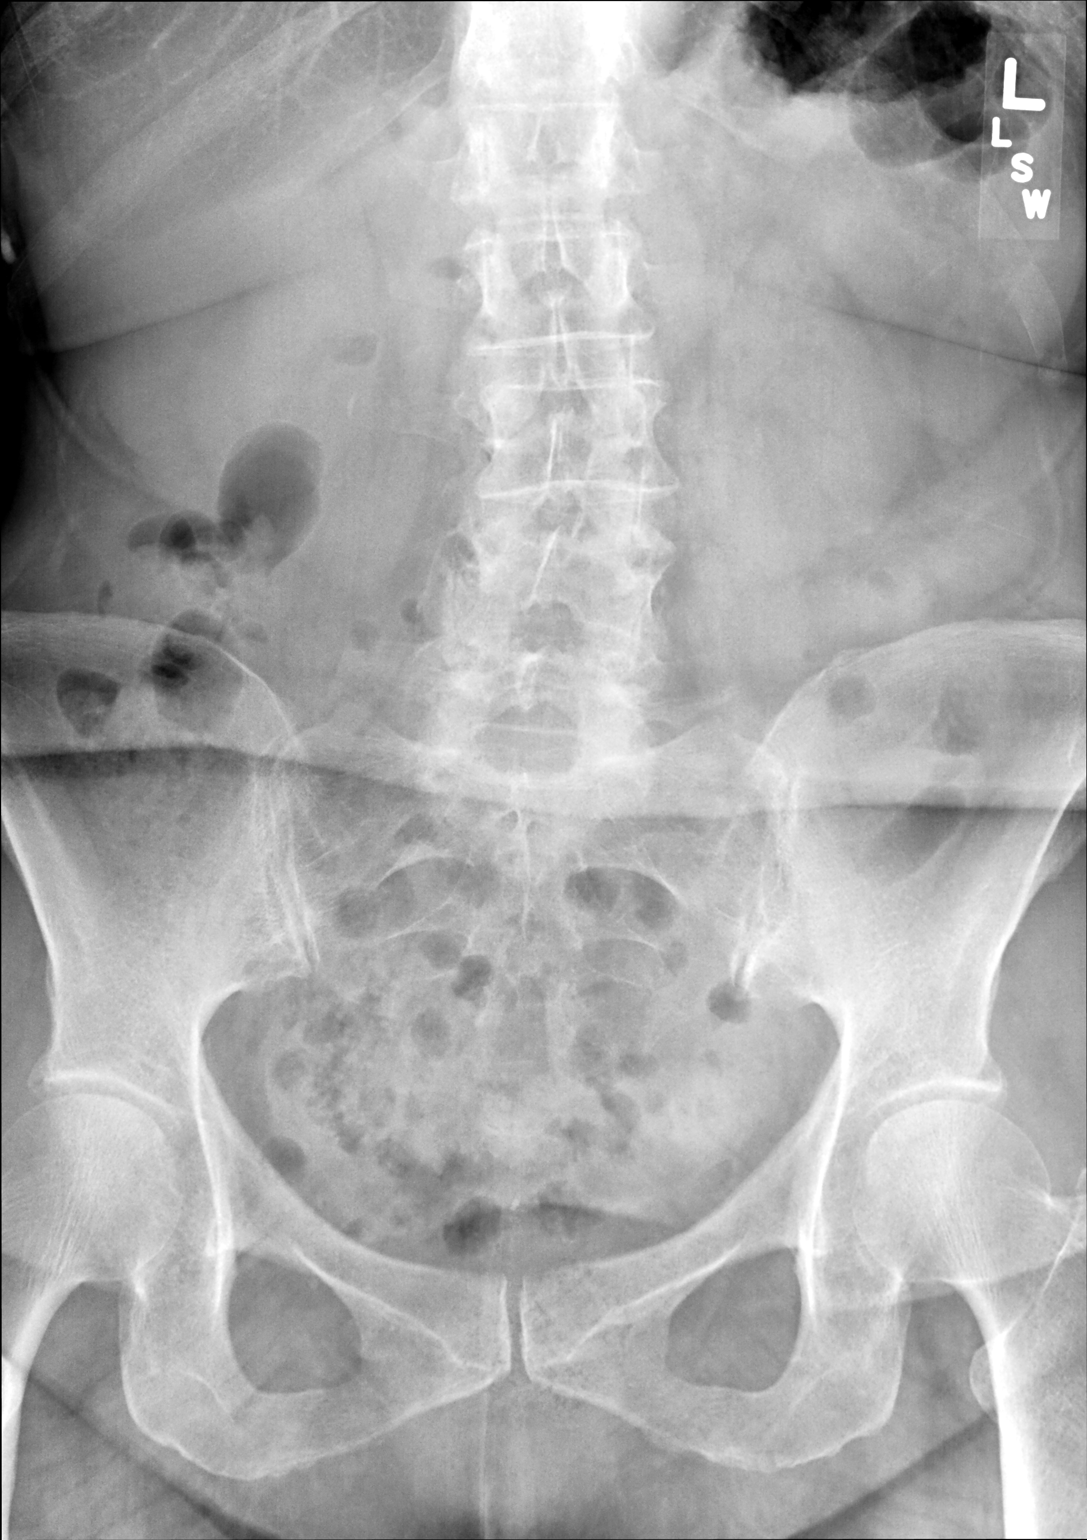

[lumbar spine lat]
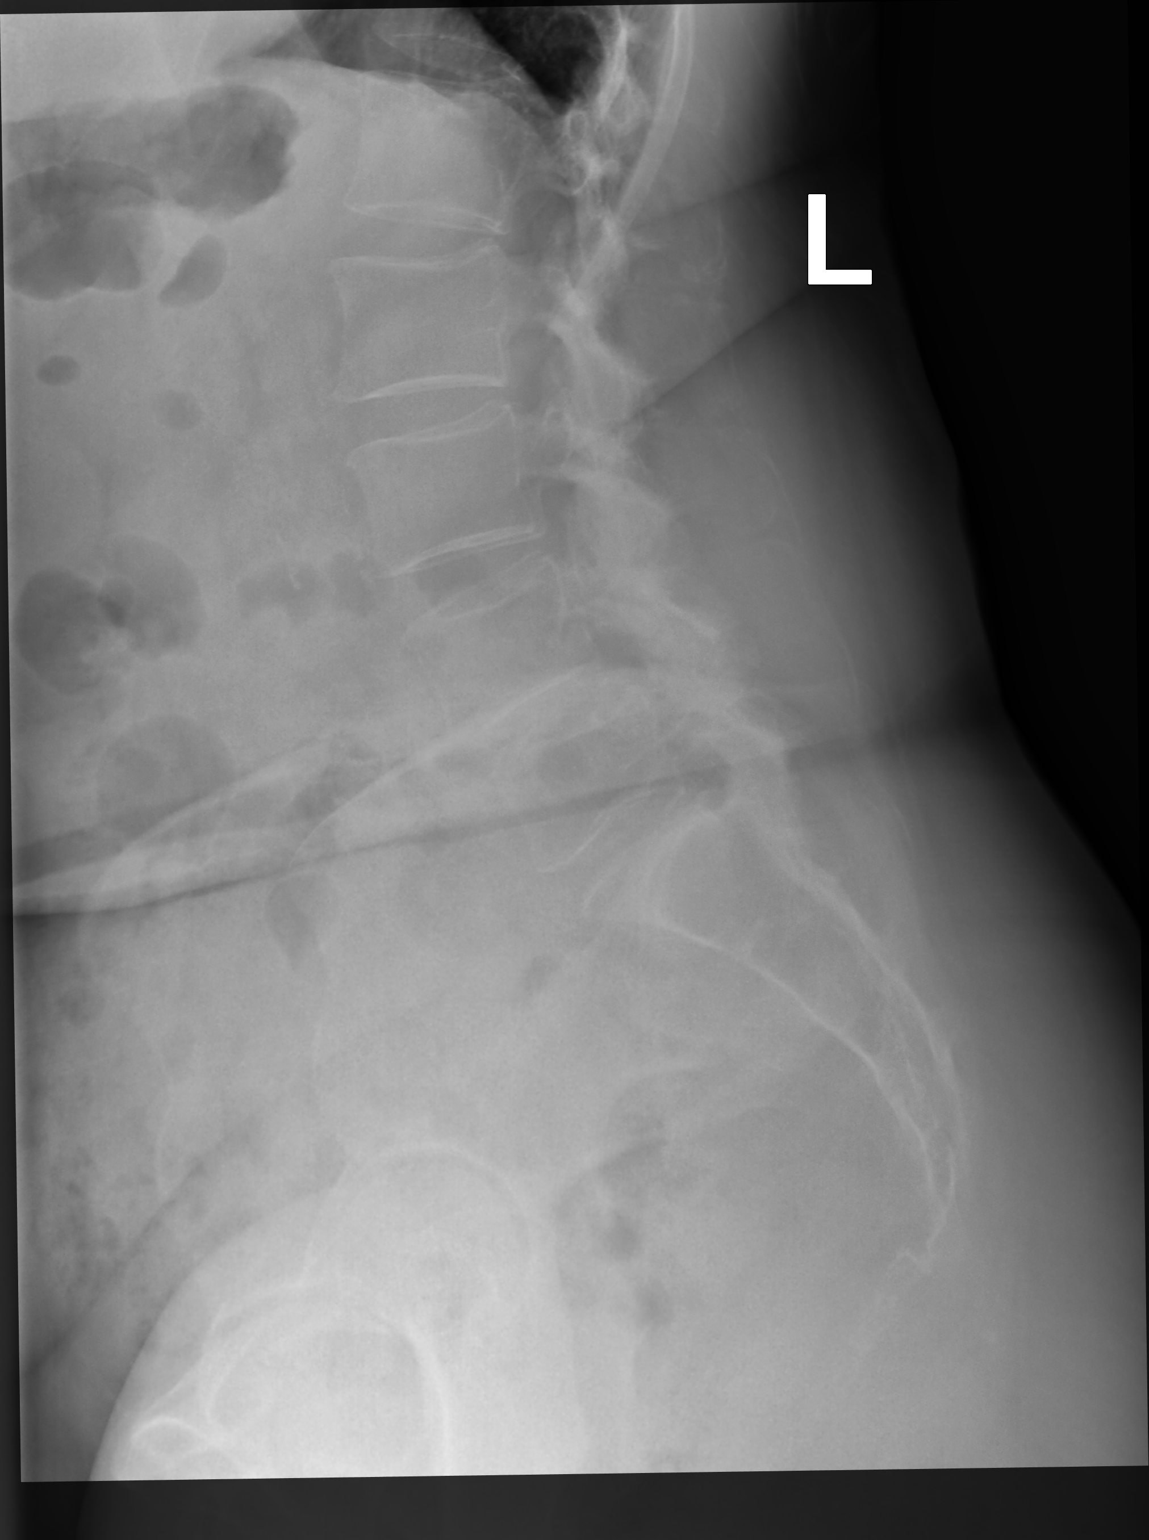

[lumbar spot lat]
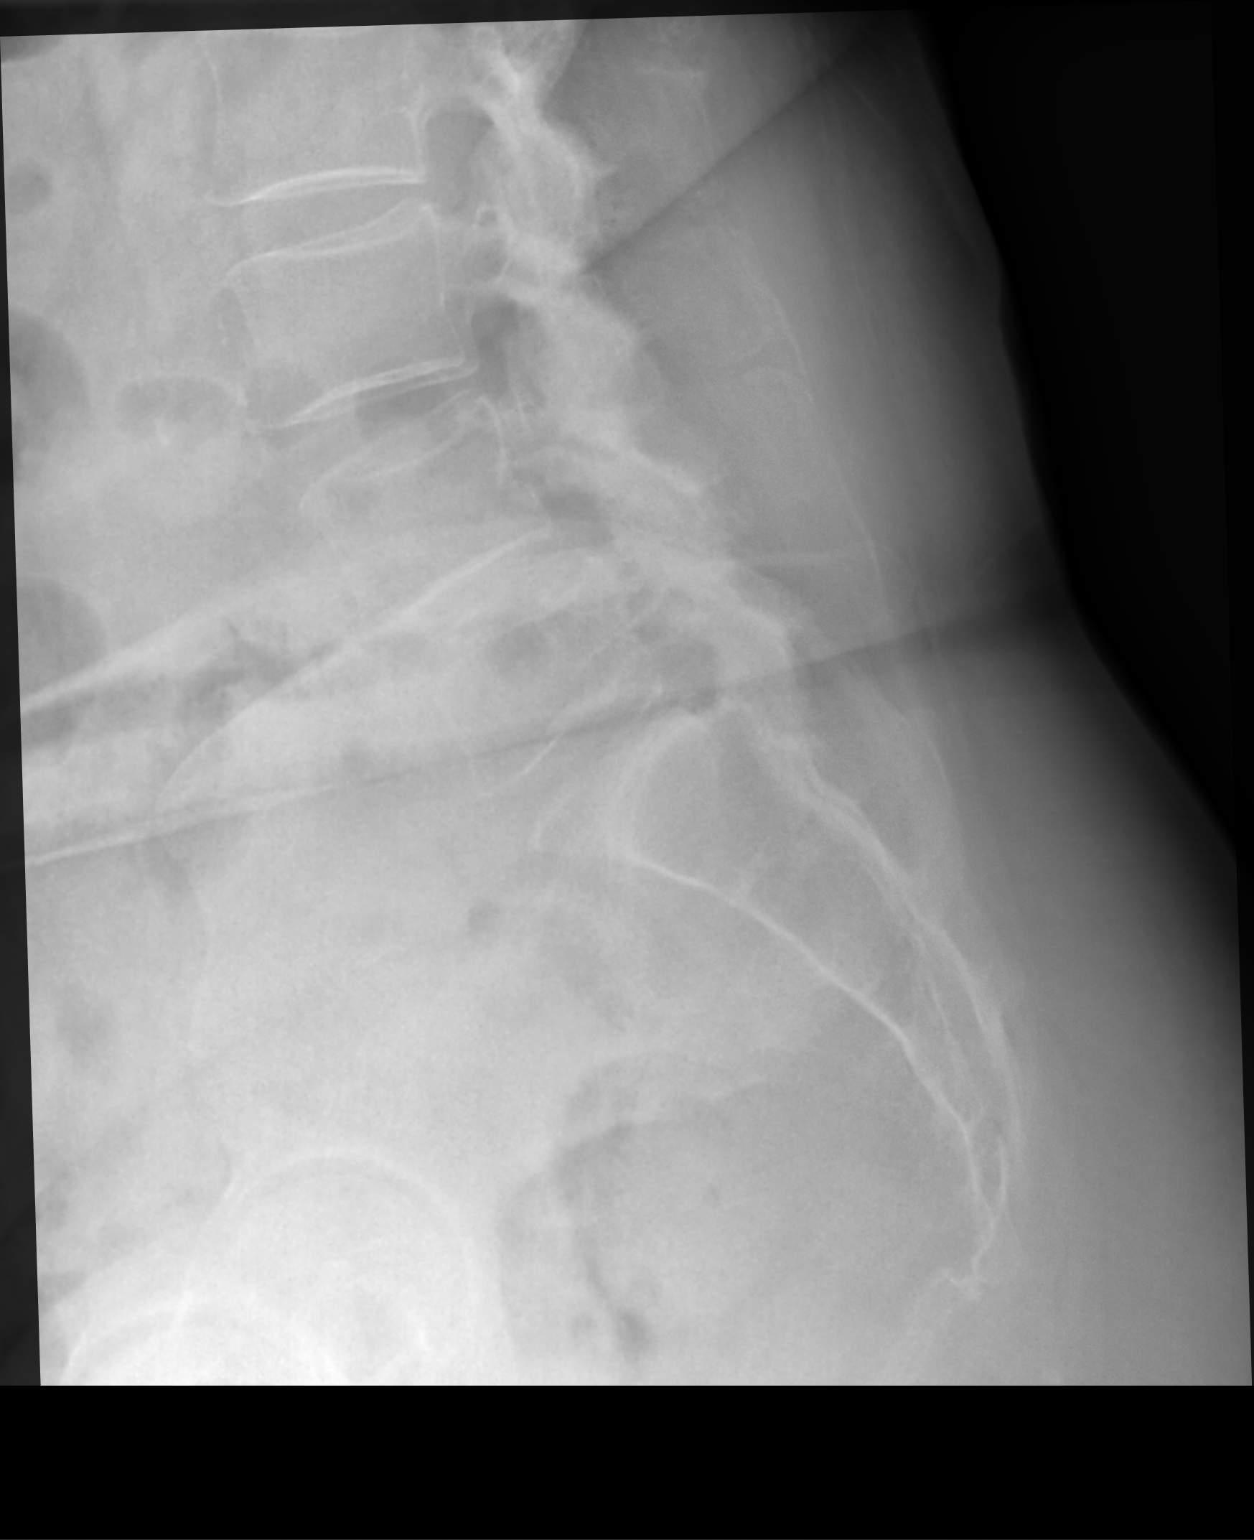

[3 of 3 positions shown; findings below may reference images not displayed]

FINDINGS: Five non rib-bearing lumbar vertebrae. Slight levoconvex curvature.
There is unchanged moderate disc height loss at L4-L5 with grade 1
anterolisthesis at this level. There is moderate lower lumbar
predominant facet arthritis. There is no evidence of fracture.
IMPRESSION: Unchanged moderate disc height loss at L4-L5 with degenerative grade
1 anterolisthesis at this level. Moderate lower lumbar predominant
facet arthritis.

## 2020-11-22 IMAGING — DX DG HIP (WITH OR WITHOUT PELVIS) 2-3V*R*
3 series · 3 of 3 positions shown · non-contrast
Comparison: [DATE] right hip radiographs

CLINICAL DATA: Right hip pain, recent fall

EXAM:
DG HIP (WITH OR WITHOUT PELVIS) 2-3V RIGHT

[pelvis ap]
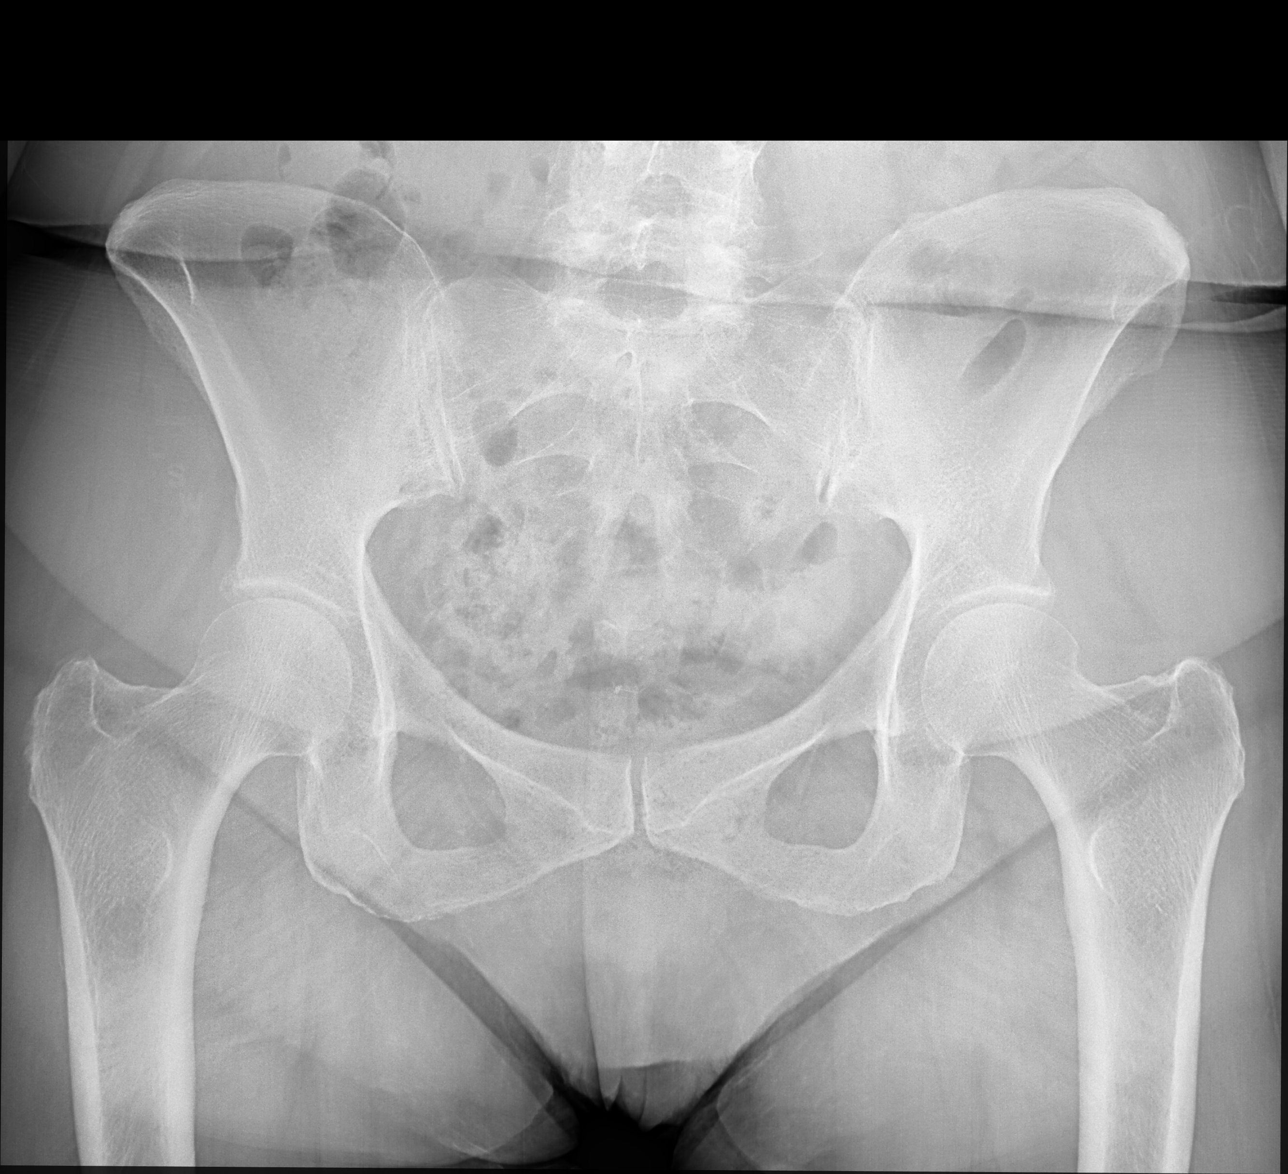

[hip joint ap]
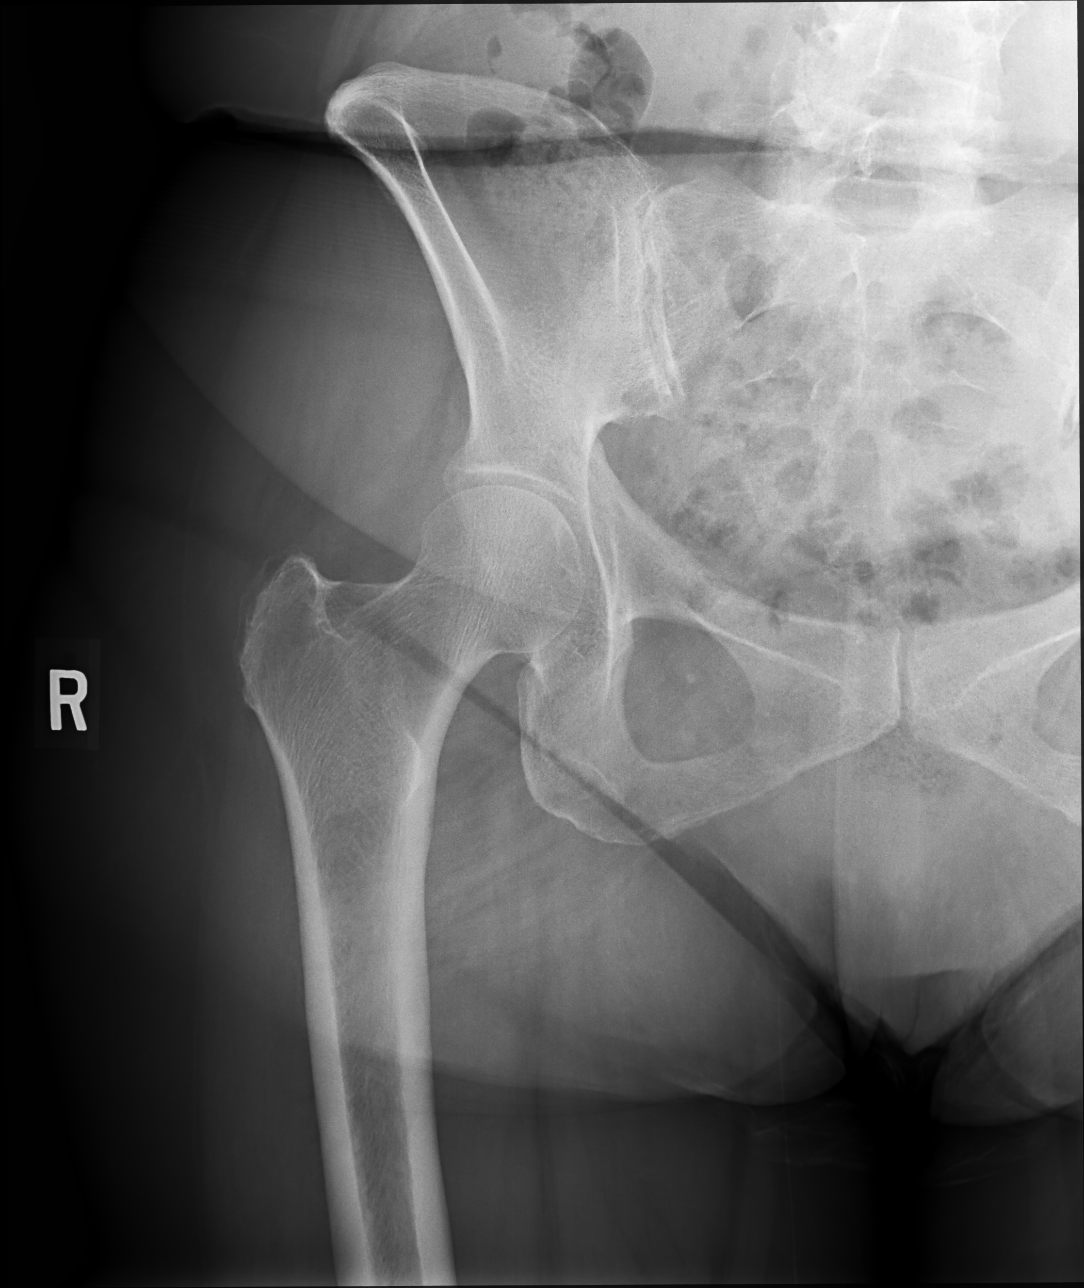

[ap frog obl (oblique)]
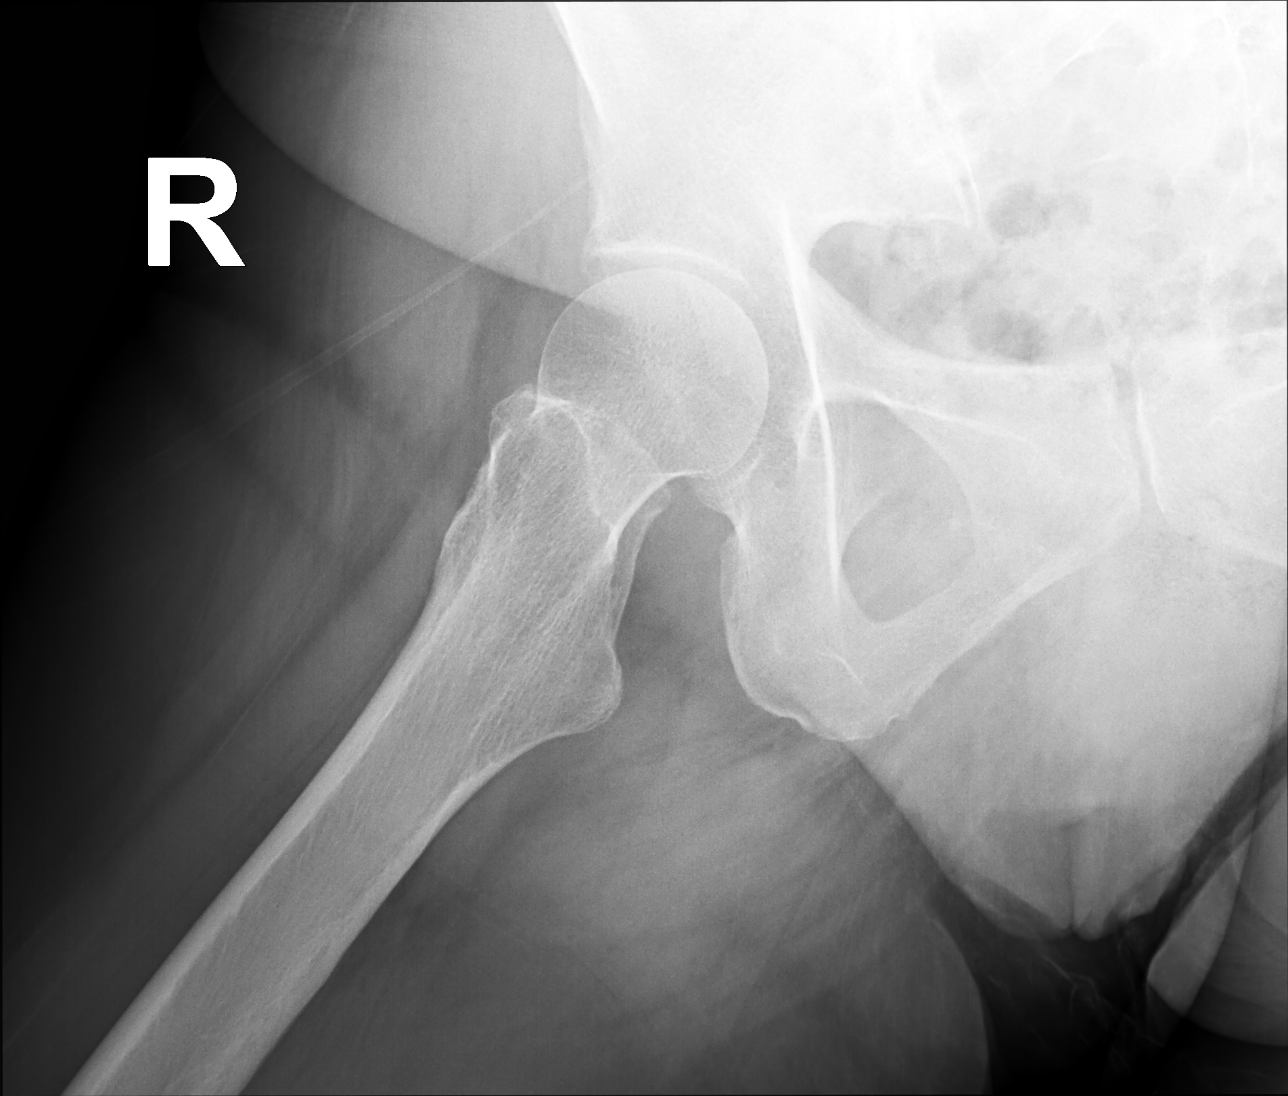

[3 of 3 positions shown; findings below may reference images not displayed]

FINDINGS: No pelvic fracture or diastasis. No right hip fracture or
dislocation. No focal osseous lesions. No significant arthropathy.
Degenerative changes in the visualized lower lumbar spine. No
radiopaque foreign bodies.
IMPRESSION: No fracture or malalignment in the right hip.

## 2020-11-22 NOTE — Progress Notes (Signed)
Patient ID: Lynn Mann, female   DOB: 01-Jun-1951, 70 y.o.   MRN: [id number]   Subjective:    Patient ID: Lynn Mann, female    DOB: [birth year], 70 y.o.   MRN: [id number]  HPI This visit occurred during the SARS-CoV-2 public health emergency.  Safety protocols were in place, including screening questions prior to the visit, additional usage of staff PPE, and extensive cleaning of exam room while observing appropriate contact time as indicated for disinfecting solutions.  Patient here for a scheduled follow up. Here to follow up regarding increased stress.  Also reports increased pain - right groin and right buttock.  Husband passed unexpectedly 03/26/20.  She is still trying to cope with the stress from this.  Also trying to get all of the legal matters addressed.  Discussed.  She does not feel needs any further intervention.  Trying to stay active.  Increased pain - right buttock and right groin.  Hurts to get up and walk.  After she starts walking - feels some better.  Increased pain with stairs.  Some pain - posterior thigh.  Also reports some ankle swelling.  No leg swelling or redness.  No headache.  No chest pain.  Bowels moving.  Breathing stable.  Saw cardiology - ECHO - no significant abnormality.  Sleep study - no sleep apnea.  CXR and stress echo - unremarkable.  She is trying to adjust her diet.  Has not been eating well.    Past Medical History:  Diagnosis Date  . Arthritis   . C. difficile diarrhea    age 23s-40s   . Chicken pox   . Cholecystitis 11/2011   Did not require sgy - Dr. Staci Acosta - [facility]  (cholelithiasis)  . H/O Clostridium difficile infection   . IBS (irritable bowel syndrome)   . MRSA exposure 2005   Spider bite  . MVP (mitral valve prolapse)    Stable - Dr. Ubaldo Glassing  . Rheumatic fever    Past Surgical History:  Procedure Laterality Date  . CATARACT EXTRACTION  [id number]  . MUSCLE BIOPSY     Family History  Problem Relation Age of Onset  . Arthritis Mother    . Stroke Mother   . Diabetes Mother   . Hypertension Mother   . Arthritis Father   . Stroke Father   . Diabetes Paternal Grandmother   . Cancer Paternal Uncle        colon  . Heart disease Other        maternal and paternal side  . Breast cancer Paternal Aunt   . Breast cancer Cousin   . Breast cancer Cousin        female cousin  . Breast cancer Cousin    Social History   Socioeconomic History  . Marital status: Widowed    Spouse name: Not on file  . Number of children: Not on file  . Years of education: 14  . Highest education level: Not on file  Occupational History  . Occupation: Retired  Tobacco Use  . Smoking status: Never Smoker  . Smokeless tobacco: Never Used  Vaping Use  . Vaping Use: Never used  Substance and Sexual Activity  . Alcohol use: Yes    Alcohol/week: 0.0 standard drinks    Comment: Rarely  . Drug use: No  . Sexual activity: Yes    Partners: Male    Birth control/protection: Post-menopausal  Other Topics Concern  . Not on file  Social History Narrative  .  Not on file   Social Determinants of Health   Financial Resource Strain: Low Risk   . Difficulty of Paying Living Expenses: Not hard at all  Food Insecurity: No Food Insecurity  . Worried About Charity fundraiser in the Last Year: Never true  . Ran Out of Food in the Last Year: Never true  Transportation Needs: No Transportation Needs  . Lack of Transportation (Medical): No  . Lack of Transportation (Non-Medical): No  Physical Activity: Not on file  Stress: No Stress Concern Present  . Feeling of Stress : Not at all  Social Connections: Unknown  . Frequency of Communication with Friends and Family: More than three times a week  . Frequency of Social Gatherings with Friends and Family: More than three times a week  . Attends Religious Services: Not on file  . Active Member of Clubs or Organizations: Not on file  . Attends Archivist Meetings: Not on file  . Marital Status:  Widowed    Outpatient Encounter Medications as of 11/22/2020  Medication Sig  . albuterol (VENTOLIN HFA) 108 (90 Base) MCG/ACT inhaler Inhale 2 puffs into the lungs every 6 (six) hours as needed for wheezing or shortness of breath.  Marland Kitchen aspirin 325 MG tablet Take by mouth.  . Multiple Vitamins-Minerals (PRESERVISION AREDS 2 PO) Take by mouth.   No facility-administered encounter medications on file as of 11/22/2020.    Review of Systems  Constitutional: Negative for appetite change and unexpected weight change.  HENT: Negative for congestion and sinus pressure.   Respiratory: Negative for cough and chest tightness.        Breathing stable.   Cardiovascular: Negative for chest pain and palpitations.       Ankle swelling.    Gastrointestinal: Negative for abdominal pain, diarrhea, nausea and vomiting.  Genitourinary: Negative for difficulty urinating and dysuria.  Musculoskeletal: Negative for joint swelling.       Right groin and right buttock pain as outlined.    Skin: Negative for color change and rash.  Neurological: Negative for dizziness, light-headedness and headaches.  Psychiatric/Behavioral: Negative for agitation.       Increased stress as outlined.         Objective:    Physical Exam Vitals reviewed.  Constitutional:      General: She is not in acute distress.    Appearance: Normal appearance.  HENT:     Head: Normocephalic and atraumatic.     Right Ear: External ear normal.     Left Ear: External ear normal.  Eyes:     General: No scleral icterus.       Right eye: No discharge.        Left eye: No discharge.     Conjunctiva/sclera: Conjunctivae normal.  Neck:     Thyroid: No thyromegaly.  Cardiovascular:     Rate and Rhythm: Normal rate and regular rhythm.  Pulmonary:     Effort: No respiratory distress.     Breath sounds: Normal breath sounds. No wheezing.  Abdominal:     General: Bowel sounds are normal.     Palpations: Abdomen is soft.     Tenderness:  There is no abdominal tenderness.  Musculoskeletal:        General: No tenderness.     Cervical back: Neck supple. No tenderness.     Comments: Some increased right groin pain with abduction/adduction of lower extremity.  Some minimal discomfort left groin,,hip as well.  Negative SLR.  Motor  strength appears to be equal bilateral.   Lymphadenopathy:     Cervical: No cervical adenopathy.  Skin:    Findings: No erythema or rash.  Neurological:     Mental Status: She is alert.  Psychiatric:        Mood and Affect: Mood normal.        Behavior: Behavior normal.     BP 116/70   Pulse 97   Temp 98 F (36.7 C) (Oral)   Resp 16   Ht 5\' 7"  (1.702 m)   Wt 179 lb 3.2 oz (81.3 kg)   SpO2 98%   BMI 28.07 kg/m  Wt Readings from Last 3 Encounters:  11/22/20 179 lb 3.2 oz (81.3 kg)  09/19/20 175 lb 12.8 oz (79.7 kg)  06/26/20 186 lb (84.4 kg)     Lab Results  Component Value Date   WBC 6.4 09/19/2020   HGB 13.5 09/19/2020   HCT 40.6 09/19/2020   PLT 290.0 09/19/2020   GLUCOSE 94 09/19/2020   CHOL 205 (H) 09/19/2020   TRIG 158.0 (H) 09/19/2020   HDL 48.40 09/19/2020   LDLDIRECT 110.0 02/11/2017   LDLCALC 125 (H) 09/19/2020   ALT 13 09/19/2020   AST 15 09/19/2020   NA 141 09/19/2020   K 4.9 09/19/2020   CL 102 09/19/2020   CREATININE 0.62 09/19/2020   BUN 10 09/19/2020   CO2 30 09/19/2020   TSH 2.04 09/19/2020   HGBA1C 5.4 07/15/2017    MR BRAIN/IAC W WO CONTRAST  Result Date: 09/14/2020 CLINICAL DATA:  Dizziness. EXAM: MRI HEAD WITHOUT AND WITH CONTRAST TECHNIQUE: Multiplanar, multiecho pulse sequences of the brain and surrounding structures were obtained without and with intravenous contrast. CONTRAST:  7.109mL GADAVIST GADOBUTROL 1 MMOL/ML IV SOLN COMPARISON:  Head CT 12/26/2015 FINDINGS: The study is mildly motion degraded. Brain: There is no evidence of an acute infarct, intracranial hemorrhage, mass, midline shift, or extra-axial fluid collection. The ventricles and  sulci are normal. Mild T2 hyperintensity in the pons is nonspecific though most often seen with chronic small vessel ischemia. No significant supratentorial white matter disease is seen for age. No abnormal brain parenchymal or meningeal enhancement is identified. A small developmental venous anomaly is incidentally noted in the right parietal region. Dedicated imaging through the internal auditory canals demonstrates a normal course of cranial nerves VII and VIII without evidence of a mass or abnormal enhancement. The inner ear structures demonstrate normal signal and morphology bilaterally. No mass is present in the cerebellopontine angles. Vascular: Major intracranial vascular flow voids are preserved. Skull and upper cervical spine: Unremarkable bone marrow signal. Sinuses/Orbits: Bilateral cataract extraction. Paranasal sinuses and mastoid air cells are clear. Other: None. IMPRESSION: 1. Negative internal auditory canal imaging. 2. No acute intracranial abnormality. 3. Mild chronic small vessel ischemic disease. Electronically Signed   By: Logan Bores M.D.   On: 09/14/2020 15:48       Assessment & Plan:   Problem List Items Addressed This Visit    Ankle swelling    No significant swelling on exam today.  Worse as day progresses.  DP pulses palpable and equal bilaterally.  Compression hose.  Let elevation.  Decreased salt intake (monitor sodium intake).  Follow.       Asthma    Breathing stable.  Has albuterol to use prn.  Follow.       Hip pain, right    Persistent pain.  Check xray.  Further w/up pending results.  Relevant Orders   DG Hip Unilat W OR W/O Pelvis 2-3 Views Right (Completed)   Hypercholesterolemia    Low cholesterol diet and exercise.  Follow lipid panel.       Relevant Orders   Hepatic function panel   Lipid panel   Basic metabolic panel   Low back pain    Right low back/buttock pain and right groin pain.  Tylenol.  Check xray.  May need PT.  Follow.        Relevant Orders   DG Lumbar Spine 2-3 Views (Completed)   Stress    Increased stress as outlined.  Discussed.  Will notify me if feels needs any further intervention.  Follow.           Einar Pheasant, MD

## 2020-11-24 ENCOUNTER — Other Ambulatory Visit: Payer: Self-pay | Admitting: Internal Medicine

## 2020-11-24 DIAGNOSIS — M25551 Pain in right hip: Secondary | ICD-10-CM

## 2020-11-24 DIAGNOSIS — M5441 Lumbago with sciatica, right side: Secondary | ICD-10-CM

## 2020-11-24 NOTE — Progress Notes (Signed)
Order placed for physical therapy referral.

## 2020-11-26 ENCOUNTER — Encounter: Payer: Self-pay | Admitting: Internal Medicine

## 2020-11-26 DIAGNOSIS — M25473 Effusion, unspecified ankle: Secondary | ICD-10-CM | POA: Insufficient documentation

## 2020-11-26 NOTE — Assessment & Plan Note (Signed)
Breathing stable.  Has albuterol to use prn.  Follow.

## 2020-11-26 NOTE — Assessment & Plan Note (Signed)
Persistent pain.  Check xray.  Further w/up pending results.  

## 2020-11-26 NOTE — Assessment & Plan Note (Signed)
Increased stress as outlined.  Discussed.  Will notify me if feels needs any further intervention.  Follow.  

## 2020-11-26 NOTE — Assessment & Plan Note (Signed)
Low cholesterol diet and exercise.  Follow lipid panel.   

## 2020-11-26 NOTE — Assessment & Plan Note (Signed)
Right low back/buttock pain and right groin pain.  Tylenol.  Check xray.  May need PT.  Follow.

## 2020-11-26 NOTE — Assessment & Plan Note (Signed)
No significant swelling on exam today.  Worse as day progresses.  DP pulses palpable and equal bilaterally.  Compression hose.  Let elevation.  Decreased salt intake (monitor sodium intake).  Follow.

## 2020-12-01 DIAGNOSIS — E7800 Pure hypercholesterolemia, unspecified: Secondary | ICD-10-CM | POA: Diagnosis not present

## 2020-12-01 DIAGNOSIS — R0602 Shortness of breath: Secondary | ICD-10-CM | POA: Diagnosis not present

## 2020-12-01 DIAGNOSIS — I341 Nonrheumatic mitral (valve) prolapse: Secondary | ICD-10-CM | POA: Diagnosis not present

## 2020-12-01 DIAGNOSIS — F439 Reaction to severe stress, unspecified: Secondary | ICD-10-CM | POA: Diagnosis not present

## 2020-12-01 DIAGNOSIS — R079 Chest pain, unspecified: Secondary | ICD-10-CM | POA: Diagnosis not present

## 2020-12-18 NOTE — Patient Instructions (Incomplete)
Has persistent low back pain and pain in right groin/right hip.  Previous fall.  Patient here for a scheduled follow up. Here to follow up regarding increased stress.  Also reports increased pain - right groin and right buttock.  Husband passed unexpectedly 03/26/20.  She is still trying to cope with the stress from this.  Also trying to get all of the legal matters addressed.  Discussed.  She does not feel needs any further intervention.  Trying to stay active.  Increased pain - right buttock and right groin.  Hurts to get up and walk.  After she starts walking - feels some better.  Increased pain with stairs.  Some pain - posterior thigh.  Also reports some ankle swelling.  No leg swelling or redness.  No headache.  No chest pain.  Bowels moving.  Breathing stable.  Saw cardiology - ECHO - no significant abnormality.  Sleep study - no sleep apnea.  CXR and stress echo - unremarkable.  She is trying to adjust her diet.  Has not been eating well.     11/22/20: Lumbar: IMPRESSION: Unchanged moderate disc height loss at L4-L5 with degenerative grade 1 anterolisthesis at this level. Moderate lower lumbar predominant Facet arthritis.  R hip; FINDINGS: No pelvic fracture or diastasis. No right hip fracture or dislocation. No focal osseous lesions. No significant arthropathy. Degenerative changes in the visualized lower lumbar spine. No radiopaque foreign bodies.  IMPRESSION: No fracture or malalignment in the right hip.  SUBJECTIVE Chief complaint:   History: Referring Dx: Referring Provider: Pain location:  Pain: Present /10, Best /10, Worst /10: Pain quality: {pain quality:18634:::1} Radiating pain: {yes/no:20286}  Numbness/Tingling: {yes/no:20286} 24 hour pain behavior:  Aggravating factors: Easing factors: How long can you sit: How long can you stand: How long can you walk: History of back injury, pain, surgery, or therapy: {yes/no:20286} Follow-up appointment with MD:  {yes/no:20286} Dominant hand: {RIGHT/LEFT:20294} Imaging: {yes/no:20286}  Falls in the last 6 months: {yes/no:20286}  Occupational demands: Hobbies: Goals: Red flags (bowel/bladder changes, saddle paresthesia, personal history of cancer, chills/fever, night sweats, unrelenting pain, first onset of insidious LBP <20 y/o) Negative    OBJECTIVE  Mental Status Patient is oriented to person, place and time.  Recent memory is intact.  Remote memory is intact.  Attention span and concentration are intact.  Expressive speech is intact.  Patient's fund of knowledge is within normal limits for educational level.  SENSATION: Grossly intact to light touch bilateral LEs as determined by testing dermatomes L2-S2 Proprioception and hot/cold testing deferred on this date   MUSCULOSKELETAL: Tremor: None Bulk: Normal Tone: Normal No visible step-off along spinal column  Posture Lumbar lordosis: WNL Iliac crest height: equal bilaterally Lumbar lateral shift: negative Lower crossed syndrome (tight hip flexors and erector spinae; weak gluts and abs): negative  Gait **Wide based gait = spinal stenosis (+LR 12)   Palpation   Strength (out of 5) R/L 5/5 Hip flexion 5/5 Hip ER 5/5 Hip IR 5/5 Hip abduction 5/5 Hip adduction 5/5 Hip extension 5/5 Knee extension 5/5 Knee flexion 5/5 Ankle dorsiflexion 5/5 Ankle plantarflexion 5 Trunk flexion 5 Trunk extension 5/5 Trunk rotation  *Indicates pain   AROM (degrees) R/L (all movements include overpressure unless otherwise stated) Lumbar forward flexion (65):  Lumbar extension (30): Lumbar lateral flexion (25): R:  L:  Thoracic and Lumbar rotation (30 degrees):  R: degrees L: degrees Hip IR (0-45): R: L: Hip ER (0-45): R: L: Hip Flexion (0-125):  Hip Abduction (0-40): R: L:  Hip extension (0-15): R: L: *Indicates pain   PROM (degrees) PROM = AROM R/L (all movements include overpressure unless otherwise stated) Lumbar forward  flexion (65 degrees):  Lumbar extension (30 degrees): Lumbar lateral flexion (25 degrees): R: degrees L: degrees Thoracic and Lumbar rotation (30 degrees):  R: degrees L: degrees    Repeated Movements No centralization or peripheralization of symptoms with repeated lumbar extension or flexion.    Muscle Length Hamstrings: R: degrees L: degrees  Ely: Thomas:  Ober:    Passive Accessory Intervertebral Motion (PAIVM) Pt denies reproduction of back pain with CPA L1-L5 and UPA bilaterally L1-L5. Generally hypomobile throughout  Passive Physiological Intervertebral Motion (PPIVM) Normal flexion and extension with PPIVM testing   SPECIAL TESTS Lumbar Radiculopathy and Discogenic: Centralization and Peripheralization (SN 92, -LR 0.12): {NEGATIVE/POSITIVE UYQ:03474} Slump (SN 83, -LR 0.32): R: {NEGATIVE/POSITIVE QVZ:56387} L: {NEGATIVE/POSITIVE FOR:19998} SLR (SN 92, -LR 0.29): R: {NEGATIVE/POSITIVE FIE:33295} L:  {NEGATIVE/POSITIVE JOA:41660} Crossed SLR (SP 90): R: {NEGATIVE/POSITIVE YTK:16010} L: {NEGATIVE/POSITIVE XNA:35573}  Facet Joint: Extension-Rotation (SN 100, -LR 0.0): R: {NEGATIVE/POSITIVE UKG:25427} L: {NEGATIVE/POSITIVE CWC:37628}  Lumbar Spinal Stenosis: Lumbar quadrant (SN 70): R: {NEGATIVE/POSITIVE BTD:17616} L: {NEGATIVE/POSITIVE WVP:71062}  Hip: FABER (SN 81): R: {NEGATIVE/POSITIVE IRS:85462} L: {NEGATIVE/POSITIVE VOJ:50093} FADIR (SN 94): R: {NEGATIVE/POSITIVE FOR:19998} L: {NEGATIVE/POSITIVE GHW:29937} Hip scour (SN 50): R: {NEGATIVE/POSITIVE JIR:67893} L: {NEGATIVE/POSITIVE YBO:17510}  SIJ:  Thigh Thrust (SN 88, -LR 0.18) : R: {NEGATIVE/POSITIVE CHE:52778} L: {NEGATIVE/POSITIVE EUM:35361}  Piriformis Syndrome: FAIR Test (SN 88, SP 83): R: {NEGATIVE/POSITIVE WER:15400} L: {NEGATIVE/POSITIVE QQP:61950}  Functional Tasks Lifting:  Squatting:  Sit to stand:  Forward Step-Down Test: R: {NEGATIVE/POSITIVE DTO:67124} L: {NEGATIVE/POSITIVE PYK:99833} Lateral  Step-Down Test: R: {NEGATIVE/POSITIVE ASN:05397} L: {NEGATIVE/POSITIVE QBH:41937} Deep Squat: {NEGATIVE/POSITIVE TKW:40973}   ASSESSMENT Clinical Impression: Pt is a pleasant year-old female/female referred for low back pain. PT examination reveals deficits . Pt presents with deficits in strength, mobility, range of motion, and pain. Pt will benefit from skilled PT services to address deficits and return to pain-free function at home and work.    Pt will be independent with HEP in order to improve strength and decrease back pain in order to improve pain-free function at home and work.   Pt will decrease mODI score by at least 13 points in order demonstrate clinically significant reduction in back pain/disability.        Pt will decrease Roland-Morris Disability Questionnaire (RMDQ) by at least 5 points in order to demonstrate a clinically significant reduction in back pain/disability  Pt will decrease worst back pain as reported on NPRS by at least 2 points in order to demonstrate clinically significant reduction in back pain.   Pt will increase strength of by at least 1/2 MMT grade in order to demonstrate improvement in strength and function.   Clinical Prediction Rule for Compression Fractures: 1. >52 y/o 2. no presence of leg pain 3. BMI <22 4. client not regularly exercising 5. female <1 of 5 = SN 97, - LR 0.16 >4 of 5 = SP 96, + LR 9.6  Clinical Prediction Rule for Spinal Stenosis: 1. Bilateral symptoms 2. Leg pain > back pain 3. Pain during walking or standing 4. Pain relief when sitting 5. Age >68 y/o <1 (+) finding = rule out stenosis (SN 96) 4 of 5 (+) findings = rule in stenosis (SP 98)  Clinical Prediction Rule for Facet Joint Syndrome (check pg 460): 1. Age > 26 y/o 2. Symptoms best when walking 3. Symptoms best when sitting 4. Onset of pain  is paraspinal 5. (+) lumbar extension-rotation test >3 (+) findings = SP 91, +LR 9.7 <2 (+) findings = SN 100  Pain  Provocation Cluster for SIJ Dysfunction Thigh Thrust Test (SN 88, -LR 0.18) Gaenslen's Test Distraction Test Compression Test Sacral Thrust Test  3 (+) tests: if discogenic pain has been ruled out through repeated extensions; SN 94, -LR 0.80  Rule out Hip OA (SN 86) Hip pain Hip IR <15 degrees morning stiffness < 60 min > 64 y/o    Neurogenic vs Vascular Claudication Idamae Schuller, 2016)  Description Neurogenic Vascular  Quality of pain cramping Burning, cramping  LBP Frequently present Absent   Sensory symptoms Frequently present Absent  Muscle weakness Frequently present Absent  Reflex changes Frequently present Absent  Bicycle test Symptoms only when sitting upright Symptoms not position dependent  Arterial pulses Normal  Decreased or absent  Skin/dystrophic changes Absent  Frequently present  Aggravating factors Upright posture, extension of trunk Any leg activity  Relieving factors Sitting, bending forward Rest, no leg activity  Walking uphill Symptoms produced later Not position dependent  Walking downhill Symptoms produced earlier Not position dependent    Lumbar Spinal Stenosis vs Radiculopathy due to Disc Herniation (Rieman, 2016)  Description Spinal Stenosis Disc Herniation  Age Usually > 37 y/o Usually < 55 y/o  Onset Usually more insidious Usually more sudden  Position change: flexion Better  Worse  Position change: extension Worse Better  Focal muscle weakness Less common Can be common  Dural tension Less common Common  UMN or LMN involvement  Central stenosis: UMN Lateral foraminal stenosis: LMN LMN

## 2020-12-19 ENCOUNTER — Other Ambulatory Visit: Payer: Self-pay

## 2020-12-19 ENCOUNTER — Ambulatory Visit: Payer: PPO | Attending: Internal Medicine

## 2020-12-19 DIAGNOSIS — M25551 Pain in right hip: Secondary | ICD-10-CM | POA: Insufficient documentation

## 2020-12-19 DIAGNOSIS — M545 Low back pain, unspecified: Secondary | ICD-10-CM | POA: Insufficient documentation

## 2020-12-19 NOTE — Therapy (Addendum)
[facility] [address] [place], [place], [address] Phone: [phone]   Fax:  [phone]  Physical Therapy Evaluation  Patient Details  Name: CHANEKA TREFZ MRN: [id number] Date of Birth: 1950/08/18 Referring Provider (PT): Einar Pheasant   Encounter Date: 12/19/2020   PT End of Session - 12/19/20 1520    Visit Number 1    Number of Visits 17    Date for PT Re-Evaluation 02/13/21    Authorization Type eval: 12/19/20    PT Start Time 1420    PT Stop Time 1500    PT Time Calculation (min) 40 min    Activity Tolerance Patient tolerated treatment well    Behavior During Therapy [facility] for tasks assessed/performed           Past Medical History:  Diagnosis Date  . Arthritis   . C. difficile diarrhea    age 68s-40s   . Chicken pox   . Cholecystitis 11/2011   Did not require sgy - Dr. Staci Acosta - [facility]  (cholelithiasis)  . H/O Clostridium difficile infection   . IBS (irritable bowel syndrome)   . MRSA exposure 2005   Spider bite  . MVP (mitral valve prolapse)    Stable - Dr. Ubaldo Glassing  . Rheumatic fever     Past Surgical History:  Procedure Laterality Date  . CATARACT EXTRACTION  [id number]  . MUSCLE BIOPSY      There were no vitals filed for this visit.    Subjective Assessment - 12/19/20 1456    Subjective R hip pain    Pertinent History Pt reports R posterior and anterior (groin) hip pain which started 6 weeks ago. Pain started suddenly and then gradually increased but has remained constant since then. She denies any radicular symptoms. Occasionally she has BLE weakness as well which she associates with the hip pain. After the pain started she did have a fall off her mattress onto the floor. She denies injury or any any increase in her pain. Denies any prior history of hip/back pain or surgeries. Her legs occasionally feel heavy but she denies any numbness or tingling in BLE. Lumbar radiographs showed unchanged moderate disc  height loss at L4-L5 with degenerative grade 1 anterolisthesis at this level. Moderate lower lumbar predominant facet arthritis. R hip flims showed no significant arthropathy, fracture, or malalignment.  She reports a history of scoliosis and osteopenia and has previously participated in physical therapy with pelvic health therapist for urinary incontinence as well as vestibular therapy for dizziness/imbalance. Patient's husband passed unexpectedly 03/26/20 and she is still trying to cope with the stress from this.  Also trying to get all of the legal matters addressed. Recently saw cardiology and echo shows no significant abnormalities. CXR and stress echo unremarkable. Sleep study negative for sleep apnea.    Diagnostic tests See history    Patient Stated Goals Pain to stop, strengthen legs, feel free to walk and have energy    Currently in Pain? Yes    Pain Score 0-No pain   Worst: 10/10, Best: 0/10   Pain Location Hip    Pain Orientation Right    Pain Descriptors / Indicators Sharp    Pain Type Acute pain    Pain Radiating Towards None    Pain Onset More than a month ago    Pain Frequency Intermittent    Aggravating Factors  see history    Pain Relieving Factors see history  [facility] PT Assessment - 12/19/20 1519      Assessment   Medical Diagnosis Midline low back pain with RLE sciatica    Referring Provider (PT) Einar Pheasant    Onset Date/Surgical Date 11/07/20    Hand Dominance Right    Next MD Visit Not reported    Prior Therapy Yes for urinary incontinence and dizziness      Precautions   Precautions None      Restrictions   Weight Bearing Restrictions No            SUBJECTIVE Chief complaint:   History: Pt reports R posterior and anterior (groin) hip pain which started 6 weeks ago. Pain started suddenly and then gradually increased but has remained constant since then. She denies any radicular symptoms. Occasionally she has BLE weakness as well which she  associates with the hip pain. After the pain started she did have a fall off her mattress onto the floor. She denies injury or any any increase in her pain. Denies any prior history of hip/back pain or surgeries. Her legs occasionally feel heavy but she denies any numbness or tingling in BLE. Lumbar radiographs showed unchanged moderate disc height loss at L4-L5 with degenerative grade 1 anterolisthesis at this level. Moderate lower lumbar predominant facet arthritis. R hip flims showed no significant arthropathy, fracture, or malalignment.  She reports a history of scoliosis and osteopenia and has previously participated in physical therapy with pelvic health therapist for urinary incontinence as well as vestibular therapy for dizziness/imbalance. Patient's husband passed unexpectedly 03/26/20 and she is still trying to cope with the stress from this.  Also trying to get all of the legal matters addressed. Recently saw cardiology and echo shows no significant abnormalities. CXR and stress echo unremarkable. Sleep study negative for sleep apnea.  Pain location: R posterior and anterior (groin) hip; Pain: Present 0/10, Best 0/10, Worst 10/10 Pain quality: blunt/sharp in anterior R groin, "black heat" Radiating pain: No  Numbness/Tingling: No 24 hour pain behavior: Variable Aggravating factors: Sitting in chairs in her den (rolling desk chair and wooden chair with arms), sit to stand, bending forward,  Easing factors: Improves typically with extended walking, aspirin How long can you sit: No limitation How long can you stand: No limitation due to hip pain How long can you walk: No limitation due to hip pain  History of back injury, pain, surgery, or therapy: No Dominant hand: right Imaging: Yes, see history  Falls in the last 6 months: Yes, once off mattress onto floor, no injury; Occupational demands: Retired Administrator, Civil Service: walking, church,   Goals: Pain to stop, strengthen legs, feel free to  walk and have energy Red flags (bowel/bladder changes, saddle paresthesia, personal history of cancer, chills/fever, night sweats, unrelenting pain, first onset of insidious LBP <20 y/o) Negative    OBJECTIVE  Mental Status Patient is oriented to person, place and time.  Recent memory is intact.  No formal cognitive assessment. Attention span and concentration are intact.  Expressive speech is intact.  Patient's fund of knowledge is within normal limits for educational level.  SENSATION: Deferred   MUSCULOSKELETAL: Tremor: None Bulk: Normal Tone: Normal No visible step-off along spinal column  Posture Lumbar lordosis: WNL Iliac crest height: equal bilaterally R shoulder is lower than left. Pt reports that LLE is shorter than the right but not measured today. She reports history of scoliosis but unable to report which direction for the curve and which segment of the spine Lumbar lateral shift:  negative  Gait Deferred   Palpation Pt is generally tender to palpation around lower R lumbar paraspinals, over R SIJ, R sciatic notch and moving out laterally toward posterior aspect of R greater trochanter   Strength (out of 5) R/L 4*/4+ Hip flexion 4+/4+ Hip ER 4*/4+ Hip IR 4-/4- Hip abduction 4-/4- Hip adduction 4-/4- Hip extension 5/5 Knee extension 4/4 Knee flexion 5/5 Ankle dorsiflexion Active bilateral ankle plantarflexion *Indicates pain   AROM (degrees) R/L (all movements include overpressure unless otherwise stated) Lumbar forward flexion (65): 25% loss* Lumbar extension (30): WNL* (pain when returning back to neutral) Lumbar lateral flexion (25): R: WNL* L: WNL Thoracic and Lumbar rotation (30 degrees):  R: 25% loss, L: 25 loss;   Hip IR (0-45): WNL bilaterally Hip ER (0-45): WNL bilaterally Hip Flexion (0-125):  WNL *Indicates pain   Repeated Movements No centralization or peripheralization of symptoms with repeated lumbar extension or flexion.     Muscle Length Hamstrings: R: 90 degrees L: 90 degrees  [place]: WNL bilaterally;   Passive Accessory Intervertebral Motion (PAIVM) Pt reports mild pain with central and R unilateral passive accessory mobility testing of lower lumbar segments L3-L5. L1-2 CPA negative and L1-L5 L UPA negative. Generally hypomobile throughout    SPECIAL TESTS Lumbar Radiculopathy and Discogenic: Centralization and Peripheralization (SN 92, -LR 0.12): Negative Slump (SN 83, -LR 0.32): R: Negative L: Negative SLR (SN 92, -LR 0.29): R: Negative L:  Negative Crossed SLR (SP 90): R: Negative L: Negative  Facet Joint: Extension-Rotation (SN 100, -LR 0.0): R: Negative L: Negative  Lumbar Spinal Stenosis: Lumbar quadrant (SN 70): R: Negative L: Negative  Hip: FABER (SN 81): R: Positive L: Negative FADIR (SN 94): R: Positive L: Negative Hip scour (SN 50): R: Positive L: Negative  SIJ:  Thigh Thrust (SN 88, -LR 0.18) : R: Not examined L: Not examined  Piriformis Syndrome: FAIR Test (SN 88, SP 83): R: Not examined L: Not examined          Objective measurements completed on examination: See above findings.                 PT Short Term Goals - 12/19/20 1523      PT SHORT TERM GOAL #1   Title Pt will be independent with HEP in order to improve strength and decrease back/hip pain in order to improve pain-free function at home and work.    Time 4    Period Weeks    Status New    Target Date 01/16/21             PT Long Term Goals - 12/19/20 1523      PT LONG TERM GOAL #1   Title Pt will decrease worst back/hip pain as reported on NPRS by at least 3 points in order to demonstrate clinically significant reduction in back pain.    Baseline 12/19/20: worst: 10/10    Time 8    Period Weeks    Status New    Target Date 02/13/21      PT LONG TERM GOAL #2   Title Pt will decrease mODI score by at least 13 points in order demonstrate clinically significant reduction in back  pain/disability.    Baseline 12/19/20: 24%    Time 8    Period Weeks    Status New    Target Date 02/13/21      PT LONG TERM GOAL #3   Title Pt will improve FOTO to at least 58  in order to demonstrate signficant imrpovement in function related to her her low back/hip pain    Baseline 12/19/20: 34    Time 8    Period Weeks    Status New    Target Date 02/13/21      PT LONG TERM GOAL #4   Title Pt will increase strength of bilateral hip abduction and extension by at least 1/2 MMT grade in order to demonstrate improvement in strength and function.    Baseline 12/19/20: 4- bilaterally for both abduction and extension    Time 8    Period Weeks    Status New    Target Date 02/13/21                  Plan - 12/19/20 1521    Clinical Impression Statement Pt is a pleasant 70 year-old female referred for low back pain/R hip pain. PT examination reveals tenderness to palpation around lower R lumbar paraspinals, over R SIJ, R sciatic notch and moving out laterally toward posterior aspect of R greater trochanter. Pain with active lumbar flexion, extension, and R rotation. She reports mild pain with central and R unilateral passive accessory mobility testing of lower lumbar segments. Positive R hip FABER and FADIR as well as R hip scour. Weakness noted in bilateral hip abduction, adduction, and extension. Pt presents with deficits in strength, range of motion, and pain and will benefit from skilled PT services to address deficits and return to pain-free function at home.    Personal Factors and Comorbidities Age;Comorbidity 2    Comorbidities OA, IBS    Examination-Activity Limitations Transfers;Stairs    Examination-Participation Restrictions Church;Community Activity;Meal Prep    Stability/Clinical Decision Making Stable/Uncomplicated    Clinical Decision Making Low    Rehab Potential Good    PT Frequency 2x / week    PT Duration 8 weeks    PT Treatment/Interventions ADLs/Self Care Home  Management;Aquatic Therapy;Biofeedback;Canalith Repostioning;Cryotherapy;Electrical Stimulation;Iontophoresis 4mg /ml Dexamethasone;Moist Heat;Traction;Ultrasound;Functional mobility training;Therapeutic activities;Therapeutic exercise;Balance training;Neuromuscular re-education;Manual techniques;Passive range of motion;Dry needling;Vestibular;Visual/perceptual remediation/compensation;Spinal Manipulations;Joint Manipulations    PT Next Visit Plan Initiate HEP, gait assessment, initiate LE strengthening and manual techniques    PT Home Exercise Plan None currently    Consulted and Agree with Plan of Care Patient           Patient will benefit from skilled therapeutic intervention in order to improve the following deficits and impairments:  Pain,Decreased strength  Visit Diagnosis: Pain in right hip - Plan: PT plan of care cert/re-cert  Acute right-sided low back pain, unspecified whether sciatica present - Plan: PT plan of care cert/re-cert     Problem List Patient Active Problem List   Diagnosis Date Noted  . Ankle swelling 11/26/2020  . Low back pain 11/22/2020  . Hip pain, right 11/22/2020  . Light headedness 06/24/2020  . Hearing loss 06/24/2020  . Change in vision 06/24/2020  . Stress 05/17/2020  . Chest pain 05/16/2020  . Welcome to Medicare preventive visit 06/20/2019  . Viral syndrome 01/30/2019  . Itching 07/21/2017  . Asthma 07/16/2016  . Urinary incontinence 07/16/2016  . SOB (shortness of breath) 04/01/2016  . Bilateral shoulder pain 02/24/2016  . Fatigue 01/28/2016  . Neck fullness 01/28/2016  . Routine general medical examination at a health care facility 07/25/2015  . Health care maintenance 10/09/2014  . Neck pain 11/26/2013  . Hypercholesterolemia 11/26/2013  . History of colonic polyps 05/11/2013  . Hyperbilirubinemia 01/18/2013  . GERD (gastroesophageal reflux disease) 12/03/2012  .  Cholelithiasis 11/07/2012  . IBS (irritable bowel syndrome) 11/07/2012   . History of rheumatic fever 08/28/2012  . MVP (mitral valve prolapse) 08/28/2012   Phillips Grout PT, DPT, GCS  Gabe Glace 12/20/2020, 5:34 PM  [facility] [address] [place], [place], [address] Phone: [phone]   Fax:  [phone]  Name: KRISTEE ANGUS MRN: [id number] Date of Birth: 09/03/1950

## 2020-12-20 NOTE — Patient Instructions (Addendum)
Access Code: [email or web address] URL: [email or web address] Date: 12/21/2020 Prepared by: Roxana Hires  Exercises Supine Pelvic Tilt - 1 x daily - 7 x weekly - 2 sets - 10 reps - 5s hold Supine Pelvic Lateral Tilting - 1 x daily - 7 x weekly - 2 sets - 10 reps - 5s hold Supine March - 1 x daily - 7 x weekly - 2 sets - 10 reps - 5s hold Supine Piriformis Stretch with Foot on Ground - 1 x daily - 7 x weekly - 3 reps - 45s hold

## 2020-12-20 NOTE — Addendum Note (Signed)
Addended by: Roxana Hires D on: 12/20/2020 05:34 PM   Modules accepted: Orders

## 2020-12-21 ENCOUNTER — Other Ambulatory Visit: Payer: Self-pay

## 2020-12-21 ENCOUNTER — Ambulatory Visit: Payer: PPO

## 2020-12-21 DIAGNOSIS — M545 Low back pain, unspecified: Secondary | ICD-10-CM

## 2020-12-21 DIAGNOSIS — M25551 Pain in right hip: Secondary | ICD-10-CM | POA: Diagnosis not present

## 2020-12-21 NOTE — Therapy (Signed)
[facility] [address] [place], [place], [address] Phone: [phone]   Fax:  [phone]  Physical Therapy Treatment  Patient Details  Name: Lynn Mann MRN: [id number] Date of Birth: [birth year] Referring Provider (PT): Einar Pheasant   Encounter Date: 12/21/2020   PT End of Session - 12/21/20 1459    Visit Number 2    Number of Visits 17    Date for PT Re-Evaluation 02/13/21    Authorization Type eval: 12/19/20    PT Start Time 9292    PT Stop Time 1445    PT Time Calculation (min) 42 min    Activity Tolerance Patient tolerated treatment well    Behavior During Therapy [facility] for tasks assessed/performed           Past Medical History:  Diagnosis Date  . Arthritis   . C. difficile diarrhea    age 61s-40s   . Chicken pox   . Cholecystitis 11/2011   Did not require sgy - Dr. Staci Acosta - [facility]  (cholelithiasis)  . H/O Clostridium difficile infection   . IBS (irritable bowel syndrome)   . MRSA exposure 2005   Spider bite  . MVP (mitral valve prolapse)    Stable - Dr. Ubaldo Glassing  . Rheumatic fever     Past Surgical History:  Procedure Laterality Date  . CATARACT EXTRACTION  [id number]  . MUSCLE BIOPSY      There were no vitals filed for this visit.   Subjective Assessment - 12/21/20 1405    Subjective Pt reports that she is doing alright today. She denies any changes since the initial evaluation. She is complaining of 1/10 R hip pain upon arrival today.    Pertinent History Pt reports R posterior and anterior (groin) hip pain which started 6 weeks ago. Pain started suddenly and then gradually increased but has remained constant since then. She denies any radicular symptoms. Occasionally she has BLE weakness as well which she associates with the hip pain. After the pain started she did have a fall off her mattress onto the floor. She denies injury or any any increase in her pain. Denies any prior history of hip/back pain or  surgeries. Her legs occasionally feel heavy but she denies any numbness or tingling in BLE. Lumbar radiographs showed unchanged moderate disc height loss at L4-L5 with degenerative grade 1 anterolisthesis at this level. Moderate lower lumbar predominant facet arthritis. R hip flims showed no significant arthropathy, fracture, or malalignment.  She reports a history of scoliosis and osteopenia and has previously participated in physical therapy with pelvic health therapist for urinary incontinence as well as vestibular therapy for dizziness/imbalance. Patient's husband passed unexpectedly 03/26/20 and she is still trying to cope with the stress from this.  Also trying to get all of the legal matters addressed. Recently saw cardiology and echo shows no significant abnormalities. CXR and stress echo unremarkable. Sleep study negative for sleep apnea.    Diagnostic tests See history    Patient Stated Goals Pain to stop, strengthen legs, feel free to walk and have energy    Currently in Pain? Yes    Pain Score 1     Pain Location Hip    Pain Orientation Right    Pain Descriptors / Indicators Sharp    Pain Type Acute pain    Pain Onset More than a month ago    Pain Frequency Intermittent  TREATMENT   Ther-ex  NuStep L2 x 5 minutes for warm-up during history, therapist monitoring fatigue; Prone alternating straight leg hip extension x 10 BLE; Hooklying anterior/posterior pelvic tilts 5s hold x 10 each; Hooklying lateral pelvic tilts 5s hold x 10 to each side; Hooklying marching with transverse abdominis contraction 2 x 10 BLE;   Manual Therapy  R hip single knee to chest stretch x 45s; R hip FADIR stretch x 45s, varied level of flexion to minimize groin pain; R hip FABER stretch x 45s; L sidelying R hip STM with Theraband roller; Prone CPA L2-L5, grade I-II, 30s/bout x 2 bouts/level; Prone R UPA L2-L5, grade I-II, 30s/bout x 1 bout/level; Prone STM to R paraspinals;   Pt  educated throughout session about proper posture and technique with exercises. Improved exercise technique, movement at target joints, use of target muscles after min to mod verbal, visual, tactile cues.    Pt demonstrates excellent motivation during session today. Initiated R hip and low back stretches/manual techniques. Also initiated strengthening for low back/abdominal muscles which pt is able to perform with only occasional mild pain in low back/posterior right hip. Issued HEP including ant/post/lateral pelvic tilts, R piriformis stretch, and marches with transverse abdominis contraction. Pt encouraged to follow-up as scheduled. She will benefit from PT services to address deficits in R low back/hip pain in order to return to full function at home with less pain.                    PT Education - 12/21/20 1512    Education Details HEP    Person(s) Educated Patient    Methods Explanation;Handout    Comprehension Verbalized understanding            PT Short Term Goals - 12/19/20 1523      PT SHORT TERM GOAL #1   Title Pt will be independent with HEP in order to improve strength and decrease back/hip pain in order to improve pain-free function at home and work.    Time 4    Period Weeks    Status New    Target Date 01/16/21             PT Long Term Goals - 12/19/20 1523      PT LONG TERM GOAL #1   Title Pt will decrease worst back/hip pain as reported on NPRS by at least 3 points in order to demonstrate clinically significant reduction in back pain.    Baseline 12/19/20: worst: 10/10    Time 8    Period Weeks    Status New    Target Date 02/13/21      PT LONG TERM GOAL #2   Title Pt will decrease mODI score by at least 13 points in order demonstrate clinically significant reduction in back pain/disability.    Baseline 12/19/20: 24%    Time 8    Period Weeks    Status New    Target Date 02/13/21      PT LONG TERM GOAL #3   Title Pt will improve FOTO to  at least 58 in order to demonstrate signficant imrpovement in function related to her her low back/hip pain    Baseline 12/19/20: 34    Time 8    Period Weeks    Status New    Target Date 02/13/21      PT LONG TERM GOAL #4   Title Pt will increase strength of bilateral hip abduction and extension by at  least 1/2 MMT grade in order to demonstrate improvement in strength and function.    Baseline 12/19/20: 4- bilaterally for both abduction and extension    Time 8    Period Weeks    Status New    Target Date 02/13/21                 Plan - 12/21/20 1459    Clinical Impression Statement Pt demonstrates excellent motivation during session today. Initiated R hip and low back stretches/manual techniques. Also initiated strengthening for low back/abdominal muscles which pt is able to perform with only occasional mild pain in low back/posterior right hip. Issued HEP including ant/post/lateral pelvic tilts, R piriformis stretch, and marches with transverse abdominis contraction. Pt encouraged to follow-up as scheduled. She will benefit from PT services to address deficits in R low back/hip pain in order to return to full function at home with less pain.    Personal Factors and Comorbidities Age;Comorbidity 2    Comorbidities OA, IBS    Examination-Activity Limitations Transfers;Stairs    Examination-Participation Restrictions Church;Community Activity;Meal Prep    Stability/Clinical Decision Making Stable/Uncomplicated    Rehab Potential Good    PT Frequency 2x / week    PT Duration 8 weeks    PT Treatment/Interventions ADLs/Self Care Home Management;Aquatic Therapy;Biofeedback;Canalith Repostioning;Cryotherapy;Electrical Stimulation;Iontophoresis 4mg /ml Dexamethasone;Moist Heat;Traction;Ultrasound;Functional mobility training;Therapeutic activities;Therapeutic exercise;Balance training;Neuromuscular re-education;Manual techniques;Passive range of motion;Dry  needling;Vestibular;Visual/perceptual remediation/compensation;Spinal Manipulations;Joint Manipulations    PT Next Visit Plan Initiate HEP, gait assessment, initiate LE strengthening and manual techniques    PT Home Exercise Plan Access Code: [email or web address]    Consulted and Agree with Plan of Care Patient           Patient will benefit from skilled therapeutic intervention in order to improve the following deficits and impairments:  Pain,Decreased strength  Visit Diagnosis: Pain in right hip  Acute right-sided low back pain, unspecified whether sciatica present     Problem List Patient Active Problem List   Diagnosis Date Noted  . Ankle swelling 11/26/2020  . Low back pain 11/22/2020  . Hip pain, right 11/22/2020  . Light headedness 06/24/2020  . Hearing loss 06/24/2020  . Change in vision 06/24/2020  . Stress 05/17/2020  . Chest pain 05/16/2020  . Welcome to Medicare preventive visit 06/20/2019  . Viral syndrome 01/30/2019  . Itching 07/21/2017  . Asthma 07/16/2016  . Urinary incontinence 07/16/2016  . SOB (shortness of breath) 04/01/2016  . Bilateral shoulder pain 02/24/2016  . Fatigue 01/28/2016  . Neck fullness 01/28/2016  . Routine general medical examination at a health care facility 07/25/2015  . Health care maintenance 10/09/2014  . Neck pain 11/26/2013  . Hypercholesterolemia 11/26/2013  . History of colonic polyps 05/11/2013  . Hyperbilirubinemia 01/18/2013  . GERD (gastroesophageal reflux disease) 12/03/2012  . Cholelithiasis 11/07/2012  . IBS (irritable bowel syndrome) 11/07/2012  . History of rheumatic fever 08/28/2012  . MVP (mitral valve prolapse) 08/28/2012   Phillips Grout PT, DPT, GCS  Armour Villanueva 12/21/2020, 3:14 PM  [facility] [address]. [place], [place], [address] Phone: [phone]   Fax:  [phone]  Name: Lynn Mann MRN: [id number] Date of Birth: 08/28/1950

## 2020-12-26 DIAGNOSIS — R0602 Shortness of breath: Secondary | ICD-10-CM | POA: Diagnosis not present

## 2020-12-28 ENCOUNTER — Other Ambulatory Visit: Payer: Self-pay

## 2020-12-28 ENCOUNTER — Ambulatory Visit: Payer: PPO | Attending: Internal Medicine

## 2020-12-28 DIAGNOSIS — M25551 Pain in right hip: Secondary | ICD-10-CM | POA: Insufficient documentation

## 2020-12-28 DIAGNOSIS — M545 Low back pain, unspecified: Secondary | ICD-10-CM | POA: Diagnosis not present

## 2020-12-28 NOTE — Therapy (Signed)
[facility] [address] [place], [place], [address] Phone: [phone]   Fax:  [phone]  Physical Therapy Treatment  Patient Details  Name: Lynn Mann MRN: [id number] Date of Birth: 29-Mar-1951 Referring Provider (PT): Einar Pheasant   Encounter Date: 12/28/2020   PT End of Session - 12/28/20 1155    Visit Number 3    Number of Visits 17    Date for PT Re-Evaluation 02/13/21    Authorization Type eval: 12/19/20    PT Start Time 1152    PT Stop Time 1230    PT Time Calculation (min) 38 min    Activity Tolerance Patient tolerated treatment well    Behavior During Therapy [facility] for tasks assessed/performed           Past Medical History:  Diagnosis Date  . Arthritis   . C. difficile diarrhea    age 46s-40s   . Chicken pox   . Cholecystitis 11/2011   Did not require sgy - Dr. Staci Acosta - [facility]  (cholelithiasis)  . H/O Clostridium difficile infection   . IBS (irritable bowel syndrome)   . MRSA exposure 2005   Spider bite  . MVP (mitral valve prolapse)    Stable - Dr. Ubaldo Glassing  . Rheumatic fever     Past Surgical History:  Procedure Laterality Date  . CATARACT EXTRACTION  [id number]  . MUSCLE BIOPSY      There were no vitals filed for this visit.   Subjective Assessment - 12/28/20 1153    Subjective Pt reports that she is doing alright today. She denies any changes since last therapy session. No pain in R hip currently.    Pertinent History Pt reports R posterior and anterior (groin) hip pain which started 6 weeks ago. Pain started suddenly and then gradually increased but has remained constant since then. She denies any radicular symptoms. Occasionally she has BLE weakness as well which she associates with the hip pain. After the pain started she did have a fall off her mattress onto the floor. She denies injury or any any increase in her pain. Denies any prior history of hip/back pain or surgeries. Her legs occasionally  feel heavy but she denies any numbness or tingling in BLE. Lumbar radiographs showed unchanged moderate disc height loss at L4-L5 with degenerative grade 1 anterolisthesis at this level. Moderate lower lumbar predominant facet arthritis. R hip flims showed no significant arthropathy, fracture, or malalignment.  She reports a history of scoliosis and osteopenia and has previously participated in physical therapy with pelvic health therapist for urinary incontinence as well as vestibular therapy for dizziness/imbalance. Patient's husband passed unexpectedly 03/26/20 and she is still trying to cope with the stress from this.  Also trying to get all of the legal matters addressed. Recently saw cardiology and echo shows no significant abnormalities. CXR and stress echo unremarkable. Sleep study negative for sleep apnea.    Diagnostic tests See history    Patient Stated Goals Pain to stop, strengthen legs, feel free to walk and have energy    Currently in Pain? No/denies              TREATMENT   Ther-ex  NuStep L2 x 5 minutes for warm-up during history, therapist monitoring fatigue; Hooklying anterior/posterior pelvic tilts 5s hold x 10 each; Hooklying lateral pelvic tilts 5s hold x 10 to each side; Hooklying marching with posterior pelvic tilt x 10BLE; Hooklying SLR with posterior pelvic tilt x 10BLE;  Hooklying bridge with posterior pelvic tilt x 10 BLE;    Manual Therapy  R hip single knee to chest stretch x 45s; R hip FADIR stretch x 45s, varied level of flexion to minimize groin pain; R hip FABER stretch x 45s; L sidelying R hip and low back STM with Theraband roller;   Pt educated throughout session about proper posture and technique with exercises. Improved exercise technique, movement at target joints, use of target muscles after min to mod verbal, visual, tactile cues.    Pt demonstrates excellent motivation during session today.  Continued with R hip and low back  stretches/manual techniques. Also continued with strengthening for low back/abdominal muscles which pt is able to perform with no reported right hip or low back pain today.  No HEP modifications provided on this date.  Pt encouraged to follow-up as scheduled. She will benefit from PT services to address deficits in R low back/hip pain in order to return to full function at home with less pain.                           PT Short Term Goals - 12/19/20 1523      PT SHORT TERM GOAL #1   Title Pt will be independent with HEP in order to improve strength and decrease back/hip pain in order to improve pain-free function at home and work.    Time 4    Period Weeks    Status New    Target Date 01/16/21             PT Long Term Goals - 12/19/20 1523      PT LONG TERM GOAL #1   Title Pt will decrease worst back/hip pain as reported on NPRS by at least 3 points in order to demonstrate clinically significant reduction in back pain.    Baseline 12/19/20: worst: 10/10    Time 8    Period Weeks    Status New    Target Date 02/13/21      PT LONG TERM GOAL #2   Title Pt will decrease mODI score by at least 13 points in order demonstrate clinically significant reduction in back pain/disability.    Baseline 12/19/20: 24%    Time 8    Period Weeks    Status New    Target Date 02/13/21      PT LONG TERM GOAL #3   Title Pt will improve FOTO to at least 58 in order to demonstrate signficant imrpovement in function related to her her low back/hip pain    Baseline 12/19/20: 34    Time 8    Period Weeks    Status New    Target Date 02/13/21      PT LONG TERM GOAL #4   Title Pt will increase strength of bilateral hip abduction and extension by at least 1/2 MMT grade in order to demonstrate improvement in strength and function.    Baseline 12/19/20: 4- bilaterally for both abduction and extension    Time 8    Period Weeks    Status New    Target Date 02/13/21                  Plan - 12/28/20 1155    Clinical Impression Statement Pt demonstrates excellent motivation during session today.  Continued with R hip and low back stretches/manual techniques. Also continued with strengthening for low back/abdominal muscles which pt is able to  perform with no reported right hip or low back pain today.  No HEP modifications provided on this date.  Pt encouraged to follow-up as scheduled. She will benefit from PT services to address deficits in R low back/hip pain in order to return to full function at home with less pain.    Personal Factors and Comorbidities Age;Comorbidity 2    Comorbidities OA, IBS    Examination-Activity Limitations Transfers;Stairs    Examination-Participation Restrictions Church;Community Activity;Meal Prep    Stability/Clinical Decision Making Stable/Uncomplicated    Rehab Potential Good    PT Frequency 2x / week    PT Duration 8 weeks    PT Treatment/Interventions ADLs/Self Care Home Management;Aquatic Therapy;Biofeedback;Canalith Repostioning;Cryotherapy;Electrical Stimulation;Iontophoresis 4mg /ml Dexamethasone;Moist Heat;Traction;Ultrasound;Functional mobility training;Therapeutic activities;Therapeutic exercise;Balance training;Neuromuscular re-education;Manual techniques;Passive range of motion;Dry needling;Vestibular;Visual/perceptual remediation/compensation;Spinal Manipulations;Joint Manipulations    PT Next Visit Plan LE strengthening and manual techniques    PT Home Exercise Plan Access Code: [email or web address]    Consulted and Agree with Plan of Care Patient           Patient will benefit from skilled therapeutic intervention in order to improve the following deficits and impairments:  Pain,Decreased strength  Visit Diagnosis: Pain in right hip     Problem List Patient Active Problem List   Diagnosis Date Noted  . Ankle swelling 11/26/2020  . Low back pain 11/22/2020  . Hip pain, right 11/22/2020  . Light headedness  06/24/2020  . Hearing loss 06/24/2020  . Change in vision 06/24/2020  . Stress 05/17/2020  . Chest pain 05/16/2020  . Welcome to Medicare preventive visit 06/20/2019  . Viral syndrome 01/30/2019  . Itching 07/21/2017  . Asthma 07/16/2016  . Urinary incontinence 07/16/2016  . SOB (shortness of breath) 04/01/2016  . Bilateral shoulder pain 02/24/2016  . Fatigue 01/28/2016  . Neck fullness 01/28/2016  . Routine general medical examination at a health care facility 07/25/2015  . Health care maintenance 10/09/2014  . Neck pain 11/26/2013  . Hypercholesterolemia 11/26/2013  . History of colonic polyps 05/11/2013  . Hyperbilirubinemia 01/18/2013  . GERD (gastroesophageal reflux disease) 12/03/2012  . Cholelithiasis 11/07/2012  . IBS (irritable bowel syndrome) 11/07/2012  . History of rheumatic fever 08/28/2012  . MVP (mitral valve prolapse) 08/28/2012   Phillips Grout PT, DPT, GCS  Ayrianna Mcginniss 12/28/2020, 4:35 PM  [facility] [address] [place], [place], [address] Phone: [phone]   Fax:  [phone]  Name: MYCHAELA LENNARTZ MRN: [id number] Date of Birth: [birth year]

## 2021-01-02 ENCOUNTER — Other Ambulatory Visit: Payer: Self-pay

## 2021-01-02 ENCOUNTER — Ambulatory Visit: Payer: PPO

## 2021-01-02 DIAGNOSIS — M25551 Pain in right hip: Secondary | ICD-10-CM | POA: Diagnosis not present

## 2021-01-02 NOTE — Therapy (Signed)
[facility] [address] [place], [place], [address] Phone: [phone]   Fax:  [phone]  Physical Therapy Treatment  Patient Details  Name: Lynn Mann MRN: [id number] Date of Birth: [birth year] Referring Provider (PT): Einar Pheasant   Encounter Date: 01/02/2021   PT End of Session - 01/02/21 1601    Visit Number 4    Number of Visits 17    Date for PT Re-Evaluation 02/13/21    Authorization Type eval: 12/19/20    PT Start Time 1400    PT Stop Time 1445    PT Time Calculation (min) 45 min    Activity Tolerance Patient tolerated treatment well    Behavior During Therapy [facility] for tasks assessed/performed           Past Medical History:  Diagnosis Date  . Arthritis   . C. difficile diarrhea    age 59s-40s   . Chicken pox   . Cholecystitis 11/2011   Did not require sgy - Dr. Staci Acosta - [facility]  (cholelithiasis)  . H/O Clostridium difficile infection   . IBS (irritable bowel syndrome)   . MRSA exposure 2005   Spider bite  . MVP (mitral valve prolapse)    Stable - Dr. Ubaldo Glassing  . Rheumatic fever     Past Surgical History:  Procedure Laterality Date  . CATARACT EXTRACTION  [id number]  . MUSCLE BIOPSY      There were no vitals filed for this visit.   Subjective Assessment - 01/02/21 1426    Subjective Pt reports that she is doing alright today. She denies any changes since last therapy session. She had a brief flare in R hip pain a couple days ago but it resolved. No pain in R hip currently. She did have some pain her L hip with piriformis stretches.    Pertinent History Pt reports R posterior and anterior (groin) hip pain which started 6 weeks ago. Pain started suddenly and then gradually increased but has remained constant since then. She denies any radicular symptoms. Occasionally she has BLE weakness as well which she associates with the hip pain. After the pain started she did have a fall off her mattress onto the floor.  She denies injury or any any increase in her pain. Denies any prior history of hip/back pain or surgeries. Her legs occasionally feel heavy but she denies any numbness or tingling in BLE. Lumbar radiographs showed unchanged moderate disc height loss at L4-L5 with degenerative grade 1 anterolisthesis at this level. Moderate lower lumbar predominant facet arthritis. R hip flims showed no significant arthropathy, fracture, or malalignment.  She reports a history of scoliosis and osteopenia and has previously participated in physical therapy with pelvic health therapist for urinary incontinence as well as vestibular therapy for dizziness/imbalance. Patient's husband passed unexpectedly 03/26/20 and she is still trying to cope with the stress from this.  Also trying to get all of the legal matters addressed. Recently saw cardiology and echo shows no significant abnormalities. CXR and stress echo unremarkable. Sleep study negative for sleep apnea.    Diagnostic tests See history    Patient Stated Goals Pain to stop, strengthen legs, feel free to walk and have energy    Currently in Pain? No/denies               TREATMENT   Ther-ex  NuStep L2-3 x 5 minutes for warm-up during history, therapist monitoring fatigue; Hooklying anterior/posterior pelvic tilts 5s hold x 10  each; Hooklying lateral pelvic tilts 5s hold x 10 to each side; Hooklying marching with posterior pelvic tilt x 10BLE; Hooklying SLR with posterior pelvic tilt x 10BLE; Hooklying bridge with posterior pelvic tilt x 10 BLE;  Left side-lying right hip abduction x10; Left side-lying right hip clams with manual resistance from therapist x10; Left side-lying right hip abduction circles clockwise and counterclockwise x10 each direction;   Manual Therapy  R hip single knee to chest stretch x 45s; R hip FADIR stretch x 45s, varied level of flexion to minimize groin pain; R hip FABER stretch x 45s; R hip flexor stretch off the side of  the table with left hip flexion knee-to-chest pelvis lock and therapist blocking right side of  Pelvis ASIS x 45; L sidelying R hip and low back STM with Theraband roller;   Pt educated throughout session about proper posture and technique with exercises. Improved exercise technique, movement at target joints, use of target muscles after min to mod verbal, visual, tactile cues.    Pt demonstrates excellent motivation during session today.  Continued with R hip and low back stretches/manual techniques. Also continued with strengthening for low back/abdominal muscles which pt is able to perform with no reported right hip or low back pain today.  Progressed exercises with patient today including bridges as well as left side-lying hip strengthening.  HEP updated. Pt encouraged to follow-up as scheduled. She will benefit from PT services to address deficits in R low back/hip pain in order to return to full function at home with less pain.                                               PT Short Term Goals - 12/19/20 1523      PT SHORT TERM GOAL #1   Title Pt will be independent with HEP in order to improve strength and decrease back/hip pain in order to improve pain-free function at home and work.    Time 4    Period Weeks    Status New    Target Date 01/16/21             PT Long Term Goals - 12/19/20 1523      PT LONG TERM GOAL #1   Title Pt will decrease worst back/hip pain as reported on NPRS by at least 3 points in order to demonstrate clinically significant reduction in back pain.    Baseline 12/19/20: worst: 10/10    Time 8    Period Weeks    Status New    Target Date 02/13/21      PT LONG TERM GOAL #2   Title Pt will decrease mODI score by at least 13 points in order demonstrate clinically significant reduction in back pain/disability.    Baseline 12/19/20: 24%    Time 8    Period Weeks    Status New    Target Date 02/13/21       PT LONG TERM GOAL #3   Title Pt will improve FOTO to at least 58 in order to demonstrate signficant imrpovement in function related to her her low back/hip pain    Baseline 12/19/20: 34    Time 8    Period Weeks    Status New    Target Date 02/13/21      PT LONG TERM GOAL #4   Title  Pt will increase strength of bilateral hip abduction and extension by at least 1/2 MMT grade in order to demonstrate improvement in strength and function.    Baseline 12/19/20: 4- bilaterally for both abduction and extension    Time 8    Period Weeks    Status New    Target Date 02/13/21                 Plan - 01/02/21 1407    Clinical Impression Statement Pt demonstrates excellent motivation during session today.  Continued with R hip and low back stretches/manual techniques. Also continued with strengthening for low back/abdominal muscles which pt is able to perform with no reported right hip or low back pain today.  Progressed exercises with patient today including bridges as well as left side-lying hip strengthening.  HEP updated. Pt encouraged to follow-up as scheduled. She will benefit from PT services to address deficits in R low back/hip pain in order to return to full function at home with less pain.    Personal Factors and Comorbidities Age;Comorbidity 2    Comorbidities OA, IBS    Examination-Activity Limitations Transfers;Stairs    Examination-Participation Restrictions Church;Community Activity;Meal Prep    Stability/Clinical Decision Making Stable/Uncomplicated    Rehab Potential Good    PT Frequency 2x / week    PT Duration 8 weeks    PT Treatment/Interventions ADLs/Self Care Home Management;Aquatic Therapy;Biofeedback;Canalith Repostioning;Cryotherapy;Electrical Stimulation;Iontophoresis 4mg /ml Dexamethasone;Moist Heat;Traction;Ultrasound;Functional mobility training;Therapeutic activities;Therapeutic exercise;Balance training;Neuromuscular re-education;Manual techniques;Passive range of  motion;Dry needling;Vestibular;Visual/perceptual remediation/compensation;Spinal Manipulations;Joint Manipulations    PT Next Visit Plan LE strengthening and manual techniques    PT Home Exercise Plan Access Code: [email or web address]    Consulted and Agree with Plan of Care Patient           Patient will benefit from skilled therapeutic intervention in order to improve the following deficits and impairments:  Pain,Decreased strength  Visit Diagnosis: Pain in right hip     Problem List Patient Active Problem List   Diagnosis Date Noted  . Ankle swelling 11/26/2020  . Low back pain 11/22/2020  . Hip pain, right 11/22/2020  . Light headedness 06/24/2020  . Hearing loss 06/24/2020  . Change in vision 06/24/2020  . Stress 05/17/2020  . Chest pain 05/16/2020  . Welcome to Medicare preventive visit 06/20/2019  . Viral syndrome 01/30/2019  . Itching 07/21/2017  . Asthma 07/16/2016  . Urinary incontinence 07/16/2016  . SOB (shortness of breath) 04/01/2016  . Bilateral shoulder pain 02/24/2016  . Fatigue 01/28/2016  . Neck fullness 01/28/2016  . Routine general medical examination at a health care facility 07/25/2015  . Health care maintenance 10/09/2014  . Neck pain 11/26/2013  . Hypercholesterolemia 11/26/2013  . History of colonic polyps 05/11/2013  . Hyperbilirubinemia 01/18/2013  . GERD (gastroesophageal reflux disease) 12/03/2012  . Cholelithiasis 11/07/2012  . IBS (irritable bowel syndrome) 11/07/2012  . History of rheumatic fever 08/28/2012  . MVP (mitral valve prolapse) 08/28/2012   Phillips Grout PT, DPT, GCS  Janett Kamath 01/02/2021, 4:06 PM  [facility] [address] [place], [place], [address] Phone: [phone]   Fax:  [phone]  Name: LILANA BLASKO MRN: [id number] Date of Birth: [birth year]

## 2021-01-04 ENCOUNTER — Other Ambulatory Visit: Payer: Self-pay

## 2021-01-04 ENCOUNTER — Ambulatory Visit: Payer: PPO

## 2021-01-04 DIAGNOSIS — M25551 Pain in right hip: Secondary | ICD-10-CM | POA: Diagnosis not present

## 2021-01-04 DIAGNOSIS — M545 Low back pain, unspecified: Secondary | ICD-10-CM

## 2021-01-04 NOTE — Therapy (Signed)
[place] [address] [place], [place], [address] Phone: [phone]   Fax:  [phone]  Physical Therapy Treatment  Patient Details  Name: Lynn Mann MRN: [id number] Date of Birth: 07-03-1951 Referring Provider (PT): Einar Pheasant   Encounter Date: 01/04/2021   PT End of Session - 01/04/21 1457     Visit Number 5    Number of Visits 17    Date for PT Re-Evaluation 02/13/21    Authorization Type eval: 12/19/20    PT Start Time 1400    PT Stop Time 1445    PT Time Calculation (min) 45 min    Activity Tolerance Patient tolerated treatment well    Behavior During Therapy [facility] for tasks assessed/performed             Past Medical History:  Diagnosis Date   Arthritis    C. difficile diarrhea    age 3s-40s    Chicken pox    Cholecystitis 11/2011   Did not require sgy - Dr. Staci Acosta - [facility]  (cholelithiasis)   H/O Clostridium difficile infection    IBS (irritable bowel syndrome)    MRSA exposure 2005   Spider bite   MVP (mitral valve prolapse)    Stable - Dr. Ubaldo Glassing   Rheumatic fever     Past Surgical History:  Procedure Laterality Date   CATARACT EXTRACTION  [id number]   MUSCLE BIOPSY      There were no vitals filed for this visit.   Subjective Assessment - 01/04/21 1404     Subjective Pt reports that she is doing alright today. She reports a dull ache in the right low back starting after last session; reports 1/10 today. She states the bridges created pain in her neck and that she would like to remove the bridges from her POC.    Pertinent History Pt reports R posterior and anterior (groin) hip pain which started 6 weeks ago. Pain started suddenly and then gradually increased but has remained constant since then. She denies any radicular symptoms. Occasionally she has BLE weakness as well which she associates with the hip pain. After the pain started she did have a fall off her mattress onto the floor. She  denies injury or any any increase in her pain. Denies any prior history of hip/back pain or surgeries. Her legs occasionally feel heavy but she denies any numbness or tingling in BLE. Lumbar radiographs showed unchanged moderate disc height loss at L4-L5 with degenerative grade 1 anterolisthesis at this level. Moderate lower lumbar predominant facet arthritis. R hip flims showed no significant arthropathy, fracture, or malalignment.  She reports a history of scoliosis and osteopenia and has previously participated in physical therapy with pelvic health therapist for urinary incontinence as well as vestibular therapy for dizziness/imbalance. Patient's husband passed unexpectedly 03/26/20 and she is still trying to cope with the stress from this.  Also trying to get all of the legal matters addressed. Recently saw cardiology and echo shows no significant abnormalities. CXR and stress echo unremarkable. Sleep study negative for sleep apnea.    Diagnostic tests See history    Patient Stated Goals Pain to stop, strengthen legs, feel free to walk and have energy    Currently in Pain? Yes    Pain Score 1     Pain Location Back    Pain Orientation Right;Lower    Pain Descriptors / Indicators Aching    Pain Type Acute pain    Pain  Onset More than a month ago                 TREATMENT   Ther-ex  NuStep L2-3 x 5 minutes for warm-up during history, therapist monitoring fatigue; Hooklying anterior/posterior pelvic tilts 5s hold x 10 each; Hooklying lateral pelvic tilts 5s hold x 10 to each side; Hooklying marching with posterior pelvic tilt x 10 BLE; Hooklying SLR with posterior pelvic tilt x 10 BLE; Hooklying clamshells with manual resistance from therapist x10; Hooklying hip adduction with manual resistance from therapist x10; Side-lying straight leg hip abduction x10; Static sitting on physioball x 60s; Sitting on physioball with alternating marches x10 each; Sitting on physioball with  alternating LAQ x 10 each; Reviewed and updated HEP    Manual Therapy  R hip single knee to chest stretch x 45s; R hip figure-4 stretch x 45s; R hip distraction x 45s; Reviewed and discussed stretches as part of HEP as well as modifications to decrease hip pain;   Pt educated throughout session about proper posture and technique with exercises. Improved exercise technique, movement at target joints, use of target muscles after min to mod verbal, visual, tactile cues.      Pt demonstrates excellent motivation during session today.  Continued with R hip and low back stretches/manual techniques. Also continued with strengthening for R hip and low back/abdominal muscles which pt is able to perform. Removed bridges from HEP.  Progressed exercises with patient today including seated stability exercises on physioball. Pt encouraged to follow-up as scheduled. She will benefit from PT services to address deficits in R low back/hip pain in order to return to full function at home with less pain.                              PT Short Term Goals - 12/19/20 1523       PT SHORT TERM GOAL #1   Title Pt will be independent with HEP in order to improve strength and decrease back/hip pain in order to improve pain-free function at home and work.    Time 4    Period Weeks    Status New    Target Date 01/16/21               PT Long Term Goals - 12/19/20 1523       PT LONG TERM GOAL #1   Title Pt will decrease worst back/hip pain as reported on NPRS by at least 3 points in order to demonstrate clinically significant reduction in back pain.    Baseline 12/19/20: worst: 10/10    Time 8    Period Weeks    Status New    Target Date 02/13/21      PT LONG TERM GOAL #2   Title Pt will decrease mODI score by at least 13 points in order demonstrate clinically significant reduction in back pain/disability.    Baseline 12/19/20: 24%    Time 8    Period Weeks    Status New     Target Date 02/13/21      PT LONG TERM GOAL #3   Title Pt will improve FOTO to at least 58 in order to demonstrate signficant imrpovement in function related to her her low back/hip pain    Baseline 12/19/20: 34    Time 8    Period Weeks    Status New    Target Date 02/13/21  PT LONG TERM GOAL #4   Title Pt will increase strength of bilateral hip abduction and extension by at least 1/2 MMT grade in order to demonstrate improvement in strength and function.    Baseline 12/19/20: 4- bilaterally for both abduction and extension    Time 8    Period Weeks    Status New    Target Date 02/13/21                   Plan - 01/04/21 1458     Clinical Impression Statement Pt demonstrates excellent motivation during session today.  Continued with R hip and low back stretches/manual techniques. Also continued with strengthening for R hip and low back/abdominal muscles which pt is able to perform. Removed bridges from HEP.  Progressed exercises with patient today including seated stability exercises on physioball. Pt encouraged to follow-up as scheduled. She will benefit from PT services to address deficits in R low back/hip pain in order to return to full function at home with less pain.    Personal Factors and Comorbidities Age;Comorbidity 2    Comorbidities OA, IBS    Examination-Activity Limitations Transfers;Stairs    Examination-Participation Restrictions Church;Community Activity;Meal Prep    Stability/Clinical Decision Making Stable/Uncomplicated    Rehab Potential Good    PT Frequency 2x / week    PT Duration 8 weeks    PT Treatment/Interventions ADLs/Self Care Home Management;Aquatic Therapy;Biofeedback;Canalith Repostioning;Cryotherapy;Electrical Stimulation;Iontophoresis 4mg /ml Dexamethasone;Moist Heat;Traction;Ultrasound;Functional mobility training;Therapeutic activities;Therapeutic exercise;Balance training;Neuromuscular re-education;Manual techniques;Passive range of  motion;Dry needling;Vestibular;Visual/perceptual remediation/compensation;Spinal Manipulations;Joint Manipulations    PT Next Visit Plan LE strengthening and manual techniques    PT Home Exercise Plan Access Code: [email or web address]    Consulted and Agree with Plan of Care Patient             Patient will benefit from skilled therapeutic intervention in order to improve the following deficits and impairments:  Pain, Decreased strength  Visit Diagnosis: Pain in right hip  Acute right-sided low back pain, unspecified whether sciatica present     Problem List Patient Active Problem List   Diagnosis Date Noted   Ankle swelling 11/26/2020   Low back pain 11/22/2020   Hip pain, right 11/22/2020   Light headedness 06/24/2020   Hearing loss 06/24/2020   Change in vision 06/24/2020   Stress 05/17/2020   Chest pain 05/16/2020   Welcome to Medicare preventive visit 06/20/2019   Viral syndrome 01/30/2019   Itching 07/21/2017   Asthma 07/16/2016   Urinary incontinence 07/16/2016   SOB (shortness of breath) 04/01/2016   Bilateral shoulder pain 02/24/2016   Fatigue 01/28/2016   Neck fullness 01/28/2016   Routine general medical examination at a health care facility 07/25/2015   Health care maintenance 10/09/2014   Neck pain 11/26/2013   Hypercholesterolemia 11/26/2013   History of colonic polyps 05/11/2013   Hyperbilirubinemia 01/18/2013   GERD (gastroesophageal reflux disease) 12/03/2012   Cholelithiasis 11/07/2012   IBS (irritable bowel syndrome) 11/07/2012   History of rheumatic fever 08/28/2012   MVP (mitral valve prolapse) 08/28/2012   Phillips Grout PT, DPT, GCS  Denika Krone 01/05/2021, 2:33 PM  [place] [facility] [address] [place], [place], [address] Phone: [phone]   Fax:  [phone]  Name: Lynn Mann MRN: [id number] Date of Birth: 1950/11/23

## 2021-01-12 DIAGNOSIS — Z20822 Contact with and (suspected) exposure to covid-19: Secondary | ICD-10-CM | POA: Diagnosis not present

## 2021-01-16 ENCOUNTER — Ambulatory Visit: Payer: PPO

## 2021-01-16 ENCOUNTER — Other Ambulatory Visit: Payer: Self-pay

## 2021-01-16 DIAGNOSIS — M25551 Pain in right hip: Secondary | ICD-10-CM

## 2021-01-16 DIAGNOSIS — M545 Low back pain, unspecified: Secondary | ICD-10-CM

## 2021-01-16 NOTE — Therapy (Signed)
[facility] [address] [place], [place], [address] Phone: [phone]   Fax:  [phone]  Physical Therapy Treatment  Patient Details  Name: Lynn Mann MRN: [id number] Date of Birth: 1951/06/08 Referring Provider (PT): Einar Pheasant   Encounter Date: 01/16/2021   PT End of Session - 01/16/21 1407     Visit Number 6    Number of Visits 17    Date for PT Re-Evaluation 02/13/21    Authorization Type eval: 12/19/20    PT Start Time 9628    PT Stop Time 1441    PT Time Calculation (min) 39 min    Activity Tolerance Patient tolerated treatment well    Behavior During Therapy [facility] for tasks assessed/performed             Past Medical History:  Diagnosis Date   Arthritis    C. difficile diarrhea    age 48s-40s    Chicken pox    Cholecystitis 11/2011   Did not require sgy - Dr. Staci Acosta - [facility]  (cholelithiasis)   H/O Clostridium difficile infection    IBS (irritable bowel syndrome)    MRSA exposure 2005   Spider bite   MVP (mitral valve prolapse)    Stable - Dr. Ubaldo Glassing   Rheumatic fever     Past Surgical History:  Procedure Laterality Date   CATARACT EXTRACTION  [id number]   MUSCLE BIOPSY      There were no vitals filed for this visit.   Subjective Assessment - 01/16/21 1401     Subjective Pt reports that she is doing alright today. Denies any pain in her back upon arrival. She was sick last week but tested negative for COVID multiple times. No specific questions or concerns currently.    Pertinent History Pt reports R posterior and anterior (groin) hip pain which started 6 weeks ago. Pain started suddenly and then gradually increased but has remained constant since then. She denies any radicular symptoms. Occasionally she has BLE weakness as well which she associates with the hip pain. After the pain started she did have a fall off her mattress onto the floor. She denies injury or any any increase in her pain.  Denies any prior history of hip/back pain or surgeries. Her legs occasionally feel heavy but she denies any numbness or tingling in BLE. Lumbar radiographs showed unchanged moderate disc height loss at L4-L5 with degenerative grade 1 anterolisthesis at this level. Moderate lower lumbar predominant facet arthritis. R hip flims showed no significant arthropathy, fracture, or malalignment.  She reports a history of scoliosis and osteopenia and has previously participated in physical therapy with pelvic health therapist for urinary incontinence as well as vestibular therapy for dizziness/imbalance. Patient's husband passed unexpectedly 03/26/20 and she is still trying to cope with the stress from this.  Also trying to get all of the legal matters addressed. Recently saw cardiology and echo shows no significant abnormalities. CXR and stress echo unremarkable. Sleep study negative for sleep apnea.    Diagnostic tests See history    Patient Stated Goals Pain to stop, strengthen legs, feel free to walk and have energy    Currently in Pain? No/denies    Pain Onset --                TREATMENT     Ther-ex  NuStep L2-3 x 5 minutes for warm-up during history, therapist monitoring fatigue; Hooklying anterior/posterior pelvic tilts 5s hold x 1 minute; Hooklying  lateral pelvic tilts 5s hold x 1 minute; Hooklying alternating marching with posterior pelvic tilt x 1 minute; Hooklying SLR with posterior pelvic tilt x 30s BLE; Sidelying clamshells x 10 BLE, second set deferred secondary to fatigue; Side-lying straight leg hip abduction x 5 LLE, x 6 RLE, discontinued secondary to fatigue;; Hooklying hip adduction with manual resistance from therapist x 10; Seated alternating LAQ with 3# ankle weights (AW), 2 x 1 minute Seated alternating marches with 3# AW, 2 x 1 minute;    Manual Therapy  Single knee to chest stretch x 45s bilateral; Figure 4 stretch x 45s bilateral; FADIR stretch x 45s  bilateral; Hamstring stretch x 45s bilatreal; R hip distraction x 45s;     Pt educated throughout session about proper posture and technique with exercises. Improved exercise technique, movement at target joints, use of target muscles after min to mod verbal, visual, tactile cues.      Pt demonstrates excellent motivation during session today however she is much more easily fatigued today after her recent illness. Continued with R hip and low back stretches/manual techniques. Also continued with strengthening for R hip and low back/abdominal muscles which pt is able to perform but with some limitations secondary to fatigue. Pt encouraged to follow-up as scheduled. She will benefit from PT services to address deficits in R low back/hip pain in order to return to full function at home with less pain.                          PT Short Term Goals - 12/19/20 1523       PT SHORT TERM GOAL #1   Title Pt will be independent with HEP in order to improve strength and decrease back/hip pain in order to improve pain-free function at home and work.    Time 4    Period Weeks    Status New    Target Date 01/16/21               PT Long Term Goals - 12/19/20 1523       PT LONG TERM GOAL #1   Title Pt will decrease worst back/hip pain as reported on NPRS by at least 3 points in order to demonstrate clinically significant reduction in back pain.    Baseline 12/19/20: worst: 10/10    Time 8    Period Weeks    Status New    Target Date 02/13/21      PT LONG TERM GOAL #2   Title Pt will decrease mODI score by at least 13 points in order demonstrate clinically significant reduction in back pain/disability.    Baseline 12/19/20: 24%    Time 8    Period Weeks    Status New    Target Date 02/13/21      PT LONG TERM GOAL #3   Title Pt will improve FOTO to at least 58 in order to demonstrate signficant imrpovement in function related to her her low back/hip pain    Baseline  12/19/20: 34    Time 8    Period Weeks    Status New    Target Date 02/13/21      PT LONG TERM GOAL #4   Title Pt will increase strength of bilateral hip abduction and extension by at least 1/2 MMT grade in order to demonstrate improvement in strength and function.    Baseline 12/19/20: 4- bilaterally for both abduction and extension  Time 8    Period Weeks    Status New    Target Date 02/13/21                   Plan - 01/16/21 1421     Clinical Impression Statement Pt demonstrates excellent motivation during session today however she is much more easily fatigued today after her recent illness. Continued with R hip and low back stretches/manual techniques. Also continued with strengthening for R hip and low back/abdominal muscles which pt is able to perform but with some limitations secondary to fatigue. Pt encouraged to follow-up as scheduled. She will benefit from PT services to address deficits in R low back/hip pain in order to return to full function at home with less pain.    Personal Factors and Comorbidities Age;Comorbidity 2    Comorbidities OA, IBS    Examination-Activity Limitations Transfers;Stairs    Examination-Participation Restrictions Church;Community Activity;Meal Prep    Stability/Clinical Decision Making Stable/Uncomplicated    Rehab Potential Good    PT Frequency 2x / week    PT Duration 8 weeks    PT Treatment/Interventions ADLs/Self Care Home Management;Aquatic Therapy;Biofeedback;Canalith Repostioning;Cryotherapy;Electrical Stimulation;Iontophoresis 4mg /ml Dexamethasone;Moist Heat;Traction;Ultrasound;Functional mobility training;Therapeutic activities;Therapeutic exercise;Balance training;Neuromuscular re-education;Manual techniques;Passive range of motion;Dry needling;Vestibular;Visual/perceptual remediation/compensation;Spinal Manipulations;Joint Manipulations    PT Next Visit Plan LE strengthening and manual techniques    PT Home Exercise Plan Access  Code: [email or web address]    Consulted and Agree with Plan of Care Patient             Patient will benefit from skilled therapeutic intervention in order to improve the following deficits and impairments:  Pain, Decreased strength  Visit Diagnosis: Pain in right hip  Acute right-sided low back pain, unspecified whether sciatica present     Problem List Patient Active Problem List   Diagnosis Date Noted   Ankle swelling 11/26/2020   Low back pain 11/22/2020   Hip pain, right 11/22/2020   Light headedness 06/24/2020   Hearing loss 06/24/2020   Change in vision 06/24/2020   Stress 05/17/2020   Chest pain 05/16/2020   Welcome to Medicare preventive visit 06/20/2019   Viral syndrome 01/30/2019   Itching 07/21/2017   Asthma 07/16/2016   Urinary incontinence 07/16/2016   SOB (shortness of breath) 04/01/2016   Bilateral shoulder pain 02/24/2016   Fatigue 01/28/2016   Neck fullness 01/28/2016   Routine general medical examination at a health care facility 07/25/2015   Health care maintenance 10/09/2014   Neck pain 11/26/2013   Hypercholesterolemia 11/26/2013   History of colonic polyps 05/11/2013   Hyperbilirubinemia 01/18/2013   GERD (gastroesophageal reflux disease) 12/03/2012   Cholelithiasis 11/07/2012   IBS (irritable bowel syndrome) 11/07/2012   History of rheumatic fever 08/28/2012   MVP (mitral valve prolapse) 08/28/2012   Phillips Grout PT, DPT, GCS  Lenn Volker 01/16/2021, 3:25 PM  [place] [facility] [address] [place], [place], [address] Phone: [phone]   Fax:  [phone]  Name: Lynn Mann MRN: [id number] Date of Birth: April 03, 1951

## 2021-01-18 ENCOUNTER — Other Ambulatory Visit: Payer: Self-pay

## 2021-01-18 ENCOUNTER — Ambulatory Visit: Payer: PPO

## 2021-01-18 DIAGNOSIS — M25551 Pain in right hip: Secondary | ICD-10-CM | POA: Diagnosis not present

## 2021-01-18 DIAGNOSIS — M545 Low back pain, unspecified: Secondary | ICD-10-CM

## 2021-01-18 NOTE — Therapy (Signed)
[facility] [address] [place], [place], [address] Phone: [phone]   Fax:  [phone]  Physical Therapy Treatment  Patient Details  Name: Lynn Mann MRN: [id number] Date of Birth: [birth year] Referring Provider (PT): Einar Pheasant   Encounter Date: 01/18/2021   PT End of Session - 01/18/21 1525     Visit Number 7    Number of Visits 17    Date for PT Re-Evaluation 02/13/21    Authorization Type eval: 12/19/20    PT Start Time 1421    PT Stop Time 1445    PT Time Calculation (min) 24 min    Activity Tolerance Patient tolerated treatment well    Behavior During Therapy [facility] for tasks assessed/performed             Past Medical History:  Diagnosis Date   Arthritis    C. difficile diarrhea    age 38s-40s    Chicken pox    Cholecystitis 11/2011   Did not require sgy - Dr. Staci Acosta - [facility]  (cholelithiasis)   H/O Clostridium difficile infection    IBS (irritable bowel syndrome)    MRSA exposure 2005   Spider bite   MVP (mitral valve prolapse)    Stable - Dr. Ubaldo Glassing   Rheumatic fever     Past Surgical History:  Procedure Laterality Date   CATARACT EXTRACTION  [id number]   MUSCLE BIOPSY      There were no vitals filed for this visit.   Subjective Assessment - 01/18/21 1423     Subjective Pt reports that she is doing alright today. States she has had general body soreness today. Denies any specific pain in her back upon arrival. No specific questions or concerns currently.    Pertinent History Pt reports R posterior and anterior (groin) hip pain which started 6 weeks ago. Pain started suddenly and then gradually increased but has remained constant since then. She denies any radicular symptoms. Occasionally she has BLE weakness as well which she associates with the hip pain. After the pain started she did have a fall off her mattress onto the floor. She denies injury or any any increase in her pain. Denies any prior  history of hip/back pain or surgeries. Her legs occasionally feel heavy but she denies any numbness or tingling in BLE. Lumbar radiographs showed unchanged moderate disc height loss at L4-L5 with degenerative grade 1 anterolisthesis at this level. Moderate lower lumbar predominant facet arthritis. R hip flims showed no significant arthropathy, fracture, or malalignment.  She reports a history of scoliosis and osteopenia and has previously participated in physical therapy with pelvic health therapist for urinary incontinence as well as vestibular therapy for dizziness/imbalance. Patient's husband passed unexpectedly 03/26/20 and she is still trying to cope with the stress from this.  Also trying to get all of the legal matters addressed. Recently saw cardiology and echo shows no significant abnormalities. CXR and stress echo unremarkable. Sleep study negative for sleep apnea.    Diagnostic tests See history    Patient Stated Goals Pain to stop, strengthen legs, feel free to walk and have energy    Currently in Pain? No/denies   General body soreness upon arrival but denies specific back pain               TREATMENT     Ther-ex  Hooklying anterior/posterior pelvic tilts 5s hold x 1 minute; Hooklying lateral pelvic tilts 5s hold x 1 minute; Hooklying alternating  marching with posterior pelvic tilt x 1 minute; Hooklying SLR with posterior pelvic tilt x 30s BLE; Hooklying clamshells x 10 BLE, with manual resistance by therapist; Hooklying hip adduction with manual resistance from therapist x 10;   Side-lying straight leg hip abduction x 5 LLE, x 6 RLE, discontinued secondary to fatigue;;   Manual Therapy  Single knee to chest stretch x 45s bilateral; Figure 4 stretch x 45s bilateral; FADIR stretch x 45s bilateral; Lumbar rotation stretch x 45s bilateral;      Pt educated throughout session about proper posture and technique with exercises. Improved exercise technique, movement at  target joints, use of target muscles after min to mod verbal, visual, tactile cues.      Pt demonstrates excellent motivation during session today however she arrived late so session had to be abbreviated. Continued with R hip and low back stretches/manual techniques. Also continued with strengthening for R hip and low back/abdominal muscles which pt is able to perform without reported increase in her pain. She denies any excessive fatigue during session today. Will continue to progress strengthening for long-term management of pain. Pt encouraged to follow-up as scheduled. She will benefit from PT services to address deficits in R low back/hip pain in order to return to full function at home with less pain.                               PT Short Term Goals - 12/19/20 1523       PT SHORT TERM GOAL #1   Title Pt will be independent with HEP in order to improve strength and decrease back/hip pain in order to improve pain-free function at home and work.    Time 4    Period Weeks    Status New    Target Date 01/16/21               PT Long Term Goals - 12/19/20 1523       PT LONG TERM GOAL #1   Title Pt will decrease worst back/hip pain as reported on NPRS by at least 3 points in order to demonstrate clinically significant reduction in back pain.    Baseline 12/19/20: worst: 10/10    Time 8    Period Weeks    Status New    Target Date 02/13/21      PT LONG TERM GOAL #2   Title Pt will decrease mODI score by at least 13 points in order demonstrate clinically significant reduction in back pain/disability.    Baseline 12/19/20: 24%    Time 8    Period Weeks    Status New    Target Date 02/13/21      PT LONG TERM GOAL #3   Title Pt will improve FOTO to at least 58 in order to demonstrate signficant imrpovement in function related to her her low back/hip pain    Baseline 12/19/20: 34    Time 8    Period Weeks    Status New    Target Date 02/13/21      PT  LONG TERM GOAL #4   Title Pt will increase strength of bilateral hip abduction and extension by at least 1/2 MMT grade in order to demonstrate improvement in strength and function.    Baseline 12/19/20: 4- bilaterally for both abduction and extension    Time 8    Period Weeks    Status New  Target Date 02/13/21                   Plan - 01/18/21 1525     Clinical Impression Statement Pt demonstrates excellent motivation during session today however she arrived late so session had to be abbreviated. Continued with R hip and low back stretches/manual techniques. Also continued with strengthening for R hip and low back/abdominal muscles which pt is able to perform without reported increase in her pain. She denies any excessive fatigue during session today. Will continue to progress strengthening for long-term management of pain. Pt encouraged to follow-up as scheduled. She will benefit from PT services to address deficits in R low back/hip pain in order to return to full function at home with less pain.    Personal Factors and Comorbidities Age;Comorbidity 2    Comorbidities OA, IBS    Examination-Activity Limitations Transfers;Stairs    Examination-Participation Restrictions Church;Community Activity;Meal Prep    Stability/Clinical Decision Making Stable/Uncomplicated    Rehab Potential Good    PT Frequency 2x / week    PT Duration 8 weeks    PT Treatment/Interventions ADLs/Self Care Home Management;Aquatic Therapy;Biofeedback;Canalith Repostioning;Cryotherapy;Electrical Stimulation;Iontophoresis 4mg /ml Dexamethasone;Moist Heat;Traction;Ultrasound;Functional mobility training;Therapeutic activities;Therapeutic exercise;Balance training;Neuromuscular re-education;Manual techniques;Passive range of motion;Dry needling;Vestibular;Visual/perceptual remediation/compensation;Spinal Manipulations;Joint Manipulations    PT Next Visit Plan LE strengthening and manual techniques    PT Home  Exercise Plan Access Code: [email or web address]    Consulted and Agree with Plan of Care Patient             Patient will benefit from skilled therapeutic intervention in order to improve the following deficits and impairments:  Pain, Decreased strength  Visit Diagnosis: Pain in right hip  Acute right-sided low back pain, unspecified whether sciatica present     Problem List Patient Active Problem List   Diagnosis Date Noted   Ankle swelling 11/26/2020   Low back pain 11/22/2020   Hip pain, right 11/22/2020   Light headedness 06/24/2020   Hearing loss 06/24/2020   Change in vision 06/24/2020   Stress 05/17/2020   Chest pain 05/16/2020   Welcome to Medicare preventive visit 06/20/2019   Viral syndrome 01/30/2019   Itching 07/21/2017   Asthma 07/16/2016   Urinary incontinence 07/16/2016   SOB (shortness of breath) 04/01/2016   Bilateral shoulder pain 02/24/2016   Fatigue 01/28/2016   Neck fullness 01/28/2016   Routine general medical examination at a health care facility 07/25/2015   Health care maintenance 10/09/2014   Neck pain 11/26/2013   Hypercholesterolemia 11/26/2013   History of colonic polyps 05/11/2013   Hyperbilirubinemia 01/18/2013   GERD (gastroesophageal reflux disease) 12/03/2012   Cholelithiasis 11/07/2012   IBS (irritable bowel syndrome) 11/07/2012   History of rheumatic fever 08/28/2012   MVP (mitral valve prolapse) 08/28/2012   Phillips Grout PT, DPT, GCS  Joesiah Lonon 01/18/2021, 3:28 PM  [facility] [address] [place], [place], [address] Phone: [phone]   Fax:  [phone]  Name: Lynn Mann MRN: [id number] Date of Birth: [birth year]

## 2021-01-20 ENCOUNTER — Encounter: Payer: Self-pay | Admitting: Emergency Medicine

## 2021-01-20 ENCOUNTER — Ambulatory Visit
Admission: EM | Admit: 2021-01-20 | Discharge: 2021-01-20 | Disposition: A | Discharge status: Home / Self Care | Payer: PPO | Attending: Sports Medicine | Admitting: Sports Medicine

## 2021-01-20 ENCOUNTER — Other Ambulatory Visit: Payer: Self-pay

## 2021-01-20 DIAGNOSIS — M79605 Pain in left leg: Secondary | ICD-10-CM | POA: Diagnosis not present

## 2021-01-20 DIAGNOSIS — S8012XA Contusion of left lower leg, initial encounter: Secondary | ICD-10-CM | POA: Diagnosis not present

## 2021-01-20 DIAGNOSIS — S99921A Unspecified injury of right foot, initial encounter: Secondary | ICD-10-CM | POA: Diagnosis not present

## 2021-01-20 DIAGNOSIS — W19XXXA Unspecified fall, initial encounter: Secondary | ICD-10-CM | POA: Diagnosis not present

## 2021-01-20 NOTE — ED Provider Notes (Signed)
[facility]    CSN: [id number] Arrival date & time: 01/20/21  0957      History   Chief Complaint Chief Complaint  Patient presents with   Fall   Leg Pain    left    HPI Lynn Mann is a 70 y.o. female.   Pleasant 70 year old female who presents for evaluation of the above issue.  She normally sees Dr. Lina Sayre for ongoing medical care.  She says around 3 AM she was at home got out of bed and tripped sustaining an injury to her right foot including the lateral 3 toes.  She did ice her toes.  When she woke up this morning she noted some swelling and some soreness around the Pez anserine bursa on the left knee.  This concerned her enough to come into the urgent care.  She does take a full aspirin, 325 mg but she says she has not taken it in several days.  She is not on any other blood thinners.  No red flag signs or symptoms elicited on history.   Past Medical History:  Diagnosis Date   Arthritis    C. difficile diarrhea    age 70s-40s    Chicken pox    Cholecystitis 11/2011   Did not require sgy - Dr. Staci Acosta - [facility]  (cholelithiasis)   H/O Clostridium difficile infection    IBS (irritable bowel syndrome)    MRSA exposure 2005   Spider bite   MVP (mitral valve prolapse)    Stable - Dr. Ubaldo Glassing   Rheumatic fever     Patient Active Problem List   Diagnosis Date Noted   Ankle swelling 11/26/2020   Low back pain 11/22/2020   Hip pain, right 11/22/2020   Light headedness 06/24/2020   Hearing loss 06/24/2020   Change in vision 06/24/2020   Stress 05/17/2020   Chest pain 05/16/2020   Welcome to Medicare preventive visit 06/20/2019   Viral syndrome 01/30/2019   Itching 07/21/2017   Asthma 07/16/2016   Urinary incontinence 07/16/2016   SOB (shortness of breath) 04/01/2016   Bilateral shoulder pain 02/24/2016   Fatigue 01/28/2016   Neck fullness 01/28/2016   Routine general medical examination at a health care facility 07/25/2015   Health care  maintenance 10/09/2014   Neck pain 11/26/2013   Hypercholesterolemia 11/26/2013   History of colonic polyps 05/11/2013   Hyperbilirubinemia 01/18/2013   GERD (gastroesophageal reflux disease) 12/03/2012   Cholelithiasis 11/07/2012   IBS (irritable bowel syndrome) 11/07/2012   History of rheumatic fever 08/28/2012   MVP (mitral valve prolapse) 08/28/2012    Past Surgical History:  Procedure Laterality Date   CATARACT EXTRACTION  [id number]   MUSCLE BIOPSY      OB History   No obstetric history on file.      Home Medications    Prior to Admission medications   Medication Sig Start Date End Date Taking? Authorizing Provider  aspirin 325 MG tablet Take by mouth.   Yes [provider]  Multiple Vitamins-Minerals (PRESERVISION AREDS 2 PO) Take by mouth.   Yes [provider]  albuterol (VENTOLIN HFA) 108 (90 Base) MCG/ACT inhaler Inhale 2 puffs into the lungs every 6 (six) hours as needed for wheezing or shortness of breath. 06/26/20   Einar Pheasant, MD    Family History Family History  Problem Relation Age of Onset   Arthritis Mother    Stroke Mother    Diabetes Mother    Hypertension Mother  Arthritis Father    Stroke Father    Diabetes Paternal Grandmother    Cancer Paternal Uncle        colon   Heart disease Other        maternal and paternal side   Breast cancer Paternal 40    Breast cancer Cousin    Breast cancer Cousin        female cousin   Breast cancer Cousin     Social History Social History   Tobacco Use   Smoking status: Never   Smokeless tobacco: Never  Vaping Use   Vaping Use: Never used  Substance Use Topics   Alcohol use: Yes    Alcohol/week: 0.0 standard drinks    Comment: Rarely   Drug use: No     Allergies   Other, Adhesive [tape], Tartrazine, and Yellow dyes (non-tartrazine)   Review of Systems Review of Systems  Constitutional:  Positive for activity change. Negative for appetite change, chills,  diaphoresis, fatigue and fever.  HENT:  Negative for congestion, ear pain, postnasal drip, rhinorrhea, sinus pressure, sinus pain, sneezing and sore throat.   Eyes:  Negative for pain.  Respiratory:  Negative for cough, chest tightness and shortness of breath.   Cardiovascular:  Negative for chest pain and palpitations.  Gastrointestinal:  Negative for abdominal pain, diarrhea, nausea and vomiting.  Genitourinary:  Negative for dysuria.  Musculoskeletal:  Positive for arthralgias. Negative for back pain, gait problem, myalgias and neck pain.  Skin:  Positive for color change. Negative for pallor and rash.  Neurological:  Negative for dizziness, light-headedness, numbness and headaches.  All other systems reviewed and are negative.   Physical Exam Triage Vital Signs ED Triage Vitals  Enc Vitals Group     BP 01/20/21 1023 132/65     Pulse Rate 01/20/21 1023 79     Resp 01/20/21 1023 14     Temp 01/20/21 1023 98.2 F (36.8 C)     Temp Source 01/20/21 1023 Oral     SpO2 01/20/21 1023 98 %     Weight 01/20/21 1021 179 lb 3.7 oz (81.3 kg)     Height 01/20/21 1021 5\' 7"  (1.702 m)     Head Circumference --      Peak Flow --      Pain Score 01/20/21 1020 5     Pain Loc --      Pain Edu? --      Excl. in [place]? --    No data found.  Updated Vital Signs BP 132/65 (BP Location: Left Arm)   Pulse 79   Temp 98.2 F (36.8 C) (Oral)   Resp 14   Ht 5\' 7"  (1.702 m)   Wt 81.3 kg   SpO2 98%   BMI 28.07 kg/m   Visual Acuity Right Eye Distance:   Left Eye Distance:   Bilateral Distance:    Right Eye Near:   Left Eye Near:    Bilateral Near:     Physical Exam Vitals and nursing note reviewed.  Constitutional:      General: She is not in acute distress.    Appearance: Normal appearance. She is not ill-appearing, toxic-appearing or diaphoretic.  HENT:     Head: Normocephalic and atraumatic.     Nose: Nose normal.     Mouth/Throat:     Mouth: Mucous membranes are moist.  Eyes:      Conjunctiva/sclera: Conjunctivae normal.     Pupils: Pupils are equal, round, and reactive to  light.  Cardiovascular:     Rate and Rhythm: Normal rate and regular rhythm.     Pulses: Normal pulses.     Heart sounds: Normal heart sounds. No murmur heard.   No friction rub. No gallop.  Pulmonary:     Effort: Pulmonary effort is normal.     Breath sounds: Normal breath sounds. No stridor. No wheezing, rhonchi or rales.  Musculoskeletal:     Cervical back: Normal range of motion and neck supple.     Comments: Right foot: She has some minimal tenderness over the third fourth and fifth toes.  Early ecchymosis is noted.  No significant swelling.  Has full range of motion.  Flexor and extensor tendons are intact.  There is no midfoot instability.  Left knee: She has an early hematoma forming over the Pez anserine bursa area.  Is tender to palpation.  She has full active range of motion of the knee.  There is no joint line tenderness.  Good patellar mobility.  There is no ligamentous laxity.  [place] and [place] tests are negative.  No swelling in the calf.  Tinel's test is negative over the fibula and the tibia.  Neurovascular: Normal sensation 2+ pulses distally.  Skin:    General: Skin is warm and dry.     Capillary Refill: Capillary refill takes less than 2 seconds.     Coloration: Skin is not jaundiced.     Findings: Bruising present. No rash.  Neurological:     General: No focal deficit present.     Mental Status: She is alert and oriented to person, place, and time.     UC Treatments / Results  Labs (all labs ordered are listed, but only abnormal results are displayed) Labs Reviewed - No data to display  EKG   Radiology No results found.  Procedures Procedures (including critical care time)  Medications Ordered in UC Medications - No data to display  Initial Impression / Assessment and Plan / UC Course  I have reviewed the triage vital signs and the nursing  notes.  Pertinent labs & imaging results that were available during my care of the patient were reviewed by me and considered in my medical decision making (see chart for details).  Clinical impression: 1.  Fall at home about 8 to 9 hours ago. 2.  Left leg contusion with hematoma and ecchymosis. 3.  Injury to the third fourth and fifth toes of the right foot without evidence of fracture.  Treatment plan: 1.  The findings and treatment plan were discussed in detail with the patient.  Patient was in agreement. 2.  I recommended just Tylenol only.  No anti-inflammatories or aspirin products as we do not want to feed that hematoma.  She voiced verbal understanding. 3.  Educational handouts provided.  She will need to ice that several times a day. 4.  She has a fairly normal gait pattern so no x-rays were needed today. 5.  Advised her to reach out to Dr. Nicki Reaper and give her an update on her situation.  She can provide further recommendation for aspirin and NSAID products moving forward. 6.  Went over the red flag signs and symptoms and when to seek out a higher level of care.  If she was develop any increased pain, swelling, calf pain, shortness of breath she knows to call 911 and go to the emergency room for a higher level of care. 7.  She was stable upon discharge and will follow-up here as  needed.    Final Clinical Impressions(s) / UC Diagnoses   Final diagnoses:  Fall, initial encounter  Left leg pain  Contusion of left leg, initial encounter  Injury of right toe, initial encounter  Traumatic ecchymosis of left lower leg, initial encounter     Discharge Instructions      As we discussed, on exam I do not believe you have anything broken.  Your toes have full range of motion.  Continue to ice and monitor symptoms.  As for your left leg with the bruising, it is consistent with a hematoma.  I recommend icing.  Do not heat as this will make the swelling worse and may exacerbate the  bruising. Please see educational handouts. As we discussed please do not use any more aspirin or anti-inflammatories until you are seen by Dr. Nicki Reaper to advise you as to the appropriate medications that you can use.  I would recommend just using Tylenol as needed for any discomfort. If your symptoms worsen or you develop any shortness of breath or leg swelling then please go to the emergency room as you may need it advanced imaging and a higher level of care which we cannot provide in the urgent care setting.     ED Prescriptions   None    PDMP not reviewed this encounter.   Verda Cumins, MD 01/20/21 1101

## 2021-01-20 NOTE — Discharge Instructions (Addendum)
As we discussed, on exam I do not believe you have anything broken.  Your toes have full range of motion.  Continue to ice and monitor symptoms.  As for your left leg with the bruising, it is consistent with a hematoma.  I recommend icing.  Do not heat as this will make the swelling worse and may exacerbate the bruising. Please see educational handouts. As we discussed please do not use any more aspirin or anti-inflammatories until you are seen by Dr. Nicki Reaper to advise you as to the appropriate medications that you can use.  I would recommend just using Tylenol as needed for any discomfort. If your symptoms worsen or you develop any shortness of breath or leg swelling then please go to the emergency room as you may need it advanced imaging and a higher level of care which we cannot provide in the urgent care setting.

## 2021-01-20 NOTE — ED Triage Notes (Addendum)
Patient states that she fell last night and injured her left lower leg.  Patient swelling, pain and bruising in her left lower leg below her left knee.  Patient also reports right knee pain.  Patient also c/o right collar bone pain that got worse after her fall.

## 2021-01-22 ENCOUNTER — Telehealth: Payer: Self-pay | Admitting: Internal Medicine

## 2021-01-22 NOTE — Telephone Encounter (Signed)
LMTCB

## 2021-01-22 NOTE — Telephone Encounter (Signed)
Reviewed note.  Please call and confirm she is doing ok.  How does her leg/knee - look.  How is she doing?  Follow up?

## 2021-01-22 NOTE — Telephone Encounter (Signed)
Patient seen in ER for Contusion to left lower leg and left foot 4th and 5th toes , no apparent fracture per ER notes.  Patient DC note pasted below.  Discharge Instructions        As we discussed, on exam I do not believe you have anything broken.  Your toes have full range of motion.  Continue to ice and monitor symptoms.  As for your left leg with the bruising, it is consistent with a hematoma.  I recommend icing.  Do not heat as this will make the swelling worse and may exacerbate the bruising. Please see educational handouts. As we discussed please do not use any more aspirin or anti-inflammatories until you are seen by Dr. Nicki Reaper to advise you as to the appropriate medications that you can use.  I would recommend just using Tylenol as needed for any discomfort. If your symptoms worsen or you develop any shortness of breath or leg swelling then please go to the emergency room as you may need it advanced imaging and a higher level of care which we cannot provide in the urgent care setting.

## 2021-01-22 NOTE — Telephone Encounter (Signed)
Spoke with pt and she stated that her leg is still swollen and has a hematoma on it that comes and goes. Pt stated that when she ices the hematoma it goes down. She has been icing it every 2 hours for about 20 minutes or so and elevating the ankle on pillows. Pt was not very happy with the care she received at [facility]. She stated that she let provider know that her collarbone was also hurting and wasn't sure if it was from the fall. Pt stated that she is not in any significant pain and is managing pretty well right now. Pt is just concerned about clotting and if a hematoma could cause a blood clot.

## 2021-01-22 NOTE — Telephone Encounter (Signed)
FYI

## 2021-01-23 NOTE — Telephone Encounter (Signed)
Reviewed. If persistent problems, will need to be reevaluated.

## 2021-01-24 NOTE — Telephone Encounter (Signed)
Spoke with patient. She feels that her leg is slowly getting better. She is continuing to elevate it while she is sitting down. No increased pain. No sob. Swelling is localized to the hematoma. She is going to continue to monitor and if any change in symptoms she is going to go to the ED to be evaluated and determine need for further imaging. Advised patient to let me know if she needs anything. Pt agreed

## 2021-01-24 NOTE — Telephone Encounter (Signed)
LMTCB

## 2021-01-30 ENCOUNTER — Other Ambulatory Visit: Payer: Self-pay

## 2021-01-30 ENCOUNTER — Ambulatory Visit: Payer: PPO | Attending: Internal Medicine

## 2021-01-30 DIAGNOSIS — M25551 Pain in right hip: Secondary | ICD-10-CM | POA: Insufficient documentation

## 2021-01-30 DIAGNOSIS — M545 Low back pain, unspecified: Secondary | ICD-10-CM | POA: Diagnosis not present

## 2021-01-30 NOTE — Therapy (Signed)
[facility] [address] [place], [place], [address] Phone: [phone]   Fax:  [phone]  Physical Therapy Treatment  Patient Details  Name: Lynn Mann MRN: [id number] Date of Birth: 1950-12-08 Referring Provider (PT): Einar Pheasant   Encounter Date: 01/30/2021   PT End of Session - 01/30/21 1430     Visit Number 8    Number of Visits 17    Date for PT Re-Evaluation 02/13/21    Authorization Type eval: 12/19/20    PT Start Time 3532    PT Stop Time 1445    PT Time Calculation (min) 43 min    Activity Tolerance Patient tolerated treatment well    Behavior During Therapy [facility] for tasks assessed/performed             Past Medical History:  Diagnosis Date   Arthritis    C. difficile diarrhea    age 8s-40s    Chicken pox    Cholecystitis 11/2011   Did not require sgy - Dr. Staci Acosta - [facility]  (cholelithiasis)   H/O Clostridium difficile infection    IBS (irritable bowel syndrome)    MRSA exposure 2005   Spider bite   MVP (mitral valve prolapse)    Stable - Dr. Ubaldo Glassing   Rheumatic fever     Past Surgical History:  Procedure Laterality Date   CATARACT EXTRACTION  [id number]   MUSCLE BIOPSY      There were no vitals filed for this visit.   Subjective Assessment - 01/30/21 1408     Subjective Pt reports that she is doing alright today. Overall her hip pain has been much better. She did have some grabbing pain earlier today in her R low back. She had a fall early in the AM on 01/20/21 and was seen at [facility]. She was found to have a left leg contusion and injury to the R toes 3-5 but no imaging was necessary at the time of the visit. Her LLE pain has improved significantly at this time and no resting pain upon arrival. No specific questions or concerns currently.    Pertinent History Pt reports R posterior and anterior (groin) hip pain which started 6 weeks ago. Pain started suddenly and then gradually  increased but has remained constant since then. She denies any radicular symptoms. Occasionally she has BLE weakness as well which she associates with the hip pain. After the pain started she did have a fall off her mattress onto the floor. She denies injury or any any increase in her pain. Denies any prior history of hip/back pain or surgeries. Her legs occasionally feel heavy but she denies any numbness or tingling in BLE. Lumbar radiographs showed unchanged moderate disc height loss at L4-L5 with degenerative grade 1 anterolisthesis at this level. Moderate lower lumbar predominant facet arthritis. R hip flims showed no significant arthropathy, fracture, or malalignment.  She reports a history of scoliosis and osteopenia and has previously participated in physical therapy with pelvic health therapist for urinary incontinence as well as vestibular therapy for dizziness/imbalance. Patient's husband passed unexpectedly 03/26/20 and she is still trying to cope with the stress from this.  Also trying to get all of the legal matters addressed. Recently saw cardiology and echo shows no significant abnormalities. CXR and stress echo unremarkable. Sleep study negative for sleep apnea.    Diagnostic tests See history    Patient Stated Goals Pain to stop, strengthen legs, feel free to walk  and have energy    Currently in Pain? No/denies              TREATMENT     Ther-ex  NuStep L0-2 x 4 minutes for warm-up during history (2 minutes unbilled); Hooklying lumbar rocking x 1 minute; Hooklying anterior/posterior pelvic tilts 5s hold x 1 minute; Hooklying lateral pelvic tilts 5s hold x 1 minute; Hooklying alternating marching with posterior pelvic tilt x 1 minute; Hooklying SLR with posterior pelvic tilt x 30s BLE; Hooklying clamshells BLE, with blue tband x 1 minute; Hooklying hip adduction ball squeeze x 1 minute;  Seated marches with transverse abdominis contraction x 1 minute; Sit to stand without UE  support  from elevated mat table with blue tband around knees to encouraged hip ER/abduction x 10    Manual Therapy  Single knee to chest stretch x 45s bilateral; Figure 4 stretch x 45s bilateral; FADIR stretch x 45s bilateral; Proximal and distal hamstring stretch x 45s each bilateral; Lumbar rotation stretch x 45s bilateral;     Pt educated throughout session about proper posture and technique with exercises. Improved exercise technique, movement at target joints, use of target muscles after min to mod verbal, visual, tactile cues.      Pt demonstrates excellent motivation during session today. Continued with R hip and low back stretches. Also continued with strengthening for R hip and low back/abdominal muscles which pt is able to perform without reported increase in her pain although she is very fatigued during session today. Will continue to progress strengthening for long-term management of pain. Pt encouraged to follow-up as scheduled. She will benefit from PT services to address deficits in R low back/hip pain in order to return to full function at home with less pain.                               PT Short Term Goals - 12/19/20 1523       PT SHORT TERM GOAL #1   Title Pt will be independent with HEP in order to improve strength and decrease back/hip pain in order to improve pain-free function at home and work.    Time 4    Period Weeks    Status New    Target Date 01/16/21               PT Long Term Goals - 12/19/20 1523       PT LONG TERM GOAL #1   Title Pt will decrease worst back/hip pain as reported on NPRS by at least 3 points in order to demonstrate clinically significant reduction in back pain.    Baseline 12/19/20: worst: 10/10    Time 8    Period Weeks    Status New    Target Date 02/13/21      PT LONG TERM GOAL #2   Title Pt will decrease mODI score by at least 13 points in order demonstrate clinically significant reduction in  back pain/disability.    Baseline 12/19/20: 24%    Time 8    Period Weeks    Status New    Target Date 02/13/21      PT LONG TERM GOAL #3   Title Pt will improve FOTO to at least 58 in order to demonstrate signficant imrpovement in function related to her her low back/hip pain    Baseline 12/19/20: 34    Time 8    Period Weeks  Status New    Target Date 02/13/21      PT LONG TERM GOAL #4   Title Pt will increase strength of bilateral hip abduction and extension by at least 1/2 MMT grade in order to demonstrate improvement in strength and function.    Baseline 12/19/20: 4- bilaterally for both abduction and extension    Time 8    Period Weeks    Status New    Target Date 02/13/21                   Plan - 01/30/21 1430     Clinical Impression Statement Pt demonstrates excellent motivation during session today. Continued with R hip and low back stretches. Also continued with strengthening for R hip and low back/abdominal muscles which pt is able to perform without reported increase in her pain although she is very fatigued during session today. Will continue to progress strengthening for long-term management of pain. Pt encouraged to follow-up as scheduled. She will benefit from PT services to address deficits in R low back/hip pain in order to return to full function at home with less pain.    Personal Factors and Comorbidities Age;Comorbidity 2    Comorbidities OA, IBS    Examination-Activity Limitations Transfers;Stairs    Examination-Participation Restrictions Church;Community Activity;Meal Prep    Stability/Clinical Decision Making Stable/Uncomplicated    Rehab Potential Good    PT Frequency 2x / week    PT Duration 8 weeks    PT Treatment/Interventions ADLs/Self Care Home Management;Aquatic Therapy;Biofeedback;Canalith Repostioning;Cryotherapy;Electrical Stimulation;Iontophoresis 4mg /ml Dexamethasone;Moist Heat;Traction;Ultrasound;Functional mobility  training;Therapeutic activities;Therapeutic exercise;Balance training;Neuromuscular re-education;Manual techniques;Passive range of motion;Dry needling;Vestibular;Visual/perceptual remediation/compensation;Spinal Manipulations;Joint Manipulations    PT Next Visit Plan LE strengthening and manual techniques    PT Home Exercise Plan Access Code: [email or web address]    Consulted and Agree with Plan of Care Patient             Patient will benefit from skilled therapeutic intervention in order to improve the following deficits and impairments:  Pain, Decreased strength  Visit Diagnosis: Pain in right hip  Acute right-sided low back pain, unspecified whether sciatica present     Problem List Patient Active Problem List   Diagnosis Date Noted   Ankle swelling 11/26/2020   Low back pain 11/22/2020   Hip pain, right 11/22/2020   Light headedness 06/24/2020   Hearing loss 06/24/2020   Change in vision 06/24/2020   Stress 05/17/2020   Chest pain 05/16/2020   Welcome to Medicare preventive visit 06/20/2019   Viral syndrome 01/30/2019   Itching 07/21/2017   Asthma 07/16/2016   Urinary incontinence 07/16/2016   SOB (shortness of breath) 04/01/2016   Bilateral shoulder pain 02/24/2016   Fatigue 01/28/2016   Neck fullness 01/28/2016   Routine general medical examination at a health care facility 07/25/2015   Health care maintenance 10/09/2014   Neck pain 11/26/2013   Hypercholesterolemia 11/26/2013   History of colonic polyps 05/11/2013   Hyperbilirubinemia 01/18/2013   GERD (gastroesophageal reflux disease) 12/03/2012   Cholelithiasis 11/07/2012   IBS (irritable bowel syndrome) 11/07/2012   History of rheumatic fever 08/28/2012   MVP (mitral valve prolapse) 08/28/2012   Phillips Grout PT, DPT, GCS  Shamarra Warda 01/30/2021, 2:48 PM  [facility] [address] [place], [place], [address] Phone: [phone]   Fax:  [phone]  Name:  Lynn Mann MRN: [id number] Date of Birth: [birth year]

## 2021-01-31 ENCOUNTER — Emergency Department: Payer: PPO

## 2021-01-31 ENCOUNTER — Encounter: Payer: Self-pay | Admitting: Emergency Medicine

## 2021-01-31 ENCOUNTER — Other Ambulatory Visit: Payer: Self-pay

## 2021-01-31 ENCOUNTER — Emergency Department
Admission: EM | Admit: 2021-01-31 | Discharge: 2021-01-31 | Disposition: A | Discharge status: Home / Self Care | Payer: PPO | Attending: Emergency Medicine | Admitting: Emergency Medicine

## 2021-01-31 ENCOUNTER — Telehealth: Payer: Self-pay | Admitting: Internal Medicine

## 2021-01-31 DIAGNOSIS — R9431 Abnormal electrocardiogram [ECG] [EKG]: Secondary | ICD-10-CM | POA: Diagnosis not present

## 2021-01-31 DIAGNOSIS — J45909 Unspecified asthma, uncomplicated: Secondary | ICD-10-CM | POA: Insufficient documentation

## 2021-01-31 DIAGNOSIS — M542 Cervicalgia: Secondary | ICD-10-CM | POA: Insufficient documentation

## 2021-01-31 DIAGNOSIS — R590 Localized enlarged lymph nodes: Secondary | ICD-10-CM | POA: Diagnosis not present

## 2021-01-31 DIAGNOSIS — Z7982 Long term (current) use of aspirin: Secondary | ICD-10-CM | POA: Insufficient documentation

## 2021-01-31 DIAGNOSIS — M47812 Spondylosis without myelopathy or radiculopathy, cervical region: Secondary | ICD-10-CM | POA: Diagnosis not present

## 2021-01-31 LAB — BASIC METABOLIC PANEL
Anion gap: 7 (ref 5–15)
BUN: 13 mg/dL (ref 8–23)
CO2: 27 mmol/L (ref 22–32)
Calcium: 9.2 mg/dL (ref 8.9–10.3)
Chloride: 106 mmol/L (ref 98–111)
Creatinine, Ser: 0.68 mg/dL (ref 0.44–1.00)
GFR, Estimated: >60 mL/min (ref >60)
Glucose, Bld: 92 mg/dL (ref 70–99)
Potassium: 4.4 mmol/L (ref 3.5–5.1)
Sodium: 140 mmol/L (ref 135–145)

## 2021-01-31 LAB — CBC WITH DIFFERENTIAL/PLATELET
Abs Immature Granulocytes: 0.02 10*3/uL (ref 0.00–0.07)
Basophils Absolute: 0 10*3/uL (ref 0.0–0.1)
Basophils Relative: 0 %
Eosinophils Absolute: 0 10*3/uL (ref 0.0–0.5)
Eosinophils Relative: 0 %
HCT: 39.9 % (ref 36.0–46.0)
Hemoglobin: 13 g/dL (ref 12.0–15.0)
Immature Granulocytes: 0 %
Lymphocytes Relative: 31 %
Lymphs Abs: 2.1 10*3/uL (ref 0.7–4.0)
MCH: 29.7 pg (ref 26.0–34.0)
MCHC: 32.6 g/dL (ref 30.0–36.0)
MCV: 91.1 fL (ref 80.0–100.0)
Monocytes Absolute: 0.5 10*3/uL (ref 0.1–1.0)
Monocytes Relative: 7 %
Neutro Abs: 4.3 10*3/uL (ref 1.7–7.7)
Neutrophils Relative %: 62 %
Platelets: 300 10*3/uL (ref 150–400)
RBC: 4.38 MIL/uL (ref 3.87–5.11)
RDW: 12.9 % (ref 11.5–15.5)
WBC: 6.9 10*3/uL (ref 4.0–10.5)
nRBC: 0 % (ref 0.0–0.2)

## 2021-01-31 IMAGING — CT CT NECK W/ CM
3 of 4 series · 14 of 33 positions shown, 17 images · IV contrast (omnipaque)
Comparison: None.

CLINICAL DATA: Cervical lymphadenopathy.

EXAM:
CT NECK WITH CONTRAST
TECHNIQUE: Multidetector CT imaging of the neck was performed using the
standard protocol following the bolus administration of intravenous
contrast.
CONTRAST:  75mL OMNIPAQUE IOHEXOL 300 MG/ML  SOLN

[Series 5: sag neck · sagittal · 0.54mm/px · 5 of 100 slices shown, 6 images]
[im 34/100  bone]
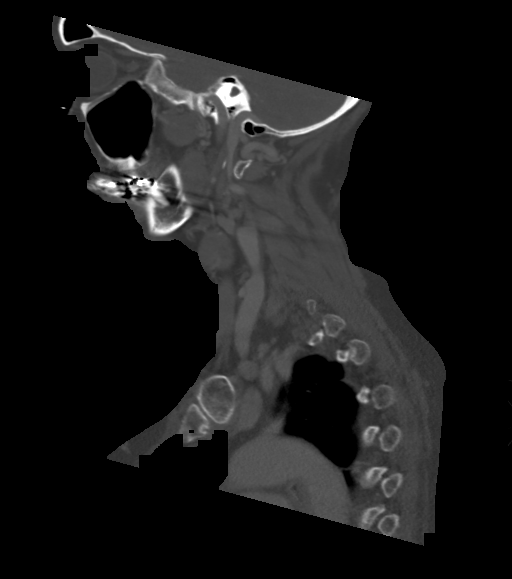
[im 42/100  bone]
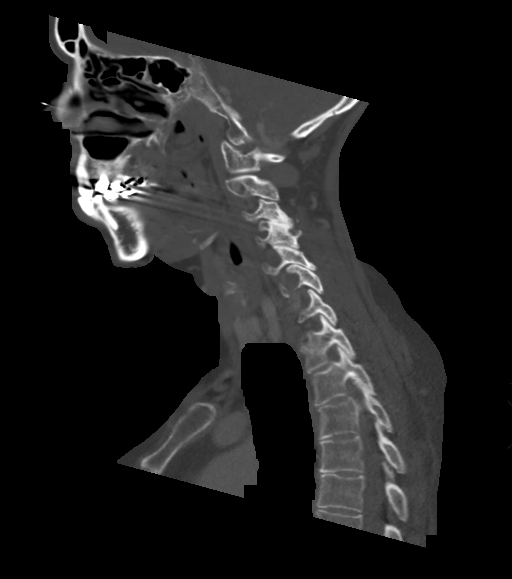
[im 50/100  soft-tissue]
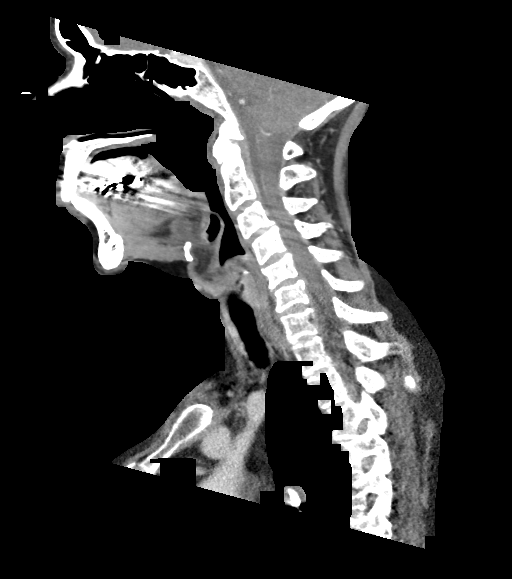
[im 50/100  bone]
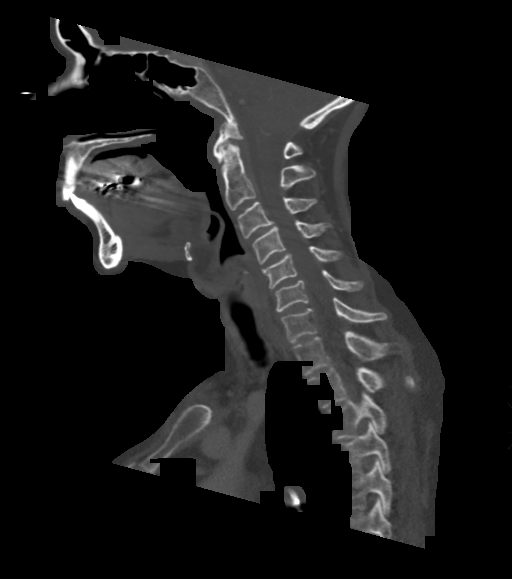
[im 58/100  bone]
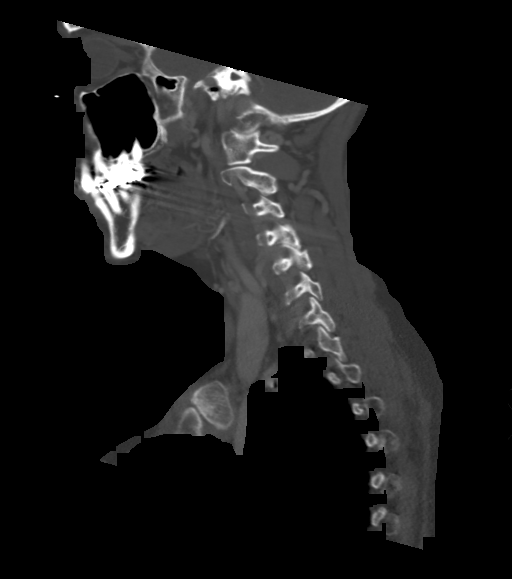
[im 67/100  bone]
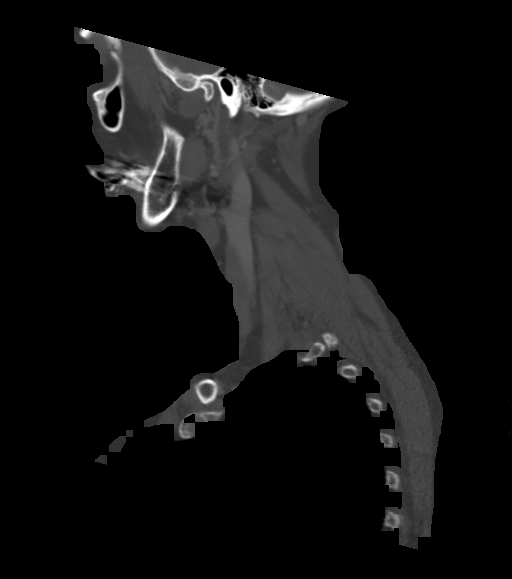

[Series 6: cor neck · coronal · 0.52mm/px · 3 of 118 slices shown]
[im 31/118  bone]
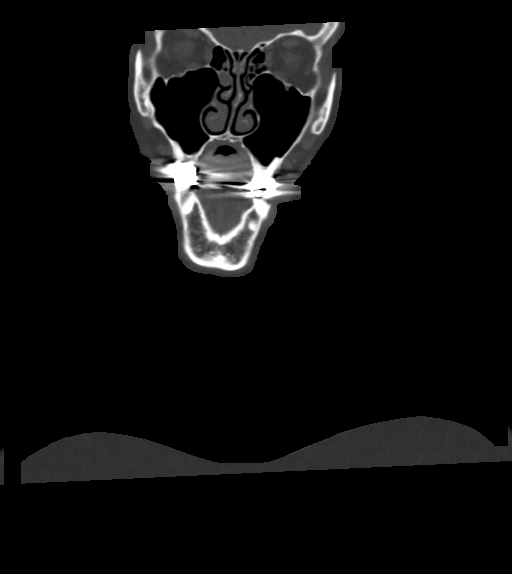
[im 50/118  bone]
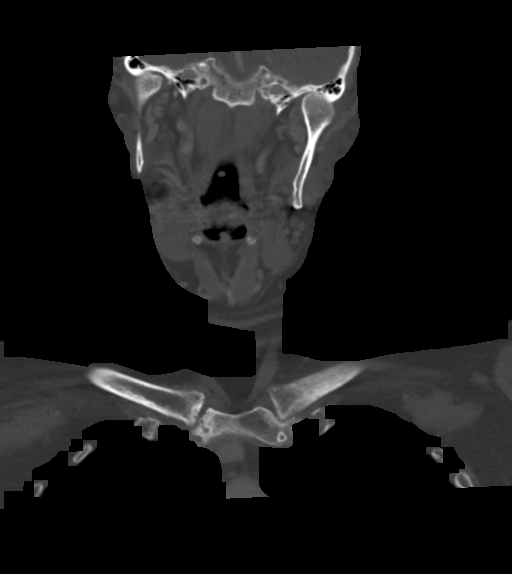
[im 69/118  bone]
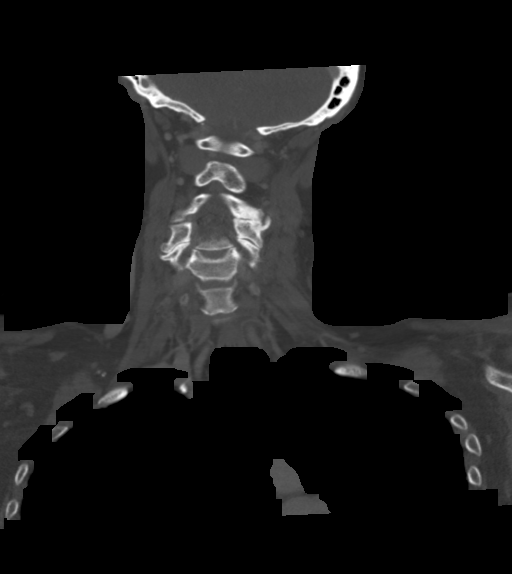

[Series 7: orthogonal ax · axial · 0.50mm/px · z∈[-305,-110]mm · 6 of 142 slices shown, 8 images]
[im 21/142  soft-tissue]
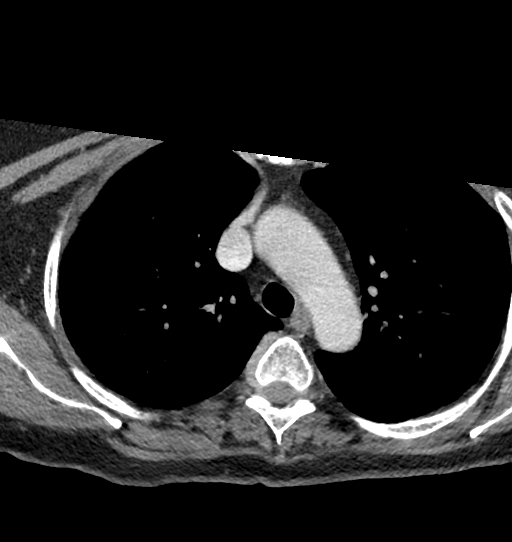
[im 21/142  bone]
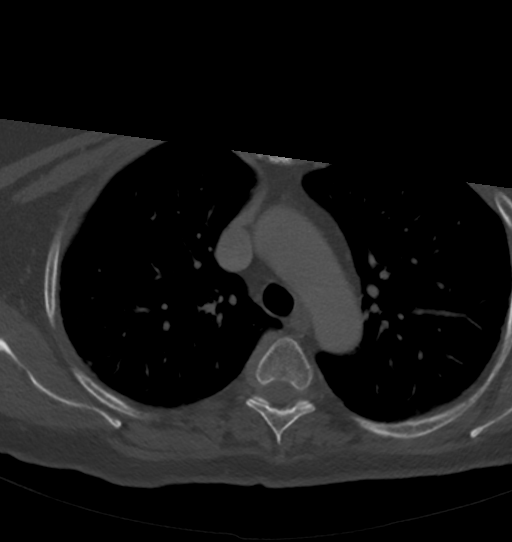
[im 41/142  bone]
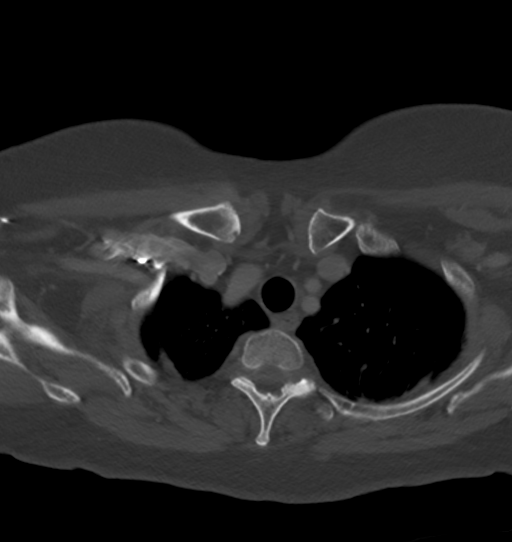
[im 61/142  bone]
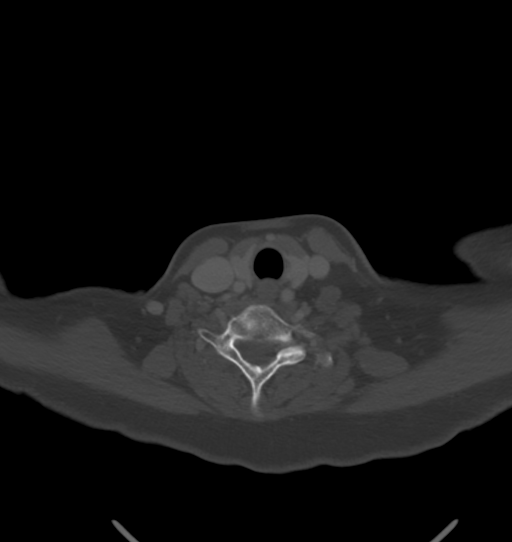
[im 81/142  bone]
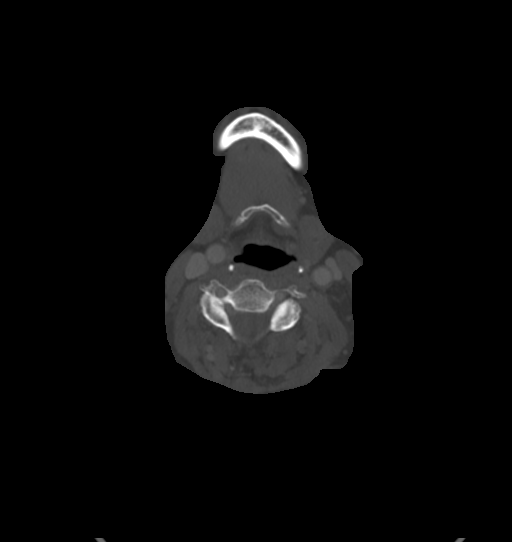
[im 101/142  soft-tissue]
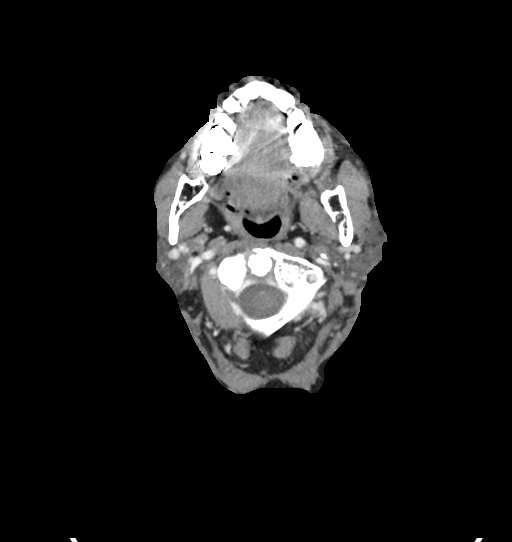
[im 101/142  bone]
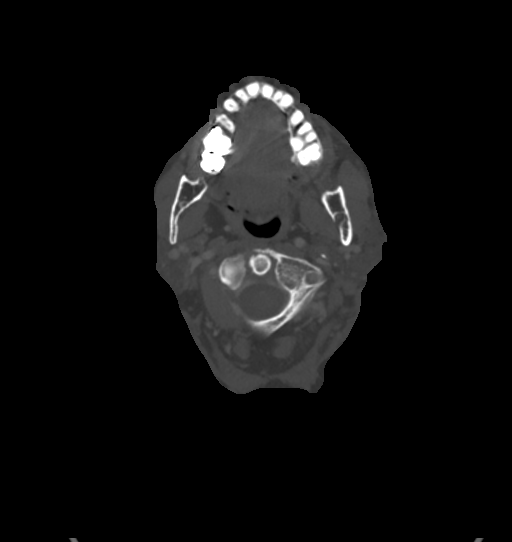
[im 121/142  bone]
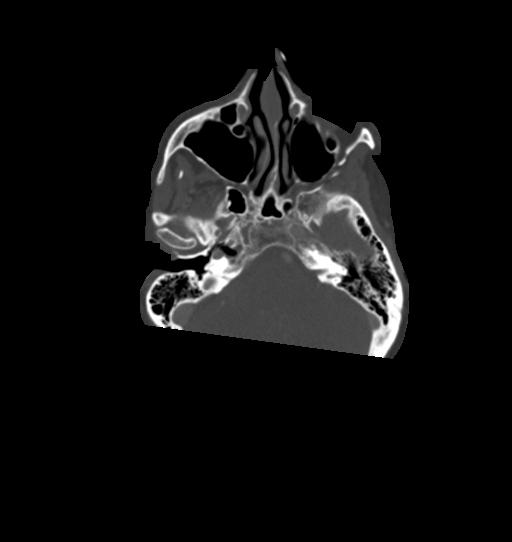

[14 of 33 positions shown; findings below may reference images not displayed]

FINDINGS: PHARYNX AND LARYNX: The nasopharynx, oropharynx and larynx are
normal. Visible portions of the oral cavity, tongue base and floor
of mouth are normal. Normal epiglottis, vallecula and pyriform
sinuses. The larynx is normal. No retropharyngeal abscess, effusion
or lymphadenopathy.

SALIVARY GLANDS: Normal parotid, submandibular and sublingual
glands.

THYROID: Normal.

LYMPH NODES: No enlarged or abnormal density lymph nodes.

VASCULAR: Major cervical vessels are patent.

LIMITED INTRACRANIAL: Normal.

VISUALIZED ORBITS: Normal.

MASTOIDS AND VISUALIZED PARANASAL SINUSES: No fluid levels or
advanced mucosal thickening. No mastoid effusion.

SKELETON: Multilevel facet arthrosis. No bony spinal canal stenosis.

UPPER CHEST: Clear.

OTHER: None.
IMPRESSION: No cervical lymphadenopathy.

## 2021-01-31 IMAGING — CR DG CHEST 2V
2 series · 2 of 2 positions shown · non-contrast
Comparison: [DATE]

CLINICAL DATA: Sudden onset neck pain.

EXAM:
CHEST - 2 VIEW

[chest pa]
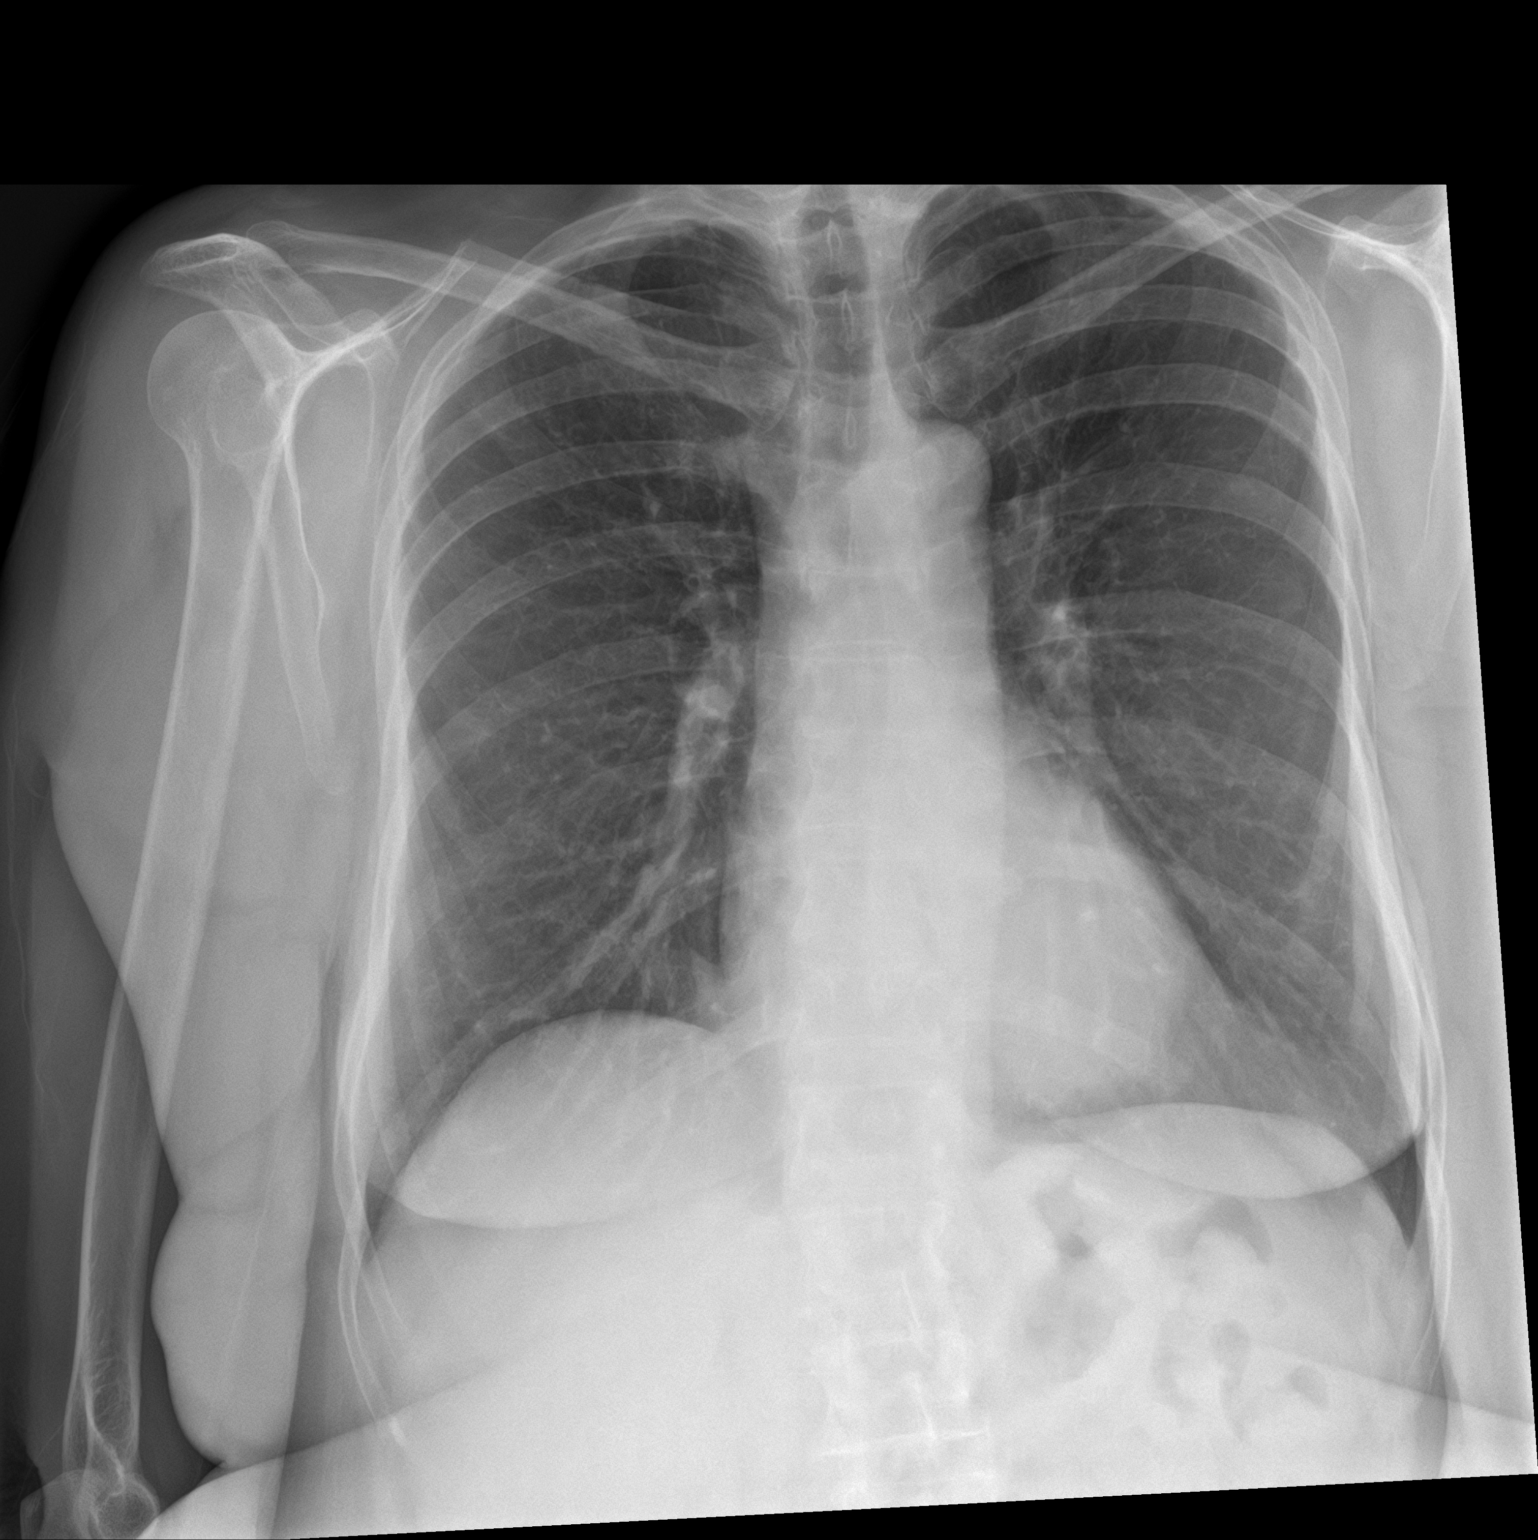

[chest lat]
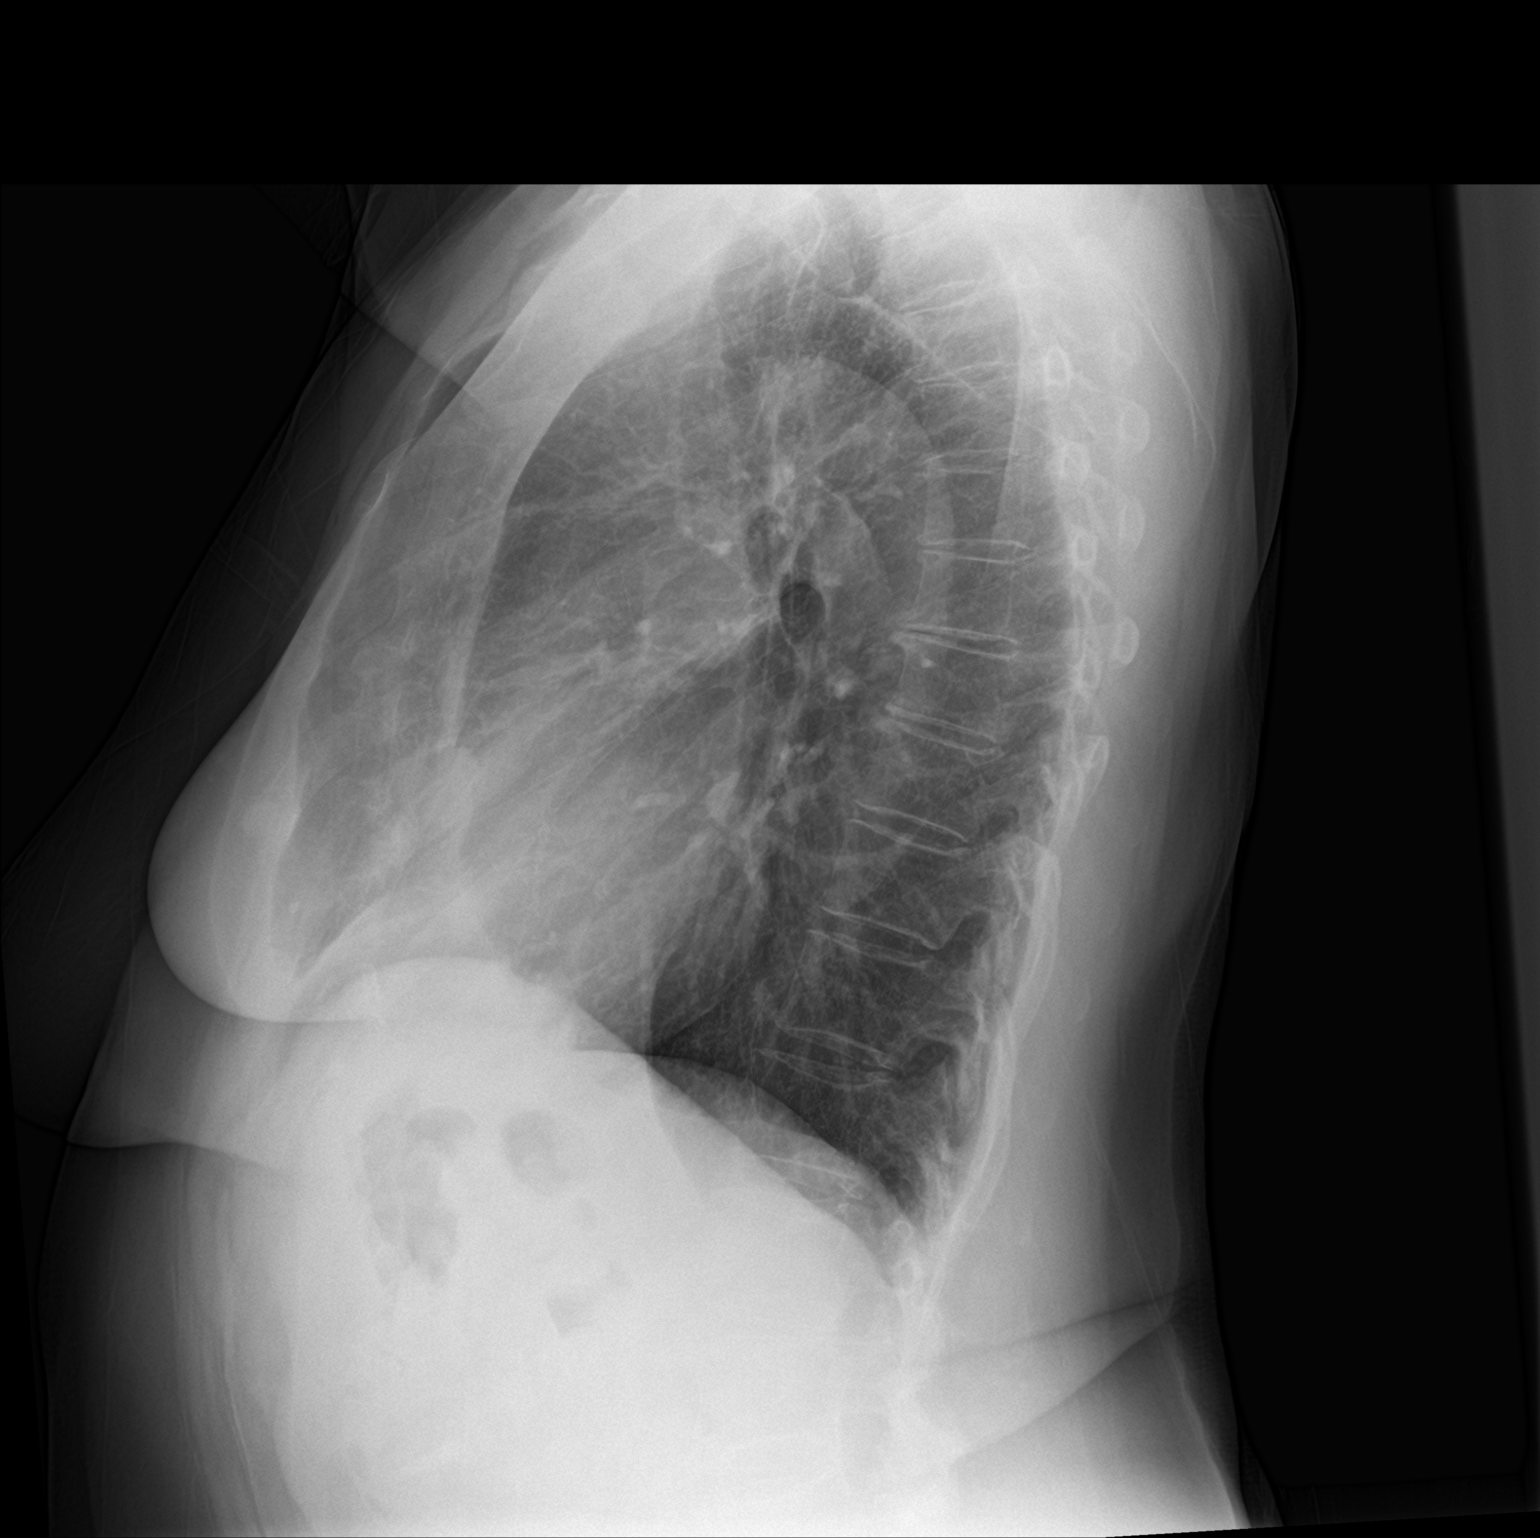

[2 of 2 positions shown; findings below may reference images not displayed]

FINDINGS: The heart size and mediastinal contours are within normal limits.
Both lungs are clear. The visualized skeletal structures are
unremarkable.
IMPRESSION: No active cardiopulmonary disease.

## 2021-01-31 IMAGING — CT CT CERVICAL SPINE W/O CM
3 of 4 series · 12 of 33 positions shown, 14 images · non-contrast
Comparison: Cervical spine radiographs [DATE], cervical CT
[DATE]

CLINICAL DATA: Neck pain.

EXAM:
CT CERVICAL SPINE WITHOUT CONTRAST
TECHNIQUE: Multidetector CT imaging of the cervical spine were reformatted from
contrast enhanced CT of the soft tissue neck. Multiplanar CT image
reconstructions were also generated.

[Series 1: (person_name) (person_name) · axial · 0.35mm/px · z∈[-248,-100]mm · 4 of 112 slices shown, 5 images]
[im 19/112  soft-tissue]
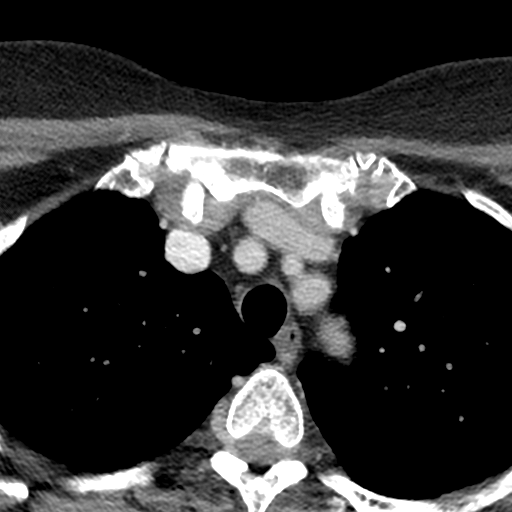
[im 19/112  bone]
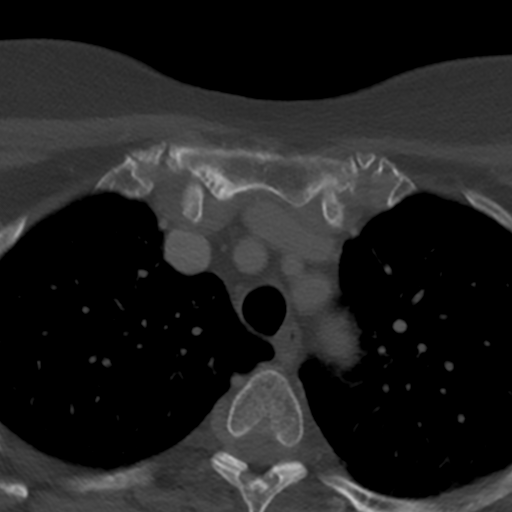
[im 38/112  bone]
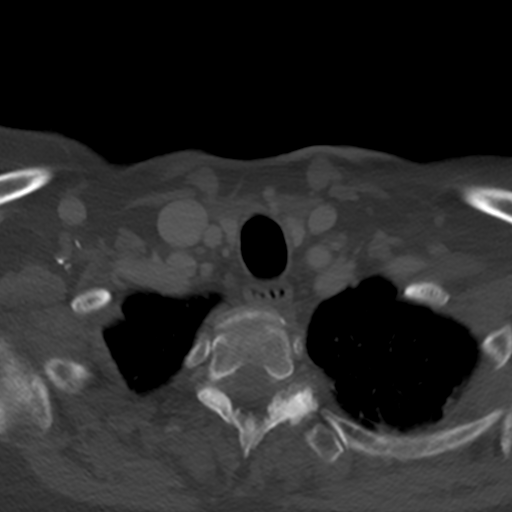
[im 75/112  bone]
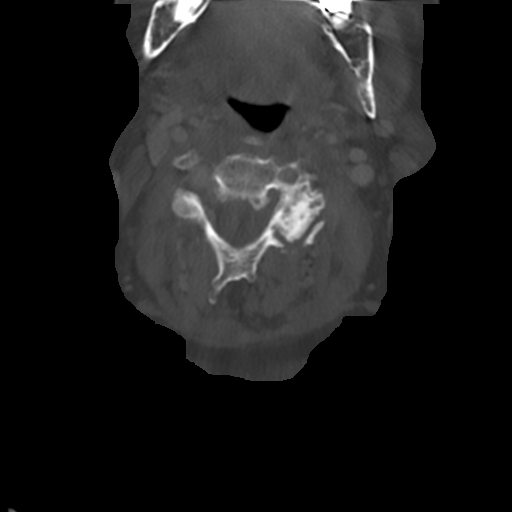
[im 93/112  bone]
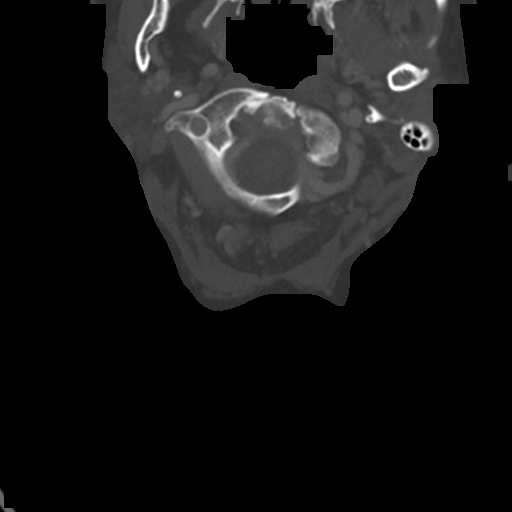

[Series 7: cor c-spine · coronal · 0.33mm/px · 3 of 81 slices shown]
[im 24/81  bone]
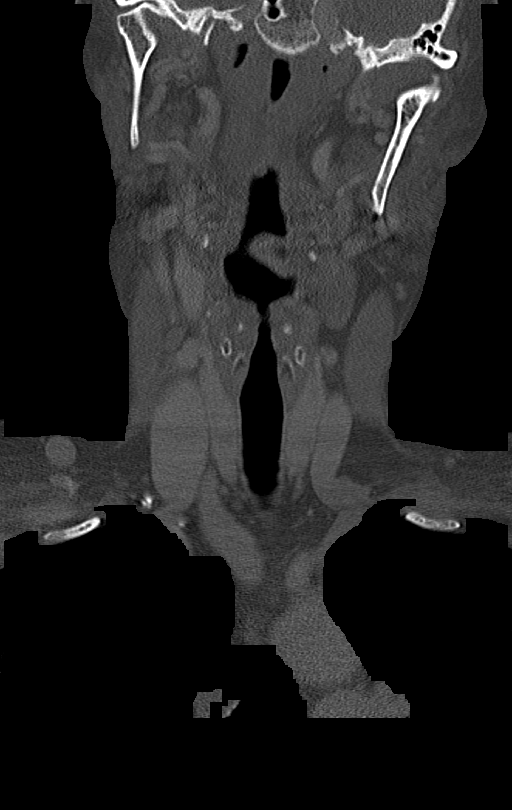
[im 35/81  bone]
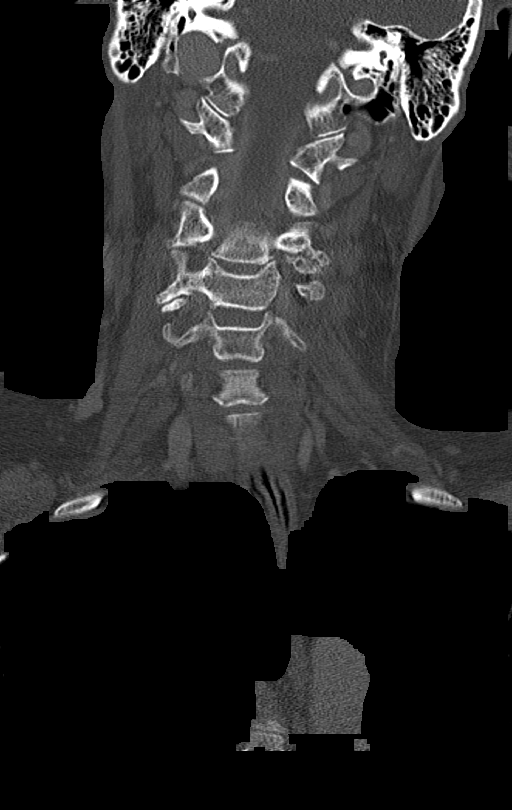
[im 46/81  bone]
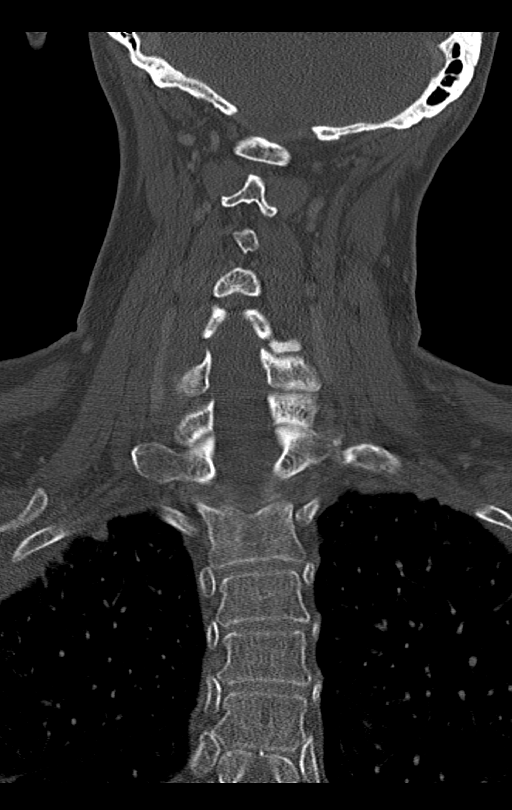

[Series 8: sag c-spine · sagittal · 0.31mm/px · 5 of 86 slices shown, 6 images]
[im 29/86  bone]
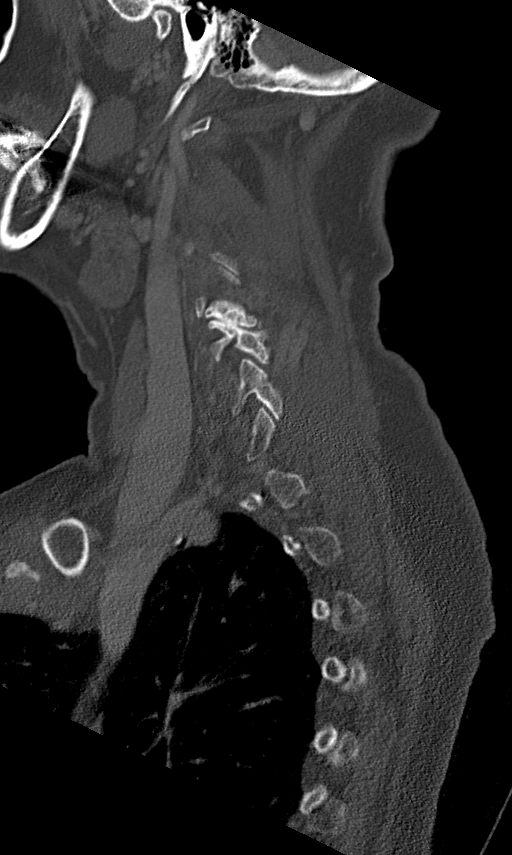
[im 36/86  bone]
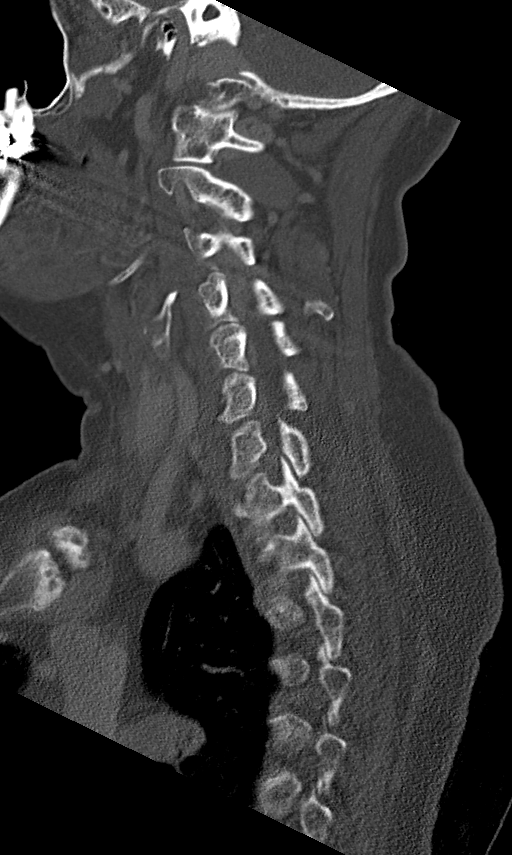
[im 43/86  soft-tissue]
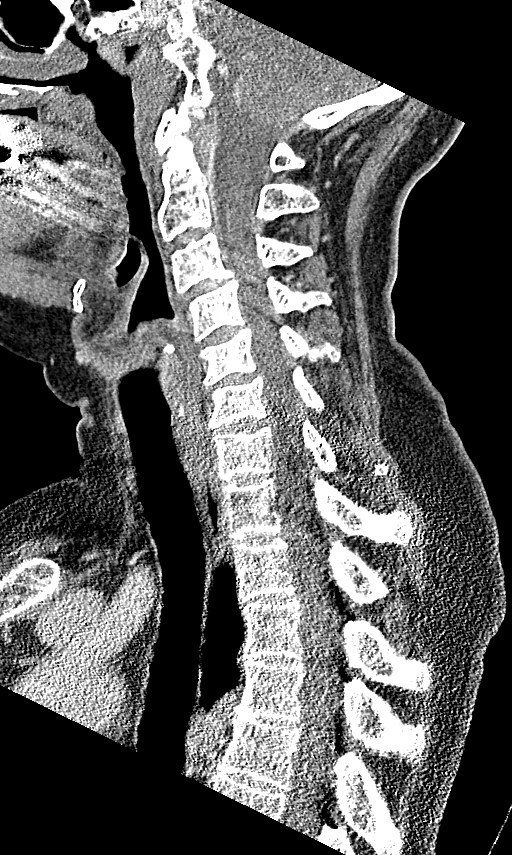
[im 43/86  bone]
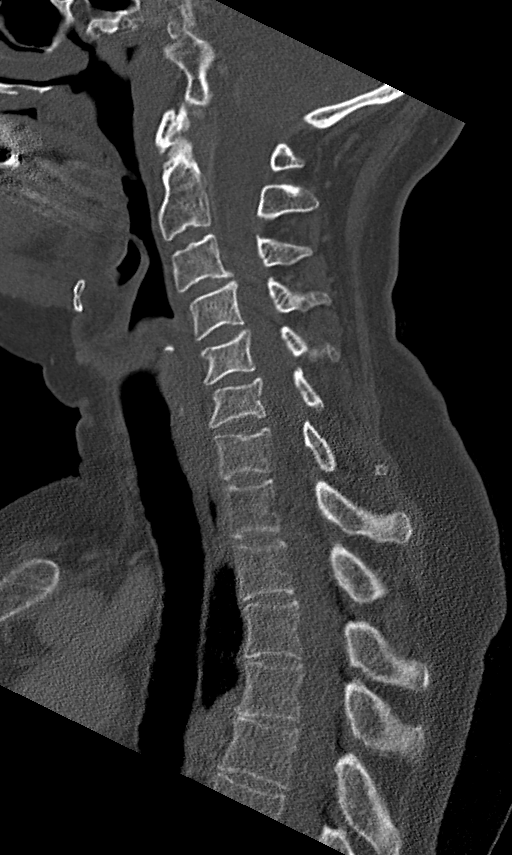
[im 50/86  bone]
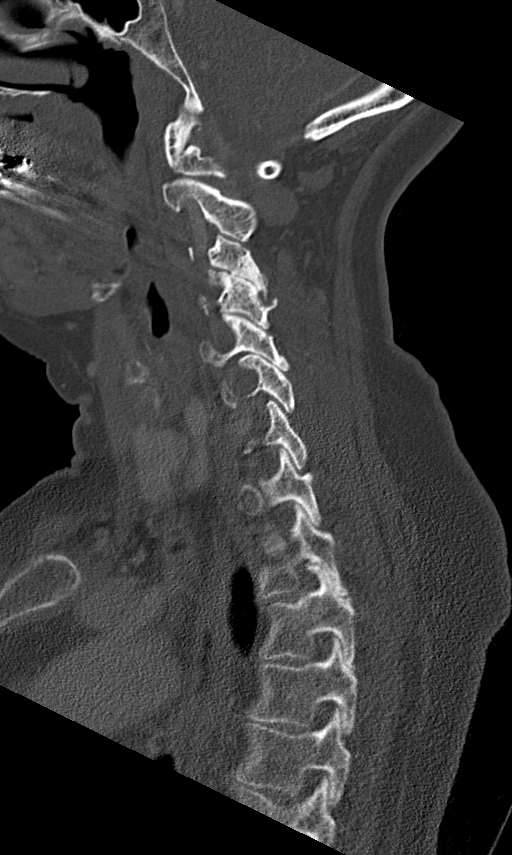
[im 57/86  bone]
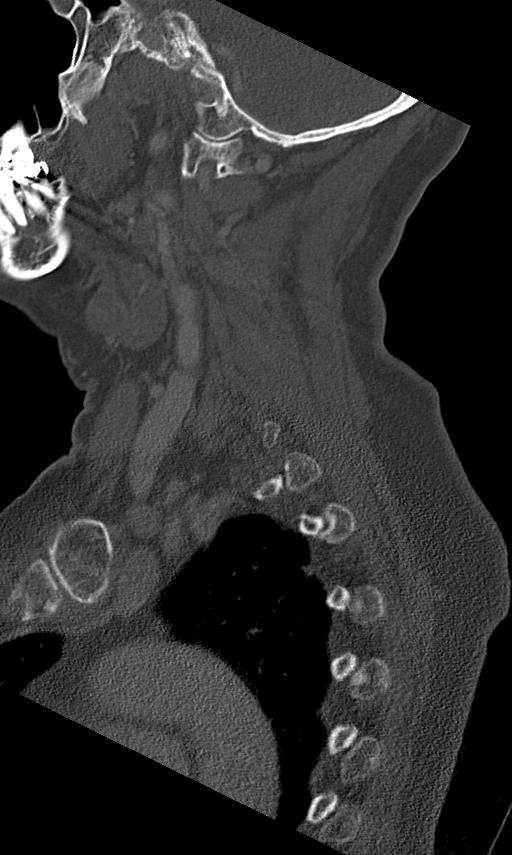

[12 of 33 positions shown; findings below may reference images not displayed]

FINDINGS: Alignment: The straightening of normal cervical lordosis. 2 mm
anterolisthesis of C3 on C4. There is mild broad-based rightward
curvature of the cervical spine.

Skull base and vertebrae: No acute fracture. No primary bone lesion
or focal pathologic process. Questionable os odontoideum.

Soft tissues and spinal canal: Assessed on concurrent neck CT,
reported separately.

Disc levels: Mild endplate spurring at C5-C6 and C6-C7. Moderate
multilevel facet hypertrophy. Multilevel neural foraminal stenosis.

Upper chest: Assessed fully on concurrent neck CT.

Other: None.
IMPRESSION: 1. Mild broad-based rightward curvature of the cervical spine with
multilevel facet hypertrophy. Multilevel neural foraminal stenosis.
2. Chronic straightening of normal lordosis.

## 2021-01-31 MED ORDER — CYCLOBENZAPRINE HCL 5 MG PO TABS
5.0000 mg | ORAL_TABLET | Freq: Three times a day (TID) | ORAL | 0 refills | Status: DC | PRN
Start: 2021-01-31 — End: 2021-09-27

## 2021-01-31 MED ORDER — IOHEXOL 300 MG/ML  SOLN
75.0000 mL | Freq: Once | INTRAMUSCULAR | Status: AC | PRN
  Administered 2021-01-31: 75 mL via INTRAVENOUS
  Filled 2021-01-31: qty 75

## 2021-01-31 NOTE — ED Triage Notes (Signed)
Pt comes into the ED via POV c/o neck pain that started suddenly while the patient was attending grief counseling today.  Pt has h/o cervical bone spurs in the past.  PT states it hurt worse when she would try and turn her head.  Pt is ambulatory to triage with NAD and has full ROM of neck currently.

## 2021-01-31 NOTE — ED Provider Notes (Signed)
[facility] Emergency Department Provider Note ____________________________________________   Event Date/Time   First MD Initiated Contact with Patient 01/31/21 1812     (approximate)  I have reviewed the triage vital signs and the nursing notes.   HISTORY  Chief Complaint Neck Pain    HPI Lynn Mann is a 70 y.o. female with PMH as noted below who presents with neck pain, acute onset this afternoon, occurring mainly in the back of her neck, bilateral, and described as sharp.  It radiated around to the front of her neck.  When it happened she felt like she could not turn her head.  The pain has now mostly resolved.  She also reports that she has some discomfort around her collarbone bilaterally over the last week.  She feels that her neck is slightly swollen but she does not have any shortness of breath or difficulty swallowing.  The patient had a tooth infection a few months ago.  Recently she felt the pain coming back (in the left lower jaw) and started taking penicillin.  She finished this a few days ago and now has no tooth pain.  She denies any fever or chills.  She has no vomiting, shortness of breath, or chest pain.  Past Medical History:  Diagnosis Date   Arthritis    C. difficile diarrhea    age 27s-40s    Chicken pox    Cholecystitis 11/2011   Did not require sgy - Dr. Staci Acosta - [facility]  (cholelithiasis)   H/O Clostridium difficile infection    IBS (irritable bowel syndrome)    MRSA exposure 2005   Spider bite   MVP (mitral valve prolapse)    Stable - Dr. Ubaldo Glassing   Rheumatic fever     Patient Active Problem List   Diagnosis Date Noted   Ankle swelling 11/26/2020   Low back pain 11/22/2020   Hip pain, right 11/22/2020   Light headedness 06/24/2020   Hearing loss 06/24/2020   Change in vision 06/24/2020   Stress 05/17/2020   Chest pain 05/16/2020   Welcome to Medicare preventive visit 06/20/2019   Viral syndrome 01/30/2019   Itching  07/21/2017   Asthma 07/16/2016   Urinary incontinence 07/16/2016   SOB (shortness of breath) 04/01/2016   Bilateral shoulder pain 02/24/2016   Fatigue 01/28/2016   Neck fullness 01/28/2016   Routine general medical examination at a health care facility 07/25/2015   Health care maintenance 10/09/2014   Neck pain 11/26/2013   Hypercholesterolemia 11/26/2013   History of colonic polyps 05/11/2013   Hyperbilirubinemia 01/18/2013   GERD (gastroesophageal reflux disease) 12/03/2012   Cholelithiasis 11/07/2012   IBS (irritable bowel syndrome) 11/07/2012   History of rheumatic fever 08/28/2012   MVP (mitral valve prolapse) 08/28/2012    Past Surgical History:  Procedure Laterality Date   CATARACT EXTRACTION  [id number]   MUSCLE BIOPSY      Prior to Admission medications   Medication Sig Start Date End Date Taking? Authorizing Provider  albuterol (VENTOLIN HFA) 108 (90 Base) MCG/ACT inhaler Inhale 2 puffs into the lungs every 6 (six) hours as needed for wheezing or shortness of breath. 06/26/20   Einar Pheasant, MD  aspirin 325 MG tablet Take by mouth.    [provider]  Multiple Vitamins-Minerals (PRESERVISION AREDS 2 PO) Take by mouth.    [provider]    Allergies Other, Adhesive [tape], Tartrazine, and Yellow dyes (non-tartrazine)  Family History  Problem Relation Age of Onset  Arthritis Mother    Stroke Mother    Diabetes Mother    Hypertension Mother    Arthritis Father    Stroke Father    Diabetes Paternal Grandmother    Cancer Paternal Uncle        colon   Heart disease Other        maternal and paternal side   Breast cancer Paternal 76    Breast cancer Cousin    Breast cancer Cousin        female cousin   Breast cancer Cousin     Social History Social History   Tobacco Use   Smoking status: Never   Smokeless tobacco: Never  Vaping Use   Vaping Use: Never used  Substance Use Topics   Alcohol use: Yes    Alcohol/week: 0.0 standard  drinks    Comment: Rarely   Drug use: No    Review of Systems  Constitutional: No fever/chills Eyes: No visual changes. ENT: No sore throat. Cardiovascular: Denies chest pain. Respiratory: Denies shortness of breath. Gastrointestinal: No vomiting or diarrhea.  Genitourinary: Negative for dysuria.  Musculoskeletal: Negative for back pain. Skin: Negative for rash. Neurological: Negative for headaches, focal weakness or numbness.   ____________________________________________   PHYSICAL EXAM:  VITAL SIGNS: ED Triage Vitals  Enc Vitals Group     BP 01/31/21 1743 125/87     Pulse Rate 01/31/21 1743 89     Resp 01/31/21 1743 18     Temp 01/31/21 1743 98.4 F (36.9 C)     Temp Source 01/31/21 1743 Oral     SpO2 01/31/21 1743 96 %     Weight 01/31/21 1744 179 lb 3.7 oz (81.3 kg)     Height 01/31/21 1744 5\' 7"  (1.702 m)     Head Circumference --      Peak Flow --      Pain Score 01/31/21 1743 3     Pain Loc --      Pain Edu? --      Excl. in [place]? --     Constitutional: Alert and oriented. Well appearing and in no acute distress. Eyes: Conjunctivae are normal.  Head: Atraumatic. Nose: No congestion/rhinnorhea. Mouth/Throat: Mucous membranes are moist.   Neck: Normal range of motion.  Supple.  No cervical spinal tenderness.  Full range of motion.  No significant palpable lymphadenopathy.  No masses, induration, abnormal warmth, or palpable swelling. Cardiovascular: Normal rate, regular rhythm. Good peripheral circulation. Respiratory: Normal respiratory effort.  No retractions.  Gastrointestinal: No distention.  Musculoskeletal: No lower extremity edema.  Extremities warm and well perfused.  Neurologic:  Normal speech and language.  No facial droop.  5/5 motor strength and intact sensation to bilateral upper extremities.  No pronator drift.  No ataxia on finger-to-nose.  Normal gait. Skin:  Skin is warm and dry. No rash noted. Psychiatric: Mood and affect are normal.  Speech and behavior are normal.  ____________________________________________   LABS (all labs ordered are listed, but only abnormal results are displayed)  Labs Reviewed  BASIC METABOLIC PANEL  CBC WITH DIFFERENTIAL/PLATELET   ____________________________________________  EKG  ED ECG REPORT I, Arta Silence, the attending physician, personally viewed and interpreted this ECG.  Date: 01/31/2021 EKG Time: 1917 Rate: 62 Rhythm: normal sinus rhythm QRS Axis: normal Intervals: normal ST/T Wave abnormalities: normal Narrative Interpretation: no evidence of acute ischemia  ____________________________________________  RADIOLOGY  Chest x-ray interpreted by me shows no focal consolidation or edema CT neck soft tissue: Pending CT  cervical spine: Pending  ____________________________________________   PROCEDURES  Procedure(s) performed: No  Procedures  Critical Care performed: No ____________________________________________   INITIAL IMPRESSION / ASSESSMENT AND PLAN / ED COURSE  Pertinent labs & imaging results that were available during my care of the patient were reviewed by me and considered in my medical decision making (see chart for details).   70 year old female with PMH as noted above presents with acute onset of bilateral posterior neck pain which radiated around to the front.  She reports some subjective sensation of swelling in the neck and initially felt like she could not turn her head.  The symptoms have mostly resolved.  She denies any prior episodes like this, although she does report some bone spurs to her cervical spine.  On exam, the patient is overall very well-appearing.  Her vital signs are normal.  The physical exam is unremarkable.  Neurologic exam is normal.  The oropharynx is clear.  The neck is supple with no significant tenderness, normal range of motion, and no palpable swelling or masses.  Overall presentation is most consistent with  muscle spasm, nerve pain, or other benign etiology.  Given the patient's perception of neck swelling we will obtain a CT neck soft tissue as well as a CT of the cervical spine for further evaluation.  However, my clinical suspicion for a neck abscess or mass is low.  The patient does not appear to have any active oral or dental infection.  She also reports some subacute pain around her clavicles bilaterally.  We will obtain a chest x-ray and EKG although this is also consistent with musculoskeletal etiology, and I do not suspect cardiac cause.  ----------------------------------------- 7:31 PM on 01/31/2021 -----------------------------------------  Basic labs, chest x-ray, and EKG are all normal.  The CTs are pending.  I signed the patient out to the oncoming ED physician Dr. Jacqualine Code.  ____________________________________________   FINAL CLINICAL IMPRESSION(S) / ED DIAGNOSES  Final diagnoses:  Neck pain      NEW MEDICATIONS STARTED DURING THIS VISIT:  New Prescriptions   No medications on file     Note:  This document was prepared using Dragon voice recognition software and may include unintentional dictation errors.    Arta Silence, MD 01/31/21 [phone]

## 2021-01-31 NOTE — Telephone Encounter (Signed)
Transfer to Access Nurse    PT called to state they have trouble turning their head. They stated their glands are swollen which is causing pain when turning their neck. They also have pain going up from their neck when they try and their collarbone has been hurting for the last 2 weeks. Advise the Access Nurse nothing available for rest of week.

## 2021-01-31 NOTE — ED Notes (Signed)
See triage note  Presents with neck pain and headache  States she developed this pain while doing a zoom visit    And started suddenly  States she is feeling a little better but also has had a fall  Having some discomfort to upper chest and back

## 2021-01-31 NOTE — Telephone Encounter (Signed)
Received a call from [employer] from [employer] production planner. Amber states that [employer] started to feel neck pain and was having trouble turning her head starting today. She is having difficulties putting her chin to her chest. She declines going to an Urgent care or ED and said that she has to be seen at our office. Amber states that Nalda is currently driving to our office to have an appointment with Dr. Nicki Reaper.  Called and spoke to [place]. Chelisa states that she was having some grief counseling through zoom and is now having pain in her neck and is having trouble turning her neck. Pt states that she has a tooth abscess but is treating with Penicillin. Lorilynn states that she is having some neck swelling and trouble with her collar bone and does not know if it is related. Upon calling Addisynn, Vassell states that she is driving and has arrived at our office at 4:09pm. She states that she was told by Dr.Scott previously that she could see Dr. Nicki Reaper whenever if she comes to the office. Dr. Nicki Reaper was on a half day and schedule ended at 1pm. Dr. Olivia Mackie had no availabilities and last patient was at 3:30pm. Dr. Derrel Nip had no availabilities. Novalee was informed that there was no availabilities in office and was instructed to go to [facility] before closing for the sudden neck pain. Joreen verbalized understanding and stated that she would go to [facility] urgent care for neck pain.

## 2021-01-31 NOTE — ED Provider Notes (Signed)
   Patient seen by partner Dr. Cherylann Banas. Following up on CT soft tissue neck.  If negative plan to discharge with prescription for Flexeril as provided by Dr. Darci Current Chest 2 View  Result Date: 01/31/2021 CLINICAL DATA:  Sudden onset neck pain. EXAM: CHEST - 2 VIEW COMPARISON:  05/16/2020 FINDINGS: The heart size and mediastinal contours are within normal limits. Both lungs are clear. The visualized skeletal structures are unremarkable. IMPRESSION: No active cardiopulmonary disease. Electronically Signed   By: Lucienne Capers M.D.   On: 01/31/2021 19:02   CT Soft Tissue Neck W Contrast  Result Date: 01/31/2021 CLINICAL DATA:  Cervical lymphadenopathy. EXAM: CT NECK WITH CONTRAST TECHNIQUE: Multidetector CT imaging of the neck was performed using the standard protocol following the bolus administration of intravenous contrast. CONTRAST:  67mL OMNIPAQUE IOHEXOL 300 MG/ML  SOLN COMPARISON:  None. FINDINGS: PHARYNX AND LARYNX: The nasopharynx, oropharynx and larynx are normal. Visible portions of the oral cavity, tongue base and floor of mouth are normal. Normal epiglottis, vallecula and pyriform sinuses. The larynx is normal. No retropharyngeal abscess, effusion or lymphadenopathy. SALIVARY GLANDS: Normal parotid, submandibular and sublingual glands. THYROID: Normal. LYMPH NODES: No enlarged or abnormal density lymph nodes. VASCULAR: Major cervical vessels are patent. LIMITED INTRACRANIAL: Normal. VISUALIZED ORBITS: Normal. MASTOIDS AND VISUALIZED PARANASAL SINUSES: No fluid levels or advanced mucosal thickening. No mastoid effusion. SKELETON: Multilevel facet arthrosis. No bony spinal canal stenosis. UPPER CHEST: Clear. OTHER: None. IMPRESSION: No cervical lymphadenopathy. Electronically Signed   By: Ulyses Jarred M.D.   On: 01/31/2021 20:18   CT C-SPINE NO CHARGE  Result Date: 01/31/2021 CLINICAL DATA:  Neck pain. EXAM: CT CERVICAL SPINE WITHOUT CONTRAST TECHNIQUE: Multidetector CT imaging of the  cervical spine were reformatted from contrast enhanced CT of the soft tissue neck. Multiplanar CT image reconstructions were also generated. COMPARISON:  Cervical spine radiographs 03/06/2020, cervical CT 03/01/2016 FINDINGS: Alignment: The straightening of normal cervical lordosis. 2 mm anterolisthesis of C3 on C4. There is mild broad-based rightward curvature of the cervical spine. Skull base and vertebrae: No acute fracture. No primary bone lesion or focal pathologic process. Questionable os odontoideum. Soft tissues and spinal canal: Assessed on concurrent neck CT, reported separately. Disc levels: Mild endplate spurring at V7-B9 and C6-C7. Moderate multilevel facet hypertrophy. Multilevel neural foraminal stenosis. Upper chest: Assessed fully on concurrent neck CT. Other: None. IMPRESSION: 1. Mild broad-based rightward curvature of the cervical spine with multilevel facet hypertrophy. Multilevel neural foraminal stenosis. 2. Chronic straightening of normal lordosis. Electronically Signed   By: Keith Rake M.D.   On: 01/31/2021 20:06     CT imaging reassuring.  Neuroforaminal stenosis noted but no other acute findings.  Patient resting comfortably reports she feels a lot better the pain is not present any longer.  Fully awake and alert.  Return precautions and treatment recommendations and follow-up discussed with the patient who is agreeable with the plan.    Delman Kitten, MD 01/31/21 2034

## 2021-02-01 ENCOUNTER — Ambulatory Visit: Payer: PPO

## 2021-02-01 DIAGNOSIS — M25551 Pain in right hip: Secondary | ICD-10-CM

## 2021-02-01 DIAGNOSIS — M545 Low back pain, unspecified: Secondary | ICD-10-CM

## 2021-02-01 NOTE — Therapy (Signed)
[facility] [address] [place], [place], [address] Phone: [phone]   Fax:  [phone]  Physical Therapy Treatment  Patient Details  Name: Lynn Mann MRN: [id number] Date of Birth: Oct 25, 1950 Referring Provider (PT): Einar Pheasant   Encounter Date: 02/01/2021   PT End of Session - 02/01/21 1423     Visit Number 9    Number of Visits 17    Date for PT Re-Evaluation 02/13/21    Authorization Type eval: 12/19/20    PT Start Time 1410    PT Stop Time 1445    PT Time Calculation (min) 35 min    Activity Tolerance Patient tolerated treatment well    Behavior During Therapy [facility] for tasks assessed/performed              Past Medical History:  Diagnosis Date   Arthritis    C. difficile diarrhea    age 2s-40s    Chicken pox    Cholecystitis 11/2011   Did not require sgy - Dr. Staci Acosta - [facility]  (cholelithiasis)   H/O Clostridium difficile infection    IBS (irritable bowel syndrome)    MRSA exposure 2005   Spider bite   MVP (mitral valve prolapse)    Stable - Dr. Ubaldo Glassing   Rheumatic fever     Past Surgical History:  Procedure Laterality Date   CATARACT EXTRACTION  [id number]   MUSCLE BIOPSY      There were no vitals filed for this visit.   Subjective Assessment - 02/01/21 1413     Subjective Pt reports that she is doing alright today. No back pain upon arrival. She went to the ER yesterday for severe posterior neck and posterior scalp pain.  She underwent cervical CT as well as soft tissue neck CT both of which showed no acute concerns. She rates her bilateral clavicle pain as 4/10 upon arrival currently and her posterior neck/scalp pain as 2/10. No specific questions or concerns currently.    Pertinent History Pt reports R posterior and anterior (groin) hip pain which started 6 weeks ago. Pain started suddenly and then gradually increased but has remained constant since then. She denies any radicular symptoms.  Occasionally she has BLE weakness as well which she associates with the hip pain. After the pain started she did have a fall off her mattress onto the floor. She denies injury or any any increase in her pain. Denies any prior history of hip/back pain or surgeries. Her legs occasionally feel heavy but she denies any numbness or tingling in BLE. Lumbar radiographs showed unchanged moderate disc height loss at L4-L5 with degenerative grade 1 anterolisthesis at this level. Moderate lower lumbar predominant facet arthritis. R hip flims showed no significant arthropathy, fracture, or malalignment.  She reports a history of scoliosis and osteopenia and has previously participated in physical therapy with pelvic health therapist for urinary incontinence as well as vestibular therapy for dizziness/imbalance. Patient's husband passed unexpectedly 03/26/20 and she is still trying to cope with the stress from this.  Also trying to get all of the legal matters addressed. Recently saw cardiology and echo shows no significant abnormalities. CXR and stress echo unremarkable. Sleep study negative for sleep apnea.    Diagnostic tests See history    Patient Stated Goals Pain to stop, strengthen legs, feel free to walk and have energy                TREATMENT  Ther-ex  NuStep L0-2 x 5 minutes for warm-up during history with therapist adjusting resistance; Hooklying lumbar rocking x 1 minute; Hooklying anterior/posterior pelvic tilts 5s hold x 1 minute; Hooklying lateral pelvic tilts 5s hold x 1 minute; Hooklying alternating marching with posterior pelvic tilt 2 x 1 minute, attempted SLR but pt reports straining with pain in back of neck during attempts; Hooklying clamshells BLE, with blue tband 2 x 30s; Hooklying hip adduction ball squeeze 2 x 30s; Sidelying straight leg hip abduction 2 x 10 BLE; Sidelying reverse clams with manual resistance 2 x 10;     Pt educated throughout session about proper posture  and technique with exercises. Improved exercise technique, movement at target joints, use of target muscles after min to mod verbal, visual, tactile cues.      Pt demonstrates excellent motivation during session today.  She arrived late for appointment so session was abbreviated accordingly. Continued with R hip and low back strengthening today. Pt reports intermittent increase in neck pain so modified and provided cues to exercises to minimize. Also provided additional neck support on table.  She will need updated outcome measures, goals, and a progress note at next visit.  Will discuss plans for additional therapy versus discharge.  Will continue to progress strengthening for long-term management of pain. Pt encouraged to follow-up as scheduled. She will benefit from PT services to address deficits in R low back/hip pain in order to return to full function at home with less pain.                               PT Short Term Goals - 12/19/20 1523       PT SHORT TERM GOAL #1   Title Pt will be independent with HEP in order to improve strength and decrease back/hip pain in order to improve pain-free function at home and work.    Time 4    Period Weeks    Status New    Target Date 01/16/21               PT Long Term Goals - 12/19/20 1523       PT LONG TERM GOAL #1   Title Pt will decrease worst back/hip pain as reported on NPRS by at least 3 points in order to demonstrate clinically significant reduction in back pain.    Baseline 12/19/20: worst: 10/10    Time 8    Period Weeks    Status New    Target Date 02/13/21      PT LONG TERM GOAL #2   Title Pt will decrease mODI score by at least 13 points in order demonstrate clinically significant reduction in back pain/disability.    Baseline 12/19/20: 24%    Time 8    Period Weeks    Status New    Target Date 02/13/21      PT LONG TERM GOAL #3   Title Pt will improve FOTO to at least 58 in order to demonstrate  signficant imrpovement in function related to her her low back/hip pain    Baseline 12/19/20: 34    Time 8    Period Weeks    Status New    Target Date 02/13/21      PT LONG TERM GOAL #4   Title Pt will increase strength of bilateral hip abduction and extension by at least 1/2 MMT grade in order to demonstrate improvement in strength  and function.    Baseline 12/19/20: 4- bilaterally for both abduction and extension    Time 8    Period Weeks    Status New    Target Date 02/13/21                   Plan - 02/01/21 1424     Clinical Impression Statement Pt demonstrates excellent motivation during session today.  She arrived late for appointment so session was abbreviated accordingly. Continued with R hip and low back strengthening today. Pt reports intermittent increase in neck pain so modified and provided cues to exercises to minimize. Also provided additional neck support on table.  She will need updated outcome measures, goals, and a progress note at next visit.  Will discuss plans for additional therapy versus discharge.  Will continue to progress strengthening for long-term management of pain. Pt encouraged to follow-up as scheduled. She will benefit from PT services to address deficits in R low back/hip pain in order to return to full function at home with less pain.    Personal Factors and Comorbidities Age;Comorbidity 2    Comorbidities OA, IBS    Examination-Activity Limitations Transfers;Stairs    Examination-Participation Restrictions Church;Community Activity;Meal Prep    Stability/Clinical Decision Making Stable/Uncomplicated    Rehab Potential Good    PT Frequency 2x / week    PT Duration 8 weeks    PT Treatment/Interventions ADLs/Self Care Home Management;Aquatic Therapy;Biofeedback;Canalith Repostioning;Cryotherapy;Electrical Stimulation;Iontophoresis 4mg /ml Dexamethasone;Moist Heat;Traction;Ultrasound;Functional mobility training;Therapeutic activities;Therapeutic  exercise;Balance training;Neuromuscular re-education;Manual techniques;Passive range of motion;Dry needling;Vestibular;Visual/perceptual remediation/compensation;Spinal Manipulations;Joint Manipulations    PT Next Visit Plan LE strengthening and manual techniques    PT Home Exercise Plan Access Code: [email or web address]    Consulted and Agree with Plan of Care Patient              Patient will benefit from skilled therapeutic intervention in order to improve the following deficits and impairments:  Pain, Decreased strength  Visit Diagnosis: Pain in right hip  Acute right-sided low back pain, unspecified whether sciatica present     Problem List Patient Active Problem List   Diagnosis Date Noted   Ankle swelling 11/26/2020   Low back pain 11/22/2020   Hip pain, right 11/22/2020   Light headedness 06/24/2020   Hearing loss 06/24/2020   Change in vision 06/24/2020   Stress 05/17/2020   Chest pain 05/16/2020   Welcome to Medicare preventive visit 06/20/2019   Viral syndrome 01/30/2019   Itching 07/21/2017   Asthma 07/16/2016   Urinary incontinence 07/16/2016   SOB (shortness of breath) 04/01/2016   Bilateral shoulder pain 02/24/2016   Fatigue 01/28/2016   Neck fullness 01/28/2016   Routine general medical examination at a health care facility 07/25/2015   Health care maintenance 10/09/2014   Neck pain 11/26/2013   Hypercholesterolemia 11/26/2013   History of colonic polyps 05/11/2013   Hyperbilirubinemia 01/18/2013   GERD (gastroesophageal reflux disease) 12/03/2012   Cholelithiasis 11/07/2012   IBS (irritable bowel syndrome) 11/07/2012   History of rheumatic fever 08/28/2012   MVP (mitral valve prolapse) 08/28/2012   Phillips Grout PT, DPT, GCS  Algernon Mundie 02/01/2021, 4:12 PM  [facility] [address] [place], [place], [address] Phone: [phone]   Fax:  [phone]  Name: Lynn Mann MRN: [id number] Date of  Birth: Jan 20, 1951

## 2021-02-01 NOTE — Telephone Encounter (Signed)
Reviewed. Pt seen and evaluated.  Neck scan.  Per note, feeling better prior to discharge.

## 2021-02-01 NOTE — Telephone Encounter (Signed)
Providing access nurse documentation.      

## 2021-02-02 ENCOUNTER — Telehealth: Payer: Self-pay | Admitting: Internal Medicine

## 2021-02-02 NOTE — Telephone Encounter (Signed)
PT wanted to call and advise Dr.Scott that she had a non fasting basic metabolic panel done at the hospital.

## 2021-02-06 ENCOUNTER — Other Ambulatory Visit: Payer: Self-pay

## 2021-02-06 ENCOUNTER — Ambulatory Visit: Payer: PPO

## 2021-02-06 ENCOUNTER — Telehealth: Payer: Self-pay | Admitting: Internal Medicine

## 2021-02-06 VITALS — BP 129/68 | HR 86

## 2021-02-06 DIAGNOSIS — M25551 Pain in right hip: Secondary | ICD-10-CM

## 2021-02-06 DIAGNOSIS — M545 Low back pain, unspecified: Secondary | ICD-10-CM

## 2021-02-06 NOTE — Telephone Encounter (Signed)
Patient called and wanted Dr Nicki Reaper to know she was at urgent care at the end of June and the ER in the being of July. Patient has an appointment on 02/09/21 and patient wanted Dr Nicki Reaper to be aware.

## 2021-02-06 NOTE — Telephone Encounter (Signed)
See her 02/09/21

## 2021-02-06 NOTE — Therapy (Signed)
[facility] [address] [place], [place], [address] Phone: [phone]   Fax:  [phone]  Physical Therapy Progress Note  Dates of reporting period  12/19/20   to   02/06/21  Patient Details  Name: Lynn Mann MRN: [id number] Date of Birth: 13-Mar-1951 Referring Provider (PT): Einar Pheasant   Encounter Date: 02/06/2021   PT End of Session - 02/06/21 1614     Visit Number 10    Number of Visits 17    Date for PT Re-Evaluation 02/13/21    Authorization Type eval: 12/19/20    PT Start Time 1615    PT Stop Time 1700    PT Time Calculation (min) 45 min    Activity Tolerance Patient tolerated treatment well    Behavior During Therapy [facility] for tasks assessed/performed              Past Medical History:  Diagnosis Date   Arthritis    C. difficile diarrhea    age 60s-40s    Chicken pox    Cholecystitis 11/2011   Did not require sgy - Dr. Staci Acosta - [facility]  (cholelithiasis)   H/O Clostridium difficile infection    IBS (irritable bowel syndrome)    MRSA exposure 2005   Spider bite   MVP (mitral valve prolapse)    Stable - Dr. Ubaldo Glassing   Rheumatic fever     Past Surgical History:  Procedure Laterality Date   CATARACT EXTRACTION  [id number]   MUSCLE BIOPSY      Vitals:   02/06/21 1632  BP: 129/68  Pulse: 86  SpO2: 96%     Subjective Assessment - 02/06/21 1613     Subjective Pt reports that she is doing alright today. She reports that she has been very fatigued the last couple days. She tried not taking her muscle relaxers today thinking that might be contributing to her fatigue. No back pain upon arrival but she continues having neck pain. No specific questions or concerns currently.    Pertinent History Pt reports R posterior and anterior (groin) hip pain which started 6 weeks ago. Pain started suddenly and then gradually increased but has remained constant since then. She denies any radicular symptoms. Occasionally  she has BLE weakness as well which she associates with the hip pain. After the pain started she did have a fall off her mattress onto the floor. She denies injury or any any increase in her pain. Denies any prior history of hip/back pain or surgeries. Her legs occasionally feel heavy but she denies any numbness or tingling in BLE. Lumbar radiographs showed unchanged moderate disc height loss at L4-L5 with degenerative grade 1 anterolisthesis at this level. Moderate lower lumbar predominant facet arthritis. R hip flims showed no significant arthropathy, fracture, or malalignment.  She reports a history of scoliosis and osteopenia and has previously participated in physical therapy with pelvic health therapist for urinary incontinence as well as vestibular therapy for dizziness/imbalance. Patient's husband passed unexpectedly 03/26/20 and she is still trying to cope with the stress from this.  Also trying to get all of the legal matters addressed. Recently saw cardiology and echo shows no significant abnormalities. CXR and stress echo unremarkable. Sleep study negative for sleep apnea.    Diagnostic tests See history    Patient Stated Goals Pain to stop, strengthen legs, feel free to walk and have energy  TREATMENT     Ther-ex  Updated outcome measures with patient: mODI: 16% FOTO: 55 Worst pain: 4/10 (decreased frequency and duration); Improvement: 75% improved; Hip strength: R/L abduction: 4/4 extension: 4-/4-  NuStep L0-1 x 5 minutes for warm-up during history with therapist adjusting resistance;  Sidelying straight leg hip abduction 2 x 10 BLE; Sidelying clams with manual resistance from therapist 2 x 10 BLE; Sidelying reverse clams 2 x 10 BLE;     Pt educated throughout session about proper posture and technique with exercises. Improved exercise technique, movement at target joints, use of target muscles after min to mod verbal, visual, tactile cues.      Pt  demonstrates excellent motivation during session today. Updated outcome measures and goals with patient. Her mODI decreased to 16% from the initial evaluation and her FOTO increased to 55 today. She reports approximately 75% improvement in her symptoms since starting therapy. Her worst pain is now 4/10 and she also note decreased frequency and duration of her pain when it occurs. Continued with R hip and low back strengthening today. Will continue to progress strengthening for long-term management of pain. Pt encouraged to follow-up as scheduled. She will benefit from PT services to address deficits in R low back/hip pain in order to return to full function at home with less pain.                               PT Short Term Goals - 02/06/21 1614       PT SHORT TERM GOAL #1   Title Pt will be independent with HEP in order to improve strength and decrease back/hip pain in order to improve pain-free function at home and work.    Time 4    Period Weeks    Status On-going    Target Date 01/16/21               PT Long Term Goals - 02/06/21 1615       PT LONG TERM GOAL #1   Title Pt will decrease worst back/hip pain as reported on NPRS by at least 3 points in order to demonstrate clinically significant reduction in back pain.    Baseline 12/19/20: worst: 10/10; 02/06/21: 4/10 (decreased frequency and duration)    Time 8    Period Weeks    Status Achieved      PT LONG TERM GOAL #2   Title Pt will decrease mODI score by at least 13 points in order demonstrate clinically significant reduction in back pain/disability.    Baseline 12/19/20: 24%; 02/06/21: 16%    Time 8    Period Weeks    Status Partially Met    Target Date 02/13/21      PT LONG TERM GOAL #3   Title Pt will improve FOTO to at least 58 in order to demonstrate signficant imrpovement in function related to her her low back/hip pain    Baseline 12/19/20: 34; 02/06/21: 55;    Time 8    Period Weeks     Status Partially Met    Target Date 02/13/21      PT LONG TERM GOAL #4   Title Pt will increase strength of bilateral hip abduction and extension by at least 1/2 MMT grade in order to demonstrate improvement in strength and function.    Baseline 12/19/20: 4- bilaterally for both abduction and extension; 02/06/21: R/L abduction: 4/4 extension: 4-/4-  Time 8    Period Weeks    Status Partially Met    Target Date 02/13/21                   Plan - 02/06/21 1614     Clinical Impression Statement Pt demonstrates excellent motivation during session today. Updated outcome measures and goals with patient. Her mODI decreased to 16% from the initial evaluation and her FOTO increased to 55 today. She reports approximately 75% improvement in her symptoms since starting therapy. Her worst pain is now 4/10 and she also note decreased frequency and duration of her pain when it occurs. Continued with R hip and low back strengthening today. Will continue to progress strengthening for long-term management of pain. Pt encouraged to follow-up as scheduled. She will benefit from PT services to address deficits in R low back/hip pain in order to return to full function at home with less pain.    Personal Factors and Comorbidities Age;Comorbidity 2    Comorbidities OA, IBS    Examination-Activity Limitations Transfers;Stairs    Examination-Participation Restrictions Church;Community Activity;Meal Prep    Stability/Clinical Decision Making Stable/Uncomplicated    Rehab Potential Good    PT Frequency 2x / week    PT Duration 8 weeks    PT Treatment/Interventions ADLs/Self Care Home Management;Aquatic Therapy;Biofeedback;Canalith Repostioning;Cryotherapy;Electrical Stimulation;Iontophoresis 51m/ml Dexamethasone;Moist Heat;Traction;Ultrasound;Functional mobility training;Therapeutic activities;Therapeutic exercise;Balance training;Neuromuscular re-education;Manual techniques;Passive range of motion;Dry  needling;Vestibular;Visual/perceptual remediation/compensation;Spinal Manipulations;Joint Manipulations    PT Next Visit Plan LE strengthening and manual techniques    PT Home Exercise Plan Access Code: [email or web address]    Consulted and Agree with Plan of Care Patient              Patient will benefit from skilled therapeutic intervention in order to improve the following deficits and impairments:  Pain, Decreased strength  Visit Diagnosis: Pain in right hip  Acute right-sided low back pain, unspecified whether sciatica present     Problem List Patient Active Problem List   Diagnosis Date Noted   Ankle swelling 11/26/2020   Low back pain 11/22/2020   Hip pain, right 11/22/2020   Light headedness 06/24/2020   Hearing loss 06/24/2020   Change in vision 06/24/2020   Stress 05/17/2020   Chest pain 05/16/2020   Welcome to Medicare preventive visit 06/20/2019   Viral syndrome 01/30/2019   Itching 07/21/2017   Asthma 07/16/2016   Urinary incontinence 07/16/2016   SOB (shortness of breath) 04/01/2016   Bilateral shoulder pain 02/24/2016   Fatigue 01/28/2016   Neck fullness 01/28/2016   Routine general medical examination at a health care facility 07/25/2015   Health care maintenance 10/09/2014   Neck pain 11/26/2013   Hypercholesterolemia 11/26/2013   History of colonic polyps 05/11/2013   Hyperbilirubinemia 01/18/2013   GERD (gastroesophageal reflux disease) 12/03/2012   Cholelithiasis 11/07/2012   IBS (irritable bowel syndrome) 11/07/2012   History of rheumatic fever 08/28/2012   MVP (mitral valve prolapse) 08/28/2012   JPhillips GroutPT, DPT, GCS  Tanaisha Pittman 02/07/2021, 4:46 PM  [facility] [place] [address]Phone: [phone]  Fax:  [phone] Name: LRYVER ZADROZNYMRN: [id number] of Birth: [birth year]

## 2021-02-07 ENCOUNTER — Other Ambulatory Visit (INDEPENDENT_AMBULATORY_CARE_PROVIDER_SITE_OTHER): Payer: PPO

## 2021-02-07 DIAGNOSIS — E7800 Pure hypercholesterolemia, unspecified: Secondary | ICD-10-CM

## 2021-02-07 LAB — HEPATIC FUNCTION PANEL
ALT: 17 U/L (ref 0–35)
AST: 24 U/L (ref 0–37)
Albumin: 4.3 g/dL (ref 3.5–5.2)
Alkaline Phosphatase: 59 U/L (ref 39–117)
Bilirubin, Direct: 0.1 mg/dL (ref 0.0–0.3)
Total Bilirubin: 0.9 mg/dL (ref 0.2–1.2)
Total Protein: 6.9 g/dL (ref 6.0–8.3)

## 2021-02-07 LAB — BASIC METABOLIC PANEL
BUN: 13 mg/dL (ref 6–23)
CO2: 29 mEq/L (ref 19–32)
Calcium: 9.8 mg/dL (ref 8.4–10.5)
Chloride: 104 mEq/L (ref 96–112)
Creatinine, Ser: 0.73 mg/dL (ref 0.40–1.20)
GFR: 84.51 mL/min (ref >60.00)
Glucose, Bld: 83 mg/dL (ref 70–99)
Potassium: 5.1 mEq/L (ref 3.5–5.1)
Sodium: 141 mEq/L (ref 135–145)

## 2021-02-07 LAB — LIPID PANEL
Cholesterol: 218 mg/dL — ABNORMAL HIGH (ref 0–200)
HDL: 44.2 mg/dL (ref >39.00)
LDL Cholesterol: 139 mg/dL — ABNORMAL HIGH (ref 0–99)
NonHDL: 173.37
Total CHOL/HDL Ratio: 5
Triglycerides: 171 mg/dL — ABNORMAL HIGH (ref 0.0–149.0)
VLDL: 34.2 mg/dL (ref 0.0–40.0)

## 2021-02-09 ENCOUNTER — Ambulatory Visit (INDEPENDENT_AMBULATORY_CARE_PROVIDER_SITE_OTHER): Payer: PPO | Admitting: Internal Medicine

## 2021-02-09 ENCOUNTER — Other Ambulatory Visit: Payer: Self-pay

## 2021-02-09 ENCOUNTER — Ambulatory Visit: Payer: PPO

## 2021-02-09 VITALS — BP 118/70 | HR 89 | Temp 97.9°F | Resp 16 | Ht 67.0 in | Wt 178.2 lb

## 2021-02-09 DIAGNOSIS — F439 Reaction to severe stress, unspecified: Secondary | ICD-10-CM | POA: Diagnosis not present

## 2021-02-09 DIAGNOSIS — Z86010 Personal history of colonic polyps: Secondary | ICD-10-CM

## 2021-02-09 DIAGNOSIS — E7800 Pure hypercholesterolemia, unspecified: Secondary | ICD-10-CM | POA: Diagnosis not present

## 2021-02-09 DIAGNOSIS — T148XXA Other injury of unspecified body region, initial encounter: Secondary | ICD-10-CM

## 2021-02-09 DIAGNOSIS — J4520 Mild intermittent asthma, uncomplicated: Secondary | ICD-10-CM

## 2021-02-09 DIAGNOSIS — M545 Low back pain, unspecified: Secondary | ICD-10-CM

## 2021-02-09 DIAGNOSIS — M542 Cervicalgia: Secondary | ICD-10-CM

## 2021-02-09 DIAGNOSIS — E875 Hyperkalemia: Secondary | ICD-10-CM

## 2021-02-09 DIAGNOSIS — M25551 Pain in right hip: Secondary | ICD-10-CM

## 2021-02-09 LAB — POTASSIUM: Potassium: 4.3 mEq/L (ref 3.5–5.1)

## 2021-02-09 NOTE — Therapy (Signed)
[place] [facility] [address] [place], [place], [address] Phone: [phone]   Fax:  [phone]  Physical Therapy Treatment  Patient Details  Name: Lynn Mann MRN: [id number] Date of Birth: [birth year] Referring Provider (PT): Einar Pheasant   Encounter Date: 02/09/2021   PT End of Session - 02/09/21 1113     Visit Number 11    Number of Visits 17    Date for PT Re-Evaluation 02/13/21    Authorization Type eval: 12/19/20    PT Start Time 1100    PT Stop Time 1145    PT Time Calculation (min) 45 min    Activity Tolerance Patient tolerated treatment well    Behavior During Therapy [facility] for tasks assessed/performed              Past Medical History:  Diagnosis Date   Arthritis    C. difficile diarrhea    age 18s-40s    Chicken pox    Cholecystitis 11/2011   Did not require sgy - Dr. Staci Acosta - [facility]  (cholelithiasis)   H/O Clostridium difficile infection    IBS (irritable bowel syndrome)    MRSA exposure 2005   Spider bite   MVP (mitral valve prolapse)    Stable - Dr. Ubaldo Glassing   Rheumatic fever     Past Surgical History:  Procedure Laterality Date   CATARACT EXTRACTION  [id number]   MUSCLE BIOPSY      There were no vitals filed for this visit.    Subjective Assessment - 02/09/21 1112     Subjective Pt reports that she is doing alright today. Fatigue has been slightly better over the last couple days. No back pain upon arrival but she continues having some neck pain. No specific questions or concerns currently.    Pertinent History Pt reports R posterior and anterior (groin) hip pain which started 6 weeks ago. Pain started suddenly and then gradually increased but has remained constant since then. She denies any radicular symptoms. Occasionally she has BLE weakness as well which she associates with the hip pain. After the pain started she did have a fall off her mattress onto the floor. She denies injury or any any  increase in her pain. Denies any prior history of hip/back pain or surgeries. Her legs occasionally feel heavy but she denies any numbness or tingling in BLE. Lumbar radiographs showed unchanged moderate disc height loss at L4-L5 with degenerative grade 1 anterolisthesis at this level. Moderate lower lumbar predominant facet arthritis. R hip flims showed no significant arthropathy, fracture, or malalignment.  She reports a history of scoliosis and osteopenia and has previously participated in physical therapy with pelvic health therapist for urinary incontinence as well as vestibular therapy for dizziness/imbalance. Patient's husband passed unexpectedly 03/26/20 and she is still trying to cope with the stress from this.  Also trying to get all of the legal matters addressed. Recently saw cardiology and echo shows no significant abnormalities. CXR and stress echo unremarkable. Sleep study negative for sleep apnea.    Diagnostic tests See history    Patient Stated Goals Pain to stop, strengthen legs, feel free to walk and have energy                TREATMENT     Ther-ex  NuStep L3 x 5 minutes for warm-up during history (3 minutes unbilled); Hooklying lumbar rocking x 1 minute; Hooklying anterior/posterior pelvic tilts 5s hold x 1 minute; Hooklying lateral pelvic tilts  5s hold x 1 minute; Hooklying alternating marching with posterior pelvic tilt 2 x 1 minute SLR but pt reports straining with pain in back of neck during attempts; Sidelying straight leg hip abduction 2 x 10 BLE; Sidelying clams with manual resistance from therapist 2 x 10 BLE; Sidelying reverse clams 2 x 10 BLE; Hooklying isometric ball squeezes 3s hold 2 x 10; Sit to stand from low mat table without UE support x 10;    Pt educated throughout session about proper posture and technique with exercises. Improved exercise technique, movement at target joints, use of target muscles after min to mod verbal, visual, tactile cues.       Pt demonstrates excellent motivation during session today. Continued with R hip and low back strengthening today. Provided additional neck support with towel roll while patient laying down to minimize neck pain. Plan is to discharge next week from PT related to her back pain. Pt encouraged to talk to Dr. Nicki Reaper regarding her neck pain and whether she would like to address this with physical therapy. Will review HEP at next session as part of discharge.                                   PT Short Term Goals - 02/06/21 1614       PT SHORT TERM GOAL #1   Title Pt will be independent with HEP in order to improve strength and decrease back/hip pain in order to improve pain-free function at home and work.    Time 4    Period Weeks    Status On-going    Target Date 01/16/21               PT Long Term Goals - 02/06/21 1615       PT LONG TERM GOAL #1   Title Pt will decrease worst back/hip pain as reported on NPRS by at least 3 points in order to demonstrate clinically significant reduction in back pain.    Baseline 12/19/20: worst: 10/10; 02/06/21: 4/10 (decreased frequency and duration)    Time 8    Period Weeks    Status Achieved      PT LONG TERM GOAL #2   Title Pt will decrease mODI score by at least 13 points in order demonstrate clinically significant reduction in back pain/disability.    Baseline 12/19/20: 24%; 02/06/21: 16%    Time 8    Period Weeks    Status Partially Met    Target Date 02/13/21      PT LONG TERM GOAL #3   Title Pt will improve FOTO to at least 58 in order to demonstrate signficant imrpovement in function related to her her low back/hip pain    Baseline 12/19/20: 34; 02/06/21: 55;    Time 8    Period Weeks    Status Partially Met    Target Date 02/13/21      PT LONG TERM GOAL #4   Title Pt will increase strength of bilateral hip abduction and extension by at least 1/2 MMT grade in order to demonstrate improvement in strength and  function.    Baseline 12/19/20: 4- bilaterally for both abduction and extension; 02/06/21: R/L abduction: 4/4 extension: 4-/4-    Time 8    Period Weeks    Status Partially Met    Target Date 02/13/21  Plan - 02/09/21 1113     Clinical Impression Statement Pt demonstrates excellent motivation during session today. Continued with R hip and low back strengthening today. Provided additional neck support with towel roll while patient laying down to minimize neck pain. Plan is to discharge next week from PT related to her back pain. Pt encouraged to talk to Dr. Nicki Reaper regarding her neck pain and whether she would like to address this with physical therapy. Will review HEP at next session as part of discharge.    Personal Factors and Comorbidities Age;Comorbidity 2    Comorbidities OA, IBS    Examination-Activity Limitations Transfers;Stairs    Examination-Participation Restrictions Church;Community Activity;Meal Prep    Stability/Clinical Decision Making Stable/Uncomplicated    Rehab Potential Good    PT Frequency 2x / week    PT Duration 8 weeks    PT Treatment/Interventions ADLs/Self Care Home Management;Aquatic Therapy;Biofeedback;Canalith Repostioning;Cryotherapy;Electrical Stimulation;Iontophoresis 18m/ml Dexamethasone;Moist Heat;Traction;Ultrasound;Functional mobility training;Therapeutic activities;Therapeutic exercise;Balance training;Neuromuscular re-education;Manual techniques;Passive range of motion;Dry needling;Vestibular;Visual/perceptual remediation/compensation;Spinal Manipulations;Joint Manipulations    PT Next Visit Plan Discharge    PT Home Exercise Plan Access Code: [email or web address]    Consulted and Agree with Plan of Care Patient              Patient will benefit from skilled therapeutic intervention in order to improve the following deficits and impairments:  Pain, Decreased strength  Visit Diagnosis: Pain in right hip  Acute right-sided low back  pain, unspecified whether sciatica present     Problem List Patient Active Problem List   Diagnosis Date Noted   Ankle swelling 11/26/2020   Low back pain 11/22/2020   Hip pain, right 11/22/2020   Light headedness 06/24/2020   Hearing loss 06/24/2020   Change in vision 06/24/2020   Stress 05/17/2020   Chest pain 05/16/2020   Welcome to Medicare preventive visit 06/20/2019   Viral syndrome 01/30/2019   Itching 07/21/2017   Asthma 07/16/2016   Urinary incontinence 07/16/2016   SOB (shortness of breath) 04/01/2016   Bilateral shoulder pain 02/24/2016   Fatigue 01/28/2016   Neck fullness 01/28/2016   Routine general medical examination at a health care facility 07/25/2015   Health care maintenance 10/09/2014   Neck pain 11/26/2013   Hypercholesterolemia 11/26/2013   History of colonic polyps 05/11/2013   Hyperbilirubinemia 01/18/2013   GERD (gastroesophageal reflux disease) 12/03/2012   Cholelithiasis 11/07/2012   IBS (irritable bowel syndrome) 11/07/2012   History of rheumatic fever 08/28/2012   MVP (mitral valve prolapse) 08/28/2012   JPhillips GroutPT, DPT, GCS  Tierre Gerard 02/09/2021, 12:16 PM  [place] [facility] [place] [address]Phone: [phone]  Fax:  [phone] Name: Lynn MURRYMRN: [id number] of Birth: [birth year]

## 2021-02-09 NOTE — Assessment & Plan Note (Addendum)
The 10-year ASCVD risk score Mikey Bussing DC Brooke Bonito., et al., 2013) is: 7.2%   Values used to calculate the score:     Age: 70 years     Sex: Female     Is Non-Hispanic African American: No     Diabetic: No     Tobacco smoker: No     Systolic Blood Pressure: 209 mmHg     Is BP treated: No     HDL Cholesterol: 44.2 mg/dL     Total Cholesterol: 218 mg/dL  Low cholesterol diet and exercise.  Follow lipid panel.

## 2021-02-09 NOTE — Progress Notes (Signed)
Patient ID: Lynn Mann, female   DOB: [birth year], 70 y.o.   MRN: [id number]   Subjective:    Patient ID: Lynn Mann, female    DOB: 03/11/1951, 70 y.o.   MRN: [id number]  HPI This visit occurred during the SARS-CoV-2 public health emergency.  Safety protocols were in place, including screening questions prior to the visit, additional usage of staff PPE, and extensive cleaning of exam room while observing appropriate contact time as indicated for disinfecting solutions.   Patient here for a scheduled follow up.  Was recently evaluated [facility] - s/p fall.  Injured her right foot - lateral three toes.  No pain now.  Does have hematoma - below left knee.  Still - raised firm area.  Is smaller.  Gradually reabsorbing.  Seeing PT.  Also evaluated recently in ER for neck pain.  Note reviewed.  CT soft tissue neck and c-spine CT reviewed - as outlined.  States noticed what felt like lightening shoot up her neck.  Increased discomfort with rotating her head.  Some pain in her right collarbone.  Persistent issue for her.  PT for her right hip.  No chest pain reported.  No increased sob.  No abdominal pain.  Eating.  Bowels stable.    Past Medical History:  Diagnosis Date   Arthritis    C. difficile diarrhea    age 60s-40s    Chicken pox    Cholecystitis 11/2011   Did not require sgy - Dr. Staci Acosta - [facility]  (cholelithiasis)   H/O Clostridium difficile infection    IBS (irritable bowel syndrome)    MRSA exposure 2005   Spider bite   MVP (mitral valve prolapse)    Stable - Dr. Ubaldo Glassing   Rheumatic fever    Past Surgical History:  Procedure Laterality Date   CATARACT EXTRACTION  [id number]   MUSCLE BIOPSY     Family History  Problem Relation Age of Onset   Arthritis Mother    Stroke Mother    Diabetes Mother    Hypertension Mother    Arthritis Father    Stroke Father    Diabetes Paternal Grandmother    Cancer Paternal Uncle        colon   Heart disease Other        maternal  and paternal side   Breast cancer Paternal 79    Breast cancer Cousin    Breast cancer Cousin        female cousin   Breast cancer Cousin    Social History   Socioeconomic History   Marital status: Widowed    Spouse name: Not on file   Number of children: Not on file   Years of education: 16   Highest education level: Not on file  Occupational History   Occupation: Retired  Tobacco Use   Smoking status: Never   Smokeless tobacco: Never  Vaping Use   Vaping Use: Never used  Substance and Sexual Activity   Alcohol use: Yes    Alcohol/week: 0.0 standard drinks    Comment: Rarely   Drug use: No   Sexual activity: Yes    Partners: Male    Birth control/protection: Post-menopausal  Other Topics Concern   Not on file  Social History Narrative   Not on file   Social Determinants of Health   Financial Resource Strain: Low Risk    Difficulty of Paying Living Expenses: Not hard at all  Food Insecurity: No Food Insecurity  Worried About Charity fundraiser in the Last Year: Never true   [place] in the Last Year: Never true  Transportation Needs: No Transportation Needs   Lack of Transportation (Medical): No   Lack of Transportation (Non-Medical): No  Physical Activity: Not on file  Stress: No Stress Concern Present   Feeling of Stress : Not at all  Social Connections: Unknown   Frequency of Communication with Friends and Family: More than three times a week   Frequency of Social Gatherings with Friends and Family: More than three times a week   Attends Religious Services: Not on file   Active Member of Clubs or Organizations: Not on file   Attends Archivist Meetings: Not on file   Marital Status: Widowed    Review of Systems  Constitutional:  Negative for appetite change and unexpected weight change.  HENT:  Negative for congestion and sinus pressure.   Respiratory:  Negative for cough, chest tightness and shortness of breath.   Cardiovascular:   Negative for chest pain, palpitations and leg swelling.  Gastrointestinal:  Negative for abdominal pain, diarrhea, nausea and vomiting.  Genitourinary:  Negative for difficulty urinating and dysuria.  Musculoskeletal:  Positive for neck pain. Negative for joint swelling and myalgias.       Right foot/toe pain resolved.  Hematoma - below left knee.  (Per her report - smaller). No evidence of infection.  Minimal tenderness to palpation.  No pain with flexion and extension at the knee.    Skin:  Negative for color change and rash.  Neurological:  Negative for dizziness, light-headedness and headaches.  Psychiatric/Behavioral:  Negative for agitation and dysphoric mood.       Objective:    Physical Exam Vitals reviewed.  Constitutional:      General: She is not in acute distress.    Appearance: Normal appearance.  HENT:     Head: Normocephalic and atraumatic.     Right Ear: External ear normal.     Left Ear: External ear normal.  Eyes:     General: No scleral icterus.       Right eye: No discharge.        Left eye: No discharge.     Conjunctiva/sclera: Conjunctivae normal.  Neck:     Thyroid: No thyromegaly.  Cardiovascular:     Rate and Rhythm: Normal rate and regular rhythm.  Pulmonary:     Effort: No respiratory distress.     Breath sounds: Normal breath sounds. No wheezing.  Abdominal:     General: Bowel sounds are normal.     Palpations: Abdomen is soft.     Tenderness: There is no abdominal tenderness.  Musculoskeletal:        General: No swelling or tenderness.     Cervical back: Neck supple. No tenderness.  Lymphadenopathy:     Cervical: No cervical adenopathy.  Skin:    Findings: No erythema or rash.     Comments: Hematoma - below left knee.  (Per pt smaller).  No increased erythema.  No significant increased warmth.  Good rom.    Neurological:     Mental Status: She is alert.  Psychiatric:        Mood and Affect: Mood normal.        Behavior: Behavior normal.     BP 118/70   Pulse 89   Temp 97.9 F (36.6 C)   Resp 16   Ht 5\' 7"  (1.702 m)   Wt  178 lb 3.2 oz (80.8 kg)   SpO2 98%   BMI 27.91 kg/m  Wt Readings from Last 3 Encounters:  02/09/21 178 lb 3.2 oz (80.8 kg)  01/31/21 179 lb 3.7 oz (81.3 kg)  01/20/21 179 lb 3.7 oz (81.3 kg)    Outpatient Encounter Medications as of 02/09/2021  Medication Sig   albuterol (VENTOLIN HFA) 108 (90 Base) MCG/ACT inhaler Inhale 2 puffs into the lungs every 6 (six) hours as needed for wheezing or shortness of breath.   aspirin 325 MG tablet Take by mouth.   cyclobenzaprine (FLEXERIL) 5 MG tablet Take 1 tablet (5 mg total) by mouth 3 (three) times daily as needed for muscle spasms.   Multiple Vitamins-Minerals (PRESERVISION AREDS 2 PO) Take by mouth.   No facility-administered encounter medications on file as of 02/09/2021.     Lab Results  Component Value Date   WBC 6.9 01/31/2021   HGB 13.0 01/31/2021   HCT 39.9 01/31/2021   PLT 300 01/31/2021   GLUCOSE 83 02/07/2021   CHOL 218 (H) 02/07/2021   TRIG 171.0 (H) 02/07/2021   HDL 44.20 02/07/2021   LDLDIRECT 110.0 02/11/2017   LDLCALC 139 (H) 02/07/2021   ALT 17 02/07/2021   AST 24 02/07/2021   NA 141 02/07/2021   K 4.3 02/09/2021   CL 104 02/07/2021   CREATININE 0.73 02/07/2021   BUN 13 02/07/2021   CO2 29 02/07/2021   TSH 2.04 09/19/2020   HGBA1C 5.4 07/15/2017    DG Chest 2 View  Result Date: 01/31/2021 CLINICAL DATA:  Sudden onset neck pain. EXAM: CHEST - 2 VIEW COMPARISON:  05/16/2020 FINDINGS: The heart size and mediastinal contours are within normal limits. Both lungs are clear. The visualized skeletal structures are unremarkable. IMPRESSION: No active cardiopulmonary disease. Electronically Signed   By: Lucienne Capers M.D.   On: 01/31/2021 19:02   CT Soft Tissue Neck W Contrast  Result Date: 01/31/2021 CLINICAL DATA:  Cervical lymphadenopathy. EXAM: CT NECK WITH CONTRAST TECHNIQUE: Multidetector CT imaging of the neck was  performed using the standard protocol following the bolus administration of intravenous contrast. CONTRAST:  29mL OMNIPAQUE IOHEXOL 300 MG/ML  SOLN COMPARISON:  None. FINDINGS: PHARYNX AND LARYNX: The nasopharynx, oropharynx and larynx are normal. Visible portions of the oral cavity, tongue base and floor of mouth are normal. Normal epiglottis, vallecula and pyriform sinuses. The larynx is normal. No retropharyngeal abscess, effusion or lymphadenopathy. SALIVARY GLANDS: Normal parotid, submandibular and sublingual glands. THYROID: Normal. LYMPH NODES: No enlarged or abnormal density lymph nodes. VASCULAR: Major cervical vessels are patent. LIMITED INTRACRANIAL: Normal. VISUALIZED ORBITS: Normal. MASTOIDS AND VISUALIZED PARANASAL SINUSES: No fluid levels or advanced mucosal thickening. No mastoid effusion. SKELETON: Multilevel facet arthrosis. No bony spinal canal stenosis. UPPER CHEST: Clear. OTHER: None. IMPRESSION: No cervical lymphadenopathy. Electronically Signed   By: Ulyses Jarred M.D.   On: 01/31/2021 20:18   CT C-SPINE NO CHARGE  Result Date: 01/31/2021 CLINICAL DATA:  Neck pain. EXAM: CT CERVICAL SPINE WITHOUT CONTRAST TECHNIQUE: Multidetector CT imaging of the cervical spine were reformatted from contrast enhanced CT of the soft tissue neck. Multiplanar CT image reconstructions were also generated. COMPARISON:  Cervical spine radiographs 03/06/2020, cervical CT 03/01/2016 FINDINGS: Alignment: The straightening of normal cervical lordosis. 2 mm anterolisthesis of C3 on C4. There is mild broad-based rightward curvature of the cervical spine. Skull base and vertebrae: No acute fracture. No primary bone lesion or focal pathologic process. Questionable os odontoideum. Soft tissues and spinal canal: Assessed on  concurrent neck CT, reported separately. Disc levels: Mild endplate spurring at D6-L8 and C6-C7. Moderate multilevel facet hypertrophy. Multilevel neural foraminal stenosis. Upper chest: Assessed fully  on concurrent neck CT. Other: None. IMPRESSION: 1. Mild broad-based rightward curvature of the cervical spine with multilevel facet hypertrophy. Multilevel neural foraminal stenosis. 2. Chronic straightening of normal lordosis. Electronically Signed   By: Keith Rake M.D.   On: 01/31/2021 20:06       Assessment & Plan:   Problem List Items Addressed This Visit     Asthma    Breathing stable.  Has albuterol to use prn.  Follow.        Hematoma    Hematoma - below left knee.  Smaller - per pt.  No increased erythema or evidence of infection.  Should continue to reabsorb.  Discussed f/u with ortho to confirm no further intervention warranted.  She declines.  Wants to monitor.         Hip pain, right    Going to PT.  Follow.        History of colonic polyps    Colonoscopy 08/2017 - tubular adenoma.  Recommended f/u colonoscopy in 5 years.        Hyperbilirubinemia    Last bilirubin level wnl.  Follow.        Hypercholesterolemia    The 10-year ASCVD risk score Mikey Bussing DC Brooke Bonito., et al., 2013) is: 7.2%   Values used to calculate the score:     Age: 5 years     Sex: Female     Is Non-Hispanic African American: No     Diabetic: No     Tobacco smoker: No     Systolic Blood Pressure: 756 mmHg     Is BP treated: No     HDL Cholesterol: 44.2 mg/dL     Total Cholesterol: 218 mg/dL  Low cholesterol diet and exercise.  Follow lipid panel.        Neck pain    Persistent neck pain as outlined.  Evaluated in ER.  CT soft tissue neck and c-spine - reviewed.  Placed on flexeril.  Given persistent increased pain, symptoms and findings on CT scan, discussed neurosurgery referral for further evaluation and treatment.  She declines.  Will notify if changes her mind.  Hold on therapy until evaluation by [facility].        Stress    Overall appears to be handling things relatively well.  Follow.         Other Visit Diagnoses     Hyperkalemia    -  Primary   Relevant Orders   Potassium  (Completed)        Einar Pheasant, MD

## 2021-02-11 ENCOUNTER — Encounter: Payer: Self-pay | Admitting: Internal Medicine

## 2021-02-11 DIAGNOSIS — T148XXA Other injury of unspecified body region, initial encounter: Secondary | ICD-10-CM | POA: Insufficient documentation

## 2021-02-11 NOTE — Assessment & Plan Note (Signed)
Breathing stable.  Has albuterol to use prn.  Follow.

## 2021-02-11 NOTE — Assessment & Plan Note (Signed)
Hematoma - below left knee.  Smaller - per pt.  No increased erythema or evidence of infection.  Should continue to reabsorb.  Discussed f/u with ortho to confirm no further intervention warranted.  She declines.  Wants to monitor.

## 2021-02-11 NOTE — Assessment & Plan Note (Signed)
Overall appears to be handling things relatively well.  Follow.   

## 2021-02-11 NOTE — Assessment & Plan Note (Signed)
Persistent neck pain as outlined.  Evaluated in ER.  CT soft tissue neck and c-spine - reviewed.  Placed on flexeril.  Given persistent increased pain, symptoms and findings on CT scan, discussed neurosurgery referral for further evaluation and treatment.  She declines.  Will notify if changes her mind.  Hold on therapy until evaluation by [facility].

## 2021-02-11 NOTE — Assessment & Plan Note (Signed)
Last bilirubin level wnl.  Follow.

## 2021-02-11 NOTE — Assessment & Plan Note (Signed)
Going to PT.  Follow.  

## 2021-02-11 NOTE — Assessment & Plan Note (Signed)
Colonoscopy 08/2017 - tubular adenoma.  Recommended f/u colonoscopy in 5 years.  

## 2021-03-02 ENCOUNTER — Encounter: Payer: Self-pay | Admitting: Internal Medicine

## 2021-03-02 ENCOUNTER — Other Ambulatory Visit: Payer: Self-pay

## 2021-03-02 ENCOUNTER — Ambulatory Visit (INDEPENDENT_AMBULATORY_CARE_PROVIDER_SITE_OTHER): Payer: PPO | Admitting: Internal Medicine

## 2021-03-02 DIAGNOSIS — T148XXA Other injury of unspecified body region, initial encounter: Secondary | ICD-10-CM | POA: Diagnosis not present

## 2021-03-02 DIAGNOSIS — M542 Cervicalgia: Secondary | ICD-10-CM | POA: Diagnosis not present

## 2021-03-02 DIAGNOSIS — E7800 Pure hypercholesterolemia, unspecified: Secondary | ICD-10-CM

## 2021-03-02 DIAGNOSIS — Z86010 Personal history of colonic polyps: Secondary | ICD-10-CM | POA: Diagnosis not present

## 2021-03-02 DIAGNOSIS — J4520 Mild intermittent asthma, uncomplicated: Secondary | ICD-10-CM | POA: Diagnosis not present

## 2021-03-02 NOTE — Progress Notes (Signed)
Patient ID: Lynn Mann, female   DOB: 1950-12-27, 70 y.o.   MRN: [id number]   Subjective:    Patient ID: Lynn Mann, female    DOB: 1950-10-26, 70 y.o.   MRN: [id number]  HPI This visit occurred during the SARS-CoV-2 public health emergency.  Safety protocols were in place, including screening questions prior to the visit, additional usage of staff PPE, and extensive cleaning of exam room while observing appropriate contact time as indicated for disinfecting solutions.   Patient here for scheduled follow up. Here to follow up regarding persistent neck pain.  Recent fall as outlined in previous note.  Had CT - c spine and soft tissue neck.  Persistent neck pain and pain into her upper shoulder and top of her clavicle.  No chest pain.  Breathing stable.  No increased cough or congestion as outlined.  Eating.  No nausea or vomiting reported.  Bowels moving.  Previous hematoma (below knee) - better.  Discussed previous CT and referral to physiatry.     Past Medical History:  Diagnosis Date   Arthritis    C. difficile diarrhea    age 42s-40s    Chicken pox    Cholecystitis 11/2011   Did not require sgy - Dr. Staci Acosta - [facility]  (cholelithiasis)   H/O Clostridium difficile infection    IBS (irritable bowel syndrome)    MRSA exposure 2005   Spider bite   MVP (mitral valve prolapse)    Stable - Dr. Ubaldo Glassing   Rheumatic fever    Past Surgical History:  Procedure Laterality Date   CATARACT EXTRACTION  [id number]   MUSCLE BIOPSY     Family History  Problem Relation Age of Onset   Arthritis Mother    Stroke Mother    Diabetes Mother    Hypertension Mother    Arthritis Father    Stroke Father    Diabetes Paternal Grandmother    Cancer Paternal Uncle        colon   Heart disease Other        maternal and paternal side   Breast cancer Paternal 33    Breast cancer Cousin    Breast cancer Cousin        female cousin   Breast cancer Cousin    Social History   Socioeconomic History    Marital status: Widowed    Spouse name: Not on file   Number of children: Not on file   Years of education: 16   Highest education level: Not on file  Occupational History   Occupation: Retired  Tobacco Use   Smoking status: Never   Smokeless tobacco: Never  Vaping Use   Vaping Use: Never used  Substance and Sexual Activity   Alcohol use: Yes    Alcohol/week: 0.0 standard drinks    Comment: Rarely   Drug use: No   Sexual activity: Yes    Partners: Male    Birth control/protection: Post-menopausal  Other Topics Concern   Not on file  Social History Narrative   Not on file   Social Determinants of Health   Financial Resource Strain: Low Risk    Difficulty of Paying Living Expenses: Not hard at all  Food Insecurity: No Food Insecurity   Worried About Charity fundraiser in the Last Year: Never true   [place] in the Last Year: Never true  Transportation Needs: No Transportation Needs   Lack of Transportation (Medical): No   Lack of  Transportation (Non-Medical): No  Physical Activity: Not on file  Stress: No Stress Concern Present   Feeling of Stress : Not at all  Social Connections: Unknown   Frequency of Communication with Friends and Family: More than three times a week   Frequency of Social Gatherings with Friends and Family: More than three times a week   Attends Religious Services: Not on file   Active Member of Clubs or Organizations: Not on file   Attends Archivist Meetings: Not on file   Marital Status: Widowed    Review of Systems  Constitutional:  Negative for appetite change and unexpected weight change.  HENT:  Negative for congestion and sinus pressure.   Respiratory:  Negative for cough, chest tightness and shortness of breath.   Cardiovascular:  Negative for chest pain, palpitations and leg swelling.  Gastrointestinal:  Negative for abdominal pain, diarrhea, nausea and vomiting.  Genitourinary:  Negative for difficulty urinating  and dysuria.  Musculoskeletal:  Positive for neck pain. Negative for joint swelling and myalgias.  Skin:  Negative for color change and rash.       Hematoma - improved.   Neurological:  Negative for dizziness, light-headedness and headaches.  Psychiatric/Behavioral:  Negative for agitation and dysphoric mood.       Objective:    Physical Exam Vitals reviewed.  Constitutional:      General: She is not in acute distress.    Appearance: Normal appearance.  HENT:     Head: Normocephalic and atraumatic.     Right Ear: External ear normal.     Left Ear: External ear normal.  Eyes:     General: No scleral icterus.       Right eye: No discharge.        Left eye: No discharge.     Conjunctiva/sclera: Conjunctivae normal.  Neck:     Thyroid: No thyromegaly.  Cardiovascular:     Rate and Rhythm: Normal rate and regular rhythm.  Pulmonary:     Effort: No respiratory distress.     Breath sounds: Normal breath sounds. No wheezing.  Abdominal:     General: Bowel sounds are normal.     Palpations: Abdomen is soft.     Tenderness: There is no abdominal tenderness.  Musculoskeletal:        General: No swelling or tenderness.     Cervical back: Neck supple. No tenderness.  Lymphadenopathy:     Cervical: No cervical adenopathy.  Skin:    Findings: No erythema or rash.     Comments: Palpable nodule - below knee.  Hematoma resolving   Neurological:     Mental Status: She is alert.  Psychiatric:        Mood and Affect: Mood normal.        Behavior: Behavior normal.    BP 120/88   Pulse 92   Temp (!) 96.4 F (35.8 C)   Ht 5' 7.01" (1.702 m)   Wt 178 lb 3.2 oz (80.8 kg)   SpO2 95%   BMI 27.90 kg/m  Wt Readings from Last 3 Encounters:  03/02/21 178 lb 3.2 oz (80.8 kg)  02/09/21 178 lb 3.2 oz (80.8 kg)  01/31/21 179 lb 3.7 oz (81.3 kg)    Outpatient Encounter Medications as of 03/02/2021  Medication Sig   albuterol (VENTOLIN HFA) 108 (90 Base) MCG/ACT inhaler Inhale 2 puffs into  the lungs every 6 (six) hours as needed for wheezing or shortness of breath.   aspirin 325 MG  tablet Take by mouth.   b complex vitamins capsule Take 1 capsule by mouth daily.   CALCIUM PO Take by mouth.   Cholecalciferol (VITAMIN D3 PO) Take by mouth.   cyclobenzaprine (FLEXERIL) 5 MG tablet Take 1 tablet (5 mg total) by mouth 3 (three) times daily as needed for muscle spasms.   Multiple Vitamins-Minerals (PRESERVISION AREDS 2 PO) Take by mouth.   No facility-administered encounter medications on file as of 03/02/2021.     Lab Results  Component Value Date   WBC 6.9 01/31/2021   HGB 13.0 01/31/2021   HCT 39.9 01/31/2021   PLT 300 01/31/2021   GLUCOSE 83 02/07/2021   CHOL 218 (H) 02/07/2021   TRIG 171.0 (H) 02/07/2021   HDL 44.20 02/07/2021   LDLDIRECT 110.0 02/11/2017   LDLCALC 139 (H) 02/07/2021   ALT 17 02/07/2021   AST 24 02/07/2021   NA 141 02/07/2021   K 4.3 02/09/2021   CL 104 02/07/2021   CREATININE 0.73 02/07/2021   BUN 13 02/07/2021   CO2 29 02/07/2021   TSH 2.04 09/19/2020   HGBA1C 5.4 07/15/2017    DG Chest 2 View  Result Date: 01/31/2021 CLINICAL DATA:  Sudden onset neck pain. EXAM: CHEST - 2 VIEW COMPARISON:  05/16/2020 FINDINGS: The heart size and mediastinal contours are within normal limits. Both lungs are clear. The visualized skeletal structures are unremarkable. IMPRESSION: No active cardiopulmonary disease. Electronically Signed   By: Lucienne Capers M.D.   On: 01/31/2021 19:02   CT Soft Tissue Neck W Contrast  Result Date: 01/31/2021 CLINICAL DATA:  Cervical lymphadenopathy. EXAM: CT NECK WITH CONTRAST TECHNIQUE: Multidetector CT imaging of the neck was performed using the standard protocol following the bolus administration of intravenous contrast. CONTRAST:  59m OMNIPAQUE IOHEXOL 300 MG/ML  SOLN COMPARISON:  None. FINDINGS: PHARYNX AND LARYNX: The nasopharynx, oropharynx and larynx are normal. Visible portions of the oral cavity, tongue base and floor of  mouth are normal. Normal epiglottis, vallecula and pyriform sinuses. The larynx is normal. No retropharyngeal abscess, effusion or lymphadenopathy. SALIVARY GLANDS: Normal parotid, submandibular and sublingual glands. THYROID: Normal. LYMPH NODES: No enlarged or abnormal density lymph nodes. VASCULAR: Major cervical vessels are patent. LIMITED INTRACRANIAL: Normal. VISUALIZED ORBITS: Normal. MASTOIDS AND VISUALIZED PARANASAL SINUSES: No fluid levels or advanced mucosal thickening. No mastoid effusion. SKELETON: Multilevel facet arthrosis. No bony spinal canal stenosis. UPPER CHEST: Clear. OTHER: None. IMPRESSION: No cervical lymphadenopathy. Electronically Signed   By: KUlyses JarredM.D.   On: 01/31/2021 20:18   CT C-SPINE NO CHARGE  Result Date: 01/31/2021 CLINICAL DATA:  Neck pain. EXAM: CT CERVICAL SPINE WITHOUT CONTRAST TECHNIQUE: Multidetector CT imaging of the cervical spine were reformatted from contrast enhanced CT of the soft tissue neck. Multiplanar CT image reconstructions were also generated. COMPARISON:  Cervical spine radiographs 03/06/2020, cervical CT 03/01/2016 FINDINGS: Alignment: The straightening of normal cervical lordosis. 2 mm anterolisthesis of C3 on C4. There is mild broad-based rightward curvature of the cervical spine. Skull base and vertebrae: No acute fracture. No primary bone lesion or focal pathologic process. Questionable os odontoideum. Soft tissues and spinal canal: Assessed on concurrent neck CT, reported separately. Disc levels: Mild endplate spurring at [id number] C6-C7. Moderate multilevel facet hypertrophy. Multilevel neural foraminal stenosis. Upper chest: Assessed fully on concurrent neck CT. Other: None. IMPRESSION: 1. Mild broad-based rightward curvature of the cervical spine with multilevel facet hypertrophy. Multilevel neural foraminal stenosis. 2. Chronic straightening of normal lordosis. Electronically Signed   By: MThreasa Beards  [place] M.D.   On: 01/31/2021 20:06        Assessment & Plan:   Problem List Items Addressed This Visit     Asthma    Breathing stable.         Hematoma    Resolving.  Discussed.  Will continue to follow.  Notify me if incomplete resolution.        History of colonic polyps    Colonoscopy 08/2017 - tubular adenoma.  Recommended f/u colonoscopy in 5 years.        Hypercholesterolemia    The 10-year ASCVD risk score Mikey Bussing DC Brooke Bonito., et al., 2013) is: 7.5%   Values used to calculate the score:     Age: 66 years     Sex: Female     Is Non-Hispanic African American: No     Diabetic: No     Tobacco smoker: No     Systolic Blood Pressure: 123456 mmHg     Is BP treated: No     HDL Cholesterol: 44.2 mg/dL     Total Cholesterol: 218 mg/dL  Low cholesterol diet and exercise.  Follow lipid panel.        Neck pain    Persistent neck pain as outlined.  CT - c spine - mild broad-based rightward curvature of the cervical spine with multilevel facet hypertrophy. Multilevel neural foraminal stenosis. Tried muscle relaxer.  Discussed further evaluation.  Refer to Dr Sharlet Salina.         Relevant Orders   Ambulatory referral to Orthopedic Surgery     Einar Pheasant, MD

## 2021-03-03 ENCOUNTER — Encounter: Payer: Self-pay | Admitting: Internal Medicine

## 2021-03-03 NOTE — Assessment & Plan Note (Signed)
Resolving.  Discussed.  Will continue to follow.  Notify me if incomplete resolution.

## 2021-03-03 NOTE — Assessment & Plan Note (Signed)
Breathing stable.

## 2021-03-03 NOTE — Assessment & Plan Note (Signed)
Persistent neck pain as outlined.  CT - c spine - mild broad-based rightward curvature of the cervical spine with multilevel facet hypertrophy. Multilevel neural foraminal stenosis. Tried muscle relaxer.  Discussed further evaluation.  Refer to Dr Sharlet Salina.

## 2021-03-03 NOTE — Assessment & Plan Note (Signed)
Colonoscopy 08/2017 - tubular adenoma.  Recommended f/u colonoscopy in 5 years.  

## 2021-03-03 NOTE — Assessment & Plan Note (Signed)
The 10-year ASCVD risk score Mikey Bussing DC Brooke Bonito., et al., 2013) is: 7.5%   Values used to calculate the score:     Age: 70 years     Sex: Female     Is Non-Hispanic African American: No     Diabetic: No     Tobacco smoker: No     Systolic Blood Pressure: 123456 mmHg     Is BP treated: No     HDL Cholesterol: 44.2 mg/dL     Total Cholesterol: 218 mg/dL  Low cholesterol diet and exercise.  Follow lipid panel.

## 2021-03-08 ENCOUNTER — Telehealth: Payer: Self-pay

## 2021-03-08 NOTE — Telephone Encounter (Signed)
Patient called in requesting to speak with me and inquire about her referral to Dr Sharlet Salina office. Patient wanted to know why their office called to set her up with a NP instead of a MD. Advised that referral was placed to his office typically a patient is offered first available appt unless the patient specifies who they would like to see. Patient stated that she would feel more comfortable seeing MD and asked if new referral needed to be placed. Advised that new referral is not needed and she can call Dr Sharlet Salina office to schedule an appt with one of their doctors. Patient gave verbal understanding

## 2021-03-14 DIAGNOSIS — M5412 Radiculopathy, cervical region: Secondary | ICD-10-CM | POA: Diagnosis not present

## 2021-03-14 DIAGNOSIS — M542 Cervicalgia: Secondary | ICD-10-CM | POA: Diagnosis not present

## 2021-03-14 DIAGNOSIS — S29011A Strain of muscle and tendon of front wall of thorax, initial encounter: Secondary | ICD-10-CM | POA: Diagnosis not present

## 2021-03-21 DIAGNOSIS — S43211A Anterior subluxation of right sternoclavicular joint, initial encounter: Secondary | ICD-10-CM | POA: Diagnosis not present

## 2021-03-21 DIAGNOSIS — W010XXA Fall on same level from slipping, tripping and stumbling without subsequent striking against object, initial encounter: Secondary | ICD-10-CM | POA: Diagnosis not present

## 2021-03-21 DIAGNOSIS — M25511 Pain in right shoulder: Secondary | ICD-10-CM | POA: Diagnosis not present

## 2021-03-21 DIAGNOSIS — M19011 Primary osteoarthritis, right shoulder: Secondary | ICD-10-CM | POA: Diagnosis not present

## 2021-05-04 ENCOUNTER — Other Ambulatory Visit: Payer: Self-pay | Admitting: Internal Medicine

## 2021-05-04 DIAGNOSIS — Z1231 Encounter for screening mammogram for malignant neoplasm of breast: Secondary | ICD-10-CM

## 2021-05-17 ENCOUNTER — Ambulatory Visit: Payer: PPO

## 2021-05-22 ENCOUNTER — Ambulatory Visit: Payer: PPO

## 2021-05-22 ENCOUNTER — Ambulatory Visit: Payer: PPO | Attending: Sports Medicine

## 2021-05-22 DIAGNOSIS — M25511 Pain in right shoulder: Secondary | ICD-10-CM | POA: Insufficient documentation

## 2021-05-22 DIAGNOSIS — M6281 Muscle weakness (generalized): Secondary | ICD-10-CM | POA: Diagnosis not present

## 2021-05-22 DIAGNOSIS — G8929 Other chronic pain: Secondary | ICD-10-CM

## 2021-05-22 DIAGNOSIS — H04129 Dry eye syndrome of unspecified lacrimal gland: Secondary | ICD-10-CM | POA: Diagnosis not present

## 2021-05-22 DIAGNOSIS — H353131 Nonexudative age-related macular degeneration, bilateral, early dry stage: Secondary | ICD-10-CM | POA: Diagnosis not present

## 2021-05-22 DIAGNOSIS — H40013 Open angle with borderline findings, low risk, bilateral: Secondary | ICD-10-CM | POA: Diagnosis not present

## 2021-05-22 DIAGNOSIS — H5203 Hypermetropia, bilateral: Secondary | ICD-10-CM | POA: Diagnosis not present

## 2021-05-22 DIAGNOSIS — H01009 Unspecified blepharitis unspecified eye, unspecified eyelid: Secondary | ICD-10-CM | POA: Diagnosis not present

## 2021-05-22 NOTE — Patient Instructions (Signed)
Access Code: [email or web address] URL: [email or web address] Date: 05/22/2021 Prepared by: Joneen Boers  Exercises Seated Scapular Retraction - 1 x daily - 7 x weekly - 3 sets - 10 reps - 5-15 seconds hold

## 2021-05-22 NOTE — Therapy (Signed)
[facility] [address] [place], [address] Phone: [phone]   Fax:  [phone]  Physical Therapy Evaluation  Patient Details  Name: Lynn Mann MRN: [id number] Date of Birth: [birth year] Referring Provider (PT): Rosalia Hammers, DO   Encounter Date: 05/22/2021   PT End of Session - 05/22/21 1039     Visit Number 1    Number of Visits 17    Date for PT Re-Evaluation 07/19/21    Authorization Type 1    Authorization Time Period 10    PT Start Time 1039    PT Stop Time 1157    PT Time Calculation (min) 78 min    Activity Tolerance Patient tolerated treatment well    Behavior During Therapy [facility] for tasks assessed/performed             Past Medical History:  Diagnosis Date   Arthritis    C. difficile diarrhea    age 36s-40s    Chicken pox    Cholecystitis 11/2011   Did not require sgy - Dr. Staci Acosta - [facility]  (cholelithiasis)   H/O Clostridium difficile infection    IBS (irritable bowel syndrome)    MRSA exposure 2005   Spider bite   MVP (mitral valve prolapse)    Stable - Dr. Ubaldo Glassing   Rheumatic fever     Past Surgical History:  Procedure Laterality Date   CATARACT EXTRACTION  [id number]   MUSCLE BIOPSY      There were no vitals filed for this visit.    Subjective Assessment - 05/22/21 1050     Subjective R shoulder: 1-2/10 currently (pectoralis muscle area), 10/10 at worst for the past 2 months. Pain has not improved since injury, pain is not steady, it just happens at times.    Pertinent History R shoulder did not start really hurting until about a month after her fall. Just started with a little pain at the [facility] joint and clavicle. Sometimes she also feels pain along her R pectoralis muscle in the shape of a triangle. Pt fell June 2022. Pt got up to go to the bathroom and tripped over a suitcase. Pt fell against the garment rack on her R side which kind of shielded her R side. Pt also  got a hematoma the size of a baked potato at her L leg afterwards. Pt hurt R toes during the fall which got better after 3 days. Went to urgent care for her leg. Hematoma has improved but still has a tiny knot in her L leg. Pt is R hand dominant. Pt was in PT around May to July 2022 for for her R hip which is better.    Patient Stated Goals Not hurt.    Currently in Pain? Yes    Pain Score 2     Pain Orientation Right    Pain Descriptors / Indicators Stabbing;Aching;Jabbing;Tender    Pain Type Chronic pain    Pain Onset More than a month ago    Pain Frequency Occasional    Aggravating Factors  R shoulder: Laying on her R shoulder, Raising her R arm up (pain is inconsistent), pushing, fixing her hair at times    Pain Relieving Factors R shoulder: heat does not seem to help it. Takes asprin or aleve which helps. Rest                [facility] PT Assessment - 05/22/21 1042  Assessment   Medical Diagnosis Acute pain of R shoulder, Fall, anterior subluxation of sternoclavicular joint, arthritis of R sternoclavicular joint    Referring Provider (PT) Rosalia Hammers, DO    Onset Date/Surgical Date 04/13/21    Hand Dominance Right    Next MD Visit Not reported    Prior Therapy yes for hip      Precautions   Precaution Comments No known precautions      Restrictions   Other Position/Activity Restrictions No known restrictions      Balance Screen   Has the patient fallen in the past 6 months Yes    How many times? 1    Has the patient had a decrease in activity level because of a fear of falling?  No    Is the patient reluctant to leave their home because of a fear of falling?  No      Home Environment   Additional Comments Pt lives alone in a one story home. 1 step to enter, no rail.      Observation/Other Assessments   Focus on Therapeutic Outcomes (FOTO)  R shoulder FOTO 49      Posture/Postural Control   Posture Comments forward neck, B protracted shoulders, slight R  lateral shift      AROM   Overall AROM Comments L [place] AROM WFL all planes    Right Shoulder Flexion 90 Degrees   171 degrees AAROM, decreased pain when lowering with resisted shoulder extension   Right Shoulder ABduction 168 Degrees    Right Shoulder Internal Rotation --   Functional IR R thumb to T 9 spinous process   Right Shoulder External Rotation --   Functional ER: R middle finger to L scapula   Cervical Flexion full, slight R [place] joint area symptoms    Cervical Extension WFL    Cervical - Right Side Bend WFL    Cervical - Left Side Bend WFL    Cervical - Right Rotation WFL, R pectoralis discomfort    Cervical - Left Rotation [facility]      Strength   Right Shoulder Flexion 4/5    Right Shoulder ABduction 4-/5    Right Shoulder Internal Rotation 4/5   Possible R pectoralis pain   Right Shoulder External Rotation 4+/5    Left Shoulder Flexion 4+/5    Left Shoulder ABduction 4/5    Left Shoulder Internal Rotation 4/5    Left Shoulder External Rotation 4/5    Right Elbow Flexion 4-/5    Right Elbow Extension 4/5    Left Elbow Flexion 4/5    Left Elbow Extension 4/5    Right Wrist Extension 4+/5    Left Wrist Extension 4/5      Palpation   Palpation comment TTP R [place] joint, sternal head on R slightly more palpable than L, TTP R calvicle and AC joint. Muscle tension R scalene area, upper trap muscle area.                        Objective measurements completed on examination: See above findings.   No blood pressure problems No latex allergies     R shoulder did not start really hurting until about a month after her fall. Just started with a little pain at the [facility] joint and clavicle. Sometimes she also feels pain along her R pectoralis muscle in the shape of a triangle. Pt fell June 2022. Pt got up to go to the bathroom and  tripped over a suitcase. Pt fell against the garment rack on her R side which kind of shielded her R side. Pt also got a hematoma the size of  a baked potato at her L leg afterwards. Pt hurt R toes during the fall which got better after 3 days. Went to urgent care for her leg. Hematoma has improved but still has a tiny knot in her L leg. Pt is R hand dominant. Pt was in PT around May to July 2022 for for her R hip which is better.   TTP R [place] joint, sternal head on R slightly more palpable than L, TTP R calvicle and AC joint. Muscle tension R scalene area, upper trap muscle area.    Medbridge Access Code [email or web address]  Has osteopenia  Therapeutic exercise Standing scapular retraction red band 5x5 seconds  Incresed discomfort with increased repetition  L cervical side bend stretch with PT stabilizing R clavicle   4x15 seconds  Supine R shoulder extension gentle isometrics, 10x2 with 5 second hold s  Seatd B scapular retraction 5x10 seconds  Pt was recommened to use ice R scapula for inflammation. Pt verbalized understanding.   Improved exercise technique, movement at target joints, use of target muscles after mod verbal, visual, tactile cues.      Manual therapy Seated STM R pectoralis and distal scalene muscle area to decrease tension     Response to treatment Fair tolerance to today's session.   Clinical impression  Pt is a 70 year old female who came to physical therapy secondary to R shoulder pain and history of falling. She also presents with altered posture, painful R shoulder AROM, R UE weakness, TTP, irritable symptoms, R clavicle more prominent compared to L, and difficulty performing tasks with R UE such as opening and closing doors, raising her arm, lifting items secondary to pain. Pt will benefit from skilled physical therapy services to address the aforementioned deficits.                          PT Education - 05/22/21 1230     Education Details ther-ex, HEP    Person(s) Educated Patient    Methods Explanation;Demonstration;Tactile cues;Verbal cues;Handout    Comprehension Verbalized  understanding;Returned demonstration              PT Short Term Goals - 05/22/21 1245       PT SHORT TERM GOAL #1   Title Pt will be independent with her initial HEP to decrease pain, improve strength, function, and ability to reach, push, and pull more comfortably.    Baseline Pt has started her HEP (05/22/2021)    Time 3    Period Weeks    Status New    Target Date 06/14/21               PT Long Term Goals - 05/22/21 1247       PT LONG TERM GOAL #1   Title Pt will have a decrease in R shoulder pain to 3/10 or less at worst to promote ability to reach, open and close the door, lift items more comfortably.    Baseline 10/10 R shoulder pain at worst for the past 2 months (05/22/2021)    Time 8    Period Weeks    Status New    Target Date 07/19/21      PT LONG TERM GOAL #2   Title Pt will improve R shoulder flexion AROM to  150 degrees or more without pain to promote ability to reach.    Baseline 90 degrees R shoulder flexion AROM with pain (05/22/2021)    Time 8    Period Weeks    Status New    Target Date 07/19/21      PT LONG TERM GOAL #3   Title Pt will improve her R shoulder strength all planes by at least 1/2 MMT grade to promote ability to reach as well as lift items.    Baseline R shoulder flexion 4/5, abduction 4-/5, ER 4+/5, IR 4/5 (05/22/2021)    Time 8    Period Weeks    Status New    Target Date 07/19/21      PT LONG TERM GOAL #4   Title Pt will improve R shoulder FOTO score by at least 10 points as a demonstration of improved function.    Baseline R shoulder FOTO 49 (05/22/2021)    Time 8    Period Weeks    Status New    Target Date 07/19/21                    Plan - 05/22/21 1232     Clinical Impression Statement Pt is a 70 year old female who came to physical therapy secondary to R shoulder pain and history of falling. She also presents with altered posture, painful R shoulder AROM, R UE weakness, TTP, irritable symptoms, R  clavicle more prominent compared to L, and difficulty performing tasks with R UE such as opening and closing doors, raising her arm, lifting items secondary to pain. Pt will benefit from skilled physical therapy services to address the aforementioned deficits.    Personal Factors and Comorbidities Age;Comorbidity 2    Comorbidities Arthritis, Mitral valve prolapse    Examination-Activity Limitations Lift;Reach Overhead;Carry;Bed Mobility    Stability/Clinical Decision Making Stable/Uncomplicated    Clinical Decision Making Low    Rehab Potential Fair    PT Frequency 2x / week    PT Duration 8 weeks    PT Treatment/Interventions Therapeutic activities;Therapeutic exercise;Manual techniques;Electrical Stimulation;Iontophoresis 4mg /ml Dexamethasone;Functional mobility training;Neuromuscular re-education;Patient/family education;Dry needling    PT Next Visit Plan scapular strengthening, gentle strengthening, stability, manual techniques, modalities PRN    PT Home Exercise Plan Medbridge Access Code [email or web address]    Consulted and Agree with Plan of Care Patient             Patient will benefit from skilled therapeutic intervention in order to improve the following deficits and impairments:  Pain, Improper body mechanics, Postural dysfunction, Decreased strength  Visit Diagnosis: Chronic right shoulder pain - Plan: PT plan of care cert/re-cert  Muscle weakness (generalized) - Plan: PT plan of care cert/re-cert     Problem List Patient Active Problem List   Diagnosis Date Noted   Hematoma 02/11/2021   Ankle swelling 11/26/2020   Low back pain 11/22/2020   Hip pain, right 11/22/2020   Light headedness 06/24/2020   Hearing loss 06/24/2020   Change in vision 06/24/2020   Stress 05/17/2020   Chest pain 05/16/2020   Welcome to Medicare preventive visit 06/20/2019   Viral syndrome 01/30/2019   Itching 07/21/2017   Asthma 07/16/2016   Urinary incontinence 07/16/2016   SOB (shortness of  breath) 04/01/2016   Bilateral shoulder pain 02/24/2016   Fatigue 01/28/2016   Neck fullness 01/28/2016   Routine general medical examination at a health care facility 07/25/2015   Health care maintenance 10/09/2014   Neck pain 11/26/2013  Hypercholesterolemia 11/26/2013   History of colonic polyps 05/11/2013   Hyperbilirubinemia 01/18/2013   GERD (gastroesophageal reflux disease) 12/03/2012   Cholelithiasis 11/07/2012   IBS (irritable bowel syndrome) 11/07/2012   History of rheumatic fever 08/28/2012   MVP (mitral valve prolapse) 08/28/2012    Joneen Boers PT, DPT   05/22/2021, 1:04 PM  [facility] [address], [place], [address] Phone: [phone]   Fax:  [phone]  Name: Lynn Mann MRN: [id number] Date of Birth: 1950/11/11

## 2021-05-24 ENCOUNTER — Ambulatory Visit: Payer: PPO

## 2021-05-24 DIAGNOSIS — G8929 Other chronic pain: Secondary | ICD-10-CM

## 2021-05-24 DIAGNOSIS — M6281 Muscle weakness (generalized): Secondary | ICD-10-CM

## 2021-05-24 DIAGNOSIS — M25511 Pain in right shoulder: Secondary | ICD-10-CM | POA: Diagnosis not present

## 2021-05-24 NOTE — Therapy (Signed)
[facility] [address] [place], [address] Phone: [phone]   Fax:  [phone]  Physical Therapy Treatment  Patient Details  Name: Lynn Mann MRN: [id number] Date of Birth: 1950-09-21 Referring Provider (PT): Rosalia Hammers, DO   Encounter Date: 05/24/2021   PT End of Session - 05/24/21 1507     Visit Number 2    Number of Visits 17    Date for PT Re-Evaluation 07/19/21    Authorization Type 2    Authorization Time Period 10    PT Start Time 1507    PT Stop Time 7510    PT Time Calculation (min) 47 min    Activity Tolerance Patient tolerated treatment well    Behavior During Therapy [facility] for tasks assessed/performed             Past Medical History:  Diagnosis Date   Arthritis    C. difficile diarrhea    age 43s-40s    Chicken pox    Cholecystitis 11/2011   Did not require sgy - Dr. Staci Acosta - [facility]  (cholelithiasis)   H/O Clostridium difficile infection    IBS (irritable bowel syndrome)    MRSA exposure 2005   Spider bite   MVP (mitral valve prolapse)    Stable - Dr. Ubaldo Glassing   Rheumatic fever     Past Surgical History:  Procedure Laterality Date   CATARACT EXTRACTION  [id number]   MUSCLE BIOPSY      There were no vitals filed for this visit.   Subjective Assessment - 05/24/21 1508     Subjective R shoulder hurts when she retracts, it hurts. At rest, R shoulder pain feels like 1/10, when resting from exercise, pain is at least 4/10.    Pertinent History R shoulder did not start really hurting until about a month after her fall. Just started with a little pain at the [facility] joint and clavicle. Sometimes she also feels pain along her R pectoralis muscle in the shape of a triangle. Pt fell June 2022. Pt got up to go to the bathroom and tripped over a suitcase. Pt fell against the garment rack on her R side which kind of shielded her R side. Pt also got a hematoma the size of a baked  potato at her L leg afterwards. Pt hurt R toes during the fall which got better after 3 days. Went to urgent care for her leg. Hematoma has improved but still has a tiny knot in her L leg. Pt is R hand dominant. Pt was in PT around May to July 2022 for for her R hip which is better.    Patient Stated Goals Not hurt.    Currently in Pain? Yes    Pain Score 4     Pain Onset More than a month ago                                        PT Education - 05/24/21 1606     Education Details ther-ex    Person(s) Educated Patient    Methods Explanation;Demonstration;Tactile cues;Verbal cues    Comprehension Returned demonstration;Verbalized understanding            Objective     No blood pressure problems No latex allergies        R shoulder did not start really hurting until  about a month after her fall. Just started with a little pain at the [facility] joint and clavicle. Sometimes she also feels pain along her R pectoralis muscle in the shape of a triangle. Pt fell June 2022. Pt got up to go to the bathroom and tripped over a suitcase. Pt fell against the garment rack on her R side which kind of shielded her R side. Pt also got a hematoma the size of a baked potato at her L leg afterwards. Pt hurt R toes during the fall which got better after 3 days. Went to urgent care for her leg. Hematoma has improved but still has a tiny knot in her L leg. Pt is R hand dominant. Pt was in PT around May to July 2022 for for her R hip which is better.    TTP R [place] joint, sternal head on R slightly more palpable than L, TTP R calvicle and AC joint. Muscle tension R scalene area, upper trap muscle area.      Medbridge Access Code [email or web address]   Has osteopenia   Therapeutic exercise  Seated chin tucks 10x3  Manually resisted R shoulder isometrics  Extension/adduction 10x5 seconds for 3 sets  Improved proximal clavicle position palpated   Seated manually resisted scapular  retraction isometrics in neutral 10x2 with 5 second holds   Improved exercise technique, movement at target joints, use of target muscles after mod verbal, visual, tactile cues.          Manual therapy Seated STM R pectoralis to decrease fascial restrictions   and distal scalene muscle area to decrease tension    Seated STM R and L upper trap muscles to decrease tension and fascial restrictions     Seated STM R cervical paraspinal muscle tension       Response to treatment Pt tolerated session well without aggravation of symptoms.    Clinical impression Worked on decreasing soft tissue restrictions R pectoralis as well as upper trap muscle to promote movement. Worked on gentle scapular and posterior shoulder muscle activation to promote better posterior position of proximal clavicular head; good posterior translation palpated during exercises. Pt tolerated session well without aggravation of symptoms. Pt will benefit from continued skilled physical therapy services to decrease pain, improve strength and function.           PT Short Term Goals - 05/22/21 1245       PT SHORT TERM GOAL #1   Title Pt will be independent with her initial HEP to decrease pain, improve strength, function, and ability to reach, push, and pull more comfortably.    Baseline Pt has started her HEP (05/22/2021)    Time 3    Period Weeks    Status New    Target Date 06/14/21               PT Long Term Goals - 05/22/21 1247       PT LONG TERM GOAL #1   Title Pt will have a decrease in R shoulder pain to 3/10 or less at worst to promote ability to reach, open and close the door, lift items more comfortably.    Baseline 10/10 R shoulder pain at worst for the past 2 months (05/22/2021)    Time 8    Period Weeks    Status New    Target Date 07/19/21      PT LONG TERM GOAL #2   Title Pt will improve R shoulder flexion AROM to 150 degrees or  more without pain to promote ability to reach.     Baseline 90 degrees R shoulder flexion AROM with pain (05/22/2021)    Time 8    Period Weeks    Status New    Target Date 07/19/21      PT LONG TERM GOAL #3   Title Pt will improve her R shoulder strength all planes by at least 1/2 MMT grade to promote ability to reach as well as lift items.    Baseline R shoulder flexion 4/5, abduction 4-/5, ER 4+/5, IR 4/5 (05/22/2021)    Time 8    Period Weeks    Status New    Target Date 07/19/21      PT LONG TERM GOAL #4   Title Pt will improve R shoulder FOTO score by at least 10 points as a demonstration of improved function.    Baseline R shoulder FOTO 49 (05/22/2021)    Time 8    Period Weeks    Status New    Target Date 07/19/21                   Plan - 05/24/21 1604     Clinical Impression Statement Worked on decreasing soft tissue restrictions R pectoralis as well as upper trap muscle to promote movement. Worked on gentle scapular and posterior shoulder muscle activation to promote better posterior position of proximal clavicular head; good posterior translation palpated during exercises. Pt tolerated session well without aggravation of symptoms. Pt will benefit from continued skilled physical therapy services to decrease pain, improve strength and function.    Personal Factors and Comorbidities Age;Comorbidity 2    Comorbidities Arthritis, Mitral valve prolapse    Examination-Activity Limitations Lift;Reach Overhead;Carry;Bed Mobility    Stability/Clinical Decision Making Stable/Uncomplicated    Clinical Decision Making Low    Rehab Potential Fair    PT Frequency 2x / week    PT Duration 8 weeks    PT Treatment/Interventions Therapeutic activities;Therapeutic exercise;Manual techniques;Electrical Stimulation;Iontophoresis 4mg /ml Dexamethasone;Functional mobility training;Neuromuscular re-education;Patient/family education;Dry needling    PT Next Visit Plan scapular strengthening, gentle strengthening, stability, manual  techniques, modalities PRN    PT Home Exercise Plan Medbridge Access Code [email or web address]    Consulted and Agree with Plan of Care Patient             Patient will benefit from skilled therapeutic intervention in order to improve the following deficits and impairments:  Pain, Improper body mechanics, Postural dysfunction, Decreased strength  Visit Diagnosis: Chronic right shoulder pain  Muscle weakness (generalized)     Problem List Patient Active Problem List   Diagnosis Date Noted   Hematoma 02/11/2021   Ankle swelling 11/26/2020   Low back pain 11/22/2020   Hip pain, right 11/22/2020   Light headedness 06/24/2020   Hearing loss 06/24/2020   Change in vision 06/24/2020   Stress 05/17/2020   Chest pain 05/16/2020   Welcome to Medicare preventive visit 06/20/2019   Viral syndrome 01/30/2019   Itching 07/21/2017   Asthma 07/16/2016   Urinary incontinence 07/16/2016   SOB (shortness of breath) 04/01/2016   Bilateral shoulder pain 02/24/2016   Fatigue 01/28/2016   Neck fullness 01/28/2016   Routine general medical examination at a health care facility 07/25/2015   Health care maintenance 10/09/2014   Neck pain 11/26/2013   Hypercholesterolemia 11/26/2013   History of colonic polyps 05/11/2013   Hyperbilirubinemia 01/18/2013   GERD (gastroesophageal reflux disease) 12/03/2012   Cholelithiasis 11/07/2012   IBS (irritable bowel syndrome)  11/07/2012   History of rheumatic fever 08/28/2012   MVP (mitral valve prolapse) 08/28/2012    Joneen Boers PT, DPT   05/24/2021, 4:07 PM  [facility] [address] [place], [address] Phone: [phone]   Fax:  [phone]  Name: Lynn Mann MRN: [id number] Date of Birth: September 07, 1950

## 2021-05-29 ENCOUNTER — Ambulatory Visit: Payer: PPO | Attending: Sports Medicine

## 2021-05-29 DIAGNOSIS — M25511 Pain in right shoulder: Secondary | ICD-10-CM | POA: Diagnosis not present

## 2021-05-29 DIAGNOSIS — M6281 Muscle weakness (generalized): Secondary | ICD-10-CM | POA: Insufficient documentation

## 2021-05-29 DIAGNOSIS — G8929 Other chronic pain: Secondary | ICD-10-CM | POA: Insufficient documentation

## 2021-05-29 NOTE — Patient Instructions (Signed)
Held off the scapular retraction HEP for now.

## 2021-05-29 NOTE — Therapy (Signed)
[facility] [address] [place], [address] Phone: [phone]   Fax:  [phone]  Physical Therapy Treatment  Patient Details  Name: Lynn Mann MRN: [id number] Date of Birth: 03/26/1951 Referring Provider (PT): Rosalia Hammers, DO   Encounter Date: 05/29/2021   PT End of Session - 05/29/21 1416     Visit Number 3    Number of Visits 17    Date for PT Re-Evaluation 07/19/21    Authorization Type 3    Authorization Time Period 10    PT Start Time 1417    PT Stop Time 1501    PT Time Calculation (min) 44 min    Activity Tolerance Patient tolerated treatment well    Behavior During Therapy [facility] for tasks assessed/performed             Past Medical History:  Diagnosis Date   Arthritis    C. difficile diarrhea    age 51s-40s    Chicken pox    Cholecystitis 11/2011   Did not require sgy - Dr. Staci Acosta - [facility]  (cholelithiasis)   H/O Clostridium difficile infection    IBS (irritable bowel syndrome)    MRSA exposure 2005   Spider bite   MVP (mitral valve prolapse)    Stable - Dr. Ubaldo Glassing   Rheumatic fever     Past Surgical History:  Procedure Laterality Date   CATARACT EXTRACTION  [id number]   MUSCLE BIOPSY      There were no vitals filed for this visit.   Subjective Assessment - 05/29/21 1418     Subjective R shoulder has been hanging out at around 3/10 currently. Hurts when she tries to do the shoulder blade squeezes.    Pertinent History R shoulder did not start really hurting until about a month after her fall. Just started with a little pain at the [facility] joint and clavicle. Sometimes she also feels pain along her R pectoralis muscle in the shape of a triangle. Pt fell June 2022. Pt got up to go to the bathroom and tripped over a suitcase. Pt fell against the garment rack on her R side which kind of shielded her R side. Pt also got a hematoma the size of a baked potato at her L leg  afterwards. Pt hurt R toes during the fall which got better after 3 days. Went to urgent care for her leg. Hematoma has improved but still has a tiny knot in her L leg. Pt is R hand dominant. Pt was in PT around May to July 2022 for for her R hip which is better.    Patient Stated Goals Not hurt.    Currently in Pain? Yes    Pain Onset More than a month ago                                        PT Education - 05/29/21 1507     Education Details ther-ex, HEP    Person(s) Educated Patient    Methods Explanation;Demonstration;Tactile cues;Verbal cues;Handout    Comprehension Verbalized understanding;Returned demonstration              Objective      No blood pressure problems No latex allergies        R shoulder did not start really hurting until about a month after her fall. Just started  with a little pain at the [facility] joint and clavicle. Sometimes she also feels pain along her R pectoralis muscle in the shape of a triangle. Pt fell June 2022. Pt got up to go to the bathroom and tripped over a suitcase. Pt fell against the garment rack on her R side which kind of shielded her R side. Pt also got a hematoma the size of a baked potato at her L leg afterwards. Pt hurt R toes during the fall which got better after 3 days. Went to urgent care for her leg. Hematoma has improved but still has a tiny knot in her L leg. Pt is R hand dominant. Pt was in PT around May to July 2022 for for her R hip which is better.    TTP R [place] joint, sternal head on R slightly more palpable than L, TTP R calvicle and AC joint. Muscle tension R scalene area, upper trap muscle area.      Medbridge Access Code [email or web address]   Has osteopenia   Therapeutic exercise  Held off scapular retraction home exercise secondary to reports of discomfort. Pt verbalized understanding.   Seated L cervical side bend to stretch R side 10 seconds x 5   Seated chin tucks 10x with 5 second holds, then  7x5 seconds   Seated R SCM stretch 10 seconds x 3 for 2 sets  Decreased pain  Pt education on decreasing sternocleidomastoid muscle tension on R proximal clavicle.   Time spent on answering pt questions.      Improved exercise technique, movement at target joints, use of target muscles after mod verbal, visual, tactile cues.      Manual therapy  Seated STM R pectoralis to decrease muscle tension    Seated STM L and R upper trap muscles to decrease muscle tension       Response to treatment Pt tolerated session well without aggravation of symptoms.    Clinical impression Decreased R clavicular pain with decreasing sternocleidomastoid muscle tension. Held off scapular retraction exercises secondary to reports of pain. Pt tolerated session well without aggravation of symptoms. Pt will benefit from continued skilled physical therapy services to decrease pain, improve strength and function.         PT Short Term Goals - 05/22/21 1245       PT SHORT TERM GOAL #1   Title Pt will be independent with her initial HEP to decrease pain, improve strength, function, and ability to reach, push, and pull more comfortably.    Baseline Pt has started her HEP (05/22/2021)    Time 3    Period Weeks    Status New    Target Date 06/14/21               PT Long Term Goals - 05/22/21 1247       PT LONG TERM GOAL #1   Title Pt will have a decrease in R shoulder pain to 3/10 or less at worst to promote ability to reach, open and close the door, lift items more comfortably.    Baseline 10/10 R shoulder pain at worst for the past 2 months (05/22/2021)    Time 8    Period Weeks    Status New    Target Date 07/19/21      PT LONG TERM GOAL #2   Title Pt will improve R shoulder flexion AROM to 150 degrees or more without pain to promote ability to reach.    Baseline 90 degrees R  shoulder flexion AROM with pain (05/22/2021)    Time 8    Period Weeks    Status New    Target Date  07/19/21      PT LONG TERM GOAL #3   Title Pt will improve her R shoulder strength all planes by at least 1/2 MMT grade to promote ability to reach as well as lift items.    Baseline R shoulder flexion 4/5, abduction 4-/5, ER 4+/5, IR 4/5 (05/22/2021)    Time 8    Period Weeks    Status New    Target Date 07/19/21      PT LONG TERM GOAL #4   Title Pt will improve R shoulder FOTO score by at least 10 points as a demonstration of improved function.    Baseline R shoulder FOTO 49 (05/22/2021)    Time 8    Period Weeks    Status New    Target Date 07/19/21                   Plan - 05/29/21 1520     Clinical Impression Statement Decreased R clavicular pain with decreasing sternocleidomastoid muscle tension. Held off scapular retraction exercises secondary to reports of pain. Pt tolerated session well without aggravation of symptoms. Pt will benefit from continued skilled physical therapy services to decrease pain, improve strength and function.    Personal Factors and Comorbidities Age;Comorbidity 2    Comorbidities Arthritis, Mitral valve prolapse    Examination-Activity Limitations Lift;Reach Overhead;Carry;Bed Mobility    Stability/Clinical Decision Making Stable/Uncomplicated    Clinical Decision Making Low    Rehab Potential Fair    PT Frequency 2x / week    PT Duration 8 weeks    PT Treatment/Interventions Therapeutic activities;Therapeutic exercise;Manual techniques;Electrical Stimulation;Iontophoresis 4mg /ml Dexamethasone;Functional mobility training;Neuromuscular re-education;Patient/family education;Dry needling    PT Next Visit Plan scapular strengthening, gentle strengthening, stability, manual techniques, modalities PRN    PT Home Exercise Plan Medbridge Access Code [email or web address]    Consulted and Agree with Plan of Care Patient             Patient will benefit from skilled therapeutic intervention in order to improve the following deficits and impairments:  Pain,  Improper body mechanics, Postural dysfunction, Decreased strength  Visit Diagnosis: Chronic right shoulder pain  Muscle weakness (generalized)     Problem List Patient Active Problem List   Diagnosis Date Noted   Hematoma 02/11/2021   Ankle swelling 11/26/2020   Low back pain 11/22/2020   Hip pain, right 11/22/2020   Light headedness 06/24/2020   Hearing loss 06/24/2020   Change in vision 06/24/2020   Stress 05/17/2020   Chest pain 05/16/2020   Welcome to Medicare preventive visit 06/20/2019   Viral syndrome 01/30/2019   Itching 07/21/2017   Asthma 07/16/2016   Urinary incontinence 07/16/2016   SOB (shortness of breath) 04/01/2016   Bilateral shoulder pain 02/24/2016   Fatigue 01/28/2016   Neck fullness 01/28/2016   Routine general medical examination at a health care facility 07/25/2015   Health care maintenance 10/09/2014   Neck pain 11/26/2013   Hypercholesterolemia 11/26/2013   History of colonic polyps 05/11/2013   Hyperbilirubinemia 01/18/2013   GERD (gastroesophageal reflux disease) 12/03/2012   Cholelithiasis 11/07/2012   IBS (irritable bowel syndrome) 11/07/2012   History of rheumatic fever 08/28/2012   MVP (mitral valve prolapse) 08/28/2012    Joneen Boers PT, DPT   05/29/2021, 3:21 PM  [facility] [address] [place], [address] Phone: [phone]   Fax:  [phone]  Name: MICHEALE SCHLACK MRN: [id number] Date of Birth: Oct 14, 1950

## 2021-05-31 ENCOUNTER — Ambulatory Visit: Payer: PPO

## 2021-05-31 DIAGNOSIS — M25511 Pain in right shoulder: Secondary | ICD-10-CM | POA: Diagnosis not present

## 2021-05-31 DIAGNOSIS — M6281 Muscle weakness (generalized): Secondary | ICD-10-CM

## 2021-05-31 DIAGNOSIS — G8929 Other chronic pain: Secondary | ICD-10-CM

## 2021-05-31 NOTE — Therapy (Signed)
[facility] [address], [place], [address] Phone: [phone]   Fax:  [phone]  Physical Therapy Treatment  Patient Details  Name: Lynn Mann MRN: [id number] Date of Birth: [birth year] Referring Provider (PT): Rosalia Hammers, DO   Encounter Date: 05/31/2021   PT End of Session - 05/31/21 1427     Visit Number 4    Number of Visits 17    Date for PT Re-Evaluation 07/19/21    Authorization Type 4    Authorization Time Period 10    PT Start Time 1428    PT Stop Time 1501    PT Time Calculation (min) 33 min    Activity Tolerance Patient tolerated treatment well    Behavior During Therapy [facility] for tasks assessed/performed             Past Medical History:  Diagnosis Date   Arthritis    C. difficile diarrhea    age 70s-40s    Chicken pox    Cholecystitis 11/2011   Did not require sgy - Dr. Staci Acosta - [facility]  (cholelithiasis)   H/O Clostridium difficile infection    IBS (irritable bowel syndrome)    MRSA exposure 2005   Spider bite   MVP (mitral valve prolapse)    Stable - Dr. Ubaldo Glassing   Rheumatic fever     Past Surgical History:  Procedure Laterality Date   CATARACT EXTRACTION  [id number]   MUSCLE BIOPSY      There were no vitals filed for this visit.   Subjective Assessment - 05/31/21 1429     Subjective R shoulder is doing ok. Hurts for a little while but straightens out fairly shortly. Hung up some clothes, hurt but not too bad. No pain at rest currently. Doing the SCM stretch at home.    Pertinent History R shoulder did not start really hurting until about a month after her fall. Just started with a little pain at the [facility] joint and clavicle. Sometimes she also feels pain along her R pectoralis muscle in the shape of a triangle. Pt fell June 2022. Pt got up to go to the bathroom and tripped over a suitcase. Pt fell against the garment rack on her R side which kind of shielded her R side.  Pt also got a hematoma the size of a baked potato at her L leg afterwards. Pt hurt R toes during the fall which got better after 3 days. Went to urgent care for her leg. Hematoma has improved but still has a tiny knot in her L leg. Pt is R hand dominant. Pt was in PT around May to July 2022 for for her R hip which is better.    Patient Stated Goals Not hurt.    Currently in Pain? No/denies    Pain Onset More than a month ago                                        PT Education - 05/31/21 1657     Education Details ther-ex    Person(s) Educated Patient    Methods Explanation;Demonstration;Tactile cues;Verbal cues    Comprehension Returned demonstration;Verbalized understanding            Objective      No blood pressure problems No latex allergies      TTP R [place] joint, sternal head  on R slightly more palpable than L, TTP R calvicle and AC joint. Muscle tension R scalene area, upper trap muscle area.      Medbridge Access Code [email or web address]   Has osteopenia   Therapeutic exercise     Seated manually resisted scapular retraction isometrics in neutral 10x3 with 5 second holds   Manually resisted R shoulder isometrics             Extension/adduction 10x5 seconds for 3 sets             Improved proximal clavicle position palpated    Seated L cervical side bend to stretch R side 10 seconds x 5   Standing R pectoralis stretch 10 seconds x 4. R clavicle discomfort.   R SCM stretch 10 seconds x 4  Decreased symptoms.      Improved exercise technique, movement at target joints, use of target muscles after min to mod verbal, visual, tactile cues.      Manual therapy   Seated STM L and R upper trap muscles to decrease muscle tension       Improved upper trap muscle comfort level reported     Response to treatment Pt tolerated session well without aggravation of symptoms.    Clinical impression Pt returns to PT without complain of pain.  Continued working on decreasing muscle tension to R clavicle as well as worked on isometric scapular and shoulder strength to promote better positioning at the sternoclavicular joint. Pt tolerated session well without aggravation of symptoms. Pt will benefit from continued skilled physical therapy services to decrease pain, improve strength and function.     PT Short Term Goals - 05/22/21 1245       PT SHORT TERM GOAL #1   Title Pt will be independent with her initial HEP to decrease pain, improve strength, function, and ability to reach, push, and pull more comfortably.    Baseline Pt has started her HEP (05/22/2021)    Time 3    Period Weeks    Status New    Target Date 06/14/21               PT Long Term Goals - 05/22/21 1247       PT LONG TERM GOAL #1   Title Pt will have a decrease in R shoulder pain to 3/10 or less at worst to promote ability to reach, open and close the door, lift items more comfortably.    Baseline 10/10 R shoulder pain at worst for the past 2 months (05/22/2021)    Time 8    Period Weeks    Status New    Target Date 07/19/21      PT LONG TERM GOAL #2   Title Pt will improve R shoulder flexion AROM to 150 degrees or more without pain to promote ability to reach.    Baseline 90 degrees R shoulder flexion AROM with pain (05/22/2021)    Time 8    Period Weeks    Status New    Target Date 07/19/21      PT LONG TERM GOAL #3   Title Pt will improve her R shoulder strength all planes by at least 1/2 MMT grade to promote ability to reach as well as lift items.    Baseline R shoulder flexion 4/5, abduction 4-/5, ER 4+/5, IR 4/5 (05/22/2021)    Time 8    Period Weeks    Status New    Target Date 07/19/21  PT LONG TERM GOAL #4   Title Pt will improve R shoulder FOTO score by at least 10 points as a demonstration of improved function.    Baseline R shoulder FOTO 49 (05/22/2021)    Time 8    Period Weeks    Status New    Target Date 07/19/21                    Plan - 05/31/21 1657     Clinical Impression Statement Pt returns to PT without complain of pain. Continued working on decreasing muscle tension to R clavicle as well as worked on isometric scapular and shoulder strength to promote better positioning at the sternoclavicular joint. Pt tolerated session well without aggravation of symptoms. Pt will benefit from continued skilled physical therapy services to decrease pain, improve strength and function.    Personal Factors and Comorbidities Age;Comorbidity 2    Comorbidities Arthritis, Mitral valve prolapse    Examination-Activity Limitations Lift;Reach Overhead;Carry;Bed Mobility    Stability/Clinical Decision Making Stable/Uncomplicated    Clinical Decision Making Low    Rehab Potential Fair    PT Frequency 2x / week    PT Duration 8 weeks    PT Treatment/Interventions Therapeutic activities;Therapeutic exercise;Manual techniques;Electrical Stimulation;Iontophoresis 4mg /ml Dexamethasone;Functional mobility training;Neuromuscular re-education;Patient/family education;Dry needling    PT Next Visit Plan scapular strengthening, gentle strengthening, stability, manual techniques, modalities PRN    PT Home Exercise Plan Medbridge Access Code [email or web address]    Consulted and Agree with Plan of Care Patient             Patient will benefit from skilled therapeutic intervention in order to improve the following deficits and impairments:  Pain, Improper body mechanics, Postural dysfunction, Decreased strength  Visit Diagnosis: Chronic right shoulder pain  Muscle weakness (generalized)     Problem List Patient Active Problem List   Diagnosis Date Noted   Hematoma 02/11/2021   Ankle swelling 11/26/2020   Low back pain 11/22/2020   Hip pain, right 11/22/2020   Light headedness 06/24/2020   Hearing loss 06/24/2020   Change in vision 06/24/2020   Stress 05/17/2020   Chest pain 05/16/2020   Welcome to Medicare preventive  visit 06/20/2019   Viral syndrome 01/30/2019   Itching 07/21/2017   Asthma 07/16/2016   Urinary incontinence 07/16/2016   SOB (shortness of breath) 04/01/2016   Bilateral shoulder pain 02/24/2016   Fatigue 01/28/2016   Neck fullness 01/28/2016   Routine general medical examination at a health care facility 07/25/2015   Health care maintenance 10/09/2014   Neck pain 11/26/2013   Hypercholesterolemia 11/26/2013   History of colonic polyps 05/11/2013   Hyperbilirubinemia 01/18/2013   GERD (gastroesophageal reflux disease) 12/03/2012   Cholelithiasis 11/07/2012   IBS (irritable bowel syndrome) 11/07/2012   History of rheumatic fever 08/28/2012   MVP (mitral valve prolapse) 08/28/2012    Joneen Boers PT, DPT   05/31/2021, 5:02 PM  [facility] [address] [place], [address] Phone: [phone]   Fax:  [phone]  Name: Lynn Mann MRN: [id number] Date of Birth: [birth year]

## 2021-06-04 ENCOUNTER — Ambulatory Visit: Payer: PPO

## 2021-06-04 DIAGNOSIS — M25511 Pain in right shoulder: Secondary | ICD-10-CM | POA: Diagnosis not present

## 2021-06-04 DIAGNOSIS — G8929 Other chronic pain: Secondary | ICD-10-CM

## 2021-06-04 DIAGNOSIS — M6281 Muscle weakness (generalized): Secondary | ICD-10-CM

## 2021-06-04 NOTE — Therapy (Signed)
[facility] [address] [place], [address] Phone: [phone]   Fax:  [phone]  Physical Therapy Treatment  Patient Details  Name: Lynn Mann MRN: [id number] Date of Birth: April 14, 1951 Referring Provider (PT): Rosalia Hammers, DO   Encounter Date: 06/04/2021   PT End of Session - 06/04/21 1104     Visit Number 5    Number of Visits 17    Date for PT Re-Evaluation 07/19/21    Authorization Type 5    Authorization Time Period 10    PT Start Time 1104    PT Stop Time 1145    PT Time Calculation (min) 41 min    Activity Tolerance Patient tolerated treatment well    Behavior During Therapy [facility] for tasks assessed/performed             Past Medical History:  Diagnosis Date   Arthritis    C. difficile diarrhea    age 39s-40s    Chicken pox    Cholecystitis 11/2011   Did not require sgy - Dr. Staci Acosta - [facility]  (cholelithiasis)   H/O Clostridium difficile infection    IBS (irritable bowel syndrome)    MRSA exposure 2005   Spider bite   MVP (mitral valve prolapse)    Stable - Dr. Ubaldo Glassing   Rheumatic fever     Past Surgical History:  Procedure Laterality Date   CATARACT EXTRACTION  [id number]   MUSCLE BIOPSY      There were no vitals filed for this visit.   Subjective Assessment - 06/04/21 1105     Subjective R shoulder area is doing ok. No pain currently. Has not done a lot of high reaching today.    Pertinent History R shoulder did not start really hurting until about a month after her fall. Just started with a little pain at the [employer] joint and clavicle. Sometimes she also feels pain along her R pectoralis muscle in the shape of a triangle. Pt fell June 2022. Pt got up to go to the bathroom and tripped over a suitcase. Pt fell against the garment rack on her R side which kind of shielded her R side. Pt also got a hematoma the size of a baked potato at her L leg afterwards. Pt hurt R toes during  the fall which got better after 3 days. Went to urgent care for her leg. Hematoma has improved but still has a tiny knot in her L leg. Pt is R hand dominant. Pt was in PT around May to July 2022 for for her R hip which is better.    Patient Stated Goals Not hurt.    Currently in Pain? No/denies    Pain Onset More than a month ago                                        PT Education - 06/04/21 1124     Education Details ther-ex    [employer]) Educated Patient    Methods Explanation;Demonstration;Tactile cues;Verbal cues    Comprehension Returned demonstration;Verbalized understanding           Objective      No blood pressure problems No latex allergies      TTP R Gorman joint, sternal head on R slightly more palpable than L, TTP R calvicle and AC joint. Muscle tension R scalene area,  upper trap muscle area.      Medbridge Access Code [email or web address]   Has osteopenia   Therapeutic exercise     Seated manually resisted scapular retraction isometrics in neutral 10x3 with 5 second holds    Seated L cervical side bend to stretch R side 10 seconds x 5  Manually resisted R shoulder isometrics             Extension/adduction 10x5 seconds for 3 sets             Improved proximal clavicle position palpated      Seated AAROM  With tip of SPC on floor    Flexion 10x3   Scaption 10x3  Chin tucks 10x5 seconds for 3 sets  Seated R shoulder ER isometrics, PT manual resistance 10x3 with 5 second holds     Improved exercise technique, movement at target joints, use of target muscles after min to mod verbal, visual, tactile cues.      Manual therapy   Seated STM R proximal pectoralis muscle to decrease tension   Seated STM L and R upper trap muscles to decrease muscle tension                Improved upper trap muscle comfort level reported       Response to treatment Pt tolerated session well without aggravation of symptoms.    Clinical  impression Pt returns to 2nd straight PT visit without reports of starting pain. Continued working on decreasing muscle tension to R clavicle as well as isometric scapular and shoulder strength to promote better positioning at the sternoclavicular joint. Pt tolerated session well without aggravation of symptoms. Pt will benefit from continued skilled physical therapy services to decrease pain, improve strength and function.       PT Short Term Goals - 05/22/21 1245       PT SHORT TERM GOAL #1   Title Pt will be independent with her initial HEP to decrease pain, improve strength, function, and ability to reach, push, and pull more comfortably.    Baseline Pt has started her HEP (05/22/2021)    Time 3    Period Weeks    Status New    Target Date 06/14/21               PT Long Term Goals - 05/22/21 1247       PT LONG TERM GOAL #1   Title Pt will have a decrease in R shoulder pain to 3/10 or less at worst to promote ability to reach, open and close the door, lift items more comfortably.    Baseline 10/10 R shoulder pain at worst for the past 2 months (05/22/2021)    Time 8    Period Weeks    Status New    Target Date 07/19/21      PT LONG TERM GOAL #2   Title Pt will improve R shoulder flexion AROM to 150 degrees or more without pain to promote ability to reach.    Baseline 90 degrees R shoulder flexion AROM with pain (05/22/2021)    Time 8    Period Weeks    Status New    Target Date 07/19/21      PT LONG TERM GOAL #3   Title Pt will improve her R shoulder strength all planes by at least 1/2 MMT grade to promote ability to reach as well as lift items.    Baseline R shoulder flexion 4/5, abduction 4-/5, ER 4+/5, IR  4/5 (05/22/2021)    Time 8    Period Weeks    Status New    Target Date 07/19/21      PT LONG TERM GOAL #4   Title Pt will improve R shoulder FOTO score by at least 10 points as a demonstration of improved function.    Baseline R shoulder FOTO 49  (05/22/2021)    Time 8    Period Weeks    Status New    Target Date 07/19/21                   Plan - 06/04/21 1125     Clinical Impression Statement Pt returns to 2nd straight PT visit without reports of starting pain. Continued working on decreasing muscle tension to R clavicle as well as isometric scapular and shoulder strength to promote better positioning at the sternoclavicular joint. Pt tolerated session well without aggravation of symptoms. Pt will benefit from continued skilled physical therapy services to decrease pain, improve strength and function.    Personal Factors and Comorbidities Age;Comorbidity 2    Comorbidities Arthritis, Mitral valve prolapse    Examination-Activity Limitations Lift;Reach Overhead;Carry;Bed Mobility    Stability/Clinical Decision Making Stable/Uncomplicated    Rehab Potential Fair    PT Frequency 2x / week    PT Duration 8 weeks    PT Treatment/Interventions Therapeutic activities;Therapeutic exercise;Manual techniques;Electrical Stimulation;Iontophoresis 4mg /ml Dexamethasone;Functional mobility training;Neuromuscular re-education;Patient/family education;Dry needling    PT Next Visit Plan scapular strengthening, gentle strengthening, stability, manual techniques, modalities PRN    PT Home Exercise Plan Medbridge Access Code [email or web address]    Consulted and Agree with Plan of Care Patient             Patient will benefit from skilled therapeutic intervention in order to improve the following deficits and impairments:  Pain, Improper body mechanics, Postural dysfunction, Decreased strength  Visit Diagnosis: Chronic right shoulder pain  Muscle weakness (generalized)     Problem List Patient Active Problem List   Diagnosis Date Noted   Hematoma 02/11/2021   Ankle swelling 11/26/2020   Low back pain 11/22/2020   Hip pain, right 11/22/2020   Light headedness 06/24/2020   Hearing loss 06/24/2020   Change in vision 06/24/2020   Stress  05/17/2020   Chest pain 05/16/2020   Welcome to Medicare preventive visit 06/20/2019   Viral syndrome 01/30/2019   Itching 07/21/2017   Asthma 07/16/2016   Urinary incontinence 07/16/2016   SOB (shortness of breath) 04/01/2016   Bilateral shoulder pain 02/24/2016   Fatigue 01/28/2016   Neck fullness 01/28/2016   Routine general medical examination at a health care facility 07/25/2015   Health care maintenance 10/09/2014   Neck pain 11/26/2013   Hypercholesterolemia 11/26/2013   History of colonic polyps 05/11/2013   Hyperbilirubinemia 01/18/2013   GERD (gastroesophageal reflux disease) 12/03/2012   Cholelithiasis 11/07/2012   IBS (irritable bowel syndrome) 11/07/2012   History of rheumatic fever 08/28/2012   MVP (mitral valve prolapse) 08/28/2012   Joneen Boers PT, DPT   06/04/2021, 5:00 PM  [facility] [address], [place], [address] Phone: [phone]   Fax:  [phone]  Name: CLYDETTE PRIVITERA MRN: [id number] Date of Birth: 1951-03-01

## 2021-06-05 ENCOUNTER — Telehealth: Payer: Self-pay | Admitting: Internal Medicine

## 2021-06-05 NOTE — Telephone Encounter (Signed)
Called patient to let her know that her appt is at 16 tomorrow and we can draw labs if needed. Also advised that we could have drawn labs prior to appt but with her appt being tomorrow morning, no fasting lab appt available. Patient will come fasting to appt tomorrow.

## 2021-06-05 NOTE — Telephone Encounter (Signed)
Pt called in inquiring about fast lab order. Pt stated that she didn't see a lab appointment and was wondering why Dr. Nicki Reaper didn't put any lab order in. Pt stated that Dr. Nicki Reaper always have her do lab work. Pt would like to know if she had to do lab work before her appointment. Pt would like callback after 3pm.

## 2021-06-05 NOTE — Telephone Encounter (Signed)
Patient called about here labs

## 2021-06-06 ENCOUNTER — Telehealth: Payer: Self-pay | Admitting: Internal Medicine

## 2021-06-06 ENCOUNTER — Encounter: Payer: Self-pay | Admitting: Internal Medicine

## 2021-06-06 ENCOUNTER — Other Ambulatory Visit: Payer: Self-pay

## 2021-06-06 ENCOUNTER — Ambulatory Visit (INDEPENDENT_AMBULATORY_CARE_PROVIDER_SITE_OTHER): Payer: PPO | Admitting: Internal Medicine

## 2021-06-06 VITALS — BP 124/78 | HR 84 | Temp 96.2°F | Ht 67.01 in | Wt 184.4 lb

## 2021-06-06 DIAGNOSIS — Z86010 Personal history of colonic polyps: Secondary | ICD-10-CM

## 2021-06-06 DIAGNOSIS — R0602 Shortness of breath: Secondary | ICD-10-CM

## 2021-06-06 DIAGNOSIS — E7800 Pure hypercholesterolemia, unspecified: Secondary | ICD-10-CM

## 2021-06-06 DIAGNOSIS — R109 Unspecified abdominal pain: Secondary | ICD-10-CM

## 2021-06-06 DIAGNOSIS — F439 Reaction to severe stress, unspecified: Secondary | ICD-10-CM

## 2021-06-06 DIAGNOSIS — J4520 Mild intermittent asthma, uncomplicated: Secondary | ICD-10-CM | POA: Diagnosis not present

## 2021-06-06 LAB — CBC WITH DIFFERENTIAL/PLATELET
Basophils Absolute: 0 10*3/uL (ref 0.0–0.1)
Basophils Relative: 0.6 % (ref 0.0–3.0)
Eosinophils Absolute: 0 10*3/uL (ref 0.0–0.7)
Eosinophils Relative: 0.2 % (ref 0.0–5.0)
HCT: 39.5 % (ref 36.0–46.0)
Hemoglobin: 13.1 g/dL (ref 12.0–15.0)
Lymphocytes Relative: 24.1 % (ref 12.0–46.0)
Lymphs Abs: 1.3 10*3/uL (ref 0.7–4.0)
MCHC: 33.1 g/dL (ref 30.0–36.0)
MCV: 88.1 fl (ref 78.0–100.0)
Monocytes Absolute: 0.4 10*3/uL (ref 0.1–1.0)
Monocytes Relative: 7.1 % (ref 3.0–12.0)
Neutro Abs: 3.8 10*3/uL (ref 1.4–7.7)
Neutrophils Relative %: 68 % (ref 43.0–77.0)
Platelets: 286 10*3/uL (ref 150.0–400.0)
RBC: 4.48 Mil/uL (ref 3.87–5.11)
RDW: 13.2 % (ref 11.5–15.5)
WBC: 5.5 10*3/uL (ref 4.0–10.5)

## 2021-06-06 LAB — URINALYSIS, ROUTINE W REFLEX MICROSCOPIC
Hgb urine dipstick: NEGATIVE
Ketones, ur: 15 — AB
Leukocytes,Ua: NEGATIVE
Nitrite: NEGATIVE
Specific Gravity, Urine: >=1.03 — AB (ref 1.000–1.030)
Total Protein, Urine: NEGATIVE
Urine Glucose: NEGATIVE
Urobilinogen, UA: 0.2 (ref 0.0–1.0)
pH: 5.5 (ref 5.0–8.0)

## 2021-06-06 LAB — LIPID PANEL
Cholesterol: 200 mg/dL (ref 0–200)
HDL: 39.9 mg/dL (ref >39.00)
LDL Cholesterol: 141 mg/dL — ABNORMAL HIGH (ref 0–99)
NonHDL: 159.88
Total CHOL/HDL Ratio: 5
Triglycerides: 92 mg/dL (ref 0.0–149.0)
VLDL: 18.4 mg/dL (ref 0.0–40.0)

## 2021-06-06 LAB — BASIC METABOLIC PANEL
BUN: 13 mg/dL (ref 6–23)
CO2: 27 mEq/L (ref 19–32)
Calcium: 9.3 mg/dL (ref 8.4–10.5)
Chloride: 105 mEq/L (ref 96–112)
Creatinine, Ser: 0.65 mg/dL (ref 0.40–1.20)
GFR: 90.27 mL/min (ref >60.00)
Glucose, Bld: 91 mg/dL (ref 70–99)
Potassium: 4.3 mEq/L (ref 3.5–5.1)
Sodium: 140 mEq/L (ref 135–145)

## 2021-06-06 LAB — HEPATIC FUNCTION PANEL
ALT: 16 U/L (ref 0–35)
AST: 24 U/L (ref 0–37)
Albumin: 4.3 g/dL (ref 3.5–5.2)
Alkaline Phosphatase: 62 U/L (ref 39–117)
Bilirubin, Direct: 0.2 mg/dL (ref 0.0–0.3)
Total Bilirubin: 1.4 mg/dL — ABNORMAL HIGH (ref 0.2–1.2)
Total Protein: 6.8 g/dL (ref 6.0–8.3)

## 2021-06-06 LAB — TSH: TSH: 1.96 u[IU]/mL (ref 0.35–5.50)

## 2021-06-06 NOTE — Assessment & Plan Note (Signed)
Colonoscopy 08/2017 - tubular adenoma.  Recommended f/u colonoscopy in 5 years.  

## 2021-06-06 NOTE — Progress Notes (Signed)
Patient ID: Lynn Mann, female   DOB: 1950/10/18, 70 y.o.   MRN: [id number]   Subjective:    Patient ID: Lynn Mann, female    DOB: 1951-06-13, 70 y.o.   MRN: [id number]  This visit occurred during the SARS-CoV-2 public health emergency.  Safety protocols were in place, including screening questions prior to the visit, additional usage of staff PPE, and extensive cleaning of exam room while observing appropriate contact time as indicated for disinfecting solutions.   Patient here for her physical exam.  Visit changed to f/u appt - she is due to see Dr Leafy Ro for physical.    HPI Here to follow up regarding increased stress, cholesterol and msk complaints.  Reports increased stress - states more "disorganized".  Discussed.  Going to PT for collar bone/shoulder pain.  Helping some.  No chest pain reported.  Does report still noticing sob with exertion.  She relates this to not exercising.  Has f/u with Dr Ubaldo Glassing next week.  Wants to hold on f/u EKG today.  No change in symptoms.  No acid reflux reported.  No bowel change reported.  Blood pressure ok.     Past Medical History:  Diagnosis Date   Arthritis    C. difficile diarrhea    age 6s-40s    Chicken pox    Cholecystitis 11/2011   Did not require sgy - Dr. Staci Acosta - [facility]  (cholelithiasis)   H/O Clostridium difficile infection    IBS (irritable bowel syndrome)    MRSA exposure 2005   Spider bite   MVP (mitral valve prolapse)    Stable - Dr. Ubaldo Glassing   Rheumatic fever    Past Surgical History:  Procedure Laterality Date   CATARACT EXTRACTION  [id number]   MUSCLE BIOPSY     Family History  Problem Relation Age of Onset   Arthritis Mother    Stroke Mother    Diabetes Mother    Hypertension Mother    Arthritis Father    Stroke Father    Diabetes Paternal Grandmother    Cancer Paternal Uncle        colon   Heart disease Other        maternal and paternal side   Breast cancer Paternal 64    Breast cancer Cousin     Breast cancer Cousin        female cousin   Breast cancer Cousin    Social History   Socioeconomic History   Marital status: Widowed    Spouse name: Not on file   Number of children: Not on file   Years of education: 16   Highest education level: Not on file  Occupational History   Occupation: Retired  Tobacco Use   Smoking status: Never   Smokeless tobacco: Never  Vaping Use   Vaping Use: Never used  Substance and Sexual Activity   Alcohol use: Yes    Alcohol/week: 0.0 standard drinks    Comment: Rarely   Drug use: No   Sexual activity: Yes    Partners: Male    Birth control/protection: Post-menopausal  Other Topics Concern   Not on file  Social History Narrative   Not on file   Social Determinants of Health   Financial Resource Strain: Low Risk    Difficulty of Paying Living Expenses: Not hard at all  Food Insecurity: No Food Insecurity   Worried About [place] in the Last Year: Never true   Ran Out of  Food in the Last Year: Never true  Transportation Needs: No Transportation Needs   Lack of Transportation (Medical): No   Lack of Transportation (Non-Medical): No  Physical Activity: Not on file  Stress: No Stress Concern Present   Feeling of Stress : Not at all  Social Connections: Unknown   Frequency of Communication with Friends and Family: More than three times a week   Frequency of Social Gatherings with Friends and Family: More than three times a week   Attends Religious Services: Not on file   Active Member of Clubs or Organizations: Not on file   Attends Archivist Meetings: Not on file   Marital Status: Widowed     Review of Systems  Constitutional:  Negative for appetite change and unexpected weight change.  HENT:  Negative for congestion and sinus pressure.   Respiratory:  Positive for shortness of breath. Negative for cough and chest tightness.   Cardiovascular:  Negative for chest pain, palpitations and leg swelling.   Gastrointestinal:  Negative for diarrhea, nausea and vomiting.  Genitourinary:  Negative for difficulty urinating and dysuria.  Musculoskeletal:  Negative for myalgias.       Right shoulder/collar bone pain.   Skin:  Negative for color change and rash.  Neurological:  Negative for dizziness, light-headedness and headaches.  Psychiatric/Behavioral:  Negative for agitation and dysphoric mood.       Objective:     BP 124/78   Pulse 84   Temp (!) 96.2 F (35.7 C)   Ht 5' 7.01" (1.702 m)   Wt 184 lb 6.4 oz (83.6 kg)   SpO2 95%   BMI 28.87 kg/m  Wt Readings from Last 3 Encounters:  06/06/21 184 lb 6.4 oz (83.6 kg)  03/02/21 178 lb 3.2 oz (80.8 kg)  02/09/21 178 lb 3.2 oz (80.8 kg)    Physical Exam Vitals reviewed.  Constitutional:      General: She is not in acute distress.    Appearance: Normal appearance.  HENT:     Head: Normocephalic and atraumatic.     Right Ear: External ear normal.     Left Ear: External ear normal.  Eyes:     General: No scleral icterus.       Right eye: No discharge.        Left eye: No discharge.     Conjunctiva/sclera: Conjunctivae normal.  Neck:     Thyroid: No thyromegaly.  Cardiovascular:     Rate and Rhythm: Normal rate and regular rhythm.  Pulmonary:     Effort: No respiratory distress.     Breath sounds: Normal breath sounds. No wheezing.  Abdominal:     General: Bowel sounds are normal.     Palpations: Abdomen is soft.     Comments: Minimal abdominal discomfort/pressure - lower abdomen/pelvic region.   Musculoskeletal:        General: No swelling or tenderness.     Cervical back: Neck supple. No tenderness.  Lymphadenopathy:     Cervical: No cervical adenopathy.  Skin:    Findings: No erythema or rash.  Neurological:     Mental Status: She is alert.  Psychiatric:        Mood and Affect: Mood normal.        Behavior: Behavior normal.     Outpatient Encounter Medications as of 06/06/2021  Medication Sig   albuterol  (VENTOLIN HFA) 108 (90 Base) MCG/ACT inhaler Inhale 2 puffs into the lungs every 6 (six) hours as needed for  wheezing or shortness of breath.   aspirin 325 MG tablet Take by mouth.   b complex vitamins capsule Take 1 capsule by mouth daily.   CALCIUM PO Take by mouth.   Cholecalciferol (VITAMIN D3 PO) Take by mouth.   cyclobenzaprine (FLEXERIL) 5 MG tablet Take 1 tablet (5 mg total) by mouth 3 (three) times daily as needed for muscle spasms.   Multiple Vitamins-Minerals (PRESERVISION AREDS 2 PO) Take by mouth.   No facility-administered encounter medications on file as of 06/06/2021.     Lab Results  Component Value Date   WBC 5.5 06/06/2021   HGB 13.1 06/06/2021   HCT 39.5 06/06/2021   PLT 286.0 06/06/2021   GLUCOSE 91 06/06/2021   CHOL 200 06/06/2021   TRIG 92.0 06/06/2021   HDL 39.90 06/06/2021   LDLDIRECT 110.0 02/11/2017   LDLCALC 141 (H) 06/06/2021   ALT 16 06/06/2021   AST 24 06/06/2021   NA 140 06/06/2021   K 4.3 06/06/2021   CL 105 06/06/2021   CREATININE 0.65 06/06/2021   BUN 13 06/06/2021   CO2 27 06/06/2021   TSH 1.96 06/06/2021   HGBA1C 5.4 07/15/2017    DG Chest 2 View  Result Date: 01/31/2021 CLINICAL DATA:  Sudden onset neck pain. EXAM: CHEST - 2 VIEW COMPARISON:  05/16/2020 FINDINGS: The heart size and mediastinal contours are within normal limits. Both lungs are clear. The visualized skeletal structures are unremarkable. IMPRESSION: No active cardiopulmonary disease. Electronically Signed   By: Lucienne Capers M.D.   On: 01/31/2021 19:02   CT Soft Tissue Neck W Contrast  Result Date: 01/31/2021 CLINICAL DATA:  Cervical lymphadenopathy. EXAM: CT NECK WITH CONTRAST TECHNIQUE: Multidetector CT imaging of the neck was performed using the standard protocol following the bolus administration of intravenous contrast. CONTRAST:  37mL OMNIPAQUE IOHEXOL 300 MG/ML  SOLN COMPARISON:  None. FINDINGS: PHARYNX AND LARYNX: The nasopharynx, oropharynx and larynx are normal.  Visible portions of the oral cavity, tongue base and floor of mouth are normal. Normal epiglottis, vallecula and pyriform sinuses. The larynx is normal. No retropharyngeal abscess, effusion or lymphadenopathy. SALIVARY GLANDS: Normal parotid, submandibular and sublingual glands. THYROID: Normal. LYMPH NODES: No enlarged or abnormal density lymph nodes. VASCULAR: Major cervical vessels are patent. LIMITED INTRACRANIAL: Normal. VISUALIZED ORBITS: Normal. MASTOIDS AND VISUALIZED PARANASAL SINUSES: No fluid levels or advanced mucosal thickening. No mastoid effusion. SKELETON: Multilevel facet arthrosis. No bony spinal canal stenosis. UPPER CHEST: Clear. OTHER: None. IMPRESSION: No cervical lymphadenopathy. Electronically Signed   By: Ulyses Jarred M.D.   On: 01/31/2021 20:18   CT C-SPINE NO CHARGE  Result Date: 01/31/2021 CLINICAL DATA:  Neck pain. EXAM: CT CERVICAL SPINE WITHOUT CONTRAST TECHNIQUE: Multidetector CT imaging of the cervical spine were reformatted from contrast enhanced CT of the soft tissue neck. Multiplanar CT image reconstructions were also generated. COMPARISON:  Cervical spine radiographs 03/06/2020, cervical CT 03/01/2016 FINDINGS: Alignment: The straightening of normal cervical lordosis. 2 mm anterolisthesis of C3 on C4. There is mild broad-based rightward curvature of the cervical spine. Skull base and vertebrae: No acute fracture. No primary bone lesion or focal pathologic process. Questionable os odontoideum. Soft tissues and spinal canal: Assessed on concurrent neck CT, reported separately. Disc levels: Mild endplate spurring at P8-E4 and C6-C7. Moderate multilevel facet hypertrophy. Multilevel neural foraminal stenosis. Upper chest: Assessed fully on concurrent neck CT. Other: None. IMPRESSION: 1. Mild broad-based rightward curvature of the cervical spine with multilevel facet hypertrophy. Multilevel neural foraminal stenosis. 2. Chronic straightening of normal  lordosis. Electronically  Signed   By: Keith Rake M.D.   On: 01/31/2021 20:06       Assessment & Plan:   Problem List Items Addressed This Visit     Asthma    Some sob with exertion as outlined.  Previous CXR 01/2021 - lungs clear.  She relates symptoms to deconditioning.  Has f/u with Dr Ubaldo Glassing next week.        History of colonic polyps    Colonoscopy 08/2017 - tubular adenoma.  Recommended f/u colonoscopy in 5 years.       Hypercholesterolemia - Primary    The 10-year ASCVD risk score (Arnett DK, et al., 2019) is: 7.9%   Values used to calculate the score:     Age: 76 years     Sex: Female     Is Non-Hispanic African American: No     Diabetic: No     Tobacco smoker: No     Systolic Blood Pressure: 630 mmHg     Is BP treated: No     HDL Cholesterol: 39.9 mg/dL     Total Cholesterol: 200 mg/dL  Have discussed calculated cholesterol risk.  Discussed recommendation for medication.  Wants to work on diet and exercise.  Follow lipid panel.       Relevant Orders   CBC with Differential/Platelet (Completed)   Basic metabolic panel (Completed)   Hepatic function panel (Completed)   Lipid panel (Completed)   TSH (Completed)   SOB (shortness of breath)    SOB on exertion as outlined.  CXR - ok.  She feels is related to decreased stamina.  Has f/u with Dr Ubaldo Glassing schduled for next week.  Wanted to hold on EKG today.  Discussed exercise.        Stress    Overall appears to be handling things relatively well.  Follow.        Other Visit Diagnoses     Abdominal pressure       Relevant Orders   Urinalysis, Routine w reflex microscopic (Completed)   Urine Culture        Einar Pheasant, MD

## 2021-06-06 NOTE — Assessment & Plan Note (Signed)
Some sob with exertion as outlined.  Previous CXR 01/2021 - lungs clear.  She relates symptoms to deconditioning.  Has f/u with Dr Ubaldo Glassing next week.

## 2021-06-06 NOTE — Assessment & Plan Note (Signed)
Overall appears to be handling things relatively well.  Follow.   

## 2021-06-06 NOTE — Telephone Encounter (Signed)
Advised that Dr Nicki Reaper is out of the office. Offered to move her Feb appt up and change it to a cpe. Patient advised that she wasn't sure this is what she wanted to do and would call back next week to let me know,

## 2021-06-06 NOTE — Telephone Encounter (Signed)
Pt called in requesting to be squeeze back in for her Gyn Physical. Pt stated she didn't realize after she left that she should of did her Gyn Physical appt with Dr. Nicki Reaper. Pt stated that Dr. Leafy Ro office couldn't schedule her right away, they only have appts for next year. Pt was wondering if Dr. Nicki Reaper can do the Gyn Physical today. Pt would like call back

## 2021-06-06 NOTE — Assessment & Plan Note (Signed)
The 10-year ASCVD risk score (Arnett DK, et al., 2019) is: 7.9%   Values used to calculate the score:     Age: 70 years     Sex: Female     Is Non-Hispanic African American: No     Diabetic: No     Tobacco smoker: No     Systolic Blood Pressure: 474 mmHg     Is BP treated: No     HDL Cholesterol: 39.9 mg/dL     Total Cholesterol: 200 mg/dL  Have discussed calculated cholesterol risk.  Discussed recommendation for medication.  Wants to work on diet and exercise.  Follow lipid panel.

## 2021-06-06 NOTE — Assessment & Plan Note (Addendum)
SOB on exertion as outlined.  CXR - ok.  She feels is related to decreased stamina.  Has f/u with Dr Ubaldo Glassing schduled for next week.  Wanted to hold on EKG today.  Discussed exercise.

## 2021-06-07 ENCOUNTER — Telehealth: Payer: Self-pay

## 2021-06-07 ENCOUNTER — Ambulatory Visit: Payer: PPO

## 2021-06-07 DIAGNOSIS — M6281 Muscle weakness (generalized): Secondary | ICD-10-CM

## 2021-06-07 DIAGNOSIS — M25511 Pain in right shoulder: Secondary | ICD-10-CM | POA: Diagnosis not present

## 2021-06-07 DIAGNOSIS — G8929 Other chronic pain: Secondary | ICD-10-CM

## 2021-06-07 LAB — URINE CULTURE
MICRO NUMBER:: 12615088
Result:: NO GROWTH
SPECIMEN QUALITY:: ADEQUATE

## 2021-06-07 NOTE — Telephone Encounter (Signed)
-----   Message from Einar Pheasant, MD sent at 06/06/2021  9:17 PM EST ----- Notify - cholesterol is elevated above goal.  Triglycerides improved.  Given calculated cholesterol risk, it is recommended for her to start a cholesterol medication.  If agreeable, would for her to start crestor 5mg  q day.  Will need liver panel checked 6 weeks after starting the medication.  Bilirubin is slightly elevated, but remainder of liver function tests are wnl.  This can be and is probably a normal variant.  Thyroid test, hgb and kidney function tests are wnl.

## 2021-06-07 NOTE — Therapy (Signed)
[facility] [address], [place], [address] Phone: [phone]   Fax:  [phone]  Physical Therapy Treatment  Patient Details  Name: Lynn Mann MRN: [id number] Date of Birth: 1951/03/21 Referring Provider (PT): Rosalia Hammers, DO   Encounter Date: 06/07/2021   PT End of Session - 06/07/21 1551     Visit Number 6    Number of Visits 17    Date for PT Re-Evaluation 07/19/21    Authorization Type 6    Authorization Time Period 10    PT Start Time 7408    PT Stop Time 1635    PT Time Calculation (min) 42 min    Activity Tolerance Patient tolerated treatment well    Behavior During Therapy [facility] for tasks assessed/performed             Past Medical History:  Diagnosis Date   Arthritis    C. difficile diarrhea    age 72s-40s    Chicken pox    Cholecystitis 11/2011   Did not require sgy - Dr. Staci Acosta - [facility]  (cholelithiasis)   H/O Clostridium difficile infection    IBS (irritable bowel syndrome)    MRSA exposure 2005   Spider bite   MVP (mitral valve prolapse)    Stable - Dr. Ubaldo Glassing   Rheumatic fever     Past Surgical History:  Procedure Laterality Date   CATARACT EXTRACTION  [id number]   MUSCLE BIOPSY      There were no vitals filed for this visit.   Subjective Assessment - 06/07/21 1553     Subjective Had a little pain today when she did things. 3/10 wiht R shoulder protraction. Sometimes she feels better after the exercises, sometimes she feels bad.    Pertinent History R shoulder did not start really hurting until about a month after her fall. Just started with a little pain at the [facility] joint and clavicle. Sometimes she also feels pain along her R pectoralis muscle in the shape of a triangle. Pt fell June 2022. Pt got up to go to the bathroom and tripped over a suitcase. Pt fell against the garment rack on her R side which kind of shielded her R side. Pt also got a hematoma the size of  a baked potato at her L leg afterwards. Pt hurt R toes during the fall which got better after 3 days. Went to urgent care for her leg. Hematoma has improved but still has a tiny knot in her L leg. Pt is R hand dominant. Pt was in PT around May to July 2022 for for her R hip which is better.    Patient Stated Goals Not hurt.    Currently in Pain? Yes    Pain Score 3     Pain Onset More than a month ago                                        PT Education - 06/07/21 1552     Education Details ther-ex    Person(s) Educated Patient    Methods Explanation;Demonstration;Tactile cues;Verbal cues    Comprehension Returned demonstration;Verbalized understanding            Objective      No blood pressure problems No latex allergies      TTP R [place] joint, sternal head on R slightly  more palpable than L, TTP R calvicle and AC joint. Muscle tension R scalene area, upper trap muscle area.      Medbridge Access Code [email or web address]   Has osteopenia   Therapeutic exercise   R shoulder AROM  Flexion 125 with pain  Gentle eccentric PT manually resisted R shoulder flexion (extension resistance) 10x2  to 50 to 90 degrees.   Seated R shoulder extension isometrics in neutral 10x2 with 5 second holds   Scapular retraction 3x5 seconds. Clavicular discomfort.     Improved exercise technique, movement at target joints, use of target muscles after min to mod verbal, visual, tactile cues.      Manual therapy   Seated STM L and R upper trap muscles to decrease muscle tension                Improved upper trap muscle comfort level reported  Some symptoms in R clavicle with patpation to different areas of R upper trap muscle  Seated gentle STM R cervical paraspinal muscles around C3-5 area to decrease tension. Slight improved comfort level afterwards.        Response to treatment Pt tolerated session well without aggravation of symptoms.    Clinical  impression  Slight decrease in R clavicle discomfort at rest after STM to R upper trap as well as R cervical paraspinal muscles to decrease tension. Still demonstrates pain with arm movements. Slight decreased clavicle discomfort with resisted shoulder extension but demonstrates sternal discomfort, eases with rest. Fair tolerance to today's session. Pt will benefit from continued skilled physical therapy services to decrease pain, improve strength and function.       PT Short Term Goals - 05/22/21 1245       PT SHORT TERM GOAL #1   Title Pt will be independent with her initial HEP to decrease pain, improve strength, function, and ability to reach, push, and pull more comfortably.    Baseline Pt has started her HEP (05/22/2021)    Time 3    Period Weeks    Status New    Target Date 06/14/21               PT Long Term Goals - 05/22/21 1247       PT LONG TERM GOAL #1   Title Pt will have a decrease in R shoulder pain to 3/10 or less at worst to promote ability to reach, open and close the door, lift items more comfortably.    Baseline 10/10 R shoulder pain at worst for the past 2 months (05/22/2021)    Time 8    Period Weeks    Status New    Target Date 07/19/21      PT LONG TERM GOAL #2   Title Pt will improve R shoulder flexion AROM to 150 degrees or more without pain to promote ability to reach.    Baseline 90 degrees R shoulder flexion AROM with pain (05/22/2021)    Time 8    Period Weeks    Status New    Target Date 07/19/21      PT LONG TERM GOAL #3   Title Pt will improve her R shoulder strength all planes by at least 1/2 MMT grade to promote ability to reach as well as lift items.    Baseline R shoulder flexion 4/5, abduction 4-/5, ER 4+/5, IR 4/5 (05/22/2021)    Time 8    Period Weeks    Status New    Target Date 07/19/21  PT LONG TERM GOAL #4   Title Pt will improve R shoulder FOTO score by at least 10 points as a demonstration of improved function.     Baseline R shoulder FOTO 49 (05/22/2021)    Time 8    Period Weeks    Status New    Target Date 07/19/21                   Plan - 06/07/21 1552     Clinical Impression Statement Slight decrease in R clavicle discomfort at rest after STM to R upper trap as well as R cervical paraspinal muscles to decrease tension. Still demonstrates pain with arm movements. Slight decreased clavicle discomfort with resisted shoulder extension but demonstrates sternal discomfort, eases with rest. Fair tolerance to today's session. Pt will benefit from continued skilled physical therapy services to decrease pain, improve strength and function.    Personal Factors and Comorbidities Age;Comorbidity 2    Comorbidities Arthritis, Mitral valve prolapse    Examination-Activity Limitations Lift;Reach Overhead;Carry;Bed Mobility    Stability/Clinical Decision Making Stable/Uncomplicated    Clinical Decision Making Low    Rehab Potential Fair    PT Frequency 2x / week    PT Duration 8 weeks    PT Treatment/Interventions Therapeutic activities;Therapeutic exercise;Manual techniques;Electrical Stimulation;Iontophoresis 4mg /ml Dexamethasone;Functional mobility training;Neuromuscular re-education;Patient/family education;Dry needling    PT Next Visit Plan scapular strengthening, gentle strengthening, stability, manual techniques, modalities PRN    PT Home Exercise Plan Medbridge Access Code [email or web address]    Consulted and Agree with Plan of Care Patient             Patient will benefit from skilled therapeutic intervention in order to improve the following deficits and impairments:  Pain, Improper body mechanics, Postural dysfunction, Decreased strength  Visit Diagnosis: Chronic right shoulder pain  Muscle weakness (generalized)     Problem List Patient Active Problem List   Diagnosis Date Noted   Hematoma 02/11/2021   Ankle swelling 11/26/2020   Low back pain 11/22/2020   Hip pain, right 11/22/2020    Light headedness 06/24/2020   Hearing loss 06/24/2020   Change in vision 06/24/2020   Stress 05/17/2020   Chest pain 05/16/2020   Welcome to Medicare preventive visit 06/20/2019   Viral syndrome 01/30/2019   Itching 07/21/2017   Asthma 07/16/2016   Urinary incontinence 07/16/2016   SOB (shortness of breath) 04/01/2016   Bilateral shoulder pain 02/24/2016   Fatigue 01/28/2016   Neck fullness 01/28/2016   Routine general medical examination at a health care facility 07/25/2015   Health care maintenance 10/09/2014   Neck pain 11/26/2013   Hypercholesterolemia 11/26/2013   History of colonic polyps 05/11/2013   Hyperbilirubinemia 01/18/2013   GERD (gastroesophageal reflux disease) 12/03/2012   Cholelithiasis 11/07/2012   IBS (irritable bowel syndrome) 11/07/2012   History of rheumatic fever 08/28/2012   MVP (mitral valve prolapse) 08/28/2012    Joneen Boers PT, DPT   06/07/2021, 5:34 PM  [facility] [address] [place], [address] Phone: [phone]   Fax:  [phone]  Name: SHANITA KANAN MRN: [id number] Date of Birth: 1951-07-01

## 2021-06-11 ENCOUNTER — Other Ambulatory Visit: Payer: Self-pay

## 2021-06-11 ENCOUNTER — Ambulatory Visit
Admission: RE | Admit: 2021-06-11 | Discharge: 2021-06-11 | Disposition: A | Discharge status: Home / Self Care | Payer: PPO | Source: Ambulatory Visit | Attending: Internal Medicine | Admitting: Internal Medicine

## 2021-06-11 ENCOUNTER — Telehealth: Payer: Self-pay

## 2021-06-11 DIAGNOSIS — E7800 Pure hypercholesterolemia, unspecified: Secondary | ICD-10-CM

## 2021-06-11 DIAGNOSIS — Z1231 Encounter for screening mammogram for malignant neoplasm of breast: Secondary | ICD-10-CM | POA: Insufficient documentation

## 2021-06-11 IMAGING — MG MM DIGITAL SCREENING BILAT W/ TOMO AND CAD
6 of 10 series · 6 of 30 positions shown · non-contrast
Comparison: Previous exam(s).

CLINICAL DATA: Screening.

EXAM:
DIGITAL SCREENING BILATERAL MAMMOGRAM WITH TOMOSYNTHESIS AND CAD
TECHNIQUE: Bilateral screening digital craniocaudal and mediolateral oblique
mammograms were obtained. Bilateral screening digital breast
tomosynthesis was performed. The images were evaluated with
computer-aided detection.

[L CC synth-2D]
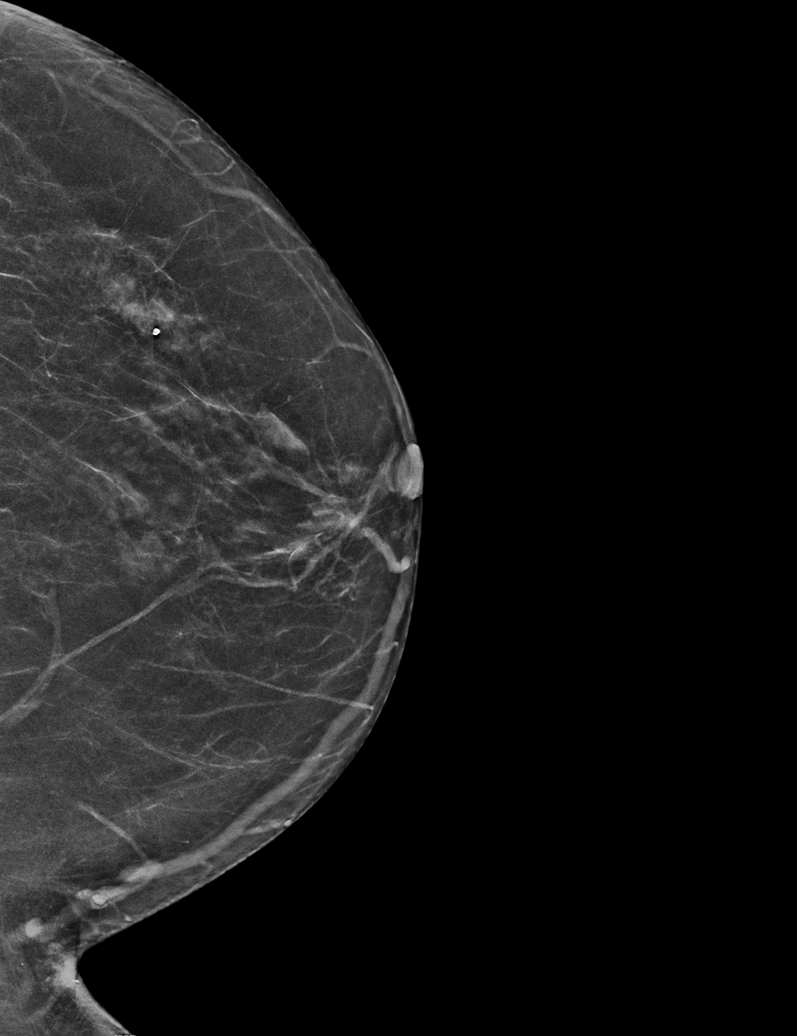

[R MLO synth-2D]
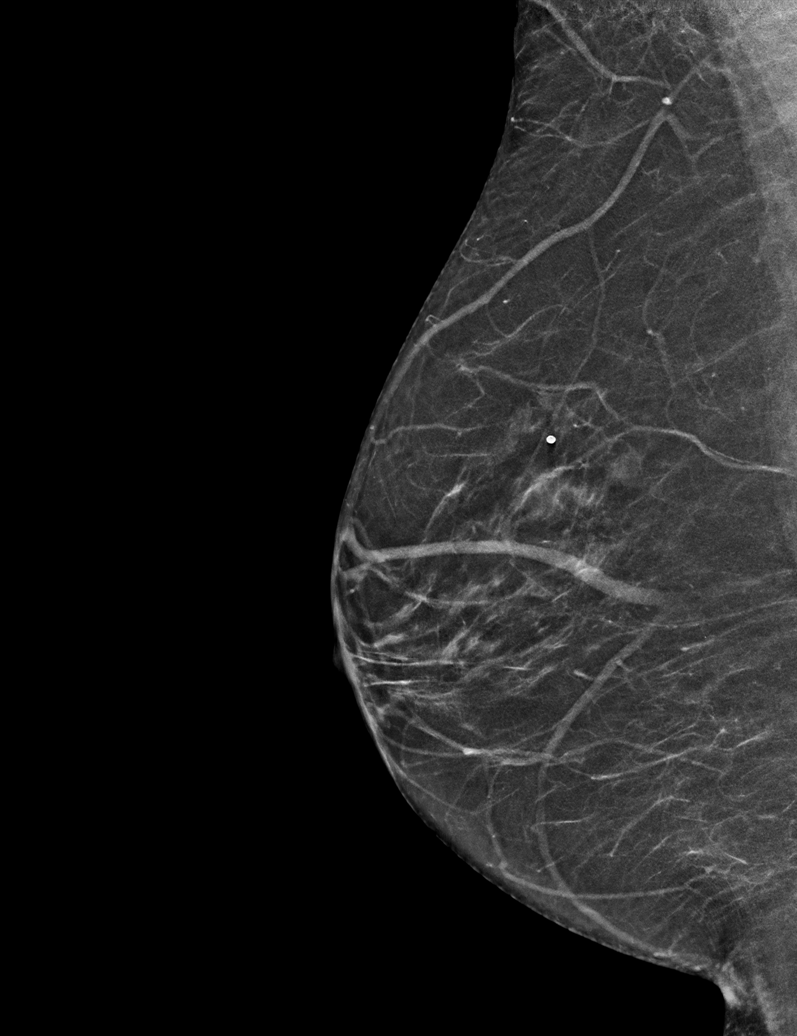

[L MLO synth-2D (1 of 2)]
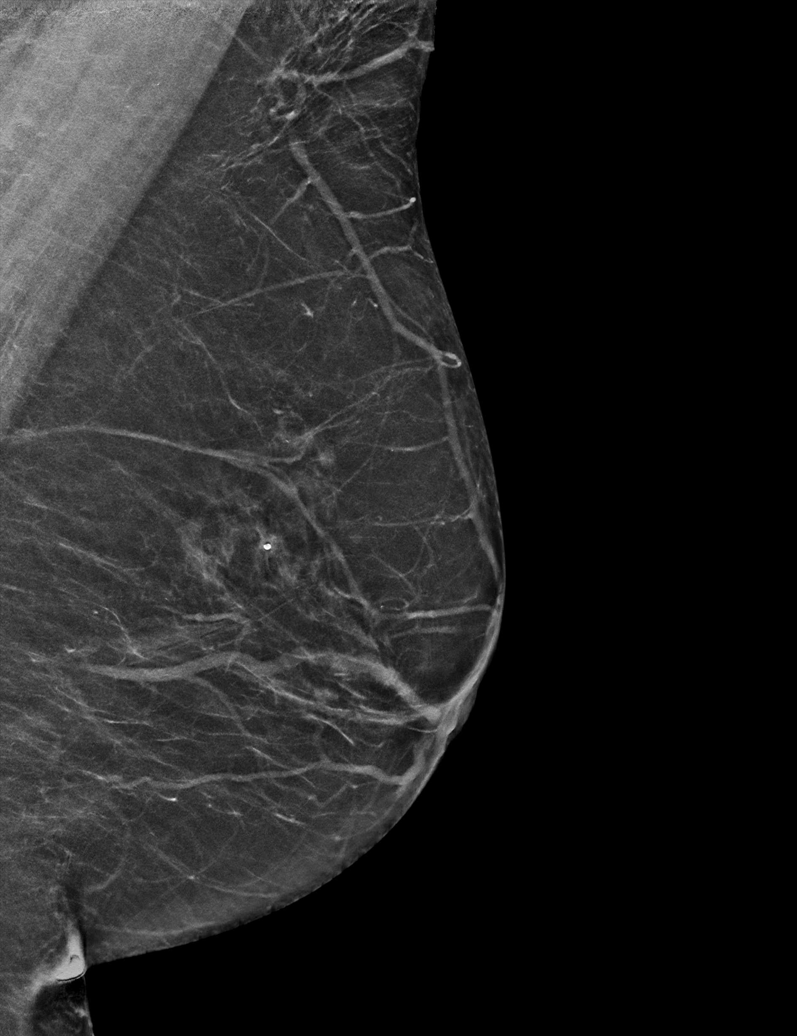

[R CC synth-2D]
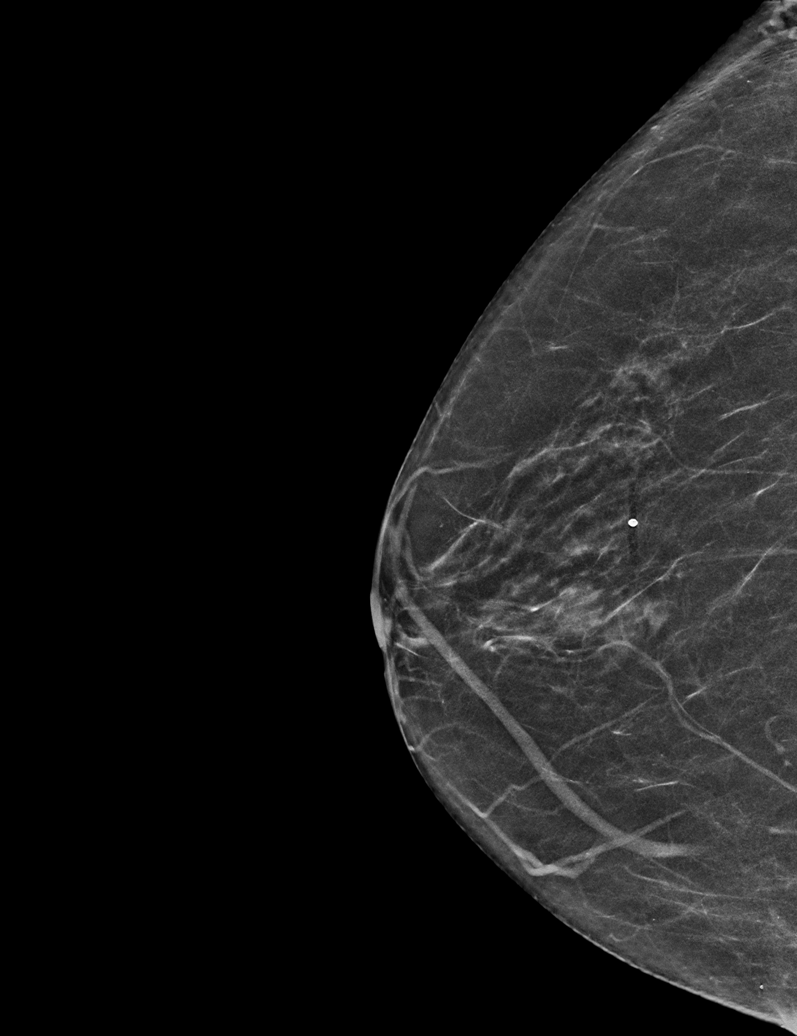

[L MLO synth-2D (2 of 2)]
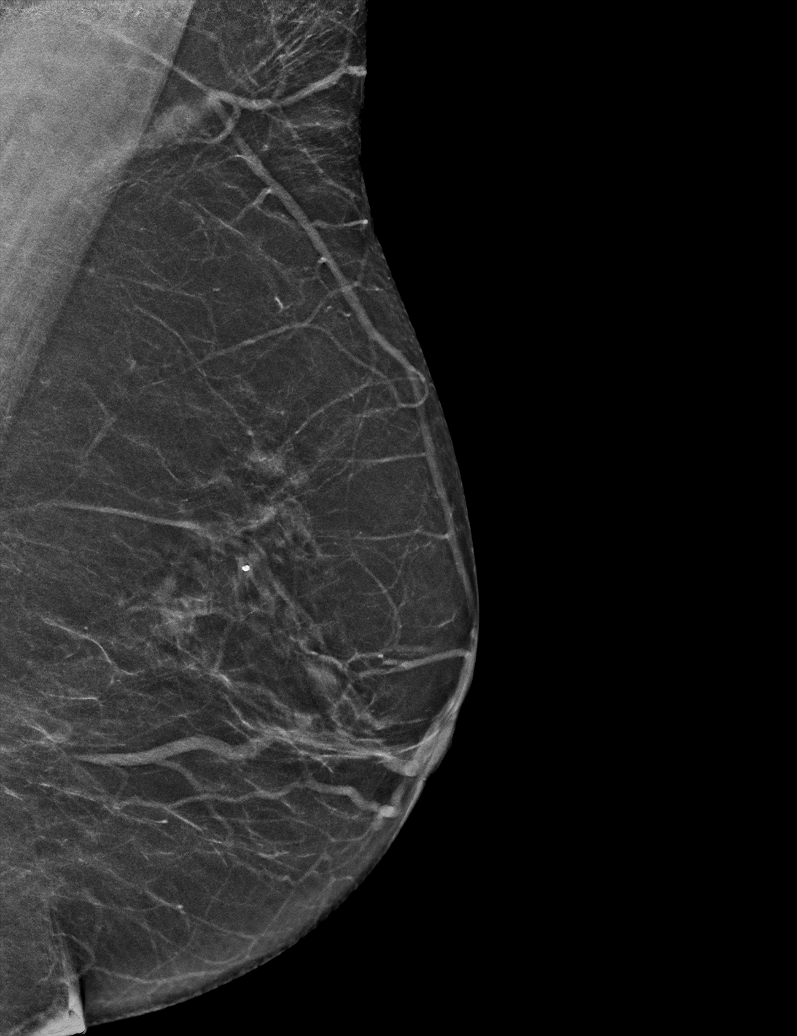

[R MLO tomo · tomo slice 25/49.0]
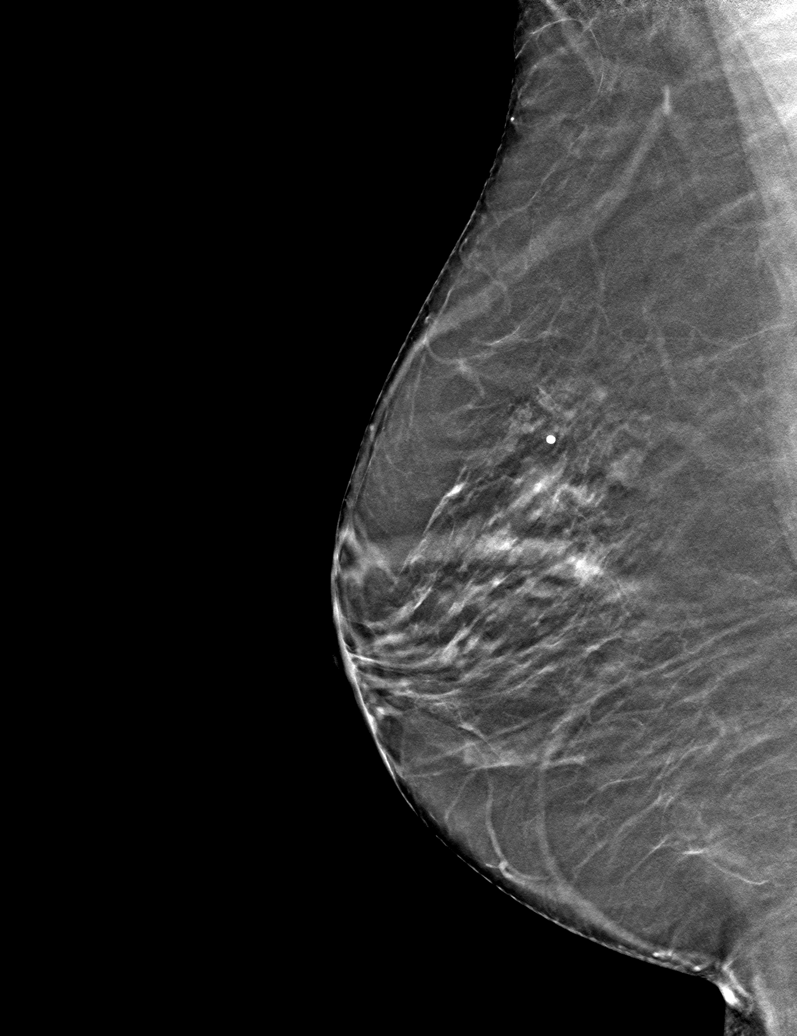

[6 of 30 positions shown; findings below may reference images not displayed]

ACR Breast Density Category b: There are scattered areas of
fibroglandular density.
FINDINGS: There are no findings suspicious for malignancy.
IMPRESSION: No mammographic evidence of malignancy. A result letter of this
screening mammogram will be mailed directly to the patient.

RECOMMENDATION:
Screening mammogram in one year. (Code:[BY])

BI-RADS CATEGORY  1: Negative.

## 2021-06-11 NOTE — Telephone Encounter (Signed)
LMTCB

## 2021-06-12 ENCOUNTER — Ambulatory Visit: Payer: PPO

## 2021-06-12 DIAGNOSIS — E7800 Pure hypercholesterolemia, unspecified: Secondary | ICD-10-CM | POA: Diagnosis not present

## 2021-06-12 DIAGNOSIS — I341 Nonrheumatic mitral (valve) prolapse: Secondary | ICD-10-CM | POA: Diagnosis not present

## 2021-06-12 DIAGNOSIS — G8929 Other chronic pain: Secondary | ICD-10-CM

## 2021-06-12 DIAGNOSIS — M25511 Pain in right shoulder: Secondary | ICD-10-CM | POA: Diagnosis not present

## 2021-06-12 DIAGNOSIS — M6281 Muscle weakness (generalized): Secondary | ICD-10-CM

## 2021-06-12 DIAGNOSIS — R079 Chest pain, unspecified: Secondary | ICD-10-CM | POA: Diagnosis not present

## 2021-06-12 NOTE — Telephone Encounter (Signed)
Pt returning call. Pt also states she spoke with her cardiologist about the medication crestor and he went ahead and prescribed it for her. 5mg  twice a week.

## 2021-06-12 NOTE — Therapy (Signed)
[facility] [address], [place], [address] Phone: [phone]   Fax:  [phone]  Physical Therapy Treatment  Patient Details  Name: Lynn Mann MRN: [id number] Date of Birth: 12-16-1950 Referring Provider (PT): Rosalia Hammers, DO   Encounter Date: 06/12/2021   PT End of Session - 06/12/21 1506     Visit Number 7    Number of Visits 17    Date for PT Re-Evaluation 07/19/21    Authorization Type 7    Authorization Time Period 10    PT Start Time 1506    PT Stop Time 8099    PT Time Calculation (min) 41 min    Activity Tolerance Patient tolerated treatment well    Behavior During Therapy [facility] for tasks assessed/performed             Past Medical History:  Diagnosis Date   Arthritis    C. difficile diarrhea    age 33s-40s    Chicken pox    Cholecystitis 11/2011   Did not require sgy - Dr. Staci Acosta - [facility]  (cholelithiasis)   H/O Clostridium difficile infection    IBS (irritable bowel syndrome)    MRSA exposure 2005   Spider bite   MVP (mitral valve prolapse)    Stable - Dr. Ubaldo Glassing   Rheumatic fever     Past Surgical History:  Procedure Laterality Date   CATARACT EXTRACTION  [id number]   MUSCLE BIOPSY      There were no vitals filed for this visit.   Subjective Assessment - 06/12/21 1508     Subjective R shoulder hurt a little bit several times today but pt thinks there are changes for the better. Able to squeeze her shoulder blades down low. Currently not hurting but its not comfortable.    Pertinent History R shoulder did not start really hurting until about a month after her fall. Just started with a little pain at the [facility] joint and clavicle. Sometimes she also feels pain along her R pectoralis muscle in the shape of a triangle. Pt fell June 2022. Pt got up to go to the bathroom and tripped over a suitcase. Pt fell against the garment rack on her R side which kind of shielded her R  side. Pt also got a hematoma the size of a baked potato at her L leg afterwards. Pt hurt R toes during the fall which got better after 3 days. Went to urgent care for her leg. Hematoma has improved but still has a tiny knot in her L leg. Pt is R hand dominant. Pt was in PT around May to July 2022 for for her R hip which is better.    Patient Stated Goals Not hurt.    Currently in Pain? Other (Comment)   Not pain but an ache.   Pain Orientation Right    Pain Descriptors / Indicators Aching    Pain Onset More than a month ago                                        PT Education - 06/12/21 1514     Education Details ther-ex    Person(s) Educated Patient    Methods Explanation;Demonstration;Tactile cues;Verbal cues    Comprehension Returned demonstration;Verbalized understanding            Objective  No blood pressure problems No latex allergies      TTP R [place] joint, sternal head on R slightly more palpable than L, TTP R calvicle and AC joint. Muscle tension R scalene area, upper trap muscle area.      Medbridge Access Code [email or web address]   Has osteopenia   Therapeutic exercise   Seated manually resisted R scapular retraction isometrics in neutral 10x3 with 5 second holds   Seated manually resisted R scapular depression isometrics in neutral 10x3 with 5 second holds   Seated gentle L cervical side bend stretch 10 seconds x 5  Seated cervical flexion stretch 5x10 seconds   Seated gentle chin tucks 10x5 seconds   Seated R SCM stretch 5x10 seconds     Improved exercise technique, movement at target joints, use of target muscles after min to mod verbal, visual, tactile cues.      Manual therapy   Seated STM R scalene muscle area to decrease tension   Seated STM L and R upper trap muscles to decrease muscle tension                Seated gentle STM R cervical paraspinal muscles around C3-5 area to decrease tension.        Response to  treatment Fair tolerance to today's session   Clinical impression Pt demonstrates possible improvement in symptoms overall with reports of being better able to perform scapular retractions more comfortably. Continued working on decreasing muscle tension to her neck to decrease possible nerve related pain from her neck. Continued working on gentle scapular strengthening as tolerated to promote better positioning of R proximal clavicle. Fair tolerance to today's session. Pt will benefit from continued skilled physical therapy services to decrease pain, improve strength and function.          PT Short Term Goals - 05/22/21 1245       PT SHORT TERM GOAL #1   Title Pt will be independent with her initial HEP to decrease pain, improve strength, function, and ability to reach, push, and pull more comfortably.    Baseline Pt has started her HEP (05/22/2021)    Time 3    Period Weeks    Status New    Target Date 06/14/21               PT Long Term Goals - 05/22/21 1247       PT LONG TERM GOAL #1   Title Pt will have a decrease in R shoulder pain to 3/10 or less at worst to promote ability to reach, open and close the door, lift items more comfortably.    Baseline 10/10 R shoulder pain at worst for the past 2 months (05/22/2021)    Time 8    Period Weeks    Status New    Target Date 07/19/21      PT LONG TERM GOAL #2   Title Pt will improve R shoulder flexion AROM to 150 degrees or more without pain to promote ability to reach.    Baseline 90 degrees R shoulder flexion AROM with pain (05/22/2021)    Time 8    Period Weeks    Status New    Target Date 07/19/21      PT LONG TERM GOAL #3   Title Pt will improve her R shoulder strength all planes by at least 1/2 MMT grade to promote ability to reach as well as lift items.    Baseline R shoulder flexion 4/5, abduction  4-/5, ER 4+/5, IR 4/5 (05/22/2021)    Time 8    Period Weeks    Status New    Target Date 07/19/21      PT  LONG TERM GOAL #4   Title Pt will improve R shoulder FOTO score by at least 10 points as a demonstration of improved function.    Baseline R shoulder FOTO 49 (05/22/2021)    Time 8    Period Weeks    Status New    Target Date 07/19/21                   Plan - 06/12/21 1828     Clinical Impression Statement Pt demonstrates possible improvement in symptoms overall with reports of being better able to perform scapular retractions more comfortably. Continued working on decreasing muscle tension to her neck to decrease possible nerve related pain from her neck. Continued working on gentle scapular strengthening as tolerated to promote better positioning of R proximal clavicle. Fair tolerance to today's session. Pt will benefit from continued skilled physical therapy services to decrease pain, improve strength and function.    Personal Factors and Comorbidities Age;Comorbidity 2    Comorbidities Arthritis, Mitral valve prolapse    Examination-Activity Limitations Lift;Reach Overhead;Carry;Bed Mobility    Stability/Clinical Decision Making Stable/Uncomplicated    Rehab Potential Fair    PT Frequency 2x / week    PT Duration 8 weeks    PT Treatment/Interventions Therapeutic activities;Therapeutic exercise;Manual techniques;Electrical Stimulation;Iontophoresis 4mg /ml Dexamethasone;Functional mobility training;Neuromuscular re-education;Patient/family education;Dry needling    PT Next Visit Plan scapular strengthening, gentle strengthening, stability, manual techniques, modalities PRN    PT Home Exercise Plan Medbridge Access Code [email or web address]    Consulted and Agree with Plan of Care Patient             Patient will benefit from skilled therapeutic intervention in order to improve the following deficits and impairments:  Pain, Improper body mechanics, Postural dysfunction, Decreased strength  Visit Diagnosis: Chronic right shoulder pain  Muscle weakness (generalized)     Problem  List Patient Active Problem List   Diagnosis Date Noted   Hematoma 02/11/2021   Ankle swelling 11/26/2020   Low back pain 11/22/2020   Hip pain, right 11/22/2020   Light headedness 06/24/2020   Hearing loss 06/24/2020   Change in vision 06/24/2020   Stress 05/17/2020   Chest pain 05/16/2020   Welcome to Medicare preventive visit 06/20/2019   Viral syndrome 01/30/2019   Itching 07/21/2017   Asthma 07/16/2016   Urinary incontinence 07/16/2016   SOB (shortness of breath) 04/01/2016   Bilateral shoulder pain 02/24/2016   Fatigue 01/28/2016   Neck fullness 01/28/2016   Routine general medical examination at a health care facility 07/25/2015   Health care maintenance 10/09/2014   Neck pain 11/26/2013   Hypercholesterolemia 11/26/2013   History of colonic polyps 05/11/2013   Hyperbilirubinemia 01/18/2013   GERD (gastroesophageal reflux disease) 12/03/2012   Cholelithiasis 11/07/2012   IBS (irritable bowel syndrome) 11/07/2012   History of rheumatic fever 08/28/2012   MVP (mitral valve prolapse) 08/28/2012     Joneen Boers PT, DPT   06/12/2021, 6:51 PM  [facility] [address], [place], [address] Phone: [phone]   Fax:  [phone]  Name: Lynn Mann MRN: [id number] Date of Birth: 12/26/1950

## 2021-06-13 DIAGNOSIS — M542 Cervicalgia: Secondary | ICD-10-CM | POA: Diagnosis not present

## 2021-06-13 DIAGNOSIS — M5412 Radiculopathy, cervical region: Secondary | ICD-10-CM | POA: Diagnosis not present

## 2021-06-13 NOTE — Telephone Encounter (Signed)
Thank her for the update.  I am glad she is starting the medication.  She will need a liver panel checked 6 weeks after starting the medication.

## 2021-06-14 ENCOUNTER — Ambulatory Visit: Payer: PPO

## 2021-06-14 ENCOUNTER — Other Ambulatory Visit: Payer: Self-pay

## 2021-06-14 DIAGNOSIS — M6281 Muscle weakness (generalized): Secondary | ICD-10-CM

## 2021-06-14 DIAGNOSIS — E7800 Pure hypercholesterolemia, unspecified: Secondary | ICD-10-CM

## 2021-06-14 DIAGNOSIS — M25511 Pain in right shoulder: Secondary | ICD-10-CM | POA: Diagnosis not present

## 2021-06-14 DIAGNOSIS — G8929 Other chronic pain: Secondary | ICD-10-CM

## 2021-06-14 NOTE — Therapy (Signed)
[facility] [address] [place], [address] Phone: [phone]   Fax:  [phone]  Physical Therapy Treatment  Patient Details  Name: Lynn Mann MRN: [id number] Date of Birth: 08/23/1950 Referring Provider (PT): Rosalia Hammers, DO   Encounter Date: 06/14/2021   PT End of Session - 06/14/21 1502     Visit Number 8    Number of Visits 17    Date for PT Re-Evaluation 07/19/21    Authorization Type 8    Authorization Time Period 10    PT Start Time 1502    PT Stop Time 1545    PT Time Calculation (min) 43 min    Activity Tolerance Patient tolerated treatment well    Behavior During Therapy [facility] for tasks assessed/performed             Past Medical History:  Diagnosis Date   Arthritis    C. difficile diarrhea    age 79s-40s    Chicken pox    Cholecystitis 11/2011   Did not require sgy - Dr. Staci Acosta - [facility]  (cholelithiasis)   H/O Clostridium difficile infection    IBS (irritable bowel syndrome)    MRSA exposure 2005   Spider bite   MVP (mitral valve prolapse)    Stable - Dr. Ubaldo Glassing   Rheumatic fever     Past Surgical History:  Procedure Laterality Date   CATARACT EXTRACTION  [id number]   MUSCLE BIOPSY      There were no vitals filed for this visit.   Subjective Assessment - 06/14/21 1503     Subjective R shoulder is off and on still. Just a mild ache. Hurt this morning from doing stuff.    Pertinent History R shoulder did not start really hurting until about a month after her fall. Just started with a little pain at the [facility] joint and clavicle. Sometimes she also feels pain along her R pectoralis muscle in the shape of a triangle. Pt fell June 2022. Pt got up to go to the bathroom and tripped over a suitcase. Pt fell against the garment rack on her R side which kind of shielded her R side. Pt also got a hematoma the size of a baked potato at her L leg afterwards. Pt hurt R toes during the  fall which got better after 3 days. Went to urgent care for her leg. Hematoma has improved but still has a tiny knot in her L leg. Pt is R hand dominant. Pt was in PT around May to July 2022 for for her R hip which is better.    Patient Stated Goals Not hurt.    Currently in Pain? Yes    Pain Score 3     Pain Descriptors / Indicators Aching    Pain Onset More than a month ago                                        PT Education - 06/14/21 1724     Education Details ther-ex    Person(s) Educated Patient    Methods Explanation;Demonstration;Tactile cues;Verbal cues    Comprehension Returned demonstration;Verbalized understanding           Objective      No blood pressure problems No latex allergies      TTP R [place] joint, sternal head on R slightly more  palpable than L, TTP R calvicle and AC joint. Muscle tension R scalene area, upper trap muscle area.      Medbridge Access Code [email or web address]   Has osteopenia   Therapeutic exercise   Seated B scapular retraction 10x3 with 5 second holds  Seated manually resisted R shoulder extension/adduction isometrics in neutral 10x5 seconds   Seated R shoulder assisted adduction at about 60 degrees flexion 3x  Discomfort  Seated R shoulder assisted R shoulder horizontal abduction with scapular retraction with gentle distraction.   Feels better per pt   Seated B scapular retraction 10x with 5 second holds  Standing heel raises to promote blood flow via musculovenous pump secondary to possible orthostatic symptoms from standing up from a chair. No reports of "Jelly legs" afterwards.      Improved exercise technique, movement at target joints, use of target muscles after min to mod verbal, visual, tactile cues.      Manual therapy     Deep breaths with PT gently guiding R clavicle through proper motion  Seated STM R proximal pectoralis muscle just inferior to clavicle to decrease tension          Response to treatment Pt tolerated session well without aggravation of symptoms.    Clinical impression Improved R sternoclavicular joint comfort with treatment to promote R scapular retraction, depression, and gentle joint distraction. Pt will benefit from continued skilled physical therapy services to decrease pain, improve strength and function.    PT Short Term Goals - 05/22/21 1245       PT SHORT TERM GOAL #1   Title Pt will be independent with her initial HEP to decrease pain, improve strength, function, and ability to reach, push, and pull more comfortably.    Baseline Pt has started her HEP (05/22/2021)    Time 3    Period Weeks    Status New    Target Date 06/14/21               PT Long Term Goals - 05/22/21 1247       PT LONG TERM GOAL #1   Title Pt will have a decrease in R shoulder pain to 3/10 or less at worst to promote ability to reach, open and close the door, lift items more comfortably.    Baseline 10/10 R shoulder pain at worst for the past 2 months (05/22/2021)    Time 8    Period Weeks    Status New    Target Date 07/19/21      PT LONG TERM GOAL #2   Title Pt will improve R shoulder flexion AROM to 150 degrees or more without pain to promote ability to reach.    Baseline 90 degrees R shoulder flexion AROM with pain (05/22/2021)    Time 8    Period Weeks    Status New    Target Date 07/19/21      PT LONG TERM GOAL #3   Title Pt will improve her R shoulder strength all planes by at least 1/2 MMT grade to promote ability to reach as well as lift items.    Baseline R shoulder flexion 4/5, abduction 4-/5, ER 4+/5, IR 4/5 (05/22/2021)    Time 8    Period Weeks    Status New    Target Date 07/19/21      PT LONG TERM GOAL #4   Title Pt will improve R shoulder FOTO score by at least 10 points as a demonstration of improved  function.    Baseline R shoulder FOTO 49 (05/22/2021)    Time 8    Period Weeks    Status New    Target Date 07/19/21                    Plan - 06/14/21 1725     Clinical Impression Statement Improved R sternoclavicular joint comfort with treatment to promote R scapular retraction, depression, and gentle joint distraction. Pt will benefit from continued skilled physical therapy services to decrease pain, improve strength and function.    Personal Factors and Comorbidities Age;Comorbidity 2    Comorbidities Arthritis, Mitral valve prolapse    Examination-Activity Limitations Lift;Reach Overhead;Carry;Bed Mobility    Stability/Clinical Decision Making Stable/Uncomplicated    Rehab Potential Fair    PT Frequency 2x / week    PT Duration 8 weeks    PT Treatment/Interventions Therapeutic activities;Therapeutic exercise;Manual techniques;Electrical Stimulation;Iontophoresis 4mg /ml Dexamethasone;Functional mobility training;Neuromuscular re-education;Patient/family education;Dry needling    PT Next Visit Plan scapular strengthening, gentle strengthening, stability, manual techniques, modalities PRN    PT Home Exercise Plan Medbridge Access Code [email or web address]    Consulted and Agree with Plan of Care Patient             Patient will benefit from skilled therapeutic intervention in order to improve the following deficits and impairments:  Pain, Improper body mechanics, Postural dysfunction, Decreased strength  Visit Diagnosis: Chronic right shoulder pain  Muscle weakness (generalized)     Problem List Patient Active Problem List   Diagnosis Date Noted   Hematoma 02/11/2021   Ankle swelling 11/26/2020   Low back pain 11/22/2020   Hip pain, right 11/22/2020   Light headedness 06/24/2020   Hearing loss 06/24/2020   Change in vision 06/24/2020   Stress 05/17/2020   Chest pain 05/16/2020   Welcome to Medicare preventive visit 06/20/2019   Viral syndrome 01/30/2019   Itching 07/21/2017   Asthma 07/16/2016   Urinary incontinence 07/16/2016   SOB (shortness of breath) 04/01/2016   Bilateral shoulder  pain 02/24/2016   Fatigue 01/28/2016   Neck fullness 01/28/2016   Routine general medical examination at a health care facility 07/25/2015   Health care maintenance 10/09/2014   Neck pain 11/26/2013   Hypercholesterolemia 11/26/2013   History of colonic polyps 05/11/2013   Hyperbilirubinemia 01/18/2013   GERD (gastroesophageal reflux disease) 12/03/2012   Cholelithiasis 11/07/2012   IBS (irritable bowel syndrome) 11/07/2012   History of rheumatic fever 08/28/2012   MVP (mitral valve prolapse) 08/28/2012   Joneen Boers PT, DPT    06/14/2021, 5:28 PM  [facility] [address] [place], [address] Phone: [phone]   Fax:  [phone]  Name: Lynn Mann MRN: [id number] Date of Birth: 1951-04-05

## 2021-06-14 NOTE — Telephone Encounter (Signed)
Mychart sent.

## 2021-06-19 ENCOUNTER — Ambulatory Visit: Payer: PPO

## 2021-06-19 DIAGNOSIS — G8929 Other chronic pain: Secondary | ICD-10-CM

## 2021-06-19 DIAGNOSIS — M6281 Muscle weakness (generalized): Secondary | ICD-10-CM

## 2021-06-19 DIAGNOSIS — M25511 Pain in right shoulder: Secondary | ICD-10-CM | POA: Diagnosis not present

## 2021-06-19 NOTE — Therapy (Signed)
[facility] [address], [place], [address] Phone: [phone]   Fax:  [phone]  Physical Therapy Treatment  Patient Details  Name: Lynn Mann MRN: [id number] Date of Birth: 20-Apr-1951 Referring Provider (PT): Rosalia Hammers, DO   Encounter Date: 06/19/2021   PT End of Session - 06/19/21 1208     Visit Number 9    Number of Visits 17    Date for PT Re-Evaluation 07/19/21    Authorization Type 9    Authorization Time Period 10    PT Start Time 1207   Pt arrived late   PT Stop Time 1235    PT Time Calculation (min) 28 min    Activity Tolerance Patient tolerated treatment well    Behavior During Therapy [facility] for tasks assessed/performed             Past Medical History:  Diagnosis Date   Arthritis    C. difficile diarrhea    age 14s-40s    Chicken pox    Cholecystitis 11/2011   Did not require sgy - Dr. Staci Acosta - [facility]  (cholelithiasis)   H/O Clostridium difficile infection    IBS (irritable bowel syndrome)    MRSA exposure 2005   Spider bite   MVP (mitral valve prolapse)    Stable - Dr. Ubaldo Glassing   Rheumatic fever     Past Surgical History:  Procedure Laterality Date   CATARACT EXTRACTION  [id number]   MUSCLE BIOPSY      There were no vitals filed for this visit.   Subjective Assessment - 06/19/21 1209     Subjective I think the steriods are helping with the inflammation. 1-2/10 currently.    Pertinent History R shoulder did not start really hurting until about a month after her fall. Just started with a little pain at the [facility] joint and clavicle. Sometimes she also feels pain along her R pectoralis muscle in the shape of a triangle. Pt fell June 2022. Pt got up to go to the bathroom and tripped over a suitcase. Pt fell against the garment rack on her R side which kind of shielded her R side. Pt also got a hematoma the size of a baked potato at her L leg afterwards. Pt hurt R toes  during the fall which got better after 3 days. Went to urgent care for her leg. Hematoma has improved but still has a tiny knot in her L leg. Pt is R hand dominant. Pt was in PT around May to July 2022 for for her R hip which is better.    Patient Stated Goals Not hurt.    Currently in Pain? Yes    Pain Score 2     Pain Onset More than a month ago                                        PT Education - 06/19/21 1233     Education Details ther-ex    [employer]) Educated Patient    Methods Explanation;Demonstration;Tactile cues;Verbal cues    Comprehension Returned demonstration;Verbalized understanding           Objective      No blood pressure problems No latex allergies      TTP R [place] joint, sternal head on R slightly more palpable than L, TTP R calvicle and AC joint. Muscle  tension R scalene area, upper trap muscle area.      Medbridge Access Code [email or web address]   Has osteopenia   Therapeutic exercise     Seated R shoulder assisted R shoulder horizontal abduction with scapular retraction with gentle distraction. 10x3            Seated B scapular retraction 10x2 with 5 second holds  The with PT manual resistance to lower trap    R 10x5 seconds for 2 sets  Improved exercise technique, movement at target joints, use of target muscles after min to mod verbal, visual, tactile cues.      Manual therapy   Seated STM R proximal pectoralis muscle just inferior to clavicle to decrease tension          Response to treatment Fair tolerance to today's session.    Clinical impression Pt arrived late so session was adjusted accordingly. Continued working on decreasing muscle tension to R clavicle as well as improving scapular retraction to promote better positioning of R proximal clavicle. Fair tolerance to today's session. Pt will benefit from continued skilled physical therapy services to decrease pain, improve strength and function.        PT  Short Term Goals - 05/22/21 1245       PT SHORT TERM GOAL #1   Title Pt will be independent with her initial HEP to decrease pain, improve strength, function, and ability to reach, push, and pull more comfortably.    Baseline Pt has started her HEP (05/22/2021)    Time 3    Period Weeks    Status New    Target Date 06/14/21               PT Long Term Goals - 05/22/21 1247       PT LONG TERM GOAL #1   Title Pt will have a decrease in R shoulder pain to 3/10 or less at worst to promote ability to reach, open and close the door, lift items more comfortably.    Baseline 10/10 R shoulder pain at worst for the past 2 months (05/22/2021)    Time 8    Period Weeks    Status New    Target Date 07/19/21      PT LONG TERM GOAL #2   Title Pt will improve R shoulder flexion AROM to 150 degrees or more without pain to promote ability to reach.    Baseline 90 degrees R shoulder flexion AROM with pain (05/22/2021)    Time 8    Period Weeks    Status New    Target Date 07/19/21      PT LONG TERM GOAL #3   Title Pt will improve her R shoulder strength all planes by at least 1/2 MMT grade to promote ability to reach as well as lift items.    Baseline R shoulder flexion 4/5, abduction 4-/5, ER 4+/5, IR 4/5 (05/22/2021)    Time 8    Period Weeks    Status New    Target Date 07/19/21      PT LONG TERM GOAL #4   Title Pt will improve R shoulder FOTO score by at least 10 points as a demonstration of improved function.    Baseline R shoulder FOTO 49 (05/22/2021)    Time 8    Period Weeks    Status New    Target Date 07/19/21  Plan - 06/19/21 1233     Clinical Impression Statement Pt arrived late so session was adjusted accordingly. Continued working on decreasing muscle tension to R clavicle as well as improving scapular retraction to promote better positioning of R proximal clavicle. Fair tolerance to today's session. Pt will benefit from continued skilled  physical therapy services to decrease pain, improve strength and function.    Personal Factors and Comorbidities Age;Comorbidity 2    Comorbidities Arthritis, Mitral valve prolapse    Examination-Activity Limitations Lift;Reach Overhead;Carry;Bed Mobility    Stability/Clinical Decision Making Stable/Uncomplicated    Clinical Decision Making Low    Rehab Potential Fair    PT Frequency 2x / week    PT Duration 8 weeks    PT Treatment/Interventions Therapeutic activities;Therapeutic exercise;Manual techniques;Electrical Stimulation;Iontophoresis 4mg /ml Dexamethasone;Functional mobility training;Neuromuscular re-education;Patient/family education;Dry needling    PT Next Visit Plan scapular strengthening, gentle strengthening, stability, manual techniques, modalities PRN    PT Home Exercise Plan Medbridge Access Code [email or web address]    Consulted and Agree with Plan of Care Patient             Patient will benefit from skilled therapeutic intervention in order to improve the following deficits and impairments:  Pain, Improper body mechanics, Postural dysfunction, Decreased strength  Visit Diagnosis: Chronic right shoulder pain  Muscle weakness (generalized)     Problem List Patient Active Problem List   Diagnosis Date Noted   Hematoma 02/11/2021   Ankle swelling 11/26/2020   Low back pain 11/22/2020   Hip pain, right 11/22/2020   Light headedness 06/24/2020   Hearing loss 06/24/2020   Change in vision 06/24/2020   Stress 05/17/2020   Chest pain 05/16/2020   Welcome to Medicare preventive visit 06/20/2019   Viral syndrome 01/30/2019   Itching 07/21/2017   Asthma 07/16/2016   Urinary incontinence 07/16/2016   SOB (shortness of breath) 04/01/2016   Bilateral shoulder pain 02/24/2016   Fatigue 01/28/2016   Neck fullness 01/28/2016   Routine general medical examination at a health care facility 07/25/2015   Health care maintenance 10/09/2014   Neck pain 11/26/2013    Hypercholesterolemia 11/26/2013   History of colonic polyps 05/11/2013   Hyperbilirubinemia 01/18/2013   GERD (gastroesophageal reflux disease) 12/03/2012   Cholelithiasis 11/07/2012   IBS (irritable bowel syndrome) 11/07/2012   History of rheumatic fever 08/28/2012   MVP (mitral valve prolapse) 08/28/2012     Joneen Boers PT, DPT  06/19/2021, 12:46 PM  [facility] [address], [place], [address] Phone: [phone]   Fax:  [phone]  Name: Lynn Mann MRN: [id number] Date of Birth: [birth year]

## 2021-06-25 ENCOUNTER — Ambulatory Visit: Payer: PPO

## 2021-06-25 DIAGNOSIS — M6281 Muscle weakness (generalized): Secondary | ICD-10-CM

## 2021-06-25 DIAGNOSIS — M25511 Pain in right shoulder: Secondary | ICD-10-CM | POA: Diagnosis not present

## 2021-06-25 DIAGNOSIS — G8929 Other chronic pain: Secondary | ICD-10-CM

## 2021-06-25 NOTE — Therapy (Signed)
[facility] [address] [place], [address] Phone: [phone]   Fax:  [phone]  Physical Therapy Treatment And Progress Report (05/22/2021 - 06/25/2021)  Patient Details  Name: Lynn Mann MRN: [id number] Date of Birth: 1951/06/22 Referring Provider (PT): Rosalia Hammers, DO   Encounter Date: 06/25/2021   PT End of Session - 06/25/21 1545     Visit Number 10    Number of Visits 17    Date for PT Re-Evaluation 07/19/21    Authorization Type 10    Authorization Time Period 10    PT Start Time 1545    PT Stop Time 1626    PT Time Calculation (min) 41 min    Activity Tolerance Patient tolerated treatment well    Behavior During Therapy [facility] for tasks assessed/performed             Past Medical History:  Diagnosis Date   Arthritis    C. difficile diarrhea    age 37s-40s    Chicken pox    Cholecystitis 11/2011   Did not require sgy - Dr. Staci Acosta - [facility]  (cholelithiasis)   H/O Clostridium difficile infection    IBS (irritable bowel syndrome)    MRSA exposure 2005   Spider bite   MVP (mitral valve prolapse)    Stable - Dr. Ubaldo Glassing   Rheumatic fever     Past Surgical History:  Procedure Laterality Date   CATARACT EXTRACTION  [id number]   MUSCLE BIOPSY      There were no vitals filed for this visit.   Subjective Assessment - 06/25/21 1546     Subjective R shoulder is ok, no pain at rest.    Pertinent History R shoulder did not start really hurting until about a month after her fall. Just started with a little pain at the [facility] joint and clavicle. Sometimes she also feels pain along her R pectoralis muscle in the shape of a triangle. Pt fell June 2022. Pt got up to go to the bathroom and tripped over a suitcase. Pt fell against the garment rack on her R side which kind of shielded her R side. Pt also got a hematoma the size of a baked potato at her L leg afterwards. Pt hurt R toes during the  fall which got better after 3 days. Went to urgent care for her leg. Hematoma has improved but still has a tiny knot in her L leg. Pt is R hand dominant. Pt was in PT around May to July 2022 for for her R hip which is better.    Patient Stated Goals Not hurt.    Currently in Pain? No/denies    Pain Onset More than a month ago                                        PT Education - 06/25/21 1649     Education Details ther-ex    Person(s) Educated Patient    Methods Explanation;Demonstration;Tactile cues;Verbal cues    Comprehension Returned demonstration;Verbalized understanding             Objective      No blood pressure problems No latex allergies      TTP R [place] joint, sternal head on R slightly more palpable than L, TTP R calvicle and AC joint. Muscle tension R scalene area, upper trap  muscle area.      Medbridge Access Code [email or web address]   Has osteopenia   Therapeutic exercise   Standing R shoulder flexion AROM 1x  Reviewed progress/current status with PT towards goals.  Manually resisted R shoulder flexion, abduction, ER, IR 1x each way  R shoulder extension manually resisted from about 90 degrees flexion 10x2  147 degrees R shoulder flexion AROM after first set of 10x   Seated manually resisted scapular retraction targeting lower trap muscle   R 10x5 seconds for 3 sets  Standing B scapular retraction yellow band 10x3 with 5 second holds    Seated assisted R shoulder horizontal abduction with scapular retraction with gentle distraction. 10x5 seconds              Improved exercise technique, movement at target joints, use of target muscles after min to mod verbal, visual, tactile cues.      Manual therapy   Seated STM R proximal pectoralis muscles ust inferior to clavicle to decrease tension and R and L upper trap muscle         Response to treatment Fair tolerance to today's session.    Clinical impression Pt demonstrates  decreased R shoulder pain, improved AROM, strength, and function since initial evaluation. Pt making very good progress with PT towards goals. Pt will benefit from continued skilled physical therapy services to continue to decrease pain, improve ROM, strength and ability to perform tasks at home with her R UE.      PT Short Term Goals - 06/25/21 1646       PT SHORT TERM GOAL #1   Title Pt will be independent with her initial HEP to decrease pain, improve strength, function, and ability to reach, push, and pull more comfortably.    Baseline Pt has started her HEP (05/22/2021); Pt performing her HEP. Has some questions but forgot what they are (06/25/2021)    Time 3    Period Weeks    Status On-going    Target Date 06/14/21               PT Long Term Goals - 06/25/21 1547       PT LONG TERM GOAL #1   Title Pt will have a decrease in R shoulder pain to 3/10 or less at worst to promote ability to reach, open and close the door, lift items more comfortably.    Baseline 10/10 R shoulder pain at worst for the past 2 months (05/22/2021); 6/10 at worst for the past 7 days (06/25/2021)    Time 8    Period Weeks    Status Partially Met    Target Date 07/19/21      PT LONG TERM GOAL #2   Title Pt will improve R shoulder flexion AROM to 150 degrees or more without pain to promote ability to reach.    Baseline 90 degrees R shoulder flexion AROM with pain (05/22/2021); 123 degrees flexion AROM, 147 degrees after resisted extension exercise (06/25/2021)    Time 8    Period Weeks    Status Partially Met    Target Date 07/19/21      PT LONG TERM GOAL #3   Title Pt will improve her R shoulder strength all planes by at least 1/2 MMT grade to promote ability to reach as well as lift items.    Baseline R shoulder flexion 4/5, abduction 4-/5, ER 4+/5, IR 4/5 (05/22/2021); R shoulder flexion 4+/5, abduction 4/5, ER 4+/5, IR 4+/5 (06/25/2021)  Time 8    Period Weeks    Status Partially Met     Target Date 07/19/21      PT LONG TERM GOAL #4   Title Pt will improve R shoulder FOTO score by at least 10 points as a demonstration of improved function.    Baseline R shoulder FOTO 49 (05/22/2021); 59 (06/25/2021)    Time 8    Period Weeks    Status Achieved    Target Date 07/19/21                   Plan - 06/25/21 1545     Clinical Impression Statement Pt demonstrates decreased R shoulder pain, improved AROM, strength, and function since initial evaluation. Pt making very good progress with PT towards goals. Pt will benefit from continued skilled physical therapy services to continue to decrease pain, improve ROM, strength and ability to perform tasks at home with her R UE.    Personal Factors and Comorbidities Age;Comorbidity 2    Comorbidities Arthritis, Mitral valve prolapse    Examination-Activity Limitations Lift;Reach Overhead;Carry;Bed Mobility    Stability/Clinical Decision Making Stable/Uncomplicated    Rehab Potential Fair    PT Frequency 2x / week    PT Duration 8 weeks    PT Treatment/Interventions Therapeutic activities;Therapeutic exercise;Manual techniques;Electrical Stimulation;Iontophoresis 3m/ml Dexamethasone;Functional mobility training;Neuromuscular re-education;Patient/family education;Dry needling    PT Next Visit Plan scapular strengthening, gentle strengthening, stability, manual techniques, modalities PRN    PT Home Exercise Plan Medbridge Access Code [email or web address]    Consulted and Agree with Plan of Care Patient             Patient will benefit from skilled therapeutic intervention in order to improve the following deficits and impairments:  Pain, Improper body mechanics, Postural dysfunction, Decreased strength  Visit Diagnosis: Chronic right shoulder pain  Muscle weakness (generalized)     Problem List Patient Active Problem List   Diagnosis Date Noted   Hematoma 02/11/2021   Ankle swelling 11/26/2020   Low back pain 11/22/2020    Hip pain, right 11/22/2020   Light headedness 06/24/2020   Hearing loss 06/24/2020   Change in vision 06/24/2020   Stress 05/17/2020   Chest pain 05/16/2020   Welcome to Medicare preventive visit 06/20/2019   Viral syndrome 01/30/2019   Itching 07/21/2017   Asthma 07/16/2016   Urinary incontinence 07/16/2016   SOB (shortness of breath) 04/01/2016   Bilateral shoulder pain 02/24/2016   Fatigue 01/28/2016   Neck fullness 01/28/2016   Routine general medical examination at a health care facility 07/25/2015   Health care maintenance 10/09/2014   Neck pain 11/26/2013   Hypercholesterolemia 11/26/2013   History of colonic polyps 05/11/2013   Hyperbilirubinemia 01/18/2013   GERD (gastroesophageal reflux disease) 12/03/2012   Cholelithiasis 11/07/2012   IBS (irritable bowel syndrome) 11/07/2012   History of rheumatic fever 08/28/2012   MVP (mitral valve prolapse) 08/28/2012     MJoneen BoersPT, DPT   06/25/2021, 4:50 PM  [facility] [address] [place] [address]Phone: [phone]  Fax:  [phone] Name: Lynn REDDITTMRN: [id number] of Birth: 2August 10, 1952

## 2021-06-26 ENCOUNTER — Telehealth: Payer: Self-pay

## 2021-06-26 NOTE — Telephone Encounter (Signed)
Returned patient phone call. Pt states she felt a tearing sensation R chest area yesterday (Monday evening around 5-6 pm). Feels like a burning feeling currently. It just happened. No known method of injury. Pt was standing in the kitchen. The tearing sensation was behind the R breast area kind of like from the sternum bone to the arm pit. Did not notice any swelling or bruising. Just sore to touch at the moment. No difficulty breathing. Felt like duct tape peeling. Hurt for a little while, then ached yesterday, currently feels like a dull ache or burn in the middle of the breast area on the bottom. No difficulty breathing.   Had pt perform R cervical side bend 5x2 with 10 second holds Decreased ache. Possible increased puffiness at sternal area however. Pt was recommended to contact her MD secondary to unusual symptoms. Pt verbalized understanding.

## 2021-06-27 ENCOUNTER — Ambulatory Visit: Payer: PPO

## 2021-06-27 ENCOUNTER — Ambulatory Visit (INDEPENDENT_AMBULATORY_CARE_PROVIDER_SITE_OTHER): Payer: PPO

## 2021-06-27 VITALS — Ht 67.0 in | Wt 184.0 lb

## 2021-06-27 DIAGNOSIS — M25511 Pain in right shoulder: Secondary | ICD-10-CM | POA: Diagnosis not present

## 2021-06-27 DIAGNOSIS — Z0000 Encounter for general adult medical examination without abnormal findings: Secondary | ICD-10-CM | POA: Diagnosis not present

## 2021-06-27 DIAGNOSIS — M6281 Muscle weakness (generalized): Secondary | ICD-10-CM

## 2021-06-27 DIAGNOSIS — G8929 Other chronic pain: Secondary | ICD-10-CM

## 2021-06-27 NOTE — Progress Notes (Signed)
Subjective:   Lynn Mann is a 70 y.o. female who presents for Medicare Annual (Subsequent) preventive examination.  Review of Systems    No ROS.  Medicare Wellness Virtual Visit.  Visual/audio telehealth visit, UTA vital signs.   See social history for additional risk factors.   Cardiac Risk Factors include: advanced age (>84men, >17 women)     Objective:    Today's Vitals   06/27/21 1402  Weight: 184 lb (83.5 kg)  Height: 5\' 7"  (1.702 m)   Body mass index is 28.82 kg/m.  Advanced Directives 06/27/2021 01/31/2021 01/20/2021 06/26/2020 03/06/2020 04/03/2018 11/18/2017  Does Patient Have a Medical Advance Directive? No No No No No No No  Would patient like information on creating a medical advance directive? No - Patient declined - - No - Patient declined No - Patient declined No - Patient declined -    Current Medications (verified) Outpatient Encounter Medications as of 06/27/2021  Medication Sig   albuterol (VENTOLIN HFA) 108 (90 Base) MCG/ACT inhaler Inhale 2 puffs into the lungs every 6 (six) hours as needed for wheezing or shortness of breath.   aspirin 325 MG tablet Take by mouth.   b complex vitamins capsule Take 1 capsule by mouth daily.   CALCIUM PO Take by mouth.   Cholecalciferol (VITAMIN D3 PO) Take by mouth.   cyclobenzaprine (FLEXERIL) 5 MG tablet Take 1 tablet (5 mg total) by mouth 3 (three) times daily as needed for muscle spasms.   Multiple Vitamins-Minerals (PRESERVISION AREDS 2 PO) Take by mouth.   rosuvastatin (CRESTOR) 5 MG tablet Take 5 mg by mouth 2 (two) times a week.   No facility-administered encounter medications on file as of 06/27/2021.    Allergies (verified) Other, Adhesive [tape], Tartrazine, and Yellow dyes (non-tartrazine)   History: Past Medical History:  Diagnosis Date   Arthritis    C. difficile diarrhea    age 59s-40s    Chicken pox    Cholecystitis 11/2011   Did not require sgy - Dr. Staci Acosta - [facility]  (cholelithiasis)   H/O  Clostridium difficile infection    IBS (irritable bowel syndrome)    MRSA exposure 2005   Spider bite   MVP (mitral valve prolapse)    Stable - Dr. Ubaldo Glassing   Rheumatic fever    Past Surgical History:  Procedure Laterality Date   CATARACT EXTRACTION  [id number]   MUSCLE BIOPSY     Family History  Problem Relation Age of Onset   Arthritis Mother    Stroke Mother    Diabetes Mother    Hypertension Mother    Arthritis Father    Stroke Father    Diabetes Paternal Grandmother    Cancer Paternal Uncle        colon   Heart disease Other        maternal and paternal side   Breast cancer Paternal 37    Breast cancer Cousin    Breast cancer Cousin        female cousin   Breast cancer Cousin    Social History   Socioeconomic History   Marital status: Widowed    Spouse name: Not on file   Number of children: Not on file   Years of education: 16   Highest education level: Not on file  Occupational History   Occupation: Retired  Tobacco Use   Smoking status: Never   Smokeless tobacco: Never  Vaping Use   Vaping Use: Never used  Substance and Sexual  Activity   Alcohol use: Yes    Alcohol/week: 0.0 standard drinks    Comment: Rarely   Drug use: No   Sexual activity: Yes    Partners: Male    Birth control/protection: Post-menopausal  Other Topics Concern   Not on file  Social History Narrative   Not on file   Social Determinants of Health   Financial Resource Strain: Low Risk    Difficulty of Paying Living Expenses: Not hard at all  Food Insecurity: No Food Insecurity   Worried About Charity fundraiser in the Last Year: Never true   [name] in the Last Year: Never true  Transportation Needs: No Transportation Needs   Lack of Transportation (Medical): No   Lack of Transportation (Non-Medical): No  Physical Activity: Insufficiently Active   Days of Exercise per Week: 2 days   Minutes of Exercise per Session: 50 min  Stress: No Stress Concern Present   Feeling  of Stress : Not at all  Social Connections: Unknown   Frequency of Communication with Friends and Family: More than three times a week   Frequency of Social Gatherings with Friends and Family: More than three times a week   Attends Religious Services: Not on file   Active Member of Clubs or Organizations: Not on file   Attends Archivist Meetings: Not on file   Marital Status: Widowed    Tobacco Counseling Counseling given: Not Answered   Clinical Intake:  Pre-visit preparation completed: Yes        Diabetes: No  How often do you need to have someone help you when you read instructions, pamphlets, or other written materials from your doctor or pharmacy?: 1 - Never  Interpreter Needed?: No      Activities of Daily Living In your present state of health, do you have any difficulty performing the following activities: 06/27/2021  Hearing? N  Vision? N  Difficulty concentrating or making decisions? N  Walking or climbing stairs? N  Dressing or bathing? N  Doing errands, shopping? N  Preparing Food and eating ? N  Using the Toilet? N  In the past six months, have you accidently leaked urine? Y  Comment Managed with depend brief  Do you have problems with loss of bowel control? N  Managing your Medications? N  Managing your Finances? N  Housekeeping or managing your Housekeeping? N  Some recent data might be hidden    Patient Care Team: Einar Pheasant, MD as PCP - General (Internal Medicine)  Indicate any recent Medical Services you may have received from other than [facility] providers in the past year (date may be approximate).     Assessment:   This is a routine wellness examination for [name].  Virtual Visit via Telephone Note  I connected with  Lynn Mann on 06/27/21 at  2:00 PM EST by telephone and verified that I am speaking with the correct person using two identifiers.  Persons participating in the virtual visit: patient/Nurse Health  Advisor   I discussed the limitations, risks, security and privacy concerns of performing an evaluation and management service by telephone and the availability of in person appointments. The patient expressed understanding and agreed to proceed.  Interactive audio and video telecommunications were attempted between this nurse and patient, however failed, due to patient having technical difficulties OR patient did not have access to video capability.  We continued and completed visit with audio only.  Some vital signs may be  absent or patient reported.   Hearing/Vision screen Hearing Screening - Comments:: Hearing aids Does not wear them often Vision Screening - Comments:: Wears corrective lenses  Cataract extraction, bilateral  They have seen their ophthalmologist in the last 12 months.   Dietary issues and exercise activities discussed:   Regular diet   Goals Addressed               This Visit's Progress     Patient Stated     Weight goal 174lb (pt-stated)        Increase physical activity Lose 10lb      Other     Follow up with Primary Care Provider        As needed       Depression Screen PHQ 2/9 Scores 06/27/2021 06/06/2021 03/02/2021 06/26/2020 06/20/2020 06/17/2019 04/03/2018  PHQ - 2 Score 0 0 1 0 0 0 0    Fall Risk Fall Risk  06/06/2021 03/02/2021 06/26/2020 12/02/2019 06/18/2019  Falls in the past year? 1 1 0 0 0  Comment - - - - Emmi Telephone Survey: data to providers prior to load  Number falls in past yr: 0 0 0 0 -  Injury with Fall? 1 1 0 0 -  Follow up Falls evaluation completed Falls evaluation completed Falls evaluation completed Falls evaluation completed -    FALL RISK PREVENTION PERTAINING TO THE HOME: Home free of loose throw rugs in walkways, pet beds, electrical cords, etc? Yes  Adequate lighting in your home to reduce risk of falls? Yes   ASSISTIVE DEVICES UTILIZED TO PREVENT FALLS: Use of a cane, walker or w/c? No   TIMED UP AND GO: Was  the test performed? No .   Cognitive Function:  Patient is alert and oriented x3.      Immunizations Immunization History  Administered Date(s) Administered   [employer] Vaccination 10/28/2019, 11/18/2019   Tdap 07/25/2015   Screening Tests Health Maintenance  Topic Date Due   Zoster Vaccines- Shingrix (1 of 2) 09/06/2021 (Originally 09/12/2002)   COVID-19 Vaccine (3 - Booster) 03/28/2022 (Originally 01/13/2020)   Pneumonia Vaccine 52+ Years old (1 - PCV) 06/06/2022 (Originally 09/12/1956)   COLONOSCOPY (Pts 45-9yrs Insurance coverage will need to be confirmed)  05/06/2023   MAMMOGRAM  06/12/2023   TETANUS/TDAP  07/24/2025   DEXA SCAN  Completed   Hepatitis C Screening  Completed   HPV VACCINES  Aged Out   INFLUENZA VACCINE  Discontinued   Health Maintenance There are no preventive care reminders to display for this patient.  Lung Cancer Screening: (Low Dose CT Chest recommended if Age 71-80 years, 30 pack-year currently smoking OR have quit w/in 15years.) does not qualify.   Vision Screening: Recommended annual ophthalmology exams for early detection of glaucoma and other disorders of the eye.  Dental Screening: Recommended annual dental exams for proper oral hygiene  Community Resource Referral / Chronic Care Management: CRR required this visit?  No   CCM required this visit?  No      Plan:   Keep all routine maintenance appointments.   I have personally reviewed and noted the following in the patient's chart:   Medical and social history Use of alcohol, tobacco or illicit drugs  Current medications and supplements including opioid prescriptions. Not taking opioid.  Functional ability and status Nutritional status Physical activity Advanced directives List of other physicians Hospitalizations, surgeries, and ER visits in previous 12 months Vitals Screenings to include cognitive, depression, and falls Referrals and  appointments  In addition, I have  reviewed and discussed with patient certain preventive protocols, quality metrics, and best practice recommendations. A written personalized care plan for preventive services as well as general preventive health recommendations were provided to patient.     Varney Biles, LPN   02/16/8287

## 2021-06-27 NOTE — Therapy (Signed)
[facility] [address], [place], [address] Phone: [phone]   Fax:  [phone]  Physical Therapy Treatment  Patient Details  Name: Lynn Mann MRN: [id number] Date of Birth: 1951-03-09 Referring Provider (PT): Rosalia Hammers, DO   Encounter Date: 06/27/2021   PT End of Session - 06/27/21 0936     Visit Number 11    Number of Visits 17    Date for PT Re-Evaluation 07/19/21    Authorization Type 1    Authorization Time Period 10    PT Start Time 0936    PT Stop Time 1018    PT Time Calculation (min) 42 min    Activity Tolerance Patient tolerated treatment well    Behavior During Therapy [facility] for tasks assessed/performed             Past Medical History:  Diagnosis Date   Arthritis    C. difficile diarrhea    age 69s-40s    Chicken pox    Cholecystitis 11/2011   Did not require sgy - Dr. Staci Acosta - [facility]  (cholelithiasis)   H/O Clostridium difficile infection    IBS (irritable bowel syndrome)    MRSA exposure 2005   Spider bite   MVP (mitral valve prolapse)    Stable - Dr. Ubaldo Glassing   Rheumatic fever     Past Surgical History:  Procedure Laterality Date   CATARACT EXTRACTION  [id number]   MUSCLE BIOPSY      There were no vitals filed for this visit.   Subjective Assessment - 06/27/21 0937     Subjective R shoulder feels better today. The burning is gone. Pt also mentioned that she had fibrocystic breast disease when she was younger. Has been drinking coffee recently every day and wonders if she has a little cyst in there that may have played a factor. 0.5/10 R shoulder/clavicle pain currently. The swelling in the sternal area is a little less but something is still going on in there.    Pertinent History R shoulder did not start really hurting until about a month after her fall. Just started with a little pain at the [facility] joint and clavicle. Sometimes she also feels pain along her R  pectoralis muscle in the shape of a triangle. Pt fell June 2022. Pt got up to go to the bathroom and tripped over a suitcase. Pt fell against the garment rack on her R side which kind of shielded her R side. Pt also got a hematoma the size of a baked potato at her L leg afterwards. Pt hurt R toes during the fall which got better after 3 days. Went to urgent care for her leg. Hematoma has improved but still has a tiny knot in her L leg. Pt is R hand dominant. Pt was in PT around May to July 2022 for for her R hip which is better.    Patient Stated Goals Not hurt.    Currently in Pain? Yes    Pain Score 1     Pain Onset More than a month ago                                       Objective      No blood pressure problems No latex allergies      TTP R Jeffers joint, sternal head on R slightly more  palpable than L, TTP R calvicle and AC joint. Muscle tension R scalene area, upper trap muscle area.      Medbridge Access Code [email or web address]   Has osteopenia   Therapeutic exercise   Seated scapular retraction 10x5 seconds for 2 sets   R shoulder flexion AROM 10x3  No R breast symptoms     Improved exercise technique, movement at target joints, use of target muscles after min to mod verbal, visual, tactile cues.      Manual therapy   Seated STM R > L upper trap muscle area to decrease tension   Seated STM R > L cervical paraspinal muscles to decrease tension    TTP R sternocostal  joints around T5/T6  No reproduction of R pectoralis/sternocostal joints with P to A to R and L T1-T12      Response to treatment Pt tolerated session well without aggravation of symptoms.    Clinical impression Light session performed today secondary to recent onset of R breast area pain since Monday evening. Symptoms have improved since onset and pt maintained R shoulder AROM gains. TTP R T5/T6 sternocostal areas. Pt was still recommended to contact her doctor secondary to  unusual symptoms which involves tearing sensation. Pt verbalized understanding. Pt tolerated session well without aggravation of symptoms. Pt will benefit from continued skilled physical therapy services to decrease pain, improve R shoulder AROM, strength and function.     PT Short Term Goals - 06/25/21 1646       PT SHORT TERM GOAL #1   Title Pt will be independent with her initial HEP to decrease pain, improve strength, function, and ability to reach, push, and pull more comfortably.    Baseline Pt has started her HEP (05/22/2021); Pt performing her HEP. Has some questions but forgot what they are (06/25/2021)    Time 3    Period Weeks    Status On-going    Target Date 06/14/21               PT Long Term Goals - 06/25/21 1547       PT LONG TERM GOAL #1   Title Pt will have a decrease in R shoulder pain to 3/10 or less at worst to promote ability to reach, open and close the door, lift items more comfortably.    Baseline 10/10 R shoulder pain at worst for the past 2 months (05/22/2021); 6/10 at worst for the past 7 days (06/25/2021)    Time 8    Period Weeks    Status Partially Met    Target Date 07/19/21      PT LONG TERM GOAL #2   Title Pt will improve R shoulder flexion AROM to 150 degrees or more without pain to promote ability to reach.    Baseline 90 degrees R shoulder flexion AROM with pain (05/22/2021); 123 degrees flexion AROM, 147 degrees after resisted extension exercise (06/25/2021)    Time 8    Period Weeks    Status Partially Met    Target Date 07/19/21      PT LONG TERM GOAL #3   Title Pt will improve her R shoulder strength all planes by at least 1/2 MMT grade to promote ability to reach as well as lift items.    Baseline R shoulder flexion 4/5, abduction 4-/5, ER 4+/5, IR 4/5 (05/22/2021); R shoulder flexion 4+/5, abduction 4/5, ER 4+/5, IR 4+/5 (06/25/2021)    Time 8    Period Weeks    Status  Partially Met    Target Date 07/19/21      PT LONG TERM  GOAL #4   Title Pt will improve R shoulder FOTO score by at least 10 points as a demonstration of improved function.    Baseline R shoulder FOTO 49 (05/22/2021); 59 (06/25/2021)    Time 8    Period Weeks    Status Achieved    Target Date 07/19/21                   Plan - 06/27/21 1033     Clinical Impression Statement Light session performed today secondary to recent onset of R breast area pain since Monday evening. Symptoms have improved since onset and pt maintained R shoulder AROM gains. TTP R T5/T6 sternocostal areas. Pt was still recommended to contact her doctor secondary to unusual symptoms which involves tearing sensation. Pt verbalized understanding. Pt tolerated session well without aggravation of symptoms. Pt will benefit from continued skilled physical therapy services to decrease pain, improve R shoulder AROM, strength and function.    Personal Factors and Comorbidities Age;Comorbidity 2    Comorbidities Arthritis, Mitral valve prolapse    Examination-Activity Limitations Lift;Reach Overhead;Carry;Bed Mobility    Stability/Clinical Decision Making Stable/Uncomplicated    Clinical Decision Making Low    Rehab Potential Fair    PT Frequency 2x / week    PT Duration 8 weeks    PT Treatment/Interventions Therapeutic activities;Therapeutic exercise;Manual techniques;Electrical Stimulation;Iontophoresis 38m/ml Dexamethasone;Functional mobility training;Neuromuscular re-education;Patient/family education;Dry needling    PT Next Visit Plan scapular strengthening, gentle strengthening, stability, manual techniques, modalities PRN    PT Home Exercise Plan Medbridge Access Code [email or web address]    Consulted and Agree with Plan of Care Patient             Patient will benefit from skilled therapeutic intervention in order to improve the following deficits and impairments:  Pain, Improper body mechanics, Postural dysfunction, Decreased strength  Visit Diagnosis: Chronic right  shoulder pain  Muscle weakness (generalized)     Problem List Patient Active Problem List   Diagnosis Date Noted   Hematoma 02/11/2021   Ankle swelling 11/26/2020   Low back pain 11/22/2020   Hip pain, right 11/22/2020   Light headedness 06/24/2020   Hearing loss 06/24/2020   Change in vision 06/24/2020   Stress 05/17/2020   Chest pain 05/16/2020   Welcome to Medicare preventive visit 06/20/2019   Viral syndrome 01/30/2019   Itching 07/21/2017   Asthma 07/16/2016   Urinary incontinence 07/16/2016   SOB (shortness of breath) 04/01/2016   Bilateral shoulder pain 02/24/2016   Fatigue 01/28/2016   Neck fullness 01/28/2016   Routine general medical examination at a health care facility 07/25/2015   Health care maintenance 10/09/2014   Neck pain 11/26/2013   Hypercholesterolemia 11/26/2013   History of colonic polyps 05/11/2013   Hyperbilirubinemia 01/18/2013   GERD (gastroesophageal reflux disease) 12/03/2012   Cholelithiasis 11/07/2012   IBS (irritable bowel syndrome) 11/07/2012   History of rheumatic fever 08/28/2012   MVP (mitral valve prolapse) 08/28/2012   MJoneen BoersPT, DPT  06/27/2021, 10:34 AM  [facility] [address] [place] [address]Phone: [phone]  Fax:  [phone] Name: LCAMIA DIPINTOMRN: [id number] of Birth: [birth year]

## 2021-06-27 NOTE — Patient Instructions (Addendum)
Ms. Lynn Mann , Thank you for taking time to come for your Medicare Wellness Visit. I appreciate your ongoing commitment to your health goals. Please review the following plan we discussed and let me know if I can assist you in the future.   These are the goals we discussed:  Goals       Patient Stated     Weight goal 174lb (pt-stated)      Increase physical activity Lose 10lb      Other     Follow up with Primary Care Provider      As needed        This is a list of the screening recommended for you and due dates:  Health Maintenance  Topic Date Due   Zoster (Shingles) Vaccine (1 of 2) 09/06/2021*   COVID-19 Vaccine (3 - Booster) 03/28/2022*   Pneumonia Vaccine (1 - PCV) 06/06/2022*   Colon Cancer Screening  05/06/2023   Mammogram  06/12/2023   Tetanus Vaccine  07/24/2025   DEXA scan (bone density measurement)  Completed   Hepatitis C Screening: USPSTF Recommendation to screen - Ages 31-79 yo.  Completed   HPV Vaccine  Aged Out   Flu Shot  Discontinued  *Topic was postponed. The date shown is not the original due date.    Advanced directives: not yet completed  Conditions/risks identified: none new  Follow up in one year for your annual wellness visit    Preventive Care 65 Years and Older, Female Preventive care refers to lifestyle choices and visits with your health care provider that can promote health and wellness. What does preventive care include? A yearly physical exam. This is also called an annual well check. Dental exams once or twice a year. Routine eye exams. Ask your health care provider how often you should have your eyes checked. Personal lifestyle choices, including: Daily care of your teeth and gums. Regular physical activity. Eating a healthy diet. Avoiding tobacco and drug use. Limiting alcohol use. Practicing safe sex. Taking low-dose aspirin every day. Taking vitamin and mineral supplements as recommended by your health care  provider. What happens during an annual well check? The services and screenings done by your health care provider during your annual well check will depend on your age, overall health, lifestyle risk factors, and family history of disease. Counseling  Your health care provider may ask you questions about your: Alcohol use. Tobacco use. Drug use. Emotional well-being. Home and relationship well-being. Sexual activity. Eating habits. History of falls. Memory and ability to understand (cognition). Work and work Statistician. Reproductive health. Screening  You may have the following tests or measurements: Height, weight, and BMI. Blood pressure. Lipid and cholesterol levels. These may be checked every 5 years, or more frequently if you are over 33 years old. Skin check. Lung cancer screening. You may have this screening every year starting at age 22 if you have a 30-pack-year history of smoking and currently smoke or have quit within the past 15 years. Fecal occult blood test (FOBT) of the stool. You may have this test every year starting at age 8. Flexible sigmoidoscopy or colonoscopy. You may have a sigmoidoscopy every 5 years or a colonoscopy every 10 years starting at age 28. Hepatitis C blood test. Hepatitis B blood test. Sexually transmitted disease (STD) testing. Diabetes screening. This is done by checking your blood sugar (glucose) after you have not eaten for a while (fasting). You may have this done every 1-3 years. Bone density scan. This  is done to screen for osteoporosis. You may have this done starting at age 87. Mammogram. This may be done every 1-2 years. Talk to your health care provider about how often you should have regular mammograms. Talk with your health care provider about your test results, treatment options, and if necessary, the need for more tests. Vaccines  Your health care provider may recommend certain vaccines, such as: Influenza vaccine. This is  recommended every year. Tetanus, diphtheria, and acellular pertussis (Tdap, Td) vaccine. You may need a Td booster every 10 years. Zoster vaccine. You may need this after age 71. Pneumococcal 13-valent conjugate (PCV13) vaccine. One dose is recommended after age 63. Pneumococcal polysaccharide (PPSV23) vaccine. One dose is recommended after age 26. Talk to your health care provider about which screenings and vaccines you need and how often you need them. This information is not intended to replace advice given to you by your health care provider. Make sure you discuss any questions you have with your health care provider. Document Released: 08/11/2015 Document Revised: 04/03/2016 Document Reviewed: 05/16/2015 Elsevier Interactive Patient Education  2017 [place] Prevention in the Home Falls can cause injuries. They can happen to people of all ages. There are many things you can do to make your home safe and to help prevent falls. What can I do on the outside of my home? Regularly fix the edges of walkways and driveways and fix any cracks. Remove anything that might make you trip as you walk through a door, such as a raised step or threshold. Trim any bushes or trees on the path to your home. Use bright outdoor lighting. Clear any walking paths of anything that might make someone trip, such as rocks or tools. Regularly check to see if handrails are loose or broken. Make sure that both sides of any steps have handrails. Any raised decks and porches should have guardrails on the edges. Have any leaves, snow, or ice cleared regularly. Use sand or salt on walking paths during winter. Clean up any spills in your garage right away. This includes oil or grease spills. What can I do in the bathroom? Use night lights. Install grab bars by the toilet and in the tub and shower. Do not use towel bars as grab bars. Use non-skid mats or decals in the tub or shower. If you need to sit down in  the shower, use a plastic, non-slip stool. Keep the floor dry. Clean up any water that spills on the floor as soon as it happens. Remove soap buildup in the tub or shower regularly. Attach bath mats securely with double-sided non-slip rug tape. Do not have throw rugs and other things on the floor that can make you trip. What can I do in the bedroom? Use night lights. Make sure that you have a light by your bed that is easy to reach. Do not use any sheets or blankets that are too big for your bed. They should not hang down onto the floor. Have a firm chair that has side arms. You can use this for support while you get dressed. Do not have throw rugs and other things on the floor that can make you trip. What can I do in the kitchen? Clean up any spills right away. Avoid walking on wet floors. Keep items that you use a lot in easy-to-reach places. If you need to reach something above you, use a strong step stool that has a grab bar. Keep electrical cords out  of the way. Do not use floor polish or wax that makes floors slippery. If you must use wax, use non-skid floor wax. Do not have throw rugs and other things on the floor that can make you trip. What can I do with my stairs? Do not leave any items on the stairs. Make sure that there are handrails on both sides of the stairs and use them. Fix handrails that are broken or loose. Make sure that handrails are as long as the stairways. Check any carpeting to make sure that it is firmly attached to the stairs. Fix any carpet that is loose or worn. Avoid having throw rugs at the top or bottom of the stairs. If you do have throw rugs, attach them to the floor with carpet tape. Make sure that you have a light switch at the top of the stairs and the bottom of the stairs. If you do not have them, ask someone to add them for you. What else can I do to help prevent falls? Wear shoes that: Do not have high heels. Have rubber bottoms. Are comfortable  and fit you well. Are closed at the toe. Do not wear sandals. If you use a stepladder: Make sure that it is fully opened. Do not climb a closed stepladder. Make sure that both sides of the stepladder are locked into place. Ask someone to hold it for you, if possible. Clearly mark and make sure that you can see: Any grab bars or handrails. First and last steps. Where the edge of each step is. Use tools that help you move around (mobility aids) if they are needed. These include: Canes. Walkers. Scooters. Crutches. Turn on the lights when you go into a dark area. Replace any light bulbs as soon as they burn out. Set up your furniture so you have a clear path. Avoid moving your furniture around. If any of your floors are uneven, fix them. If there are any pets around you, be aware of where they are. Review your medicines with your doctor. Some medicines can make you feel dizzy. This can increase your chance of falling. Ask your doctor what other things that you can do to help prevent falls. This information is not intended to replace advice given to you by your health care provider. Make sure you discuss any questions you have with your health care provider. Document Released: 05/11/2009 Document Revised: 12/21/2015 Document Reviewed: 08/19/2014 Elsevier Interactive Patient Education  2017 [employer].

## 2021-07-02 ENCOUNTER — Telehealth: Payer: Self-pay

## 2021-07-02 ENCOUNTER — Ambulatory Visit: Payer: PPO

## 2021-07-02 NOTE — Telephone Encounter (Signed)
No show. Called patient and left a message pertaining to appointment and a reminder for the next follow up session.   

## 2021-07-04 ENCOUNTER — Ambulatory Visit: Payer: PPO | Attending: Sports Medicine

## 2021-07-04 DIAGNOSIS — G8929 Other chronic pain: Secondary | ICD-10-CM | POA: Diagnosis not present

## 2021-07-04 DIAGNOSIS — M25511 Pain in right shoulder: Secondary | ICD-10-CM | POA: Diagnosis not present

## 2021-07-04 DIAGNOSIS — M6281 Muscle weakness (generalized): Secondary | ICD-10-CM | POA: Diagnosis not present

## 2021-07-04 NOTE — Therapy (Signed)
[place] [facility] [address] [place], [address] Phone: [phone]   Fax:  [phone]  Physical Therapy Treatment  Patient Details  Name: Lynn Mann MRN: [id number] Date of Birth: Oct 10, 1950 Referring Provider (PT): Rosalia Hammers, DO   Encounter Date: 07/04/2021   PT End of Session - 07/04/21 0943     Visit Number 12    Number of Visits 17    Date for PT Re-Evaluation 07/19/21    Authorization Type 2    Authorization Time Period 10    PT Start Time [phone]    PT Stop Time 3    PT Time Calculation (min) 36 min    Activity Tolerance Patient tolerated treatment well    Behavior During Therapy [facility] for tasks assessed/performed             Past Medical History:  Diagnosis Date   Arthritis    C. difficile diarrhea    age 29s-40s    Chicken pox    Cholecystitis 11/2011   Did not require sgy - Dr. Staci Acosta - [facility]  (cholelithiasis)   H/O Clostridium difficile infection    IBS (irritable bowel syndrome)    MRSA exposure 2005   Spider bite   MVP (mitral valve prolapse)    Stable - Dr. Ubaldo Glassing   Rheumatic fever     Past Surgical History:  Procedure Laterality Date   CATARACT EXTRACTION  [id number]   MUSCLE BIOPSY      There were no vitals filed for this visit.   Subjective Assessment - 07/04/21 0944     Subjective The power in [place] went out.  R shoulder is doing ok. It aches.    Pertinent History R shoulder did not start really hurting until about a month after her fall. Just started with a little pain at the [facility] joint and clavicle. Sometimes she also feels pain along her R pectoralis muscle in the shape of a triangle. Pt fell June 2022. Pt got up to go to the bathroom and tripped over a suitcase. Pt fell against the garment rack on her R side which kind of shielded her R side. Pt also got a hematoma the size of a baked potato at her L leg afterwards. Pt hurt R toes during the fall which got  better after 3 days. Went to urgent care for her leg. Hematoma has improved but still has a tiny knot in her L leg. Pt is R hand dominant. Pt was in PT around May to July 2022 for for her R hip which is better.    Patient Stated Goals Not hurt.    Currently in Pain? Yes    Pain Score 3    when raising her arm. No pain at rest.   Pain Onset More than a month ago                                        PT Education - 07/04/21 0953     Education Details ther-ex    Person(s) Educated Patient    Methods Explanation;Demonstration;Tactile cues;Verbal cues    Comprehension Returned demonstration;Verbalized understanding            Objective      No blood pressure problems No latex allergies      TTP R [place] joint, sternal head on R slightly more palpable  than L, TTP R calvicle and AC joint. Muscle tension R scalene area, upper trap muscle area.      Medbridge Access Code [email or web address]   Has osteopenia   Therapeutic exercise  Standing B scapular retraction yellow band 10x3 with 5 seconds    Standing B shoulder ER yellow band 10x3  Standing manually resisted R triceps extension isometrics, PT manual resistance, R elbow at 90 degrees flexion 10x5 seconds  Standing chin tucks 10x5 seconds    Setaed scapular retraction 10x5 second s  R shoulder flexion AAROM with PT with resisted extention to starting postitoin 8x  Decreased R shoulder pain with flexion afterwards.       Improved exercise technique, movement at target joints, use of target muscles after min to mod verbal, visual, tactile cues.      Manual therapy   Seated STM R cervical paraspinal and upper trap muscle area to decrease tension         Response to treatment Fair tolerance to today's session.       Clinical impression Pt arrived late so session was adjusted accordingly. Worked on improving scapular and shoulder strength to improve mechanics with reaching. Decreased pain with R  shoulder flexion with treatment to promote scapular and posterior shoulder muscle activation. Fair tolerance to today's session. No R pectoralis symptoms (no burning or tearing sensation). Pt will benefit from continued skilled physical therapy services to decrease pain, improve R shoulder AROM, strength and function.      PT Short Term Goals - 06/25/21 1646       PT SHORT TERM GOAL #1   Title Pt will be independent with her initial HEP to decrease pain, improve strength, function, and ability to reach, push, and pull more comfortably.    Baseline Pt has started her HEP (05/22/2021); Pt performing her HEP. Has some questions but forgot what they are (06/25/2021)    Time 3    Period Weeks    Status On-going    Target Date 06/14/21               PT Long Term Goals - 06/25/21 1547       PT LONG TERM GOAL #1   Title Pt will have a decrease in R shoulder pain to 3/10 or less at worst to promote ability to reach, open and close the door, lift items more comfortably.    Baseline 10/10 R shoulder pain at worst for the past 2 months (05/22/2021); 6/10 at worst for the past 7 days (06/25/2021)    Time 8    Period Weeks    Status Partially Met    Target Date 07/19/21      PT LONG TERM GOAL #2   Title Pt will improve R shoulder flexion AROM to 150 degrees or more without pain to promote ability to reach.    Baseline 90 degrees R shoulder flexion AROM with pain (05/22/2021); 123 degrees flexion AROM, 147 degrees after resisted extension exercise (06/25/2021)    Time 8    Period Weeks    Status Partially Met    Target Date 07/19/21      PT LONG TERM GOAL #3   Title Pt will improve her R shoulder strength all planes by at least 1/2 MMT grade to promote ability to reach as well as lift items.    Baseline R shoulder flexion 4/5, abduction 4-/5, ER 4+/5, IR 4/5 (05/22/2021); R shoulder flexion 4+/5, abduction 4/5, ER 4+/5, IR 4+/5 (06/25/2021)  Time 8    Period Weeks    Status Partially  Met    Target Date 07/19/21      PT LONG TERM GOAL #4   Title Pt will improve R shoulder FOTO score by at least 10 points as a demonstration of improved function.    Baseline R shoulder FOTO 49 (05/22/2021); 59 (06/25/2021)    Time 8    Period Weeks    Status Achieved    Target Date 07/19/21                   Plan - 07/04/21 0943     Clinical Impression Statement Pt arrived late so session was adjusted accordingly. Worked on improving scapular and shoulder strength to improve mechanics with reaching. Decreased pain with R shoulder flexion with treatment to promote scapular and posterior shoulder muscle activation. Fair tolerance to today's session. No R pectoralis symptoms (no burning or tearing sensation). Pt will benefit from continued skilled physical therapy services to decrease pain, improve R shoulder AROM, strength and function.    Personal Factors and Comorbidities Age;Comorbidity 2    Comorbidities Arthritis, Mitral valve prolapse    Examination-Activity Limitations Lift;Reach Overhead;Carry;Bed Mobility    Stability/Clinical Decision Making Stable/Uncomplicated    Clinical Decision Making Low    Rehab Potential Fair    PT Frequency 2x / week    PT Duration 8 weeks    PT Treatment/Interventions Therapeutic activities;Therapeutic exercise;Manual techniques;Electrical Stimulation;Iontophoresis 39m/ml Dexamethasone;Functional mobility training;Neuromuscular re-education;Patient/family education;Dry needling    PT Next Visit Plan scapular strengthening, gentle strengthening, stability, manual techniques, modalities PRN    PT Home Exercise Plan Medbridge Access Code [email or web address]    Consulted and Agree with Plan of Care Patient             Patient will benefit from skilled therapeutic intervention in order to improve the following deficits and impairments:  Pain, Improper body mechanics, Postural dysfunction, Decreased strength  Visit Diagnosis: Chronic right shoulder  pain     Problem List Patient Active Problem List   Diagnosis Date Noted   Hematoma 02/11/2021   Ankle swelling 11/26/2020   Low back pain 11/22/2020   Hip pain, right 11/22/2020   Light headedness 06/24/2020   Hearing loss 06/24/2020   Change in vision 06/24/2020   Stress 05/17/2020   Chest pain 05/16/2020   Welcome to Medicare preventive visit 06/20/2019   Viral syndrome 01/30/2019   Itching 07/21/2017   Asthma 07/16/2016   Urinary incontinence 07/16/2016   SOB (shortness of breath) 04/01/2016   Bilateral shoulder pain 02/24/2016   Fatigue 01/28/2016   Neck fullness 01/28/2016   Routine general medical examination at a health care facility 07/25/2015   Health care maintenance 10/09/2014   Neck pain 11/26/2013   Hypercholesterolemia 11/26/2013   History of colonic polyps 05/11/2013   Hyperbilirubinemia 01/18/2013   GERD (gastroesophageal reflux disease) 12/03/2012   Cholelithiasis 11/07/2012   IBS (irritable bowel syndrome) 11/07/2012   History of rheumatic fever 08/28/2012   MVP (mitral valve prolapse) 08/28/2012    MJoneen BoersPT, DPT  07/04/2021, 1:39 PM  [place] [facility] [address] [place] [address]Phone: [phone]  Fax:  [phone] Name: LBREHANNA DEVENYMRN: [id number] of Birth: 220-Oct-1952

## 2021-07-09 ENCOUNTER — Ambulatory Visit: Payer: PPO

## 2021-07-09 DIAGNOSIS — G8929 Other chronic pain: Secondary | ICD-10-CM

## 2021-07-09 DIAGNOSIS — M25511 Pain in right shoulder: Secondary | ICD-10-CM | POA: Diagnosis not present

## 2021-07-09 DIAGNOSIS — M6281 Muscle weakness (generalized): Secondary | ICD-10-CM

## 2021-07-09 NOTE — Therapy (Signed)
[place] [facility] [address] [place], [address] Phone: [phone]   Fax:  [phone]  Physical Therapy Treatment  Patient Details  Name: Lynn Mann MRN: [id number] Date of Birth: 12-12-1950 Referring Provider (PT): Rosalia Hammers, DO   Encounter Date: 07/09/2021   PT End of Session - 07/09/21 1106     Visit Number 13    Number of Visits 17    Date for PT Re-Evaluation 07/19/21    Authorization Type 3    Authorization Time Period 10    PT Start Time 1106    PT Stop Time 1150    PT Time Calculation (min) 44 min    Activity Tolerance Patient tolerated treatment well    Behavior During Therapy [facility] for tasks assessed/performed             Past Medical History:  Diagnosis Date   Arthritis    C. difficile diarrhea    age 62s-40s    Chicken pox    Cholecystitis 11/2011   Did not require sgy - Dr. Staci Acosta - [facility]  (cholelithiasis)   H/O Clostridium difficile infection    IBS (irritable bowel syndrome)    MRSA exposure 2005   Spider bite   MVP (mitral valve prolapse)    Stable - Dr. Ubaldo Glassing   Rheumatic fever     Past Surgical History:  Procedure Laterality Date   CATARACT EXTRACTION  [id number]   MUSCLE BIOPSY      There were no vitals filed for this visit.   Subjective Assessment - 07/09/21 1107     Subjective Has been doing a lot of chores which is hard. 3/10 L calvical when raising her arm up. No pain at rest. The laying on her back aggravates her R shoulder. Had a pop R shoulder yesterday which felt good but has been hurting since then. Seeing Dr Einar Pheasant tomorrow for her R breast symptoms.    Pertinent History R shoulder did not start really hurting until about a month after her fall. Just started with a little pain at the [facility] joint and clavicle. Sometimes she also feels pain along her R pectoralis muscle in the shape of a triangle. Pt fell June 2022. Pt got up to go to the bathroom  and tripped over a suitcase. Pt fell against the garment rack on her R side which kind of shielded her R side. Pt also got a hematoma the size of a baked potato at her L leg afterwards. Pt hurt R toes during the fall which got better after 3 days. Went to urgent care for her leg. Hematoma has improved but still has a tiny knot in her L leg. Pt is R hand dominant. Pt was in PT around May to July 2022 for for her R hip which is better.    Patient Stated Goals Not hurt.    Currently in Pain? Yes    Pain Score 3     Pain Onset More than a month ago                                        PT Education - 07/09/21 1720     Education Details ther-ex    Person(s) Educated Patient    Methods Explanation;Demonstration;Tactile cues;Verbal cues    Comprehension Returned demonstration;Verbalized understanding  Objective      No blood pressure problems No latex allergies      TTP R [place] joint, sternal head on R slightly more palpable than L, TTP R calvicle and AC joint. Muscle tension R scalene area, upper trap muscle area.      Medbridge Access Code [email or web address]   Has osteopenia   Therapeutic exercise   Standing B scapular retraction yellow band 10x5 seconds, then 5x5 seconds  R clavicle discomfort  Seated chin tucks 10x5 seconds   Seated gentle manually resisted gentle L cervical side bend isometrics in neutral to promote proper posture 10x5 seconds for 3 sets. Decreased R lateral neck discomfort.    R shoulder flexion AAROM with PT with resisted extention to starting position 8x         then with pressing toungue a roof or mouth to activate anterior cervical muscles 10x5 seconds, then 8x5 seconds  Improved comfort level with R shoulder flexion AROM      Improved exercise technique, movement at target joints, use of target muscles after min to mod verbal, visual, tactile cues.      Manual therapy   Seated STM R cervical paraspinal and R  and L  upper trap muscle area to decrease tension and fascial restrictions. Decreased symptoms with medial to lateral fascial movement L upper trap muscle.         Response to treatment Pt tolerated session well without aggravation of symptoms.        Clinical impression Improved comfort level with R shoulder flexion AROM with activation of anterior cervical muscles as well as decreasing muscle tension to posterior neck and upper trap. Pt tolerated session well without aggravation of symptoms.  Pt will benefit from continued skilled physical therapy services to decrease pain, improve R shoulder AROM, strength and function.       PT Short Term Goals - 06/25/21 1646       PT SHORT TERM GOAL #1   Title Pt will be independent with her initial HEP to decrease pain, improve strength, function, and ability to reach, push, and pull more comfortably.    Baseline Pt has started her HEP (05/22/2021); Pt performing her HEP. Has some questions but forgot what they are (06/25/2021)    Time 3    Period Weeks    Status On-going    Target Date 06/14/21               PT Long Term Goals - 06/25/21 1547       PT LONG TERM GOAL #1   Title Pt will have a decrease in R shoulder pain to 3/10 or less at worst to promote ability to reach, open and close the door, lift items more comfortably.    Baseline 10/10 R shoulder pain at worst for the past 2 months (05/22/2021); 6/10 at worst for the past 7 days (06/25/2021)    Time 8    Period Weeks    Status Partially Met    Target Date 07/19/21      PT LONG TERM GOAL #2   Title Pt will improve R shoulder flexion AROM to 150 degrees or more without pain to promote ability to reach.    Baseline 90 degrees R shoulder flexion AROM with pain (05/22/2021); 123 degrees flexion AROM, 147 degrees after resisted extension exercise (06/25/2021)    Time 8    Period Weeks    Status Partially Met    Target Date 07/19/21  PT LONG TERM GOAL #3   Title Pt  will improve her R shoulder strength all planes by at least 1/2 MMT grade to promote ability to reach as well as lift items.    Baseline R shoulder flexion 4/5, abduction 4-/5, ER 4+/5, IR 4/5 (05/22/2021); R shoulder flexion 4+/5, abduction 4/5, ER 4+/5, IR 4+/5 (06/25/2021)    Time 8    Period Weeks    Status Partially Met    Target Date 07/19/21      PT LONG TERM GOAL #4   Title Pt will improve R shoulder FOTO score by at least 10 points as a demonstration of improved function.    Baseline R shoulder FOTO 49 (05/22/2021); 59 (06/25/2021)    Time 8    Period Weeks    Status Achieved    Target Date 07/19/21                   Plan - 07/09/21 1721     Clinical Impression Statement Improved comfort level with R shoulder flexion AROM with activation of anterior cervical muscles as well as decreasing muscle tension to posterior neck and upper trap. Pt tolerated session well without aggravation of symptoms.  Pt will benefit from continued skilled physical therapy services to decrease pain, improve R shoulder AROM, strength and function.    Personal Factors and Comorbidities Age;Comorbidity 2    Comorbidities Arthritis, Mitral valve prolapse    Examination-Activity Limitations Lift;Reach Overhead;Carry;Bed Mobility    Stability/Clinical Decision Making Stable/Uncomplicated    Clinical Decision Making Low    Rehab Potential Fair    PT Frequency 2x / week    PT Duration 8 weeks    PT Treatment/Interventions Therapeutic activities;Therapeutic exercise;Manual techniques;Electrical Stimulation;Iontophoresis 73m/ml Dexamethasone;Functional mobility training;Neuromuscular re-education;Patient/family education;Dry needling    PT Next Visit Plan scapular strengthening, gentle strengthening, stability, manual techniques, modalities PRN    PT Home Exercise Plan Medbridge Access Code [email or web address]    Consulted and Agree with Plan of Care Patient             Patient will benefit from skilled  therapeutic intervention in order to improve the following deficits and impairments:  Pain, Improper body mechanics, Postural dysfunction, Decreased strength  Visit Diagnosis: Chronic right shoulder pain  Muscle weakness (generalized)     Problem List Patient Active Problem List   Diagnosis Date Noted   Hematoma 02/11/2021   Ankle swelling 11/26/2020   Low back pain 11/22/2020   Hip pain, right 11/22/2020   Light headedness 06/24/2020   Hearing loss 06/24/2020   Change in vision 06/24/2020   Stress 05/17/2020   Chest pain 05/16/2020   Welcome to Medicare preventive visit 06/20/2019   Viral syndrome 01/30/2019   Itching 07/21/2017   Asthma 07/16/2016   Urinary incontinence 07/16/2016   SOB (shortness of breath) 04/01/2016   Bilateral shoulder pain 02/24/2016   Fatigue 01/28/2016   Neck fullness 01/28/2016   Routine general medical examination at a health care facility 07/25/2015   Health care maintenance 10/09/2014   Neck pain 11/26/2013   Hypercholesterolemia 11/26/2013   History of colonic polyps 05/11/2013   Hyperbilirubinemia 01/18/2013   GERD (gastroesophageal reflux disease) 12/03/2012   Cholelithiasis 11/07/2012   IBS (irritable bowel syndrome) 11/07/2012   History of rheumatic fever 08/28/2012   MVP (mitral valve prolapse) 08/28/2012    MJoneen BoersPT, DPT  07/09/2021, 6:24 PM  [facility] [facility] [address] [place] [address]  Phone: [phone]   Fax:  [phone]  Name: Lynn Mann MRN: [id number] Date of Birth: 11-26-1950

## 2021-07-10 ENCOUNTER — Ambulatory Visit (INDEPENDENT_AMBULATORY_CARE_PROVIDER_SITE_OTHER): Payer: PPO | Admitting: Internal Medicine

## 2021-07-10 ENCOUNTER — Other Ambulatory Visit: Payer: Self-pay

## 2021-07-10 ENCOUNTER — Telehealth: Payer: Self-pay | Admitting: Internal Medicine

## 2021-07-10 DIAGNOSIS — E7800 Pure hypercholesterolemia, unspecified: Secondary | ICD-10-CM | POA: Diagnosis not present

## 2021-07-10 DIAGNOSIS — M25511 Pain in right shoulder: Secondary | ICD-10-CM

## 2021-07-10 DIAGNOSIS — T148XXA Other injury of unspecified body region, initial encounter: Secondary | ICD-10-CM | POA: Diagnosis not present

## 2021-07-10 DIAGNOSIS — J4520 Mild intermittent asthma, uncomplicated: Secondary | ICD-10-CM | POA: Diagnosis not present

## 2021-07-10 NOTE — Telephone Encounter (Signed)
Pt called in regards to appt she has scheduled today 12/13 at 3:30 with PCP. Pt wanted to call in and let PCP know to look at her hematoma. Pt forgot to call in earlier.

## 2021-07-10 NOTE — Telephone Encounter (Signed)
Noted  

## 2021-07-10 NOTE — Progress Notes (Signed)
Patient ID: SHALONA HARBOUR, female   DOB: 11/02/1950, 70 y.o.   MRN: [id number]   Subjective:    Patient ID: [name], female    DOB: 12-01-1950, 70 y.o.   MRN: [id number]  This visit occurred during the SARS-CoV-2 public health emergency.  Safety protocols were in place, including screening questions prior to the visit, additional usage of staff PPE, and extensive cleaning of exam room while observing appropriate contact time as indicated for disinfecting solutions.   Patient here for work in appt   HPI Work in for pain right shoulder/anterior chest - right side sternum.  Previous fall.  Has been going to PT.  Was standing in kitchen and noticed a "tearing sensation" - right breast/anterior chest.  No injury or fall at that time.  Pain with various position changes and movements.  No pain with deep breathing.  No chest pain or sob with increased activity or exertion.  Posture affects.  Continuing to go to PT.  Had hematomoa - left lower leg. Improved.  Eating.  No nausea or vomiting reported.  No abdominal pain or bowel change reported.     Past Medical History:  Diagnosis Date   Arthritis    C. difficile diarrhea    age 29s-40s    Chicken pox    Cholecystitis 11/2011   Did not require sgy - Dr. Staci Acosta - [facility]  (cholelithiasis)   H/O Clostridium difficile infection    IBS (irritable bowel syndrome)    MRSA exposure 2005   Spider bite   MVP (mitral valve prolapse)    Stable - Dr. Ubaldo Glassing   Rheumatic fever    Past Surgical History:  Procedure Laterality Date   CATARACT EXTRACTION  [id number]   MUSCLE BIOPSY     Family History  Problem Relation Age of Onset   Arthritis Mother    Stroke Mother    Diabetes Mother    Hypertension Mother    Arthritis Father    Stroke Father    Diabetes Paternal Grandmother    Cancer Paternal Uncle        colon   Heart disease Other        maternal and paternal side   Breast cancer Paternal 62    Breast cancer Cousin    Breast cancer  Cousin        female cousin   Breast cancer Cousin    Social History   Socioeconomic History   Marital status: Widowed    Spouse name: Not on file   Number of children: Not on file   Years of education: 16   Highest education level: Not on file  Occupational History   Occupation: Retired  Tobacco Use   Smoking status: Never   Smokeless tobacco: Never  Vaping Use   Vaping Use: Never used  Substance and Sexual Activity   Alcohol use: Yes    Alcohol/week: 0.0 standard drinks    Comment: Rarely   Drug use: No   Sexual activity: Yes    Partners: Male    Birth control/protection: Post-menopausal  Other Topics Concern   Not on file  Social History Narrative   Not on file   Social Determinants of Health   Financial Resource Strain: Low Risk    Difficulty of Paying Living Expenses: Not hard at all  Food Insecurity: No Food Insecurity   Worried About Charity fundraiser in the Last Year: Never true   [place] in the Last  Year: Never true  Transportation Needs: No Transportation Needs   Lack of Transportation (Medical): No   Lack of Transportation (Non-Medical): No  Physical Activity: Insufficiently Active   Days of Exercise per Week: 2 days   Minutes of Exercise per Session: 50 min  Stress: No Stress Concern Present   Feeling of Stress : Not at all  Social Connections: Unknown   Frequency of Communication with Friends and Family: More than three times a week   Frequency of Social Gatherings with Friends and Family: More than three times a week   Attends Religious Services: Not on file   Active Member of Clubs or Organizations: Not on file   Attends Archivist Meetings: Not on file   Marital Status: Widowed     Review of Systems  Constitutional:  Negative for appetite change and unexpected weight change.  HENT:  Negative for congestion and sinus pressure.   Respiratory:  Negative for cough, chest tightness and shortness of breath.   Cardiovascular:   Negative for chest pain, palpitations and leg swelling.  Gastrointestinal:  Negative for abdominal pain, diarrhea, nausea and vomiting.  Genitourinary:  Negative for difficulty urinating and dysuria.  Musculoskeletal:  Negative for myalgias.       Right shoulder and anterior chest (ant pectoralis muscle) discomfort.  Aggravated by posture and positions.   Skin:  Negative for color change and rash.  Neurological:  Negative for dizziness, light-headedness and headaches.  Psychiatric/Behavioral:  Negative for agitation and dysphoric mood.       Objective:     BP 112/68    Pulse 78    Temp 97.8 F (36.6 C)    Resp 16    Ht 5\' 7"  (1.702 m)    Wt 191 lb 6.4 oz (86.8 kg)    SpO2 99%    BMI 29.98 kg/m  Wt Readings from Last 3 Encounters:  07/10/21 191 lb 6.4 oz (86.8 kg)  06/27/21 184 lb (83.5 kg)  06/06/21 184 lb 6.4 oz (83.6 kg)    Physical Exam Vitals reviewed.  Constitutional:      General: She is not in acute distress.    Appearance: Normal appearance.  HENT:     Head: Normocephalic and atraumatic.     Right Ear: External ear normal.     Left Ear: External ear normal.  Eyes:     General: No scleral icterus.       Right eye: No discharge.        Left eye: No discharge.     Conjunctiva/sclera: Conjunctivae normal.  Neck:     Thyroid: No thyromegaly.  Cardiovascular:     Rate and Rhythm: Normal rate and regular rhythm.  Pulmonary:     Effort: No respiratory distress.     Breath sounds: Normal breath sounds. No wheezing.  Abdominal:     General: Bowel sounds are normal.     Palpations: Abdomen is soft.     Tenderness: There is no abdominal tenderness.  Musculoskeletal:        General: No swelling.     Cervical back: Neck supple. No tenderness.     Comments: Increased discomfort with palpation - around clavicle and inferior.  Increased discomfort with increased rom.  No swelling.    Lymphadenopathy:     Cervical: No cervical adenopathy.  Skin:    Findings: No erythema or  rash.     Comments: Resolving hematoma - left lower leg.   Neurological:     Mental  Status: She is alert.  Psychiatric:        Mood and Affect: Mood normal.        Behavior: Behavior normal.     Outpatient Encounter Medications as of 07/10/2021  Medication Sig   albuterol (VENTOLIN HFA) 108 (90 Base) MCG/ACT inhaler Inhale 2 puffs into the lungs every 6 (six) hours as needed for wheezing or shortness of breath.   aspirin 325 MG tablet Take by mouth.   b complex vitamins capsule Take 1 capsule by mouth daily.   CALCIUM PO Take by mouth.   Cholecalciferol (VITAMIN D3 PO) Take by mouth.   cyclobenzaprine (FLEXERIL) 5 MG tablet Take 1 tablet (5 mg total) by mouth 3 (three) times daily as needed for muscle spasms.   Multiple Vitamins-Minerals (PRESERVISION AREDS 2 PO) Take by mouth.   rosuvastatin (CRESTOR) 5 MG tablet Take 5 mg by mouth 2 (two) times a week.   No facility-administered encounter medications on file as of 07/10/2021.     Lab Results  Component Value Date   WBC 5.5 06/06/2021   HGB 13.1 06/06/2021   HCT 39.5 06/06/2021   PLT 286.0 06/06/2021   GLUCOSE 91 06/06/2021   CHOL 200 06/06/2021   TRIG 92.0 06/06/2021   HDL 39.90 06/06/2021   LDLDIRECT 110.0 02/11/2017   LDLCALC 141 (H) 06/06/2021   ALT 16 06/06/2021   AST 24 06/06/2021   NA 140 06/06/2021   K 4.3 06/06/2021   CL 105 06/06/2021   CREATININE 0.65 06/06/2021   BUN 13 06/06/2021   CO2 27 06/06/2021   TSH 1.96 06/06/2021   HGBA1C 5.4 07/15/2017    MM 3D SCREEN BREAST BILATERAL  Result Date: 06/11/2021 CLINICAL DATA:  Screening. EXAM: DIGITAL SCREENING BILATERAL MAMMOGRAM WITH TOMOSYNTHESIS AND CAD TECHNIQUE: Bilateral screening digital craniocaudal and mediolateral oblique mammograms were obtained. Bilateral screening digital breast tomosynthesis was performed. The images were evaluated with computer-aided detection. COMPARISON:  Previous exam(s). ACR Breast Density Category b: There are scattered  areas of fibroglandular density. FINDINGS: There are no findings suspicious for malignancy. IMPRESSION: No mammographic evidence of malignancy. A result letter of this screening mammogram will be mailed directly to the patient. RECOMMENDATION: Screening mammogram in one year. (Code:SM-B-01Y) BI-RADS CATEGORY  1: Negative. Electronically Signed   By: Dorise Bullion III M.D.   On: 06/11/2021 18:37      Assessment & Plan:   Problem List Items Addressed This Visit     Asthma    Breathing stable.        Hematoma    Resolving.  Much improved.  She would like ortho opinion as well.  Follow.       Hypercholesterolemia    The 10-year ASCVD risk score (Arnett DK, et al., 2019) is: 6.5%   Values used to calculate the score:     Age: 19 years     Sex: Female     Is Non-Hispanic African American: No     Diabetic: No     Tobacco smoker: No     Systolic Blood Pressure: 409 mmHg     Is BP treated: No     HDL Cholesterol: 39.9 mg/dL     Total Cholesterol: 200 mg/dL  Have discussed calculated cholesterol risk.  Have discussed recommendation for medication.  Has wanted to work on diet and exercise.  Follow lipid panel.       Right shoulder pain    Right shoulder and anterior chest pain as outlined.  Appears to be  more msk.  Has been going to PT.  Will refer to ortho for further evaluation and to discuss other treatment recommendations.       Relevant Orders   Ambulatory referral to Orthopedic Surgery    I spent more than 25 minutes with the patient and more than 50% of the time was spent in consultation regarding the above.   Time spent discussing her current concerns and symptoms.  Time also spent discussing treatment and recommendations for further evaluation.   Einar Pheasant, MD

## 2021-07-11 ENCOUNTER — Ambulatory Visit: Payer: PPO

## 2021-07-11 DIAGNOSIS — M25511 Pain in right shoulder: Secondary | ICD-10-CM

## 2021-07-11 DIAGNOSIS — M6281 Muscle weakness (generalized): Secondary | ICD-10-CM

## 2021-07-11 DIAGNOSIS — G8929 Other chronic pain: Secondary | ICD-10-CM

## 2021-07-11 NOTE — Therapy (Signed)
[facility] [address] [place], [address] Phone: [phone]   Fax:  [phone]  Physical Therapy Treatment  Patient Details  Name: Lynn Mann MRN: [id number] Date of Birth: 1951/01/10 Referring Provider (PT): Rosalia Hammers, DO   Encounter Date: 07/11/2021   PT End of Session - 07/11/21 0937     Visit Number 14    Number of Visits 17    Date for PT Re-Evaluation 07/19/21    Authorization Type 4    Authorization Time Period 10    PT Start Time 0937    PT Stop Time 1018    PT Time Calculation (min) 41 min    Activity Tolerance Patient tolerated treatment well    Behavior During Therapy [facility] for tasks assessed/performed             Past Medical History:  Diagnosis Date   Arthritis    C. difficile diarrhea    age 19s-40s    Chicken pox    Cholecystitis 11/2011   Did not require sgy - Dr. Staci Acosta - [facility]  (cholelithiasis)   H/O Clostridium difficile infection    IBS (irritable bowel syndrome)    MRSA exposure 2005   Spider bite   MVP (mitral valve prolapse)    Stable - Dr. Ubaldo Glassing   Rheumatic fever     Past Surgical History:  Procedure Laterality Date   CATARACT EXTRACTION  [id number]   MUSCLE BIOPSY      There were no vitals filed for this visit.   Subjective Assessment - 07/11/21 0939     Subjective R shoulder is a little bit achy. Had bad coughing last night and tried to sleep propped up on her back which is not good for her R shoulder. No fevers. 2/10 ache currently. Dr. Nicki Reaper said that Dr. Candelaria Stagers should look at it. Dr. Nicki Reaper said that she should continue PT.    Pertinent History R shoulder did not start really hurting until about a month after her fall. Just started with a little pain at the [facility] joint and clavicle. Sometimes she also feels pain along her R pectoralis muscle in the shape of a triangle. Pt fell June 2022. Pt got up to go to the bathroom and tripped over a  suitcase. Pt fell against the garment rack on her R side which kind of shielded her R side. Pt also got a hematoma the size of a baked potato at her L leg afterwards. Pt hurt R toes during the fall which got better after 3 days. Went to urgent care for her leg. Hematoma has improved but still has a tiny knot in her L leg. Pt is R hand dominant. Pt was in PT around May to July 2022 for for her R hip which is better.    Patient Stated Goals Not hurt.    Currently in Pain? Yes    Pain Score 2     Pain Onset More than a month ago                                        PT Education - 07/11/21 1002     Education Details ther-ex    [employer]) Educated Patient    Methods Explanation;Demonstration;Tactile cues;Verbal cues    Comprehension Verbalized understanding;Returned demonstration  Objective      No blood pressure problems No latex allergies      TTP R [place] joint, sternal head on R slightly more palpable than L, TTP R calvicle and AC joint. Muscle tension R scalene area, upper trap muscle area.      Medbridge Access Code [email or web address]   Has osteopenia   Therapeutic exercise  Seated B scapular retraction targeting lower trap muscles 10x5 seconds for 3 sets   R shoulder flexion AAROM with PT with resisted extention to starting position with pressing tongue a roof or mouth to activate anterior cervical muscles 10x, then 8x, 10x  L cervical side bend to stretch R neck with PT stabilizing R scapula to target R scalene muscle 5x15 seconds for 3 sets   Seated gentle manually resisted L cervical side bend isometrics in neutral to promote proper posture 10x5 seconds, then 8x5 seconds    Seated chin tucks 5x5 seconds   Improved exercise technique, movement at target joints, use of target muscles after min to mod verbal, visual, tactile cues.         Response to treatment Fair tolerance to today's session.        Clinical impression Improved  ability to raise her R arm up into flexion with activation of anterior cervical and scapualr muscles (lower trap). Fair tolerance to today's session. Pt will benefit from continued skilled physical therapy services to decrease pain, improve R shoulder AROM, strength and function.     PT Short Term Goals - 06/25/21 1646       PT SHORT TERM GOAL #1   Title Pt will be independent with her initial HEP to decrease pain, improve strength, function, and ability to reach, push, and pull more comfortably.    Baseline Pt has started her HEP (05/22/2021); Pt performing her HEP. Has some questions but forgot what they are (06/25/2021)    Time 3    Period Weeks    Status On-going    Target Date 06/14/21               PT Long Term Goals - 06/25/21 1547       PT LONG TERM GOAL #1   Title Pt will have a decrease in R shoulder pain to 3/10 or less at worst to promote ability to reach, open and close the door, lift items more comfortably.    Baseline 10/10 R shoulder pain at worst for the past 2 months (05/22/2021); 6/10 at worst for the past 7 days (06/25/2021)    Time 8    Period Weeks    Status Partially Met    Target Date 07/19/21      PT LONG TERM GOAL #2   Title Pt will improve R shoulder flexion AROM to 150 degrees or more without pain to promote ability to reach.    Baseline 90 degrees R shoulder flexion AROM with pain (05/22/2021); 123 degrees flexion AROM, 147 degrees after resisted extension exercise (06/25/2021)    Time 8    Period Weeks    Status Partially Met    Target Date 07/19/21      PT LONG TERM GOAL #3   Title Pt will improve her R shoulder strength all planes by at least 1/2 MMT grade to promote ability to reach as well as lift items.    Baseline R shoulder flexion 4/5, abduction 4-/5, ER 4+/5, IR 4/5 (05/22/2021); R shoulder flexion 4+/5, abduction 4/5, ER 4+/5, IR 4+/5 (06/25/2021)  Time 8    Period Weeks    Status Partially Met    Target Date 07/19/21      PT  LONG TERM GOAL #4   Title Pt will improve R shoulder FOTO score by at least 10 points as a demonstration of improved function.    Baseline R shoulder FOTO 49 (05/22/2021); 59 (06/25/2021)    Time 8    Period Weeks    Status Achieved    Target Date 07/19/21                   Plan - 07/11/21 1008     Clinical Impression Statement Improved ability to raise her R arm up into flexion with activation of anterior cervical and scapualr muscles (lower trap). Fair tolerance to today's session. Pt will benefit from continued skilled physical therapy services to decrease pain, improve R shoulder AROM, strength and function.    Personal Factors and Comorbidities Age;Comorbidity 2    Comorbidities Arthritis, Mitral valve prolapse    Examination-Activity Limitations Lift;Reach Overhead;Carry;Bed Mobility    Stability/Clinical Decision Making Stable/Uncomplicated    Rehab Potential Fair    PT Frequency 2x / week    PT Duration 8 weeks    PT Treatment/Interventions Therapeutic activities;Therapeutic exercise;Manual techniques;Electrical Stimulation;Iontophoresis 66m/ml Dexamethasone;Functional mobility training;Neuromuscular re-education;Patient/family education;Dry needling    PT Next Visit Plan scapular strengthening, gentle strengthening, stability, manual techniques, modalities PRN    PT Home Exercise Plan Medbridge Access Code [email or web address]    Consulted and Agree with Plan of Care Patient             Patient will benefit from skilled therapeutic intervention in order to improve the following deficits and impairments:  Pain, Improper body mechanics, Postural dysfunction, Decreased strength  Visit Diagnosis: Chronic right shoulder pain  Muscle weakness (generalized)     Problem List Patient Active Problem List   Diagnosis Date Noted   Hematoma 02/11/2021   Ankle swelling 11/26/2020   Low back pain 11/22/2020   Hip pain, right 11/22/2020   Light headedness 06/24/2020   Hearing  loss 06/24/2020   Change in vision 06/24/2020   Stress 05/17/2020   Chest pain 05/16/2020   Welcome to Medicare preventive visit 06/20/2019   Viral syndrome 01/30/2019   Itching 07/21/2017   Asthma 07/16/2016   Urinary incontinence 07/16/2016   SOB (shortness of breath) 04/01/2016   Bilateral shoulder pain 02/24/2016   Fatigue 01/28/2016   Neck fullness 01/28/2016   Routine general medical examination at a health care facility 07/25/2015   Health care maintenance 10/09/2014   Neck pain 11/26/2013   Hypercholesterolemia 11/26/2013   History of colonic polyps 05/11/2013   Hyperbilirubinemia 01/18/2013   GERD (gastroesophageal reflux disease) 12/03/2012   Cholelithiasis 11/07/2012   IBS (irritable bowel syndrome) 11/07/2012   History of rheumatic fever 08/28/2012   MVP (mitral valve prolapse) 08/28/2012    MJoneen BoersPT, DPT   07/11/2021, 2:08 PM  [facility] [address] [place] [address]Phone: [phone]  Fax:  [phone] Name: Lynn TURCKMRN: [id number] of Birth: [birth year]

## 2021-07-15 ENCOUNTER — Encounter: Payer: Self-pay | Admitting: Internal Medicine

## 2021-07-15 DIAGNOSIS — M25511 Pain in right shoulder: Secondary | ICD-10-CM | POA: Insufficient documentation

## 2021-07-15 NOTE — Assessment & Plan Note (Signed)
Right shoulder and anterior chest pain as outlined.  Appears to be more msk.  Has been going to PT.  Will refer to ortho for further evaluation and to discuss other treatment recommendations.

## 2021-07-15 NOTE — Assessment & Plan Note (Signed)
Resolving.  Much improved.  She would like ortho opinion as well.  Follow.

## 2021-07-15 NOTE — Assessment & Plan Note (Signed)
Breathing stable.

## 2021-07-15 NOTE — Assessment & Plan Note (Signed)
The 10-year ASCVD risk score (Arnett DK, et al., 2019) is: 6.5%   Values used to calculate the score:     Age: 70 years     Sex: Female     Is Non-Hispanic African American: No     Diabetic: No     Tobacco smoker: No     Systolic Blood Pressure: 773 mmHg     Is BP treated: No     HDL Cholesterol: 39.9 mg/dL     Total Cholesterol: 200 mg/dL  Have discussed calculated cholesterol risk.  Have discussed recommendation for medication.  Has wanted to work on diet and exercise.  Follow lipid panel.

## 2021-07-16 ENCOUNTER — Ambulatory Visit: Payer: PPO

## 2021-07-16 DIAGNOSIS — M25511 Pain in right shoulder: Secondary | ICD-10-CM

## 2021-07-16 DIAGNOSIS — M6281 Muscle weakness (generalized): Secondary | ICD-10-CM

## 2021-07-16 NOTE — Therapy (Signed)
[facility] [facility] [address] [place], [address] Phone: [phone]   Fax:  [phone]  Physical Therapy Treatment  Patient Details  Name: Lynn Mann MRN: [id number] Date of Birth: 1950/11/01 Referring Provider (PT): Rosalia Hammers, DO   Encounter Date: 07/16/2021   PT End of Session - 07/16/21 1101     Visit Number 25    Number of Visits 17    Date for PT Re-Evaluation 07/19/21    Authorization Type 5    Authorization Time Period 10    PT Start Time 1102    PT Stop Time 2841    PT Time Calculation (min) 43 min    Activity Tolerance Patient tolerated treatment well    Behavior During Therapy [facility] for tasks assessed/performed             Past Medical History:  Diagnosis Date   Arthritis    C. difficile diarrhea    age 25s-40s    Chicken pox    Cholecystitis 11/2011   Did not require sgy - Dr. Staci Acosta - [facility]  (cholelithiasis)   H/O Clostridium difficile infection    IBS (irritable bowel syndrome)    MRSA exposure 2005   Spider bite   MVP (mitral valve prolapse)    Stable - Dr. Ubaldo Glassing   Rheumatic fever     Past Surgical History:  Procedure Laterality Date   CATARACT EXTRACTION  [id number]   MUSCLE BIOPSY      There were no vitals filed for this visit.   Subjective Assessment - 07/16/21 1104     Subjective Woke up in terrible pain in her R shoulder. Was so out of it that she went back to sleep. Happened again and woke her back up. Does not know what happened. She was in bed asleep.  Has not woken her up before. Might have popped something out. Dr. Nicki Reaper was supposed to schedule and appointment with Dr. Candelaria Stagers. 3-4/10 R clavicular pain.    Pertinent History R shoulder did not start really hurting until about a month after her fall. Just started with a little pain at the [facility] joint and clavicle. Sometimes she also feels pain along her R pectoralis muscle in the shape of a triangle. Pt fell  June 2022. Pt got up to go to the bathroom and tripped over a suitcase. Pt fell against the garment rack on her R side which kind of shielded her R side. Pt also got a hematoma the size of a baked potato at her L leg afterwards. Pt hurt R toes during the fall which got better after 3 days. Went to urgent care for her leg. Hematoma has improved but still has a tiny knot in her L leg. Pt is R hand dominant. Pt was in PT around May to July 2022 for for her R hip which is better.    Patient Stated Goals Not hurt.    Currently in Pain? Yes    Pain Score 4     Pain Onset More than a month ago                                        PT Education - 07/16/21 1113     Education Details ther-ex    Person(s) Educated Patient    Methods Explanation;Demonstration;Tactile cues;Verbal cues    Comprehension Returned demonstration;Verbalized  understanding             Objective      No blood pressure problems No latex allergies      TTP R [place] joint, sternal head on R slightly more palpable than L, TTP R calvicle and AC joint. Muscle tension R scalene area, upper trap muscle area.      Medbridge Access Code [email or web address]   Has osteopenia   Therapeutic exercise   Seated B scapular retraction targeting lower trap muscles 10x5 seconds for 3 sets   Feels better with scapular retraction reported  R shoulder flexion AAROM with PT with resisted extention to starting position with pressing tongue a roof or mouth to activate anterior cervical muscles 10x3     Seated gentle manually resisted L cervical side bend isometrics in neutral to promote proper posture 10x5 seconds for 3 sets   Sitting with cues for more neutral cervical posture.      Improved exercise technique, movement at target joints, use of target muscles after min to mod verbal, visual, tactile cues.        Manual therapy Seated STM R cervical paraspinal muscle and L > R upper trap muscles to decrease  tension     Response to treatment Decreased resting R calvicular pain to 1/10.        Clinical impression Improved ability to raise her R arm up into flexion with activation of anterior cervical and scapualr muscles (lower trap) and resisted shoulder extension. Decreased R lateral neck pain with decreasing R cervical convexity. Pt will benefit from continued skilled physical therapy services to decrease pain, improve R shoulder AROM, strength and function.        PT Short Term Goals - 06/25/21 1646       PT SHORT TERM GOAL #1   Title Pt will be independent with her initial HEP to decrease pain, improve strength, function, and ability to reach, push, and pull more comfortably.    Baseline Pt has started her HEP (05/22/2021); Pt performing her HEP. Has some questions but forgot what they are (06/25/2021)    Time 3    Period Weeks    Status On-going    Target Date 06/14/21               PT Long Term Goals - 06/25/21 1547       PT LONG TERM GOAL #1   Title Pt will have a decrease in R shoulder pain to 3/10 or less at worst to promote ability to reach, open and close the door, lift items more comfortably.    Baseline 10/10 R shoulder pain at worst for the past 2 months (05/22/2021); 6/10 at worst for the past 7 days (06/25/2021)    Time 8    Period Weeks    Status Partially Met    Target Date 07/19/21      PT LONG TERM GOAL #2   Title Pt will improve R shoulder flexion AROM to 150 degrees or more without pain to promote ability to reach.    Baseline 90 degrees R shoulder flexion AROM with pain (05/22/2021); 123 degrees flexion AROM, 147 degrees after resisted extension exercise (06/25/2021)    Time 8    Period Weeks    Status Partially Met    Target Date 07/19/21      PT LONG TERM GOAL #3   Title Pt will improve her R shoulder strength all planes by at least 1/2 MMT grade to promote ability  to reach as well as lift items.    Baseline R shoulder flexion 4/5, abduction  4-/5, ER 4+/5, IR 4/5 (05/22/2021); R shoulder flexion 4+/5, abduction 4/5, ER 4+/5, IR 4+/5 (06/25/2021)    Time 8    Period Weeks    Status Partially Met    Target Date 07/19/21      PT LONG TERM GOAL #4   Title Pt will improve R shoulder FOTO score by at least 10 points as a demonstration of improved function.    Baseline R shoulder FOTO 49 (05/22/2021); 59 (06/25/2021)    Time 8    Period Weeks    Status Achieved    Target Date 07/19/21                   Plan - 07/16/21 1116     Clinical Impression Statement Improved ability to raise her R arm up into flexion with activation of anterior cervical and scapualr muscles (lower trap) and resisted shoulder extension. Decreased R lateral neck pain with decreasing R cervical convexity. Pt will benefit from continued skilled physical therapy services to decrease pain, improve R shoulder AROM, strength and function.    Personal Factors and Comorbidities Age;Comorbidity 2    Comorbidities Arthritis, Mitral valve prolapse    Examination-Activity Limitations Lift;Reach Overhead;Carry;Bed Mobility    Stability/Clinical Decision Making Stable/Uncomplicated    Clinical Decision Making Low    Rehab Potential Fair    PT Frequency 2x / week    PT Duration 8 weeks    PT Treatment/Interventions Therapeutic activities;Therapeutic exercise;Manual techniques;Electrical Stimulation;Iontophoresis 23m/ml Dexamethasone;Functional mobility training;Neuromuscular re-education;Patient/family education;Dry needling    PT Next Visit Plan scapular strengthening, gentle strengthening, stability, manual techniques, modalities PRN    PT Home Exercise Plan Medbridge Access Code [email or web address]    Consulted and Agree with Plan of Care Patient             Patient will benefit from skilled therapeutic intervention in order to improve the following deficits and impairments:  Pain, Improper body mechanics, Postural dysfunction, Decreased strength  Visit  Diagnosis: Chronic right shoulder pain  Muscle weakness (generalized)     Problem List Patient Active Problem List   Diagnosis Date Noted   Right shoulder pain 07/15/2021   Hematoma 02/11/2021   Ankle swelling 11/26/2020   Low back pain 11/22/2020   Hip pain, right 11/22/2020   Light headedness 06/24/2020   Hearing loss 06/24/2020   Change in vision 06/24/2020   Stress 05/17/2020   Chest pain 05/16/2020   Welcome to Medicare preventive visit 06/20/2019   Viral syndrome 01/30/2019   Itching 07/21/2017   Asthma 07/16/2016   Urinary incontinence 07/16/2016   SOB (shortness of breath) 04/01/2016   Bilateral shoulder pain 02/24/2016   Fatigue 01/28/2016   Neck fullness 01/28/2016   Routine general medical examination at a health care facility 07/25/2015   Health care maintenance 10/09/2014   Neck pain 11/26/2013   Hypercholesterolemia 11/26/2013   History of colonic polyps 05/11/2013   Hyperbilirubinemia 01/18/2013   GERD (gastroesophageal reflux disease) 12/03/2012   Cholelithiasis 11/07/2012   IBS (irritable bowel syndrome) 11/07/2012   History of rheumatic fever 08/28/2012   MVP (mitral valve prolapse) 08/28/2012    MJoneen BoersPT, DPT   07/16/2021, 12:57 PM  [facility] [address] [place] [address]Phone: [phone]  Fax:  [phone] Name: Lynn FORNIMRN: [id number] of Birth: [birth year]

## 2021-07-18 ENCOUNTER — Ambulatory Visit: Payer: PPO

## 2021-07-18 DIAGNOSIS — M25511 Pain in right shoulder: Secondary | ICD-10-CM

## 2021-07-18 DIAGNOSIS — M6281 Muscle weakness (generalized): Secondary | ICD-10-CM

## 2021-07-18 NOTE — Therapy (Signed)
[facility] [facility] [address], [place], [address] Phone: [phone]   Fax:  [phone]  Physical Therapy Treatment  Patient Details  Name: Lynn Mann MRN: [id number] Date of Birth: 10/23/1950 Referring Provider (PT): Rosalia Hammers, DO   Encounter Date: 07/18/2021   PT End of Session - 07/18/21 1106     Visit Number 26    Number of Visits 17    Date for PT Re-Evaluation 07/19/21    Authorization Type 6    Authorization Time Period 10    PT Start Time 1106    PT Stop Time 1149    PT Time Calculation (min) 43 min    Activity Tolerance Patient tolerated treatment well    Behavior During Therapy [facility] for tasks assessed/performed             Past Medical History:  Diagnosis Date   Arthritis    C. difficile diarrhea    age 68s-40s    Chicken pox    Cholecystitis 11/2011   Did not require sgy - Dr. Staci Acosta - [facility]  (cholelithiasis)   H/O Clostridium difficile infection    IBS (irritable bowel syndrome)    MRSA exposure 2005   Spider bite   MVP (mitral valve prolapse)    Stable - Dr. Ubaldo Glassing   Rheumatic fever     Past Surgical History:  Procedure Laterality Date   CATARACT EXTRACTION  [id number]   MUSCLE BIOPSY      There were no vitals filed for this visit.   Subjective Assessment - 07/18/21 1107     Subjective Has had some painful times and ok times. Has been more uncomfortable, right now is ok. Has been practicing everything. Thinks that her bed might make it worse. Her mattress crispy like a hard candy coated shell with something underneath it. Mattress is not really soft. 0/10 at rest, slight discomfort with shoulder flexion. Shoulder does better when she is in the car. Has an appointment with Dr Candelaria Stagers tomorrow at 1:30 pm. Feels like PT is helping and would like to continue.    Pertinent History R shoulder did not start really hurting until about a month after her fall. Just started with  a little pain at the [facility] joint and clavicle. Sometimes she also feels pain along her R pectoralis muscle in the shape of a triangle. Pt fell June 2022. Pt got up to go to the bathroom and tripped over a suitcase. Pt fell against the garment rack on her R side which kind of shielded her R side. Pt also got a hematoma the size of a baked potato at her L leg afterwards. Pt hurt R toes during the fall which got better after 3 days. Went to urgent care for her leg. Hematoma has improved but still has a tiny knot in her L leg. Pt is R hand dominant. Pt was in PT around May to July 2022 for for her R hip which is better.    Patient Stated Goals Not hurt.    Currently in Pain? Other (Comment)   Slight discomfort. No pain level provided.   Pain Onset More than a month ago                                        PT Education - 07/18/21 1310     Education Details  ther-ex    Person(s) Educated Patient    Methods Explanation;Demonstration;Tactile cues;Verbal cues    Comprehension Returned demonstration;Verbalized understanding           Objective      No blood pressure problems No latex allergies      TTP R [place] joint, sternal head on R slightly more palpable than L, TTP R calvicle and AC joint. Muscle tension R scalene area, upper trap muscle area.      Medbridge Access Code [email or web address]   Has osteopenia   Therapeutic exercise   Standing R shoulder flexion and abduction AROM  Standing R shoulder adduction isometrics, shoulder in neutral, arm squeezing towel roll at side 10x5 seconds for 3 sets  Standing shoulder extension isometrics, pulling on a doorknob on closed door 10x5 seconds   Seated shoulder extension isometrics, hand at knee 10x5 seconds    R shoulder flexion AAROM with PT with resisted extention to starting position with pressing tongue a roof or mouth to activate anterior cervical muscles 10x3   135 degrees R shoulder flexion AROM afterwards  129  degrees R shoulder abduction AROM afterwards  Seated Pul[place]   Flexion 10x with gentle resistance from other UE into extension. Reviewed and given as part of her HEP. Pt demonstrated and verbalized understanding. Handout provided.      Improved exercise technique, movement at target joints, use of target muscles after min to mod verbal, visual, tactile cues.           Response to treatment Pt tolerated session well without aggravation of symptoms       Clinical impression Improved ability to perform R shoulder flexion with less discomfort after activation of posterior shoulder muscles with pt R shoulder flexion improving from 128 degrees at start of session to 135 degrees towards end of session. Pt R shoulder abduction AROM also improved. Pt states she feels like physical therapy is helping her. Pt pain levels however seem to have increased since last progress report on 06/25/2021 with reports of tearing sensation in her R chest area with burning. R chest sensation has improved based on subjective reports. Pt was still recommended to contact her doctor pertaining to symptoms. Pt verbalized understanding. Saw her PCP on 07/10/2021 and per pt, her PCP told her to continue PT. Pt to see Dr Candelaria Stagers tomorrow. Pt tolerated session well without aggravation of symptoms. Pt will benefit from continued skilled physical therapy services to decrease pain, improve R shoulder AROM, strength and function.    PT Short Term Goals - 07/18/21 1111       PT SHORT TERM GOAL #1   Title Pt will be independent with her initial HEP to decrease pain, improve strength, function, and ability to reach, push, and pull more comfortably.    Baseline Pt has started her HEP (05/22/2021); Pt performing her HEP. Has some questions but forgot what they are (06/25/2021), No questions with HEP, doing them (07/18/2021)    Time 3    Period Weeks    Status Achieved    Target Date 06/14/21               PT Long Term  Goals - 07/18/21 1112       PT LONG TERM GOAL #1   Title Pt will have a decrease in R shoulder pain to 3/10 or less at worst to promote ability to reach, open and close the door, lift items more comfortably.    Baseline 10/10 R shoulder pain at worst  for the past 2 months (05/22/2021); 6/10 at worst for the past 7 days (06/25/2021); 9-10/10 at worst for the past 7 days (07/18/2021)    Time 8    Period Weeks    Status On-going    Target Date 07/19/21      PT LONG TERM GOAL #2   Title Pt will improve R shoulder flexion AROM to 150 degrees or more without pain to promote ability to reach.    Baseline 90 degrees R shoulder flexion AROM with pain (05/22/2021); 123 degrees flexion AROM, 147 degrees after resisted extension exercise (06/25/2021); 128 degrees flexion,, 97 degrees abduction  (07/18/2021)    Time 8    Period Weeks    Status Partially Met    Target Date 07/19/21      PT LONG TERM GOAL #3   Title Pt will improve her R shoulder strength all planes by at least 1/2 MMT grade to promote ability to reach as well as lift items.    Baseline R shoulder flexion 4/5, abduction 4-/5, ER 4+/5, IR 4/5 (05/22/2021); R shoulder flexion 4+/5, abduction 4/5, ER 4+/5, IR 4+/5 (06/25/2021)    Time 8    Period Weeks    Status Partially Met    Target Date 07/19/21      PT LONG TERM GOAL #4   Title Pt will improve R shoulder FOTO score by at least 10 points as a demonstration of improved function.    Baseline R shoulder FOTO 49 (05/22/2021); 59 (06/25/2021)    Time 8    Period Weeks    Status Achieved    Target Date 07/19/21                   Plan - 07/18/21 1103     Clinical Impression Statement Improved ability to perform R shoulder flexion with less discomfort after activation of posterior shoulder muscles with pt R shoulder flexion improving from 128 degrees at start of session to 135 degrees towards end of session. Pt R shoulder abduction AROM also improved. Pt states she feels  like physical therapy is helping her. Pt pain levels however seem to have increased since last progress report on 06/25/2021 with reports of tearing sensation in her R chest area with burning. R chest sensation has improved based on subjective reports. Pt was still recommended to contact her doctor pertaining to symptoms. Pt verbalized understanding. Saw her PCP on 07/10/2021 and per pt, her PCP told her to continue PT. Pt to see Dr Candelaria Stagers tomorrow. Pt tolerated session well without aggravation of symptoms. Pt will benefit from continued skilled physical therapy services to decrease pain, improve R shoulder AROM, strength and function.    Personal Factors and Comorbidities Age;Comorbidity 2    Comorbidities Arthritis, Mitral valve prolapse    Examination-Activity Limitations Lift;Reach Overhead;Carry;Bed Mobility    Stability/Clinical Decision Making Stable/Uncomplicated    Rehab Potential Fair    PT Frequency 2x / week    PT Duration 8 weeks    PT Treatment/Interventions Therapeutic activities;Therapeutic exercise;Manual techniques;Electrical Stimulation;Iontophoresis 54m/ml Dexamethasone;Functional mobility training;Neuromuscular re-education;Patient/family education;Dry needling    PT Next Visit Plan scapular strengthening, gentle strengthening, stability, manual techniques, modalities PRN    PT Home Exercise Plan Medbridge Access Code [email or web address]    Consulted and Agree with Plan of Care Patient             Patient will benefit from skilled therapeutic intervention in order to improve the following deficits and impairments:  Pain, Improper body  mechanics, Postural dysfunction, Decreased strength  Visit Diagnosis: Chronic right shoulder pain  Muscle weakness (generalized)     Problem List Patient Active Problem List   Diagnosis Date Noted   Right shoulder pain 07/15/2021   Hematoma 02/11/2021   Ankle swelling 11/26/2020   Low back pain 11/22/2020   Hip pain, right 11/22/2020    Light headedness 06/24/2020   Hearing loss 06/24/2020   Change in vision 06/24/2020   Stress 05/17/2020   Chest pain 05/16/2020   Welcome to Medicare preventive visit 06/20/2019   Viral syndrome 01/30/2019   Itching 07/21/2017   Asthma 07/16/2016   Urinary incontinence 07/16/2016   SOB (shortness of breath) 04/01/2016   Bilateral shoulder pain 02/24/2016   Fatigue 01/28/2016   Neck fullness 01/28/2016   Routine general medical examination at a health care facility 07/25/2015   Health care maintenance 10/09/2014   Neck pain 11/26/2013   Hypercholesterolemia 11/26/2013   History of colonic polyps 05/11/2013   Hyperbilirubinemia 01/18/2013   GERD (gastroesophageal reflux disease) 12/03/2012   Cholelithiasis 11/07/2012   IBS (irritable bowel syndrome) 11/07/2012   History of rheumatic fever 08/28/2012   MVP (mitral valve prolapse) 08/28/2012   Joneen Boers PT, DPT   07/18/2021, 5:03 PM  [place] [facility] [address], [place], [address] Phone: [phone]   Fax:  [phone]  Name: Lynn Mann MRN: [id number] Date of Birth: 1951-03-28

## 2021-07-19 DIAGNOSIS — W010XXD Fall on same level from slipping, tripping and stumbling without subsequent striking against object, subsequent encounter: Secondary | ICD-10-CM | POA: Diagnosis not present

## 2021-07-19 DIAGNOSIS — M25511 Pain in right shoulder: Secondary | ICD-10-CM | POA: Diagnosis not present

## 2021-07-19 DIAGNOSIS — M62838 Other muscle spasm: Secondary | ICD-10-CM | POA: Diagnosis not present

## 2021-07-19 DIAGNOSIS — S43211D Anterior subluxation of right sternoclavicular joint, subsequent encounter: Secondary | ICD-10-CM | POA: Diagnosis not present

## 2021-07-19 DIAGNOSIS — M19011 Primary osteoarthritis, right shoulder: Secondary | ICD-10-CM | POA: Diagnosis not present

## 2021-07-24 ENCOUNTER — Ambulatory Visit: Payer: PPO

## 2021-07-24 DIAGNOSIS — G8929 Other chronic pain: Secondary | ICD-10-CM

## 2021-07-24 DIAGNOSIS — M25511 Pain in right shoulder: Secondary | ICD-10-CM | POA: Diagnosis not present

## 2021-07-24 DIAGNOSIS — M6281 Muscle weakness (generalized): Secondary | ICD-10-CM

## 2021-07-24 NOTE — Therapy (Signed)
[facility] [address], [place], [address] Phone: [phone]   Fax:  [phone]  Physical Therapy Treatment  Patient Details  Name: Lynn Mann MRN: [id number] Date of Birth: 1951-03-26 Referring Provider (PT): Rosalia Hammers, DO   Encounter Date: 07/24/2021   PT End of Session - 07/24/21 1107     Visit Number 27    Number of Visits 29    Date for PT Re-Evaluation 09/06/21    Authorization Type 7    Authorization Time Period 10    PT Start Time 1107    PT Stop Time 1142    PT Time Calculation (min) 35 min    Activity Tolerance Patient tolerated treatment well    Behavior During Therapy [facility] for tasks assessed/performed             Past Medical History:  Diagnosis Date   Arthritis    C. difficile diarrhea    age 42s-40s    Chicken pox    Cholecystitis 11/2011   Did not require sgy - Dr. Staci Acosta - [facility]  (cholelithiasis)   H/O Clostridium difficile infection    IBS (irritable bowel syndrome)    MRSA exposure 2005   Spider bite   MVP (mitral valve prolapse)    Stable - Dr. Ubaldo Glassing   Rheumatic fever     Past Surgical History:  Procedure Laterality Date   CATARACT EXTRACTION  [id number]   MUSCLE BIOPSY      There were no vitals filed for this visit.   Subjective Assessment - 07/24/21 1108     Subjective R shoulder is about a 2/10 currently. Had a couple times R shoulder was a 10/10 was told to continue PT if she feels like it is helping. Feels like PT is helping.    Pertinent History R shoulder did not start really hurting until about a month after her fall. Just started with a little pain at the [facility] joint and clavicle. Sometimes she also feels pain along her R pectoralis muscle in the shape of a triangle. Pt fell June 2022. Pt got up to go to the bathroom and tripped over a suitcase. Pt fell against the garment rack on her R side which kind of shielded her R side. Pt also got a hematoma  the size of a baked potato at her L leg afterwards. Pt hurt R toes during the fall which got better after 3 days. Went to urgent care for her leg. Hematoma has improved but still has a tiny knot in her L leg. Pt is R hand dominant. Pt was in PT around May to July 2022 for for her R hip which is better.    Patient Stated Goals Not hurt.    Currently in Pain? Yes    Pain Score 2     Pain Onset More than a month ago                                        PT Education - 07/24/21 1118     Education Details ther-ex    [employer]) Educated Patient    Methods Explanation;Demonstration;Tactile cues;Verbal cues    Comprehension Returned demonstration;Verbalized understanding            Objective      No blood pressure problems No latex allergies      TTP  R [place] joint, sternal head on R slightly more palpable than L, TTP R calvicle and AC joint. Muscle tension R scalene area, upper trap muscle area.      Medbridge Access Code [email or web address]   Has osteopenia   Therapeutic exercise   Time spent answering pt questions   Seated trunk rotation 10x5 seconds R and L for 2 sets  Reviewed POC: 2x/week for 6 weeks  Seated manually resisted scapular retraction targeting the lower trap 10x3 with 5 second holds  Seated manually resisted triceps extension isometrics, elbow flexed to 90 degrees 10x3 with 5 second holds   R shoulder flexion AAROM with PT with resisted extention to starting position with pressing tongue a roof or mouth to activate anterior cervical muscles 7x. Difficulty today  36 degrees flexion AROM.   Improved exercise technique, movement at target joints, use of target muscles after min to mod verbal, visual, tactile cues.           Response to treatment Pt tolerated session well without aggravation of symptoms       Clinical impression Pt demonstrates inconsistent progress with R shoulder AROM as well as pain with days pt able to raise her  arm up with less discomfort and others where pt has a difficult time doing so. Pt demonstrates difficulty with R shoulder AROM today compared to previous week. Overall, pt states physical therapy is helping her and would like to continue working on improving ROM, and decreasing pain. Pt will benefit from continued skilled physical therapy services to address the aforementioned deficits and improve function.      PT Short Term Goals - 07/18/21 1111       PT SHORT TERM GOAL #1   Title Pt will be independent with her initial HEP to decrease pain, improve strength, function, and ability to reach, push, and pull more comfortably.    Baseline Pt has started her HEP (05/22/2021); Pt performing her HEP. Has some questions but forgot what they are (06/25/2021), No questions with HEP, doing them (07/18/2021)    Time 3    Period Weeks    Status Achieved    Target Date 06/14/21               PT Long Term Goals - 07/24/21 1123       PT LONG TERM GOAL #1   Title Pt will have a decrease in R shoulder pain to 3/10 or less at worst to promote ability to reach, open and close the door, lift items more comfortably.    Baseline 10/10 R shoulder pain at worst for the past 2 months (05/22/2021); 6/10 at worst for the past 7 days (06/25/2021); 9-10/10 at worst for the past 7 days (07/18/2021); (07/24/2021)    Time 6    Period Weeks    Status On-going    Target Date 09/06/21      PT LONG TERM GOAL #2   Title Pt will improve R shoulder flexion AROM to 150 degrees or more without pain to promote ability to reach.    Baseline 90 degrees R shoulder flexion AROM with pain (05/22/2021); 123 degrees flexion AROM, 147 degrees after resisted extension exercise (06/25/2021); 128 degrees flexion,, 97 degrees abduction  (07/18/2021); 36 degrees flexion (07/24/2021)    Time 6    Period Weeks    Status On-going    Target Date 09/06/21      PT LONG TERM GOAL #3   Title Pt will improve her R shoulder strength  all  planes by at least 1/2 MMT grade to promote ability to reach as well as lift items.    Baseline R shoulder flexion 4/5, abduction 4-/5, ER 4+/5, IR 4/5 (05/22/2021); R shoulder flexion 4+/5, abduction 4/5, ER 4+/5, IR 4+/5 (06/25/2021)    Time 6    Period Weeks    Status On-going    Target Date 09/06/21      PT LONG TERM GOAL #4   Title Pt will improve R shoulder FOTO score by at least 10 points as a demonstration of improved function.    Baseline R shoulder FOTO 49 (05/22/2021); 59 (06/25/2021)    Time 8    Period Weeks    Status Achieved    Target Date 07/19/21                   Plan - 07/24/21 1118     Clinical Impression Statement Pt demonstrates inconsistent progress with R shoulder AROM as well as pain with days pt able to raise her arm up with less discomfort and others where pt has a difficult time doing so. Pt demonstrates difficulty with R shoulder AROM today compared to previous week. Overall, pt states physical therapy is helping her and would like to continue working on improving ROM, and decreasing pain. Pt will benefit from continued skilled physical therapy services to address the aforementioned deficits and improve function.    Personal Factors and Comorbidities Age;Comorbidity 2    Comorbidities Arthritis, Mitral valve prolapse    Examination-Activity Limitations Lift;Reach Overhead;Carry;Bed Mobility    Stability/Clinical Decision Making Evolving/Moderate complexity   inconsistent progress   Clinical Decision Making Moderate    Rehab Potential Fair    PT Frequency 2x / week    PT Duration 6 weeks    PT Treatment/Interventions Therapeutic activities;Therapeutic exercise;Manual techniques;Electrical Stimulation;Iontophoresis 4mg /ml Dexamethasone;Functional mobility training;Neuromuscular re-education;Patient/family education;Dry needling    PT Next Visit Plan scapular strengthening, gentle strengthening, stability, manual techniques, modalities PRN    PT Home  Exercise Plan Medbridge Access Code [email or web address]    Consulted and Agree with Plan of Care Patient             Patient will benefit from skilled therapeutic intervention in order to improve the following deficits and impairments:  Pain, Improper body mechanics, Postural dysfunction, Decreased strength  Visit Diagnosis: Chronic right shoulder pain - Plan: PT plan of care cert/re-cert  Muscle weakness (generalized) - Plan: PT plan of care cert/re-cert     Problem List Patient Active Problem List   Diagnosis Date Noted   Right shoulder pain 07/15/2021   Hematoma 02/11/2021   Ankle swelling 11/26/2020   Low back pain 11/22/2020   Hip pain, right 11/22/2020   Light headedness 06/24/2020   Hearing loss 06/24/2020   Change in vision 06/24/2020   Stress 05/17/2020   Chest pain 05/16/2020   Welcome to Medicare preventive visit 06/20/2019   Viral syndrome 01/30/2019   Itching 07/21/2017   Asthma 07/16/2016   Urinary incontinence 07/16/2016   SOB (shortness of breath) 04/01/2016   Bilateral shoulder pain 02/24/2016   Fatigue 01/28/2016   Neck fullness 01/28/2016   Routine general medical examination at a health care facility 07/25/2015   Health care maintenance 10/09/2014   Neck pain 11/26/2013   Hypercholesterolemia 11/26/2013   History of colonic polyps 05/11/2013   Hyperbilirubinemia 01/18/2013   GERD (gastroesophageal reflux disease) 12/03/2012   Cholelithiasis 11/07/2012   IBS (irritable bowel syndrome) 11/07/2012   History of rheumatic fever  08/28/2012   MVP (mitral valve prolapse) 08/28/2012     Joneen Boers PT, DPT   07/24/2021, 1:27 PM  [facility] [address] [place], [address] Phone: [phone]   Fax:  [phone]  Name: CHYENNE SOBCZAK MRN: [id number] Date of Birth: 06-11-1951

## 2021-07-26 ENCOUNTER — Ambulatory Visit: Payer: PPO

## 2021-07-26 DIAGNOSIS — M6281 Muscle weakness (generalized): Secondary | ICD-10-CM

## 2021-07-26 DIAGNOSIS — M25511 Pain in right shoulder: Secondary | ICD-10-CM

## 2021-07-26 NOTE — Therapy (Signed)
[facility] [address], [place], [address] Phone: [phone]   Fax:  [phone]  Physical Therapy Treatment  Patient Details  Name: Lynn Mann MRN: [id number] Date of Birth: Mar 22, 1951 Referring Provider (PT): Rosalia Hammers, DO   Encounter Date: 07/26/2021   PT End of Session - 07/26/21 1109     Visit Number 18    Number of Visits 29    Date for PT Re-Evaluation 09/06/21    Authorization Type 8    Authorization Time Period 10    PT Start Time 1109    PT Stop Time 1147    PT Time Calculation (min) 38 min    Activity Tolerance Patient tolerated treatment well    Behavior During Therapy [facility] for tasks assessed/performed             Past Medical History:  Diagnosis Date   Arthritis    C. difficile diarrhea    age 62s-40s    Chicken pox    Cholecystitis 11/2011   Did not require sgy - Dr. Staci Acosta - [facility]  (cholelithiasis)   H/O Clostridium difficile infection    IBS (irritable bowel syndrome)    MRSA exposure 2005   Spider bite   MVP (mitral valve prolapse)    Stable - Dr. Ubaldo Glassing   Rheumatic fever     Past Surgical History:  Procedure Laterality Date   CATARACT EXTRACTION  [id number]   MUSCLE BIOPSY      There were no vitals filed for this visit.   Subjective Assessment - 07/26/21 1110     Subjective R shoulder is ok. Got uncomfortable with exercises.    Pertinent History R shoulder did not start really hurting until about a month after her fall. Just started with a little pain at the [facility] joint and clavicle. Sometimes she also feels pain along her R pectoralis muscle in the shape of a triangle. Pt fell June 2022. Pt got up to go to the bathroom and tripped over a suitcase. Pt fell against the garment rack on her R side which kind of shielded her R side. Pt also got a hematoma the size of a baked potato at her L leg afterwards. Pt hurt R toes during the fall which got better after 3  days. Went to urgent care for her leg. Hematoma has improved but still has a tiny knot in her L leg. Pt is R hand dominant. Pt was in PT around May to July 2022 for for her R hip which is better.    Patient Stated Goals Not hurt.    Currently in Pain? No/denies    Pain Onset More than a month ago                                        PT Education - 07/26/21 1824     Education Details ther-ex    Person(s) Educated Patient    Methods Explanation;Demonstration;Tactile cues;Verbal cues    Comprehension Returned demonstration;Verbalized understanding             Objective      No blood pressure problems No latex allergies      TTP R [place] joint, sternal head on R slightly more palpable than L, TTP R calvicle and AC joint. Muscle tension R scalene area, upper trap muscle area.  Medbridge Access Code [email or web address]   Has osteopenia    Manual therapy  Seated STM R cervical paraspinal muscles and R upper trap to decrease tension.   Therapeutic exercise   R shoulder AROM flexion 136 degrees   Seated manually resisted scapular retraction targeting the lower trap 10x3 with 5 second holds  Seated R shoulder horizontal abduction with PT assist 10x5 seconds   Seated manually resisted gentle shoulder adduction isometrics in slight flexion 2x5 seconds. Discomfort   R shoulder flexion AAROM with PT with resisted extention to starting position with pressing tongue a roof or mouth to activate anterior cervical muscles 10x.  Seated manually resisted R shoulder ER isometrics   With R shoulder in about 25 degrees flexion, elbow resting on arm rest, flexed to 90 degrees. 10x5 seconds   Better placement of proximal clavicle palpated.   R shoulder flexion AROM after session 140 degrees      Improved exercise technique, movement at target joints, use of target muscles after min to mod verbal, visual, tactile cues.           Response to treatment Pt  tolerated session well without aggravation of symptoms       Clinical impression Improved R shoulder flexion AROM to 140 degrees today after infraspinatus muscle strengthening. Pt tolerated session well without aggravation of symptoms. Pt will benefit from continued skilled physical therapy services to address the aforementioned deficits and improve function.      PT Short Term Goals - 07/18/21 1111       PT SHORT TERM GOAL #1   Title Pt will be independent with her initial HEP to decrease pain, improve strength, function, and ability to reach, push, and pull more comfortably.    Baseline Pt has started her HEP (05/22/2021); Pt performing her HEP. Has some questions but forgot what they are (06/25/2021), No questions with HEP, doing them (07/18/2021)    Time 3    Period Weeks    Status Achieved    Target Date 06/14/21               PT Long Term Goals - 07/24/21 1123       PT LONG TERM GOAL #1   Title Pt will have a decrease in R shoulder pain to 3/10 or less at worst to promote ability to reach, open and close the door, lift items more comfortably.    Baseline 10/10 R shoulder pain at worst for the past 2 months (05/22/2021); 6/10 at worst for the past 7 days (06/25/2021); 9-10/10 at worst for the past 7 days (07/18/2021); (07/24/2021)    Time 6    Period Weeks    Status On-going    Target Date 09/06/21      PT LONG TERM GOAL #2   Title Pt will improve R shoulder flexion AROM to 150 degrees or more without pain to promote ability to reach.    Baseline 90 degrees R shoulder flexion AROM with pain (05/22/2021); 123 degrees flexion AROM, 147 degrees after resisted extension exercise (06/25/2021); 128 degrees flexion,, 97 degrees abduction  (07/18/2021); 36 degrees flexion (07/24/2021)    Time 6    Period Weeks    Status On-going    Target Date 09/06/21      PT LONG TERM GOAL #3   Title Pt will improve her R shoulder strength all planes by at least 1/2 MMT grade to promote  ability to reach as well as lift items.    Baseline R  shoulder flexion 4/5, abduction 4-/5, ER 4+/5, IR 4/5 (05/22/2021); R shoulder flexion 4+/5, abduction 4/5, ER 4+/5, IR 4+/5 (06/25/2021)    Time 6    Period Weeks    Status On-going    Target Date 09/06/21      PT LONG TERM GOAL #4   Title Pt will improve R shoulder FOTO score by at least 10 points as a demonstration of improved function.    Baseline R shoulder FOTO 49 (05/22/2021); 59 (06/25/2021)    Time 8    Period Weeks    Status Achieved    Target Date 07/19/21                   Plan - 07/26/21 1826     Clinical Impression Statement Improved R shoulder flexion AROM to 140 degrees today after infraspinatus muscle strengthening. Pt tolerated session well without aggravation of symptoms. Pt will benefit from continued skilled physical therapy services to address the aforementioned deficits and improve function.    Personal Factors and Comorbidities Age;Comorbidity 2    Comorbidities Arthritis, Mitral valve prolapse    Examination-Activity Limitations Lift;Reach Overhead;Carry;Bed Mobility    Stability/Clinical Decision Making Stable/Uncomplicated   inconsistent progress   Clinical Decision Making Low    Rehab Potential Fair    PT Frequency 2x / week    PT Duration 6 weeks    PT Treatment/Interventions Therapeutic activities;Therapeutic exercise;Manual techniques;Electrical Stimulation;Iontophoresis 4mg /ml Dexamethasone;Functional mobility training;Neuromuscular re-education;Patient/family education;Dry needling    PT Next Visit Plan scapular strengthening, gentle strengthening, stability, manual techniques, modalities PRN    PT Home Exercise Plan Medbridge Access Code [email or web address]    Consulted and Agree with Plan of Care Patient             Patient will benefit from skilled therapeutic intervention in order to improve the following deficits and impairments:  Pain, Improper body mechanics, Postural dysfunction,  Decreased strength  Visit Diagnosis: Chronic right shoulder pain  Muscle weakness (generalized)     Problem List Patient Active Problem List   Diagnosis Date Noted   Right shoulder pain 07/15/2021   Hematoma 02/11/2021   Ankle swelling 11/26/2020   Low back pain 11/22/2020   Hip pain, right 11/22/2020   Light headedness 06/24/2020   Hearing loss 06/24/2020   Change in vision 06/24/2020   Stress 05/17/2020   Chest pain 05/16/2020   Welcome to Medicare preventive visit 06/20/2019   Viral syndrome 01/30/2019   Itching 07/21/2017   Asthma 07/16/2016   Urinary incontinence 07/16/2016   SOB (shortness of breath) 04/01/2016   Bilateral shoulder pain 02/24/2016   Fatigue 01/28/2016   Neck fullness 01/28/2016   Routine general medical examination at a health care facility 07/25/2015   Health care maintenance 10/09/2014   Neck pain 11/26/2013   Hypercholesterolemia 11/26/2013   History of colonic polyps 05/11/2013   Hyperbilirubinemia 01/18/2013   GERD (gastroesophageal reflux disease) 12/03/2012   Cholelithiasis 11/07/2012   IBS (irritable bowel syndrome) 11/07/2012   History of rheumatic fever 08/28/2012   MVP (mitral valve prolapse) 08/28/2012    Ahilyn Nell, PT 07/26/2021, 6:37 PM  [facility] [address] [place], [address] Phone: [phone]   Fax:  [phone]  Name: TYEISHA DINAN MRN: [id number] Date of Birth: Apr 29, 1951

## 2021-08-01 ENCOUNTER — Ambulatory Visit: Payer: PPO

## 2021-08-08 ENCOUNTER — Ambulatory Visit: Payer: PPO | Attending: Sports Medicine

## 2021-08-08 DIAGNOSIS — G8929 Other chronic pain: Secondary | ICD-10-CM | POA: Insufficient documentation

## 2021-08-08 DIAGNOSIS — M25511 Pain in right shoulder: Secondary | ICD-10-CM | POA: Insufficient documentation

## 2021-08-08 DIAGNOSIS — M6281 Muscle weakness (generalized): Secondary | ICD-10-CM | POA: Insufficient documentation

## 2021-08-13 ENCOUNTER — Ambulatory Visit: Payer: PPO

## 2021-08-15 ENCOUNTER — Ambulatory Visit: Payer: PPO

## 2021-08-15 DIAGNOSIS — M25511 Pain in right shoulder: Secondary | ICD-10-CM

## 2021-08-15 DIAGNOSIS — G8929 Other chronic pain: Secondary | ICD-10-CM

## 2021-08-15 DIAGNOSIS — M6281 Muscle weakness (generalized): Secondary | ICD-10-CM | POA: Diagnosis not present

## 2021-08-15 NOTE — Therapy (Signed)
[facility] [address], [place], [address] Phone: [phone]   Fax:  [phone]  Physical Therapy Treatment  Patient Details  Name: Lynn Mann MRN: [id number] Date of Birth: 15-Aug-1950 Referring Provider (PT): Rosalia Hammers, DO   Encounter Date: 08/15/2021   PT End of Session - 08/15/21 1428     Visit Number 19    Number of Visits 29    Date for PT Re-Evaluation 09/06/21    Authorization Type 9    Authorization Time Period 10    PT Start Time 1428    PT Stop Time 1514    PT Time Calculation (min) 46 min    Activity Tolerance Patient tolerated treatment well    Behavior During Therapy [facility] for tasks assessed/performed             Past Medical History:  Diagnosis Date   Arthritis    C. difficile diarrhea    age 81s-40s    Chicken pox    Cholecystitis 11/2011   Did not require sgy - Dr. Staci Acosta - [facility]  (cholelithiasis)   H/O Clostridium difficile infection    IBS (irritable bowel syndrome)    MRSA exposure 2005   Spider bite   MVP (mitral valve prolapse)    Stable - Dr. Ubaldo Glassing   Rheumatic fever     Past Surgical History:  Procedure Laterality Date   CATARACT EXTRACTION  [id number]   MUSCLE BIOPSY      There were no vitals filed for this visit.   Subjective Assessment - 08/15/21 1429     Subjective R shoulder is doing pretty well. Hurt a little worse last night but its doing pretty well. Sleeping a little better, sometimes waking up with a little pain. Has not been really good doing her exercises. Has also been having car problems. 1/10 currently.    Pertinent History R shoulder did not start really hurting until about a month after her fall. Just started with a little pain at the [facility] joint and clavicle. Sometimes she also feels pain along her R pectoralis muscle in the shape of a triangle. Pt fell June 2022. Pt got up to go to the bathroom and tripped over a suitcase. Pt fell  against the garment rack on her R side which kind of shielded her R side. Pt also got a hematoma the size of a baked potato at her L leg afterwards. Pt hurt R toes during the fall which got better after 3 days. Went to urgent care for her leg. Hematoma has improved but still has a tiny knot in her L leg. Pt is R hand dominant. Pt was in PT around May to July 2022 for for her R hip which is better.    Patient Stated Goals Not hurt.    Currently in Pain? Yes    Pain Score 1     Pain Onset More than a month ago                                        PT Education - 08/15/21 1454     Education Details ther-ex    Person(s) Educated Patient    Methods Explanation;Demonstration;Tactile cues;Verbal cues    Comprehension Returned demonstration;Verbalized understanding            Objective      No blood  pressure problems No latex allergies      TTP R [place] joint, sternal head on R slightly more palpable than L, TTP R calvicle and AC joint. Muscle tension R scalene area, upper trap muscle area.      Medbridge Access Code [email or web address]   Has osteopenia     Manual therapy Seated STM R cervical paraspinal muscles and R upper trap to decrease tension.    Therapeutic exercise   Seated manually resisted R shoulder ER isometrics              With R shoulder in about 25 degrees flexion, elbow resting on arm rest, flexed to 90 degrees. 10x5 seconds for 2 sets, then 6x5 seconds             Better placement of proximal clavicle palpated.   Seated manually resisted scapular retraction targeting the lower trap 10x3 with 5 second holds    Seated manually resisted R shoulder extension in about 25 degrees flexion resting on arm rest. 10x3 with 5 second holds  Seated R elbow extension isometrics with R shoulder in about 25 degrees flexion resting on arm rest with elbow flexed to 90 degrees 10x3 with 5 second holds    R shoulder flexion AAROM with PT with resisted  extention to starting position with pressing tongue a roof or mouth to activate anterior cervical muscles 10x2.  147 degrees flexion after session     Improved exercise technique, movement at target joints, use of target muscles after min to mod verbal, visual, tactile cues.           Response to treatment Fair tolerance today's session.        Clinical impression Improving overall ability to raise R arm up with treatment to promote infraspinatus, posterior deltoid muscle and lower trap muscle activation. Pt able to achieve 147 degrees R shoulder flexion. Fair tolerance to today's session. Pt will benefit from continued skilled physical therapy services to address the aforementioned deficits and improve function.       PT Short Term Goals - 07/18/21 1111       PT SHORT TERM GOAL #1   Title Pt will be independent with her initial HEP to decrease pain, improve strength, function, and ability to reach, push, and pull more comfortably.    Baseline Pt has started her HEP (05/22/2021); Pt performing her HEP. Has some questions but forgot what they are (06/25/2021), No questions with HEP, doing them (07/18/2021)    Time 3    Period Weeks    Status Achieved    Target Date 06/14/21               PT Long Term Goals - 07/24/21 1123       PT LONG TERM GOAL #1   Title Pt will have a decrease in R shoulder pain to 3/10 or less at worst to promote ability to reach, open and close the door, lift items more comfortably.    Baseline 10/10 R shoulder pain at worst for the past 2 months (05/22/2021); 6/10 at worst for the past 7 days (06/25/2021); 9-10/10 at worst for the past 7 days (07/18/2021); (07/24/2021)    Time 6    Period Weeks    Status On-going    Target Date 09/06/21      PT LONG TERM GOAL #2   Title Pt will improve R shoulder flexion AROM to 150 degrees or more without pain to promote ability to reach.  Baseline 90 degrees R shoulder flexion AROM with pain (05/22/2021);  123 degrees flexion AROM, 147 degrees after resisted extension exercise (06/25/2021); 128 degrees flexion,, 97 degrees abduction  (07/18/2021); 36 degrees flexion (07/24/2021)    Time 6    Period Weeks    Status On-going    Target Date 09/06/21      PT LONG TERM GOAL #3   Title Pt will improve her R shoulder strength all planes by at least 1/2 MMT grade to promote ability to reach as well as lift items.    Baseline R shoulder flexion 4/5, abduction 4-/5, ER 4+/5, IR 4/5 (05/22/2021); R shoulder flexion 4+/5, abduction 4/5, ER 4+/5, IR 4+/5 (06/25/2021)    Time 6    Period Weeks    Status On-going    Target Date 09/06/21      PT LONG TERM GOAL #4   Title Pt will improve R shoulder FOTO score by at least 10 points as a demonstration of improved function.    Baseline R shoulder FOTO 49 (05/22/2021); 59 (06/25/2021)    Time 8    Period Weeks    Status Achieved    Target Date 07/19/21                   Plan - 08/15/21 1454     Clinical Impression Statement Improving overall ability to raise R arm up with treatment to promote infraspinatus, posterior deltoid muscle and lower trap muscle activation. Pt able to achieve 147 degrees R shoulder flexion. Fair tolerance to today's session. Pt will benefit from continued skilled physical therapy services to address the aforementioned deficits and improve function.    Personal Factors and Comorbidities Age;Comorbidity 2    Comorbidities Arthritis, Mitral valve prolapse    Examination-Activity Limitations Lift;Reach Overhead;Carry;Bed Mobility    Stability/Clinical Decision Making Stable/Uncomplicated   inconsistent progress   Rehab Potential Fair    PT Frequency 2x / week    PT Duration 6 weeks    PT Treatment/Interventions Therapeutic activities;Therapeutic exercise;Manual techniques;Electrical Stimulation;Iontophoresis 4mg /ml Dexamethasone;Functional mobility training;Neuromuscular re-education;Patient/family education;Dry needling     PT Next Visit Plan scapular strengthening, gentle strengthening, stability, manual techniques, modalities PRN    PT Home Exercise Plan Medbridge Access Code [email or web address]    Consulted and Agree with Plan of Care Patient             Patient will benefit from skilled therapeutic intervention in order to improve the following deficits and impairments:  Pain, Improper body mechanics, Postural dysfunction, Decreased strength  Visit Diagnosis: Chronic right shoulder pain  Muscle weakness (generalized)     Problem List Patient Active Problem List   Diagnosis Date Noted   Right shoulder pain 07/15/2021   Hematoma 02/11/2021   Ankle swelling 11/26/2020   Low back pain 11/22/2020   Hip pain, right 11/22/2020   Light headedness 06/24/2020   Hearing loss 06/24/2020   Change in vision 06/24/2020   Stress 05/17/2020   Chest pain 05/16/2020   Welcome to Medicare preventive visit 06/20/2019   Viral syndrome 01/30/2019   Itching 07/21/2017   Asthma 07/16/2016   Urinary incontinence 07/16/2016   SOB (shortness of breath) 04/01/2016   Bilateral shoulder pain 02/24/2016   Fatigue 01/28/2016   Neck fullness 01/28/2016   Routine general medical examination at a health care facility 07/25/2015   Health care maintenance 10/09/2014   Neck pain 11/26/2013   Hypercholesterolemia 11/26/2013   History of colonic polyps 05/11/2013   Hyperbilirubinemia 01/18/2013   GERD (  gastroesophageal reflux disease) 12/03/2012   Cholelithiasis 11/07/2012   IBS (irritable bowel syndrome) 11/07/2012   History of rheumatic fever 08/28/2012   MVP (mitral valve prolapse) 08/28/2012    Joneen Boers PT, DPT   08/15/2021, 3:24 PM  [facility] [address] [place], [address] Phone: [phone]   Fax:  [phone]  Name: Lynn Mann MRN: [id number] Date of Birth: 24-Mar-1951

## 2021-08-20 ENCOUNTER — Ambulatory Visit: Payer: PPO

## 2021-08-20 ENCOUNTER — Other Ambulatory Visit: Payer: Self-pay

## 2021-08-20 DIAGNOSIS — G8929 Other chronic pain: Secondary | ICD-10-CM

## 2021-08-20 DIAGNOSIS — M25511 Pain in right shoulder: Secondary | ICD-10-CM | POA: Diagnosis not present

## 2021-08-20 DIAGNOSIS — M6281 Muscle weakness (generalized): Secondary | ICD-10-CM

## 2021-08-20 NOTE — Therapy (Signed)
[facility] [address] [place], [address] Phone: [phone]   Fax:  [phone]  Physical Therapy Treatment/Physical Therapy Progress Note   Dates of reporting period  06/25/22  to   08/20/21  Patient Details  Name: Lynn Mann MRN: [id number] Date of Birth: 10/04/1950 Referring Provider (PT): Rosalia Hammers, DO   Encounter Date: 08/20/2021   PT End of Session - 08/20/21 1438     Visit Number 20    Number of Visits 29    Date for PT Re-Evaluation 09/06/21    Authorization Type 9    Authorization Time Period 10    PT Start Time 1430    PT Stop Time 1520    PT Time Calculation (min) 50 min    Activity Tolerance Patient tolerated treatment well    Behavior During Therapy [facility] for tasks assessed/performed             Past Medical History:  Diagnosis Date   Arthritis    C. difficile diarrhea    age 14s-40s    Chicken pox    Cholecystitis 11/2011   Did not require sgy - Dr. Staci Acosta - [facility]  (cholelithiasis)   H/O Clostridium difficile infection    IBS (irritable bowel syndrome)    MRSA exposure 2005   Spider bite   MVP (mitral valve prolapse)    Stable - Dr. Ubaldo Glassing   Rheumatic fever     Past Surgical History:  Procedure Laterality Date   CATARACT EXTRACTION  [id number]   MUSCLE BIOPSY      There were no vitals filed for this visit.   Subjective Assessment - 08/20/21 1431     Subjective Pt reporting in the last 2 weeks or so her [address] joint seems equal to L side. Pt has concerns of possible other concerns at [facility] joint because she believes the symptoms should be better.    Pertinent History R shoulder did not start really hurting until about a month after her fall. Just started with a little pain at the [facility] joint and clavicle. Sometimes she also feels pain along her R pectoralis muscle in the shape of a triangle. Pt fell June 2022. Pt got up to go to the bathroom and tripped over a suitcase.  Pt fell against the garment rack on her R side which kind of shielded her R side. Pt also got a hematoma the size of a baked potato at her L leg afterwards. Pt hurt R toes during the fall which got better after 3 days. Went to urgent care for her leg. Hematoma has improved but still has a tiny knot in her L leg. Pt is R hand dominant. Pt was in PT around May to July 2022 for for her R hip which is better.    Patient Stated Goals Not hurt.    Currently in Pain? Yes    Pain Score 1     Pain Orientation Right    Pain Descriptors / Indicators Aching    Pain Onset More than a month ago              There.ex:      Reassessment of goals: Please see clinical impression and long term goals section  Seated cervical retractions: 1x20. Cuing for form/technique  Seated L cervical rotation with R lat flexion for R SCM activation: 1x10. Required PT demo and min TC's for form/technique.  Seated scap retractions: YTB, 1x20  Standing  shoulder extension with YTB and elbows straight: 1x20  Standing doorway pec stretch: 2x30 sec  Seated B shoulder adduction: 1x20. Through varying degrees of shoulder abduction to target varying pectoralis muscle fiber directions.     PT Short Term Goals - 07/18/21 1111       PT SHORT TERM GOAL #1   Title Pt will be independent with her initial HEP to decrease pain, improve strength, function, and ability to reach, push, and pull more comfortably.    Baseline Pt has started her HEP (05/22/2021); Pt performing her HEP. Has some questions but forgot what they are (06/25/2021), No questions with HEP, doing them (07/18/2021)    Time 3    Period Weeks    Status Achieved    Target Date 06/14/21               PT Long Term Goals - 08/20/21 1439       PT LONG TERM GOAL #1   Title Pt will have a decrease in R shoulder pain to 3/10 or less at worst to promote ability to reach, open and close the door, lift items more comfortably.    Baseline 10/10 R shoulder pain at  worst for the past 2 months (05/22/2021); 6/10 at worst for the past 7 days (06/25/2021); 9-10/10 at worst for the past 7 days (07/18/2021); (07/24/2021); 08/20/21: Worst pain reported at 8/10 NPS.    Time 6    Period Weeks    Status On-going    Target Date 09/06/21      PT LONG TERM GOAL #2   Title Pt will improve R shoulder flexion AROM to 150 degrees or more without pain to promote ability to reach.    Baseline 90 degrees R shoulder flexion AROM with pain (05/22/2021); 123 degrees flexion AROM, 147 degrees after resisted extension exercise (06/25/2021); 128 degrees flexion,, 97 degrees abduction  (07/18/2021); 36 degrees flexion (07/24/2021); 08/20/21: 135 degrees    Time 6    Period Weeks    Status On-going    Target Date 09/06/21      PT LONG TERM GOAL #3   Title Pt will improve her R shoulder strength all planes by at least 1/2 MMT grade to promote ability to reach as well as lift items.    Baseline R shoulder flexion 4/5, abduction 4-/5, ER 4+/5, IR 4/5 (05/22/2021); R shoulder flexion 4+/5, abduction 4/5, ER 4+/5, IR 4+/5 (06/25/2021); R shoulder flexion 4/5, abduction 4/5, ER 4/5, IR 5/5    Time 6    Period Weeks    Status On-going    Target Date 09/06/21      PT LONG TERM GOAL #4   Title Pt will improve R shoulder FOTO score by at least 10 points as a demonstration of improved function.    Baseline R shoulder FOTO 49 (05/22/2021); 59 (06/25/2021)    Time 8    Period Weeks    Status Achieved    Target Date 07/19/21                   Plan - 08/20/21 1458     Clinical Impression Statement Continuing PT POC with focus on pain relief and shoulder strength/mobility. Progress note performed. Pt improving in R shoulder flexion AROM to 135 deg which is improved from previous re-assessment. Otherwise goals in status quo with strength testing however shoulder IR appears 5/5 but overall MMT is limited due to intra-rater reliability. Primary PT should perform for full  understanding  of strength. Overall pt continuing to have instances of 8/10 NPS to sometimes 10/10. Attempts made for postural exercise for pec and anterior cervical stretching to reduce compression forces of [place] joint. Pt will continue to benefit from skilled PT services to further progress strength/shoulder mobility and improved pain with ADL tasks.    Personal Factors and Comorbidities Age;Comorbidity 2    Comorbidities Arthritis, Mitral valve prolapse    Examination-Activity Limitations Lift;Reach Overhead;Carry;Bed Mobility    Stability/Clinical Decision Making Stable/Uncomplicated   inconsistent progress   Rehab Potential Fair    PT Frequency 2x / week    PT Duration 6 weeks    PT Treatment/Interventions Therapeutic activities;Therapeutic exercise;Manual techniques;Electrical Stimulation;Iontophoresis 4mg /ml Dexamethasone;Functional mobility training;Neuromuscular re-education;Patient/family education;Dry needling    PT Next Visit Plan reassess MMT for understanding if strength has improved. Check in on pt with new exercises performed last session.    PT [place] Access Code [email or web address]    Consulted and Agree with Plan of Care Patient             Patient will benefit from skilled therapeutic intervention in order to improve the following deficits and impairments:  Pain, Improper body mechanics, Postural dysfunction, Decreased strength  Visit Diagnosis: Chronic right shoulder pain  Muscle weakness (generalized)     Problem List Patient Active Problem List   Diagnosis Date Noted   Right shoulder pain 07/15/2021   Hematoma 02/11/2021   Ankle swelling 11/26/2020   Low back pain 11/22/2020   Hip pain, right 11/22/2020   Light headedness 06/24/2020   Hearing loss 06/24/2020   Change in vision 06/24/2020   Stress 05/17/2020   Chest pain 05/16/2020   Welcome to Medicare preventive visit 06/20/2019   Viral syndrome 01/30/2019   Itching 07/21/2017   Asthma  07/16/2016   Urinary incontinence 07/16/2016   SOB (shortness of breath) 04/01/2016   Bilateral shoulder pain 02/24/2016   Fatigue 01/28/2016   Neck fullness 01/28/2016   Routine general medical examination at a health care facility 07/25/2015   Health care maintenance 10/09/2014   Neck pain 11/26/2013   Hypercholesterolemia 11/26/2013   History of colonic polyps 05/11/2013   Hyperbilirubinemia 01/18/2013   GERD (gastroesophageal reflux disease) 12/03/2012   Cholelithiasis 11/07/2012   IBS (irritable bowel syndrome) 11/07/2012   History of rheumatic fever 08/28/2012   MVP (mitral valve prolapse) 08/28/2012    Salem Caster. Fairly IV, PT, DPT Physical Therapist- [facility]  08/20/2021, 3:32 PM  [facility] [address], [place], [address] Phone: [phone]   Fax:  [phone]  Name: Lynn Mann MRN: [id number] Date of Birth: May 10, 1951

## 2021-08-22 ENCOUNTER — Ambulatory Visit: Payer: PPO

## 2021-08-25 ENCOUNTER — Other Ambulatory Visit: Payer: Self-pay | Admitting: Internal Medicine

## 2021-08-25 DIAGNOSIS — J111 Influenza due to unidentified influenza virus with other respiratory manifestations: Secondary | ICD-10-CM

## 2021-08-27 ENCOUNTER — Ambulatory Visit: Payer: PPO

## 2021-08-29 ENCOUNTER — Other Ambulatory Visit: Payer: Self-pay

## 2021-08-29 ENCOUNTER — Ambulatory Visit: Payer: PPO | Attending: Sports Medicine

## 2021-08-29 DIAGNOSIS — G8929 Other chronic pain: Secondary | ICD-10-CM | POA: Diagnosis not present

## 2021-08-29 DIAGNOSIS — M6281 Muscle weakness (generalized): Secondary | ICD-10-CM | POA: Insufficient documentation

## 2021-08-29 DIAGNOSIS — M25511 Pain in right shoulder: Secondary | ICD-10-CM | POA: Diagnosis not present

## 2021-08-29 NOTE — Therapy (Signed)
[facility] [address] [place], [address] Phone: [phone]   Fax:  [phone]  Physical Therapy Treatment  Patient Details  Name: Lynn Mann MRN: [id number] Date of Birth: 12/14/1950 Referring Provider (PT): Rosalia Hammers, DO   Encounter Date: 08/29/2021   PT End of Session - 08/29/21 1427     Visit Number 21    Number of Visits 29    Date for PT Re-Evaluation 09/06/21    Authorization Type 9    Authorization Time Period 10    PT Start Time 1424    PT Stop Time 1502    PT Time Calculation (min) 38 min    Activity Tolerance Patient tolerated treatment well    Behavior During Therapy [facility] for tasks assessed/performed             Past Medical History:  Diagnosis Date   Arthritis    C. difficile diarrhea    age 77s-40s    Chicken pox    Cholecystitis 11/2011   Did not require sgy - Dr. Staci Acosta - [facility]  (cholelithiasis)   H/O Clostridium difficile infection    IBS (irritable bowel syndrome)    MRSA exposure 2005   Spider bite   MVP (mitral valve prolapse)    Stable - Dr. Ubaldo Glassing   Rheumatic fever     Past Surgical History:  Procedure Laterality Date   CATARACT EXTRACTION  [id number]   MUSCLE BIOPSY      There were no vitals filed for this visit.   Subjective Assessment - 08/29/21 1424     Subjective Pt missing last week due to feeling sick and car trouble. R shoulder currently hurting  that is her baseline achiness. Has pain with reaching forward.    Pertinent History R shoulder did not start really hurting until about a month after her fall. Just started with a little pain at the [facility] joint and clavicle. Sometimes she also feels pain along her R pectoralis muscle in the shape of a triangle. Pt fell June 2022. Pt got up to go to the bathroom and tripped over a suitcase. Pt fell against the garment rack on her R side which kind of shielded her R side. Pt also got a hematoma the size of a  baked potato at her L leg afterwards. Pt hurt R toes during the fall which got better after 3 days. Went to urgent care for her leg. Hematoma has improved but still has a tiny knot in her L leg. Pt is R hand dominant. Pt was in PT around May to July 2022 for for her R hip which is better.    Patient Stated Goals Not hurt.    Currently in Pain? Yes    Pain Score 1     Pain Orientation Right    Pain Descriptors / Indicators Aching    Pain Type Chronic pain    Pain Onset More than a month ago             There.ex:   Supine R shoulder flexion in scap plane: x20   Supine R shoulder abduction: x20  Supine R shoulder extension isometrics into table: 8 sec holds, x15   Supine chin tucks: x20, 2-3 sec hold  Supine upper cervical flexion/chin nods: x20, Min TC's for form/technique.    Supine chest press with no resistance. X20. Min TC's for form/technique and improvement in symptoms. Light, manual stability provided at R SCJ  with reduced symptoms. Pt also endorsing R sided shoulder pain at [facility] joint. Pain improves with gentle, grade 2 inferior AC joint mobs. X5, 2-3 sec bouts.   Attempts made for supine exercises today to see if increased spinal support/stability assisted in pt symptoms with R shoulder and cervical motions. Per subjective reports, similar symptoms with seated versus supine.   PT Education - 08/29/21 1426     Education Details form/technique with exercise    Person(s) Educated Patient    Methods Explanation;Demonstration;Tactile cues;Verbal cues    Comprehension Verbalized understanding;Returned demonstration;Verbal cues required;Tactile cues required              PT Short Term Goals - 07/18/21 1111       PT SHORT TERM GOAL #1   Title Pt will be independent with her initial HEP to decrease pain, improve strength, function, and ability to reach, push, and pull more comfortably.    Baseline Pt has started her HEP (05/22/2021); Pt performing her HEP. Has some questions  but forgot what they are (06/25/2021), No questions with HEP, doing them (07/18/2021)    Time 3    Period Weeks    Status Achieved    Target Date 06/14/21               PT Long Term Goals - 08/20/21 1439       PT LONG TERM GOAL #1   Title Pt will have a decrease in R shoulder pain to 3/10 or less at worst to promote ability to reach, open and close the door, lift items more comfortably.    Baseline 10/10 R shoulder pain at worst for the past 2 months (05/22/2021); 6/10 at worst for the past 7 days (06/25/2021); 9-10/10 at worst for the past 7 days (07/18/2021); (07/24/2021); 08/20/21: Worst pain reported at 8/10 NPS.    Time 6    Period Weeks    Status On-going    Target Date 09/06/21      PT LONG TERM GOAL #2   Title Pt will improve R shoulder flexion AROM to 150 degrees or more without pain to promote ability to reach.    Baseline 90 degrees R shoulder flexion AROM with pain (05/22/2021); 123 degrees flexion AROM, 147 degrees after resisted extension exercise (06/25/2021); 128 degrees flexion,, 97 degrees abduction  (07/18/2021); 36 degrees flexion (07/24/2021); 08/20/21: 135 degrees    Time 6    Period Weeks    Status On-going    Target Date 09/06/21      PT LONG TERM GOAL #3   Title Pt will improve her R shoulder strength all planes by at least 1/2 MMT grade to promote ability to reach as well as lift items.    Baseline R shoulder flexion 4/5, abduction 4-/5, ER 4+/5, IR 4/5 (05/22/2021); R shoulder flexion 4+/5, abduction 4/5, ER 4+/5, IR 4+/5 (06/25/2021); R shoulder flexion 4/5, abduction 4/5, ER 4/5, IR 5/5    Time 6    Period Weeks    Status On-going    Target Date 09/06/21      PT LONG TERM GOAL #4   Title Pt will improve R shoulder FOTO score by at least 10 points as a demonstration of improved function.    Baseline R shoulder FOTO 49 (05/22/2021); 59 (06/25/2021)    Time 8    Period Weeks    Status Achieved    Target Date 07/19/21  Plan - 08/29/21 1509     Clinical Impression Statement Continuing PT POC with focus on strength and R Shoulder AROM. Therex with focus on anterior cervical neck flexor strength and pec strength for improved R sternoclavicular joint stability. Pt did report reduction in symptoms with light, manual support provided to SCJ with shoulder abduction. Pt will continue to benefit from skilled PT services to further progress strength/mobility and improved pain with ADL completion.    Personal Factors and Comorbidities Age;Comorbidity 2    Comorbidities Arthritis, Mitral valve prolapse    Examination-Activity Limitations Lift;Reach Overhead;Carry;Bed Mobility    Stability/Clinical Decision Making Stable/Uncomplicated   inconsistent progress   Rehab Potential Fair    PT Frequency 2x / week    PT Duration 6 weeks    PT Treatment/Interventions Therapeutic activities;Therapeutic exercise;Manual techniques;Electrical Stimulation;Iontophoresis 4mg /ml Dexamethasone;Functional mobility training;Neuromuscular re-education;Patient/family education;Dry needling    PT Next Visit Plan reassess MMT for understanding if strength has improved. Check in on pt with new exercises performed last session.    PT [facility] Access Code [email or web address]    Consulted and Agree with Plan of Care Patient             Patient will benefit from skilled therapeutic intervention in order to improve the following deficits and impairments:  Pain, Improper body mechanics, Postural dysfunction, Decreased strength  Visit Diagnosis: Chronic right shoulder pain  Muscle weakness (generalized)     Problem List Patient Active Problem List   Diagnosis Date Noted   Right shoulder pain 07/15/2021   Hematoma 02/11/2021   Ankle swelling 11/26/2020   Low back pain 11/22/2020   Hip pain, right 11/22/2020   Light headedness 06/24/2020   Hearing loss 06/24/2020   Change in vision 06/24/2020   Stress 05/17/2020   Chest pain  05/16/2020   Welcome to Medicare preventive visit 06/20/2019   Viral syndrome 01/30/2019   Itching 07/21/2017   Asthma 07/16/2016   Urinary incontinence 07/16/2016   SOB (shortness of breath) 04/01/2016   Bilateral shoulder pain 02/24/2016   Fatigue 01/28/2016   Neck fullness 01/28/2016   Routine general medical examination at a health care facility 07/25/2015   Health care maintenance 10/09/2014   Neck pain 11/26/2013   Hypercholesterolemia 11/26/2013   History of colonic polyps 05/11/2013   Hyperbilirubinemia 01/18/2013   GERD (gastroesophageal reflux disease) 12/03/2012   Cholelithiasis 11/07/2012   IBS (irritable bowel syndrome) 11/07/2012   History of rheumatic fever 08/28/2012   MVP (mitral valve prolapse) 08/28/2012    Salem Caster. Fairly IV, PT, DPT Physical Therapist- [facility]  08/29/2021, 3:16 PM  [facility] [address], [place], [address] Phone: [phone]   Fax:  [phone]  Name: Lynn Mann MRN: [id number] Date of Birth: [birth year]

## 2021-08-31 ENCOUNTER — Telehealth: Payer: Self-pay | Admitting: Internal Medicine

## 2021-08-31 NOTE — Telephone Encounter (Signed)
Patient called in Ventolin inhaler  [phone] Base not cover by [employer]  requesting a replacement inhaler that will be covered . Patient also has not  started taking Rosuvastatin (crestor) 5 mg because patient is in physical therapy for pain and neck. Patient  would like for you to call her @ [phone]

## 2021-09-03 ENCOUNTER — Other Ambulatory Visit: Payer: Self-pay

## 2021-09-03 ENCOUNTER — Ambulatory Visit: Payer: PPO

## 2021-09-03 DIAGNOSIS — M25511 Pain in right shoulder: Secondary | ICD-10-CM | POA: Diagnosis not present

## 2021-09-03 DIAGNOSIS — G8929 Other chronic pain: Secondary | ICD-10-CM

## 2021-09-03 DIAGNOSIS — M6281 Muscle weakness (generalized): Secondary | ICD-10-CM

## 2021-09-03 NOTE — Therapy (Signed)
[facility] [address], [place], [address] Phone: [phone]   Fax:  [phone]  Physical Therapy Treatment  Patient Details  Name: Lynn Mann MRN: [id number] Date of Birth: Oct 05, 1950 Referring Provider (PT): Rosalia Hammers, DO   Encounter Date: 09/03/2021   PT End of Session - 09/03/21 1518     Visit Number 22    Number of Visits 29    Date for PT Re-Evaluation 09/06/21    Authorization Type 9    Authorization Time Period 10    PT Start Time 1425    PT Stop Time 1501    PT Time Calculation (min) 36 min    Activity Tolerance Patient tolerated treatment well    Behavior During Therapy [facility] for tasks assessed/performed             Past Medical History:  Diagnosis Date   Arthritis    C. difficile diarrhea    age 64s-40s    Chicken pox    Cholecystitis 11/2011   Did not require sgy - Dr. Staci Acosta - [facility]  (cholelithiasis)   H/O Clostridium difficile infection    IBS (irritable bowel syndrome)    MRSA exposure 2005   Spider bite   MVP (mitral valve prolapse)    Stable - Dr. Ubaldo Glassing   Rheumatic fever     Past Surgical History:  Procedure Laterality Date   CATARACT EXTRACTION  [id number]   MUSCLE BIOPSY      There were no vitals filed for this visit.   Subjective Assessment - 09/03/21 1428     Subjective Pt reports body aches and upset stomach. Pt reports taking negative covid test. Pain reported today at a 4/10 NPS. Had a flare up in anterior neck musculature.    Pertinent History R shoulder did not start really hurting until about a month after her fall. Just started with a little pain at the [facility] joint and clavicle. Sometimes she also feels pain along her R pectoralis muscle in the shape of a triangle. Pt fell June 2022. Pt got up to go to the bathroom and tripped over a suitcase. Pt fell against the garment rack on her R side which kind of shielded her R side. Pt also got a hematoma the  size of a baked potato at her L leg afterwards. Pt hurt R toes during the fall which got better after 3 days. Went to urgent care for her leg. Hematoma has improved but still has a tiny knot in her L leg. Pt is R hand dominant. Pt was in PT around May to July 2022 for for her R hip which is better.    Patient Stated Goals Not hurt.    Currently in Pain? Yes    Pain Score 4     Pain Location Neck    Pain Orientation Right    Pain Descriptors / Indicators Aching    Pain Type Chronic pain    Pain Onset More than a month ago              Manual Therapy:   Pt TTP along R sternocleidomastoid, scalenes and upper trap. Pt in supine for 34 minutes with varying degrees of L cervical rotation with focus on gentle STM techniques to reduce anterior neck pain. Additional cervical lateral flexion + cervical rotation for upper trap and SCM stretch 2x30 sec/direction R and L.  Pt educated on normal side affects of STM such as  redness to areas focused on and muscle soreness.       PT Education - 09/03/21 1432     Education Details form/technique with exercise, manual therapy.    Person(s) Educated Patient    Methods Explanation;Demonstration;Tactile cues;Verbal cues    Comprehension Verbalized understanding;Verbal cues required;Returned demonstration;Tactile cues required              PT Short Term Goals - 07/18/21 1111       PT SHORT TERM GOAL #1   Title Pt will be independent with her initial HEP to decrease pain, improve strength, function, and ability to reach, push, and pull more comfortably.    Baseline Pt has started her HEP (05/22/2021); Pt performing her HEP. Has some questions but forgot what they are (06/25/2021), No questions with HEP, doing them (07/18/2021)    Time 3    Period Weeks    Status Achieved    Target Date 06/14/21               PT Long Term Goals - 08/20/21 1439       PT LONG TERM GOAL #1   Title Pt will have a decrease in R shoulder pain to 3/10 or  less at worst to promote ability to reach, open and close the door, lift items more comfortably.    Baseline 10/10 R shoulder pain at worst for the past 2 months (05/22/2021); 6/10 at worst for the past 7 days (06/25/2021); 9-10/10 at worst for the past 7 days (07/18/2021); (07/24/2021); 08/20/21: Worst pain reported at 8/10 NPS.    Time 6    Period Weeks    Status On-going    Target Date 09/06/21      PT LONG TERM GOAL #2   Title Pt will improve R shoulder flexion AROM to 150 degrees or more without pain to promote ability to reach.    Baseline 90 degrees R shoulder flexion AROM with pain (05/22/2021); 123 degrees flexion AROM, 147 degrees after resisted extension exercise (06/25/2021); 128 degrees flexion,, 97 degrees abduction  (07/18/2021); 36 degrees flexion (07/24/2021); 08/20/21: 135 degrees    Time 6    Period Weeks    Status On-going    Target Date 09/06/21      PT LONG TERM GOAL #3   Title Pt will improve her R shoulder strength all planes by at least 1/2 MMT grade to promote ability to reach as well as lift items.    Baseline R shoulder flexion 4/5, abduction 4-/5, ER 4+/5, IR 4/5 (05/22/2021); R shoulder flexion 4+/5, abduction 4/5, ER 4+/5, IR 4+/5 (06/25/2021); R shoulder flexion 4/5, abduction 4/5, ER 4/5, IR 5/5    Time 6    Period Weeks    Status On-going    Target Date 09/06/21      PT LONG TERM GOAL #4   Title Pt will improve R shoulder FOTO score by at least 10 points as a demonstration of improved function.    Baseline R shoulder FOTO 49 (05/22/2021); 59 (06/25/2021)    Time 8    Period Weeks    Status Achieved    Target Date 07/19/21                   Plan - 09/03/21 1528     Clinical Impression Statement Due to pt being late and feeling unwell and reports of increased anterior neck pain, focus of session on gentle STM to anterior neck musculature in supine. Noted TTP along R  sternocleidomastoid, upper trap, and scalenes. Pt endorses pain as the same  post session but does report improvemenr in stiffness. Pt educated on increased redness to areas worked on and normal side effects expected with STM. Pt will continue to benefit from skilled PT services to improve pain and overhead mobility with RUE for ADL completion.    Personal Factors and Comorbidities Age;Comorbidity 2    Comorbidities Arthritis, Mitral valve prolapse    Examination-Activity Limitations Lift;Reach Overhead;Carry;Bed Mobility    Stability/Clinical Decision Making Stable/Uncomplicated   inconsistent progress   Rehab Potential Fair    PT Frequency 2x / week    PT Duration 6 weeks    PT Treatment/Interventions Therapeutic activities;Therapeutic exercise;Manual techniques;Electrical Stimulation;Iontophoresis 4mg /ml Dexamethasone;Functional mobility training;Neuromuscular re-education;Patient/family education;Dry needling    PT Next Visit Plan reassess MMT for understanding if strength has improved. Check in on pt with new exercises performed last session.    PT [facility] Access Code [email or web address]    Consulted and Agree with Plan of Care Patient             Patient will benefit from skilled therapeutic intervention in order to improve the following deficits and impairments:  Pain, Improper body mechanics, Postural dysfunction, Decreased strength  Visit Diagnosis: Chronic right shoulder pain  Muscle weakness (generalized)     Problem List Patient Active Problem List   Diagnosis Date Noted   Right shoulder pain 07/15/2021   Hematoma 02/11/2021   Ankle swelling 11/26/2020   Low back pain 11/22/2020   Hip pain, right 11/22/2020   Light headedness 06/24/2020   Hearing loss 06/24/2020   Change in vision 06/24/2020   Stress 05/17/2020   Chest pain 05/16/2020   Welcome to Medicare preventive visit 06/20/2019   Viral syndrome 01/30/2019   Itching 07/21/2017   Asthma 07/16/2016   Urinary incontinence 07/16/2016   SOB (shortness of breath) 04/01/2016    Bilateral shoulder pain 02/24/2016   Fatigue 01/28/2016   Neck fullness 01/28/2016   Routine general medical examination at a health care facility 07/25/2015   Health care maintenance 10/09/2014   Neck pain 11/26/2013   Hypercholesterolemia 11/26/2013   History of colonic polyps 05/11/2013   Hyperbilirubinemia 01/18/2013   GERD (gastroesophageal reflux disease) 12/03/2012   Cholelithiasis 11/07/2012   IBS (irritable bowel syndrome) 11/07/2012   History of rheumatic fever 08/28/2012   MVP (mitral valve prolapse) 08/28/2012    Salem Caster. Fairly IV, PT, DPT Physical Therapist- [facility]  09/03/2021, 3:52 PM  [facility] [address] [place], [address] Phone: [phone]   Fax:  [phone]  Name: Lynn Mann MRN: [id number] Date of Birth: [birth year]

## 2021-09-04 ENCOUNTER — Other Ambulatory Visit: Payer: Self-pay

## 2021-09-04 DIAGNOSIS — J111 Influenza due to unidentified influenza virus with other respiratory manifestations: Secondary | ICD-10-CM

## 2021-09-04 NOTE — Telephone Encounter (Signed)
LMTCB

## 2021-09-04 NOTE — Telephone Encounter (Signed)
Patient return call to Larena Glassman , please call patient @ [phone]

## 2021-09-05 ENCOUNTER — Ambulatory Visit: Payer: PPO

## 2021-09-05 ENCOUNTER — Other Ambulatory Visit: Payer: Self-pay

## 2021-09-05 DIAGNOSIS — M6281 Muscle weakness (generalized): Secondary | ICD-10-CM

## 2021-09-05 DIAGNOSIS — G8929 Other chronic pain: Secondary | ICD-10-CM

## 2021-09-05 DIAGNOSIS — M25511 Pain in right shoulder: Secondary | ICD-10-CM | POA: Diagnosis not present

## 2021-09-05 MED ORDER — ALBUTEROL SULFATE HFA 108 (90 BASE) MCG/ACT IN AERS: 2.0000 | INHALATION_SPRAY | Freq: Four times a day (QID) | RESPIRATORY_TRACT | 1 refills | Status: DC | PRN

## 2021-09-05 NOTE — Therapy (Signed)
[place] [facility] [address] [place], [address] Phone: [phone]   Fax:  [phone]  Physical Therapy Treatment  Patient Details  Name: Lynn Mann MRN: [id number] Date of Birth: 07/07/1951 Referring Provider (PT): Rosalia Hammers, DO   Encounter Date: 09/05/2021   PT End of Session - 09/05/21 1506     Visit Number 23    Number of Visits 29    Date for PT Re-Evaluation 09/06/21    Authorization Type 9    Authorization Time Period 10    PT Start Time 1422    PT Stop Time 1502    PT Time Calculation (min) 40 min    Activity Tolerance Patient tolerated treatment well    Behavior During Therapy [facility] for tasks assessed/performed             Past Medical History:  Diagnosis Date   Arthritis    C. difficile diarrhea    age 62s-40s    Chicken pox    Cholecystitis 11/2011   Did not require sgy - Dr. Staci Acosta - [facility]  (cholelithiasis)   H/O Clostridium difficile infection    IBS (irritable bowel syndrome)    MRSA exposure 2005   Spider bite   MVP (mitral valve prolapse)    Stable - Dr. Ubaldo Glassing   Rheumatic fever     Past Surgical History:  Procedure Laterality Date   CATARACT EXTRACTION  [id number]   MUSCLE BIOPSY      There were no vitals filed for this visit.   Subjective Assessment - 09/05/21 1424     Subjective Pt reports pain relief with STM to anterior cervical musculature.    Pertinent History R shoulder did not start really hurting until about a month after her fall. Just started with a little pain at the [facility] joint and clavicle. Sometimes she also feels pain along her R pectoralis muscle in the shape of a triangle. Pt fell June 2022. Pt got up to go to the bathroom and tripped over a suitcase. Pt fell against the garment rack on her R side which kind of shielded her R side. Pt also got a hematoma the size of a baked potato at her L leg afterwards. Pt hurt R toes during the fall which got  better after 3 days. Went to urgent care for her leg. Hematoma has improved but still has a tiny knot in her L leg. Pt is R hand dominant. Pt was in PT around May to July 2022 for for her R hip which is better.    Patient Stated Goals Not hurt.    Currently in Pain? Yes    Pain Score 2     Pain Location Neck    Pain Orientation Right    Pain Onset More than a month ago             There.ex:   Supine chin tucks: 1x20   Supine chin nods for deep neck flexion: 1x20   Cervical flexion isometrics: x10, 5 sec holds/direction  B shoulder adduction through varying deg of shoulder elevation: x20, elbows extended  Seated resisted B shoulder ER + scap retraction/depression with RTB: x20, min TC's for form/technique.     Manual Therapy: 20 min with pt in hook lying STM to R cervical sternocleidomastoid and scalenes for pain modulation and reduction in muscle tissue tension at [facility] joint.   R upper trap stretch with overpressure at R GHJ, 2x30 sec  for improve tissue extensibility     Post session pt endorses pain relief in anterior neck and R [place] Joint.        PT Education - 09/05/21 1426     Education Details STM, form/technique with exercise    Person(s) Educated Patient    Methods Explanation;Demonstration;Tactile cues;Verbal cues    Comprehension Verbalized understanding;Returned demonstration;Verbal cues required;Tactile cues required              PT Short Term Goals - 07/18/21 1111       PT SHORT TERM GOAL #1   Title Pt will be independent with her initial HEP to decrease pain, improve strength, function, and ability to reach, push, and pull more comfortably.    Baseline Pt has started her HEP (05/22/2021); Pt performing her HEP. Has some questions but forgot what they are (06/25/2021), No questions with HEP, doing them (07/18/2021)    Time 3    Period Weeks    Status Achieved    Target Date 06/14/21               PT Long Term Goals - 08/20/21 1439       PT  LONG TERM GOAL #1   Title Pt will have a decrease in R shoulder pain to 3/10 or less at worst to promote ability to reach, open and close the door, lift items more comfortably.    Baseline 10/10 R shoulder pain at worst for the past 2 months (05/22/2021); 6/10 at worst for the past 7 days (06/25/2021); 9-10/10 at worst for the past 7 days (07/18/2021); (07/24/2021); 08/20/21: Worst pain reported at 8/10 NPS.    Time 6    Period Weeks    Status On-going    Target Date 09/06/21      PT LONG TERM GOAL #2   Title Pt will improve R shoulder flexion AROM to 150 degrees or more without pain to promote ability to reach.    Baseline 90 degrees R shoulder flexion AROM with pain (05/22/2021); 123 degrees flexion AROM, 147 degrees after resisted extension exercise (06/25/2021); 128 degrees flexion,, 97 degrees abduction  (07/18/2021); 36 degrees flexion (07/24/2021); 08/20/21: 135 degrees    Time 6    Period Weeks    Status On-going    Target Date 09/06/21      PT LONG TERM GOAL #3   Title Pt will improve her R shoulder strength all planes by at least 1/2 MMT grade to promote ability to reach as well as lift items.    Baseline R shoulder flexion 4/5, abduction 4-/5, ER 4+/5, IR 4/5 (05/22/2021); R shoulder flexion 4+/5, abduction 4/5, ER 4+/5, IR 4+/5 (06/25/2021); R shoulder flexion 4/5, abduction 4/5, ER 4/5, IR 5/5    Time 6    Period Weeks    Status On-going    Target Date 09/06/21      PT LONG TERM GOAL #4   Title Pt will improve R shoulder FOTO score by at least 10 points as a demonstration of improved function.    Baseline R shoulder FOTO 49 (05/22/2021); 59 (06/25/2021)    Time 8    Period Weeks    Status Achieved    Target Date 07/19/21                   Plan - 09/05/21 1508     Clinical Impression Statement Continuing anterior cervical STM along R scalenes and sternocleidomastoid to reduce anterior neck pain and reduce tension along  [place] joint. Continuing to focus on anterior  cervical muscle strengthening and pectoralisus muscle activation for stabilization of [place] joint. Pt continuing to be limited to progressing mobility and strength due to pain. Pt will need reassessment of goals next session for either PT recert or discharge.    Personal Factors and Comorbidities Age;Comorbidity 2    Comorbidities Arthritis, Mitral valve prolapse    Examination-Activity Limitations Lift;Reach Overhead;Carry;Bed Mobility    Stability/Clinical Decision Making Stable/Uncomplicated   inconsistent progress   Rehab Potential Fair    PT Frequency 2x / week    PT Duration 6 weeks    PT Treatment/Interventions Therapeutic activities;Therapeutic exercise;Manual techniques;Electrical Stimulation;Iontophoresis 4mg /ml Dexamethasone;Functional mobility training;Neuromuscular re-education;Patient/family education;Dry needling    PT Next Visit Plan reassess MMT for understanding if strength has improved. Check in on pt with new exercises performed last session.    PT [employer] Access Code [email or web address]    Consulted and Agree with Plan of Care Patient             Patient will benefit from skilled therapeutic intervention in order to improve the following deficits and impairments:  Pain, Improper body mechanics, Postural dysfunction, Decreased strength  Visit Diagnosis: Chronic right shoulder pain  Muscle weakness (generalized)     Problem List Patient Active Problem List   Diagnosis Date Noted   Right shoulder pain 07/15/2021   Hematoma 02/11/2021   Ankle swelling 11/26/2020   Low back pain 11/22/2020   Hip pain, right 11/22/2020   Light headedness 06/24/2020   Hearing loss 06/24/2020   Change in vision 06/24/2020   Stress 05/17/2020   Chest pain 05/16/2020   Welcome to Medicare preventive visit 06/20/2019   Viral syndrome 01/30/2019   Itching 07/21/2017   Asthma 07/16/2016   Urinary incontinence 07/16/2016   SOB (shortness of breath) 04/01/2016   Bilateral  shoulder pain 02/24/2016   Fatigue 01/28/2016   Neck fullness 01/28/2016   Routine general medical examination at a health care facility 07/25/2015   Health care maintenance 10/09/2014   Neck pain 11/26/2013   Hypercholesterolemia 11/26/2013   History of colonic polyps 05/11/2013   Hyperbilirubinemia 01/18/2013   GERD (gastroesophageal reflux disease) 12/03/2012   Cholelithiasis 11/07/2012   IBS (irritable bowel syndrome) 11/07/2012   History of rheumatic fever 08/28/2012   MVP (mitral valve prolapse) 08/28/2012    Salem Caster. Fairly IV, PT, DPT Physical Therapist- [facility]  09/05/2021, 3:36 PM  [place] [facility] [address] [place], [address] Phone: [phone]   Fax:  [phone]  Name: Lynn Mann MRN: [id number] Date of Birth: 01-18-1951

## 2021-09-05 NOTE — Telephone Encounter (Signed)
Pt stated that her insurance no longer pays for ventolin. Needs to be albuterol. Ok to send in and if so which inhaler?

## 2021-09-05 NOTE — Telephone Encounter (Signed)
LM for patient

## 2021-09-05 NOTE — Telephone Encounter (Signed)
Rx sent in for albuterol inhaler.  Please notify pt and confirm doing ok.

## 2021-09-07 ENCOUNTER — Ambulatory Visit (INDEPENDENT_AMBULATORY_CARE_PROVIDER_SITE_OTHER): Payer: PPO

## 2021-09-07 ENCOUNTER — Other Ambulatory Visit: Payer: Self-pay

## 2021-09-07 ENCOUNTER — Ambulatory Visit (INDEPENDENT_AMBULATORY_CARE_PROVIDER_SITE_OTHER): Payer: PPO | Admitting: Internal Medicine

## 2021-09-07 VITALS — BP 130/70 | HR 90 | Temp 97.8°F | Resp 16 | Ht 67.0 in | Wt 185.4 lb

## 2021-09-07 DIAGNOSIS — M25511 Pain in right shoulder: Secondary | ICD-10-CM | POA: Diagnosis not present

## 2021-09-07 DIAGNOSIS — E7800 Pure hypercholesterolemia, unspecified: Secondary | ICD-10-CM | POA: Diagnosis not present

## 2021-09-07 DIAGNOSIS — J4520 Mild intermittent asthma, uncomplicated: Secondary | ICD-10-CM

## 2021-09-07 DIAGNOSIS — T148XXA Other injury of unspecified body region, initial encounter: Secondary | ICD-10-CM | POA: Diagnosis not present

## 2021-09-07 DIAGNOSIS — R059 Cough, unspecified: Secondary | ICD-10-CM | POA: Diagnosis not present

## 2021-09-07 DIAGNOSIS — R0602 Shortness of breath: Secondary | ICD-10-CM

## 2021-09-07 DIAGNOSIS — Z860100 Personal history of colon polyps, unspecified: Secondary | ICD-10-CM

## 2021-09-07 DIAGNOSIS — M25473 Effusion, unspecified ankle: Secondary | ICD-10-CM | POA: Diagnosis not present

## 2021-09-07 DIAGNOSIS — R053 Chronic cough: Secondary | ICD-10-CM | POA: Insufficient documentation

## 2021-09-07 DIAGNOSIS — B349 Viral infection, unspecified: Secondary | ICD-10-CM

## 2021-09-07 LAB — LIPID PANEL
Cholesterol: 215 mg/dL — ABNORMAL HIGH (ref <200)
HDL: 40 mg/dL — ABNORMAL LOW (ref >=50)
LDL Cholesterol (Calc): 148 mg/dL (calc) — ABNORMAL HIGH
Non-HDL Cholesterol (Calc): 175 mg/dL (calc) — ABNORMAL HIGH (ref <130)
Total CHOL/HDL Ratio: 5.4 (calc) — ABNORMAL HIGH (ref <5.0)
Triglycerides: 147 mg/dL (ref <150)

## 2021-09-07 LAB — HEPATIC FUNCTION PANEL
AG Ratio: 1.8 (calc) (ref 1.0–2.5)
ALT: 12 U/L (ref 6–29)
AST: 18 U/L (ref 10–35)
Albumin: 4.4 g/dL (ref 3.6–5.1)
Alkaline phosphatase (APISO): 61 U/L (ref 37–153)
Bilirubin, Direct: 0.2 mg/dL (ref 0.0–0.2)
Globulin: 2.4 g/dL (calc) (ref 1.9–3.7)
Indirect Bilirubin: 1.1 mg/dL (calc) (ref 0.2–1.2)
Total Bilirubin: 1.3 mg/dL — ABNORMAL HIGH (ref 0.2–1.2)
Total Protein: 6.8 g/dL (ref 6.1–8.1)

## 2021-09-07 LAB — BASIC METABOLIC PANEL
BUN: 12 mg/dL (ref 7–25)
CO2: 26 mmol/L (ref 20–32)
Calcium: 9.5 mg/dL (ref 8.6–10.4)
Chloride: 103 mmol/L (ref 98–110)
Creat: 0.73 mg/dL (ref 0.50–1.05)
Glucose, Bld: 84 mg/dL (ref 65–99)
Potassium: 4.2 mmol/L (ref 3.5–5.3)
Sodium: 139 mmol/L (ref 135–146)

## 2021-09-07 LAB — CBC WITH DIFFERENTIAL/PLATELET
Absolute Monocytes: 369 cells/uL (ref 200–950)
Basophils Absolute: 21 cells/uL (ref 0–200)
Basophils Relative: 0.4 %
Eosinophils Absolute: 21 cells/uL (ref 15–500)
Eosinophils Relative: 0.4 %
HCT: 40.7 % (ref 35.0–45.0)
Hemoglobin: 13.8 g/dL (ref 11.7–15.5)
Lymphs Abs: 1550 cells/uL (ref 850–3900)
MCH: 28.9 pg (ref 27.0–33.0)
MCHC: 33.9 g/dL (ref 32.0–36.0)
MCV: 85.3 fL (ref 80.0–100.0)
MPV: 10.2 fL (ref 7.5–12.5)
Monocytes Relative: 7.1 %
Neutro Abs: 3240 cells/uL (ref 1500–7800)
Neutrophils Relative %: 62.3 %
Platelets: 304 10*3/uL (ref 140–400)
RBC: 4.77 10*6/uL (ref 3.80–5.10)
RDW: 13 % (ref 11.0–15.0)
Total Lymphocyte: 29.8 %
WBC: 5.2 10*3/uL (ref 3.8–10.8)

## 2021-09-07 IMAGING — DX DG CHEST 2V
2 series · 2 of 2 positions shown · non-contrast
Comparison: Chest radiograph dated [DATE].

CLINICAL DATA: Cough and congestion.

EXAM:
CHEST - 2 VIEW

[chest pa]
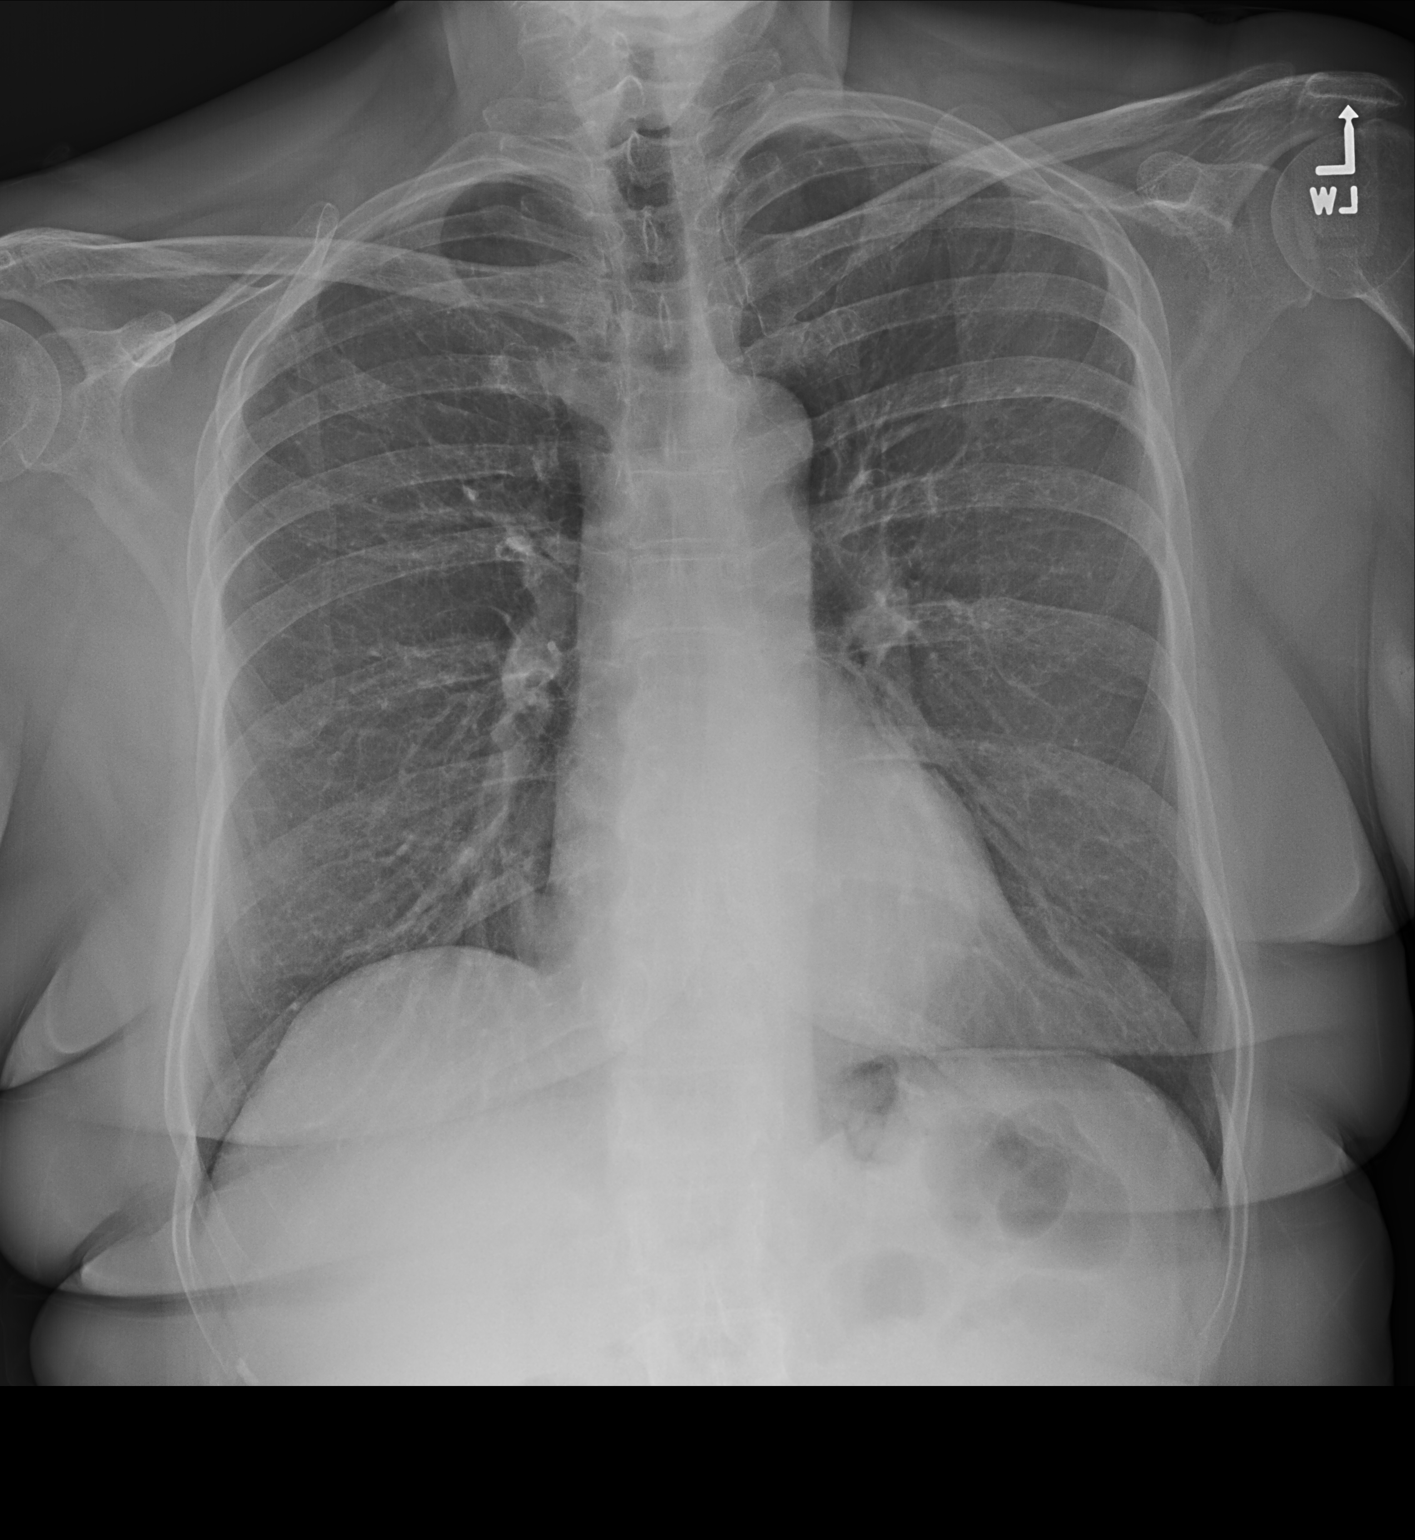

[chest lat]
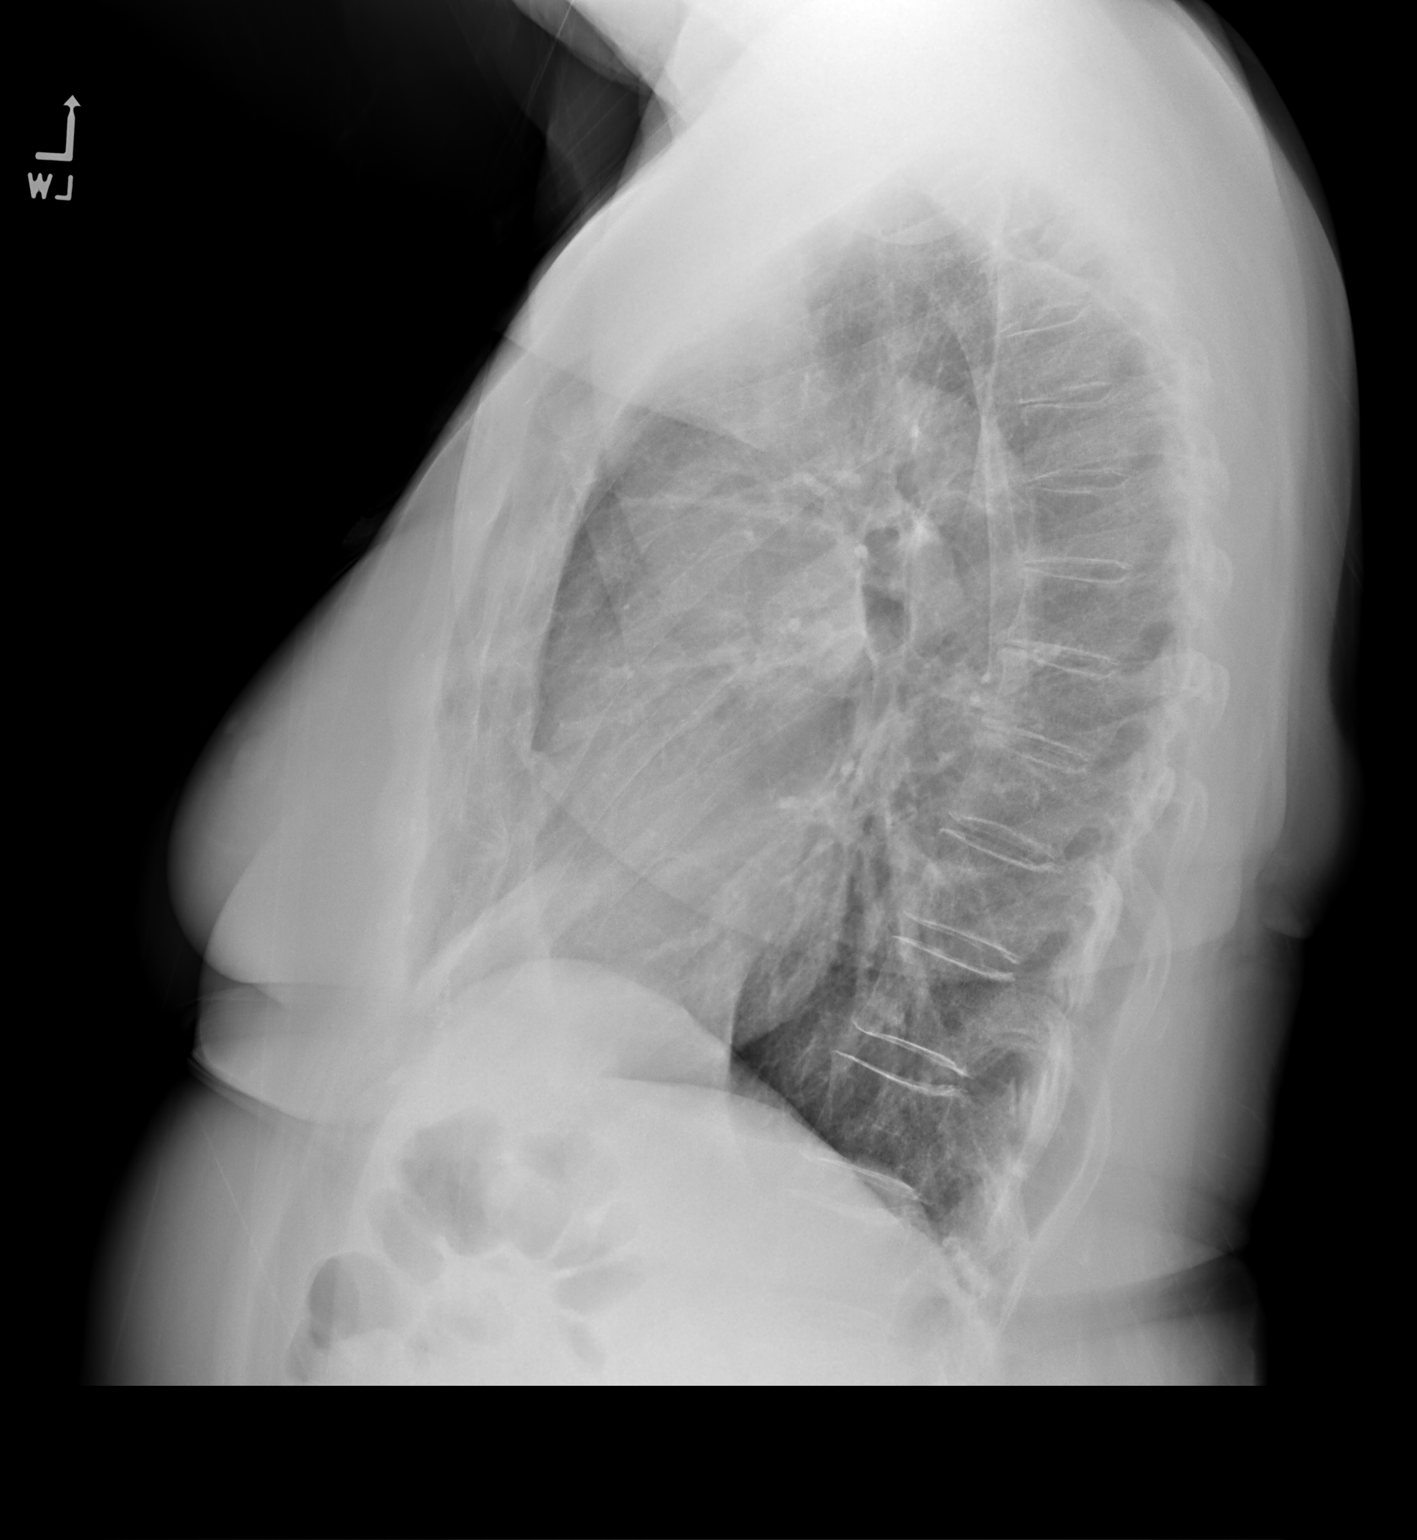

[2 of 2 positions shown; findings below may reference images not displayed]

FINDINGS: Mild chronic interstitial coarsening and bronchitic changes. No
focal consolidation, pleural effusion or pneumothorax. The cardiac
silhouette is within limits. No acute osseous pathology.
IMPRESSION: No active cardiopulmonary disease.

## 2021-09-07 NOTE — Progress Notes (Signed)
Patient ID: Lynn Mann, female   DOB: 1950/11/05, 71 y.o.   MRN: [id number]   Subjective:    Patient ID: Lynn Mann, female    DOB: 09-02-1950, 70 y.o.   MRN: [id number]  This visit occurred during the SARS-CoV-2 public health emergency.  Safety protocols were in place, including screening questions prior to the visit, additional usage of staff PPE, and extensive cleaning of exam room while observing appropriate contact time as indicated for disinfecting solutions.   Patient here for a scheduled follow up.   Chief Complaint  Patient presents with   Fatigue   hematoma    Follow up   Muscle Pain        fluid retention    ankles   .   HPI Previous fall 01/2021.  Saw ortho for persistent right shoulder pain.  Working with PT.  Some improvement.  States she is not able to do exercise at home yet.  Plans to start.  Leg hematoma - smaller.  Saw cardiology in 05/2021 - for f/u.  Previous stress echo - no ischemia.  EHO - normal EF.  Stable.  No changes made.  Several weeks ago, had aching, congestion,etc.  Feels better now. No cough or congestion reported.  Food does not taste as well.  Has been eating more fast food.  No vomiting.  Some fatigue.  No abdominal pain.  No bowel change reported.  Has noticed some intermittent swelling - ankles.     Past Medical History:  Diagnosis Date   Arthritis    C. difficile diarrhea    age 10s-40s    Chicken pox    Cholecystitis 11/2011   Did not require sgy - Dr. Staci Acosta - [facility]  (cholelithiasis)   H/O Clostridium difficile infection    IBS (irritable bowel syndrome)    MRSA exposure 2005   Spider bite   MVP (mitral valve prolapse)    Stable - Dr. Ubaldo Glassing   Rheumatic fever    Past Surgical History:  Procedure Laterality Date   CATARACT EXTRACTION  [id number]   MUSCLE BIOPSY     Family History  Problem Relation Age of Onset   Arthritis Mother    Stroke Mother    Diabetes Mother    Hypertension Mother    Arthritis Father    Stroke  Father    Diabetes Paternal Grandmother    Cancer Paternal Uncle        colon   Heart disease Other        maternal and paternal side   Breast cancer Paternal 39    Breast cancer Cousin    Breast cancer Cousin        female cousin   Breast cancer Cousin    Social History   Socioeconomic History   Marital status: Widowed    Spouse name: Not on file   Number of children: Not on file   Years of education: 16   Highest education level: Not on file  Occupational History   Occupation: Retired  Tobacco Use   Smoking status: Never   Smokeless tobacco: Never  Vaping Use   Vaping Use: Never used  Substance and Sexual Activity   Alcohol use: Yes    Alcohol/week: 0.0 standard drinks    Comment: Rarely   Drug use: No   Sexual activity: Yes    Partners: Male    Birth control/protection: Post-menopausal  Other Topics Concern   Not on file  Social History Narrative  Not on file   Social Determinants of Health   Financial Resource Strain: Low Risk    Difficulty of Paying Living Expenses: Not hard at all  Food Insecurity: No Food Insecurity   Worried About Charity fundraiser in the Last Year: Never true   [place] in the Last Year: Never true  Transportation Needs: No Transportation Needs   Lack of Transportation (Medical): No   Lack of Transportation (Non-Medical): No  Physical Activity: Insufficiently Active   Days of Exercise per Week: 2 days   Minutes of Exercise per Session: 50 min  Stress: No Stress Concern Present   Feeling of Stress : Not at all  Social Connections: Unknown   Frequency of Communication with Friends and Family: More than three times a week   Frequency of Social Gatherings with Friends and Family: More than three times a week   Attends Religious Services: Not on file   Active Member of Clubs or Organizations: Not on file   Attends Archivist Meetings: Not on file   Marital Status: Widowed     Review of Systems  Constitutional:   Positive for fatigue. Negative for unexpected weight change.       Eating, but food does not taste well - since sick. Previous covid tests negative.   HENT:  Negative for congestion and sinus pressure.   Respiratory:  Negative for cough, chest tightness and shortness of breath.   Cardiovascular:  Negative for chest pain and palpitations.       Some ankle swelling reported as outlined.   Gastrointestinal:  Negative for abdominal pain, diarrhea, nausea and vomiting.  Genitourinary:  Negative for difficulty urinating and dysuria.  Musculoskeletal:  Negative for joint swelling and myalgias.  Skin:  Negative for color change and rash.  Neurological:  Negative for dizziness, light-headedness and headaches.  Psychiatric/Behavioral:  Negative for agitation and dysphoric mood.       Objective:     BP 130/70    Pulse 90    Temp 97.8 F (36.6 C)    Resp 16    Ht 5\' 7"  (1.702 m)    Wt 185 lb 6.4 oz (84.1 kg)    SpO2 97%    BMI 29.04 kg/m  Wt Readings from Last 3 Encounters:  09/07/21 185 lb 6.4 oz (84.1 kg)  07/10/21 191 lb 6.4 oz (86.8 kg)  06/27/21 184 lb (83.5 kg)    Physical Exam Vitals reviewed.  Constitutional:      General: She is not in acute distress.    Appearance: Normal appearance.  HENT:     Head: Normocephalic and atraumatic.     Right Ear: External ear normal.     Left Ear: External ear normal.  Eyes:     General: No scleral icterus.       Right eye: No discharge.        Left eye: No discharge.     Conjunctiva/sclera: Conjunctivae normal.  Neck:     Thyroid: No thyromegaly.  Cardiovascular:     Rate and Rhythm: Normal rate and regular rhythm.  Pulmonary:     Effort: No respiratory distress.     Breath sounds: Normal breath sounds. No wheezing.  Abdominal:     General: Bowel sounds are normal.     Palpations: Abdomen is soft.     Tenderness: There is no abdominal tenderness.  Musculoskeletal:        General: No swelling or tenderness.  Cervical back: Neck  supple. No tenderness.  Lymphadenopathy:     Cervical: No cervical adenopathy.  Skin:    Findings: No erythema or rash.  Neurological:     Mental Status: She is alert.  Psychiatric:        Mood and Affect: Mood normal.        Behavior: Behavior normal.     Outpatient Encounter Medications as of 09/07/2021  Medication Sig   albuterol (VENTOLIN HFA) 108 (90 Base) MCG/ACT inhaler Inhale 2 puffs into the lungs every 6 (six) hours as needed for wheezing or shortness of breath.   aspirin 325 MG tablet Take by mouth.   b complex vitamins capsule Take 1 capsule by mouth daily.   CALCIUM PO Take by mouth.   Cholecalciferol (VITAMIN D3 PO) Take by mouth.   cyclobenzaprine (FLEXERIL) 5 MG tablet Take 1 tablet (5 mg total) by mouth 3 (three) times daily as needed for muscle spasms.   Multiple Vitamins-Minerals (PRESERVISION AREDS 2 PO) Take by mouth.   rosuvastatin (CRESTOR) 5 MG tablet Take 5 mg by mouth 2 (two) times a week.   No facility-administered encounter medications on file as of 09/07/2021.     Lab Results  Component Value Date   WBC 5.2 09/07/2021   HGB 13.8 09/07/2021   HCT 40.7 09/07/2021   PLT 304 09/07/2021   GLUCOSE 84 09/07/2021   CHOL 215 (H) 09/07/2021   TRIG 147 09/07/2021   HDL 40 (L) 09/07/2021   LDLDIRECT 110.0 02/11/2017   LDLCALC 148 (H) 09/07/2021   ALT 12 09/07/2021   AST 18 09/07/2021   NA 139 09/07/2021   K 4.2 09/07/2021   CL 103 09/07/2021   CREATININE 0.73 09/07/2021   BUN 12 09/07/2021   CO2 26 09/07/2021   TSH 1.96 06/06/2021   HGBA1C 5.4 07/15/2017    MM 3D SCREEN BREAST BILATERAL  Result Date: 06/11/2021 CLINICAL DATA:  Screening. EXAM: DIGITAL SCREENING BILATERAL MAMMOGRAM WITH TOMOSYNTHESIS AND CAD TECHNIQUE: Bilateral screening digital craniocaudal and mediolateral oblique mammograms were obtained. Bilateral screening digital breast tomosynthesis was performed. The images were evaluated with computer-aided detection. COMPARISON:  Previous  exam(s). ACR Breast Density Category b: There are scattered areas of fibroglandular density. FINDINGS: There are no findings suspicious for malignancy. IMPRESSION: No mammographic evidence of malignancy. A result letter of this screening mammogram will be mailed directly to the patient. RECOMMENDATION: Screening mammogram in one year. (Code:SM-B-01Y) BI-RADS CATEGORY  1: Negative. Electronically Signed   By: Dorise Bullion III M.D.   On: 06/11/2021 18:37      Assessment & Plan:   Problem List Items Addressed This Visit     Ankle swelling    No significant swelling noted on exam today.  DP pulses palpable and equal bilaterally.  Discussed leg elevation and compression hose.  Follow.       Asthma    Breathing stable.       Cough    No significant cough currently.  Still does not feel back to her baseline.  Recheck cxr.       Relevant Orders   DG Chest 2 View   Hematoma    Resolving.  Much improved.  Follow.       History of colonic polyps    Colonoscopy 08/2017 - tubular adenoma.  Recommended f/u colonoscopy in 5 years.       Hypercholesterolemia - Primary    The 10-year ASCVD risk score (Arnett DK, et al., 2019) is: 8.9%  Values used to calculate the score:     Age: 27 years     Sex: Female     Is Non-Hispanic African American: No     Diabetic: No     Tobacco smoker: No     Systolic Blood Pressure: 573 mmHg     Is BP treated: No     HDL Cholesterol: 40 mg/dL     Total Cholesterol: 215 mg/dL  Have discussed calculated cholesterol risk.  Have discussed recommendation for medication.  Has wanted to work on diet and exercise.  Follow lipid panel.       Relevant Orders   Basic metabolic panel (Completed)   Lipid panel (Completed)   Hepatic function panel (Completed)   CBC with Differential/Platelet (Completed)   Right shoulder pain    Has seen ortho.  Seeing PT now.  Has improved. Follow.       SOB (shortness of breath)    Previous sob.  Saw cardiology as outlined.   W/up as outlined - ECHO - normal LV function.  Stress echo - negative for ischemia.  No increased sob reported.  Follow.       Viral syndrome    Probable previous viral syndrome.  Feeling better.  No aching or cough now.  Some fatigue.  Check routine labs.  Still with decreased taste.  Follow.        I spent 45 minutes with the patient and more than 50% of the time was spent in consultation regarding the above.  Time spent discussing her current concerns and symptoms.  Time also spent discussing further w/up, evaluation and treatment.   Einar Pheasant, MD

## 2021-09-08 ENCOUNTER — Encounter: Payer: Self-pay | Admitting: Internal Medicine

## 2021-09-08 NOTE — Assessment & Plan Note (Signed)
Has seen ortho.  Seeing PT now.  Has improved. Follow.

## 2021-09-08 NOTE — Assessment & Plan Note (Signed)
No significant cough currently.  Still does not feel back to her baseline.  Recheck cxr.

## 2021-09-08 NOTE — Assessment & Plan Note (Signed)
Previous sob.  Saw cardiology as outlined.  W/up as outlined - ECHO - normal LV function.  Stress echo - negative for ischemia.  No increased sob reported.  Follow.  

## 2021-09-08 NOTE — Assessment & Plan Note (Signed)
Probable previous viral syndrome.  Feeling better.  No aching or cough now.  Some fatigue.  Check routine labs.  Still with decreased taste.  Follow.

## 2021-09-08 NOTE — Assessment & Plan Note (Signed)
No significant swelling noted on exam today.  DP pulses palpable and equal bilaterally.  Discussed leg elevation and compression hose.  Follow.

## 2021-09-08 NOTE — Assessment & Plan Note (Signed)
The 10-year ASCVD risk score (Arnett DK, et al., 2019) is: 8.9%   Values used to calculate the score:     Age: 71 years     Sex: Female     Is Non-Hispanic African American: No     Diabetic: No     Tobacco smoker: No     Systolic Blood Pressure: 193 mmHg     Is BP treated: No     HDL Cholesterol: 40 mg/dL     Total Cholesterol: 215 mg/dL  Have discussed calculated cholesterol risk.  Have discussed recommendation for medication.  Has wanted to work on diet and exercise.  Follow lipid panel.

## 2021-09-08 NOTE — Assessment & Plan Note (Signed)
Colonoscopy 08/2017 - tubular adenoma.  Recommended f/u colonoscopy in 5 years.  

## 2021-09-08 NOTE — Assessment & Plan Note (Signed)
Breathing stable.

## 2021-09-08 NOTE — Assessment & Plan Note (Signed)
Resolving.  Much improved.  Follow.

## 2021-09-10 ENCOUNTER — Ambulatory Visit: Payer: PPO

## 2021-09-10 ENCOUNTER — Other Ambulatory Visit: Payer: Self-pay

## 2021-09-10 DIAGNOSIS — G8929 Other chronic pain: Secondary | ICD-10-CM

## 2021-09-10 DIAGNOSIS — M25511 Pain in right shoulder: Secondary | ICD-10-CM | POA: Diagnosis not present

## 2021-09-10 DIAGNOSIS — M6281 Muscle weakness (generalized): Secondary | ICD-10-CM

## 2021-09-10 NOTE — Therapy (Signed)
[facility] [address] [place], [address] Phone: [phone]   Fax:  [phone]  Physical Therapy Treatment  Patient Details  Name: Lynn Mann MRN: [id number] Date of Birth: February 15, 1951 Referring Provider (PT): Rosalia Hammers, DO   Encounter Date: 09/10/2021   PT End of Session - 09/10/21 1420     Visit Number 24    Number of Visits 70    Date for PT Re-Evaluation 10/04/21    Authorization Time Period 10    PT Start Time 1420    PT Stop Time 1502    PT Time Calculation (min) 42 min    Activity Tolerance Patient tolerated treatment well    Behavior During Therapy [facility] for tasks assessed/performed             Past Medical History:  Diagnosis Date   Arthritis    C. difficile diarrhea    age 33s-40s    Chicken pox    Cholecystitis 11/2011   Did not require sgy - Dr. Staci Acosta - [facility]  (cholelithiasis)   H/O Clostridium difficile infection    IBS (irritable bowel syndrome)    MRSA exposure 2005   Spider bite   MVP (mitral valve prolapse)    Stable - Dr. Ubaldo Glassing   Rheumatic fever     Past Surgical History:  Procedure Laterality Date   CATARACT EXTRACTION  [id number]   MUSCLE BIOPSY      There were no vitals filed for this visit.   Subjective Assessment - 09/10/21 1421     Subjective Did not hurt this morning. Got a little sore when she got up and moved around. Bothered her last night when trying to push herself up from being on the floor doing chores. Feels like PT is helping her neck.    Pertinent History R shoulder did not start really hurting until about a month after her fall. Just started with a little pain at the [facility] joint and clavicle. Sometimes she also feels pain along her R pectoralis muscle in the shape of a triangle. Pt fell June 2022. Pt got up to go to the bathroom and tripped over a suitcase. Pt fell against the garment rack on her R side which kind of shielded her R side. Pt  also got a hematoma the size of a baked potato at her L leg afterwards. Pt hurt R toes during the fall which got better after 3 days. Went to urgent care for her leg. Hematoma has improved but still has a tiny knot in her L leg. Pt is R hand dominant. Pt was in PT around May to July 2022 for for her R hip which is better.    Patient Stated Goals Not hurt.    Currently in Pain? No/denies    Pain Onset More than a month ago                                        PT Education - 09/10/21 1611     Education Details ther-ex    Person(s) Educated Patient    Methods Explanation;Demonstration;Tactile cues;Verbal cues    Comprehension Returned demonstration;Verbalized understanding             There-ex:             Standing R shoulder AROM   Flexion 127 degrees flexoin  Reviewed POC: continue 4 more weeks.   Supine L cervical side bend stretch 30 seconds x 3  Supine cervical nod 10x3 with 2-3 second holds.   Seated manually resisted cervical flexion isometrics 8x5 seconds   Seated R shoulder AROM 4x.    Improved exercise technique, movement at target joints, use of target muscles after mod verbal, visual, tactile cues.                 Manual Therapy:   pt in hook lying:  STM R distal scalenes with L cervical side bend position  STM R pectoralis muscle just inferior to clavicle to decrease muscle tension   Seated STM R upper trap muscle to decrease tension    Response to treatment Fair tolerance to today's session.     Clinical impression Pt overall R clavicle pain level seems to remain at 8/10 at worst for the past 7 days which is 2 points better than her 10/10 at initial eval. Possible inflammatory involvement as well based on positive response to MD prescribed prednisone (last prescription around 06/13/2021) in which pt reported 6/10 at worst during the 06/25/2021 progress report with improved AROM. Pt also seems to respond positively with  treatment to decrease R sternocleidomastoid muscle tension based on previous 2 sessions. Challenges to progress include chronicity of condition. Pt will benefit from 4 more weeks continued skilled physical therapy services to decrease pain, improve AROM, strength and function.                            PT Short Term Goals - 07/18/21 1111       PT SHORT TERM GOAL #1   Title Pt will be independent with her initial HEP to decrease pain, improve strength, function, and ability to reach, push, and pull more comfortably.    Baseline Pt has started her HEP (05/22/2021); Pt performing her HEP. Has some questions but forgot what they are (06/25/2021), No questions with HEP, doing them (07/18/2021)    Time 3    Period Weeks    Status Achieved    Target Date 06/14/21               PT Long Term Goals - 09/10/21 1423       PT LONG TERM GOAL #1   Title Pt will have a decrease in R shoulder pain to 3/10 or less at worst to promote ability to reach, open and close the door, lift items more comfortably.    Baseline 10/10 R shoulder pain at worst for the past 2 months (05/22/2021); 6/10 at worst for the past 7 days (06/25/2021); 9-10/10 at worst for the past 7 days (07/18/2021); (07/24/2021); 08/20/21: Worst pain reported at 8/10 NPS.  8/10 at worst for the past 7 days (sudden and sharp ) 09/10/2021)    Time 4    Period Weeks    Status On-going    Target Date 10/04/21      PT LONG TERM GOAL #2   Title Pt will improve R shoulder flexion AROM to 150 degrees or more without pain to promote ability to reach.    Baseline 90 degrees R shoulder flexion AROM with pain (05/22/2021); 123 degrees flexion AROM, 147 degrees after resisted extension exercise (06/25/2021); 128 degrees flexion,, 97 degrees abduction  (07/18/2021); 36 degrees flexion (07/24/2021); 08/20/21: 135 degrees; 127 degrees flexion AROM (09/10/2021)    Time 4    Period Weeks  Status On-going    Target Date 10/04/21      PT LONG  TERM GOAL #3   Title Pt will improve her R shoulder strength all planes by at least 1/2 MMT grade to promote ability to reach as well as lift items.    Baseline R shoulder flexion 4/5, abduction 4-/5, ER 4+/5, IR 4/5 (05/22/2021); R shoulder flexion 4+/5, abduction 4/5, ER 4+/5, IR 4+/5 (06/25/2021); R shoulder flexion 4/5, abduction 4/5, ER 4/5, IR 5/5    Time 4    Period Weeks    Status On-going    Target Date 10/04/21      PT LONG TERM GOAL #4   Title Pt will improve R shoulder FOTO score by at least 10 points as a demonstration of improved function.    Baseline R shoulder FOTO 49 (05/22/2021); 59 (06/25/2021)    Time 8    Period Weeks    Status Achieved    Target Date 07/19/21                   Plan - 09/10/21 1611     Clinical Impression Statement Pt overall R clavicle pain level seems to remain at 8/10 at worst for the past 7 days which is 2 points better than her 10/10 at initial eval. Possible inflammatory involvement as well based on positive response to MD prescribed prednisone (last prescription around 06/13/2021) in which pt reported 6/10 at worst during the 06/25/2021 progress report with improved AROM. Pt also seems to respond positively with treatment to decrease R sternocleidomastoid muscle tension based on previous 2 sessions. Challenges to progress include chronicity of condition. Pt will benefit from 4 more weeks continued skilled physical therapy services to decrease pain, improve AROM, strength and function.    Personal Factors and Comorbidities Age;Comorbidity 2    Comorbidities Arthritis, Mitral valve prolapse    Examination-Activity Limitations Lift;Reach Overhead;Carry;Bed Mobility    Stability/Clinical Decision Making Evolving/Moderate complexity   inconsistent progress   Clinical Decision Making Moderate    Rehab Potential Fair    PT Frequency 2x / week    PT Duration 4 weeks    PT Treatment/Interventions Therapeutic activities;Therapeutic  exercise;Manual techniques;Electrical Stimulation;Iontophoresis 4mg /ml Dexamethasone;Functional mobility training;Neuromuscular re-education;Patient/family education;Dry needling    PT Next Visit Plan Decreasing sternocleidomastiod muscle tension, improve scapular strength    PT Home Exercise Plan Medbridge Access Code [email or web address]    Consulted and Agree with Plan of Care Patient             Patient will benefit from skilled therapeutic intervention in order to improve the following deficits and impairments:  Pain, Improper body mechanics, Postural dysfunction, Decreased strength  Visit Diagnosis: Chronic right shoulder pain - Plan: PT plan of care cert/re-cert  Muscle weakness (generalized) - Plan: PT plan of care cert/re-cert     Problem List Patient Active Problem List   Diagnosis Date Noted   Cough 09/07/2021   Right shoulder pain 07/15/2021   Hematoma 02/11/2021   Ankle swelling 11/26/2020   Low back pain 11/22/2020   Hip pain, right 11/22/2020   Light headedness 06/24/2020   Hearing loss 06/24/2020   Change in vision 06/24/2020   Stress 05/17/2020   Chest pain 05/16/2020   Welcome to Medicare preventive visit 06/20/2019   Viral syndrome 01/30/2019   Itching 07/21/2017   Asthma 07/16/2016   Urinary incontinence 07/16/2016   SOB (shortness of breath) 04/01/2016   Bilateral shoulder pain 02/24/2016   Fatigue 01/28/2016   Neck  fullness 01/28/2016   Routine general medical examination at a health care facility 07/25/2015   Health care maintenance 10/09/2014   Neck pain 11/26/2013   Hypercholesterolemia 11/26/2013   History of colonic polyps 05/11/2013   Hyperbilirubinemia 01/18/2013   GERD (gastroesophageal reflux disease) 12/03/2012   Cholelithiasis 11/07/2012   IBS (irritable bowel syndrome) 11/07/2012   History of rheumatic fever 08/28/2012   MVP (mitral valve prolapse) 08/28/2012   Joneen Boers PT, DPT  09/10/2021, 6:45 PM  [place] [facility] [address], [place], [address] Phone: [phone]   Fax:  [phone]  Name: Lynn Mann MRN: [id number] Date of Birth: January 12, 1951

## 2021-09-11 ENCOUNTER — Other Ambulatory Visit: Payer: Self-pay | Admitting: Internal Medicine

## 2021-09-11 DIAGNOSIS — R9389 Abnormal findings on diagnostic imaging of other specified body structures: Secondary | ICD-10-CM

## 2021-09-11 NOTE — Progress Notes (Signed)
Order placed for high resolution CT chest

## 2021-09-12 ENCOUNTER — Ambulatory Visit: Payer: PPO

## 2021-09-12 ENCOUNTER — Telehealth: Payer: Self-pay | Admitting: Internal Medicine

## 2021-09-12 ENCOUNTER — Other Ambulatory Visit: Payer: Self-pay

## 2021-09-12 DIAGNOSIS — M25511 Pain in right shoulder: Secondary | ICD-10-CM

## 2021-09-12 DIAGNOSIS — G8929 Other chronic pain: Secondary | ICD-10-CM

## 2021-09-12 DIAGNOSIS — M6281 Muscle weakness (generalized): Secondary | ICD-10-CM

## 2021-09-12 NOTE — Telephone Encounter (Signed)
Pt is having pain on the collar bone and pt wanted to know if the CT would show anything? Please advise and Thank you!  Call pt @ [phone].

## 2021-09-12 NOTE — Therapy (Signed)
[facility] [address] [place], [address] Phone: [phone]   Fax:  [phone]  Physical Therapy Treatment  Patient Details  Name: Lynn Mann MRN: [id number] Date of Birth: [birth year] Referring Provider (PT): Rosalia Hammers, DO   Encounter Date: 09/12/2021   PT End of Session - 09/12/21 1420     Visit Number 25    Number of Visits 64    Date for PT Re-Evaluation 10/04/21    Authorization Time Period 10    PT Start Time 1420    PT Stop Time 1459    PT Time Calculation (min) 39 min    Activity Tolerance Patient tolerated treatment well    Behavior During Therapy [facility] for tasks assessed/performed             Past Medical History:  Diagnosis Date   Arthritis    C. difficile diarrhea    age 59s-40s    Chicken pox    Cholecystitis 11/2011   Did not require sgy - Dr. Staci Acosta - [facility]  (cholelithiasis)   H/O Clostridium difficile infection    IBS (irritable bowel syndrome)    MRSA exposure 2005   Spider bite   MVP (mitral valve prolapse)    Stable - Dr. Ubaldo Glassing   Rheumatic fever     Past Surgical History:  Procedure Laterality Date   CATARACT EXTRACTION  [id number]   MUSCLE BIOPSY      There were no vitals filed for this visit.   Subjective Assessment - 09/12/21 1421     Subjective Had 16/10 pain this morning. Pt was laying on her L side this morning and her L arm moved back a little. Ice pack helped.    Pertinent History R shoulder did not start really hurting until about a month after her fall. Just started with a little pain at the [facility] joint and clavicle. Sometimes she also feels pain along her R pectoralis muscle in the shape of a triangle. Pt fell June 2022. Pt got up to go to the bathroom and tripped over a suitcase. Pt fell against the garment rack on her R side which kind of shielded her R side. Pt also got a hematoma the size of a baked potato at her L leg afterwards. Pt hurt R toes  during the fall which got better after 3 days. Went to urgent care for her leg. Hematoma has improved but still has a tiny knot in her L leg. Pt is R hand dominant. Pt was in PT around May to July 2022 for for her R hip which is better.    Patient Stated Goals Not hurt.    Currently in Pain? Yes    Pain Score 2    Dull ache at rest.   Pain Onset More than a month ago                                        PT Education - 09/12/21 1458     Education Details ther-ex    Person(s) Educated Patient    Methods Explanation;Demonstration;Tactile cues;Verbal cues    Comprehension Returned demonstration;Verbalized understanding             There-ex:             Seated R cervical rotation 10x5 seconds (to decrease R SCM muscle activation) for 2  sets  Seated chin tucks 5x5 seconds   Seated R sternocleidomastoid stretch 10x2 with 5 second holds   Seated R shoulder extension isometrics, PT manually resisted in neutral 10x5 seconds   Seated R shoulder flexion AAROM with resisted return to extension/neutral 10x  Cues for scapular retraction to decrease protraction at sternoclavicular joint. Improved ease of shoulder flexion observed  Seated manually resisted scapular retraction   R 10x5 seconds   Seated manually resisted R shoulder abduction isometrics in neutral 10x5 seconds      Improved exercise technique, movement at target joints, use of target muscles after mod verbal, visual, tactile cues.                 Manual Therapy:   Seated STM R distal scalene area, upper trap, and R posterior cervical paraspinal muscle to decrease tension  Seated very gentle STM R sternocleidomastoid muscle to help decrease tension.      Response to treatment Fair tolerance to today's session.        Clinical impression Continued working in decreasing R SCM muscle activation as well as decreasing muscle tension posterior neck and R upper trap area to help decrease  stress to affected tissues. Slight decrease in R clavicular symptoms with decreasing SCM acitvation. Challenges to progress include multiple areas of pain (neck, clavicle, [place] joint, shoulder joint) and chronicity of condition. Pt will benefit from continued skilled physical therapy services to help promote function.    PT Short Term Goals - 07/18/21 1111       PT SHORT TERM GOAL #1   Title Pt will be independent with her initial HEP to decrease pain, improve strength, function, and ability to reach, push, and pull more comfortably.    Baseline Pt has started her HEP (05/22/2021); Pt performing her HEP. Has some questions but forgot what they are (06/25/2021), No questions with HEP, doing them (07/18/2021)    Time 3    Period Weeks    Status Achieved    Target Date 06/14/21               PT Long Term Goals - 09/10/21 1423       PT LONG TERM GOAL #1   Title Pt will have a decrease in R shoulder pain to 3/10 or less at worst to promote ability to reach, open and close the door, lift items more comfortably.    Baseline 10/10 R shoulder pain at worst for the past 2 months (05/22/2021); 6/10 at worst for the past 7 days (06/25/2021); 9-10/10 at worst for the past 7 days (07/18/2021); (07/24/2021); 08/20/21: Worst pain reported at 8/10 NPS.  8/10 at worst for the past 7 days (sudden and sharp ) 09/10/2021)    Time 4    Period Weeks    Status On-going    Target Date 10/04/21      PT LONG TERM GOAL #2   Title Pt will improve R shoulder flexion AROM to 150 degrees or more without pain to promote ability to reach.    Baseline 90 degrees R shoulder flexion AROM with pain (05/22/2021); 123 degrees flexion AROM, 147 degrees after resisted extension exercise (06/25/2021); 128 degrees flexion,, 97 degrees abduction  (07/18/2021); 36 degrees flexion (07/24/2021); 08/20/21: 135 degrees; 127 degrees flexion AROM (09/10/2021)    Time 4    Period Weeks    Status On-going    Target Date 10/04/21      PT  LONG TERM GOAL #3   Title Pt  will improve her R shoulder strength all planes by at least 1/2 MMT grade to promote ability to reach as well as lift items.    Baseline R shoulder flexion 4/5, abduction 4-/5, ER 4+/5, IR 4/5 (05/22/2021); R shoulder flexion 4+/5, abduction 4/5, ER 4+/5, IR 4+/5 (06/25/2021); R shoulder flexion 4/5, abduction 4/5, ER 4/5, IR 5/5    Time 4    Period Weeks    Status On-going    Target Date 10/04/21      PT LONG TERM GOAL #4   Title Pt will improve R shoulder FOTO score by at least 10 points as a demonstration of improved function.    Baseline R shoulder FOTO 49 (05/22/2021); 59 (06/25/2021)    Time 8    Period Weeks    Status Achieved    Target Date 07/19/21                   Plan - 09/12/21 1741     Clinical Impression Statement Continued working in decreasing R SCM muscle activation as well as decreasing muscle tension posterior neck and R upper trap area to help decrease stress to affected tissues. Slight decrease in R clavicular symptoms with decreasing SCM acitvation. Challenges to progress include multiple areas of pain (neck, clavicle, [place] joint, shoulder joint) and chronicity of condition. Pt will benefit from continued skilled physical therapy services to help promote function.    Personal Factors and Comorbidities Age;Comorbidity 2    Comorbidities Arthritis, Mitral valve prolapse    Examination-Activity Limitations Lift;Reach Overhead;Carry;Bed Mobility    Stability/Clinical Decision Making Evolving/Moderate complexity   inconsistent progress   Rehab Potential Fair    PT Frequency 2x / week    PT Duration 4 weeks    PT Treatment/Interventions Therapeutic activities;Therapeutic exercise;Manual techniques;Electrical Stimulation;Iontophoresis 4mg /ml Dexamethasone;Functional mobility training;Neuromuscular re-education;Patient/family education;Dry needling    PT Next Visit Plan Decreasing sternocleidomastiod muscle tension, improve scapular  strength    PT Home Exercise Plan Medbridge Access Code [email or web address]    Consulted and Agree with Plan of Care Patient             Patient will benefit from skilled therapeutic intervention in order to improve the following deficits and impairments:  Pain, Improper body mechanics, Postural dysfunction, Decreased strength  Visit Diagnosis: Chronic right shoulder pain  Muscle weakness (generalized)     Problem List Patient Active Problem List   Diagnosis Date Noted   Cough 09/07/2021   Right shoulder pain 07/15/2021   Hematoma 02/11/2021   Ankle swelling 11/26/2020   Low back pain 11/22/2020   Hip pain, right 11/22/2020   Light headedness 06/24/2020   Hearing loss 06/24/2020   Change in vision 06/24/2020   Stress 05/17/2020   Chest pain 05/16/2020   Welcome to Medicare preventive visit 06/20/2019   Viral syndrome 01/30/2019   Itching 07/21/2017   Asthma 07/16/2016   Urinary incontinence 07/16/2016   SOB (shortness of breath) 04/01/2016   Bilateral shoulder pain 02/24/2016   Fatigue 01/28/2016   Neck fullness 01/28/2016   Routine general medical examination at a health care facility 07/25/2015   Health care maintenance 10/09/2014   Neck pain 11/26/2013   Hypercholesterolemia 11/26/2013   History of colonic polyps 05/11/2013   Hyperbilirubinemia 01/18/2013   GERD (gastroesophageal reflux disease) 12/03/2012   Cholelithiasis 11/07/2012   IBS (irritable bowel syndrome) 11/07/2012   History of rheumatic fever 08/28/2012   MVP (mitral valve prolapse) 08/28/2012   Joneen Boers PT, DPT  09/12/2021, 5:48 PM  [facility] [address], [place], [address] Phone: [phone]   Fax:  [phone]  Name: CHIYO FAY MRN: [id number] Date of Birth: 04/19/1951

## 2021-09-13 NOTE — Telephone Encounter (Signed)
Will her chest ct show her collar bone area? She did not mention this at her appt with you

## 2021-09-13 NOTE — Telephone Encounter (Signed)
The CT scan scans the whole chest area.  We will start with a chest CT - given I want to get a better look at the lungs.  If persistent pain, can xray or other scan if needed

## 2021-09-13 NOTE — Telephone Encounter (Signed)
Patient is aware of below. 

## 2021-09-14 NOTE — Telephone Encounter (Signed)
Is it a go for me to sch pt? Please advise and thank you!

## 2021-09-14 NOTE — Telephone Encounter (Signed)
Yes please

## 2021-09-17 ENCOUNTER — Ambulatory Visit: Payer: PPO

## 2021-09-17 ENCOUNTER — Other Ambulatory Visit: Payer: Self-pay

## 2021-09-17 DIAGNOSIS — G8929 Other chronic pain: Secondary | ICD-10-CM

## 2021-09-17 DIAGNOSIS — M25511 Pain in right shoulder: Secondary | ICD-10-CM | POA: Diagnosis not present

## 2021-09-17 DIAGNOSIS — M6281 Muscle weakness (generalized): Secondary | ICD-10-CM

## 2021-09-17 NOTE — Telephone Encounter (Signed)
Pt called and said [employer] will cover ct scan

## 2021-09-17 NOTE — Therapy (Signed)
[facility] [address], [place], [address] Phone: [phone]   Fax:  [phone]  Physical Therapy Treatment  Patient Details  Name: Lynn Mann MRN: [id number] Date of Birth: 1951/01/20 Referring Provider (PT): Rosalia Hammers, DO   Encounter Date: 09/17/2021   PT End of Session - 09/17/21 1420     Visit Number 26    Number of Visits 68    Date for PT Re-Evaluation 10/04/21    Authorization Time Period 10    PT Start Time 1420    PT Stop Time 1449    PT Time Calculation (min) 29 min    Activity Tolerance Patient tolerated treatment well    Behavior During Therapy [facility] for tasks assessed/performed             Past Medical History:  Diagnosis Date   Arthritis    C. difficile diarrhea    age 20s-40s    Chicken pox    Cholecystitis 11/2011   Did not require sgy - Dr. Staci Acosta - [facility]  (cholelithiasis)   H/O Clostridium difficile infection    IBS (irritable bowel syndrome)    MRSA exposure 2005   Spider bite   MVP (mitral valve prolapse)    Stable - Dr. Ubaldo Glassing   Rheumatic fever     Past Surgical History:  Procedure Laterality Date   CATARACT EXTRACTION  [id number]   MUSCLE BIOPSY      There were no vitals filed for this visit.   Subjective Assessment - 09/17/21 1420     Subjective R clavicle was not too uncomfoetable this morning.  0/10 currently at rest, 1.5/10 when she moves her arm.  Could not find her muscle relaxers.    Pertinent History R shoulder did not start really hurting until about a month after her fall. Just started with a little pain at the [facility] joint and clavicle. Sometimes she also feels pain along her R pectoralis muscle in the shape of a triangle. Pt fell June 2022. Pt got up to go to the bathroom and tripped over a suitcase. Pt fell against the garment rack on her R side which kind of shielded her R side. Pt also got a hematoma the size of a baked potato at her L leg  afterwards. Pt hurt R toes during the fall which got better after 3 days. Went to urgent care for her leg. Hematoma has improved but still has a tiny knot in her L leg. Pt is R hand dominant. Pt was in PT around May to July 2022 for for her R hip which is better.    Patient Stated Goals Not hurt.    Currently in Pain? Yes    Pain Score 2     Pain Onset More than a month ago                                        PT Education - 09/17/21 1434     Education Details ther-ex    Person(s) Educated Patient    Methods Explanation;Demonstration;Tactile cues;Verbal cues    Comprehension Returned demonstration;Verbalized understanding           There-ex:             Standing B scapular retraction yellow band 10x5 seconds for 3 sets  Chin tucks 10x5 seconds for 3 sets  Seated B shoulder extension yellow band 10x3  Seated R shoulder assisted flexion to end range with PT manual resistance to extension 10x3  134 degrees R shoulder flexion AROM afterwards   Seated manually resisted R shoulder ER isometrics in neutral 10x5 seconds for 2 sets       Improved exercise technique, movement at target joints, use of target muscles after mod verbal, visual, tactile cues.                  Response to treatment Pt tolerated session well without aggravation of symptoms.          Clinical impression R proximal clavicle at sternoclavicular joint not as prominent observed and palpated compared to previous sessions. Improved R shoulder flexion AROM to 134 degrees today. Continued working on scapular and posterior shoulder strengthening to promote better mechanics R shoulder girdle. Pt tolerated session well without aggravation of symptoms. Pt will benefit from continued skilled physical therapy services to decrease pain, improve strength and function.     PT Short Term Goals - 07/18/21 1111       PT SHORT TERM GOAL #1   Title Pt will be independent with her initial  HEP to decrease pain, improve strength, function, and ability to reach, push, and pull more comfortably.    Baseline Pt has started her HEP (05/22/2021); Pt performing her HEP. Has some questions but forgot what they are (06/25/2021), No questions with HEP, doing them (07/18/2021)    Time 3    Period Weeks    Status Achieved    Target Date 06/14/21               PT Long Term Goals - 09/10/21 1423       PT LONG TERM GOAL #1   Title Pt will have a decrease in R shoulder pain to 3/10 or less at worst to promote ability to reach, open and close the door, lift items more comfortably.    Baseline 10/10 R shoulder pain at worst for the past 2 months (05/22/2021); 6/10 at worst for the past 7 days (06/25/2021); 9-10/10 at worst for the past 7 days (07/18/2021); (07/24/2021); 08/20/21: Worst pain reported at 8/10 NPS.  8/10 at worst for the past 7 days (sudden and sharp ) 09/10/2021)    Time 4    Period Weeks    Status On-going    Target Date 10/04/21      PT LONG TERM GOAL #2   Title Pt will improve R shoulder flexion AROM to 150 degrees or more without pain to promote ability to reach.    Baseline 90 degrees R shoulder flexion AROM with pain (05/22/2021); 123 degrees flexion AROM, 147 degrees after resisted extension exercise (06/25/2021); 128 degrees flexion,, 97 degrees abduction  (07/18/2021); 36 degrees flexion (07/24/2021); 08/20/21: 135 degrees; 127 degrees flexion AROM (09/10/2021)    Time 4    Period Weeks    Status On-going    Target Date 10/04/21      PT LONG TERM GOAL #3   Title Pt will improve her R shoulder strength all planes by at least 1/2 MMT grade to promote ability to reach as well as lift items.    Baseline R shoulder flexion 4/5, abduction 4-/5, ER 4+/5, IR 4/5 (05/22/2021); R shoulder flexion 4+/5, abduction 4/5, ER 4+/5, IR 4+/5 (06/25/2021); R shoulder flexion 4/5, abduction 4/5, ER 4/5, IR 5/5    Time 4    Period Weeks    Status On-going  Target Date 10/04/21       PT LONG TERM GOAL #4   Title Pt will improve R shoulder FOTO score by at least 10 points as a demonstration of improved function.    Baseline R shoulder FOTO 49 (05/22/2021); 59 (06/25/2021)    Time 8    Period Weeks    Status Achieved    Target Date 07/19/21                   Plan - 09/17/21 1443     Clinical Impression Statement R proximal clavicle at sternoclavicular joint not as prominent observed and palpated compared to previous sessions. Improved R shoulder flexion AROM to 134 degrees today. Continued working on scapular and posterior shoulder strengthening to promote better mechanics R shoulder girdle. Pt tolerated session well without aggravation of symptoms. Pt will benefit from continued skilled physical therapy services to decrease pain, improve strength and function.    Personal Factors and Comorbidities Age;Comorbidity 2    Comorbidities Arthritis, Mitral valve prolapse    Examination-Activity Limitations Lift;Reach Overhead;Carry;Bed Mobility    Stability/Clinical Decision Making Evolving/Moderate complexity   inconsistent progress   Rehab Potential Fair    PT Frequency 2x / week    PT Duration 4 weeks    PT Treatment/Interventions Therapeutic activities;Therapeutic exercise;Manual techniques;Electrical Stimulation;Iontophoresis 4mg /ml Dexamethasone;Functional mobility training;Neuromuscular re-education;Patient/family education;Dry needling    PT Next Visit Plan Decreasing sternocleidomastiod muscle tension, improve scapular strength    PT Home Exercise Plan Medbridge Access Code [email or web address]    Consulted and Agree with Plan of Care Patient             Patient will benefit from skilled therapeutic intervention in order to improve the following deficits and impairments:  Pain, Improper body mechanics, Postural dysfunction, Decreased strength  Visit Diagnosis: Chronic right shoulder pain  Muscle weakness (generalized)     Problem List Patient Active  Problem List   Diagnosis Date Noted   Cough 09/07/2021   Right shoulder pain 07/15/2021   Hematoma 02/11/2021   Ankle swelling 11/26/2020   Low back pain 11/22/2020   Hip pain, right 11/22/2020   Light headedness 06/24/2020   Hearing loss 06/24/2020   Change in vision 06/24/2020   Stress 05/17/2020   Chest pain 05/16/2020   Welcome to Medicare preventive visit 06/20/2019   Viral syndrome 01/30/2019   Itching 07/21/2017   Asthma 07/16/2016   Urinary incontinence 07/16/2016   SOB (shortness of breath) 04/01/2016   Bilateral shoulder pain 02/24/2016   Fatigue 01/28/2016   Neck fullness 01/28/2016   Routine general medical examination at a health care facility 07/25/2015   Health care maintenance 10/09/2014   Neck pain 11/26/2013   Hypercholesterolemia 11/26/2013   History of colonic polyps 05/11/2013   Hyperbilirubinemia 01/18/2013   GERD (gastroesophageal reflux disease) 12/03/2012   Cholelithiasis 11/07/2012   IBS (irritable bowel syndrome) 11/07/2012   History of rheumatic fever 08/28/2012   MVP (mitral valve prolapse) 08/28/2012   Joneen Boers PT, DPT  09/17/2021, 9:01 PM  [facility] [address], [place], [address] Phone: [phone]   Fax:  [phone]  Name: Lynn Mann MRN: [id number] Date of Birth: 1950/08/27

## 2021-09-19 ENCOUNTER — Other Ambulatory Visit: Payer: Self-pay

## 2021-09-19 ENCOUNTER — Telehealth: Payer: Self-pay | Admitting: Internal Medicine

## 2021-09-19 ENCOUNTER — Ambulatory Visit: Payer: PPO

## 2021-09-19 DIAGNOSIS — M6281 Muscle weakness (generalized): Secondary | ICD-10-CM

## 2021-09-19 DIAGNOSIS — M25511 Pain in right shoulder: Secondary | ICD-10-CM | POA: Diagnosis not present

## 2021-09-19 DIAGNOSIS — G8929 Other chronic pain: Secondary | ICD-10-CM

## 2021-09-19 NOTE — Therapy (Signed)
[facility] [address], [place], [address] Phone: [phone]   Fax:  [phone]  Physical Therapy Treatment  Patient Details  Name: Lynn Mann MRN: [id number] Date of Birth: Feb 08, 1951 Referring Provider (PT): Rosalia Hammers, DO   Encounter Date: 09/19/2021   PT End of Session - 09/19/21 1426     Visit Number 27    Number of Visits 2    Date for PT Re-Evaluation 10/04/21    Authorization Time Period 10    PT Start Time 29   Pt arrived late   PT Stop Time 22    PT Time Calculation (min) 33 min    Activity Tolerance Patient tolerated treatment well    Behavior During Therapy [facility] for tasks assessed/performed             Past Medical History:  Diagnosis Date   Arthritis    C. difficile diarrhea    age 55s-40s    Chicken pox    Cholecystitis 11/2011   Did not require sgy - Dr. Staci Acosta - [facility]  (cholelithiasis)   H/O Clostridium difficile infection    IBS (irritable bowel syndrome)    MRSA exposure 2005   Spider bite   MVP (mitral valve prolapse)    Stable - Dr. Ubaldo Glassing   Rheumatic fever     Past Surgical History:  Procedure Laterality Date   CATARACT EXTRACTION  [id number]   MUSCLE BIOPSY      There were no vitals filed for this visit.   Subjective Assessment - 09/19/21 1427     Subjective R collar bone did not hurt but had R pectoralis pain, multiple circles of pain this morning. R collar bone soreness currently. Also feels discomfort along the R sternocostal joints this morning.    Pertinent History R shoulder did not start really hurting until about a month after her fall. Just started with a little pain at the [facility] joint and clavicle. Sometimes she also feels pain along her R pectoralis muscle in the shape of a triangle. Pt fell June 2022. Pt got up to go to the bathroom and tripped over a suitcase. Pt fell against the garment rack on her R side which kind of shielded her R side.  Pt also got a hematoma the size of a baked potato at her L leg afterwards. Pt hurt R toes during the fall which got better after 3 days. Went to urgent care for her leg. Hematoma has improved but still has a tiny knot in her L leg. Pt is R hand dominant. Pt was in PT around May to July 2022 for for her R hip which is better.    Patient Stated Goals Not hurt.    Currently in Pain? Yes    Pain Score 1    0.5/10 R clavicle soreness   Pain Onset More than a month ago                                        PT Education - 09/19/21 1430     Education Details ther-ex    Person(s) Educated Patient    Methods Explanation;Demonstration;Tactile cues;Verbal cues    Comprehension Returned demonstration;Verbalized understanding            Manual therapy  Seated STM R > L cervical paraspinal and upper trap muscles to decrease muscle  tension    There-ex:             Chin tucks 10x5 seconds for 3 sets  Standing B shoulder extension yellow band 10x3  Standing B scapular retraction yellow band 10x5 seconds for 3 sets   Seated R shoulder assisted flexion to end range with PT manual resistance to extension 10x3             Improved exercise technique, movement at target joints, use of target muscles after mod verbal, visual, tactile cues.                   Response to treatment Pt tolerated session well without aggravation of symptoms.          Clinical impression Pt arrived late so session was adjusted accordingly.  Improved ability to perform shoulder flexion after resisted shoulder extension. Continued with scapular strengthening to promote proper R shoulder girdle mechanics when raising her arm. Pt tolerated session well without aggravation of symptoms. Pt will benefit from continued skilled physical therapy services to decrease pain, improve strength and function.         PT Short Term Goals - 07/18/21 1111       PT SHORT TERM GOAL #1   Title Pt  will be independent with her initial HEP to decrease pain, improve strength, function, and ability to reach, push, and pull more comfortably.    Baseline Pt has started her HEP (05/22/2021); Pt performing her HEP. Has some questions but forgot what they are (06/25/2021), No questions with HEP, doing them (07/18/2021)    Time 3    Period Weeks    Status Achieved    Target Date 06/14/21               PT Long Term Goals - 09/10/21 1423       PT LONG TERM GOAL #1   Title Pt will have a decrease in R shoulder pain to 3/10 or less at worst to promote ability to reach, open and close the door, lift items more comfortably.    Baseline 10/10 R shoulder pain at worst for the past 2 months (05/22/2021); 6/10 at worst for the past 7 days (06/25/2021); 9-10/10 at worst for the past 7 days (07/18/2021); (07/24/2021); 08/20/21: Worst pain reported at 8/10 NPS.  8/10 at worst for the past 7 days (sudden and sharp ) 09/10/2021)    Time 4    Period Weeks    Status On-going    Target Date 10/04/21      PT LONG TERM GOAL #2   Title Pt will improve R shoulder flexion AROM to 150 degrees or more without pain to promote ability to reach.    Baseline 90 degrees R shoulder flexion AROM with pain (05/22/2021); 123 degrees flexion AROM, 147 degrees after resisted extension exercise (06/25/2021); 128 degrees flexion,, 97 degrees abduction  (07/18/2021); 36 degrees flexion (07/24/2021); 08/20/21: 135 degrees; 127 degrees flexion AROM (09/10/2021)    Time 4    Period Weeks    Status On-going    Target Date 10/04/21      PT LONG TERM GOAL #3   Title Pt will improve her R shoulder strength all planes by at least 1/2 MMT grade to promote ability to reach as well as lift items.    Baseline R shoulder flexion 4/5, abduction 4-/5, ER 4+/5, IR 4/5 (05/22/2021); R shoulder flexion 4+/5, abduction 4/5, ER 4+/5, IR 4+/5 (06/25/2021); R shoulder flexion 4/5, abduction 4/5,  ER 4/5, IR 5/5    Time 4    Period Weeks    Status  On-going    Target Date 10/04/21      PT LONG TERM GOAL #4   Title Pt will improve R shoulder FOTO score by at least 10 points as a demonstration of improved function.    Baseline R shoulder FOTO 49 (05/22/2021); 59 (06/25/2021)    Time 8    Period Weeks    Status Achieved    Target Date 07/19/21                   Plan - 09/19/21 1425     Clinical Impression Statement Pt arrived late so session was adjusted accordingly.  Improved ability to perform shoulder flexion after resisted shoulder extension. Continued with scapular strengthening to promote proper R shoulder girdle mechanics when raising her arm. Pt tolerated session well without aggravation of symptoms. Pt will benefit from continued skilled physical therapy services to decrease pain, improve strength and function.    Personal Factors and Comorbidities Age;Comorbidity 2    Comorbidities Arthritis, Mitral valve prolapse    Examination-Activity Limitations Lift;Reach Overhead;Carry;Bed Mobility    Stability/Clinical Decision Making Evolving/Moderate complexity   inconsistent progress   Rehab Potential Fair    PT Frequency 2x / week    PT Duration 4 weeks    PT Treatment/Interventions Therapeutic activities;Therapeutic exercise;Manual techniques;Electrical Stimulation;Iontophoresis 4mg /ml Dexamethasone;Functional mobility training;Neuromuscular re-education;Patient/family education;Dry needling    PT Next Visit Plan Decreasing sternocleidomastiod muscle tension, improve scapular strength    PT Home Exercise Plan Medbridge Access Code [email or web address]    Consulted and Agree with Plan of Care Patient             Patient will benefit from skilled therapeutic intervention in order to improve the following deficits and impairments:  Pain, Improper body mechanics, Postural dysfunction, Decreased strength  Visit Diagnosis: Chronic right shoulder pain  Muscle weakness (generalized)     Problem List Patient Active Problem  List   Diagnosis Date Noted   Cough 09/07/2021   Right shoulder pain 07/15/2021   Hematoma 02/11/2021   Ankle swelling 11/26/2020   Low back pain 11/22/2020   Hip pain, right 11/22/2020   Light headedness 06/24/2020   Hearing loss 06/24/2020   Change in vision 06/24/2020   Stress 05/17/2020   Chest pain 05/16/2020   Welcome to Medicare preventive visit 06/20/2019   Viral syndrome 01/30/2019   Itching 07/21/2017   Asthma 07/16/2016   Urinary incontinence 07/16/2016   SOB (shortness of breath) 04/01/2016   Bilateral shoulder pain 02/24/2016   Fatigue 01/28/2016   Neck fullness 01/28/2016   Routine general medical examination at a health care facility 07/25/2015   Health care maintenance 10/09/2014   Neck pain 11/26/2013   Hypercholesterolemia 11/26/2013   History of colonic polyps 05/11/2013   Hyperbilirubinemia 01/18/2013   GERD (gastroesophageal reflux disease) 12/03/2012   Cholelithiasis 11/07/2012   IBS (irritable bowel syndrome) 11/07/2012   History of rheumatic fever 08/28/2012   MVP (mitral valve prolapse) 08/28/2012    Joneen Boers PT, DPT  09/19/2021, 3:59 PM  [facility] [address] [place], [address] Phone: [phone]   Fax:  [phone]  Name: RENNEE COYNE MRN: [id number] Date of Birth: 1951/04/05

## 2021-09-19 NOTE — Telephone Encounter (Signed)
Pt called back I left pt vm to call ofc regarding scan. thanks

## 2021-09-24 ENCOUNTER — Other Ambulatory Visit: Payer: Self-pay

## 2021-09-24 ENCOUNTER — Ambulatory Visit: Payer: PPO

## 2021-09-24 DIAGNOSIS — M25511 Pain in right shoulder: Secondary | ICD-10-CM | POA: Diagnosis not present

## 2021-09-24 DIAGNOSIS — M6281 Muscle weakness (generalized): Secondary | ICD-10-CM

## 2021-09-24 DIAGNOSIS — G8929 Other chronic pain: Secondary | ICD-10-CM

## 2021-09-24 NOTE — Patient Instructions (Signed)
Access Code: [email or web address] URL: [email or web address] Date: 09/24/2021 Prepared by: Joneen Boers  Exercises Seated Scapular Retraction - 1 x daily - 7 x weekly - 3 sets - 10 reps - 5-15 seconds hold Sternocleidomastoid Stretch - 2 x daily - 7 x weekly - 1 sets - 5 reps - 10 seconds hold Isometric Shoulder Adduction - 1 x daily - 7 x weekly - 2-3 sets - 10 reps - 5 seconds hold Seated Shoulder Flexion AAROM with Pulley Behind - 1 x daily - 7 x weekly - 3 sets - 10 reps Supine Chin Tuck - 1 x daily - 7 x weekly - 3 sets - 10 reps Supine Shoulder Horizontal Abduction and Adduction - 1 x daily - 7 x weekly - 3 sets - 10 reps Single Arm Shoulder Extension with Anchored Resistance - 1 x daily - 7 x weekly - 3 sets - 10 reps

## 2021-09-24 NOTE — Therapy (Signed)
[facility] [address] [place], [address] Phone: [phone]   Fax:  [phone]  Physical Therapy Treatment  Patient Details  Name: Lynn Mann MRN: [id number] Date of Birth: Jul 14, 1951 Referring Provider (PT): Rosalia Hammers, DO   Encounter Date: 09/24/2021   PT End of Session - 09/24/21 1415     Visit Number 28    Number of Visits 58    Date for PT Re-Evaluation 10/04/21    Authorization Time Period 10    PT Start Time 1416    PT Stop Time 1505    PT Time Calculation (min) 49 min    Activity Tolerance Patient tolerated treatment well    Behavior During Therapy [facility] for tasks assessed/performed             Past Medical History:  Diagnosis Date   Arthritis    C. difficile diarrhea    age 40s-40s    Chicken pox    Cholecystitis 11/2011   Did not require sgy - Dr. Staci Acosta - [facility]  (cholelithiasis)   H/O Clostridium difficile infection    IBS (irritable bowel syndrome)    MRSA exposure 2005   Spider bite   MVP (mitral valve prolapse)    Stable - Dr. Ubaldo Glassing   Rheumatic fever     Past Surgical History:  Procedure Laterality Date   CATARACT EXTRACTION  [id number]   MUSCLE BIOPSY      There were no vitals filed for this visit.   Subjective Assessment - 09/24/21 1416     Subjective No pain currently. Had a little trouble this morning, slept on her L side then rolled on her back. Lying on her back and L side bothers her R shoulder.    Pertinent History R shoulder did not start really hurting until about a month after her fall. Just started with a little pain at the [facility] joint and clavicle. Sometimes she also feels pain along her R pectoralis muscle in the shape of a triangle. Pt fell June 2022. Pt got up to go to the bathroom and tripped over a suitcase. Pt fell against the garment rack on her R side which kind of shielded her R side. Pt also got a hematoma the size of a baked potato at her L  leg afterwards. Pt hurt R toes during the fall which got better after 3 days. Went to urgent care for her leg. Hematoma has improved but still has a tiny knot in her L leg. Pt is R hand dominant. Pt was in PT around May to July 2022 for for her R hip which is better.    Patient Stated Goals Not hurt.    Currently in Pain? No/denies    Pain Onset More than a month ago                                        PT Education - 09/24/21 1429     Education Details ther-ex    Person(s) Educated Patient    Methods Explanation;Demonstration;Tactile cues;Verbal cues    Comprehension Returned demonstration;Verbalized understanding            Manual therapy    Seated STM R cervical paraspinal and R upper trap muscles to decrease muscle tension  Decreased muscle tension reported afterwards reported    There-ex:  R shoulder flexion at start of session  144 degrees AROM   Standing L cervical side bend to stretch R side. 10x5 seconds   Standing R first rib stretch 4x5 seconds uncomfortable  Standing R cervical side bend 10x5 seconds  Decreased R lateral neck pain. R clavicle area discomfort  Decreased R shoulder flexion AROM but demonstrates scapular protraction  Seated manually resisted scapular retraction targeting lower trap 10x3 with 5 second holds  Standing R shoulder extension, yellow band attached at top of door  10x4  Improved R shoulder flexion AROM afterwards  Standing R shoulder adduction yellow band 10x3  Seated R shoulder extension, yellow band 10x   Standing B shoulder ER yellow band 10x2    Improved exercise technique, movement at target joints, use of target muscles after mod verbal, visual, tactile cues.                   Response to treatment Fair tolerance to today's session.        Clinical impression Decreased R lateral neck pain with R cervical side bending. Improved ability to raise her R UE with exercises to  promote posterior shoulder and lower trap muscle strength. Fair tolerance to today's session.  Pt will benefit from continued skilled physical therapy services to decrease pain, improve strength and function.          PT Short Term Goals - 07/18/21 1111       PT SHORT TERM GOAL #1   Title Pt will be independent with her initial HEP to decrease pain, improve strength, function, and ability to reach, push, and pull more comfortably.    Baseline Pt has started her HEP (05/22/2021); Pt performing her HEP. Has some questions but forgot what they are (06/25/2021), No questions with HEP, doing them (07/18/2021)    Time 3    Period Weeks    Status Achieved    Target Date 06/14/21               PT Long Term Goals - 09/10/21 1423       PT LONG TERM GOAL #1   Title Pt will have a decrease in R shoulder pain to 3/10 or less at worst to promote ability to reach, open and close the door, lift items more comfortably.    Baseline 10/10 R shoulder pain at worst for the past 2 months (05/22/2021); 6/10 at worst for the past 7 days (06/25/2021); 9-10/10 at worst for the past 7 days (07/18/2021); (07/24/2021); 08/20/21: Worst pain reported at 8/10 NPS.  8/10 at worst for the past 7 days (sudden and sharp ) 09/10/2021)    Time 4    Period Weeks    Status On-going    Target Date 10/04/21      PT LONG TERM GOAL #2   Title Pt will improve R shoulder flexion AROM to 150 degrees or more without pain to promote ability to reach.    Baseline 90 degrees R shoulder flexion AROM with pain (05/22/2021); 123 degrees flexion AROM, 147 degrees after resisted extension exercise (06/25/2021); 128 degrees flexion,, 97 degrees abduction  (07/18/2021); 36 degrees flexion (07/24/2021); 08/20/21: 135 degrees; 127 degrees flexion AROM (09/10/2021)    Time 4    Period Weeks    Status On-going    Target Date 10/04/21      PT LONG TERM GOAL #3   Title Pt will improve her R shoulder strength all planes by at least 1/2 MMT  grade to  promote ability to reach as well as lift items.    Baseline R shoulder flexion 4/5, abduction 4-/5, ER 4+/5, IR 4/5 (05/22/2021); R shoulder flexion 4+/5, abduction 4/5, ER 4+/5, IR 4+/5 (06/25/2021); R shoulder flexion 4/5, abduction 4/5, ER 4/5, IR 5/5    Time 4    Period Weeks    Status On-going    Target Date 10/04/21      PT LONG TERM GOAL #4   Title Pt will improve R shoulder FOTO score by at least 10 points as a demonstration of improved function.    Baseline R shoulder FOTO 49 (05/22/2021); 59 (06/25/2021)    Time 8    Period Weeks    Status Achieved    Target Date 07/19/21                   Plan - 09/24/21 1415     Clinical Impression Statement Decreased R lateral neck pain with R cervical side bending. Improved ability to raise her R UE with exercises to promote posterior shoulder and lower trap muscle strength. Fair tolerance to today's session.  Pt will benefit from continued skilled physical therapy services to decrease pain, improve strength and function.    Personal Factors and Comorbidities Age;Comorbidity 2    Comorbidities Arthritis, Mitral valve prolapse    Examination-Activity Limitations Lift;Reach Overhead;Carry;Bed Mobility    Stability/Clinical Decision Making Evolving/Moderate complexity   inconsistent progress   Rehab Potential Fair    PT Frequency 2x / week    PT Duration 4 weeks    PT Treatment/Interventions Therapeutic activities;Therapeutic exercise;Manual techniques;Electrical Stimulation;Iontophoresis 4mg /ml Dexamethasone;Functional mobility training;Neuromuscular re-education;Patient/family education;Dry needling    PT Next Visit Plan Decreasing sternocleidomastiod muscle tension, improve scapular strength    PT Home Exercise Plan Medbridge Access Code [email or web address]    Consulted and Agree with Plan of Care Patient             Patient will benefit from skilled therapeutic intervention in order to improve the following deficits and  impairments:  Pain, Improper body mechanics, Postural dysfunction, Decreased strength  Visit Diagnosis: Chronic right shoulder pain  Muscle weakness (generalized)     Problem List Patient Active Problem List   Diagnosis Date Noted   Cough 09/07/2021   Right shoulder pain 07/15/2021   Hematoma 02/11/2021   Ankle swelling 11/26/2020   Low back pain 11/22/2020   Hip pain, right 11/22/2020   Light headedness 06/24/2020   Hearing loss 06/24/2020   Change in vision 06/24/2020   Stress 05/17/2020   Chest pain 05/16/2020   Welcome to Medicare preventive visit 06/20/2019   Viral syndrome 01/30/2019   Itching 07/21/2017   Asthma 07/16/2016   Urinary incontinence 07/16/2016   SOB (shortness of breath) 04/01/2016   Bilateral shoulder pain 02/24/2016   Fatigue 01/28/2016   Neck fullness 01/28/2016   Routine general medical examination at a health care facility 07/25/2015   Health care maintenance 10/09/2014   Neck pain 11/26/2013   Hypercholesterolemia 11/26/2013   History of colonic polyps 05/11/2013   Hyperbilirubinemia 01/18/2013   GERD (gastroesophageal reflux disease) 12/03/2012   Cholelithiasis 11/07/2012   IBS (irritable bowel syndrome) 11/07/2012   History of rheumatic fever 08/28/2012   MVP (mitral valve prolapse) 08/28/2012    Joneen Boers PT, DPT  09/24/2021, 5:05 PM   [facility] [address], [place], [address] Phone: [phone]   Fax:  [phone]  Name: KAPRI NERO MRN: [id number] Date  of Birth: March 07, 1951

## 2021-09-26 ENCOUNTER — Emergency Department
Admission: EM | Admit: 2021-09-26 | Discharge: 2021-09-27 | Disposition: A | Discharge status: Home / Self Care | Payer: PPO | Attending: Emergency Medicine | Admitting: Emergency Medicine

## 2021-09-26 ENCOUNTER — Encounter: Payer: Self-pay | Admitting: *Deleted

## 2021-09-26 ENCOUNTER — Other Ambulatory Visit: Payer: Self-pay

## 2021-09-26 ENCOUNTER — Emergency Department: Payer: PPO

## 2021-09-26 DIAGNOSIS — M79632 Pain in left forearm: Secondary | ICD-10-CM | POA: Insufficient documentation

## 2021-09-26 DIAGNOSIS — M542 Cervicalgia: Secondary | ICD-10-CM | POA: Insufficient documentation

## 2021-09-26 DIAGNOSIS — S20212A Contusion of left front wall of thorax, initial encounter: Secondary | ICD-10-CM | POA: Insufficient documentation

## 2021-09-26 DIAGNOSIS — Y92009 Unspecified place in unspecified non-institutional (private) residence as the place of occurrence of the external cause: Secondary | ICD-10-CM

## 2021-09-26 DIAGNOSIS — S299XXA Unspecified injury of thorax, initial encounter: Secondary | ICD-10-CM | POA: Diagnosis present

## 2021-09-26 DIAGNOSIS — R079 Chest pain, unspecified: Secondary | ICD-10-CM | POA: Diagnosis not present

## 2021-09-26 DIAGNOSIS — S8002XA Contusion of left knee, initial encounter: Secondary | ICD-10-CM | POA: Diagnosis not present

## 2021-09-26 DIAGNOSIS — M7989 Other specified soft tissue disorders: Secondary | ICD-10-CM | POA: Diagnosis not present

## 2021-09-26 DIAGNOSIS — M545 Low back pain, unspecified: Secondary | ICD-10-CM | POA: Diagnosis not present

## 2021-09-26 DIAGNOSIS — W19XXXA Unspecified fall, initial encounter: Secondary | ICD-10-CM

## 2021-09-26 DIAGNOSIS — M25522 Pain in left elbow: Secondary | ICD-10-CM | POA: Insufficient documentation

## 2021-09-26 DIAGNOSIS — Y9301 Activity, walking, marching and hiking: Secondary | ICD-10-CM | POA: Diagnosis not present

## 2021-09-26 DIAGNOSIS — W010XXA Fall on same level from slipping, tripping and stumbling without subsequent striking against object, initial encounter: Secondary | ICD-10-CM | POA: Diagnosis not present

## 2021-09-26 DIAGNOSIS — M25562 Pain in left knee: Secondary | ICD-10-CM | POA: Diagnosis not present

## 2021-09-26 DIAGNOSIS — M79602 Pain in left arm: Secondary | ICD-10-CM | POA: Diagnosis not present

## 2021-09-26 DIAGNOSIS — M7918 Myalgia, other site: Secondary | ICD-10-CM

## 2021-09-26 IMAGING — CR DG FOREARM 2V*L*
2 series · 2 of 2 positions shown · non-contrast
Comparison: None.

CLINICAL DATA: Fall, left arm pain

EXAM:
LEFT FOREARM - 2 VIEW

[forearm ap]
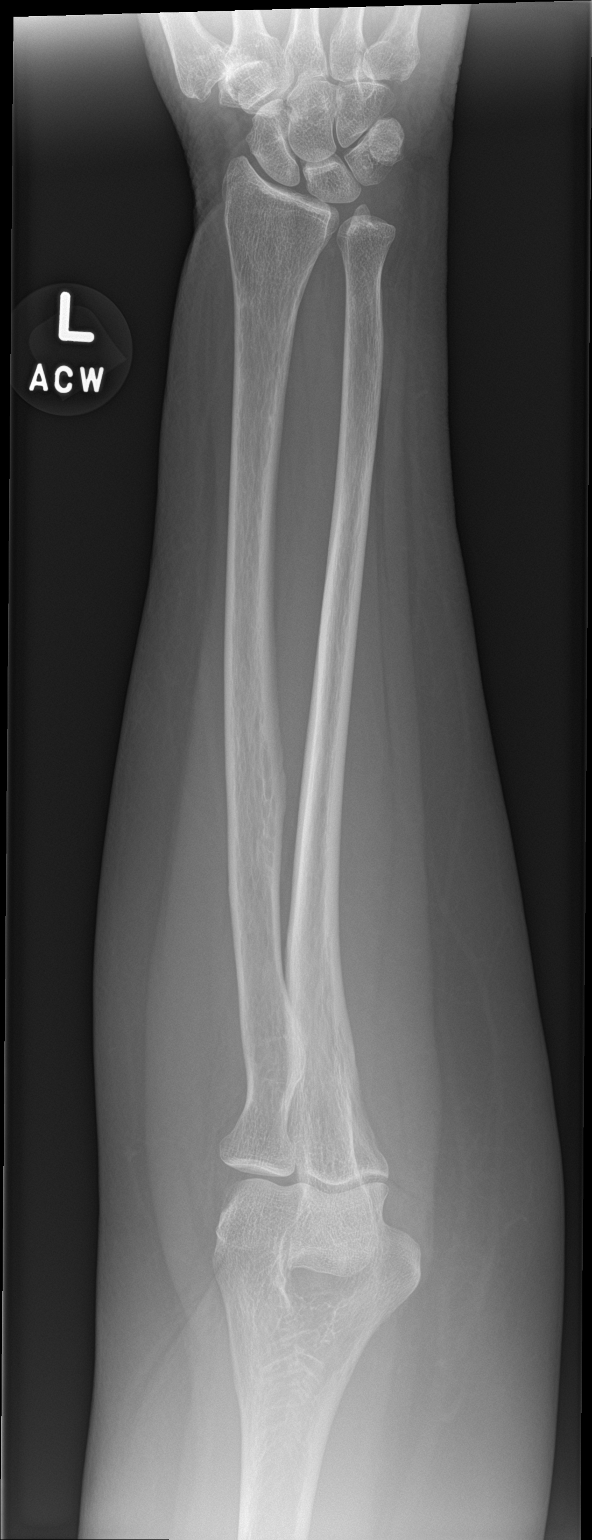

[forearm lat]
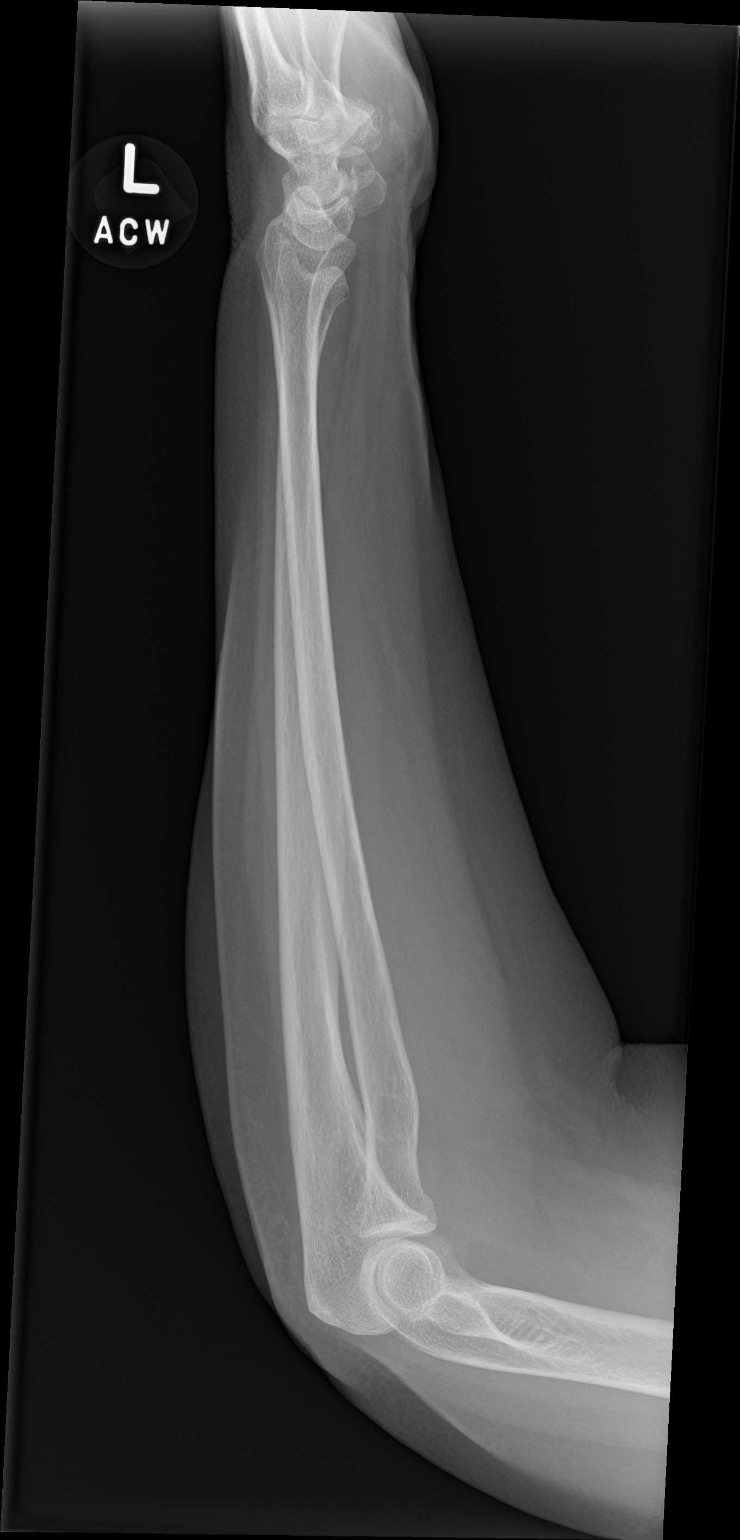

[2 of 2 positions shown; findings below may reference images not displayed]

FINDINGS: There is no evidence of fracture or other focal bone lesions. Soft
tissues are unremarkable.
IMPRESSION: Negative.

## 2021-09-26 IMAGING — CR DG ANKLE COMPLETE 3+V*L*
3 series · 3 of 3 positions shown · non-contrast
Comparison: None.

CLINICAL DATA: Fall

EXAM:
LEFT ANKLE COMPLETE - 3+ VIEW

[ankle ap]
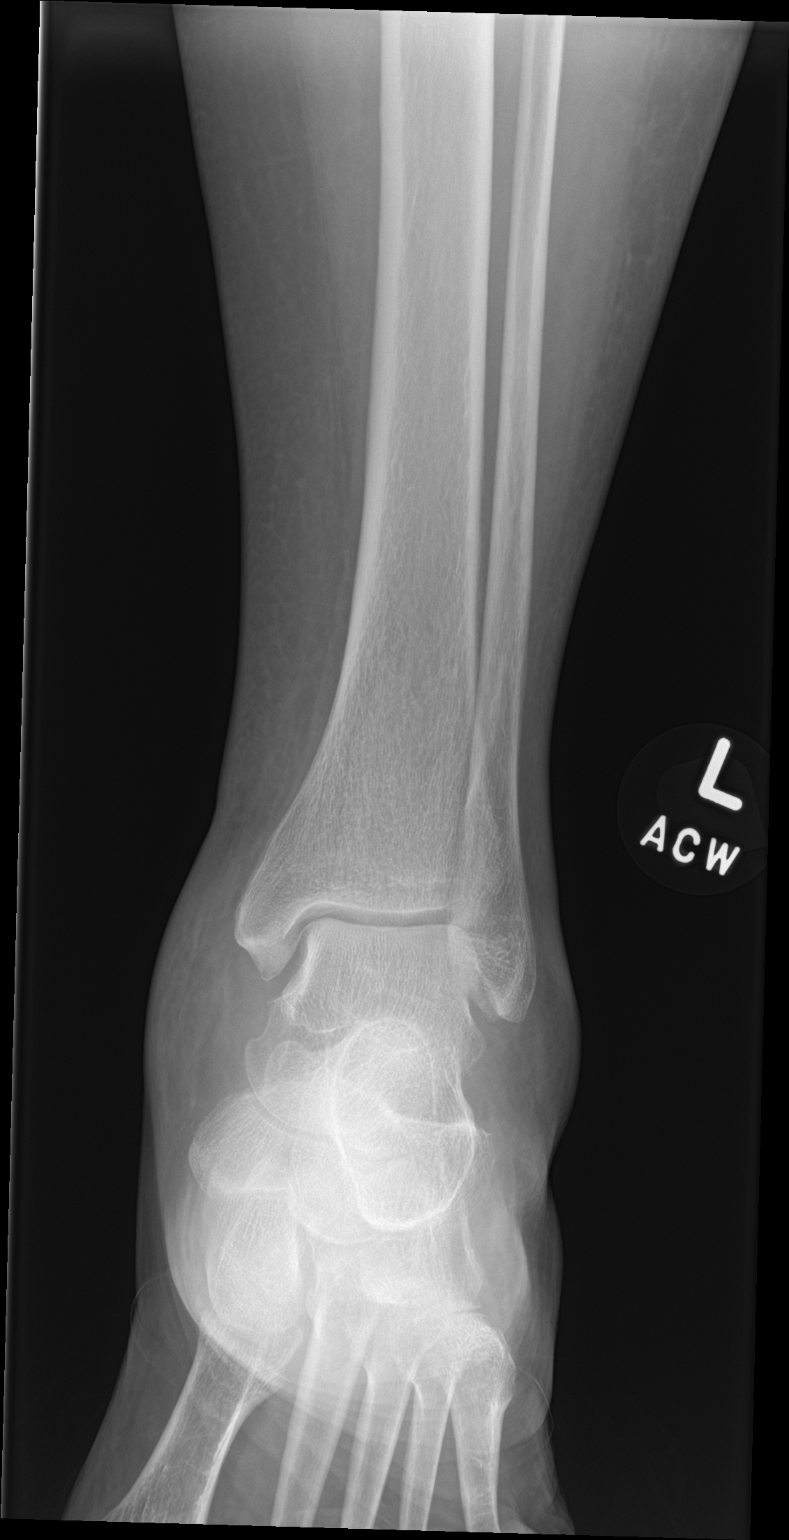

[ankle obl]
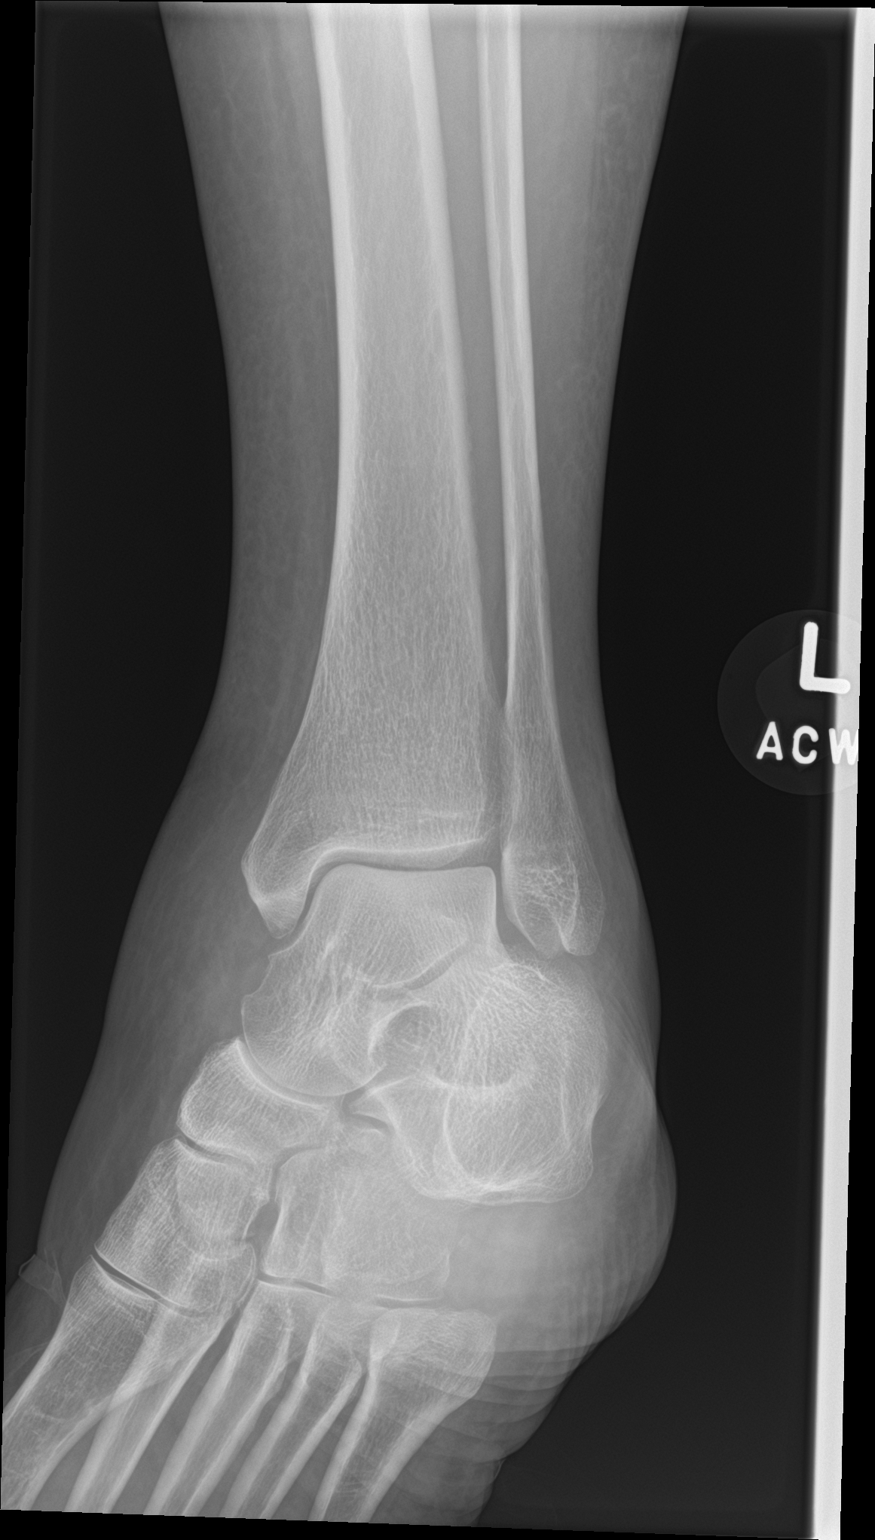

[ankle lat]
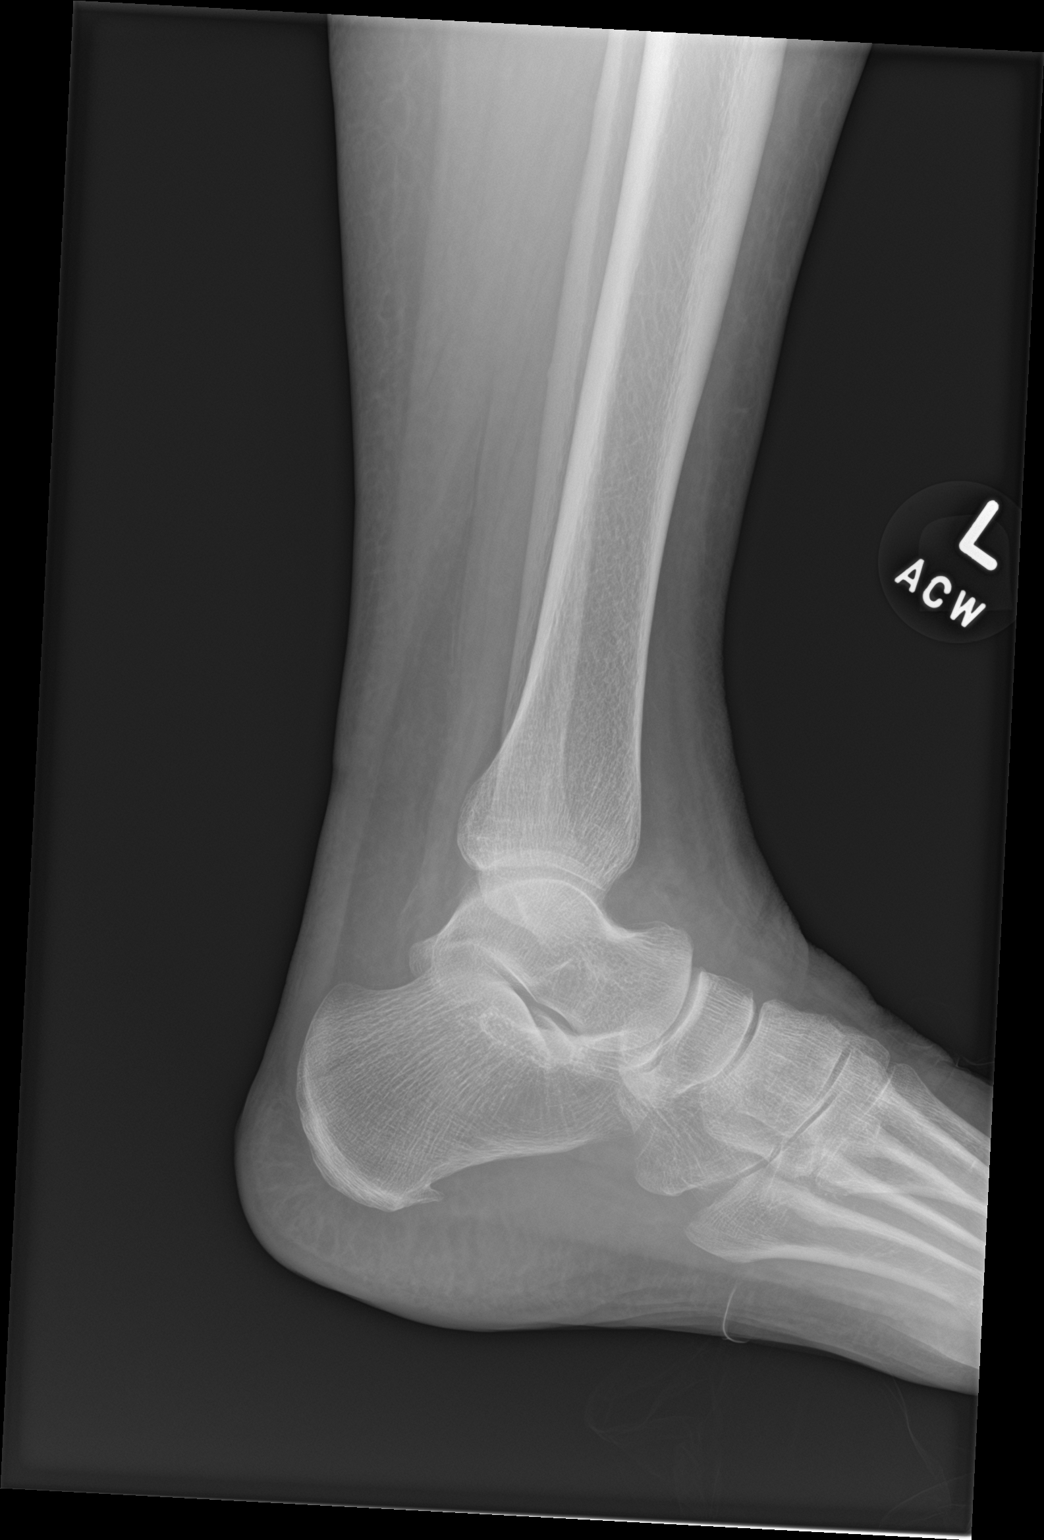

[3 of 3 positions shown; findings below may reference images not displayed]

FINDINGS: Diffuse soft tissue swelling. No acute bony abnormality.
Specifically, no fracture, subluxation, or dislocation. Joint spaces
maintained.
IMPRESSION: No acute bony abnormality.

## 2021-09-26 IMAGING — CR DG RIBS W/ CHEST 3+V*L*
5 series · 5 of 5 positions shown · non-contrast
Comparison: None.

CLINICAL DATA: Fall, chest pain

EXAM:
LEFT RIBS AND CHEST - 3+ VIEW

[chest pa]
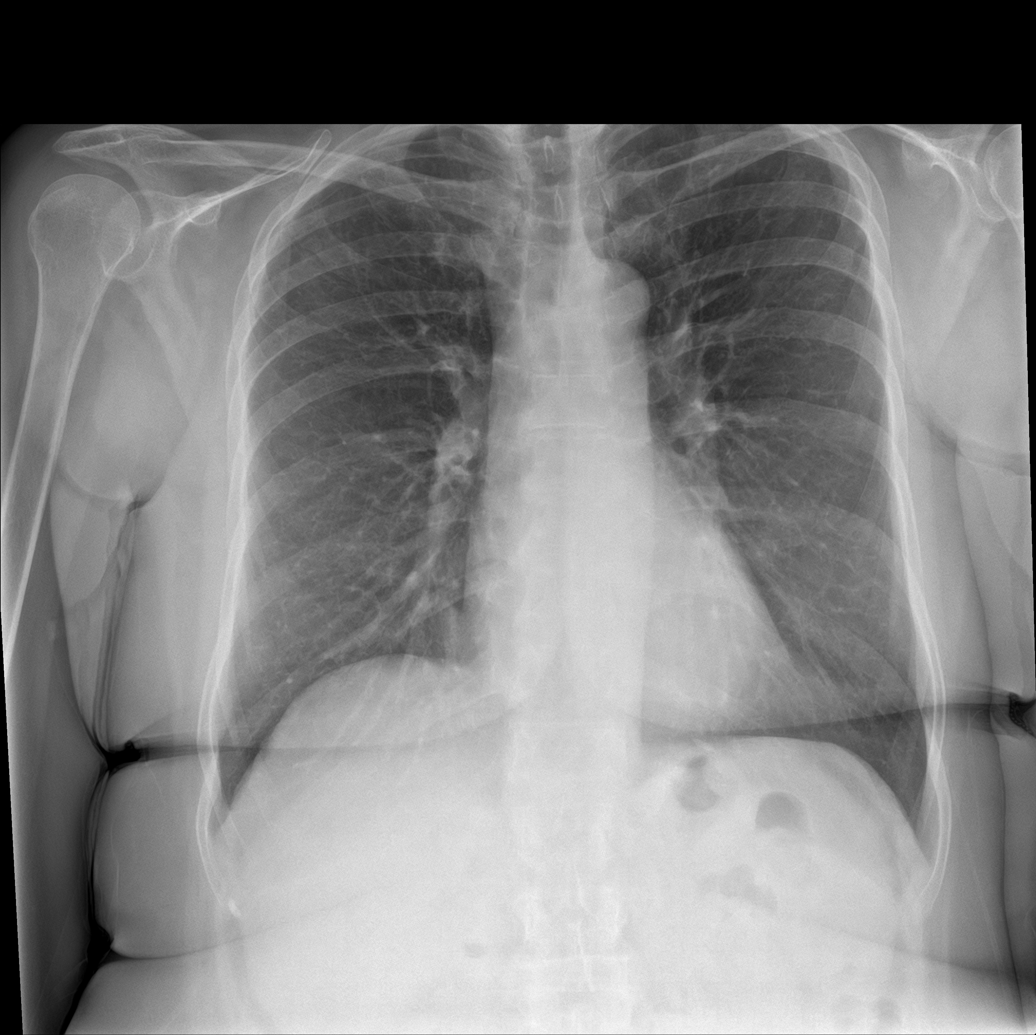

[rib pa (1 of 2)]
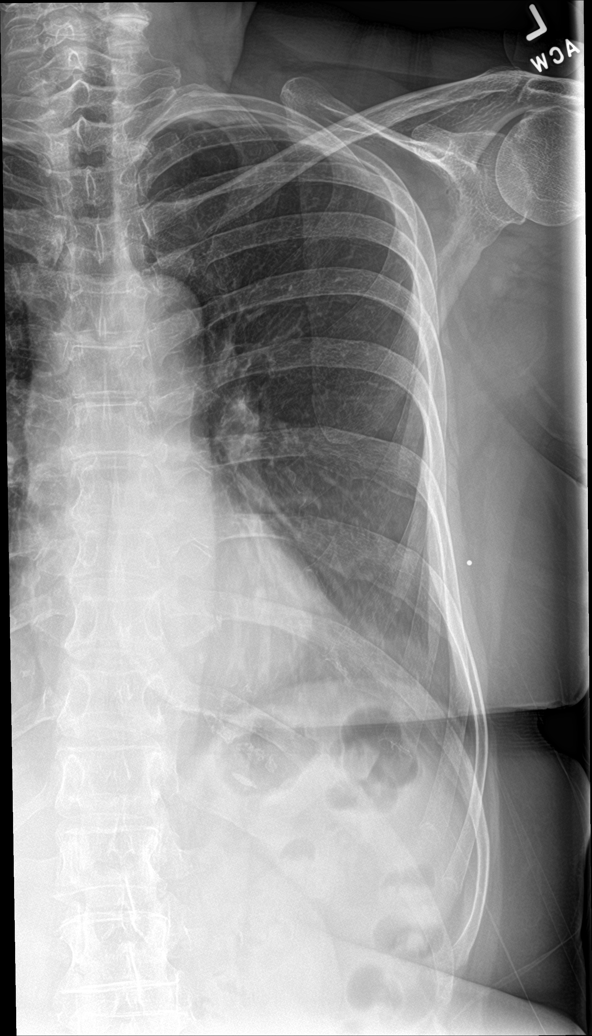

[rib pa obl (1 of 2)]
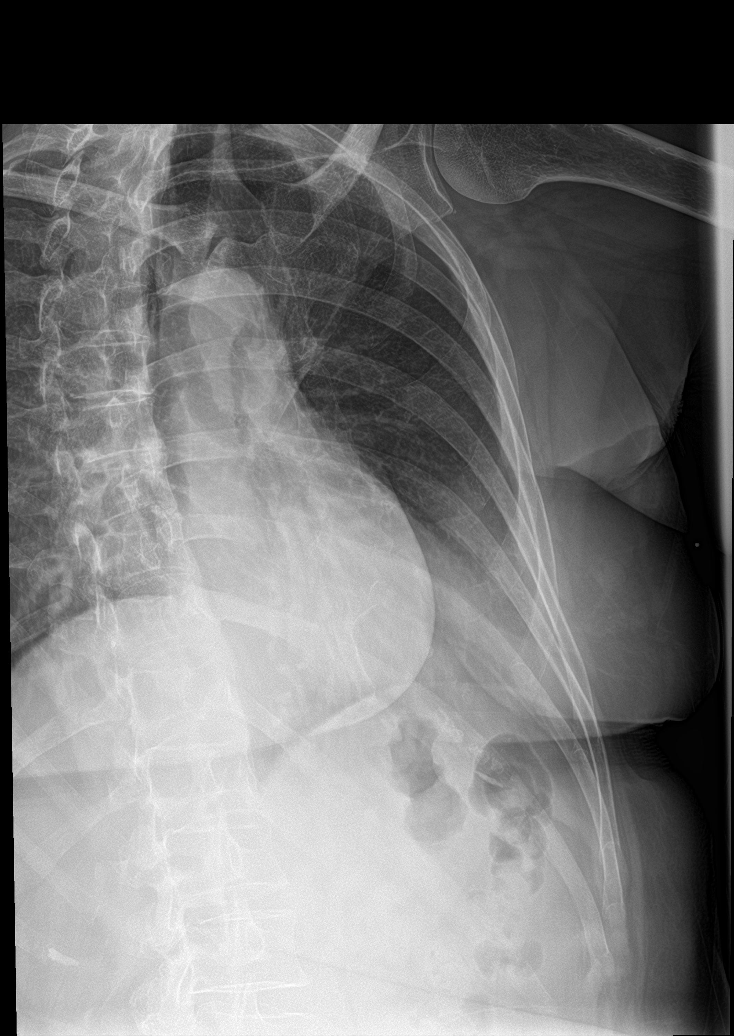

[rib pa (2 of 2)]
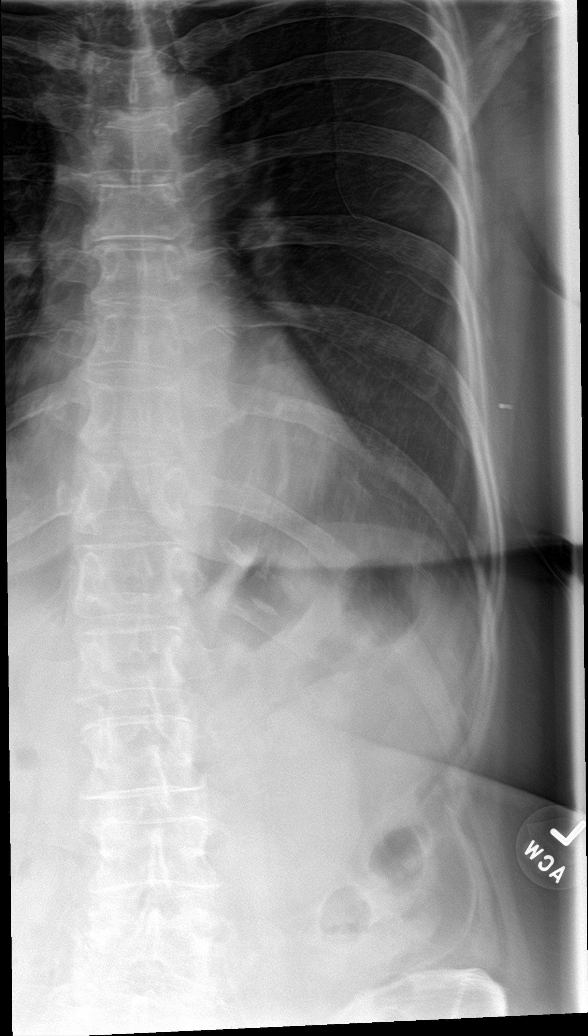

[rib pa obl (2 of 2)]
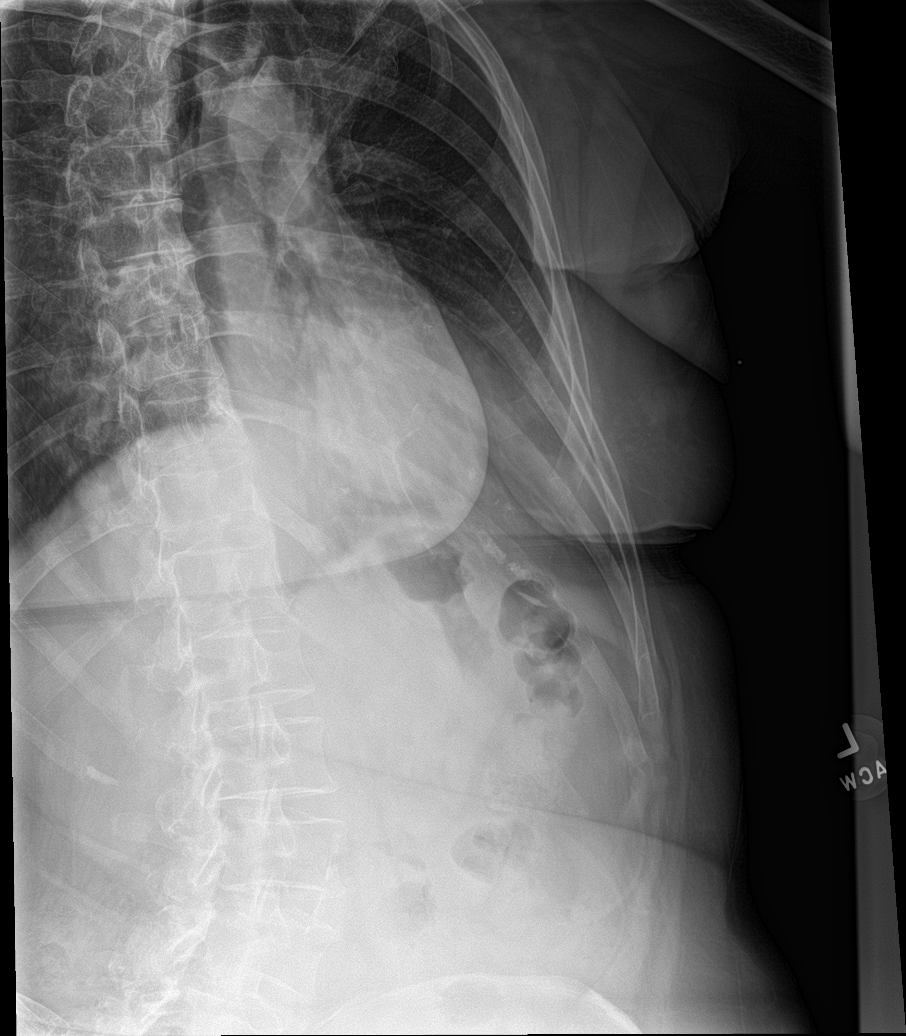

[5 of 5 positions shown; findings below may reference images not displayed]

FINDINGS: No fracture or other bone lesions are seen involving the ribs. There
is no evidence of pneumothorax or pleural effusion. Both lungs are
clear. Heart size and mediastinal contours are within normal limits.
IMPRESSION: Negative.

## 2021-09-26 IMAGING — CR DG KNEE COMPLETE 4+V*L*
4 series · 4 of 4 positions shown · non-contrast
Comparison: None.

CLINICAL DATA: Fall, pain

EXAM:
LEFT KNEE - COMPLETE 4+ VIEW

[knee ap]
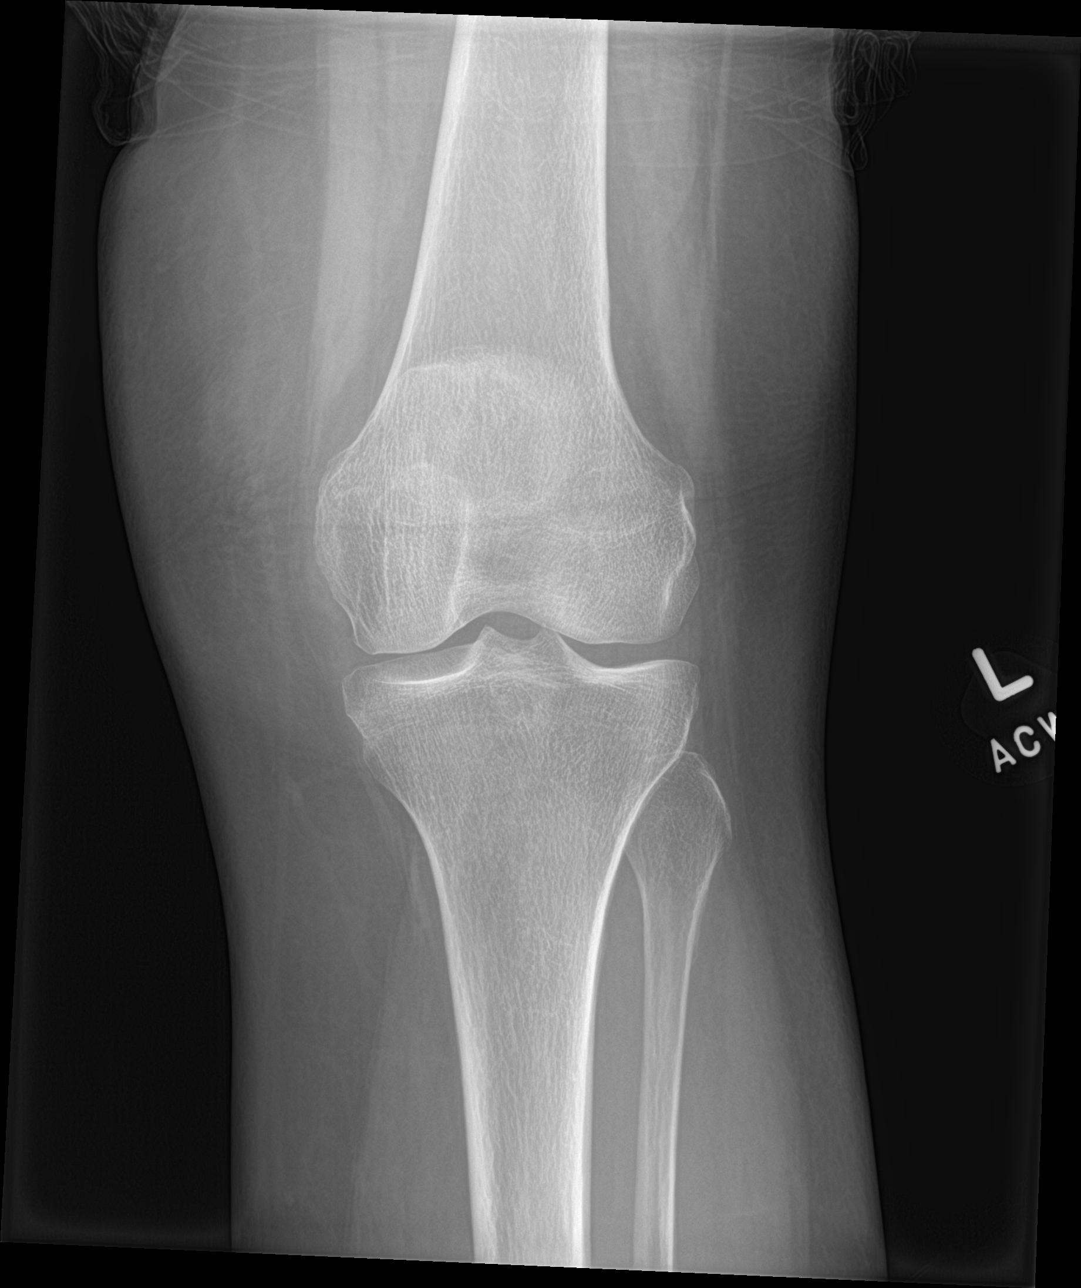

[knee obl (1 of 2)]
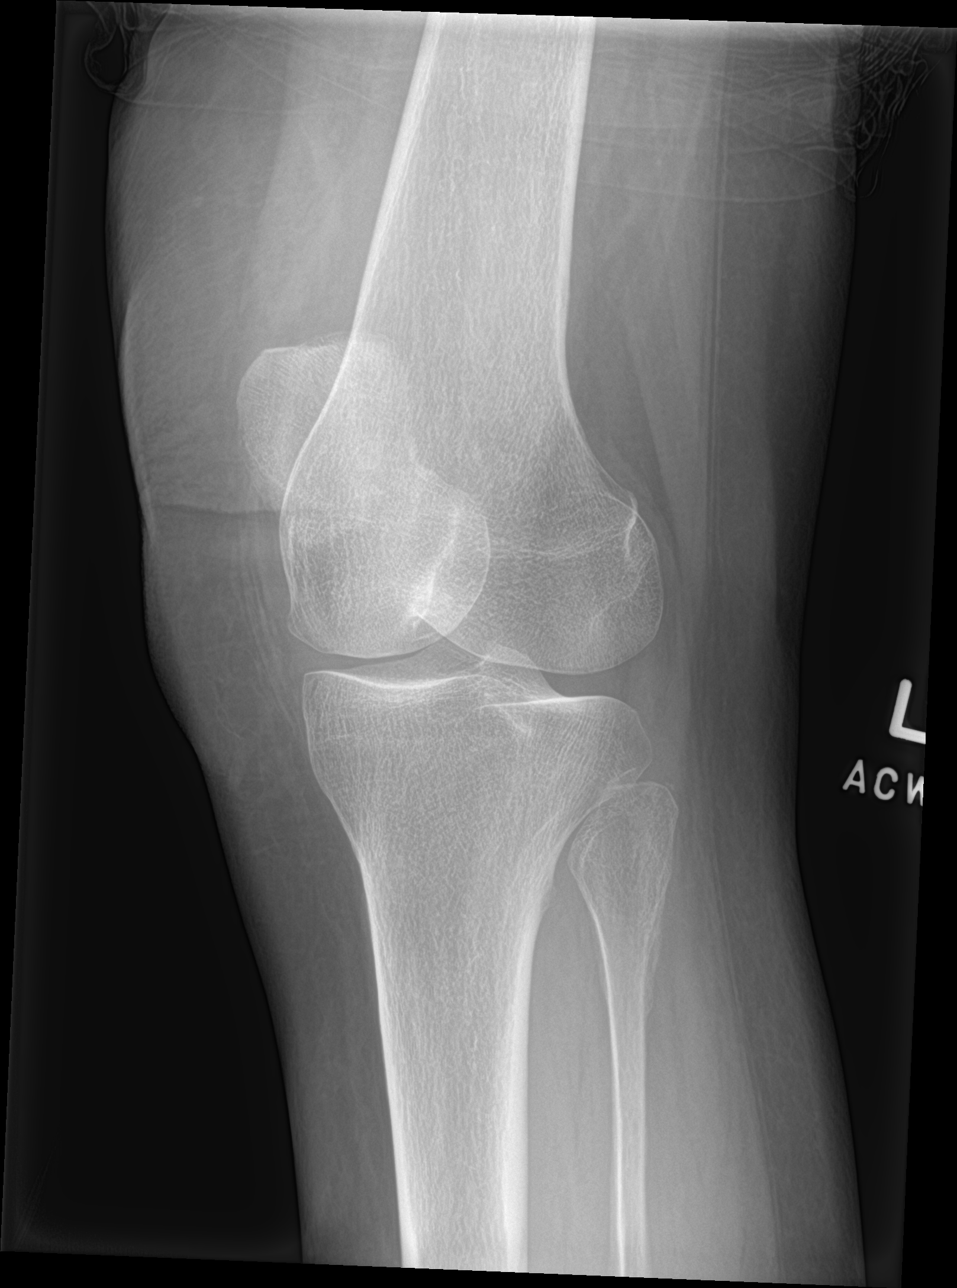

[knee obl (2 of 2)]
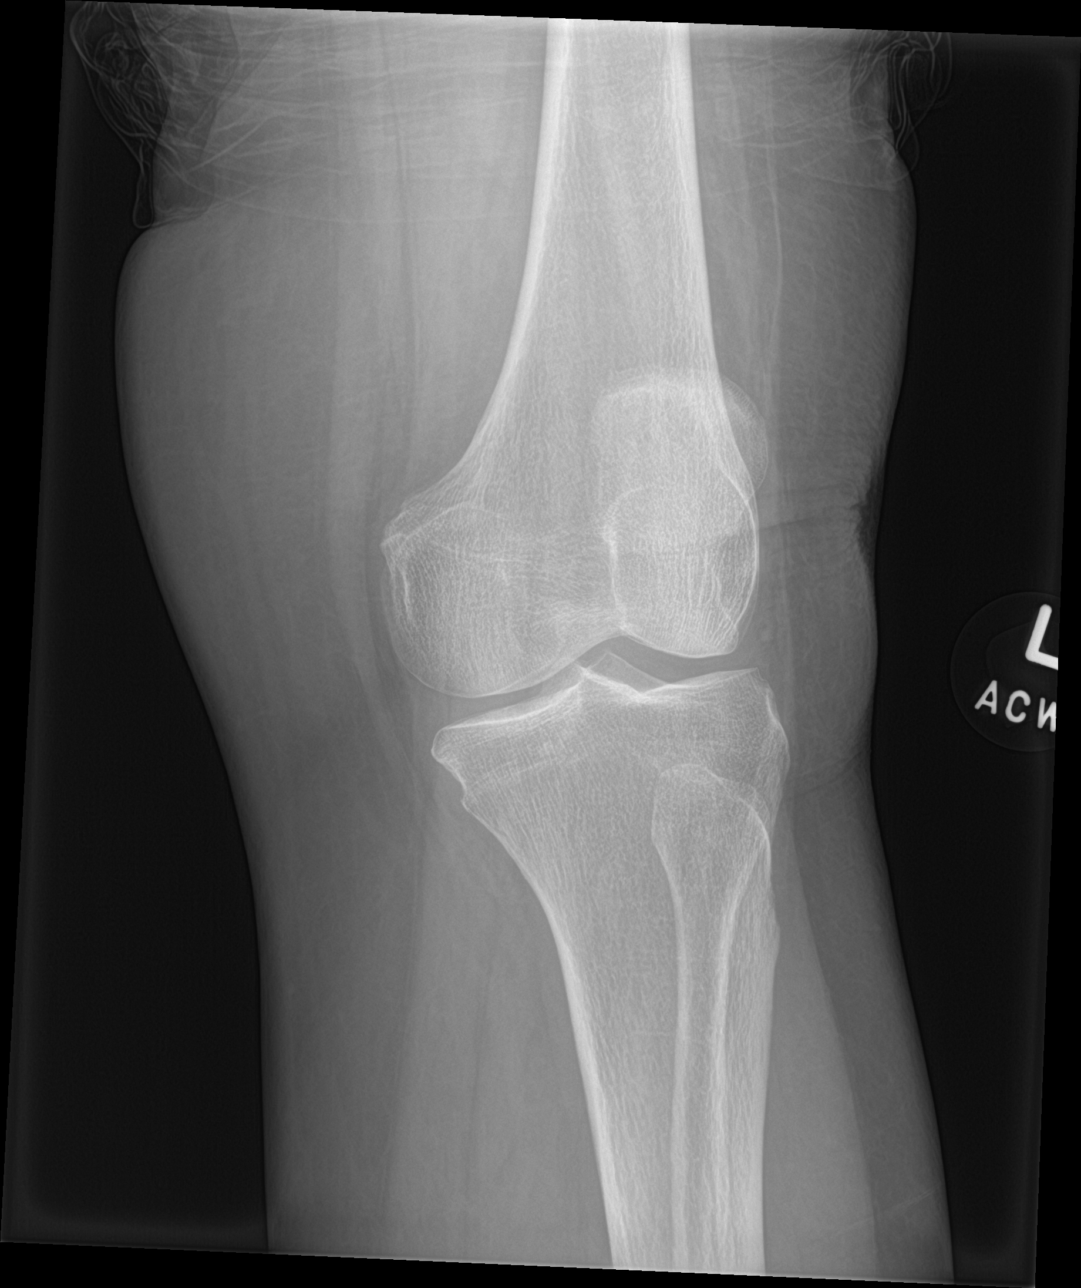

[knee lat]
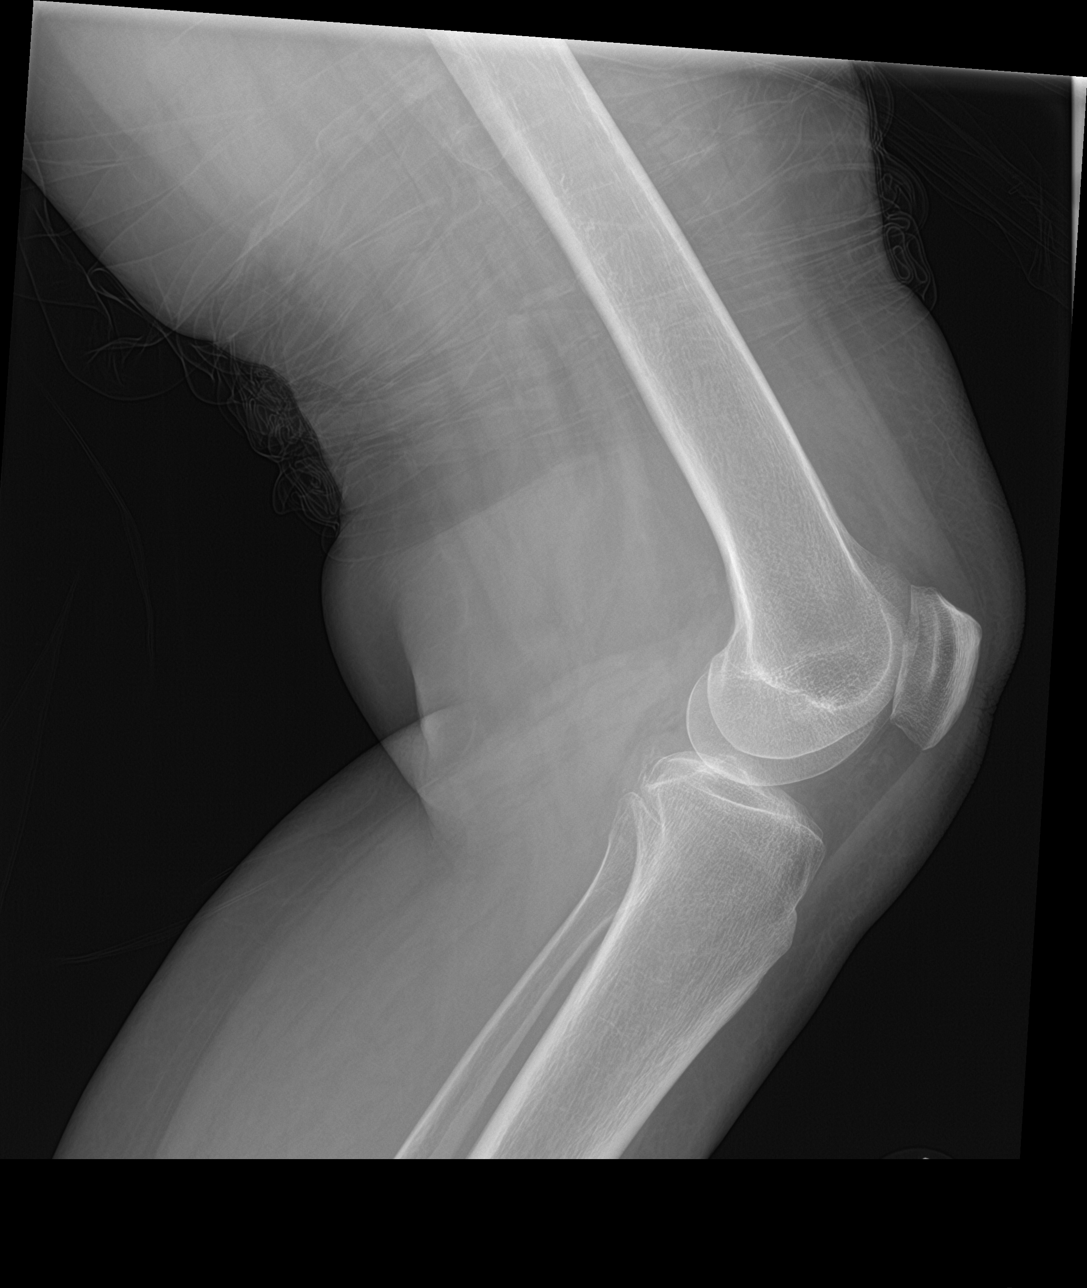

[4 of 4 positions shown; findings below may reference images not displayed]

FINDINGS: No evidence of fracture, dislocation, or joint effusion. No evidence
of arthropathy or other focal bone abnormality. Soft tissues are
unremarkable.
IMPRESSION: Negative.

## 2021-09-26 NOTE — ED Triage Notes (Signed)
Pt tripped on a table and fell today.  Pt has neck pain and left knee and left forearm pain.  Pt also has back pain.  No loc  pt alert  speech clear.  ?

## 2021-09-26 NOTE — ED Provider Notes (Signed)
? ? ?[facility] ?Emergency Department Provider Note ? ? ? ? Event Date/Time  ? First MD Initiated Contact with Patient 09/26/21 2136   ?  (approximate) ? ? ?History  ? ?Fall ? ? ?HPI ? ?Lynn Mann is a 71 y.o. female with a history of arthritis, mitral valve prolapse, and arthritis presents to the ED following mechanical fall.  Patient reports tripping on a sock on the floor and falling today while walking.  She presents with complaints of neck pain as well as pain to the left knee and left elbow/forearm.  She also endorses some lower back pain.  Patient denies any head injury or LOC. ?  ? ? ?Physical Exam  ? ?Triage Vital Signs: ?ED Triage Vitals  ?Enc Vitals Group  ?   BP 09/26/21 2104 125/83  ?   Pulse Rate 09/26/21 2104 79  ?   Resp 09/26/21 2104 20  ?   Temp 09/26/21 2104 99.3 ?F (37.4 ?C)  ?   Temp Source 09/26/21 2104 Oral  ?   SpO2 09/26/21 2104 95 %  ?   Weight 09/26/21 2101 180 lb (81.6 kg)  ?   Height 09/26/21 2101 5\' 7"  (1.702 m)  ?   Head Circumference --   ?   Peak Flow --   ?   Pain Score 09/26/21 2101 8  ?   Pain Loc --   ?   Pain Edu? --   ?   Excl. in [place]? --   ? ? ?Most recent vital signs: ?Vitals:  ? 09/26/21 2104 09/26/21 2355  ?BP: 125/83 136/64  ?Pulse: 79 71  ?Resp: 20 18  ?Temp: 99.3 ?F (37.4 ?C) 98.6 ?F (37 ?C)  ?SpO2: 95% 98%  ? ? ?General Awake, no distress.  ?CV:  Good peripheral perfusion. RRR ?RESP:  Normal effort. CTA ?ABD:  No distention. Soft, nontender ?MSK:  Left knee without obvious deformity or dislocation.  No joint effusion is appreciated.  No active range of motion on exam.  Normal spinal alignment without midline tenderness, spasm, deformity, or step-off. ? ? ?ED Results / Procedures / Treatments  ? ?Labs ?(all labs ordered are listed, but only abnormal results are displayed) ?Labs Reviewed - No data to display ? ? ?EKG ? ? ?RADIOLOGY ? ?I personally viewed and evaluated these images as part of my medical decision making, as well as reviewing the  written report by the radiologist. ? ?ED Provider Interpretation: no acute findings} ? ?DG Ribs Unilateral W/Chest Left ? ?Result Date: 09/26/2021 ?CLINICAL DATA:  Fall, chest pain EXAM: LEFT RIBS AND CHEST - 3+ VIEW COMPARISON:  None. FINDINGS: No fracture or other bone lesions are seen involving the ribs. There is no evidence of pneumothorax or pleural effusion. Both lungs are clear. Heart size and mediastinal contours are within normal limits. IMPRESSION: Negative. Electronically Signed   By: Fidela Salisbury M.D.   On: 09/26/2021 23:26  ? ?DG Cervical Spine Complete ? ?Result Date: 09/27/2021 ?CLINICAL DATA:  Fall, neck pain EXAM: CERVICAL SPINE - COMPLETE 4+ VIEW COMPARISON:  None. FINDINGS: Normal alignment. No fracture. Prevertebral soft tissues are normal. Moderate to advanced diffuse degenerative facet disease, left greater than right. Left neural foraminal narrowing at C3-4. Right neural foraminal narrowing at C4-5. IMPRESSION: Degenerative facet disease.  No acute bony abnormality. Electronically Signed   By: Rolm Baptise M.D.   On: 09/27/2021 01:07  ? ?DG Lumbar Spine Complete ? ?Result Date: 09/26/2021 ?CLINICAL DATA:  Fall,  back pain EXAM: LUMBAR SPINE - COMPLETE 4+ VIEW COMPARISON:  11/22/2020 FINDINGS: Stable grade 1 anterolisthesis L4-5. No acute fracture or listhesis of the lumbar spine. Vertebral body height is preserved. There is intervertebral disc space narrowing at L4-5 in keeping with changes of mild degenerative disc disease. Advanced facet arthrosis is noted at L2-S1, most severe at L4-5 and L5-S1. The paraspinal soft tissues are unremarkable. IMPRESSION: Stable degenerative changes.  No acute fracture or listhesis. Electronically Signed   By: Fidela Salisbury M.D.   On: 09/26/2021 23:28  ? ?DG Forearm Left ? ?Result Date: 09/26/2021 ?CLINICAL DATA:  Fall, left arm pain EXAM: LEFT FOREARM - 2 VIEW COMPARISON:  None. FINDINGS: There is no evidence of fracture or other focal bone lesions. Soft tissues  are unremarkable. IMPRESSION: Negative. Electronically Signed   By: Fidela Salisbury M.D.   On: 09/26/2021 23:27  ? ?DG Ankle Complete Left ? ?Result Date: 09/26/2021 ?CLINICAL DATA:  Fall EXAM: LEFT ANKLE COMPLETE - 3+ VIEW COMPARISON:  None. FINDINGS: Diffuse soft tissue swelling. No acute bony abnormality. Specifically, no fracture, subluxation, or dislocation. Joint spaces maintained. IMPRESSION: No acute bony abnormality. Electronically Signed   By: Rolm Baptise M.D.   On: 09/26/2021 23:26  ? ?DG Knee Complete 4 Views Left ? ?Result Date: 09/26/2021 ?CLINICAL DATA:  Fall, pain EXAM: LEFT KNEE - COMPLETE 4+ VIEW COMPARISON:  None. FINDINGS: No evidence of fracture, dislocation, or joint effusion. No evidence of arthropathy or other focal bone abnormality. Soft tissues are unremarkable. IMPRESSION: Negative. Electronically Signed   By: Rolm Baptise M.D.   On: 09/26/2021 23:26   ? ? ?PROCEDURES: ? ?Critical Care performed: No ? ?Procedures ? ? ?MEDICATIONS ORDERED IN ED: ?Medications  ?lidocaine (LIDODERM) 5 % 1 patch (1 patch Transdermal Patch Applied 09/27/21 0035)  ?cyclobenzaprine (FLEXERIL) tablet 5 mg (5 mg Oral Given 09/27/21 0028)  ?naproxen (NAPROSYN) tablet 500 mg (500 mg Oral Given 09/27/21 0028)  ? ? ? ?IMPRESSION / MDM / ASSESSMENT AND PLAN / ED COURSE  ?I reviewed the triage vital signs and the nursing notes. ?             ?               ? ?Differential diagnosis includes, but is not limited to, knee sprain, knee fracture, forearm fracture, cervical strain, spinal compression fracture ? ?Geriatric patient with ED evaluation of injury sustained following mechanical fall.  Patient presents in no acute distress for evaluation of her complaints patient is presenting with complaints of multiple joints.  She is evaluated with plain film images which were all negative and reassuring for any acute findings.  I reviewed these images in detail, and concur with radiologist report.  Patient's diagnosis is consistent with  myalgias and arthralgias secondary to mechanical fall. Patient will be discharged home with prescriptions for cyclobenzaprine, naproxen, Lidoderm patches. Patient is to follow up with her primary provider as needed or otherwise directed. Patient is given ED precautions to return to the ED for any worsening or new symptoms. ? ?FINAL CLINICAL IMPRESSION(S) / ED DIAGNOSES  ? ?Final diagnoses:  ?Fall in home, initial encounter  ?Chest wall contusion, left, initial encounter  ?Contusion of left knee, initial encounter  ?Musculoskeletal pain  ? ? ? ?Rx / DC Orders  ? ?ED Discharge Orders   ? ?      Ordered  ?  cyclobenzaprine (FLEXERIL) 5 MG tablet  3 times daily PRN       ?  09/27/21 0112  ?  naproxen (NAPROSYN) 500 MG tablet  2 times daily with meals       ? 09/27/21 0112  ?  lidocaine (LIDODERM) 5 %  Every 12 hours PRN       ? 09/27/21 0112  ? ?  ?  ? ?  ? ? ? ?Note:  This document was prepared using Dragon voice recognition software and may include unintentional dictation errors. ? ?  ?Melvenia Needles, PA-C ?09/27/21 3709 ? ?  ?Arta Silence, MD ?09/30/21 [phone] ? ?

## 2021-09-27 ENCOUNTER — Emergency Department: Payer: PPO

## 2021-09-27 ENCOUNTER — Ambulatory Visit: Payer: PPO

## 2021-09-27 IMAGING — CR DG CERVICAL SPINE COMPLETE 4+V
6 series · 6 of 6 positions shown · non-contrast
Comparison: None.

CLINICAL DATA: Fall, neck pain

EXAM:
CERVICAL SPINE - COMPLETE 4+ VIEW

[c-spine lat]
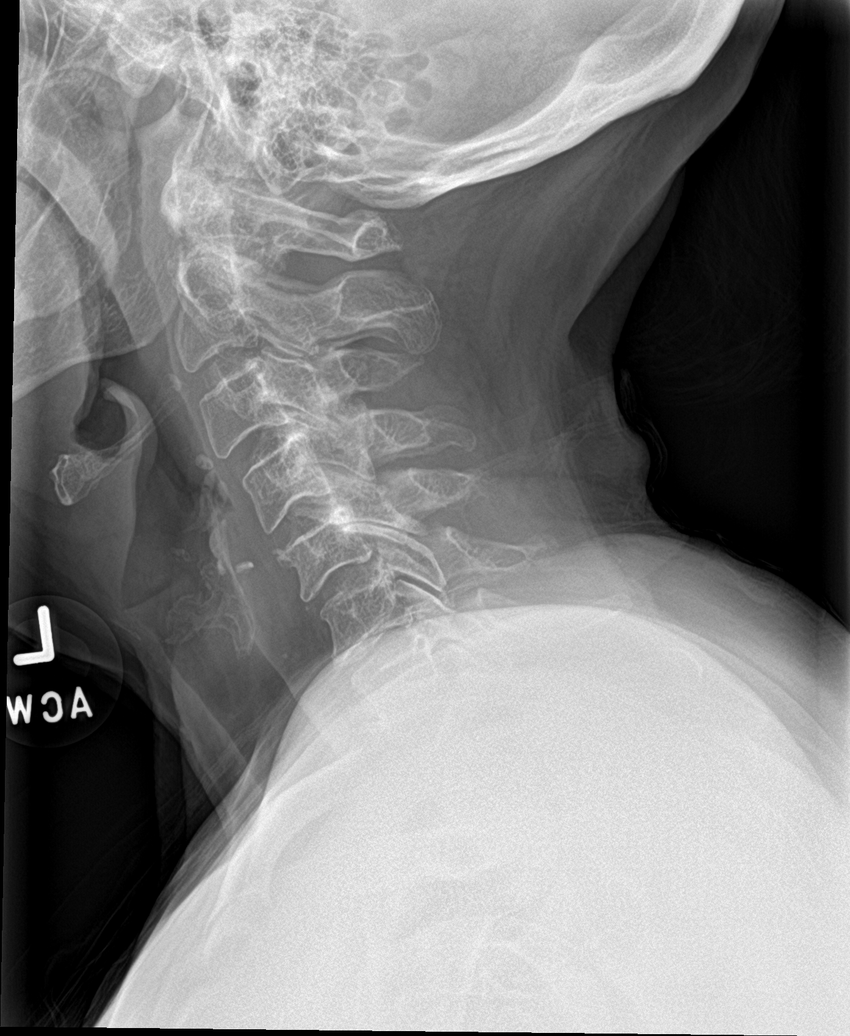

[c-spine obl (1 of 2)]
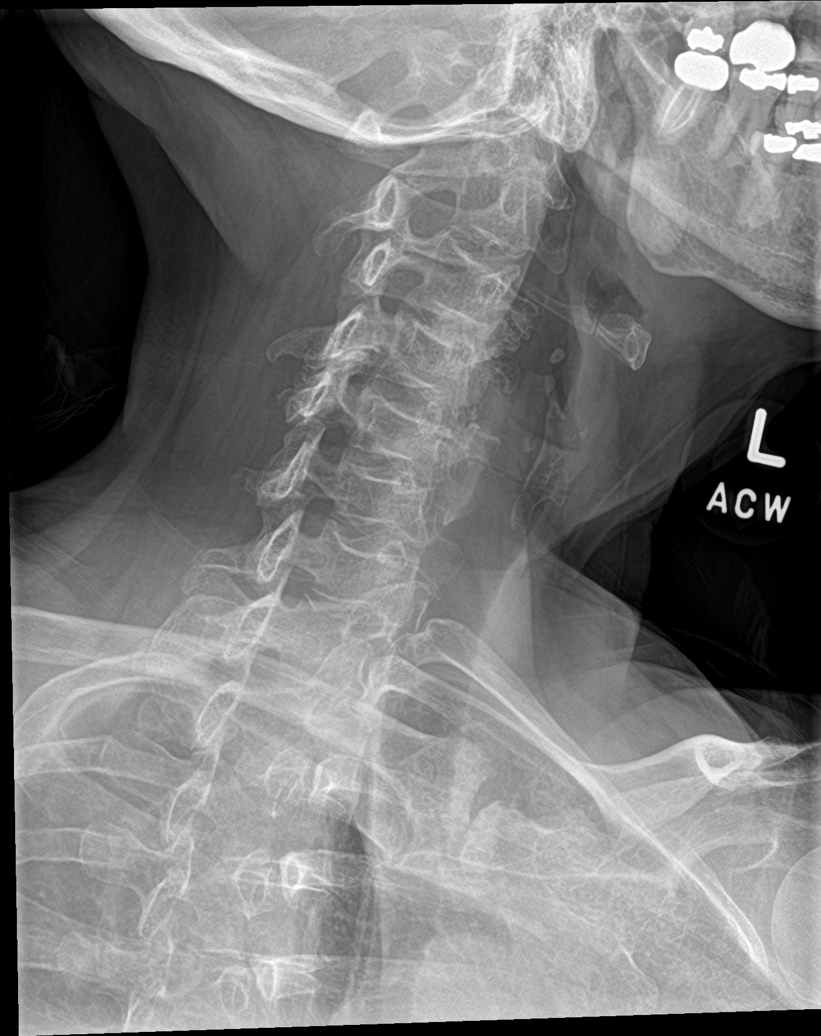

[c-spine obl (2 of 2)]
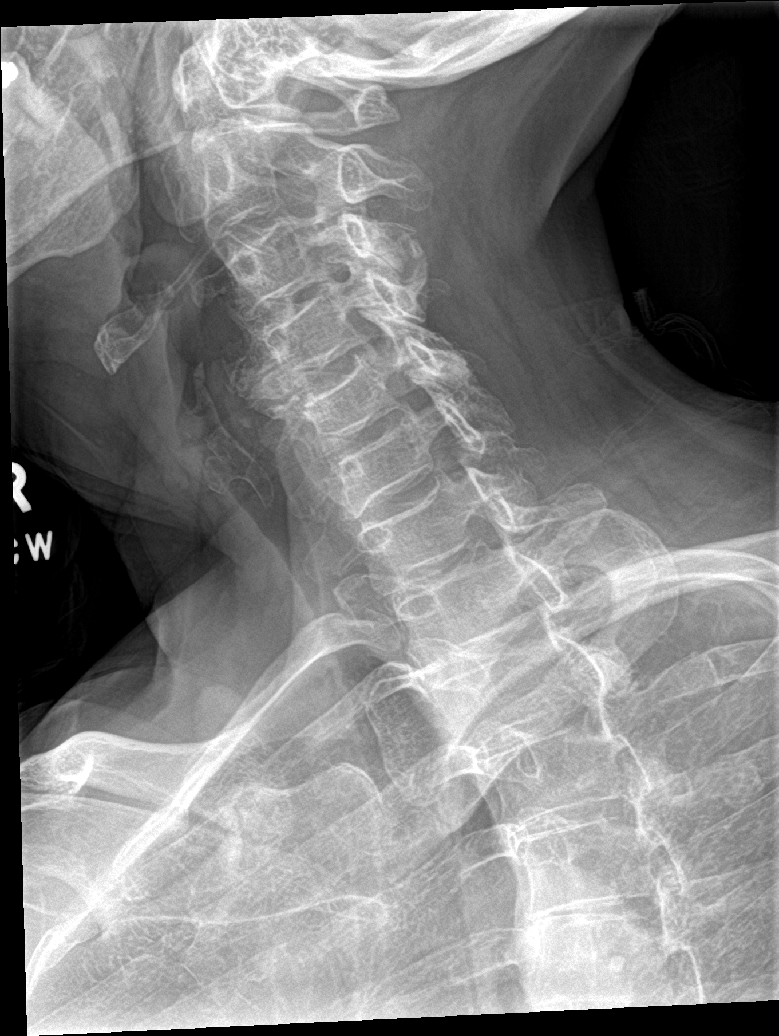

[c-spine ap]
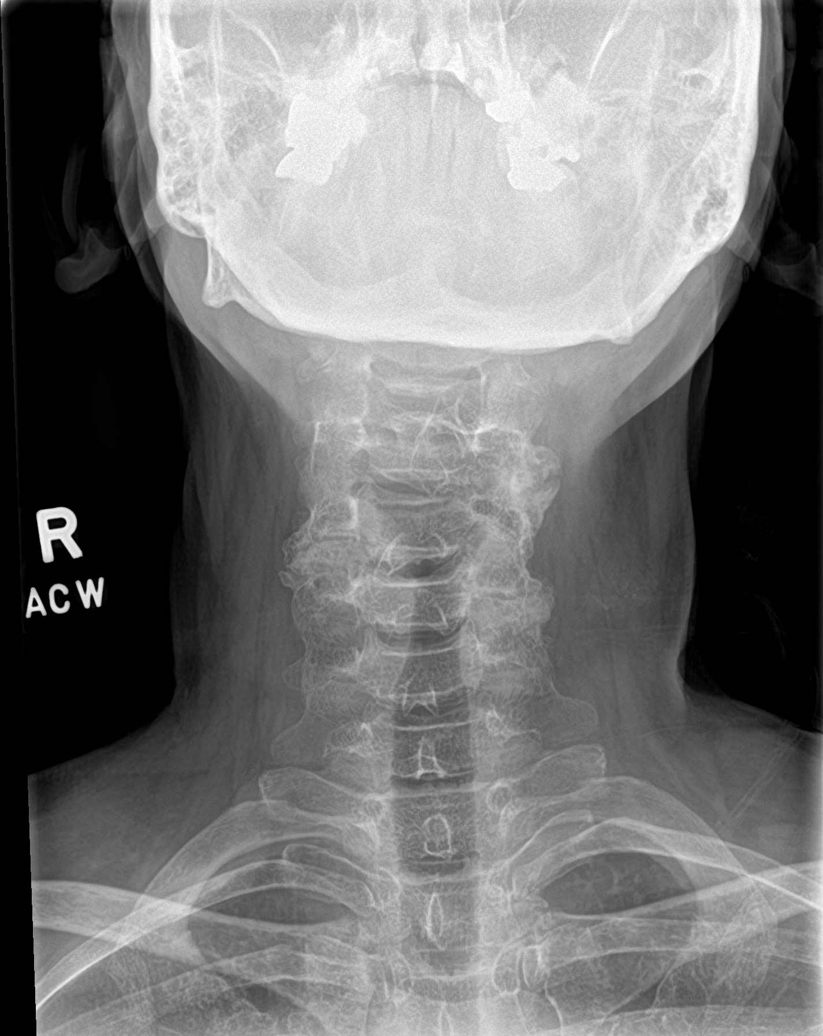

[[person_name]]
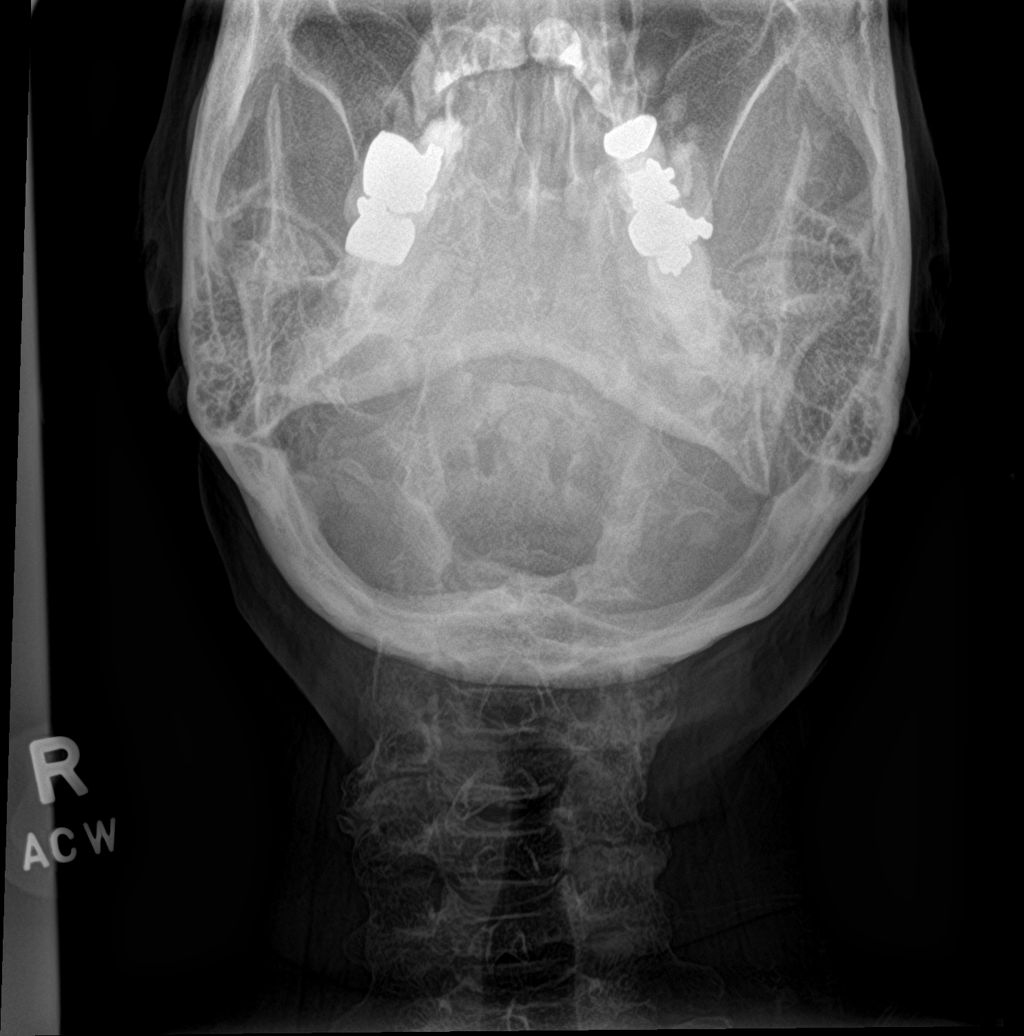

[c-spine open mouth]
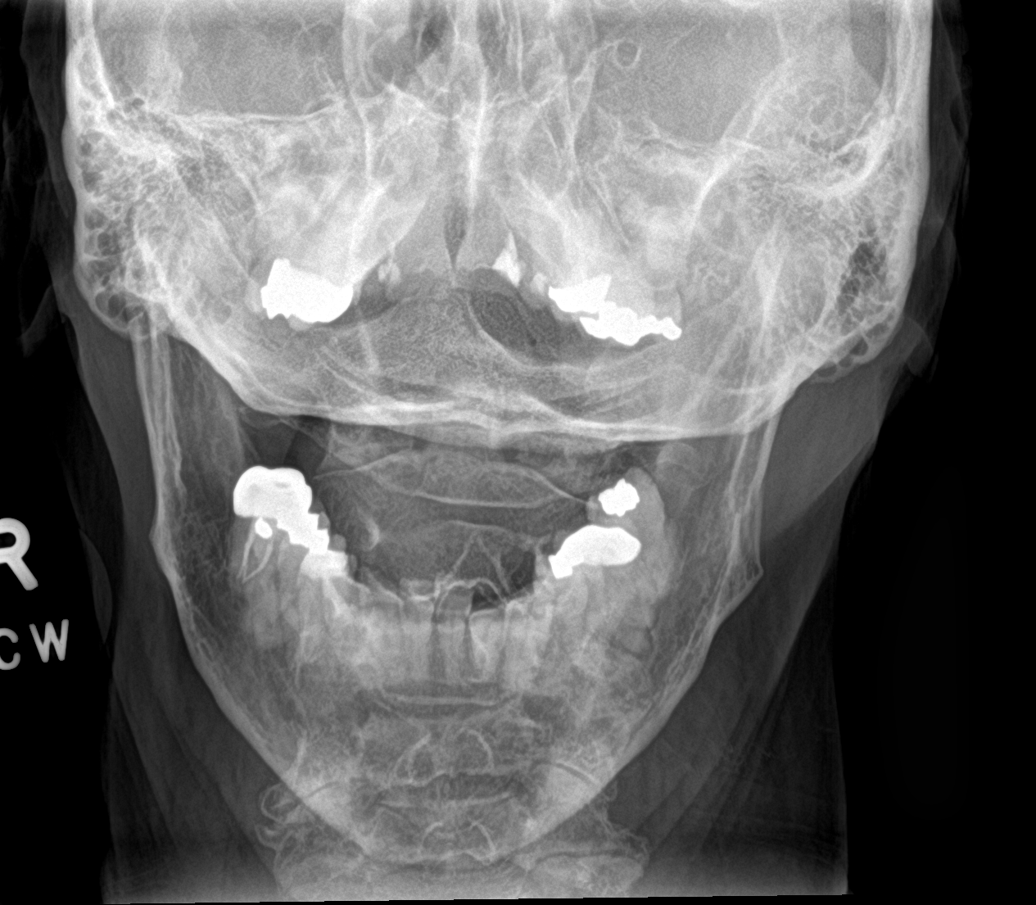

[6 of 6 positions shown; findings below may reference images not displayed]

FINDINGS: Normal alignment. No fracture. Prevertebral soft tissues are normal.
Moderate to advanced diffuse degenerative facet disease, left
greater than right. Left neural foraminal narrowing at C3-4. Right
neural foraminal narrowing at C4-5.
IMPRESSION: Degenerative facet disease.  No acute bony abnormality.

## 2021-09-27 MED ORDER — CYCLOBENZAPRINE HCL 5 MG PO TABS: 5.0000 mg | ORAL_TABLET | Freq: Three times a day (TID) | ORAL | 0 refills | Status: DC | PRN

## 2021-09-27 MED ORDER — NAPROXEN 500 MG PO TABS: 500.0000 mg | ORAL_TABLET | Freq: Two times a day (BID) | ORAL | 0 refills | Status: AC

## 2021-09-27 MED ORDER — CYCLOBENZAPRINE HCL 10 MG PO TABS
5.0000 mg | ORAL_TABLET | Freq: Once | ORAL | Status: AC
  Administered 2021-09-27: 5 mg via ORAL
  Filled 2021-09-27: qty 1

## 2021-09-27 MED ORDER — LIDOCAINE 5 % EX PTCH: 1.0000 | MEDICATED_PATCH | Freq: Two times a day (BID) | CUTANEOUS | 0 refills | Status: AC | PRN

## 2021-09-27 MED ORDER — NAPROXEN 500 MG PO TABS
500.0000 mg | ORAL_TABLET | Freq: Once | ORAL | Status: AC
  Administered 2021-09-27: 500 mg via ORAL
  Filled 2021-09-27: qty 1

## 2021-09-27 MED ORDER — LIDOCAINE 5 % EX PTCH
1.0000 | MEDICATED_PATCH | Freq: Once | CUTANEOUS | Status: DC
  Administered 2021-09-27: 1 via TRANSDERMAL
  Filled 2021-09-27: qty 1

## 2021-09-27 NOTE — Discharge Instructions (Signed)
Your exam and x-rays are normal and reassuring at this time.  No signs of a serious injury following your fall.  You should take the prescription medicine as directed and follow-up with your primary provider for ongoing symptoms. ?

## 2021-10-02 ENCOUNTER — Ambulatory Visit: Payer: PPO | Attending: Sports Medicine

## 2021-10-02 ENCOUNTER — Other Ambulatory Visit: Payer: Self-pay

## 2021-10-02 DIAGNOSIS — G8929 Other chronic pain: Secondary | ICD-10-CM | POA: Insufficient documentation

## 2021-10-02 DIAGNOSIS — M6281 Muscle weakness (generalized): Secondary | ICD-10-CM | POA: Insufficient documentation

## 2021-10-02 DIAGNOSIS — M25511 Pain in right shoulder: Secondary | ICD-10-CM | POA: Insufficient documentation

## 2021-10-02 NOTE — Patient Instructions (Signed)
Access Code: [email or web address] ?URL: [email or web address] ?Date: 10/02/2021 ?Prepared by: Joneen Boers ? ?Exercises ?Seated Scapular Retraction - 1 x daily - 7 x weekly - 3 sets - 10 reps - 5-15 seconds hold ?Sternocleidomastoid Stretch - 2 x daily - 7 x weekly - 1 sets - 5 reps - 10 seconds hold ?Isometric Shoulder Adduction - 1 x daily - 7 x weekly - 2-3 sets - 10 reps - 5 seconds hold ?Seated Shoulder Flexion AAROM with Pulley Behind - 1 x daily - 7 x weekly - 3 sets - 10 reps ?Supine Chin Tuck - 1 x daily - 7 x weekly - 3 sets - 10 reps ?Supine Shoulder Horizontal Abduction and Adduction - 1 x daily - 7 x weekly - 3 sets - 10 reps ?Single Arm Shoulder Extension with Anchored Resistance - 1 x daily - 7 x weekly - 3 sets - 10 reps ?Shoulder Adduction with Anchored Resistance - 1 x daily - 7 x weekly - 3 sets - 10 reps ? ?

## 2021-10-02 NOTE — Therapy (Signed)
[facility] [address] [place], [address] Phone: [phone]   Fax:  [phone]  Physical Therapy Treatment  Patient Details  Name: Lynn Mann MRN: [id number] Date of Birth: 08-18-1950 Referring Provider (PT): Rosalia Hammers, DO   Encounter Date: 10/02/2021   PT End of Session - 10/02/21 1406     Visit Number 29    Number of Visits 25    Date for PT Re-Evaluation 10/04/21    Authorization Type 9    Authorization Time Period 10    PT Start Time 1406    PT Stop Time 1501    PT Time Calculation (min) 55 min    Activity Tolerance Patient tolerated treatment well    Behavior During Therapy [facility] for tasks assessed/performed             Past Medical History:  Diagnosis Date   Arthritis    C. difficile diarrhea    age 19s-40s    Chicken pox    Cholecystitis 11/2011   Did not require sgy - Dr. Staci Acosta - [facility]  (cholelithiasis)   H/O Clostridium difficile infection    IBS (irritable bowel syndrome)    MRSA exposure 2005   Spider bite   MVP (mitral valve prolapse)    Stable - Dr. Ubaldo Glassing   Rheumatic fever     Past Surgical History:  Procedure Laterality Date   CATARACT EXTRACTION  [id number]   MUSCLE BIOPSY      There were no vitals filed for this visit.   Subjective Assessment - 10/02/21 1407     Subjective L side is improving after her fall 09/26/21. Able to walk fine. Pt landed onto her L knee and forearm. L ribs hurt the worst. The naprosyn and the muscle relaxer helped with the pain in addition to the lidocain patches. R collar bone felt like it was doing better at first but then it did a big pop/roll. No pain at rest, R shoulder joint symptoms when raising her arm up 4-5/10.    Pertinent History R shoulder did not start really hurting until about a month after her fall. Just started with a little pain at the [facility] joint and clavicle. Sometimes she also feels pain along her R pectoralis muscle  in the shape of a triangle. Pt fell June 2022. Pt got up to go to the bathroom and tripped over a suitcase. Pt fell against the garment rack on her R side which kind of shielded her R side. Pt also got a hematoma the size of a baked potato at her L leg afterwards. Pt hurt R toes during the fall which got better after 3 days. Went to urgent care for her leg. Hematoma has improved but still has a tiny knot in her L leg. Pt is R hand dominant. Pt was in PT around May to July 2022 for for her R hip which is better.    Patient Stated Goals Not hurt.    Currently in Pain? Yes    Pain Score 5     Pain Orientation Right    Pain Onset More than a month ago                                          There-ex:             R  shoulder flexion at start of session             136 degrees AROM    Standing R shoulder extension, yellow band attached at top of door             10x3             Improved R shoulder flexion AROM afterwards  Standing B shoulder ER yellow band 10x2    Standing R shoulder adduction yellow band 10x3   Made R clavicle feel better per pt.   Standing L cervical side bend to stretch R side. 15 seconds x 4 with PT assist to stabilize R first rib area  Standing R cervical side bend 10x5 seconds for 2 sets   Improved R shoulder flexion AROM to 145 degrees flexion afterwards  Seated gentle R cervical side bend and gentle R rotation 10x5 seconds for 2 sets   Improved R shoulder comfort, still has R sternoclavicular joint discomfort    Standing R shoulder adduction yellow band 10x3    Seated R shoulder assisted flexion to end range with PT manual resistance to extension 10x3         . R shoulder flexion AROM after session: 155 degrees     Improved exercise technique, movement at target joints, use of target muscles after mod verbal, visual, tactile cues.                   Response to treatment Improved R shoulder flexion AROM after session.       Clinical impression Improved R shoulder flexion AROM with treatment to improve R shoulder extension and adduction strength. Possible cervical disc involvement as well secondary to improved R shoulder flexion AROM with R cervical side bending. Pt able to achieve 155 degrees R shoulder flexion AROM standing, against gravity after session. Pt tolerated session well without aggravation of symptoms. Pt will benefit from continued skilled physical therapy services to decrease pain, improve strength and function.          PT Short Term Goals - 07/18/21 1111       PT SHORT TERM GOAL #1   Title Pt will be independent with her initial HEP to decrease pain, improve strength, function, and ability to reach, push, and pull more comfortably.    Baseline Pt has started her HEP (05/22/2021); Pt performing her HEP. Has some questions but forgot what they are (06/25/2021), No questions with HEP, doing them (07/18/2021)    Time 3    Period Weeks    Status Achieved    Target Date 06/14/21               PT Long Term Goals - 09/10/21 1423       PT LONG TERM GOAL #1   Title Pt will have a decrease in R shoulder pain to 3/10 or less at worst to promote ability to reach, open and close the door, lift items more comfortably.    Baseline 10/10 R shoulder pain at worst for the past 2 months (05/22/2021); 6/10 at worst for the past 7 days (06/25/2021); 9-10/10 at worst for the past 7 days (07/18/2021); (07/24/2021); 08/20/21: Worst pain reported at 8/10 NPS.  8/10 at worst for the past 7 days (sudden and sharp ) 09/10/2021)    Time 4    Period Weeks    Status On-going    Target Date 10/04/21      PT LONG TERM GOAL #2   Title  Pt will improve R shoulder flexion AROM to 150 degrees or more without pain to promote ability to reach.    Baseline 90 degrees R shoulder flexion AROM with pain (05/22/2021); 123 degrees flexion AROM, 147 degrees after resisted extension exercise (06/25/2021); 128 degrees  flexion,, 97 degrees abduction  (07/18/2021); 36 degrees flexion (07/24/2021); 08/20/21: 135 degrees; 127 degrees flexion AROM (09/10/2021)    Time 4    Period Weeks    Status On-going    Target Date 10/04/21      PT LONG TERM GOAL #3   Title Pt will improve her R shoulder strength all planes by at least 1/2 MMT grade to promote ability to reach as well as lift items.    Baseline R shoulder flexion 4/5, abduction 4-/5, ER 4+/5, IR 4/5 (05/22/2021); R shoulder flexion 4+/5, abduction 4/5, ER 4+/5, IR 4+/5 (06/25/2021); R shoulder flexion 4/5, abduction 4/5, ER 4/5, IR 5/5    Time 4    Period Weeks    Status On-going    Target Date 10/04/21      PT LONG TERM GOAL #4   Title Pt will improve R shoulder FOTO score by at least 10 points as a demonstration of improved function.    Baseline R shoulder FOTO 49 (05/22/2021); 59 (06/25/2021)    Time 8    Period Weeks    Status Achieved    Target Date 07/19/21                   Plan - 10/02/21 1519     Clinical Impression Statement Improved R shoulder flexion AROM with treatment to improve R shoulder extension and adduction strength. Possible cervical disc involvement as well secondary to improved R shoulder flexion AROM with R cervical side bending. Pt able to achieve 155 degrees R shoulder flexion AROM standing, against gravity after session. Pt tolerated session well without aggravation of symptoms. Pt will benefit from continued skilled physical therapy services to decrease pain, improve strength and function.    Personal Factors and Comorbidities Age;Comorbidity 2    Comorbidities Arthritis, Mitral valve prolapse    Examination-Activity Limitations Lift;Reach Overhead;Carry;Bed Mobility    Stability/Clinical Decision Making Evolving/Moderate complexity   inconsistent progress   Rehab Potential Fair    PT Frequency 2x / week    PT Duration 4 weeks    PT Treatment/Interventions Therapeutic activities;Therapeutic exercise;Manual  techniques;Electrical Stimulation;Iontophoresis '4mg'$ /ml Dexamethasone;Functional mobility training;Neuromuscular re-education;Patient/family education;Dry needling    PT Next Visit Plan Decreasing sternocleidomastiod muscle tension, improve scapular strength    PT Home Exercise Plan Medbridge Access Code [email or web address]    Consulted and Agree with Plan of Care Patient             Patient will benefit from skilled therapeutic intervention in order to improve the following deficits and impairments:  Pain, Improper body mechanics, Postural dysfunction, Decreased strength  Visit Diagnosis: Chronic right shoulder pain  Muscle weakness (generalized)     Problem List Patient Active Problem List   Diagnosis Date Noted   Cough 09/07/2021   Right shoulder pain 07/15/2021   Hematoma 02/11/2021   Ankle swelling 11/26/2020   Low back pain 11/22/2020   Hip pain, right 11/22/2020   Light headedness 06/24/2020   Hearing loss 06/24/2020   Change in vision 06/24/2020   Stress 05/17/2020   Chest pain 05/16/2020   Welcome to Medicare preventive visit 06/20/2019   Viral syndrome 01/30/2019   Itching 07/21/2017   Asthma 07/16/2016   Urinary incontinence 07/16/2016  SOB (shortness of breath) 04/01/2016   Bilateral shoulder pain 02/24/2016   Fatigue 01/28/2016   Neck fullness 01/28/2016   Routine general medical examination at a health care facility 07/25/2015   Health care maintenance 10/09/2014   Neck pain 11/26/2013   Hypercholesterolemia 11/26/2013   History of colonic polyps 05/11/2013   Hyperbilirubinemia 01/18/2013   GERD (gastroesophageal reflux disease) 12/03/2012   Cholelithiasis 11/07/2012   IBS (irritable bowel syndrome) 11/07/2012   History of rheumatic fever 08/28/2012   MVP (mitral valve prolapse) 08/28/2012   Joneen Boers DPT 10/02/2021, 3:23 PM  [facility] [address], [place], [address] Phone:  [phone]   Fax:  [phone]  Name: AGATHA DUPLECHAIN MRN: [id number] Date of Birth: 12/02/1950

## 2021-10-04 ENCOUNTER — Ambulatory Visit: Payer: PPO

## 2021-10-04 ENCOUNTER — Other Ambulatory Visit: Payer: Self-pay

## 2021-10-04 DIAGNOSIS — M25511 Pain in right shoulder: Secondary | ICD-10-CM | POA: Diagnosis not present

## 2021-10-04 DIAGNOSIS — G8929 Other chronic pain: Secondary | ICD-10-CM

## 2021-10-04 DIAGNOSIS — M6281 Muscle weakness (generalized): Secondary | ICD-10-CM

## 2021-10-04 NOTE — Therapy (Signed)
[facility] [address], [place], [address] Phone: [phone]   Fax:  [phone]  Physical Therapy Treatment And Progress Report (08/20/2021 - 10/04/2021)  Patient Details  Name: Lynn Mann MRN: [id number] Date of Birth: 03-11-1951 Referring Provider (PT): Rosalia Hammers, DO   Encounter Date: 10/04/2021   PT End of Session - 10/04/21 1602     Visit Number 30    Number of Visits 28    Date for PT Re-Evaluation 11/01/21    Authorization Type 10    Authorization Time Period 10    PT Start Time 1603    PT Stop Time 1654    PT Time Calculation (min) 51 min    Activity Tolerance Patient tolerated treatment well    Behavior During Therapy [facility] for tasks assessed/performed             Past Medical History:  Diagnosis Date   Arthritis    C. difficile diarrhea    age 62s-40s    Chicken pox    Cholecystitis 11/2011   Did not require sgy - Dr. Staci Acosta - [facility]  (cholelithiasis)   H/O Clostridium difficile infection    IBS (irritable bowel syndrome)    MRSA exposure 2005   Spider bite   MVP (mitral valve prolapse)    Stable - Dr. Ubaldo Glassing   Rheumatic fever     Past Surgical History:  Procedure Laterality Date   CATARACT EXTRACTION  [id number]   MUSCLE BIOPSY      There were no vitals filed for this visit.   Subjective Assessment - 10/04/21 1604     Subjective R calvicle is not doing too bad. Still not 100% but has not had terrible pain. L UE is messed up now, was ok at first. Hard to turn the steering wheel. No clavicle pain at rest. Feels like PT is helping her. The shoulder adduction exercise helped make her shoulder feel normal. Feels better to do what she used to do.    Pertinent History R shoulder did not start really hurting until about a month after her fall. Just started with a little pain at the [facility] joint and clavicle. Sometimes she also feels pain along her R pectoralis muscle in the shape  of a triangle. Pt fell June 2022. Pt got up to go to the bathroom and tripped over a suitcase. Pt fell against the garment rack on her R side which kind of shielded her R side. Pt also got a hematoma the size of a baked potato at her L leg afterwards. Pt hurt R toes during the fall which got better after 3 days. Went to urgent care for her leg. Hematoma has improved but still has a tiny knot in her L leg. Pt is R hand dominant. Pt was in PT around May to July 2022 for for her R hip which is better.    Patient Stated Goals Not hurt.    Currently in Pain? No/denies    Pain Onset More than a month ago                                       Therapeutic exercise:             R shoulder flexion at start of session             148 degrees  AROM    Manually resisted R shoulder flexion, abduction, ER, and IR 1x each way.    Standing R cervical side bend 10x5 seconds for 2 sets   Standing L cervical side bend 10x5 seconds for 3 sets  Improved comfort with shoulder flexion AROM  Cervical rotation   R 10x2  Increased difficulty with shoulder flexion AROM  Standing R shoulder adduction yellow band 10x3 with 5 second holds    Standing R shoulder extension, yellow band attached at top of door             10x with 5 second holds                  Seated R shoulder assisted flexion to end range with PT manual resistance to extension 10x3         .  Improved R shoulder flexion AROM to 160 degrees after session.   Reviewed plan of care: continue 2x/week for 4 more weeks to continue progress.     Improved exercise technique, movement at target joints, use of target muscles after mod verbal, visual, tactile cues.                   Response to treatment Improved R shoulder flexion AROM after session.      Clinical impression Pt demonstrates overall decreased R shoulder pain and improved R shoulder flexion AROM to 148 degrees today (was able to achieve 151 degrees  flexion after treatment last session). Able to achieve 160 degrees flexion AROM at end of session today especially after improving R posterior shoulder strength starting from end range flexion to 0 degrees flexion with isotonic/same manual resistance throughout the range.  Recent use of muscle relaxer medication that her doctor prescribed to her months ago may have also helped with progress. Pt demonstrates inconsistency with progress however but due to significant improvement in shoulder AROM as well as decreasing worst pain levels compared to previous measurements, pt demonstrates potential for further improvement and will benefit from continued skilled physical therapy services to maximize ROM, strength and function.         PT Short Term Goals - 07/18/21 1111       PT SHORT TERM GOAL #1   Title Pt will be independent with her initial HEP to decrease pain, improve strength, function, and ability to reach, push, and pull more comfortably.    Baseline Pt has started her HEP (05/22/2021); Pt performing her HEP. Has some questions but forgot what they are (06/25/2021), No questions with HEP, doing them (07/18/2021)    Time 3    Period Weeks    Status Achieved    Target Date 06/14/21               PT Long Term Goals - 10/04/21 1606       PT LONG TERM GOAL #1   Title Pt will have a decrease in R shoulder pain to 3/10 or less at worst to promote ability to reach, open and close the door, lift items more comfortably.    Baseline 10/10 R shoulder pain at worst for the past 2 months (05/22/2021); 6/10 at worst for the past 7 days (06/25/2021); 9-10/10 at worst for the past 7 days (07/18/2021); (07/24/2021); 08/20/21: Worst pain reported at 8/10 NPS.  8/10 at worst for the past 7 days (sudden and sharp ) 09/10/2021; 6-7/10 at most for the past 7 days. Did better when she took the muscle relaxers. Took muscle  relaxers last night. Knocks her out.  (10/04/2021)    Time 4    Period Weeks    Status  On-going    Target Date 11/01/21      PT LONG TERM GOAL #2   Title Pt will improve R shoulder flexion AROM to 150 degrees or more without pain to promote ability to reach.    Baseline 90 degrees R shoulder flexion AROM with pain (05/22/2021); 123 degrees flexion AROM, 147 degrees after resisted extension exercise (06/25/2021); 128 degrees flexion,, 97 degrees abduction  (07/18/2021); 36 degrees flexion (07/24/2021); 08/20/21: 135 degrees; 127 degrees flexion AROM (09/10/2021); 148 degrees flexion with just a touch of pain at the top (3/92023)    Time 4    Period Weeks    Status Partially Met    Target Date 11/01/21      PT LONG TERM GOAL #3   Title Pt will improve her R shoulder strength all planes by at least 1/2 MMT grade to promote ability to reach as well as lift items.    Baseline R shoulder flexion 4/5, abduction 4-/5, ER 4+/5, IR 4/5 (05/22/2021); R shoulder flexion 4+/5, abduction 4/5, ER 4+/5, IR 4+/5 (06/25/2021); R shoulder flexion 4/5, abduction 4/5, ER 4/5, IR 5/5; flexion 4+/5, abduction 4+/5, ER 4+/5, IR 5/5 (10/04/2021)    Time 4    Period Weeks    Status Partially Met    Target Date 11/01/21      PT LONG TERM GOAL #4   Title Pt will improve R shoulder FOTO score by at least 10 points as a demonstration of improved function.    Baseline R shoulder FOTO 49 (05/22/2021); 59 (06/25/2021); (10/04/2021)    Time 8    Period Weeks    Status Achieved    Target Date 07/19/21                   Plan - 10/04/21 1601     Clinical Impression Statement Pt demonstrates overall decreased R shoulder pain and improved R shoulder flexion AROM to 148 degrees today (was able to achieve 151 degrees flexion after treatment last session). Able to achieve 160 degrees flexion AROM at end of session today especially after improving R posterior shoulder strength starting from end range flexion to 0 degrees flexion with isotonic/same manual resistance throughout the range.  Recent use of muscle  relaxer medication that her doctor prescribed to her months ago may have also helped with progress. Pt demonstrates inconsistency with progress however but due to significant improvement in shoulder AROM as well as decreasing worst pain levels compared to previous measurements, pt demonstrates potential for further improvement and will benefit from continued skilled physical therapy services to maximize ROM, strength and function.    Personal Factors and Comorbidities Age;Comorbidity 2    Comorbidities Arthritis, Mitral valve prolapse    Examination-Activity Limitations Lift;Reach Overhead;Carry;Bed Mobility    Stability/Clinical Decision Making Stable/Uncomplicated   inconsistent progress   Clinical Decision Making Low    Rehab Potential Fair    PT Frequency 2x / week    PT Duration 4 weeks    PT Treatment/Interventions Therapeutic activities;Therapeutic exercise;Manual techniques;Electrical Stimulation;Iontophoresis 36m/ml Dexamethasone;Functional mobility training;Neuromuscular re-education;Patient/family education;Dry needling    PT Next Visit Plan Decreasing sternocleidomastiod muscle tension, improve scapular strength    PT Home Exercise Plan [place] Agree with Plan of Care Patient             Patient will  benefit from skilled therapeutic intervention in order to improve the following deficits and impairments:  Pain, Improper body mechanics, Postural dysfunction, Decreased strength  Visit Diagnosis: Chronic right shoulder pain - Plan: PT plan of care cert/re-cert  Muscle weakness (generalized) - Plan: PT plan of care cert/re-cert     Problem List Patient Active Problem List   Diagnosis Date Noted   Cough 09/07/2021   Right shoulder pain 07/15/2021   Hematoma 02/11/2021   Ankle swelling 11/26/2020   Low back pain 11/22/2020   Hip pain, right 11/22/2020   Light headedness 06/24/2020   Hearing loss 06/24/2020   Change in vision  06/24/2020   Stress 05/17/2020   Chest pain 05/16/2020   Welcome to Medicare preventive visit 06/20/2019   Viral syndrome 01/30/2019   Itching 07/21/2017   Asthma 07/16/2016   Urinary incontinence 07/16/2016   SOB (shortness of breath) 04/01/2016   Bilateral shoulder pain 02/24/2016   Fatigue 01/28/2016   Neck fullness 01/28/2016   Routine general medical examination at a health care facility 07/25/2015   Health care maintenance 10/09/2014   Neck pain 11/26/2013   Hypercholesterolemia 11/26/2013   History of colonic polyps 05/11/2013   Hyperbilirubinemia 01/18/2013   GERD (gastroesophageal reflux disease) 12/03/2012   Cholelithiasis 11/07/2012   IBS (irritable bowel syndrome) 11/07/2012   History of rheumatic fever 08/28/2012   MVP (mitral valve prolapse) 08/28/2012    Thank you for your referral.  Joneen Boers PT, DPT  10/04/2021, 5:21 PM  [facility] [address] [place], [address] Phone: [phone]   Fax:  [phone]  Name: JEANA KERSTING MRN: [id number] Date of Birth: [birth year]

## 2021-10-08 ENCOUNTER — Ambulatory Visit: Payer: PPO

## 2021-10-08 ENCOUNTER — Other Ambulatory Visit: Payer: Self-pay

## 2021-10-08 DIAGNOSIS — M6281 Muscle weakness (generalized): Secondary | ICD-10-CM

## 2021-10-08 DIAGNOSIS — M25511 Pain in right shoulder: Secondary | ICD-10-CM | POA: Diagnosis not present

## 2021-10-08 DIAGNOSIS — G8929 Other chronic pain: Secondary | ICD-10-CM

## 2021-10-08 NOTE — Therapy (Signed)
[facility] [address], [place], [address] Phone: [phone]   Fax:  [phone]  Physical Therapy Treatment  Patient Details  Name: Lynn Mann MRN: [id number] Date of Birth: [birth year] Referring Provider (PT): Rosalia Hammers, DO   Encounter Date: 10/08/2021   PT End of Session - 10/08/21 1507     Visit Number 31    Number of Visits 26    Date for PT Re-Evaluation 11/01/21    Authorization Type 1    Authorization Time Period 10    PT Start Time 1507    PT Stop Time 1546    PT Time Calculation (min) 39 min    Activity Tolerance Patient tolerated treatment well    Behavior During Therapy [facility] for tasks assessed/performed             Past Medical History:  Diagnosis Date   Arthritis    C. difficile diarrhea    age 36s-40s    Chicken pox    Cholecystitis 11/2011   Did not require sgy - Dr. Staci Acosta - [facility]  (cholelithiasis)   H/O Clostridium difficile infection    IBS (irritable bowel syndrome)    MRSA exposure 2005   Spider bite   MVP (mitral valve prolapse)    Stable - Dr. Ubaldo Glassing   Rheumatic fever     Past Surgical History:  Procedure Laterality Date   CATARACT EXTRACTION  [id number]   MUSCLE BIOPSY      There were no vitals filed for this visit.   Subjective Assessment - 10/08/21 1508     Subjective Has been feeling dizzy like she has vertigo, Feels like she is going to fall backwards if she shifts her body weight to her heels. The pharmacist told her to cut her muscle relaxers and naprosen in half.  R shoulder is feeling ok. 0.5/10 wiht shoulder flexion currently.    Pertinent History R shoulder did not start really hurting until about a month after her fall. Just started with a little pain at the [facility] joint and clavicle. Sometimes she also feels pain along her R pectoralis muscle in the shape of a triangle. Pt fell June 2022. Pt got up to go to the bathroom and tripped over a  suitcase. Pt fell against the garment rack on her R side which kind of shielded her R side. Pt also got a hematoma the size of a baked potato at her L leg afterwards. Pt hurt R toes during the fall which got better after 3 days. Went to urgent care for her leg. Hematoma has improved but still has a tiny knot in her L leg. Pt is R hand dominant. Pt was in PT around May to July 2022 for for her R hip which is better.    Patient Stated Goals Not hurt.    Currently in Pain? Yes    Pain Score 1     Pain Onset More than a month ago                                        PT Education - 10/08/21 1651     Education Details ther-ex    Person(s) Educated Patient    Methods Explanation;Demonstration;Tactile cues;Verbal cues    Comprehension Returned demonstration;Verbalized understanding              Therapeutic  exercise:             Blood pressure L arm sitting, mechanically taken, normal cuff. 138/72, HR 93 No dizziness currently per pt.   Cervical rotaiton   R: feels like pt is on a boat  L: normal   Cervical extension: no dizziness Cervical flexion: Pt feels like she is looking at the floor through glass.   Chin tucks 10x5 seconds for 3 sets  Seated manually resisted scapular retractoin isometrics, targeting the lower trap muscles to decrease upper trap muscle tension  L 10x5 seconds for 3 sets  R 10x5 seconds for 3 sets   Decreased symptoms of looking through glass with cervical flexion afterwrards                Seated R shoulder assisted flexion to end range with PT manual resistance to extension 10x    Improved exercise technique, movement at target joints, use of target muscles after mod verbal, visual, tactile cues.                 Manual therapy  Seated STM B cervical paraspinal and B upper trap muscles to decrease tension   Decreased vertigo symptoms with R cervical rotation      Response to treatment Improved R shoulder flexion  AROM after session.      Clinical impression Possible decreased symptoms of vertigo/dizziness with treatment to decrease R > L cervical paraspinal muscles and B upper trap L > R muscles. Decreased symptoms with cervical movement reported after session. Continued working on improving scapular strength and R posterior shoulder strength to promote ability to raise her R arm up with less clavicular pain. Pt tolerated session well without aggravation of symptoms. Pt will benefit from continued skilled physical therapy services to decrease pain, improve ROM, strength and function.        PT Short Term Goals - 07/18/21 1111       PT SHORT TERM GOAL #1   Title Pt will be independent with her initial HEP to decrease pain, improve strength, function, and ability to reach, push, and pull more comfortably.    Baseline Pt has started her HEP (05/22/2021); Pt performing her HEP. Has some questions but forgot what they are (06/25/2021), No questions with HEP, doing them (07/18/2021)    Time 3    Period Weeks    Status Achieved    Target Date 06/14/21               PT Long Term Goals - 10/04/21 1606       PT LONG TERM GOAL #1   Title Pt will have a decrease in R shoulder pain to 3/10 or less at worst to promote ability to reach, open and close the door, lift items more comfortably.    Baseline 10/10 R shoulder pain at worst for the past 2 months (05/22/2021); 6/10 at worst for the past 7 days (06/25/2021); 9-10/10 at worst for the past 7 days (07/18/2021); (07/24/2021); 08/20/21: Worst pain reported at 8/10 NPS.  8/10 at worst for the past 7 days (sudden and sharp ) 09/10/2021; 6-7/10 at most for the past 7 days. Did better when she took the muscle relaxers. Took muscle relaxers last night. Knocks her out.  (10/04/2021)    Time 4    Period Weeks    Status On-going    Target Date 11/01/21      PT LONG TERM GOAL #2   Title Pt will improve R shoulder  flexion AROM to 150 degrees or more without pain to  promote ability to reach.    Baseline 90 degrees R shoulder flexion AROM with pain (05/22/2021); 123 degrees flexion AROM, 147 degrees after resisted extension exercise (06/25/2021); 128 degrees flexion,, 97 degrees abduction  (07/18/2021); 36 degrees flexion (07/24/2021); 08/20/21: 135 degrees; 127 degrees flexion AROM (09/10/2021); 148 degrees flexion with just a touch of pain at the top (3/92023)    Time 4    Period Weeks    Status Partially Met    Target Date 11/01/21      PT LONG TERM GOAL #3   Title Pt will improve her R shoulder strength all planes by at least 1/2 MMT grade to promote ability to reach as well as lift items.    Baseline R shoulder flexion 4/5, abduction 4-/5, ER 4+/5, IR 4/5 (05/22/2021); R shoulder flexion 4+/5, abduction 4/5, ER 4+/5, IR 4+/5 (06/25/2021); R shoulder flexion 4/5, abduction 4/5, ER 4/5, IR 5/5; flexion 4+/5, abduction 4+/5, ER 4+/5, IR 5/5 (10/04/2021)    Time 4    Period Weeks    Status Partially Met    Target Date 11/01/21      PT LONG TERM GOAL #4   Title Pt will improve R shoulder FOTO score by at least 10 points as a demonstration of improved function.    Baseline R shoulder FOTO 49 (05/22/2021); 59 (06/25/2021); (10/04/2021)    Time 8    Period Weeks    Status Achieved    Target Date 07/19/21                   Plan - 10/08/21 1651     Clinical Impression Statement Possible decreased symptoms of vertigo/dizziness with treatment to decrease R > L cervical paraspinal muscles and B upper trap L > R muscles. Decreased symptoms with cervical movement reported after session. Continued working on improving scapular strength and R posterior shoulder strength to promote ability to raise her R arm up with less clavicular pain. Pt tolerated session well without aggravation of symptoms. Pt will benefit from continued skilled physical therapy services to decrease pain, improve ROM, strength and function.    Personal Factors and Comorbidities  Age;Comorbidity 2    Comorbidities Arthritis, Mitral valve prolapse    Examination-Activity Limitations Lift;Reach Overhead;Carry;Bed Mobility    Stability/Clinical Decision Making Stable/Uncomplicated   inconsistent progress   Rehab Potential Fair    PT Frequency 2x / week    PT Duration 4 weeks    PT Treatment/Interventions Therapeutic activities;Therapeutic exercise;Manual techniques;Electrical Stimulation;Iontophoresis 90m/ml Dexamethasone;Functional mobility training;Neuromuscular re-education;Patient/family education;Dry needling    PT Next Visit Plan Decreasing sternocleidomastiod muscle tension, improve scapular strength    PT Home Exercise Plan Medbridge Access Code [email or web address]    Consulted and Agree with Plan of Care Patient             Patient will benefit from skilled therapeutic intervention in order to improve the following deficits and impairments:  Pain, Improper body mechanics, Postural dysfunction, Decreased strength  Visit Diagnosis: Chronic right shoulder pain  Muscle weakness (generalized)     Problem List Patient Active Problem List   Diagnosis Date Noted   Cough 09/07/2021   Right shoulder pain 07/15/2021   Hematoma 02/11/2021   Ankle swelling 11/26/2020   Low back pain 11/22/2020   Hip pain, right 11/22/2020   Light headedness 06/24/2020   Hearing loss 06/24/2020   Change in vision 06/24/2020   Stress 05/17/2020   Chest pain  05/16/2020   Welcome to Medicare preventive visit 06/20/2019   Viral syndrome 01/30/2019   Itching 07/21/2017   Asthma 07/16/2016   Urinary incontinence 07/16/2016   SOB (shortness of breath) 04/01/2016   Bilateral shoulder pain 02/24/2016   Fatigue 01/28/2016   Neck fullness 01/28/2016   Routine general medical examination at a health care facility 07/25/2015   Health care maintenance 10/09/2014   Neck pain 11/26/2013   Hypercholesterolemia 11/26/2013   History of colonic polyps 05/11/2013   Hyperbilirubinemia  01/18/2013   GERD (gastroesophageal reflux disease) 12/03/2012   Cholelithiasis 11/07/2012   IBS (irritable bowel syndrome) 11/07/2012   History of rheumatic fever 08/28/2012   MVP (mitral valve prolapse) 08/28/2012   Joneen Boers PT, DPT  10/08/2021, 4:58 PM  [facility] [address] [place], [address] Phone: [phone]   Fax:  [phone]  Name: Lynn Mann MRN: [id number] Date of Birth: [birth year]

## 2021-10-09 ENCOUNTER — Ambulatory Visit
Admission: RE | Admit: 2021-10-09 | Discharge: 2021-10-09 | Disposition: A | Discharge status: Home / Self Care | Payer: PPO | Source: Ambulatory Visit | Attending: Internal Medicine | Admitting: Internal Medicine

## 2021-10-09 DIAGNOSIS — R9389 Abnormal findings on diagnostic imaging of other specified body structures: Secondary | ICD-10-CM | POA: Insufficient documentation

## 2021-10-09 DIAGNOSIS — I2510 Atherosclerotic heart disease of native coronary artery without angina pectoris: Secondary | ICD-10-CM | POA: Diagnosis not present

## 2021-10-09 DIAGNOSIS — K449 Diaphragmatic hernia without obstruction or gangrene: Secondary | ICD-10-CM | POA: Diagnosis not present

## 2021-10-09 DIAGNOSIS — J849 Interstitial pulmonary disease, unspecified: Secondary | ICD-10-CM | POA: Diagnosis not present

## 2021-10-09 DIAGNOSIS — I700 Atherosclerosis of aorta: Secondary | ICD-10-CM | POA: Diagnosis not present

## 2021-10-09 IMAGING — CT CT CHEST HIGH RESOLUTION
2 of 7 series · 14 of 36 positions shown, 17 images · non-contrast
Comparison: [DATE].

CLINICAL DATA: Interstitial lung disease.



[Series 5: high resolution retro · axial · 0.59mm/px · z∈[+969,+1228]mm · 11 of 311 slices shown, 14 images]
[im 26/311  mediastinal]
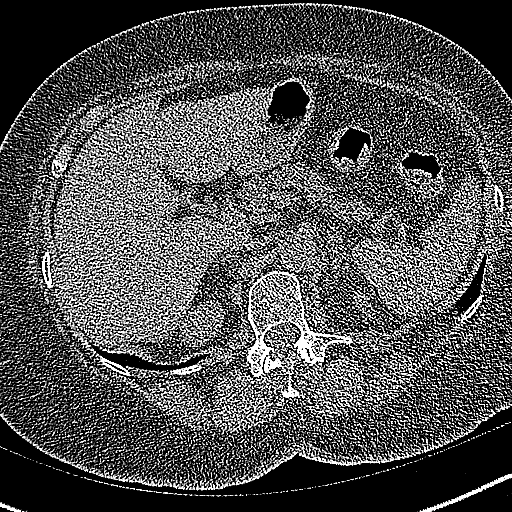
[im 26/311  lung]
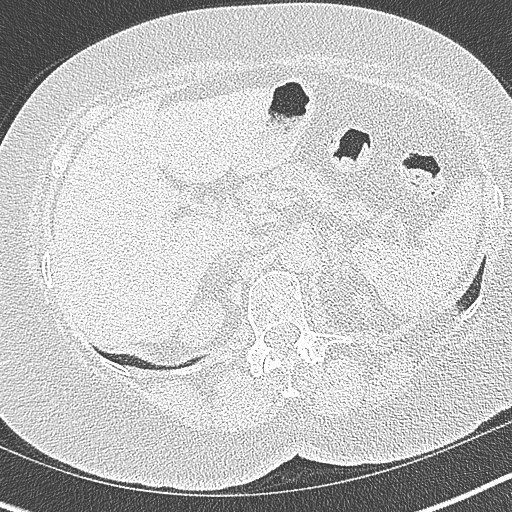
[im 52/311  lung]
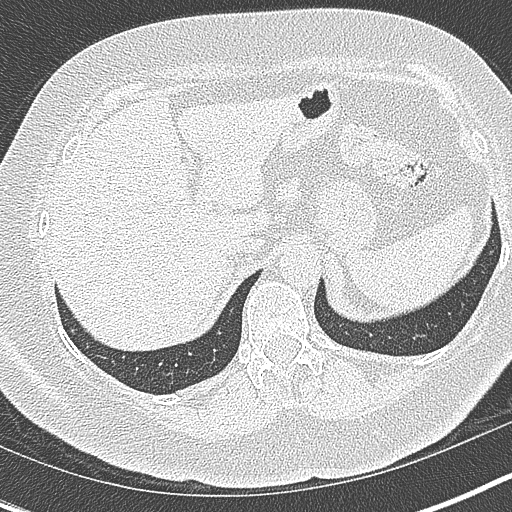
[im 78/311  lung]
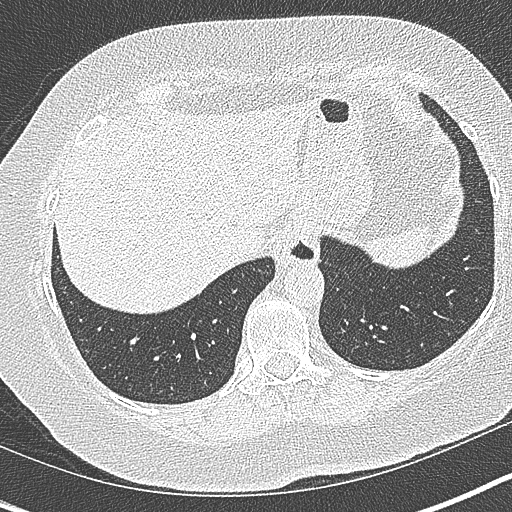
[im 104/311  lung]
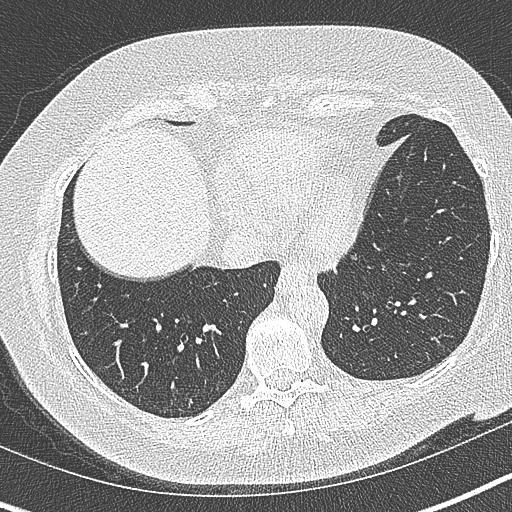
[im 130/311  mediastinal]
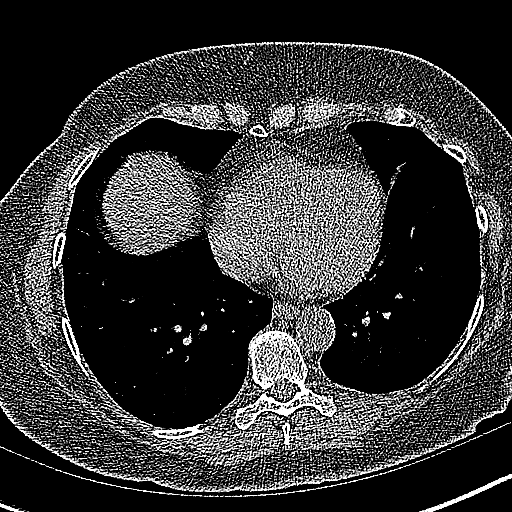
[im 130/311  lung]
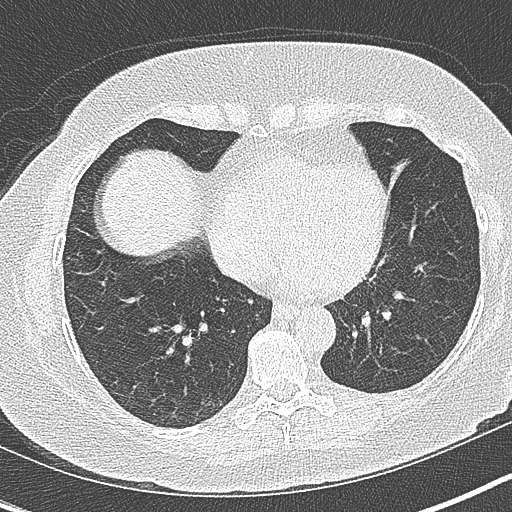
[im 156/311  lung]
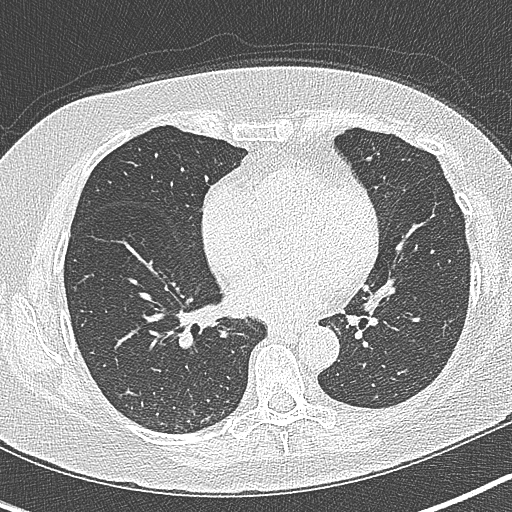
[im 181/311  lung]
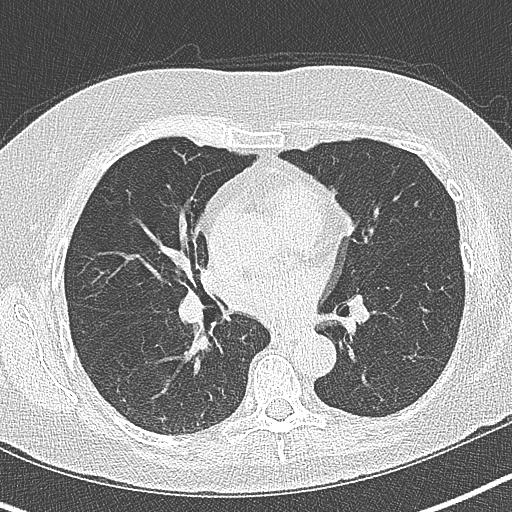
[im 207/311  lung]
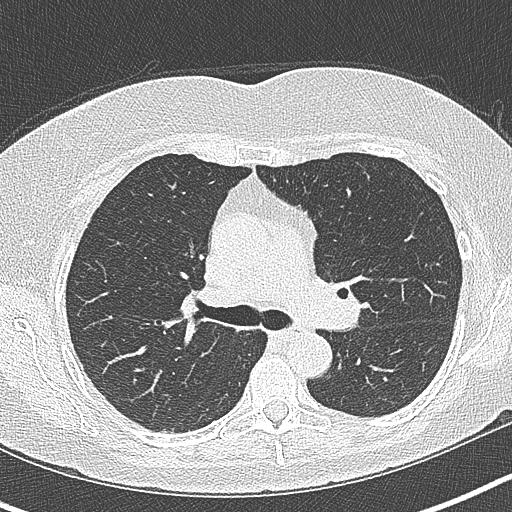
[im 233/311  mediastinal]
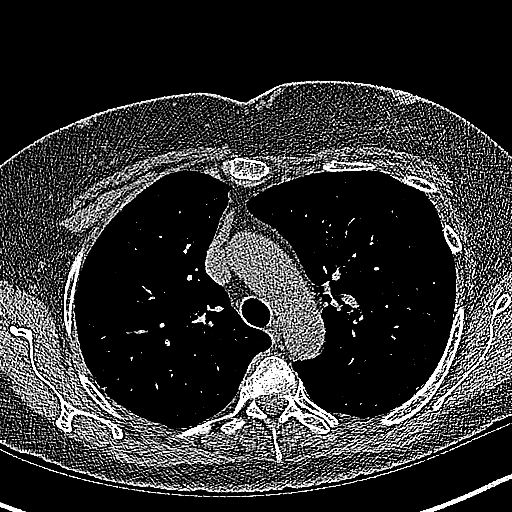
[im 233/311  lung]
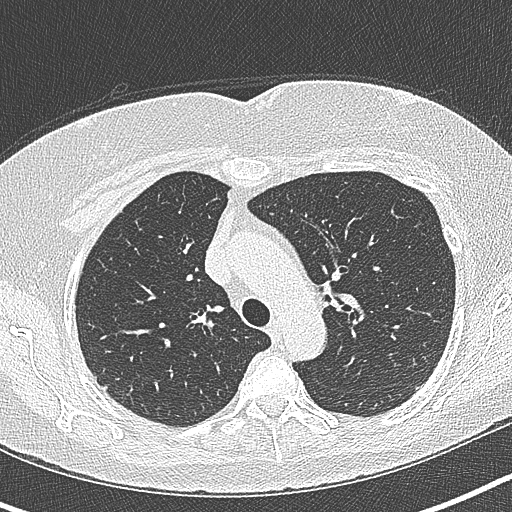
[im 259/311  lung]
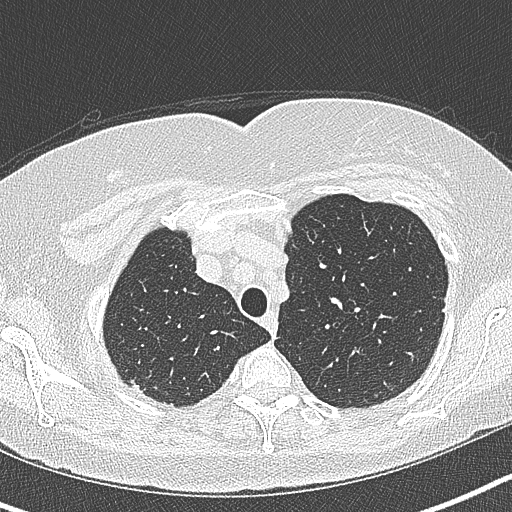
[im 285/311  lung]
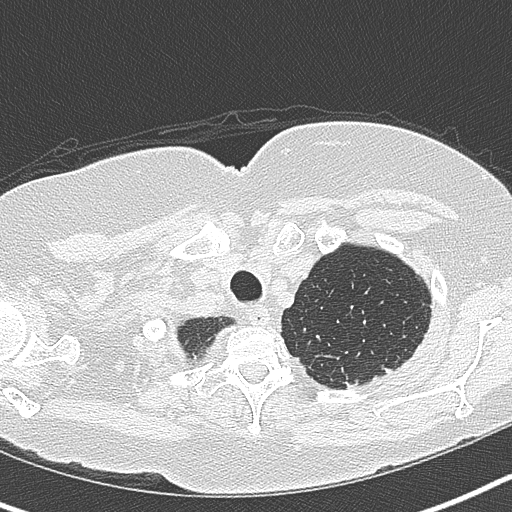

[Series 8: coronal · coronal · 0.61mm/px · 3 of 78 slices shown]
[im 16/78  lung]
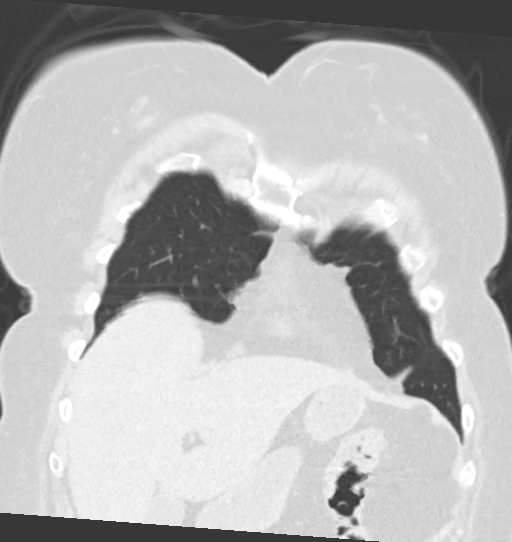
[im 31/78  lung]
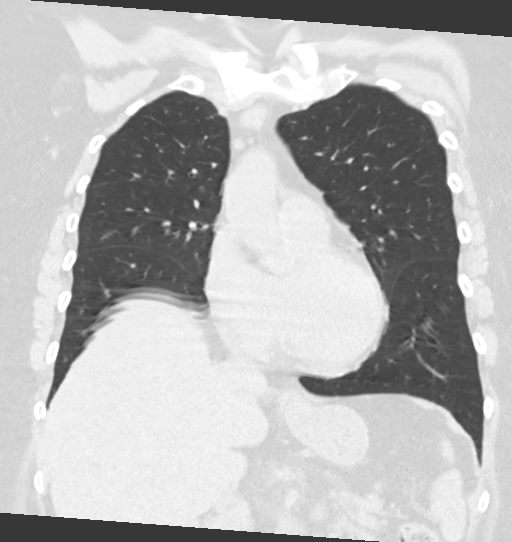
[im 47/78  lung]
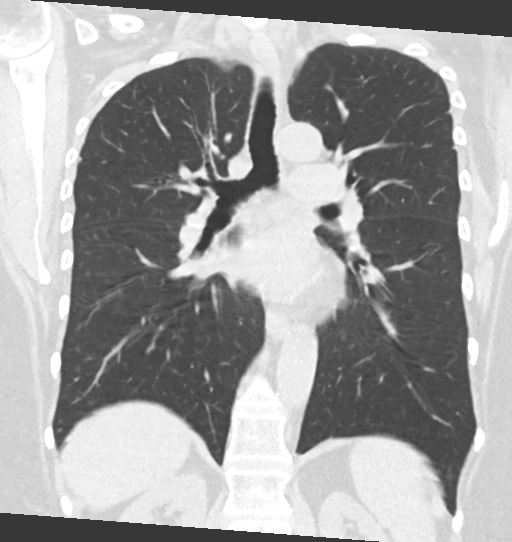

[14 of 36 positions shown; findings below may reference images not displayed]

FINDINGS: Cardiovascular: Atherosclerotic calcification of the aorta, aortic
valve and coronary arteries. Heart size normal. No pericardial
effusion.

Mediastinum/Nodes: No pathologically enlarged mediastinal or
axillary lymph nodes. Hilar regions are difficult to definitively
evaluate without IV contrast. Esophagus is grossly unremarkable.
Small hiatal hernia.

Lungs/Pleura: Mild subpleural scarring in the upper lobes. Negative
for subpleural reticulation, traction
bronchiectasis/bronchiolectasis, ground-glass, architectural
distortion or honeycombing. No suspicious pulmonary nodules. 5 mm
ground-glass nodule in the posterior left lower lobe (3/109),
unchanged and considered benign. No pleural fluid. Airway is
unremarkable. No air trapping.

Upper Abdomen: Visualized portions of the liver and gallbladder are
unremarkable. Mild thickening of the adrenal glands bilaterally.
Visualized portions of the kidneys, spleen, pancreas, stomach and
bowel are unremarkable with the exception of an aforementioned small
hiatal hernia. No upper abdominal adenopathy.

Musculoskeletal: Degenerative changes in the spine. No worrisome
lytic or sclerotic lesions.
IMPRESSION: 1. No evidence of interstitial lung disease.
2. Aortic atherosclerosis ([PX]-[PX]). Coronary artery
calcification.

## 2021-10-10 ENCOUNTER — Other Ambulatory Visit: Payer: Self-pay

## 2021-10-10 ENCOUNTER — Ambulatory Visit: Payer: PPO

## 2021-10-10 DIAGNOSIS — M6281 Muscle weakness (generalized): Secondary | ICD-10-CM

## 2021-10-10 DIAGNOSIS — G8929 Other chronic pain: Secondary | ICD-10-CM

## 2021-10-10 DIAGNOSIS — M25511 Pain in right shoulder: Secondary | ICD-10-CM | POA: Diagnosis not present

## 2021-10-10 NOTE — Therapy (Signed)
[facility] ?[address] ?[place], [place], [address] ?Phone: [phone]   Fax:  [phone] ? ?Physical Therapy Treatment ? ?Patient Details  ?Name: Lynn Mann ?MRN: [id number] ?Date of Birth: 19-Sep-1950 ?Referring Provider (PT): Rosalia Hammers, DO ? ? ?Encounter Date: 10/10/2021 ? ? PT End of Session - 10/10/21 1411   ? ? Visit Number 32   ? Number of Visits 45   ? Date for PT Re-Evaluation 11/01/21   ? Authorization Type 2   ? Authorization Time Period 10   ? PT Start Time 1414   ? PT Stop Time 2542   ? PT Time Calculation (min) 45 min   ? Activity Tolerance Patient tolerated treatment well   ? Behavior During Therapy [facility] for tasks assessed/performed   ? ?  ?  ? ?  ? ? ?Past Medical History:  ?Diagnosis Date  ? Arthritis   ? C. difficile diarrhea   ? age 53s-40s   ? Chicken pox   ? Cholecystitis 11/2011  ? Did not require sgy - Dr. Staci Acosta - [facility]  (cholelithiasis)  ? H/O Clostridium difficile infection   ? IBS (irritable bowel syndrome)   ? MRSA exposure 2005  ? Spider bite  ? MVP (mitral valve prolapse)   ? Stable - Dr. Ubaldo Glassing  ? Rheumatic fever   ? ? ?Past Surgical History:  ?Procedure Laterality Date  ? CATARACT EXTRACTION  [id number]  ? MUSCLE BIOPSY    ? ? ?There were no vitals filed for this visit. ? ? Subjective Assessment - 10/10/21 1417   ? ? Subjective Pt reports dizziness has improved but still present. Reports dizziness did occur when laying down during CT scan. No pain in R shoulder, did have some pain earlier today "wiggling" her R shoulder.   ? Pertinent History R shoulder did not start really hurting until about a month after her fall. Just started with a little pain at the [facility] joint and clavicle. Sometimes she also feels pain along her R pectoralis muscle in the shape of a triangle. Pt fell June 2022. Pt got up to go to the bathroom and tripped over a suitcase. Pt fell against the garment rack on her R side which kind of shielded  her R side. Pt also got a hematoma the size of a baked potato at her L leg afterwards. Pt hurt R toes during the fall which got better after 3 days. Went to urgent care for her leg. Hematoma has improved but still has a tiny knot in her L leg. Pt is R hand dominant. Pt was in PT around May to July 2022 for for her R hip which is better.   ? Patient Stated Goals Not hurt.   ? Currently in Pain? No/denies   ? Pain Onset More than a month ago   ? ?  ?  ? ?  ? ?Manual Therapy: 20 min pt in supine ?STM to B cervical paraspinals, R pec major at proximal portion near GHJ, and R upper traps to reduce muscle tension and R shoulder pain.  ? ?R/L upper trap stretch + contralateral cervical rotation: 3x30 sec/side ? ? ?There.ex: ?Supine chest press R/L: R side up to 6/10 pain NPS at top of motion and at resting position, 2x10 no resistance in pain free ranges. Discontinued.  ? ?Seated chest fly (adduction): no resistance, elbows flexed (short lever arm) 2x10 from 45 deg abduction to hands touching together in  front of body:  ? ?Seated Cervical retractions: 3x10, 5 sec holds  ? ?Seated scap retractions with GTB: 2x10, good form/technique ? ? PT Education - 10/10/21 1410   ? ? Education Details form/technique with exercise   ? Person(s) Educated Patient   ? Methods Explanation;Demonstration;Tactile cues;Verbal cues   ? Comprehension Verbalized understanding;Returned demonstration   ? ?  ?  ? ?  ? ? ? PT Short Term Goals - 07/18/21 1111   ? ?  ? PT SHORT TERM GOAL #1  ? Title Pt will be independent with her initial HEP to decrease pain, improve strength, function, and ability to reach, push, and pull more comfortably.   ? Baseline Pt has started her HEP (05/22/2021); Pt performing her HEP. Has some questions but forgot what they are (06/25/2021), No questions with HEP, doing them (07/18/2021)   ? Time 3   ? Period Weeks   ? Status Achieved   ? Target Date 06/14/21   ? ?  ?  ? ?  ? ? ? ? PT Long Term Goals - 10/04/21 1606   ? ?  ?  PT LONG TERM GOAL #1  ? Title Pt will have a decrease in R shoulder pain to 3/10 or less at worst to promote ability to reach, open and close the door, lift items more comfortably.   ? Baseline 10/10 R shoulder pain at worst for the past 2 months (05/22/2021); 6/10 at worst for the past 7 days (06/25/2021); 9-10/10 at worst for the past 7 days (07/18/2021); (07/24/2021); 08/20/21: Worst pain reported at 8/10 NPS.  8/10 at worst for the past 7 days (sudden and sharp ) 09/10/2021; 6-7/10 at most for the past 7 days. Did better when she took the muscle relaxers. Took muscle relaxers last night. Knocks her out.  (10/04/2021)   ? Time 4   ? Period Weeks   ? Status On-going   ? Target Date 11/01/21   ?  ? PT LONG TERM GOAL #2  ? Title Pt will improve R shoulder flexion AROM to 150 degrees or more without pain to promote ability to reach.   ? Baseline 90 degrees R shoulder flexion AROM with pain (05/22/2021); 123 degrees flexion AROM, 147 degrees after resisted extension exercise (06/25/2021); 128 degrees flexion,, 97 degrees abduction  (07/18/2021); 36 degrees flexion (07/24/2021); 08/20/21: 135 degrees; 127 degrees flexion AROM (09/10/2021); 148 degrees flexion with just a touch of pain at the top (3/92023)   ? Time 4   ? Period Weeks   ? Status Partially Met   ? Target Date 11/01/21   ?  ? PT LONG TERM GOAL #3  ? Title Pt will improve her R shoulder strength all planes by at least 1/2 MMT grade to promote ability to reach as well as lift items.   ? Baseline R shoulder flexion 4/5, abduction 4-/5, ER 4+/5, IR 4/5 (05/22/2021); R shoulder flexion 4+/5, abduction 4/5, ER 4+/5, IR 4+/5 (06/25/2021); R shoulder flexion 4/5, abduction 4/5, ER 4/5, IR 5/5; flexion 4+/5, abduction 4+/5, ER 4+/5, IR 5/5 (10/04/2021)   ? Time 4   ? Period Weeks   ? Status Partially Met   ? Target Date 11/01/21   ?  ? PT LONG TERM GOAL #4  ? Title Pt will improve R shoulder FOTO score by at least 10 points as a demonstration of improved function.   ?  Baseline R shoulder FOTO 49 (05/22/2021); 59 (06/25/2021); (10/04/2021)   ? Time 8   ?  Period Weeks   ? Status Achieved   ? Target Date 07/19/21   ? ?  ?  ? ?  ? ? ? ? ? ? ? ? Plan - 10/10/21 1459   ? ? Clinical Impression Statement Pt educated on symptoms consistent with BPPV with reports of positional dizziness during session and also at home reports. Pt educated on getting appointment for ENT or having PT screen for treatable dizziness. Otherwise continuing PT POC with focus on improving pain and postural strength of R shoulder. Pt still displaying consistently high pain levels up to 6/10 NPS with R shoulder adduction and other overhead motions. Attempts made to improve pain with short lever arm positions with fair improvement. Pt will continue to benefit from skilled PT services to improve pain and functional mobility of R shoulder.   ? Personal Factors and Comorbidities Age;Comorbidity 2   ? Comorbidities Arthritis, Mitral valve prolapse   ? Examination-Activity Limitations Lift;Reach Overhead;Carry;Bed Mobility   ? Stability/Clinical Decision Making Stable/Uncomplicated   inconsistent progress  ? Rehab Potential Fair   ? PT Frequency 2x / week   ? PT Duration 4 weeks   ? PT Treatment/Interventions Therapeutic activities;Therapeutic exercise;Manual techniques;Electrical Stimulation;Iontophoresis 61m/ml Dexamethasone;Functional mobility training;Neuromuscular re-education;Patient/family education;Dry needling   ? PT Next Visit Plan Decreasing sternocleidomastiod muscle tension, improve scapular strength   ? [place]  ? Consulted and Agree with Plan of Care Patient   ? ?  ?  ? ?  ? ? ?Patient will benefit from skilled therapeutic intervention in order to improve the following deficits and impairments:  Pain, Improper body mechanics, Postural dysfunction, Decreased strength ? ?Visit Diagnosis: ?Chronic right shoulder pain ? ?Muscle weakness (generalized) ? ? ? ? ?Problem  List ?Patient Active Problem List  ? Diagnosis Date Noted  ? Cough 09/07/2021  ? Right shoulder pain 07/15/2021  ? Hematoma 02/11/2021  ? Ankle swelling 11/26/2020  ? Low back pain 11/22/2020  ? Hip pain,

## 2021-10-16 ENCOUNTER — Other Ambulatory Visit: Payer: Self-pay

## 2021-10-16 ENCOUNTER — Ambulatory Visit: Payer: PPO

## 2021-10-16 DIAGNOSIS — G8929 Other chronic pain: Secondary | ICD-10-CM

## 2021-10-16 DIAGNOSIS — M6281 Muscle weakness (generalized): Secondary | ICD-10-CM

## 2021-10-16 DIAGNOSIS — M25511 Pain in right shoulder: Secondary | ICD-10-CM | POA: Diagnosis not present

## 2021-10-16 NOTE — Patient Instructions (Signed)
Access Code: [email or web address] ?URL: [email or web address] ?Date: 10/16/2021 ?Prepared by: Joneen Boers ? ?Exercises ?Seated Scapular Retraction - 1 x daily - 7 x weekly - 3 sets - 10 reps - 5-15 seconds hold ?Sternocleidomastoid Stretch - 2 x daily - 7 x weekly - 1 sets - 5 reps - 10 seconds hold ?Isometric Shoulder Adduction - 1 x daily - 7 x weekly - 2-3 sets - 10 reps - 5 seconds hold ?Seated Shoulder Flexion AAROM with Pulley Behind - 1 x daily - 7 x weekly - 3 sets - 10 reps ?Supine Chin Tuck - 1 x daily - 7 x weekly - 3 sets - 10 reps ?Supine Shoulder Horizontal Abduction and Adduction - 1 x daily - 7 x weekly - 3 sets - 10 reps ?Single Arm Shoulder Extension with Anchored Resistance - 1 x daily - 7 x weekly - 3 sets - 10 reps ?Shoulder Adduction with Anchored Resistance - 1 x daily - 7 x weekly - 3 sets - 10 reps ? ?

## 2021-10-16 NOTE — Therapy (Signed)
? ?OUTPATIENT PHYSICAL THERAPY TREATMENT NOTE ? ? ?Patient Name: Lynn Mann ?MRN: [id number] ?DOB:March 08, 1951, 71 y.o., female ?Today's Date: 10/16/2021 ? ?PCP: Einar Pheasant, MD ?REFERRING PROVIDER: Diamond Nickel, DO ? ? PT End of Session - 10/16/21 1633   ? ? Visit Number [id number]   ? Number of Visits 45   ? Date for PT Re-Evaluation 11/01/21   ? Authorization Type 3   ? Authorization Time Period 10   ? PT Start Time 0938   ? PT Stop Time 1829   ? PT Time Calculation (min) 45 min   ? Activity Tolerance Patient tolerated treatment well   ? Behavior During Therapy [facility] for tasks assessed/performed   ? ?  ?  ? ?  ? ? ?Past Medical History:  ?Diagnosis Date  ? Arthritis   ? C. difficile diarrhea   ? age 76s-40s   ? Chicken pox   ? Cholecystitis 11/2011  ? Did not require sgy - Dr. Staci Acosta - [facility]  (cholelithiasis)  ? H/O Clostridium difficile infection   ? IBS (irritable bowel syndrome)   ? MRSA exposure 2005  ? Spider bite  ? MVP (mitral valve prolapse)   ? Stable - Dr. Ubaldo Glassing  ? Rheumatic fever   ? ?Past Surgical History:  ?Procedure Laterality Date  ? CATARACT EXTRACTION  [id number]  ? MUSCLE BIOPSY    ? ?Patient Active Problem List  ? Diagnosis Date Noted  ? Cough 09/07/2021  ? Right shoulder pain 07/15/2021  ? Hematoma 02/11/2021  ? Ankle swelling 11/26/2020  ? Low back pain 11/22/2020  ? Hip pain, right 11/22/2020  ? Light headedness 06/24/2020  ? Hearing loss 06/24/2020  ? Change in vision 06/24/2020  ? Stress 05/17/2020  ? Chest pain 05/16/2020  ? Welcome to Medicare preventive visit 06/20/2019  ? Viral syndrome 01/30/2019  ? Itching 07/21/2017  ? Asthma 07/16/2016  ? Urinary incontinence 07/16/2016  ? SOB (shortness of breath) 04/01/2016  ? Bilateral shoulder pain 02/24/2016  ? Fatigue 01/28/2016  ? Neck fullness 01/28/2016  ? Routine general medical examination at a health care facility 07/25/2015  ? Health care maintenance 10/09/2014  ? Neck pain 11/26/2013  ? Hypercholesterolemia 11/26/2013  ? History of  colonic polyps 05/11/2013  ? Hyperbilirubinemia 01/18/2013  ? GERD (gastroesophageal reflux disease) 12/03/2012  ? Cholelithiasis 11/07/2012  ? IBS (irritable bowel syndrome) 11/07/2012  ? History of rheumatic fever 08/28/2012  ? MVP (mitral valve prolapse) 08/28/2012  ? ? ?REFERRING DIAG: Acute pain of R shoulder, Fall, anterior subluxation of sternoclavicular joint, arthritis of R sternoclavicular joint ? ?THERAPY DIAG:  ?Chronic right shoulder pain ? ?Muscle weakness (generalized) ? ?PERTINENT HISTORY: R shoulder did not start really hurting until about a month after her fall. Just started with a little pain at the [facility] joint and clavicle. Sometimes she also feels pain along her R pectoralis muscle in the shape of a triangle. Pt fell June 2022. Pt got up to go to the bathroom and tripped over a suitcase. Pt fell against the garment rack on her R side which kind of shielded her R side. Pt also got a hematoma the size of a baked potato at her L leg afterwards. Pt hurt R toes during the fall which got better after 3 days. Went to urgent care for her leg. Hematoma has improved but still has a tiny knot in her L leg. Pt is R hand dominant. Pt was in PT around May to July 2022 for for  her R hip which is better. ? ?PRECAUTIONS: possible fall risk ? ?SUBJECTIVE: Still a little dizzy, not constant. Had a few sudden 6/10s which eased off quickly and did not happen as often. No pain currently. Hurt really bad after last session but it helpd her shoulders relax better. Was not able to get an appointment with the [facility] for dizziness yet. Floor did not feel level at start of session, slight not steady. ? ?PAIN:  ?Are you having pain? No ? ? ? ? ?Objectives ? ?10/16/2021 ?Therapeutic exercise:  ?    ?Seated chest fly (adduction): no resistance, elbows flexed (short lever arm) 2x10 from 45 deg abduction to hands touching together in front of body ?  ?Seated Cervical retractions: 3x10, 5 sec holds  ?  ?Seated scap retractions with  GTB: 3x10,  ? ?              ?Seated R shoulder assisted flexion to end range with PT manual resistance to extension 10x3 ? ?Seated R shoulder adduction PT manually resisted 10x3 ? ? ? ?Improved exercise technique, movement at target joints, use of target muscles after mod verbal, visual, tactile cues.  ?             ?  ?Manual therapy ?STM to B cervical paraspinals, R pec major at proximal portion near GHJ, and B upper traps to reduce muscle tension and R shoulder pain.  ?  ? ? ?  ?Response to treatment ?Pt tolerated session well without aggravation of symptoms. Good R shoulder flexion AROM observed.  ?  ? ? ? ? ?PATIENT EDUCATION: ?Education details: ther-ex ?Person educated: Patient ?Education method: Explanation, Demonstration, Tactile cues, and Verbal cues ?Education comprehension: verbalized understanding and returned demonstration ? ? ?HOME EXERCISE PROGRAM: ?Access Code: [email or web address] ?URL: [email or web address] ?Date: 10/16/2021 ?Prepared by: Joneen Boers ? ?Exercises ?Seated Scapular Retraction - 1 x daily - 7 x weekly - 3 sets - 10 reps - 5-15 seconds hold ?Sternocleidomastoid Stretch - 2 x daily - 7 x weekly - 1 sets - 5 reps - 10 seconds hold ?Isometric Shoulder Adduction - 1 x daily - 7 x weekly - 2-3 sets - 10 reps - 5 seconds hold ?Seated Shoulder Flexion AAROM with Pulley Behind - 1 x daily - 7 x weekly - 3 sets - 10 reps ?Supine Chin Tuck - 1 x daily - 7 x weekly - 3 sets - 10 reps ?Supine Shoulder Horizontal Abduction and Adduction - 1 x daily - 7 x weekly - 3 sets - 10 reps ?Single Arm Shoulder Extension with Anchored Resistance - 1 x daily - 7 x weekly - 3 sets - 10 reps ?Shoulder Adduction with Anchored Resistance - 1 x daily - 7 x weekly - 3 sets - 10 reps ? ? PT Short Term Goals  ? ?  ? PT SHORT TERM GOAL #1  ? Title Pt will be independent with her initial HEP to decrease pain, improve strength, function, and ability to reach, push, and pull more comfortably.   ? Baseline Pt has  started her HEP (05/22/2021); Pt performing her HEP. Has some questions but forgot what they are (06/25/2021), No questions with HEP, doing them (07/18/2021)   ? Time 3   ? Period Weeks   ? Status Achieved   ? Target Date 06/14/21   ? ?  ?  ? ?  ? ? ? PT Long Term Goals   ? ?  ? PT LONG TERM GOAL #1  ?  Title Pt will have a decrease in R shoulder pain to 3/10 or less at worst to promote ability to reach, open and close the door, lift items more comfortably.   ? Baseline 10/10 R shoulder pain at worst for the past 2 months (05/22/2021); 6/10 at worst for the past 7 days (06/25/2021); 9-10/10 at worst for the past 7 days (07/18/2021); (07/24/2021); 08/20/21: Worst pain reported at 8/10 NPS.  8/10 at worst for the past 7 days (sudden and sharp ) 09/10/2021; 6-7/10 at most for the past 7 days. Did better when she took the muscle relaxers. Took muscle relaxers last night. Knocks her out.  (10/04/2021)   ? Time 4   ? Period Weeks   ? Status On-going   ? Target Date 11/01/21   ?  ? PT LONG TERM GOAL #2  ? Title Pt will improve R shoulder flexion AROM to 150 degrees or more without pain to promote ability to reach.   ? Baseline 90 degrees R shoulder flexion AROM with pain (05/22/2021); 123 degrees flexion AROM, 147 degrees after resisted extension exercise (06/25/2021); 128 degrees flexion,, 97 degrees abduction  (07/18/2021); 36 degrees flexion (07/24/2021); 08/20/21: 135 degrees; 127 degrees flexion AROM (09/10/2021); 148 degrees flexion with just a touch of pain at the top (3/92023)   ? Time 4   ? Period Weeks   ? Status Partially Met   ? Target Date 11/01/21   ?  ? PT LONG TERM GOAL #3  ? Title Pt will improve her R shoulder strength all planes by at least 1/2 MMT grade to promote ability to reach as well as lift items.   ? Baseline R shoulder flexion 4/5, abduction 4-/5, ER 4+/5, IR 4/5 (05/22/2021); R shoulder flexion 4+/5, abduction 4/5, ER 4+/5, IR 4+/5 (06/25/2021); R shoulder flexion 4/5, abduction 4/5, ER 4/5, IR 5/5;  flexion 4+/5, abduction 4+/5, ER 4+/5, IR 5/5 (10/04/2021)   ? Time 4   ? Period Weeks   ? Status Partially Met   ? Target Date 11/01/21   ?  ? PT LONG TERM GOAL #4  ? Title Pt will improve R shoulder FOTO score

## 2021-10-17 NOTE — Therapy (Signed)
? ?OUTPATIENT PHYSICAL THERAPY TREATMENT NOTE ? ? ?Patient Name: Lynn Mann ?MRN: [id number] ?DOB:1950-09-18, 71 y.o., female ?Today's Date: 10/18/2021 ? ?PCP: Einar Pheasant, MD ?REFERRING PROVIDER: Diamond Nickel, DO ? ? PT End of Session - 10/18/21 1556   ? ? Visit Number [id number]   ? Number of Visits 45   ? Date for PT Re-Evaluation 11/01/21   ? Authorization Type 4   ? Authorization Time Period 10   ? PT Start Time 1550   ? PT Stop Time 4081   ? PT Time Calculation (min) 45 min   ? Activity Tolerance Patient tolerated treatment well   ? Behavior During Therapy [facility] for tasks assessed/performed   ? ?  ?  ? ?  ? ? ? ?Past Medical History:  ?Diagnosis Date  ? Arthritis   ? C. difficile diarrhea   ? age 57s-40s   ? Chicken pox   ? Cholecystitis 11/2011  ? Did not require sgy - Dr. Staci Acosta - [facility]  (cholelithiasis)  ? H/O Clostridium difficile infection   ? IBS (irritable bowel syndrome)   ? MRSA exposure 2005  ? Spider bite  ? MVP (mitral valve prolapse)   ? Stable - Dr. Ubaldo Glassing  ? Rheumatic fever   ? ?Past Surgical History:  ?Procedure Laterality Date  ? CATARACT EXTRACTION  [id number]  ? MUSCLE BIOPSY    ? ?Patient Active Problem List  ? Diagnosis Date Noted  ? Cough 09/07/2021  ? Right shoulder pain 07/15/2021  ? Hematoma 02/11/2021  ? Ankle swelling 11/26/2020  ? Low back pain 11/22/2020  ? Hip pain, right 11/22/2020  ? Light headedness 06/24/2020  ? Hearing loss 06/24/2020  ? Change in vision 06/24/2020  ? Stress 05/17/2020  ? Chest pain 05/16/2020  ? Welcome to Medicare preventive visit 06/20/2019  ? Viral syndrome 01/30/2019  ? Itching 07/21/2017  ? Asthma 07/16/2016  ? Urinary incontinence 07/16/2016  ? SOB (shortness of breath) 04/01/2016  ? Bilateral shoulder pain 02/24/2016  ? Fatigue 01/28/2016  ? Neck fullness 01/28/2016  ? Routine general medical examination at a health care facility 07/25/2015  ? Health care maintenance 10/09/2014  ? Neck pain 11/26/2013  ? Hypercholesterolemia 11/26/2013  ? History of  colonic polyps 05/11/2013  ? Hyperbilirubinemia 01/18/2013  ? GERD (gastroesophageal reflux disease) 12/03/2012  ? Cholelithiasis 11/07/2012  ? IBS (irritable bowel syndrome) 11/07/2012  ? History of rheumatic fever 08/28/2012  ? MVP (mitral valve prolapse) 08/28/2012  ? ? ?REFERRING DIAG: Acute pain of R shoulder, Fall, anterior subluxation of sternoclavicular joint, arthritis of R sternoclavicular joint ? ?THERAPY DIAG:  ?Chronic right shoulder pain ? ?Muscle weakness (generalized) ? ?PERTINENT HISTORY: R shoulder did not start really hurting until about a month after her fall. Just started with a little pain at the [facility] joint and clavicle. Sometimes she also feels pain along her R pectoralis muscle in the shape of a triangle. Pt fell June 2022. Pt got up to go to the bathroom and tripped over a suitcase. Pt fell against the garment rack on her R side which kind of shielded her R side. Pt also got a hematoma the size of a baked potato at her L leg afterwards. Pt hurt R toes during the fall which got better after 3 days. Went to urgent care for her leg. Hematoma has improved but still has a tiny knot in her L leg. Pt is R hand dominant. Pt was in PT around May to July 2022 for  for her R hip which is better. ? ?PRECAUTIONS: possible fall risk ? ?SUBJECTIVE: Pt reports dizziness is milder. Shoulder "feels ok" currently. ? ?PAIN:  ?Are you having pain? No pain, has discomfort. ? ? ? ? ?Objectives ? ?10/18/2021 ? ?Therapeutic exercise: ? ?Seated chest fly (adduction): no resistance, elbows flexed (short lever arm) 2x10 from 45 deg abduction to hands touching together in front of body ?  ?Seated Cervical retractions: 3x10, 5 sec holds  ?  ?Seated scap retractions with GTB: 3x10 ? ?Seated R shoulder assisted flexion to end range with PT manual resistance to extension 2x12 in scapular plane ? ? ?Manual Therapy: 15 min with pt in supine ?STM to B cervical paraspinals, R pec major at proximal portion near GHJ, and R upper  traps to reduce muscle tension and R shoulder pain.  ?   ?R/L upper trap stretch + contralateral cervical rotation: 3x30 sec/side ? ? ?  ?Response to treatment ?Pt tolerated session well without aggravation of symptoms. Good R shoulder flexion AROM observed.  ?  ?PATIENT EDUCATION: ?Education details: ther-ex ?Person educated: Patient ?Education method: Explanation, Demonstration, Tactile cues, and Verbal cues ?Education comprehension: verbalized understanding and returned demonstration ? ? ?HOME EXERCISE PROGRAM: ?Access Code: [email or web address] ?URL: [email or web address] ?Date: 10/16/2021 ?Prepared by: Joneen Boers ? ?Exercises ?Seated Scapular Retraction - 1 x daily - 7 x weekly - 3 sets - 10 reps - 5-15 seconds hold ?Sternocleidomastoid Stretch - 2 x daily - 7 x weekly - 1 sets - 5 reps - 10 seconds hold ?Isometric Shoulder Adduction - 1 x daily - 7 x weekly - 2-3 sets - 10 reps - 5 seconds hold ?Seated Shoulder Flexion AAROM with Pulley Behind - 1 x daily - 7 x weekly - 3 sets - 10 reps ?Supine Chin Tuck - 1 x daily - 7 x weekly - 3 sets - 10 reps ?Supine Shoulder Horizontal Abduction and Adduction - 1 x daily - 7 x weekly - 3 sets - 10 reps ?Single Arm Shoulder Extension with Anchored Resistance - 1 x daily - 7 x weekly - 3 sets - 10 reps ?Shoulder Adduction with Anchored Resistance - 1 x daily - 7 x weekly - 3 sets - 10 reps ? ? PT Short Term Goals  ? ?  ? PT SHORT TERM GOAL #1  ? Title Pt will be independent with her initial HEP to decrease pain, improve strength, function, and ability to reach, push, and pull more comfortably.   ? Baseline Pt has started her HEP (05/22/2021); Pt performing her HEP. Has some questions but forgot what they are (06/25/2021), No questions with HEP, doing them (07/18/2021)   ? Time 3   ? Period Weeks   ? Status Achieved   ? Target Date 06/14/21   ? ?  ?  ? ?  ? ? ? PT Long Term Goals   ? ?  ? PT LONG TERM GOAL #1  ? Title Pt will have a decrease in R shoulder pain to 3/10  or less at worst to promote ability to reach, open and close the door, lift items more comfortably.   ? Baseline 10/10 R shoulder pain at worst for the past 2 months (05/22/2021); 6/10 at worst for the past 7 days (06/25/2021); 9-10/10 at worst for the past 7 days (07/18/2021); (07/24/2021); 08/20/21: Worst pain reported at 8/10 NPS.  8/10 at worst for the past 7 days (sudden and sharp ) 09/10/2021; 6-7/10 at most for the past 7 days.  Did better when she took the muscle relaxers. Took muscle relaxers last night. Knocks her out.  (10/04/2021)   ? Time 4   ? Period Weeks   ? Status On-going   ? Target Date 11/01/21   ?  ? PT LONG TERM GOAL #2  ? Title Pt will improve R shoulder flexion AROM to 150 degrees or more without pain to promote ability to reach.   ? Baseline 90 degrees R shoulder flexion AROM with pain (05/22/2021); 123 degrees flexion AROM, 147 degrees after resisted extension exercise (06/25/2021); 128 degrees flexion,, 97 degrees abduction  (07/18/2021); 36 degrees flexion (07/24/2021); 08/20/21: 135 degrees; 127 degrees flexion AROM (09/10/2021); 148 degrees flexion with just a touch of pain at the top (3/92023)   ? Time 4   ? Period Weeks   ? Status Partially Met   ? Target Date 11/01/21   ?  ? PT LONG TERM GOAL #3  ? Title Pt will improve her R shoulder strength all planes by at least 1/2 MMT grade to promote ability to reach as well as lift items.   ? Baseline R shoulder flexion 4/5, abduction 4-/5, ER 4+/5, IR 4/5 (05/22/2021); R shoulder flexion 4+/5, abduction 4/5, ER 4+/5, IR 4+/5 (06/25/2021); R shoulder flexion 4/5, abduction 4/5, ER 4/5, IR 5/5; flexion 4+/5, abduction 4+/5, ER 4+/5, IR 5/5 (10/04/2021)   ? Time 4   ? Period Weeks   ? Status Partially Met   ? Target Date 11/01/21   ?  ? PT LONG TERM GOAL #4  ? Title Pt will improve R shoulder FOTO score by at least 10 points as a demonstration of improved function.   ? Baseline R shoulder FOTO 49 (05/22/2021); 59 (06/25/2021); (10/04/2021)   ? Time 8   ?  Period Weeks   ? Status Achieved   ? Target Date 07/19/21   ? ?  ?  ? ?  ? ? ? Plan   ? ? Clinical Impression Statement Continuing PT POC with focus on pain free mobility, periscapular strengthening, and AR

## 2021-10-18 ENCOUNTER — Ambulatory Visit: Payer: PPO

## 2021-10-18 ENCOUNTER — Other Ambulatory Visit: Payer: Self-pay

## 2021-10-18 DIAGNOSIS — M25511 Pain in right shoulder: Secondary | ICD-10-CM | POA: Diagnosis not present

## 2021-10-18 DIAGNOSIS — G8929 Other chronic pain: Secondary | ICD-10-CM

## 2021-10-18 DIAGNOSIS — M6281 Muscle weakness (generalized): Secondary | ICD-10-CM

## 2021-10-22 ENCOUNTER — Other Ambulatory Visit: Payer: Self-pay

## 2021-10-22 ENCOUNTER — Ambulatory Visit: Payer: PPO

## 2021-10-22 DIAGNOSIS — G8929 Other chronic pain: Secondary | ICD-10-CM

## 2021-10-22 DIAGNOSIS — M6281 Muscle weakness (generalized): Secondary | ICD-10-CM

## 2021-10-22 DIAGNOSIS — M25511 Pain in right shoulder: Secondary | ICD-10-CM | POA: Diagnosis not present

## 2021-10-22 NOTE — Therapy (Signed)
? ?OUTPATIENT PHYSICAL THERAPY TREATMENT NOTE ? ? ?Patient Name: Lynn Mann ?MRN: [id number] ?DOB:1950/09/21, 71 y.o., female ?Today's Date: 10/22/2021 ? ?PCP: Einar Pheasant, MD ?REFERRING PROVIDER: Diamond Nickel, DO ? ? PT End of Session - 10/22/21 1720   ? ? Visit Number [id number]   ? Number of Visits 45   ? Date for PT Re-Evaluation 11/01/21   ? Authorization Type 5   ? Authorization Time Period 10   ? PT Start Time 1721   ? PT Stop Time 7829   ? PT Time Calculation (min) 41 min   ? Activity Tolerance Patient tolerated treatment well   ? Behavior During Therapy [facility] for tasks assessed/performed   ? ?  ?  ? ?  ? ? ? ?Past Medical History:  ?Diagnosis Date  ? Arthritis   ? C. difficile diarrhea   ? age 52s-40s   ? Chicken pox   ? Cholecystitis 11/2011  ? Did not require sgy - Dr. Staci Acosta - [facility]  (cholelithiasis)  ? H/O Clostridium difficile infection   ? IBS (irritable bowel syndrome)   ? MRSA exposure 2005  ? Spider bite  ? MVP (mitral valve prolapse)   ? Stable - Dr. Ubaldo Glassing  ? Rheumatic fever   ? ?Past Surgical History:  ?Procedure Laterality Date  ? CATARACT EXTRACTION  [id number]  ? MUSCLE BIOPSY    ? ?Patient Active Problem List  ? Diagnosis Date Noted  ? Cough 09/07/2021  ? Right shoulder pain 07/15/2021  ? Hematoma 02/11/2021  ? Ankle swelling 11/26/2020  ? Low back pain 11/22/2020  ? Hip pain, right 11/22/2020  ? Light headedness 06/24/2020  ? Hearing loss 06/24/2020  ? Change in vision 06/24/2020  ? Stress 05/17/2020  ? Chest pain 05/16/2020  ? Welcome to Medicare preventive visit 06/20/2019  ? Viral syndrome 01/30/2019  ? Itching 07/21/2017  ? Asthma 07/16/2016  ? Urinary incontinence 07/16/2016  ? SOB (shortness of breath) 04/01/2016  ? Bilateral shoulder pain 02/24/2016  ? Fatigue 01/28/2016  ? Neck fullness 01/28/2016  ? Routine general medical examination at a health care facility 07/25/2015  ? Health care maintenance 10/09/2014  ? Neck pain 11/26/2013  ? Hypercholesterolemia 11/26/2013  ? History of  colonic polyps 05/11/2013  ? Hyperbilirubinemia 01/18/2013  ? GERD (gastroesophageal reflux disease) 12/03/2012  ? Cholelithiasis 11/07/2012  ? IBS (irritable bowel syndrome) 11/07/2012  ? History of rheumatic fever 08/28/2012  ? MVP (mitral valve prolapse) 08/28/2012  ? ? ?REFERRING DIAG: Acute pain of R shoulder, Fall, anterior subluxation of sternoclavicular joint, arthritis of R sternoclavicular joint ? ?THERAPY DIAG:  ?Chronic right shoulder pain ? ?Muscle weakness (generalized) ? ?PERTINENT HISTORY: R shoulder did not start really hurting until about a month after her fall. Just started with a little pain at the [facility] joint and clavicle. Sometimes she also feels pain along her R pectoralis muscle in the shape of a triangle. Pt fell June 2022. Pt got up to go to the bathroom and tripped over a suitcase. Pt fell against the garment rack on her R side which kind of shielded her R side. Pt also got a hematoma the size of a baked potato at her L leg afterwards. Pt hurt R toes during the fall which got better after 3 days. Went to urgent care for her leg. Hematoma has improved but still has a tiny knot in her L leg. Pt is R hand dominant. Pt was in PT around May to July 2022 for  for her R hip which is better. ? ?PRECAUTIONS: possible fall risk ? ?SUBJECTIVE: R shoulder is doing ok. Has some pain occasionally but its doing better I think. Dizziness if off and on. Has an appointment on Wednesday to see the balance people.    ? ? ?PAIN:  ?Are you having pain? No ? ? ? ? ?Objectives ? ? ?10/22/2021 ?Therapeutic exercise:  ?    ?Seated chest fly (adduction): no resistance, elbows flexed (short lever arm) 3x10 from 45 deg abduction to hands touching together in front of body ?  ?Seated Cervical retractions: 3x10, 5 sec holds  ?  ?Seated scap retractions with GTB: 5x5 seconds, then 10x2 ?  ?Standing R shoulder adduction red band 5x, then 10x ? ?Blood pressure L arm sitting, mechanically taken, normal cuff 142/69, HR 103, SpO2  95% room air ? ? ?Seated R shoulder assisted flexion to end range with PT manual resistance to extension 10x3 ? Feels good in flexion reported ?  ?Seated R shoulder adduction PT manually resisted 10x3 ?  ?  ?  ?Improved exercise technique, movement at target joints, use of target muscles after mod verbal, visual, tactile cues.  ?             ?  ?Manual therapy ?STM to B cervical paraspinals, B upper traps to reduce muscle tension and R shoulder pain.  ?  ? ? ?  ?Response to treatment ?Fair tolerance to today's session ? ? ? ? ? ? ?PATIENT EDUCATION: ?Education details: ther-ex ?Person educated: Patient ?Education method: Explanation, Demonstration, Tactile cues, and Verbal cues ?Education comprehension: verbalized understanding and returned demonstration ? ? ?HOME EXERCISE PROGRAM: ?Access Code: [email or web address] ?URL: [email or web address] ?Date: 10/16/2021 ?Prepared by: Joneen Boers ? ?Exercises ?Seated Scapular Retraction - 1 x daily - 7 x weekly - 3 sets - 10 reps - 5-15 seconds hold ?Sternocleidomastoid Stretch - 2 x daily - 7 x weekly - 1 sets - 5 reps - 10 seconds hold ?Isometric Shoulder Adduction - 1 x daily - 7 x weekly - 2-3 sets - 10 reps - 5 seconds hold ?Seated Shoulder Flexion AAROM with Pulley Behind - 1 x daily - 7 x weekly - 3 sets - 10 reps ?Supine Chin Tuck - 1 x daily - 7 x weekly - 3 sets - 10 reps ?Supine Shoulder Horizontal Abduction and Adduction - 1 x daily - 7 x weekly - 3 sets - 10 reps ?Single Arm Shoulder Extension with Anchored Resistance - 1 x daily - 7 x weekly - 3 sets - 10 reps ?Shoulder Adduction with Anchored Resistance - 1 x daily - 7 x weekly - 3 sets - 10 reps ? ? PT Short Term Goals  ? ?  ? PT SHORT TERM GOAL #1  ? Title Pt will be independent with her initial HEP to decrease pain, improve strength, function, and ability to reach, push, and pull more comfortably.   ? Baseline Pt has started her HEP (05/22/2021); Pt performing her HEP. Has some questions but forgot what  they are (06/25/2021), No questions with HEP, doing them (07/18/2021)   ? Time 3   ? Period Weeks   ? Status Achieved   ? Target Date 06/14/21   ? ?  ?  ? ?  ? ? ? PT Long Term Goals   ? ?  ? PT LONG TERM GOAL #1  ? Title Pt will have a decrease in R shoulder pain to 3/10 or less at worst to promote  ability to reach, open and close the door, lift items more comfortably.   ? Baseline 10/10 R shoulder pain at worst for the past 2 months (05/22/2021); 6/10 at worst for the past 7 days (06/25/2021); 9-10/10 at worst for the past 7 days (07/18/2021); (07/24/2021); 08/20/21: Worst pain reported at 8/10 NPS.  8/10 at worst for the past 7 days (sudden and sharp ) 09/10/2021; 6-7/10 at most for the past 7 days. Did better when she took the muscle relaxers. Took muscle relaxers last night. Knocks her out.  (10/04/2021)   ? Time 4   ? Period Weeks   ? Status On-going   ? Target Date 11/01/21   ?  ? PT LONG TERM GOAL #2  ? Title Pt will improve R shoulder flexion AROM to 150 degrees or more without pain to promote ability to reach.   ? Baseline 90 degrees R shoulder flexion AROM with pain (05/22/2021); 123 degrees flexion AROM, 147 degrees after resisted extension exercise (06/25/2021); 128 degrees flexion,, 97 degrees abduction  (07/18/2021); 36 degrees flexion (07/24/2021); 08/20/21: 135 degrees; 127 degrees flexion AROM (09/10/2021); 148 degrees flexion with just a touch of pain at the top (3/92023)   ? Time 4   ? Period Weeks   ? Status Partially Met   ? Target Date 11/01/21   ?  ? PT LONG TERM GOAL #3  ? Title Pt will improve her R shoulder strength all planes by at least 1/2 MMT grade to promote ability to reach as well as lift items.   ? Baseline R shoulder flexion 4/5, abduction 4-/5, ER 4+/5, IR 4/5 (05/22/2021); R shoulder flexion 4+/5, abduction 4/5, ER 4+/5, IR 4+/5 (06/25/2021); R shoulder flexion 4/5, abduction 4/5, ER 4/5, IR 5/5; flexion 4+/5, abduction 4+/5, ER 4+/5, IR 5/5 (10/04/2021)   ? Time 4   ? Period Weeks   ?  Status Partially Met   ? Target Date 11/01/21   ?  ? PT LONG TERM GOAL #4  ? Title Pt will improve R shoulder FOTO score by at least 10 points as a demonstration of improved function.   ? Baseline R sho

## 2021-10-23 ENCOUNTER — Telehealth: Payer: Self-pay

## 2021-10-23 NOTE — Telephone Encounter (Signed)
10/23/2021 phone call: Lynn Mann is having vertigo and has an appt. at 8:30 in the morning for an Epley Procedure for it.   She is concerned if it is ok for her to do this b/c of her neck issues. Tel# [phone]  ? ?Called pt back. Unable to provide answer for her. Recommended to let the ENT know the history of her neck and the restrictions her doctor told her for it and see how the ENT would like to proceed. Pt verbalized understanding. ?

## 2021-10-24 ENCOUNTER — Other Ambulatory Visit: Payer: Self-pay

## 2021-10-24 ENCOUNTER — Ambulatory Visit: Payer: PPO

## 2021-10-24 DIAGNOSIS — G8929 Other chronic pain: Secondary | ICD-10-CM

## 2021-10-24 DIAGNOSIS — M25511 Pain in right shoulder: Secondary | ICD-10-CM | POA: Diagnosis not present

## 2021-10-24 DIAGNOSIS — M6281 Muscle weakness (generalized): Secondary | ICD-10-CM

## 2021-10-24 DIAGNOSIS — H8112 Benign paroxysmal vertigo, left ear: Secondary | ICD-10-CM | POA: Diagnosis not present

## 2021-10-24 NOTE — Therapy (Signed)
? ?OUTPATIENT PHYSICAL THERAPY TREATMENT NOTE ? ? ?Patient Name: Lynn Mann ?MRN: [id number] ?DOB:03-17-1951, 71 y.o., female ?Today's Date: 10/24/2021 ? ?PCP: Einar Pheasant, MD ?REFERRING PROVIDER: Diamond Nickel, DO ? ? PT End of Session - 10/24/21 1150   ? ? Visit Number 36   ? Number of Visits 45   ? Date for PT Re-Evaluation 11/01/21   ? Authorization Type 6   ? Authorization Time Period 10   ? PT Start Time 1150   ? PT Stop Time 1235   ? PT Time Calculation (min) 45 min   ? Activity Tolerance Patient tolerated treatment well   ? Behavior During Therapy [facility] for tasks assessed/performed   ? ?  ?  ? ?  ? ? ? ? ?Past Medical History:  ?Diagnosis Date  ? Arthritis   ? C. difficile diarrhea   ? age 17s-40s   ? Chicken pox   ? Cholecystitis 11/2011  ? Did not require sgy - Dr. Staci Acosta - [facility]  (cholelithiasis)  ? H/O Clostridium difficile infection   ? IBS (irritable bowel syndrome)   ? MRSA exposure 2005  ? Spider bite  ? MVP (mitral valve prolapse)   ? Stable - Dr. Ubaldo Glassing  ? Rheumatic fever   ? ?Past Surgical History:  ?Procedure Laterality Date  ? CATARACT EXTRACTION  [id number]  ? MUSCLE BIOPSY    ? ?Patient Active Problem List  ? Diagnosis Date Noted  ? Cough 09/07/2021  ? Right shoulder pain 07/15/2021  ? Hematoma 02/11/2021  ? Ankle swelling 11/26/2020  ? Low back pain 11/22/2020  ? Hip pain, right 11/22/2020  ? Light headedness 06/24/2020  ? Hearing loss 06/24/2020  ? Change in vision 06/24/2020  ? Stress 05/17/2020  ? Chest pain 05/16/2020  ? Welcome to Medicare preventive visit 06/20/2019  ? Viral syndrome 01/30/2019  ? Itching 07/21/2017  ? Asthma 07/16/2016  ? Urinary incontinence 07/16/2016  ? SOB (shortness of breath) 04/01/2016  ? Bilateral shoulder pain 02/24/2016  ? Fatigue 01/28/2016  ? Neck fullness 01/28/2016  ? Routine general medical examination at a health care facility 07/25/2015  ? Health care maintenance 10/09/2014  ? Neck pain 11/26/2013  ? Hypercholesterolemia 11/26/2013  ? History of  colonic polyps 05/11/2013  ? Hyperbilirubinemia 01/18/2013  ? GERD (gastroesophageal reflux disease) 12/03/2012  ? Cholelithiasis 11/07/2012  ? IBS (irritable bowel syndrome) 11/07/2012  ? History of rheumatic fever 08/28/2012  ? MVP (mitral valve prolapse) 08/28/2012  ? ? ?REFERRING DIAG: Acute pain of R shoulder, Fall, anterior subluxation of sternoclavicular joint, arthritis of R sternoclavicular joint ? ?THERAPY DIAG:  ?Chronic right shoulder pain ? ?Muscle weakness (generalized) ? ?PERTINENT HISTORY: R shoulder did not start really hurting until about a month after her fall. Just started with a little pain at the [facility] joint and clavicle. Sometimes she also feels pain along her R pectoralis muscle in the shape of a triangle. Pt fell June 2022. Pt got up to go to the bathroom and tripped over a suitcase. Pt fell against the garment rack on her R side which kind of shielded her R side. Pt also got a hematoma the size of a baked potato at her L leg afterwards. Pt hurt R toes during the fall which got better after 3 days. Went to urgent care for her leg. Hematoma has improved but still has a tiny knot in her L leg. Pt is R hand dominant. Pt was in PT around May to July 2022  for for her R hip which is better. ? ?PRECAUTIONS: possible fall risk ? ?SUBJECTIVE: its been a rough morning. Did the Epley. The L ear was really bad. The R ear was a little iffy. Focused on the L ear today. Dizziness is a little better now. This morning was terrible. R shoulder is ok. Had a terrible episode Monday night when she lay flat. Wands to continue PT when plan of care ends.  ? ? ? ?PAIN:  ?Are you having pain? 1/10 R proximal clavicle. ? ? ? ? ?Objectives ? ? ?10/24/2021 ? ?  ?Manual therapy  ?STM to B cervical paraspinals, B upper traps to reduce muscle tension and R shoulder pain.  ? ?Seated STM R pectoralis muscle to decrease tension.  ? ?Therapeutic exercise:  ?    ?Seated chest fly (adduction): no resistance, elbows flexed (short  lever arm) 3x10 from 45 deg abduction to hands touching together in front of body ?  ?Seated Cervical retractions: 3x10, 5 sec holds  ?  ? ?Standing R shoulder adduction red band 10x5 seconds for 2 sets  ?  ?Dizziness reported when turning to sit ? ?Blood pressure L arm sitting, mechanically taken, normal cuff 121/49, HR 74,  ? ? ?Seated R shoulder assisted flexion to end range with PT manual resistance to extension 10x3 ?  ?  ?Improved exercise technique, movement at target joints, use of target muscles after mod verbal, visual, tactile cues.  ?             ? ? ?  ? ? ?  ?Response to treatment ?Fair tolerance to today's session ? ? ? ? ? ? ?PATIENT EDUCATION: ?Education details: ther-ex ?Person educated: Patient ?Education method: Explanation, Demonstration, Tactile cues, and Verbal cues ?Education comprehension: verbalized understanding and returned demonstration ? ? ?HOME EXERCISE PROGRAM: ?Access Code: [email or web address] ?URL: [email or web address] ?Date: 10/16/2021 ?Prepared by: Miguel Laygo ? ?Exercises ?Seated Scapular Retraction - 1 x daily - 7 x weekly - 3 sets - 10 reps - 5-15 seconds hold ?Sternocleidomastoid Stretch - 2 x daily - 7 x weekly - 1 sets - 5 reps - 10 seconds hold ?Isometric Shoulder Adduction - 1 x daily - 7 x weekly - 2-3 sets - 10 reps - 5 seconds hold ?Seated Shoulder Flexion AAROM with Pulley Behind - 1 x daily - 7 x weekly - 3 sets - 10 reps ?Supine Chin Tuck - 1 x daily - 7 x weekly - 3 sets - 10 reps ?Supine Shoulder Horizontal Abduction and Adduction - 1 x daily - 7 x weekly - 3 sets - 10 reps ?Single Arm Shoulder Extension with Anchored Resistance - 1 x daily - 7 x weekly - 3 sets - 10 reps ?Shoulder Adduction with Anchored Resistance - 1 x daily - 7 x weekly - 3 sets - 10 reps ? ? PT Short Term Goals  ? ?  ? PT SHORT TERM GOAL #1  ? Title Pt will be independent with her initial HEP to decrease pain, improve strength, function, and ability to reach, push, and pull more  comfortably.   ? Baseline Pt has started her HEP (05/22/2021); Pt performing her HEP. Has some questions but forgot what they are (06/25/2021), No questions with HEP, doing them (07/18/2021)   ? Time 3   ? Period Weeks   ? Status Achieved   ? Target Date 06/14/21   ? ?  ?  ? ?  ? ? ? PT Long Term Goals   ? ?  ?   PT LONG TERM GOAL #1  ? Title Pt will have a decrease in R shoulder pain to 3/10 or less at worst to promote ability to reach, open and close the door, lift items more comfortably.   ? Baseline 10/10 R shoulder pain at worst for the past 2 months (05/22/2021); 6/10 at worst for the past 7 days (06/25/2021); 9-10/10 at worst for the past 7 days (07/18/2021); (07/24/2021); 08/20/21: Worst pain reported at 8/10 NPS.  8/10 at worst for the past 7 days (sudden and sharp ) 09/10/2021; 6-7/10 at most for the past 7 days. Did better when she took the muscle relaxers. Took muscle relaxers last night. Knocks her out.  (10/04/2021)   ? Time 4   ? Period Weeks   ? Status On-going   ? Target Date 11/01/21   ?  ? PT LONG TERM GOAL #2  ? Title Pt will improve R shoulder flexion AROM to 150 degrees or more without pain to promote ability to reach.   ? Baseline 90 degrees R shoulder flexion AROM with pain (05/22/2021); 123 degrees flexion AROM, 147 degrees after resisted extension exercise (06/25/2021); 128 degrees flexion,, 97 degrees abduction  (07/18/2021); 36 degrees flexion (07/24/2021); 08/20/21: 135 degrees; 127 degrees flexion AROM (09/10/2021); 148 degrees flexion with just a touch of pain at the top (3/92023)   ? Time 4   ? Period Weeks   ? Status Partially Met   ? Target Date 11/01/21   ?  ? PT LONG TERM GOAL #3  ? Title Pt will improve her R shoulder strength all planes by at least 1/2 MMT grade to promote ability to reach as well as lift items.   ? Baseline R shoulder flexion 4/5, abduction 4-/5, ER 4+/5, IR 4/5 (05/22/2021); R shoulder flexion 4+/5, abduction 4/5, ER 4+/5, IR 4+/5 (06/25/2021); R shoulder flexion 4/5,  abduction 4/5, ER 4/5, IR 5/5; flexion 4+/5, abduction 4+/5, ER 4+/5, IR 5/5 (10/04/2021)   ? Time 4   ? Period Weeks   ? Status Partially Met   ? Target Date 11/01/21   ?  ? PT LONG TERM GOAL #4  ? Title Pt will

## 2021-10-25 ENCOUNTER — Ambulatory Visit (INDEPENDENT_AMBULATORY_CARE_PROVIDER_SITE_OTHER): Payer: PPO | Admitting: Internal Medicine

## 2021-10-25 ENCOUNTER — Encounter: Payer: Self-pay | Admitting: Internal Medicine

## 2021-10-25 VITALS — Temp 98.8°F | Ht 67.0 in | Wt 184.8 lb

## 2021-10-25 DIAGNOSIS — H6983 Other specified disorders of Eustachian tube, bilateral: Secondary | ICD-10-CM | POA: Diagnosis not present

## 2021-10-25 DIAGNOSIS — J309 Allergic rhinitis, unspecified: Secondary | ICD-10-CM | POA: Diagnosis not present

## 2021-10-25 DIAGNOSIS — H8113 Benign paroxysmal vertigo, bilateral: Secondary | ICD-10-CM

## 2021-10-25 DIAGNOSIS — H532 Diplopia: Secondary | ICD-10-CM | POA: Diagnosis not present

## 2021-10-25 MED ORDER — FLUTICASONE PROPIONATE 50 MCG/ACT NA SUSP: 2.0000 | Freq: Every day | NASAL | 11 refills | Status: AC

## 2021-10-25 MED ORDER — MECLIZINE HCL 25 MG PO TABS: 12.5000 mg | ORAL_TABLET | Freq: Two times a day (BID) | ORAL | 2 refills | Status: DC | PRN

## 2021-10-25 NOTE — Patient Instructions (Addendum)
Call Dr. Ellin Mayhew and get an eye exam double vision  ?Call and schedule Ear, nose and throat appt as well  ? ?Consider brain imaging again if not better  ? ?If heart continues to race call [facility] cardiology for another heart monitor  ?Sydnee Levans, MD   ?[address]   ?[place], [place] [address]   ?Phone: [phone]   ?Fax: [phone]   ? ?Dizziness ?Dizziness is a common problem. It is a feeling of unsteadiness or light-headedness. You may feel like you are about to faint. Dizziness can lead to injury if you stumble or fall. Anyone can become dizzy, but dizziness is more common in older adults. This condition can be caused by a number of things, including medicines, dehydration, or illness. ?Follow these instructions at home: ?Eating and drinking ? ?Drink enough fluid to keep your urine pale yellow. This helps to keep you from becoming dehydrated. Try to drink more clear fluids, such as water. ?Do not drink alcohol. ?Limit your caffeine intake if told to do so by your health care provider. Check ingredients and nutrition facts to see if a food or beverage contains caffeine. ?Limit your salt (sodium) intake if told to do so by your health care provider. Check ingredients and nutrition facts to see if a food or beverage contains sodium. ?Activity ? ?Avoid making quick movements. ?Rise slowly from chairs and steady yourself until you feel okay. ?In the morning, first sit up on the side of the bed. When you feel okay, stand slowly while you hold onto something until you know that your balance is good. ?If you need to stand in one place for a long time, move your legs often. Tighten and relax the muscles in your legs while you are standing. ?Do not drive or use machinery if you feel dizzy. ?Avoid bending down if you feel dizzy. Place items in your home so that they are easy for you to reach without leaning over. ?Lifestyle ?Do not use any products that contain nicotine or tobacco. These products include  cigarettes, chewing tobacco, and vaping devices, such as e-cigarettes. If you need help quitting, ask your health care provider. ?Try to reduce your stress level by using methods such as yoga or meditation. Talk with your health care provider if you need help to manage your stress. ?General instructions ?Watch your dizziness for any changes. ?Take over-the-counter and prescription medicines only as told by your health care provider. Talk with your health care provider if you think that your dizziness is caused by a medicine that you are taking. ?Tell a friend or a family member that you are feeling dizzy. If he or she notices any changes in your behavior, have this person call your health care provider. ?Keep all follow-up visits. This is important. ?Contact a health care provider if: ?Your dizziness does not go away or you have new symptoms. ?Your dizziness or light-headedness gets worse. ?You feel nauseous. ?You have reduced hearing. ?You have a fever. ?You have neck pain or a stiff neck. ?Your dizziness leads to an injury or a fall. ?Get help right away if: ?You vomit or have diarrhea and are unable to eat or drink anything. ?You have problems talking, walking, swallowing, or using your arms, hands, or legs. ?You feel generally weak. ?You have any bleeding. ?You are not thinking clearly or you have trouble forming sentences. It may take a friend or family member to notice this. ?You have chest pain, abdominal pain, shortness of breath, or  sweating. ?Your vision changes or you develop a severe headache. ?These symptoms may represent a serious problem that is an emergency. Do not wait to see if the symptoms will go away. Get medical help right away. Call your local emergency services (911 in the [place] Do not drive yourself to the hospital. ?Summary ?Dizziness is a feeling of unsteadiness or light-headedness. This condition can be caused by a number of things, including medicines, dehydration, or illness. ?Anyone  can become dizzy, but dizziness is more common in older adults. ?Drink enough fluid to keep your urine pale yellow. Do not drink alcohol. ?Avoid making quick movements if you feel dizzy. Monitor your dizziness for any changes. ?This information is not intended to replace advice given to you by your health care provider. Make sure you discuss any questions you have with your health care provider. ?Document Revised: 06/19/2020 Document Reviewed: 06/19/2020 ?Elsevier Patient Education ? [place]. ? ?Meclizine tablets or capsules ?What is this medication? ?MECLIZINE (MEK li zeen) is an antihistamine. It is used to prevent nausea, vomiting, or dizziness caused by motion sickness. It is also used to prevent and treat vertigo (extreme dizziness or a feeling that you or your surroundings are tilting or spinning around). ?This medicine may be used for other purposes; ask your health care provider or pharmacist if you have questions. ?COMMON BRAND NAME(S): Antivert, Dramamine Less Drowsy, Dramamine-N, Medivert, Meni-D ?What should I tell my care team before I take this medication? ?They need to know if you have any of these conditions: ?glaucoma ?lung or breathing disease, like asthma ?problems urinating ?prostate disease ?stomach or intestine problems ?an unusual or allergic reaction to meclizine, other medicines, foods, dyes, or preservatives ?pregnant or trying to get pregnant ?breast-feeding ?How should I use this medication? ?Take this medicine by mouth with a glass of water. Follow the directions on the prescription label. If you are using this medicine to prevent motion sickness, take the dose at least 1 hour before travel. If it upsets your stomach, take it with food or milk. Take your doses at regular intervals. Do not take your medicine more often than directed. ?Talk to your pediatrician regarding the use of this medicine in children. Special care may be needed. ?Overdosage: If you think you have taken too  much of this medicine contact a poison control center or emergency room at once. ?NOTE: This medicine is only for you. Do not share this medicine with others. ?What if I miss a dose? ?If you miss a dose, take it as soon as you can. If it is almost time for your next dose, take only that dose. Do not take double or extra doses. ?What may interact with this medication? ?Do not take this medicine with any of the following medications: ?MAOIs like Carbex, Eldepryl, Marplan, Nardil, and Parnate ?This medicine may also interact with the following medications: ?alcohol ?antihistamines for allergy, cough and cold ?certain medicines for anxiety or sleep ?certain medicines for depression, like amitriptyline, fluoxetine, sertraline ?certain medicines for seizures like phenobarbital, primidone ?general anesthetics like halothane, isoflurane, methoxyflurane, propofol ?local anesthetics like lidocaine, pramoxine, tetracaine ?medicines that relax muscles for surgery ?narcotic medicines for pain ?phenothiazines like chlorpromazine, mesoridazine, prochlorperazine, thioridazine ?This list may not describe all possible interactions. Give your health care provider a list of all the medicines, herbs, non-prescription drugs, or dietary supplements you use. Also tell them if you smoke, drink alcohol, or use illegal drugs. Some items may interact with your medicine. ?What should I watch  for while using this medication? ?Tell your doctor or healthcare professional if your symptoms do not start to get better or if they get worse. ?You may get drowsy or dizzy. Do not drive, use machinery, or do anything that needs mental alertness until you know how this medicine affects you. Do not stand or sit up quickly, especially if you are an older patient. This reduces the risk of dizzy or fainting spells. Alcohol may interfere with the effect of this medicine. Avoid alcoholic drinks. ?Your mouth may get dry. Chewing sugarless gum or sucking hard  candy, and drinking plenty of water may help. Contact your doctor if the problem does not go away or is severe. ?This medicine may cause dry eyes and blurred vision. If you wear contact lenses you may feel some

## 2021-10-25 NOTE — Progress Notes (Signed)
Chief Complaint  ?Patient presents with  ? Dizziness  ? ?Acute visit dizziness and room spinning off balance x 2 weeks and 1 episode at rest HR 131 at rest but resolved reviewed holter Dr. Ubaldo Glassing 2019 h/o PACS/PVCS. When she felt like this she was using lidocaine patches/cream, flexeril. She feels like vision distorted/double vision, foggy vision, lines in vision.  ?Dizziness worse with walking and bending over ?She is established with [facility] no appts until 11/2021 she went recently and had epley manuver left side + and +/- positive on the right  ?She is drinking 16 ounces of bottled water daily at least or more ? ?Pt wants ears checked and she thinks she has "vertigo"  ?Review of prior imaging (see below) ?Orthostatics checked today: lying 122/84 HR 83 sitting 108/76 hr 84 and she was slightly dizzy, standing 112/84 HR 101 and she felt off balance  ? ?09/14/20 brain MRI  ?IMPRESSION: ?1. Negative internal auditory canal imaging. ?2. No acute intracranial abnormality. ?3. Mild chronic small vessel ischemic disease. ? ?CT neck 01/31/21  ?VASCULAR: Major cervical vessels are patent. ? ?Review of Systems  ?Constitutional:  Negative for weight loss.  ?HENT:  Negative for hearing loss.   ?Eyes:  Negative for blurred vision.  ?Respiratory:  Negative for shortness of breath.   ?Cardiovascular:  Negative for chest pain.  ?Gastrointestinal:  Negative for abdominal pain and blood in stool.  ?Genitourinary:  Negative for dysuria.  ?Musculoskeletal:  Negative for falls and joint pain.  ?Skin:  Negative for rash.  ?Neurological:  Negative for headaches.  ?Psychiatric/Behavioral:  Negative for depression.   ?Past Medical History:  ?Diagnosis Date  ? Arthritis   ? C. difficile diarrhea   ? age 65s-40s   ? Chicken pox   ? Cholecystitis 11/2011  ? Did not require sgy - Dr. Staci Acosta - [facility]  (cholelithiasis)  ? H/O Clostridium difficile infection   ? IBS (irritable bowel syndrome)   ? MRSA exposure 2005  ? Spider bite  ? MVP (mitral  valve prolapse)   ? Stable - Dr. Ubaldo Glassing  ? Rheumatic fever   ? ?Past Surgical History:  ?Procedure Laterality Date  ? CATARACT EXTRACTION  [id number]  ? MUSCLE BIOPSY    ? ?Family History  ?Problem Relation Age of Onset  ? Arthritis Mother   ? Stroke Mother   ? Diabetes Mother   ? Hypertension Mother   ? Arthritis Father   ? Stroke Father   ? Diabetes Paternal Grandmother   ? Cancer Paternal Uncle   ?     colon  ? Heart disease Other   ?     maternal and paternal side  ? Breast cancer Paternal Aunt   ? Breast cancer Cousin   ? Breast cancer Cousin   ?     female cousin  ? Breast cancer Cousin   ? ?Social History  ? ?Socioeconomic History  ? Marital status: Widowed  ?  Spouse name: Not on file  ? Number of children: Not on file  ? Years of education: 70  ? Highest education level: Not on file  ?Occupational History  ? Occupation: Retired  ?Tobacco Use  ? Smoking status: Never  ? Smokeless tobacco: Never  ?Vaping Use  ? Vaping Use: Never used  ?Substance and Sexual Activity  ? Alcohol use: Not Currently  ?  Comment: Rarely  ? Drug use: No  ? Sexual activity: Yes  ?  Partners: Male  ?  Birth control/protection:  Post-menopausal  ?Other Topics Concern  ? Not on file  ?Social History Narrative  ? Not on file  ? ?Social Determinants of Health  ? ?Financial Resource Strain: Low Risk   ? Difficulty of Paying Living Expenses: Not hard at all  ?Food Insecurity: No Food Insecurity  ? Worried About Charity fundraiser in the Last Year: Never true  ? Ran Out of Food in the Last Year: Never true  ?Transportation Needs: No Transportation Needs  ? Lack of Transportation (Medical): No  ? Lack of Transportation (Non-Medical): No  ?Physical Activity: Insufficiently Active  ? Days of Exercise per Week: 2 days  ? Minutes of Exercise per Session: 50 min  ?Stress: No Stress Concern Present  ? Feeling of Stress : Not at all  ?Social Connections: Unknown  ? Frequency of Communication with Friends and Family: More than three times a week  ?  Frequency of Social Gatherings with Friends and Family: More than three times a week  ? Attends Religious Services: Not on file  ? Active Member of Clubs or Organizations: Not on file  ? Attends Archivist Meetings: Not on file  ? Marital Status: Widowed  ?Intimate Partner Violence: Not At Risk  ? Fear of Current or Ex-Partner: No  ? Emotionally Abused: No  ? Physically Abused: No  ? Sexually Abused: No  ? ?Current Meds  ?Medication Sig  ? albuterol (VENTOLIN HFA) 108 (90 Base) MCG/ACT inhaler Inhale 2 puffs into the lungs every 6 (six) hours as needed for wheezing or shortness of breath.  ? b complex vitamins capsule Take 1 capsule by mouth daily.  ? CALCIUM PO Take by mouth.  ? Cholecalciferol (VITAMIN D3 PO) Take by mouth.  ? fluticasone (FLONASE) 50 MCG/ACT nasal spray Place 2 sprays into both nostrils daily. prn  ? meclizine (ANTIVERT) 25 MG tablet Take 0.5-1 tablets (12.5-25 mg total) by mouth 2 (two) times daily as needed for dizziness.  ? Multiple Vitamins-Minerals (PRESERVISION AREDS 2 PO) Take by mouth.  ? ?Allergies  ?Allergen Reactions  ? Other Anaphylaxis  ?  Artificial sweetener  ? Adhesive [Tape]   ? Tartrazine   ? Yellow Dyes (Non-Tartrazine) Other (See Comments)  ?  Arthritis pains - it is tartrazine ?Yellow #5  ? ?Recent Results (from the past 2160 hour(s))  ?Basic metabolic panel     Status: None  ? Collection Time: 09/07/21  2:44 PM  ?Result Value Ref Range  ? Glucose, Bld 84 65 - 99 mg/dL  ?  Comment: . ?           Fasting reference interval ?. ?  ? BUN 12 7 - 25 mg/dL  ? Creat 0.73 0.50 - 1.05 mg/dL  ? BUN/Creatinine Ratio NOT APPLICABLE 6 - 22 (calc)  ? Sodium 139 135 - 146 mmol/L  ? Potassium 4.2 3.5 - 5.3 mmol/L  ? Chloride 103 98 - 110 mmol/L  ? CO2 26 20 - 32 mmol/L  ? Calcium 9.5 8.6 - 10.4 mg/dL  ?Lipid panel     Status: Abnormal  ? Collection Time: 09/07/21  2:44 PM  ?Result Value Ref Range  ? Cholesterol 215 (H) <200 mg/dL  ? HDL 40 (L) > OR = 50 mg/dL  ? Triglycerides 147  <150 mg/dL  ? LDL Cholesterol (Calc) 148 (H) mg/dL (calc)  ?  Comment: Reference range: <100 ?Marland Kitchen ?Desirable range <100 mg/dL for primary prevention;   ?<70 mg/dL for patients with CHD or diabetic patients  ?  with > or = 2 CHD risk factors. ?. ?LDL-C is now calculated using the Martin-Hopkins  ?calculation, which is a validated novel method providing  ?better accuracy than the Friedewald equation in the  ?estimation of LDL-C.  ?Cresenciano Genre et al. Annamaria Helling. 1031;594(58): 2061-2068  ?(http://education.[facility].com/faq/FAQ164) ?  ? Total CHOL/HDL Ratio 5.4 (H) <5.0 (calc)  ? Non-HDL Cholesterol (Calc) 175 (H) <130 mg/dL (calc)  ?  Comment: For patients with diabetes plus 1 major ASCVD risk  ?factor, treating to a non-HDL-C goal of <100 mg/dL  ?(LDL-C of <70 mg/dL) is considered a therapeutic  ?option. ?  ?Hepatic function panel     Status: Abnormal  ? Collection Time: 09/07/21  2:44 PM  ?Result Value Ref Range  ? Total Protein 6.8 6.1 - 8.1 g/dL  ? Albumin 4.4 3.6 - 5.1 g/dL  ? Globulin 2.4 1.9 - 3.7 g/dL (calc)  ? AG Ratio 1.8 1.0 - 2.5 (calc)  ? Total Bilirubin 1.3 (H) 0.2 - 1.2 mg/dL  ? Bilirubin, Direct 0.2 0.0 - 0.2 mg/dL  ? Indirect Bilirubin 1.1 0.2 - 1.2 mg/dL (calc)  ? Alkaline phosphatase (APISO) 61 37 - 153 U/L  ? AST 18 10 - 35 U/L  ? ALT 12 6 - 29 U/L  ?CBC with Differential/Platelet     Status: None  ? Collection Time: 09/07/21  2:44 PM  ?Result Value Ref Range  ? WBC 5.2 3.8 - 10.8 Thousand/uL  ? RBC 4.77 3.80 - 5.10 Million/uL  ? Hemoglobin 13.8 11.7 - 15.5 g/dL  ? HCT 40.7 35.0 - 45.0 %  ? MCV 85.3 80.0 - 100.0 fL  ? MCH 28.9 27.0 - 33.0 pg  ? MCHC 33.9 32.0 - 36.0 g/dL  ? RDW 13.0 11.0 - 15.0 %  ? Platelets 304 140 - 400 Thousand/uL  ? MPV 10.2 7.5 - 12.5 fL  ? Neutro Abs 3,240 1,500 - 7,800 cells/uL  ? Lymphs Abs 1,550 850 - 3,900 cells/uL  ? Absolute Monocytes 369 200 - 950 cells/uL  ? Eosinophils Absolute 21 15 - 500 cells/uL  ? Basophils Absolute 21 0 - 200 cells/uL  ? Neutrophils Relative % 62.3 %   ? Total Lymphocyte 29.8 %  ? Monocytes Relative 7.1 %  ? Eosinophils Relative 0.4 %  ? Basophils Relative 0.4 %  ? ?Objective  ?Body mass index is 28.94 kg/m?. ?Wt Readings from Last 3 Encounters:  ?10/25/21 1

## 2021-10-29 ENCOUNTER — Ambulatory Visit: Payer: PPO

## 2021-10-29 DIAGNOSIS — H519 Unspecified disorder of binocular movement: Secondary | ICD-10-CM | POA: Diagnosis not present

## 2021-10-29 DIAGNOSIS — H04129 Dry eye syndrome of unspecified lacrimal gland: Secondary | ICD-10-CM | POA: Diagnosis not present

## 2021-10-29 DIAGNOSIS — H40013 Open angle with borderline findings, low risk, bilateral: Secondary | ICD-10-CM | POA: Diagnosis not present

## 2021-10-29 DIAGNOSIS — H532 Diplopia: Secondary | ICD-10-CM | POA: Diagnosis not present

## 2021-10-31 ENCOUNTER — Ambulatory Visit: Payer: PPO | Attending: Sports Medicine

## 2021-10-31 ENCOUNTER — Telehealth: Payer: Self-pay | Admitting: Internal Medicine

## 2021-10-31 DIAGNOSIS — M6281 Muscle weakness (generalized): Secondary | ICD-10-CM | POA: Diagnosis not present

## 2021-10-31 DIAGNOSIS — G8929 Other chronic pain: Secondary | ICD-10-CM | POA: Insufficient documentation

## 2021-10-31 DIAGNOSIS — M25511 Pain in right shoulder: Secondary | ICD-10-CM | POA: Diagnosis not present

## 2021-10-31 DIAGNOSIS — H8111 Benign paroxysmal vertigo, right ear: Secondary | ICD-10-CM | POA: Diagnosis not present

## 2021-10-31 NOTE — Therapy (Signed)
?OUTPATIENT PHYSICAL THERAPY TREATMENT NOTE ? ? ?Patient Name: Lynn Mann ?MRN: [id number] ?DOB:[birth year], 71 y.o., female ?Today's Date: 10/31/2021 ? ?PCP: Einar Pheasant, MD ?REFERRING PROVIDER: Diamond Nickel, DO ? ? PT End of Session - 10/31/21 0809   ? ? Visit Number [id number]   ? Number of Visits 53   ? Date for PT Re-Evaluation 11/29/21   ? Authorization Type 6   ? Authorization Time Period 10   ? PT Start Time 0809   pt arrived late  ? PT Stop Time 0086   ? PT Time Calculation (min) 38 min   ? Activity Tolerance Patient tolerated treatment well   ? Behavior During Therapy [facility] for tasks assessed/performed   ? ?  ?  ? ?  ? ? ?Past Medical History:  ?Diagnosis Date  ? Arthritis   ? C. difficile diarrhea   ? age 64s-40s   ? Chicken pox   ? Cholecystitis 11/2011  ? Did not require sgy - Dr. Staci Acosta - [facility]  (cholelithiasis)  ? H/O Clostridium difficile infection   ? IBS (irritable bowel syndrome)   ? MRSA exposure 2005  ? Spider bite  ? MVP (mitral valve prolapse)   ? Stable - Dr. Ubaldo Glassing  ? Rheumatic fever   ? ?Past Surgical History:  ?Procedure Laterality Date  ? CATARACT EXTRACTION  [id number]  ? MUSCLE BIOPSY    ? ?Patient Active Problem List  ? Diagnosis Date Noted  ? Cough 09/07/2021  ? Right shoulder pain 07/15/2021  ? Hematoma 02/11/2021  ? Ankle swelling 11/26/2020  ? Low back pain 11/22/2020  ? Hip pain, right 11/22/2020  ? Light headedness 06/24/2020  ? Hearing loss 06/24/2020  ? Change in vision 06/24/2020  ? Stress 05/17/2020  ? Chest pain 05/16/2020  ? Welcome to Medicare preventive visit 06/20/2019  ? Viral syndrome 01/30/2019  ? Itching 07/21/2017  ? Asthma 07/16/2016  ? Urinary incontinence 07/16/2016  ? SOB (shortness of breath) 04/01/2016  ? Bilateral shoulder pain 02/24/2016  ? Fatigue 01/28/2016  ? Neck fullness 01/28/2016  ? Routine general medical examination at a health care facility 07/25/2015  ? Health care maintenance 10/09/2014  ? Neck pain 11/26/2013  ? Hypercholesterolemia 11/26/2013  ?  History of colonic polyps 05/11/2013  ? Hyperbilirubinemia 01/18/2013  ? GERD (gastroesophageal reflux disease) 12/03/2012  ? Cholelithiasis 11/07/2012  ? IBS (irritable bowel syndrome) 11/07/2012  ? History of rheumatic fever 08/28/2012  ? MVP (mitral valve prolapse) 08/28/2012  ? ? ?REFERRING DIAG: Acute pain of R shoulder, Fall, anterior subluxation of sternoclavicular joint, arthritis of R sternoclavicular joint ? ?THERAPY DIAG:  ?Muscle weakness (generalized) ? ?PERTINENT HISTORY: R shoulder did not start really hurting until about a month after her fall. Just started with a little pain at the [facility] joint and clavicle. Sometimes she also feels pain along her R pectoralis muscle in the shape of a triangle. Pt fell June 2022. Pt got up to go to the bathroom and tripped over a suitcase. Pt fell against the garment rack on her R side which kind of shielded her R side. Pt also got a hematoma the size of a baked potato at her L leg afterwards. Pt hurt R toes during the fall which got better after 3 days. Went to urgent care for her leg. Hematoma has improved but still has a tiny knot in her L leg. Pt is R hand dominant. Pt was in PT around May to July 2022 for for her R  hip which is better. ? ?PRECAUTIONS: possible fall risk ? ?SUBJECTIVE: R shoulder is ok. Had the Epley maneuver and had nausea. Also saw something like double vision. Saw the eye doctor yesterday who wants her to have an MRI because the nerves in the R eye are not working right. Working on the neck and neck posture helps. Looking up as well as looking to the R changes her vision.  ? ? ? ?PAIN:  ?Are you having pain? 0.5/10 R shoulder as long as she does not move it in a certain direction.  ? ? ? ? ?Objectives ? ? ? ? ?  ?Manual therapy  ?STM to B cervical paraspinals, B upper traps to reduce muscle tension and R shoulder pain.  ? ?Seated STM R pectoralis muscle to decrease tension.  ? ?Therapeutic exercise:  ?    ?Standing R shoulder flexion  AROM ? ?Manually resisted R shoulder flexion, abduction, ER, IR 1x each way ? ?Reviewed progress/current status with PT towards goals.  ? ?Reviewed plan of care: continue 2x/week for 4 weeks to continue progress.  ? ?Seated chest fly (adduction): no resistance, elbows flexed (short lever arm) 3x10 from 45 deg abduction to hands touching together in front of body ? Reviewed and given as part of her HEP. Pt demonstrated and verbalized understanding. Handout provided.  ? ?Seated Cervical retractions: 6x5 second holds ?  ?  ?Seated R shoulder assisted flexion to end range with PT manual resistance to extension 10x3 ?             ? ? ?  ?  ?Improved exercise technique, movement at target joints, use of target muscles after mod verbal, visual, tactile cues.  ?             ? ? ?  ? ? ?  ?Response to treatment ?Fair tolerance to today's session ? ? ?Clinical impression ?Pt demonstrates overall improved R shoulder strength, function, decreased R shoulder pain, and significant improvement of R shoulder flexion AROM since initial evaluation. Pt making progress with PT towards goals. Pt will benefit from continued skilled physical therapy services to continue to decrease pain, improve ROM, strength and function and per pt input.  ? ? ? ? ? ? ? ? ? ? ? ?PATIENT EDUCATION: ?Education details: ther-ex ?Person educated: Patient ?Education method: Explanation, Demonstration, Tactile cues, and Verbal cues ?Education comprehension: verbalized understanding and returned demonstration ? ? ?HOME EXERCISE PROGRAM: ?Access Code: [email or web address] ?URL: [email or web address] ?Date: 10/16/2021 ?Prepared by: Joneen Boers ? ?Exercises ?Seated Scapular Retraction - 1 x daily - 7 x weekly - 3 sets - 10 reps - 5-15 seconds hold ?Sternocleidomastoid Stretch - 2 x daily - 7 x weekly - 1 sets - 5 reps - 10 seconds hold ?Isometric Shoulder Adduction - 1 x daily - 7 x weekly - 2-3 sets - 10 reps - 5 seconds hold ?Seated Shoulder Flexion AAROM  with Pulley Behind - 1 x daily - 7 x weekly - 3 sets - 10 reps ?Supine Chin Tuck - 1 x daily - 7 x weekly - 3 sets - 10 reps ?Supine Shoulder Horizontal Abduction and Adduction - 1 x daily - 7 x weekly - 3 sets - 10 reps ?Single Arm Shoulder Extension with Anchored Resistance - 1 x daily - 7 x weekly - 3 sets - 10 reps ?Shoulder Adduction with Anchored Resistance - 1 x daily - 7 x weekly - 3 sets - 10 reps ? ? PT Short Term Goals -  07/18/21 1111   ? ?  ? PT SHORT TERM GOAL #1  ? Title Pt will be independent with her initial HEP to decrease pain, improve strength, function, and ability to reach, push, and pull more comfortably.   ? Baseline Pt has started her HEP (05/22/2021); Pt performing her HEP. Has some questions but forgot what they are (06/25/2021), No questions with HEP, doing them (07/18/2021)   ? Time 3   ? Period Weeks   ? Status Achieved   ? Target Date 06/14/21   ? ?  ?  ? ?  ? ? ? PT Long Term Goals - 10/31/21 0814   ? ?  ? PT LONG TERM GOAL #1  ? Title Pt will have a decrease in R shoulder pain to 3/10 or less at worst to promote ability to reach, open and close the door, lift items more comfortably.   ? Baseline 10/10 R shoulder pain at worst for the past 2 months (05/22/2021); 6/10 at worst for the past 7 days (06/25/2021); 9-10/10 at worst for the past 7 days (07/18/2021); (07/24/2021); 08/20/21: Worst pain reported at 8/10 NPS.  8/10 at worst for the past 7 days (sudden and sharp ) 09/10/2021; 6-7/10 at most for the past 7 days. Did better when she took the muscle relaxers. Took muscle relaxers last night. Knocks her out.  (10/04/2021); 4-5/10 at worst for the past 7 days (10/31/2021)   ? Time 4   ? Period Weeks   ? Status Partially Met   ? Target Date 11/29/21   ?  ? PT LONG TERM GOAL #2  ? Title Pt will improve R shoulder flexion AROM to 150 degrees or more without pain to promote ability to reach.   ? Baseline 90 degrees R shoulder flexion AROM with pain (05/22/2021); 123 degrees flexion AROM, 147  degrees after resisted extension exercise (06/25/2021); 128 degrees flexion,, 97 degrees abduction  (07/18/2021); 36 degrees flexion (07/24/2021); 08/20/21: 135 degrees; 127 degrees flexion AROM (09/10/2021); 148 degrees

## 2021-10-31 NOTE — Telephone Encounter (Signed)
Pt want to check on MRI being ordered for her eyes. Pt want to know where it is headed so she can prepare ?

## 2021-11-01 ENCOUNTER — Ambulatory Visit: Payer: PPO

## 2021-11-01 DIAGNOSIS — M6281 Muscle weakness (generalized): Secondary | ICD-10-CM | POA: Diagnosis not present

## 2021-11-01 DIAGNOSIS — G8929 Other chronic pain: Secondary | ICD-10-CM

## 2021-11-01 NOTE — Therapy (Signed)
?OUTPATIENT PHYSICAL THERAPY TREATMENT NOTE ? ? ?Patient Name: Lynn Mann ?MRN: [id number] ?DOB:[birth year], 71 y.o., female ?Today's Date: 11/01/2021 ? ?PCP: Einar Pheasant, MD ?REFERRING PROVIDER: Diamond Nickel, DO ? ? PT End of Session - 11/01/21 4944   ? ? Visit Number 38   ? Number of Visits 53   ? Date for PT Re-Evaluation 11/29/21   ? Authorization Type 7   ? Authorization Time Period 10   ? PT Start Time [phone]   ? PT Stop Time 1017   ? PT Time Calculation (min) 39 min   ? Activity Tolerance Patient tolerated treatment well   ? Behavior During Therapy [facility] for tasks assessed/performed   ? ?  ?  ? ?  ? ? ? ?Past Medical History:  ?Diagnosis Date  ? Arthritis   ? C. difficile diarrhea   ? age 64s-40s   ? Chicken pox   ? Cholecystitis 11/2011  ? Did not require sgy - Dr. Staci Acosta - [facility]  (cholelithiasis)  ? H/O Clostridium difficile infection   ? IBS (irritable bowel syndrome)   ? MRSA exposure 2005  ? Spider bite  ? MVP (mitral valve prolapse)   ? Stable - Dr. Ubaldo Glassing  ? Rheumatic fever   ? ?Past Surgical History:  ?Procedure Laterality Date  ? CATARACT EXTRACTION  [id number]  ? MUSCLE BIOPSY    ? ?Patient Active Problem List  ? Diagnosis Date Noted  ? Cough 09/07/2021  ? Right shoulder pain 07/15/2021  ? Hematoma 02/11/2021  ? Ankle swelling 11/26/2020  ? Low back pain 11/22/2020  ? Hip pain, right 11/22/2020  ? Light headedness 06/24/2020  ? Hearing loss 06/24/2020  ? Change in vision 06/24/2020  ? Stress 05/17/2020  ? Chest pain 05/16/2020  ? Welcome to Medicare preventive visit 06/20/2019  ? Viral syndrome 01/30/2019  ? Itching 07/21/2017  ? Asthma 07/16/2016  ? Urinary incontinence 07/16/2016  ? SOB (shortness of breath) 04/01/2016  ? Bilateral shoulder pain 02/24/2016  ? Fatigue 01/28/2016  ? Neck fullness 01/28/2016  ? Routine general medical examination at a health care facility 07/25/2015  ? Health care maintenance 10/09/2014  ? Neck pain 11/26/2013  ? Hypercholesterolemia 11/26/2013  ? History of  colonic polyps 05/11/2013  ? Hyperbilirubinemia 01/18/2013  ? GERD (gastroesophageal reflux disease) 12/03/2012  ? Cholelithiasis 11/07/2012  ? IBS (irritable bowel syndrome) 11/07/2012  ? History of rheumatic fever 08/28/2012  ? MVP (mitral valve prolapse) 08/28/2012  ? ? ?REFERRING DIAG: Acute pain of R shoulder, Fall, anterior subluxation of sternoclavicular joint, arthritis of R sternoclavicular joint ? ?THERAPY DIAG:  ?Muscle weakness (generalized) ? ?PERTINENT HISTORY: R shoulder did not start really hurting until about a month after her fall. Just started with a little pain at the [facility] joint and clavicle. Sometimes she also feels pain along her R pectoralis muscle in the shape of a triangle. Pt fell June 2022. Pt got up to go to the bathroom and tripped over a suitcase. Pt fell against the garment rack on her R side which kind of shielded her R side. Pt also got a hematoma the size of a baked potato at her L leg afterwards. Pt hurt R toes during the fall which got better after 3 days. Went to urgent care for her leg. Hematoma has improved but still has a tiny knot in her L leg. Pt is R hand dominant. Pt was in PT around May to July 2022 for for her R hip which is  better. ? ?PRECAUTIONS: possible fall risk ? ?SUBJECTIVE: R shoulder is doing ok. No extreme pain, just some awkwardness with some movements. The feeling of spinning and the feeling out of balance is pretty much gone.  ? ? ? ? ?PAIN:  ?Are you having pain? Just a little uncomfortable.  ? ? ? ? ?Objectives ? ? ?Manual therapy   ?STM to B cervical paraspinals, B upper traps to reduce muscle tension and R shoulder pain.  ? ?Seated STM R pectoralis muscle to decrease tension.  ? ? ? ?Therapeutic exercise:  ?    ?Standing R shoulder extension yellow T-band 10x2, then 10x5 seconds  ? ?Standing R shoulder adduction yellow band 10x5 seconds for 3 sets ? ?Standing chin tucks 10x5 seconds for 3 sets ? ? ?Seated R shoulder assisted flexion to end range with PT  manual resistance to extension 10x3 ? ?Seated chest fly (adduction): no resistance, elbows flexed (short lever arm) 3x10 from 45 deg abduction to hands touching together in front of body ? ? ?  ?Improved exercise technique, movement at target joints, use of target muscles after mod verbal, visual, tactile cues.  ?             ? ? ?  ? ? ?  ?Response to treatment ?Pt tolerated session well without aggravation of symptoms.  ? ? ? ?Clinical impression ?Progressing well overall with R shoulder flexion and abduction AROM and comfort level. Improving cervical posture as well observed. Continued working on improving scapular and posterior shoulder strength to promote stability with movement. Pt tolerated session well without aggravation of symptoms. Pt will benefit from continued skilled physical therapy services to continue to decrease pain, improve ROM, strength and function and per pt input.  ? ? ? ? ? ? ? ? ? ? ? ?PATIENT EDUCATION: ?Education details: ther-ex ?Person educated: Patient ?Education method: Explanation, Demonstration, Tactile cues, and Verbal cues ?Education comprehension: verbalized understanding and returned demonstration ? ? ?HOME EXERCISE PROGRAM: ?Access Code: [email or web address] ?URL: [email or web address] ?Date: 10/16/2021 ?Prepared by: Joneen Boers ? ?Exercises ?Seated Scapular Retraction - 1 x daily - 7 x weekly - 3 sets - 10 reps - 5-15 seconds hold ?Sternocleidomastoid Stretch - 2 x daily - 7 x weekly - 1 sets - 5 reps - 10 seconds hold ?Isometric Shoulder Adduction - 1 x daily - 7 x weekly - 2-3 sets - 10 reps - 5 seconds hold ?Seated Shoulder Flexion AAROM with Pulley Behind - 1 x daily - 7 x weekly - 3 sets - 10 reps ?Supine Chin Tuck - 1 x daily - 7 x weekly - 3 sets - 10 reps ?Supine Shoulder Horizontal Abduction and Adduction - 1 x daily - 7 x weekly - 3 sets - 10 reps ?Single Arm Shoulder Extension with Anchored Resistance - 1 x daily - 7 x weekly - 3 sets - 10 reps ?Shoulder  Adduction with Anchored Resistance - 1 x daily - 7 x weekly - 3 sets - 10 reps ? ? PT Short Term Goals - 07/18/21 1111   ? ?  ? PT SHORT TERM GOAL #1  ? Title Pt will be independent with her initial HEP to decrease pain, improve strength, function, and ability to reach, push, and pull more comfortably.   ? Baseline Pt has started her HEP (05/22/2021); Pt performing her HEP. Has some questions but forgot what they are (06/25/2021), No questions with HEP, doing them (07/18/2021)   ? Time 3   ? Period Weeks   ?  Status Achieved   ? Target Date 06/14/21   ? ?  ?  ? ?  ? ? ? PT Long Term Goals - 10/31/21 0814   ? ?  ? PT LONG TERM GOAL #1  ? Title Pt will have a decrease in R shoulder pain to 3/10 or less at worst to promote ability to reach, open and close the door, lift items more comfortably.   ? Baseline 10/10 R shoulder pain at worst for the past 2 months (05/22/2021); 6/10 at worst for the past 7 days (06/25/2021); 9-10/10 at worst for the past 7 days (07/18/2021); (07/24/2021); 08/20/21: Worst pain reported at 8/10 NPS.  8/10 at worst for the past 7 days (sudden and sharp ) 09/10/2021; 6-7/10 at most for the past 7 days. Did better when she took the muscle relaxers. Took muscle relaxers last night. Knocks her out.  (10/04/2021); 4-5/10 at worst for the past 7 days (10/31/2021)   ? Time 4   ? Period Weeks   ? Status Partially Met   ? Target Date 11/29/21   ?  ? PT LONG TERM GOAL #2  ? Title Pt will improve R shoulder flexion AROM to 150 degrees or more without pain to promote ability to reach.   ? Baseline 90 degrees R shoulder flexion AROM with pain (05/22/2021); 123 degrees flexion AROM, 147 degrees after resisted extension exercise (06/25/2021); 128 degrees flexion,, 97 degrees abduction  (07/18/2021); 36 degrees flexion (07/24/2021); 08/20/21: 135 degrees; 127 degrees flexion AROM (09/10/2021); 148 degrees flexion with just a touch of pain at the top (3/92023); 161 degrees flexion AROM (10/31/2021)   ? Time 4   ? Period  Weeks   ? Status Achieved   ? Target Date 11/01/21   ?  ? PT LONG TERM GOAL #3  ? Title Pt will improve her R shoulder strength all planes by at least 1/2 MMT grade to promote ability to reach as well as lift items.

## 2021-11-01 NOTE — Telephone Encounter (Signed)
Did not see any imaging ordered in Patient's chart. Please advise   ?

## 2021-11-01 NOTE — Telephone Encounter (Signed)
I rec she call her eye doctor and sch appt  ?Ordered MRI brain to further work up?  ? ?

## 2021-11-01 NOTE — Addendum Note (Signed)
Addended by: Orland Mustard on: 11/01/2021 01:12 PM ? ? Modules accepted: Orders ? ?

## 2021-11-01 NOTE — Telephone Encounter (Signed)
Patient saw Dr Olivia Mackie 3/30. Not sure what needs to be ordered. ?

## 2021-11-06 ENCOUNTER — Telehealth: Payer: Self-pay | Admitting: Internal Medicine

## 2021-11-06 ENCOUNTER — Ambulatory Visit: Payer: PPO

## 2021-11-06 DIAGNOSIS — M6281 Muscle weakness (generalized): Secondary | ICD-10-CM | POA: Diagnosis not present

## 2021-11-06 DIAGNOSIS — G8929 Other chronic pain: Secondary | ICD-10-CM

## 2021-11-06 NOTE — Telephone Encounter (Signed)
Patient had questions about her MRI and would like a call. ?

## 2021-11-06 NOTE — Telephone Encounter (Signed)
S/w pt , advised mri is to r/o serious condition causing vertigo, double vision , dizziness ?

## 2021-11-06 NOTE — Telephone Encounter (Signed)
Patient called to schedule her MRI. ?

## 2021-11-06 NOTE — Therapy (Signed)
?OUTPATIENT PHYSICAL THERAPY TREATMENT NOTE ? ? ?Patient Name: Lynn Mann ?MRN: [id number] ?DOB:[birth year], 71 y.o., female ?Today's Date: 11/06/2021 ? ?PCP: Einar Pheasant, MD ?REFERRING PROVIDER: Diamond Nickel, DO ? ? PT End of Session - 11/06/21 0805   ? ? Visit Number [id number]   ? Number of Visits 53   ? Date for PT Re-Evaluation 11/29/21   ? Authorization Type 9   ? Authorization Time Period 10   ? PT Start Time [phone]   ? PT Stop Time 0845   ? PT Time Calculation (min) 39 min   ? Activity Tolerance Patient tolerated treatment well   ? Behavior During Therapy [facility] for tasks assessed/performed   ? ?  ?  ? ?  ? ? ? ? ?Past Medical History:  ?Diagnosis Date  ? Arthritis   ? C. difficile diarrhea   ? age 38s-40s   ? Chicken pox   ? Cholecystitis 11/2011  ? Did not require sgy - Dr. Staci Acosta - [facility]  (cholelithiasis)  ? H/O Clostridium difficile infection   ? IBS (irritable bowel syndrome)   ? MRSA exposure 2005  ? Spider bite  ? MVP (mitral valve prolapse)   ? Stable - Dr. Ubaldo Glassing  ? Rheumatic fever   ? ?Past Surgical History:  ?Procedure Laterality Date  ? CATARACT EXTRACTION  [id number]  ? MUSCLE BIOPSY    ? ?Patient Active Problem List  ? Diagnosis Date Noted  ? Cough 09/07/2021  ? Right shoulder pain 07/15/2021  ? Hematoma 02/11/2021  ? Ankle swelling 11/26/2020  ? Low back pain 11/22/2020  ? Hip pain, right 11/22/2020  ? Light headedness 06/24/2020  ? Hearing loss 06/24/2020  ? Change in vision 06/24/2020  ? Stress 05/17/2020  ? Chest pain 05/16/2020  ? Welcome to Medicare preventive visit 06/20/2019  ? Viral syndrome 01/30/2019  ? Itching 07/21/2017  ? Asthma 07/16/2016  ? Urinary incontinence 07/16/2016  ? SOB (shortness of breath) 04/01/2016  ? Bilateral shoulder pain 02/24/2016  ? Fatigue 01/28/2016  ? Neck fullness 01/28/2016  ? Routine general medical examination at a health care facility 07/25/2015  ? Health care maintenance 10/09/2014  ? Neck pain 11/26/2013  ? Hypercholesterolemia 11/26/2013  ? History of  colonic polyps 05/11/2013  ? Hyperbilirubinemia 01/18/2013  ? GERD (gastroesophageal reflux disease) 12/03/2012  ? Cholelithiasis 11/07/2012  ? IBS (irritable bowel syndrome) 11/07/2012  ? History of rheumatic fever 08/28/2012  ? MVP (mitral valve prolapse) 08/28/2012  ? ? ?REFERRING DIAG: Acute pain of R shoulder, Fall, anterior subluxation of sternoclavicular joint, arthritis of R sternoclavicular joint ? ?THERAPY DIAG:  ?Muscle weakness (generalized) ? ?PERTINENT HISTORY: R shoulder did not start really hurting until about a month after her fall. Just started with a little pain at the [facility] joint and clavicle. Sometimes she also feels pain along her R pectoralis muscle in the shape of a triangle. Pt fell June 2022. Pt got up to go to the bathroom and tripped over a suitcase. Pt fell against the garment rack on her R side which kind of shielded her R side. Pt also got a hematoma the size of a baked potato at her L leg afterwards. Pt hurt R toes during the fall which got better after 3 days. Went to urgent care for her leg. Hematoma has improved but still has a tiny knot in her L leg. Pt is R hand dominant. Pt was in PT around May to July 2022 for for her R hip which  is better. ? ?PRECAUTIONS: possible fall risk ? ?SUBJECTIVE: R shoulder is not good. Was sitting at a meeting yesterday and her R shoulder bothered her (posterior R upper trap). Could not turn her head to the R afterwards. No pain at rest, 3/10 R shoulder when raising her R arm up. Turning her head to the R bothers her neck.  ? ? ? ? ?PAIN:  ?Are you having pain? 3/10 ? ? ? ? ?Objectives ? ? ?Manual therapy ?STM to B cervical paraspinals, B upper traps to reduce muscle tension and R shoulder pain.  ? ? ? ?Therapeutic exercise:  ?    ?Seated R scalene muscle stretch with PT stabilizing R first rib 20 seconds x 5 ? Increased difficulty with R cervical rotation  ? ?Seated manually resisted scapular retraction targeting lower trap ? R 10x5 seconds for 3  sets ? L 10x5 seconds for 3 sets ? ?Standing chin tucks 10x5 seconds for 2 sets ? ?Seated R shoulder assisted flexion to end range with PT manual resistance to extension 10x3 ? ?Seated R shoulder assisted abduction to end range with PT manual resistance to adduction 10x2 ? ? ? ? ?  ?Improved exercise technique, movement at target joints, use of target muscles after mod verbal, visual, tactile cues.  ?             ? ? ?  ? ? ?  ?Response to treatment ?Fair tolerance to today's session.  ? ? ? ?Clinical impression ? ? ?Pt demonstrates recent exacerbation of neck pain after attending a meeting with R lateral neck and R medial shoulder blade discomfort with R cervical rotation. Worked on decreasing B upper trap muscle tension, improving lower trap muscle strength, upper thoracic extension to help address. Continued working on posterior shoulder strength to promote stability with R arm movement. Fair tolerated to today's session. Pt will benefit from continued skilled physical therapy services to continue to decrease pain, improve ROM, strength and function. ? ? ? ? ? ? ? ? ? ? ? ?PATIENT EDUCATION: ?Education details: ther-ex ?Person educated: Patient ?Education method: Explanation, Demonstration, Tactile cues, and Verbal cues ?Education comprehension: verbalized understanding and returned demonstration ? ? ?HOME EXERCISE PROGRAM: ?Access Code: [email or web address] ?URL: [email or web address] ?Date: 10/16/2021 ?Prepared by: Joneen Boers ? ?Exercises ?Seated Scapular Retraction - 1 x daily - 7 x weekly - 3 sets - 10 reps - 5-15 seconds hold ?Sternocleidomastoid Stretch - 2 x daily - 7 x weekly - 1 sets - 5 reps - 10 seconds hold ?Isometric Shoulder Adduction - 1 x daily - 7 x weekly - 2-3 sets - 10 reps - 5 seconds hold ?Seated Shoulder Flexion AAROM with Pulley Behind - 1 x daily - 7 x weekly - 3 sets - 10 reps ?Supine Chin Tuck - 1 x daily - 7 x weekly - 3 sets - 10 reps ?Supine Shoulder Horizontal Abduction and  Adduction - 1 x daily - 7 x weekly - 3 sets - 10 reps ?Single Arm Shoulder Extension with Anchored Resistance - 1 x daily - 7 x weekly - 3 sets - 10 reps ?Shoulder Adduction with Anchored Resistance - 1 x daily - 7 x weekly - 3 sets - 10 reps ? ? PT Short Term Goals - 07/18/21 1111   ? ?  ? PT SHORT TERM GOAL #1  ? Title Pt will be independent with her initial HEP to decrease pain, improve strength, function, and ability to reach, push, and pull more comfortably.   ?  Baseline Pt has started her HEP (05/22/2021); Pt performing her HEP. Has some questions but forgot what they are (06/25/2021), No questions with HEP, doing them (07/18/2021)   ? Time 3   ? Period Weeks   ? Status Achieved   ? Target Date 06/14/21   ? ?  ?  ? ?  ? ? ? PT Long Term Goals - 10/31/21 0814   ? ?  ? PT LONG TERM GOAL #1  ? Title Pt will have a decrease in R shoulder pain to 3/10 or less at worst to promote ability to reach, open and close the door, lift items more comfortably.   ? Baseline 10/10 R shoulder pain at worst for the past 2 months (05/22/2021); 6/10 at worst for the past 7 days (06/25/2021); 9-10/10 at worst for the past 7 days (07/18/2021); (07/24/2021); 08/20/21: Worst pain reported at 8/10 NPS.  8/10 at worst for the past 7 days (sudden and sharp ) 09/10/2021; 6-7/10 at most for the past 7 days. Did better when she took the muscle relaxers. Took muscle relaxers last night. Knocks her out.  (10/04/2021); 4-5/10 at worst for the past 7 days (10/31/2021)   ? Time 4   ? Period Weeks   ? Status Partially Met   ? Target Date 11/29/21   ?  ? PT LONG TERM GOAL #2  ? Title Pt will improve R shoulder flexion AROM to 150 degrees or more without pain to promote ability to reach.   ? Baseline 90 degrees R shoulder flexion AROM with pain (05/22/2021); 123 degrees flexion AROM, 147 degrees after resisted extension exercise (06/25/2021); 128 degrees flexion,, 97 degrees abduction  (07/18/2021); 36 degrees flexion (07/24/2021); 08/20/21: 135 degrees;  127 degrees flexion AROM (09/10/2021); 148 degrees flexion with just a touch of pain at the top (3/92023); 161 degrees flexion AROM (10/31/2021)   ? Time 4   ? Period Weeks   ? Status Achieved   ? Target Date 04/06

## 2021-11-08 ENCOUNTER — Ambulatory Visit: Payer: PPO

## 2021-11-08 DIAGNOSIS — M6281 Muscle weakness (generalized): Secondary | ICD-10-CM

## 2021-11-08 DIAGNOSIS — G8929 Other chronic pain: Secondary | ICD-10-CM

## 2021-11-08 NOTE — Therapy (Signed)
?OUTPATIENT PHYSICAL THERAPY TREATMENT NOTE ?And ?Progress Report ?(10/04/2021 - 11/08/2021) ? ? ?Patient Name: Lynn Mann ?MRN: [id number] ?DOB:1951-05-15, 71 y.o., female ?Today's Date: 11/08/2021 ? ?PCP: Einar Pheasant, MD ?REFERRING PROVIDER: Diamond Nickel, DO ? ? PT End of Session - 11/08/21 0804   ? ? Visit Number 40   ? Number of Visits 53   ? Date for PT Re-Evaluation 11/29/21   ? Authorization Type 10   ? Authorization Time Period 10   ? PT Start Time [phone]   ? PT Stop Time 0845   ? PT Time Calculation (min) 41 min   ? Activity Tolerance Patient tolerated treatment well   ? Behavior During Therapy [facility] for tasks assessed/performed   ? ?  ?  ? ?  ? ? ? ? ? ?Past Medical History:  ?Diagnosis Date  ? Arthritis   ? C. difficile diarrhea   ? age 61s-40s   ? Chicken pox   ? Cholecystitis 11/2011  ? Did not require sgy - Dr. Staci Acosta - [facility]  (cholelithiasis)  ? H/O Clostridium difficile infection   ? IBS (irritable bowel syndrome)   ? MRSA exposure 2005  ? Spider bite  ? MVP (mitral valve prolapse)   ? Stable - Dr. Ubaldo Glassing  ? Rheumatic fever   ? ?Past Surgical History:  ?Procedure Laterality Date  ? CATARACT EXTRACTION  [id number]  ? MUSCLE BIOPSY    ? ?Patient Active Problem List  ? Diagnosis Date Noted  ? Cough 09/07/2021  ? Right shoulder pain 07/15/2021  ? Hematoma 02/11/2021  ? Ankle swelling 11/26/2020  ? Low back pain 11/22/2020  ? Hip pain, right 11/22/2020  ? Light headedness 06/24/2020  ? Hearing loss 06/24/2020  ? Change in vision 06/24/2020  ? Stress 05/17/2020  ? Chest pain 05/16/2020  ? Welcome to Medicare preventive visit 06/20/2019  ? Viral syndrome 01/30/2019  ? Itching 07/21/2017  ? Asthma 07/16/2016  ? Urinary incontinence 07/16/2016  ? SOB (shortness of breath) 04/01/2016  ? Bilateral shoulder pain 02/24/2016  ? Fatigue 01/28/2016  ? Neck fullness 01/28/2016  ? Routine general medical examination at a health care facility 07/25/2015  ? Health care maintenance 10/09/2014  ? Neck pain 11/26/2013   ? Hypercholesterolemia 11/26/2013  ? History of colonic polyps 05/11/2013  ? Hyperbilirubinemia 01/18/2013  ? GERD (gastroesophageal reflux disease) 12/03/2012  ? Cholelithiasis 11/07/2012  ? IBS (irritable bowel syndrome) 11/07/2012  ? History of rheumatic fever 08/28/2012  ? MVP (mitral valve prolapse) 08/28/2012  ? ? ?REFERRING DIAG: Acute pain of R shoulder, Fall, anterior subluxation of sternoclavicular joint, arthritis of R sternoclavicular joint ? ?THERAPY DIAG:  ?Muscle weakness (generalized) ? ?PERTINENT HISTORY: R shoulder did not start really hurting until about a month after her fall. Just started with a little pain at the [facility] joint and clavicle. Sometimes she also feels pain along her R pectoralis muscle in the shape of a triangle. Pt fell June 2022. Pt got up to go to the bathroom and tripped over a suitcase. Pt fell against the garment rack on her R side which kind of shielded her R side. Pt also got a hematoma the size of a baked potato at her L leg afterwards. Pt hurt R toes during the fall which got better after 3 days. Went to urgent care for her leg. Hematoma has improved but still has a tiny knot in her L leg. Pt is R hand dominant. Pt was in PT around May to July  2022 for for her R hip which is better. ? ?PRECAUTIONS: possible fall risk ? ?SUBJECTIVE: R shoulder is ok. No pain currently at rest but has symptoms when she protracts her shoulder.  ? ? ? ? ?PAIN:  ?Are you having pain? 0/10 ? ? ? ? ?Objectives ? ? ?Manual therapy   ?Seated STM to L and R upper trap and cervical paraspinal areas to decrease fascial restrictions ? ? ?STM to B cervical paraspinals, B upper traps to reduce muscle tension and R shoulder pain.  ? ? ? ?Therapeutic exercise:  ?    ?Standing R shoulder flexion AROM 1x ? ?Manually resisted R shoulder flexion, abduction, ER, IR 1x each way ? ?Reviewed progress/current status with PT towards goals ? ? ?Seated R shoulder assisted flexion to end range with PT manual resistance  to extension 10x3 ? ?Seated manually resisted scapular retraction targeting lower trap ? R 10x5 seconds ? L 10x5 seconds  ? ?Standing chin tucks 10x5 seconds  ? ?Seated R shoulder assisted abduction to end range with PT manual resistance to adduction 10x2 ? ? ? ? ?  ?Improved exercise technique, movement at target joints, use of target muscles after mod verbal, visual, tactile cues.  ?              ?Response to treatment ?Fair tolerance to today's session.  ? ? ? ?Clinical impression ?Pt demonstrates improved R shoulder strength, AROM, and decreased overall pain. Pt making progress with PT towards goals. Continued working on scapular and posterior shoulder strength to promote stability with R arm movements. Fair tolerance to today's session. Pt will benefit from continued skilled physical therapy services to continue to decrease pain, improve ROM, strength and function. ? ? ? ? ? ? ? ? ? ? ? ?PATIENT EDUCATION: ?Education details: ther-ex ?Person educated: Patient ?Education method: Explanation, Demonstration, Tactile cues, and Verbal cues ?Education comprehension: verbalized understanding and returned demonstration ? ? ?HOME EXERCISE PROGRAM: ?Access Code: [email or web address] ?URL: [email or web address] ?Date: 10/16/2021 ?Prepared by: Joneen Boers ? ?Exercises ?Seated Scapular Retraction - 1 x daily - 7 x weekly - 3 sets - 10 reps - 5-15 seconds hold ?Sternocleidomastoid Stretch - 2 x daily - 7 x weekly - 1 sets - 5 reps - 10 seconds hold ?Isometric Shoulder Adduction - 1 x daily - 7 x weekly - 2-3 sets - 10 reps - 5 seconds hold ?Seated Shoulder Flexion AAROM with Pulley Behind - 1 x daily - 7 x weekly - 3 sets - 10 reps ?Supine Chin Tuck - 1 x daily - 7 x weekly - 3 sets - 10 reps ?Supine Shoulder Horizontal Abduction and Adduction - 1 x daily - 7 x weekly - 3 sets - 10 reps ?Single Arm Shoulder Extension with Anchored Resistance - 1 x daily - 7 x weekly - 3 sets - 10 reps ?Shoulder Adduction with Anchored  Resistance - 1 x daily - 7 x weekly - 3 sets - 10 reps ? ? PT Short Term Goals - 07/18/21 1111   ? ?  ? PT SHORT TERM GOAL #1  ? Title Pt will be independent with her initial HEP to decrease pain, improve strength, function, and ability to reach, push, and pull more comfortably.   ? Baseline Pt has started her HEP (05/22/2021); Pt performing her HEP. Has some questions but forgot what they are (06/25/2021), No questions with HEP, doing them (07/18/2021)   ? Time 3   ? Period Weeks   ? Status Achieved   ?  Target Date 06/14/21   ? ?  ?  ? ?  ? ? ? PT Long Term Goals - 11/08/21 0808   ? ?  ? PT LONG TERM GOAL #1  ? Title Pt will have a decrease in R shoulder pain to 3/10 or less at worst to promote ability to reach, open and close the door, lift items more comfortably.   ? Baseline 10/10 R shoulder pain at worst for the past 2 months (05/22/2021); 6/10 at worst for the past 7 days (06/25/2021); 9-10/10 at worst for the past 7 days (07/18/2021); (07/24/2021); 08/20/21: Worst pain reported at 8/10 NPS.  8/10 at worst for the past 7 days (sudden and sharp ) 09/10/2021; 6-7/10 at most for the past 7 days. Did better when she took the muscle relaxers. Took muscle relaxers last night. Knocks her out.  (10/04/2021); 4-5/10 at worst for the past 7 days (10/31/2021); 2/10 R shoulder/clavicle pain at most for the past 7 days (11/08/2021)   ? Time 4   ? Period Weeks   ? Status Achieved   ? Target Date 11/29/21   ?  ? PT LONG TERM GOAL #2  ? Title Pt will improve R shoulder flexion AROM to 150 degrees or more without pain to promote ability to reach.   ? Baseline 90 degrees R shoulder flexion AROM with pain (05/22/2021); 123 degrees flexion AROM, 147 degrees after resisted extension exercise (06/25/2021); 128 degrees flexion,, 97 degrees abduction  (07/18/2021); 36 degrees flexion (07/24/2021); 08/20/21: 135 degrees; 127 degrees flexion AROM (09/10/2021); 148 degrees flexion with just a touch of pain at the top (3/92023); 161 degrees  flexion AROM (10/31/2021); 155 degrees (prior to treatment (11/08/2021)   ? Time 4   ? Period Weeks   ? Status Achieved   ? Target Date 11/01/21   ?  ? PT LONG TERM GOAL #3  ? Title Pt will improve her R shoulder

## 2021-11-11 ENCOUNTER — Ambulatory Visit
Admission: RE | Admit: 2021-11-11 | Discharge: 2021-11-11 | Disposition: A | Discharge status: Home / Self Care | Payer: PPO | Source: Ambulatory Visit | Attending: Internal Medicine | Admitting: Internal Medicine

## 2021-11-11 DIAGNOSIS — H532 Diplopia: Secondary | ICD-10-CM | POA: Diagnosis not present

## 2021-11-11 DIAGNOSIS — I611 Nontraumatic intracerebral hemorrhage in hemisphere, cortical: Secondary | ICD-10-CM | POA: Diagnosis not present

## 2021-11-11 DIAGNOSIS — H8113 Benign paroxysmal vertigo, bilateral: Secondary | ICD-10-CM

## 2021-11-11 DIAGNOSIS — H6993 Unspecified Eustachian tube disorder, bilateral: Secondary | ICD-10-CM

## 2021-11-11 DIAGNOSIS — H6983 Other specified disorders of Eustachian tube, bilateral: Secondary | ICD-10-CM | POA: Diagnosis not present

## 2021-11-11 DIAGNOSIS — I6782 Cerebral ischemia: Secondary | ICD-10-CM | POA: Diagnosis not present

## 2021-11-11 IMAGING — MR MR HEAD W/O CM
11 series · 45 of 48 positions shown · non-contrast
Comparison: Head CT [DATE].  Brain MRI [DATE].

CLINICAL DATA: Provided history: Dizziness, persistent/recurrent,
cardiac or vascular cause suspected. A written. Benign paroxysmal
positional vertigo due to bilateral vestibular disorder. Dysfunction
of both eustachian tubes. Rule out stroke.

EXAM:
MRI HEAD WITHOUT CONTRAST
TECHNIQUE: Multiplanar, multiecho pulse sequences of the brain and surrounding
structures were obtained without intravenous contrast.

[Series 5: ax dwi_tracew · axial · 3.0mm · 0.65mm/px · z∈[-37,+117]mm · 5 of 48 slices shown]
[im 1/48]
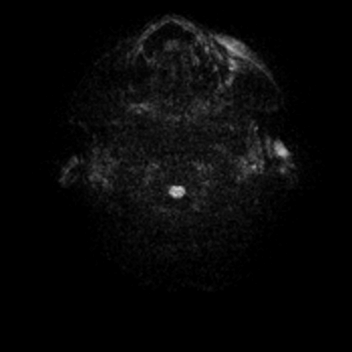
[im 12/48]
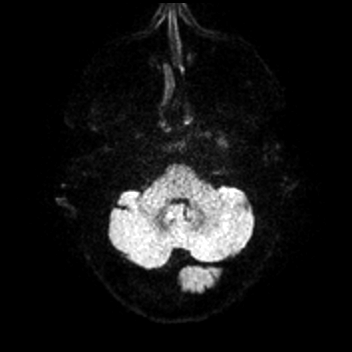
[im 24/48]
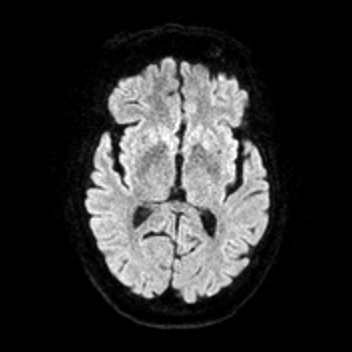
[im 36/48]
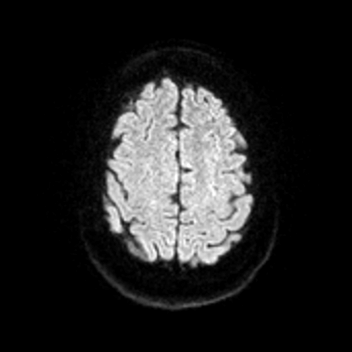
[im 48/48]
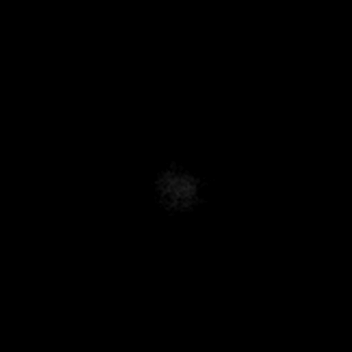

[Series 6: ax dwi_adc · axial · 3.0mm · 0.65mm/px · z∈[-37,+111]mm · 3 of 45 slices shown]
[im 1/45]
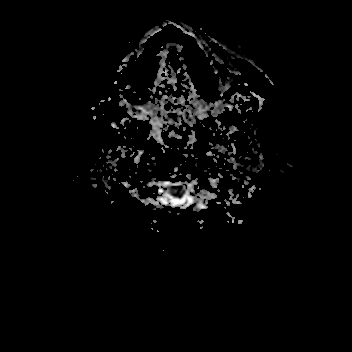
[im 23/45]
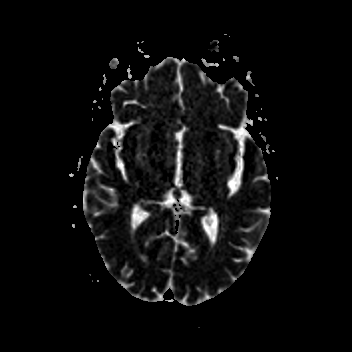
[im 45/45]
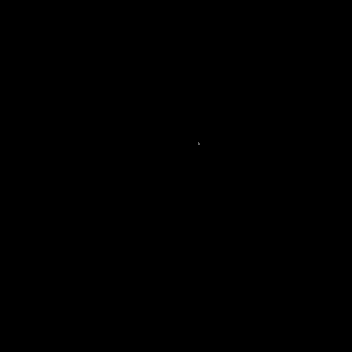

[Series 7: cor dwi_tracew · coronal · 5.0mm · 0.60mm/px · 3 of 38 slices shown]
[im 1/38]
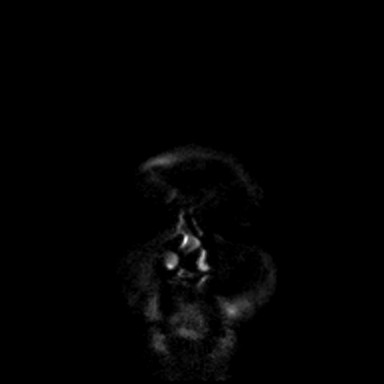
[im 19/38]
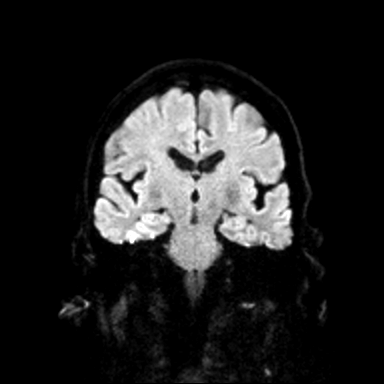
[im 38/38]
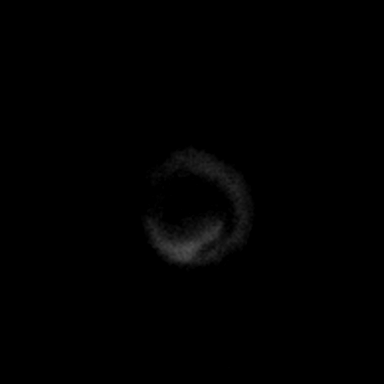

[Series 8: cor dwi_adc · coronal · 5.0mm · 0.60mm/px · 3 of 37 slices shown]
[im 1/37]
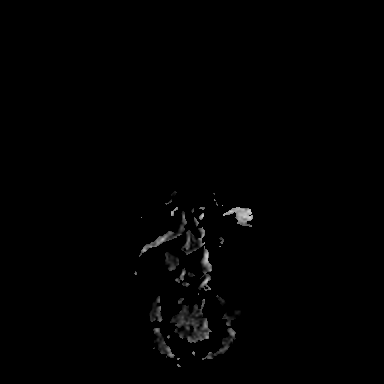
[im 19/37]
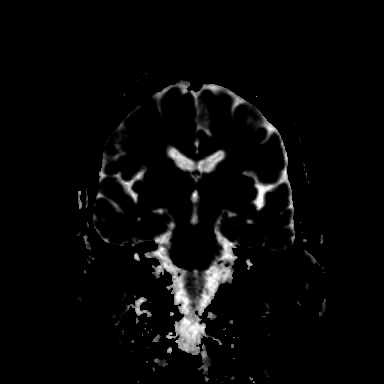
[im 37/37]
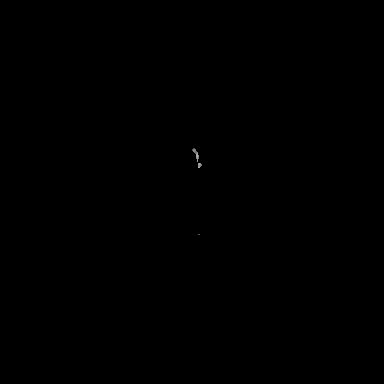

[Series 9: T1 · sagittal · 5.0mm · 0.62mm/px · 2 of 21 slices shown (1 of 2)]
[im 1/21]
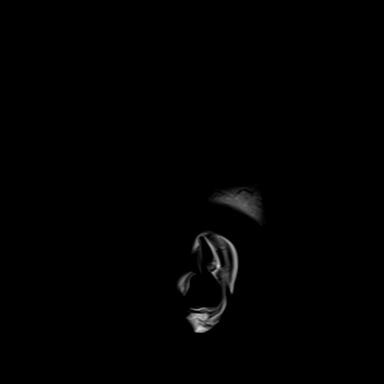
[im 21/21]
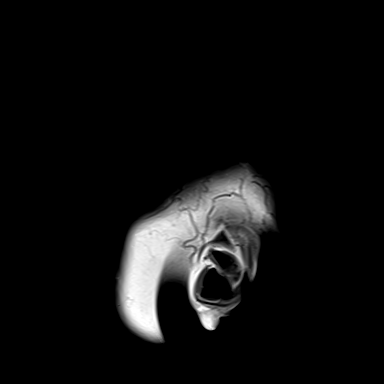

[Series 10: T2 · axial · 5.0mm · 0.53mm/px · z∈[-39,+116]mm · 2 of 27 slices shown (1 of 2)]
[im 1/27]
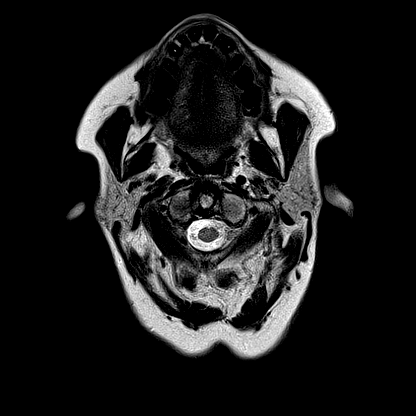
[im 27/27]
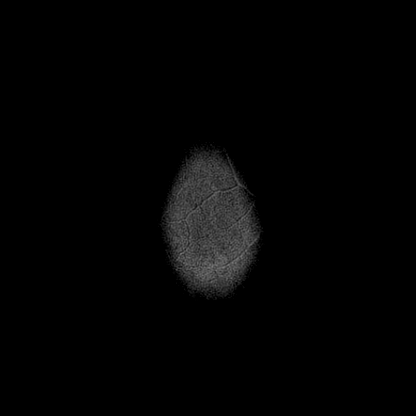

[Series 12: ax swi_pha · axial · 2.0mm · 0.90mm/px · z∈[-40,+117]mm · 6 of 80 slices shown]
[im 1/80]
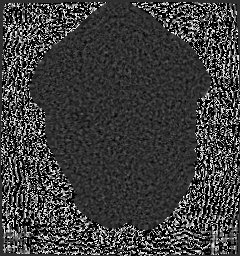
[im 16/80]
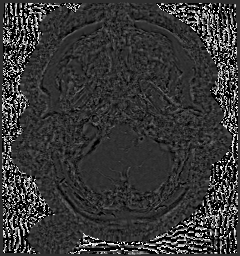
[im 32/80]
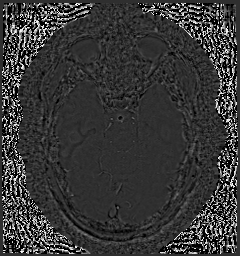
[im 48/80]
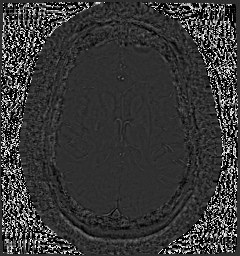
[im 64/80]
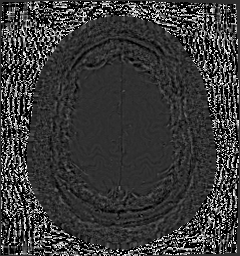
[im 80/80]
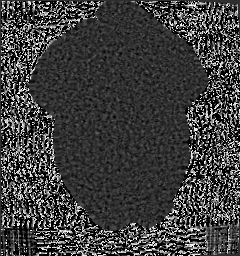

[Series 13: ax swi_swi · axial · 2.0mm · 0.90mm/px · z∈[-40,+117]mm · 6 of 80 slices shown]
[im 1/80]
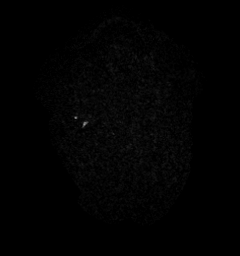
[im 16/80]
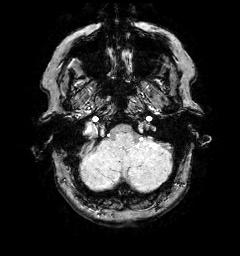
[im 32/80]
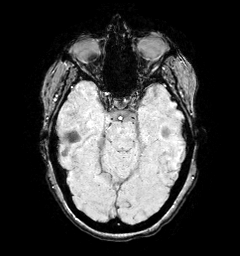
[im 48/80]
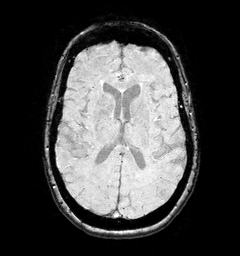
[im 64/80]
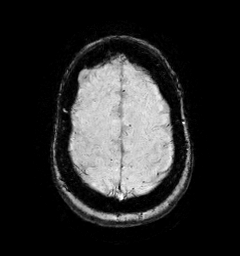
[im 80/80]
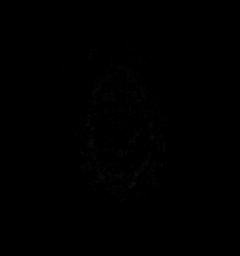

[Series 15: FLAIR · axial · 3.0mm · 0.53mm/px · z∈[-42,+119]mm · 4 of 55 slices shown]
[im 1/55]
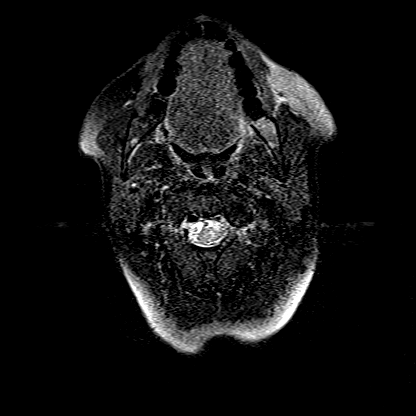
[im 19/55]
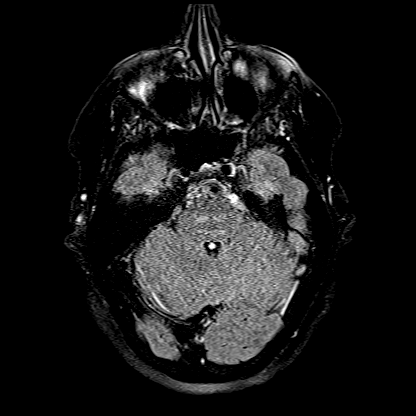
[im 37/55]
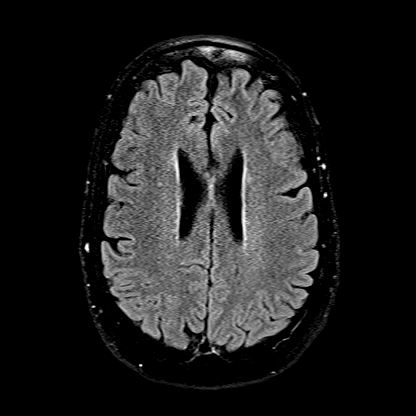
[im 55/55]
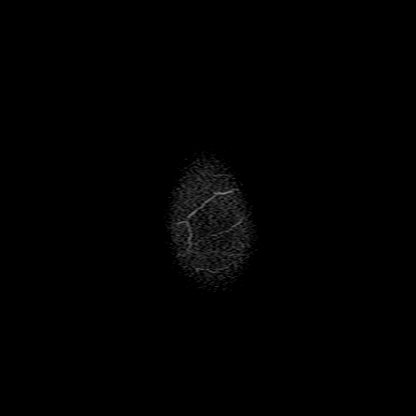

[Series 16: T1 · axial · 1.0mm · 0.98mm/px · z∈[-39,+119]mm · 9 of 160 slices shown (2 of 2)]
[im 1/160]
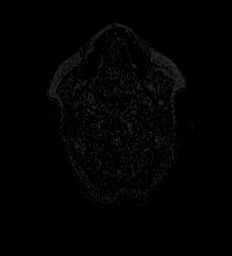
[im 15/160]
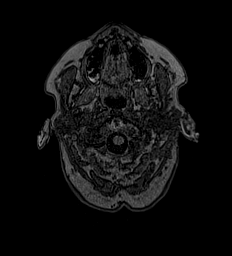
[im 29/160]
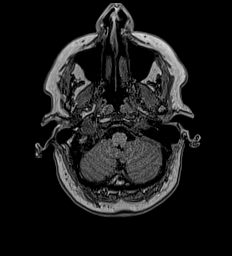
[im 44/160]
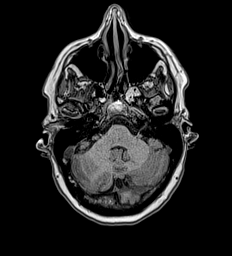
[im 73/160]
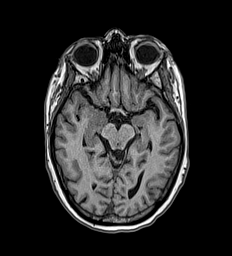
[im 87/160]
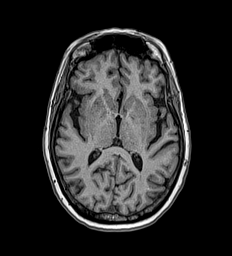
[im 116/160]
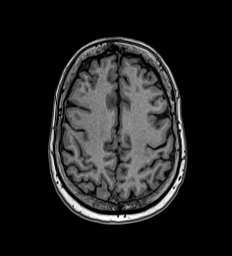
[im 131/160]
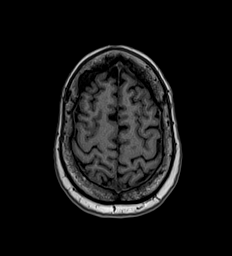
[im 160/160]
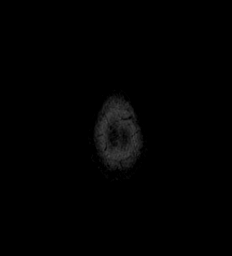

[Series 17: T2 · coronal · 5.0mm · 0.57mm/px · 2 of 29 slices shown (2 of 2)]
[im 1/29]
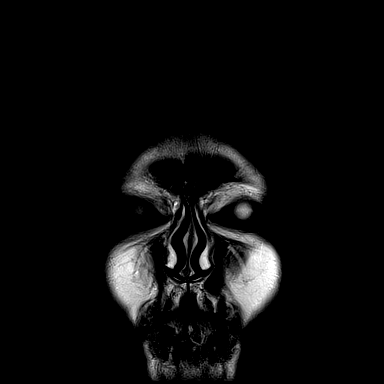
[im 29/29]
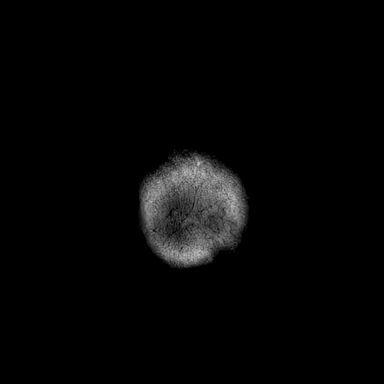

[45 of 48 positions shown; findings below may reference images not displayed]

FINDINGS: Brain:

Cerebral volume is normal.

Mild multifocal T2 FLAIR hyperintense signal abnormality within the
cerebral white matter and pons, nonspecific but compatible with
chronic small vessel ischemic disease.

As before, there are multiple small foci of SWI signal loss within
the bilateral basal ganglia, likely reflecting mineralization.
Redemonstrated chronic parenchymal microhemorrhage within the
inferomedial right parietal lobe.

There is no acute infarct.

No evidence of an intracranial mass.

No chronic intracranial blood products.

No extra-axial fluid collection.

No midline shift.

Vascular: Maintained flow voids within the proximal large arterial
vessels.

Skull and upper cervical spine: No focal suspicious marrow lesion.

Sinuses/Orbits: Visualized orbits show no acute finding. Prior
bilateral ocular lens replacement.

Other: No significant paranasal sinus disease.
IMPRESSION: No evidence of acute intracranial abnormality.

Mild chronic small vessel ischemic changes within the cerebral white
matter, stable from the prior brain MRI of [DATE].

## 2021-11-12 ENCOUNTER — Ambulatory Visit: Payer: PPO

## 2021-11-12 ENCOUNTER — Telehealth: Payer: Self-pay

## 2021-11-12 DIAGNOSIS — M6281 Muscle weakness (generalized): Secondary | ICD-10-CM

## 2021-11-12 DIAGNOSIS — G8929 Other chronic pain: Secondary | ICD-10-CM

## 2021-11-12 NOTE — Therapy (Signed)
?OUTPATIENT PHYSICAL THERAPY TREATMENT NOTE ? ? ? ?Patient Name: Lynn Mann ?MRN: [id number] ?DOB:[birth year], 71 y.o., female ?Today's Date: 11/12/2021 ? ?PCP: Einar Pheasant, MD ?REFERRING PROVIDER: Diamond Nickel, DO ? ? PT End of Session - 11/12/21 6789   ? ? Visit Number [id number]   ? Number of Visits 53   ? Date for PT Re-Evaluation 11/29/21   ? Authorization Type 1   ? Authorization Time Period 10   ? PT Start Time [phone]   PT arrived late  ? PT Stop Time 1751   ? PT Time Calculation (min) 40 min   ? Activity Tolerance Patient tolerated treatment well   ? Behavior During Therapy [facility] for tasks assessed/performed   ? ?  ?  ? ?  ? ? ? ? ? ? ?Past Medical History:  ?Diagnosis Date  ? Arthritis   ? C. difficile diarrhea   ? age 39s-40s   ? Chicken pox   ? Cholecystitis 11/2011  ? Did not require sgy - Dr. Staci Acosta - [facility]  (cholelithiasis)  ? H/O Clostridium difficile infection   ? IBS (irritable bowel syndrome)   ? MRSA exposure 2005  ? Spider bite  ? MVP (mitral valve prolapse)   ? Stable - Dr. Ubaldo Glassing  ? Rheumatic fever   ? ?Past Surgical History:  ?Procedure Laterality Date  ? CATARACT EXTRACTION  [id number]  ? MUSCLE BIOPSY    ? ?Patient Active Problem List  ? Diagnosis Date Noted  ? Cough 09/07/2021  ? Right shoulder pain 07/15/2021  ? Hematoma 02/11/2021  ? Ankle swelling 11/26/2020  ? Low back pain 11/22/2020  ? Hip pain, right 11/22/2020  ? Light headedness 06/24/2020  ? Hearing loss 06/24/2020  ? Change in vision 06/24/2020  ? Stress 05/17/2020  ? Chest pain 05/16/2020  ? Welcome to Medicare preventive visit 06/20/2019  ? Viral syndrome 01/30/2019  ? Itching 07/21/2017  ? Asthma 07/16/2016  ? Urinary incontinence 07/16/2016  ? SOB (shortness of breath) 04/01/2016  ? Bilateral shoulder pain 02/24/2016  ? Fatigue 01/28/2016  ? Neck fullness 01/28/2016  ? Routine general medical examination at a health care facility 07/25/2015  ? Health care maintenance 10/09/2014  ? Neck pain 11/26/2013  ? Hypercholesterolemia  11/26/2013  ? History of colonic polyps 05/11/2013  ? Hyperbilirubinemia 01/18/2013  ? GERD (gastroesophageal reflux disease) 12/03/2012  ? Cholelithiasis 11/07/2012  ? IBS (irritable bowel syndrome) 11/07/2012  ? History of rheumatic fever 08/28/2012  ? MVP (mitral valve prolapse) 08/28/2012  ? ? ?REFERRING DIAG: Acute pain of R shoulder, Fall, anterior subluxation of sternoclavicular joint, arthritis of R sternoclavicular joint ? ?THERAPY DIAG:  ?Muscle weakness (generalized) ? ?PERTINENT HISTORY: R shoulder did not start really hurting until about a month after her fall. Just started with a little pain at the [place] joint and clavicle. Sometimes she also feels pain along her R pectoralis muscle in the shape of a triangle. Pt fell June 2022. Pt got up to go to the bathroom and tripped over a suitcase. Pt fell against the garment rack on her R side which kind of shielded her R side. Pt also got a hematoma the size of a baked potato at her L leg afterwards. Pt hurt R toes during the fall which got better after 3 days. Went to urgent care for her leg. Hematoma has improved but still has a tiny knot in her L leg. Pt is R hand dominant. Pt was in PT around May to July  2022 for for her R hip which is better. ? ?PRECAUTIONS: possible fall risk ? ?SUBJECTIVE: Had a brain MRI 1 pm yesterday, pt was supine and her R shoulder is bothering her. 0.5/10 at rest, 1/10 when raising her R arm up.  ? ? ?R shoulder is ok. No pain currently at rest but has symptoms when she protracts her shoulder.  ? ? ? ? ?PAIN:  ?Are you having pain? 1/10 ? ? ? ? ?Objectives ? ? ?Manual therapy  ? ? ?STM to B cervical paraspinals, B upper traps to reduce muscle tension and R shoulder pain.  ? ? ?Therapeutic exercise:  ?    ? ?Seated manually resisted scapular retraction targeting lower trap ? R 10x5 seconds for 3 sets ? L 10x5 seconds for 3 sets ? ?Standing chin tucks 10x5 seconds for 2 sets ? ?Seated R shoulder assisted flexion to end range with PT  manual resistance to extension 10x3 ? ?Seated R shoulder assisted abduction to end range with PT manual resistance to adduction 10x2 ? ?R shoulder ER yellow band 10x3 with scapular retraction ? Decreased R anterior shoulder clicking with abduction afterwards.  ? ? ?Seated manually resisted R triceps extension isometric in neutral 10x5 seconds for 3 sets ? ? ? ? ? ? ? ?  ?Improved exercise technique, movement at target joints, use of target muscles after mod verbal, visual, tactile cues.  ?              ?Response to treatment ?Pt tolerated session well without aggravation of symptoms.  ? ? ? ?Clinical impression ? ?Continued working on scapular and posterior shoulder strength to promote stability with R arm movements. Continued working on STM to decrease cervical paraspinal and upper trap muscle tension to her neck. Pt tolerated session well without aggravation of symptoms. Pt will benefit from continued skilled physical therapy services to continue to decrease pain, improve ROM, strength and function. ? ? ? ? ? ?PATIENT EDUCATION: ?Education details: ther-ex ?Person educated: Patient ?Education method: Explanation, Demonstration, Tactile cues, and Verbal cues ?Education comprehension: verbalized understanding and returned demonstration ? ? ?HOME EXERCISE PROGRAM: ?Access Code: [email or web address] ?URL: [email or web address] ?Date: 10/16/2021 ?Prepared by: Joneen Boers ? ?Exercises ?Seated Scapular Retraction - 1 x daily - 7 x weekly - 3 sets - 10 reps - 5-15 seconds hold ?Sternocleidomastoid Stretch - 2 x daily - 7 x weekly - 1 sets - 5 reps - 10 seconds hold ?Isometric Shoulder Adduction - 1 x daily - 7 x weekly - 2-3 sets - 10 reps - 5 seconds hold ?Seated Shoulder Flexion AAROM with Pulley Behind - 1 x daily - 7 x weekly - 3 sets - 10 reps ?Supine Chin Tuck - 1 x daily - 7 x weekly - 3 sets - 10 reps ?Supine Shoulder Horizontal Abduction and Adduction - 1 x daily - 7 x weekly - 3 sets - 10 reps ?Single Arm  Shoulder Extension with Anchored Resistance - 1 x daily - 7 x weekly - 3 sets - 10 reps ?Shoulder Adduction with Anchored Resistance - 1 x daily - 7 x weekly - 3 sets - 10 reps ? ? PT Short Term Goals - 07/18/21 1111   ? ?  ? PT SHORT TERM GOAL #1  ? Title Pt will be independent with her initial HEP to decrease pain, improve strength, function, and ability to reach, push, and pull more comfortably.   ? Baseline Pt has started her HEP (05/22/2021); Pt performing her HEP. Has  some questions but forgot what they are (06/25/2021), No questions with HEP, doing them (07/18/2021)   ? Time 3   ? Period Weeks   ? Status Achieved   ? Target Date 06/14/21   ? ?  ?  ? ?  ? ? ? PT Long Term Goals - 11/08/21 0808   ? ?  ? PT LONG TERM GOAL #1  ? Title Pt will have a decrease in R shoulder pain to 3/10 or less at worst to promote ability to reach, open and close the door, lift items more comfortably.   ? Baseline 10/10 R shoulder pain at worst for the past 2 months (05/22/2021); 6/10 at worst for the past 7 days (06/25/2021); 9-10/10 at worst for the past 7 days (07/18/2021); (07/24/2021); 08/20/21: Worst pain reported at 8/10 NPS.  8/10 at worst for the past 7 days (sudden and sharp ) 09/10/2021; 6-7/10 at most for the past 7 days. Did better when she took the muscle relaxers. Took muscle relaxers last night. Knocks her out.  (10/04/2021); 4-5/10 at worst for the past 7 days (10/31/2021); 2/10 R shoulder/clavicle pain at most for the past 7 days (11/08/2021)   ? Time 4   ? Period Weeks   ? Status Achieved   ? Target Date 11/29/21   ?  ? PT LONG TERM GOAL #2  ? Title Pt will improve R shoulder flexion AROM to 150 degrees or more without pain to promote ability to reach.   ? Baseline 90 degrees R shoulder flexion AROM with pain (05/22/2021); 123 degrees flexion AROM, 147 degrees after resisted extension exercise (06/25/2021); 128 degrees flexion,, 97 degrees abduction  (07/18/2021); 36 degrees flexion (07/24/2021); 08/20/21: 135 degrees;  127 degrees flexion AROM (09/10/2021); 148 degrees flexion with just a touch of pain at the top (3/92023); 161 degrees flexion AROM (10/31/2021); 155 degrees (prior to treatment (11/08/2021)   ? Time 4   ? Period

## 2021-11-12 NOTE — Telephone Encounter (Signed)
Lvm for pt to return call in regards to MRI results.  ?

## 2021-11-13 ENCOUNTER — Telehealth: Payer: Self-pay | Admitting: Internal Medicine

## 2021-11-13 ENCOUNTER — Other Ambulatory Visit: Payer: Self-pay

## 2021-11-13 MED ORDER — ONDANSETRON HCL 4 MG PO TABS: 4.0000 mg | ORAL_TABLET | Freq: Three times a day (TID) | ORAL | 0 refills | Status: DC | PRN

## 2021-11-13 NOTE — Telephone Encounter (Signed)
If having diarrhea, would not recommend abx - unless we know for sure what treating.  Needs to be evaluated if diarrhea persist.  Stay hydrated, I can send in something for nausea if having nausea.  Just let me know.  ?

## 2021-11-13 NOTE — Telephone Encounter (Signed)
Attempted to call pt -no ans, vm full 

## 2021-11-13 NOTE — Telephone Encounter (Signed)
Patient stated she would like something sent in for nausea. Patient will continue to take the imodium. ?

## 2021-11-13 NOTE — Telephone Encounter (Signed)
Zofran sent - ok per dr Nicki Reaper ?

## 2021-11-13 NOTE — Telephone Encounter (Signed)
Patient ate a organtic [place] masala past the due date 06/19/22.  ?She is having diarrhea , nausea no vomiting. I offered the patient an appointment. She does not want to come to the office. The patient is asking for an antibiotic.  ?

## 2021-11-15 ENCOUNTER — Ambulatory Visit: Payer: PPO

## 2021-11-15 ENCOUNTER — Telehealth: Payer: Self-pay | Admitting: Internal Medicine

## 2021-11-15 DIAGNOSIS — M6281 Muscle weakness (generalized): Secondary | ICD-10-CM | POA: Diagnosis not present

## 2021-11-15 DIAGNOSIS — G8929 Other chronic pain: Secondary | ICD-10-CM

## 2021-11-15 NOTE — Telephone Encounter (Signed)
Pt called in requesting for a copy of her Full MRI Report... Pt stated that she have an appt tomorrow with another provider... Pt requesting for reports today... Pt requesting callback...  ?

## 2021-11-15 NOTE — Telephone Encounter (Signed)
Will pick up MRI reports. ?

## 2021-11-15 NOTE — Therapy (Signed)
?OUTPATIENT PHYSICAL THERAPY TREATMENT NOTE ? ? ? ?Patient Name: Lynn Mann ?MRN: [id number] ?DOB:1950/08/19, 71 y.o., female ?Today's Date: 11/15/2021 ? ?PCP: Einar Pheasant, MD ?REFERRING PROVIDER: Diamond Nickel, DO ? ? PT End of Session - 11/15/21 1422   ? ? Visit Number 42   ? Number of Visits 53   ? Date for PT Re-Evaluation 11/29/21   ? Authorization Type 2   ? Authorization Time Period 10   ? PT Start Time 4097   pt arrived late  ? PT Stop Time 1456   ? PT Time Calculation (min) 34 min   ? Activity Tolerance Patient tolerated treatment well   ? Behavior During Therapy [facility] for tasks assessed/performed   ? ?  ?  ? ?  ? ? ? ? ? ? ? ?Past Medical History:  ?Diagnosis Date  ? Arthritis   ? C. difficile diarrhea   ? age 20s-40s   ? Chicken pox   ? Cholecystitis 11/2011  ? Did not require sgy - Dr. Staci Acosta - [facility]  (cholelithiasis)  ? H/O Clostridium difficile infection   ? IBS (irritable bowel syndrome)   ? MRSA exposure 2005  ? Spider bite  ? MVP (mitral valve prolapse)   ? Stable - Dr. Ubaldo Glassing  ? Rheumatic fever   ? ?Past Surgical History:  ?Procedure Laterality Date  ? CATARACT EXTRACTION  [id number]  ? MUSCLE BIOPSY    ? ?Patient Active Problem List  ? Diagnosis Date Noted  ? Cough 09/07/2021  ? Right shoulder pain 07/15/2021  ? Hematoma 02/11/2021  ? Ankle swelling 11/26/2020  ? Low back pain 11/22/2020  ? Hip pain, right 11/22/2020  ? Light headedness 06/24/2020  ? Hearing loss 06/24/2020  ? Change in vision 06/24/2020  ? Stress 05/17/2020  ? Chest pain 05/16/2020  ? Welcome to Medicare preventive visit 06/20/2019  ? Viral syndrome 01/30/2019  ? Itching 07/21/2017  ? Asthma 07/16/2016  ? Urinary incontinence 07/16/2016  ? SOB (shortness of breath) 04/01/2016  ? Bilateral shoulder pain 02/24/2016  ? Fatigue 01/28/2016  ? Neck fullness 01/28/2016  ? Routine general medical examination at a health care facility 07/25/2015  ? Health care maintenance 10/09/2014  ? Neck pain 11/26/2013  ? Hypercholesterolemia  11/26/2013  ? History of colonic polyps 05/11/2013  ? Hyperbilirubinemia 01/18/2013  ? GERD (gastroesophageal reflux disease) 12/03/2012  ? Cholelithiasis 11/07/2012  ? IBS (irritable bowel syndrome) 11/07/2012  ? History of rheumatic fever 08/28/2012  ? MVP (mitral valve prolapse) 08/28/2012  ? ? ?REFERRING DIAG: Acute pain of R shoulder, Fall, anterior subluxation of sternoclavicular joint, arthritis of R sternoclavicular joint ? ?THERAPY DIAG:  ?Muscle weakness (generalized) ? ?PERTINENT HISTORY: R shoulder did not start really hurting until about a month after her fall. Just started with a little pain at the [facility] joint and clavicle. Sometimes she also feels pain along her R pectoralis muscle in the shape of a triangle. Pt fell June 2022. Pt got up to go to the bathroom and tripped over a suitcase. Pt fell against the garment rack on her R side which kind of shielded her R side. Pt also got a hematoma the size of a baked potato at her L leg afterwards. Pt hurt R toes during the fall which got better after 3 days. Went to urgent care for her leg. Hematoma has improved but still has a tiny knot in her L leg. Pt is R hand dominant. Pt was in PT around May to  July 2022 for for her R hip which is better. ? ?PRECAUTIONS: possible fall risk ? ?SUBJECTIVE: R shoulder is doing ok.  ? ? ?PAIN:  ?Are you having pain? 0/10 ? ? ? ? ?Objectives ? ? ?Manual therapy ? ? ?STM to B ( R > L) cervical paraspinals, B ( L > R) upper traps to reduce muscle tension and R shoulder pain.  ? ? ?Therapeutic exercise:  ?    ? ?Seated manually resisted scapular retraction targeting lower trap ? R 10x5 seconds for 3 sets ? L 10x5 seconds for 3 sets ? ? ?Seated R shoulder assisted flexion to end range with PT manual resistance to extension 10x3 ? ?Seated R shoulder assisted abduction to end range with PT manual resistance to adduction 10x3 ? ? ? ? ?  ?Improved exercise technique, movement at target joints, use of target muscles after mod verbal,  visual, tactile cues.  ?              ?Response to treatment ?Pt tolerated session well without aggravation of symptoms.  ? ? ? ?Clinical impression ?Pt arrived late so session was adjusted accordingly. Continued working on scapular and posterior shoulder strength to promote stability with R arm movements. Continued working on STM to decrease cervical paraspinal and upper trap muscle tension to her neck. Pt tolerated session well without aggravation of symptoms. Pt will benefit from continued skilled physical therapy services to continue to decrease pain, improve ROM, strength and function. ? ? ? ? ? ?PATIENT EDUCATION: ?Education details: ther-ex ?Person educated: Patient ?Education method: Explanation, Demonstration, Tactile cues, and Verbal cues ?Education comprehension: verbalized understanding and returned demonstration ? ? ?HOME EXERCISE PROGRAM: ?Access Code: [email or web address] ?URL: [email or web address] ?Date: 10/16/2021 ?Prepared by: Joneen Boers ? ?Exercises ?Seated Scapular Retraction - 1 x daily - 7 x weekly - 3 sets - 10 reps - 5-15 seconds hold ?Sternocleidomastoid Stretch - 2 x daily - 7 x weekly - 1 sets - 5 reps - 10 seconds hold ?Isometric Shoulder Adduction - 1 x daily - 7 x weekly - 2-3 sets - 10 reps - 5 seconds hold ?Seated Shoulder Flexion AAROM with Pulley Behind - 1 x daily - 7 x weekly - 3 sets - 10 reps ?Supine Chin Tuck - 1 x daily - 7 x weekly - 3 sets - 10 reps ?Supine Shoulder Horizontal Abduction and Adduction - 1 x daily - 7 x weekly - 3 sets - 10 reps ?Single Arm Shoulder Extension with Anchored Resistance - 1 x daily - 7 x weekly - 3 sets - 10 reps ?Shoulder Adduction with Anchored Resistance - 1 x daily - 7 x weekly - 3 sets - 10 reps ? ? PT Short Term Goals - 07/18/21 1111   ? ?  ? PT SHORT TERM GOAL #1  ? Title Pt will be independent with her initial HEP to decrease pain, improve strength, function, and ability to reach, push, and pull more comfortably.   ? Baseline Pt has  started her HEP (05/22/2021); Pt performing her HEP. Has some questions but forgot what they are (06/25/2021), No questions with HEP, doing them (07/18/2021)   ? Time 3   ? Period Weeks   ? Status Achieved   ? Target Date 06/14/21   ? ?  ?  ? ?  ? ? ? PT Long Term Goals - 11/08/21 0808   ? ?  ? PT LONG TERM GOAL #1  ? Title Pt will have a decrease in  R shoulder pain to 3/10 or less at worst to promote ability to reach, open and close the door, lift items more comfortably.   ? Baseline 10/10 R shoulder pain at worst for the past 2 months (05/22/2021); 6/10 at worst for the past 7 days (06/25/2021); 9-10/10 at worst for the past 7 days (07/18/2021); (07/24/2021); 08/20/21: Worst pain reported at 8/10 NPS.  8/10 at worst for the past 7 days (sudden and sharp ) 09/10/2021; 6-7/10 at most for the past 7 days. Did better when she took the muscle relaxers. Took muscle relaxers last night. Knocks her out.  (10/04/2021); 4-5/10 at worst for the past 7 days (10/31/2021); 2/10 R shoulder/clavicle pain at most for the past 7 days (11/08/2021)   ? Time 4   ? Period Weeks   ? Status Achieved   ? Target Date 11/29/21   ?  ? PT LONG TERM GOAL #2  ? Title Pt will improve R shoulder flexion AROM to 150 degrees or more without pain to promote ability to reach.   ? Baseline 90 degrees R shoulder flexion AROM with pain (05/22/2021); 123 degrees flexion AROM, 147 degrees after resisted extension exercise (06/25/2021); 128 degrees flexion,, 97 degrees abduction  (07/18/2021); 36 degrees flexion (07/24/2021); 08/20/21: 135 degrees; 127 degrees flexion AROM (09/10/2021); 148 degrees flexion with just a touch of pain at the top (3/92023); 161 degrees flexion AROM (10/31/2021); 155 degrees (prior to treatment (11/08/2021)   ? Time 4   ? Period Weeks   ? Status Achieved   ? Target Date 11/01/21   ?  ? PT LONG TERM GOAL #3  ? Title Pt will improve her R shoulder strength all planes by at least 1/2 MMT grade to promote ability to reach as well as lift  items.   ? Baseline R shoulder flexion 4/5, abduction 4-/5, ER 4+/5, IR 4/5 (05/22/2021); R shoulder flexion 4+/5, abduction 4/5, ER 4+/5, IR 4+/5 (06/25/2021); R shoulder flexion 4/5, abduction 4/5, ER 4/5

## 2021-11-15 NOTE — Telephone Encounter (Signed)
Lm for pt to cb - is she picking up report? Or wants it faxed somewhere.  ?Report printed. ?

## 2021-11-16 DIAGNOSIS — H353131 Nonexudative age-related macular degeneration, bilateral, early dry stage: Secondary | ICD-10-CM | POA: Diagnosis not present

## 2021-11-16 DIAGNOSIS — H5203 Hypermetropia, bilateral: Secondary | ICD-10-CM | POA: Diagnosis not present

## 2021-11-16 DIAGNOSIS — H01009 Unspecified blepharitis unspecified eye, unspecified eyelid: Secondary | ICD-10-CM | POA: Diagnosis not present

## 2021-11-16 DIAGNOSIS — H04129 Dry eye syndrome of unspecified lacrimal gland: Secondary | ICD-10-CM | POA: Diagnosis not present

## 2021-11-16 DIAGNOSIS — H519 Unspecified disorder of binocular movement: Secondary | ICD-10-CM | POA: Diagnosis not present

## 2021-11-16 DIAGNOSIS — H532 Diplopia: Secondary | ICD-10-CM | POA: Diagnosis not present

## 2021-11-20 ENCOUNTER — Ambulatory Visit: Payer: PPO

## 2021-11-20 DIAGNOSIS — G8929 Other chronic pain: Secondary | ICD-10-CM

## 2021-11-20 DIAGNOSIS — M6281 Muscle weakness (generalized): Secondary | ICD-10-CM

## 2021-11-20 NOTE — Therapy (Signed)
?OUTPATIENT PHYSICAL THERAPY TREATMENT NOTE ? ? ? ?Patient Name: Lynn Mann ?MRN: [id number] ?DOB:02-23-1951, 71 y.o., female ?Today's Date: 11/20/2021 ? ?PCP: Einar Pheasant, MD ?REFERRING PROVIDER: Diamond Nickel, DO ? ? PT End of Session - 11/20/21 1419   ? ? Visit Number [id number]   ? Number of Visits 53   ? Date for PT Re-Evaluation 11/29/21   ? Authorization Type 3   ? Authorization Time Period 10   ? PT Start Time 1419   ? PT Stop Time 7412   ? PT Time Calculation (min) 40 min   ? Activity Tolerance Patient tolerated treatment well   ? Behavior During Therapy [facility] for tasks assessed/performed   ? ?  ?  ? ?  ? ? ? ? ? ? ? ? ?Past Medical History:  ?Diagnosis Date  ? Arthritis   ? C. difficile diarrhea   ? age 21s-40s   ? Chicken pox   ? Cholecystitis 11/2011  ? Did not require sgy - Dr. Staci Acosta - [facility]  (cholelithiasis)  ? H/O Clostridium difficile infection   ? IBS (irritable bowel syndrome)   ? MRSA exposure 2005  ? Spider bite  ? MVP (mitral valve prolapse)   ? Stable - Dr. Ubaldo Glassing  ? Rheumatic fever   ? ?Past Surgical History:  ?Procedure Laterality Date  ? CATARACT EXTRACTION  [id number]  ? MUSCLE BIOPSY    ? ?Patient Active Problem List  ? Diagnosis Date Noted  ? Cough 09/07/2021  ? Right shoulder pain 07/15/2021  ? Hematoma 02/11/2021  ? Ankle swelling 11/26/2020  ? Low back pain 11/22/2020  ? Hip pain, right 11/22/2020  ? Light headedness 06/24/2020  ? Hearing loss 06/24/2020  ? Change in vision 06/24/2020  ? Stress 05/17/2020  ? Chest pain 05/16/2020  ? Welcome to Medicare preventive visit 06/20/2019  ? Viral syndrome 01/30/2019  ? Itching 07/21/2017  ? Asthma 07/16/2016  ? Urinary incontinence 07/16/2016  ? SOB (shortness of breath) 04/01/2016  ? Bilateral shoulder pain 02/24/2016  ? Fatigue 01/28/2016  ? Neck fullness 01/28/2016  ? Routine general medical examination at a health care facility 07/25/2015  ? Health care maintenance 10/09/2014  ? Neck pain 11/26/2013  ? Hypercholesterolemia 11/26/2013  ?  History of colonic polyps 05/11/2013  ? Hyperbilirubinemia 01/18/2013  ? GERD (gastroesophageal reflux disease) 12/03/2012  ? Cholelithiasis 11/07/2012  ? IBS (irritable bowel syndrome) 11/07/2012  ? History of rheumatic fever 08/28/2012  ? MVP (mitral valve prolapse) 08/28/2012  ? ? ?REFERRING DIAG: Acute pain of R shoulder, Fall, anterior subluxation of sternoclavicular joint, arthritis of R sternoclavicular joint ? ?THERAPY DIAG:  ?Muscle weakness (generalized) ? ?PERTINENT HISTORY: R shoulder did not start really hurting until about a month after her fall. Just started with a little pain at the [facility] joint and clavicle. Sometimes she also feels pain along her R pectoralis muscle in the shape of a triangle. Pt fell June 2022. Pt got up to go to the bathroom and tripped over a suitcase. Pt fell against the garment rack on her R side which kind of shielded her R side. Pt also got a hematoma the size of a baked potato at her L leg afterwards. Pt hurt R toes during the fall which got better after 3 days. Went to urgent care for her leg. Hematoma has improved but still has a tiny knot in her L leg. Pt is R hand dominant. Pt was in PT around May to July 2022 for  for her R hip which is better. ? ?PRECAUTIONS: possible fall risk ? ?SUBJECTIVE: R shoulder is doing ok. No pain at rest, feels symptoms when she moves it. No pain with shoulder flexion. Feels unsteady since leaving her house.   ? ? ?PAIN:  ?Are you having pain? 0/10 ? ? ? ? ?Objectives ? ? ?Manual therapy  ? ? ?STM to B ( R > L) cervical paraspinals, B ( L > R) upper traps to reduce muscle tension and R shoulder pain.  ?Seated STM R distal scalene area to decrease tension  ? ?Therapeutic exercise:  ?    ? ?Seated manually resisted scapular retraction targeting lower trap ? R 10x5 seconds for 2 sets ? L 10x5 seconds for 2 sets ? ? ?Seated R shoulder assisted flexion to end range with PT manual resistance to extension 10x3 ? ?Seated R shoulder assisted abduction to  end range with PT manual resistance to adduction 10x3 ? ? ?Seated manually resisted R triceps extension isometric in neutral 10x5 seconds for 3 sets ? ?  ?R shoulder ER yellow band 10x3 with scapular retraction ?            ?Improved exercise technique, movement at target joints, use of target muscles after mod verbal, visual, tactile cues.  ?              ?Response to treatment ?Pt tolerated session well without aggravation of symptoms.  ? ? ? ?Clinical impression ?Continued working on scapular and posterior shoulder strength to promote stability with R arm movements. Continued working on STM to decrease cervical paraspinal and upper trap muscle tension to her neck. Pt tolerated session well without aggravation of symptoms. Pt will benefit from continued skilled physical therapy services to continue to decrease pain, improve ROM, strength and function. ? ? ? ? ? ?PATIENT EDUCATION: ?Education details: ther-ex ?Person educated: Patient ?Education method: Explanation, Demonstration, Tactile cues, and Verbal cues ?Education comprehension: verbalized understanding and returned demonstration ? ? ?HOME EXERCISE PROGRAM: ?Access Code: [email or web address] ?URL: [email or web address] ?Date: 10/16/2021 ?Prepared by: Joneen Boers ? ?Exercises ?Seated Scapular Retraction - 1 x daily - 7 x weekly - 3 sets - 10 reps - 5-15 seconds hold ?Sternocleidomastoid Stretch - 2 x daily - 7 x weekly - 1 sets - 5 reps - 10 seconds hold ?Isometric Shoulder Adduction - 1 x daily - 7 x weekly - 2-3 sets - 10 reps - 5 seconds hold ?Seated Shoulder Flexion AAROM with Pulley Behind - 1 x daily - 7 x weekly - 3 sets - 10 reps ?Supine Chin Tuck - 1 x daily - 7 x weekly - 3 sets - 10 reps ?Supine Shoulder Horizontal Abduction and Adduction - 1 x daily - 7 x weekly - 3 sets - 10 reps ?Single Arm Shoulder Extension with Anchored Resistance - 1 x daily - 7 x weekly - 3 sets - 10 reps ?Shoulder Adduction with Anchored Resistance - 1 x daily - 7 x  weekly - 3 sets - 10 reps ? ? PT Short Term Goals - 07/18/21 1111   ? ?  ? PT SHORT TERM GOAL #1  ? Title Pt will be independent with her initial HEP to decrease pain, improve strength, function, and ability to reach, push, and pull more comfortably.   ? Baseline Pt has started her HEP (05/22/2021); Pt performing her HEP. Has some questions but forgot what they are (06/25/2021), No questions with HEP, doing them (07/18/2021)   ? Time 3   ?  Period Weeks   ? Status Achieved   ? Target Date 06/14/21   ? ?  ?  ? ?  ? ? ? PT Long Term Goals - 11/08/21 0808   ? ?  ? PT LONG TERM GOAL #1  ? Title Pt will have a decrease in R shoulder pain to 3/10 or less at worst to promote ability to reach, open and close the door, lift items more comfortably.   ? Baseline 10/10 R shoulder pain at worst for the past 2 months (05/22/2021); 6/10 at worst for the past 7 days (06/25/2021); 9-10/10 at worst for the past 7 days (07/18/2021); (07/24/2021); 08/20/21: Worst pain reported at 8/10 NPS.  8/10 at worst for the past 7 days (sudden and sharp ) 09/10/2021; 6-7/10 at most for the past 7 days. Did better when she took the muscle relaxers. Took muscle relaxers last night. Knocks her out.  (10/04/2021); 4-5/10 at worst for the past 7 days (10/31/2021); 2/10 R shoulder/clavicle pain at most for the past 7 days (11/08/2021)   ? Time 4   ? Period Weeks   ? Status Achieved   ? Target Date 11/29/21   ?  ? PT LONG TERM GOAL #2  ? Title Pt will improve R shoulder flexion AROM to 150 degrees or more without pain to promote ability to reach.   ? Baseline 90 degrees R shoulder flexion AROM with pain (05/22/2021); 123 degrees flexion AROM, 147 degrees after resisted extension exercise (06/25/2021); 128 degrees flexion,, 97 degrees abduction  (07/18/2021); 36 degrees flexion (07/24/2021); 08/20/21: 135 degrees; 127 degrees flexion AROM (09/10/2021); 148 degrees flexion with just a touch of pain at the top (3/92023); 161 degrees flexion AROM (10/31/2021); 155  degrees (prior to treatment (11/08/2021)   ? Time 4   ? Period Weeks   ? Status Achieved   ? Target Date 11/01/21   ?  ? PT LONG TERM GOAL #3  ? Title Pt will improve her R shoulder strength all planes by at [employer]

## 2021-11-22 ENCOUNTER — Ambulatory Visit: Payer: PPO

## 2021-11-22 DIAGNOSIS — M6281 Muscle weakness (generalized): Secondary | ICD-10-CM | POA: Diagnosis not present

## 2021-11-22 DIAGNOSIS — G8929 Other chronic pain: Secondary | ICD-10-CM

## 2021-11-22 NOTE — Therapy (Signed)
?OUTPATIENT PHYSICAL THERAPY TREATMENT NOTE ? ? ? ?Patient Name: Lynn Mann ?MRN: [id number] ?DOB:[birth year], 71 y.o., female ?Today's Date: 11/22/2021 ? ?PCP: Einar Pheasant, MD ?REFERRING PROVIDER: Diamond Nickel, DO ? ? PT End of Session - 11/22/21 6295   ? ? Visit Number 44   ? Number of Visits 53   ? Date for PT Re-Evaluation 11/29/21   ? Authorization Type 4   ? Authorization Time Period 10   ? PT Start Time [phone]   pt arrived late  ? PT Stop Time 3244   ? PT Time Calculation (min) 34 min   ? Activity Tolerance Patient tolerated treatment well   ? Behavior During Therapy [facility] for tasks assessed/performed   ? ?  ?  ? ?  ? ? ? ? ? ? ? ? ? ?Past Medical History:  ?Diagnosis Date  ? Arthritis   ? C. difficile diarrhea   ? age 86s-40s   ? Chicken pox   ? Cholecystitis 11/2011  ? Did not require sgy - Dr. Staci Acosta - [facility]  (cholelithiasis)  ? H/O Clostridium difficile infection   ? IBS (irritable bowel syndrome)   ? MRSA exposure 2005  ? Spider bite  ? MVP (mitral valve prolapse)   ? Stable - Dr. Ubaldo Glassing  ? Rheumatic fever   ? ?Past Surgical History:  ?Procedure Laterality Date  ? CATARACT EXTRACTION  [id number]  ? MUSCLE BIOPSY    ? ?Patient Active Problem List  ? Diagnosis Date Noted  ? Cough 09/07/2021  ? Right shoulder pain 07/15/2021  ? Hematoma 02/11/2021  ? Ankle swelling 11/26/2020  ? Low back pain 11/22/2020  ? Hip pain, right 11/22/2020  ? Light headedness 06/24/2020  ? Hearing loss 06/24/2020  ? Change in vision 06/24/2020  ? Stress 05/17/2020  ? Chest pain 05/16/2020  ? Welcome to Medicare preventive visit 06/20/2019  ? Viral syndrome 01/30/2019  ? Itching 07/21/2017  ? Asthma 07/16/2016  ? Urinary incontinence 07/16/2016  ? SOB (shortness of breath) 04/01/2016  ? Bilateral shoulder pain 02/24/2016  ? Fatigue 01/28/2016  ? Neck fullness 01/28/2016  ? Routine general medical examination at a health care facility 07/25/2015  ? Health care maintenance 10/09/2014  ? Neck pain 11/26/2013  ?  Hypercholesterolemia 11/26/2013  ? History of colonic polyps 05/11/2013  ? Hyperbilirubinemia 01/18/2013  ? GERD (gastroesophageal reflux disease) 12/03/2012  ? Cholelithiasis 11/07/2012  ? IBS (irritable bowel syndrome) 11/07/2012  ? History of rheumatic fever 08/28/2012  ? MVP (mitral valve prolapse) 08/28/2012  ? ? ?REFERRING DIAG: Acute pain of R shoulder, Fall, anterior subluxation of sternoclavicular joint, arthritis of R sternoclavicular joint ? ?THERAPY DIAG:  ?Muscle weakness (generalized) ? ?PERTINENT HISTORY: R shoulder did not start really hurting until about a month after her fall. Just started with a little pain at the [facility] joint and clavicle. Sometimes she also feels pain along her R pectoralis muscle in the shape of a triangle. Pt fell June 2022. Pt got up to go to the bathroom and tripped over a suitcase. Pt fell against the garment rack on her R side which kind of shielded her R side. Pt also got a hematoma the size of a baked potato at her L leg afterwards. Pt hurt R toes during the fall which got better after 3 days. Went to urgent care for her leg. Hematoma has improved but still has a tiny knot in her L leg. Pt is R hand dominant. Pt was in PT around  May to July 2022 for for her R hip which is better. ? ?PRECAUTIONS: possible fall risk ? ?SUBJECTIVE: R shoulder is doing ok. It has its moments but its getting better.  ? ? ? ?PAIN:  ?Are you having pain? 0/10 ? ? ? ? ?Objectives ? ? ?Manual therapy ?STM to B ( R > L) cervical paraspinals, L upper traps to reduce muscle tension and R shoulder pain.  ?Seated STM R distal scalene area to decrease tension  ? ?Therapeutic exercise:  ?    ? ?Seated manually resisted scapular retraction targeting lower trap ? R 10x5 seconds for 2 sets ? L 10x5 seconds for 2 sets ? ? ?Seated R shoulder assisted flexion to end range with PT manual resistance to extension 10x3 ? ?Seated R shoulder assisted abduction to end range with PT manual resistance to adduction 10x3 ?   ?R shoulder ER yellow band 10x3 with scapular retraction ? ?Seated manually resisted R triceps extension isometric in neutral 10x5 seconds for 2 sets ? ?            ?Improved exercise technique, movement at target joints, use of target muscles after mod verbal, visual, tactile cues.  ?              ?Response to treatment ?Pt tolerated session well without aggravation of symptoms.  ? ? ? ?Clinical impression ?Pt arrived late so session was adjusted accordingly. Improving ability to raise her R arm up against gravity more comfortably and with less difficulty observed. Continued working on scapular and posterior shoulder strength to promote stability with R arm movements. Continued working on STM to decrease cervical paraspinal and upper trap muscle tension to her neck. Pt tolerated session well without aggravation of symptoms. Pt will benefit from continued skilled physical therapy services to continue to decrease pain, improve ROM, strength and function. ? ? ? ? ? ?PATIENT EDUCATION: ?Education details: ther-ex ?Person educated: Patient ?Education method: Explanation, Demonstration, Tactile cues, and Verbal cues ?Education comprehension: verbalized understanding and returned demonstration ? ? ?HOME EXERCISE PROGRAM: ?Access Code: [email or web address] ?URL: [email or web address] ?Date: 10/16/2021 ?Prepared by: Joneen Boers ? ?Exercises ?Seated Scapular Retraction - 1 x daily - 7 x weekly - 3 sets - 10 reps - 5-15 seconds hold ?Sternocleidomastoid Stretch - 2 x daily - 7 x weekly - 1 sets - 5 reps - 10 seconds hold ?Isometric Shoulder Adduction - 1 x daily - 7 x weekly - 2-3 sets - 10 reps - 5 seconds hold ?Seated Shoulder Flexion AAROM with Pulley Behind - 1 x daily - 7 x weekly - 3 sets - 10 reps ?Supine Chin Tuck - 1 x daily - 7 x weekly - 3 sets - 10 reps ?Supine Shoulder Horizontal Abduction and Adduction - 1 x daily - 7 x weekly - 3 sets - 10 reps ?Single Arm Shoulder Extension with Anchored Resistance - 1 x  daily - 7 x weekly - 3 sets - 10 reps ?Shoulder Adduction with Anchored Resistance - 1 x daily - 7 x weekly - 3 sets - 10 reps ? ? ? ? ? PT Short Term Goals - 07/18/21 1111   ? ?  ? PT SHORT TERM GOAL #1  ? Title Pt will be independent with her initial HEP to decrease pain, improve strength, function, and ability to reach, push, and pull more comfortably.   ? Baseline Pt has started her HEP (05/22/2021); Pt performing her HEP. Has some questions but forgot what they are (06/25/2021), No  questions with HEP, doing them (07/18/2021)   ? Time 3   ? Period Weeks   ? Status Achieved   ? Target Date 06/14/21   ? ?  ?  ? ?  ? ? ? PT Long Term Goals - 11/08/21 0808   ? ?  ? PT LONG TERM GOAL #1  ? Title Pt will have a decrease in R shoulder pain to 3/10 or less at worst to promote ability to reach, open and close the door, lift items more comfortably.   ? Baseline 10/10 R shoulder pain at worst for the past 2 months (05/22/2021); 6/10 at worst for the past 7 days (06/25/2021); 9-10/10 at worst for the past 7 days (07/18/2021); (07/24/2021); 08/20/21: Worst pain reported at 8/10 NPS.  8/10 at worst for the past 7 days (sudden and sharp ) 09/10/2021; 6-7/10 at most for the past 7 days. Did better when she took the muscle relaxers. Took muscle relaxers last night. Knocks her out.  (10/04/2021); 4-5/10 at worst for the past 7 days (10/31/2021); 2/10 R shoulder/clavicle pain at most for the past 7 days (11/08/2021)   ? Time 4   ? Period Weeks   ? Status Achieved   ? Target Date 11/29/21   ?  ? PT LONG TERM GOAL #2  ? Title Pt will improve R shoulder flexion AROM to 150 degrees or more without pain to promote ability to reach.   ? Baseline 90 degrees R shoulder flexion AROM with pain (05/22/2021); 123 degrees flexion AROM, 147 degrees after resisted extension exercise (06/25/2021); 128 degrees flexion,, 97 degrees abduction  (07/18/2021); 36 degrees flexion (07/24/2021); 08/20/21: 135 degrees; 127 degrees flexion AROM (09/10/2021); 148  degrees flexion with just a touch of pain at the top (3/92023); 161 degrees flexion AROM (10/31/2021); 155 degrees (prior to treatment (11/08/2021)   ? Time 4   ? Period Weeks   ? Status Achieved   ? Target Date 11/01/21   ?

## 2021-11-23 ENCOUNTER — Other Ambulatory Visit: Payer: Self-pay | Admitting: Internal Medicine

## 2021-11-23 DIAGNOSIS — H538 Other visual disturbances: Secondary | ICD-10-CM

## 2021-11-23 NOTE — Telephone Encounter (Signed)
Please let her know that I did speak with him and we discussed the neurology referral.  Someone should contact her with an appt date and time.   ?

## 2021-11-23 NOTE — Progress Notes (Signed)
F/u neurology referral.  ?

## 2021-11-23 NOTE — Telephone Encounter (Signed)
Pt called and I let her know that referral was placed to see Dr. Manuella Ghazi concerning her vision. I asked that she please let us know if she does hear in regards to making an appointment in the next week.  ?

## 2021-11-23 NOTE — Telephone Encounter (Signed)
Pt called in requesting callback... Pt would like to get an update on information that Dr. Ellin Mayhew spoke with Dr. Nicki Reaper about.... Pt requesting callback ?

## 2021-11-27 ENCOUNTER — Ambulatory Visit: Payer: PPO | Attending: Sports Medicine

## 2021-11-27 DIAGNOSIS — M25511 Pain in right shoulder: Secondary | ICD-10-CM | POA: Insufficient documentation

## 2021-11-27 DIAGNOSIS — M6281 Muscle weakness (generalized): Secondary | ICD-10-CM | POA: Diagnosis not present

## 2021-11-27 DIAGNOSIS — G8929 Other chronic pain: Secondary | ICD-10-CM | POA: Diagnosis not present

## 2021-11-27 NOTE — Therapy (Signed)
?OUTPATIENT PHYSICAL THERAPY TREATMENT NOTE ? ? ? ?Patient Name: Lynn Mann ?MRN: [id number] ?DOB:September 11, 1950, 71 y.o., female ?Today's Date: 11/27/2021 ? ?PCP: Einar Pheasant, MD ?REFERRING PROVIDER: Diamond Nickel, DO ? ? PT End of Session - 11/27/21 1022   ? ? Visit Number [id number]   ? Number of Visits 53   ? Date for PT Re-Evaluation 11/29/21   ? Authorization Type 5   ? Authorization Time Period 10   ? PT Start Time 1022   pt arrived late  ? PT Stop Time 1107   ? PT Time Calculation (min) 45 min   ? Activity Tolerance Patient tolerated treatment well   ? Behavior During Therapy [facility] for tasks assessed/performed   ? ?  ?  ? ?  ? ? ? ? ? ? ? ? ? ? ?Past Medical History:  ?Diagnosis Date  ? Arthritis   ? C. difficile diarrhea   ? age 67s-40s   ? Chicken pox   ? Cholecystitis 11/2011  ? Did not require sgy - Dr. Staci Acosta - [facility]  (cholelithiasis)  ? H/O Clostridium difficile infection   ? IBS (irritable bowel syndrome)   ? MRSA exposure 2005  ? Spider bite  ? MVP (mitral valve prolapse)   ? Stable - Dr. Ubaldo Glassing  ? Rheumatic fever   ? ?Past Surgical History:  ?Procedure Laterality Date  ? CATARACT EXTRACTION  [id number]  ? MUSCLE BIOPSY    ? ?Patient Active Problem List  ? Diagnosis Date Noted  ? Cough 09/07/2021  ? Right shoulder pain 07/15/2021  ? Hematoma 02/11/2021  ? Ankle swelling 11/26/2020  ? Low back pain 11/22/2020  ? Hip pain, right 11/22/2020  ? Light headedness 06/24/2020  ? Hearing loss 06/24/2020  ? Change in vision 06/24/2020  ? Stress 05/17/2020  ? Chest pain 05/16/2020  ? Welcome to Medicare preventive visit 06/20/2019  ? Viral syndrome 01/30/2019  ? Itching 07/21/2017  ? Asthma 07/16/2016  ? Urinary incontinence 07/16/2016  ? SOB (shortness of breath) 04/01/2016  ? Bilateral shoulder pain 02/24/2016  ? Fatigue 01/28/2016  ? Neck fullness 01/28/2016  ? Routine general medical examination at a health care facility 07/25/2015  ? Health care maintenance 10/09/2014  ? Neck pain 11/26/2013  ?  Hypercholesterolemia 11/26/2013  ? History of colonic polyps 05/11/2013  ? Hyperbilirubinemia 01/18/2013  ? GERD (gastroesophageal reflux disease) 12/03/2012  ? Cholelithiasis 11/07/2012  ? IBS (irritable bowel syndrome) 11/07/2012  ? History of rheumatic fever 08/28/2012  ? MVP (mitral valve prolapse) 08/28/2012  ? ? ?REFERRING DIAG: Acute pain of R shoulder, Fall, anterior subluxation of sternoclavicular joint, arthritis of R sternoclavicular joint ? ?THERAPY DIAG:  ?Muscle weakness (generalized) ? ?PERTINENT HISTORY: R shoulder did not start really hurting until about a month after her fall. Just started with a little pain at the [facility] joint and clavicle. Sometimes she also feels pain along her R pectoralis muscle in the shape of a triangle. Pt fell June 2022. Pt got up to go to the bathroom and tripped over a suitcase. Pt fell against the garment rack on her R side which kind of shielded her R side. Pt also got a hematoma the size of a baked potato at her L leg afterwards. Pt hurt R toes during the fall which got better after 3 days. Went to urgent care for her leg. Hematoma has improved but still has a tiny knot in her L leg. Pt is R hand dominant. Pt was in PT  around May to July 2022 for for her R hip which is better. ? ?PRECAUTIONS: possible fall risk ? ?SUBJECTIVE: R shoulder is kinda doing ok. No pain currently. 3/10 at most for the past 7 days.  ? ? ? ?PAIN:  ?Are you having pain? 0/10 ? ? ? ? ?Objectives ? ? ?Manual therapy  ?STM to B ( R > L) cervical paraspinals, L upper traps to reduce muscle tension and R shoulder pain.  ?Seated STM R distal scalene area to decrease tension  ? ?Therapeutic exercise:  ?    ?Seated manually resisted scapular retraction targeting lower trap ? R 10x5 seconds for 2 sets ? L 10x5 seconds for 2 sets ? ? ?Seated R shoulder assisted flexion to end range with PT manual resistance to extension 10x3 ? ? ?Seated R shoulder assisted abduction to end range with PT manual resistance to  adduction 10x3 ?  ?R shoulder ER yellow band 10x3 with scapular retraction ? ?Seated  R triceps extension isometric in neutral on table 10x5 seconds for 2 sets ? ? ?Reviewed plan of care: discharge next visit secondary to very good progress with PT towards goals.  ? ? ? ?            ?Improved exercise technique, movement at target joints, use of target muscles after mod verbal, visual, tactile cues.  ?              ?Response to treatment ?Pt tolerated session well without aggravation of symptoms.  ? ? ? ?Clinical impression ?Pt making very good progress with decreased R shoulder pain based on subjective reports. Pt able to achieve almost full range R shoulder flexion AAROM at times. Continued working on scapular and posterior shoulder strength to promote stability with R arm movements. Continued working on STM to decrease cervical paraspinal and upper trap muscle tension to her neck. Pt tolerated session well without aggravation of symptoms. Pt will benefit from continued skilled physical therapy services to continue to decrease pain, improve ROM, strength and function. ? ? ? ? ? ?PATIENT EDUCATION: ?Education details: ther-ex ?Person educated: Patient ?Education method: Explanation, Demonstration, Tactile cues, and Verbal cues ?Education comprehension: verbalized understanding and returned demonstration ? ? ?HOME EXERCISE PROGRAM: ?Access Code: [email or web address] ?URL: [email or web address] ?Date: 10/16/2021 ?Prepared by: Joneen Boers ? ?Exercises ?Seated Scapular Retraction - 1 x daily - 7 x weekly - 3 sets - 10 reps - 5-15 seconds hold ?Sternocleidomastoid Stretch - 2 x daily - 7 x weekly - 1 sets - 5 reps - 10 seconds hold ?Isometric Shoulder Adduction - 1 x daily - 7 x weekly - 2-3 sets - 10 reps - 5 seconds hold ?Seated Shoulder Flexion AAROM with Pulley Behind - 1 x daily - 7 x weekly - 3 sets - 10 reps ?Supine Chin Tuck - 1 x daily - 7 x weekly - 3 sets - 10 reps ?Supine Shoulder Horizontal Abduction and  Adduction - 1 x daily - 7 x weekly - 3 sets - 10 reps ?Single Arm Shoulder Extension with Anchored Resistance - 1 x daily - 7 x weekly - 3 sets - 10 reps ?Shoulder Adduction with Anchored Resistance - 1 x daily - 7 x weekly - 3 sets - 10 reps ? ?- Isometric Tricep Extension   - 1 x daily - 7 x weekly - 3 sets - 10 reps - 5 seconds hold ? ? ? PT Short Term Goals - 07/18/21 1111   ? ?  ? PT SHORT  TERM GOAL #1  ? Title Pt will be independent with her initial HEP to decrease pain, improve strength, function, and ability to reach, push, and pull more comfortably.   ? Baseline Pt has started her HEP (05/22/2021); Pt performing her HEP. Has some questions but forgot what they are (06/25/2021), No questions with HEP, doing them (07/18/2021)   ? Time 3   ? Period Weeks   ? Status Achieved   ? Target Date 06/14/21   ? ?  ?  ? ?  ? ? ? PT Long Term Goals - 11/08/21 0808   ? ?  ? PT LONG TERM GOAL #1  ? Title Pt will have a decrease in R shoulder pain to 3/10 or less at worst to promote ability to reach, open and close the door, lift items more comfortably.   ? Baseline 10/10 R shoulder pain at worst for the past 2 months (05/22/2021); 6/10 at worst for the past 7 days (06/25/2021); 9-10/10 at worst for the past 7 days (07/18/2021); (07/24/2021); 08/20/21: Worst pain reported at 8/10 NPS.  8/10 at worst for the past 7 days (sudden and sharp ) 09/10/2021; 6-7/10 at most for the past 7 days. Did better when she took the muscle relaxers. Took muscle relaxers last night. Knocks her out.  (10/04/2021); 4-5/10 at worst for the past 7 days (10/31/2021); 2/10 R shoulder/clavicle pain at most for the past 7 days (11/08/2021)   ? Time 4   ? Period Weeks   ? Status Achieved   ? Target Date 11/29/21   ?  ? PT LONG TERM GOAL #2  ? Title Pt will improve R shoulder flexion AROM to 150 degrees or more without pain to promote ability to reach.   ? Baseline 90 degrees R shoulder flexion AROM with pain (05/22/2021); 123 degrees flexion AROM, 147 degrees  after resisted extension exercise (06/25/2021); 128 degrees flexion,, 97 degrees abduction  (07/18/2021); 36 degrees flexion (07/24/2021); 08/20/21: 135 degrees; 127 degrees flexion AROM (09/10/2021); 148 degrees flex

## 2021-11-28 ENCOUNTER — Telehealth: Payer: Self-pay | Admitting: Internal Medicine

## 2021-11-28 NOTE — Telephone Encounter (Signed)
Patient received a letter for jury duty. She was wondering if Dr Nicki Reaper would write a letter for her to be dismissed from jury duty do to health issues. ?

## 2021-11-29 ENCOUNTER — Ambulatory Visit: Payer: PPO

## 2021-11-29 DIAGNOSIS — M6281 Muscle weakness (generalized): Secondary | ICD-10-CM | POA: Diagnosis not present

## 2021-11-29 DIAGNOSIS — G8929 Other chronic pain: Secondary | ICD-10-CM

## 2021-11-29 NOTE — Therapy (Signed)
?OUTPATIENT PHYSICAL THERAPY TREATMENT NOTE ?And ?Discharge Summary ? ? ? ?Patient Name: Lynn Mann ?MRN: [id number] ?DOB:March 12, 1951, 71 y.o., female ?Today's Date: 11/29/2021 ? ?PCP: Einar Pheasant, MD ?REFERRING PROVIDER: Diamond Nickel, DO ? ? PT End of Session - 11/29/21 1021   ? ? Visit Number [phone]   ? Number of Visits 53   ? Date for PT Re-Evaluation 11/29/21   ? Authorization Type 6   ? Authorization Time Period 10   ? PT Start Time 1021   ? PT Stop Time 1059   ? PT Time Calculation (min) 38 min   ? Activity Tolerance Patient tolerated treatment well   ? Behavior During Therapy [facility] for tasks assessed/performed   ? ?  ?  ? ?  ? ? ? ? ? ? ? ? ? ? ? ?Past Medical History:  ?Diagnosis Date  ? Arthritis   ? C. difficile diarrhea   ? age 40s-40s   ? Chicken pox   ? Cholecystitis 11/2011  ? Did not require sgy - Dr. Staci Acosta - [facility]  (cholelithiasis)  ? H/O Clostridium difficile infection   ? IBS (irritable bowel syndrome)   ? MRSA exposure 2005  ? Spider bite  ? MVP (mitral valve prolapse)   ? Stable - Dr. Ubaldo Glassing  ? Rheumatic fever   ? ?Past Surgical History:  ?Procedure Laterality Date  ? CATARACT EXTRACTION  [id number]  ? MUSCLE BIOPSY    ? ?Patient Active Problem List  ? Diagnosis Date Noted  ? Cough 09/07/2021  ? Right shoulder pain 07/15/2021  ? Hematoma 02/11/2021  ? Ankle swelling 11/26/2020  ? Low back pain 11/22/2020  ? Hip pain, right 11/22/2020  ? Light headedness 06/24/2020  ? Hearing loss 06/24/2020  ? Change in vision 06/24/2020  ? Stress 05/17/2020  ? Chest pain 05/16/2020  ? Welcome to Medicare preventive visit 06/20/2019  ? Viral syndrome 01/30/2019  ? Itching 07/21/2017  ? Asthma 07/16/2016  ? Urinary incontinence 07/16/2016  ? SOB (shortness of breath) 04/01/2016  ? Bilateral shoulder pain 02/24/2016  ? Fatigue 01/28/2016  ? Neck fullness 01/28/2016  ? Routine general medical examination at a health care facility 07/25/2015  ? Health care maintenance 10/09/2014  ? Neck pain 11/26/2013  ?  Hypercholesterolemia 11/26/2013  ? History of colonic polyps 05/11/2013  ? Hyperbilirubinemia 01/18/2013  ? GERD (gastroesophageal reflux disease) 12/03/2012  ? Cholelithiasis 11/07/2012  ? IBS (irritable bowel syndrome) 11/07/2012  ? History of rheumatic fever 08/28/2012  ? MVP (mitral valve prolapse) 08/28/2012  ? ? ?REFERRING DIAG: Acute pain of R shoulder, Fall, anterior subluxation of sternoclavicular joint, arthritis of R sternoclavicular joint ? ?THERAPY DIAG:  ?Muscle weakness (generalized) ? ?PERTINENT HISTORY: R shoulder did not start really hurting until about a month after her fall. Just started with a little pain at the [facility] joint and clavicle. Sometimes she also feels pain along her R pectoralis muscle in the shape of a triangle. Pt fell June 2022. Pt got up to go to the bathroom and tripped over a suitcase. Pt fell against the garment rack on her R side which kind of shielded her R side. Pt also got a hematoma the size of a baked potato at her L leg afterwards. Pt hurt R toes during the fall which got better after 3 days. Went to urgent care for her leg. Hematoma has improved but still has a tiny knot in her L leg. Pt is R hand dominant. Pt was in PT  around May to July 2022 for for her R hip which is better. ? ?PRECAUTIONS: possible fall risk ? ?SUBJECTIVE: R shoulder is doing ok. Not as good as it was last time. Still better than it was 2-3 weeks ago. Can undo stuff after a night of sleeping.  ? ? ? ?PAIN:  ?Are you having pain? 0/10. Symptoms when moving shoulder.  ? ? ? ? ?Objectives ? ? ?Manual therapy  ?STM to B ( R > L) cervical paraspinals, L upper traps to reduce muscle tension and R shoulder pain.  ?Seated STM R distal scalene area to decrease tension  ? ?Therapeutic exercise:  ?    ?R shoulder flexion AROM 162 degrees ? ?Manually resisted R shoulder IR 2x ? ?Reviewed progress/current status with PT towards goals  ? ?Seated manually resisted scapular retraction targeting lower trap ? R 10x5  seconds for 2 sets ? L 10x5 seconds for 2 sets ? ?Seated R shoulder assisted flexion to end range with PT manual resistance to extension 10x3 ? ?Seated R shoulder assisted abduction to end range with PT manual resistance to adduction 10x3 ?  ? ?            ?Improved exercise technique, movement at target joints, use of target muscles after mod verbal, visual, tactile cues.  ?              ?Response to treatment ?Pt tolerated session well without aggravation of symptoms.  ? ? ? ?Clinical impression ?Pt demonstrates significant decrease in R shoulder pain, improved strength, AROM and function since initial evaluation. Pt has achieved all goals and demonstrates consistency with her HEP. Skilled physical therapy services discharged with pt continuing with her exercises at home.  ? ? ? ? ?PATIENT EDUCATION: ?Education details: ther-ex ?Person educated: Patient ?Education method: Explanation, Demonstration, Tactile cues, and Verbal cues ?Education comprehension: verbalized understanding and returned demonstration ? ? ?HOME EXERCISE PROGRAM: ?Access Code: [email or web address] ?URL: [email or web address] ?Date: 10/16/2021 ?Prepared by: Joneen Boers ? ?Exercises ?Seated Scapular Retraction - 1 x daily - 7 x weekly - 3 sets - 10 reps - 5-15 seconds hold ?Sternocleidomastoid Stretch - 2 x daily - 7 x weekly - 1 sets - 5 reps - 10 seconds hold ?Isometric Shoulder Adduction - 1 x daily - 7 x weekly - 2-3 sets - 10 reps - 5 seconds hold ?Seated Shoulder Flexion AAROM with Pulley Behind - 1 x daily - 7 x weekly - 3 sets - 10 reps ?Supine Chin Tuck - 1 x daily - 7 x weekly - 3 sets - 10 reps ?Supine Shoulder Horizontal Abduction and Adduction - 1 x daily - 7 x weekly - 3 sets - 10 reps ?Single Arm Shoulder Extension with Anchored Resistance - 1 x daily - 7 x weekly - 3 sets - 10 reps ?Shoulder Adduction with Anchored Resistance - 1 x daily - 7 x weekly - 3 sets - 10 reps ? ?- Isometric Tricep Extension   - 1 x daily - 7 x weekly  - 3 sets - 10 reps - 5 seconds hold ? ? ? PT Short Term Goals - 07/18/21 1111   ? ?  ? PT SHORT TERM GOAL #1  ? Title Pt will be independent with her initial HEP to decrease pain, improve strength, function, and ability to reach, push, and pull more comfortably.   ? Baseline Pt has started her HEP (05/22/2021); Pt performing her HEP. Has some questions but forgot what they are (06/25/2021), No  questions with HEP, doing them (07/18/2021)   ? Time 3   ? Period Weeks   ? Status Achieved   ? Target Date 06/14/21   ? ?  ?  ? ?  ? ? ? PT Long Term Goals - 11/29/21 1026   ? ?  ? PT LONG TERM GOAL #1  ? Title Pt will have a decrease in R shoulder pain to 3/10 or less at worst to promote ability to reach, open and close the door, lift items more comfortably.   ? Baseline 10/10 R shoulder pain at worst for the past 2 months (05/22/2021); 6/10 at worst for the past 7 days (06/25/2021); 9-10/10 at worst for the past 7 days (07/18/2021); (07/24/2021); 08/20/21: Worst pain reported at 8/10 NPS.  8/10 at worst for the past 7 days (sudden and sharp ) 09/10/2021; 6-7/10 at most for the past 7 days. Did better when she took the muscle relaxers. Took muscle relaxers last night. Knocks her out.  (10/04/2021); 4-5/10 at worst for the past 7 days (10/31/2021); 2/10 R shoulder/clavicle pain at most for the past 7 days (11/08/2021); 2-3/10 R shoulder pain at worst for the past 7 days (11/29/2021)   ? Time 4   ? Period Weeks   ? Status Achieved   ? Target Date 11/29/21   ?  ? PT LONG TERM GOAL #2  ? Title Pt will improve R shoulder flexion AROM to 150 degrees or more without pain to promote ability to reach.   ? Baseline 90 degrees R shoulder flexion AROM with pain (05/22/2021); 123 degrees flexion AROM, 147 degrees after resisted extension exercise (06/25/2021); 128 degrees flexion,, 97 degrees abduction  (07/18/2021); 36 degrees flexion (07/24/2021); 08/20/21: 135 degrees; 127 degrees flexion AROM (09/10/2021); 148 degrees flexion with just a touch  of pain at the top (3/92023); 161 degrees flexion AROM (10/31/2021); 155 degrees (prior to treatment (11/08/2021). 162 degrees flexion AROM (11/29/2021)   ? Time 4   ? Period Weeks   ? Status Achieved   ? Target Date

## 2021-11-30 ENCOUNTER — Telehealth: Payer: Self-pay | Admitting: Internal Medicine

## 2021-11-30 NOTE — Telephone Encounter (Addendum)
Pt called stating she has not heard anything from the neurology referral ?

## 2021-11-30 NOTE — Telephone Encounter (Signed)
Letter printed, signed and placed in box.   ?

## 2021-12-05 ENCOUNTER — Telehealth: Payer: Self-pay

## 2021-12-05 NOTE — Telephone Encounter (Signed)
Patient called to say we referred her to Dr. Guinevere Scarlet office and she had not heard from them to schedule an appointment.  Patient said she called them today and they said they have not received the referral, but she did go ahead and schedule an appointment with them for 03/20/2022. ?

## 2021-12-06 ENCOUNTER — Encounter: Payer: Self-pay | Admitting: Internal Medicine

## 2021-12-06 ENCOUNTER — Ambulatory Visit (INDEPENDENT_AMBULATORY_CARE_PROVIDER_SITE_OTHER)
Admission: RE | Admit: 2021-12-06 | Discharge: 2021-12-06 | Disposition: A | Discharge status: Home / Self Care | Payer: PPO | Source: Ambulatory Visit | Attending: Internal Medicine | Admitting: Internal Medicine

## 2021-12-06 ENCOUNTER — Ambulatory Visit (INDEPENDENT_AMBULATORY_CARE_PROVIDER_SITE_OTHER): Payer: PPO | Admitting: Internal Medicine

## 2021-12-06 VITALS — BP 124/80 | HR 91 | Temp 97.8°F | Resp 14 | Ht 67.0 in | Wt 179.0 lb

## 2021-12-06 DIAGNOSIS — H532 Diplopia: Secondary | ICD-10-CM

## 2021-12-06 DIAGNOSIS — M25473 Effusion, unspecified ankle: Secondary | ICD-10-CM

## 2021-12-06 DIAGNOSIS — R32 Unspecified urinary incontinence: Secondary | ICD-10-CM | POA: Diagnosis not present

## 2021-12-06 DIAGNOSIS — J4520 Mild intermittent asthma, uncomplicated: Secondary | ICD-10-CM | POA: Diagnosis not present

## 2021-12-06 DIAGNOSIS — E7800 Pure hypercholesterolemia, unspecified: Secondary | ICD-10-CM | POA: Diagnosis not present

## 2021-12-06 DIAGNOSIS — M7989 Other specified soft tissue disorders: Secondary | ICD-10-CM | POA: Diagnosis not present

## 2021-12-06 DIAGNOSIS — M25571 Pain in right ankle and joints of right foot: Secondary | ICD-10-CM | POA: Diagnosis not present

## 2021-12-06 DIAGNOSIS — Z86010 Personal history of colonic polyps: Secondary | ICD-10-CM

## 2021-12-06 DIAGNOSIS — E875 Hyperkalemia: Secondary | ICD-10-CM | POA: Diagnosis not present

## 2021-12-06 IMAGING — DX DG ANKLE COMPLETE 3+V*R*
4 series · 4 of 4 positions shown · non-contrast
Comparison: None Available.

CLINICAL DATA: Right ankle pain, no known injury, initial encounter

EXAM:
RIGHT ANKLE - COMPLETE 3+ VIEW

[ankle ap]
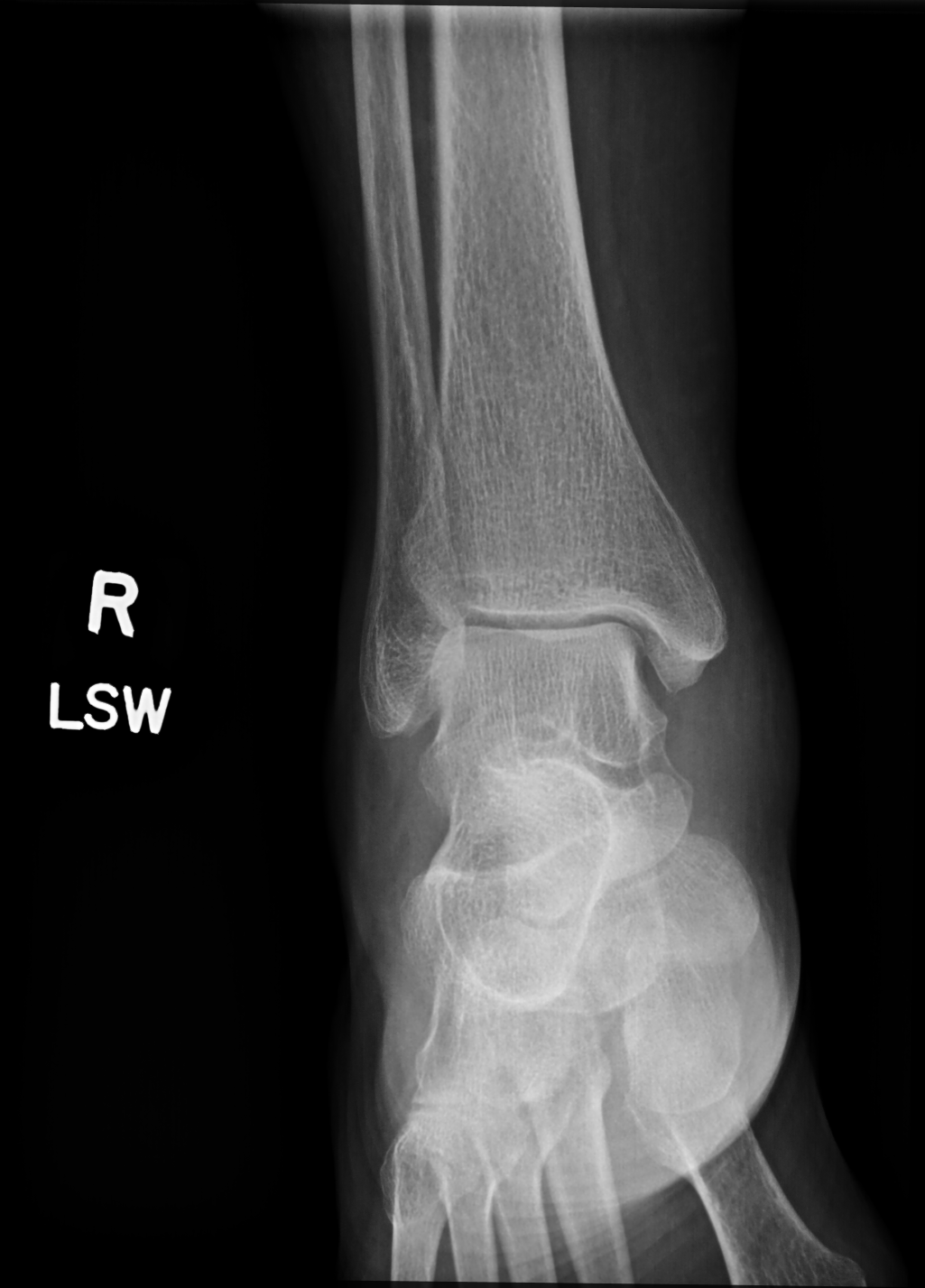

[ankle obl (oblique) (1 of 2)]
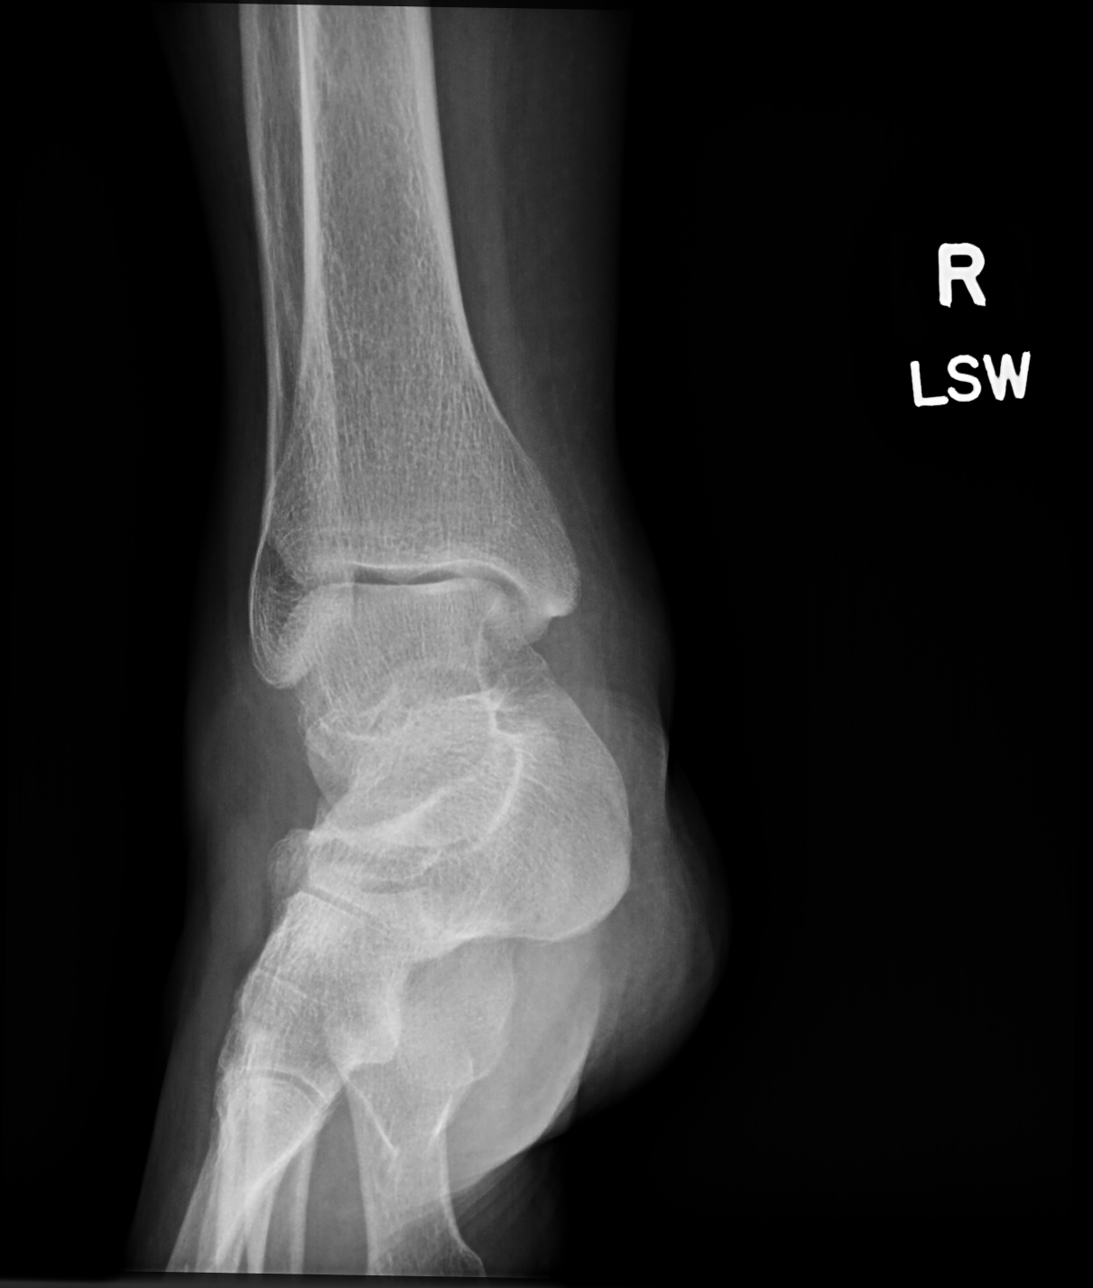

[ankle obl (oblique) (2 of 2)]
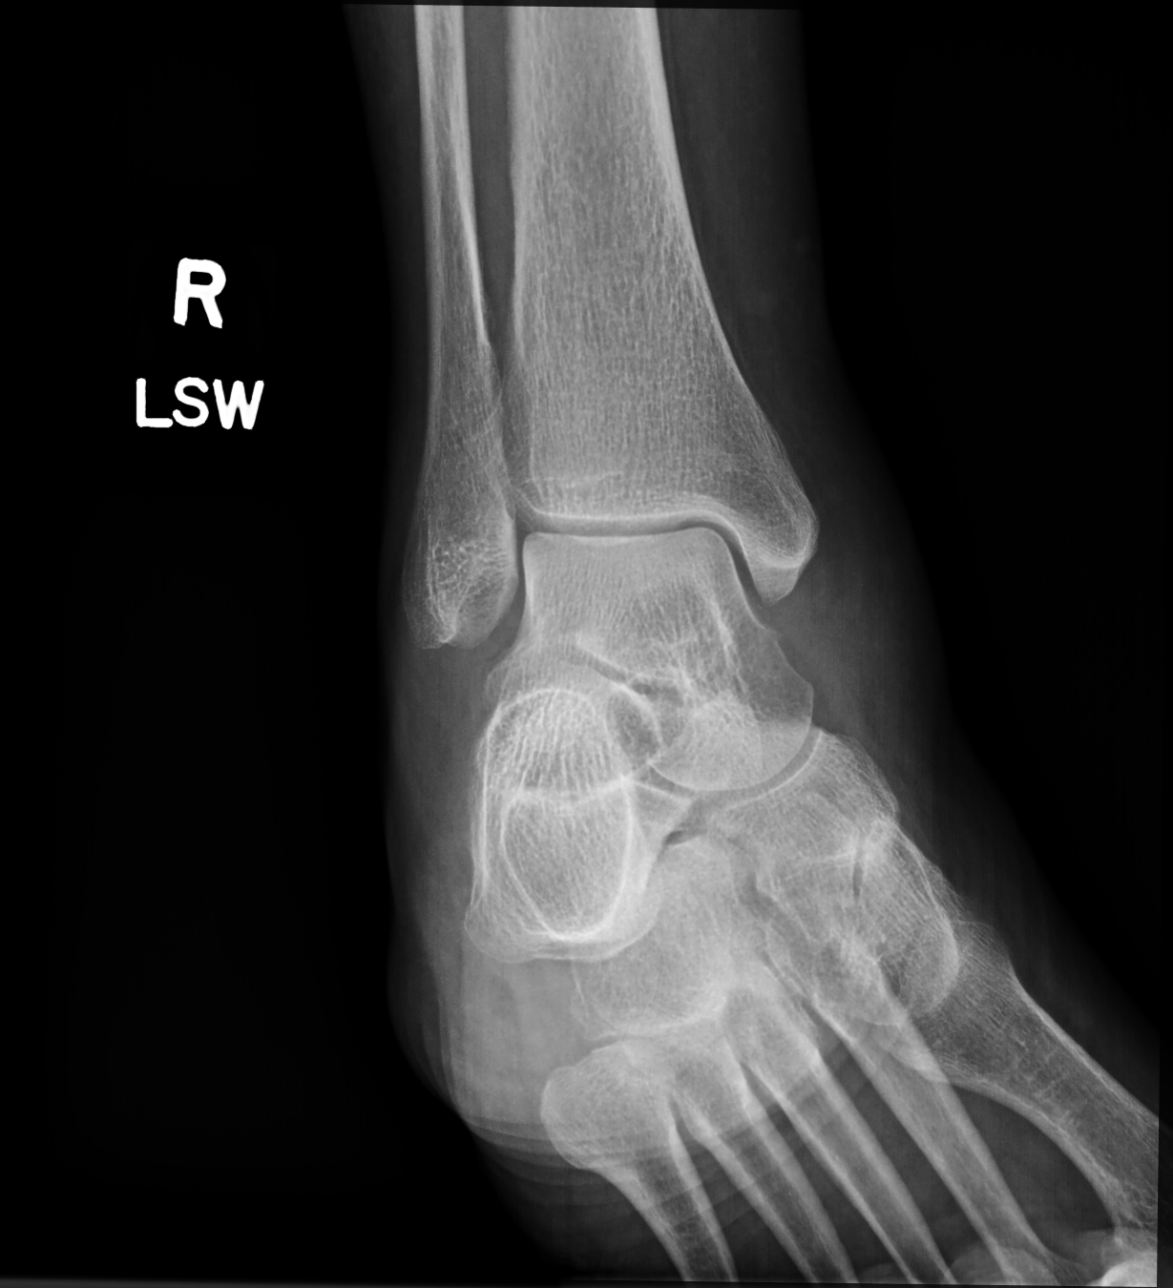

[ankle lat]
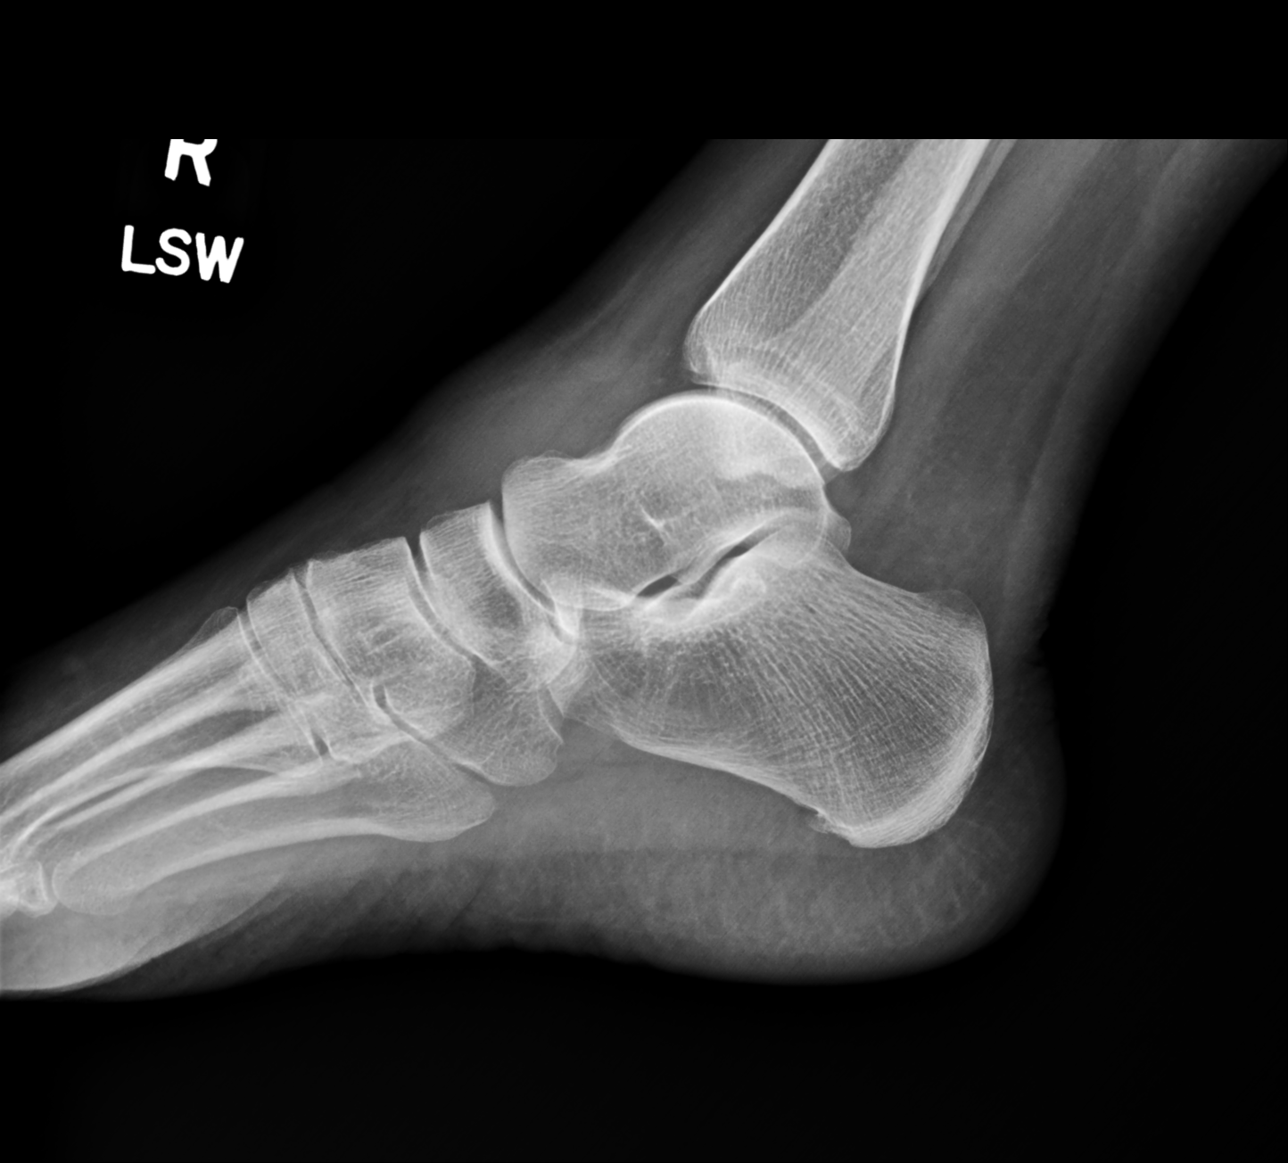

[4 of 4 positions shown; findings below may reference images not displayed]

FINDINGS: Generalized soft tissue swelling is noted about the ankle worst
medially. No acute fracture or dislocation is noted. No foreign body
is noted.
IMPRESSION: Soft tissue swelling about the ankle consistent with physical exam.
No acute fracture is seen.

## 2021-12-06 NOTE — Progress Notes (Signed)
Patient ID: Lynn Mann, female   DOB: Apr 27, 1951, 71 y.o.   MRN: [id number] ? ? ?Subjective:  ? ? Patient ID: Lynn Mann, female    DOB: 11/29/1950, 71 y.o.   MRN: [id number] ? ?This visit occurred during the SARS-CoV-2 public health emergency.  Safety protocols were in place, including screening questions prior to the visit, additional usage of staff PPE, and extensive cleaning of exam room while observing appropriate contact time as indicated for disinfecting solutions.  ? ?Patient here for a scheduled follow up.  ? ?Chief Complaint  ?Patient presents with  ? Follow-up  ?  Follow up  ? Hyperlipidemia  ? .  ? ?HPI ?Recently has been evaluated by her eye doctor. Found to have binocular diplopia.  MRI brain - no acute abnormality.  Mild chronic small vessel ischemic changes.  Have placed referral to neurology for further evaluation.  Will see double vision - one on top of the other - at a slant.  Denies any chest pain.  Feels breathing is stable.  Did report pulse ox at home when walking 89%.  Checked here - 97-98% with 5 minute walk.  No increased cough or congestion.  No acid reflux.  No abdominal pain or bowel change reported.  Incontinence is worse.  Wants to be referred back for pelvic floor therapy.  Was going prior to covid.  Also reports some ankle pain.  No known injury.  ? ? ?Past Medical History:  ?Diagnosis Date  ? Arthritis   ? C. difficile diarrhea   ? age 84s-40s   ? Chicken pox   ? Cholecystitis 11/2011  ? Did not require sgy - Dr. Staci Acosta - [facility]  (cholelithiasis)  ? H/O Clostridium difficile infection   ? IBS (irritable bowel syndrome)   ? MRSA exposure 2005  ? Spider bite  ? MVP (mitral valve prolapse)   ? Stable - Dr. Ubaldo Glassing  ? Rheumatic fever   ? ?Past Surgical History:  ?Procedure Laterality Date  ? CATARACT EXTRACTION  [id number]  ? MUSCLE BIOPSY    ? ?Family History  ?Problem Relation Age of Onset  ? Arthritis Mother   ? Stroke Mother   ? Diabetes Mother   ? Hypertension Mother   ?  Arthritis Father   ? Stroke Father   ? Diabetes Paternal Grandmother   ? Cancer Paternal Uncle   ?     colon  ? Heart disease Other   ?     maternal and paternal side  ? Breast cancer Paternal Aunt   ? Breast cancer Cousin   ? Breast cancer Cousin   ?     female cousin  ? Breast cancer Cousin   ? ?Social History  ? ?Socioeconomic History  ? Marital status: Widowed  ?  Spouse name: Not on file  ? Number of children: Not on file  ? Years of education: 97  ? Highest education level: Not on file  ?Occupational History  ? Occupation: Retired  ?Tobacco Use  ? Smoking status: Never  ? Smokeless tobacco: Never  ?Vaping Use  ? Vaping Use: Never used  ?Substance and Sexual Activity  ? Alcohol use: Not Currently  ?  Comment: Rarely  ? Drug use: No  ? Sexual activity: Yes  ?  Partners: Male  ?  Birth control/protection: Post-menopausal  ?Other Topics Concern  ? Not on file  ?Social History Narrative  ? Widowed   ? 2 kids son and daughter  ? ?Social  Determinants of Health  ? ?Financial Resource Strain: Low Risk   ? Difficulty of Paying Living Expenses: Not hard at all  ?Food Insecurity: No Food Insecurity  ? Worried About Charity fundraiser in the Last Year: Never true  ? Ran Out of Food in the Last Year: Never true  ?Transportation Needs: No Transportation Needs  ? Lack of Transportation (Medical): No  ? Lack of Transportation (Non-Medical): No  ?Physical Activity: Insufficiently Active  ? Days of Exercise per Week: 2 days  ? Minutes of Exercise per Session: 50 min  ?Stress: No Stress Concern Present  ? Feeling of Stress : Not at all  ?Social Connections: Unknown  ? Frequency of Communication with Friends and Family: More than three times a week  ? Frequency of Social Gatherings with Friends and Family: More than three times a week  ? Attends Religious Services: Not on file  ? Active Member of Clubs or Organizations: Not on file  ? Attends Archivist Meetings: Not on file  ? Marital Status: Widowed  ? ? ? ?Review of  Systems  ?Constitutional:  Negative for appetite change and unexpected weight change.  ?HENT:  Negative for congestion and sinus pressure.   ?Respiratory:  Negative for cough and chest tightness.   ?     Breathing stable.   ?Cardiovascular:  Negative for chest pain, palpitations and leg swelling.  ?Gastrointestinal:  Negative for abdominal pain, diarrhea, nausea and vomiting.  ?Genitourinary:  Negative for difficulty urinating and dysuria.  ?Musculoskeletal:  Negative for joint swelling and myalgias.  ?Skin:  Negative for color change and rash.  ?Neurological:  Negative for dizziness, light-headedness and headaches.  ?Psychiatric/Behavioral:  Negative for agitation and dysphoric mood.   ? ?   ?Objective:  ?  ? ?BP 124/80 (BP Location: Left Arm, Patient Position: Sitting, Cuff Size: Large)   Pulse 91   Temp 97.8 ?F (36.6 ?C) (Temporal)   Resp 14   Ht '5\' 7"'$  (1.702 m)   Wt 179 lb (81.2 kg)   SpO2 97%   BMI 28.04 kg/m?  ?Wt Readings from Last 3 Encounters:  ?12/06/21 179 lb (81.2 kg)  ?10/25/21 184 lb 12.8 oz (83.8 kg)  ?09/26/21 180 lb (81.6 kg)  ? ? ?Physical Exam ? ? ?Outpatient Encounter Medications as of 12/06/2021  ?Medication Sig  ? aspirin 325 MG tablet Take by mouth.  ? b complex vitamins capsule Take 1 capsule by mouth daily.  ? CALCIUM PO Take by mouth.  ? Cholecalciferol (VITAMIN D3 PO) Take by mouth.  ? cyclobenzaprine (FLEXERIL) 5 MG tablet Take 1 tablet (5 mg total) by mouth 3 (three) times daily as needed.  ? fluticasone (FLONASE) 50 MCG/ACT nasal spray Place 2 sprays into both nostrils daily. prn  ? meclizine (ANTIVERT) 25 MG tablet Take 0.5-1 tablets (12.5-25 mg total) by mouth 2 (two) times daily as needed for dizziness.  ? Multiple Vitamins-Minerals (PRESERVISION AREDS 2 PO) Take by mouth.  ? rosuvastatin (CRESTOR) 5 MG tablet Take 5 mg by mouth 2 (two) times a week.  ? [DISCONTINUED] albuterol (VENTOLIN HFA) 108 (90 Base) MCG/ACT inhaler Inhale 2 puffs into the lungs every 6 (six) hours as  needed for wheezing or shortness of breath. (Patient not taking: Reported on 12/06/2021)  ? [DISCONTINUED] ondansetron (ZOFRAN) 4 MG tablet Take 1 tablet (4 mg total) by mouth every 8 (eight) hours as needed for nausea or vomiting. (Patient not taking: Reported on 12/06/2021)  ? ?No facility-administered encounter medications  on file as of 12/06/2021.  ?  ? ?Lab Results  ?Component Value Date  ? WBC 5.2 09/07/2021  ? HGB 13.8 09/07/2021  ? HCT 40.7 09/07/2021  ? PLT 304 09/07/2021  ? GLUCOSE 82 12/06/2021  ? CHOL 213 (H) 12/06/2021  ? TRIG 190.0 (H) 12/06/2021  ? HDL 39.10 12/06/2021  ? LDLDIRECT 110.0 02/11/2017  ? LDLCALC 136 (H) 12/06/2021  ? ALT 15 12/06/2021  ? AST 18 12/06/2021  ? NA 140 12/06/2021  ? K 5.0 12/06/2021  ? CL 103 12/06/2021  ? CREATININE 0.77 12/06/2021  ? BUN 12 12/06/2021  ? CO2 28 12/06/2021  ? TSH 1.96 06/06/2021  ? HGBA1C 5.4 07/15/2017  ? ? ?MR Brain Wo Contrast ? ?Result Date: 11/12/2021 ?CLINICAL DATA:  Provided history: Dizziness, persistent/recurrent, cardiac or vascular cause suspected. A written. Benign paroxysmal positional vertigo due to bilateral vestibular disorder. Dysfunction of both eustachian tubes. Rule out stroke. EXAM: MRI HEAD WITHOUT CONTRAST TECHNIQUE: Multiplanar, multiecho pulse sequences of the brain and surrounding structures were obtained without intravenous contrast. COMPARISON:  Head CT 12/26/2015.  Brain MRI 09/14/2020. FINDINGS: Brain: Cerebral volume is normal. Mild multifocal T2 FLAIR hyperintense signal abnormality within the cerebral white matter and pons, nonspecific but compatible with chronic small vessel ischemic disease. As before, there are multiple small foci of SWI signal loss within the bilateral basal ganglia, likely reflecting mineralization. Redemonstrated chronic parenchymal microhemorrhage within the inferomedial right parietal lobe. There is no acute infarct. No evidence of an intracranial mass. No chronic intracranial blood products. No  extra-axial fluid collection. No midline shift. Vascular: Maintained flow voids within the proximal large arterial vessels. Skull and upper cervical spine: No focal suspicious marrow lesion. Sinuses/Orbits: Visualized orbits sho

## 2021-12-07 LAB — LIPID PANEL
Cholesterol: 213 mg/dL — ABNORMAL HIGH (ref 0–200)
HDL: 39.1 mg/dL (ref >39.00)
LDL Cholesterol: 136 mg/dL — ABNORMAL HIGH (ref 0–99)
NonHDL: 174.02
Total CHOL/HDL Ratio: 5
Triglycerides: 190 mg/dL — ABNORMAL HIGH (ref 0.0–149.0)
VLDL: 38 mg/dL (ref 0.0–40.0)

## 2021-12-07 LAB — HEPATIC FUNCTION PANEL
ALT: 15 U/L (ref 0–35)
AST: 18 U/L (ref 0–37)
Albumin: 4.4 g/dL (ref 3.5–5.2)
Alkaline Phosphatase: 59 U/L (ref 39–117)
Bilirubin, Direct: 0.2 mg/dL (ref 0.0–0.3)
Total Bilirubin: 1 mg/dL (ref 0.2–1.2)
Total Protein: 6.8 g/dL (ref 6.0–8.3)

## 2021-12-07 LAB — BASIC METABOLIC PANEL
BUN: 12 mg/dL (ref 6–23)
CO2: 28 mEq/L (ref 19–32)
Calcium: 9.6 mg/dL (ref 8.4–10.5)
Chloride: 103 mEq/L (ref 96–112)
Creatinine, Ser: 0.77 mg/dL (ref 0.40–1.20)
GFR: 78.81 mL/min (ref >60.00)
Glucose, Bld: 82 mg/dL (ref 70–99)
Potassium: 5 mEq/L (ref 3.5–5.1)
Sodium: 140 mEq/L (ref 135–145)

## 2021-12-07 NOTE — Telephone Encounter (Signed)
The referral is in.  Can you help with this?  Also, needs an appt asap.  Thanks  ?

## 2021-12-07 NOTE — Telephone Encounter (Signed)
FYI

## 2021-12-10 NOTE — Assessment & Plan Note (Signed)
Seeing her optometrist.  Recent MRI no acute abnormality.  Have placed order for referral to neurology for further evaluation.  Will f/u with appt.  Need asap.  ?

## 2021-12-10 NOTE — Assessment & Plan Note (Signed)
Feels is worsening.  Request referral back for pelvic floor exercises.   ?

## 2021-12-10 NOTE — Assessment & Plan Note (Signed)
Pain - ankle. Some swelling. Check xray.  Further w/up pending results.  ?

## 2021-12-10 NOTE — Assessment & Plan Note (Signed)
Colonoscopy 08/2017 - tubular adenoma.  Recommended f/u colonoscopy in 5 years.  

## 2021-12-10 NOTE — Assessment & Plan Note (Signed)
The 10-year ASCVD risk score (Arnett DK, et al., 2019) is: 8.9% ?  Values used to calculate the score: ?    Age: 71 years ?    Sex: Female ?    Is Non-Hispanic African American: No ?    Diabetic: No ?    Tobacco smoker: No ?    Systolic Blood Pressure: 675 mmHg ?    Is BP treated: No ?    HDL Cholesterol: 39.1 mg/dL ?    Total Cholesterol: 213 mg/dL  ?Have discussed calculated cholesterol risk.  Have discussed recommendation for medication.  Has wanted to work on diet and exercise.  Follow lipid panel.  ?

## 2021-12-10 NOTE — Assessment & Plan Note (Signed)
Breathing overall appears to be stable.  No sob noted.  5 minute walk - oxygen remained 97-98%.  Follow.  ?

## 2021-12-13 ENCOUNTER — Other Ambulatory Visit: Payer: Self-pay

## 2021-12-13 ENCOUNTER — Telehealth: Payer: Self-pay

## 2021-12-13 NOTE — Telephone Encounter (Signed)
Patient states she missed our call.  I read Lynn Mann's message to patient.

## 2021-12-13 NOTE — Telephone Encounter (Signed)
Was Lynn Mann notified or do we need to call her?  Thanks

## 2021-12-14 ENCOUNTER — Other Ambulatory Visit: Payer: Self-pay

## 2021-12-14 DIAGNOSIS — E7800 Pure hypercholesterolemia, unspecified: Secondary | ICD-10-CM

## 2021-12-14 DIAGNOSIS — M25471 Effusion, right ankle: Secondary | ICD-10-CM

## 2021-12-26 ENCOUNTER — Ambulatory Visit (INDEPENDENT_AMBULATORY_CARE_PROVIDER_SITE_OTHER): Payer: PPO | Admitting: Internal Medicine

## 2021-12-26 ENCOUNTER — Encounter: Payer: Self-pay | Admitting: Internal Medicine

## 2021-12-26 ENCOUNTER — Ambulatory Visit (INDEPENDENT_AMBULATORY_CARE_PROVIDER_SITE_OTHER): Payer: PPO

## 2021-12-26 ENCOUNTER — Ambulatory Visit: Payer: PPO | Admitting: Internal Medicine

## 2021-12-26 DIAGNOSIS — R509 Fever, unspecified: Secondary | ICD-10-CM | POA: Insufficient documentation

## 2021-12-26 DIAGNOSIS — J4520 Mild intermittent asthma, uncomplicated: Secondary | ICD-10-CM | POA: Diagnosis not present

## 2021-12-26 DIAGNOSIS — R1084 Generalized abdominal pain: Secondary | ICD-10-CM | POA: Diagnosis not present

## 2021-12-26 DIAGNOSIS — R109 Unspecified abdominal pain: Secondary | ICD-10-CM | POA: Insufficient documentation

## 2021-12-26 DIAGNOSIS — B349 Viral infection, unspecified: Secondary | ICD-10-CM | POA: Diagnosis not present

## 2021-12-26 IMAGING — DX DG ABDOMEN 1V
1 series · 1 of 1 positions shown · non-contrast
Comparison: Remote CT of the abdomen and pelvis and lumbar spine
imaging.

CLINICAL DATA: 71-year-old female presenting for evaluation of
abdominal pain.

EXAM:
ABDOMEN - 1 VIEW

[abdomen standing ap]
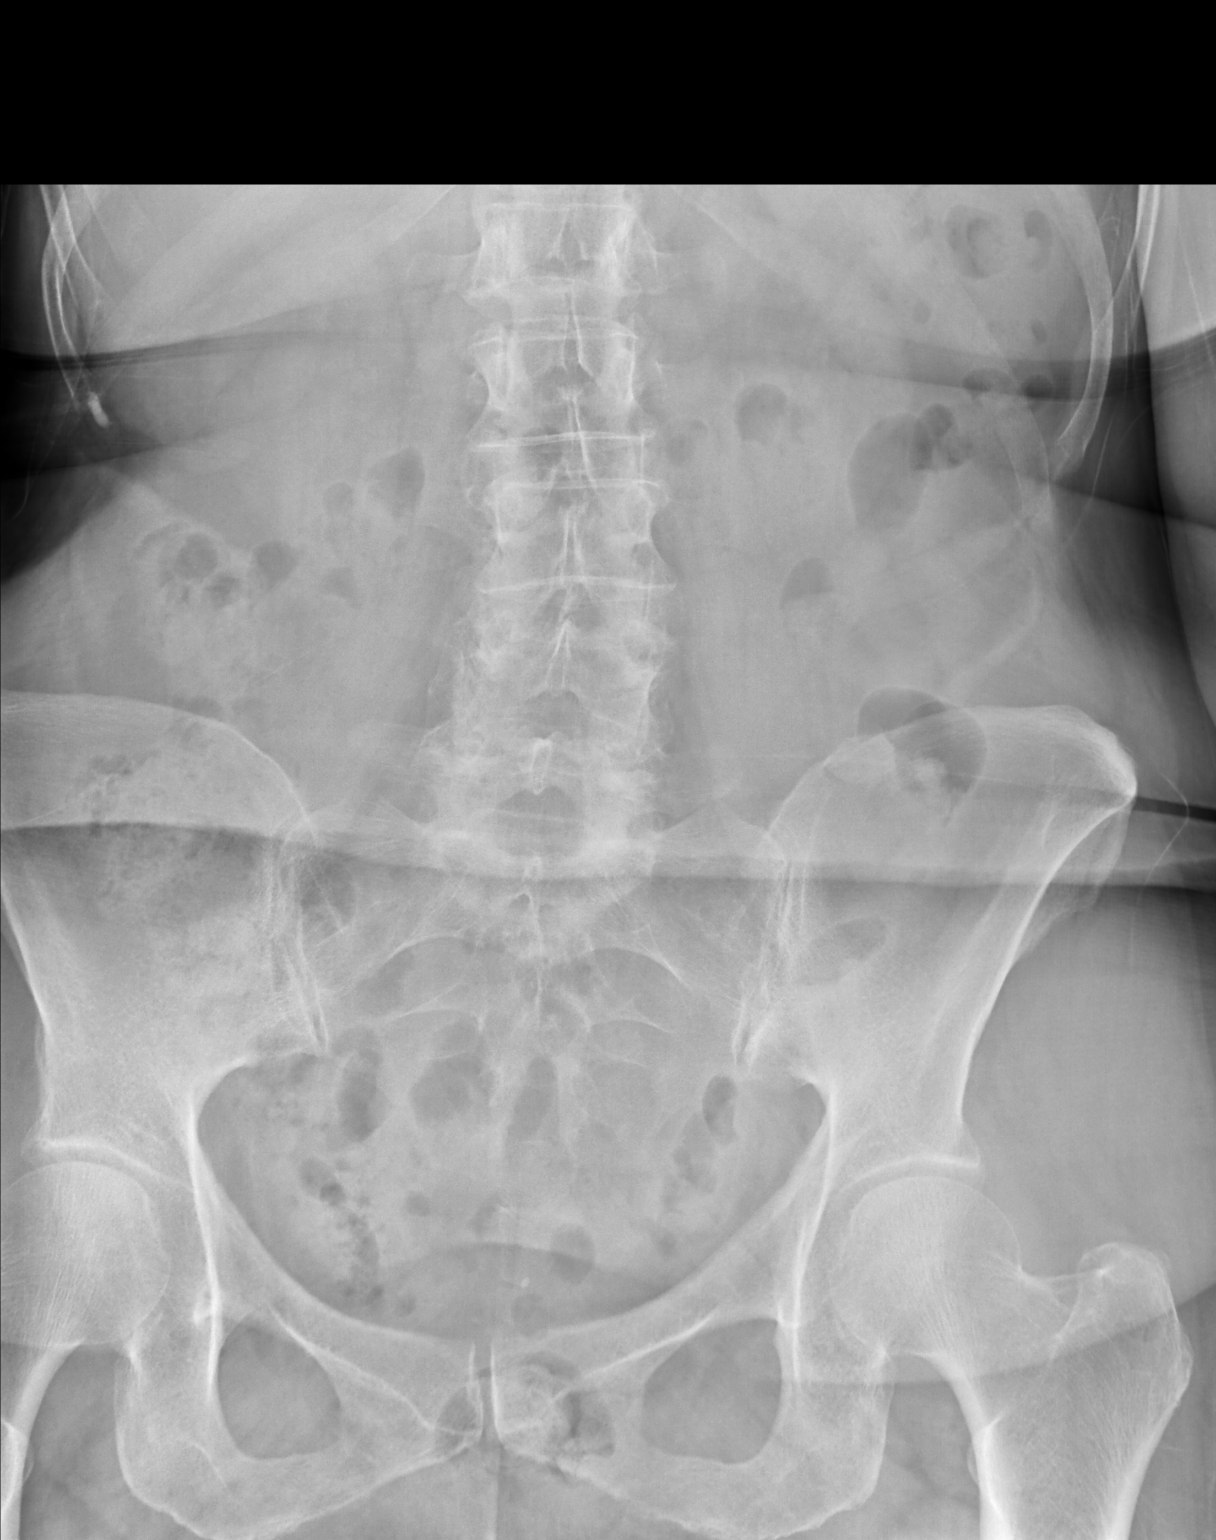

[1 of 1 positions shown; findings below may reference images not displayed]

FINDINGS: Single AP radiograph obtained while standing excludes the LEFT and
RIGHT hemidiaphragm.

Scattered stool and gas throughout the colon. No signs of bowel
obstruction.

No abnormal calcifications.

No gross signs of organomegaly.

Soft tissues are unremarkable.

No acute bone finding to the extent evaluated with spinal
degenerative changes in the lumbar spine.
IMPRESSION: 1. No signs of bowel obstruction. Upright radiograph excludes the
LEFT and RIGHT hemidiaphragm. If there is clinical concern for acute
abdomen with free intra-abdominal air additional imaging to include
the LEFT and RIGHT hemidiaphragm should be considered.

## 2021-12-26 NOTE — Progress Notes (Unsigned)
Patient ID: DAMIRA KEM, female   DOB: May 19, 1951, 71 y.o.   MRN: [id number]   Subjective:    Patient ID: [name], female    DOB: [birth year], 71 y.o.   MRN: [id number]   Patient here for work in appt. Marland Kitchen   HPI Work in with concerns regarding fever, bowel change, abdominal discomfort and sore throat.  States felt tired Friday 12/21/21.  Later developed aching - thighs, arms.  Aching better now.  Some back pain - better now.  Some nausea this am, but no other nausea or vomiting.  Sore throat - better.  Intermittent sharp abdominal pain and increased gas.  Better.  Still some abdominal discomfort, but pain improved.  Bowel movement yesterday - thin/soft.  No diarrhea. Yesterday am - cough.  No chest pain or sob reported.  Previus fever 101 over weekend.  No fever now.  Increased fatigue.     Past Medical History:  Diagnosis Date   Arthritis    C. difficile diarrhea    age 97s-40s    Chicken pox    Cholecystitis 11/2011   Did not require sgy - Dr. Staci Acosta - [facility]  (cholelithiasis)   H/O Clostridium difficile infection    IBS (irritable bowel syndrome)    MRSA exposure 2005   Spider bite   MVP (mitral valve prolapse)    Stable - Dr. Ubaldo Glassing   Rheumatic fever    Past Surgical History:  Procedure Laterality Date   CATARACT EXTRACTION  [id number]   MUSCLE BIOPSY     Family History  Problem Relation Age of Onset   Arthritis Mother    Stroke Mother    Diabetes Mother    Hypertension Mother    Arthritis Father    Stroke Father    Diabetes Paternal Grandmother    Cancer Paternal Uncle        colon   Heart disease Other        maternal and paternal side   Breast cancer Paternal 89    Breast cancer Cousin    Breast cancer Cousin        female cousin   Breast cancer Cousin    Social History   Socioeconomic History   Marital status: Widowed    Spouse name: Not on file   Number of children: Not on file   Years of education: 16   Highest education level: Not on file   Occupational History   Occupation: Retired  Tobacco Use   Smoking status: Never   Smokeless tobacco: Never  Vaping Use   Vaping Use: Never used  Substance and Sexual Activity   Alcohol use: Not Currently    Comment: Rarely   Drug use: No   Sexual activity: Yes    Partners: Male    Birth control/protection: Post-menopausal  Other Topics Concern   Not on file  Social History Narrative   Widowed    2 kids son and daughter   Social Determinants of Health   Financial Resource Strain: Low Risk    Difficulty of Paying Living Expenses: Not hard at all  Food Insecurity: No Food Insecurity   Worried About Charity fundraiser in the Last Year: Never true   Arboriculturist in the Last Year: Never true  Transportation Needs: No Transportation Needs   Lack of Transportation (Medical): No   Lack of Transportation (Non-Medical): No  Physical Activity: Insufficiently Active   Days of Exercise per Week: 2 days  Minutes of Exercise per Session: 50 min  Stress: No Stress Concern Present   Feeling of Stress : Not at all  Social Connections: Unknown   Frequency of Communication with Friends and Family: More than three times a week   Frequency of Social Gatherings with Friends and Family: More than three times a week   Attends Religious Services: Not on file   Active Member of Clubs or Organizations: Not on file   Attends Archivist Meetings: Not on file   Marital Status: Widowed     Review of Systems  Constitutional:  Positive for fatigue and fever.  HENT:  Negative for congestion and sinus pressure.   Respiratory:  Negative for chest tightness and shortness of breath.        Minimal cough yesterday.    Cardiovascular:  Negative for chest pain, palpitations and leg swelling.  Gastrointestinal:  Negative for diarrhea and vomiting.       Some nausea this am. Abdominal discomfort as outlined.   Genitourinary:  Negative for difficulty urinating and dysuria.  Musculoskeletal:   Positive for back pain and myalgias.  Neurological:  Negative for dizziness and headaches.  Psychiatric/Behavioral:  Negative for agitation and dysphoric mood.       Objective:     BP 110/70 (BP Location: Left Arm, Patient Position: Sitting, Cuff Size: Small)   Pulse 70   Temp (!) 97.4 F (36.3 C) (Temporal)   Resp 15   Ht _0  (1.702 m)   Wt 176 lb 1.6 oz (79.9 kg)   SpO2 98%   BMI 27.58 kg/m  Wt Readings from Last 3 Encounters:  12/26/21 176 lb 1.6 oz (79.9 kg)  12/06/21 179 lb (81.2 kg)  10/25/21 184 lb 12.8 oz (83.8 kg)    Physical Exam Vitals reviewed.  Constitutional:      General: She is not in acute distress.    Appearance: Normal appearance.  HENT:     Head: Normocephalic and atraumatic.     Right Ear: External ear normal.     Left Ear: External ear normal.  Eyes:     General: No scleral icterus.       Right eye: No discharge.        Left eye: No discharge.     Conjunctiva/sclera: Conjunctivae normal.  Neck:     Thyroid: No thyromegaly.  Cardiovascular:     Rate and Rhythm: Normal rate and regular rhythm.  Pulmonary:     Effort: No respiratory distress.     Breath sounds: Normal breath sounds. No wheezing.  Abdominal:     General: Bowel sounds are normal.     Palpations: Abdomen is soft.     Comments: Minimal diffuse tenderness.  Some minimal tenderness more focused - suprapubic area.  No rebound or guarding.    Musculoskeletal:        General: No swelling or tenderness.     Cervical back: Neck supple. No tenderness.  Lymphadenopathy:     Cervical: No cervical adenopathy.  Skin:    Findings: No erythema or rash.  Neurological:     Mental Status: She is alert.  Psychiatric:        Mood and Affect: Mood normal.        Behavior: Behavior normal.     Outpatient Encounter Medications as of 12/26/2021  Medication Sig   aspirin 325 MG tablet Take by mouth.   b complex vitamins capsule Take 1 capsule by mouth daily.   CALCIUM PO  Take by mouth.    Cholecalciferol (VITAMIN D3 PO) Take by mouth.   cyclobenzaprine (FLEXERIL) 5 MG tablet Take 1 tablet (5 mg total) by mouth 3 (three) times daily as needed.   fluticasone (FLONASE) 50 MCG/ACT nasal spray Place 2 sprays into both nostrils daily. prn   meclizine (ANTIVERT) 25 MG tablet Take 0.5-1 tablets (12.5-25 mg total) by mouth 2 (two) times daily as needed for dizziness.   Multiple Vitamins-Minerals (PRESERVISION AREDS 2 PO) Take by mouth.   rosuvastatin (CRESTOR) 5 MG tablet Take 5 mg by mouth 2 (two) times a week.   No facility-administered encounter medications on file as of 12/26/2021.     Lab Results  Component Value Date   WBC 5.2 09/07/2021   HGB 13.8 09/07/2021   HCT 40.7 09/07/2021   PLT 304 09/07/2021   GLUCOSE 82 12/06/2021   CHOL 213 (H) 12/06/2021   TRIG 190.0 (H) 12/06/2021   HDL 39.10 12/06/2021   LDLDIRECT 110.0 02/11/2017   LDLCALC 136 (H) 12/06/2021   ALT 15 12/06/2021   AST 18 12/06/2021   NA 140 12/06/2021   K 5.0 12/06/2021   CL 103 12/06/2021   CREATININE 0.77 12/06/2021   BUN 12 12/06/2021   CO2 28 12/06/2021   TSH 1.96 06/06/2021   HGBA1C 5.4 07/15/2017    DG Ankle Complete Right  Result Date: 12/09/2021 CLINICAL DATA:  Right ankle pain, no known injury, initial encounter EXAM: RIGHT ANKLE - COMPLETE 3+ VIEW COMPARISON:  None Available. FINDINGS: Generalized soft tissue swelling is noted about the ankle worst medially. No acute fracture or dislocation is noted. No foreign body is noted. IMPRESSION: Soft tissue swelling about the ankle consistent with physical exam. No acute fracture is seen. Electronically Signed   By: Inez Catalina M.D.   On: 12/09/2021 01:45       Assessment & Plan:   Problem List Items Addressed This Visit     Abdominal pain    Abdominal pain as outlined.  Better.  Check cbc, met b, liver panel and urine to confirm no infection.  Question if viral syndrome.  Appears to be improving.  Will check KUB - with bowel change and  abdominal discomfort.  Follow.  Bland foods.  Stay hydrated.  Any change or worsening symptoms, she is to be evaluated.        Relevant Orders   Hepatic function panel   Urinalysis, Routine w reflex microscopic   Basic metabolic panel   Urine Culture   DG Abd 1 View (Completed)   Asthma    Breathing overall appears to be stable.  No sob noted.  Lungs clear.         Fever    Fever this past weekend. No fever now.  Question viral syndrome.  W/up as outlined. covid swab.  Follow        Relevant Orders   Novel Coronavirus, NAA ([facility])   CBC with Differential/Platelet   Viral syndrome    Symptoms improved as outlined.  Check cbc, met b, liver panel and urine.  Also check KUB.  Bland foods.  Stay hydrated.  Rest.  covid swab.  Follow.           Einar Pheasant, MD

## 2021-12-27 ENCOUNTER — Telehealth: Payer: Self-pay

## 2021-12-27 ENCOUNTER — Encounter: Payer: Self-pay | Admitting: Internal Medicine

## 2021-12-27 DIAGNOSIS — I618 Other nontraumatic intracerebral hemorrhage: Secondary | ICD-10-CM | POA: Diagnosis not present

## 2021-12-27 DIAGNOSIS — E559 Vitamin D deficiency, unspecified: Secondary | ICD-10-CM | POA: Diagnosis not present

## 2021-12-27 DIAGNOSIS — H532 Diplopia: Secondary | ICD-10-CM | POA: Diagnosis not present

## 2021-12-27 DIAGNOSIS — H8110 Benign paroxysmal vertigo, unspecified ear: Secondary | ICD-10-CM | POA: Diagnosis not present

## 2021-12-27 LAB — CBC WITH DIFFERENTIAL/PLATELET
Basophils Absolute: 0.1 10*3/uL (ref 0.0–0.1)
Basophils Relative: 1 % (ref 0.0–3.0)
Eosinophils Absolute: 0 10*3/uL (ref 0.0–0.7)
Eosinophils Relative: 0.4 % (ref 0.0–5.0)
HCT: 40.8 % (ref 36.0–46.0)
Hemoglobin: 13.3 g/dL (ref 12.0–15.0)
Lymphocytes Relative: 25.4 % (ref 12.0–46.0)
Lymphs Abs: 1.5 10*3/uL (ref 0.7–4.0)
MCHC: 32.7 g/dL (ref 30.0–36.0)
MCV: 88.9 fl (ref 78.0–100.0)
Monocytes Absolute: 0.4 10*3/uL (ref 0.1–1.0)
Monocytes Relative: 6.7 % (ref 3.0–12.0)
Neutro Abs: 4 10*3/uL (ref 1.4–7.7)
Neutrophils Relative %: 66.5 % (ref 43.0–77.0)
Platelets: 305 10*3/uL (ref 150.0–400.0)
RBC: 4.59 Mil/uL (ref 3.87–5.11)
RDW: 13.3 % (ref 11.5–15.5)
WBC: 6.1 10*3/uL (ref 4.0–10.5)

## 2021-12-27 LAB — URINE CULTURE
MICRO NUMBER:: 13464767
Result:: NO GROWTH
SPECIMEN QUALITY:: ADEQUATE

## 2021-12-27 LAB — HEPATIC FUNCTION PANEL
ALT: 27 U/L (ref 0–35)
AST: 19 U/L (ref 0–37)
Albumin: 4.1 g/dL (ref 3.5–5.2)
Alkaline Phosphatase: 63 U/L (ref 39–117)
Bilirubin, Direct: 0.1 mg/dL (ref 0.0–0.3)
Total Bilirubin: 0.8 mg/dL (ref 0.2–1.2)
Total Protein: 6.7 g/dL (ref 6.0–8.3)

## 2021-12-27 LAB — NOVEL CORONAVIRUS, NAA: SARS-CoV-2, NAA: NOT DETECTED

## 2021-12-27 NOTE — Assessment & Plan Note (Signed)
Abdominal pain as outlined.  Better.  Check cbc, met b, liver panel and urine to confirm no infection.  Question if viral syndrome.  Appears to be improving.  Will check KUB - with bowel change and abdominal discomfort.  Follow.  Bland foods.  Stay hydrated.  Any change or worsening symptoms, she is to be evaluated.

## 2021-12-27 NOTE — Assessment & Plan Note (Signed)
Symptoms improved as outlined.  Check cbc, met b, liver panel and urine.  Also check KUB.  Bland foods.  Stay hydrated.  Rest.  covid swab.  Follow.

## 2021-12-27 NOTE — Telephone Encounter (Signed)
Patient states she was in her neurologist's office, Jennings Books, MD, today.  Patient states they were having her office visit when suddenly the providers left the room and came back with N95 masks on.  Patient states provider stated that Dr. Randell Patient Scott's office called to let them know that she was positive for covid.  Patient states she took a home covid test and it was negative, so she states she thought she was ok to go to the appointment.  Patient states she saw online that her test was negative, so now she would like to know if she is negative or positive.  Please call.

## 2021-12-27 NOTE — Assessment & Plan Note (Signed)
Breathing overall appears to be stable.  No sob noted.  Lungs clear.   

## 2021-12-27 NOTE — Assessment & Plan Note (Addendum)
Fever this past weekend. No fever now.  Question viral syndrome.  W/up as outlined. covid swab.  Follow

## 2021-12-27 NOTE — Telephone Encounter (Signed)
Pt advised as of now, I do not have a result note posted on her, so I do not have any results. Dr Trena Platt office was advised there was a PENDING COVID test on her this AM due to her saying she was going to a visit with him today at her appointment yesterday. Dr Nicki Reaper advised me to let the office know.

## 2021-12-28 ENCOUNTER — Telehealth: Payer: Self-pay

## 2021-12-28 ENCOUNTER — Other Ambulatory Visit: Payer: Self-pay | Admitting: Neurology

## 2021-12-28 ENCOUNTER — Telehealth: Payer: Self-pay | Admitting: *Deleted

## 2021-12-28 DIAGNOSIS — H532 Diplopia: Secondary | ICD-10-CM

## 2021-12-28 LAB — BASIC METABOLIC PANEL
BUN: 12 mg/dL (ref 6–23)
CO2: 28 mEq/L (ref 19–32)
Calcium: 9.9 mg/dL (ref 8.4–10.5)
Chloride: 104 mEq/L (ref 96–112)
Creatinine, Ser: 0.79 mg/dL (ref 0.40–1.20)
GFR: 76.39 mL/min (ref >60.00)
Glucose, Bld: 100 mg/dL — ABNORMAL HIGH (ref 70–99)
Potassium: 5.2 mEq/L — ABNORMAL HIGH (ref 3.5–5.1)
Sodium: 142 mEq/L (ref 135–145)

## 2021-12-28 NOTE — Addendum Note (Signed)
Addended by: Leeanne Rio on: 12/28/2021 04:09 PM   Modules accepted: Orders

## 2021-12-28 NOTE — Telephone Encounter (Signed)
S/w pt - advised of results. Pt concerned - states having a lot of abdominal pressure and gas. Feels like she has to have a bowel movement, "but nothing is there to come out." Doesn't feel like she is constipated. No diarrhea, which she states is her norm. When she drinks/eats, it creates a pressure in her abdomen. Wondering what could be causing this .Marland Kitchen

## 2021-12-28 NOTE — Telephone Encounter (Signed)
Pt returned call and I read the message to her but she still wanted to talk the cma

## 2021-12-28 NOTE — Telephone Encounter (Signed)
Lm for pt to cb.

## 2021-12-28 NOTE — Telephone Encounter (Signed)
FYI: Our lab ([facility]) missed the BMP and the urinalysis on this patient that was collected on 12/26/21.  The are going to run the BMP now but the urine would need to be recollected if still needed.

## 2021-12-28 NOTE — Telephone Encounter (Signed)
She can try bland foods, advancing slowly.  Stay hydrated.  Benefiber daily.  If having increased pain and pressure, then she needs to be reevaluated.

## 2021-12-28 NOTE — Telephone Encounter (Signed)
-----   Message from Einar Pheasant, MD sent at 12/28/2021  3:03 AM EDT ----- Please notify pt that her covid test is negative.  White blood cell count is wnl. Urine culture is negative.  Liver function tests wnl.  Please confirm doing better.  Any persistent symptoms? Confirm if eating.  Any abdominal pain?

## 2021-12-29 ENCOUNTER — Other Ambulatory Visit: Payer: Self-pay | Admitting: Internal Medicine

## 2021-12-29 DIAGNOSIS — E875 Hyperkalemia: Secondary | ICD-10-CM

## 2021-12-29 DIAGNOSIS — R1084 Generalized abdominal pain: Secondary | ICD-10-CM

## 2021-12-29 NOTE — Progress Notes (Signed)
Order placed for f/u labs.  

## 2021-12-29 NOTE — Telephone Encounter (Signed)
Noted.  See phone note regarding f/u.  Discussed with CMA.

## 2022-01-02 ENCOUNTER — Other Ambulatory Visit (INDEPENDENT_AMBULATORY_CARE_PROVIDER_SITE_OTHER): Payer: PPO

## 2022-01-02 DIAGNOSIS — E7800 Pure hypercholesterolemia, unspecified: Secondary | ICD-10-CM

## 2022-01-02 DIAGNOSIS — R1084 Generalized abdominal pain: Secondary | ICD-10-CM | POA: Diagnosis not present

## 2022-01-02 DIAGNOSIS — E875 Hyperkalemia: Secondary | ICD-10-CM

## 2022-01-02 LAB — URINALYSIS, ROUTINE W REFLEX MICROSCOPIC
Bilirubin Urine: NEGATIVE
Hgb urine dipstick: NEGATIVE
Ketones, ur: NEGATIVE
Leukocytes,Ua: NEGATIVE
Nitrite: NEGATIVE
RBC / HPF: NONE SEEN (ref 0–?)
Specific Gravity, Urine: <=1.005 — AB (ref 1.000–1.030)
Total Protein, Urine: NEGATIVE
Urine Glucose: NEGATIVE
Urobilinogen, UA: 0.2 (ref 0.0–1.0)
WBC, UA: NONE SEEN (ref 0–?)
pH: 6 (ref 5.0–8.0)

## 2022-01-02 LAB — POTASSIUM: Potassium: 3.9 mEq/L (ref 3.5–5.1)

## 2022-01-03 LAB — URINE CULTURE
MICRO NUMBER:: 13495171
Result:: NO GROWTH
SPECIMEN QUALITY:: ADEQUATE

## 2022-01-04 ENCOUNTER — Other Ambulatory Visit: Payer: Self-pay

## 2022-01-04 MED ORDER — ROSUVASTATIN CALCIUM 5 MG PO TABS: 5.0000 mg | ORAL_TABLET | ORAL | 2 refills | Status: DC

## 2022-01-05 ENCOUNTER — Ambulatory Visit
Admission: RE | Admit: 2022-01-05 | Discharge: 2022-01-05 | Disposition: A | Discharge status: Home / Self Care | Payer: PPO | Source: Ambulatory Visit | Attending: Neurology | Admitting: Neurology

## 2022-01-05 DIAGNOSIS — H532 Diplopia: Secondary | ICD-10-CM | POA: Insufficient documentation

## 2022-01-05 DIAGNOSIS — I671 Cerebral aneurysm, nonruptured: Secondary | ICD-10-CM | POA: Diagnosis not present

## 2022-01-05 IMAGING — MR MR MRA HEAD W/O CM
1 series · 27 of 48 positions shown · non-contrast
Comparison: Brain MRI [DATE].  Head CT [DATE].

CLINICAL DATA: Provided history: Diplopia. Additional history
provided by scanning technologist: Vertigo.

EXAM:
MRA HEAD WITHOUT CONTRAST
TECHNIQUE: Angiographic images of the Circle of Willis were acquired using MRA
technique without intravenous contrast.

[Series 5: TOF · axial · 0.5mm · 0.48mm/px · z∈[-133,-37]mm · 27 of 217 slices shown]
[im 1/217]
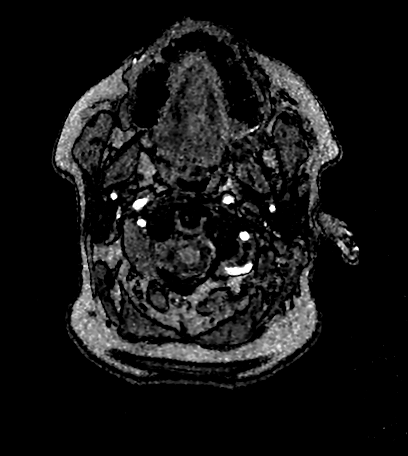
[im 5/217]
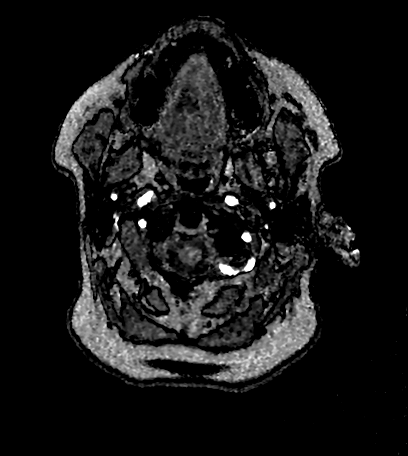
[im 10/217]
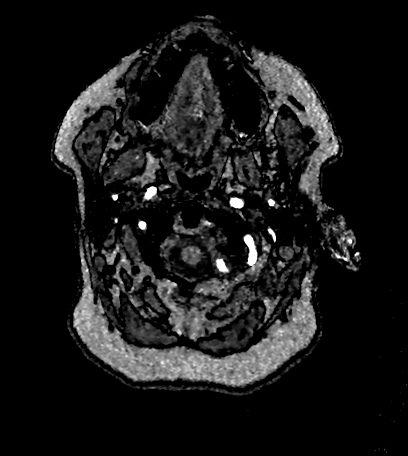
[im 14/217]
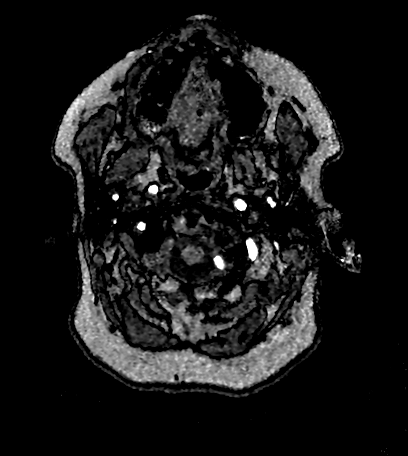
[im 19/217]
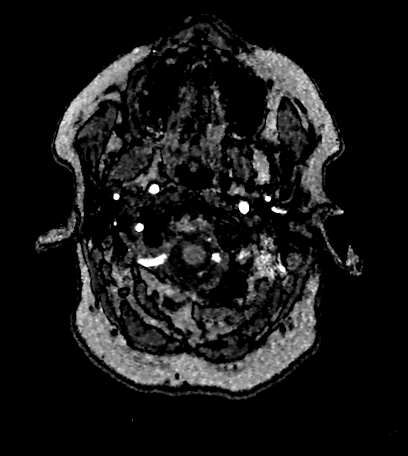
[im 23/217]
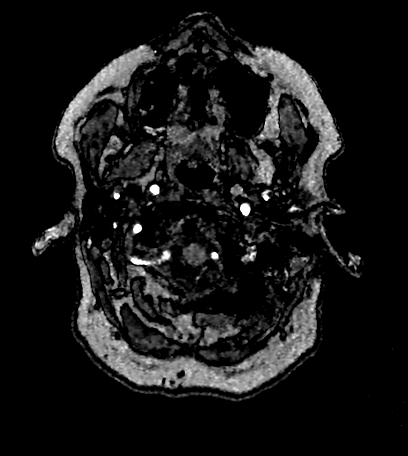
[im 28/217]
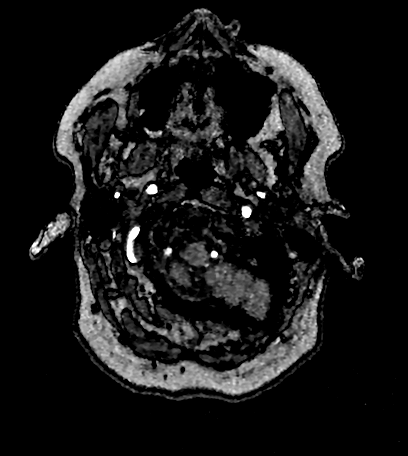
[im 33/217]
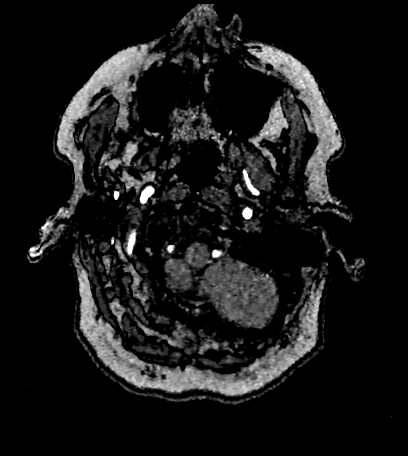
[im 37/217]
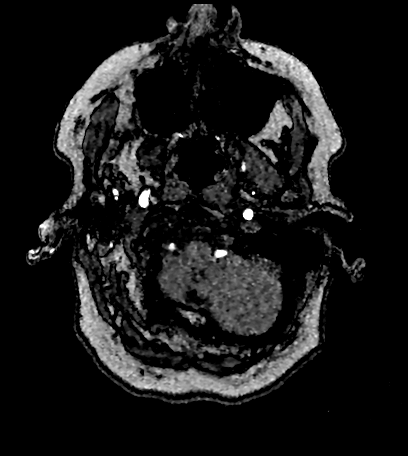
[im 42/217]
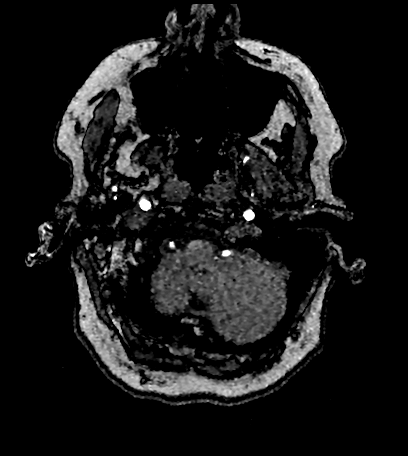
[im 46/217]
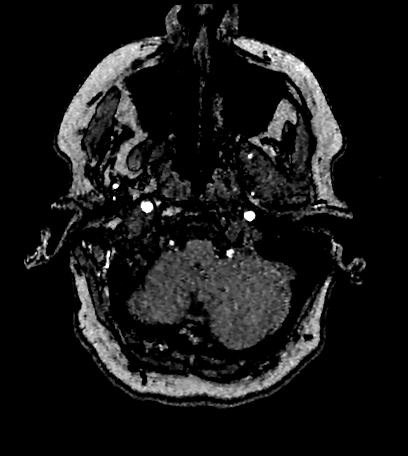
[im 51/217]
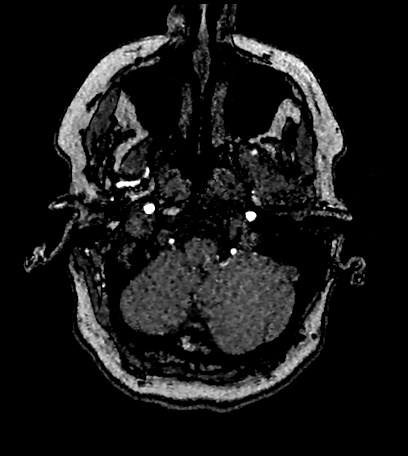
[im 56/217]
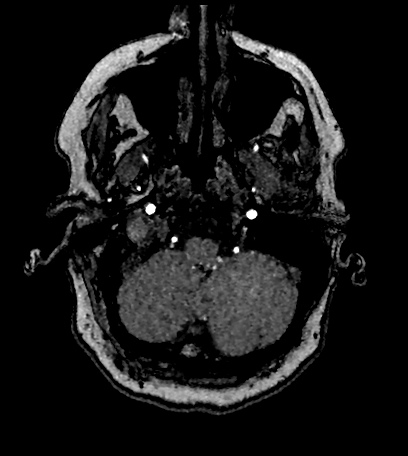
[im 60/217]
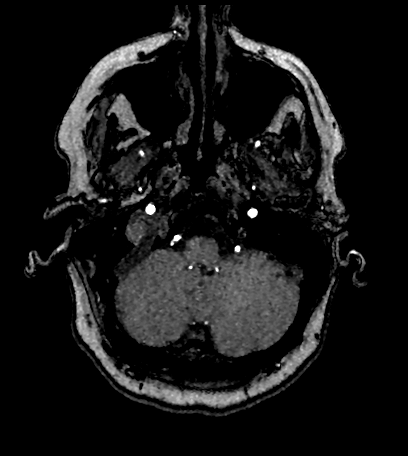
[im 65/217]
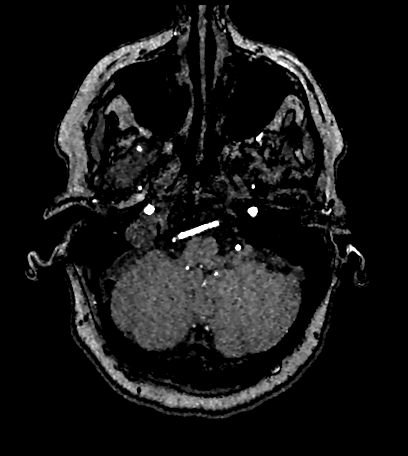
[im 69/217]
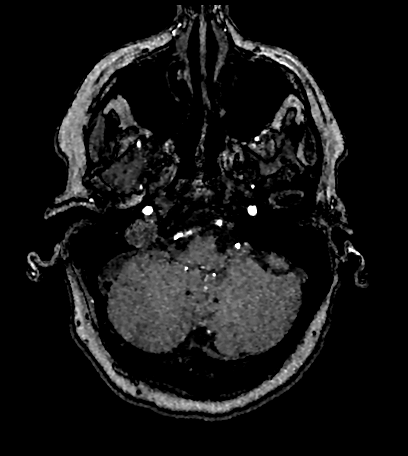
[im 74/217]
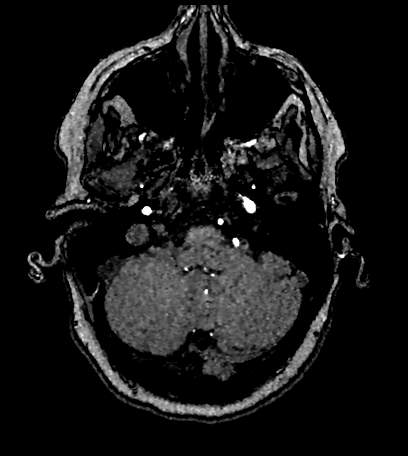
[im 79/217]
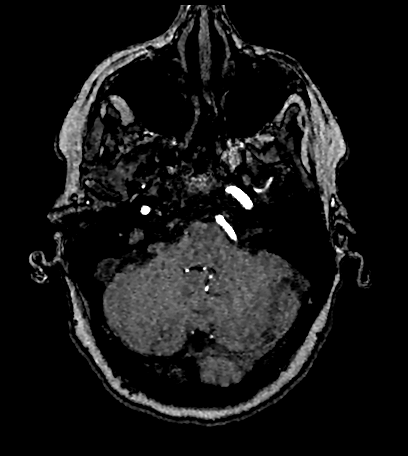
[im 83/217]
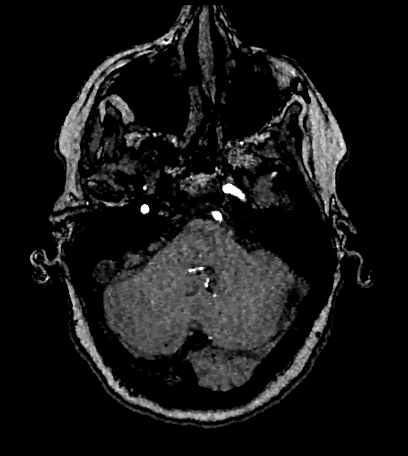
[im 88/217]
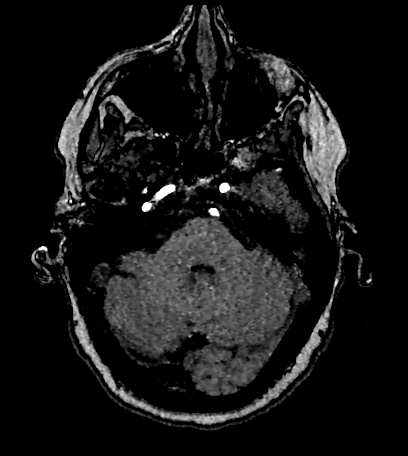
[im 97/217]
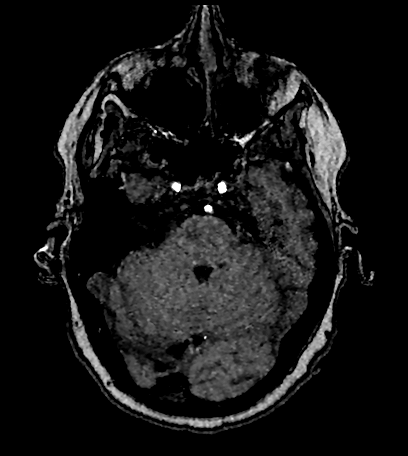
[im 111/217]
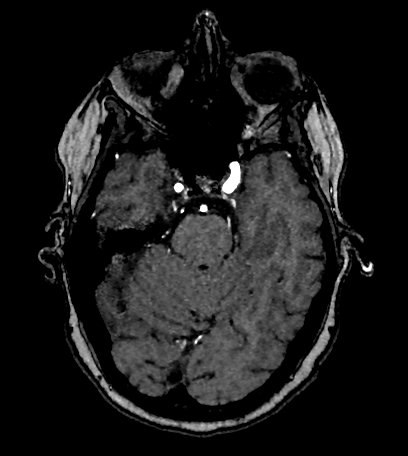
[im 125/217]
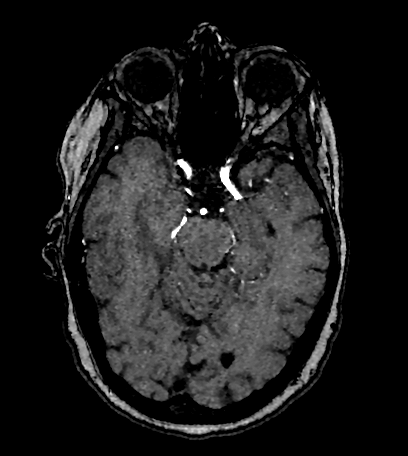
[im 152/217]
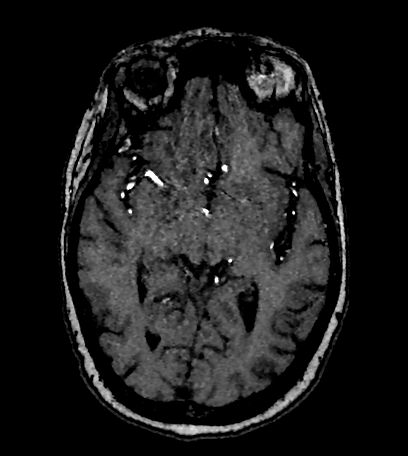
[im 180/217]
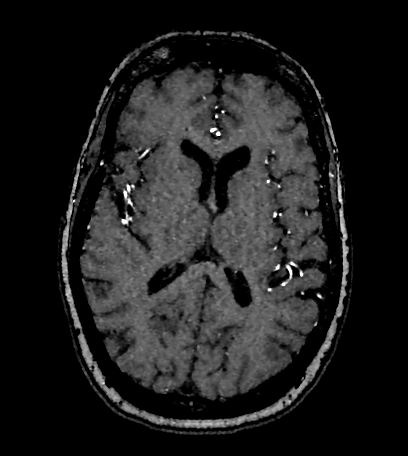
[im 184/217]
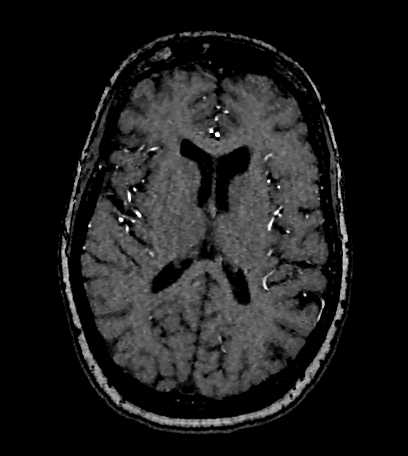
[im 207/217]
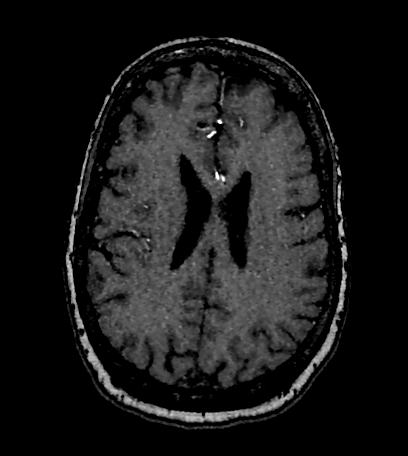

[27 of 48 positions shown; findings below may reference images not displayed]

FINDINGS: Anterior circulation:

The intracranial internal carotid arteries are patent. The M1 middle
cerebral arteries are patent. No M2 proximal branch occlusion or
high-grade proximal stenosis. The anterior cerebral arteries are
patent. A small (1 mm) aneurysm is questioned in the region of the
anterior communicating artery and left A1/A2 junction (series 5,
images 143 and 144).

Posterior circulation:

The intracranial vertebral arteries are patent. The basilar artery
is patent. The posterior cerebral arteries are patent. A right
posterior communicating artery is present. The left posterior
communicating artery is diminutive or absent.

Anatomic variants: As described.
IMPRESSION: 1. No intracranial large vessel occlusion or proximal high-grade
arterial stenosis.
2. Possible 1 mm aneurysm in the region of the anterior
communicating artery and left A1/A2 junction. A CTA of the head may
be helpful for further evaluation.

## 2022-01-07 DIAGNOSIS — H5203 Hypermetropia, bilateral: Secondary | ICD-10-CM | POA: Diagnosis not present

## 2022-01-07 DIAGNOSIS — H353131 Nonexudative age-related macular degeneration, bilateral, early dry stage: Secondary | ICD-10-CM | POA: Diagnosis not present

## 2022-01-07 DIAGNOSIS — H01009 Unspecified blepharitis unspecified eye, unspecified eyelid: Secondary | ICD-10-CM | POA: Diagnosis not present

## 2022-01-07 DIAGNOSIS — H519 Unspecified disorder of binocular movement: Secondary | ICD-10-CM | POA: Diagnosis not present

## 2022-01-07 DIAGNOSIS — H40013 Open angle with borderline findings, low risk, bilateral: Secondary | ICD-10-CM | POA: Diagnosis not present

## 2022-01-07 DIAGNOSIS — H532 Diplopia: Secondary | ICD-10-CM | POA: Diagnosis not present

## 2022-01-07 DIAGNOSIS — H04129 Dry eye syndrome of unspecified lacrimal gland: Secondary | ICD-10-CM | POA: Diagnosis not present

## 2022-01-11 ENCOUNTER — Telehealth: Payer: Self-pay | Admitting: Internal Medicine

## 2022-01-11 NOTE — Telephone Encounter (Signed)
Pt called stating if the provider think it would be a good idea to have certain test ordered. Pt would like to be called

## 2022-01-14 NOTE — Telephone Encounter (Signed)
Lm for pt to cb.

## 2022-01-30 ENCOUNTER — Telehealth: Payer: Self-pay | Admitting: *Deleted

## 2022-01-30 ENCOUNTER — Other Ambulatory Visit: Payer: Self-pay

## 2022-01-30 DIAGNOSIS — E7800 Pure hypercholesterolemia, unspecified: Secondary | ICD-10-CM

## 2022-01-30 DIAGNOSIS — E875 Hyperkalemia: Secondary | ICD-10-CM

## 2022-01-30 NOTE — Telephone Encounter (Addendum)
Please place future orders for lab appt.    FYI: This appt was scheduled by Judson Roch in May & she may not need any labs at this time.

## 2022-01-30 NOTE — Telephone Encounter (Signed)
I can see where she is due labs now.  She just had met b, urine and potassium check.  Luna Fuse, please notify that she does not need this lab appt

## 2022-01-30 NOTE — Telephone Encounter (Signed)
Lm for pt to cb to advise. Appointment cancelled.

## 2022-01-30 NOTE — Telephone Encounter (Signed)
Orders placed.

## 2022-01-31 DIAGNOSIS — E7800 Pure hypercholesterolemia, unspecified: Secondary | ICD-10-CM | POA: Diagnosis not present

## 2022-01-31 DIAGNOSIS — R079 Chest pain, unspecified: Secondary | ICD-10-CM | POA: Diagnosis not present

## 2022-01-31 DIAGNOSIS — I341 Nonrheumatic mitral (valve) prolapse: Secondary | ICD-10-CM | POA: Diagnosis not present

## 2022-01-31 DIAGNOSIS — R0602 Shortness of breath: Secondary | ICD-10-CM | POA: Diagnosis not present

## 2022-02-04 ENCOUNTER — Other Ambulatory Visit: Payer: PPO

## 2022-02-05 ENCOUNTER — Encounter: Payer: Self-pay | Admitting: Internal Medicine

## 2022-02-05 ENCOUNTER — Ambulatory Visit (INDEPENDENT_AMBULATORY_CARE_PROVIDER_SITE_OTHER): Payer: PPO | Admitting: Internal Medicine

## 2022-02-05 VITALS — BP 118/76 | HR 67 | Temp 98.0°F | Resp 14 | Ht 67.0 in | Wt 177.4 lb

## 2022-02-05 DIAGNOSIS — K219 Gastro-esophageal reflux disease without esophagitis: Secondary | ICD-10-CM | POA: Diagnosis not present

## 2022-02-05 DIAGNOSIS — R0602 Shortness of breath: Secondary | ICD-10-CM | POA: Diagnosis not present

## 2022-02-05 DIAGNOSIS — Z86010 Personal history of colonic polyps: Secondary | ICD-10-CM | POA: Diagnosis not present

## 2022-02-05 DIAGNOSIS — M25571 Pain in right ankle and joints of right foot: Secondary | ICD-10-CM | POA: Insufficient documentation

## 2022-02-05 DIAGNOSIS — M2559 Pain in other specified joint: Secondary | ICD-10-CM | POA: Diagnosis not present

## 2022-02-05 DIAGNOSIS — M5441 Lumbago with sciatica, right side: Secondary | ICD-10-CM

## 2022-02-05 DIAGNOSIS — F439 Reaction to severe stress, unspecified: Secondary | ICD-10-CM | POA: Diagnosis not present

## 2022-02-05 DIAGNOSIS — J4520 Mild intermittent asthma, uncomplicated: Secondary | ICD-10-CM | POA: Diagnosis not present

## 2022-02-05 DIAGNOSIS — H532 Diplopia: Secondary | ICD-10-CM | POA: Diagnosis not present

## 2022-02-05 DIAGNOSIS — E7800 Pure hypercholesterolemia, unspecified: Secondary | ICD-10-CM | POA: Diagnosis not present

## 2022-02-05 DIAGNOSIS — M25579 Pain in unspecified ankle and joints of unspecified foot: Secondary | ICD-10-CM

## 2022-02-05 NOTE — Patient Instructions (Signed)
Compression hose - 15-20 mm compression

## 2022-02-05 NOTE — Progress Notes (Signed)
Patient ID: Lynn Mann, female   DOB: September 20, 1950, 71 y.o.   MRN: [id number]   Subjective:    Patient ID: Lynn Mann, female    DOB: August 04, 1950, 71 y.o.   MRN: [id number]   Patient here for a scheduled follow up .   HPI Saw cardiology 01/31/22.  Previous exercise stress echo - no arrhythmia.  ECHO - normal LV function.  No significant ischemia.  No changes made. Previous sleep study - negative.  Recently saw neurology - given history of diplopia - persistent.  Lab testing unrevealing (including w/up for MG).  Was found to have low vitamin d level.  On replacement.  No chest pain.  Breathing stable.  Discussed benefiber and using daily to keep bowels moving.  No abdominal pain.  Discussed compression nose.  Persistent ankle pain.  Previous xray revealed soft tissue swelling.  No acute fracture.  Request evaluation by podiatry.  Planning for pelvic floor PT.  Given various joint pains/aching - request ANA to be checked.     Past Medical History:  Diagnosis Date   Arthritis    C. difficile diarrhea    age 51s-40s    Chicken pox    Cholecystitis 11/2011   Did not require sgy - Dr. Staci Acosta - [facility]  (cholelithiasis)   H/O Clostridium difficile infection    IBS (irritable bowel syndrome)    MRSA exposure 2005   Spider bite   MVP (mitral valve prolapse)    Stable - Dr. Ubaldo Glassing   Rheumatic fever    Past Surgical History:  Procedure Laterality Date   CATARACT EXTRACTION  [id number]   MUSCLE BIOPSY     Family History  Problem Relation Age of Onset   Arthritis Mother    Stroke Mother    Diabetes Mother    Hypertension Mother    Arthritis Father    Stroke Father    Diabetes Paternal Grandmother    Cancer Paternal Uncle        colon   Heart disease Other        maternal and paternal side   Breast cancer Paternal 4    Breast cancer Cousin    Breast cancer Cousin        female cousin   Breast cancer Cousin    Social History   Socioeconomic History   Marital status: Widowed     Spouse name: Not on file   Number of children: Not on file   Years of education: 16   Highest education level: Not on file  Occupational History   Occupation: Retired  Tobacco Use   Smoking status: Never   Smokeless tobacco: Never  Vaping Use   Vaping Use: Never used  Substance and Sexual Activity   Alcohol use: Not Currently    Comment: Rarely   Drug use: No   Sexual activity: Yes    Partners: Male    Birth control/protection: Post-menopausal  Other Topics Concern   Not on file  Social History Narrative   Widowed    2 kids son and daughter   Social Determinants of Health   Financial Resource Strain: [place]  (06/27/2021)   Overall Financial Resource Strain (CARDIA)    Difficulty of Paying Living Expenses: Not hard at all  Food Insecurity: No [place] (06/27/2021)   Hunger Vital Sign    Worried About Running Out of Food in the Last Year: Never true    [place] in the Last Year: Never  true  Transportation Needs: No Transportation Needs (06/27/2021)   PRAPARE - Hydrologist (Medical): No    Lack of Transportation (Non-Medical): No  Physical Activity: Insufficiently Active (06/27/2021)   Exercise Vital Sign    Days of Exercise per Week: 2 days    Minutes of Exercise per Session: 50 min  Stress: No Stress Concern Present (06/27/2021)   [place]    Feeling of Stress : Not at all  Social Connections: Unknown (06/27/2021)   Social Connection and Isolation Panel [NHANES]    Frequency of Communication with Friends and Family: More than three times a week    Frequency of Social Gatherings with Friends and Family: More than three times a week    Attends Religious Services: Not on file    Active Member of Clubs or Organizations: Not on file    Attends Archivist Meetings: Not on file    Marital Status: Widowed     Review of Systems  Constitutional:   Negative for appetite change and unexpected weight change.  HENT:  Negative for congestion and sinus pressure.   Respiratory:  Negative for cough, chest tightness and shortness of breath.   Cardiovascular:  Negative for chest pain, palpitations and leg swelling.  Gastrointestinal:  Negative for abdominal pain, diarrhea, nausea and vomiting.  Genitourinary:  Negative for difficulty urinating and dysuria.  Musculoskeletal:        Ankle pain as outlined.  Joint pains.   Skin:  Negative for color change and rash.  Neurological:  Negative for dizziness, light-headedness and headaches.  Psychiatric/Behavioral:  Negative for agitation and dysphoric mood.        Objective:     BP 118/76 (BP Location: Left Arm, Patient Position: Sitting, Cuff Size: Large)   Pulse 67   Temp 98 F (36.7 C) (Temporal)   Resp 14   Ht '5\' 7"'$  (1.702 m)   Wt 177 lb 6.4 oz (80.5 kg)   SpO2 99%   BMI 27.78 kg/m  Wt Readings from Last 3 Encounters:  02/05/22 177 lb 6.4 oz (80.5 kg)  12/26/21 176 lb 1.6 oz (79.9 kg)  12/06/21 179 lb (81.2 kg)    Physical Exam Vitals reviewed.  Constitutional:      General: She is not in acute distress.    Appearance: Normal appearance.  HENT:     Head: Normocephalic and atraumatic.     Right Ear: External ear normal.     Left Ear: External ear normal.  Eyes:     General: No scleral icterus.       Right eye: No discharge.        Left eye: No discharge.     Conjunctiva/sclera: Conjunctivae normal.  Neck:     Thyroid: No thyromegaly.  Cardiovascular:     Rate and Rhythm: Normal rate and regular rhythm.  Pulmonary:     Effort: No respiratory distress.     Breath sounds: Normal breath sounds. No wheezing.  Abdominal:     General: Bowel sounds are normal.     Palpations: Abdomen is soft.     Tenderness: There is no abdominal tenderness.  Musculoskeletal:        General: No swelling or tenderness.     Cervical back: Neck supple. No tenderness.  Lymphadenopathy:      Cervical: No cervical adenopathy.  Skin:    Findings: No erythema or rash.  Neurological:  Mental Status: She is alert.  Psychiatric:        Mood and Affect: Mood normal.        Behavior: Behavior normal.      Outpatient Encounter Medications as of 02/05/2022  Medication Sig   aspirin 325 MG tablet Take by mouth.   b complex vitamins capsule Take 1 capsule by mouth daily.   CALCIUM PO Take by mouth.   Cholecalciferol (VITAMIN D3 PO) Take by mouth.   cyclobenzaprine (FLEXERIL) 5 MG tablet Take 1 tablet (5 mg total) by mouth 3 (three) times daily as needed.   fluticasone (FLONASE) 50 MCG/ACT nasal spray Place 2 sprays into both nostrils daily. prn   meclizine (ANTIVERT) 25 MG tablet Take 0.5-1 tablets (12.5-25 mg total) by mouth 2 (two) times daily as needed for dizziness.   Multiple Vitamins-Minerals (PRESERVISION AREDS 2 PO) Take by mouth.   rosuvastatin (CRESTOR) 5 MG tablet Take 1 tablet (5 mg total) by mouth 3 (three) times a week. Mon, Wed, Fri   No facility-administered encounter medications on file as of 02/05/2022.     Lab Results  Component Value Date   WBC 6.1 12/26/2021   HGB 13.3 12/26/2021   HCT 40.8 12/26/2021   PLT 305.0 12/26/2021   GLUCOSE 85 02/05/2022   CHOL 213 (H) 12/06/2021   TRIG 190.0 (H) 12/06/2021   HDL 39.10 12/06/2021   LDLDIRECT 110.0 02/11/2017   LDLCALC 136 (H) 12/06/2021   ALT 27 12/26/2021   AST 19 12/26/2021   NA 141 02/05/2022   K 4.5 02/05/2022   CL 104 02/05/2022   CREATININE 0.68 02/05/2022   BUN 16 02/05/2022   CO2 28 02/05/2022   TSH 1.96 06/06/2021   HGBA1C 5.4 07/15/2017    MR ANGIO HEAD WO CONTRAST  Result Date: 01/08/2022 CLINICAL DATA:  Provided history: Diplopia. Additional history provided by scanning technologist: Vertigo. EXAM: MRA HEAD WITHOUT CONTRAST TECHNIQUE: Angiographic images of the Circle of Willis were acquired using MRA technique without intravenous contrast. COMPARISON:  Brain MRI 11/11/2021.  Head CT  12/26/2015. FINDINGS: Anterior circulation: The intracranial internal carotid arteries are patent. The M1 middle cerebral arteries are patent. No M2 proximal branch occlusion or high-grade proximal stenosis. The anterior cerebral arteries are patent. A small (1 mm) aneurysm is questioned in the region of the anterior communicating artery and left A1/A2 junction (series 5, images 143 and 144). Posterior circulation: The intracranial vertebral arteries are patent. The basilar artery is patent. The posterior cerebral arteries are patent. A right posterior communicating artery is present. The left posterior communicating artery is diminutive or absent. Anatomic variants: As described. IMPRESSION: 1. No intracranial large vessel occlusion or proximal high-grade arterial stenosis. 2. 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 A CTA of the head may be helpful for further evaluation. Electronically Signed   By: Kellie Simmering D.O.   On: 01/08/2022 08:58       Assessment & Plan:   Problem List Items Addressed This Visit     Ankle pain    Persistent issues with her ankle.  Xray as outlined.  Request referral to podiatry.       Relevant Orders   ANA (Completed)   Sedimentation rate (Completed)   C-reactive protein (Completed)   Ambulatory referral to Podiatry   Asthma    Breathing overall appears to be stable.  No sob noted.  Lungs clear.        Binocular vision  disorder with diplopia    Is followed by optometry.  Recently evaluated by neurology.  Labs unrevealing (including MG labs).  MRI - no acute abnormality.  Continue f/u with neurology.        GERD (gastroesophageal reflux disease) - Primary   History of colonic polyps    Colonoscopy 08/2017 - tubular adenoma.  Recommended f/u colonoscopy in 5 years.       Hypercholesterolemia    On crestor.  Low cholesterol diet and exercise.  Follow lipid panel and liver function tests.        Relevant Orders   Basic metabolic panel (Completed)   Hepatic function panel   Basic metabolic panel   Lipid panel   Joint ache    Varied joint ache.  Planning to see podiatry for her ankle.  Discussed further w/up.  Request labs.  Check rheum panel.       Low back pain   Relevant Orders   ANA (Completed)   Sedimentation rate (Completed)   C-reactive protein (Completed)   SOB (shortness of breath)    Previous sob.  Saw cardiology as outlined.  W/up as outlined - ECHO - normal LV function.  Stress echo - negative for ischemia.  No increased sob reported.  Follow.       Stress    Overall appears to be handling things relatively well.  Follow.         Einar Pheasant, MD

## 2022-02-06 ENCOUNTER — Ambulatory Visit: Payer: PPO | Admitting: Internal Medicine

## 2022-02-06 LAB — SEDIMENTATION RATE: Sed Rate: 15 mm/hr (ref 0–30)

## 2022-02-06 LAB — BASIC METABOLIC PANEL
BUN: 16 mg/dL (ref 6–23)
CO2: 28 mEq/L (ref 19–32)
Calcium: 9.7 mg/dL (ref 8.4–10.5)
Chloride: 104 mEq/L (ref 96–112)
Creatinine, Ser: 0.68 mg/dL (ref 0.40–1.20)
GFR: 88.87 mL/min (ref >60.00)
Glucose, Bld: 85 mg/dL (ref 70–99)
Potassium: 4.5 mEq/L (ref 3.5–5.1)
Sodium: 141 mEq/L (ref 135–145)

## 2022-02-06 LAB — C-REACTIVE PROTEIN: CRP: <1 mg/dL (ref 0.5–20.0)

## 2022-02-08 LAB — ANA: Anti Nuclear Antibody (ANA): POSITIVE — AB

## 2022-02-08 LAB — ANTI-NUCLEAR AB-TITER (ANA TITER): ANA Titer 1: 1:40 {titer} — ABNORMAL HIGH

## 2022-02-11 ENCOUNTER — Encounter: Payer: Self-pay | Admitting: Internal Medicine

## 2022-02-11 ENCOUNTER — Other Ambulatory Visit: Payer: Self-pay | Admitting: Internal Medicine

## 2022-02-11 ENCOUNTER — Telehealth: Payer: Self-pay | Admitting: Internal Medicine

## 2022-02-11 DIAGNOSIS — M2550 Pain in unspecified joint: Secondary | ICD-10-CM

## 2022-02-11 NOTE — Assessment & Plan Note (Signed)
On crestor.  Low cholesterol diet and exercise.  Follow lipid panel and liver function tests.   

## 2022-02-11 NOTE — Telephone Encounter (Signed)
Noted  

## 2022-02-11 NOTE — Assessment & Plan Note (Signed)
Varied joint ache.  Planning to see podiatry for her ankle.  Discussed further w/up.  Request labs.  Check rheum panel.

## 2022-02-11 NOTE — Assessment & Plan Note (Signed)
Persistent issues with her ankle.  Xray as outlined.  Request referral to podiatry.

## 2022-02-11 NOTE — Assessment & Plan Note (Signed)
Breathing overall appears to be stable.  No sob noted.  Lungs clear.

## 2022-02-11 NOTE — Telephone Encounter (Signed)
Patient called and wanted to let Dr Nicki Reaper know that she rarely has acid reflux.

## 2022-02-11 NOTE — Assessment & Plan Note (Signed)
Colonoscopy 08/2017 - tubular adenoma.  Recommended f/u colonoscopy in 5 years.  

## 2022-02-11 NOTE — Assessment & Plan Note (Signed)
Overall appears to be handling things relatively well.  Follow.   

## 2022-02-11 NOTE — Assessment & Plan Note (Signed)
Is followed by optometry.  Recently evaluated by neurology.  Labs unrevealing (including MG labs).  MRI - no acute abnormality.  Continue f/u with neurology.   

## 2022-02-11 NOTE — Progress Notes (Signed)
Order placed for rheumatology referral.  

## 2022-02-11 NOTE — Assessment & Plan Note (Signed)
Previous sob.  Saw cardiology as outlined.  W/up as outlined - ECHO - normal LV function.  Stress echo - negative for ischemia.  No increased sob reported.  Follow.

## 2022-02-12 ENCOUNTER — Ambulatory Visit: Payer: PPO | Attending: Internal Medicine | Admitting: Physical Therapy

## 2022-02-12 DIAGNOSIS — M5459 Other low back pain: Secondary | ICD-10-CM | POA: Insufficient documentation

## 2022-02-12 DIAGNOSIS — M6281 Muscle weakness (generalized): Secondary | ICD-10-CM | POA: Diagnosis not present

## 2022-02-12 DIAGNOSIS — R278 Other lack of coordination: Secondary | ICD-10-CM | POA: Diagnosis not present

## 2022-02-12 DIAGNOSIS — R32 Unspecified urinary incontinence: Secondary | ICD-10-CM | POA: Diagnosis not present

## 2022-02-12 NOTE — Therapy (Signed)
OUTPATIENT PHYSICAL THERAPY EVALUATION   Patient Name: Lynn Mann MRN: [id number] DOB:1951/06/05, 71 y.o., female Today's Date: 02/13/2022   PT End of Session - 02/12/22 1426     Visit Number 1    Number of Visits 10    Date for PT Re-Evaluation 04/23/22    PT Start Time 2263    PT Stop Time 1500    PT Time Calculation (min) 40 min             Past Medical History:  Diagnosis Date   Arthritis    C. difficile diarrhea    age 52s-40s    Chicken pox    Cholecystitis 11/2011   Did not require sgy - Dr. Staci Acosta - [facility]  (cholelithiasis)   H/O Clostridium difficile infection    IBS (irritable bowel syndrome)    MRSA exposure 2005   Spider bite   MVP (mitral valve prolapse)    Stable - Dr. Ubaldo Glassing   Rheumatic fever    Past Surgical History:  Procedure Laterality Date   CATARACT EXTRACTION  [id number]   MUSCLE BIOPSY     Patient Active Problem List   Diagnosis Date Noted   Joint ache 02/11/2022   Ankle pain 02/05/2022   Fever 12/26/2021   Abdominal pain 12/26/2021   Cough 09/07/2021   Right shoulder pain 07/15/2021   Hematoma 02/11/2021   Ankle swelling 11/26/2020   Low back pain 11/22/2020   Hip pain, right 11/22/2020   Light headedness 06/24/2020   Hearing loss 06/24/2020   Binocular vision disorder with diplopia 06/24/2020   Stress 05/17/2020   Chest pain 05/16/2020   Welcome to Medicare preventive visit 06/20/2019   Viral syndrome 01/30/2019   Itching 07/21/2017   Asthma 07/16/2016   Urinary incontinence 07/16/2016   SOB (shortness of breath) 04/01/2016   Bilateral shoulder pain 02/24/2016   Fatigue 01/28/2016   Neck fullness 01/28/2016   Routine general medical examination at a health care facility 07/25/2015   Health care maintenance 10/09/2014   Neck pain 11/26/2013   Hypercholesterolemia 11/26/2013   History of colonic polyps 05/11/2013   Hyperbilirubinemia 01/18/2013   GERD (gastroesophageal reflux disease) 12/03/2012   Cholelithiasis  11/07/2012   IBS (irritable bowel syndrome) 11/07/2012   History of rheumatic fever 08/28/2012   MVP (mitral valve prolapse) 08/28/2012    PCP: [facility]  REFERRING PROVIDER:  PCP   REFERRING DIAG: R32 (ICD-10-CM) - Urinary incontinence, unspecified type  Rationale for Evaluation and Treatment Rehabilitation  THERAPY DIAG:  Muscle weakness (generalized)  Other low back pain  Other lack of coordination  ONSET DATE:   SUBJECTIVE:  SUBJECTIVE STATEMENT: 1) Urinary incontinence : Pt is wearing Depends. Pt goes through two pads during the day and 4-5  per night. Nocturia 4-5 x per night. Pt had a sleep study 3 years ago and was told she had sleep apnea but her cardiologist reviewed the sleep study and told her she did not need the CPAP machine. Pt saw cardiologist last week for a check up. 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  Pt 's last visit with Dr. Nicki Reaper.  Pt is not able to make it to the bathroom before leaking.   2) LBP started 2 weeks ago. At night it started hurting when she rolls over. Pt typically sleeps on her side.  Pt sleeps with pillow between her knees off and on. During the day , the LBP does not hurt. 7/10 at worst.  Easing factors: getting up, stretching out. Pt points at the center of L/S and gluts.   3)   B ankle pain: Pt had a fall last year and her B feet turned back. Pt had no broken bones. Swelling is progressing after standing any time. When she walks on them.     PERTINENT HISTORY:    PAIN:  Are you having pain? Yes : see above    PRECAUTIONS: None  WEIGHT BEARING RESTRICTIONS No  FALLS:  Has patient fallen in last 6 months? Yes  LIVING ENVIRONMENT: Lives with:  lives alone Lives in: House/apartment Stairs: Yes: External: 1 steps; none   OCCUPATION:  retired   PLOF: Independent and Independent with basic ADLs  PATIENT GOALS                Not have accidents with leakage and to sleep more   OBJECTIVE:    [facility] PT Assessment - 02/13/22 0844       Posture/Postural Control   Posture Comments minimal forward head, levelled shoulder and pelvic girdle, slumped posture      AROM   Overall AROM Comments Spinal R rotation caused discomfort at R SIJ but reproduced trhe LBP      PROM   Overall PROM Comments R hip ext limited than L,      Strength   Overall Strength Comments RLE 4/5, L5/5 , hip abd R 3+/5 caused pain at the SIJ, L hip abd 4+/5      Transfers   Comments sit to stand with adducted knees             [facility] Adult PT Treatment/Exercise - 02/13/22 0844       Therapeutic Activites    Therapeutic Activities Other Therapeutic Activities    Other Therapeutic Activities see pt education section      Neuro Re-ed    Neuro Re-ed Details  cued for sit to stand with less adducted knees B, sitting posture, explained rationale for pelvic girdle stability to minimize urinary incontinence and LBP                   HOME EXERCISE PROGRAM: See pt instruction section    ASSESSMENT:  CLINICAL IMPRESSION:  Pt is a  71  yo  who presents with urinary incontinence, nocturia, LBP, and B ankle pain which impact her ADLs and QOL.   Pt's musculoskeletal assessment revealed limited spinal /pelvic mobility, R hip weakness, poor body mechanics which places strain on the abdominal/pelvic floor mm, and poor alignment in sit to stand ( adducted knees).  Plan to assess ankles and feet at next session.  Pt will benefit from coordination training and education on fitness and functional positions in order to gain a more effective intraabdominal pressure system to minimize urinary leakage. Pt plans to join the [facility] and an Arthritis Aquatic Class.   Pt was provided education on etiology of Sx with anatomy, physiology explanation with  images along with the benefits of customized pelvic PT Tx based on pt's medical conditions and musculoskeletal deficits.  Explained the physiology of deep core mm coordination and roles of pelvic floor function in urination, defecation, sexual function, and postural control with deep core mm system.   Regional interdependent approaches will yield greater benefits in pt's POC. Pt would benefit from sleep study referral as pt reported 4-5 episodes of nocturia which is a risk factor for OSA. Pt also has additional risk factors for OSA ( ie. Cardiac). Plan to communicate with her PCP re: this concern.   Following Tx today, pt demo'd proper alignment with sit to stand.    OBJECTIVE IMPAIRMENTS Abnormal gait, decreased balance, decreased coordination, decreased endurance, decreased mobility, decreased ROM, decreased strength, hypomobility, increased edema, impaired perceived functional ability, increased muscle spasms, impaired flexibility, improper body mechanics, postural dysfunction, and pain.   ACTIVITY LIMITATIONS sitting, standing, bed mobility, continence, and toileting  PARTICIPATION LIMITATIONS: interpersonal relationship, community activity, and occupation  PERSONAL FACTORS Age, Education, Fitness, Past/current experiences, and Social background are also affecting patient's functional outcome.   REHAB POTENTIAL: Good  CLINICAL DECISION MAKING: Evolving/moderate complexity  EVALUATION COMPLEXITY: Moderate   PATIENT EDUCATION:   Education details: Showed pt anatomy images. Explained muscles attachments/ connection, physiology of deep core system/ spinal- thoracic-pelvis-lower kinetic chain as they relate to pt's presentation, Sx, and past Hx. Explained what and how these areas of deficits need to be restored to balance and function   Person educated: Patient Education method: Explanation, Demonstration, Tactile cues, Verbal cues, and Handouts Education comprehension: verbalized  understanding, returned demonstration, verbal cues required, tactile cues required, and needs further education   PLAN: PT FREQUENCY: 1x/week  PT DURATION: 10 week  PLANNED INTERVENTIONS: Therapeutic exercises, Therapeutic activity, Neuromuscular re-education, Balance training, Gait training, Patient/Family education, Self Care, Joint mobilization, Dry Needling, Spinal mobilization, Cryotherapy, Moist heat, Taping, and Manual therapy.  PLAN FOR NEXT SESSION:  See clinical impression for plan     GOALS: Goals reviewed with patient? Yes  SHORT TERM GOALS: Target date: 03/13/2022  Pt will be IND with HEP Baseline: not IND Goal status: INITIAL   LONG TERM GOALS: Target date: 04/24/2022  Pt will report decreased pads during day to 1 pad and < 2 pads at night.  Baseline: two pads during the day and 4-5  per night. Nocturia 4-5 x per night.  Goal status: INITIAL  2.  Pt will be referred to sleep study to rule in/ out OSA due to high nocturia episodes and risk factors  Baseline: sleep study 3 years ago Goal status: INITIAL  3.  Pt will demo increased R LE to improve pelvic stability/ balance  Baseline:  RLE 4/5, L 5/5  Goal status: INITIAL  4.  Pt will demo > 5 pt change on Lumbar FOTO to improve QOL and functional mobility   Baseline: 60 pts Goal status: INITIAL  5.  Pt will demo > 5 pt change on FOTO  to improve QOL and function  Urinary Problem baseline 37 pts PFDI Urinary  baseline  25 pts     Goal status: INITIAL  6.  Pt will demo increased R hip abd  and R hip flex/knee flex/ ext to > 4/5 in order to promote pelvic stability and strength for walking  Baseline: RLE 4/5, L5/5 , hip abd R 3+/5 caused pain at the SIJ, L hip abd 4+/5  Goal status: INITIAL   Jerl Mina, PT 02/13/2022, 8:49 AM

## 2022-02-12 NOTE — Patient Instructions (Signed)
Wide knees with    Proper body mechanics with getting out of a chair to decrease strain  on back &pelvic floor   Avoid holding your breath when Getting out of the chair:  Scoot to front part of chair chair Heels behind knees, feet are hip width apart, nose over toes  Inhale like you are smelling roses Exhale to stand

## 2022-02-15 ENCOUNTER — Telehealth: Payer: Self-pay | Admitting: Internal Medicine

## 2022-02-15 NOTE — Telephone Encounter (Signed)
Sent mychart message

## 2022-02-15 NOTE — Telephone Encounter (Signed)
Lynn Mann has previously had sleep study.  Her recent evaluation by PT for pelvic floor therapy - revealed increased nocturia.  I would like to f/u and pursue further w/up regarding possible sleep apnea.  If agreeable, would like to refer to pulmonary for evaluation for possible sleep apnea and question of need for home sleep test.  If agreeable, let me know and I will place the order.

## 2022-02-19 DIAGNOSIS — M25572 Pain in left ankle and joints of left foot: Secondary | ICD-10-CM | POA: Diagnosis not present

## 2022-02-19 DIAGNOSIS — M65871 Other synovitis and tenosynovitis, right ankle and foot: Secondary | ICD-10-CM | POA: Diagnosis not present

## 2022-02-19 DIAGNOSIS — G8929 Other chronic pain: Secondary | ICD-10-CM | POA: Diagnosis not present

## 2022-02-19 DIAGNOSIS — M2011 Hallux valgus (acquired), right foot: Secondary | ICD-10-CM | POA: Diagnosis not present

## 2022-02-19 DIAGNOSIS — M25571 Pain in right ankle and joints of right foot: Secondary | ICD-10-CM | POA: Diagnosis not present

## 2022-02-19 DIAGNOSIS — M65872 Other synovitis and tenosynovitis, left ankle and foot: Secondary | ICD-10-CM | POA: Diagnosis not present

## 2022-02-19 DIAGNOSIS — M25372 Other instability, left ankle: Secondary | ICD-10-CM | POA: Diagnosis not present

## 2022-02-19 DIAGNOSIS — K219 Gastro-esophageal reflux disease without esophagitis: Secondary | ICD-10-CM | POA: Diagnosis not present

## 2022-02-19 DIAGNOSIS — M25371 Other instability, right ankle: Secondary | ICD-10-CM | POA: Diagnosis not present

## 2022-02-25 ENCOUNTER — Other Ambulatory Visit: Payer: Self-pay | Admitting: Internal Medicine

## 2022-02-25 DIAGNOSIS — Z1231 Encounter for screening mammogram for malignant neoplasm of breast: Secondary | ICD-10-CM

## 2022-02-26 ENCOUNTER — Ambulatory Visit: Payer: PPO | Attending: Internal Medicine | Admitting: Physical Therapy

## 2022-02-26 DIAGNOSIS — M25511 Pain in right shoulder: Secondary | ICD-10-CM | POA: Insufficient documentation

## 2022-02-26 DIAGNOSIS — M545 Low back pain, unspecified: Secondary | ICD-10-CM | POA: Insufficient documentation

## 2022-02-26 DIAGNOSIS — M25551 Pain in right hip: Secondary | ICD-10-CM | POA: Insufficient documentation

## 2022-02-26 DIAGNOSIS — M6281 Muscle weakness (generalized): Secondary | ICD-10-CM | POA: Diagnosis not present

## 2022-02-26 DIAGNOSIS — R278 Other lack of coordination: Secondary | ICD-10-CM | POA: Diagnosis not present

## 2022-02-26 DIAGNOSIS — G8929 Other chronic pain: Secondary | ICD-10-CM | POA: Insufficient documentation

## 2022-02-26 DIAGNOSIS — M5459 Other low back pain: Secondary | ICD-10-CM | POA: Insufficient documentation

## 2022-02-26 NOTE — Therapy (Unsigned)
OUTPATIENT PHYSICAL THERAPY [facility]   Patient Name: Lynn Mann MRN: [id number] DOB:08-Oct-1950, 71 y.o., female Today's Date: 02/27/2022   PT End of Session - 02/26/22 1040     Visit Number 2    Number of Visits 10    Date for PT Re-Evaluation 04/23/22    PT Start Time 1030    PT Stop Time 1128    PT Time Calculation (min) 58 min             Past Medical History:  Diagnosis Date   Arthritis    C. difficile diarrhea    age 56s-40s    Chicken pox    Cholecystitis 11/2011   Did not require sgy - Dr. Staci Acosta - [facility]  (cholelithiasis)   H/O Clostridium difficile infection    IBS (irritable bowel syndrome)    MRSA exposure 2005   Spider bite   MVP (mitral valve prolapse)    Stable - Dr. Ubaldo Glassing   Rheumatic fever    Past Surgical History:  Procedure Laterality Date   CATARACT EXTRACTION  [id number]   MUSCLE BIOPSY     Patient Active Problem List   Diagnosis Date Noted   Joint ache 02/11/2022   Ankle pain 02/05/2022   Fever 12/26/2021   Abdominal pain 12/26/2021   Cough 09/07/2021   Right shoulder pain 07/15/2021   Hematoma 02/11/2021   Ankle swelling 11/26/2020   Low back pain 11/22/2020   Hip pain, right 11/22/2020   Light headedness 06/24/2020   Hearing loss 06/24/2020   Binocular vision disorder with diplopia 06/24/2020   Stress 05/17/2020   Chest pain 05/16/2020   Welcome to Medicare preventive visit 06/20/2019   Viral syndrome 01/30/2019   Itching 07/21/2017   Asthma 07/16/2016   Urinary incontinence 07/16/2016   SOB (shortness of breath) 04/01/2016   Bilateral shoulder pain 02/24/2016   Fatigue 01/28/2016   Neck fullness 01/28/2016   Routine general medical examination at a health care facility 07/25/2015   Health care maintenance 10/09/2014   Neck pain 11/26/2013   Hypercholesterolemia 11/26/2013   History of colonic polyps 05/11/2013   Hyperbilirubinemia 01/18/2013   GERD (gastroesophageal reflux disease) 12/03/2012   Cholelithiasis  11/07/2012   IBS (irritable bowel syndrome) 11/07/2012   History of rheumatic fever 08/28/2012   MVP (mitral valve prolapse) 08/28/2012    PCP: [facility]  REFERRING PROVIDER:  PCP   REFERRING DIAG: R32 (ICD-10-CM) - Urinary incontinence, unspecified type  Rationale for Evaluation and Treatment Rehabilitation  THERAPY DIAG:  Muscle weakness (generalized)  Other low back pain  Other lack of coordination  Chronic right shoulder pain  Pain in right hip  Acute right-sided low back pain, unspecified whether sciatica present  ONSET DATE:   SUBJECTIVE:  SUBJECTIVE STATEMENT: 1) Urinary incontinence :no changes.   2) LBP: It is feeling better after she got a cortisone shot in her feet    3)   B ankle pain: Pt got a cortisone shot B feet and it has helped by 25% on pain. Pain occurs when bending forward before standing up.     PERTINENT HISTORY:    PAIN:  Are you having pain? Yes : LBP:  2/10, B ankle : 2/10   PRECAUTIONS: None  WEIGHT BEARING RESTRICTIONS No  FALLS:  Has patient fallen in last 6 months? Yes  LIVING ENVIRONMENT: Lives with:  lives alone Lives in: House/apartment Stairs: Yes: External: 1 steps; none   OCCUPATION: retired   PLOF: Independent and Independent with basic ADLs  PATIENT GOALS                Not have accidents with leakage and to sleep more   OBJECTIVE:   [facility] PT Assessment - 02/26/22 1047       Single Leg Stance   Comments unable to proceed past 5 reps on R,  double heel raises with UE support, 10 reps but R ankle burning complaints after double heel raises 6 reps,      Sit to Stand   Comments genu valgus, medial collapse of medial arches. Cued for alignment, 6 reps and then c.o burning at ankle dorsum aspects B      Palpation   SI  assessment  supine: L ASIS more anteriorly rotated, tightness along glut,    Palpation comment tightness along L lateral leg / knee and lateral ankle,  R lateral leg at knee      Bed Mobility   Bed Mobility --   reported dizziness with rolling, Hx of vertigo             [facility] Adult PT Treatment/Exercise - 02/27/22 1217       Bed Mobility   Bed Mobility --   reported dizziness with rolling, Hx of vertigo     Therapeutic Activites    Other Therapeutic Activities emphasized cues for alignment in sit to stand to minimize genu valgus B      Neuro Re-ed    Neuro Re-ed Details  cued for clam shells and stretches to strengthen pelvic girdle      Manual Therapy   Manual therapy comments STM/MWM at problem areas noted in assessment to promote more DF in sit to stand and more knee abduction               HOME EXERCISE PROGRAM: See pt instruction section    ASSESSMENT:  CLINICAL IMPRESSION: Pt required manual Tx to address L lateral leg to optimize mobility and alignment for more DF and knee ER in sit to stand. Pt required cues for hip abduction strengthening to minimize genu valgus.  Pt's feet pain occurs with dorsum aspect when ankle bends due to the way pt fell with ankle in PF.    Pt reported dizziness with t/f and reported Hx of vertigo and difficulty with focusing both eyes. PT asked PCP for vestibular rehab in case pt will need it. Plan to monitor and modify positional changes to minimize dizziness.  Plan to address medial collapse with medial arches in sit to stand at next session.   Sit to stand is the task where she experiences leakage and also with feet pain. This is remain area of focus in these upcoming sessions.  Pt benefits from skilled PT  OBJECTIVE IMPAIRMENTS Abnormal gait, decreased balance, decreased coordination, decreased endurance, decreased mobility, decreased ROM, decreased strength, hypomobility, increased edema, impaired perceived functional  ability, increased muscle spasms, impaired flexibility, improper body mechanics, postural dysfunction, and pain.   ACTIVITY LIMITATIONS sitting, standing, bed mobility, continence, and toileting  PARTICIPATION LIMITATIONS: interpersonal relationship, community activity, and occupation  PERSONAL FACTORS Age, Education, Fitness, Past/current experiences, and Social background are also affecting patient's functional outcome.   REHAB POTENTIAL: Good  CLINICAL DECISION MAKING: Evolving/moderate complexity  EVALUATION COMPLEXITY: Moderate   PATIENT EDUCATION:   Education details: Showed pt anatomy images. Explained muscles attachments/ connection, physiology of deep core system/ spinal- thoracic-pelvis-lower kinetic chain as they relate to pt's presentation, Sx, and past Hx. Explained what and how these areas of deficits need to be restored to balance and function   Person educated: Patient Education method: Explanation, Demonstration, Tactile cues, Verbal cues, and Handouts Education comprehension: verbalized understanding, returned demonstration, verbal cues required, tactile cues required, and needs further education   PLAN: PT FREQUENCY: 1x/week  PT DURATION: 10 week  PLANNED INTERVENTIONS: Therapeutic exercises, Therapeutic activity, Neuromuscular re-education, Balance training, Gait training, Patient/Family education, Self Care, Joint mobilization, Dry Needling, Spinal mobilization, Cryotherapy, Moist heat, Taping, and Manual therapy.  PLAN FOR NEXT SESSION:  See clinical impression for plan     GOALS: Goals reviewed with patient? Yes  SHORT TERM GOALS: Target date: 03/27/2022  Pt will be IND with HEP Baseline: not IND Goal status: INITIAL   LONG TERM GOALS: Target date: 05/08/2022  Pt will report decreased pads during day to 1 pad and < 2 pads at night.  Baseline: two pads during the day and 4-5  per night. Nocturia 4-5 x per night.  Goal status: INITIAL  2.  Pt  will be referred to sleep study to rule in/ out OSA due to high nocturia episodes and risk factors  Baseline: sleep study 3 years ago Goal status: INITIAL  3.  Pt will demo increased R LE to improve pelvic stability/ balance  Baseline:  RLE 4/5, L 5/5  Goal status: INITIAL  4.  Pt will demo > 5 pt change on Lumbar FOTO to improve QOL and functional mobility   Baseline: 60 pts Goal status: INITIAL  5.  Pt will demo > 5 pt change on FOTO  to improve QOL and function  Urinary Problem baseline 37 pts PFDI Urinary  baseline  25 pts     Goal status: INITIAL  6.  Pt will demo increased R hip abd and R hip flex/knee flex/ ext to > 4/5 in order to promote pelvic stability and strength for walking  Baseline: RLE 4/5, L5/5 , hip abd R 3+/5 caused pain at the SIJ, L hip abd 4+/5  Goal status: INITIAL   Jerl Mina, PT 02/27/2022, 12:28 PM

## 2022-02-26 NOTE — Patient Instructions (Addendum)
   Clam Shell 45 Degrees  Lying with hips and knees bent 45, one pillow between knees and ankles. Heel together, toes apart like ballerina,  Lift knee with exhale while pressing heels together. Be sure pelvis does not roll backward. Do not arch back. Do 20 times, each leg, 2 times per day.    Complimentary stretch: Figure-4  Seated  3 breaths     __   Proper body mechanics with getting out of a chair to decrease strain  on back &pelvic floor   Avoid holding your breath when Getting out of the chair:  Hands on chair to push off  Scoot to front part of chair chair Heels behind knees, feet are hip width apart,  Knees are aligned along 2-3rd toe lines, Weight across ballmounds of feet to not collapse arches      nose over toes  Inhale like you are smelling roses Exhale to stand    _  _   Avoid flip flop wearing, replace your tennis shoe with HOKUS as recommended by your podiatrist Follow up with Dr. Bettey Costa about your preference to go to ta sleep clinic instead of a home unit  And an PT  order for vertigo

## 2022-02-27 ENCOUNTER — Telehealth: Payer: Self-pay | Admitting: Internal Medicine

## 2022-02-27 DIAGNOSIS — R42 Dizziness and giddiness: Secondary | ICD-10-CM

## 2022-02-27 NOTE — Telephone Encounter (Signed)
Pt called stating she would like to discuss some things with the provider and also about the sleep clinic referral

## 2022-02-27 NOTE — Telephone Encounter (Signed)
I received a request from her therapist :  "she is having vertigo again and she had a Hx of it. I would like to ask if you can submit an order for vestibular rehab for her vertigo. I will work with our PT who is specialized in it and get Kiylah in with her for a few visits while we put Pelvic PT  on hold during those weeks. I want to ensure the vertigo does not worsen and thus, is priority now."  I have placed the order for vestibular rehab.  Also, see previous message (02/15/22). Ms Ohanian has previously had sleep study.  Her recent evaluation by PT for pelvic floor therapy - revealed increased nocturia.  I would like to f/u and pursue further w/up regarding possible sleep apnea.  If agreeable, would like to refer to pulmonary for evaluation for possible sleep apnea and question of need for sleep study.  If agreeable, let me know and I will place the order. (We never heard back from her if agreeable).  If agreeable, let me know.

## 2022-02-27 NOTE — Telephone Encounter (Signed)
error 

## 2022-02-28 NOTE — Telephone Encounter (Signed)
S/w pt - is agreeable to eval for sleep apnea.  Also had several questions : 1 - Had cortisone shots in both feet - feels like inflammation all over her body has improved - feels great - wants to know if there is something that she can do daily to maintain feeling. 2 - Thinks excess "weight" or "bloat" is all water - wants to discuss starting low dose lasix possibly 3 - Has been taking Crestor M, W, F - doing well on it. Would like to have cholesterol checked to see if it is helping her levels. Is comfortable going up to daily if levels still need improvement. Last lipid panel was 5/11.   Pt scheduled for 8/14 to address these points with you.

## 2022-03-05 ENCOUNTER — Ambulatory Visit: Payer: PPO | Admitting: Physical Therapy

## 2022-03-05 DIAGNOSIS — M6281 Muscle weakness (generalized): Secondary | ICD-10-CM

## 2022-03-05 DIAGNOSIS — M79642 Pain in left hand: Secondary | ICD-10-CM | POA: Diagnosis not present

## 2022-03-05 DIAGNOSIS — M25562 Pain in left knee: Secondary | ICD-10-CM | POA: Diagnosis not present

## 2022-03-05 DIAGNOSIS — Z8679 Personal history of other diseases of the circulatory system: Secondary | ICD-10-CM | POA: Diagnosis not present

## 2022-03-05 DIAGNOSIS — M25561 Pain in right knee: Secondary | ICD-10-CM | POA: Diagnosis not present

## 2022-03-05 DIAGNOSIS — M1990 Unspecified osteoarthritis, unspecified site: Secondary | ICD-10-CM | POA: Diagnosis not present

## 2022-03-05 DIAGNOSIS — R768 Other specified abnormal immunological findings in serum: Secondary | ICD-10-CM | POA: Diagnosis not present

## 2022-03-05 DIAGNOSIS — M5459 Other low back pain: Secondary | ICD-10-CM

## 2022-03-05 DIAGNOSIS — M359 Systemic involvement of connective tissue, unspecified: Secondary | ICD-10-CM | POA: Diagnosis not present

## 2022-03-05 DIAGNOSIS — R278 Other lack of coordination: Secondary | ICD-10-CM

## 2022-03-05 DIAGNOSIS — H04123 Dry eye syndrome of bilateral lacrimal glands: Secondary | ICD-10-CM | POA: Diagnosis not present

## 2022-03-05 DIAGNOSIS — M79641 Pain in right hand: Secondary | ICD-10-CM | POA: Diagnosis not present

## 2022-03-05 DIAGNOSIS — R682 Dry mouth, unspecified: Secondary | ICD-10-CM | POA: Diagnosis not present

## 2022-03-05 NOTE — Patient Instructions (Signed)
Heels on top of a rolled towel placed under the hip in a seated position   - ballmounds down, heel on the towel , do not move heel Lift the ballmounds , press ballmounds down, toes spread  20 reps   __  .band under ballmounds, lower the ballmounds down on the ground, then the heel slowly,  20 reps

## 2022-03-05 NOTE — Therapy (Addendum)
OUTPATIENT PHYSICAL THERAPY Treatment   Patient Name: SANVIKA CUTTINO MRN: [id number] DOB:November 10, 1950, 71 y.o., female Today's Date: 03/05/2022   PT End of Session - 03/05/22 1515     Visit Number 3    Number of Visits 10    Date for PT Re-Evaluation 04/23/22    PT Start Time 57    PT Stop Time 1545    PT Time Calculation (min) 42 min             Past Medical History:  Diagnosis Date   Arthritis    C. difficile diarrhea    age 25s-40s    Chicken pox    Cholecystitis 11/2011   Did not require sgy - Dr. Staci Acosta - [facility]  (cholelithiasis)   H/O Clostridium difficile infection    IBS (irritable bowel syndrome)    MRSA exposure 2005   Spider bite   MVP (mitral valve prolapse)    Stable - Dr. Ubaldo Glassing   Rheumatic fever    Past Surgical History:  Procedure Laterality Date   CATARACT EXTRACTION  [id number]   MUSCLE BIOPSY     Patient Active Problem List   Diagnosis Date Noted   Joint ache 02/11/2022   Ankle pain 02/05/2022   Fever 12/26/2021   Abdominal pain 12/26/2021   Cough 09/07/2021   Right shoulder pain 07/15/2021   Hematoma 02/11/2021   Ankle swelling 11/26/2020   Low back pain 11/22/2020   Hip pain, right 11/22/2020   Light headedness 06/24/2020   Hearing loss 06/24/2020   Binocular vision disorder with diplopia 06/24/2020   Stress 05/17/2020   Chest pain 05/16/2020   Welcome to Medicare preventive visit 06/20/2019   Viral syndrome 01/30/2019   Itching 07/21/2017   Asthma 07/16/2016   Urinary incontinence 07/16/2016   SOB (shortness of breath) 04/01/2016   Bilateral shoulder pain 02/24/2016   Fatigue 01/28/2016   Neck fullness 01/28/2016   Routine general medical examination at a health care facility 07/25/2015   Health care maintenance 10/09/2014   Neck pain 11/26/2013   Hypercholesterolemia 11/26/2013   History of colonic polyps 05/11/2013   Hyperbilirubinemia 01/18/2013   GERD (gastroesophageal reflux disease) 12/03/2012   Cholelithiasis  11/07/2012   IBS (irritable bowel syndrome) 11/07/2012   History of rheumatic fever 08/28/2012   MVP (mitral valve prolapse) 08/28/2012    PCP: [facility]  REFERRING PROVIDER:  PCP   REFERRING DIAG: R32 (ICD-10-CM) - Urinary incontinence, unspecified type  Rationale for Evaluation and Treatment Rehabilitation  THERAPY DIAG:  Muscle weakness (generalized)  Other low back pain  Other lack of coordination  ONSET DATE:   SUBJECTIVE:  SUBJECTIVE STATEMENT: 1) Urinary incontinence : pt noticed she is not having much of strong leak when getting up of the bed this past week. Pt noticed she is not leaking when getting up until she is a few steps away from the bed compared to leaking right away with sit to stand off the bed   2) LBP:  mild pain ocassionally   3)   B ankle pain: Pt noticed swelling with the heel raises after last assession and she did not dont he exercises. Pt was trying to look for [employer] shoes but didn't find them,.    PERTINENT HISTORY:    PAIN:  Are you having pain?   PRECAUTIONS: None  WEIGHT BEARING RESTRICTIONS No  FALLS:  Has patient fallen in last 6 months? Yes  LIVING ENVIRONMENT: Lives with:  lives alone Lives in: House/apartment Stairs: Yes: External: 1 steps; none   OCCUPATION: retired   PLOF: Independent and Independent with basic ADLs  PATIENT GOALS                Not have accidents with leakage and to sleep more   OBJECTIVE:    [facility] PT Assessment       Observation/Other Assessments   Observations poor eccentric control of plantar arch, poor activation of transverse arch in seated position.             [facility] Adult PT Treatment/Exercise       Therapeutic Activites    Other Therapeutic Activities discussed anatomy/ physiology of  strengtheing feet. feet arches, gluts to help her with sit to stand which helps to minimize leakage , discussed buying shoe with more sole support as she wears shoes with minimial sole support      Neuro Re-ed    Neuro Re-ed Details  excessive cues verbally and tactile cues for new seated plantart arch and transverse arch strengthening with resistance band. cued for toe abduction , cued for eccentric control of plantar arch                   HOME EXERCISE PROGRAM: See pt instruction section    ASSESSMENT:  CLINICAL IMPRESSION:   Sit to stand is the task where she experiences leakage and also with feet pain. Pt reported good carry over with report of decreased leakage with getting out of bed and able to take a few more steps before leakage from the bed to toilet. Reviewed hip abduction strengthening. Withheld standing single heel raises due to pt's report of ankles getting swollen after trying them last week. Downgraded to seated plantar/ transverse arch strengthening/ eccentric control of plantar arch with resistance band and propioception training. Educated the importance of getting shoes with more sole support given her medial arch collapse/ genu valgus.  Anticipate lower kinetic chain strengthening will help pt with both sit to stand, decreased feet pain, and decreasing leakage/ urgency. Plan to progress with more strengthening and propioception training.  Pt benefits from skilled PT    OBJECTIVE IMPAIRMENTS Abnormal gait, decreased balance, decreased coordination, decreased endurance, decreased mobility, decreased ROM, decreased strength, hypomobility, increased edema, impaired perceived functional ability, increased muscle spasms, impaired flexibility, improper body mechanics, postural dysfunction, and pain.   ACTIVITY LIMITATIONS sitting, standing, bed mobility, continence, and toileting  PARTICIPATION LIMITATIONS: interpersonal relationship, community activity, and  occupation  PERSONAL FACTORS Age, Education, Fitness, Past/current experiences, and Social background are also affecting patient's functional outcome.   REHAB POTENTIAL: Good  CLINICAL DECISION MAKING: Evolving/moderate complexity  EVALUATION COMPLEXITY:  Moderate   PATIENT EDUCATION:   Education details: Showed pt anatomy images. Explained muscles attachments/ connection, physiology of deep core system/ spinal- thoracic-pelvis-lower kinetic chain as they relate to pt's presentation, Sx, and past Hx. Explained what and how these areas of deficits need to be restored to balance and function   Person educated: Patient Education method: Explanation, Demonstration, Tactile cues, Verbal cues, and Handouts Education comprehension: verbalized understanding, returned demonstration, verbal cues required, tactile cues required, and needs further education   PLAN: PT FREQUENCY: 1x/week  PT DURATION: 10 week  PLANNED INTERVENTIONS: Therapeutic exercises, Therapeutic activity, Neuromuscular re-education, Balance training, Gait training, Patient/Family education, Self Care, Joint mobilization, Dry Needling, Spinal mobilization, Cryotherapy, Moist heat, Taping, and Manual therapy.  PLAN FOR NEXT SESSION:  See clinical impression for plan     GOALS: Goals reviewed with patient? Yes  SHORT TERM GOALS: Target date: 04/02/2022  Pt will be IND with HEP Baseline: not IND Goal status: INITIAL   LONG TERM GOALS: Target date: 05/14/2022  Pt will report decreased pads during day to 1 pad and < 2 pads at night.  Baseline: two pads during the day and 4-5  per night. Nocturia 4-5 x per night.  Goal status: INITIAL  2.  Pt will be referred to sleep study to rule in/ out OSA due to high nocturia episodes and risk factors  Baseline: sleep study 3 years ago Goal status: INITIAL  3.  Pt will demo increased R LE to improve pelvic stability/ balance  Baseline:  RLE 4/5, L 5/5  Goal status:  INITIAL  4.  Pt will demo > 5 pt change on Lumbar FOTO to improve QOL and functional mobility   Baseline: 60 pts Goal status: INITIAL  5.  Pt will demo > 5 pt change on FOTO  to improve QOL and function  Urinary Problem baseline 37 pts PFDI Urinary  baseline  25 pts     Goal status: INITIAL  6.  Pt will demo increased R hip abd and R hip flex/knee flex/ ext to > 4/5 in order to promote pelvic stability and strength for walking  Baseline: RLE 4/5, L5/5 , hip abd R 3+/5 caused pain at the SIJ, L hip abd 4+/5  Goal status: INITIAL   Jerl Mina, PT 03/05/2022, 3:16 PM

## 2022-03-11 ENCOUNTER — Encounter: Payer: Self-pay | Admitting: Internal Medicine

## 2022-03-11 ENCOUNTER — Ambulatory Visit (INDEPENDENT_AMBULATORY_CARE_PROVIDER_SITE_OTHER): Payer: PPO | Admitting: Internal Medicine

## 2022-03-11 VITALS — BP 114/60 | HR 81 | Temp 97.4°F | Resp 17 | Ht 67.0 in | Wt 173.4 lb

## 2022-03-11 DIAGNOSIS — M25571 Pain in right ankle and joints of right foot: Secondary | ICD-10-CM

## 2022-03-11 DIAGNOSIS — M6281 Muscle weakness (generalized): Secondary | ICD-10-CM | POA: Diagnosis not present

## 2022-03-11 DIAGNOSIS — M25473 Effusion, unspecified ankle: Secondary | ICD-10-CM

## 2022-03-11 DIAGNOSIS — M25579 Pain in unspecified ankle and joints of unspecified foot: Secondary | ICD-10-CM | POA: Diagnosis not present

## 2022-03-11 DIAGNOSIS — R351 Nocturia: Secondary | ICD-10-CM | POA: Diagnosis not present

## 2022-03-11 DIAGNOSIS — R0683 Snoring: Secondary | ICD-10-CM | POA: Diagnosis not present

## 2022-03-11 DIAGNOSIS — Z860100 Personal history of colon polyps, unspecified: Secondary | ICD-10-CM

## 2022-03-11 DIAGNOSIS — M25471 Effusion, right ankle: Secondary | ICD-10-CM | POA: Diagnosis not present

## 2022-03-11 DIAGNOSIS — J4520 Mild intermittent asthma, uncomplicated: Secondary | ICD-10-CM

## 2022-03-11 DIAGNOSIS — E7800 Pure hypercholesterolemia, unspecified: Secondary | ICD-10-CM | POA: Diagnosis not present

## 2022-03-11 LAB — LIPID PANEL
Cholesterol: 159 mg/dL (ref 0–200)
HDL: 44.7 mg/dL (ref >39.00)
LDL Cholesterol: 96 mg/dL (ref 0–99)
NonHDL: 114.18
Total CHOL/HDL Ratio: 4
Triglycerides: 93 mg/dL (ref 0.0–149.0)
VLDL: 18.6 mg/dL (ref 0.0–40.0)

## 2022-03-11 LAB — HEPATIC FUNCTION PANEL
ALT: 17 U/L (ref 0–35)
AST: 20 U/L (ref 0–37)
Albumin: 4.2 g/dL (ref 3.5–5.2)
Alkaline Phosphatase: 48 U/L (ref 39–117)
Bilirubin, Direct: 0.2 mg/dL (ref 0.0–0.3)
Total Bilirubin: 1.2 mg/dL (ref 0.2–1.2)
Total Protein: 6.3 g/dL (ref 6.0–8.3)

## 2022-03-11 LAB — BASIC METABOLIC PANEL
BUN: 11 mg/dL (ref 6–23)
CO2: 23 mEq/L (ref 19–32)
Calcium: 9.1 mg/dL (ref 8.4–10.5)
Chloride: 106 mEq/L (ref 96–112)
Creatinine, Ser: 0.64 mg/dL (ref 0.40–1.20)
GFR: 90.12 mL/min (ref >60.00)
Glucose, Bld: 65 mg/dL — ABNORMAL LOW (ref 70–99)
Potassium: 4.2 mEq/L (ref 3.5–5.1)
Sodium: 142 mEq/L (ref 135–145)

## 2022-03-11 LAB — TSH: TSH: 1.76 u[IU]/mL (ref 0.35–5.50)

## 2022-03-11 LAB — SEDIMENTATION RATE: Sed Rate: 10 mm/hr (ref 0–30)

## 2022-03-11 LAB — CK: Total CK: 65 U/L (ref 7–177)

## 2022-03-11 NOTE — Progress Notes (Unsigned)
Patient ID: Lynn Mann, female   DOB: [birth year], 71 y.o.   MRN: [id number]   Subjective:    Patient ID: Lynn Mann, female    DOB: 1951-04-26, 71 y.o.   MRN: [id number]   Patient here for work in appt .   HPI Was worked in to discuss lasix.  On presentation, has several concerns.  She is concern regarding ankle swelling.  Feels may need lasix to help with swelling.  Did see Dr Vickki Muff recently.  S/p injection - ankle.  States helped swelling.  Also, she felt better after the injection - all over.  Reports ankle swelling is better in am.  Once up moving around, will notice increased swelling.  Discussed compression hose.  No chest pain reported.  Breathing appears to be stable.  She is having nocturia and have discussed possible sleep apnea.  She does snore.  Tries to sleep on her side.  Discussed again today.  She is agreeable for referral to pulmonary.  States she if has sleep study, wants in sleep lab.  She is now taking crestor daily.  Desires cholesterol check today.  Did see rheumatology.  Had xrays of hands and knees.  Has f/u scheduled to discuss w/up and treatment.  She does report over the last week, has noticed some increased weakness.  States she previously has had to use arms to get out of a chair, but feels weakness has worsened recently.  Notices when trying to roll over in bed - has a harder time picking up her hips.     Past Medical History:  Diagnosis Date   Arthritis    C. difficile diarrhea    age 71s-40s    Chicken pox    Cholecystitis 11/2011   Did not require sgy - Dr. Staci Acosta - [facility]  (cholelithiasis)   H/O Clostridium difficile infection    IBS (irritable bowel syndrome)    MRSA exposure 2005   Spider bite   MVP (mitral valve prolapse)    Stable - Dr. Ubaldo Glassing   Rheumatic fever    Past Surgical History:  Procedure Laterality Date   CATARACT EXTRACTION  [id number]   MUSCLE BIOPSY     Family History  Problem Relation Age of Onset   Arthritis Mother     Stroke Mother    Diabetes Mother    Hypertension Mother    Arthritis Father    Stroke Father    Diabetes Paternal Grandmother    Cancer Paternal Uncle        colon   Heart disease Other        maternal and paternal side   Breast cancer Paternal 13    Breast cancer Cousin    Breast cancer Cousin        female cousin   Breast cancer Cousin    Social History   Socioeconomic History   Marital status: Widowed    Spouse name: Not on file   Number of children: Not on file   Years of education: 16   Highest education level: Not on file  Occupational History   Occupation: Retired  Tobacco Use   Smoking status: Never   Smokeless tobacco: Never  Vaping Use   Vaping Use: Never used  Substance and Sexual Activity   Alcohol use: Not Currently    Comment: Rarely   Drug use: No   Sexual activity: Yes    Partners: Male    Birth control/protection: Post-menopausal  Other Topics Concern  Not on file  Social History Narrative   Widowed    2 kids son and daughter   Social Determinants of Health   Financial Resource Strain: Low Risk  (06/27/2021)   Overall Financial Resource Strain (CARDIA)    Difficulty of Paying Living Expenses: Not hard at all  Food Insecurity: No Food Insecurity (06/27/2021)   Hunger Vital Sign    Worried About Running Out of Food in the Last Year: Never true    Ran Out of Food in the Last Year: Never true  Transportation Needs: No Transportation Needs (06/27/2021)   PRAPARE - Hydrologist (Medical): No    Lack of Transportation (Non-Medical): No  Physical Activity: Insufficiently Active (06/27/2021)   Exercise Vital Sign    Days of Exercise per Week: 2 days    Minutes of Exercise per Session: 50 min  Stress: No Stress Concern Present (06/27/2021)   [place]    Feeling of Stress : Not at all  Social Connections: Unknown (06/27/2021)   Social Connection and  Isolation Panel [NHANES]    Frequency of Communication with Friends and Family: More than three times a week    Frequency of Social Gatherings with Friends and Family: More than three times a week    Attends Religious Services: Not on file    Active Member of Clubs or Organizations: Not on file    Attends Archivist Meetings: Not on file    Marital Status: Widowed     Review of Systems  Constitutional:  Negative for appetite change and unexpected weight change.  HENT:  Negative for congestion and sinus pressure.   Respiratory:  Negative for cough and chest tightness.        Breathing stable.   Cardiovascular:  Negative for chest pain and palpitations.       Ankle swelling.   Gastrointestinal:  Negative for abdominal pain, diarrhea, nausea and vomiting.  Genitourinary:  Negative for difficulty urinating and dysuria.  Musculoskeletal:  Negative for myalgias.       Seeing rheumatology for joint pain.   Skin:  Negative for color change and rash.  Neurological:  Negative for dizziness, light-headedness and headaches.  Psychiatric/Behavioral:  Negative for agitation and dysphoric mood.        Objective:     BP 114/60 (BP Location: Left Arm, Patient Position: Sitting, Cuff Size: Large)   Pulse 81   Temp (!) 97.4 F (36.3 C) (Temporal)   Resp 17   Ht '5\' 7"'  (1.702 m)   Wt 173 lb 6.4 oz (78.7 kg)   SpO2 98%   BMI 27.16 kg/m  Wt Readings from Last 3 Encounters:  03/11/22 173 lb 6.4 oz (78.7 kg)  02/05/22 177 lb 6.4 oz (80.5 kg)  12/26/21 176 lb 1.6 oz (79.9 kg)    Physical Exam Vitals reviewed.  Constitutional:      General: She is not in acute distress.    Appearance: Normal appearance.  HENT:     Head: Normocephalic and atraumatic.     Right Ear: External ear normal.     Left Ear: External ear normal.  Eyes:     General: No scleral icterus.       Right eye: No discharge.        Left eye: No discharge.     Conjunctiva/sclera: Conjunctivae normal.  Neck:      Thyroid: No thyromegaly.  Cardiovascular:  Rate and Rhythm: Normal rate and regular rhythm.  Pulmonary:     Effort: No respiratory distress.     Breath sounds: Normal breath sounds. No wheezing.  Abdominal:     General: Bowel sounds are normal.     Palpations: Abdomen is soft.     Tenderness: There is no abdominal tenderness.  Musculoskeletal:        General: No tenderness.     Cervical back: Neck supple. No tenderness.     Comments: No increased pedal or ankle edema on exam.  Motor strength appears to be equal bilateral lower extremities.   Lymphadenopathy:     Cervical: No cervical adenopathy.  Skin:    Findings: No erythema or rash.  Neurological:     Mental Status: She is alert.  Psychiatric:        Mood and Affect: Mood normal.        Behavior: Behavior normal.      Outpatient Encounter Medications as of 03/11/2022  Medication Sig   aspirin 325 MG tablet Take by mouth.   b complex vitamins capsule Take 1 capsule by mouth daily.   CALCIUM PO Take by mouth.   Cholecalciferol (VITAMIN D3 PO) Take by mouth.   cyclobenzaprine (FLEXERIL) 5 MG tablet Take 1 tablet (5 mg total) by mouth 3 (three) times daily as needed.   fluticasone (FLONASE) 50 MCG/ACT nasal spray Place 2 sprays into both nostrils daily. prn   meclizine (ANTIVERT) 25 MG tablet Take 0.5-1 tablets (12.5-25 mg total) by mouth 2 (two) times daily as needed for dizziness.   Multiple Vitamins-Minerals (PRESERVISION AREDS 2 PO) Take by mouth.   rosuvastatin (CRESTOR) 5 MG tablet Take 1 tablet (5 mg total) by mouth 3 (three) times a week. Mon, Wed, Fri (Patient taking differently: Take 5 mg by mouth daily.)   No facility-administered encounter medications on file as of 03/11/2022.     Lab Results  Component Value Date   WBC 6.1 12/26/2021   HGB 13.3 12/26/2021   HCT 40.8 12/26/2021   PLT 305.0 12/26/2021   GLUCOSE 65 (L) 03/11/2022   CHOL 159 03/11/2022   TRIG 93.0 03/11/2022   HDL 44.70 03/11/2022    LDLDIRECT 110.0 02/11/2017   LDLCALC 96 03/11/2022   ALT 17 03/11/2022   AST 20 03/11/2022   NA 142 03/11/2022   K 4.2 03/11/2022   CL 106 03/11/2022   CREATININE 0.64 03/11/2022   BUN 11 03/11/2022   CO2 23 03/11/2022   TSH 1.76 03/11/2022   HGBA1C 5.4 07/15/2017    MR ANGIO HEAD WO CONTRAST  Result Date: 01/08/2022 CLINICAL DATA:  Provided history: Diplopia. Additional history provided by scanning technologist: Vertigo. EXAM: MRA HEAD WITHOUT CONTRAST TECHNIQUE: Angiographic images of the Circle of Willis were acquired using MRA technique without intravenous contrast. COMPARISON:  Brain MRI 11/11/2021.  Head CT 12/26/2015. FINDINGS: Anterior circulation: The intracranial internal carotid arteries are patent. The M1 middle cerebral arteries are patent. No M2 proximal branch occlusion or high-grade proximal stenosis. The anterior cerebral arteries are patent. A small (1 mm) aneurysm is questioned in the region of the anterior communicating artery and left A1/A2 junction (series 5, images 143 and 144). Posterior circulation: The intracranial vertebral arteries are patent. The basilar artery is patent. The posterior cerebral arteries are patent. A right posterior communicating artery is present. The left posterior communicating artery is diminutive or absent. Anatomic variants: As described. IMPRESSION: 1. No intracranial large vessel occlusion or proximal high-grade arterial stenosis. 2.  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 A CTA of the head may be helpful for further evaluation. Electronically Signed   By: Kellie Simmering D.O.   On: 01/08/2022 08:58       Assessment & Plan:   Problem List Items Addressed This Visit     Ankle pain    Saw Dr Vickki Muff.  S/p injection.  Helped. Compression hose for swelling.  No increased swelling noted on exam today.  Discussed lasix. Does not appear to be volume overloaded today.  Lungs clear.  Weight is  down.  Follow.       Ankle swelling    As above.  No increased swelling today.  No evidence of volume overload on exam.  Swelling better in am.  Compression hose.  Hold on lasix.       Asthma    Breathing overall appears to be stable.  No sob noted.  Lungs clear.        History of colonic polyps    Colonoscopy 08/2017 - tubular adenoma.  Recommended f/u colonoscopy in 5 years.       Hypercholesterolemia    On crestor.  Low cholesterol diet and exercise.  Follow lipid panel and liver function tests.       Relevant Orders   TSH (Completed)   Muscle weakness    Reported noticing some increased weakness - pelvic muscles/leg muscles as outlined.  Will check esr and ck.  Motor strength on exam - equal bilateral.  Going to PT.  Follow.       Relevant Orders   CK (Creatine Kinase) (Completed)   Sedimentation rate (Completed)   Nocturia    Nocturia, snoring - discussed sleep apnea.  Agreeable as outlined for referral to pulmonary.  States if has to have sleep study, prefers sleep lab.       Relevant Orders   Ambulatory referral to Pulmonology   Snoring    Nocturia, snoring - discussed sleep apnea.  Agreeable as outlined for referral to pulmonary.  States if has to have sleep study, prefers sleep lab.       Relevant Orders   Ambulatory referral to Pulmonology   Other Visit Diagnoses     Pain and swelling of right ankle    -  Primary   Relevant Orders   Compression stockings       I spent 45 minutes with the patient. Time spent discussing her current concerns and symptoms.  Specifically time spent discussing her concern regarding increased swelling, possible sleep apnea, weakness. Time also spent discussing further w/up, evaluation and treatment.    Einar Pheasant, MD

## 2022-03-12 ENCOUNTER — Ambulatory Visit: Payer: PPO | Admitting: Physical Therapy

## 2022-03-12 ENCOUNTER — Encounter: Payer: Self-pay | Admitting: Internal Medicine

## 2022-03-12 DIAGNOSIS — R351 Nocturia: Secondary | ICD-10-CM | POA: Insufficient documentation

## 2022-03-12 DIAGNOSIS — G4730 Sleep apnea, unspecified: Secondary | ICD-10-CM | POA: Insufficient documentation

## 2022-03-12 DIAGNOSIS — R0683 Snoring: Secondary | ICD-10-CM | POA: Insufficient documentation

## 2022-03-12 NOTE — Assessment & Plan Note (Signed)
Nocturia, snoring - discussed sleep apnea.  Agreeable as outlined for referral to pulmonary.  States if has to have sleep study, prefers sleep lab.

## 2022-03-12 NOTE — Assessment & Plan Note (Signed)
Breathing overall appears to be stable.  No sob noted.  Lungs clear.

## 2022-03-12 NOTE — Assessment & Plan Note (Signed)
Colonoscopy 08/2017 - tubular adenoma.  Recommended f/u colonoscopy in 5 years.  

## 2022-03-12 NOTE — Assessment & Plan Note (Signed)
Reported noticing some increased weakness - pelvic muscles/leg muscles as outlined.  Will check esr and ck.  Motor strength on exam - equal bilateral.  Going to PT.  Follow.

## 2022-03-12 NOTE — Assessment & Plan Note (Signed)
As above.  No increased swelling today.  No evidence of volume overload on exam.  Swelling better in am.  Compression hose.  Hold on lasix.

## 2022-03-12 NOTE — Assessment & Plan Note (Signed)
On crestor.  Low cholesterol diet and exercise.  Follow lipid panel and liver function tests.   

## 2022-03-12 NOTE — Assessment & Plan Note (Signed)
Saw Dr Vickki Muff.  S/p injection.  Helped. Compression hose for swelling.  No increased swelling noted on exam today.  Discussed lasix. Does not appear to be volume overloaded today.  Lungs clear.  Weight is down.  Follow.

## 2022-03-13 NOTE — Telephone Encounter (Signed)
Patient requesting a refill on her rosuvastatin (CRESTOR) 5 MG tablet. However she is unsure if she should continue to take it at he regimen that Dr. Nicki Reaper assigned or should it change. She has been out of her medication for 2 days.

## 2022-03-14 ENCOUNTER — Other Ambulatory Visit: Payer: Self-pay | Admitting: Internal Medicine

## 2022-03-14 ENCOUNTER — Other Ambulatory Visit: Payer: Self-pay

## 2022-03-14 MED ORDER — ROSUVASTATIN CALCIUM 5 MG PO TABS: 5.0000 mg | ORAL_TABLET | Freq: Every day | ORAL | 2 refills | Status: DC

## 2022-03-14 NOTE — Telephone Encounter (Signed)
I have sent Crestor 5 mg to patient's pharmacy.

## 2022-03-14 NOTE — Telephone Encounter (Signed)
Pt called stating she need the pink '5mg'$  cholesterol pill.

## 2022-03-18 ENCOUNTER — Telehealth: Payer: Self-pay | Admitting: Internal Medicine

## 2022-03-18 DIAGNOSIS — R42 Dizziness and giddiness: Secondary | ICD-10-CM

## 2022-03-18 NOTE — Telephone Encounter (Signed)
The PT vestibular rehab needs to be placed as a referral to PT. Please advise and Thank you!

## 2022-03-18 NOTE — Telephone Encounter (Signed)
error 

## 2022-03-19 ENCOUNTER — Ambulatory Visit: Payer: PPO | Admitting: Physical Therapy

## 2022-03-19 VITALS — BP 118/84

## 2022-03-19 DIAGNOSIS — M5459 Other low back pain: Secondary | ICD-10-CM

## 2022-03-19 DIAGNOSIS — R278 Other lack of coordination: Secondary | ICD-10-CM

## 2022-03-19 DIAGNOSIS — M25551 Pain in right hip: Secondary | ICD-10-CM

## 2022-03-19 DIAGNOSIS — M6281 Muscle weakness (generalized): Secondary | ICD-10-CM

## 2022-03-19 DIAGNOSIS — G8929 Other chronic pain: Secondary | ICD-10-CM

## 2022-03-19 NOTE — Therapy (Signed)
OUTPATIENT PHYSICAL THERAPY Treatment DEFERRED    Patient Name: Lynn Mann MRN: [id number] DOB:28-Aug-1950, 71 y.o., female Today's Date: 03/19/2022   PT End of Session - 03/19/22 1431     Visit Number -   Number of Visits 10    Date for PT Re-Evaluation 04/23/22    PT Start Time 1421    PT Stop Time 1440    PT Time Calculation (min) 19 min             Past Medical History:  Diagnosis Date   Arthritis    C. difficile diarrhea    age 52s-40s    Chicken pox    Cholecystitis 11/2011   Did not require sgy - Dr. Staci Acosta - [facility]  (cholelithiasis)   H/O Clostridium difficile infection    IBS (irritable bowel syndrome)    MRSA exposure 2005   Spider bite   MVP (mitral valve prolapse)    Stable - Dr. Ubaldo Glassing   Rheumatic fever    Past Surgical History:  Procedure Laterality Date   CATARACT EXTRACTION  [id number]   MUSCLE BIOPSY     Patient Active Problem List   Diagnosis Date Noted   Nocturia 03/12/2022   Snoring 03/12/2022   Muscle weakness 03/11/2022   Joint ache 02/11/2022   Ankle pain 02/05/2022   Fever 12/26/2021   Abdominal pain 12/26/2021   Cough 09/07/2021   Right shoulder pain 07/15/2021   Hematoma 02/11/2021   Ankle swelling 11/26/2020   Low back pain 11/22/2020   Hip pain, right 11/22/2020   Light headedness 06/24/2020   Hearing loss 06/24/2020   Binocular vision disorder with diplopia 06/24/2020   Stress 05/17/2020   Chest pain 05/16/2020   Welcome to Medicare preventive visit 06/20/2019   Viral syndrome 01/30/2019   Itching 07/21/2017   Asthma 07/16/2016   Urinary incontinence 07/16/2016   SOB (shortness of breath) 04/01/2016   Bilateral shoulder pain 02/24/2016   Fatigue 01/28/2016   Neck fullness 01/28/2016   Routine general medical examination at a health care facility 07/25/2015   Health care maintenance 10/09/2014   Neck pain 11/26/2013   Hypercholesterolemia 11/26/2013   History of colonic polyps 05/11/2013   Hyperbilirubinemia  01/18/2013   GERD (gastroesophageal reflux disease) 12/03/2012   Cholelithiasis 11/07/2012   IBS (irritable bowel syndrome) 11/07/2012   History of rheumatic fever 08/28/2012   MVP (mitral valve prolapse) 08/28/2012    PCP: [facility]  REFERRING PROVIDER:  PCP   REFERRING DIAG: R32 (ICD-10-CM) - Urinary incontinence, unspecified type  Rationale for Evaluation and Treatment Rehabilitation  THERAPY DIAG:  Muscle weakness (generalized)  Other lack of coordination  Other low back pain  Chronic right shoulder pain  Pain in right hip  ONSET DATE:   SUBJECTIVE:  SUBJECTIVE STATEMENT: 1) Urinary incontinence : it is not as bad as it was 4 weeks ago.   2) LBP:  mild pain ocassionally   3)   B ankle pain: no bad and sometimes just feel ache at the top of the B feet and the bottom of the foot by the ankles .    Pt feels more spacey and disoriented but not vertigo dizzy but more dysequilibrium.  Pt has a L eye that is not focusing well and she is seeing a neurologist for that and she does sometimes see double because of the eye. But today it felt different and she felt tired and out of breath.     PERTINENT HISTORY:    PAIN:  Are you having pain?   PRECAUTIONS: None  WEIGHT BEARING RESTRICTIONS No  FALLS:  Has patient fallen in last 6 months? Yes  LIVING ENVIRONMENT: Lives with:  lives alone Lives in: House/apartment Stairs: Yes: External: 1 steps; none   OCCUPATION: retired   PLOF: Independent and Independent with basic ADLs  PATIENT GOALS                Not have accidents with leakage and to sleep more   OBJECTIVE:   Vitals:   03/19/22 1432  BP: 118/84   Pulse 102  R UE  ______    Vitals:   03/19/22 1438  BP: 124/77      Pulse 102  L  UE  _________________________________________________      HOME EXERCISE PROGRAM: See pt instruction section    ASSESSMENT:  CLINICAL IMPRESSION:   Deferred PT Tx today. Pt reported she is feeling more spacey and disoriented today but not vertigo dizzy but more dysequilibrium.  Pt has a L eye that is not focusing well and she is seeing a neurologist for that and she does sometimes see double because of the eye. But today it felt different and she felt tired and out of breath.   Monitored pt's BP and HR. BP was normal but HR remained high at 102 despite seated rest for 20 min.   Called pt's cardiologist office to report her Sx. Pt spoke directly with staff there and pt will be calling back to schedule an appt with them.   Plan to review deep core exercises for pelvic floor issues at next session and then d/c so pt can focus on her medical issues.    OBJECTIVE IMPAIRMENTS Abnormal gait, decreased balance, decreased coordination, decreased endurance, decreased mobility, decreased ROM, decreased strength, hypomobility, increased edema, impaired perceived functional ability, increased muscle spasms, impaired flexibility, improper body mechanics, postural dysfunction, and pain.   ACTIVITY LIMITATIONS sitting, standing, bed mobility, continence, and toileting  PARTICIPATION LIMITATIONS: interpersonal relationship, community activity, and occupation  PERSONAL FACTORS Age, Education, Fitness, Past/current experiences, and Social background are also affecting patient's functional outcome.   REHAB POTENTIAL: Good  CLINICAL DECISION MAKING: Evolving/moderate complexity  EVALUATION COMPLEXITY: Moderate   PATIENT EDUCATION:   Education details: Showed pt anatomy images. Explained muscles attachments/ connection, physiology of deep core system/ spinal- thoracic-pelvis-lower kinetic chain as they relate to pt's presentation, Sx, and past Hx. Explained what and how these areas of deficits  need to be restored to balance and function   Person educated: Patient Education method: Explanation, Demonstration, Tactile cues, Verbal cues, and Handouts Education comprehension: verbalized understanding, returned demonstration, verbal cues required, tactile cues required, and needs further education   PLAN: PT FREQUENCY: 1x/week  PT DURATION: 10 week  PLANNED INTERVENTIONS:  Therapeutic exercises, Therapeutic activity, Neuromuscular re-education, Balance training, Gait training, Patient/Family education, Self Care, Joint mobilization, Dry Needling, Spinal mobilization, Cryotherapy, Moist heat, Taping, and Manual therapy.  PLAN FOR NEXT SESSION:  See clinical impression for plan     GOALS: Goals reviewed with patient? Yes  SHORT TERM GOALS: Target date: 04/16/2022  Pt will be IND with HEP Baseline: not IND Goal status: INITIAL   LONG TERM GOALS: Target date: 05/28/2022  Pt will report decreased pads during day to 1 pad and < 2 pads at night.  Baseline: two pads during the day and 4-5  per night. Nocturia 4-5 x per night.  Goal status: INITIAL  2.  Pt will be referred to sleep study to rule in/ out OSA due to high nocturia episodes and risk factors  Baseline: sleep study 3 years ago Goal status: INITIAL  3.  Pt will demo increased R LE to improve pelvic stability/ balance  Baseline:  RLE 4/5, L 5/5  Goal status: INITIAL  4.  Pt will demo > 5 pt change on Lumbar FOTO to improve QOL and functional mobility   Baseline: 60 pts Goal status: INITIAL  5.  Pt will demo > 5 pt change on FOTO  to improve QOL and function  Urinary Problem baseline 37 pts PFDI Urinary  baseline  25 pts     Goal status: INITIAL  6.  Pt will demo increased R hip abd and R hip flex/knee flex/ ext to > 4/5 in order to promote pelvic stability and strength for walking  Baseline: RLE 4/5, L5/5 , hip abd R 3+/5 caused pain at the SIJ, L hip abd 4+/5  Goal status: INITIAL   Jerl Mina,  PT 03/19/2022, 2:32 PM

## 2022-03-19 NOTE — Telephone Encounter (Signed)
Is she requesting a referral to vestibular rehab.  She is currently being seen by PT at [facility]

## 2022-03-20 ENCOUNTER — Other Ambulatory Visit: Payer: Self-pay | Admitting: Internal Medicine

## 2022-03-20 DIAGNOSIS — R42 Dizziness and giddiness: Secondary | ICD-10-CM

## 2022-03-20 NOTE — Progress Notes (Signed)
Order placed for vestibular rehab.

## 2022-03-20 NOTE — Telephone Encounter (Signed)
I have messaged PT to clarify.  I have not placed an order to date.  I am trying to clarify what is needed.  Thanks.

## 2022-03-20 NOTE — Progress Notes (Signed)
Order placed for vestibular rehab

## 2022-03-20 NOTE — Telephone Encounter (Signed)
Patient states she is checking to see if we have a referral yet for her to physical therapy.

## 2022-03-20 NOTE — Telephone Encounter (Signed)
Order placed for referral to vestibular therapy

## 2022-03-20 NOTE — Telephone Encounter (Signed)
Pt advised referral for Vestibular rehab is being worked on. Will wait to hear.

## 2022-03-22 NOTE — Telephone Encounter (Signed)
I am not sure that I understand the difference.  I placed an order for the referral - just like I do any other PT referral (or any other referral).  Let me know if there is something different I need to do.  Thank you

## 2022-03-22 NOTE — Addendum Note (Signed)
Addended by: Alisa Graff on: 03/22/2022 03:56 AM   Modules accepted: Orders

## 2022-03-26 ENCOUNTER — Encounter: Payer: PPO | Admitting: Physical Therapy

## 2022-03-28 ENCOUNTER — Encounter: Payer: PPO | Admitting: Physical Therapy

## 2022-04-02 DIAGNOSIS — R079 Chest pain, unspecified: Secondary | ICD-10-CM | POA: Diagnosis not present

## 2022-04-02 DIAGNOSIS — R0602 Shortness of breath: Secondary | ICD-10-CM | POA: Diagnosis not present

## 2022-04-02 DIAGNOSIS — R32 Unspecified urinary incontinence: Secondary | ICD-10-CM | POA: Diagnosis not present

## 2022-04-02 DIAGNOSIS — R768 Other specified abnormal immunological findings in serum: Secondary | ICD-10-CM | POA: Diagnosis not present

## 2022-04-02 DIAGNOSIS — E7800 Pure hypercholesterolemia, unspecified: Secondary | ICD-10-CM | POA: Diagnosis not present

## 2022-04-02 DIAGNOSIS — E559 Vitamin D deficiency, unspecified: Secondary | ICD-10-CM | POA: Diagnosis not present

## 2022-04-02 DIAGNOSIS — I341 Nonrheumatic mitral (valve) prolapse: Secondary | ICD-10-CM | POA: Diagnosis not present

## 2022-04-02 DIAGNOSIS — H532 Diplopia: Secondary | ICD-10-CM | POA: Diagnosis not present

## 2022-04-02 DIAGNOSIS — F439 Reaction to severe stress, unspecified: Secondary | ICD-10-CM | POA: Diagnosis not present

## 2022-04-02 DIAGNOSIS — M25473 Effusion, unspecified ankle: Secondary | ICD-10-CM | POA: Diagnosis not present

## 2022-04-05 ENCOUNTER — Ambulatory Visit: Payer: PPO | Attending: Internal Medicine | Admitting: Physical Therapy

## 2022-04-05 DIAGNOSIS — M6281 Muscle weakness (generalized): Secondary | ICD-10-CM | POA: Insufficient documentation

## 2022-04-05 DIAGNOSIS — M25551 Pain in right hip: Secondary | ICD-10-CM | POA: Diagnosis not present

## 2022-04-05 DIAGNOSIS — M5459 Other low back pain: Secondary | ICD-10-CM | POA: Diagnosis not present

## 2022-04-05 DIAGNOSIS — R42 Dizziness and giddiness: Secondary | ICD-10-CM | POA: Diagnosis not present

## 2022-04-05 DIAGNOSIS — G8929 Other chronic pain: Secondary | ICD-10-CM | POA: Insufficient documentation

## 2022-04-05 DIAGNOSIS — M25511 Pain in right shoulder: Secondary | ICD-10-CM | POA: Insufficient documentation

## 2022-04-05 DIAGNOSIS — R278 Other lack of coordination: Secondary | ICD-10-CM | POA: Insufficient documentation

## 2022-04-05 NOTE — Therapy (Addendum)
OUTPATIENT PHYSICAL THERAPY Treatment  / Discharge Summary across 5 visits    Patient Name: Lynn Mann MRN: [id number] DOB:12/21/1950, 71 y.o., female Today's Date: 04/05/2022   PT End of Session - 04/05/22 1117     Visit Number 5    Number of Visits 10    Date for PT Re-Evaluation 04/23/22    PT Start Time 1106    PT Stop Time 1145    PT Time Calculation (min) 39 min             Past Medical History:  Diagnosis Date   Arthritis    C. difficile diarrhea    age 30s-40s    Chicken pox    Cholecystitis 11/2011   Did not require sgy - Dr. S. Lagoo - [facility]  (cholelithiasis)   H/O Clostridium difficile infection    IBS (irritable bowel syndrome)    MRSA exposure 2005   Spider bite   MVP (mitral valve prolapse)    Stable - Dr. Fath   Rheumatic fever    Past Surgical History:  Procedure Laterality Date   CATARACT EXTRACTION  [id number]   MUSCLE BIOPSY     Patient Active Problem List   Diagnosis Date Noted   Nocturia 03/12/2022   Snoring 03/12/2022   Muscle weakness 03/11/2022   Joint ache 02/11/2022   Ankle pain 02/05/2022   Fever 12/26/2021   Abdominal pain 12/26/2021   Cough 09/07/2021   Right shoulder pain 07/15/2021   Hematoma 02/11/2021   Ankle swelling 11/26/2020   Low back pain 11/22/2020   Hip pain, right 11/22/2020   Light headedness 06/24/2020   Hearing loss 06/24/2020   Binocular vision disorder with diplopia 06/24/2020   Stress 05/17/2020   Chest pain 05/16/2020   Welcome to Medicare preventive visit 06/20/2019   Viral syndrome 01/30/2019   Itching 07/21/2017   Asthma 07/16/2016   Urinary incontinence 07/16/2016   SOB (shortness of breath) 04/01/2016   Bilateral shoulder pain 02/24/2016   Fatigue 01/28/2016   Neck fullness 01/28/2016   Routine general medical examination at a health care facility 07/25/2015   Health care maintenance 10/09/2014   Neck pain 11/26/2013   Hypercholesterolemia 11/26/2013   History of colonic polyps  05/11/2013   Hyperbilirubinemia 01/18/2013   GERD (gastroesophageal reflux disease) 12/03/2012   Cholelithiasis 11/07/2012   IBS (irritable bowel syndrome) 11/07/2012   History of rheumatic fever 08/28/2012   MVP (mitral valve prolapse) 08/28/2012    PCP: Scott, CharleneMD  REFERRING PROVIDER:  PCP   REFERRING DIAG: R32 (ICD-10-CM) - Urinary incontinence, unspecified type  Rationale for Evaluation and Treatment Rehabilitation  THERAPY DIAG:  Muscle weakness (generalized)  Other lack of coordination  Other low back pain  Chronic right shoulder pain  Pain in right hip  ONSET DATE:   SUBJECTIVE:                                                                                                                                                                                             SUBJECTIVE STATEMENT: 1) Urinary incontinence : The last two days, she had leakage before getting out of bed.   2) LBP: mild pain ocassionally . Pt is up to 18 reps for clam shells HEP.   3)  B ankle pain:  It is a little better . It is not exactly pain. It is a tightness now. There is still some numbness across ankle. Pt went to [employer] and bought two new pairs of tennis shoes with arch support.    Pt notices her dysequilibrium when walking and images are moving when she looks at them. Pt has seen her neurologist about her double vision. Pt went to see her cardiologist and cleared for PT.  Pt has vestibular rehab scheduled at [facility] on 04/25/22.   PERTINENT HISTORY:    PAIN:  Are you having pain?   PRECAUTIONS: None  WEIGHT BEARING RESTRICTIONS No  FALLS:  Has patient fallen in last 6 months? Yes  LIVING ENVIRONMENT: Lives with:  lives alone Lives in: House/apartment Stairs: Yes: External: 1 steps; none   OCCUPATION: retired   PLOF: Independent and Independent with basic ADLs  PATIENT GOALS                Not have accidents with leakage and to sleep more   OBJECTIVE:    [facility] PT Assessment - 04/05/22 1126       Strength   Overall Strength Comments R hip flex 3/5, knee flex/ext 4/5,  R hip abduction 4-/5, L 3/5    Pelvic floor hooklying: 4 quick contractions          [facility] Adult PT Treatment/Exercise - 04/05/22 1129       Therapeutic Activites    Other Therapeutic Activities assessed her new pairs of tennis shoes, administered FOTO      Neuro Re-ed    Neuro Re-ed Details  cued for pelvic floor contraction, less overuse of gluts,                HOME EXERCISE PROGRAM: See pt instruction section    ASSESSMENT:  CLINICAL IMPRESSION:  Pt has achieved 4/6 goals and  partially met 2 goals. Pt has improved FOTO scores for lumbar and pelvic floor issues but will continue to still require more strengthening for pelvic floor mm and lower kinetic chain. Pt has decreased use of Depends briefs and had improvement with continence in the morning with sit to stand and mobility from bed to toilet. Pt's feet issues, hip abduction weakness played a role in her sit-to-stand.  Pt progressed to quick pelvic floor contractions to continue helping with continence. Pt was recommended to buy tennis shoes with better sole support. Reviewed her new pairs of shoes which she bought from [employer]. Anticipate pt will be able to return to a walking routine with these issues getting addressed.   Interdisciplinary update:   Pt notices her dysequilibrium when walking and images are moving when she looks at them. Pt has seen her neurologist about her double vision. Pt went to see her cardiologist and cleared for PT.  Pt has vestibular rehab scheduled at [facility] on 04/25/22. Pt was recommended to  work with her previous vestibular rehab PT , Dr. Roxana Hires at [facility].  He has helped pt with vestibular and orthopedic issues.  He will also be able to have quick access to her medical records to have a thorough understanding of the other potential medical factors  impacting her dysequilibrium.  Discharging  pt at this time. Prioritizing medical workup for her dysequilibrium and vision issues. Concerned about pt's fall risks at this time since pt would like to return to walking routine. Pt is IND with pelvic HEP and can return back to Pelvic PT at a later time.    OBJECTIVE IMPAIRMENTS Abnormal gait, decreased balance, decreased coordination, decreased endurance, decreased mobility, decreased ROM, decreased strength, hypomobility, increased edema, impaired perceived functional ability, increased muscle spasms, impaired flexibility, improper body mechanics, postural dysfunction, and pain.   ACTIVITY LIMITATIONS sitting, standing, bed mobility, continence, and toileting  PARTICIPATION LIMITATIONS: interpersonal relationship, community activity, and occupation  PERSONAL FACTORS Age, Education, Fitness, Past/current experiences, and Social background are also affecting patient's functional outcome.   REHAB POTENTIAL: Good  CLINICAL DECISION MAKING: Evolving/moderate complexity  EVALUATION COMPLEXITY: Moderate   PATIENT EDUCATION:   Education details: Showed pt anatomy images. Explained muscles attachments/ connection, physiology of deep core system/ spinal- thoracic-pelvis-lower kinetic chain as they relate to pt's presentation, Sx, and past Hx. Explained what and how these areas of deficits need to be restored to balance and function   Person educated: Patient Education method: Explanation, Demonstration, Tactile cues, Verbal cues, and Handouts Education comprehension: verbalized understanding, returned demonstration, verbal cues required, tactile cues required, and needs further education   PLAN: PT FREQUENCY: 1x/week  PT DURATION: 10 week  PLANNED INTERVENTIONS: Therapeutic exercises, Therapeutic activity, Neuromuscular re-education, Balance training, Gait training, Patient/Family education, Self Care, Joint mobilization, Dry Needling, Spinal  mobilization, Cryotherapy, Moist heat, Taping, and Manual therapy.  PLAN FOR NEXT SESSION:  See clinical impression for plan     GOALS: Goals reviewed with patient? Yes  SHORT TERM GOALS: Target date: 05/03/2022  Pt will be IND with HEP Baseline: not IND Goal status: Achieved   LONG TERM GOALS: Target date: 06/14/2022  Pt will report decreased pads during day to 1 pad and < 2 pads at night.  Baseline: two pads during the day and 4-5  per night. Nocturia 4-5 x per night.   ( 04/05/22: sometimes nocturia 3 x night,  pt is wearing 1-2 pads ( Depends) during the day)  Goal status: Achieved  2.  Pt will be referred to sleep study to rule in/ out OSA due to high nocturia episodes and risk factors  Baseline: sleep study 3 years ago Goal status: Achieved ( scheduled for pulmonologist )   3.  Pt will demo increased R LE to improve pelvic stability/ balance  Baseline:  RLE 4/5, L 5/5  Goal status: partially met   4.  Pt will demo > 5 pt change on Lumbar FOTO to improve QOL and functional mobility   Baseline: 60 pts  Goal status:  73 pts Achieved    5.  Pt will demo > 5 pt change on FOTO  to improve QOL and function  Urinary Problem baseline 37 pts  --> 41 pts PFDI Urinary  baseline  25 pts   --> 17 pts    Goal status:Achieved   6.  Pt will demo increased R hip abd and R hip flex/knee flex/ ext to > 4/5 in order to promote pelvic stability and strength for walking  Baseline: RLE 4/5, L5/5 , hip abd R 3+/5 caused pain at the SIJ, L hip abd 4+/5   Goal status:  partially met   R hip flex 3/5, knee flex/ext 4/5,  R hip abduction 4-/5, L 3/5    Yeung,Shin Yiing, PT 04/05/2022, 11:17 AM  

## 2022-04-06 ENCOUNTER — Other Ambulatory Visit: Payer: Self-pay | Admitting: Internal Medicine

## 2022-04-06 DIAGNOSIS — R42 Dizziness and giddiness: Secondary | ICD-10-CM

## 2022-04-06 NOTE — Progress Notes (Signed)
I am not sure how this happened.  I have place another order with the specific information you provided.  Thank you for making me aware.

## 2022-04-06 NOTE — Progress Notes (Signed)
Order placed for vestibular rehab therapy.

## 2022-04-08 DIAGNOSIS — H04123 Dry eye syndrome of bilateral lacrimal glands: Secondary | ICD-10-CM | POA: Diagnosis not present

## 2022-04-08 DIAGNOSIS — R682 Dry mouth, unspecified: Secondary | ICD-10-CM | POA: Diagnosis not present

## 2022-04-08 DIAGNOSIS — R768 Other specified abnormal immunological findings in serum: Secondary | ICD-10-CM | POA: Diagnosis not present

## 2022-04-08 DIAGNOSIS — Z8679 Personal history of other diseases of the circulatory system: Secondary | ICD-10-CM | POA: Diagnosis not present

## 2022-04-08 DIAGNOSIS — M1990 Unspecified osteoarthritis, unspecified site: Secondary | ICD-10-CM | POA: Diagnosis not present

## 2022-04-09 ENCOUNTER — Ambulatory Visit: Payer: PPO | Admitting: Physical Therapy

## 2022-04-13 NOTE — Therapy (Unsigned)
OUTPATIENT PHYSICAL THERAPY VESTIBULAR EVALUATION   Patient Name: Lynn Mann MRN: [id number] DOB:[birth year], 71 y.o., female Today's Date: 04/16/2022  PCP: Einar Pheasant, MD REFERRING PROVIDER: Einar Pheasant, MD   PT End of Session - 04/15/22 1411     Visit Number 1    Number of Visits 9    Date for PT Re-Evaluation 06/10/22    Authorization Type eval: 04/15/22    PT Start Time 1410    PT Stop Time 1445    PT Time Calculation (min) 35 min    Activity Tolerance Patient tolerated treatment well    Behavior During Therapy [facility] for tasks assessed/performed             Past Medical History:  Diagnosis Date   Arthritis    C. difficile diarrhea    age 53s-40s    Chicken pox    Cholecystitis 11/2011   Did not require sgy - Dr. Staci Acosta - [facility]  (cholelithiasis)   H/O Clostridium difficile infection    IBS (irritable bowel syndrome)    MRSA exposure 2005   Spider bite   MVP (mitral valve prolapse)    Stable - Dr. Ubaldo Glassing   Rheumatic fever    Past Surgical History:  Procedure Laterality Date   CATARACT EXTRACTION  [id number]   MUSCLE BIOPSY     Patient Active Problem List   Diagnosis Date Noted   Nocturia 03/12/2022   Snoring 03/12/2022   Muscle weakness 03/11/2022   Joint ache 02/11/2022   Ankle pain 02/05/2022   Fever 12/26/2021   Abdominal pain 12/26/2021   Cough 09/07/2021   Right shoulder pain 07/15/2021   Hematoma 02/11/2021   Ankle swelling 11/26/2020   Low back pain 11/22/2020   Hip pain, right 11/22/2020   Light headedness 06/24/2020   Hearing loss 06/24/2020   Binocular vision disorder with diplopia 06/24/2020   Stress 05/17/2020   Chest pain 05/16/2020   Welcome to Medicare preventive visit 06/20/2019   Viral syndrome 01/30/2019   Itching 07/21/2017   Asthma 07/16/2016   Urinary incontinence 07/16/2016   SOB (shortness of breath) 04/01/2016   Bilateral shoulder pain 02/24/2016   Fatigue 01/28/2016   Neck fullness 01/28/2016   Routine  general medical examination at a health care facility 07/25/2015   Health care maintenance 10/09/2014   Neck pain 11/26/2013   Hypercholesterolemia 11/26/2013   History of colonic polyps 05/11/2013   Hyperbilirubinemia 01/18/2013   GERD (gastroesophageal reflux disease) 12/03/2012   Cholelithiasis 11/07/2012   IBS (irritable bowel syndrome) 11/07/2012   History of rheumatic fever 08/28/2012   MVP (mitral valve prolapse) 08/28/2012     PCP: Einar Pheasant, MD  REFERRING PROVIDER: Einar Pheasant, MD  REFERRING DIAGNOSIS: R42 (ICD-10-CM) - Dizziness  THERAPY DIAG: Dizziness and giddiness  RATIONALE FOR EVALUATION AND TREATMENT: Rehabilitation  ONSET DATE: 09/26/21 (approximate)  FOLLOW UP APPT WITH PROVIDER: Not asked   SUBJECTIVE:   Chief Complaint:  Dizziness   Pertinent History Pt reports that sometime in March/April she woke up with vertigo. Her symptoms lasted the rest of the day. She eventually scheduled with [facility] where they performed the Epley Maneuver which resolved her vertigo. However, pt reports that she still has persistent "spatial disorientation." At some point after the initial vertigo onset pt started to notice double vision. She went to see her eye doctor and eventually ended up with a referral to neurology. She had a negative MRI Brain without contrast for any brainstem lesion and negative Myasthenia Gravis  labs. She reports that neurology mentioned a referral to neurophthalmology but she never received a referral. She has also seen cardiology to work up her symptoms. Holter monitor revealed an average rate of 68. There were no prolonged pauses. Her heart rate gets in the upper 30s during sleeping hours and as high as 140 during activity. There are no pauses or significant bradycardic episodes during waking hours. Sleep study which showed no significant sleep apnea. She underwent exercise stress echo where she went just over 4 minutes with no arrhythmia.  Echo 11/21 images showed normal resting LV function with what appeared to be augmentation of all segments of the cardiac myocardium with stress. No significant valvular normalities. This did not suggest significant ischemia. She denies any further vertigo but notices that when she rolls over she "feels like she is sinking." She recently finished pelvic floor therapy. She was seen in the past by this therapist for R hip/groin pain. She has also done vestibular therapy in the past for dizziness/imbalance. Patient's husband passed unexpectedly 03/26/20 but pt reports that she is currently doing well without any significant stress.   Brain MRI: 11/11/21 IMPRESSION: No evidence of acute intracranial abnormality.   Mild chronic small vessel ischemic changes within the cerebral white matter, stable from the prior brain MRI of 09/14/2020.  MRA Head: 01/05/22 IMPRESSION: 1. No intracranial large vessel occlusion or proximal high-grade arterial stenosis. 2. 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 A CTA of the head may be helpful for further evaluation.  Description of dizziness: unsteadiness and visual disorientation ("room looks glassy/fish-bowly") Frequency: "mostly constant." Pt states that it does change with her environment Duration: "mostly constant" Symptom nature: constant Progression of symptoms since onset: no change History of similar episodes: Yes, pt reports that years ago she was told that she had an issue with her eye tracking but it never created any issues.   Auditory complaints (tinnitus, pain, drainage, hearing loss, aural fullness): Yes, slight tinnitus bilaterally. Pt reports some "stuffiness" in her ears the last couple days Vision changes (diplopia, visual field loss, recent changes, recent eye exam): Yes, pt complaining of double vision Chest pain/palpitations: No History of head injury/concussion: No Stress/anxiety:  No Headaches/migraines: Prior history of migraines but after her dtr was born she hasn't had headaches  Has patient fallen in last 6 months? 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 Positive: Reports getting hot at night  PRECAUTIONS: None  WEIGHT BEARING RESTRICTIONS No  LIVING ENVIRONMENT: Lives with: lives alone Lives in: House/apartment Stairs: 1 step to enter with a door handle she can hold   PATIENT GOALS: Improve confidence with walking so she can walk more, improve her ability to play with her grandchildren, improve stability so she can help her older friends;    OBJECTIVE EXAMINATION   NEUROLOGICAL SCREEN: (2+ unless otherwise noted.) N=normal  Ab=abnormal  Level Dermatome R L Myotome R L Reflex R L  C3 Anterior Neck N N Sidebend C2-3 N N Jaw CN V    C4 Top of Shoulder N N Shoulder Shrug C4 N N Hoffman's  UMN    C5 Lateral Upper Arm N N Shoulder ABD C4-5 N N Biceps C5-6    C6 Lateral Arm/ Thumb N N Arm Flex/ Wrist Ext C5-6 N N Brachiorad. C5-6    C7 Middle Finger N N Arm Ext//Wrist Flex C6-7 N N Triceps C7    C8 4th & 5th Finger N N Flex/ Ext Carpi Ulnaris C8 N N Patellar (L3-4)    T1 Medial Arm N N Interossei T1 N N Gastrocnemius    L2 Medial thigh/groin N N Illiopsoas (L2-3) N N     L3 Lower thigh/med.knee N N Quadriceps (L3-4) N N     L4 Medial leg/lat thigh N N Tibialis Ant (L4-5) N N     L5 Lat. leg & dorsal foot N N EHL (L5) N N     S1 post/lat foot/thigh/leg N N Gastrocnemius (S1-2) N N     S2 Post./med. thigh & leg N N Hamstrings (L4-S3) N N       CRANIAL  NERVES II, III, IV, VI: Pupils equal and reactive to light, visual acuity and visual fields are intact, extraocular muscles are intact  V: Facial sensation is intact and symmetric bilaterally  VII: Facial strength is intact and symmetric bilaterally  VIII: Hearing is normal as tested by gross conversation IX, X: Palate elevates midline, normal phonation, uvula midline XI: Shoulder shrug strength is intact  XII: Tongue protrudes midline    SOMATOSENSORY Grossly intact to light touch bilateral UEs/LEs as determined by testing dermatomes C2-T2 and L2-S2. Proprioception and hot/cold testing deferred on this date.   COORDINATION Finger to Nose: Normal Heel to Shin: Normal Pronator Drift: Negative Rapid Alternating Movements: Normal Finger to Thumb Opposition: Normal    RANGE OF MOTION Cervical Spine AROM grossly WFL and painless in all directions. No focal deficits in AROM noted in [place] strength WNL without focal deficits with the exception of 4/5 bilateral ankle dorsiflexion and 4/5 R shoulder flexion  TRANSFERS/GAIT Independent for transfers and ambulation without assistive device    PATIENT SURVEYS FOTO: To be completed (pt arrived late so unable to complete)  ABC: To be completed  Clinical Test of Sensory Interaction for Balance (CTSIB): Deferred   OCULOMOTOR / VESTIBULAR TESTING  Oculomotor Exam- Room Light  Findings Comments  Ocular Alignment normal   Ocular ROM normal   Spontaneous Nystagmus normal   Gaze-Holding Nystagmus normal   End-Gaze Nystagmus normal   Vergence (normal 2-3") abnormal Pt reporting blurriness >24" from her nose  Smooth Pursuit abnormal Saccadic intrusions noted  Cross-Cover Test normal   Saccades normal   VOR Cancellation normal   Left Head Impulse normal   Right Head Impulse abnormal Corrective saccade required  Static Acuity not examined   Dynamic Acuity not examined     Oculomotor Exam- Fixation  Suppressed: Deferred   BPPV TESTS:  Symptoms Duration Intensity Nystagmus  L Dix-Hallpike Dizziness and visual disorientation   Downbeating  R Dix-Hallpike Visual disorientation "things are out of place"   None  L Head Roll Mild nausea   None  R Head Roll Nausea, spinning   After approximately 20 seconds pt starts with vigorous upbeating R torsional nystagmus x 5-7 seconds;  L Sidelying Test      R Sidelying Test      (blank = not tested)   FUNCTIONAL OUTCOME MEASURES   Results Comments  BERG    DGI    FGA    TUG  5TSTS    6 Minute Walk Test    10 Meter Gait Speed    ABC Scale    DHI    (blank = not tested)   TODAY'S TREATMENT  None    ASSESSMENT:  CLINICAL IMPRESSION: Patient is a 71 y.o. female who was seen today for physical therapy evaluation and treatment for dizzness. Objective impairments include decreased balance, decreased strength, and dizziness. Vestibular/oculomotor testing reveals abnormal smooth pursuit, convergence, and R Head Impulse Test. Most significantly she presents with vertigo and nystagmus consistent with R posterior canal BPPV. Due to time constraints unable to initiate treatment today but at first follow-up appointment will perform canalith repositioning maneuvers. Once clear will proceed to additional balance/mobility testing. These impairments are limiting patient from shopping, community activity, and church. Personal factors including Age, Past/current experiences, Time since onset of injury/illness/exacerbation, and 1-2 comorbidities: diplopia, ankle swelling  are also affecting patient's functional outcome. Patient will benefit from skilled PT to address above impairments and improve overall function.  REHAB POTENTIAL: Excellent  CLINICAL DECISION MAKING: Evolving/moderate complexity  EVALUATION COMPLEXITY: Moderate   GOALS: Goals reviewed with patient? No  SHORT TERM GOALS: Target date: 05/13/2022  Pt will be independent with HEP  for dizziness in order to decrease symptoms, improve balance,decrease fall risk, and improve function at home. Baseline: Goal status: INITIAL   LONG TERM GOALS: Target date: 06/10/2022  Pt will increase FOTO to at least the predicted improvement value to demonstrate significant improvement in function at home related to dizziness.  Baseline: 04/15/22: To be completed Goal status: INITIAL  2.  Pt will improve ABC by at least 13% in order to demonstrate clinically significant improvement in balance confidence.      Baseline: 04/15/22: To be completed Goal status: INITIAL  3. Pt will report at least 90% improvement in her dizziness symptoms in order to improve her ability to safely walk, play with her grandchildren, and help her older friends at church.      Baseline:  Goal status: INITIAL   PLAN:  PT FREQUENCY: 1x/week  PT DURATION: 8 weeks  PLANNED INTERVENTIONS: Therapeutic exercises, Therapeutic activity, Neuromuscular re-education, Balance training, Gait training, Patient/Family education, Joint manipulation, Joint mobilization, Canalith repositioning, Aquatic Therapy, Dry Needling, Cognitive remediation, Electrical stimulation, Spinal manipulation, Spinal mobilization, Cryotherapy, Moist heat, Traction, Ultrasound, Ionotophoresis '4mg'$ /ml Dexamethasone, and Manual therapy  PLAN FOR NEXT SESSION: BPPV testing, CRT, once clear perform additional balance/strength testing and initiate treatment as necessary.   Lyndel Safe Arnel Wymer PT, DPT, GCS  Natnael Biederman 04/16/2022, 1:42 PM

## 2022-04-15 ENCOUNTER — Ambulatory Visit: Payer: PPO

## 2022-04-15 DIAGNOSIS — M6281 Muscle weakness (generalized): Secondary | ICD-10-CM | POA: Diagnosis not present

## 2022-04-15 DIAGNOSIS — R42 Dizziness and giddiness: Secondary | ICD-10-CM

## 2022-04-16 ENCOUNTER — Encounter: Payer: PPO | Admitting: Physical Therapy

## 2022-04-17 DIAGNOSIS — H5203 Hypermetropia, bilateral: Secondary | ICD-10-CM | POA: Diagnosis not present

## 2022-04-17 DIAGNOSIS — H353131 Nonexudative age-related macular degeneration, bilateral, early dry stage: Secondary | ICD-10-CM | POA: Diagnosis not present

## 2022-04-17 DIAGNOSIS — H532 Diplopia: Secondary | ICD-10-CM | POA: Diagnosis not present

## 2022-04-17 DIAGNOSIS — H519 Unspecified disorder of binocular movement: Secondary | ICD-10-CM | POA: Diagnosis not present

## 2022-04-17 DIAGNOSIS — H04123 Dry eye syndrome of bilateral lacrimal glands: Secondary | ICD-10-CM | POA: Diagnosis not present

## 2022-04-23 ENCOUNTER — Ambulatory Visit: Payer: PPO

## 2022-04-23 DIAGNOSIS — R42 Dizziness and giddiness: Secondary | ICD-10-CM

## 2022-04-23 DIAGNOSIS — M6281 Muscle weakness (generalized): Secondary | ICD-10-CM | POA: Diagnosis not present

## 2022-04-23 NOTE — Therapy (Signed)
OUTPATIENT PHYSICAL THERAPY VESTIBULAR TREATMENT   Patient Name: Lynn Mann MRN: [id number] DOB:1951/02/01, 71 y.o., female Today's Date: 04/23/2022  PCP: Einar Pheasant, MD REFERRING PROVIDER: Einar Pheasant, MD   PT End of Session - 04/23/22 0849     Visit Number 2    Number of Visits 9    Date for PT Re-Evaluation 06/10/22    Authorization Type eval: 04/15/22    PT Start Time 0850    PT Stop Time 0930    PT Time Calculation (min) 40 min    Activity Tolerance Patient tolerated treatment well    Behavior During Therapy [facility] for tasks assessed/performed             Past Medical History:  Diagnosis Date   Arthritis    C. difficile diarrhea    age 93s-40s    Chicken pox    Cholecystitis 11/2011   Did not require sgy - Dr. Staci Acosta - [facility]  (cholelithiasis)   H/O Clostridium difficile infection    IBS (irritable bowel syndrome)    MRSA exposure 2005   Spider bite   MVP (mitral valve prolapse)    Stable - Dr. Ubaldo Glassing   Rheumatic fever    Past Surgical History:  Procedure Laterality Date   CATARACT EXTRACTION  [id number]   MUSCLE BIOPSY     Patient Active Problem List   Diagnosis Date Noted   Nocturia 03/12/2022   Snoring 03/12/2022   Muscle weakness 03/11/2022   Joint ache 02/11/2022   Ankle pain 02/05/2022   Fever 12/26/2021   Abdominal pain 12/26/2021   Cough 09/07/2021   Right shoulder pain 07/15/2021   Hematoma 02/11/2021   Ankle swelling 11/26/2020   Low back pain 11/22/2020   Hip pain, right 11/22/2020   Light headedness 06/24/2020   Hearing loss 06/24/2020   Binocular vision disorder with diplopia 06/24/2020   Stress 05/17/2020   Chest pain 05/16/2020   Welcome to Medicare preventive visit 06/20/2019   Viral syndrome 01/30/2019   Itching 07/21/2017   Asthma 07/16/2016   Urinary incontinence 07/16/2016   SOB (shortness of breath) 04/01/2016   Bilateral shoulder pain 02/24/2016   Fatigue 01/28/2016   Neck fullness 01/28/2016   Routine  general medical examination at a health care facility 07/25/2015   Health care maintenance 10/09/2014   Neck pain 11/26/2013   Hypercholesterolemia 11/26/2013   History of colonic polyps 05/11/2013   Hyperbilirubinemia 01/18/2013   GERD (gastroesophageal reflux disease) 12/03/2012   Cholelithiasis 11/07/2012   IBS (irritable bowel syndrome) 11/07/2012   History of rheumatic fever 08/28/2012   MVP (mitral valve prolapse) 08/28/2012     PCP: Einar Pheasant, MD  REFERRING PROVIDER: Einar Pheasant, MD  REFERRING DIAGNOSIS: R42 (ICD-10-CM) - Dizziness  THERAPY DIAG: Dizziness and giddiness  RATIONALE FOR EVALUATION AND TREATMENT: Rehabilitation  ONSET DATE: 09/26/21 (approximate)  FOLLOW UP APPT WITH PROVIDER: Not asked   FROM INITIAL EVALUATION (04/15/22)  SUBJECTIVE:   Chief Complaint:  Dizziness   Pertinent History Pt reports that sometime in March/April she woke up with vertigo. Her symptoms lasted the rest of the day. She eventually scheduled with [facility] where they performed the Epley Maneuver which resolved her vertigo. However, pt reports that she still has persistent "spatial disorientation." At some point after the initial vertigo onset pt started to notice double vision. She went to see her eye doctor and eventually ended up with a referral to neurology. She had a negative MRI Brain without contrast for any brainstem  lesion and negative Myasthenia Gravis labs. She reports that neurology mentioned a referral to neurophthalmology but she never received a referral. She has also seen cardiology to work up her symptoms. Holter monitor revealed an average rate of 68. There were no prolonged pauses. Her heart rate gets in the upper 30s during sleeping hours and as high as 140 during activity. There are no pauses or significant bradycardic episodes during waking hours. Sleep study which showed no significant sleep apnea. She underwent exercise stress echo where she went just  over 4 minutes with no arrhythmia. Echo 11/21 images showed normal resting LV function with what appeared to be augmentation of all segments of the cardiac myocardium with stress. No significant valvular normalities. This did not suggest significant ischemia. She denies any further vertigo but notices that when she rolls over she "feels like she is sinking." She recently finished pelvic floor therapy. She was seen in the past by this therapist for R hip/groin pain. She has also done vestibular therapy in the past for dizziness/imbalance. Patient's husband passed unexpectedly 03/26/20 but pt reports that she is currently doing well without any significant stress.   Brain MRI: 11/11/21 IMPRESSION: No evidence of acute intracranial abnormality.   Mild chronic small vessel ischemic changes within the cerebral white matter, stable from the prior brain MRI of 09/14/2020.  MRA Head: 01/05/22 IMPRESSION: 1. No intracranial large vessel occlusion or proximal high-grade arterial stenosis. 2. 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 A CTA of the head may be helpful for further evaluation.  Description of dizziness: unsteadiness and visual disorientation ("room looks glassy/fish-bowly") Frequency: "mostly constant." Pt states that it does change with her environment Duration: "mostly constant" Symptom nature: constant Progression of symptoms since onset: no change History of similar episodes: Yes, pt reports that years ago she was told that she had an issue with her eye tracking but it never created any issues.   Auditory complaints (tinnitus, pain, drainage, hearing loss, aural fullness): Yes, slight tinnitus bilaterally. Pt reports some "stuffiness" in her ears the last couple days Vision changes (diplopia, visual field loss, recent changes, recent eye exam): Yes, pt complaining of double vision Chest pain/palpitations: No History of head  injury/concussion: No Stress/anxiety: No Headaches/migraines: Prior history of migraines but after her dtr was born she hasn't had headaches  Has patient fallen in last 6 months? 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 Positive: Reports getting hot at night  PRECAUTIONS: None  WEIGHT BEARING RESTRICTIONS No  LIVING ENVIRONMENT: Lives with: lives alone Lives in: House/apartment Stairs: 1 step to enter with a door handle she can hold   PATIENT GOALS: Improve confidence with walking so she can walk more, improve her ability to play with her grandchildren, improve stability so she can help her older friends;    OBJECTIVE EXAMINATION   NEUROLOGICAL SCREEN: (2+ unless otherwise noted.) N=normal  Ab=abnormal  Level Dermatome R L Myotome R L Reflex R L  C3 Anterior Neck N N Sidebend C2-3 N N Jaw CN V    C4 Top of Shoulder N N Shoulder  Shrug C4 N N Hoffman's UMN    C5 Lateral Upper Arm N N Shoulder ABD C4-5 N N Biceps C5-6    C6 Lateral Arm/ Thumb N N Arm Flex/ Wrist Ext C5-6 N N Brachiorad. C5-6    C7 Middle Finger N N Arm Ext//Wrist Flex C6-7 N N Triceps C7    C8 4th & 5th Finger N N Flex/ Ext Carpi Ulnaris C8 N N Patellar (L3-4)    T1 Medial Arm N N Interossei T1 N N Gastrocnemius    L2 Medial thigh/groin N N Illiopsoas (L2-3) N N     L3 Lower thigh/med.knee N N Quadriceps (L3-4) N N     L4 Medial leg/lat thigh N N Tibialis Ant (L4-5) N N     L5 Lat. leg & dorsal foot N N EHL (L5) N N     S1 post/lat foot/thigh/leg N N Gastrocnemius (S1-2) N N     S2 Post./med. thigh & leg N N  Hamstrings (L4-S3) N N       CRANIAL NERVES II, III, IV, VI: Pupils equal and reactive to light, visual acuity and visual fields are intact, extraocular muscles are intact  V: Facial sensation is intact and symmetric bilaterally  VII: Facial strength is intact and symmetric bilaterally  VIII: Hearing is normal as tested by gross conversation IX, X: Palate elevates midline, normal phonation, uvula midline XI: Shoulder shrug strength is intact  XII: Tongue protrudes midline    SOMATOSENSORY Grossly intact to light touch bilateral UEs/LEs as determined by testing dermatomes C2-T2 and L2-S2. Proprioception and hot/cold testing deferred on this date.   COORDINATION Finger to Nose: Normal Heel to Shin: Normal Pronator Drift: Negative Rapid Alternating Movements: Normal Finger to Thumb Opposition: Normal    RANGE OF MOTION Cervical Spine AROM grossly WFL and painless in all directions. No focal deficits in AROM noted in [place] strength WNL without focal deficits with the exception of 4/5 bilateral ankle dorsiflexion and 4/5 R shoulder flexion  TRANSFERS/GAIT Independent for transfers and ambulation without assistive device    PATIENT SURVEYS FOTO: To be completed (pt arrived late so unable to complete)  ABC: To be completed  Clinical Test of Sensory Interaction for Balance (CTSIB): Deferred   OCULOMOTOR / VESTIBULAR TESTING  Oculomotor Exam- Room Light  Findings Comments  Ocular Alignment normal   Ocular ROM normal   Spontaneous Nystagmus normal   Gaze-Holding Nystagmus normal   End-Gaze Nystagmus normal   Vergence (normal 2-3") abnormal Pt reporting blurriness >24" from her nose  Smooth Pursuit abnormal Saccadic intrusions noted  Cross-Cover Test normal   Saccades normal   VOR Cancellation normal   Left Head Impulse normal   Right Head Impulse abnormal Corrective saccade required  Static Acuity not examined   Dynamic Acuity not  examined     Oculomotor Exam- Fixation Suppressed: Deferred   BPPV TESTS:  Symptoms Duration Intensity Nystagmus  L Dix-Hallpike Dizziness and visual disorientation   Downbeating  R Dix-Hallpike Visual disorientation "things are out of place"   None  L Head Roll Mild nausea   None  R Head Roll Nausea, spinning   After approximately 20 seconds pt starts with vigorous upbeating R torsional nystagmus x 5-7 seconds;  L Sidelying Test      R Sidelying Test      (blank = not tested)   FUNCTIONAL OUTCOME MEASURES   Results Comments  BERG    DGI    FGA  TUG    5TSTS    6 Minute Walk Test    10 Meter Gait Speed    ABC Scale    DHI    (blank = not tested)   TODAY'S TREATMENT    SUBJECTIVE: Pt reports continued intermittent episodes of "sea-sickness" or "dizziness/whooziness" since the initial evaluation. No significant improvement or worsening reported. She reports some neck soreness after the initial evaluation related to the neck motions required during examination. No specific questions upon arrival.    Canalith Repositioning Treatment All testing and maneuvers performed on inverted mat table and with body rolling for positioning to minimize cervical rotation. R Dix-Hallpike is negative for dizziness but positive for 5-7 beats of faint upbeating R torsional nystagmus. Pt treated with 2 bouts of Epley Maneuver for presumed R posterior canal BPPV. Two minute holds in each position and retesting between maneuvers. During first maneuver in second position pt starts with upbeating L torsional nystagmus and concurrent symptoms. Proceeded to complete the maneuver. Retesting after the first maneuver reveals negative L Dix-Hallpike test and R Dix-Hallpike test is positive for symptoms and pure downbeating nystagmus. Completed second Epley Maneuver followed by education about BPPV with handout.   PATIENT EDUCATION:  Education details: BPPV Person educated: Patient Education method:  Explanation Education comprehension: verbalized understanding   HOME EXERCISE PROGRAM:  None currently    ASSESSMENT:  CLINICAL IMPRESSION: All testing and maneuvers performed on inverted mat table and with body rolling for positioning to minimize cervical rotation. R Dix-Hallpike is negative for dizziness but positive for 5-7 beats of faint upbeating R torsional nystagmus. Pt treated with 2 bouts of Epley Maneuver for presumed R posterior canal BPPV. Two minute holds in each position and retesting between maneuvers. During first maneuver in second position pt starts with upbeating L torsional nystagmus and concurrent symptoms. Proceeded to complete the maneuver. Retesting after the first maneuver reveals negative L Dix-Hallpike test and R Dix-Hallpike test is positive for symptoms and pure downbeating nystagmus. Completed second Epley Maneuver followed by education about BPPV with handout.  Will repeat testing at next follow-up visit and treat as indicated. Once clear will proceed to additional vestibular/oculomotor and balance testing. Patient will benefit from skilled PT to address above impairments and improve overall function.  REHAB POTENTIAL: Excellent  CLINICAL DECISION MAKING: Evolving/moderate complexity  EVALUATION COMPLEXITY: Moderate   GOALS: Goals reviewed with patient? No  SHORT TERM GOALS: Target date: 05/13/2022  Pt will be independent with HEP for dizziness in order to decrease symptoms, improve balance,decrease fall risk, and improve function at home. Baseline: Goal status: INITIAL   LONG TERM GOALS: Target date: 06/10/2022  Pt will increase FOTO to at least 59 in order to demonstrate significant improvement in function at home related to dizziness.  Baseline: 04/15/22: To be completed; 04/23/22: 39 Goal status: INITIAL  2.  Pt will improve ABC by at least 13% in order to demonstrate clinically significant improvement in balance confidence.      Baseline:  04/15/22: To be completed Goal status: INITIAL  3. Pt will report at least 90% improvement in her dizziness symptoms in order to improve her ability to safely walk, play with her grandchildren, and help her older friends at church.      Baseline:  Goal status: INITIAL   PLAN:  PT FREQUENCY: 1-2x/week  PT DURATION: 8 weeks  PLANNED INTERVENTIONS: Therapeutic exercises, Therapeutic activity, Neuromuscular re-education, Balance training, Gait training, Patient/Family education, Joint manipulation, Joint mobilization, Canalith repositioning, Aquatic Therapy, Dry Needling,  Cognitive remediation, Electrical stimulation, Spinal manipulation, Spinal mobilization, Cryotherapy, Moist heat, Traction, Ultrasound, Ionotophoresis '4mg'$ /ml Dexamethasone, and Manual therapy  PLAN FOR NEXT SESSION: BPPV testing, CRT as needed once clear perform fixations suppression oculomotor/vestibular testing as well as balance/strength testing.   Lyndel Safe Haydyn Liddell PT, DPT, GCS  Teya Otterson 04/23/2022, 9:31 AM

## 2022-04-24 ENCOUNTER — Ambulatory Visit: Payer: PPO | Admitting: Urology

## 2022-04-24 ENCOUNTER — Encounter: Payer: Self-pay | Admitting: Urology

## 2022-04-24 VITALS — BP 110/74 | HR 105 | Ht 67.0 in | Wt 172.0 lb

## 2022-04-24 DIAGNOSIS — N3281 Overactive bladder: Secondary | ICD-10-CM

## 2022-04-24 DIAGNOSIS — N3941 Urge incontinence: Secondary | ICD-10-CM | POA: Diagnosis not present

## 2022-04-24 MED ORDER — MIRABEGRON ER 50 MG PO TB24: 50.0000 mg | ORAL_TABLET | Freq: Every day | ORAL | 0 refills | Status: DC

## 2022-04-24 NOTE — Patient Instructions (Signed)

## 2022-04-24 NOTE — Progress Notes (Signed)
   04/24/2022 12:23 PM   Lynn Mann Jan 23, 1951 [id number]  Reason for visit: Follow up urge incontinence  HPI: 71 year old female who I previously saw in September 2020 for urge incontinence.  She does not have any stress incontinence, and really only has incontinence in the evening with urgency where she is unable to make it to the bathroom and has a large volume of urge incontinence.  At that time she was not interested in trying any medications and opted for PTNS, however she never followed up secondary to the [place] pandemic.  She previously did have some significant improvement with pelvic floor physical therapy, but did not want to see them either during the pandemic.   She continues to have bothersome symptoms of urge incontinence in the evenings and overnight.  She really does well for the most part during the day.  She is working with physical therapy already, and they have started to incorporate some pelvic floor exercises in the last few weeks as well.  I viewed those outside physical therapy notes.  She is very bothered by the overnight incontinence and is interested in other options.  Urinalysis and culture in June 2023 were negative.  We discussed that overactive bladder (OAB) is not a disease, but is a symptom complex that is generally not life-threatening.  Symptoms typically include urinary urgency, frequency, and urge incontinence.  There are numerous treatment options, however there are risks and benefits with both medical and surgical management.  First-line treatment is behavioral therapies including bladder training, pelvic floor muscle training, and fluid management.  Second line treatments include oral antimuscarinics(Ditropan er, Trospium) and beta-3 agonist (Mybetriq). There is typically a period of medication trial (4-8 weeks) to find the optimal therapy and dosing. If symptoms are bothersome despite the above management, third line options include intra-detrusor botox,  peripheral tibial nerve stimulation (PTNS), and interstim (SNS). These are more invasive treatments with higher side effect profile, but may improve quality of life for patients with severe OAB symptoms.   -Trial of Myrbetriq 50 mg daily, 1 month samples given -Continue pelvic floor physical therapy -Follow-up in 1 month with Dr Matilde Sprang    Billey Co, MD  [facility] [address] [place],  [address] [phone]

## 2022-04-30 ENCOUNTER — Ambulatory Visit: Payer: PPO | Attending: Internal Medicine

## 2022-04-30 DIAGNOSIS — M6281 Muscle weakness (generalized): Secondary | ICD-10-CM | POA: Insufficient documentation

## 2022-04-30 DIAGNOSIS — R42 Dizziness and giddiness: Secondary | ICD-10-CM | POA: Diagnosis not present

## 2022-04-30 NOTE — Therapy (Unsigned)
OUTPATIENT PHYSICAL THERAPY VESTIBULAR TREATMENT   Patient Name: Lynn Mann MRN: [id number] DOB:1951-02-06, 71 y.o., female Today's Date: 05/01/2022  PCP: Einar Pheasant, MD REFERRING PROVIDER: Einar Pheasant, MD   PT End of Session - 04/30/22 1418     Visit Number 3    Number of Visits 9    Date for PT Re-Evaluation 06/10/22    Authorization Type eval: 04/15/22    PT Start Time 1415    PT Stop Time 1445    PT Time Calculation (min) 30 min    Activity Tolerance Patient tolerated treatment well    Behavior During Therapy [facility] for tasks assessed/performed             Past Medical History:  Diagnosis Date   Arthritis    C. difficile diarrhea    age 5s-40s    Chicken pox    Cholecystitis 11/2011   Did not require sgy - Dr. Staci Acosta - [facility]  (cholelithiasis)   H/O Clostridium difficile infection    IBS (irritable bowel syndrome)    MRSA exposure 2005   Spider bite   MVP (mitral valve prolapse)    Stable - Dr. Ubaldo Glassing   Rheumatic fever    Past Surgical History:  Procedure Laterality Date   CATARACT EXTRACTION  [id number]   MUSCLE BIOPSY     Patient Active Problem List   Diagnosis Date Noted   Nocturia 03/12/2022   Snoring 03/12/2022   Muscle weakness 03/11/2022   Joint ache 02/11/2022   Ankle pain 02/05/2022   Fever 12/26/2021   Abdominal pain 12/26/2021   Cough 09/07/2021   Right shoulder pain 07/15/2021   Hematoma 02/11/2021   Ankle swelling 11/26/2020   Low back pain 11/22/2020   Hip pain, right 11/22/2020   Light headedness 06/24/2020   Hearing loss 06/24/2020   Binocular vision disorder with diplopia 06/24/2020   Stress 05/17/2020   Chest pain 05/16/2020   Welcome to Medicare preventive visit 06/20/2019   Viral syndrome 01/30/2019   Itching 07/21/2017   Asthma 07/16/2016   Urinary incontinence 07/16/2016   SOB (shortness of breath) 04/01/2016   Bilateral shoulder pain 02/24/2016   Fatigue 01/28/2016   Neck fullness 01/28/2016   Routine  general medical examination at a health care facility 07/25/2015   Health care maintenance 10/09/2014   Neck pain 11/26/2013   Hypercholesterolemia 11/26/2013   History of colonic polyps 05/11/2013   Hyperbilirubinemia 01/18/2013   GERD (gastroesophageal reflux disease) 12/03/2012   Cholelithiasis 11/07/2012   IBS (irritable bowel syndrome) 11/07/2012   History of rheumatic fever 08/28/2012   MVP (mitral valve prolapse) 08/28/2012     PCP: Einar Pheasant, MD  REFERRING PROVIDER: Einar Pheasant, MD  REFERRING DIAGNOSIS: R42 (ICD-10-CM) - Dizziness  THERAPY DIAG: Dizziness and giddiness  Muscle weakness (generalized)  RATIONALE FOR EVALUATION AND TREATMENT: Rehabilitation  ONSET DATE: 09/26/21 (approximate)  FOLLOW UP APPT WITH PROVIDER: Not asked   FROM INITIAL EVALUATION (04/15/22)  SUBJECTIVE:   Chief Complaint:  Dizziness   Pertinent History Pt reports that sometime in March/April she woke up with vertigo. Her symptoms lasted the rest of the day. She eventually scheduled with [facility] where they performed the Epley Maneuver which resolved her vertigo. However, pt reports that she still has persistent "spatial disorientation." At some point after the initial vertigo onset pt started to notice double vision. She went to see her eye doctor and eventually ended up with a referral to neurology. She had a negative MRI Brain without  contrast for any brainstem lesion and negative Myasthenia Gravis labs. She reports that neurology mentioned a referral to neurophthalmology but she never received a referral. She has also seen cardiology to work up her symptoms. Holter monitor revealed an average rate of 68. There were no prolonged pauses. Her heart rate gets in the upper 30s during sleeping hours and as high as 140 during activity. There are no pauses or significant bradycardic episodes during waking hours. Sleep study which showed no significant sleep apnea. She underwent exercise  stress echo where she went just over 4 minutes with no arrhythmia. Echo 11/21 images showed normal resting LV function with what appeared to be augmentation of all segments of the cardiac myocardium with stress. No significant valvular normalities. This did not suggest significant ischemia. She denies any further vertigo but notices that when she rolls over she "feels like she is sinking." She recently finished pelvic floor therapy. She was seen in the past by this therapist for R hip/groin pain. She has also done vestibular therapy in the past for dizziness/imbalance. Patient's husband passed unexpectedly 03/26/20 but pt reports that she is currently doing well without any significant stress.   Brain MRI: 11/11/21 IMPRESSION: No evidence of acute intracranial abnormality.   Mild chronic small vessel ischemic changes within the cerebral white matter, stable from the prior brain MRI of 09/14/2020.  MRA Head: 01/05/22 IMPRESSION: 1. No intracranial large vessel occlusion or proximal high-grade arterial stenosis. 2. 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 A CTA of the head may be helpful for further evaluation.  Description of dizziness: unsteadiness and visual disorientation ("room looks glassy/fish-bowly") Frequency: "mostly constant." Pt states that it does change with her environment Duration: "mostly constant" Symptom nature: constant Progression of symptoms since onset: no change History of similar episodes: Yes, pt reports that years ago she was told that she had an issue with her eye tracking but it never created any issues.   Auditory complaints (tinnitus, pain, drainage, hearing loss, aural fullness): Yes, slight tinnitus bilaterally. Pt reports some "stuffiness" in her ears the last couple days Vision changes (diplopia, visual field loss, recent changes, recent eye exam): Yes, pt complaining of double vision Chest  pain/palpitations: No History of head injury/concussion: No Stress/anxiety: No Headaches/migraines: Prior history of migraines but after her dtr was born she hasn't had headaches  Has patient fallen in last 6 months? 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 Positive: Reports getting hot at night  PRECAUTIONS: None  WEIGHT BEARING RESTRICTIONS No  LIVING ENVIRONMENT: Lives with: lives alone Lives in: House/apartment Stairs: 1 step to enter with a door handle she can hold   PATIENT GOALS: Improve confidence with walking so she can walk more, improve her ability to play with her grandchildren, improve stability so she can help her older friends;    OBJECTIVE EXAMINATION   NEUROLOGICAL SCREEN: (2+ unless otherwise noted.) N=normal  Ab=abnormal  Level Dermatome R L Myotome R L Reflex R L  C3 Anterior Neck N N Sidebend C2-3 N N Jaw CN V    C4 Top of  Shoulder N N Shoulder Shrug C4 N N Hoffman's UMN    C5 Lateral Upper Arm N N Shoulder ABD C4-5 N N Biceps C5-6    C6 Lateral Arm/ Thumb N N Arm Flex/ Wrist Ext C5-6 N N Brachiorad. C5-6    C7 Middle Finger N N Arm Ext//Wrist Flex C6-7 N N Triceps C7    C8 4th & 5th Finger N N Flex/ Ext Carpi Ulnaris C8 N N Patellar (L3-4)    T1 Medial Arm N N Interossei T1 N N Gastrocnemius    L2 Medial thigh/groin N N Illiopsoas (L2-3) N N     L3 Lower thigh/med.knee N N Quadriceps (L3-4) N N     L4 Medial leg/lat thigh N N Tibialis Ant (L4-5) N N     L5 Lat. leg & dorsal foot N N EHL (L5) N N     S1 post/lat foot/thigh/leg N N Gastrocnemius (S1-2) N N      S2 Post./med. thigh & leg N N Hamstrings (L4-S3) N N       CRANIAL NERVES II, III, IV, VI: Pupils equal and reactive to light, visual acuity and visual fields are intact, extraocular muscles are intact  V: Facial sensation is intact and symmetric bilaterally  VII: Facial strength is intact and symmetric bilaterally  VIII: Hearing is normal as tested by gross conversation IX, X: Palate elevates midline, normal phonation, uvula midline XI: Shoulder shrug strength is intact  XII: Tongue protrudes midline    SOMATOSENSORY Grossly intact to light touch bilateral UEs/LEs as determined by testing dermatomes C2-T2 and L2-S2. Proprioception and hot/cold testing deferred on this date.   COORDINATION Finger to Nose: Normal Heel to Shin: Normal Pronator Drift: Negative Rapid Alternating Movements: Normal Finger to Thumb Opposition: Normal    RANGE OF MOTION Cervical Spine AROM grossly WFL and painless in all directions. No focal deficits in AROM noted in [place] strength WNL without focal deficits with the exception of 4/5 bilateral ankle dorsiflexion and 4/5 R shoulder flexion  TRANSFERS/GAIT Independent for transfers and ambulation without assistive device    PATIENT SURVEYS FOTO: To be completed (pt arrived late so unable to complete)  ABC: To be completed  Clinical Test of Sensory Interaction for Balance (CTSIB): Deferred   OCULOMOTOR / VESTIBULAR TESTING  Oculomotor Exam- Room Light  Findings Comments  Ocular Alignment normal   Ocular ROM normal   Spontaneous Nystagmus normal   Gaze-Holding Nystagmus normal   End-Gaze Nystagmus normal   Vergence (normal 2-3") abnormal Pt reporting blurriness >24" from her nose  Smooth Pursuit abnormal Saccadic intrusions noted  Cross-Cover Test normal   Saccades normal   VOR Cancellation normal   Left Head Impulse normal   Right Head Impulse abnormal Corrective saccade required  Static Acuity not  examined   Dynamic Acuity not examined     Oculomotor Exam- Fixation Suppressed: Deferred   BPPV TESTS:  Symptoms Duration Intensity Nystagmus  L Dix-Hallpike Dizziness and visual disorientation   Downbeating  R Dix-Hallpike Visual disorientation "things are out of place"   None  L Head Roll Mild nausea   None  R Head Roll Nausea, spinning   After approximately 20 seconds pt starts with vigorous upbeating R torsional nystagmus x 5-7 seconds;  L Sidelying Test      R Sidelying Test      (blank = not tested)   FUNCTIONAL OUTCOME MEASURES   Results Comments  BERG    DGI  FGA    TUG    5TSTS    6 Minute Walk Test    10 Meter Gait Speed    ABC Scale    DHI    (blank = not tested)   TODAY'S TREATMENT    SUBJECTIVE: Pt reports improvement in her symptoms since starting with therapy. No significant episodes of vertigo, dizziness, or wooziness since the last therapy. She reports that she has noticed feeling better after sleeping on her L side. She has been able to get up in the night without any symptoms. Symptoms have not completely resolved and are still complicated by issues with her vision.   Canalith Repositioning Treatment All testing and maneuvers performed on inverted mat table and with as much body rolling as feasible for positioning to minimize cervical rotation. Dix-Hallpike is negative bilaterally for dizziness or nystagmus. In the L Dix-Hallpike position she does report some feelings like "water running in my ear." L Roll Test is negative but R Roll Test is positive for 3-5 beats of faint upbeating R torsional nystagmus. No dizziness reported. Pt treated with 2 bouts of Epley Maneuver for presumed R posterior canal BPPV. Two minute holds in each position and retesting with R Dix-Hallpike between maneuvers which is negative for vertigo and nystagmus.   PATIENT EDUCATION:  Education details: BPPV treatment Person educated: Patient Education method:  Explanation Education comprehension: verbalized understanding   HOME EXERCISE PROGRAM:  None currently    ASSESSMENT:  CLINICAL IMPRESSION: All testing and maneuvers performed on inverted mat table and with as much body rolling as feasible for positioning to minimize cervical rotation. Dix-Hallpike is negative bilaterally for dizziness or nystagmus. In the L Dix-Hallpike position she does report some feelings like "water running in my ear." L Roll Test is negative but R Roll Test is positive for 3-5 beats of faint upbeating R torsional nystagmus. No dizziness reported. Pt treated with 2 bouts of Epley Maneuver for presumed R posterior canal BPPV. Two minute holds in each position and retesting with R Dix-Hallpike between maneuvers which is negative for vertigo and nystagmus. After treatment pt feels "weird" but has difficulty fully describing symptoms. Vitals obtained and are WNL. Pt spent an additional approximately 15 minutes in the waiting room with a cup of water and eventually is able to leave once she is feeling better. Will repeat testing at next follow-up visit and treat as indicated. Once clear will proceed to additional vestibular/oculomotor and balance testing. Patient will benefit from skilled PT to address above impairments and improve overall function.  REHAB POTENTIAL: Excellent  CLINICAL DECISION MAKING: Evolving/moderate complexity  EVALUATION COMPLEXITY: Moderate   GOALS: Goals reviewed with patient? No  SHORT TERM GOALS: Target date: 05/13/2022  Pt will be independent with HEP for dizziness in order to decrease symptoms, improve balance,decrease fall risk, and improve function at home. Baseline: Goal status: INITIAL   LONG TERM GOALS: Target date: 06/10/2022  Pt will increase FOTO to at least 59 in order to demonstrate significant improvement in function at home related to dizziness.  Baseline: 04/15/22: To be completed; 04/23/22: 39 Goal status: INITIAL  2.  Pt  will improve ABC by at least 13% in order to demonstrate clinically significant improvement in balance confidence.      Baseline: 04/15/22: To be completed Goal status: INITIAL  3. Pt will report at least 90% improvement in her dizziness symptoms in order to improve her ability to safely walk, play with her grandchildren, and help her older friends at  church.      Baseline:  Goal status: INITIAL   PLAN:  PT FREQUENCY: 1-2x/week  PT DURATION: 8 weeks  PLANNED INTERVENTIONS: Therapeutic exercises, Therapeutic activity, Neuromuscular re-education, Balance training, Gait training, Patient/Family education, Joint manipulation, Joint mobilization, Canalith repositioning, Aquatic Therapy, Dry Needling, Cognitive remediation, Electrical stimulation, Spinal manipulation, Spinal mobilization, Cryotherapy, Moist heat, Traction, Ultrasound, Ionotophoresis '4mg'$ /ml Dexamethasone, and Manual therapy  PLAN FOR NEXT SESSION: BPPV testing, CRT as needed once clear perform fixations suppression oculomotor/vestibular testing as well as balance/strength testing.   Lyndel Safe Genny Caulder PT, DPT, GCS  Rashiya Lofland 05/01/2022, 8:39 AM

## 2022-05-02 ENCOUNTER — Ambulatory Visit: Payer: PPO

## 2022-05-02 DIAGNOSIS — M6281 Muscle weakness (generalized): Secondary | ICD-10-CM

## 2022-05-02 DIAGNOSIS — R42 Dizziness and giddiness: Secondary | ICD-10-CM | POA: Diagnosis not present

## 2022-05-02 NOTE — Therapy (Signed)
OUTPATIENT PHYSICAL THERAPY VESTIBULAR TREATMENT   Patient Name: Lynn Mann MRN: [id number] DOB:11-01-1950, 71 y.o., female Today's Date: 05/03/2022  PCP: Einar Pheasant, MD REFERRING PROVIDER: Einar Pheasant, MD   PT End of Session - 05/02/22 1451     Visit Number 4    Number of Visits 9    Date for PT Re-Evaluation 06/10/22    Authorization Type eval: 04/15/22    PT Start Time 1448    PT Stop Time 1530    PT Time Calculation (min) 42 min    Activity Tolerance Patient tolerated treatment well    Behavior During Therapy [facility] for tasks assessed/performed            Past Medical History:  Diagnosis Date   Arthritis    C. difficile diarrhea    age 56s-40s    Chicken pox    Cholecystitis 11/2011   Did not require sgy - Dr. Staci Acosta - [facility]  (cholelithiasis)   H/O Clostridium difficile infection    IBS (irritable bowel syndrome)    MRSA exposure 2005   Spider bite   MVP (mitral valve prolapse)    Stable - Dr. Ubaldo Glassing   Rheumatic fever    Past Surgical History:  Procedure Laterality Date   CATARACT EXTRACTION  [id number]   MUSCLE BIOPSY     Patient Active Problem List   Diagnosis Date Noted   Nocturia 03/12/2022   Snoring 03/12/2022   Muscle weakness 03/11/2022   Joint ache 02/11/2022   Ankle pain 02/05/2022   Fever 12/26/2021   Abdominal pain 12/26/2021   Cough 09/07/2021   Right shoulder pain 07/15/2021   Hematoma 02/11/2021   Ankle swelling 11/26/2020   Low back pain 11/22/2020   Hip pain, right 11/22/2020   Light headedness 06/24/2020   Hearing loss 06/24/2020   Binocular vision disorder with diplopia 06/24/2020   Stress 05/17/2020   Chest pain 05/16/2020   Welcome to Medicare preventive visit 06/20/2019   Viral syndrome 01/30/2019   Itching 07/21/2017   Asthma 07/16/2016   Urinary incontinence 07/16/2016   SOB (shortness of breath) 04/01/2016   Bilateral shoulder pain 02/24/2016   Fatigue 01/28/2016   Neck fullness 01/28/2016   Routine  general medical examination at a health care facility 07/25/2015   Health care maintenance 10/09/2014   Neck pain 11/26/2013   Hypercholesterolemia 11/26/2013   History of colonic polyps 05/11/2013   Hyperbilirubinemia 01/18/2013   GERD (gastroesophageal reflux disease) 12/03/2012   Cholelithiasis 11/07/2012   IBS (irritable bowel syndrome) 11/07/2012   History of rheumatic fever 08/28/2012   MVP (mitral valve prolapse) 08/28/2012    PCP: Einar Pheasant, MD  REFERRING PROVIDER: Einar Pheasant, MD  REFERRING DIAGNOSIS: R42 (ICD-10-CM) - Dizziness  THERAPY DIAG: Dizziness and giddiness  Muscle weakness (generalized)  RATIONALE FOR EVALUATION AND TREATMENT: Rehabilitation  ONSET DATE: 09/26/21 (approximate)  FOLLOW UP APPT WITH PROVIDER: Not asked   FROM INITIAL EVALUATION (04/15/22)  SUBJECTIVE:   Chief Complaint:  Dizziness  Pertinent History Pt reports that sometime in March/April she woke up with vertigo. Her symptoms lasted the rest of the day. She eventually scheduled with [facility] where they performed the Epley Maneuver which resolved her vertigo. However, pt reports that she still has persistent "spatial disorientation." At some point after the initial vertigo onset pt started to notice double vision. She went to see her eye doctor and eventually ended up with a referral to neurology. She had a negative MRI Brain without contrast for any  brainstem lesion and negative Myasthenia Gravis labs. She reports that neurology mentioned a referral to neurophthalmology but she never received a referral. She has also seen cardiology to work up her symptoms. Holter monitor revealed an average rate of 68. There were no prolonged pauses. Her heart rate gets in the upper 30s during sleeping hours and as high as 140 during activity. There are no pauses or significant bradycardic episodes during waking hours. Sleep study which showed no significant sleep apnea. She underwent exercise  stress echo where she went just over 4 minutes with no arrhythmia. Echo 11/21 images showed normal resting LV function with what appeared to be augmentation of all segments of the cardiac myocardium with stress. No significant valvular normalities. This did not suggest significant ischemia. She denies any further vertigo but notices that when she rolls over she "feels like she is sinking." She recently finished pelvic floor therapy. She was seen in the past by this therapist for R hip/groin pain. She has also done vestibular therapy in the past for dizziness/imbalance. Patient's husband passed unexpectedly 03/26/20 but pt reports that she is currently doing well without any significant stress.  Brain MRI: 11/11/21 IMPRESSION: No evidence of acute intracranial abnormality.   Mild chronic small vessel ischemic changes within the cerebral white matter, stable from the prior brain MRI of 09/14/2020.  MRA Head: 01/05/22 IMPRESSION: 1. No intracranial large vessel occlusion or proximal high-grade arterial stenosis. 2. 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 A CTA of the head may be helpful for further evaluation.  Description of dizziness: unsteadiness and visual disorientation ("room looks glassy/fish-bowly") Frequency: "mostly constant." Pt states that it does change with her environment Duration: "mostly constant" Symptom nature: constant Progression of symptoms since onset: no change History of similar episodes: Yes, pt reports that years ago she was told that she had an issue with her eye tracking but it never created any issues.   Auditory complaints (tinnitus, pain, drainage, hearing loss, aural fullness): Yes, slight tinnitus bilaterally. Pt reports some "stuffiness" in her ears the last couple days Vision changes (diplopia, visual field loss, recent changes, recent eye exam): Yes, pt complaining of double vision Chest  pain/palpitations: No History of head injury/concussion: No Stress/anxiety: No Headaches/migraines: Prior history of migraines but after her dtr was born she hasn't had headaches  Has patient fallen in last 6 months? 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 Positive: Reports getting hot at night  PRECAUTIONS: None  WEIGHT BEARING RESTRICTIONS No  LIVING ENVIRONMENT: Lives with: lives alone Lives in: House/apartment Stairs: 1 step to enter with a door handle she can hold   PATIENT GOALS: Improve confidence with walking so she can walk more, improve her ability to play with her grandchildren, improve stability so she can help her older friends;   OBJECTIVE EXAMINATION   NEUROLOGICAL SCREEN: (2+ unless otherwise noted.) N=normal  Ab=abnormal  Level Dermatome R L Myotome R L Reflex R L  C3 Anterior Neck N N Sidebend C2-3 N N Jaw CN V    C4 Top of Shoulder N N Shoulder Shrug  C4 N N Hoffman's UMN    C5 Lateral Upper Arm N N Shoulder ABD C4-5 N N Biceps C5-6    C6 Lateral Arm/ Thumb N N Arm Flex/ Wrist Ext C5-6 N N Brachiorad. C5-6    C7 Middle Finger N N Arm Ext//Wrist Flex C6-7 N N Triceps C7    C8 4th & 5th Finger N N Flex/ Ext Carpi Ulnaris C8 N N Patellar (L3-4)    T1 Medial Arm N N Interossei T1 N N Gastrocnemius    L2 Medial thigh/groin N N Illiopsoas (L2-3) N N     L3 Lower thigh/med.knee N N Quadriceps (L3-4) N N     L4 Medial leg/lat thigh N N Tibialis Ant (L4-5) N N     L5 Lat. leg & dorsal foot N N EHL (L5) N N     S1 post/lat foot/thigh/leg N N Gastrocnemius (S1-2) N N      S2 Post./med. thigh & leg N N Hamstrings (L4-S3) N N       CRANIAL NERVES II, III, IV, VI: Pupils equal and reactive to light, visual acuity and visual fields are intact, extraocular muscles are intact  V: Facial sensation is intact and symmetric bilaterally  VII: Facial strength is intact and symmetric bilaterally  VIII: Hearing is normal as tested by gross conversation IX, X: Palate elevates midline, normal phonation, uvula midline XI: Shoulder shrug strength is intact  XII: Tongue protrudes midline   SOMATOSENSORY Grossly intact to light touch bilateral UEs/LEs as determined by testing dermatomes C2-T2 and L2-S2. Proprioception and hot/cold testing deferred on this date.  COORDINATION Finger to Nose: Normal Heel to Shin: Normal Pronator Drift: Negative Rapid Alternating Movements: Normal Finger to Thumb Opposition: Normal   RANGE OF MOTION Cervical Spine AROM grossly WFL and painless in all directions. No focal deficits in AROM noted in [place] strength WNL without focal deficits with the exception of 4/5 bilateral ankle dorsiflexion and 4/5 R shoulder flexion  TRANSFERS/GAIT Independent for transfers and ambulation without assistive device   PATIENT SURVEYS FOTO: To be completed (pt arrived late so unable to complete)  ABC: To be completed  Clinical Test of Sensory Interaction for Balance (CTSIB): Deferred   OCULOMOTOR / VESTIBULAR TESTING  Oculomotor Exam- Room Light  Findings Comments  Ocular Alignment normal   Ocular ROM normal   Spontaneous Nystagmus normal   Gaze-Holding Nystagmus normal   End-Gaze Nystagmus normal   Vergence (normal 2-3") abnormal Pt reporting blurriness >24" from her nose  Smooth Pursuit abnormal Saccadic intrusions noted  Cross-Cover Test normal   Saccades normal   VOR Cancellation normal   Left Head Impulse normal   Right Head Impulse abnormal Corrective saccade required  Static Acuity not examined    Dynamic Acuity not examined     Oculomotor Exam- Fixation Suppressed: Deferred  BPPV TESTS:  Symptoms Duration Intensity Nystagmus  L Dix-Hallpike Dizziness and visual disorientation   Downbeating  R Dix-Hallpike Visual disorientation "things are out of place"   None  L Head Roll Mild nausea   None  R Head Roll Nausea, spinning   After approximately 20 seconds pt starts with vigorous upbeating R torsional nystagmus x 5-7 seconds;  L Sidelying Test      R Sidelying Test      (blank = not tested)  FUNCTIONAL OUTCOME MEASURES   Results Comments  BERG    DGI    FGA    TUG    5TSTS  6 Minute Walk Test    10 Meter Gait Speed    ABC Scale    DHI    (blank = not tested)   TODAY'S TREATMENT    SUBJECTIVE: Pt reports less severity of symptoms since starting with therapy. No significant episodes of vertigo, dizziness, or wooziness since the last therapy. She did get out of bed quickly this morning and after a few seconds felt some faint symptoms.   Canalith Repositioning Treatment All testing and maneuvers performed on inverted mat table and with as much body rolling as feasible for positioning to minimize cervical rotation. Dix-Hallpike is negative bilaterally for dizziness or nystagmus. L Roll Test is negative but R Roll Test is positive for 2-4 beats of faint R torsional nystagmus. No dizziness reported but pt reports some mild nausea. Given prior positive testing on the right side pt treated with 1 bout of Epley Maneuver for presumed R posterior canal BPPV. Two minute holds in each position.    Neuromuscular Re-education   Oculomotor Exam- Fixation Suppressed  Findings Comments  Ocular Alignment normal   Spontaneous Nystagmus normal   Gaze-Holding Nystagmus normal   End-Gaze Nystagmus normal   Head Shaking Nystagmus not examined   Pressure-Induced Nystagmus not examined   Hyperventilation Induced Nystagmus not examined   Skull Vibration Induced Nystagmus not examined      BERG: 54/56 DGI: 22/24 5TSTS: 34.7s   PATIENT EDUCATION:  Education details: BPPV treatment Person educated: Patient Education method: Explanation Education comprehension: verbalized understanding   HOME EXERCISE PROGRAM:  None currently    ASSESSMENT:  CLINICAL IMPRESSION: AAll testing and maneuvers performed on inverted mat table and with as much body rolling as feasible for positioning to minimize cervical rotation. Dix-Hallpike is negative bilaterally for dizziness or nystagmus. L Roll Test is negative but R Roll Test is positive for 2-4 beats of faint R torsional nystagmus. No dizziness reported but pt reports some mild nausea. Given prior positive testing on the right side pt treated with 1 bout of Epley Maneuver for presumed R posterior canal BPPV. Two minute holds in each position. After canalith repositioning treatment performed oculomotor/vestibular testing with fixation suppression which is WNL. Also performed additional balance and strength testing. Only very mild balance deficits noted with pt scoring 22/24 on the DGI and 54/56 on the BERG. She does have some LE weakness as noted by 5TSTS of 34.7s. Therapist will incorporate additional balance and strength exercises into future sessions. Pt would also like to add some eye exercises into her program so will add smooth pursuit, saccade, and convergence/divergence training into future sessions. Patient will benefit from skilled PT to address above impairments and improve overall function.  REHAB POTENTIAL: Excellent  CLINICAL DECISION MAKING: Evolving/moderate complexity  EVALUATION COMPLEXITY: Moderate   GOALS: Goals reviewed with patient? No  SHORT TERM GOALS: Target date: 05/13/2022  Pt will be independent with HEP for dizziness in order to decrease symptoms, improve balance,decrease fall risk, and improve function at home. Baseline: Goal status: INITIAL   LONG TERM GOALS: Target date: 06/10/2022  Pt will  increase FOTO to at least 59 in order to demonstrate significant improvement in function at home related to dizziness.  Baseline: 04/15/22: To be completed; 04/23/22: 39 Goal status: INITIAL  2.  Pt will improve ABC by at least 13% in order to demonstrate clinically significant improvement in balance confidence.      Baseline: 04/15/22: To be completed Goal status: INITIAL  3. Pt will report at least 90% improvement  in her dizziness symptoms in order to improve her ability to safely walk, play with her grandchildren, and help her older friends at church.      Baseline:  Goal status: INITIAL   PLAN:  PT FREQUENCY: 1-2x/week  PT DURATION: 8 weeks  PLANNED INTERVENTIONS: Therapeutic exercises, Therapeutic activity, Neuromuscular re-education, Balance training, Gait training, Patient/Family education, Joint manipulation, Joint mobilization, Canalith repositioning, Aquatic Therapy, Dry Needling, Cognitive remediation, Electrical stimulation, Spinal manipulation, Spinal mobilization, Cryotherapy, Moist heat, Traction, Ultrasound, Ionotophoresis '4mg'$ /ml Dexamethasone, and Manual therapy  PLAN FOR NEXT SESSION: BPPV testing, CRT as needed, add eye exercises (smooth pursuit, saccades, and convergence/divergence);   Lyndel Safe Naome Brigandi PT, DPT, GCS  Nyeshia Mysliwiec 05/03/2022, 10:09 AM

## 2022-05-07 ENCOUNTER — Telehealth: Payer: Self-pay

## 2022-05-08 ENCOUNTER — Other Ambulatory Visit: Payer: Self-pay | Admitting: Internal Medicine

## 2022-05-08 ENCOUNTER — Ambulatory Visit (INDEPENDENT_AMBULATORY_CARE_PROVIDER_SITE_OTHER): Payer: PPO | Admitting: Internal Medicine

## 2022-05-08 ENCOUNTER — Encounter: Payer: Self-pay | Admitting: Internal Medicine

## 2022-05-08 VITALS — BP 117/80 | Temp 97.0°F | Ht 67.0 in | Wt 173.0 lb

## 2022-05-08 DIAGNOSIS — R197 Diarrhea, unspecified: Secondary | ICD-10-CM

## 2022-05-08 DIAGNOSIS — H9202 Otalgia, left ear: Secondary | ICD-10-CM | POA: Diagnosis not present

## 2022-05-08 DIAGNOSIS — R0989 Other specified symptoms and signs involving the circulatory and respiratory systems: Secondary | ICD-10-CM | POA: Diagnosis not present

## 2022-05-08 DIAGNOSIS — Z20822 Contact with and (suspected) exposure to covid-19: Secondary | ICD-10-CM | POA: Diagnosis not present

## 2022-05-08 DIAGNOSIS — R519 Headache, unspecified: Secondary | ICD-10-CM

## 2022-05-08 DIAGNOSIS — R112 Nausea with vomiting, unspecified: Secondary | ICD-10-CM

## 2022-05-08 MED ORDER — CIPROFLOXACIN-DEXAMETHASONE 0.3-0.1 % OT SUSP: 4.0000 [drp] | Freq: Two times a day (BID) | OTIC | 0 refills | Status: DC

## 2022-05-08 MED ORDER — CETIRIZINE HCL 10 MG PO TABS: 10.0000 mg | ORAL_TABLET | Freq: Every day | ORAL | 11 refills | Status: DC | PRN

## 2022-05-08 MED ORDER — CETIRIZINE HCL 10 MG PO TABS: 10.0000 mg | ORAL_TABLET | Freq: Every day | ORAL | 11 refills | Status: AC | PRN

## 2022-05-08 NOTE — Progress Notes (Signed)
Telephone Note  I connected with Lynn Mann   on 05/09/22 at  2:40 PM EDT by telephone and verified that I am speaking with the correct person using two identifiers.  Location patient: Lynn Mann Location provider:work or home office Persons participating in the virtual visit: patient, provider  I discussed the limitations and requested verbal permission for telemedicine visit. The patient expressed understanding and agreed to proceed.   HPI:  Acute telemedicine visit for : Monday had diarrhea, nausea, runny nose, h/a she is wearing mask took covid 19 test Monday and negative She also saw specks of blood in her ear after using a Q tip but advised not to do this any further could be due to trauma  She is concerned for covid exposure   She tries to limit going out in public during peak times but has been out to lunch in public around 11 am   Chronic dizziness f/u went and doing epley maneuver and prn meclizine   -Pertinent past medical history: see below -Pertinent medication allergies: Allergies  Allergen Reactions   Other Anaphylaxis    Artificial sweetener   Adhesive [Tape]    Tartrazine    Yellow Dyes (Non-Tartrazine) Other (See Comments)    Arthritis pains - it is tartrazine Yellow #5   -COVID-19 vaccine status:  Immunization History  Administered Date(s) Administered   Moderna Sars-Covid-2 Vaccination 10/28/2019, 11/18/2019   Tdap 07/25/2015     ROS: See pertinent positives and negatives per HPI.  Past Medical History:  Diagnosis Date   Arthritis    C. difficile diarrhea    age 74s-40s    Chicken pox    Cholecystitis 11/2011   Did not require sgy - Dr. Staci Acosta - [facility]  (cholelithiasis)   H/O Clostridium difficile infection    IBS (irritable bowel syndrome)    MRSA exposure 2005   Spider bite   MVP (mitral valve prolapse)    Stable - Dr. Ubaldo Glassing   Rheumatic fever     Past Surgical History:  Procedure Laterality Date   CATARACT EXTRACTION  [id number]   MUSCLE  BIOPSY       Current Outpatient Medications:    albuterol (VENTOLIN HFA) 108 (90 Base) MCG/ACT inhaler, Inhale 2 puffs into the lungs every 6 (six) hours as needed., Disp: , Rfl:    Cholecalciferol (VITAMIN D3 PO), Take by mouth., Disp: , Rfl:    ciprofloxacin-dexamethasone (CIPRODEX) OTIC suspension, Place 4 drops into the left ear 2 (two) times daily. X4-7 days, Disp: 7.5 mL, Rfl: 0   fluticasone (FLONASE) 50 MCG/ACT nasal spray, Place 2 sprays into both nostrils daily. prn, Disp: 16 g, Rfl: 11   mirabegron ER (MYRBETRIQ) 50 MG TB24 tablet, Take 1 tablet (50 mg total) by mouth daily., Disp: 60 tablet, Rfl: 0   rosuvastatin (CRESTOR) 5 MG tablet, Take 1 tablet (5 mg total) by mouth daily., Disp: 30 tablet, Rfl: 2   aspirin 325 MG tablet, Take by mouth. (Patient not taking: Reported on 05/08/2022), Disp: , Rfl:    b complex vitamins capsule, Take 1 capsule by mouth daily. (Patient not taking: Reported on 05/08/2022), Disp: , Rfl:    CALCIUM PO, Take by mouth. (Patient not taking: Reported on 05/08/2022), Disp: , Rfl:    cetirizine (ZYRTEC) 10 MG tablet, Take 1 tablet (10 mg total) by mouth daily as needed for allergies., Disp: 30 tablet, Rfl: 11   cyclobenzaprine (FLEXERIL) 5 MG tablet, Take 1 tablet (5 mg total) by mouth 3 (three) times  daily as needed. (Patient not taking: Reported on 05/08/2022), Disp: 15 tablet, Rfl: 0   meclizine (ANTIVERT) 25 MG tablet, Take 0.5-1 tablets (12.5-25 mg total) by mouth 2 (two) times daily as needed for dizziness. (Patient not taking: Reported on 05/08/2022), Disp: 60 tablet, Rfl: 2   Multiple Vitamins-Minerals (PRESERVISION AREDS 2 PO), Take by mouth. (Patient not taking: Reported on 05/08/2022), Disp: , Rfl:   EXAM:  VITALS per patient if applicable:  GENERAL: alert, oriented, appears well and in no acute distress  PSYCH/NEURO: pleasant and cooperative, no obvious depression or anxiety, speech and thought processing grossly intact  ASSESSMENT AND  PLAN:  Discussed the following assessment and plan:  Nonintractable headache, runny nose, n/v/d r/o covid- Plan: Novel Coronavirus, NAA ([facility]) Prn tylenol Prn cetirizine (ZYRTEC) 10 MG tablet,  BRAT diet  She has prn meclizine using for dizziness chronic and flonase she will use for runny nose declines Atrovent for now   Otalgia, left Avoid Q tips for now ciprodex  -we discussed possible serious and likely etiologies, options for evaluation and workup, limitations of telemedicine visit vs in person visit, treatment, treatment risks and precautions. Pt is agreeable to treatment via telemedicine at this moment.     I discussed the assessment and treatment plan with the patient. The patient was provided an opportunity to ask questions and all were answered. The patient agreed with the plan and demonstrated an understanding of the instructions.    Time spent 20 min Delorise Jackson, MD

## 2022-05-09 ENCOUNTER — Ambulatory Visit: Payer: PPO

## 2022-05-09 ENCOUNTER — Telehealth: Payer: Self-pay

## 2022-05-09 DIAGNOSIS — E7800 Pure hypercholesterolemia, unspecified: Secondary | ICD-10-CM

## 2022-05-09 DIAGNOSIS — M6281 Muscle weakness (generalized): Secondary | ICD-10-CM

## 2022-05-09 DIAGNOSIS — R42 Dizziness and giddiness: Secondary | ICD-10-CM

## 2022-05-09 MED ORDER — NEOMYCIN-POLYMYXIN-HC 3.5-10000-1 OT SOLN: 4.0000 [drp] | Freq: Three times a day (TID) | OTIC | 0 refills | Status: DC

## 2022-05-09 NOTE — Telephone Encounter (Signed)
Pt has been informed that we have to wait on the results. I informed pt its best to just wait or if she can not to make sure she wears a mask and be safe. Pt stated she will be by to get the Good Rx cards to help with the cost of the medication

## 2022-05-09 NOTE — Telephone Encounter (Signed)
Can try neomycin ear drops instead of ciprodex

## 2022-05-09 NOTE — Addendum Note (Signed)
Addended by: Orland Mustard on: 05/09/2022 03:47 PM   Modules accepted: Orders

## 2022-05-09 NOTE — Telephone Encounter (Signed)
Patient states Dr. Olivia Mackie McLean-Scocuzza prescribed ciprofloxacin-dexamethasone (CIPRODEX) OTIC suspension for her and when she went to pick it up, it was really expensive.  Patient states it was $100 after insurance and the original cost was $211.99.  Patient states she would like to know if we have any coupons from the manufacturers to decrease the price.  I did let patient know that we have the [place] cards in our lobby and she is welcome to come get one of those.  Patient states that she had her covid test with [place] at about 4:10pm yesterday and she would like to know when we will have the results back.  Patient would like for [place] to please let her know.  Patient states she noticed that MyChart states she was exposed to covid, but patient states that she was not exposed to covid.

## 2022-05-09 NOTE — Telephone Encounter (Signed)
Patient states she is planning to leave at 6pm this evening to go see her grandchildren, so she was hoping to have the covid results before that time.

## 2022-05-10 ENCOUNTER — Telehealth: Payer: Self-pay

## 2022-05-10 LAB — SPECIMEN STATUS REPORT

## 2022-05-10 LAB — NOVEL CORONAVIRUS, NAA: SARS-CoV-2, NAA: NOT DETECTED

## 2022-05-10 NOTE — Telephone Encounter (Signed)
LMOM for pt to CB in regards to what Drt. Olivia Mackie stated we could try instead of the expensive medication:   McLean-Scocuzza, Nino Glow, MD  You 18 hours ago (3:48 PM)   TM Can try neomycin ear drops instead of ciprodex

## 2022-05-10 NOTE — Telephone Encounter (Signed)
Pt called and I read the message to her and she would like to be called in regards to the medication. Pt also want to know about her covid test results.

## 2022-05-10 NOTE — Telephone Encounter (Signed)
LMOM for pt to CB.  

## 2022-05-10 NOTE — Telephone Encounter (Signed)
Pt has been informed and has picked up neomycin

## 2022-05-14 ENCOUNTER — Ambulatory Visit: Payer: PPO

## 2022-05-14 DIAGNOSIS — R42 Dizziness and giddiness: Secondary | ICD-10-CM | POA: Diagnosis not present

## 2022-05-14 DIAGNOSIS — M6281 Muscle weakness (generalized): Secondary | ICD-10-CM

## 2022-05-14 NOTE — Therapy (Unsigned)
OUTPATIENT PHYSICAL THERAPY VESTIBULAR TREATMENT  Patient Name: ZAINA JENKIN MRN: [id number] DOB:1950/08/02, 71 y.o., female Today's Date: 05/15/2022  PCP: Einar Pheasant, MD REFERRING PROVIDER: Einar Pheasant, MD   PT End of Session - 05/14/22 1408     Visit Number 5    Number of Visits 9    Date for PT Re-Evaluation 06/10/22    Authorization Type eval: 04/15/22    PT Start Time 1405    PT Stop Time 1445    PT Time Calculation (min) 40 min    Activity Tolerance Patient tolerated treatment well    Behavior During Therapy [facility] for tasks assessed/performed             Past Medical History:  Diagnosis Date   Arthritis    C. difficile diarrhea    age 23s-40s    Chicken pox    Cholecystitis 11/2011   Did not require sgy - Dr. Staci Acosta - [facility]  (cholelithiasis)   H/O Clostridium difficile infection    IBS (irritable bowel syndrome)    MRSA exposure 2005   Spider bite   MVP (mitral valve prolapse)    Stable - Dr. Ubaldo Glassing   Rheumatic fever    Past Surgical History:  Procedure Laterality Date   CATARACT EXTRACTION  [id number]   MUSCLE BIOPSY     Patient Active Problem List   Diagnosis Date Noted   Nocturia 03/12/2022   Snoring 03/12/2022   Muscle weakness 03/11/2022   Joint ache 02/11/2022   Ankle pain 02/05/2022   Fever 12/26/2021   Abdominal pain 12/26/2021   Cough 09/07/2021   Right shoulder pain 07/15/2021   Hematoma 02/11/2021   Ankle swelling 11/26/2020   Low back pain 11/22/2020   Hip pain, right 11/22/2020   Light headedness 06/24/2020   Hearing loss 06/24/2020   Binocular vision disorder with diplopia 06/24/2020   Stress 05/17/2020   Chest pain 05/16/2020   Welcome to Medicare preventive visit 06/20/2019   Viral syndrome 01/30/2019   Itching 07/21/2017   Asthma 07/16/2016   Urinary incontinence 07/16/2016   SOB (shortness of breath) 04/01/2016   Bilateral shoulder pain 02/24/2016   Fatigue 01/28/2016   Neck fullness 01/28/2016   Routine  general medical examination at a health care facility 07/25/2015   Health care maintenance 10/09/2014   Neck pain 11/26/2013   Hypercholesterolemia 11/26/2013   History of colonic polyps 05/11/2013   Hyperbilirubinemia 01/18/2013   GERD (gastroesophageal reflux disease) 12/03/2012   Cholelithiasis 11/07/2012   IBS (irritable bowel syndrome) 11/07/2012   History of rheumatic fever 08/28/2012   MVP (mitral valve prolapse) 08/28/2012    PCP: Einar Pheasant, MD  REFERRING PROVIDER: Einar Pheasant, MD  REFERRING DIAGNOSIS: R42 (ICD-10-CM) - Dizziness  THERAPY DIAG: Dizziness and giddiness  Muscle weakness (generalized)  RATIONALE FOR EVALUATION AND TREATMENT: Rehabilitation  ONSET DATE: 09/26/21 (approximate)  FOLLOW UP APPT WITH PROVIDER: Not asked   FROM INITIAL EVALUATION (04/15/22)  SUBJECTIVE:   Chief Complaint:  Dizziness  Pertinent History Pt reports that sometime in March/April she woke up with vertigo. Her symptoms lasted the rest of the day. She eventually scheduled with [facility] where they performed the Epley Maneuver which resolved her vertigo. However, pt reports that she still has persistent "spatial disorientation." At some point after the initial vertigo onset pt started to notice double vision. She went to see her eye doctor and eventually ended up with a referral to neurology. She had a negative MRI Brain without contrast for any  brainstem lesion and negative Myasthenia Gravis labs. She reports that neurology mentioned a referral to neurophthalmology but she never received a referral. She has also seen cardiology to work up her symptoms. Holter monitor revealed an average rate of 68. There were no prolonged pauses. Her heart rate gets in the upper 30s during sleeping hours and as high as 140 during activity. There are no pauses or significant bradycardic episodes during waking hours. Sleep study which showed no significant sleep apnea. She underwent exercise  stress echo where she went just over 4 minutes with no arrhythmia. Echo 11/21 images showed normal resting LV function with what appeared to be augmentation of all segments of the cardiac myocardium with stress. No significant valvular normalities. This did not suggest significant ischemia. She denies any further vertigo but notices that when she rolls over she "feels like she is sinking." She recently finished pelvic floor therapy. She was seen in the past by this therapist for R hip/groin pain. She has also done vestibular therapy in the past for dizziness/imbalance. Patient's husband passed unexpectedly 03/26/20 but pt reports that she is currently doing well without any significant stress.  Brain MRI: 11/11/21 IMPRESSION: No evidence of acute intracranial abnormality.   Mild chronic small vessel ischemic changes within the cerebral white matter, stable from the prior brain MRI of 09/14/2020.  MRA Head: 01/05/22 IMPRESSION: 1. No intracranial large vessel occlusion or proximal high-grade arterial stenosis. 2. 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 A CTA of the head may be helpful for further evaluation.  Description of dizziness: unsteadiness and visual disorientation ("room looks glassy/fish-bowly") Frequency: "mostly constant." Pt states that it does change with her environment Duration: "mostly constant" Symptom nature: constant Progression of symptoms since onset: no change History of similar episodes: Yes, pt reports that years ago she was told that she had an issue with her eye tracking but it never created any issues.   Auditory complaints (tinnitus, pain, drainage, hearing loss, aural fullness): Yes, slight tinnitus bilaterally. Pt reports some "stuffiness" in her ears the last couple days Vision changes (diplopia, visual field loss, recent changes, recent eye exam): Yes, pt complaining of double vision Chest  pain/palpitations: No History of head injury/concussion: No Stress/anxiety: No Headaches/migraines: Prior history of migraines but after her dtr was born she hasn't had headaches  Has patient fallen in last 6 months? 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 Positive: Reports getting hot at night  PRECAUTIONS: None  WEIGHT BEARING RESTRICTIONS No  LIVING ENVIRONMENT: Lives with: lives alone Lives in: House/apartment Stairs: 1 step to enter with a door handle she can hold   PATIENT GOALS: Improve confidence with walking so she can walk more, improve her ability to play with her grandchildren, improve stability so she can help her older friends;   OBJECTIVE EXAMINATION   NEUROLOGICAL SCREEN: (2+ unless otherwise noted.) N=normal  Ab=abnormal  Level Dermatome R L Myotome R L Reflex R L  C3 Anterior Neck N N Sidebend C2-3 N N Jaw CN V    C4 Top of Shoulder N N Shoulder Shrug  C4 N N Hoffman's UMN    C5 Lateral Upper Arm N N Shoulder ABD C4-5 N N Biceps C5-6    C6 Lateral Arm/ Thumb N N Arm Flex/ Wrist Ext C5-6 N N Brachiorad. C5-6    C7 Middle Finger N N Arm Ext//Wrist Flex C6-7 N N Triceps C7    C8 4th & 5th Finger N N Flex/ Ext Carpi Ulnaris C8 N N Patellar (L3-4)    T1 Medial Arm N N Interossei T1 N N Gastrocnemius    L2 Medial thigh/groin N N Illiopsoas (L2-3) N N     L3 Lower thigh/med.knee N N Quadriceps (L3-4) N N     L4 Medial leg/lat thigh N N Tibialis Ant (L4-5) N N     L5 Lat. leg & dorsal foot N N EHL (L5) N N     S1 post/lat foot/thigh/leg N N Gastrocnemius (S1-2) N N      S2 Post./med. thigh & leg N N Hamstrings (L4-S3) N N       CRANIAL NERVES II, III, IV, VI: Pupils equal and reactive to light, visual acuity and visual fields are intact, extraocular muscles are intact  V: Facial sensation is intact and symmetric bilaterally  VII: Facial strength is intact and symmetric bilaterally  VIII: Hearing is normal as tested by gross conversation IX, X: Palate elevates midline, normal phonation, uvula midline XI: Shoulder shrug strength is intact  XII: Tongue protrudes midline   SOMATOSENSORY Grossly intact to light touch bilateral UEs/LEs as determined by testing dermatomes C2-T2 and L2-S2. Proprioception and hot/cold testing deferred on this date.  COORDINATION Finger to Nose: Normal Heel to Shin: Normal Pronator Drift: Negative Rapid Alternating Movements: Normal Finger to Thumb Opposition: Normal   RANGE OF MOTION Cervical Spine AROM grossly WFL and painless in all directions. No focal deficits in AROM noted in Russell strength WNL without focal deficits with the exception of 4/5 bilateral ankle dorsiflexion and 4/5 R shoulder flexion  TRANSFERS/GAIT Independent for transfers and ambulation without assistive device   PATIENT SURVEYS FOTO: To be completed (pt arrived late so unable to complete)  ABC: To be completed  Clinical Test of Sensory Interaction for Balance (CTSIB): Deferred   OCULOMOTOR / VESTIBULAR TESTING  Oculomotor Exam- Room Light  Findings Comments  Ocular Alignment normal   Ocular ROM normal   Spontaneous Nystagmus normal   Gaze-Holding Nystagmus normal   End-Gaze Nystagmus normal   Vergence (normal 2-3") abnormal Pt reporting blurriness >24" from her nose  Smooth Pursuit abnormal Saccadic intrusions noted  Cross-Cover Test normal   Saccades normal   VOR Cancellation normal   Left Head Impulse normal   Right Head Impulse abnormal Corrective saccade required  Static Acuity not examined    Dynamic Acuity not examined     Oculomotor Exam- Fixation Suppressed: Deferred  BPPV TESTS:  Symptoms Duration Intensity Nystagmus  L Dix-Hallpike Dizziness and visual disorientation   Downbeating  R Dix-Hallpike Visual disorientation "things are out of place"   None  L Head Roll Mild nausea   None  R Head Roll Nausea, spinning   After approximately 20 seconds pt starts with vigorous upbeating R torsional nystagmus x 5-7 seconds;  L Sidelying Test      R Sidelying Test      (blank = not tested)  FUNCTIONAL OUTCOME MEASURES   Results Comments  BERG    DGI    FGA    TUG    5TSTS  6 Minute Walk Test    10 Meter Gait Speed    ABC Scale    DHI    (blank = not tested)   TODAY'S TREATMENT    SUBJECTIVE: No significant changes in vertigo, dizziness, or wooziness since the last therapy session. She states that after BPPV testing and treatment she feels slightly whoozy and would like to defer today.    Pain: No pain reported   Neuromuscular Re-education  Interval history obtained, pt declined BPPV testing today. Seated horizontal smooth pursuit x 60s; Seated vertical smooth pursuit x 60s;  Seated diagonal smooth pursuit 2 x 60s (high right/low left and high left/low right); Seated horizontal saccades x 60s; Seated vertical saccades x 60s;  Seated diagonal saccades 2 x 60s (high right/low left and high left/low right); HEP issued with education about proper technique with exercises   PATIENT EDUCATION:  Education details: Eye exercises Person educated: Patient Education method: Explanation and handout Education comprehension: verbalized understanding and returned demonstration   HOME EXERCISE PROGRAM:  Access Code: [email or web address] URL: [email or web address] Date: 05/14/2022 Prepared by: Roxana Hires  Exercises - Seated Horizontal Smooth Pursuit  - 2-3 x daily - 7 x weekly - 1 reps - 60s hold - Seated Vertical Smooth Pursuit  - 2-3 x daily - 7 x weekly  - 1 reps - 60s hold - Seated Diagonal Smooth Pursuit  - 2-3 x daily - 7 x weekly - 1 reps - 60s hold - Seated Horizontal Saccades  - 2-3 x daily - 7 x weekly - 1 reps - 60s hold - Seated Vertical Saccades  - 2-3 x daily - 7 x weekly - 1 reps - 60s hold - Seated Diagonal Saccades  - 2-3 x daily - 7 x weekly - 1 reps - 60s hold    ASSESSMENT:  CLINICAL IMPRESSION: Pt mentioned at last visit that she would like some eye exercises. Session today focused on eye exercises including smooth pursuit and saccades. Extensive feedback provided to patient to ensure proper techniques with exercises. HEP issued and pt encouraged to follow-up as scheduled. Patient will benefit from skilled PT to address above impairments and improve overall function.  REHAB POTENTIAL: Excellent  CLINICAL DECISION MAKING: Evolving/moderate complexity  EVALUATION COMPLEXITY: Moderate   GOALS: Goals reviewed with patient? No  SHORT TERM GOALS: Target date: 05/13/2022  Pt will be independent with HEP for dizziness in order to decrease symptoms, improve balance,decrease fall risk, and improve function at home. Baseline: Goal status: INITIAL   LONG TERM GOALS: Target date: 06/10/2022  Pt will increase FOTO to at least 59 in order to demonstrate significant improvement in function at home related to dizziness.  Baseline: 04/15/22: To be completed; 04/23/22: 39 Goal status: INITIAL  2.  Pt will improve ABC by at least 13% in order to demonstrate clinically significant improvement in balance confidence.      Baseline: 04/15/22: To be completed Goal status: INITIAL  3. Pt will report at least 90% improvement in her dizziness symptoms in order to improve her ability to safely walk, play with her grandchildren, and help her older friends at church.      Baseline:  Goal status: INITIAL   PLAN:  PT FREQUENCY: 1-2x/week  PT DURATION: 8 weeks  PLANNED INTERVENTIONS: Therapeutic exercises, Therapeutic activity,  Neuromuscular re-education, Balance training, Gait training, Patient/Family education, Joint manipulation, Joint mobilization, Canalith repositioning, Aquatic Therapy, Dry Needling, Cognitive remediation, Electrical stimulation, Spinal manipulation, Spinal mobilization, Cryotherapy, Moist heat, Traction, Ultrasound, Ionotophoresis '4mg'$ /ml Dexamethasone, and  Manual therapy  PLAN FOR NEXT SESSION: BPPV testing, CRT as needed, add eye exercises (smooth pursuit, saccades, and convergence/divergence);   Lyndel Safe Skyanne Welle PT, DPT, GCS  Novalynn Branaman 05/15/2022, 1:05 PM

## 2022-05-15 NOTE — Therapy (Signed)
OUTPATIENT PHYSICAL THERAPY VESTIBULAR TREATMENT  Patient Name: Lynn Mann MRN: [id number] DOB:1951-03-21, 71 y.o., female Today's Date: 05/16/2022  PCP: Einar Pheasant, MD REFERRING PROVIDER: Einar Pheasant, MD   PT End of Session - 05/16/22 1501     Visit Number 6    Number of Visits 9    Date for PT Re-Evaluation 06/10/22    Authorization Type eval: 04/15/22    PT Start Time 8099    PT Stop Time 1530    PT Time Calculation (min) 43 min    Activity Tolerance Patient tolerated treatment well    Behavior During Therapy [facility] for tasks assessed/performed            Past Medical History:  Diagnosis Date   Arthritis    C. difficile diarrhea    age 46s-40s    Chicken pox    Cholecystitis 11/2011   Did not require sgy - Dr. Staci Acosta - [facility]  (cholelithiasis)   H/O Clostridium difficile infection    IBS (irritable bowel syndrome)    MRSA exposure 2005   Spider bite   MVP (mitral valve prolapse)    Stable - Dr. Ubaldo Glassing   Rheumatic fever    Past Surgical History:  Procedure Laterality Date   CATARACT EXTRACTION  [id number]   MUSCLE BIOPSY     Patient Active Problem List   Diagnosis Date Noted   Nocturia 03/12/2022   Snoring 03/12/2022   Muscle weakness 03/11/2022   Joint ache 02/11/2022   Ankle pain 02/05/2022   Fever 12/26/2021   Abdominal pain 12/26/2021   Cough 09/07/2021   Right shoulder pain 07/15/2021   Hematoma 02/11/2021   Ankle swelling 11/26/2020   Low back pain 11/22/2020   Hip pain, right 11/22/2020   Light headedness 06/24/2020   Hearing loss 06/24/2020   Binocular vision disorder with diplopia 06/24/2020   Stress 05/17/2020   Chest pain 05/16/2020   Welcome to Medicare preventive visit 06/20/2019   Viral syndrome 01/30/2019   Itching 07/21/2017   Asthma 07/16/2016   Urinary incontinence 07/16/2016   SOB (shortness of breath) 04/01/2016   Bilateral shoulder pain 02/24/2016   Fatigue 01/28/2016   Neck fullness 01/28/2016   Routine general  medical examination at a health care facility 07/25/2015   Health care maintenance 10/09/2014   Neck pain 11/26/2013   Hypercholesterolemia 11/26/2013   History of colonic polyps 05/11/2013   Hyperbilirubinemia 01/18/2013   GERD (gastroesophageal reflux disease) 12/03/2012   Cholelithiasis 11/07/2012   IBS (irritable bowel syndrome) 11/07/2012   History of rheumatic fever 08/28/2012   MVP (mitral valve prolapse) 08/28/2012    PCP: Einar Pheasant, MD  REFERRING PROVIDER: Einar Pheasant, MD  REFERRING DIAGNOSIS: R42 (ICD-10-CM) - Dizziness  THERAPY DIAG: Dizziness and giddiness  Muscle weakness (generalized)  RATIONALE FOR EVALUATION AND TREATMENT: Rehabilitation  ONSET DATE: 09/26/21 (approximate)  FOLLOW UP APPT WITH PROVIDER: Not asked   FROM INITIAL EVALUATION (04/15/22)  SUBJECTIVE:   Chief Complaint:  Dizziness  Pertinent History Pt reports that sometime in March/April she woke up with vertigo. Her symptoms lasted the rest of the day. She eventually scheduled with [facility] where they performed the Epley Maneuver which resolved her vertigo. However, pt reports that she still has persistent "spatial disorientation." At some point after the initial vertigo onset pt started to notice double vision. She went to see her eye doctor and eventually ended up with a referral to neurology. She had a negative MRI Brain without contrast for any brainstem  lesion and negative Myasthenia Gravis labs. She reports that neurology mentioned a referral to neurophthalmology but she never received a referral. She has also seen cardiology to work up her symptoms. Holter monitor revealed an average rate of 68. There were no prolonged pauses. Her heart rate gets in the upper 30s during sleeping hours and as high as 140 during activity. There are no pauses or significant bradycardic episodes during waking hours. Sleep study which showed no significant sleep apnea. She underwent exercise stress echo  where she went just over 4 minutes with no arrhythmia. Echo 11/21 images showed normal resting LV function with what appeared to be augmentation of all segments of the cardiac myocardium with stress. No significant valvular normalities. This did not suggest significant ischemia. She denies any further vertigo but notices that when she rolls over she "feels like she is sinking." She recently finished pelvic floor therapy. She was seen in the past by this therapist for R hip/groin pain. She has also done vestibular therapy in the past for dizziness/imbalance. Patient's husband passed unexpectedly 03/26/20 but pt reports that she is currently doing well without any significant stress.  Brain MRI: 11/11/21 IMPRESSION: No evidence of acute intracranial abnormality.   Mild chronic small vessel ischemic changes within the cerebral white matter, stable from the prior brain MRI of 09/14/2020.  MRA Head: 01/05/22 IMPRESSION: 1. No intracranial large vessel occlusion or proximal high-grade arterial stenosis. 2. 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 A CTA of the head may be helpful for further evaluation.  Description of dizziness: unsteadiness and visual disorientation ("room looks glassy/fish-bowly") Frequency: "mostly constant." Pt states that it does change with her environment Duration: "mostly constant" Symptom nature: constant Progression of symptoms since onset: no change History of similar episodes: Yes, pt reports that years ago she was told that she had an issue with her eye tracking but it never created any issues.   Auditory complaints (tinnitus, pain, drainage, hearing loss, aural fullness): Yes, slight tinnitus bilaterally. Pt reports some "stuffiness" in her ears the last couple days Vision changes (diplopia, visual field loss, recent changes, recent eye exam): Yes, pt complaining of double vision Chest pain/palpitations:  No History of head injury/concussion: No Stress/anxiety: No Headaches/migraines: Prior history of migraines but after her dtr was born she hasn't had headaches  Has patient fallen in last 6 months? 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 Positive: Reports getting hot at night  PRECAUTIONS: None  WEIGHT BEARING RESTRICTIONS No  LIVING ENVIRONMENT: Lives with: lives alone Lives in: House/apartment Stairs: 1 step to enter with a door handle she can hold   PATIENT GOALS: Improve confidence with walking so she can walk more, improve her ability to play with her grandchildren, improve stability so she can help her older friends;   OBJECTIVE EXAMINATION   NEUROLOGICAL SCREEN: (2+ unless otherwise noted.) N=normal  Ab=abnormal  Level Dermatome R L Myotome R L Reflex R L  C3 Anterior Neck N N Sidebend C2-3 N N Jaw CN V    C4 Top of Shoulder N N Shoulder Shrug C4  N N Hoffman's UMN    C5 Lateral Upper Arm N N Shoulder ABD C4-5 N N Biceps C5-6    C6 Lateral Arm/ Thumb N N Arm Flex/ Wrist Ext C5-6 N N Brachiorad. C5-6    C7 Middle Finger N N Arm Ext//Wrist Flex C6-7 N N Triceps C7    C8 4th & 5th Finger N N Flex/ Ext Carpi Ulnaris C8 N N Patellar (L3-4)    T1 Medial Arm N N Interossei T1 N N Gastrocnemius    L2 Medial thigh/groin N N Illiopsoas (L2-3) N N     L3 Lower thigh/med.knee N N Quadriceps (L3-4) N N     L4 Medial leg/lat thigh N N Tibialis Ant (L4-5) N N     L5 Lat. leg & dorsal foot N N EHL (L5) N N     S1 post/lat foot/thigh/leg N N Gastrocnemius (S1-2) N N     S2 Post./med.  thigh & leg N N Hamstrings (L4-S3) N N       CRANIAL NERVES II, III, IV, VI: Pupils equal and reactive to light, visual acuity and visual fields are intact, extraocular muscles are intact  V: Facial sensation is intact and symmetric bilaterally  VII: Facial strength is intact and symmetric bilaterally  VIII: Hearing is normal as tested by gross conversation IX, X: Palate elevates midline, normal phonation, uvula midline XI: Shoulder shrug strength is intact  XII: Tongue protrudes midline   SOMATOSENSORY Grossly intact to light touch bilateral UEs/LEs as determined by testing dermatomes C2-T2 and L2-S2. Proprioception and hot/cold testing deferred on this date.  COORDINATION Finger to Nose: Normal Heel to Shin: Normal Pronator Drift: Negative Rapid Alternating Movements: Normal Finger to Thumb Opposition: Normal   RANGE OF MOTION Cervical Spine AROM grossly WFL and painless in all directions. No focal deficits in AROM noted in [place] strength WNL without focal deficits with the exception of 4/5 bilateral ankle dorsiflexion and 4/5 R shoulder flexion  TRANSFERS/GAIT Independent for transfers and ambulation without assistive device   PATIENT SURVEYS FOTO: To be completed (pt arrived late so unable to complete)  ABC: To be completed  Clinical Test of Sensory Interaction for Balance (CTSIB): Deferred   OCULOMOTOR / VESTIBULAR TESTING  Oculomotor Exam- Room Light  Findings Comments  Ocular Alignment normal   Ocular ROM normal   Spontaneous Nystagmus normal   Gaze-Holding Nystagmus normal   End-Gaze Nystagmus normal   Vergence (normal 2-3") abnormal Pt reporting blurriness >24" from her nose  Smooth Pursuit abnormal Saccadic intrusions noted  Cross-Cover Test normal   Saccades normal   VOR Cancellation normal   Left Head Impulse normal   Right Head Impulse abnormal Corrective saccade required  Static Acuity not examined   Dynamic Acuity  not examined     Oculomotor Exam- Fixation Suppressed: Deferred  BPPV TESTS:  Symptoms Duration Intensity Nystagmus  L Dix-Hallpike Dizziness and visual disorientation   Downbeating  R Dix-Hallpike Visual disorientation "things are out of place"   None  L Head Roll Mild nausea   None  R Head Roll Nausea, spinning   After approximately 20 seconds pt starts with vigorous upbeating R torsional nystagmus x 5-7 seconds;  L Sidelying Test      R Sidelying Test      (blank = not tested)  FUNCTIONAL OUTCOME MEASURES   Results Comments  BERG    DGI    FGA    TUG    5TSTS  6 Minute Walk Test    10 Meter Gait Speed    ABC Scale    DHI    (blank = not tested)   TODAY'S TREATMENT    SUBJECTIVE: No significant changes in vertigo, dizziness, or wooziness since the last therapy session. She reports that she is performing the HEP as issued at last visit.   Pain: No pain reported   Neuromuscular Re-education  Interval history obtained; Seated horizontal smooth pursuit x 60s; Seated vertical smooth pursuit x 60s;  Seated diagonal smooth pursuit 2 x 60s (high right/low left and high left/low right); Seated horizontal saccades x 60s; Seated vertical saccades x 60s;  Seated diagonal saccades 2 x 60s (high right/low left and high left/low right); Seated pencil push-up 2 x 60s; Seated near/far convergence 2 x 60s; Standing brock string with 3 targets at 18", 4', and 8'; HEP issued with education about proper technique with exercises   PATIENT EDUCATION:  Education details: Eye exercises Person educated: Patient Education method: Explanation and handout Education comprehension: verbalized understanding and returned demonstration   HOME EXERCISE PROGRAM:  Access Code: [email or web address] URL: [email or web address] Date: 05/16/2022 Prepared by: Roxana Hires  Exercises - Seated Horizontal Smooth Pursuit  - 2-3 x daily - 7 x weekly - 1 reps - 60s hold - Seated Vertical  Smooth Pursuit  - 2-3 x daily - 7 x weekly - 1 reps - 60s hold - Seated Diagonal Smooth Pursuit  - 2-3 x daily - 7 x weekly - 1 reps - 60s hold - Seated Horizontal Saccades  - 2-3 x daily - 7 x weekly - 1 reps - 60s hold - Seated Vertical Saccades  - 2-3 x daily - 7 x weekly - 1 reps - 60s hold - Seated Diagonal Saccades  - 2-3 x daily - 7 x weekly - 1 reps - 60s hold - Pencil Pushups  - 2-3 x daily - 7 x weekly - 1 reps - 60s hold - Brock String  - 2-3 x daily - 7 x weekly - 1 reps - 60s hold    ASSESSMENT:  CLINICAL IMPRESSION: Session today focused on eye exercises including smooth pursuit, saccades, and convergence/divergence. Feedback provided to patient to ensure proper techniques with exercises. Discussed vestibular exercises and challenges related to neck pain with head turning exercises. Updated HEP and pt encouraged to follow-up as scheduled. Patient will benefit from skilled PT to address above impairments and improve overall function.  REHAB POTENTIAL: Excellent  CLINICAL DECISION MAKING: Evolving/moderate complexity  EVALUATION COMPLEXITY: Moderate   GOALS: Goals reviewed with patient? No  SHORT TERM GOALS: Target date: 05/13/2022  Pt will be independent with HEP for dizziness in order to decrease symptoms, improve balance,decrease fall risk, and improve function at home. Baseline: Goal status: INITIAL   LONG TERM GOALS: Target date: 06/10/2022  Pt will increase FOTO to at least 59 in order to demonstrate significant improvement in function at home related to dizziness.  Baseline: 04/15/22: To be completed; 04/23/22: 39 Goal status: INITIAL  2.  Pt will improve ABC by at least 13% in order to demonstrate clinically significant improvement in balance confidence.      Baseline: 04/15/22: To be completed Goal status: INITIAL  3. Pt will report at least 90% improvement in her dizziness symptoms in order to improve her ability to safely walk, play with her  grandchildren, and help her older friends at church.      Baseline:  Goal status: INITIAL   PLAN:  PT  FREQUENCY: 1-2x/week  PT DURATION: 8 weeks  PLANNED INTERVENTIONS: Therapeutic exercises, Therapeutic activity, Neuromuscular re-education, Balance training, Gait training, Patient/Family education, Joint manipulation, Joint mobilization, Canalith repositioning, Aquatic Therapy, Dry Needling, Cognitive remediation, Electrical stimulation, Spinal manipulation, Spinal mobilization, Cryotherapy, Moist heat, Traction, Ultrasound, Ionotophoresis '4mg'$ /ml Dexamethasone, and Manual therapy  PLAN FOR NEXT SESSION: eye exercises (smooth pursuit, saccades, and convergence/divergence), consider adding vestibular exercises;   Lyndel Safe Graysyn Bache PT, DPT, GCS  Christerpher Clos 05/16/2022, 3:53 PM

## 2022-05-16 ENCOUNTER — Ambulatory Visit: Payer: PPO

## 2022-05-16 DIAGNOSIS — R42 Dizziness and giddiness: Secondary | ICD-10-CM | POA: Diagnosis not present

## 2022-05-16 DIAGNOSIS — M6281 Muscle weakness (generalized): Secondary | ICD-10-CM

## 2022-05-21 ENCOUNTER — Ambulatory Visit: Payer: PPO

## 2022-05-21 DIAGNOSIS — R42 Dizziness and giddiness: Secondary | ICD-10-CM | POA: Diagnosis not present

## 2022-05-21 DIAGNOSIS — M6281 Muscle weakness (generalized): Secondary | ICD-10-CM

## 2022-05-21 NOTE — Therapy (Signed)
OUTPATIENT PHYSICAL THERAPY VESTIBULAR TREATMENT  Patient Name: Lynn Mann MRN: [id number] DOB:[birth year], 71 y.o., female Today's Date: 05/21/2022  PCP: Einar Pheasant, MD REFERRING PROVIDER: Einar Pheasant, MD   PT End of Session - 05/21/22 1405     Visit Number 7    Number of Visits 9    Date for PT Re-Evaluation 06/10/22    Authorization Type eval: 04/15/22    PT Start Time 1404    PT Stop Time 6546    PT Time Calculation (min) 41 min    Activity Tolerance Patient tolerated treatment well    Behavior During Therapy [facility] for tasks assessed/performed             Past Medical History:  Diagnosis Date   Arthritis    C. difficile diarrhea    age 88s-40s    Chicken pox    Cholecystitis 11/2011   Did not require sgy - Dr. Staci Acosta - [facility]  (cholelithiasis)   H/O Clostridium difficile infection    IBS (irritable bowel syndrome)    MRSA exposure 2005   Spider bite   MVP (mitral valve prolapse)    Stable - Dr. Ubaldo Glassing   Rheumatic fever    Past Surgical History:  Procedure Laterality Date   CATARACT EXTRACTION  [id number]   MUSCLE BIOPSY     Patient Active Problem List   Diagnosis Date Noted   Nocturia 03/12/2022   Snoring 03/12/2022   Muscle weakness 03/11/2022   Joint ache 02/11/2022   Ankle pain 02/05/2022   Fever 12/26/2021   Abdominal pain 12/26/2021   Cough 09/07/2021   Right shoulder pain 07/15/2021   Hematoma 02/11/2021   Ankle swelling 11/26/2020   Low back pain 11/22/2020   Hip pain, right 11/22/2020   Light headedness 06/24/2020   Hearing loss 06/24/2020   Binocular vision disorder with diplopia 06/24/2020   Stress 05/17/2020   Chest pain 05/16/2020   Welcome to Medicare preventive visit 06/20/2019   Viral syndrome 01/30/2019   Itching 07/21/2017   Asthma 07/16/2016   Urinary incontinence 07/16/2016   SOB (shortness of breath) 04/01/2016   Bilateral shoulder pain 02/24/2016   Fatigue 01/28/2016   Neck fullness 01/28/2016   Routine  general medical examination at a health care facility 07/25/2015   Health care maintenance 10/09/2014   Neck pain 11/26/2013   Hypercholesterolemia 11/26/2013   History of colonic polyps 05/11/2013   Hyperbilirubinemia 01/18/2013   GERD (gastroesophageal reflux disease) 12/03/2012   Cholelithiasis 11/07/2012   IBS (irritable bowel syndrome) 11/07/2012   History of rheumatic fever 08/28/2012   MVP (mitral valve prolapse) 08/28/2012    PCP: Einar Pheasant, MD  REFERRING PROVIDER: Einar Pheasant, MD  REFERRING DIAGNOSIS: R42 (ICD-10-CM) - Dizziness  THERAPY DIAG: Dizziness and giddiness  Muscle weakness (generalized)  RATIONALE FOR EVALUATION AND TREATMENT: Rehabilitation  ONSET DATE: 09/26/21 (approximate)  FOLLOW UP APPT WITH PROVIDER: Not asked   FROM INITIAL EVALUATION (04/15/22)  SUBJECTIVE:   Chief Complaint:  Dizziness  Pertinent History Pt reports that sometime in March/April she woke up with vertigo. Her symptoms lasted the rest of the day. She eventually scheduled with [facility] where they performed the Epley Maneuver which resolved her vertigo. However, pt reports that she still has persistent "spatial disorientation." At some point after the initial vertigo onset pt started to notice double vision. She went to see her eye doctor and eventually ended up with a referral to neurology. She had a negative MRI Brain without contrast for any  brainstem lesion and negative Myasthenia Gravis labs. She reports that neurology mentioned a referral to neurophthalmology but she never received a referral. She has also seen cardiology to work up her symptoms. Holter monitor revealed an average rate of 68. There were no prolonged pauses. Her heart rate gets in the upper 30s during sleeping hours and as high as 140 during activity. There are no pauses or significant bradycardic episodes during waking hours. Sleep study which showed no significant sleep apnea. She underwent exercise  stress echo where she went just over 4 minutes with no arrhythmia. Echo 11/21 images showed normal resting LV function with what appeared to be augmentation of all segments of the cardiac myocardium with stress. No significant valvular normalities. This did not suggest significant ischemia. She denies any further vertigo but notices that when she rolls over she "feels like she is sinking." She recently finished pelvic floor therapy. She was seen in the past by this therapist for R hip/groin pain. She has also done vestibular therapy in the past for dizziness/imbalance. Patient's husband passed unexpectedly 03/26/20 but pt reports that she is currently doing well without any significant stress.  Brain MRI: 11/11/21 IMPRESSION: No evidence of acute intracranial abnormality.   Mild chronic small vessel ischemic changes within the cerebral white matter, stable from the prior brain MRI of 09/14/2020.  MRA Head: 01/05/22 IMPRESSION: 1. No intracranial large vessel occlusion or proximal high-grade arterial stenosis. 2. 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 A CTA of the head may be helpful for further evaluation.  Description of dizziness: unsteadiness and visual disorientation ("room looks glassy/fish-bowly") Frequency: "mostly constant." Pt states that it does change with her environment Duration: "mostly constant" Symptom nature: constant Progression of symptoms since onset: no change History of similar episodes: Yes, pt reports that years ago she was told that she had an issue with her eye tracking but it never created any issues.   Auditory complaints (tinnitus, pain, drainage, hearing loss, aural fullness): Yes, slight tinnitus bilaterally. Pt reports some "stuffiness" in her ears the last couple days Vision changes (diplopia, visual field loss, recent changes, recent eye exam): Yes, pt complaining of double vision Chest  pain/palpitations: No History of head injury/concussion: No Stress/anxiety: No Headaches/migraines: Prior history of migraines but after her dtr was born she hasn't had headaches  Has patient fallen in last 6 months? 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 Positive: Reports getting hot at night  PRECAUTIONS: None  WEIGHT BEARING RESTRICTIONS No  LIVING ENVIRONMENT: Lives with: lives alone Lives in: House/apartment Stairs: 1 step to enter with a door handle she can hold   PATIENT GOALS: Improve confidence with walking so she can walk more, improve her ability to play with her grandchildren, improve stability so she can help her older friends;   OBJECTIVE EXAMINATION   NEUROLOGICAL SCREEN: (2+ unless otherwise noted.) N=normal  Ab=abnormal  Level Dermatome R L Myotome R L Reflex R L  C3 Anterior Neck N N Sidebend C2-3 N N Jaw CN V    C4 Top of Shoulder N N Shoulder Shrug  C4 N N Hoffman's UMN    C5 Lateral Upper Arm N N Shoulder ABD C4-5 N N Biceps C5-6    C6 Lateral Arm/ Thumb N N Arm Flex/ Wrist Ext C5-6 N N Brachiorad. C5-6    C7 Middle Finger N N Arm Ext//Wrist Flex C6-7 N N Triceps C7    C8 4th & 5th Finger N N Flex/ Ext Carpi Ulnaris C8 N N Patellar (L3-4)    T1 Medial Arm N N Interossei T1 N N Gastrocnemius    L2 Medial thigh/groin N N Illiopsoas (L2-3) N N     L3 Lower thigh/med.knee N N Quadriceps (L3-4) N N     L4 Medial leg/lat thigh N N Tibialis Ant (L4-5) N N     L5 Lat. leg & dorsal foot N N EHL (L5) N N     S1 post/lat foot/thigh/leg N N Gastrocnemius (S1-2) N N      S2 Post./med. thigh & leg N N Hamstrings (L4-S3) N N       CRANIAL NERVES II, III, IV, VI: Pupils equal and reactive to light, visual acuity and visual fields are intact, extraocular muscles are intact  V: Facial sensation is intact and symmetric bilaterally  VII: Facial strength is intact and symmetric bilaterally  VIII: Hearing is normal as tested by gross conversation IX, X: Palate elevates midline, normal phonation, uvula midline XI: Shoulder shrug strength is intact  XII: Tongue protrudes midline   SOMATOSENSORY Grossly intact to light touch bilateral UEs/LEs as determined by testing dermatomes C2-T2 and L2-S2. Proprioception and hot/cold testing deferred on this date.  COORDINATION Finger to Nose: Normal Heel to Shin: Normal Pronator Drift: Negative Rapid Alternating Movements: Normal Finger to Thumb Opposition: Normal   RANGE OF MOTION Cervical Spine AROM grossly WFL and painless in all directions. No focal deficits in AROM noted in [place] strength WNL without focal deficits with the exception of 4/5 bilateral ankle dorsiflexion and 4/5 R shoulder flexion  TRANSFERS/GAIT Independent for transfers and ambulation without assistive device   PATIENT SURVEYS FOTO: To be completed (pt arrived late so unable to complete)  ABC: To be completed  Clinical Test of Sensory Interaction for Balance (CTSIB): Deferred   OCULOMOTOR / VESTIBULAR TESTING  Oculomotor Exam- Room Light  Findings Comments  Ocular Alignment normal   Ocular ROM normal   Spontaneous Nystagmus normal   Gaze-Holding Nystagmus normal   End-Gaze Nystagmus normal   Vergence (normal 2-3") abnormal Pt reporting blurriness >24" from her nose  Smooth Pursuit abnormal Saccadic intrusions noted  Cross-Cover Test normal   Saccades normal   VOR Cancellation normal   Left Head Impulse normal   Right Head Impulse abnormal Corrective saccade required  Static Acuity not examined    Dynamic Acuity not examined     Oculomotor Exam- Fixation Suppressed: Deferred  BPPV TESTS:  Symptoms Duration Intensity Nystagmus  L Dix-Hallpike Dizziness and visual disorientation   Downbeating  R Dix-Hallpike Visual disorientation "things are out of place"   None  L Head Roll Mild nausea   None  R Head Roll Nausea, spinning   After approximately 20 seconds pt starts with vigorous upbeating R torsional nystagmus x 5-7 seconds;  L Sidelying Test      R Sidelying Test      (blank = not tested)  FUNCTIONAL OUTCOME MEASURES   Results Comments  BERG    DGI    FGA    TUG    5TSTS  6 Minute Walk Test    10 Meter Gait Speed    ABC Scale    DHI    (blank = not tested)   TODAY'S TREATMENT    SUBJECTIVE: Pt reports improvement in her symptoms since starting with therapy. She has been using antibiotic ear drops because she was having some bleeding from her ear canal. She reports that she is performing the HEP without significant issues reported.    Pain: No pain reported   Neuromuscular Re-education  Interval history obtained; Seated horizontal smooth pursuit x 60s; Seated vertical smooth pursuit x 60s;  Seated diagonal smooth pursuit 2 x 60s (high right/low left and high left/low right); Seated horizontal saccades x 60s; Seated vertical saccades x 60s;  Seated diagonal saccades 2 x 60s (high right/low left and high left/low right); Seated pencil push-up 3 x 60s; Seated VOR x 1 horizontal 3 x 60s, pt reports dizziness after each repetition; Standing brock string with 3 targets at 18", 4', and 8' with 3s hold on each target 3 x 60s; HEP updated and education provided to patient;   PATIENT EDUCATION:  Education details: Eye exercises, updated HEP Person educated: Patient Education method: Explanation and handout Education comprehension: verbalized understanding and returned demonstration   HOME EXERCISE PROGRAM:  Access Code: [email or web address] URL:  [email or web address] Date: 05/21/2022 Prepared by: Roxana Hires  Exercises - Seated Horizontal Smooth Pursuit  - 2-3 x daily - 7 x weekly - 1 reps - 60s hold - Seated Vertical Smooth Pursuit  - 2-3 x daily - 7 x weekly - 1 reps - 60s hold - Seated Diagonal Smooth Pursuit  - 2-3 x daily - 7 x weekly - 1 reps - 60s hold - Seated Horizontal Saccades  - 2-3 x daily - 7 x weekly - 1 reps - 60s hold - Seated Vertical Saccades  - 2-3 x daily - 7 x weekly - 1 reps - 60s hold - Seated Diagonal Saccades  - 2-3 x daily - 7 x weekly - 1 reps - 60s hold - Pencil Pushups  - 2-3 x daily - 7 x weekly - 1 reps - 60s hold - Brock String  - 2-3 x daily - 7 x weekly - 1 reps - 60s hold - Seated Gaze Stabilization with Head Rotation  - 2-3 x daily - 7 x weekly - 3 reps - 60s hold    ASSESSMENT:  CLINICAL IMPRESSION: Repeated eye exercises including smooth pursuit, saccades, and convergence/divergence. Feedback provided to patient to ensure proper techniques with exercises. Introduced gaze stabilization exercise with patient today which does reproduce her dizziness. HEP updated to include VOR x 1 horizontal in sitting. Pt encouraged to stop or modify if neck pain increases. Pt encouraged to follow-up as scheduled. She will benefit from skilled PT to address above impairments and improve overall function.  REHAB POTENTIAL: Excellent  CLINICAL DECISION MAKING: Evolving/moderate complexity  EVALUATION COMPLEXITY: Moderate   GOALS: Goals reviewed with patient? No  SHORT TERM GOALS: Target date: 05/13/2022  Pt will be independent with HEP for dizziness in order to decrease symptoms, improve balance,decrease fall risk, and improve function at home. Baseline: Goal status: INITIAL   LONG TERM GOALS: Target date: 06/10/2022  Pt will increase FOTO to at least 59 in order to demonstrate significant improvement in function at home related to dizziness.  Baseline: 04/15/22: To be completed;  04/23/22: 39 Goal status: INITIAL  2.  Pt will improve ABC by at least 13% in order to demonstrate clinically  significant improvement in balance confidence.      Baseline: 04/15/22: To be completed Goal status: INITIAL  3. Pt will report at least 90% improvement in her dizziness symptoms in order to improve her ability to safely walk, play with her grandchildren, and help her older friends at church.      Baseline:  Goal status: INITIAL   PLAN:  PT FREQUENCY: 1-2x/week  PT DURATION: 8 weeks  PLANNED INTERVENTIONS: Therapeutic exercises, Therapeutic activity, Neuromuscular re-education, Balance training, Gait training, Patient/Family education, Joint manipulation, Joint mobilization, Canalith repositioning, Aquatic Therapy, Dry Needling, Cognitive remediation, Electrical stimulation, Spinal manipulation, Spinal mobilization, Cryotherapy, Moist heat, Traction, Ultrasound, Ionotophoresis '4mg'$ /ml Dexamethasone, and Manual therapy  PLAN FOR NEXT SESSION: eye exercises (smooth pursuit, saccades, and convergence/divergence), gaze stabilization exercises, consider additional LE strengthening and balance exercises  Lyndel Safe Brianne Maina PT, DPT, GCS  Junelle Hashemi 05/21/2022, 4:06 PM

## 2022-05-23 ENCOUNTER — Ambulatory Visit: Payer: PPO

## 2022-05-23 DIAGNOSIS — R42 Dizziness and giddiness: Secondary | ICD-10-CM

## 2022-05-23 DIAGNOSIS — M6281 Muscle weakness (generalized): Secondary | ICD-10-CM

## 2022-05-23 NOTE — Therapy (Signed)
OUTPATIENT PHYSICAL THERAPY VESTIBULAR TREATMENT  Patient Name: Lynn Mann MRN: [id number] DOB:[birth year], 71 y.o., female Today's Date: 05/24/2022  PCP: Einar Pheasant, MD REFERRING PROVIDER: Einar Pheasant, MD   PT End of Session - 05/23/22 1459     Visit Number 8    Number of Visits 17    Date for PT Re-Evaluation 06/10/22    Authorization Type eval: 04/15/22    PT Start Time 1450    PT Stop Time 1530    PT Time Calculation (min) 40 min    Activity Tolerance Patient tolerated treatment well    Behavior During Therapy [facility] for tasks assessed/performed            Past Medical History:  Diagnosis Date   Arthritis    C. difficile diarrhea    age 21s-40s    Chicken pox    Cholecystitis 11/2011   Did not require sgy - Dr. Staci Acosta - [facility]  (cholelithiasis)   H/O Clostridium difficile infection    IBS (irritable bowel syndrome)    MRSA exposure 2005   Spider bite   MVP (mitral valve prolapse)    Stable - Dr. Ubaldo Glassing   Rheumatic fever    Past Surgical History:  Procedure Laterality Date   CATARACT EXTRACTION  [id number]   MUSCLE BIOPSY     Patient Active Problem List   Diagnosis Date Noted   Nocturia 03/12/2022   Snoring 03/12/2022   Muscle weakness 03/11/2022   Joint ache 02/11/2022   Ankle pain 02/05/2022   Fever 12/26/2021   Abdominal pain 12/26/2021   Cough 09/07/2021   Right shoulder pain 07/15/2021   Hematoma 02/11/2021   Ankle swelling 11/26/2020   Low back pain 11/22/2020   Hip pain, right 11/22/2020   Light headedness 06/24/2020   Hearing loss 06/24/2020   Binocular vision disorder with diplopia 06/24/2020   Stress 05/17/2020   Chest pain 05/16/2020   Welcome to Medicare preventive visit 06/20/2019   Viral syndrome 01/30/2019   Itching 07/21/2017   Asthma 07/16/2016   Urinary incontinence 07/16/2016   SOB (shortness of breath) 04/01/2016   Bilateral shoulder pain 02/24/2016   Fatigue 01/28/2016   Neck fullness 01/28/2016   Routine  general medical examination at a health care facility 07/25/2015   Health care maintenance 10/09/2014   Neck pain 11/26/2013   Hypercholesterolemia 11/26/2013   History of colonic polyps 05/11/2013   Hyperbilirubinemia 01/18/2013   GERD (gastroesophageal reflux disease) 12/03/2012   Cholelithiasis 11/07/2012   IBS (irritable bowel syndrome) 11/07/2012   History of rheumatic fever 08/28/2012   MVP (mitral valve prolapse) 08/28/2012    PCP: Einar Pheasant, MD  REFERRING PROVIDER: Einar Pheasant, MD  REFERRING DIAGNOSIS: R42 (ICD-10-CM) - Dizziness  THERAPY DIAG: Dizziness and giddiness  Muscle weakness (generalized)  RATIONALE FOR EVALUATION AND TREATMENT: Rehabilitation  ONSET DATE: 09/26/21 (approximate)  FOLLOW UP APPT WITH PROVIDER: Not asked   FROM INITIAL EVALUATION (04/15/22)  SUBJECTIVE:   Chief Complaint:  Dizziness  Pertinent History Pt reports that sometime in March/April she woke up with vertigo. Her symptoms lasted the rest of the day. She eventually scheduled with [facility] where they performed the Epley Maneuver which resolved her vertigo. However, pt reports that she still has persistent "spatial disorientation." At some point after the initial vertigo onset pt started to notice double vision. She went to see her eye doctor and eventually ended up with a referral to neurology. She had a negative MRI Brain without contrast for any brainstem  lesion and negative Myasthenia Gravis labs. She reports that neurology mentioned a referral to neurophthalmology but she never received a referral. She has also seen cardiology to work up her symptoms. Holter monitor revealed an average rate of 68. There were no prolonged pauses. Her heart rate gets in the upper 30s during sleeping hours and as high as 140 during activity. There are no pauses or significant bradycardic episodes during waking hours. Sleep study which showed no significant sleep apnea. She underwent exercise  stress echo where she went just over 4 minutes with no arrhythmia. Echo 11/21 images showed normal resting LV function with what appeared to be augmentation of all segments of the cardiac myocardium with stress. No significant valvular normalities. This did not suggest significant ischemia. She denies any further vertigo but notices that when she rolls over she "feels like she is sinking." She recently finished pelvic floor therapy. She was seen in the past by this therapist for R hip/groin pain. She has also done vestibular therapy in the past for dizziness/imbalance. Patient's husband passed unexpectedly 03/26/20 but pt reports that she is currently doing well without any significant stress.  Brain MRI: 11/11/21 IMPRESSION: No evidence of acute intracranial abnormality.   Mild chronic small vessel ischemic changes within the cerebral white matter, stable from the prior brain MRI of 09/14/2020.  MRA Head: 01/05/22 IMPRESSION: 1. No intracranial large vessel occlusion or proximal high-grade arterial stenosis. 2. 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 A CTA of the head may be helpful for further evaluation.  Description of dizziness: unsteadiness and visual disorientation ("room looks glassy/fish-bowly") Frequency: "mostly constant." Pt states that it does change with her environment Duration: "mostly constant" Symptom nature: constant Progression of symptoms since onset: no change History of similar episodes: Yes, pt reports that years ago she was told that she had an issue with her eye tracking but it never created any issues.   Auditory complaints (tinnitus, pain, drainage, hearing loss, aural fullness): Yes, slight tinnitus bilaterally. Pt reports some "stuffiness" in her ears the last couple days Vision changes (diplopia, visual field loss, recent changes, recent eye exam): Yes, pt complaining of double vision Chest  pain/palpitations: No History of head injury/concussion: No Stress/anxiety: No Headaches/migraines: Prior history of migraines but after her dtr was born she hasn't had headaches  Has patient fallen in last 6 months? 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 Positive: Reports getting hot at night  PRECAUTIONS: None  WEIGHT BEARING RESTRICTIONS No  LIVING ENVIRONMENT: Lives with: lives alone Lives in: House/apartment Stairs: 1 step to enter with a door handle she can hold   PATIENT GOALS: Improve confidence with walking so she can walk more, improve her ability to play with her grandchildren, improve stability so she can help her older friends;   OBJECTIVE EXAMINATION   NEUROLOGICAL SCREEN: (2+ unless otherwise noted.) N=normal  Ab=abnormal  Level Dermatome R L Myotome R L Reflex R L  C3 Anterior Neck N N Sidebend C2-3 N N Jaw CN V    C4 Top of Shoulder N N Shoulder Shrug C4  N N Hoffman's UMN    C5 Lateral Upper Arm N N Shoulder ABD C4-5 N N Biceps C5-6    C6 Lateral Arm/ Thumb N N Arm Flex/ Wrist Ext C5-6 N N Brachiorad. C5-6    C7 Middle Finger N N Arm Ext//Wrist Flex C6-7 N N Triceps C7    C8 4th & 5th Finger N N Flex/ Ext Carpi Ulnaris C8 N N Patellar (L3-4)    T1 Medial Arm N N Interossei T1 N N Gastrocnemius    L2 Medial thigh/groin N N Illiopsoas (L2-3) N N     L3 Lower thigh/med.knee N N Quadriceps (L3-4) N N     L4 Medial leg/lat thigh N N Tibialis Ant (L4-5) N N     L5 Lat. leg & dorsal foot N N EHL (L5) N N     S1 post/lat foot/thigh/leg N N Gastrocnemius (S1-2) N N      S2 Post./med. thigh & leg N N Hamstrings (L4-S3) N N       CRANIAL NERVES II, III, IV, VI: Pupils equal and reactive to light, visual acuity and visual fields are intact, extraocular muscles are intact  V: Facial sensation is intact and symmetric bilaterally  VII: Facial strength is intact and symmetric bilaterally  VIII: Hearing is normal as tested by gross conversation IX, X: Palate elevates midline, normal phonation, uvula midline XI: Shoulder shrug strength is intact  XII: Tongue protrudes midline   SOMATOSENSORY Grossly intact to light touch bilateral UEs/LEs as determined by testing dermatomes C2-T2 and L2-S2. Proprioception and hot/cold testing deferred on this date.  COORDINATION Finger to Nose: Normal Heel to Shin: Normal Pronator Drift: Negative Rapid Alternating Movements: Normal Finger to Thumb Opposition: Normal   RANGE OF MOTION Cervical Spine AROM grossly WFL and painless in all directions. No focal deficits in AROM noted in Atkins strength WNL without focal deficits with the exception of 4/5 bilateral ankle dorsiflexion and 4/5 R shoulder flexion  TRANSFERS/GAIT Independent for transfers and ambulation without assistive device   PATIENT SURVEYS FOTO: To be completed (pt arrived late so unable to complete)  ABC: To be completed  Clinical Test of Sensory Interaction for Balance (CTSIB): Deferred   OCULOMOTOR / VESTIBULAR TESTING  Oculomotor Exam- Room Light  Findings Comments  Ocular Alignment normal   Ocular ROM normal   Spontaneous Nystagmus normal   Gaze-Holding Nystagmus normal   End-Gaze Nystagmus normal   Vergence (normal 2-3") abnormal Pt reporting blurriness >24" from her nose  Smooth Pursuit abnormal Saccadic intrusions noted  Cross-Cover Test normal   Saccades normal   VOR Cancellation normal   Left Head Impulse normal   Right Head Impulse abnormal Corrective saccade required  Static Acuity not examined    Dynamic Acuity not examined     Oculomotor Exam- Fixation Suppressed: Deferred  BPPV TESTS:  Symptoms Duration Intensity Nystagmus  L Dix-Hallpike Dizziness and visual disorientation   Downbeating  R Dix-Hallpike Visual disorientation "things are out of place"   None  L Head Roll Mild nausea   None  R Head Roll Nausea, spinning   After approximately 20 seconds pt starts with vigorous upbeating R torsional nystagmus x 5-7 seconds;  L Sidelying Test      R Sidelying Test      (blank = not tested)  FUNCTIONAL OUTCOME MEASURES   Results Comments  BERG    DGI    FGA    TUG    5TSTS  6 Minute Walk Test    10 Meter Gait Speed    ABC Scale    DHI    (blank = not tested)   TODAY'S TREATMENT    SUBJECTIVE: Pt states that she would like to defer the vestibular exercises at this time. She reports that she is performing the HEP without significant issues other than dizziness with the gaze stabilization exercise. No pain reported upon arrival today.   Pain: No pain reported   Ther-ex  NuStep L1 x 6 minutes for warm-up during interval history (2 minutes unbilled); Sit to stand without UE support from regular height chair with Airex pad on seat x 10;  Standing exercises with 2# ankle weights (AW): Hip flexion marches x 10 BLE; Hip abduction x 10 BLE; Hamstring curls x 10  Discussed standing heel raises but previously aggravating to feet so deferred;   Neuromuscular Re-education  Feet together horizontal head/shoulder turns x 30s; Feet together vertical head turns x 30s; Semitandem balance alternating forward LE x 30s with each leg forward; Feet together eyes closed x 30s; Tandem balance alternating forward LE x 30s with each leg forward; Seated horizontal smooth pursuit x 60s; Seated vertical smooth pursuit x 60s;  Seated diagonal smooth pursuit 2 x 60s (high right/low left and high left/low right); Standing brock string with 3 targets at 18", 4', and 8' with 3s hold  on each target 3 x 60s;   Not performed: Seated VOR x 1 horizontal 3 x 60s, pt reports dizziness after each repetition; Seated horizontal saccades x 60s; Seated vertical saccades x 60s;  Seated diagonal saccades 2 x 60s (high right/low left and high left/low right); Seated pencil push-up 3 x 60s; HEP updated and education provided to patient;    PATIENT EDUCATION:  Education details: Eye exercises, balance exercises, strengthening exercises Person educated: Patient Education method: Explanation and handout Education comprehension: verbalized understanding and returned demonstration   HOME EXERCISE PROGRAM:  Access Code: [email or web address] URL: [email or web address] Date: 05/21/2022 Prepared by: Roxana Hires  Exercises - Seated Horizontal Smooth Pursuit  - 2-3 x daily - 7 x weekly - 1 reps - 60s hold - Seated Vertical Smooth Pursuit  - 2-3 x daily - 7 x weekly - 1 reps - 60s hold - Seated Diagonal Smooth Pursuit  - 2-3 x daily - 7 x weekly - 1 reps - 60s hold - Seated Horizontal Saccades  - 2-3 x daily - 7 x weekly - 1 reps - 60s hold - Seated Vertical Saccades  - 2-3 x daily - 7 x weekly - 1 reps - 60s hold - Seated Diagonal Saccades  - 2-3 x daily - 7 x weekly - 1 reps - 60s hold - Pencil Pushups  - 2-3 x daily - 7 x weekly - 1 reps - 60s hold - Brock String  - 2-3 x daily - 7 x weekly - 1 reps - 60s hold - Seated Gaze Stabilization with Head Rotation  - 2-3 x daily - 7 x weekly - 3 reps - 60s hold    ASSESSMENT:  CLINICAL IMPRESSION: Returned to LE strengthening and also general balance exercises. Repeated smooth pursuit and convergence/divergence eye exercises. Deferred gaze stabilization exercise with patient today at her request. No HEP updates at this time but will consider update at next session. Pt encouraged to follow-up as scheduled. She will benefit from skilled PT to address above impairments and improve overall function.  REHAB POTENTIAL:  Excellent  CLINICAL DECISION MAKING: Evolving/moderate complexity  EVALUATION COMPLEXITY: Moderate   GOALS: Goals reviewed with patient? No  SHORT TERM GOALS: Target date: 05/13/2022  Pt will be independent with HEP for dizziness in order to decrease symptoms, improve balance,decrease fall risk, and improve function at home. Baseline: Goal status: INITIAL   LONG TERM GOALS: Target date: 06/10/2022  Pt will increase FOTO to at least 59 in order to demonstrate significant improvement in function at home related to dizziness.  Baseline: 04/15/22: To be completed; 04/23/22: 39 Goal status: INITIAL  2.  Pt will improve ABC by at least 13% in order to demonstrate clinically significant improvement in balance confidence.      Baseline: 04/15/22: To be completed Goal status: INITIAL  3. Pt will report at least 90% improvement in her dizziness symptoms in order to improve her ability to safely walk, play with her grandchildren, and help her older friends at church.      Baseline:  Goal status: INITIAL   PLAN:  PT FREQUENCY: 1-2x/week  PT DURATION: 8 weeks  PLANNED INTERVENTIONS: Therapeutic exercises, Therapeutic activity, Neuromuscular re-education, Balance training, Gait training, Patient/Family education, Joint manipulation, Joint mobilization, Canalith repositioning, Aquatic Therapy, Dry Needling, Cognitive remediation, Electrical stimulation, Spinal manipulation, Spinal mobilization, Cryotherapy, Moist heat, Traction, Ultrasound, Ionotophoresis '4mg'$ /ml Dexamethasone, and Manual therapy  PLAN FOR NEXT SESSION: eye exercises (smooth pursuit, saccades, and convergence/divergence), consider additional LE strengthening and balance exercises, modify/update HEP as appropriate  Lyndel Safe Adrienne Trombetta PT, DPT, GCS  Maybelline Kolarik 05/24/2022, 12:55 PM

## 2022-05-26 NOTE — Therapy (Incomplete)
OUTPATIENT PHYSICAL THERAPY VESTIBULAR TREATMENT  Patient Name: Lynn Mann MRN: [id number] DOB:06-07-1951, 71 y.o., female Today's Date: 05/26/2022  PCP: Einar Pheasant, MD REFERRING PROVIDER: Einar Pheasant, MD    Past Medical History:  Diagnosis Date   Arthritis    C. difficile diarrhea    age 57s-40s    Chicken pox    Cholecystitis 11/2011   Did not require sgy - Dr. Staci Acosta - [facility]  (cholelithiasis)   H/O Clostridium difficile infection    IBS (irritable bowel syndrome)    MRSA exposure 2005   Spider bite   MVP (mitral valve prolapse)    Stable - Dr. Ubaldo Glassing   Rheumatic fever    Past Surgical History:  Procedure Laterality Date   CATARACT EXTRACTION  [id number]   MUSCLE BIOPSY     Patient Active Problem List   Diagnosis Date Noted   Nocturia 03/12/2022   Snoring 03/12/2022   Muscle weakness 03/11/2022   Joint ache 02/11/2022   Ankle pain 02/05/2022   Fever 12/26/2021   Abdominal pain 12/26/2021   Cough 09/07/2021   Right shoulder pain 07/15/2021   Hematoma 02/11/2021   Ankle swelling 11/26/2020   Low back pain 11/22/2020   Hip pain, right 11/22/2020   Light headedness 06/24/2020   Hearing loss 06/24/2020   Binocular vision disorder with diplopia 06/24/2020   Stress 05/17/2020   Chest pain 05/16/2020   Welcome to Medicare preventive visit 06/20/2019   Viral syndrome 01/30/2019   Itching 07/21/2017   Asthma 07/16/2016   Urinary incontinence 07/16/2016   SOB (shortness of breath) 04/01/2016   Bilateral shoulder pain 02/24/2016   Fatigue 01/28/2016   Neck fullness 01/28/2016   Routine general medical examination at a health care facility 07/25/2015   Health care maintenance 10/09/2014   Neck pain 11/26/2013   Hypercholesterolemia 11/26/2013   History of colonic polyps 05/11/2013   Hyperbilirubinemia 01/18/2013   GERD (gastroesophageal reflux disease) 12/03/2012   Cholelithiasis 11/07/2012   IBS (irritable bowel syndrome) 11/07/2012   History of  rheumatic fever 08/28/2012   MVP (mitral valve prolapse) 08/28/2012    PCP: Einar Pheasant, MD  REFERRING PROVIDER: Einar Pheasant, MD  REFERRING DIAGNOSIS: R42 (ICD-10-CM) - Dizziness  THERAPY DIAG: Dizziness and giddiness  Muscle weakness (generalized)  RATIONALE FOR EVALUATION AND TREATMENT: Rehabilitation  ONSET DATE: 09/26/21 (approximate)  FOLLOW UP APPT WITH PROVIDER: Not asked   FROM INITIAL EVALUATION (04/15/22)  SUBJECTIVE:   Chief Complaint:  Dizziness  Pertinent History Pt reports that sometime in March/April she woke up with vertigo. Her symptoms lasted the rest of the day. She eventually scheduled with [facility] where they performed the Epley Maneuver which resolved her vertigo. However, pt reports that she still has persistent "spatial disorientation." At some point after the initial vertigo onset pt started to notice double vision. She went to see her eye doctor and eventually ended up with a referral to neurology. She had a negative MRI Brain without contrast for any brainstem lesion and negative Myasthenia Gravis labs. She reports that neurology mentioned a referral to neurophthalmology but she never received a referral. She has also seen cardiology to work up her symptoms. Holter monitor revealed an average rate of 68. There were no prolonged pauses. Her heart rate gets in the upper 30s during sleeping hours and as high as 140 during activity. There are no pauses or significant bradycardic episodes during waking hours. Sleep study which showed no significant sleep apnea. She underwent exercise stress echo where she  went just over 4 minutes with no arrhythmia. Echo 11/21 images showed normal resting LV function with what appeared to be augmentation of all segments of the cardiac myocardium with stress. No significant valvular normalities. This did not suggest significant ischemia. She denies any further vertigo but notices that when she rolls over she "feels like she  is sinking." She recently finished pelvic floor therapy. She was seen in the past by this therapist for R hip/groin pain. She has also done vestibular therapy in the past for dizziness/imbalance. Patient's husband passed unexpectedly 03/26/20 but pt reports that she is currently doing well without any significant stress.  Brain MRI: 11/11/21 IMPRESSION: No evidence of acute intracranial abnormality.   Mild chronic small vessel ischemic changes within the cerebral white matter, stable from the prior brain MRI of 09/14/2020.  MRA Head: 01/05/22 IMPRESSION: 1. No intracranial large vessel occlusion or proximal high-grade arterial stenosis. 2. 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 A CTA of the head may be helpful for further evaluation.  Description of dizziness: unsteadiness and visual disorientation ("room looks glassy/fish-bowly") Frequency: "mostly constant." Pt states that it does change with her environment Duration: "mostly constant" Symptom nature: constant Progression of symptoms since onset: no change History of similar episodes: Yes, pt reports that years ago she was told that she had an issue with her eye tracking but it never created any issues.   Auditory complaints (tinnitus, pain, drainage, hearing loss, aural fullness): Yes, slight tinnitus bilaterally. Pt reports some "stuffiness" in her ears the last couple days Vision changes (diplopia, visual field loss, recent changes, recent eye exam): Yes, pt complaining of double vision Chest pain/palpitations: No History of head injury/concussion: No Stress/anxiety: No Headaches/migraines: Prior history of migraines but after her dtr was born she hasn't had headaches  Has patient fallen in last 6 months? 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 Positive: Reports getting hot at night  PRECAUTIONS: None  WEIGHT BEARING RESTRICTIONS No  LIVING ENVIRONMENT: Lives with: lives alone Lives in: House/apartment Stairs: 1 step to enter with a door handle she can hold   PATIENT GOALS: Improve confidence with walking so she can walk more, improve her ability to play with her grandchildren, improve stability so she can help her older friends;   OBJECTIVE EXAMINATION   NEUROLOGICAL SCREEN: (2+ unless otherwise noted.) N=normal  Ab=abnormal  Level Dermatome R L Myotome R L Reflex R L  C3 Anterior Neck N N Sidebend C2-3 N N Jaw CN V    C4 Top of Shoulder N N Shoulder Shrug C4 N N Hoffman's UMN    C5 Lateral Upper Arm N N Shoulder ABD C4-5 N N Biceps C5-6    C6 Lateral Arm/ Thumb N N Arm Flex/ Wrist Ext C5-6 N N Brachiorad. C5-6    C7 Middle Finger N N Arm Ext//Wrist Flex C6-7 N N Triceps C7    C8 4th & 5th Finger N N Flex/ Ext Carpi Ulnaris C8 N N Patellar (L3-4)    T1 Medial Arm N N Interossei T1 N N Gastrocnemius  L2 Medial thigh/groin N N Illiopsoas (L2-3) N N     L3 Lower thigh/med.knee N N Quadriceps (L3-4) N N     L4 Medial leg/lat thigh N N Tibialis Ant (L4-5) N N     L5 Lat. leg & dorsal foot N N EHL (L5) N N     S1 post/lat foot/thigh/leg N N Gastrocnemius (S1-2) N N     S2 Post./med. thigh & leg N N Hamstrings (L4-S3) N N       CRANIAL NERVES II, III, IV, VI: Pupils equal and reactive to light, visual acuity and visual fields are intact, extraocular muscles are intact  V: Facial sensation is intact and symmetric bilaterally  VII: Facial strength is intact and symmetric bilaterally  VIII: Hearing is normal as tested by gross  conversation IX, X: Palate elevates midline, normal phonation, uvula midline XI: Shoulder shrug strength is intact  XII: Tongue protrudes midline   SOMATOSENSORY Grossly intact to light touch bilateral UEs/LEs as determined by testing dermatomes C2-T2 and L2-S2. Proprioception and hot/cold testing deferred on this date.  COORDINATION Finger to Nose: Normal Heel to Shin: Normal Pronator Drift: Negative Rapid Alternating Movements: Normal Finger to Thumb Opposition: Normal   RANGE OF MOTION Cervical Spine AROM grossly WFL and painless in all directions. No focal deficits in AROM noted in [place] strength WNL without focal deficits with the exception of 4/5 bilateral ankle dorsiflexion and 4/5 R shoulder flexion  TRANSFERS/GAIT Independent for transfers and ambulation without assistive device   PATIENT SURVEYS FOTO: To be completed (pt arrived late so unable to complete)  ABC: To be completed  Clinical Test of Sensory Interaction for Balance (CTSIB): Deferred   OCULOMOTOR / VESTIBULAR TESTING  Oculomotor Exam- Room Light  Findings Comments  Ocular Alignment normal   Ocular ROM normal   Spontaneous Nystagmus normal   Gaze-Holding Nystagmus normal   End-Gaze Nystagmus normal   Vergence (normal 2-3") abnormal Pt reporting blurriness >24" from her nose  Smooth Pursuit abnormal Saccadic intrusions noted  Cross-Cover Test normal   Saccades normal   VOR Cancellation normal   Left Head Impulse normal   Right Head Impulse abnormal Corrective saccade required  Static Acuity not examined   Dynamic Acuity not examined     Oculomotor Exam- Fixation Suppressed: Deferred  BPPV TESTS:  Symptoms Duration Intensity Nystagmus  L Dix-Hallpike Dizziness and visual disorientation   Downbeating  R Dix-Hallpike Visual disorientation "things are out of place"   None  L Head Roll Mild nausea   None  R Head Roll Nausea, spinning   After approximately 20  seconds pt starts with vigorous upbeating R torsional nystagmus x 5-7 seconds;  L Sidelying Test      R Sidelying Test      (blank = not tested)  FUNCTIONAL OUTCOME MEASURES   Results Comments  BERG    DGI    FGA    TUG    5TSTS    6 Minute Walk Test    10 Meter Gait Speed    ABC Scale    DHI    (blank = not tested)   TODAY'S TREATMENT    SUBJECTIVE: Pt states that she would like to defer the vestibular exercises at this time. She reports that she is performing the HEP without significant issues other than dizziness with the gaze stabilization exercise. No pain reported upon arrival today.   Pain: No pain reported   Ther-ex  NuStep L1 x  6 minutes for warm-up during interval history (2 minutes unbilled); Sit to stand without UE support from regular height chair with Airex pad on seat x 10;  Standing exercises with 2# ankle weights (AW): Hip flexion marches x 10 BLE; Hip abduction x 10 BLE; Hamstring curls x 10  Discussed standing heel raises but previously aggravating to feet so deferred;   Neuromuscular Re-education  Feet together horizontal head/shoulder turns x 30s; Feet together vertical head turns x 30s; Semitandem balance alternating forward LE x 30s with each leg forward; Feet together eyes closed x 30s; Tandem balance alternating forward LE x 30s with each leg forward; Seated horizontal smooth pursuit x 60s; Seated vertical smooth pursuit x 60s;  Seated diagonal smooth pursuit 2 x 60s (high right/low left and high left/low right); Standing brock string with 3 targets at 18", 4', and 8' with 3s hold on each target 3 x 60s;   Not performed: Seated VOR x 1 horizontal 3 x 60s, pt reports dizziness after each repetition; Seated horizontal saccades x 60s; Seated vertical saccades x 60s;  Seated diagonal saccades 2 x 60s (high right/low left and high left/low right); Seated pencil push-up 3 x 60s; HEP updated and education provided to patient;    PATIENT  EDUCATION:  Education details: Eye exercises, balance exercises, strengthening exercises Person educated: Patient Education method: Explanation and handout Education comprehension: verbalized understanding and returned demonstration   HOME EXERCISE PROGRAM:  Access Code: [email or web address] URL: [email or web address] Date: 05/21/2022 Prepared by: Roxana Hires  Exercises - Seated Horizontal Smooth Pursuit  - 2-3 x daily - 7 x weekly - 1 reps - 60s hold - Seated Vertical Smooth Pursuit  - 2-3 x daily - 7 x weekly - 1 reps - 60s hold - Seated Diagonal Smooth Pursuit  - 2-3 x daily - 7 x weekly - 1 reps - 60s hold - Seated Horizontal Saccades  - 2-3 x daily - 7 x weekly - 1 reps - 60s hold - Seated Vertical Saccades  - 2-3 x daily - 7 x weekly - 1 reps - 60s hold - Seated Diagonal Saccades  - 2-3 x daily - 7 x weekly - 1 reps - 60s hold - Pencil Pushups  - 2-3 x daily - 7 x weekly - 1 reps - 60s hold - Brock String  - 2-3 x daily - 7 x weekly - 1 reps - 60s hold - Seated Gaze Stabilization with Head Rotation  - 2-3 x daily - 7 x weekly - 3 reps - 60s hold    ASSESSMENT:  CLINICAL IMPRESSION: Returned to LE strengthening and also general balance exercises. Repeated smooth pursuit and convergence/divergence eye exercises. Deferred gaze stabilization exercise with patient today at her request. No HEP updates at this time but will consider update at next session. Pt encouraged to follow-up as scheduled. She will benefit from skilled PT to address above impairments and improve overall function.  REHAB POTENTIAL: Excellent  CLINICAL DECISION MAKING: Evolving/moderate complexity  EVALUATION COMPLEXITY: Moderate   GOALS: Goals reviewed with patient? No  SHORT TERM GOALS: Target date: 05/13/2022  Pt will be independent with HEP for dizziness in order to decrease symptoms, improve balance,decrease fall risk, and improve function at home. Baseline: Goal status: INITIAL   LONG  TERM GOALS: Target date: 06/10/2022  Pt will increase FOTO to at least 59 in order to demonstrate significant improvement in function at home related to dizziness.  Baseline: 04/15/22: To be completed; 04/23/22: 39 Goal status: INITIAL  2.  Pt will improve ABC by at least 13% in order to demonstrate clinically significant improvement in balance confidence.      Baseline: 04/15/22: To be completed Goal status: INITIAL  3. Pt will report at least 90% improvement in her dizziness symptoms in order to improve her ability to safely walk, play with her grandchildren, and help her older friends at church.      Baseline:  Goal status: INITIAL   PLAN:  PT FREQUENCY: 1-2x/week  PT DURATION: 8 weeks  PLANNED INTERVENTIONS: Therapeutic exercises, Therapeutic activity, Neuromuscular re-education, Balance training, Gait training, Patient/Family education, Joint manipulation, Joint mobilization, Canalith repositioning, Aquatic Therapy, Dry Needling, Cognitive remediation, Electrical stimulation, Spinal manipulation, Spinal mobilization, Cryotherapy, Moist heat, Traction, Ultrasound, Ionotophoresis '4mg'$ /ml Dexamethasone, and Manual therapy  PLAN FOR NEXT SESSION: eye exercises (smooth pursuit, saccades, and convergence/divergence), consider additional LE strengthening and balance exercises, modify/update HEP as appropriate  Lyndel Safe Montray Kliebert PT, DPT, GCS  Yaniah Thiemann 05/26/2022, 9:37 PM

## 2022-05-28 ENCOUNTER — Ambulatory Visit: Payer: PPO

## 2022-05-28 DIAGNOSIS — M6281 Muscle weakness (generalized): Secondary | ICD-10-CM

## 2022-05-28 DIAGNOSIS — R42 Dizziness and giddiness: Secondary | ICD-10-CM

## 2022-05-30 ENCOUNTER — Ambulatory Visit: Payer: PPO | Attending: Internal Medicine

## 2022-05-30 DIAGNOSIS — M6281 Muscle weakness (generalized): Secondary | ICD-10-CM | POA: Insufficient documentation

## 2022-05-30 DIAGNOSIS — R42 Dizziness and giddiness: Secondary | ICD-10-CM | POA: Insufficient documentation

## 2022-05-30 DIAGNOSIS — R2681 Unsteadiness on feet: Secondary | ICD-10-CM | POA: Diagnosis not present

## 2022-05-30 NOTE — Therapy (Signed)
OUTPATIENT PHYSICAL THERAPY VESTIBULAR TREATMENT/GOAL UPDATE  Patient Name: Lynn Mann MRN: [id number] DOB:23-Jun-1951, 71 y.o., female Today's Date: 05/30/2022  PCP: Einar Pheasant, MD REFERRING PROVIDER: Einar Pheasant, MD   PT End of Session - 05/30/22 1110     Visit Number 9    Number of Visits 17    Date for PT Re-Evaluation 06/10/22    Authorization Type eval: 04/15/22    PT Start Time 1110    PT Stop Time 1145    PT Time Calculation (min) 35 min    Activity Tolerance Patient tolerated treatment well    Behavior During Therapy [facility] for tasks assessed/performed            Past Medical History:  Diagnosis Date   Arthritis    C. difficile diarrhea    age 78s-40s    Chicken pox    Cholecystitis 11/2011   Did not require sgy - Dr. Staci Acosta - [facility]  (cholelithiasis)   H/O Clostridium difficile infection    IBS (irritable bowel syndrome)    MRSA exposure 2005   Spider bite   MVP (mitral valve prolapse)    Stable - Dr. Ubaldo Glassing   Rheumatic fever    Past Surgical History:  Procedure Laterality Date   CATARACT EXTRACTION  [id number]   MUSCLE BIOPSY     Patient Active Problem List   Diagnosis Date Noted   Nocturia 03/12/2022   Snoring 03/12/2022   Muscle weakness 03/11/2022   Joint ache 02/11/2022   Ankle pain 02/05/2022   Fever 12/26/2021   Abdominal pain 12/26/2021   Cough 09/07/2021   Right shoulder pain 07/15/2021   Hematoma 02/11/2021   Ankle swelling 11/26/2020   Low back pain 11/22/2020   Hip pain, right 11/22/2020   Light headedness 06/24/2020   Hearing loss 06/24/2020   Binocular vision disorder with diplopia 06/24/2020   Stress 05/17/2020   Chest pain 05/16/2020   Welcome to Medicare preventive visit 06/20/2019   Viral syndrome 01/30/2019   Itching 07/21/2017   Asthma 07/16/2016   Urinary incontinence 07/16/2016   SOB (shortness of breath) 04/01/2016   Bilateral shoulder pain 02/24/2016   Fatigue 01/28/2016   Neck fullness 01/28/2016    Routine general medical examination at a health care facility 07/25/2015   Health care maintenance 10/09/2014   Neck pain 11/26/2013   Hypercholesterolemia 11/26/2013   History of colonic polyps 05/11/2013   Hyperbilirubinemia 01/18/2013   GERD (gastroesophageal reflux disease) 12/03/2012   Cholelithiasis 11/07/2012   IBS (irritable bowel syndrome) 11/07/2012   History of rheumatic fever 08/28/2012   MVP (mitral valve prolapse) 08/28/2012    PCP: Einar Pheasant, MD  REFERRING PROVIDER: Einar Pheasant, MD  REFERRING DIAGNOSIS: R42 (ICD-10-CM) - Dizziness  THERAPY DIAG: Dizziness and giddiness  Muscle weakness (generalized)  Unsteadiness on feet  RATIONALE FOR EVALUATION AND TREATMENT: Rehabilitation  ONSET DATE: 09/26/21 (approximate)  FOLLOW UP APPT WITH PROVIDER: Not asked  FROM INITIAL EVALUATION (04/15/22)  SUBJECTIVE:   Chief Complaint:  Dizziness  Pertinent History Pt reports that sometime in March/April she woke up with vertigo. Her symptoms lasted the rest of the day. She eventually scheduled with [facility] where they performed the Epley Maneuver which resolved her vertigo. However, pt reports that she still has persistent "spatial disorientation." At some point after the initial vertigo onset pt started to notice double vision. She went to see her eye doctor and eventually ended up with a referral to neurology. She had a negative MRI Brain without  contrast for any brainstem lesion and negative Myasthenia Gravis labs. She reports that neurology mentioned a referral to neurophthalmology but she never received a referral. She has also seen cardiology to work up her symptoms. Holter monitor revealed an average rate of 68. There were no prolonged pauses. Her heart rate gets in the upper 30s during sleeping hours and as high as 140 during activity. There are no pauses or significant bradycardic episodes during waking hours. Sleep study which showed no significant sleep  apnea. She underwent exercise stress echo where she went just over 4 minutes with no arrhythmia. Echo 11/21 images showed normal resting LV function with what appeared to be augmentation of all segments of the cardiac myocardium with stress. No significant valvular normalities. This did not suggest significant ischemia. She denies any further vertigo but notices that when she rolls over she "feels like she is sinking." She recently finished pelvic floor therapy. She was seen in the past by this therapist for R hip/groin pain. She has also done vestibular therapy in the past for dizziness/imbalance. Patient's husband passed unexpectedly 03/26/20 but pt reports that she is currently doing well without any significant stress.  Brain MRI: 11/11/21 IMPRESSION: No evidence of acute intracranial abnormality.   Mild chronic small vessel ischemic changes within the cerebral white matter, stable from the prior brain MRI of 09/14/2020.  MRA Head: 01/05/22 IMPRESSION: 1. No intracranial large vessel occlusion or proximal high-grade arterial stenosis. 2. 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 A CTA of the head may be helpful for further evaluation.  Description of dizziness: unsteadiness and visual disorientation ("room looks glassy/fish-bowly") Frequency: "mostly constant." Pt states that it does change with her environment Duration: "mostly constant" Symptom nature: constant Progression of symptoms since onset: no change History of similar episodes: Yes, pt reports that years ago she was told that she had an issue with her eye tracking but it never created any issues.   Auditory complaints (tinnitus, pain, drainage, hearing loss, aural fullness): Yes, slight tinnitus bilaterally. Pt reports some "stuffiness" in her ears the last couple days Vision changes (diplopia, visual field loss, recent changes, recent eye exam): Yes, pt complaining of  double vision Chest pain/palpitations: No History of head injury/concussion: No Stress/anxiety: No Headaches/migraines: Prior history of migraines but after her dtr was born she hasn't had headaches  Has patient fallen in last 6 months? 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 Positive: Reports getting hot at night  PRECAUTIONS: None  WEIGHT BEARING RESTRICTIONS No  LIVING ENVIRONMENT: Lives with: lives alone Lives in: House/apartment Stairs: 1 step to enter with a door handle she can hold   PATIENT GOALS: Improve confidence with walking so she can walk more, improve her ability to play with her grandchildren, improve stability so she can help her older friends;   OBJECTIVE EXAMINATION   NEUROLOGICAL SCREEN: (2+ unless otherwise noted.) N=normal  Ab=abnormal  Level Dermatome R L Myotome R L Reflex R L  C3 Anterior Neck N N Sidebend C2-3 N N Jaw CN V    C4 Top of Shoulder N  N Shoulder Shrug C4 N N Hoffman's UMN    C5 Lateral Upper Arm N N Shoulder ABD C4-5 N N Biceps C5-6    C6 Lateral Arm/ Thumb N N Arm Flex/ Wrist Ext C5-6 N N Brachiorad. C5-6    C7 Middle Finger N N Arm Ext//Wrist Flex C6-7 N N Triceps C7    C8 4th & 5th Finger N N Flex/ Ext Carpi Ulnaris C8 N N Patellar (L3-4)    T1 Medial Arm N N Interossei T1 N N Gastrocnemius    L2 Medial thigh/groin N N Illiopsoas (L2-3) N N     L3 Lower thigh/med.knee N N Quadriceps (L3-4) N N     L4 Medial leg/lat thigh N N Tibialis Ant (L4-5) N N     L5 Lat. leg & dorsal foot N N EHL (L5) N N     S1 post/lat foot/thigh/leg N N  Gastrocnemius (S1-2) N N     S2 Post./med. thigh & leg N N Hamstrings (L4-S3) N N      CRANIAL NERVES II, III, IV, VI: Pupils equal and reactive to light, visual acuity and visual fields are intact, extraocular muscles are intact  V: Facial sensation is intact and symmetric bilaterally  VII: Facial strength is intact and symmetric bilaterally  VIII: Hearing is normal as tested by gross conversation IX, X: Palate elevates midline, normal phonation, uvula midline XI: Shoulder shrug strength is intact  XII: Tongue protrudes midline   SOMATOSENSORY Grossly intact to light touch bilateral UEs/LEs as determined by testing dermatomes C2-T2 and L2-S2. Proprioception and hot/cold testing deferred on this date.  COORDINATION Finger to Nose: Normal Heel to Shin: Normal Pronator Drift: Negative Rapid Alternating Movements: Normal Finger to Thumb Opposition: Normal   RANGE OF MOTION Cervical Spine AROM grossly WFL and painless in all directions. No focal deficits in AROM noted in [place] strength WNL without focal deficits with the exception of 4/5 bilateral ankle dorsiflexion and 4/5 R shoulder flexion  TRANSFERS/GAIT Independent for transfers and ambulation without assistive device   PATIENT SURVEYS FOTO: To be completed (pt arrived late so unable to complete)  ABC: To be completed  Clinical Test of Sensory Interaction for Balance (CTSIB): Deferred   OCULOMOTOR / VESTIBULAR TESTING  Oculomotor Exam- Room Light  Findings Comments  Ocular Alignment normal   Ocular ROM normal   Spontaneous Nystagmus normal   Gaze-Holding Nystagmus normal   End-Gaze Nystagmus normal   Vergence (normal 2-3") abnormal Pt reporting blurriness >24" from her nose  Smooth Pursuit abnormal Saccadic intrusions noted  Cross-Cover Test normal   Saccades normal   VOR Cancellation normal   Left Head Impulse normal   Right Head Impulse abnormal Corrective saccade required   Static Acuity not examined   Dynamic Acuity not examined     Oculomotor Exam- Fixation Suppressed: Deferred  BPPV TESTS:  Symptoms Duration Intensity Nystagmus  L Dix-Hallpike Dizziness and visual disorientation   Downbeating  R Dix-Hallpike Visual disorientation "things are out of place"   None  L Head Roll Mild nausea   None  R Head Roll Nausea, spinning   After approximately 20 seconds pt starts with vigorous upbeating R torsional nystagmus x 5-7 seconds;  L Sidelying Test      R Sidelying Test      (blank = not tested)  FUNCTIONAL OUTCOME MEASURES   Results Comments  BERG    DGI    FGA    TUG  5TSTS    6 Minute Walk Test    10 Meter Gait Speed    ABC Scale    DHI    (blank = not tested)   TODAY'S TREATMENT    SUBJECTIVE: Pt states that she is doing alright today. She reports approximately 30-40% improvement in her dizziness since starting with therapy. No more episodes of vertigo. She has been very active over the last few days and reports BLE fatigue today. No pain reported upon arrival today.   Pain: No pain reported   Ther-ex  Updated outcome measures with patient: FOTO: 49 ABC: 82.5% % Improvement: 30-40% improved; 5TSTS: Only able to perform 1 rep today  Standing mini squats 2 x 10; Standing hip abduction x 10 BLE; Seated isometric ball squeeze 2 x 10; Seated clams with green tband 2 x 10; HEP updated and education provided to patient;   Not performed: Seated VOR x 1 horizontal 3 x 60s, pt reports dizziness after each repetition; Seated horizontal saccades x 60s; Seated vertical saccades x 60s;  Seated diagonal saccades 2 x 60s (high right/low left and high left/low right); Seated pencil push-up 3 x 60s; Feet together horizontal head/shoulder turns x 30s; Feet together vertical head turns x 30s; Semitandem balance alternating forward LE x 30s with each leg forward; Feet together eyes closed x 30s; Tandem balance alternating forward LE x 30s  with each leg forward; Seated horizontal smooth pursuit x 60s; Seated vertical smooth pursuit x 60s;  Seated diagonal smooth pursuit 2 x 60s (high right/low left and high left/low right); Standing brock string with 3 targets at 18", 4', and 8' with 3s hold on each target 3 x 60s;   PATIENT EDUCATION:  Education details: Eye exercises, balance exercises, strengthening exercises Person educated: Patient Education method: Explanation and handout Education comprehension: verbalized understanding and returned demonstration   HOME EXERCISE PROGRAM Access Code: [email or web address] URL: [email or web address] Date: 05/30/2022 Prepared by: Roxana Hires  Exercises - Seated Horizontal Smooth Pursuit  - 2-3 x daily - 7 x weekly - 1 reps - 60s hold - Seated Vertical Smooth Pursuit  - 2-3 x daily - 7 x weekly - 1 reps - 60s hold - Seated Diagonal Smooth Pursuit  - 2-3 x daily - 7 x weekly - 1 reps - 60s hold - Seated Horizontal Saccades  - 2-3 x daily - 7 x weekly - 1 reps - 60s hold - Seated Vertical Saccades  - 2-3 x daily - 7 x weekly - 1 reps - 60s hold - Seated Diagonal Saccades  - 2-3 x daily - 7 x weekly - 1 reps - 60s hold - Pencil Pushups  - 2-3 x daily - 7 x weekly - 1 reps - 60s hold - Brock String  - 2-3 x daily - 7 x weekly - 1 reps - 60s hold - Seated Gaze Stabilization with Head Rotation  - 2-3 x daily - 7 x weekly - 3 reps - 60s hold - Seated Hip Abduction with Resistance  - 1 x daily - 7 x weekly - 2 sets - 10 reps - 3s hold - Seated Hip Adduction Isometrics with Ball  - 1 x daily - 7 x weekly - 2 sets - 10 reps - 3s hold - Mini Squat with Counter Support  - 1 x daily - 7 x weekly - 2 sets - 10 reps - Standing Hip Abduction with Counter Support  - 1 x daily - 7 x weekly - 2 sets -  10 reps - 3s hold - Standing Hip Abduction with Counter Support (Mirrored)  - 1 x daily - 7 x weekly - 2 sets - 10 reps - 3s hold   ASSESSMENT:  CLINICAL IMPRESSION: Updated outcome measures  with patient during visit today. Her FOTO score improved from 39 at initial evaluation to 49 today. Her ABC was 82.5% indicating normal balance confidence. She is only able to perform 1 sit to stand without UE support however reports LE fatigue due to increase in activity over the last few days. Continued LE strengthening today and HEP updated. Focus for future sessions will be LE strengthening with focus on HEP reinforcement/updates. Pt encouraged to follow-up as scheduled. She will benefit from skilled PT to address above impairments and improve overall function.  REHAB POTENTIAL: Excellent  CLINICAL DECISION MAKING: Evolving/moderate complexity  EVALUATION COMPLEXITY: Moderate   GOALS: Goals reviewed with patient? No  SHORT TERM GOALS: Target date: 05/13/2022  Pt will be independent with HEP for dizziness in order to decrease symptoms, improve balance,decrease fall risk, and improve function at home. Baseline: Goal status: INITIAL   LONG TERM GOALS: Target date: 06/10/2022  Pt will increase FOTO to at least 59 in order to demonstrate significant improvement in function at home related to dizziness.  Baseline: 04/15/22: To be completed; 04/23/22: 39; 05/30/22: 49 Goal status: PARTIALLY MET  2.  Pt will improve ABC by at least 13% in order to demonstrate clinically significant improvement in balance confidence.      Baseline: 04/15/22: To be completed; 05/30/22: 82.5% Goal status: WITHIN NORMAL LIMITS  3. Pt will report at least 90% improvement in her dizziness symptoms in order to improve her ability to safely walk, play with her grandchildren, and help her older friends at church.      Baseline: 05/30/22: 30-40% improvement Goal status: PARTIALLY MET   PLAN:  PT FREQUENCY: 1-2x/week  PT DURATION: 8 weeks  PLANNED INTERVENTIONS: Therapeutic exercises, Therapeutic activity, Neuromuscular re-education, Balance training, Gait training, Patient/Family education, Joint manipulation,  Joint mobilization, Canalith repositioning, Aquatic Therapy, Dry Needling, Cognitive remediation, Electrical stimulation, Spinal manipulation, Spinal mobilization, Cryotherapy, Moist heat, Traction, Ultrasound, Ionotophoresis 48m/ml Dexamethasone, and Manual therapy  PLAN FOR NEXT SESSION: BLE strengthening, modify/update HEP as appropriate  JLyndel SafeHuprich PT, DPT, GCS  Arlita Buffkin 05/30/2022, 5:56 PM

## 2022-06-03 ENCOUNTER — Ambulatory Visit: Payer: PPO | Admitting: Urology

## 2022-06-06 ENCOUNTER — Other Ambulatory Visit: Payer: Self-pay

## 2022-06-06 ENCOUNTER — Ambulatory Visit: Payer: PPO

## 2022-06-06 DIAGNOSIS — R2681 Unsteadiness on feet: Secondary | ICD-10-CM

## 2022-06-06 DIAGNOSIS — E7800 Pure hypercholesterolemia, unspecified: Secondary | ICD-10-CM

## 2022-06-06 DIAGNOSIS — R42 Dizziness and giddiness: Secondary | ICD-10-CM

## 2022-06-06 DIAGNOSIS — M6281 Muscle weakness (generalized): Secondary | ICD-10-CM

## 2022-06-06 NOTE — Addendum Note (Signed)
Addended by: Alisa Graff on: 06/06/2022 09:17 PM   Modules accepted: Orders

## 2022-06-06 NOTE — Telephone Encounter (Signed)
Order placed for f/u labs.  

## 2022-06-06 NOTE — Therapy (Signed)
OUTPATIENT PHYSICAL THERAPY VESTIBULAR TREATMENT  Patient Name: Lynn Mann MRN: [id number] DOB:22-Jul-1951, 71 y.o., female Today's Date: 06/06/2022  PCP: Einar Pheasant, MD REFERRING PROVIDER: Einar Pheasant, MD   PT End of Session - 06/06/22 1313     Visit Number 10    Number of Visits 17    Date for PT Re-Evaluation 06/10/22    Authorization Type eval: 04/15/22    PT Start Time 1315    PT Stop Time 1400    PT Time Calculation (min) 45 min    Activity Tolerance Patient tolerated treatment well    Behavior During Therapy [facility] for tasks assessed/performed            Past Medical History:  Diagnosis Date   Arthritis    C. difficile diarrhea    age 32s-40s    Chicken pox    Cholecystitis 11/2011   Did not require sgy - Dr. Staci Acosta - [facility]  (cholelithiasis)   H/O Clostridium difficile infection    IBS (irritable bowel syndrome)    MRSA exposure 2005   Spider bite   MVP (mitral valve prolapse)    Stable - Dr. Ubaldo Glassing   Rheumatic fever    Past Surgical History:  Procedure Laterality Date   CATARACT EXTRACTION  [id number]   MUSCLE BIOPSY     Patient Active Problem List   Diagnosis Date Noted   Nocturia 03/12/2022   Snoring 03/12/2022   Muscle weakness 03/11/2022   Joint ache 02/11/2022   Ankle pain 02/05/2022   Fever 12/26/2021   Abdominal pain 12/26/2021   Cough 09/07/2021   Right shoulder pain 07/15/2021   Hematoma 02/11/2021   Ankle swelling 11/26/2020   Low back pain 11/22/2020   Hip pain, right 11/22/2020   Light headedness 06/24/2020   Hearing loss 06/24/2020   Binocular vision disorder with diplopia 06/24/2020   Stress 05/17/2020   Chest pain 05/16/2020   Welcome to Medicare preventive visit 06/20/2019   Viral syndrome 01/30/2019   Itching 07/21/2017   Asthma 07/16/2016   Urinary incontinence 07/16/2016   SOB (shortness of breath) 04/01/2016   Bilateral shoulder pain 02/24/2016   Fatigue 01/28/2016   Neck fullness 01/28/2016   Routine  general medical examination at a health care facility 07/25/2015   Health care maintenance 10/09/2014   Neck pain 11/26/2013   Hypercholesterolemia 11/26/2013   History of colonic polyps 05/11/2013   Hyperbilirubinemia 01/18/2013   GERD (gastroesophageal reflux disease) 12/03/2012   Cholelithiasis 11/07/2012   IBS (irritable bowel syndrome) 11/07/2012   History of rheumatic fever 08/28/2012   MVP (mitral valve prolapse) 08/28/2012    PCP: Einar Pheasant, MD  REFERRING PROVIDER: Einar Pheasant, MD  REFERRING DIAGNOSIS: R42 (ICD-10-CM) - Dizziness  THERAPY DIAG: Dizziness and giddiness  Muscle weakness (generalized)  Unsteadiness on feet  RATIONALE FOR EVALUATION AND TREATMENT: Rehabilitation  ONSET DATE: 09/26/21 (approximate)  FOLLOW UP APPT WITH PROVIDER: Not asked  FROM INITIAL EVALUATION (04/15/22)  SUBJECTIVE:   Chief Complaint:  Dizziness  Pertinent History Pt reports that sometime in March/April she woke up with vertigo. Her symptoms lasted the rest of the day. She eventually scheduled with [facility] where they performed the Epley Maneuver which resolved her vertigo. However, pt reports that she still has persistent "spatial disorientation." At some point after the initial vertigo onset pt started to notice double vision. She went to see her eye doctor and eventually ended up with a referral to neurology. She had a negative MRI Brain without contrast  for any brainstem lesion and negative Myasthenia Gravis labs. She reports that neurology mentioned a referral to neurophthalmology but she never received a referral. She has also seen cardiology to work up her symptoms. Holter monitor revealed an average rate of 68. There were no prolonged pauses. Her heart rate gets in the upper 30s during sleeping hours and as high as 140 during activity. There are no pauses or significant bradycardic episodes during waking hours. Sleep study which showed no significant sleep apnea. She  underwent exercise stress echo where she went just over 4 minutes with no arrhythmia. Echo 11/21 images showed normal resting LV function with what appeared to be augmentation of all segments of the cardiac myocardium with stress. No significant valvular normalities. This did not suggest significant ischemia. She denies any further vertigo but notices that when she rolls over she "feels like she is sinking." She recently finished pelvic floor therapy. She was seen in the past by this therapist for R hip/groin pain. She has also done vestibular therapy in the past for dizziness/imbalance. Patient's husband passed unexpectedly 03/26/20 but pt reports that she is currently doing well without any significant stress.  Brain MRI: 11/11/21 IMPRESSION: No evidence of acute intracranial abnormality.   Mild chronic small vessel ischemic changes within the cerebral white matter, stable from the prior brain MRI of 09/14/2020.  MRA Head: 01/05/22 IMPRESSION: 1. No intracranial large vessel occlusion or proximal high-grade arterial stenosis. 2. 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 A CTA of the head may be helpful for further evaluation.  Description of dizziness: unsteadiness and visual disorientation ("room looks glassy/fish-bowly") Frequency: "mostly constant." Pt states that it does change with her environment Duration: "mostly constant" Symptom nature: constant Progression of symptoms since onset: no change History of similar episodes: Yes, pt reports that years ago she was told that she had an issue with her eye tracking but it never created any issues.   Auditory complaints (tinnitus, pain, drainage, hearing loss, aural fullness): Yes, slight tinnitus bilaterally. Pt reports some "stuffiness" in her ears the last couple days Vision changes (diplopia, visual field loss, recent changes, recent eye exam): Yes, pt complaining of double  vision Chest pain/palpitations: No History of head injury/concussion: No Stress/anxiety: No Headaches/migraines: Prior history of migraines but after her dtr was born she hasn't had headaches  Has patient fallen in last 6 months? 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 Positive: Reports getting hot at night  PRECAUTIONS: None  WEIGHT BEARING RESTRICTIONS No  LIVING ENVIRONMENT: Lives with: lives alone Lives in: House/apartment Stairs: 1 step to enter with a door handle she can hold   PATIENT GOALS: Improve confidence with walking so she can walk more, improve her ability to play with her grandchildren, improve stability so she can help her older friends;   OBJECTIVE EXAMINATION   NEUROLOGICAL SCREEN: (2+ unless otherwise noted.) N=normal  Ab=abnormal  Level Dermatome R L Myotome R L Reflex R L  C3 Anterior Neck N N Sidebend C2-3 N N Jaw CN V    C4 Top of Shoulder N N  Shoulder Shrug C4 N N Hoffman's UMN    C5 Lateral Upper Arm N N Shoulder ABD C4-5 N N Biceps C5-6    C6 Lateral Arm/ Thumb N N Arm Flex/ Wrist Ext C5-6 N N Brachiorad. C5-6    C7 Middle Finger N N Arm Ext//Wrist Flex C6-7 N N Triceps C7    C8 4th & 5th Finger N N Flex/ Ext Carpi Ulnaris C8 N N Patellar (L3-4)    T1 Medial Arm N N Interossei T1 N N Gastrocnemius    L2 Medial thigh/groin N N Illiopsoas (L2-3) N N     L3 Lower thigh/med.knee N N Quadriceps (L3-4) N N     L4 Medial leg/lat thigh N N Tibialis Ant (L4-5) N N     L5 Lat. leg & dorsal foot N N EHL (L5) N N     S1 post/lat foot/thigh/leg N N Gastrocnemius  (S1-2) N N     S2 Post./med. thigh & leg N N Hamstrings (L4-S3) N N      CRANIAL NERVES II, III, IV, VI: Pupils equal and reactive to light, visual acuity and visual fields are intact, extraocular muscles are intact  V: Facial sensation is intact and symmetric bilaterally  VII: Facial strength is intact and symmetric bilaterally  VIII: Hearing is normal as tested by gross conversation IX, X: Palate elevates midline, normal phonation, uvula midline XI: Shoulder shrug strength is intact  XII: Tongue protrudes midline   SOMATOSENSORY Grossly intact to light touch bilateral UEs/LEs as determined by testing dermatomes C2-T2 and L2-S2. Proprioception and hot/cold testing deferred on this date.  COORDINATION Finger to Nose: Normal Heel to Shin: Normal Pronator Drift: Negative Rapid Alternating Movements: Normal Finger to Thumb Opposition: Normal   RANGE OF MOTION Cervical Spine AROM grossly WFL and painless in all directions. No focal deficits in AROM noted in [place] strength WNL without focal deficits with the exception of 4/5 bilateral ankle dorsiflexion and 4/5 R shoulder flexion  TRANSFERS/GAIT Independent for transfers and ambulation without assistive device   PATIENT SURVEYS FOTO: To be completed (pt arrived late so unable to complete)  ABC: To be completed  Clinical Test of Sensory Interaction for Balance (CTSIB): Deferred   OCULOMOTOR / VESTIBULAR TESTING  Oculomotor Exam- Room Light  Findings Comments  Ocular Alignment normal   Ocular ROM normal   Spontaneous Nystagmus normal   Gaze-Holding Nystagmus normal   End-Gaze Nystagmus normal   Vergence (normal 2-3") abnormal Pt reporting blurriness >24" from her nose  Smooth Pursuit abnormal Saccadic intrusions noted  Cross-Cover Test normal   Saccades normal   VOR Cancellation normal   Left Head Impulse normal   Right Head Impulse abnormal Corrective saccade required  Static Acuity not  examined   Dynamic Acuity not examined     Oculomotor Exam- Fixation Suppressed: Deferred  BPPV TESTS:  Symptoms Duration Intensity Nystagmus  L Dix-Hallpike Dizziness and visual disorientation   Downbeating  R Dix-Hallpike Visual disorientation "things are out of place"   None  L Head Roll Mild nausea   None  R Head Roll Nausea, spinning   After approximately 20 seconds pt starts with vigorous upbeating R torsional nystagmus x 5-7 seconds;  L Sidelying Test      R Sidelying Test      (blank = not tested)  FUNCTIONAL OUTCOME MEASURES   Results Comments  BERG    DGI    FGA    TUG  5TSTS    6 Minute Walk Test    10 Meter Gait Speed    ABC Scale    DHI    (blank = not tested)   TODAY'S TREATMENT    SUBJECTIVE: Pt states that she is doing alright today. However she had severe soreness in her thighs after the last therapy session. She would like to know if it is possible that it could have been related to Crestor. No more episodes of vertigo. She has been very active walking and climbing stairs around downtown [place] without any worsening of leg soreness. No pain reported upon arrival today.   Pain: No pain reported   Ther-ex  Hooklying marching 2 x 10 BLE; Hooklying clams with red tband 2 x 10 BLE; Hooklying adductor ball squeezes 2 x 10 BLE; Hooklying bridges with arms at side 2 x 10; Hooklying SAQ over bolster 2 x 10 BLE, with manual resistance; Sidelying straight knee hip abduction 2 x 10 BLE; Prone hamstring curls 2 x 10 BLE; Prone hip extension 2 x 5, notable weakness and fatigue; HEP updated and education provided to patient;   Not performed: Seated VOR x 1 horizontal 3 x 60s, pt reports dizziness after each repetition; Seated horizontal saccades x 60s; Seated vertical saccades x 60s;  Seated diagonal saccades 2 x 60s (high right/low left and high left/low right); Seated pencil push-up 3 x 60s; Feet together horizontal head/shoulder turns x 30s; Feet  together vertical head turns x 30s; Semitandem balance alternating forward LE x 30s with each leg forward; Feet together eyes closed x 30s; Tandem balance alternating forward LE x 30s with each leg forward; Seated horizontal smooth pursuit x 60s; Seated vertical smooth pursuit x 60s;  Seated diagonal smooth pursuit 2 x 60s (high right/low left and high left/low right); Standing brock string with 3 targets at 18", 4', and 8' with 3s hold on each target 3 x 60s; Standing mini squats 2 x 10; Standing hip abduction x 10 BLE;   PATIENT EDUCATION:  Education details: Pt educated throughout session about proper posture and technique with exercises. Improved exercise technique, movement at target joints, use of target muscles after min to mod verbal, visual, tactile cues, updated HEP Person educated: Patient Education method: Explanation and handout Education comprehension: verbalized understanding and returned demonstration   HOME EXERCISE PROGRAM Access Code: [email or web address] URL: [email or web address] Date: 06/06/2022 Prepared by: Roxana Hires  Exercises - Seated Horizontal Smooth Pursuit  - 2-3 x daily - 7 x weekly - 1 reps - 60s hold - Seated Vertical Smooth Pursuit  - 2-3 x daily - 7 x weekly - 1 reps - 60s hold - Seated Diagonal Smooth Pursuit  - 2-3 x daily - 7 x weekly - 1 reps - 60s hold - Seated Horizontal Saccades  - 2-3 x daily - 7 x weekly - 1 reps - 60s hold - Seated Vertical Saccades  - 2-3 x daily - 7 x weekly - 1 reps - 60s hold - Seated Diagonal Saccades  - 2-3 x daily - 7 x weekly - 1 reps - 60s hold - Pencil Pushups  - 2-3 x daily - 7 x weekly - 1 reps - 60s hold - Brock String  - 2-3 x daily - 7 x weekly - 1 reps - 60s hold - Seated Gaze Stabilization with Head Rotation  - 2-3 x daily - 7 x weekly - 3 reps - 60s hold - Seated Hip Abduction with Resistance  - 1 x daily -  7 x weekly - 2 sets - 10 reps - 3s hold - Seated Hip Adduction Isometrics with Ball  - 1 x  daily - 7 x weekly - 2 sets - 10 reps - 3s hold - Standing Hip Abduction with Counter Support  - 1 x daily - 7 x weekly - 2 sets - 10 reps - 3s hold - Standing Hip Abduction with Counter Support (Mirrored)  - 1 x daily - 7 x weekly - 2 sets - 10 reps - 3s hold - Supine Bridge  - 1 x daily - 7 x weekly - 2 sets - 10 reps - 3s hold   ASSESSMENT:  CLINICAL IMPRESSION: Regressed LE strengthening today to mat table exercises due to reported severe soreness after the last therapy session. It is unlikely her soreness was related to taking Crestor as this is not a new medication for patient however if she is concerned she can contact her PCP. Updated HEP to minimize exercise which might cause significant delayed onset muscle soreness. Focus for future sessions will continue to be BLE strengthening with focus on HEP reinforcement/updates. Pt encouraged to follow-up as scheduled. She will benefit from skilled PT to address above impairments and improve overall function.  REHAB POTENTIAL: Excellent  CLINICAL DECISION MAKING: Evolving/moderate complexity  EVALUATION COMPLEXITY: Moderate   GOALS: Goals reviewed with patient? No  SHORT TERM GOALS: Target date: 05/13/2022  Pt will be independent with HEP for dizziness in order to decrease symptoms, improve balance,decrease fall risk, and improve function at home. Baseline: Goal status: INITIAL   LONG TERM GOALS: Target date: 06/10/2022  Pt will increase FOTO to at least 59 in order to demonstrate significant improvement in function at home related to dizziness.  Baseline: 04/15/22: To be completed; 04/23/22: 39; 05/30/22: 49 Goal status: PARTIALLY MET  2.  Pt will improve ABC by at least 13% in order to demonstrate clinically significant improvement in balance confidence.      Baseline: 04/15/22: To be completed; 05/30/22: 82.5% Goal status: WITHIN NORMAL LIMITS  3. Pt will report at least 90% improvement in her dizziness symptoms in order to  improve her ability to safely walk, play with her grandchildren, and help her older friends at church.      Baseline: 05/30/22: 30-40% improvement Goal status: PARTIALLY MET   PLAN:  PT FREQUENCY: 1-2x/week  PT DURATION: 8 weeks  PLANNED INTERVENTIONS: Therapeutic exercises, Therapeutic activity, Neuromuscular re-education, Balance training, Gait training, Patient/Family education, Joint manipulation, Joint mobilization, Canalith repositioning, Aquatic Therapy, Dry Needling, Cognitive remediation, Electrical stimulation, Spinal manipulation, Spinal mobilization, Cryotherapy, Moist heat, Traction, Ultrasound, Ionotophoresis 21m/ml Dexamethasone, and Manual therapy  PLAN FOR NEXT SESSION: BLE strengthening, modify/update HEP as appropriate  JLyndel SafeHuprich PT, DPT, GCS  Iasia Forcier 06/06/2022, 5:57 PM

## 2022-06-06 NOTE — Telephone Encounter (Signed)
Patient has a lab appt 06/10/2022, there are no orders in.

## 2022-06-06 NOTE — Telephone Encounter (Signed)
Orders placed.

## 2022-06-10 ENCOUNTER — Other Ambulatory Visit (INDEPENDENT_AMBULATORY_CARE_PROVIDER_SITE_OTHER): Payer: PPO

## 2022-06-10 ENCOUNTER — Encounter: Payer: PPO | Admitting: Internal Medicine

## 2022-06-10 DIAGNOSIS — E7800 Pure hypercholesterolemia, unspecified: Secondary | ICD-10-CM | POA: Diagnosis not present

## 2022-06-10 LAB — BASIC METABOLIC PANEL
BUN: 16 mg/dL (ref 6–23)
CO2: 28 mEq/L (ref 19–32)
Calcium: 9.4 mg/dL (ref 8.4–10.5)
Chloride: 107 mEq/L (ref 96–112)
Creatinine, Ser: 0.7 mg/dL (ref 0.40–1.20)
GFR: 88.04 mL/min (ref >60.00)
Glucose, Bld: 91 mg/dL (ref 70–99)
Potassium: 4.8 mEq/L (ref 3.5–5.1)
Sodium: 142 mEq/L (ref 135–145)

## 2022-06-10 LAB — LIPID PANEL
Cholesterol: 142 mg/dL (ref 0–200)
HDL: 40.3 mg/dL (ref >39.00)
LDL Cholesterol: 72 mg/dL (ref 0–99)
NonHDL: 102.14
Total CHOL/HDL Ratio: 4
Triglycerides: 149 mg/dL (ref 0.0–149.0)
VLDL: 29.8 mg/dL (ref 0.0–40.0)

## 2022-06-10 LAB — CBC WITH DIFFERENTIAL/PLATELET
Basophils Absolute: 0 10*3/uL (ref 0.0–0.1)
Basophils Relative: 0.5 % (ref 0.0–3.0)
Eosinophils Absolute: 0.1 10*3/uL (ref 0.0–0.7)
Eosinophils Relative: 0.9 % (ref 0.0–5.0)
HCT: 40.8 % (ref 36.0–46.0)
Hemoglobin: 13.2 g/dL (ref 12.0–15.0)
Lymphocytes Relative: 33.9 % (ref 12.0–46.0)
Lymphs Abs: 1.9 10*3/uL (ref 0.7–4.0)
MCHC: 32.4 g/dL (ref 30.0–36.0)
MCV: 90.5 fl (ref 78.0–100.0)
Monocytes Absolute: 0.5 10*3/uL (ref 0.1–1.0)
Monocytes Relative: 8.5 % (ref 3.0–12.0)
Neutro Abs: 3.1 10*3/uL (ref 1.4–7.7)
Neutrophils Relative %: 56.2 % (ref 43.0–77.0)
Platelets: 274 10*3/uL (ref 150.0–400.0)
RBC: 4.5 Mil/uL (ref 3.87–5.11)
RDW: 13.8 % (ref 11.5–15.5)
WBC: 5.5 10*3/uL (ref 4.0–10.5)

## 2022-06-10 LAB — HEPATIC FUNCTION PANEL
ALT: 15 U/L (ref 0–35)
AST: 19 U/L (ref 0–37)
Albumin: 4.3 g/dL (ref 3.5–5.2)
Alkaline Phosphatase: 53 U/L (ref 39–117)
Bilirubin, Direct: 0.2 mg/dL (ref 0.0–0.3)
Total Bilirubin: 0.9 mg/dL (ref 0.2–1.2)
Total Protein: 6.4 g/dL (ref 6.0–8.3)

## 2022-06-12 ENCOUNTER — Ambulatory Visit
Admission: RE | Admit: 2022-06-12 | Discharge: 2022-06-12 | Disposition: A | Discharge status: Home / Self Care | Payer: PPO | Source: Ambulatory Visit | Attending: Internal Medicine | Admitting: Internal Medicine

## 2022-06-12 DIAGNOSIS — Z1231 Encounter for screening mammogram for malignant neoplasm of breast: Secondary | ICD-10-CM | POA: Insufficient documentation

## 2022-06-13 ENCOUNTER — Encounter: Payer: Self-pay | Admitting: Internal Medicine

## 2022-06-13 ENCOUNTER — Ambulatory Visit (INDEPENDENT_AMBULATORY_CARE_PROVIDER_SITE_OTHER): Payer: PPO | Admitting: Internal Medicine

## 2022-06-13 VITALS — BP 144/80 | HR 58 | Temp 97.9°F | Resp 18 | Ht 67.0 in | Wt 173.8 lb

## 2022-06-13 DIAGNOSIS — E2839 Other primary ovarian failure: Secondary | ICD-10-CM

## 2022-06-13 DIAGNOSIS — H532 Diplopia: Secondary | ICD-10-CM

## 2022-06-13 DIAGNOSIS — E7800 Pure hypercholesterolemia, unspecified: Secondary | ICD-10-CM

## 2022-06-13 DIAGNOSIS — M6281 Muscle weakness (generalized): Secondary | ICD-10-CM

## 2022-06-13 DIAGNOSIS — Z860100 Personal history of colon polyps, unspecified: Secondary | ICD-10-CM

## 2022-06-13 DIAGNOSIS — J4520 Mild intermittent asthma, uncomplicated: Secondary | ICD-10-CM | POA: Diagnosis not present

## 2022-06-13 DIAGNOSIS — Z0000 Encounter for general adult medical examination without abnormal findings: Secondary | ICD-10-CM | POA: Diagnosis not present

## 2022-06-13 DIAGNOSIS — F439 Reaction to severe stress, unspecified: Secondary | ICD-10-CM | POA: Diagnosis not present

## 2022-06-13 NOTE — Progress Notes (Signed)
Patient ID: Lynn Mann, female   DOB: [birth year], 71 y.o.   MRN: [id number]   Subjective:    Patient ID: Lynn Mann, female    DOB: 04/23/1951, 70 y.o.   MRN: [id number]   Patient here for  Chief Complaint  Patient presents with   Annual Exam    CPE   .   HPI Here for a physical. Going to PT - vestibular rehab. Previously saw ENT - Epley maneuver - resolved vertigo.  Has had persistent "spatial disorientation".  Seeing PT.  Previous double vision.  MRI - unrevealing.  Negative myasthenia labs.  Has seen cardiology.  Holter no prolonged pauses.  Sleep study - no sleep apnea.  She underwent exercise stress echo where she went just over 4 minutes with no arrhythmia. Echo 11/21 images showed normal resting LV function with what appeared to be augmentation of all segments of the cardiac myocardium with stress. No significant valvular normalities. This did not suggest significant ischemia. No chest pain reported.  Breathing stable.  She is concerned regarding weakness.  States she is not able to get up out of a chair - without using her arms.  Present over the last 6-8 weeks. Saw urology 04/24/22 - pelvic floor therapy - per pt - had to be interrupted.  Trial of myrbetriq. Does not feel myrbetriq is helping.    Past Medical History:  Diagnosis Date   Arthritis    C. difficile diarrhea    age 78s-40s    Chicken pox    Cholecystitis 11/2011   Did not require sgy - Dr. Staci Acosta - [facility]  (cholelithiasis)   H/O Clostridium difficile infection    IBS (irritable bowel syndrome)    MRSA exposure 2005   Spider bite   MVP (mitral valve prolapse)    Stable - Dr. Ubaldo Glassing   Rheumatic fever    Past Surgical History:  Procedure Laterality Date   CATARACT EXTRACTION  [id number]   MUSCLE BIOPSY     Family History  Problem Relation Age of Onset   Arthritis Mother    Stroke Mother    Diabetes Mother    Hypertension Mother    Arthritis Father    Stroke Father    Diabetes Paternal Grandmother     Cancer Paternal Uncle        colon   Heart disease Other        maternal and paternal side   Breast cancer Paternal 8    Breast cancer Cousin    Breast cancer Cousin        female cousin   Breast cancer Cousin    Social History   Socioeconomic History   Marital status: Widowed    Spouse name: Not on file   Number of children: Not on file   Years of education: 16   Highest education level: Not on file  Occupational History   Occupation: Retired  Tobacco Use   Smoking status: Never    Passive exposure: Never   Smokeless tobacco: Never  Vaping Use   Vaping Use: Never used  Substance and Sexual Activity   Alcohol use: Not Currently    Comment: Rarely   Drug use: No   Sexual activity: Yes    Partners: Male    Birth control/protection: Post-menopausal  Other Topics Concern   Not on file  Social History Narrative   Widowed    2 kids son and daughter   Social Determinants of Radio broadcast assistant  Strain: Low Risk  (06/27/2021)   Overall Financial Resource Strain (CARDIA)    Difficulty of Paying Living Expenses: Not hard at all  Food Insecurity: No Food Insecurity (06/27/2021)   Hunger Vital Sign    Worried About Running Out of Food in the Last Year: Never true    Ran Out of Food in the Last Year: Never true  Transportation Needs: No Transportation Needs (06/27/2021)   PRAPARE - Hydrologist (Medical): No    Lack of Transportation (Non-Medical): No  Physical Activity: Insufficiently Active (06/27/2021)   Exercise Vital Sign    Days of Exercise per Week: 2 days    Minutes of Exercise per Session: 50 min  Stress: No Stress Concern Present (06/27/2021)   [place]    Feeling of Stress : Not at all  Social Connections: Unknown (06/27/2021)   Social Connection and Isolation Panel [NHANES]    Frequency of Communication with Friends and Family: More than three times a  week    Frequency of Social Gatherings with Friends and Family: More than three times a week    Attends Religious Services: Not on file    Active Member of Clubs or Organizations: Not on file    Attends Archivist Meetings: Not on file    Marital Status: Widowed     Review of Systems  Constitutional:  Negative for appetite change and unexpected weight change.  HENT:  Negative for congestion, sinus pressure and sore throat.   Eyes:  Negative for pain and visual disturbance.  Respiratory:  Negative for cough, chest tightness and shortness of breath.   Cardiovascular:  Negative for chest pain, palpitations and leg swelling.  Gastrointestinal:  Negative for abdominal pain, diarrhea, nausea and vomiting.  Genitourinary:  Negative for difficulty urinating and dysuria.  Musculoskeletal:  Negative for joint swelling and myalgias.  Skin:  Negative for color change and rash.  Neurological:  Negative for dizziness and headaches.       Working with PT - vestibular rehab - unsteadiness.   Hematological:  Negative for adenopathy. Does not bruise/bleed easily.  Psychiatric/Behavioral:  Negative for agitation and dysphoric mood.        Objective:     BP (!) 144/80 (BP Location: Left Arm, Patient Position: Sitting, Cuff Size: Small)   Pulse (!) 58   Temp 97.9 F (36.6 C) (Temporal)   Resp 18   Ht '5\' 7"'$  (1.702 m)   Wt 173 lb 12.8 oz (78.8 kg)   SpO2 96%   BMI 27.22 kg/m  Wt Readings from Last 3 Encounters:  06/17/22 178 lb (80.7 kg)  06/13/22 173 lb 12.8 oz (78.8 kg)  05/08/22 173 lb (78.5 kg)    Physical Exam Vitals reviewed.  Constitutional:      General: She is not in acute distress.    Appearance: Normal appearance. She is well-developed.  HENT:     Head: Normocephalic and atraumatic.     Right Ear: External ear normal.     Left Ear: External ear normal.  Eyes:     General: No scleral icterus.       Right eye: No discharge.        Left eye: No discharge.      Conjunctiva/sclera: Conjunctivae normal.  Neck:     Thyroid: No thyromegaly.  Cardiovascular:     Rate and Rhythm: Normal rate and regular rhythm.  Pulmonary:     Effort:  No tachypnea, accessory muscle usage or respiratory distress.     Breath sounds: Normal breath sounds. No decreased breath sounds or wheezing.  Chest:  Breasts:    Right: No inverted nipple, mass, nipple discharge or tenderness (no axillary adenopathy).     Left: No inverted nipple, mass, nipple discharge or tenderness (no axilarry adenopathy).  Abdominal:     General: Bowel sounds are normal.     Palpations: Abdomen is soft.     Tenderness: There is no abdominal tenderness.  Musculoskeletal:        General: No swelling or tenderness.     Cervical back: Neck supple.  Lymphadenopathy:     Cervical: No cervical adenopathy.  Skin:    Findings: No erythema or rash.  Neurological:     Mental Status: She is alert and oriented to person, place, and time.  Psychiatric:        Mood and Affect: Mood normal.        Behavior: Behavior normal.      Outpatient Encounter Medications as of 06/13/2022  Medication Sig   albuterol (VENTOLIN HFA) 108 (90 Base) MCG/ACT inhaler Inhale 2 puffs into the lungs every 6 (six) hours as needed.   aspirin 325 MG tablet Take by mouth.   Calcium Carbonate-Vitamin D (CALCIUM-VITAMIN D3 PO) Take by mouth.   cetirizine (ZYRTEC) 10 MG tablet Take 1 tablet (10 mg total) by mouth daily as needed for allergies.   Cholecalciferol (VITAMIN D3 PO) Take by mouth.   ciprofloxacin-dexamethasone (CIPRODEX) OTIC suspension Place 4 drops into the left ear 2 (two) times daily. X4-7 days   fluticasone (FLONASE) 50 MCG/ACT nasal spray Place 2 sprays into both nostrils daily. prn   Multiple Vitamin (MULTIVITAMIN) tablet Take 1 tablet by mouth daily.   Multiple Vitamins-Minerals (PRESERVISION AREDS 2 PO) Take by mouth.   neomycin-polymyxin-hydrocortisone (CORTISPORIN) OTIC solution Place 4 drops into the  left ear 3 (three) times daily. X4-7 days, ciprodex too expensive   rosuvastatin (CRESTOR) 5 MG tablet Take 1 tablet (5 mg total) by mouth daily.   [DISCONTINUED] CALCIUM PO Take by mouth.   [DISCONTINUED] mirabegron ER (MYRBETRIQ) 50 MG TB24 tablet Take 1 tablet (50 mg total) by mouth daily.   [DISCONTINUED] b complex vitamins capsule Take 1 capsule by mouth daily. (Patient not taking: Reported on 05/08/2022)   [DISCONTINUED] cyclobenzaprine (FLEXERIL) 5 MG tablet Take 1 tablet (5 mg total) by mouth 3 (three) times daily as needed. (Patient not taking: Reported on 05/08/2022)   [DISCONTINUED] meclizine (ANTIVERT) 25 MG tablet Take 0.5-1 tablets (12.5-25 mg total) by mouth 2 (two) times daily as needed for dizziness. (Patient not taking: Reported on 05/08/2022)   No facility-administered encounter medications on file as of 06/13/2022.     Lab Results  Component Value Date   WBC 5.5 06/10/2022   HGB 13.2 06/10/2022   HCT 40.8 06/10/2022   PLT 274.0 06/10/2022   GLUCOSE 91 06/10/2022   CHOL 142 06/10/2022   TRIG 149.0 06/10/2022   HDL 40.30 06/10/2022   LDLDIRECT 110.0 02/11/2017   LDLCALC 72 06/10/2022   ALT 15 06/10/2022   AST 19 06/10/2022   NA 142 06/10/2022   K 4.8 06/10/2022   CL 107 06/10/2022   CREATININE 0.70 06/10/2022   BUN 16 06/10/2022   CO2 28 06/10/2022   TSH 1.76 03/11/2022   HGBA1C 5.4 07/15/2017       Assessment & Plan:   Problem List Items Addressed This Visit     Asthma  Breathing overall appears to be stable.  Lungs clear.        Binocular vision disorder with diplopia    Is followed by optometry.  Recently evaluated by neurology.  Labs unrevealing (including MG labs).  MRI - no acute abnormality.  Continue f/u with neurology.        Estrogen deficiency    Schedule bone density.        Relevant Orders   DG Bone Density   Health care maintenance    Physical today 06/13/22.   PAP 07/15/16.  Mammogram 06/14/22- birads I.  Colonoscopy 08/2017 -  tubular adenoma.  Recommended f/u in 5 years.  Discussed vaccines.        History of colonic polyps    Colonoscopy 08/2017 - tubular adenoma.  Recommended f/u colonoscopy in 5 years.       Hypercholesterolemia    On crestor.  Low cholesterol diet and exercise.  Follow lipid panel and liver function tests.       Relevant Orders   TSH   Lipid panel   Basic metabolic panel   Hepatic function panel   Muscle weakness    Discussed weakness concerns.  Reports being unable to get out of chair without using her hands.  Discussed neurology evaluation - possible EMT testing, etc.  PT.  Check ck.       Relevant Orders   Ambulatory referral to Neurology   Routine general medical examination at a health care facility - Primary   Stress    Overall appears to be handling things relatively well.  Follow.         Einar Pheasant, MD

## 2022-06-13 NOTE — Assessment & Plan Note (Addendum)
Physical today 06/13/22.   PAP 07/15/16.  Mammogram 06/14/22- birads I.  Colonoscopy 08/2017 - tubular adenoma.  Recommended f/u in 5 years.  Discussed vaccines.

## 2022-06-17 ENCOUNTER — Ambulatory Visit: Payer: PPO | Admitting: Urology

## 2022-06-17 VITALS — BP 120/76 | HR 96 | Ht 67.0 in | Wt 178.0 lb

## 2022-06-17 DIAGNOSIS — N3281 Overactive bladder: Secondary | ICD-10-CM | POA: Diagnosis not present

## 2022-06-17 DIAGNOSIS — N3941 Urge incontinence: Secondary | ICD-10-CM

## 2022-06-17 LAB — BLADDER SCAN AMB NON-IMAGING

## 2022-06-17 MED ORDER — GEMTESA 75 MG PO TABS: 75.0000 mg | ORAL_TABLET | Freq: Every day | ORAL | 0 refills | Status: DC

## 2022-06-17 MED ORDER — GEMTESA 75 MG PO TABS: 75.0000 mg | ORAL_TABLET | Freq: Every day | ORAL | 11 refills | Status: DC

## 2022-06-17 NOTE — Progress Notes (Signed)
06/17/2022 1:48 PM   Lynn Mann 1950-10-04 [id number]  Referring provider: Einar Pheasant, [address] [place],  [place] [address]  No chief complaint on file.   HPI: SN: Mixed incontinence not interested in medication and opted for percutaneous tibial nerve stimulation.  She was given Myrbetriq and pelvic floor therapy  Patient currently primarily has foot on the floor syndrome.  It can be quite high-volume and she wears a number of pads at night.  She has rare urge incontinence during the day.  She can hold urination for 2 to 5 hours but sometimes she will have key in the door syndrome but otherwise no urge incontinence.  She voids 2-4 times a night and has intermittent ankle edema does not take a diuretic  No stress incontinence or bedwetting  Flow was good and she feels empty  Failed Myrbetriq.  Myrbetriq before COVID helped some.  She has not had a hysterectomy  No neurologic issues.  Looser bowel movements.  On pelvic examination well supported bladder neck and negative cough test.  Grade 1 cystocele   PMH: Past Medical History:  Diagnosis Date   Arthritis    C. difficile diarrhea    age 20s-40s    Chicken pox    Cholecystitis 11/2011   Did not require sgy - Dr. Staci Acosta - [facility]  (cholelithiasis)   H/O Clostridium difficile infection    IBS (irritable bowel syndrome)    MRSA exposure 2005   Spider bite   MVP (mitral valve prolapse)    Stable - Dr. Ubaldo Glassing   Rheumatic fever     Surgical History: Past Surgical History:  Procedure Laterality Date   CATARACT EXTRACTION  [id number]   MUSCLE BIOPSY      Home Medications:  Allergies as of 06/17/2022       Reactions   Other Anaphylaxis   Artificial sweetener   Adhesive [tape]    Fd&c Yellow #5 (tartrazine)    Yellow Dyes (non-tartrazine) Other (See Comments)   Arthritis pains - it is tartrazine Yellow #5        Medication List        Accurate as of June 17, 2022   1:48 PM. If you have any questions, ask your nurse or doctor.          albuterol 108 (90 Base) MCG/ACT inhaler Commonly known as: VENTOLIN HFA Inhale 2 puffs into the lungs every 6 (six) hours as needed.   aspirin 325 MG tablet Take by mouth.   CALCIUM-VITAMIN D3 PO Take by mouth.   cetirizine 10 MG tablet Commonly known as: ZYRTEC Take 1 tablet (10 mg total) by mouth daily as needed for allergies.   ciprofloxacin-dexamethasone OTIC suspension Commonly known as: Ciprodex Place 4 drops into the left ear 2 (two) times daily. X4-7 days   fluticasone 50 MCG/ACT nasal spray Commonly known as: FLONASE Place 2 sprays into both nostrils daily. prn   mirabegron ER 50 MG Tb24 tablet Commonly known as: MYRBETRIQ Take 1 tablet (50 mg total) by mouth daily.   multivitamin tablet Take 1 tablet by mouth daily.   neomycin-polymyxin-hydrocortisone OTIC solution Commonly known as: CORTISPORIN Place 4 drops into the left ear 3 (three) times daily. X4-7 days, ciprodex too expensive   PRESERVISION AREDS 2 PO Take by mouth.   rosuvastatin 5 MG tablet Commonly known as: CRESTOR Take 1 tablet (5 mg total) by mouth daily.   VITAMIN D3 PO Take by mouth.  Allergies:  Allergies  Allergen Reactions   Other Anaphylaxis    Artificial sweetener   Adhesive [Tape]    Fd&C Yellow #5 (Tartrazine)    Yellow Dyes (Non-Tartrazine) Other (See Comments)    Arthritis pains - it is tartrazine Yellow #5    Family History: Family History  Problem Relation Age of Onset   Arthritis Mother    Stroke Mother    Diabetes Mother    Hypertension Mother    Arthritis Father    Stroke Father    Diabetes Paternal Grandmother    Cancer Paternal Uncle        colon   Heart disease Other        maternal and paternal side   Breast cancer Paternal Aunt    Breast cancer Cousin    Breast cancer Cousin        female cousin   Breast cancer Cousin     Social History:  reports that she has never  smoked. She has never been exposed to tobacco smoke. She has never used smokeless tobacco. She reports that she does not currently use alcohol. She reports that she does not use drugs.  ROS:                                        Physical Exam: There were no vitals taken for this visit.  Constitutional:  Alert and oriented, No acute distress. HEENT: [place] AT, moist mucus membranes.  Trachea midline, no masses.   Laboratory Data: Lab Results  Component Value Date   WBC 5.5 06/10/2022   HGB 13.2 06/10/2022   HCT 40.8 06/10/2022   MCV 90.5 06/10/2022   PLT 274.0 06/10/2022    Lab Results  Component Value Date   CREATININE 0.70 06/10/2022    No results found for: "PSA"  No results found for: "TESTOSTERONE"  Lab Results  Component Value Date   HGBA1C 5.4 07/15/2017    Urinalysis    Component Value Date/Time   COLORURINE YELLOW 01/02/2022 0909   APPEARANCEUR CLEAR 01/02/2022 0909   APPEARANCEUR Clear 04/28/2019 1333   LABSPEC <=1.005 (A) 01/02/2022 0909   LABSPEC 1.015 12/17/2011 1804   PHURINE 6.0 01/02/2022 0909   GLUCOSEU NEGATIVE 01/02/2022 0909   HGBUR NEGATIVE 01/02/2022 0909   BILIRUBINUR NEGATIVE 01/02/2022 0909   BILIRUBINUR Negative 04/28/2019 1333   BILIRUBINUR Negative 12/17/2011 1804   KETONESUR NEGATIVE 01/02/2022 0909   PROTEINUR Negative 04/28/2019 1333   PROTEINUR Negative 12/17/2011 1804   UROBILINOGEN 0.2 01/02/2022 0909   NITRITE NEGATIVE 01/02/2022 0909   LEUKOCYTESUR NEGATIVE 01/02/2022 0909   LEUKOCYTESUR Negative 12/17/2011 1804    Pertinent Imaging:   Assessment & Plan: Patient clinically has an overactive bladder and likely has an element of a nocturnal diuresis.  When she holds it too long during the day she can have uncommon can that over syndrome.  Reassess for cystoscopy in 6 weeks on Gemtesa samples and prescription.  Antimuscarinics and third line therapies especially percutaneous tibial nerve stimulation may  be very good options to consider based on severity of symptoms.  Botox and InterStim or other options.  I do not think at this stage she needs urodynamics  Patient can be sitting in bed and also get some urgency possibly triggered by standing.  She has gone back to physical therapy but they held it for now because she is having a little bit  of lightheadedness.  This should be taken into consideration if we give her antimuscarinics.  There are no diagnoses linked to this encounter.  No follow-ups on file.  Reece Packer, MD  [facility] [address] [place],  [address] [phone]

## 2022-06-18 ENCOUNTER — Institutional Professional Consult (permissible substitution): Payer: PPO | Admitting: Internal Medicine

## 2022-06-18 NOTE — Therapy (Signed)
OUTPATIENT PHYSICAL THERAPY VESTIBULAR TREATMENT/RECERTIFICATION  Patient Name: Lynn Mann MRN: [id number] DOB:19-Jul-1951, 71 y.o., female Today's Date: 06/24/2022  PCP: Einar Pheasant, MD REFERRING PROVIDER: Einar Pheasant, MD   PT End of Session - 06/24/22 0934     Visit Number 11    Number of Visits 17    Date for PT Re-Evaluation 06/10/22    Authorization Type eval: 04/15/22    PT Start Time 0935    PT Stop Time 1015    PT Time Calculation (min) 40 min    Activity Tolerance Patient tolerated treatment well    Behavior During Therapy [facility] for tasks assessed/performed             Past Medical History:  Diagnosis Date   Arthritis    C. difficile diarrhea    age 71s-40s    Chicken pox    Cholecystitis 11/2011   Did not require sgy - Dr. Staci Acosta - [facility]  (cholelithiasis)   H/O Clostridium difficile infection    IBS (irritable bowel syndrome)    MRSA exposure 2005   Spider bite   MVP (mitral valve prolapse)    Stable - Dr. Ubaldo Glassing   Rheumatic fever    Past Surgical History:  Procedure Laterality Date   CATARACT EXTRACTION  [id number]   MUSCLE BIOPSY     Patient Active Problem List   Diagnosis Date Noted   Estrogen deficiency 06/23/2022   Nocturia 03/12/2022   Snoring 03/12/2022   Muscle weakness 03/11/2022   Joint ache 02/11/2022   Ankle pain 02/05/2022   Fever 12/26/2021   Abdominal pain 12/26/2021   Cough 09/07/2021   Right shoulder pain 07/15/2021   Hematoma 02/11/2021   Ankle swelling 11/26/2020   Low back pain 11/22/2020   Hip pain, right 11/22/2020   Light headedness 06/24/2020   Hearing loss 06/24/2020   Binocular vision disorder with diplopia 06/24/2020   Stress 05/17/2020   Chest pain 05/16/2020   Welcome to Medicare preventive visit 06/20/2019   Viral syndrome 01/30/2019   Itching 07/21/2017   Asthma 07/16/2016   Urinary incontinence 07/16/2016   SOB (shortness of breath) 04/01/2016   Bilateral shoulder pain 02/24/2016   Fatigue  01/28/2016   Neck fullness 01/28/2016   Routine general medical examination at a health care facility 07/25/2015   Health care maintenance 10/09/2014   Neck pain 11/26/2013   Hypercholesterolemia 11/26/2013   History of colonic polyps 05/11/2013   Hyperbilirubinemia 01/18/2013   GERD (gastroesophageal reflux disease) 12/03/2012   Cholelithiasis 11/07/2012   IBS (irritable bowel syndrome) 11/07/2012   History of rheumatic fever 08/28/2012   MVP (mitral valve prolapse) 08/28/2012    PCP: Einar Pheasant, MD  REFERRING PROVIDER: Einar Pheasant, MD  REFERRING DIAGNOSIS: R42 (ICD-10-CM) - Dizziness  THERAPY DIAG: Dizziness and giddiness  Muscle weakness (generalized)  Unsteadiness on feet  RATIONALE FOR EVALUATION AND TREATMENT: Rehabilitation  ONSET DATE: 09/26/21 (approximate)  FOLLOW UP APPT WITH PROVIDER: Not asked  FROM INITIAL EVALUATION (04/15/22)  SUBJECTIVE:   Chief Complaint:  Dizziness  Pertinent History Pt reports that sometime in March/April she woke up with vertigo. Her symptoms lasted the rest of the day. She eventually scheduled with [facility] where they performed the Epley Maneuver which resolved her vertigo. However, pt reports that she still has persistent "spatial disorientation." At some point after the initial vertigo onset pt started to notice double vision. She went to see her eye doctor and eventually ended up with a referral to neurology. She had  a negative MRI Brain without contrast for any brainstem lesion and negative Myasthenia Gravis labs. She reports that neurology mentioned a referral to neurophthalmology but she never received a referral. She has also seen cardiology to work up her symptoms. Holter monitor revealed an average rate of 68. There were no prolonged pauses. Her heart rate gets in the upper 30s during sleeping hours and as high as 140 during activity. There are no pauses or significant bradycardic episodes during waking hours. Sleep  study which showed no significant sleep apnea. She underwent exercise stress echo where she went just over 4 minutes with no arrhythmia. Echo 11/21 images showed normal resting LV function with what appeared to be augmentation of all segments of the cardiac myocardium with stress. No significant valvular normalities. This did not suggest significant ischemia. She denies any further vertigo but notices that when she rolls over she "feels like she is sinking." She recently finished pelvic floor therapy. She was seen in the past by this therapist for R hip/groin pain. She has also done vestibular therapy in the past for dizziness/imbalance. Patient's husband passed unexpectedly 03/26/20 but pt reports that she is currently doing well without any significant stress.  Brain MRI: 11/11/21 IMPRESSION: No evidence of acute intracranial abnormality.   Mild chronic small vessel ischemic changes within the cerebral white matter, stable from the prior brain MRI of 09/14/2020.  MRA Head: 01/05/22 IMPRESSION: 1. No intracranial large vessel occlusion or proximal high-grade arterial stenosis. 2. 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 A CTA of the head may be helpful for further evaluation.  Description of dizziness: unsteadiness and visual disorientation ("room looks glassy/fish-bowly") Frequency: "mostly constant." Pt states that it does change with her environment Duration: "mostly constant" Symptom nature: constant Progression of symptoms since onset: no change History of similar episodes: Yes, pt reports that years ago she was told that she had an issue with her eye tracking but it never created any issues.   Auditory complaints (tinnitus, pain, drainage, hearing loss, aural fullness): Yes, slight tinnitus bilaterally. Pt reports some "stuffiness" in her ears the last couple days Vision changes (diplopia, visual field loss, recent changes, recent  eye exam): Yes, pt complaining of double vision Chest pain/palpitations: No History of head injury/concussion: No Stress/anxiety: No Headaches/migraines: Prior history of migraines but after her dtr was born she hasn't had headaches  Has patient fallen in last 6 months? 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 Positive: Reports getting hot at night  PRECAUTIONS: None  WEIGHT BEARING RESTRICTIONS No  LIVING ENVIRONMENT: Lives with: lives alone Lives in: House/apartment Stairs: 1 step to enter with a door handle she can hold   PATIENT GOALS: Improve confidence with walking so she can walk more, improve her ability to play with her grandchildren, improve stability so she can help her older friends;   OBJECTIVE EXAMINATION   NEUROLOGICAL SCREEN: (2+ unless otherwise noted.) N=normal  Ab=abnormal  Level Dermatome R L Myotome R L Reflex R L  C3 Anterior Neck N N Sidebend C2-3 N N Jaw CN V  C4 Top of Shoulder N N Shoulder Shrug C4 N N Hoffman's UMN    C5 Lateral Upper Arm N N Shoulder ABD C4-5 N N Biceps C5-6    C6 Lateral Arm/ Thumb N N Arm Flex/ Wrist Ext C5-6 N N Brachiorad. C5-6    C7 Middle Finger N N Arm Ext//Wrist Flex C6-7 N N Triceps C7    C8 4th & 5th Finger N N Flex/ Ext Carpi Ulnaris C8 N N Patellar (L3-4)    T1 Medial Arm N N Interossei T1 N N Gastrocnemius    L2 Medial thigh/groin N N Illiopsoas (L2-3) N N     L3 Lower thigh/med.knee N N Quadriceps (L3-4) N N     L4 Medial leg/lat thigh N N Tibialis Ant (L4-5) N N     L5 Lat. leg & dorsal foot N N EHL (L5) N N     S1  post/lat foot/thigh/leg N N Gastrocnemius (S1-2) N N     S2 Post./med. thigh & leg N N Hamstrings (L4-S3) N N      CRANIAL NERVES II, III, IV, VI: Pupils equal and reactive to light, visual acuity and visual fields are intact, extraocular muscles are intact  V: Facial sensation is intact and symmetric bilaterally  VII: Facial strength is intact and symmetric bilaterally  VIII: Hearing is normal as tested by gross conversation IX, X: Palate elevates midline, normal phonation, uvula midline XI: Shoulder shrug strength is intact  XII: Tongue protrudes midline   SOMATOSENSORY Grossly intact to light touch bilateral UEs/LEs as determined by testing dermatomes C2-T2 and L2-S2. Proprioception and hot/cold testing deferred on this date.  COORDINATION Finger to Nose: Normal Heel to Shin: Normal Pronator Drift: Negative Rapid Alternating Movements: Normal Finger to Thumb Opposition: Normal   RANGE OF MOTION Cervical Spine AROM grossly WFL and painless in all directions. No focal deficits in AROM noted in [place] strength WNL without focal deficits with the exception of 4/5 bilateral ankle dorsiflexion and 4/5 R shoulder flexion  TRANSFERS/GAIT Independent for transfers and ambulation without assistive device   PATIENT SURVEYS FOTO: To be completed (pt arrived late so unable to complete)  ABC: To be completed  Clinical Test of Sensory Interaction for Balance (CTSIB): Deferred   OCULOMOTOR / VESTIBULAR TESTING  Oculomotor Exam- Room Light  Findings Comments  Ocular Alignment normal   Ocular ROM normal   Spontaneous Nystagmus normal   Gaze-Holding Nystagmus normal   End-Gaze Nystagmus normal   Vergence (normal 2-3") abnormal Pt reporting blurriness >24" from her nose  Smooth Pursuit abnormal Saccadic intrusions noted  Cross-Cover Test normal   Saccades normal   VOR Cancellation normal   Left Head Impulse normal   Right Head Impulse abnormal  Corrective saccade required  Static Acuity not examined   Dynamic Acuity not examined     Oculomotor Exam- Fixation Suppressed: Deferred  BPPV TESTS:  Symptoms Duration Intensity Nystagmus  L Dix-Hallpike Dizziness and visual disorientation   Downbeating  R Dix-Hallpike Visual disorientation "things are out of place"   None  L Head Roll Mild nausea   None  R Head Roll Nausea, spinning   After approximately 20 seconds pt starts with vigorous upbeating R torsional nystagmus x 5-7 seconds;  L Sidelying Test      R Sidelying Test      (blank = not tested)  FUNCTIONAL OUTCOME MEASURES   Results Comments  BERG    DGI    FGA  TUG    5TSTS    6 Minute Walk Test    10 Meter Gait Speed    ABC Scale    DHI    (blank = not tested)   TODAY'S TREATMENT    SUBJECTIVE: Pt states that she is doing alright today. No more episodes of vertigo. She still has occasional dizziness but overall improved since starting therapy. No pain reported upon arrival today.   Pain: No pain reported   Ther-ex  Hooklying marching x 15 BLE; Hooklying clams with red tband x 15 BLE; Hooklying adductor ball squeezes x 15 BLE; Supine SLR x 5 BLE; Hooklying bridges with arms at side x 10; Sidelying straight knee hip abduction 2 x 10 RLE, x 9, x 8 LLE; Seated hip flexion marching x 10; Seated clams with red tband x 10; Seated adductor ball squeeze x 10; Seated heel raises with ball squeeze between ankles to encourage inversion strengthening x 15; Standing hip flexion marching x 10 BLE; Standing hip abduction x 10 BLE; Standing hamstring curls x 10 BLE; Standing mini squats x 10; HEP education and discharge instructions provided;    PATIENT EDUCATION:  Education details: Pt educated throughout session about proper posture and technique with exercises. Improved exercise technique, movement at target joints, use of target muscles after min to mod verbal, visual, tactile cues, updated HEP Person  educated: Patient Education method: Explanation and handout Education comprehension: verbalized understanding and returned demonstration   HOME EXERCISE PROGRAM Access Code: [email or web address] URL: [email or web address] Date: 06/06/2022 Prepared by: Roxana Hires  Exercises - Seated Horizontal Smooth Pursuit  - 2-3 x daily - 7 x weekly - 1 reps - 60s hold - Seated Vertical Smooth Pursuit  - 2-3 x daily - 7 x weekly - 1 reps - 60s hold - Seated Diagonal Smooth Pursuit  - 2-3 x daily - 7 x weekly - 1 reps - 60s hold - Seated Horizontal Saccades  - 2-3 x daily - 7 x weekly - 1 reps - 60s hold - Seated Vertical Saccades  - 2-3 x daily - 7 x weekly - 1 reps - 60s hold - Seated Diagonal Saccades  - 2-3 x daily - 7 x weekly - 1 reps - 60s hold - Pencil Pushups  - 2-3 x daily - 7 x weekly - 1 reps - 60s hold - Brock String  - 2-3 x daily - 7 x weekly - 1 reps - 60s hold - Seated Gaze Stabilization with Head Rotation  - 2-3 x daily - 7 x weekly - 3 reps - 60s hold - Seated Hip Abduction with Resistance  - 1 x daily - 7 x weekly - 2 sets - 10 reps - 3s hold - Seated Hip Adduction Isometrics with Ball  - 1 x daily - 7 x weekly - 2 sets - 10 reps - 3s hold - Standing Hip Abduction with Counter Support  - 1 x daily - 7 x weekly - 2 sets - 10 reps - 3s hold - Standing Hip Abduction with Counter Support (Mirrored)  - 1 x daily - 7 x weekly - 2 sets - 10 reps - 3s hold - Supine Bridge  - 1 x daily - 7 x weekly - 2 sets - 10 reps - 3s hold   ASSESSMENT:  CLINICAL IMPRESSION: Regressed LE strengthening today to mat table exercises due to reported severe soreness after the last therapy session. It is unlikely her soreness was related to taking Crestor as this is  not a new medication for patient however if she is concerned she can contact her PCP. Updated HEP to minimize exercise which might cause significant delayed onset muscle soreness. Focus for future sessions will continue to be BLE  strengthening with focus on HEP reinforcement/updates. Pt encouraged to follow-up as scheduled. She will benefit from skilled PT to address above impairments and improve overall function.  REHAB POTENTIAL: Excellent  CLINICAL DECISION MAKING: Evolving/moderate complexity  EVALUATION COMPLEXITY: Moderate   GOALS: Goals reviewed with patient? No  SHORT TERM GOALS: Target date: 05/13/2022  Pt will be independent with HEP for dizziness in order to decrease symptoms, improve balance,decrease fall risk, and improve function at home. Baseline: Goal status: INITIAL   LONG TERM GOALS: Target date: 06/10/2022  Pt will increase FOTO to at least 59 in order to demonstrate significant improvement in function at home related to dizziness.  Baseline: 04/15/22: To be completed; 04/23/22: 39; 05/30/22: 49 Goal status: PARTIALLY MET  2.  Pt will improve ABC by at least 13% in order to demonstrate clinically significant improvement in balance confidence.      Baseline: 04/15/22: To be completed; 05/30/22: 82.5% Goal status: WITHIN NORMAL LIMITS  3. Pt will report at least 90% improvement in her dizziness symptoms in order to improve her ability to safely walk, play with her grandchildren, and help her older friends at church.      Baseline: 05/30/22: 30-40% improvement Goal status: PARTIALLY MET   PLAN:  PT FREQUENCY: 1-2x/week  PT DURATION: 8 weeks  PLANNED INTERVENTIONS: Therapeutic exercises, Therapeutic activity, Neuromuscular re-education, Balance training, Gait training, Patient/Family education, Joint manipulation, Joint mobilization, Canalith repositioning, Aquatic Therapy, Dry Needling, Cognitive remediation, Electrical stimulation, Spinal manipulation, Spinal mobilization, Cryotherapy, Moist heat, Traction, Ultrasound, Ionotophoresis 61m/ml Dexamethasone, and Manual therapy  PLAN FOR NEXT SESSION: BLE strengthening, modify/update HEP as appropriate  JLyndel SafeHuprich PT, DPT, GCS   Marcelles Clinard 06/24/2022, 10:51 AM

## 2022-06-21 ENCOUNTER — Telehealth: Payer: Self-pay

## 2022-06-21 NOTE — Telephone Encounter (Signed)
Lm for patient to ask if she has had previous sleep study.  

## 2022-06-21 NOTE — Telephone Encounter (Signed)
Spoke to patient and verified that she does not currently wear cpap. She would like in lab sleep study. This will be discussed with Beth at upcoming appt.  Nothing further needed.

## 2022-06-23 ENCOUNTER — Encounter: Payer: Self-pay | Admitting: Internal Medicine

## 2022-06-23 DIAGNOSIS — E2839 Other primary ovarian failure: Secondary | ICD-10-CM | POA: Insufficient documentation

## 2022-06-23 NOTE — Assessment & Plan Note (Signed)
Schedule bone density.    

## 2022-06-23 NOTE — Assessment & Plan Note (Signed)
On crestor.  Low cholesterol diet and exercise.  Follow lipid panel and liver function tests.   

## 2022-06-23 NOTE — Assessment & Plan Note (Signed)
Colonoscopy 08/2017 - tubular adenoma.  Recommended f/u colonoscopy in 5 years.

## 2022-06-23 NOTE — Assessment & Plan Note (Signed)
Overall appears to be handling things relatively well.  Follow.   

## 2022-06-23 NOTE — Assessment & Plan Note (Signed)
Discussed weakness concerns.  Reports being unable to get out of chair without using her hands.  Discussed neurology evaluation - possible EMT testing, etc.  PT.  Check ck.

## 2022-06-23 NOTE — Assessment & Plan Note (Signed)
Breathing overall appears to be stable.  Lungs clear.   

## 2022-06-23 NOTE — Assessment & Plan Note (Signed)
Is followed by optometry.  Recently evaluated by neurology.  Labs unrevealing (including MG labs).  MRI - no acute abnormality.  Continue f/u with neurology.

## 2022-06-24 ENCOUNTER — Encounter: Payer: Self-pay | Admitting: Primary Care

## 2022-06-24 ENCOUNTER — Ambulatory Visit: Payer: PPO

## 2022-06-24 ENCOUNTER — Ambulatory Visit (INDEPENDENT_AMBULATORY_CARE_PROVIDER_SITE_OTHER): Payer: PPO | Admitting: Primary Care

## 2022-06-24 VITALS — BP 118/78 | HR 88 | Temp 97.7°F | Ht 67.0 in | Wt 180.0 lb

## 2022-06-24 DIAGNOSIS — R42 Dizziness and giddiness: Secondary | ICD-10-CM

## 2022-06-24 DIAGNOSIS — M6281 Muscle weakness (generalized): Secondary | ICD-10-CM

## 2022-06-24 DIAGNOSIS — R0683 Snoring: Secondary | ICD-10-CM | POA: Diagnosis not present

## 2022-06-24 DIAGNOSIS — R2681 Unsteadiness on feet: Secondary | ICD-10-CM

## 2022-06-24 NOTE — Progress Notes (Signed)
$'@Patient'N$  ID: Lynn Mann, female    DOB: [birth year], 71 y.o.   MRN: [id number]  Chief Complaint  Patient presents with   Consult    Sleep study in the past.  Has been told she snores. Tired a lot during the day.     Referring provider: Einar Pheasant, MD  HPI: 71 year old female, never smoked. PMH significant for mitral valve prolapse, asthma, GERD, IBS, hypercholesterolemia. Former patient of Dr. Alva Garnet, followed for hx mild intermittent asthma   06/24/2022 Patient presents today for sleep consult. She has symptoms of snoring and nocturia. She has a lot of fatigue symptoms as well. She has been told by her grandchildren that she snores. She has always been a night owl. She has never really had a regular sleep routine. It does not take her long to fall asleep. 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 She starts her day anywhere from 5am-8am. She had a sleep study in 2019 that she reports was negative for sleep apnea.  She is getting ready to re-start pelvic therapy, her therapist recommended getting repeat sleep study   Sleep questionnaire Symptoms- Snoring,  Prior sleep study- 2019  Bedtime- 11pm-2am  Time to fall asleep- immediately  Nocturnal awakenings- 3-4 times  Start of day- 5-8am  Weight changes- 10 lbs  Do you operate heavy machinery- no CPAP use- no Oxygen use- no Epworth- 6   Allergies  Allergen Reactions   Other Anaphylaxis    Artificial sweetener   Adhesive [Tape]    Fd&C Yellow #5 (Tartrazine)    Yellow Dyes (Non-Tartrazine) Other (See Comments)    Arthritis pains - it is tartrazine Yellow #5    Immunization History  Administered Date(s) Administered   Moderna Sars-Covid-2 Vaccination 10/28/2019, 11/18/2019   Tdap 07/25/2015    Past Medical History:  Diagnosis Date   Arthritis    C. difficile diarrhea    age 48s-40s     Chicken pox    Cholecystitis 11/2011   Did not require sgy - Dr. Staci Acosta - [facility]  (cholelithiasis)   H/O Clostridium difficile infection    IBS (irritable bowel syndrome)    MRSA exposure 2005   Spider bite   MVP (mitral valve prolapse)    Stable - Dr. Ubaldo Glassing   Rheumatic fever     Tobacco History: Social History   Tobacco Use  Smoking Status Never   Passive exposure: Never  Smokeless Tobacco Never   Counseling given: Not Answered   Outpatient Medications Prior to Visit  Medication Sig Dispense Refill   albuterol (VENTOLIN HFA) 108 (90 Base) MCG/ACT inhaler Inhale 2 puffs into the lungs every 6 (six) hours as needed.     aspirin 325 MG tablet Take 325 mg by mouth. Takes it occasionally. Has not taken lately     Calcium Carbonate-Vitamin D (CALCIUM-VITAMIN D3 PO) Take by mouth.     cetirizine (ZYRTEC) 10 MG tablet Take 1 tablet (10 mg total) by mouth daily as needed for allergies. 30 tablet 11   Cholecalciferol (VITAMIN D3 PO) Take 1 tablet by mouth daily.     fluticasone (FLONASE) 50 MCG/ACT nasal spray Place 2 sprays into both nostrils daily. prn 16 g 11   Multiple Vitamin (MULTIVITAMIN) tablet Take 1 tablet by mouth daily.     Multiple Vitamins-Minerals (PRESERVISION AREDS 2 PO) Take  by mouth.     Polyvinyl Alcohol-Povidone (REFRESH OP) Apply 1 drop to eye as needed.     Vibegron (GEMTESA) 75 MG TABS Take 75 mg by mouth daily. 28 tablet 0   Vibegron (GEMTESA) 75 MG TABS Take 75 mg by mouth daily. 30 tablet 11   rosuvastatin (CRESTOR) 5 MG tablet Take 1 tablet (5 mg total) by mouth daily. 30 tablet 2   ciprofloxacin-dexamethasone (CIPRODEX) OTIC suspension Place 4 drops into the left ear 2 (two) times daily. X4-7 days (Patient not taking: Reported on 06/24/2022) 7.5 mL 0   neomycin-polymyxin-hydrocortisone (CORTISPORIN) OTIC solution Place 4 drops into the left ear 3 (three) times daily. X4-7 days, ciprodex too expensive (Patient not taking: Reported on 06/24/2022) 10 mL 0    No facility-administered medications prior to visit.      Review of Systems  Review of Systems  Respiratory: Negative.    Cardiovascular: Negative.   Psychiatric/Behavioral:  Positive for sleep disturbance.      Physical Exam  BP 118/78 (BP Location: Left Arm, Cuff Size: Normal)   Pulse 88   Temp 97.7 F (36.5 C)   Ht '5\' 7"'$  (1.702 m)   Wt 180 lb (81.6 kg)   SpO2 95%   BMI 28.19 kg/m  Physical Exam Constitutional:      Appearance: Normal appearance.  HENT:     Head: Normocephalic and atraumatic.     Mouth/Throat:     Mouth: Mucous membranes are moist.     Pharynx: Oropharynx is clear.  Cardiovascular:     Rate and Rhythm: Normal rate and regular rhythm.  Pulmonary:     Effort: Pulmonary effort is normal.     Breath sounds: Normal breath sounds. No wheezing, rhonchi or rales.  Musculoskeletal:        General: Normal range of motion.  Skin:    General: Skin is warm and dry.  Neurological:     General: No focal deficit present.     Mental Status: She is alert and oriented to person, place, and time. Mental status is at baseline.  Psychiatric:        Mood and Affect: Mood normal.        Behavior: Behavior normal.        Thought Content: Thought content normal.        Judgment: Judgment normal.      Lab Results:  CBC    Component Value Date/Time   WBC 5.5 06/10/2022 0815   RBC 4.50 06/10/2022 0815   HGB 13.2 06/10/2022 0815   HGB 14.1 12/17/2011 1654   HCT 40.8 06/10/2022 0815   HCT 42.5 12/17/2011 1654   PLT 274.0 06/10/2022 0815   PLT 271 12/17/2011 1654   MCV 90.5 06/10/2022 0815   MCV 88 12/17/2011 1654   MCH 28.9 09/07/2021 1444   MCHC 32.4 06/10/2022 0815   RDW 13.8 06/10/2022 0815   RDW 13.3 12/17/2011 1654   LYMPHSABS 1.9 06/10/2022 0815   MONOABS 0.5 06/10/2022 0815   EOSABS 0.1 06/10/2022 0815   BASOSABS 0.0 06/10/2022 0815    BMET    Component Value Date/Time   NA 142 06/10/2022 0815   NA 143 12/17/2011 1654   K 4.8  06/10/2022 0815   K 3.9 12/17/2011 1654   CL 107 06/10/2022 0815   CL 109 (H) 12/17/2011 1654   CO2 28 06/10/2022 0815   CO2 26 12/17/2011 1654   GLUCOSE 91 06/10/2022 0815   GLUCOSE 58 (L) 12/17/2011 1654  BUN 16 06/10/2022 0815   BUN 8 12/17/2011 1654   CREATININE 0.70 06/10/2022 0815   CREATININE 0.73 09/07/2021 1444   CALCIUM 9.4 06/10/2022 0815   CALCIUM 8.8 12/17/2011 1654   GFRNONAA >60 01/31/2021 1850   GFRNONAA >60 12/17/2011 1654   GFRAA >60 06/17/2018 1040   GFRAA >60 12/17/2011 1654    BNP No results found for: "BNP"  ProBNP No results found for: "PROBNP"  Imaging: No results found.   Assessment & Plan:   Snoring - Patient has symptoms of snoring and nocturia.  Associated daytime fatigue.  Sleep study in 2019 reportedly negative.  Patient needs home sleep study to evaluate for underlying obstructive sleep apnea.  Discussed risks of untreated sleep apnea including cardiac arrhythmias, pulmonary hypertension, diabetes and stroke.  We also reviewed treatment options including weight loss, oral appliance, CPAP therapy or referral to ENT for possible surgical options.  Encouraged weight loss efforts and side sleeping position.  Advised against driving if experiencing excessive daytime sleepiness fatigue.  Follow-up 1 to 2 weeks after sleep study to review results and treatment options if needed.   Martyn Ehrich, NP 07/15/2022

## 2022-06-24 NOTE — Patient Instructions (Signed)
Orders: Home sleep test re: snoring  Follow-up: Call 1-2 weeks after having sleep study to get results and schedule visit if needed   Sleep Apnea Sleep apnea is a condition in which breathing pauses or becomes shallow during sleep. People with sleep apnea usually snore loudly. They may have times when they gasp and stop breathing for 10 seconds or more during sleep. This may happen many times during the night. Sleep apnea disrupts your sleep and keeps your body from getting the rest that it needs. This condition can increase your risk of certain health problems, including: Heart attack. Stroke. Obesity. Type 2 diabetes. Heart failure. Irregular heartbeat. High blood pressure. The goal of treatment is to help you breathe normally again. What are the causes?  The most common cause of sleep apnea is a collapsed or blocked airway. There are three kinds of sleep apnea: Obstructive sleep apnea. This kind is caused by a blocked or collapsed airway. Central sleep apnea. This kind happens when the part of the brain that controls breathing does not send the correct signals to the muscles that control breathing. Mixed sleep apnea. This is a combination of obstructive and central sleep apnea. What increases the risk? You are more likely to develop this condition if you: Are overweight. Smoke. Have a smaller than normal airway. Are older. Are female. Drink alcohol. Take sedatives or tranquilizers. Have a family history of sleep apnea. Have a tongue or tonsils that are larger than normal. What are the signs or symptoms? Symptoms of this condition include: Trouble staying asleep. Loud snoring. Morning headaches. Waking up gasping. Dry mouth or sore throat in the morning. Daytime sleepiness and tiredness. If you have daytime fatigue because of sleep apnea, you may be more likely to have: Trouble concentrating. Forgetfulness. Irritability or mood swings. Personality changes. Feelings of  depression. Sexual dysfunction. This may include loss of interest if you are female, or erectile dysfunction if you are female. How is this diagnosed? This condition may be diagnosed with: A medical history. A physical exam. A series of tests that are done while you are sleeping (sleep study). These tests are usually done in a sleep lab, but they may also be done at home. How is this treated? Treatment for this condition aims to restore normal breathing and to ease symptoms during sleep. It may involve managing health issues that can affect breathing, such as high blood pressure or obesity. Treatment may include: Sleeping on your side. Using a decongestant if you have nasal congestion. Avoiding the use of depressants, including alcohol, sedatives, and narcotics. Losing weight if you are overweight. Making changes to your diet. Quitting smoking. Using a device to open your airway while you sleep, such as: An oral appliance. This is a custom-made mouthpiece that shifts your lower jaw forward. A continuous positive airway pressure (CPAP) device. This device blows air through a mask when you breathe out (exhale). A nasal expiratory positive airway pressure (EPAP) device. This device has valves that you put into each nostril. A bi-level positive airway pressure (BIPAP) device. This device blows air through a mask when you breathe in (inhale) and breathe out (exhale). Having surgery if other treatments do not work. During surgery, excess tissue is removed to create a wider airway. Follow these instructions at home: Lifestyle Make any lifestyle changes that your health care provider recommends. Eat a healthy, well-balanced diet. Take steps to lose weight if you are overweight. Avoid using depressants, including alcohol, sedatives, and narcotics. Do not use  any products that contain nicotine or tobacco. These products include cigarettes, chewing tobacco, and vaping devices, such as e-cigarettes. If  you need help quitting, ask your health care provider. General instructions Take over-the-counter and prescription medicines only as told by your health care provider. If you were given a device to open your airway while you sleep, use it only as told by your health care provider. If you are having surgery, make sure to tell your health care provider you have sleep apnea. You may need to bring your device with you. Keep all follow-up visits. This is important. Contact a health care provider if: The device that you received to open your airway during sleep is uncomfortable or does not seem to be working. Your symptoms do not improve. Your symptoms get worse. Get help right away if: You develop: Chest pain. Shortness of breath. Discomfort in your back, arms, or stomach. You have: Trouble speaking. Weakness on one side of your body. Drooping in your face. These symptoms may represent a serious problem that is an emergency. Do not wait to see if the symptoms will go away. Get medical help right away. Call your local emergency services (911 in the [place] Do not drive yourself to the hospital. Summary Sleep apnea is a condition in which breathing pauses or becomes shallow during sleep. The most common cause is a collapsed or blocked airway. The goal of treatment is to restore normal breathing and to ease symptoms during sleep. This information is not intended to replace advice given to you by your health care provider. Make sure you discuss any questions you have with your health care provider. Document Revised: 02/21/2021 Document Reviewed: 06/23/2020 Elsevier Patient Education  [place].

## 2022-06-27 ENCOUNTER — Telehealth: Payer: Self-pay | Admitting: Internal Medicine

## 2022-06-27 ENCOUNTER — Other Ambulatory Visit: Payer: Self-pay

## 2022-06-27 DIAGNOSIS — R32 Unspecified urinary incontinence: Secondary | ICD-10-CM

## 2022-06-27 NOTE — Addendum Note (Signed)
Addended by: Alisa Graff on: 06/27/2022 09:42 PM   Modules accepted: Orders

## 2022-06-27 NOTE — Telephone Encounter (Signed)
Pt called stating she would like a referral back to Dr. Annamaria Boots for pelvic therapy

## 2022-06-27 NOTE — Telephone Encounter (Signed)
Order placed for referral to PT

## 2022-07-01 ENCOUNTER — Other Ambulatory Visit: Payer: Self-pay

## 2022-07-01 ENCOUNTER — Ambulatory Visit (INDEPENDENT_AMBULATORY_CARE_PROVIDER_SITE_OTHER): Payer: PPO

## 2022-07-01 VITALS — Ht 67.0 in | Wt 180.0 lb

## 2022-07-01 DIAGNOSIS — Z0000 Encounter for general adult medical examination without abnormal findings: Secondary | ICD-10-CM | POA: Diagnosis not present

## 2022-07-01 MED ORDER — ROSUVASTATIN CALCIUM 5 MG PO TABS: 5.0000 mg | ORAL_TABLET | Freq: Every day | ORAL | 2 refills | Status: DC

## 2022-07-01 NOTE — Telephone Encounter (Signed)
Patient request refill. Last OV 06/13/22 as cpe.  Crestor '5mg'$  last filled 03/14/22, Refills 2. Medication pended for approval.

## 2022-07-01 NOTE — Progress Notes (Signed)
Subjective:   Lynn Mann is a 70 y.o. female who presents for Medicare Annual (Subsequent) preventive examination.  Review of Systems    No ROS.  Medicare Wellness Virtual Visit.  Visual/audio telehealth visit, UTA vital signs.   See social history for additional risk factors.   Cardiac Risk Factors include: advanced age (>39mn, >>68women)     Objective:    Today's Vitals   07/01/22 1337  Weight: 180 lb (81.6 kg)  Height: '5\' 7"'$  (1.702 m)   Body mass index is 28.19 kg/m.     07/01/2022    1:51 PM 02/12/2022    2:28 PM 09/26/2021    9:03 PM 06/27/2021    2:26 PM 01/31/2021    5:44 PM 01/20/2021   10:22 AM 06/26/2020    2:27 PM  Advanced Directives  Does Patient Have a Medical Advance Directive? No No No No No No No  Would patient like information on creating a medical advance directive? No - Patient declined   No - Patient declined   No - Patient declined    Current Medications (verified) Outpatient Encounter Medications as of 07/01/2022  Medication Sig   albuterol (VENTOLIN HFA) 108 (90 Base) MCG/ACT inhaler Inhale 2 puffs into the lungs every 6 (six) hours as needed.   aspirin 325 MG tablet Take 325 mg by mouth. Takes it occasionally. Has not taken lately   Calcium Carbonate-Vitamin D (CALCIUM-VITAMIN D3 PO) Take by mouth.   cetirizine (ZYRTEC) 10 MG tablet Take 1 tablet (10 mg total) by mouth daily as needed for allergies.   Cholecalciferol (VITAMIN D3 PO) Take 1 tablet by mouth daily.   ciprofloxacin-dexamethasone (CIPRODEX) OTIC suspension Place 4 drops into the left ear 2 (two) times daily. X4-7 days (Patient not taking: Reported on 06/24/2022)   fluticasone (FLONASE) 50 MCG/ACT nasal spray Place 2 sprays into both nostrils daily. prn   Multiple Vitamin (MULTIVITAMIN) tablet Take 1 tablet by mouth daily.   Multiple Vitamins-Minerals (PRESERVISION AREDS 2 PO) Take by mouth.   neomycin-polymyxin-hydrocortisone (CORTISPORIN) OTIC solution Place 4 drops into the left  ear 3 (three) times daily. X4-7 days, ciprodex too expensive (Patient not taking: Reported on 06/24/2022)   Polyvinyl Alcohol-Povidone (REFRESH OP) Apply 1 drop to eye as needed.   rosuvastatin (CRESTOR) 5 MG tablet Take 1 tablet (5 mg total) by mouth daily.   Vibegron (GEMTESA) 75 MG TABS Take 75 mg by mouth daily.   Vibegron (GEMTESA) 75 MG TABS Take 75 mg by mouth daily.   No facility-administered encounter medications on file as of 07/01/2022.    Allergies (verified) Other, Adhesive [tape], Fd&c yellow #5 (tartrazine), and Yellow dyes (non-tartrazine)   History: Past Medical History:  Diagnosis Date   Arthritis    C. difficile diarrhea    age [id number]   Chicken pox    Cholecystitis 11/2011   Did not require sgy - Dr. SStaci Acosta- [facility]  (cholelithiasis)   H/O Clostridium difficile infection    IBS (irritable bowel syndrome)    MRSA exposure 2005   Spider bite   MVP (mitral valve prolapse)    Stable - Dr. FUbaldo Glassing  Rheumatic fever    Past Surgical History:  Procedure Laterality Date   CATARACT EXTRACTION  [id number]  MUSCLE BIOPSY     Family History  Problem Relation Age of Onset   Arthritis Mother    Stroke Mother    Diabetes Mother    Hypertension Mother    Arthritis  Father    Stroke Father    Diabetes Paternal Grandmother    Cancer Paternal Uncle        colon   Heart disease Other        maternal and paternal side   Breast cancer Paternal 62    Breast cancer Cousin    Breast cancer Cousin        female cousin   Breast cancer Cousin    Social History   Socioeconomic History   Marital status: Widowed    Spouse name: Not on file   Number of children: Not on file   Years of education: 16   Highest education level: Not on file  Occupational History   Occupation: Retired  Tobacco Use   Smoking status: Never    Passive exposure: Never   Smokeless tobacco: Never  Vaping Use   Vaping Use: Never used  Substance and Sexual Activity   Alcohol use: Not Currently     Comment: Rarely   Drug use: No   Sexual activity: Yes    Partners: Male    Birth control/protection: Post-menopausal  Other Topics Concern   Not on file  Social History Narrative   Widowed    2 kids son and daughter   Social Determinants of Health   Financial Resource Strain: Low Risk  (07/01/2022)   Overall Financial Resource Strain (CARDIA)    Difficulty of Paying Living Expenses: Not hard at all  Food Insecurity: No Food Insecurity (07/01/2022)   Hunger Vital Sign    Worried About Running Out of Food in the Last Year: Never true    [place] in the Last Year: Never true  Transportation Needs: No Transportation Needs (07/01/2022)   PRAPARE - Hydrologist (Medical): No    Lack of Transportation (Non-Medical): No  Physical Activity: Insufficiently Active (06/27/2021)   Exercise Vital Sign    Days of Exercise per Week: 2 days    Minutes of Exercise per Session: 50 min  Stress: No Stress Concern Present (07/01/2022)   [place]    Feeling of Stress : Not at all  Social Connections: Moderately Integrated (07/01/2022)   Social Connection and Isolation Panel [NHANES]    Frequency of Communication with Friends and Family: More than three times a week    Frequency of Social Gatherings with Friends and Family: More than three times a week    Attends Religious Services: More than 4 times per year    Active Member of [employer] or Organizations: Yes    Attends Archivist Meetings: More than 4 times per year    Marital Status: Widowed    Tobacco Counseling Counseling given: Not Answered   Clinical Intake:  Pre-visit preparation completed: Yes        Diabetes: No  How often do you need to have someone help you when you read instructions, pamphlets, or other written materials from your doctor or pharmacy?: 1 - Never    Interpreter Needed?: No      Activities of  Daily Living    07/01/2022    1:45 PM  In your present state of health, do you have any difficulty performing the following activities:  Hearing? 1  Comment Has hearing aids but does not wear them often  Vision? 0  Difficulty concentrating or making decisions? 0  Comment Paces self with activity  Walking or climbing stairs? 0  Dressing or  bathing? 0  Doing errands, shopping? 0  Preparing Food and eating ? N  Using the Toilet? N  In the past six months, have you accidently leaked urine? Y  Comment Managed with daily depend  Do you have problems with loss of bowel control? N  Managing your Medications? N  Managing your Finances? N  Housekeeping or managing your Housekeeping? N    Patient Care Team: Einar Pheasant, MD as PCP - General (Internal Medicine)  Indicate any recent Medical Services you may have received from other than [facility] providers in the past year (date may be approximate).     Assessment:   This is a routine wellness examination for [place].  I connected with  Lynn Mann on 07/01/22 by a audio enabled telemedicine application and verified that I am speaking with the correct person using two identifiers.  Patient Location: Home  Provider Location: Office/Clinic  I discussed the limitations of evaluation and management by telemedicine. The patient expressed understanding and agreed to proceed.   Hearing/Vision screen Hearing Screening - Comments:: Hearing aids Does not wear them often   Vision Screening - Comments:: Followed by Dr. Ellin Mayhew Wears corrective lenses  Cataract extraction, bilateral  They have seen their ophthalmologist in the last 12 months.   Dietary issues and exercise activities discussed: Current Exercise Habits: Home exercise routine, Intensity: Mild Low cholesterol diet Exploring plant based eating  Choosing foods higher in estrogen  Good water intake   Goals Addressed               This Visit's Progress     Patient  Stated     Weight goal 170lb (pt-stated)        Increase physical activity with water walking Lose 10lb       Depression Screen    07/01/2022    1:52 PM 05/08/2022    3:08 PM 02/05/2022    1:26 PM 12/26/2021    1:25 PM 12/06/2021    1:48 PM 06/27/2021    2:23 PM 06/06/2021   11:44 AM  PHQ 2/9 Scores  PHQ - 2 Score 0 0 0 0 0 0 0    Fall Risk    07/01/2022    1:47 PM 05/08/2022    3:07 PM 02/05/2022    1:26 PM 12/26/2021    1:25 PM 12/06/2021    1:47 PM  [place] in the past year? 1 1 0 1 1  Number falls in past yr: 0 0 0 1 1  Injury with Fall?  1 0 1 1  Risk for fall due to :  Other (Comment) Impaired balance/gait;Impaired mobility;Impaired vision Impaired balance/gait;Impaired mobility;History of fall(s) History of fall(s);Impaired balance/gait  Follow up Falls evaluation completed Falls evaluation completed Falls evaluation completed Falls evaluation completed Falls evaluation completed    [place]: Home free of loose throw rugs in walkways, pet beds, electrical cords, etc? Yes  Adequate lighting in your home to reduce risk of falls? Yes   ASSISTIVE DEVICES UTILIZED TO PREVENT FALLS: Life alert? No  Use of a cane, walker or w/c? No  Grab bars in the bathroom? No  Shower chair or bench in shower? No  Elevated toilet seat or a handicapped toilet? No   TIMED UP AND GO: Was the test performed? No .   Cognitive Function:        07/01/2022    1:51 PM  6CIT Screen  What Year? 0 points  What month? 0 points  What time? 0 points  Count back from 20 0 points  Months in reverse 0 points  Repeat phrase 0 points  Total Score 0 points    Immunizations Immunization History  Administered Date(s) Administered   Moderna Sars-Covid-2 Vaccination 10/28/2019, 11/18/2019   Tdap 07/25/2015   Screening Tests Health Maintenance  Topic Date Due   COVID-19 Vaccine (3 - 2023-24 season) 07/17/2022 (Originally 03/29/2022)   Zoster  Vaccines- Shingrix (1 of 2) 09/13/2022 (Originally 09/12/2002)   Pneumonia Vaccine 62+ Years old (1 - PCV) 06/14/2023 (Originally 09/12/2017)   COLONOSCOPY (Pts 45-44yr Insurance coverage will need to be confirmed)  05/06/2023   Medicare Annual Wellness (AWV)  07/02/2023   MAMMOGRAM  06/12/2024   DTaP/Tdap/Td (2 - Td or Tdap) 07/24/2025   DEXA SCAN  Completed   Hepatitis C Screening  Completed   HPV VACCINES  Aged Out   INFLUENZA VACCINE  Discontinued   Health Maintenance There are no preventive care reminders to display for this patient.  Bone density- agrees to schedule.   Lung Cancer Screening: (Low Dose CT Chest recommended if Age 69-80years, 30 pack-year currently smoking OR have quit w/in 15years.) does not qualify.   Hepatitis C Screening: Completed 2001.   Vision Screening: Recommended annual ophthalmology exams for early detection of glaucoma and other disorders of the eye.  Dental Screening: Recommended annual dental exams for proper oral hygiene  Community Resource Referral / Chronic Care Management: CRR required this visit?  No   CCM required this visit?  No      Plan:     I have personally reviewed and noted the following in the patient's chart:   Medical and social history Use of alcohol, tobacco or illicit drugs  Current medications and supplements including opioid prescriptions. Patient is not currently taking opioid prescriptions. Functional ability and status Nutritional status Physical activity Advanced directives List of other physicians Hospitalizations, surgeries, and ER visits in previous 12 months Vitals Screenings to include cognitive, depression, and falls Referrals and appointments  In addition, I have reviewed and discussed with patient certain preventive protocols, quality metrics, and best practice recommendations. A written personalized care plan for preventive services as well as general preventive health recommendations were provided  to patient.     DLeta Jungling LPN   101/0/0712

## 2022-07-01 NOTE — Patient Instructions (Addendum)
Lynn Mann , Thank you for taking time to come for your Medicare Wellness Visit. I appreciate your ongoing commitment to your health goals. Please review the following plan we discussed and let me know if I can assist you in the future.   These are the goals we discussed:  Goals       Patient Stated     Weight goal 170lb (pt-stated)      Increase physical activity with water walking Lose 10lb      Other     Follow up with Primary Care Provider      As needed        This is a list of the screening recommended for you and due dates:  Health Maintenance  Topic Date Due   COVID-19 Vaccine (3 - 2023-24 season) 07/17/2022*   Zoster (Shingles) Vaccine (1 of 2) 09/13/2022*   Pneumonia Vaccine (1 - PCV) 06/14/2023*   Colon Cancer Screening  05/06/2023   Medicare Annual Wellness Visit  07/02/2023   Mammogram  06/12/2024   DTaP/Tdap/Td vaccine (2 - Td or Tdap) 07/24/2025   DEXA scan (bone density measurement)  Completed   Hepatitis C Screening: USPSTF Recommendation to screen - Ages 1-79 yo.  Completed   HPV Vaccine  Aged Out   Flu Shot  Discontinued  *Topic was postponed. The date shown is not the original due date.    Advanced directives: not yet completed  Conditions/risks identified: none new  Next appointment: Follow up in one year for your annual wellness visit    Preventive Care 65 Years and Older, Female Preventive care refers to lifestyle choices and visits with your health care provider that can promote health and wellness. What does preventive care include? A yearly physical exam. This is also called an annual well check. Dental exams once or twice a year. Routine eye exams. Ask your health care provider how often you should have your eyes checked. Personal lifestyle choices, including: Daily care of your teeth and gums. Regular physical activity. Eating a healthy diet. Avoiding tobacco and drug use. Limiting alcohol use. Practicing safe sex. Taking  low-dose aspirin every day. Taking vitamin and mineral supplements as recommended by your health care provider. What happens during an annual well check? The services and screenings done by your health care provider during your annual well check will depend on your age, overall health, lifestyle risk factors, and family history of disease. Counseling  Your health care provider may ask you questions about your: Alcohol use. Tobacco use. Drug use. Emotional well-being. Home and relationship well-being. Sexual activity. Eating habits. History of falls. Memory and ability to understand (cognition). Work and work Statistician. Reproductive health. Screening  You may have the following tests or measurements: Height, weight, and BMI. Blood pressure. Lipid and cholesterol levels. These may be checked every 5 years, or more frequently if you are over 87 years old. Skin check. Lung cancer screening. You may have this screening every year starting at age 21 if you have a 30-pack-year history of smoking and currently smoke or have quit within the past 15 years. Fecal occult blood test (FOBT) of the stool. You may have this test every year starting at age 21. Flexible sigmoidoscopy or colonoscopy. You may have a sigmoidoscopy every 5 years or a colonoscopy every 10 years starting at age 80. Hepatitis C blood test. Hepatitis B blood test. Sexually transmitted disease (STD) testing. Diabetes screening. This is done by checking your blood sugar (glucose) after you have  not eaten for a while (fasting). You may have this done every 1-3 years. Bone density scan. This is done to screen for osteoporosis. You may have this done starting at age 69. Mammogram. This may be done every 1-2 years. Talk to your health care provider about how often you should have regular mammograms. Talk with your health care provider about your test results, treatment options, and if necessary, the need for more tests. Vaccines   Your health care provider may recommend certain vaccines, such as: Influenza vaccine. This is recommended every year. Tetanus, diphtheria, and acellular pertussis (Tdap, Td) vaccine. You may need a Td booster every 10 years. Zoster vaccine. You may need this after age 29. Pneumococcal 13-valent conjugate (PCV13) vaccine. One dose is recommended after age 43. Pneumococcal polysaccharide (PPSV23) vaccine. One dose is recommended after age 19. Talk to your health care provider about which screenings and vaccines you need and how often you need them. This information is not intended to replace advice given to you by your health care provider. Make sure you discuss any questions you have with your health care provider. Document Released: 08/11/2015 Document Revised: 04/03/2016 Document Reviewed: 05/16/2015 Elsevier Interactive Patient Education  2017 [employer] Prevention in the Home Falls can cause injuries. They can happen to people of all ages. There are many things you can do to make your home safe and to help prevent falls. What can I do on the outside of my home? Regularly fix the edges of walkways and driveways and fix any cracks. Remove anything that might make you trip as you walk through a door, such as a raised step or threshold. Trim any bushes or trees on the path to your home. Use bright outdoor lighting. Clear any walking paths of anything that might make someone trip, such as rocks or tools. Regularly check to see if handrails are loose or broken. Make sure that both sides of any steps have handrails. Any raised decks and porches should have guardrails on the edges. Have any leaves, snow, or ice cleared regularly. Use sand or salt on walking paths during winter. Clean up any spills in your garage right away. This includes oil or grease spills. What can I do in the bathroom? Use night lights. Install grab bars by the toilet and in the tub and shower. Do not use towel  bars as grab bars. Use non-skid mats or decals in the tub or shower. If you need to sit down in the shower, use a plastic, non-slip stool. Keep the floor dry. Clean up any water that spills on the floor as soon as it happens. Remove soap buildup in the tub or shower regularly. Attach bath mats securely with double-sided non-slip rug tape. Do not have throw rugs and other things on the floor that can make you trip. What can I do in the bedroom? Use night lights. Make sure that you have a light by your bed that is easy to reach. Do not use any sheets or blankets that are too big for your bed. They should not hang down onto the floor. Have a firm chair that has side arms. You can use this for support while you get dressed. Do not have throw rugs and other things on the floor that can make you trip. What can I do in the kitchen? Clean up any spills right away. Avoid walking on wet floors. Keep items that you use a lot in easy-to-reach places. If you need to  reach something above you, use a strong step stool that has a grab bar. Keep electrical cords out of the way. Do not use floor polish or wax that makes floors slippery. If you must use wax, use non-skid floor wax. Do not have throw rugs and other things on the floor that can make you trip. What can I do with my stairs? Do not leave any items on the stairs. Make sure that there are handrails on both sides of the stairs and use them. Fix handrails that are broken or loose. Make sure that handrails are as long as the stairways. Check any carpeting to make sure that it is firmly attached to the stairs. Fix any carpet that is loose or worn. Avoid having throw rugs at the top or bottom of the stairs. If you do have throw rugs, attach them to the floor with carpet tape. Make sure that you have a light switch at the top of the stairs and the bottom of the stairs. If you do not have them, ask someone to add them for you. What else can I do to help  prevent falls? Wear shoes that: Do not have high heels. Have rubber bottoms. Are comfortable and fit you well. Are closed at the toe. Do not wear sandals. If you use a stepladder: Make sure that it is fully opened. Do not climb a closed stepladder. Make sure that both sides of the stepladder are locked into place. Ask someone to hold it for you, if possible. Clearly mark and make sure that you can see: Any grab bars or handrails. First and last steps. Where the edge of each step is. Use tools that help you move around (mobility aids) if they are needed. These include: Canes. Walkers. Scooters. Crutches. Turn on the lights when you go into a dark area. Replace any light bulbs as soon as they burn out. Set up your furniture so you have a clear path. Avoid moving your furniture around. If any of your floors are uneven, fix them. If there are any pets around you, be aware of where they are. Review your medicines with your doctor. Some medicines can make you feel dizzy. This can increase your chance of falling. Ask your doctor what other things that you can do to help prevent falls. This information is not intended to replace advice given to you by your health care provider. Make sure you discuss any questions you have with your health care provider. Document Released: 05/11/2009 Document Revised: 12/21/2015 Document Reviewed: 08/19/2014 Elsevier Interactive Patient Education  2017 [employer].

## 2022-07-15 NOTE — Assessment & Plan Note (Signed)
-   Patient has symptoms of snoring and nocturia.  Associated daytime fatigue.  Sleep study in 2019 reportedly negative.  Patient needs home sleep study to evaluate for underlying obstructive sleep apnea.  Discussed risks of untreated sleep apnea including cardiac arrhythmias, pulmonary hypertension, diabetes and stroke.  We also reviewed treatment options including weight loss, oral appliance, CPAP therapy or referral to ENT for possible surgical options.  Encouraged weight loss efforts and side sleeping position.  Advised against driving if experiencing excessive daytime sleepiness fatigue.  Follow-up 1 to 2 weeks after sleep study to review results and treatment options if needed.

## 2022-07-18 ENCOUNTER — Telehealth: Payer: PPO | Admitting: Family

## 2022-07-18 ENCOUNTER — Telehealth: Payer: Self-pay | Admitting: Internal Medicine

## 2022-07-18 NOTE — Telephone Encounter (Signed)
Pt called stating she has headache, sore throat, cough fever of 100.1. Pt took a at home Covid test and the line was faint pink. Pt had an appointment today but it was canceled because pt did not feel comfortable doing mychart plus her insurance would not cover it. Pt called back and stated her insurance will cover a phone visit but we do not have any appointments

## 2022-07-19 NOTE — Telephone Encounter (Signed)
LM for pt to cb re: NEEDS TO GO TO UC or can schedule virtual on mychart.

## 2022-07-20 DIAGNOSIS — Z03818 Encounter for observation for suspected exposure to other biological agents ruled out: Secondary | ICD-10-CM | POA: Diagnosis not present

## 2022-07-20 DIAGNOSIS — U071 COVID-19: Secondary | ICD-10-CM | POA: Diagnosis not present

## 2022-07-20 DIAGNOSIS — R350 Frequency of micturition: Secondary | ICD-10-CM | POA: Diagnosis not present

## 2022-07-20 DIAGNOSIS — R1031 Right lower quadrant pain: Secondary | ICD-10-CM | POA: Diagnosis not present

## 2022-07-20 DIAGNOSIS — J029 Acute pharyngitis, unspecified: Secondary | ICD-10-CM | POA: Diagnosis not present

## 2022-07-23 ENCOUNTER — Telehealth: Payer: Self-pay | Admitting: Internal Medicine

## 2022-07-23 NOTE — Telephone Encounter (Signed)
Access nurse called back advising the patient to go to the ED. The patient refused. I called the patient back and advised her that I spoke with the RN triage nurse and she advised her to go to the ED. I spoke with our RN PA and she stated she could go to the urgent care if she did not want to go to the ED. I informed the patient that the imaging that she may need we did not have in this office. The RN PA stated she may need a CAT Scan. I asked the patient if she was going to go to the ED or UC. She stated not today.

## 2022-07-23 NOTE — Telephone Encounter (Signed)
Sent to Print production planner . Patient stated she is still having abdominal pains on her right side and around her navel . Since being diagnosed with Covid 19.

## 2022-07-24 NOTE — Telephone Encounter (Signed)
Pt advised to go to UC or ED for persistent abdominal pain, weakness and diarrhea. Pt refuses to go to ED - agreeable to be seen at [facility]. Pt stated will go over this morning.

## 2022-07-25 ENCOUNTER — Encounter: Payer: Self-pay | Admitting: Emergency Medicine

## 2022-07-25 ENCOUNTER — Other Ambulatory Visit: Payer: Self-pay

## 2022-07-25 ENCOUNTER — Emergency Department
Admission: EM | Admit: 2022-07-25 | Discharge: 2022-07-25 | Disposition: A | Discharge status: Home / Self Care | Payer: PPO | Attending: Emergency Medicine | Admitting: Emergency Medicine

## 2022-07-25 ENCOUNTER — Emergency Department: Payer: PPO

## 2022-07-25 DIAGNOSIS — R1031 Right lower quadrant pain: Secondary | ICD-10-CM | POA: Diagnosis not present

## 2022-07-25 DIAGNOSIS — Z8616 Personal history of COVID-19: Secondary | ICD-10-CM | POA: Insufficient documentation

## 2022-07-25 DIAGNOSIS — R109 Unspecified abdominal pain: Secondary | ICD-10-CM | POA: Diagnosis not present

## 2022-07-25 DIAGNOSIS — R1084 Generalized abdominal pain: Secondary | ICD-10-CM

## 2022-07-25 LAB — COMPREHENSIVE METABOLIC PANEL
ALT: 17 U/L (ref 0–44)
AST: 21 U/L (ref 15–41)
Albumin: 3.9 g/dL (ref 3.5–5.0)
Alkaline Phosphatase: 61 U/L (ref 38–126)
Anion gap: 7 (ref 5–15)
BUN: 9 mg/dL (ref 8–23)
CO2: 26 mmol/L (ref 22–32)
Calcium: 9 mg/dL (ref 8.9–10.3)
Chloride: 110 mmol/L (ref 98–111)
Creatinine, Ser: 0.7 mg/dL (ref 0.44–1.00)
GFR, Estimated: >60 mL/min (ref >60)
Glucose, Bld: 116 mg/dL — ABNORMAL HIGH (ref 70–99)
Potassium: 3.7 mmol/L (ref 3.5–5.1)
Sodium: 143 mmol/L (ref 135–145)
Total Bilirubin: 1.2 mg/dL (ref 0.3–1.2)
Total Protein: 6.9 g/dL (ref 6.5–8.1)

## 2022-07-25 LAB — CBC
HCT: 42.1 % (ref 36.0–46.0)
Hemoglobin: 13.6 g/dL (ref 12.0–15.0)
MCH: 29 pg (ref 26.0–34.0)
MCHC: 32.3 g/dL (ref 30.0–36.0)
MCV: 89.8 fL (ref 80.0–100.0)
Platelets: 250 10*3/uL (ref 150–400)
RBC: 4.69 MIL/uL (ref 3.87–5.11)
RDW: 12.3 % (ref 11.5–15.5)
WBC: 6.2 10*3/uL (ref 4.0–10.5)
nRBC: 0 % (ref 0.0–0.2)

## 2022-07-25 LAB — URINALYSIS, ROUTINE W REFLEX MICROSCOPIC
Bilirubin Urine: NEGATIVE
Glucose, UA: NEGATIVE mg/dL
Hgb urine dipstick: NEGATIVE
Ketones, ur: 5 mg/dL — AB
Leukocytes,Ua: NEGATIVE
Nitrite: NEGATIVE
Protein, ur: NEGATIVE mg/dL
Specific Gravity, Urine: 1.018 (ref 1.005–1.030)
pH: 5 (ref 5.0–8.0)

## 2022-07-25 LAB — LIPASE, BLOOD: Lipase: 50 U/L (ref 11–51)

## 2022-07-25 MED ORDER — DICYCLOMINE HCL 10 MG PO CAPS: 10.0000 mg | ORAL_CAPSULE | Freq: Three times a day (TID) | ORAL | 0 refills | Status: DC | PRN

## 2022-07-25 NOTE — ED Provider Notes (Signed)
[facility] Provider Note    Event Date/Time   First MD Initiated Contact with Patient 07/25/22 1508     (approximate)  History   Chief Complaint: Abdominal Pain  HPI  Lynn Mann is a 71 y.o. female with a past medical history of arthritis, recent COVID infection 10 days ago who presents to the emergency department for abdominal pain.  According to the patient she was diagnosed with COVID a little over 10 days ago.  States fever cough and congestion have largely resolved however over the past 5 days she has developed abdominal pain mostly in the right lower quadrant.  Patient has been seen at urgent care twice for this and referred to the emergency department today given continued pain in the location of the right lower quadrant.  No recent fever, no recent vomiting patient does continues to state diarrhea but none today as of yet.  No urinary symptoms.  Physical Exam   Triage Vital Signs: ED Triage Vitals  Enc Vitals Group     BP 07/25/22 0914 119/68     Pulse Rate 07/25/22 0914 78     Resp 07/25/22 0914 16     Temp 07/25/22 0914 98.4 F (36.9 C)     Temp Source 07/25/22 0914 Oral     SpO2 07/25/22 0914 96 %     Weight 07/25/22 0912 179 lb 14.3 oz (81.6 kg)     Height 07/25/22 0912 '5\' 7"'$  (1.702 m)     Head Circumference --      Peak Flow --      Pain Score 07/25/22 0912 6     Pain Loc --      Pain Edu? --      Excl. in [place]? --     Most recent vital signs: Vitals:   07/25/22 0914 07/25/22 1413  BP: 119/68 120/70  Pulse: 78 70  Resp: 16 16  Temp: 98.4 F (36.9 C) 98 F (36.7 C)  SpO2: 96% 96%    General: Awake, no distress.  CV:  Good peripheral perfusion.  Regular rate and rhythm  Resp:  Normal effort.  Equal breath sounds bilaterally.  Abd:  No distention.  Soft, nontender.  No rebound or guarding.   ED Results / Procedures / Treatments   RADIOLOGY  I have reviewed and interpreted the CT images I do not see any obvious  obstruction or significant abnormality on my evaluation. CT scan of the abdomen and pelvis is essentially read as negative.   MEDICATIONS ORDERED IN ED: Medications - No data to display   IMPRESSION / MDM / [place] / ED COURSE  I reviewed the triage vital signs and the nursing notes.  Patient's presentation is most consistent with acute presentation with potential threat to life or bodily function.  Patient presents emergency department for abdominal discomfort the right lower quadrant for the past 5 days.  Patient continues to state mild to moderate discomfort mostly across the lower abdomen mostly in the right lower quadrant.  Continues to have intermittent diarrhea.  No vomiting.  No fever.  No urinary symptoms.  Patient's workup today is reassuring thus far with a normal CMP including normal LFTs and renal function, normal CBC with a normal white blood cell count and a normal lipase and reassuring urinalysis.  Given the patient's symptoms and location of pain she does have mild tenderness to this area as well we will proceed with CT imaging of abdomen/pelvis to further  evaluate.  Differential would include enteritis, mesenteric adenitis, appendicitis.  CT scan has resulted essentially normal.  Highly suspect more intestinal pain due to the patient's recent diarrheal illness and COVID infection.  We will place the patient on Bentyl to be used for intestinal spasms which should help with her intermittent discomfort.  Patient to follow-up with her doctor.  Patient agreeable to plan of care.  FINAL CLINICAL IMPRESSION(S) / ED DIAGNOSES   Right lower quadrant abdominal pain  Rx / DC Orders   Bentyl  Note:  This document was prepared using Dragon voice recognition software and may include unintentional dictation errors.   Harvest Dark, MD 07/25/22 [phone]

## 2022-07-25 NOTE — ED Triage Notes (Signed)
C?O lower abdominal pain since 12/23.  Referred to ED from [place].  AAOx3.  Skin warm and dry. NAD

## 2022-08-02 ENCOUNTER — Telehealth: Payer: Self-pay

## 2022-08-02 ENCOUNTER — Other Ambulatory Visit: Payer: Self-pay

## 2022-08-02 ENCOUNTER — Ambulatory Visit: Payer: PPO

## 2022-08-02 ENCOUNTER — Emergency Department: Payer: PPO

## 2022-08-02 DIAGNOSIS — Z8616 Personal history of COVID-19: Secondary | ICD-10-CM | POA: Diagnosis not present

## 2022-08-02 DIAGNOSIS — R1011 Right upper quadrant pain: Secondary | ICD-10-CM | POA: Diagnosis not present

## 2022-08-02 DIAGNOSIS — K8020 Calculus of gallbladder without cholecystitis without obstruction: Secondary | ICD-10-CM | POA: Insufficient documentation

## 2022-08-02 DIAGNOSIS — R932 Abnormal findings on diagnostic imaging of liver and biliary tract: Secondary | ICD-10-CM | POA: Diagnosis not present

## 2022-08-02 DIAGNOSIS — R0789 Other chest pain: Secondary | ICD-10-CM | POA: Diagnosis not present

## 2022-08-02 DIAGNOSIS — R079 Chest pain, unspecified: Secondary | ICD-10-CM

## 2022-08-02 DIAGNOSIS — K760 Fatty (change of) liver, not elsewhere classified: Secondary | ICD-10-CM | POA: Diagnosis not present

## 2022-08-02 LAB — CBC
HCT: 44.8 % (ref 36.0–46.0)
Hemoglobin: 14.3 g/dL (ref 12.0–15.0)
MCH: 29.2 pg (ref 26.0–34.0)
MCHC: 31.9 g/dL (ref 30.0–36.0)
MCV: 91.4 fL (ref 80.0–100.0)
Platelets: 299 10*3/uL (ref 150–400)
RBC: 4.9 MIL/uL (ref 3.87–5.11)
RDW: 12.5 % (ref 11.5–15.5)
WBC: 6.5 10*3/uL (ref 4.0–10.5)
nRBC: 0 % (ref 0.0–0.2)

## 2022-08-02 LAB — BASIC METABOLIC PANEL
Anion gap: 13 (ref 5–15)
BUN: 8 mg/dL (ref 8–23)
CO2: 23 mmol/L (ref 22–32)
Calcium: 9.4 mg/dL (ref 8.9–10.3)
Chloride: 105 mmol/L (ref 98–111)
Creatinine, Ser: 0.67 mg/dL (ref 0.44–1.00)
GFR, Estimated: >60 mL/min (ref >60)
Glucose, Bld: 82 mg/dL (ref 70–99)
Potassium: 5.4 mmol/L — ABNORMAL HIGH (ref 3.5–5.1)
Sodium: 141 mmol/L (ref 135–145)

## 2022-08-02 LAB — LIPASE, BLOOD: Lipase: 59 U/L — ABNORMAL HIGH (ref 11–51)

## 2022-08-02 LAB — TROPONIN I (HIGH SENSITIVITY): Troponin I (High Sensitivity): 2 ng/L (ref <18)

## 2022-08-02 NOTE — Telephone Encounter (Signed)
Patient in parking lot complaint of intermittent chest pain for the last 48 hours center chest radiating to back at times and to left side underarm, has HX of COVID during christmas and enteritis with covid. Patient  had been feeling better was concerned with weekend coming having chest pain so came to office called inside stating she was in parking  lot with chest pain wanting an EKG. I an another nurse responded to patient call attained BP 130/48 pulse 104 oxygen at 98% on room air and EKG patient pain eased once in building , advised PCP of symptoms and history and EKG advice was patient needed to ER because she need through workup to completely rule out heart and complications from Covid.

## 2022-08-02 NOTE — ED Triage Notes (Signed)
Pt to ED via [place] from [place]. Pt reports abdominal pain and has been on a liquid diet. Pt reports she ate boiled eggs today and had epigastric pain and back pain. Pt reports pain has subsided.

## 2022-08-02 NOTE — Telephone Encounter (Signed)
Patient states she is now in our parking lot.  Patient states she is primarily on a liquid diet. Patient states when she ate a boiled egg, she experienced strong pain in chest under breast bone. Earlier today she had chest pain and a little quiver chill. Tiny pain in middle of back just to the right of the left shoulder blade.  Pain across her chest under ribs.  I offered to let her speak with our triage nurse and patient declined saying that it will take too long and she will not go to the ED or Urgent Care.  Patient states she would like to come in and have Dr. Einar Pheasant run an EKG.  Patient states if she can't be seen here, then she will just go home.  Patient states she is pulling up in our parking lot now.   I spoke with our triage nurse on-site and medical personnel went to parking lot to assist patient.

## 2022-08-02 NOTE — Telephone Encounter (Signed)
Lynn Mann has the Patient in a room checking her.

## 2022-08-02 NOTE — Telephone Encounter (Signed)
Reviewed message.  Pt reported intermittent chest pain.  No pain - in office - prior to being transported over to the ER.  EKG - SR with no acute ischemic changes.  Pain - chest to back.  Recent covid infection.  Recent - per documentation - enteritis.  Agree with need for ER evaluation - to see if can confirm etiology - question of cardiac etiology and question of need for further cardiac evaluation, CT scan, etc.  Pt daughter - agreed to transport Lynn Mann to ER.

## 2022-08-03 ENCOUNTER — Emergency Department
Admission: EM | Admit: 2022-08-03 | Discharge: 2022-08-03 | Disposition: A | Discharge status: Home / Self Care | Payer: PPO | Attending: Emergency Medicine | Admitting: Emergency Medicine

## 2022-08-03 ENCOUNTER — Emergency Department: Payer: PPO

## 2022-08-03 DIAGNOSIS — R932 Abnormal findings on diagnostic imaging of liver and biliary tract: Secondary | ICD-10-CM | POA: Diagnosis not present

## 2022-08-03 DIAGNOSIS — K8020 Calculus of gallbladder without cholecystitis without obstruction: Secondary | ICD-10-CM | POA: Diagnosis not present

## 2022-08-03 DIAGNOSIS — R079 Chest pain, unspecified: Secondary | ICD-10-CM

## 2022-08-03 DIAGNOSIS — K760 Fatty (change of) liver, not elsewhere classified: Secondary | ICD-10-CM | POA: Diagnosis not present

## 2022-08-03 DIAGNOSIS — R1011 Right upper quadrant pain: Secondary | ICD-10-CM | POA: Diagnosis not present

## 2022-08-03 LAB — HEPATIC FUNCTION PANEL
ALT: 11 U/L (ref 0–44)
AST: 18 U/L (ref 15–41)
Albumin: 3.6 g/dL (ref 3.5–5.0)
Alkaline Phosphatase: 62 U/L (ref 38–126)
Bilirubin, Direct: 0.2 mg/dL (ref 0.0–0.2)
Indirect Bilirubin: 0.9 mg/dL (ref 0.3–0.9)
Total Bilirubin: 1.1 mg/dL (ref 0.3–1.2)
Total Protein: 6.5 g/dL (ref 6.5–8.1)

## 2022-08-03 LAB — POTASSIUM: Potassium: 4.2 mmol/L (ref 3.5–5.1)

## 2022-08-03 LAB — TROPONIN I (HIGH SENSITIVITY): Troponin I (High Sensitivity): 3 ng/L (ref <18)

## 2022-08-03 MED ORDER — ONDANSETRON 4 MG PO TBDP: 4.0000 mg | ORAL_TABLET | Freq: Four times a day (QID) | ORAL | 0 refills | Status: DC | PRN

## 2022-08-03 MED ORDER — HYDROCODONE-ACETAMINOPHEN 5-325 MG PO TABS: 1.0000 | ORAL_TABLET | Freq: Four times a day (QID) | ORAL | 0 refills | Status: DC | PRN

## 2022-08-03 NOTE — ED Provider Notes (Signed)
[facility] Provider Note    Event Date/Time   First MD Initiated Contact with Patient 08/03/22 [phone]     (approximate)   History   Chest Pain   HPI  Lynn Mann is a 72 y.o. female with history of cholelithiasis, mitral valve prolapse who presents emergency department with upper abdominal pain and lower chest pain that started today that worsened after eating a hard-boiled egg.  Pain radiates into the back.  She has had some nausea but no vomiting.  Recently had a GI illness and COVID and those symptoms have resolved.  No previous abdominal surgery.  No difficulty breathing.  No fever.   History provided by patient.    Past Medical History:  Diagnosis Date   Arthritis    C. difficile diarrhea    age 32s-40s    Chicken pox    Cholecystitis 11/2011   Did not require sgy - Dr. Staci Acosta - [facility]  (cholelithiasis)   H/O Clostridium difficile infection    IBS (irritable bowel syndrome)    MRSA exposure 2005   Spider bite   MVP (mitral valve prolapse)    Stable - Dr. Ubaldo Glassing   Rheumatic fever     Past Surgical History:  Procedure Laterality Date   CATARACT EXTRACTION  [id number]   MUSCLE BIOPSY      MEDICATIONS:  Prior to Admission medications   Medication Sig Start Date End Date Taking? Authorizing Provider  albuterol (VENTOLIN HFA) 108 (90 Base) MCG/ACT inhaler Inhale 2 puffs into the lungs every 6 (six) hours as needed. 03/11/22   [provider]  aspirin 325 MG tablet Take 325 mg by mouth. Takes it occasionally. Has not taken lately    [provider]  Calcium Carbonate-Vitamin D (CALCIUM-VITAMIN D3 PO) Take by mouth.    [provider]  cetirizine (ZYRTEC) 10 MG tablet Take 1 tablet (10 mg total) by mouth daily as needed for allergies. 05/08/22   McLean-Scocuzza, Nino Glow, MD  Cholecalciferol (VITAMIN D3 PO) Take 1 tablet by mouth daily.    [provider]  ciprofloxacin-dexamethasone (CIPRODEX) OTIC  suspension Place 4 drops into the left ear 2 (two) times daily. X4-7 days Patient not taking: Reported on 06/24/2022 05/08/22   McLean-Scocuzza, Nino Glow, MD  dicyclomine (BENTYL) 10 MG capsule Take 1 capsule (10 mg total) by mouth 3 (three) times daily as needed for spasms. 07/25/22   Harvest Dark, MD  fluticasone (FLONASE) 50 MCG/ACT nasal spray Place 2 sprays into both nostrils daily. prn 10/25/21   McLean-Scocuzza, Nino Glow, MD  Multiple Vitamin (MULTIVITAMIN) tablet Take 1 tablet by mouth daily.    [provider]  Multiple Vitamins-Minerals (PRESERVISION AREDS 2 PO) Take by mouth.    [provider]  neomycin-polymyxin-hydrocortisone (CORTISPORIN) OTIC solution Place 4 drops into the left ear 3 (three) times daily. X4-7 days, ciprodex too expensive Patient not taking: Reported on 06/24/2022 05/09/22   McLean-Scocuzza, Nino Glow, MD  Polyvinyl Alcohol-Povidone (REFRESH OP) Apply 1 drop to eye as needed.    [provider]  rosuvastatin (CRESTOR) 5 MG tablet Take 1 tablet (5 mg total) by mouth daily. 07/01/22   Einar Pheasant, MD  Vibegron (GEMTESA) 75 MG TABS Take 75 mg by mouth daily. 06/17/22   MacDiarmid, Nicki Reaper, MD  Vibegron (GEMTESA) 75 MG TABS Take 75 mg by mouth daily. 06/17/22   Bjorn Loser, MD    Physical Exam   Triage Vital Signs: ED Triage Vitals  Enc Vitals  Group     BP 08/02/22 1809 133/71     Pulse Rate 08/02/22 1805 89     Resp 08/02/22 1805 18     Temp 08/02/22 1805 98.3 F (36.8 C)     Temp Source 08/02/22 1805 Oral     SpO2 08/02/22 1805 100 %     Weight --      Height --      Head Circumference --      Peak Flow --      Pain Score 08/02/22 1805 5     Pain Loc --      Pain Edu? --      Excl. in [place]? --     Most recent vital signs: Vitals:   08/02/22 1809 08/03/22 0033  BP: 133/71 121/87  Pulse:  (!) 102  Resp:  16  Temp:  98.3 F (36.8 C)  SpO2:  96%    CONSTITUTIONAL: Alert and oriented and responds appropriately  to questions. Well-appearing; well-nourished HEAD: Normocephalic, atraumatic EYES: Conjunctivae clear, pupils appear equal, sclera nonicteric ENT: normal nose; moist mucous membranes NECK: Supple, normal ROM CARD: RRR; S1 and S2 appreciated; no murmurs, no clicks, no rubs, no gallops RESP: Normal chest excursion without splinting or tachypnea; breath sounds clear and equal bilaterally; no wheezes, no rhonchi, no rales, no hypoxia or respiratory distress, speaking full sentences ABD/GI: Normal bowel sounds; non-distended; soft, lightly tender throughout the upper abdomen without guarding or rebound, negative Murphy sign BACK: The back appears normal EXT: Normal ROM in all joints; no deformity noted, no edema; no cyanosis SKIN: Normal color for age and race; warm; no rash on exposed skin NEURO: Moves all extremities equally, normal speech PSYCH: The patient's mood and manner are appropriate.   ED Results / Procedures / Treatments   LABS: (all labs ordered are listed, but only abnormal results are displayed) Labs Reviewed  BASIC METABOLIC PANEL - Abnormal; Notable for the following components:      Result Value   Potassium 5.4 (*)    All other components within normal limits  LIPASE, BLOOD - Abnormal; Notable for the following components:   Lipase 59 (*)    All other components within normal limits  CBC  HEPATIC FUNCTION PANEL  POTASSIUM  TROPONIN I (HIGH SENSITIVITY)  TROPONIN I (HIGH SENSITIVITY)     EKG:  EKG Interpretation  Date/Time:  Friday August 02 2022 18:13:51 EST Ventricular Rate:  85 PR Interval:  136 QRS Duration: 76 QT Interval:  336 QTC Calculation: 399 R Axis:   51 Text Interpretation: Normal sinus rhythm Cannot rule out Anterior infarct , age undetermined ST & T wave abnormality, consider inferior ischemia Abnormal ECG When compared with ECG of 31-Jan-2021 19:17, ST now depressed in Inferior leads T wave inversion now evident in Inferior leads Nonspecific T  wave abnormality now evident in Anterolateral leads Confirmed by Pryor Curia [phone]) on 08/03/2022 12:55:34 AM        EKG Interpretation  Date/Time:  Saturday August 03 2022 02:14:32 EST Ventricular Rate:  71 PR Interval:  128 QRS Duration: 74 QT Interval:  400 QTC Calculation: 434 R Axis:   51 Text Interpretation: Normal sinus rhythm Low voltage QRS Cannot rule out Anterior infarct (cited on or before 02-Aug-2022) Abnormal ECG When compared with ECG of 02-Aug-2022 18:13, T wave inversion no longer evident in Inferior leads Nonspecific T wave abnormality no longer evident in Lateral leads Confirmed by Silver Achey, Cyril Mourning [phone]) on 08/03/2022 2:27:21 AM  RADIOLOGY: My personal review and interpretation of imaging: ReSound shows gallstones without cystitis.  Chest x-ray clear. I have personally reviewed all radiology reports.   US ABDOMEN LIMITED RUQ (LIVER/GB)  Result Date: 08/03/2022 CLINICAL DATA:  Right upper quadrant pain. EXAM: ULTRASOUND ABDOMEN LIMITED RIGHT UPPER QUADRANT COMPARISON:  None Available. FINDINGS: Gallbladder: Multiple shadowing, echogenic gallstones are seen within the gallbladder lumen. The largest measures approximately 1.2 cm. There is no evidence of gallbladder wall thickening (2.0 mm). No sonographic Murphy sign noted by sonographer. Common bile duct: Diameter: 5.6 mm Liver: No focal lesion identified. Diffusely increased echogenicity of the liver parenchyma is noted. Portal vein is patent on color Doppler imaging with normal direction of blood flow towards the liver. Other: None. IMPRESSION: 1. Cholelithiasis. 2. Hepatic steatosis without focal liver lesions. Electronically Signed   By: Virgina Norfolk M.D.   On: 08/03/2022 02:07   DG Chest 2 View  Result Date: 08/02/2022 CLINICAL DATA:  Chest pain EXAM: CHEST - 2 VIEW COMPARISON:  Chest x-ray September 26, 2021 FINDINGS: The cardiomediastinal silhouette is unchanged in contour. No focal pulmonary opacity. No  pleural effusion or pneumothorax. The visualized upper abdomen is unremarkable. No acute osseous abnormality. IMPRESSION: No acute cardiopulmonary abnormality. Electronically Signed   By: Beryle Flock M.D.   On: 08/02/2022 18:50     PROCEDURES:  Critical Care performed: No     Procedures    IMPRESSION / MDM / ASSESSMENT AND PLAN / ED COURSE  I reviewed the triage vital signs and the nursing notes.    Patient here with chest pain and upper abdominal pain.    DIFFERENTIAL DIAGNOSIS (includes but not limited to):   ACS, less likely PE or dissection.  Differential also includes cholelithiasis, cholecystitis, pancreatitis, gastritis, GERD, esophagitis, esophageal spasm.   Patient's presentation is most consistent with acute presentation with potential threat to life or bodily function.   PLAN: Patient's labs show no leukocytosis, normal hemoglobin.  Potassium slightly elevated at 5.4 but normal creatinine.  Will recheck.  EKG shows no changes.  First troponin negative.  Second pending.  He is minimally elevated.  LFTs pending.  Chest x-ray reviewed and interpreted by myself and radiologist and shows no acute abnormality.  Will obtain right upper quadrant ultrasound.  Patient declines anything for pain or nausea at this time.   MEDICATIONS GIVEN IN ED: Medications - No data to display   ED COURSE: Second troponin negative.  Repeat potassium normal.  Normal LFTs.  Ultrasound reviewed and interpreted by myself and the radiologist and shows cholelithiasis without choledocholithiasis or cholecystitis.  Will discharge home with outpatient surgery follow-up, information for low-fat diet.  Discussed return precautions.  Patient declines any pain medication for home but agrees with antiemetics.  Will discharge with Zofran.   At this time, I do not feel there is any life-threatening condition present. I reviewed all nursing notes, vitals, pertinent previous records.  All lab and urine  results, EKGs, imaging ordered have been independently reviewed and interpreted by myself.  I reviewed all available radiology reports from any imaging ordered this visit.  Based on my assessment, I feel the patient is safe to be discharged home without further emergent workup and can continue workup as an outpatient as needed. Discussed all findings, treatment plan as well as usual and customary return precautions.  They verbalize understanding and are comfortable with this plan.  Outpatient follow-up has been provided as needed.  All questions have been answered.    CONSULTS:  none   OUTSIDE RECORDS REVIEWED: Reviewed patient's last PCP note on 07/20/2022 and 07/25/2022.       FINAL CLINICAL IMPRESSION(S) / ED DIAGNOSES   Final diagnoses:  Nonspecific chest pain  Gallstones     Rx / DC Orders   ED Discharge Orders          Ordered    ondansetron (ZOFRAN-ODT) 4 MG disintegrating tablet  Every 6 hours PRN        08/03/22 0251             Note:  This document was prepared using Dragon voice recognition software and may include unintentional dictation errors.   Nykira Reddix, Delice Bison, DO 08/03/22 [phone]

## 2022-08-03 NOTE — ED Notes (Signed)
Additional prescription sent to same pharmacy. Pt verbalizes understanding on using.   Denies questions and verbalizes understanding of discharge teaching.

## 2022-08-07 ENCOUNTER — Telehealth: Payer: Self-pay | Admitting: Urology

## 2022-08-07 ENCOUNTER — Telehealth: Payer: Self-pay | Admitting: Primary Care

## 2022-08-07 DIAGNOSIS — R0683 Snoring: Secondary | ICD-10-CM

## 2022-08-07 NOTE — Telephone Encounter (Signed)
I have filled out the form and faxed records to [facility] to see if they can get the In Lab Sleep Study approved

## 2022-08-07 NOTE — Telephone Encounter (Signed)
We certainly can order in-lab and see if insurance will cover it but there a strong possibility they may not since that previously sleep study was 5 years ago. I have placed order

## 2022-08-07 NOTE — Telephone Encounter (Signed)
Pt would like for MacDiarmid to look at some tests she had done at the hospital before r/s her cysto.  She called today to cancel cysto.

## 2022-08-07 NOTE — Telephone Encounter (Signed)
I spoke with Mrs. Cavan today about scheduling picking up the HST machine.  Mrs. Fiebelkorn really thinks that if she is going to repeat sleep study it should be in lab sleep study.  I told Mrs. Klahr that we could try but her insurance may not approve in lab sleep study

## 2022-08-08 ENCOUNTER — Ambulatory Visit: Payer: PPO | Attending: Internal Medicine | Admitting: Physical Therapy

## 2022-08-08 ENCOUNTER — Telehealth: Payer: Self-pay | Admitting: *Deleted

## 2022-08-08 ENCOUNTER — Encounter: Payer: Self-pay | Admitting: Physical Therapy

## 2022-08-08 DIAGNOSIS — R2689 Other abnormalities of gait and mobility: Secondary | ICD-10-CM | POA: Insufficient documentation

## 2022-08-08 DIAGNOSIS — R32 Unspecified urinary incontinence: Secondary | ICD-10-CM | POA: Insufficient documentation

## 2022-08-08 DIAGNOSIS — R278 Other lack of coordination: Secondary | ICD-10-CM

## 2022-08-08 DIAGNOSIS — R42 Dizziness and giddiness: Secondary | ICD-10-CM | POA: Insufficient documentation

## 2022-08-08 NOTE — Telephone Encounter (Signed)
Pt will call back to schedule at her convenience

## 2022-08-08 NOTE — Patient Instructions (Signed)
  Avoid straining pelvic floor, abdominal muscles , spine  Use log rolling technique instead of getting out of bed with your neck or the sit-up     Log rolling into and out of bed   Log rolling into and out of bed If getting out of bed on R side, Bent knees, scoot hips/ shoulder to L  Raise R arm completely overhead, rolling onto armpit  Then lower bent knees to bed to get into complete side lying position  Then drop legs off bed, and push up onto R elbow/forearm, and use L hand to push onto the bed    Dig elbows and feet to lift hte buttocks and scoot without lifting head   __    Clam Shell 45 Degrees  Lying with hips and knees bent 45, one pillow between knees and ankles. Heel together, toes apart like ballerina,  Lift knee with exhale while pressing heels together. Be sure pelvis does not roll backward. Do not arch back. Do 20 times, each leg, 2 times per day.

## 2022-08-08 NOTE — Therapy (Signed)
OUTPATIENT PHYSICAL THERAPY EVALUATION   Patient Name: Lynn Mann MRN: [id number] DOB:03-22-1951, 72 y.o., female Today's Date: 08/09/2022   PT End of Session - 08/08/22 1434     Visit Number 1    Number of Visits 10    Date for PT Re-Evaluation 10/17/22    Authorization Type eval 08/08/22    PT Start Time 1420    PT Stop Time 1500    PT Time Calculation (min) 40 min    Activity Tolerance Patient tolerated treatment well;No increased pain    Behavior During Therapy WFL for tasks assessed/performed             Past Medical History:  Diagnosis Date   Arthritis    C. difficile diarrhea    age 70s-40s    Chicken pox    Cholecystitis 11/2011   Did not require sgy - Dr. Staci Acosta - [facility]  (cholelithiasis)   H/O Clostridium difficile infection    IBS (irritable bowel syndrome)    MRSA exposure 2005   Spider bite   MVP (mitral valve prolapse)    Stable - Dr. Ubaldo Glassing   Rheumatic fever    Past Surgical History:  Procedure Laterality Date   CATARACT EXTRACTION  [id number]   MUSCLE BIOPSY     Patient Active Problem List   Diagnosis Date Noted   Estrogen deficiency 06/23/2022   Nocturia 03/12/2022   Snoring 03/12/2022   Muscle weakness 03/11/2022   Joint ache 02/11/2022   Ankle pain 02/05/2022   Fever 12/26/2021   Abdominal pain 12/26/2021   Cough 09/07/2021   Right shoulder pain 07/15/2021   Hematoma 02/11/2021   Ankle swelling 11/26/2020   Low back pain 11/22/2020   Hip pain, right 11/22/2020   Light headedness 06/24/2020   Hearing loss 06/24/2020   Binocular vision disorder with diplopia 06/24/2020   Stress 05/17/2020   Chest pain 05/16/2020   Welcome to Medicare preventive visit 06/20/2019   Viral syndrome 01/30/2019   Itching 07/21/2017   Asthma 07/16/2016   Urinary incontinence 07/16/2016   SOB (shortness of breath) 04/01/2016   Bilateral shoulder pain 02/24/2016   Fatigue 01/28/2016   Neck fullness 01/28/2016   Routine general medical examination at  a health care facility 07/25/2015   Health care maintenance 10/09/2014   Neck pain 11/26/2013   Hypercholesterolemia 11/26/2013   History of colonic polyps 05/11/2013   Hyperbilirubinemia 01/18/2013   GERD (gastroesophageal reflux disease) 12/03/2012   Cholelithiasis 11/07/2012   IBS (irritable bowel syndrome) 11/07/2012   History of rheumatic fever 08/28/2012   MVP (mitral valve prolapse) 08/28/2012    PCP: Nicki Reaper   REFERRING PROVIDER: Nicki Reaper   REFERRING DIAG:  Diagnosis  R32 (ICD-10-CM) - Urinary incontinence, unspecified type   Rationale for Evaluation and Treatment Rehabilitation  THERAPY DIAG:  Other abnormalities of gait and mobility  Other lack of coordination  ONSET DATE:   SUBJECTIVE:  SUBJECTIVE STATEMENT:  Pt is interested in having Pelvic PT assess her new Dx of umbilical hernia and strengthen her abdominal muscles and more control of her incontinence. The incontinence got better after she got COVID and then she noticed it was a problem again.   Pt is due sleep study. When she got COvid and was on liquid diet , she was only getting up to urinate 2 x night compared to 4-5 x night. Now she is recovered, she is going 3-4 x night.   Pt is due for a colonoscopy  but has not made an appt yet.   1) low abdominal pain L and R, 2-7 /10 pain which occurs after she eats. It has been better since she has been blending things. There is discomfort when she is eating too much.   2) urinary leakage: Pt reports her leakage is better and not longer flooding since her last pelvic PT sessions. Pt kept with the exercises for 10 days.   Pt is due for a cystoscopy and is delaying that because her abdominal pain from her gastroenteritis.  Pt changes 1-2 depends per day. The last couple of days it has been  more but not as many as before.   Pt wants to go the gym and do water walking.   PERTINENT HISTORY:  See above   PAIN:  Are you having pain? No just discomfort at the B low abdominal area   WEIGHT BEARING RESTRICTIONS: No    FALLS:  Has patient fallen in last 6 months? No  LIVING ENVIRONMENT: Lives with: lives alone Lives in: House/apartment Stairs: no  Has following equipment at home: None  OCCUPATION: retired, widow   PLOF: Independent  PATIENT GOALS:   strengthen her abdominal muscles and more control of her incontinence.    OBJECTIVE:    [facility] PT Assessment - 08/09/22 1141       Observation/Other Assessments   Observations less forward head posture , rounded shoulders , slumped sitting      Strength   Overall Strength Comments R hip abd 2/5, L 3-/5,  hip flex/ knee flex/ ext 3++/5 B      Palpation   SI assessment  L shoulder and iliac crest higher             [facility] Adult PT Treatment/Exercise - 08/09/22 1140       Therapeutic Activites    Other Therapeutic Activities deferred her questions about eating. diet to nutritionist referral, discussed strategies for HEP compliance, discussed realignment of spine at upcoming sessions and then deep core HEP                HOME EXERCISE PROGRAM: See pt instruction section    ASSESSMENT:  CLINICAL IMPRESSION:   Pt is a 72   yo  who presents with  low abdominal pain and urinary leakage   which impact QOL, ADL, fitness, and community activities.   Pt's musculoskeletal assessment revealed uneven pelvic girdle and shoulder height, dyscoordination and strength of pelvic floor mm, hip weakness, poor body mechanics which places strain on the abdominal/pelvic floor mm. These are deficits that indicate an ineffective intraabdominal pressure system associated with increased risk for pt's Sx.      Pt will benefit from coordination training and education on fitness and functional positions in order to gain a  more effective intraabdominal pressure system to minimize urinary leakage.  Pt was provided education on etiology of Sx with anatomy, physiology explanation with images along with  the benefits of customized pelvic PT Tx based on pt's medical conditions and musculoskeletal deficits.  Explained the physiology of deep core mm coordination and roles of pelvic floor function in urination, defecation, sexual function, and postural control with deep core mm system.   Regional interdependent approaches will yield greater benefits in pt's POC due to the complexity of pt's medical Hx .  Plan to address uneven pelvic girdle and spinal at next session. Deferred pt's diet and food questions to nutritionist as pt is having difficulty eating solid foods after having contracted COVID over Xmas and dealing with GI issues. Nutritionist information was emailed to pt.   Pt benefits from skilled PT.    OBJECTIVE IMPAIRMENTS decreased activity tolerance, decreased coordination, decreased endurance, decreased mobility, difficulty walking, decreased ROM, decreased strength, decreased safety awareness, hypomobility, increased muscle spasms, impaired flexibility, improper body mechanics, postural dysfunction, and pain. scar restrictions   ACTIVITY LIMITATIONS  self-care,  sleep, home chores, work tasks , fitness   PARTICIPATION LIMITATIONS:  community, gym activities    [place] workup for GI and recovery from [place]    are also affecting patient's functional outcome.    REHAB POTENTIAL: Good   CLINICAL DECISION MAKING: Evolving/moderate complexity   EVALUATION COMPLEXITY: Moderate    PATIENT EDUCATION:    Education details: Showed pt anatomy images. Explained muscles attachments/ connection, physiology of deep core system/ spinal- thoracic-pelvis-lower kinetic chain as they relate to pt's presentation, Sx, and past Hx. Explained what and how these areas of deficits need to be restored to balance  and function    See Therapeutic activity / neuromuscular re-education section  Answered pt's questions.   Person educated: Patient Education method: Explanation, Demonstration, Tactile cues, Verbal cues, and Handouts Education comprehension: verbalized understanding, returned demonstration, verbal cues required, tactile cues required, and needs further education     PLAN: PT FREQUENCY: 1x/week   PT DURATION: 10 weeks   PLANNED INTERVENTIONS: Therapeutic exercises, Therapeutic activity, Neuromuscular re-education, Balance training, Gait training, Patient/Family education, Self Care, Joint mobilization, Spinal mobilization, Moist heat, Taping, and Manual therapy, dry needling.   PLAN FOR NEXT SESSION: See clinical impression for plan     GOALS: Goals reviewed with patient? Yes  SHORT TERM GOALS: Target date: 09/05/2022    Pt will demo IND with HEP                    Baseline: Not IND            Goal status: INITIAL   LONG TERM GOALS: Target date: 10/17/2022    1.Pt will demo proper deep core coordination without chest breathing and optimal excursion of diaphragm/pelvic floor in order to promote spinal stability and pelvic floor function  Baseline: dyscoordination Goal status: INITIAL  2.  Pt will demo > 5 pt change on FOTO  to improve QOL and function   Pelvic Pain baseline - PFDI Urinary baseline - Bowel  constipation baseline - Bowel Leakage baseline - Urinary Problem baseline- PFDI Bowel -    Goal status: INITIAL  3.  Pt will demo proper body mechanics in against gravity tasks and ADLs  work tasks, fitness  to minimize straining pelvic floor / back                  Baseline: not IND, improper form that places strain on pelvic floor                Goal status: INITIAL  4. Pt will demo levelled pelvic girdle and shoulder height in order to progress to deep core strengthening HEP and restore mobility at spine, pelvis, gait, posture   Baseline: L shoulder  and iliac crest higher  Goal status: INITIAL    5. Pt will increase hip abd B > 4/5 in order to improve pelvic girdle stability and minimize urinary leakage  Baseline: R hip abd 2/5, L 3-/5,  Goal status: INITIAL      Jerl Mina, PT 08/09/2022, 11:44 AM

## 2022-08-08 NOTE — Telephone Encounter (Signed)
        Patient  visited [facility] on 08/03/2022  for chest pain    Telephone encounter attempt :  1st  A HIPAA compliant voice message was left requesting a return call.  Instructed patient to call back at [phone].  [place] [phone] [address] , [place] [address] Email : [email or web address]

## 2022-08-12 ENCOUNTER — Other Ambulatory Visit: Payer: PPO | Admitting: Urology

## 2022-08-16 ENCOUNTER — Ambulatory Visit: Payer: PPO | Admitting: Physical Therapy

## 2022-08-16 DIAGNOSIS — R2689 Other abnormalities of gait and mobility: Secondary | ICD-10-CM | POA: Diagnosis not present

## 2022-08-16 DIAGNOSIS — R278 Other lack of coordination: Secondary | ICD-10-CM

## 2022-08-16 NOTE — Therapy (Signed)
OUTPATIENT PHYSICAL THERAPY TREATMENT    Patient Name: Lynn Mann MRN: [id number] DOB:05/09/1951, 72 y.o., female Today's Date: 08/16/2022   PT End of Session - 08/16/22 0909     Visit Number 2    Number of Visits 10    Date for PT Re-Evaluation 10/17/22    Authorization Type eval 08/08/22    PT Start Time 0907    PT Stop Time 0945    PT Time Calculation (min) 38 min    Activity Tolerance Patient tolerated treatment well;No increased pain    Behavior During Therapy WFL for tasks assessed/performed             Past Medical History:  Diagnosis Date   Arthritis    C. difficile diarrhea    age 60s-40s    Chicken pox    Cholecystitis 11/2011   Did not require sgy - Dr. Staci Acosta - [facility]  (cholelithiasis)   H/O Clostridium difficile infection    IBS (irritable bowel syndrome)    MRSA exposure 2005   Spider bite   MVP (mitral valve prolapse)    Stable - Dr. Ubaldo Glassing   Rheumatic fever    Past Surgical History:  Procedure Laterality Date   CATARACT EXTRACTION  [id number]   MUSCLE BIOPSY     Patient Active Problem List   Diagnosis Date Noted   Estrogen deficiency 06/23/2022   Nocturia 03/12/2022   Snoring 03/12/2022   Muscle weakness 03/11/2022   Joint ache 02/11/2022   Ankle pain 02/05/2022   Fever 12/26/2021   Abdominal pain 12/26/2021   Cough 09/07/2021   Right shoulder pain 07/15/2021   Hematoma 02/11/2021   Ankle swelling 11/26/2020   Low back pain 11/22/2020   Hip pain, right 11/22/2020   Light headedness 06/24/2020   Hearing loss 06/24/2020   Binocular vision disorder with diplopia 06/24/2020   Stress 05/17/2020   Chest pain 05/16/2020   Welcome to Medicare preventive visit 06/20/2019   Viral syndrome 01/30/2019   Itching 07/21/2017   Asthma 07/16/2016   Urinary incontinence 07/16/2016   SOB (shortness of breath) 04/01/2016   Bilateral shoulder pain 02/24/2016   Fatigue 01/28/2016   Neck fullness 01/28/2016   Routine general medical examination at  a health care facility 07/25/2015   Health care maintenance 10/09/2014   Neck pain 11/26/2013   Hypercholesterolemia 11/26/2013   History of colonic polyps 05/11/2013   Hyperbilirubinemia 01/18/2013   GERD (gastroesophageal reflux disease) 12/03/2012   Cholelithiasis 11/07/2012   IBS (irritable bowel syndrome) 11/07/2012   History of rheumatic fever 08/28/2012   MVP (mitral valve prolapse) 08/28/2012    PCP: Nicki Reaper   REFERRING PROVIDER: Nicki Reaper   REFERRING DIAG:  Diagnosis  R32 (ICD-10-CM) - Urinary incontinence, unspecified type   Rationale for Evaluation and Treatment Rehabilitation  THERAPY DIAG:  Other abnormalities of gait and mobility  Other lack of coordination  ONSET DATE:   SUBJECTIVE:                                 SUBJECTIVE TODAY :  Pt reports she still feels her endurance is low in her recoevery from [place] and stomach bug. But she is able to do more household chores but not going out too much in the community. Pt takes breaks between activities. Pt is able to eat more food if she blends her food so her energy level is better.   Pt is modifying heavy loads  into smaller loads like laundry and trash.   Pt has not had any sharp pains in low abdomen. There is discomfort on the L side last week and the two spots above the ASIS ( pt points to).                                                                                                                                                 SUBJECTIVE STATEMENT ON EVAL 08/09/22 :  Pt is interested in having Pelvic PT assess her new Dx of umbilical hernia and strengthen her abdominal muscles and more control of her incontinence. The incontinence got better after she got COVID and then she noticed it was a problem again.   Pt is due sleep study. When she got COvid and was on liquid diet , she was only getting up to urinate 2 x night compared to 4-5 x night. Now she is recovered, she is going 3-4 x night.   Pt is due for a  colonoscopy  but has not made an appt yet.   1) low abdominal pain L and R, 2-7 /10 pain which occurs after she eats. It has been better since she has been blending things. There is discomfort when she is eating too much.   2) urinary leakage: Pt reports her leakage is better and not longer flooding since her last pelvic PT sessions. Pt kept with the exercises for 10 days.   Pt is due for a cystoscopy and is delaying that because her abdominal pain from her gastroenteritis.  Pt changes 1-2 depends per day. The last couple of days it has been more but not as many as before.   Pt wants to go the gym and do water walking.   PERTINENT HISTORY:  See above   PAIN:  Are you having pain? No just discomfort at the B low abdominal area   WEIGHT BEARING RESTRICTIONS: No    FALLS:  Has patient fallen in last 6 months? No  LIVING ENVIRONMENT: Lives with: lives alone Lives in: House/apartment Stairs: no  Has following equipment at home: None  OCCUPATION: retired, widow   PLOF: Independent  PATIENT GOALS:   strengthen her abdominal muscles and more control of her incontinence.    OBJECTIVE:    [facility] PT Assessment - 08/16/22 0916       Sit to Stand   Comments use of BUE , unable to stand without BUE support      Palpation   Spinal mobility R rotation caused tightness at R  L/S    SI assessment  L shoulder and iliac crest higher      Ambulation/Gait   Gait Comments 1.3 m/s decreased stance on L,  excessive sway to R  ( with shoe lift in R shoe, reciporcal gait, 1.42 m/s )  [facility] Adult PT Treatment/Exercise - 08/16/22 0916       Therapeutic Activites    Other Therapeutic Activities instructed pt on how tyo place shoe lift in toe box/ heel  in R shoe      Neuro Re-ed    Neuro Re-ed Details  cued for sit to stand              [place]: See pt instruction section    ASSESSMENT:  CLINICAL IMPRESSION:   Addressed uneven pelvic girdle and  spine today with shoe lift in toe box and heel of R shoe. Pt demo'd increased gait and levelled pelvic and shoulder post Tx. Pt reported no pain at R LBP with R trunk rotation with shoe lift. Pt was instructed on how to place lift and demo'd IND.  Cued for sit to stand as pt still relies on BUE for support. Added Sit to stand with UE support into HEP.   Pt benefits from skilled PT. Plan to address deep core training next session   OBJECTIVE IMPAIRMENTS decreased activity tolerance, decreased coordination, decreased endurance, decreased mobility, difficulty walking, decreased ROM, decreased strength, decreased safety awareness, hypomobility, increased muscle spasms, impaired flexibility, improper body mechanics, postural dysfunction, and pain. scar restrictions   ACTIVITY LIMITATIONS  self-care,  sleep, home chores, work tasks , fitness   PARTICIPATION LIMITATIONS:  community, gym activities    [place] workup for GI and recovery from [place]    are also affecting patient's functional outcome.    REHAB POTENTIAL: Good   CLINICAL DECISION MAKING: Evolving/moderate complexity   EVALUATION COMPLEXITY: Moderate    PATIENT EDUCATION:    Education details: Showed pt anatomy images. Explained muscles attachments/ connection, physiology of deep core system/ spinal- thoracic-pelvis-lower kinetic chain as they relate to pt's presentation, Sx, and past Hx. Explained what and how these areas of deficits need to be restored to balance and function    See Therapeutic activity / neuromuscular re-education section  Answered pt's questions.   Person educated: Patient Education method: Explanation, Demonstration, Tactile cues, Verbal cues, and Handouts Education comprehension: verbalized understanding, returned demonstration, verbal cues required, tactile cues required, and needs further education     PLAN: PT FREQUENCY: 1x/week   PT DURATION: 10 weeks   PLANNED INTERVENTIONS:  Therapeutic exercises, Therapeutic activity, Neuromuscular re-education, Balance training, Gait training, Patient/Family education, Self Care, Joint mobilization, Spinal mobilization, Moist heat, Taping, and Manual therapy, dry needling.   PLAN FOR NEXT SESSION: See clinical impression for plan     GOALS: Goals reviewed with patient? Yes  SHORT TERM GOALS: Target date: 09/05/2022    Pt will demo IND with HEP                    Baseline: Not IND            Goal status: INITIAL   LONG TERM GOALS: Target date: 10/17/2022    1.Pt will demo proper deep core coordination without chest breathing and optimal excursion of diaphragm/pelvic floor in order to promote spinal stability and pelvic floor function  Baseline: dyscoordination Goal status: INITIAL  2.  Pt will demo > 5 pt change on FOTO  to improve QOL and function   Pelvic Pain baseline - PFDI Urinary baseline - Bowel  constipation baseline - Bowel Leakage baseline - Urinary Problem baseline- PFDI Bowel -    Goal status: INITIAL  3.  Pt will demo proper body mechanics in against gravity tasks  and ADLs  work tasks, fitness  to minimize straining pelvic floor / back                  Baseline: not IND, improper form that places strain on pelvic floor                Goal status: INITIAL    4. Pt will demo levelled pelvic girdle and shoulder height in order to progress to deep core strengthening HEP and restore mobility at spine, pelvis, gait, posture   Baseline: L shoulder and iliac crest higher  Goal status: INITIAL    5. Pt will increase hip abd B > 4/5 in order to improve pelvic girdle stability and minimize urinary leakage  Baseline: R hip abd 2/5, L 3-/5,  Goal status: INITIAL      Jerl Mina, PT 08/16/2022, 9:11 AM

## 2022-08-16 NOTE — Patient Instructions (Signed)
Five times sit to stand every hours   Proper body mechanics with getting out of a chair to decrease strain  on back &pelvic floor   Avoid holding your breath when Getting out of the chair:  Scoot to front part of chair chair Heels behind knees, feet are hip width apart, nose over toes  Inhale like you are smelling roses Exhale to stand    __  Wear shoe lift in toes and heels on RIGHT SHOE

## 2022-08-22 ENCOUNTER — Ambulatory Visit: Payer: PPO | Admitting: Physical Therapy

## 2022-08-22 DIAGNOSIS — R2689 Other abnormalities of gait and mobility: Secondary | ICD-10-CM | POA: Diagnosis not present

## 2022-08-22 DIAGNOSIS — R278 Other lack of coordination: Secondary | ICD-10-CM

## 2022-08-22 DIAGNOSIS — R42 Dizziness and giddiness: Secondary | ICD-10-CM

## 2022-08-22 NOTE — Patient Instructions (Signed)
   Clam Shell 45 Degrees  Lying with hips and knees bent 45, one pillow between knees and ankles. Heel together, toes apart like ballerina,  Lift knee with exhale while pressing heels together. Be sure pelvis does not roll backward. Do not arch back. Do 20 times, each leg, 2 times per day.    Complimentary stretch:  Figure-4  with a strap, elbows and shoulder down   3 breaths    ___   Lengthen Back rib by L  shoulder ( winging)    Lie on R  side , pillow between knees and under head  Pull  L arm overhead over mattress, grab the edge of mattress,pull it upward, drawing elbow away from ears  Breathing 10 reps  Brushing arm with 3/4 turn onto pillow behind back  Lying on R  side ,Pillow/ Block between knees     dragging top forearm across ribs below breast rotating 3/4 turn,  rotating  _L_ only this week ,  relax onto the pillow behind the back  and then back to other palm , maintain top palm on body whole top and not lift shoulder  ___

## 2022-08-22 NOTE — Therapy (Signed)
OUTPATIENT PHYSICAL THERAPY TREATMENT    Patient Name: Lynn Mann MRN: [id number] DOB:06/07/1951, 72 y.o., female Today's Date: 08/22/2022   PT End of Session - 08/22/22 1427     Visit Number 3    Number of Visits 10    Date for PT Re-Evaluation 10/17/22    Authorization Type eval 08/08/22    PT Start Time 1420    PT Stop Time 1505    PT Time Calculation (min) 45 min    Activity Tolerance Patient tolerated treatment well;No increased pain    Behavior During Therapy WFL for tasks assessed/performed             Past Medical History:  Diagnosis Date   Arthritis    C. difficile diarrhea    age 64s-40s    Chicken pox    Cholecystitis 11/2011   Did not require sgy - Dr. Staci Acosta - [facility]  (cholelithiasis)   H/O Clostridium difficile infection    IBS (irritable bowel syndrome)    MRSA exposure 2005   Spider bite   MVP (mitral valve prolapse)    Stable - Dr. Ubaldo Glassing   Rheumatic fever    Past Surgical History:  Procedure Laterality Date   CATARACT EXTRACTION  [id number]   MUSCLE BIOPSY     Patient Active Problem List   Diagnosis Date Noted   Estrogen deficiency 06/23/2022   Nocturia 03/12/2022   Snoring 03/12/2022   Muscle weakness 03/11/2022   Joint ache 02/11/2022   Ankle pain 02/05/2022   Fever 12/26/2021   Abdominal pain 12/26/2021   Cough 09/07/2021   Right shoulder pain 07/15/2021   Hematoma 02/11/2021   Ankle swelling 11/26/2020   Low back pain 11/22/2020   Hip pain, right 11/22/2020   Light headedness 06/24/2020   Hearing loss 06/24/2020   Binocular vision disorder with diplopia 06/24/2020   Stress 05/17/2020   Chest pain 05/16/2020   Welcome to Medicare preventive visit 06/20/2019   Viral syndrome 01/30/2019   Itching 07/21/2017   Asthma 07/16/2016   Urinary incontinence 07/16/2016   SOB (shortness of breath) 04/01/2016   Bilateral shoulder pain 02/24/2016   Fatigue 01/28/2016   Neck fullness 01/28/2016   Routine general medical examination at  a health care facility 07/25/2015   Health care maintenance 10/09/2014   Neck pain 11/26/2013   Hypercholesterolemia 11/26/2013   History of colonic polyps 05/11/2013   Hyperbilirubinemia 01/18/2013   GERD (gastroesophageal reflux disease) 12/03/2012   Cholelithiasis 11/07/2012   IBS (irritable bowel syndrome) 11/07/2012   History of rheumatic fever 08/28/2012   MVP (mitral valve prolapse) 08/28/2012    PCP: Nicki Reaper   REFERRING PROVIDER: Nicki Reaper   REFERRING DIAG:  Diagnosis  R32 (ICD-10-CM) - Urinary incontinence, unspecified type   Rationale for Evaluation and Treatment Rehabilitation  THERAPY DIAG:  Other abnormalities of gait and mobility  Other lack of coordination  Dizziness and giddiness  ONSET DATE:   SUBJECTIVE:                                 SUBJECTIVE TODAY :  Pt reports taking away the middle piece of shoe lift because it was bothering her.  SUBJECTIVE STATEMENT ON EVAL 08/09/22 :  Pt is interested in having Pelvic PT assess her new Dx of umbilical hernia and strengthen her abdominal muscles and more control of her incontinence. The incontinence got better after she got COVID and then she noticed it was a problem again.   Pt is due sleep study. When she got COvid and was on liquid diet , she was only getting up to urinate 2 x night compared to 4-5 x night. Now she is recovered, she is going 3-4 x night.   Pt is due for a colonoscopy  but has not made an appt yet.   1) low abdominal pain L and R, 2-7 /10 pain which occurs after she eats. It has been better since she has been blending things. There is discomfort when she is eating too much.   2) urinary leakage: Pt reports her leakage is better and not longer flooding since her last pelvic PT sessions. Pt kept with the exercises for 10 days.   Pt is due for a cystoscopy and is  delaying that because her abdominal pain from her gastroenteritis.  Pt changes 1-2 depends per day. The last couple of days it has been more but not as many as before.   Pt wants to go the gym and do water walking.   PERTINENT HISTORY:  See above   PAIN:  Are you having pain? No just discomfort at the B low abdominal area   WEIGHT BEARING RESTRICTIONS: No    FALLS:  Has patient fallen in last 6 months? No  LIVING ENVIRONMENT: Lives with: lives alone Lives in: House/apartment Stairs: no  Has following equipment at home: None  OCCUPATION: retired, widow   PLOF: Independent  PATIENT GOALS:   strengthen her abdominal muscles and more control of her incontinence.    OBJECTIVE:    [facility] PT Assessment - 08/22/22 1439       Palpation   Spinal mobility tightness along L medial scapular mm / paraspinal    SI assessment  ASIS levelled in standing and supine, L shoulder higher             [facility] Adult PT Treatment/Exercise - 08/22/22 1458       Neuro Re-ed    Neuro Re-ed Details  cued for stretches to lower L shoulder, cued for technique for clam      Modalities   Modalities Moist Heat      Moist Heat Therapy   Number Minutes Moist Heat 5 Minutes    Moist Heat Location --   L thoracic ( unbilled) L posterior rotation with props     Manual Therapy   Manual therapy comments STM/MWM at problem areas noted in assessment to promote levelled shoulder L ,              HOME EXERCISE PROGRAM: See pt instruction section    ASSESSMENT:  CLINICAL IMPRESSION: Pelvic girdle showed levelled alignment with shoe lift in R shoe. But L shoulder height remained higher than R which was addressed with manual Tx.  Pt reported no pain at sacrum post Tx while performing hip abduction with RLE.  Video recording was made on her phone per pt's request for L shoulder/ neck HEP which will help with compliance.  Anticipate shoulder height will improve with HEP and then address deep core  training next session   Pt benefits from skilled PT.    OBJECTIVE IMPAIRMENTS decreased activity tolerance, decreased coordination, decreased endurance, decreased mobility, difficulty  walking, decreased ROM, decreased strength, decreased safety awareness, hypomobility, increased muscle spasms, impaired flexibility, improper body mechanics, postural dysfunction, and pain. scar restrictions   ACTIVITY LIMITATIONS  self-care,  sleep, home chores, work tasks , fitness   PARTICIPATION LIMITATIONS:  community, gym activities    [place] workup for GI and recovery from [place]    are also affecting patient's functional outcome.    REHAB POTENTIAL: Good   CLINICAL DECISION MAKING: Evolving/moderate complexity   EVALUATION COMPLEXITY: Moderate    PATIENT EDUCATION:    Education details: Showed pt anatomy images. Explained muscles attachments/ connection, physiology of deep core system/ spinal- thoracic-pelvis-lower kinetic chain as they relate to pt's presentation, Sx, and past Hx. Explained what and how these areas of deficits need to be restored to balance and function    See Therapeutic activity / neuromuscular re-education section  Answered pt's questions.   Person educated: Patient Education method: Explanation, Demonstration, Tactile cues, Verbal cues, and Handouts Education comprehension: verbalized understanding, returned demonstration, verbal cues required, tactile cues required, and needs further education     PLAN: PT FREQUENCY: 1x/week   PT DURATION: 10 weeks   PLANNED INTERVENTIONS: Therapeutic exercises, Therapeutic activity, Neuromuscular re-education, Balance training, Gait training, Patient/Family education, Self Care, Joint mobilization, Spinal mobilization, Moist heat, Taping, and Manual therapy, dry needling.   PLAN FOR NEXT SESSION: See clinical impression for plan     GOALS: Goals reviewed with patient? Yes  SHORT TERM GOALS: Target date:  09/05/2022    Pt will demo IND with HEP                    Baseline: Not IND            Goal status: INITIAL   LONG TERM GOALS: Target date: 10/17/2022    1.Pt will demo proper deep core coordination without chest breathing and optimal excursion of diaphragm/pelvic floor in order to promote spinal stability and pelvic floor function  Baseline: dyscoordination Goal status: INITIAL  2.  Pt will demo > 5 pt change on FOTO  to improve QOL and function   Pelvic Pain baseline - PFDI Urinary baseline - Bowel  constipation baseline - Bowel Leakage baseline - Urinary Problem baseline- PFDI Bowel -    Goal status: INITIAL  3.  Pt will demo proper body mechanics in against gravity tasks and ADLs  work tasks, fitness  to minimize straining pelvic floor / back                  Baseline: not IND, improper form that places strain on pelvic floor                Goal status: INITIAL    4. Pt will demo levelled pelvic girdle and shoulder height in order to progress to deep core strengthening HEP and restore mobility at spine, pelvis, gait, posture   Baseline: L shoulder and iliac crest higher  Goal status: INITIAL    5. Pt will increase hip abd B > 4/5 in order to improve pelvic girdle stability and minimize urinary leakage  Baseline: R hip abd 2/5, L 3-/5,  Goal status: INITIAL      Jerl Mina, PT 08/22/2022, 3:39 PM

## 2022-08-30 ENCOUNTER — Encounter: Payer: PPO | Admitting: Physical Therapy

## 2022-09-03 ENCOUNTER — Telehealth: Payer: Self-pay | Admitting: Internal Medicine

## 2022-09-03 NOTE — Telephone Encounter (Signed)
Daughter stated that patient fell yesterday at the laundry mat and hit her head. Daughter says patient has been ok but refuses to call us or go anywhere to be evaluated. Daughter has been checking in with her periodically since fall and patient seems to have no acute issues. Daughter also noted that patient has been having frequent falls but refuses to report them to her doctor. Advised daughter since she hit her head- she needs to be evaluated to confirm nothing acute. Per daughter, patient refused but will keep trying to convince her. Also while on the phone daughter noted that patient has had a few episodes where she has gotten lost in town and they have to go find her and help her get home. She has not ate solid food except for cheetos since December- she puts everything in the blender. Daughter is concerned about her living alone but does not wish for me to share with pt that she called me. Daughter is not on the DPR so none of patients medical info was given. Advised daughter pt needs evaluation regarding the fall and I will f/u with patient.

## 2022-09-03 NOTE — Telephone Encounter (Signed)
Pt daughter called wanting the provider to call her regarding the pt

## 2022-09-03 NOTE — Telephone Encounter (Signed)
Agree with need for evaluation and follow up.

## 2022-09-04 ENCOUNTER — Ambulatory Visit (INDEPENDENT_AMBULATORY_CARE_PROVIDER_SITE_OTHER): Payer: PPO

## 2022-09-04 ENCOUNTER — Ambulatory Visit
Admission: EM | Admit: 2022-09-04 | Discharge: 2022-09-04 | Disposition: A | Discharge status: Home / Self Care | Payer: PPO | Attending: Family Medicine | Admitting: Family Medicine

## 2022-09-04 DIAGNOSIS — S59901A Unspecified injury of right elbow, initial encounter: Secondary | ICD-10-CM | POA: Diagnosis not present

## 2022-09-04 DIAGNOSIS — M79601 Pain in right arm: Secondary | ICD-10-CM

## 2022-09-04 DIAGNOSIS — R296 Repeated falls: Secondary | ICD-10-CM | POA: Diagnosis not present

## 2022-09-04 DIAGNOSIS — S6991XA Unspecified injury of right wrist, hand and finger(s), initial encounter: Secondary | ICD-10-CM | POA: Diagnosis not present

## 2022-09-04 DIAGNOSIS — S0990XA Unspecified injury of head, initial encounter: Secondary | ICD-10-CM | POA: Diagnosis not present

## 2022-09-04 DIAGNOSIS — W19XXXA Unspecified fall, initial encounter: Secondary | ICD-10-CM | POA: Diagnosis not present

## 2022-09-04 DIAGNOSIS — M189 Osteoarthritis of first carpometacarpal joint, unspecified: Secondary | ICD-10-CM | POA: Diagnosis not present

## 2022-09-04 DIAGNOSIS — M25532 Pain in left wrist: Secondary | ICD-10-CM

## 2022-09-04 NOTE — ED Provider Notes (Signed)
[facility]    CSN: [id number] Arrival date & time: 09/04/22  1548      History   Chief Complaint Chief Complaint  Patient presents with   Fall   Arm Pain   Head Injury    HPI  HPI Lynn Mann is a 72 y.o. female.   Lynn Mann presents after a fall on Monday night. She was taking clothes in the laundry mat and fell forward. She hit her head on the laundry mat door. She had impact to her head, knee and hands. States she called a friend who stayed with her. Yesterday, she felt fine but early this morning she got up to go to the bathroom and noticed a lump on her left wrist. She took Aleve. Feels like there is swelling above her left eye.   Has chronic back issues but has low back pain.  Has right elbow pain that "pings" when she touches it.   She did not hear any abnormal pops or sounds. Has "crunchy" neck sounds. Has bilateral knee and ankle pain that are sore the past couple of days. She has been walking fine.       Past Medical History:  Diagnosis Date   Arthritis    C. difficile diarrhea    age 48s-40s    Chicken pox    Cholecystitis 11/2011   Did not require sgy - Dr. Staci Acosta - [facility]  (cholelithiasis)   H/O Clostridium difficile infection    IBS (irritable bowel syndrome)    MRSA exposure 2005   Spider bite   MVP (mitral valve prolapse)    Stable - Dr. Ubaldo Glassing   Rheumatic fever     Patient Active Problem List   Diagnosis Date Noted   Estrogen deficiency 06/23/2022   Nocturia 03/12/2022   Snoring 03/12/2022   Muscle weakness 03/11/2022   Joint ache 02/11/2022   Ankle pain 02/05/2022   Fever 12/26/2021   Abdominal pain 12/26/2021   Cough 09/07/2021   Right shoulder pain 07/15/2021   Hematoma 02/11/2021   Ankle swelling 11/26/2020   Low back pain 11/22/2020   Hip pain, right 11/22/2020   Light headedness 06/24/2020   Hearing loss 06/24/2020   Binocular vision disorder with diplopia 06/24/2020   Stress 05/17/2020   Chest pain 05/16/2020    Welcome to Medicare preventive visit 06/20/2019   Viral syndrome 01/30/2019   Itching 07/21/2017   Asthma 07/16/2016   Urinary incontinence 07/16/2016   SOB (shortness of breath) 04/01/2016   Bilateral shoulder pain 02/24/2016   Fatigue 01/28/2016   Neck fullness 01/28/2016   Routine general medical examination at a health care facility 07/25/2015   Health care maintenance 10/09/2014   Neck pain 11/26/2013   Hypercholesterolemia 11/26/2013   History of colonic polyps 05/11/2013   Hyperbilirubinemia 01/18/2013   GERD (gastroesophageal reflux disease) 12/03/2012   Cholelithiasis 11/07/2012   IBS (irritable bowel syndrome) 11/07/2012   History of rheumatic fever 08/28/2012   MVP (mitral valve prolapse) 08/28/2012    Past Surgical History:  Procedure Laterality Date   CATARACT EXTRACTION  [id number]   MUSCLE BIOPSY      OB History   No obstetric history on file.      Home Medications    Prior to Admission medications   Medication Sig Start Date End Date Taking? Authorizing Provider  albuterol (VENTOLIN HFA) 108 (90 Base) MCG/ACT inhaler Inhale 2 puffs into the lungs every 6 (six) hours as needed. 03/11/22  Yes [provider]  aspirin 325 MG tablet Take 325 mg by mouth. Takes it occasionally. Has not taken lately   Yes [provider]  Calcium Carbonate-Vitamin D (CALCIUM-VITAMIN D3 PO) Take by mouth.   Yes [provider]  cetirizine (ZYRTEC) 10 MG tablet Take 1 tablet (10 mg total) by mouth daily as needed for allergies. 05/08/22  Yes McLean-Scocuzza, Nino Glow, MD  Cholecalciferol (VITAMIN D3 PO) Take 1 tablet by mouth daily.   Yes [provider]  fluticasone (FLONASE) 50 MCG/ACT nasal spray Place 2 sprays into both nostrils daily. prn 10/25/21  Yes McLean-Scocuzza, Nino Glow, MD  HYDROcodone-acetaminophen (NORCO/VICODIN) 5-325 MG tablet Take 1 tablet by mouth every 6 (six) hours as needed. 08/03/22  Yes Ward, Delice Bison, DO  Multiple Vitamin  (MULTIVITAMIN) tablet Take 1 tablet by mouth daily.   Yes [provider]  Multiple Vitamins-Minerals (PRESERVISION AREDS 2 PO) Take by mouth.   Yes [provider]  Polyvinyl Alcohol-Povidone (REFRESH OP) Apply 1 drop to eye as needed.   Yes [provider]  rosuvastatin (CRESTOR) 5 MG tablet Take 1 tablet (5 mg total) by mouth daily. 07/01/22  Yes Einar Pheasant, MD  ciprofloxacin-dexamethasone (CIPRODEX) OTIC suspension Place 4 drops into the left ear 2 (two) times daily. X4-7 days Patient not taking: Reported on 06/24/2022 05/08/22   McLean-Scocuzza, Nino Glow, MD  dicyclomine (BENTYL) 10 MG capsule Take 1 capsule (10 mg total) by mouth 3 (three) times daily as needed for spasms. 07/25/22   Harvest Dark, MD  neomycin-polymyxin-hydrocortisone (CORTISPORIN) OTIC solution Place 4 drops into the left ear 3 (three) times daily. X4-7 days, ciprodex too expensive Patient not taking: Reported on 06/24/2022 05/09/22   McLean-Scocuzza, Nino Glow, MD  ondansetron (ZOFRAN-ODT) 4 MG disintegrating tablet Take 1 tablet (4 mg total) by mouth every 6 (six) hours as needed for nausea or vomiting. 08/03/22   Ward, Delice Bison, DO  Vibegron (GEMTESA) 75 MG TABS Take 75 mg by mouth daily. 06/17/22   MacDiarmid, Nicki Reaper, MD  Vibegron (GEMTESA) 75 MG TABS Take 75 mg by mouth daily. 06/17/22   Bjorn Loser, MD    Family History Family History  Problem Relation Age of Onset   Arthritis Mother    Stroke Mother    Diabetes Mother    Hypertension Mother    Arthritis Father    Stroke Father    Diabetes Paternal Grandmother    Cancer Paternal Uncle        colon   Heart disease Other        maternal and paternal side   Breast cancer Paternal 35    Breast cancer Cousin    Breast cancer Cousin        female cousin   Breast cancer Cousin     Social History Social History   Tobacco Use   Smoking status: Never    Passive exposure: Never   Smokeless tobacco: Never  Vaping Use    Vaping Use: Never used  Substance Use Topics   Alcohol use: Not Currently    Comment: Rarely   Drug use: No     Allergies   Other, Adhesive [tape], Fd&c yellow #5 (tartrazine), and Yellow dyes (non-tartrazine)   Review of Systems Review of Systems: :negative unless otherwise stated in HPI.      Physical Exam Triage Vital Signs ED Triage Vitals  Enc Vitals Group     BP 09/04/22 1647 122/62     Pulse Rate 09/04/22 1647 73     Resp --  Temp 09/04/22 1647 99 F (37.2 C)     Temp Source 09/04/22 1647 Oral     SpO2 09/04/22 1647 95 %     Weight 09/04/22 1641 170 lb (77.1 kg)     Height 09/04/22 1641 '5\' 7"'$  (1.702 m)     Head Circumference --      Peak Flow --      Pain Score 09/04/22 1638 3     Pain Loc --      Pain Edu? --      Excl. in [place]? --    No data found.  Updated Vital Signs BP 122/62 (BP Location: Left Arm)   Pulse 73   Temp 99 F (37.2 C) (Oral)   Ht '5\' 7"'$  (1.702 m)   Wt 77.1 kg   SpO2 95%   BMI 26.63 kg/m   Visual Acuity Right Eye Distance:   Left Eye Distance:   Bilateral Distance:    Right Eye Near:   Left Eye Near:    Bilateral Near:     Physical Exam GEN: Alert, female in no acute distress  EYES: Extraocular movements intact, pupils equal round and reactive to light HENT: Moist mucous membranes, no oropharyngeal lesions, no blood visble, no hemotympanum, left brow hematoma NECK: Normal range of motion, no midline cervical spinous tenderness or paraspinal tenderness bilaterally CV: regular rate and rhythm, no chest wall trauma RESP: no increased work of breathing, clear to ascultation bilaterally MSK: Full flexion and extension of the right upper extremity, tenderness over the olecranon process, right wrist near the scaphoid has tenderness, left CMC joint tenderness SKIN: warm, dry, no abrasions NEURO: alert, moves all extremities appropriately, strength 4+/5 bilateral upper and lower extremities, alert and oriented at baseline, normal  speech PSYCH: Normal affect, appropriate speech and behavior     UC Treatments / Results  Labs (all labs ordered are listed, but only abnormal results are displayed) Labs Reviewed - No data to display  EKG   Radiology CT Head Wo Contrast  Result Date: 09/04/2022 CLINICAL DATA:  Trauma. EXAM: CT HEAD WITHOUT CONTRAST TECHNIQUE: Contiguous axial images were obtained from the base of the skull through the vertex without intravenous contrast. RADIATION DOSE REDUCTION: This exam was performed according to the departmental dose-optimization program which includes automated exposure control, adjustment of the mA and/or kV according to patient size and/or use of iterative reconstruction technique. COMPARISON:  Head CT dated 12/26/2015. FINDINGS: Brain: The ventricles and sulci are appropriate size for the patient's age. The gray-white matter discrimination is preserved. There is no acute intracranial hemorrhage. No mass effect or midline shift. No extra-axial fluid collection. Vascular: No hyperdense vessel or unexpected calcification. Skull: Normal. Negative for fracture or focal lesion. Sinuses/Orbits: No acute finding. Other: None IMPRESSION: No acute intracranial pathology. Electronically Signed   By: Anner Crete M.D.   On: 09/04/2022 18:36   DG Wrist Complete Left  Result Date: 09/04/2022 CLINICAL DATA:  Fall with pain. EXAM: LEFT WRIST - COMPLETE 3+ VIEW COMPARISON:  Left forearm radiographs dated 09/26/2021. FINDINGS: There is no evidence of fracture or dislocation. There is moderate osteoarthritis at the first carpometacarpal joint. Soft tissues are unremarkable. IMPRESSION: 1. No acute osseous injury. Electronically Signed   By: Zerita Boers M.D.   On: 09/04/2022 18:05   DG Elbow Complete Right  Result Date: 09/04/2022 CLINICAL DATA:  Fall and trauma to the right upper extremity. EXAM: RIGHT ELBOW - COMPLETE 3+ VIEW; RIGHT WRIST - COMPLETE 3+ VIEW COMPARISON:  None Available. FINDINGS: No  acute fracture or dislocation. The bones are osteopenic. The soft tissues are unremarkable. IMPRESSION: No acute fracture or dislocation. Electronically Signed   By: Anner Crete M.D.   On: 09/04/2022 17:59   DG Wrist Complete Right  Result Date: 09/04/2022 CLINICAL DATA:  Fall and trauma to the right upper extremity. EXAM: RIGHT ELBOW - COMPLETE 3+ VIEW; RIGHT WRIST - COMPLETE 3+ VIEW COMPARISON:  None Available. FINDINGS: No acute fracture or dislocation. The bones are osteopenic. The soft tissues are unremarkable. IMPRESSION: No acute fracture or dislocation. Electronically Signed   By: Anner Crete M.D.   On: 09/04/2022 17:59    Procedures Procedures (including critical care time)  Medications Ordered in UC Medications - No data to display  Initial Impression / Assessment and Plan / UC Course  I have reviewed the triage vital signs and the nursing notes.  Pertinent labs & imaging results that were available during my care of the patient were reviewed by me and considered in my medical decision making (see chart for details).      Pt is a 72 y.o.  female with 2 days of right arm pain and left wrist pain after a fall.  She has new hematoma to her head.  Obtained CT head, right upper extremity and left wrist imaging.  CT head was unremarkable.  There is no wrist fracture seen in the right or left wrist.  There was also no olecranon fractures or abnormalities in the elbow.  Patient updated with results.  Patient to gradually return to normal activities, as tolerated and continue ordinary activities within the limits permitted by pain.  Ibuprofen and Tylenol PRN. Counseled patient on red flag symptoms and when to seek immediate care. No red flags such as progressive major motor weakness.   Patient to follow up with orthopedic provider, if symptoms do not improve with conservative treatment.  Return and ED precautions given. Understanding voiced. Discussed MDM, treatment plan and plan  for follow-up with patient who agrees with plan.   Final Clinical Impressions(s) / UC Diagnoses   Final diagnoses:  Injury of head, initial encounter  Right arm pain  Left wrist pain  Fall, initial encounter  Frequent falls     Discharge Instructions      Your head CAT scan and imaging of your elbow/wrist were normal. There were no fracutres seen. For pain, take Tylenol and/or  Motrin as needed.  Apply cold compresses and/or lidocaine patches for additional pain relief. Your pain should start to improve over the next 1-2 weeks. If pain persists, follow up with your orthopedic provider.      ED Prescriptions   None    PDMP not reviewed this encounter.   Lyndee Hensen, DO 09/04/22 2345

## 2022-09-04 NOTE — Discharge Instructions (Addendum)
Your head CAT scan and imaging of your elbow/wrist were normal. There were no fracutres seen. For pain, take Tylenol and/or  Motrin as needed.  Apply cold compresses and/or lidocaine patches for additional pain relief. Your pain should start to improve over the next 1-2 weeks. If pain persists, follow up with your orthopedic provider.

## 2022-09-04 NOTE — ED Triage Notes (Signed)
Pt has new swelling and eye brow drooping on the left side. Pt was not aware of it upon beginning of triage.

## 2022-09-04 NOTE — Telephone Encounter (Signed)
Called patient. Unable to leave message.

## 2022-09-04 NOTE — ED Triage Notes (Signed)
Pt c/o lump on left forehead, left wrist pain, and right arm pain  Pt was going to the laundry mat and fell forward. Pt hit her head against the door and landed on her forearms.  Pt states that she was shaky but did not black out nor have any vertigo.   Pt denies any headaches but has back pain.  Pt states that the bottom of her hands and wrists were bluish in color this morning and pt has a raised portion of her left wrist that hurts when grabbing or flexing her wrist.  Pt gets pain along the right elbow when rubbing along the end of the upper arm.   Pt states she has "pings" in her head and describes the pain as if "someone stuck a needle in and took it out quickly" and that her neck "feels crunchy".

## 2022-09-05 ENCOUNTER — Ambulatory Visit: Payer: PPO | Attending: Internal Medicine | Admitting: Physical Therapy

## 2022-09-05 DIAGNOSIS — R2689 Other abnormalities of gait and mobility: Secondary | ICD-10-CM | POA: Diagnosis not present

## 2022-09-05 DIAGNOSIS — R278 Other lack of coordination: Secondary | ICD-10-CM | POA: Insufficient documentation

## 2022-09-05 NOTE — Telephone Encounter (Signed)
FYI- patient was evaluated per records in chart. Has f/u with you in a couple of weeks.

## 2022-09-06 NOTE — Therapy (Signed)
OUTPATIENT PHYSICAL THERAPY TREATMENT    Patient Name: Lynn Mann MRN: [id number] DOB:01-Feb-1951, 72 y.o., female Today's Date: 08/22/2022   PT End of Session - 09/05/22 1146     Visit Number 4    Number of Visits 10    Date for PT Re-Evaluation 10/17/22    Authorization Type eval 08/08/22    PT Start Time [id number]    PT Stop Time 1505    PT Time Calculation (min) 50 min    Activity Tolerance Patient tolerated treatment well;No increased pain    Behavior During Therapy WFL for tasks assessed/performed               Past Medical History:  Diagnosis Date   Arthritis    C. difficile diarrhea    age 8s-40s    Chicken pox    Cholecystitis 11/2011   Did not require sgy - Dr. Staci Acosta - [facility]  (cholelithiasis)   H/O Clostridium difficile infection    IBS (irritable bowel syndrome)    MRSA exposure 2005   Spider bite   MVP (mitral valve prolapse)    Stable - Dr. Ubaldo Glassing   Rheumatic fever    Past Surgical History:  Procedure Laterality Date   CATARACT EXTRACTION  [id number]   MUSCLE BIOPSY     Patient Active Problem List   Diagnosis Date Noted   Estrogen deficiency 06/23/2022   Nocturia 03/12/2022   Snoring 03/12/2022   Muscle weakness 03/11/2022   Joint ache 02/11/2022   Ankle pain 02/05/2022   Fever 12/26/2021   Abdominal pain 12/26/2021   Cough 09/07/2021   Right shoulder pain 07/15/2021   Hematoma 02/11/2021   Ankle swelling 11/26/2020   Low back pain 11/22/2020   Hip pain, right 11/22/2020   Light headedness 06/24/2020   Hearing loss 06/24/2020   Binocular vision disorder with diplopia 06/24/2020   Stress 05/17/2020   Chest pain 05/16/2020   Welcome to Medicare preventive visit 06/20/2019   Viral syndrome 01/30/2019   Itching 07/21/2017   Asthma 07/16/2016   Urinary incontinence 07/16/2016   SOB (shortness of breath) 04/01/2016   Bilateral shoulder pain 02/24/2016   Fatigue 01/28/2016   Neck fullness 01/28/2016   Routine general medical examination  at a health care facility 07/25/2015   Health care maintenance 10/09/2014   Neck pain 11/26/2013   Hypercholesterolemia 11/26/2013   History of colonic polyps 05/11/2013   Hyperbilirubinemia 01/18/2013   GERD (gastroesophageal reflux disease) 12/03/2012   Cholelithiasis 11/07/2012   IBS (irritable bowel syndrome) 11/07/2012   History of rheumatic fever 08/28/2012   MVP (mitral valve prolapse) 08/28/2012    PCP: Nicki Reaper   REFERRING PROVIDER: Nicki Reaper   REFERRING DIAG:  Diagnosis  R32 (ICD-10-CM) - Urinary incontinence, unspecified type   Rationale for Evaluation and Treatment Rehabilitation  THERAPY DIAG:  Other abnormalities of gait and mobility  Other lack of coordination  Dizziness and giddiness  ONSET DATE:   SUBJECTIVE:                                 SUBJECTIVE TODAY :  Pt experienced a fall at the [place]. Pt went to the ED and imaging tests were neg. Pt felt aches in her body  SUBJECTIVE STATEMENT ON EVAL 08/09/22 :  Pt is interested in having Pelvic PT assess her new Dx of umbilical hernia and strengthen her abdominal muscles and more control of her incontinence. The incontinence got  better after she got COVID and then she noticed it was a problem again.   Pt is due sleep study. When she got COvid and was on liquid diet , she was only getting up to urinate 2 x night compared to 4-5 x night. Now she is recovered, she is going 3-4 x night.   Pt is due for a colonoscopy  but has not made an appt yet.   1) low abdominal pain L and R, 2-7 /10 pain which occurs after she eats. It has been better since she has been blending things. There is discomfort when she is eating too much.   2) urinary leakage: Pt reports her leakage is better and not longer flooding since her last pelvic PT sessions. Pt kept with the exercises for 10 days.   Pt is due for a cystoscopy and is delaying that because her abdominal pain from her gastroenteritis.  Pt changes 1-2 depends per day. The  last couple of days it has been more but not as many as before.   Pt wants to go the gym and do water walking.   PERTINENT HISTORY:  See above   PAIN:  Are you having pain? No just discomfort at the B low abdominal area   WEIGHT BEARING RESTRICTIONS: No    FALLS:  Has patient fallen in last 6 months? No  LIVING ENVIRONMENT: Lives with: lives alone Lives in: House/apartment Stairs: no  Has following equipment at home: None  OCCUPATION: retired, widow   PLOF: Independent  PATIENT GOALS:   strengthen her abdominal muscles and more control of her incontinence.    OBJECTIVE:    [facility] PT Assessment -       Palpation   Spinal mobility tightness and hypomobile cervical thoracic segments at R    SI assessment  R shoulder higher      Bed Mobility   Bed Mobility --   poor technique            [facility] Adult PT Treatment/Exercise       Neuro Re-ed    Neuro Re-ed Details  cued for stretches, body mechanics for bed mobility to minimize pain      Modalities   Modalities Moist Heat      Moist Heat Therapy   Number Minutes Moist Heat 5 Minutes    Moist Heat Location --   thoracic /cervical (unbilled)     Manual Therapy   Manual therapy comments STM/MWM at problem areas noted in assessment to promote levelled shoulder R ,             HOME EXERCISE PROGRAM: See pt instruction section    ASSESSMENT:  CLINICAL IMPRESSION:   Pt experienced a fall at the [place]. Pt went to the ED and imaging tests were neg. Pt felt aches in her body. Assessment today addressed misalignment of C/T junction which provided relief for pt.  Pt required cues for body mechniacs with bed mobility to minimize pain and misalignment of spine.   Plan to address deep core training next session   Pt benefits from skilled PT.    OBJECTIVE IMPAIRMENTS decreased activity tolerance, decreased coordination, decreased endurance, decreased mobility, difficulty walking, decreased ROM,  decreased strength, decreased safety awareness, hypomobility, increased muscle spasms, impaired flexibility, improper body mechanics, postural dysfunction, and pain. scar restrictions   ACTIVITY LIMITATIONS  self-care,  sleep, home chores, work tasks , fitness   PARTICIPATION LIMITATIONS:  community, gym activities    [place]  medical workup for GI and recovery from COVID    are also affecting patient's functional outcome.    REHAB POTENTIAL: Good   CLINICAL DECISION MAKING: Evolving/moderate complexity   EVALUATION COMPLEXITY: Moderate    PATIENT EDUCATION:    Education details: Showed pt anatomy images. Explained muscles attachments/ connection, physiology of deep core system/ spinal- thoracic-pelvis-lower kinetic chain as they relate to pt's presentation, Sx, and past Hx. Explained what and how these areas of deficits need to be restored to balance and function    See Therapeutic activity / neuromuscular re-education section  Answered pt's questions.   Person educated: Patient Education method: Explanation, Demonstration, Tactile cues, Verbal cues, and Handouts Education comprehension: verbalized understanding, returned demonstration, verbal cues required, tactile cues required, and needs further education     PLAN: PT FREQUENCY: 1x/week   PT DURATION: 10 weeks   PLANNED INTERVENTIONS: Therapeutic exercises, Therapeutic activity, Neuromuscular re-education, Balance training, Gait training, Patient/Family education, Self Care, Joint mobilization, Spinal mobilization, Moist heat, Taping, and Manual therapy, dry needling.   PLAN FOR NEXT SESSION: See clinical impression for plan     GOALS: Goals reviewed with patient? Yes  SHORT TERM GOALS: Target date: 09/05/2022    Pt will demo IND with HEP                    Baseline: Not IND            Goal status: INITIAL   LONG TERM GOALS: Target date: 10/17/2022    1.Pt will demo proper deep core coordination  without chest breathing and optimal excursion of diaphragm/pelvic floor in order to promote spinal stability and pelvic floor function  Baseline: dyscoordination Goal status: INITIAL  2.  Pt will demo > 5 pt change on FOTO  to improve QOL and function   Pelvic Pain baseline - PFDI Urinary baseline - Bowel  constipation baseline - Bowel Leakage baseline - Urinary Problem baseline- PFDI Bowel -    Goal status: INITIAL  3.  Pt will demo proper body mechanics in against gravity tasks and ADLs  work tasks, fitness  to minimize straining pelvic floor / back                  Baseline: not IND, improper form that places strain on pelvic floor                Goal status: INITIAL    4. Pt will demo levelled pelvic girdle and shoulder height in order to progress to deep core strengthening HEP and restore mobility at spine, pelvis, gait, posture   Baseline: L shoulder and iliac crest higher  Goal status: INITIAL    5. Pt will increase hip abd B > 4/5 in order to improve pelvic girdle stability and minimize urinary leakage  Baseline: R hip abd 2/5, L 3-/5,  Goal status: INITIAL      Jerl Mina, PT 08/22/2022, 3:39 PM

## 2022-09-12 ENCOUNTER — Telehealth: Payer: Self-pay | Admitting: Internal Medicine

## 2022-09-12 ENCOUNTER — Encounter: Payer: PPO | Admitting: Physical Therapy

## 2022-09-12 NOTE — Telephone Encounter (Signed)
Pt would like to be called regarding her appointment and labs on next week

## 2022-09-13 ENCOUNTER — Other Ambulatory Visit: Payer: Self-pay

## 2022-09-13 ENCOUNTER — Telehealth: Payer: Self-pay | Admitting: Internal Medicine

## 2022-09-13 MED ORDER — ROSUVASTATIN CALCIUM 5 MG PO TABS: 5.0000 mg | ORAL_TABLET | Freq: Every day | ORAL | 2 refills | Status: DC

## 2022-09-13 NOTE — Telephone Encounter (Signed)
Spoke with Lynn Mann. She says that she wanted to let Dr Nicki Reaper know that she has been having ongoing GI issues for months and was googling and would like to have labs for celiac disease ordered. Advised that this is something we would need to discuss at her upcoming appt to determine if further testing is warranted.

## 2022-09-13 NOTE — Telephone Encounter (Signed)
Refill sent to [facility]  

## 2022-09-13 NOTE — Telephone Encounter (Signed)
Patient called office, she would like to add more labs to her labs and needs to discuss.

## 2022-09-13 NOTE — Telephone Encounter (Signed)
Patient missed placed her rosuvastatin (CRESTOR) 5 MG tablet, and needs another refill.

## 2022-09-13 NOTE — Telephone Encounter (Signed)
Will d/w her at her appt

## 2022-09-17 ENCOUNTER — Other Ambulatory Visit (INDEPENDENT_AMBULATORY_CARE_PROVIDER_SITE_OTHER): Payer: PPO

## 2022-09-17 DIAGNOSIS — E7800 Pure hypercholesterolemia, unspecified: Secondary | ICD-10-CM | POA: Diagnosis not present

## 2022-09-17 LAB — LIPID PANEL
Cholesterol: 184 mg/dL (ref 0–200)
HDL: 43.7 mg/dL (ref >39.00)
LDL Cholesterol: 105 mg/dL — ABNORMAL HIGH (ref 0–99)
NonHDL: 139.91
Total CHOL/HDL Ratio: 4
Triglycerides: 175 mg/dL — ABNORMAL HIGH (ref 0.0–149.0)
VLDL: 35 mg/dL (ref 0.0–40.0)

## 2022-09-17 LAB — TSH: TSH: 4.74 u[IU]/mL (ref 0.35–5.50)

## 2022-09-17 LAB — BASIC METABOLIC PANEL
BUN: 15 mg/dL (ref 6–23)
CO2: 30 mEq/L (ref 19–32)
Calcium: 9.5 mg/dL (ref 8.4–10.5)
Chloride: 101 mEq/L (ref 96–112)
Creatinine, Ser: 0.69 mg/dL (ref 0.40–1.20)
GFR: 88.18 mL/min (ref >60.00)
Glucose, Bld: 98 mg/dL (ref 70–99)
Potassium: 4.2 mEq/L (ref 3.5–5.1)
Sodium: 137 mEq/L (ref 135–145)

## 2022-09-17 LAB — HEPATIC FUNCTION PANEL
ALT: 12 U/L (ref 0–35)
AST: 18 U/L (ref 0–37)
Albumin: 4.1 g/dL (ref 3.5–5.2)
Alkaline Phosphatase: 64 U/L (ref 39–117)
Bilirubin, Direct: 0.1 mg/dL (ref 0.0–0.3)
Total Bilirubin: 0.7 mg/dL (ref 0.2–1.2)
Total Protein: 7 g/dL (ref 6.0–8.3)

## 2022-09-19 ENCOUNTER — Encounter: Payer: Self-pay | Admitting: Internal Medicine

## 2022-09-19 ENCOUNTER — Ambulatory Visit (INDEPENDENT_AMBULATORY_CARE_PROVIDER_SITE_OTHER): Payer: PPO | Admitting: Internal Medicine

## 2022-09-19 ENCOUNTER — Ambulatory Visit: Payer: PPO | Admitting: Physical Therapy

## 2022-09-19 VITALS — BP 120/72 | HR 90 | Temp 97.9°F | Resp 16 | Ht 67.0 in | Wt 172.0 lb

## 2022-09-19 DIAGNOSIS — K8020 Calculus of gallbladder without cholecystitis without obstruction: Secondary | ICD-10-CM

## 2022-09-19 DIAGNOSIS — Z86010 Personal history of colonic polyps: Secondary | ICD-10-CM

## 2022-09-19 DIAGNOSIS — K219 Gastro-esophageal reflux disease without esophagitis: Secondary | ICD-10-CM

## 2022-09-19 DIAGNOSIS — R2689 Other abnormalities of gait and mobility: Secondary | ICD-10-CM

## 2022-09-19 DIAGNOSIS — E7800 Pure hypercholesterolemia, unspecified: Secondary | ICD-10-CM | POA: Diagnosis not present

## 2022-09-19 DIAGNOSIS — R1084 Generalized abdominal pain: Secondary | ICD-10-CM | POA: Diagnosis not present

## 2022-09-19 DIAGNOSIS — J4520 Mild intermittent asthma, uncomplicated: Secondary | ICD-10-CM | POA: Diagnosis not present

## 2022-09-19 DIAGNOSIS — R278 Other lack of coordination: Secondary | ICD-10-CM

## 2022-09-19 MED ORDER — ESOMEPRAZOLE MAGNESIUM 40 MG PO CPDR: 40.0000 mg | DELAYED_RELEASE_CAPSULE | Freq: Every day | ORAL | 2 refills | Status: DC

## 2022-09-19 NOTE — Therapy (Signed)
OUTPATIENT PHYSICAL THERAPY TREATMENT    Patient Name: Lynn Mann MRN: [id number] DOB:Jan 12, 1951, 72 y.o., female Today's Date: 09/19/2022   PT End of Session - 09/05/22 1146     Visit Number 4    Number of Visits 10    Date for PT Re-Evaluation 10/17/22    Authorization Type eval 08/08/22    PT Start Time [id number]    PT Stop Time 1505    PT Time Calculation (min) 50 min    Activity Tolerance Patient tolerated treatment well;No increased pain    Behavior During Therapy WFL for tasks assessed/performed               Past Medical History:  Diagnosis Date   Arthritis    C. difficile diarrhea    age 27s-40s    Chicken pox    Cholecystitis 11/2011   Did not require sgy - Dr. Staci Acosta - [facility]  (cholelithiasis)   H/O Clostridium difficile infection    IBS (irritable bowel syndrome)    MRSA exposure 2005   Spider bite   MVP (mitral valve prolapse)    Stable - Dr. Ubaldo Glassing   Rheumatic fever    Past Surgical History:  Procedure Laterality Date   CATARACT EXTRACTION  [id number]   MUSCLE BIOPSY     Patient Active Problem List   Diagnosis Date Noted   Estrogen deficiency 06/23/2022   Nocturia 03/12/2022   Snoring 03/12/2022   Muscle weakness 03/11/2022   Joint ache 02/11/2022   Ankle pain 02/05/2022   Fever 12/26/2021   Abdominal pain 12/26/2021   Cough 09/07/2021   Right shoulder pain 07/15/2021   Hematoma 02/11/2021   Ankle swelling 11/26/2020   Low back pain 11/22/2020   Hip pain, right 11/22/2020   Light headedness 06/24/2020   Hearing loss 06/24/2020   Binocular vision disorder with diplopia 06/24/2020   Stress 05/17/2020   Chest pain 05/16/2020   Welcome to Medicare preventive visit 06/20/2019   Viral syndrome 01/30/2019   Itching 07/21/2017   Asthma 07/16/2016   Urinary incontinence 07/16/2016   SOB (shortness of breath) 04/01/2016   Bilateral shoulder pain 02/24/2016   Fatigue 01/28/2016   Neck fullness 01/28/2016   Routine general medical examination  at a health care facility 07/25/2015   Health care maintenance 10/09/2014   Neck pain 11/26/2013   Hypercholesterolemia 11/26/2013   History of colonic polyps 05/11/2013   Hyperbilirubinemia 01/18/2013   GERD (gastroesophageal reflux disease) 12/03/2012   Cholelithiasis 11/07/2012   IBS (irritable bowel syndrome) 11/07/2012   History of rheumatic fever 08/28/2012   MVP (mitral valve prolapse) 08/28/2012    PCP: Nicki Reaper   REFERRING PROVIDER: Nicki Reaper   REFERRING DIAG:  Diagnosis  R32 (ICD-10-CM) - Urinary incontinence, unspecified type   Rationale for Evaluation and Treatment Rehabilitation  THERAPY DIAG:  Other abnormalities of gait and mobility  Other lack of coordination  ONSET DATE:   SUBJECTIVE:                                 SUBJECTIVE TODAY :  Pt noticed her shoes scuffed when she walked on an incline which she thinks caused her fall at the laundry mat.     SUBJECTIVE STATEMENT ON EVAL 08/09/22 :  Pt is interested in having Pelvic PT assess her new Dx of umbilical hernia and strengthen her abdominal muscles and more control of her incontinence. The incontinence got better after she  got COVID and then she noticed it was a problem again.   Pt is due sleep study. When she got COvid and was on liquid diet , she was only getting up to urinate 2 x night compared to 4-5 x night. Now she is recovered, she is going 3-4 x night.   Pt is due for a colonoscopy  but has not made an appt yet.   1) low abdominal pain L and R, 2-7 /10 pain which occurs after she eats. It has been better since she has been blending things. There is discomfort when she is eating too much.   2) urinary leakage: Pt reports her leakage is better and not longer flooding since her last pelvic PT sessions. Pt kept with the exercises for 10 days.   Pt is due for a cystoscopy and is delaying that because her abdominal pain from her gastroenteritis.  Pt changes 1-2 depends per day. The last couple of days it  has been more but not as many as before.   Pt wants to go the gym and do water walking.   PERTINENT HISTORY:  See above   PAIN:  Are you having pain? No just discomfort at the B low abdominal area   WEIGHT BEARING RESTRICTIONS: No    FALLS:  Has patient fallen in last 6 months? No  LIVING ENVIRONMENT: Lives with: lives alone Lives in: House/apartment Stairs: no  Has following equipment at home: None  OCCUPATION: retired, widow   PLOF: Independent  PATIENT GOALS:   strengthen her abdominal muscles and more control of her incontinence.    OBJECTIVE:    [facility] PT Assessment - 09/19/22 1429       Coordination   Coordination and Movement Description ab overuse with deep core HEP      Other:   Other/Comments standing marches 2 min, with rapid breathing, sweating, report of 4-5/10 level of fatigue      Palpation   SI assessment  levelled shoulder and pelvic girdle    Palpation comment L SIJ mobile ( related to pt's c/o tender at the tailbone like a knot)      Transfers   Comments sit to stand with\ proper alignment without cues             [facility] Adult PT Treatment/Exercise - 09/19/22 1429       Therapeutic Activites    Other Therapeutic Activities discussed higher knees with wlaking to ensure not stub toe nor shoe on inclines / uneven surfaces to minimzie fall risk      Neuro Re-ed    Neuro Re-ed Details  cued for standing marching 2 min with higher knees, cued for less ab overuse  in deep core      Manual Therapy   Manual therapy comments STM/MWM at problem areas noted in assessment to promote R SIJ mobility               HOME EXERCISE PROGRAM: See pt instruction section    ASSESSMENT:  CLINICAL IMPRESSION:  Pt has been compliant with R shoe lift wear which has helped to to level out her pelvic and spinal alignment.    Pt required cues for deep core HEP to minimize ab mm overuse. Manual Tx helped to increase L SIJ which relieved the  tenderness at tailbone she reported.  Suspect that is related to the L leg being higher than R but pt has been compliant with wearing shoe lift in the R shoe which will  help minimize this SIJ hypomobility.   Discussed  and practiced higher knees to clear foot  with walking to ensure not stub toe nor shoe on inclines / uneven surfaces to minimize fall risk.  Plan to add more propioception training to address leg length difference to minimize falls.   Plan to assess pelvic floor and advance to kegel strengthening to address leakage issue  at next session.    Pt benefits from skilled PT.    OBJECTIVE IMPAIRMENTS decreased activity tolerance, decreased coordination, decreased endurance, decreased mobility, difficulty walking, decreased ROM, decreased strength, decreased safety awareness, hypomobility, increased muscle spasms, impaired flexibility, improper body mechanics, postural dysfunction, and pain. scar restrictions   ACTIVITY LIMITATIONS  self-care,  sleep, home chores, work tasks , fitness   PARTICIPATION LIMITATIONS:  community, gym activities    Halfway workup for GI and recovery from [place]    are also affecting patient's functional outcome.    REHAB POTENTIAL: Good   CLINICAL DECISION MAKING: Evolving/moderate complexity   EVALUATION COMPLEXITY: Moderate    PATIENT EDUCATION:    Education details: Showed pt anatomy images. Explained muscles attachments/ connection, physiology of deep core system/ spinal- thoracic-pelvis-lower kinetic chain as they relate to pt's presentation, Sx, and past Hx. Explained what and how these areas of deficits need to be restored to balance and function    See Therapeutic activity / neuromuscular re-education section  Answered pt's questions.   Person educated: Patient Education method: Explanation, Demonstration, Tactile cues, Verbal cues, and Handouts Education comprehension: verbalized understanding, returned demonstration,  verbal cues required, tactile cues required, and needs further education     PLAN: PT FREQUENCY: 1x/week   PT DURATION: 10 weeks   PLANNED INTERVENTIONS: Therapeutic exercises, Therapeutic activity, Neuromuscular re-education, Balance training, Gait training, Patient/Family education, Self Care, Joint mobilization, Spinal mobilization, Moist heat, Taping, and Manual therapy, dry needling.   PLAN FOR NEXT SESSION: See clinical impression for plan     GOALS: Goals reviewed with patient? Yes  SHORT TERM GOALS: Target date: 09/05/2022    Pt will demo IND with HEP                    Baseline: Not IND            Goal status: INITIAL   LONG TERM GOALS: Target date: 10/17/2022    1.Pt will demo proper deep core coordination without chest breathing and optimal excursion of diaphragm/pelvic floor in order to promote spinal stability and pelvic floor function  Baseline: dyscoordination Goal status: INITIAL  2.  Pt will demo > 5 pt change on FOTO  to improve QOL and function   Pelvic Pain baseline - PFDI Urinary baseline - Bowel  constipation baseline - Bowel Leakage baseline - Urinary Problem baseline- PFDI Bowel -    Goal status: INITIAL  3.  Pt will demo proper body mechanics in against gravity tasks and ADLs  work tasks, fitness  to minimize straining pelvic floor / back                  Baseline: not IND, improper form that places strain on pelvic floor                Goal status: INITIAL    4. Pt will demo levelled pelvic girdle and shoulder height in order to progress to deep core strengthening HEP and restore mobility at spine, pelvis, gait, posture   Baseline: L shoulder and iliac crest higher  Goal status: INITIAL    5. Pt will increase hip abd B > 4/5 in order to improve pelvic girdle stability and minimize urinary leakage  Baseline: R hip abd 2/5, L 3-/5,  Goal status: INITIAL      Jerl Mina, PT 09/19/2022, 2:27 PM

## 2022-09-19 NOTE — Progress Notes (Signed)
Subjective:    Patient ID: Lynn Mann, female    DOB: 12-23-1950, 72 y.o.   MRN: [id number]  Patient here for  Chief Complaint  Patient presents with   Medical Management of Chronic Issues    HPI Here to follow up regarding previous fall and GI issues.  Previously saw ENT - Epley maneuver - resolved vertigo.  Had persistent "spatial disorientation". Seeing PT.  Previous double vision.  MRI - unrevealing. Negative myasthenia labs.  Has seen cardiology. Holter no prolonged pauses.  Sleep study - no sleep apnea.  She underwent exercise stress echo where she went just over 4 minutes with no arrhythmia. Echo 11/21 images showed normal resting LV function with what appeared to be augmentation of all segments of the cardiac myocardium with stress. No significant valvular normalities. This did not suggest significant ischemia. No chest pain reported. [place] 08/2022 - seen urgent care - CT head unremarkable.  No wrist fracture.  No olecranon fractures. Seen ER 08/03/22 - upper abdominal pain and lower chest pain with pain radiating into her back. CXR - no acute abnormality. Ultrasound - cholelithiasis.  07/25/22 - ER - RLQ pain. Reports had cinnamon milkshake 12/24.  Noticed pain - epigastric pain.  Describes noticing pills getting stuck.  CT negative. Prescribed bentyl. Covid 06/2022. Diarrhea.  Describes bowels are now sluggish.  Discussed benefiber.  Drinking warm apple juice.  Last bowel movement - day before yesterday.  She is blending all of her food.  For the most part, she is not eating solid foods.  Saw pulmonary 06/24/22 - evaluation - recommended HST.  She denies any significant memory concerns.  States has no problems driving.     Past Medical History:  Diagnosis Date   Arthritis    C. difficile diarrhea    age 65s-40s    Chicken pox    Cholecystitis 11/2011   Did not require sgy - Dr. Staci Acosta - [facility]  (cholelithiasis)   H/O Clostridium difficile infection    IBS (irritable bowel  syndrome)    MRSA exposure 2005   Spider bite   MVP (mitral valve prolapse)    Stable - Dr. Ubaldo Glassing   Rheumatic fever    Past Surgical History:  Procedure Laterality Date   CATARACT EXTRACTION  [id number]   MUSCLE BIOPSY     Family History  Problem Relation Age of Onset   Arthritis Mother    Stroke Mother    Diabetes Mother    Hypertension Mother    Arthritis Father    Stroke Father    Diabetes Paternal Grandmother    Cancer Paternal Uncle        colon   Heart disease Other        maternal and paternal side   Breast cancer Paternal 62    Breast cancer Cousin    Breast cancer Cousin        female cousin   Breast cancer Cousin    Social History   Socioeconomic History   Marital status: Widowed    Spouse name: Not on file   Number of children: Not on file   Years of education: 16   Highest education level: Not on file  Occupational History   Occupation: Retired  Tobacco Use   Smoking status: Never    Passive exposure: Never   Smokeless tobacco: Never  Vaping Use   Vaping Use: Never used  Substance and Sexual Activity   Alcohol use: Not Currently    Comment:  Rarely   Drug use: No   Sexual activity: Yes    Partners: Male    Birth control/protection: Post-menopausal  Other Topics Concern   Not on file  Social History Narrative   Widowed    2 kids son and daughter   Social Determinants of Health   Financial Resource Strain: Low Risk  (07/01/2022)   Overall Financial Resource Strain (CARDIA)    Difficulty of Paying Living Expenses: Not hard at all  Food Insecurity: No Food Insecurity (07/01/2022)   Hunger Vital Sign    Worried About Running Out of Food in the Last Year: Never true    [place] in the Last Year: Never true  Transportation Needs: No Transportation Needs (07/01/2022)   PRAPARE - Hydrologist (Medical): No    Lack of Transportation (Non-Medical): No  Physical Activity: Insufficiently Active (06/27/2021)    Exercise Vital Sign    Days of Exercise per Week: 2 days    Minutes of Exercise per Session: 50 min  Stress: No Stress Concern Present (07/01/2022)   [place]    Feeling of Stress : Not at all  Social Connections: Moderately Integrated (07/01/2022)   Social Connection and Isolation Panel [NHANES]    Frequency of Communication with Friends and Family: More than three times a week    Frequency of Social Gatherings with Friends and Family: More than three times a week    Attends Religious Services: More than 4 times per year    Active Member of [employer] or Organizations: Yes    Attends Archivist Meetings: More than 4 times per year    Marital Status: Widowed     Review of Systems  Constitutional:  Negative for unexpected weight change.       Weight is stable from last check.  Blending foods.    HENT:  Negative for congestion and sinus pressure.   Respiratory:  Negative for cough, chest tightness and shortness of breath.   Cardiovascular:  Negative for chest pain and palpitations.  Gastrointestinal:  Negative for vomiting.       Describes bowels as sluggish now.   Genitourinary:  Negative for difficulty urinating and dysuria.  Musculoskeletal:  Negative for joint swelling and myalgias.  Skin:  Negative for color change and rash.  Neurological:  Negative for dizziness and headaches.  Psychiatric/Behavioral:  Negative for agitation and dysphoric mood.        Objective:     BP 120/72   Pulse 90   Temp 97.9 F (36.6 C)   Resp 16   Ht '5\' 7"'$  (1.702 m)   Wt 172 lb (78 kg)   SpO2 98%   BMI 26.94 kg/m  Wt Readings from Last 3 Encounters:  09/19/22 172 lb (78 kg)  09/04/22 170 lb (77.1 kg)  07/25/22 179 lb 14.3 oz (81.6 kg)    Physical Exam Vitals reviewed.  Constitutional:      General: She is not in acute distress.    Appearance: Normal appearance.  HENT:     Head: Normocephalic and atraumatic.      Right Ear: External ear normal.     Left Ear: External ear normal.  Eyes:     General: No scleral icterus.       Right eye: No discharge.        Left eye: No discharge.     Conjunctiva/sclera: Conjunctivae normal.  Neck:  Thyroid: No thyromegaly.  Cardiovascular:     Rate and Rhythm: Normal rate and regular rhythm.  Pulmonary:     Effort: No respiratory distress.     Breath sounds: Normal breath sounds. No wheezing.  Abdominal:     General: Bowel sounds are normal.     Palpations: Abdomen is soft.     Tenderness: There is no abdominal tenderness.  Musculoskeletal:        General: No swelling or tenderness.     Cervical back: Neck supple. No tenderness.  Lymphadenopathy:     Cervical: No cervical adenopathy.  Skin:    Findings: No erythema or rash.  Neurological:     Mental Status: She is alert.  Psychiatric:        Mood and Affect: Mood normal.        Behavior: Behavior normal.      Outpatient Encounter Medications as of 09/19/2022  Medication Sig   esomeprazole (NEXIUM) 40 MG capsule Take 1 capsule (40 mg total) by mouth daily.   albuterol (VENTOLIN HFA) 108 (90 Base) MCG/ACT inhaler Inhale 2 puffs into the lungs every 6 (six) hours as needed.   aspirin 325 MG tablet Take 325 mg by mouth. Takes it occasionally. Has not taken lately   Calcium Carbonate-Vitamin D (CALCIUM-VITAMIN D3 PO) Take by mouth.   cetirizine (ZYRTEC) 10 MG tablet Take 1 tablet (10 mg total) by mouth daily as needed for allergies.   Cholecalciferol (VITAMIN D3 PO) Take 1 tablet by mouth daily.   dicyclomine (BENTYL) 10 MG capsule Take 1 capsule (10 mg total) by mouth 3 (three) times daily as needed for spasms. (Patient not taking: Reported on 09/19/2022)   fluticasone (FLONASE) 50 MCG/ACT nasal spray Place 2 sprays into both nostrils daily. prn   HYDROcodone-acetaminophen (NORCO/VICODIN) 5-325 MG tablet Take 1 tablet by mouth every 6 (six) hours as needed.   Multiple Vitamin (MULTIVITAMIN) tablet  Take 1 tablet by mouth daily.   Multiple Vitamins-Minerals (PRESERVISION AREDS 2 PO) Take by mouth.   ondansetron (ZOFRAN-ODT) 4 MG disintegrating tablet Take 1 tablet (4 mg total) by mouth every 6 (six) hours as needed for nausea or vomiting.   Polyvinyl Alcohol-Povidone (REFRESH OP) Apply 1 drop to eye as needed.   rosuvastatin (CRESTOR) 5 MG tablet Take 1 tablet (5 mg total) by mouth daily.   Vibegron (GEMTESA) 75 MG TABS Take 75 mg by mouth daily. (Patient not taking: Reported on 09/19/2022)   [DISCONTINUED] ciprofloxacin-dexamethasone (CIPRODEX) OTIC suspension Place 4 drops into the left ear 2 (two) times daily. X4-7 days (Patient not taking: Reported on 06/24/2022)   [DISCONTINUED] neomycin-polymyxin-hydrocortisone (CORTISPORIN) OTIC solution Place 4 drops into the left ear 3 (three) times daily. X4-7 days, ciprodex too expensive (Patient not taking: Reported on 06/24/2022)   [DISCONTINUED] Vibegron (GEMTESA) 75 MG TABS Take 75 mg by mouth daily.   No facility-administered encounter medications on file as of 09/19/2022.     Lab Results  Component Value Date   WBC 6.5 08/02/2022   HGB 14.3 08/02/2022   HCT 44.8 08/02/2022   PLT 299 08/02/2022   GLUCOSE 98 09/17/2022   CHOL 184 09/17/2022   TRIG 175.0 (H) 09/17/2022   HDL 43.70 09/17/2022   LDLDIRECT 110.0 02/11/2017   LDLCALC 105 (H) 09/17/2022   ALT 12 09/17/2022   AST 18 09/17/2022   NA 137 09/17/2022   K 4.2 09/17/2022   CL 101 09/17/2022   CREATININE 0.69 09/17/2022   BUN 15 09/17/2022   CO2  30 09/17/2022   TSH 4.74 09/17/2022   HGBA1C 5.4 07/15/2017    CT Head Wo Contrast  Result Date: 09/04/2022 CLINICAL DATA:  Trauma. EXAM: CT HEAD WITHOUT CONTRAST TECHNIQUE: Contiguous axial images were obtained from the base of the skull through the vertex without intravenous contrast. RADIATION DOSE REDUCTION: This exam was performed according to the departmental dose-optimization program which includes automated exposure control,  adjustment of the mA and/or kV according to patient size and/or use of iterative reconstruction technique. COMPARISON:  Head CT dated 12/26/2015. FINDINGS: Brain: The ventricles and sulci are appropriate size for the patient's age. The gray-white matter discrimination is preserved. There is no acute intracranial hemorrhage. No mass effect or midline shift. No extra-axial fluid collection. Vascular: No hyperdense vessel or unexpected calcification. Skull: Normal. Negative for fracture or focal lesion. Sinuses/Orbits: No acute finding. Other: None IMPRESSION: No acute intracranial pathology. Electronically Signed   By: Anner Crete M.D.   On: 09/04/2022 18:36   DG Wrist Complete Left  Result Date: 09/04/2022 CLINICAL DATA:  Fall with pain. EXAM: LEFT WRIST - COMPLETE 3+ VIEW COMPARISON:  Left forearm radiographs dated 09/26/2021. FINDINGS: There is no evidence of fracture or dislocation. There is moderate osteoarthritis at the first carpometacarpal joint. Soft tissues are unremarkable. IMPRESSION: 1. No acute osseous injury. Electronically Signed   By: Zerita Boers M.D.   On: 09/04/2022 18:05   DG Elbow Complete Right  Result Date: 09/04/2022 CLINICAL DATA:  Fall and trauma to the right upper extremity. EXAM: RIGHT ELBOW - COMPLETE 3+ VIEW; RIGHT WRIST - COMPLETE 3+ VIEW COMPARISON:  None Available. FINDINGS: No acute fracture or dislocation. The bones are osteopenic. The soft tissues are unremarkable. IMPRESSION: No acute fracture or dislocation. Electronically Signed   By: Anner Crete M.D.   On: 09/04/2022 17:59   DG Wrist Complete Right  Result Date: 09/04/2022 CLINICAL DATA:  Fall and trauma to the right upper extremity. EXAM: RIGHT ELBOW - COMPLETE 3+ VIEW; RIGHT WRIST - COMPLETE 3+ VIEW COMPARISON:  None Available. FINDINGS: No acute fracture or dislocation. The bones are osteopenic. The soft tissues are unremarkable. IMPRESSION: No acute fracture or dislocation. Electronically Signed   By:  Anner Crete M.D.   On: 09/04/2022 17:59       Assessment & Plan:  Generalized abdominal pain Assessment & Plan: Recently evaluated for abdominal pain/epigastric pain as outlined.  CT 07/25/22 - No evidence of intestinal obstruction or pneumoperitoneum. No hydronephrosis. Appendix is not dilated. Diverticulosis of colon without signs of focal acute diverticulitis. Small hiatal hernia. Aortic arteriosclerosis. On questioning her now, she is eating, but is blending most all of her food.  Describes feeling that bowels are sluggish.  Describes pills getting stuck.  Discussed benefiber.  She is drinking warm apple juice.  Needs GI evaluation - EGD/colonoscopy.  Will continue with eating slowly and small bites or blending foot.  Appt with GI for further evaluation.    Orders: -     Ambulatory referral to Gastroenterology  Mild intermittent asthma without complication Assessment & Plan: Breathing overall appears to be stable.  Lungs clear.     Calculus of gallbladder without cholecystitis without obstruction Assessment & Plan: Has known gallstones.  CT as outlined with recent symptoms.  Follow. Refer to GI as outlined.    Gastroesophageal reflux disease, unspecified whether esophagitis present Assessment & Plan: Acid reflux - nexium as directed.    History of colonic polyps Assessment & Plan: Colonoscopy 08/2017 - tubular adenoma.  Recommended  f/u colonoscopy in 5 years. Due for colonoscopy.  With symptoms and due screening, refer back to GI as outlined.    Orders: -     Ambulatory referral to Gastroenterology  Hypercholesterolemia Assessment & Plan: Has been off crestor.  Low cholesterol diet and exercise.  Follow lipid panel and liver function tests.    Other orders -     Esomeprazole Magnesium; Take 1 capsule (40 mg total) by mouth daily.  Dispense: 30 capsule; Refill: 2     Einar Pheasant, MD

## 2022-09-19 NOTE — Patient Instructions (Signed)
Marching 2 min with higher knees and foot to minimize falls   Deep core level 1-2 ( 2 x day

## 2022-09-26 ENCOUNTER — Ambulatory Visit: Payer: PPO | Admitting: Physical Therapy

## 2022-09-26 DIAGNOSIS — R2689 Other abnormalities of gait and mobility: Secondary | ICD-10-CM

## 2022-09-26 DIAGNOSIS — R278 Other lack of coordination: Secondary | ICD-10-CM

## 2022-09-26 NOTE — Therapy (Signed)
OUTPATIENT PHYSICAL THERAPY TREATMENT    Patient Name: Lynn Mann MRN: [id number] DOB:[birth year], 72 y.o., female Today's Date: 09/19/2022   PT End of Session - 09/05/22 1146     Visit Number 4    Number of Visits 10    Date for PT Re-Evaluation 10/17/22    Authorization Type eval 08/08/22    PT Start Time [id number]    PT Stop Time 1505    PT Time Calculation (min) 50 min    Activity Tolerance Patient tolerated treatment well;No increased pain    Behavior During Therapy WFL for tasks assessed/performed               Past Medical History:  Diagnosis Date   Arthritis    C. difficile diarrhea    age 33s-40s    Chicken pox    Cholecystitis 11/2011   Did not require sgy - Dr. Staci Acosta - [facility]  (cholelithiasis)   H/O Clostridium difficile infection    IBS (irritable bowel syndrome)    MRSA exposure 2005   Spider bite   MVP (mitral valve prolapse)    Stable - Dr. Ubaldo Glassing   Rheumatic fever    Past Surgical History:  Procedure Laterality Date   CATARACT EXTRACTION  [id number]   MUSCLE BIOPSY     Patient Active Problem List   Diagnosis Date Noted   Estrogen deficiency 06/23/2022   Nocturia 03/12/2022   Snoring 03/12/2022   Muscle weakness 03/11/2022   Joint ache 02/11/2022   Ankle pain 02/05/2022   Fever 12/26/2021   Abdominal pain 12/26/2021   Cough 09/07/2021   Right shoulder pain 07/15/2021   Hematoma 02/11/2021   Ankle swelling 11/26/2020   Low back pain 11/22/2020   Hip pain, right 11/22/2020   Light headedness 06/24/2020   Hearing loss 06/24/2020   Binocular vision disorder with diplopia 06/24/2020   Stress 05/17/2020   Chest pain 05/16/2020   Welcome to Medicare preventive visit 06/20/2019   Viral syndrome 01/30/2019   Itching 07/21/2017   Asthma 07/16/2016   Urinary incontinence 07/16/2016   SOB (shortness of breath) 04/01/2016   Bilateral shoulder pain 02/24/2016   Fatigue 01/28/2016   Neck fullness 01/28/2016   Routine general medical examination  at a health care facility 07/25/2015   Health care maintenance 10/09/2014   Neck pain 11/26/2013   Hypercholesterolemia 11/26/2013   History of colonic polyps 05/11/2013   Hyperbilirubinemia 01/18/2013   GERD (gastroesophageal reflux disease) 12/03/2012   Cholelithiasis 11/07/2012   IBS (irritable bowel syndrome) 11/07/2012   History of rheumatic fever 08/28/2012   MVP (mitral valve prolapse) 08/28/2012    PCP: Nicki Reaper   REFERRING PROVIDER: Nicki Reaper   REFERRING DIAG:  Diagnosis  R32 (ICD-10-CM) - Urinary incontinence, unspecified type   Rationale for Evaluation and Treatment Rehabilitation  THERAPY DIAG:  Other abnormalities of gait and mobility  Other lack of coordination  ONSET DATE:   SUBJECTIVE:                                 SUBJECTIVE TODAY :  Pt has questions about the deep core exercises   SUBJECTIVE STATEMENT ON EVAL 08/09/22 :  Pt is interested in having Pelvic PT assess her new Dx of umbilical hernia and strengthen her abdominal muscles and more control of her incontinence. The incontinence got better after she got COVID and then she noticed it was a problem again.   Pt is  due sleep study. When she got COvid and was on liquid diet , she was only getting up to urinate 2 x night compared to 4-5 x night. Now she is recovered, she is going 3-4 x night.   Pt is due for a colonoscopy  but has not made an appt yet.   1) low abdominal pain L and R, 2-7 /10 pain which occurs after she eats. It has been better since she has been blending things. There is discomfort when she is eating too much.   2) urinary leakage: Pt reports her leakage is better and not longer flooding since her last pelvic PT sessions. Pt kept with the exercises for 10 days.   Pt is due for a cystoscopy and is delaying that because her abdominal pain from her gastroenteritis.  Pt changes 1-2 depends per day. The last couple of days it has been more but not as many as before.   Pt wants to go the gym  and do water walking.   PERTINENT HISTORY:  See above   PAIN:  Are you having pain? No just discomfort at the B low abdominal area   WEIGHT BEARING RESTRICTIONS: No    FALLS:  Has patient fallen in last 6 months? No  LIVING ENVIRONMENT: Lives with: lives alone Lives in: House/apartment Stairs: no  Has following equipment at home: None  OCCUPATION: retired, widow   PLOF: Independent  PATIENT GOALS:   strengthen her abdominal muscles and more control of her incontinence.    OBJECTIVE:    [facility] PT Assessment - 09/19/22 1429       Coordination   Coordination and Movement Description ab overuse with deep core HEP      Other:   Other/Comments standing marches 2 min, with rapid breathing, sweating, report of 4-5/10 level of fatigue      Palpation   SI assessment  levelled shoulder and pelvic girdle    Palpation comment L SIJ mobile ( related to pt's c/o tender at the tailbone like a knot)      Transfers   Comments sit to stand with\ proper alignment without cues             [facility] Adult PT Treatment/Exercise - 09/19/22 1429       Therapeutic Activites    Other Therapeutic Activities discussed higher knees with wlaking to ensure not stub toe nor shoe on inclines / uneven surfaces to minimzie fall risk      Neuro Re-ed    Neuro Re-ed Details  cued for standing marching 2 min with higher knees, cued for less ab overuse  in deep core      Manual Therapy   Manual therapy comments STM/MWM at problem areas noted in assessment to promote R SIJ mobility               HOME EXERCISE PROGRAM: See pt instruction section    ASSESSMENT:  CLINICAL IMPRESSION:  Progressed to quick contractions of  kegel strengthening but pt required excessive cues for correct technique along with deep core HEP technique. Pt demo'd correctly post Tx. Plan to review at next session    Pt benefits from skilled PT.    OBJECTIVE IMPAIRMENTS decreased activity tolerance, decreased  coordination, decreased endurance, decreased mobility, difficulty walking, decreased ROM, decreased strength, decreased safety awareness, hypomobility, increased muscle spasms, impaired flexibility, improper body mechanics, postural dysfunction, and pain. scar restrictions   ACTIVITY LIMITATIONS  self-care,  sleep, home chores, work tasks , fitness  PARTICIPATION LIMITATIONS:  community, gym activities    PERSONAL FACTORS   medical workup for GI and recovery from COVID    are also affecting patient's functional outcome.    REHAB POTENTIAL: Good   CLINICAL DECISION MAKING: Evolving/moderate complexity   EVALUATION COMPLEXITY: Moderate    PATIENT EDUCATION:    Education details: Showed pt anatomy images. Explained muscles attachments/ connection, physiology of deep core system/ spinal- thoracic-pelvis-lower kinetic chain as they relate to pt's presentation, Sx, and past Hx. Explained what and how these areas of deficits need to be restored to balance and function    See Therapeutic activity / neuromuscular re-education section  Answered pt's questions.   Person educated: Patient Education method: Explanation, Demonstration, Tactile cues, Verbal cues, and Handouts Education comprehension: verbalized understanding, returned demonstration, verbal cues required, tactile cues required, and needs further education     PLAN: PT FREQUENCY: 1x/week   PT DURATION: 10 weeks   PLANNED INTERVENTIONS: Therapeutic exercises, Therapeutic activity, Neuromuscular re-education, Balance training, Gait training, Patient/Family education, Self Care, Joint mobilization, Spinal mobilization, Moist heat, Taping, and Manual therapy, dry needling.   PLAN FOR NEXT SESSION: See clinical impression for plan     GOALS: Goals reviewed with patient? Yes  SHORT TERM GOALS: Target date: 09/05/2022    Pt will demo IND with HEP                    Baseline: Not IND            Goal status: INITIAL   LONG  TERM GOALS: Target date: 10/17/2022    1.Pt will demo proper deep core coordination without chest breathing and optimal excursion of diaphragm/pelvic floor in order to promote spinal stability and pelvic floor function  Baseline: dyscoordination Goal status: INITIAL  2.  Pt will demo > 5 pt change on FOTO  to improve QOL and function   Pelvic Pain baseline - PFDI Urinary baseline - Bowel  constipation baseline - Bowel Leakage baseline - Urinary Problem baseline- PFDI Bowel -    Goal status: INITIAL  3.  Pt will demo proper body mechanics in against gravity tasks and ADLs  work tasks, fitness  to minimize straining pelvic floor / back                  Baseline: not IND, improper form that places strain on pelvic floor                Goal status: INITIAL    4. Pt will demo levelled pelvic girdle and shoulder height in order to progress to deep core strengthening HEP and restore mobility at spine, pelvis, gait, posture   Baseline: L shoulder and iliac crest higher  Goal status: INITIAL    5. Pt will increase hip abd B > 4/5 in order to improve pelvic girdle stability and minimize urinary leakage  Baseline: R hip abd 2/5, L 3-/5,  Goal status: INITIAL      Jerl Mina, PT 09/19/2022, 2:27 PM

## 2022-09-26 NOTE — Patient Instructions (Signed)
5 quick squeezes   Deep core level 1 and 2    5 quick squeezes

## 2022-09-29 ENCOUNTER — Encounter: Payer: Self-pay | Admitting: Internal Medicine

## 2022-09-29 NOTE — Assessment & Plan Note (Signed)
Colonoscopy 08/2017 - tubular adenoma.  Recommended f/u colonoscopy in 5 years. Due for colonoscopy.  With symptoms and due screening, refer back to GI as outlined.

## 2022-09-29 NOTE — Assessment & Plan Note (Signed)
Has known gallstones.  CT as outlined with recent symptoms.  Follow. Refer to GI as outlined.

## 2022-09-29 NOTE — Assessment & Plan Note (Signed)
Recently evaluated for abdominal pain/epigastric pain as outlined.  CT 07/25/22 - No evidence of intestinal obstruction or pneumoperitoneum. No hydronephrosis. Appendix is not dilated. Diverticulosis of colon without signs of focal acute diverticulitis. Small hiatal hernia. Aortic arteriosclerosis. On questioning her now, she is eating, but is blending most all of her food.  Describes feeling that bowels are sluggish.  Describes pills getting stuck.  Discussed benefiber.  She is drinking warm apple juice.  Needs GI evaluation - EGD/colonoscopy.  Will continue with eating slowly and small bites or blending foot.  Appt with GI for further evaluation.

## 2022-09-29 NOTE — Assessment & Plan Note (Signed)
Breathing overall appears to be stable.  Lungs clear.

## 2022-09-29 NOTE — Assessment & Plan Note (Signed)
Acid reflux - nexium as directed.

## 2022-09-29 NOTE — Assessment & Plan Note (Addendum)
Has been off crestor.  Low cholesterol diet and exercise.  Follow lipid panel and liver function tests.

## 2022-10-01 ENCOUNTER — Ambulatory Visit: Payer: PPO | Attending: Internal Medicine | Admitting: Physical Therapy

## 2022-10-01 DIAGNOSIS — R2681 Unsteadiness on feet: Secondary | ICD-10-CM | POA: Insufficient documentation

## 2022-10-01 DIAGNOSIS — M5459 Other low back pain: Secondary | ICD-10-CM | POA: Diagnosis not present

## 2022-10-01 DIAGNOSIS — M6281 Muscle weakness (generalized): Secondary | ICD-10-CM | POA: Insufficient documentation

## 2022-10-01 DIAGNOSIS — R278 Other lack of coordination: Secondary | ICD-10-CM | POA: Insufficient documentation

## 2022-10-01 DIAGNOSIS — R2689 Other abnormalities of gait and mobility: Secondary | ICD-10-CM | POA: Diagnosis not present

## 2022-10-01 DIAGNOSIS — R42 Dizziness and giddiness: Secondary | ICD-10-CM | POA: Diagnosis not present

## 2022-10-01 NOTE — Patient Instructions (Signed)
PELVIC FLOOR / KEGEL EXERCISES   Pelvic floor/ Kegel exercises are used to strengthen the muscles in the base of your pelvis that are responsible for supporting your pelvic organs and preventing urine/feces leakage. Based on your therapist's recommendations, they can be performed while standing, sitting, or lying down. Imagine pelvic floor area as a diamond with pelvic landmarks: top =pubic bone, bottom tip=tailbone, sides=sitting bones (ischial tuberosities).    Make yourself aware of this muscle group by using these cues while coordinating your breath: Inhale, feel pelvic floor diamond area lower like hammock towards your feet and ribcage/belly expanding. Pause. Let the exhale naturally and feel your belly sink, abdominal muscles hugging in around you and you may notice the pelvic diamond draws upward towards your head forming a umbrella shape. Give a squeeze during the exhalation like you are stopping the flow of urine. If you are squeezing the buttock muscles, try to give 50% less effort.   Make yourself aware of this muscle group by using these cues: Blueberry imagery Telescope imagery Arcade machine plush animal suction lift  Jelly fish lift   Focus on the perineum, the center of the pelvic floor muscles to engage the whole set of muscles , lowering like a bowl on inhale as ribs expand like tree rings, on exhale, pelvic at the perineum gently lifts , low belly sinks, upper belly sinks.   Common Errors: Breath holding: If you are holding your breath, you may be bearing down against your bladder instead of pulling it up. If you belly bulges up while you are squeezing, you are holding your breath. Be sure to breathe gently in and out while exercising. Counting out loud may help you avoid holding your breath. Accessory muscle use: You should not see or feel other muscle movement when performing pelvic floor exercises. When done properly, no one can tell that you are performing the exercises.  Keep the buttocks, belly and inner thighs relaxed. Overdoing it: Your muscles can fatigue and stop working for you if you over-exercise. You may actually leak more or feel soreness at the lower abdomen or rectum.  YOUR HOME EXERCISE PROGRAM   SHORT HOLDS: Position: on back WITH PILLOWS horizontal beneath a PILLOW placed LONG WAYS ONTOP. PILLOW IS at Midback   Inhale and then exhale. Then squeeze the muscle.  (Be sure to let belly sink in with exhales and not push outward) Perform 4 repetitions before deep core level 1 and 2 and again after In morning and evening ( total of 4 sets)                       DECREASE DOWNWARD PRESSURE ON  YOUR PELVIC FLOOR, ABDOMINAL, LOW BACK MUSCLES       PRESERVE YOUR PELVIC HEALTH LONG-TERM   ** SQUEEZE pelvic floor BEFORE YOUR SNEEZE, COUGH, LAUGH   ** EXHALE BEFORE YOU RISE AGAINST GRAVITY (lifting, sit to stand, from squat to stand)   ** LOG ROLL OUT OF BED INSTEAD OF CRUNCH/SIT-UP

## 2022-10-01 NOTE — Therapy (Signed)
OUTPATIENT PHYSICAL THERAPY TREATMENT    Patient Name: Lynn Mann MRN: [id number] DOB:1951-03-19, 72 y.o., female Today's Date: 10/01/2022   PT End of Session - 09/05/22 1146     Visit Number 4    Number of Visits 10    Date for PT Re-Evaluation 10/17/22    Authorization Type eval 08/08/22    PT Start Time [id number]    PT Stop Time 1505    PT Time Calculation (min) 50 min    Activity Tolerance Patient tolerated treatment well;No increased pain    Behavior During Therapy WFL for tasks assessed/performed               Past Medical History:  Diagnosis Date   Arthritis    C. difficile diarrhea    age 8s-40s    Chicken pox    Cholecystitis 11/2011   Did not require sgy - Dr. Staci Acosta - [facility]  (cholelithiasis)   H/O Clostridium difficile infection    IBS (irritable bowel syndrome)    MRSA exposure 2005   Spider bite   MVP (mitral valve prolapse)    Stable - Dr. Ubaldo Glassing   Rheumatic fever    Past Surgical History:  Procedure Laterality Date   CATARACT EXTRACTION  [id number]   MUSCLE BIOPSY     Patient Active Problem List   Diagnosis Date Noted   Estrogen deficiency 06/23/2022   Nocturia 03/12/2022   Snoring 03/12/2022   Muscle weakness 03/11/2022   Joint ache 02/11/2022   Ankle pain 02/05/2022   Fever 12/26/2021   Abdominal pain 12/26/2021   Cough 09/07/2021   Right shoulder pain 07/15/2021   Hematoma 02/11/2021   Ankle swelling 11/26/2020   Low back pain 11/22/2020   Hip pain, right 11/22/2020   Light headedness 06/24/2020   Hearing loss 06/24/2020   Binocular vision disorder with diplopia 06/24/2020   Stress 05/17/2020   Chest pain 05/16/2020   Welcome to Medicare preventive visit 06/20/2019   Viral syndrome 01/30/2019   Itching 07/21/2017   Asthma 07/16/2016   Urinary incontinence 07/16/2016   SOB (shortness of breath) 04/01/2016   Bilateral shoulder pain 02/24/2016   Fatigue 01/28/2016   Neck fullness 01/28/2016   Routine general medical examination  at a health care facility 07/25/2015   Health care maintenance 10/09/2014   Neck pain 11/26/2013   Hypercholesterolemia 11/26/2013   History of colonic polyps 05/11/2013   Hyperbilirubinemia 01/18/2013   GERD (gastroesophageal reflux disease) 12/03/2012   Cholelithiasis 11/07/2012   IBS (irritable bowel syndrome) 11/07/2012   History of rheumatic fever 08/28/2012   MVP (mitral valve prolapse) 08/28/2012    PCP: Nicki Reaper   REFERRING PROVIDER: Nicki Reaper   REFERRING DIAG:  Diagnosis  R32 (ICD-10-CM) - Urinary incontinence, unspecified type   Rationale for Evaluation and Treatment Rehabilitation  THERAPY DIAG:  Other abnormalities of gait and mobility  Other lack of coordination  ONSET DATE:   SUBJECTIVE:                                 SUBJECTIVE TODAY :  Pt  practiced deep core   SUBJECTIVE STATEMENT ON EVAL 08/09/22 :  Pt is interested in having Pelvic PT assess her new Dx of umbilical hernia and strengthen her abdominal muscles and more control of her incontinence. The incontinence got better after she got COVID and then she noticed it was a problem again.   Pt is due sleep study.  When she got COvid and was on liquid diet , she was only getting up to urinate 2 x night compared to 4-5 x night. Now she is recovered, she is going 3-4 x night.   Pt is due for a colonoscopy  but has not made an appt yet.   1) low abdominal pain L and R, 2-7 /10 pain which occurs after she eats. It has been better since she has been blending things. There is discomfort when she is eating too much.   2) urinary leakage: Pt reports her leakage is better and not longer flooding since her last pelvic PT sessions. Pt kept with the exercises for 10 days.   Pt is due for a cystoscopy and is delaying that because her abdominal pain from her gastroenteritis.  Pt changes 1-2 depends per day. The last couple of days it has been more but not as many as before.   Pt wants to go the gym and do water walking.    PERTINENT HISTORY:  See above   PAIN:  Are you having pain? No just discomfort at the B low abdominal area   WEIGHT BEARING RESTRICTIONS: No    FALLS:  Has patient fallen in last 6 months? No  LIVING ENVIRONMENT: Lives with: lives alone Lives in: House/apartment Stairs: no  Has following equipment at home: None  OCCUPATION: retired, widow   PLOF: Independent  PATIENT GOALS:   strengthen her abdominal muscles and more control of her incontinence.    OBJECTIVE:     [facility] PT Assessment - 10/01/22 1435       Coordination   Coordination and Movement Description cued for less ab overuse             Pelvic Floor Special Questions - 10/01/22 1456     External Perineal Exam 4 reps in hooklying             [facility] Adult PT Treatment/Exercise - 10/01/22 1455       Therapeutic Activites    Other Therapeutic Activities modified to semi reclined positoin to help pt get breath out given cardiac Hx .  Pt responded better to music and was able to get her breath out more easily after listening to music and in semi reclined position.      Neuro Re-ed    Neuro Re-ed Details  cued for less ab overuse, cued for quick pelvic floor contractions 4 reps                   HOME EXERCISE PROGRAM: See pt instruction section    ASSESSMENT:  CLINICAL IMPRESSION:  Reviewed quick contractions of  kegel strengthening with no cues for proper form. Pt achieved 4 reps.   Pt required less  cues for correct technique along with deep core HEP technique.    Pt responded better to music and was able to get her breath out more easily after listening to music and in semi reclined position.    Pt benefits from skilled PT.    OBJECTIVE IMPAIRMENTS decreased activity tolerance, decreased coordination, decreased endurance, decreased mobility, difficulty walking, decreased ROM, decreased strength, decreased safety awareness, hypomobility, increased muscle spasms, impaired  flexibility, improper body mechanics, postural dysfunction, and pain. scar restrictions   ACTIVITY LIMITATIONS  self-care,  sleep, home chores, work tasks , fitness   PARTICIPATION LIMITATIONS:  community, gym activities    [place] workup for GI and recovery from [place]    are also affecting  patient's functional outcome.    REHAB POTENTIAL: Good   CLINICAL DECISION MAKING: Evolving/moderate complexity   EVALUATION COMPLEXITY: Moderate    PATIENT EDUCATION:    Education details: Showed pt anatomy images. Explained muscles attachments/ connection, physiology of deep core system/ spinal- thoracic-pelvis-lower kinetic chain as they relate to pt's presentation, Sx, and past Hx. Explained what and how these areas of deficits need to be restored to balance and function    See Therapeutic activity / neuromuscular re-education section  Answered pt's questions.   Person educated: Patient Education method: Explanation, Demonstration, Tactile cues, Verbal cues, and Handouts Education comprehension: verbalized understanding, returned demonstration, verbal cues required, tactile cues required, and needs further education     PLAN: PT FREQUENCY: 1x/week   PT DURATION: 10 weeks   PLANNED INTERVENTIONS: Therapeutic exercises, Therapeutic activity, Neuromuscular re-education, Balance training, Gait training, Patient/Family education, Self Care, Joint mobilization, Spinal mobilization, Moist heat, Taping, and Manual therapy, dry needling.   PLAN FOR NEXT SESSION: See clinical impression for plan     GOALS: Goals reviewed with patient? Yes  SHORT TERM GOALS: Target date: 09/05/2022    Pt will demo IND with HEP                    Baseline: Not IND            Goal status: INITIAL   LONG TERM GOALS: Target date: 10/17/2022    1.Pt will demo proper deep core coordination without chest breathing and optimal excursion of diaphragm/pelvic floor in order to promote spinal  stability and pelvic floor function  Baseline: dyscoordination Goal status: INITIAL  2.  Pt will demo > 5 pt change on FOTO  to improve QOL and function   Pelvic Pain baseline - PFDI Urinary baseline - Bowel  constipation baseline - Bowel Leakage baseline - Urinary Problem baseline- PFDI Bowel -    Goal status: INITIAL  3.  Pt will demo proper body mechanics in against gravity tasks and ADLs  work tasks, fitness  to minimize straining pelvic floor / back                  Baseline: not IND, improper form that places strain on pelvic floor                Goal status: INITIAL    4. Pt will demo levelled pelvic girdle and shoulder height in order to progress to deep core strengthening HEP and restore mobility at spine, pelvis, gait, posture   Baseline: L shoulder and iliac crest higher  Goal status: INITIAL    5. Pt will increase hip abd B > 4/5 in order to improve pelvic girdle stability and minimize urinary leakage  Baseline: R hip abd 2/5, L 3-/5,  Goal status: INITIAL      Jerl Mina, PT 10/01/2022, 2:26 PM

## 2022-10-07 DIAGNOSIS — K219 Gastro-esophageal reflux disease without esophagitis: Secondary | ICD-10-CM | POA: Diagnosis not present

## 2022-10-07 DIAGNOSIS — R1032 Left lower quadrant pain: Secondary | ICD-10-CM | POA: Diagnosis not present

## 2022-10-07 DIAGNOSIS — R1031 Right lower quadrant pain: Secondary | ICD-10-CM | POA: Diagnosis not present

## 2022-10-07 DIAGNOSIS — R1310 Dysphagia, unspecified: Secondary | ICD-10-CM | POA: Diagnosis not present

## 2022-10-08 ENCOUNTER — Telehealth: Payer: Self-pay | Admitting: Family Medicine

## 2022-10-08 DIAGNOSIS — I341 Nonrheumatic mitral (valve) prolapse: Secondary | ICD-10-CM | POA: Diagnosis not present

## 2022-10-08 DIAGNOSIS — E7800 Pure hypercholesterolemia, unspecified: Secondary | ICD-10-CM | POA: Diagnosis not present

## 2022-10-08 DIAGNOSIS — R079 Chest pain, unspecified: Secondary | ICD-10-CM | POA: Diagnosis not present

## 2022-10-08 DIAGNOSIS — R0602 Shortness of breath: Secondary | ICD-10-CM | POA: Diagnosis not present

## 2022-10-08 NOTE — Telephone Encounter (Signed)
Patient called and states she was scheduled for a cysto and she had cancelled the appointment because she is having a Endoscopy and colonoscopy. She was afraid the cysto will interfere with other procedures. I informed her it will not but I went ahead and scheduled her to come in after those procedures a done.

## 2022-10-09 ENCOUNTER — Ambulatory Visit: Payer: PPO | Admitting: Physical Therapy

## 2022-10-09 VITALS — BP 115/78

## 2022-10-09 DIAGNOSIS — R42 Dizziness and giddiness: Secondary | ICD-10-CM

## 2022-10-09 DIAGNOSIS — R2681 Unsteadiness on feet: Secondary | ICD-10-CM

## 2022-10-09 DIAGNOSIS — R278 Other lack of coordination: Secondary | ICD-10-CM

## 2022-10-09 DIAGNOSIS — R2689 Other abnormalities of gait and mobility: Secondary | ICD-10-CM | POA: Diagnosis not present

## 2022-10-09 DIAGNOSIS — M5459 Other low back pain: Secondary | ICD-10-CM

## 2022-10-09 DIAGNOSIS — M6281 Muscle weakness (generalized): Secondary | ICD-10-CM

## 2022-10-09 NOTE — Therapy (Signed)
OUTPATIENT PHYSICAL THERAPY TREATMENT    Patient Name: Lynn Mann MRN: [id number] DOB:[birth year], 72 y.o., female Today's Date: 10/09/2022    PT End of Session - 10/09/22 1354     Visit Number 8    Number of Visits 10    Date for PT Re-Evaluation 10/17/22    Authorization Type eval 08/08/22    PT Start Time [id number]    PT Stop Time 1415    PT Time Calculation (min) 30 min    Activity Tolerance Patient tolerated treatment well;No increased pain    Behavior During Therapy WFL for tasks assessed/performed              Past Medical History:  Diagnosis Date   Arthritis    C. difficile diarrhea    age 71s-40s    Chicken pox    Cholecystitis 11/2011   Did not require sgy - Dr. Staci Acosta - [facility]  (cholelithiasis)   H/O Clostridium difficile infection    IBS (irritable bowel syndrome)    MRSA exposure 2005   Spider bite   MVP (mitral valve prolapse)    Stable - Dr. Ubaldo Glassing   Rheumatic fever    Past Surgical History:  Procedure Laterality Date   CATARACT EXTRACTION  [id number]   MUSCLE BIOPSY     Patient Active Problem List   Diagnosis Date Noted   Estrogen deficiency 06/23/2022   Nocturia 03/12/2022   Snoring 03/12/2022   Muscle weakness 03/11/2022   Joint ache 02/11/2022   Ankle pain 02/05/2022   Fever 12/26/2021   Abdominal pain 12/26/2021   Cough 09/07/2021   Right shoulder pain 07/15/2021   Hematoma 02/11/2021   Ankle swelling 11/26/2020   Low back pain 11/22/2020   Hip pain, right 11/22/2020   Light headedness 06/24/2020   Hearing loss 06/24/2020   Binocular vision disorder with diplopia 06/24/2020   Stress 05/17/2020   Chest pain 05/16/2020   Welcome to Medicare preventive visit 06/20/2019   Viral syndrome 01/30/2019   Itching 07/21/2017   Asthma 07/16/2016   Urinary incontinence 07/16/2016   SOB (shortness of breath) 04/01/2016   Bilateral shoulder pain 02/24/2016   Fatigue 01/28/2016   Neck fullness 01/28/2016   Routine general medical examination  at a health care facility 07/25/2015   Health care maintenance 10/09/2014   Neck pain 11/26/2013   Hypercholesterolemia 11/26/2013   History of colonic polyps 05/11/2013   Hyperbilirubinemia 01/18/2013   GERD (gastroesophageal reflux disease) 12/03/2012   Cholelithiasis 11/07/2012   IBS (irritable bowel syndrome) 11/07/2012   History of rheumatic fever 08/28/2012   MVP (mitral valve prolapse) 08/28/2012    PCP: Nicki Reaper   REFERRING PROVIDER: Nicki Reaper   REFERRING DIAG:  Diagnosis  R32 (ICD-10-CM) - Urinary incontinence, unspecified type   Rationale for Evaluation and Treatment Rehabilitation  THERAPY DIAG:  Other abnormalities of gait and mobility  Other lack of coordination  Muscle weakness (generalized)  Unsteadiness on feet  Other low back pain  Dizziness and giddiness  ONSET DATE:   SUBJECTIVE:                                 SUBJECTIVE TODAY :  Pt has been feeling really tired yesterday and past few days.  Pt is still liquidifying everything.   SUBJECTIVE STATEMENT ON EVAL 08/09/22 :  Pt is interested in having Pelvic PT assess her new Dx of umbilical hernia and strengthen her abdominal muscles and  more control of her incontinence. The incontinence got better after she got COVID and then she noticed it was a problem again.   Pt is due sleep study. When she got COvid and was on liquid diet , she was only getting up to urinate 2 x night compared to 4-5 x night. Now she is recovered, she is going 3-4 x night.   Pt is due for a colonoscopy  but has not made an appt yet.   1) low abdominal pain L and R, 2-7 /10 pain which occurs after she eats. It has been better since she has been blending things. There is discomfort when she is eating too much.   2) urinary leakage: Pt reports her leakage is better and not longer flooding since her last pelvic PT sessions. Pt kept with the exercises for 10 days.   Pt is due for a cystoscopy and is delaying that because her abdominal  pain from her gastroenteritis.  Pt changes 1-2 depends per day. The last couple of days it has been more but not as many as before.   Pt wants to go the gym and do water walking.   PERTINENT HISTORY:  See above   PAIN:  Are you having pain? No just discomfort at the B low abdominal area   WEIGHT BEARING RESTRICTIONS: No    FALLS:  Has patient fallen in last 6 months? No  LIVING ENVIRONMENT: Lives with: lives alone Lives in: House/apartment Stairs: no  Has following equipment at home: None  OCCUPATION: retired, widow   PLOF: Independent  PATIENT GOALS:   strengthen her abdominal muscles and more control of her incontinence.    OBJECTIVE:   BP monitored 115 78 mm Hg     [facility] PT Assessment - 10/09/22 1737       Coordination   Coordination and Movement Description proper technique with minor cues deep core 1-2, but stopped at 3 min of 6 min deep core level 2 because pt report she experienced fecal leakage which has been occuring the past few days             [facility] Adult PT Treatment/Exercise - 10/09/22 1737       Therapeutic Activites    Other Therapeutic Activities discussed completing her food diary to be ready to share with nutritionist / dietitian to minimize fecal leakage/ transition to solid foods and wean off liquid diet      Neuro Re-ed    Neuro Re-ed Details  reviewed deep core level 1-2 with pelvic floor contractions                   HOME EXERCISE PROGRAM: See pt instruction section    ASSESSMENT:  CLINICAL IMPRESSION:  Discussed completing her food diary to be ready to share with nutritionist / dietitian to minimize fecal leakage/ transition to solid foods and wean off liquid diet.  Discussed f/u with providers re: her Sx of fatigue these past days.  Pt required less cues for pelvic floor contraction and deep core HEP. Plan to progress with kegels next session with endurance contractions.  Pt benefits from skilled PT.     OBJECTIVE IMPAIRMENTS decreased activity tolerance, decreased coordination, decreased endurance, decreased mobility, difficulty walking, decreased ROM, decreased strength, decreased safety awareness, hypomobility, increased muscle spasms, impaired flexibility, improper body mechanics, postural dysfunction, and pain. scar restrictions   ACTIVITY LIMITATIONS  self-care,  sleep, home chores, work tasks , fitness   PARTICIPATION LIMITATIONS:  community, gym activities  PERSONAL FACTORS   medical workup for GI and recovery from COVID    are also affecting patient's functional outcome.    REHAB POTENTIAL: Good   CLINICAL DECISION MAKING: Evolving/moderate complexity   EVALUATION COMPLEXITY: Moderate    PATIENT EDUCATION:    Education details: Showed pt anatomy images. Explained muscles attachments/ connection, physiology of deep core system/ spinal- thoracic-pelvis-lower kinetic chain as they relate to pt's presentation, Sx, and past Hx. Explained what and how these areas of deficits need to be restored to balance and function    See Therapeutic activity / neuromuscular re-education section  Answered pt's questions.   Person educated: Patient Education method: Explanation, Demonstration, Tactile cues, Verbal cues, and Handouts Education comprehension: verbalized understanding, returned demonstration, verbal cues required, tactile cues required, and needs further education     PLAN: PT FREQUENCY: 1x/week   PT DURATION: 10 weeks   PLANNED INTERVENTIONS: Therapeutic exercises, Therapeutic activity, Neuromuscular re-education, Balance training, Gait training, Patient/Family education, Self Care, Joint mobilization, Spinal mobilization, Moist heat, Taping, and Manual therapy, dry needling.   PLAN FOR NEXT SESSION: See clinical impression for plan     GOALS: Goals reviewed with patient? Yes  SHORT TERM GOALS: Target date: 09/05/2022    Pt will demo IND with HEP                     Baseline: Not IND            Goal status: INITIAL   LONG TERM GOALS: Target date: 10/17/2022    1.Pt will demo proper deep core coordination without chest breathing and optimal excursion of diaphragm/pelvic floor in order to promote spinal stability and pelvic floor function  Baseline: dyscoordination Goal status: INITIAL  2.  Pt will demo > 5 pt change on FOTO  to improve QOL and function   Pelvic Pain baseline - PFDI Urinary baseline - Bowel  constipation baseline - Bowel Leakage baseline - Urinary Problem baseline- PFDI Bowel -    Goal status: INITIAL  3.  Pt will demo proper body mechanics in against gravity tasks and ADLs  work tasks, fitness  to minimize straining pelvic floor / back                  Baseline: not IND, improper form that places strain on pelvic floor                Goal status: INITIAL    4. Pt will demo levelled pelvic girdle and shoulder height in order to progress to deep core strengthening HEP and restore mobility at spine, pelvis, gait, posture   Baseline: L shoulder and iliac crest higher  Goal status: INITIAL    5. Pt will increase hip abd B > 4/5 in order to improve pelvic girdle stability and minimize urinary leakage  Baseline: R hip abd 2/5, L 3-/5,  Goal status: INITIAL      Jerl Mina, PT 10/09/2022, 1:55 PM

## 2022-10-17 ENCOUNTER — Other Ambulatory Visit: Payer: Self-pay | Admitting: Gastroenterology

## 2022-10-17 ENCOUNTER — Ambulatory Visit: Payer: PPO

## 2022-10-17 DIAGNOSIS — R1031 Right lower quadrant pain: Secondary | ICD-10-CM | POA: Diagnosis not present

## 2022-10-17 DIAGNOSIS — R1030 Lower abdominal pain, unspecified: Secondary | ICD-10-CM | POA: Diagnosis not present

## 2022-10-17 DIAGNOSIS — K5730 Diverticulosis of large intestine without perforation or abscess without bleeding: Secondary | ICD-10-CM | POA: Diagnosis not present

## 2022-10-17 DIAGNOSIS — K2950 Unspecified chronic gastritis without bleeding: Secondary | ICD-10-CM | POA: Diagnosis not present

## 2022-10-17 DIAGNOSIS — K2100 Gastro-esophageal reflux disease with esophagitis, without bleeding: Secondary | ICD-10-CM | POA: Diagnosis not present

## 2022-10-17 DIAGNOSIS — R1032 Left lower quadrant pain: Secondary | ICD-10-CM | POA: Diagnosis not present

## 2022-10-17 DIAGNOSIS — R1310 Dysphagia, unspecified: Secondary | ICD-10-CM | POA: Diagnosis not present

## 2022-10-17 DIAGNOSIS — K219 Gastro-esophageal reflux disease without esophagitis: Secondary | ICD-10-CM | POA: Diagnosis not present

## 2022-10-17 DIAGNOSIS — K640 First degree hemorrhoids: Secondary | ICD-10-CM | POA: Diagnosis not present

## 2022-10-21 ENCOUNTER — Ambulatory Visit: Payer: PPO

## 2022-10-22 LAB — SURGICAL PATHOLOGY

## 2022-10-23 ENCOUNTER — Ambulatory Visit: Payer: PPO

## 2022-10-23 ENCOUNTER — Ambulatory Visit: Payer: PPO | Admitting: Physical Therapy

## 2022-10-28 ENCOUNTER — Ambulatory Visit: Payer: PPO | Attending: Otolaryngology

## 2022-10-28 DIAGNOSIS — R0683 Snoring: Secondary | ICD-10-CM | POA: Diagnosis not present

## 2022-10-28 DIAGNOSIS — G4733 Obstructive sleep apnea (adult) (pediatric): Secondary | ICD-10-CM | POA: Insufficient documentation

## 2022-10-28 DIAGNOSIS — G4761 Periodic limb movement disorder: Secondary | ICD-10-CM | POA: Diagnosis not present

## 2022-10-29 ENCOUNTER — Ambulatory Visit: Payer: PPO | Attending: Internal Medicine | Admitting: Physical Therapy

## 2022-10-29 DIAGNOSIS — R2689 Other abnormalities of gait and mobility: Secondary | ICD-10-CM | POA: Insufficient documentation

## 2022-10-29 DIAGNOSIS — R278 Other lack of coordination: Secondary | ICD-10-CM | POA: Diagnosis not present

## 2022-10-29 NOTE — Therapy (Signed)
OUTPATIENT PHYSICAL THERAPY TREATMENT  / Discharge Summary across 9 visits    Patient Name: Lynn Mann MRN: [id number] DOB:[birth year], 72 y.o., female Today's Date: 10/29/2022    PT End of Session - 10/29/22 1344     Visit Number 9    Number of Visits 10    Date for PT Re-Evaluation 10/30/22    Authorization Type eval 08/08/22    PT Start Time 1340    PT Stop Time 1418    PT Time Calculation (min) 38 min    Activity Tolerance Patient tolerated treatment well;No increased pain    Behavior During Therapy WFL for tasks assessed/performed              Past Medical History:  Diagnosis Date   Arthritis    C. difficile diarrhea    age 43s-40s    Chicken pox    Cholecystitis 11/2011   Did not require sgy - Dr. Staci Acosta - [facility]  (cholelithiasis)   H/O Clostridium difficile infection    IBS (irritable bowel syndrome)    MRSA exposure 2005   Spider bite   MVP (mitral valve prolapse)    Stable - Dr. Ubaldo Glassing   Rheumatic fever    Past Surgical History:  Procedure Laterality Date   CATARACT EXTRACTION  [id number]   MUSCLE BIOPSY     Patient Active Problem List   Diagnosis Date Noted   Estrogen deficiency 06/23/2022   Nocturia 03/12/2022   Snoring 03/12/2022   Muscle weakness 03/11/2022   Joint ache 02/11/2022   Ankle pain 02/05/2022   Fever 12/26/2021   Abdominal pain 12/26/2021   Cough 09/07/2021   Right shoulder pain 07/15/2021   Hematoma 02/11/2021   Ankle swelling 11/26/2020   Low back pain 11/22/2020   Hip pain, right 11/22/2020   Light headedness 06/24/2020   Hearing loss 06/24/2020   Binocular vision disorder with diplopia 06/24/2020   Stress 05/17/2020   Chest pain 05/16/2020   Welcome to Medicare preventive visit 06/20/2019   Viral syndrome 01/30/2019   Itching 07/21/2017   Asthma 07/16/2016   Urinary incontinence 07/16/2016   SOB (shortness of breath) 04/01/2016   Bilateral shoulder pain 02/24/2016   Fatigue 01/28/2016   Neck fullness 01/28/2016    Routine general medical examination at a health care facility 07/25/2015   Health care maintenance 10/09/2014   Neck pain 11/26/2013   Hypercholesterolemia 11/26/2013   History of colonic polyps 05/11/2013   Hyperbilirubinemia 01/18/2013   GERD (gastroesophageal reflux disease) 12/03/2012   Cholelithiasis 11/07/2012   IBS (irritable bowel syndrome) 11/07/2012   History of rheumatic fever 08/28/2012   MVP (mitral valve prolapse) 08/28/2012    PCP: Nicki Reaper   REFERRING PROVIDER: Nicki Reaper   REFERRING DIAG:  Diagnosis  R32 (ICD-10-CM) - Urinary incontinence, unspecified type   Rationale for Evaluation and Treatment Rehabilitation  THERAPY DIAG:  No diagnosis found.  ONSET DATE:   SUBJECTIVE:                                 SUBJECTIVE TODAY : Pt completed her sleep study. Pt had her colonoscopy. Pt has not done her HEP due to these medical appts.  SUBJECTIVE STATEMENT ON EVAL 08/09/22 :  Pt is interested in having Pelvic PT assess her new Dx of umbilical hernia and strengthen her abdominal muscles and more control of her incontinence. The incontinence got better after she got COVID and then she noticed  it was a problem again.   Pt is due sleep study. When she got COvid and was on liquid diet , she was only getting up to urinate 2 x night compared to 4-5 x night. Now she is recovered, she is going 3-4 x night.   Pt is due for a colonoscopy  but has not made an appt yet.   1) low abdominal pain L and R, 2-7 /10 pain which occurs after she eats. It has been better since she has been blending things. There is discomfort when she is eating too much.   2) urinary leakage: Pt reports her leakage is better and not longer flooding since her last pelvic PT sessions. Pt kept with the exercises for 10 days.   Pt is due for a cystoscopy and is delaying that because her abdominal pain from her gastroenteritis.  Pt changes 1-2 depends per day. The last couple of days it has been more but not as  many as before.   Pt wants to go the gym and do water walking.   PERTINENT HISTORY:  See above   PAIN:  Are you having pain? No just discomfort at the B low abdominal area   WEIGHT BEARING RESTRICTIONS: No    FALLS:  Has patient fallen in last 6 months? No  LIVING ENVIRONMENT: Lives with: lives alone Lives in: House/apartment Stairs: no  Has following equipment at home: None  OCCUPATION: retired, widow   PLOF: Independent  PATIENT GOALS:   strengthen her abdominal muscles and more control of her incontinence.    OBJECTIVE:    [facility] PT Assessment - 10/29/22 1516       Observation/Other Assessments   Observations incorrect form with past HEP      Bed Mobility   Bed Mobility --   difficulty with log rolling, sit up method            [facility] Adult PT Treatment/Exercise - 10/29/22 1516       Therapeutic Activites    Other Therapeutic Activities discussed d/c and advised pt to work with nutritionist , maintain HEP,      Neuro Re-ed    Neuro Re-ed Details  cued fo technique for HEP and filmed on pt's phone per her request                  HOME EXERCISE PROGRAM: See pt instruction section    ASSESSMENT:  CLINICAL IMPRESSION:                Pt has met 3/5 goals with improve pelvic girdle alignment with shoe lift in R shoe, improved coordination for deep core exercises and pelvic floor quick contractions, and improved awareness of body mechanics to minimize genu valgus and straining pelvic floor.   Increased compliance with recorded videos of HEP. Reviewed HEP and made recordings of HEP which she had question about. Encouraged hip abduction and ankle DF/EV strengthening, deep core and kegel contraction exercises.   Pt is managing other medical appts at this time and has not been as compliant with HEP. Discussed d/c and advised pt to work with nutritionist.   At last session, discussed completing her food diary to be ready to share with nutritionist /  dietitian to minimize fecal leakage/ transition to solid foods and wean off liquid diet.   D/C pt at this time so pt can attend to her medical conditions and improve compliance to her PT HEP.    OBJECTIVE IMPAIRMENTS decreased activity tolerance,  decreased coordination, decreased endurance, decreased mobility, difficulty walking, decreased ROM, decreased strength, decreased safety awareness, hypomobility, increased muscle spasms, impaired flexibility, improper body mechanics, postural dysfunction, and pain. scar restrictions   ACTIVITY LIMITATIONS  self-care,  sleep, home chores, work tasks , fitness   PARTICIPATION LIMITATIONS:  community, gym activities    [place] workup for GI and recovery from [place]    are also affecting patient's functional outcome.    REHAB POTENTIAL: Good   CLINICAL DECISION MAKING: Evolving/moderate complexity   EVALUATION COMPLEXITY: Moderate    PATIENT EDUCATION:    Education details: Showed pt anatomy images. Explained muscles attachments/ connection, physiology of deep core system/ spinal- thoracic-pelvis-lower kinetic chain as they relate to pt's presentation, Sx, and past Hx. Explained what and how these areas of deficits need to be restored to balance and function    See Therapeutic activity / neuromuscular re-education section  Answered pt's questions.   Person educated: Patient Education method: Explanation, Demonstration, Tactile cues, Verbal cues, and Handouts Education comprehension: verbalized understanding, returned demonstration, verbal cues required, tactile cues required, and needs further education     PLAN: PT FREQUENCY: 1x/week   PT DURATION: 10 weeks   PLANNED INTERVENTIONS: Therapeutic exercises, Therapeutic activity, Neuromuscular re-education, Balance training, Gait training, Patient/Family education, Self Care, Joint mobilization, Spinal mobilization, Moist heat, Taping, and Manual therapy, dry needling.    PLAN FOR NEXT SESSION: See clinical impression for plan     GOALS: Goals reviewed with patient? Yes  SHORT TERM GOALS: Target date: 09/05/2022    Pt will demo IND with HEP                    Baseline: Not IND            Goal status: INITIAL   LONG TERM GOALS: Target date: 10/17/2022    1.Pt will demo proper deep core coordination without chest breathing and optimal excursion of diaphragm/pelvic floor in order to promote spinal stability and pelvic floor function  Baseline: dyscoordination Goal status: MET   2.  Pt will demo > 5 pt change on FOTO  to improve QOL and function   Pelvic Pain baseline - PFDI Urinary baseline - Bowel  constipation baseline - Bowel Leakage baseline - Urinary Problem baseline- PFDI Bowel -    Goal status: Deferred   3.  Pt will demo proper body mechanics in against gravity tasks and ADLs  work tasks, fitness  to minimize straining pelvic floor / back                  Baseline: not IND, improper form that places strain on pelvic floor                Goal status:  MET     4. Pt will demo levelled pelvic girdle and shoulder height in order to progress to deep core strengthening HEP and restore mobility at spine, pelvis, gait, posture   Baseline: L shoulder and iliac crest higher  Goal status: MET     5. Pt will increase hip abd B > 4/5 in order to improve pelvic girdle stability and minimize urinary leakage  Baseline: R hip abd 2/5, L 3-/5,  Goal status: Saunders Glance, PT 10/29/2022, 1:47 PM

## 2022-10-31 DIAGNOSIS — R682 Dry mouth, unspecified: Secondary | ICD-10-CM | POA: Diagnosis not present

## 2022-10-31 DIAGNOSIS — M1990 Unspecified osteoarthritis, unspecified site: Secondary | ICD-10-CM | POA: Diagnosis not present

## 2022-10-31 DIAGNOSIS — Z8679 Personal history of other diseases of the circulatory system: Secondary | ICD-10-CM | POA: Diagnosis not present

## 2022-10-31 DIAGNOSIS — R768 Other specified abnormal immunological findings in serum: Secondary | ICD-10-CM | POA: Diagnosis not present

## 2022-10-31 DIAGNOSIS — H04123 Dry eye syndrome of bilateral lacrimal glands: Secondary | ICD-10-CM | POA: Diagnosis not present

## 2022-10-31 DIAGNOSIS — K219 Gastro-esophageal reflux disease without esophagitis: Secondary | ICD-10-CM | POA: Diagnosis not present

## 2022-10-31 DIAGNOSIS — R1084 Generalized abdominal pain: Secondary | ICD-10-CM | POA: Diagnosis not present

## 2022-11-01 ENCOUNTER — Telehealth: Payer: Self-pay | Admitting: Primary Care

## 2022-11-01 LAB — LAB REPORT - SCANNED: EGFR (Non-African Amer.): 85

## 2022-11-01 NOTE — Telephone Encounter (Signed)
Pt states she was there on 10/28/22 for her sleep study even though she is showing as a No show she even described how long it took to get the glue out of her hair

## 2022-11-04 NOTE — Telephone Encounter (Signed)
Pt called back and I let her know that we were not showing that she no showed for the sleep study. Told her that we could see where it was completed and she verbalized understanding. Nothing further needed.

## 2022-11-04 NOTE — Telephone Encounter (Signed)
I left a message for the patient to return my call. I do not see where it says she no showed the appointment.

## 2022-11-07 ENCOUNTER — Telehealth (INDEPENDENT_AMBULATORY_CARE_PROVIDER_SITE_OTHER): Payer: PPO | Admitting: Pulmonary Disease

## 2022-11-07 ENCOUNTER — Encounter: Payer: PPO | Admitting: Physical Therapy

## 2022-11-07 DIAGNOSIS — G4733 Obstructive sleep apnea (adult) (pediatric): Secondary | ICD-10-CM

## 2022-11-07 NOTE — Telephone Encounter (Signed)
Please set patient up for visit to discuss sleep study results

## 2022-11-07 NOTE — Telephone Encounter (Signed)
Mild OSA AHI 10/h Treat or not based on clinical picture

## 2022-11-07 NOTE — Telephone Encounter (Signed)
Spoke to patient and scheduled mychart visit 11/21/2022 at 10:00.  Nothing further needed.

## 2022-11-08 ENCOUNTER — Ambulatory Visit
Admission: RE | Admit: 2022-11-08 | Discharge: 2022-11-08 | Disposition: A | Discharge status: Home / Self Care | Payer: PPO | Source: Ambulatory Visit | Attending: Internal Medicine | Admitting: Internal Medicine

## 2022-11-08 ENCOUNTER — Other Ambulatory Visit: Payer: Self-pay | Admitting: Gastroenterology

## 2022-11-08 DIAGNOSIS — E2839 Other primary ovarian failure: Secondary | ICD-10-CM

## 2022-11-08 DIAGNOSIS — M8589 Other specified disorders of bone density and structure, multiple sites: Secondary | ICD-10-CM | POA: Diagnosis not present

## 2022-11-08 DIAGNOSIS — R1084 Generalized abdominal pain: Secondary | ICD-10-CM

## 2022-11-11 ENCOUNTER — Ambulatory Visit
Admission: RE | Admit: 2022-11-11 | Discharge: 2022-11-11 | Disposition: A | Discharge status: Home / Self Care | Payer: PPO | Source: Ambulatory Visit | Attending: Gastroenterology | Admitting: Gastroenterology

## 2022-11-11 DIAGNOSIS — R1084 Generalized abdominal pain: Secondary | ICD-10-CM | POA: Diagnosis not present

## 2022-11-11 DIAGNOSIS — R109 Unspecified abdominal pain: Secondary | ICD-10-CM | POA: Diagnosis not present

## 2022-11-14 ENCOUNTER — Other Ambulatory Visit: Payer: Self-pay | Admitting: Gastroenterology

## 2022-11-14 DIAGNOSIS — R1084 Generalized abdominal pain: Secondary | ICD-10-CM

## 2022-11-14 DIAGNOSIS — R935 Abnormal findings on diagnostic imaging of other abdominal regions, including retroperitoneum: Secondary | ICD-10-CM

## 2022-11-18 ENCOUNTER — Ambulatory Visit: Payer: PPO | Admitting: Urology

## 2022-11-18 ENCOUNTER — Encounter: Payer: Self-pay | Admitting: Urology

## 2022-11-18 VITALS — Ht 67.0 in | Wt 172.0 lb

## 2022-11-18 DIAGNOSIS — N3941 Urge incontinence: Secondary | ICD-10-CM

## 2022-11-18 DIAGNOSIS — N3281 Overactive bladder: Secondary | ICD-10-CM | POA: Diagnosis not present

## 2022-11-18 DIAGNOSIS — N3946 Mixed incontinence: Secondary | ICD-10-CM

## 2022-11-18 LAB — URINALYSIS, COMPLETE
Bilirubin, UA: NEGATIVE
Glucose, UA: NEGATIVE
Ketones, UA: NEGATIVE
Leukocytes,UA: NEGATIVE
Nitrite, UA: NEGATIVE
Protein,UA: NEGATIVE
RBC, UA: NEGATIVE
Specific Gravity, UA: >1.03 — ABNORMAL HIGH (ref 1.005–1.030)
Urobilinogen, Ur: 0.2 mg/dL (ref 0.2–1.0)
pH, UA: 5 (ref 5.0–7.5)

## 2022-11-18 LAB — MICROSCOPIC EXAMINATION

## 2022-11-18 NOTE — Progress Notes (Signed)
11/18/2022 10:28 AM   Lynn Mann [birth year] [id number]  Referring provider: Dale [place], MD [address] [place],  [place] [address]  Chief Complaint  Patient presents with   Cysto    HPI: SN: Mixed incontinence not interested in medication and opted for percutaneous tibial nerve stimulation.  She was given Myrbetriq and pelvic floor therapy   Patient currently primarily has foot on the floor syndrome.  It can be quite high-volume and she wears a number of pads at night.  She has rare urge incontinence during the day.  She can hold urination for 2 to 5 hours but sometimes she will have key in the door syndrome but otherwise no urge incontinence.  She voids 2-4 times a night and has intermittent ankle edema does not take a diuretic   No stress incontinence or bedwetting   Flow was good and she feels empty   Failed Myrbetriq.  Myrbetriq before COVID helped some.  She has not had a hysterectomy   On pelvic examination well supported bladder neck and negative cough test.  Grade 1 cystocele    patient clinically has an overactive bladder and likely has an element of a nocturnal diuresis.  When she holds it too long during the day she can have uncommon key in door syndrome.  Reassess for cystoscopy in 6 weeks on Gemtesa samples and prescription.  Antimuscarinics and third line therapies especially percutaneous tibial nerve stimulation may be very good options to consider based on severity of symptoms.  Botox and InterStim or other options.  I do not think at this stage she needs urodynamics   Patient can be sitting in bed and also get some urgency possibly triggered by standing.  She has gone back to physical therapy but they held it for now because she is having a little bit of lightheadedness.  This should be taken into consideration if we give her antimuscarinics.  Today For unclear reasons did not take all of or some of the Gemtesa samples.  Still has  urge incontinence.  Frequency stable. On pelvic examination well supported bladder neck and a negative cough test after cystoscopy Cystoscopy: Bladder mucosa and trigone were normal.  No cystitis.  No carcinoma.  No foreign body.  Urethra normal    PMH: Past Medical History:  Diagnosis Date   Arthritis    C. difficile diarrhea    age 84s-40s    Chicken pox    Cholecystitis 11/2011   Did not require sgy - Dr. Sammuel Cooper - [facility]  (cholelithiasis)   H/O Clostridium difficile infection    IBS (irritable bowel syndrome)    MRSA exposure 2005   Spider bite   MVP (mitral valve prolapse)    Stable - Dr. Lady Gary   Rheumatic fever     Surgical History: Past Surgical History:  Procedure Laterality Date   CATARACT EXTRACTION  [id number]   MUSCLE BIOPSY      Home Medications:  Allergies as of 11/18/2022       Reactions   Other Anaphylaxis   Artificial sweetener   Adhesive [tape]    Fd&c Yellow #5 (tartrazine)    Yellow Dyes (non-tartrazine) Other (See Comments)   Arthritis pains - it is tartrazine Yellow #5        Medication List        Accurate as of November 18, 2022 10:28 AM. If you have any questions, ask your nurse or doctor.  STOP taking these medications    dicyclomine 10 MG capsule Commonly known as: BENTYL Stopped by: Martina Sinner, MD   HYDROcodone-acetaminophen 5-325 MG tablet Commonly known as: NORCO/VICODIN Stopped by: Martina Sinner, MD       TAKE these medications    albuterol 108 (90 Base) MCG/ACT inhaler Commonly known as: VENTOLIN HFA Inhale 2 puffs into the lungs every 6 (six) hours as needed.   aspirin 325 MG tablet Take 325 mg by mouth. Takes it occasionally. Has not taken lately   CALCIUM-VITAMIN D3 PO Take by mouth.   cetirizine 10 MG tablet Commonly known as: ZYRTEC Take 1 tablet (10 mg total) by mouth daily as needed for allergies.   esomeprazole 40 MG capsule Commonly known as: NexIUM Take 1 capsule (40 mg total)  by mouth daily.   fluticasone 50 MCG/ACT nasal spray Commonly known as: FLONASE Place 2 sprays into both nostrils daily. prn   Gemtesa 75 MG Tabs Generic drug: Vibegron Take 75 mg by mouth daily.   multivitamin tablet Take 1 tablet by mouth daily.   ondansetron 4 MG disintegrating tablet Commonly known as: ZOFRAN-ODT Take 1 tablet (4 mg total) by mouth every 6 (six) hours as needed for nausea or vomiting.   PRESERVISION AREDS 2 PO Take by mouth.   REFRESH OP Apply 1 drop to eye as needed.   rosuvastatin 5 MG tablet Commonly known as: CRESTOR Take 1 tablet (5 mg total) by mouth daily.   VITAMIN D3 PO Take 1 tablet by mouth daily.        Allergies:  Allergies  Allergen Reactions   Other Anaphylaxis    Artificial sweetener   Adhesive [Tape]    Fd&C Yellow #5 (Tartrazine)    Yellow Dyes (Non-Tartrazine) Other (See Comments)    Arthritis pains - it is tartrazine Yellow #5    Family History: Family History  Problem Relation Age of Onset   Arthritis Mother    Stroke Mother    Diabetes Mother    Hypertension Mother    Arthritis Father    Stroke Father    Diabetes Paternal Grandmother    Cancer Paternal Uncle        colon   Heart disease Other        maternal and paternal side   Breast cancer Paternal Aunt    Breast cancer Cousin    Breast cancer Cousin        female cousin   Breast cancer Cousin     Social History:  reports that she has never smoked. She has never been exposed to tobacco smoke. She has never used smokeless tobacco. She reports that she does not currently use alcohol. She reports that she does not use drugs.  ROS:                                        Physical Exam: Ht 5\' 7"  (1.702 m)   Wt 78 kg   BMI 26.94 kg/m   Constitutional:  Alert and oriented, No acute distress. HEENT: [place] AT, moist mucus membranes.  Trachea midline, no masses.  Laboratory Data: Lab Results  Component Value Date   WBC 6.5  08/02/2022   HGB 14.3 08/02/2022   HCT 44.8 08/02/2022   MCV 91.4 08/02/2022   PLT 299 08/02/2022    Lab Results  Component Value Date   CREATININE 0.69 09/17/2022  No results found for: "PSA"  No results found for: "TESTOSTERONE"  Lab Results  Component Value Date   HGBA1C 5.4 07/15/2017    Urinalysis    Component Value Date/Time   COLORURINE YELLOW (A) 07/25/2022 1350   APPEARANCEUR CLEAR (A) 07/25/2022 1350   APPEARANCEUR Clear 04/28/2019 1333   LABSPEC 1.018 07/25/2022 1350   LABSPEC 1.015 12/17/2011 1804   PHURINE 5.0 07/25/2022 1350   GLUCOSEU NEGATIVE 07/25/2022 1350   GLUCOSEU NEGATIVE 01/02/2022 0909   HGBUR NEGATIVE 07/25/2022 1350   BILIRUBINUR NEGATIVE 07/25/2022 1350   BILIRUBINUR Negative 04/28/2019 1333   BILIRUBINUR Negative 12/17/2011 1804   KETONESUR 5 (A) 07/25/2022 1350   PROTEINUR NEGATIVE 07/25/2022 1350   UROBILINOGEN 0.2 01/02/2022 0909   NITRITE NEGATIVE 07/25/2022 1350   LEUKOCYTESUR NEGATIVE 07/25/2022 1350   LEUKOCYTESUR Negative 12/17/2011 1804    Pertinent Imaging:   Assessment & Plan: I thought it was reasonable to try the Gemtesa again in the future may consider third line therapies and/or antimuscarinics as noted above.  She has nonspecific GI issues and I will call if culture positive.  Cannot remember why she did not take the medicine  1. OAB (overactive bladder)  - Urinalysis, Complete   No follow-ups on file.  Martina Sinner, MD  [facility] [address], [address] [place], [place] [address] [phone]

## 2022-11-20 DIAGNOSIS — R351 Nocturia: Secondary | ICD-10-CM | POA: Diagnosis not present

## 2022-11-20 DIAGNOSIS — N644 Mastodynia: Secondary | ICD-10-CM | POA: Diagnosis not present

## 2022-11-20 DIAGNOSIS — Z01419 Encounter for gynecological examination (general) (routine) without abnormal findings: Secondary | ICD-10-CM | POA: Diagnosis not present

## 2022-11-20 DIAGNOSIS — Z1331 Encounter for screening for depression: Secondary | ICD-10-CM | POA: Diagnosis not present

## 2022-11-20 DIAGNOSIS — Z01411 Encounter for gynecological examination (general) (routine) with abnormal findings: Secondary | ICD-10-CM | POA: Diagnosis not present

## 2022-11-20 DIAGNOSIS — N3281 Overactive bladder: Secondary | ICD-10-CM | POA: Diagnosis not present

## 2022-11-21 ENCOUNTER — Encounter: Payer: PPO | Admitting: Physical Therapy

## 2022-11-21 ENCOUNTER — Telehealth (INDEPENDENT_AMBULATORY_CARE_PROVIDER_SITE_OTHER): Payer: PPO | Admitting: Primary Care

## 2022-11-21 DIAGNOSIS — G4733 Obstructive sleep apnea (adult) (pediatric): Secondary | ICD-10-CM | POA: Diagnosis not present

## 2022-11-21 NOTE — Progress Notes (Signed)
Virtual Visit via Video Note  I connected with Lynn Mann on 11/21/22 at 10:00 AM EDT by a video enabled telemedicine application and verified that I am speaking with the correct person using two identifiers.  Location: Patient: Home Provider: Office    I discussed the limitations of evaluation and management by telemedicine and the availability of in person appointments. The patient expressed understanding and agreed to proceed.  History of Present Illness: 72 year old female, never smoked. PMH significant for mitral valve prolapse, asthma, GERD, IBS, hypercholesterolemia. Former patient of Dr. Sung Amabile, followed for hx mild intermittent asthma   Previous [facility] pulmonary encounter: 06/24/2022 Patient presents today for sleep consult. She has symptoms of snoring and nocturia. She has a lot of fatigue symptoms as well. She has been told by her grandchildren that she snores. She has always been a night owl. She has never really had a regular sleep routine. It does not take her long to fall asleep. 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 She starts her day anywhere from 5am-8am. She had a sleep study in 2019 that she reports was negative for sleep apnea.  She is getting ready to re-start pelvic therapy, her therapist recommended getting repeat sleep study   Sleep questionnaire Symptoms- Snoring Prior sleep study- 2019  Bedtime- 11pm-2am  Time to fall asleep- immediately  Nocturnal awakenings- 3-4 times  Start of day- 5-8am  Weight changes- 10 lbs  Do you operate heavy machinery- no CPAP use- no Oxygen use- no Epworth- 6  11/21/2022 Patient contacted to review sleep study results. Patient has symptoms of snoring and nocturia. Associated daytime fatigue. She mainly sleeps on her side. Sleep study on April 1st showed mild OSA, AHI 10/hour with SpO2 low 82%.  We  reviewed treatment options including weight loss, oral appliance, CPAP therapy or referral to ENT for possible surgical options.  She is very hesitant to try CPAP.    Observations/Objective:  - Appears well with out overt respiratory symptoms  Assessment and Plan:  Mild OSA: - PSG 10/28/22 >> AHI 10/hour - Reviewed risk of untreated sleep apnea and treatment options - Focus on side sleeping position and weight loss - Likely not candidate for oral appliance, she will consider CPAP and get back to [place] with a decision    Follow Up Instructions:   - FU in 6 months or sooner if needed  I discussed the assessment and treatment plan with the patient. The patient was provided an opportunity to ask questions and all were answered. The patient agreed with the plan and demonstrated an understanding of the instructions.   The patient was advised to call back or seek an in-person evaluation if the symptoms worsen or if the condition fails to improve as anticipated.  I provided 22 minutes of non-face-to-face time during this encounter.   Glenford Bayley, NP

## 2022-11-25 ENCOUNTER — Ambulatory Visit
Admission: RE | Admit: 2022-11-25 | Discharge: 2022-11-25 | Disposition: A | Discharge status: Home / Self Care | Payer: PPO | Source: Ambulatory Visit | Attending: Gastroenterology | Admitting: Gastroenterology

## 2022-11-25 DIAGNOSIS — R1084 Generalized abdominal pain: Secondary | ICD-10-CM | POA: Diagnosis not present

## 2022-11-25 DIAGNOSIS — R935 Abnormal findings on diagnostic imaging of other abdominal regions, including retroperitoneum: Secondary | ICD-10-CM | POA: Diagnosis not present

## 2022-11-25 DIAGNOSIS — R1013 Epigastric pain: Secondary | ICD-10-CM | POA: Diagnosis not present

## 2022-11-25 MED ORDER — IOHEXOL 350 MG/ML SOLN
100.0000 mL | Freq: Once | INTRAVENOUS | Status: AC | PRN
  Administered 2022-11-25: 100 mL via INTRAVENOUS

## 2022-11-26 ENCOUNTER — Telehealth: Payer: Self-pay | Admitting: Internal Medicine

## 2022-11-26 NOTE — Telephone Encounter (Signed)
Patient did not specify health reasons but I did not see any reason on her chart that she should not do jury duty? Just wanted to confirm with you

## 2022-11-26 NOTE — Telephone Encounter (Signed)
Please clarify why she feels needs.

## 2022-11-26 NOTE — Telephone Encounter (Signed)
Patient is requesting a letter to get out of jury duty, because of health issues.

## 2022-11-27 ENCOUNTER — Other Ambulatory Visit: Payer: Self-pay | Admitting: Gastroenterology

## 2022-11-27 DIAGNOSIS — R935 Abnormal findings on diagnostic imaging of other abdominal regions, including retroperitoneum: Secondary | ICD-10-CM

## 2022-11-27 DIAGNOSIS — R1084 Generalized abdominal pain: Secondary | ICD-10-CM

## 2022-11-27 NOTE — Telephone Encounter (Signed)
LMTCB

## 2022-11-27 NOTE — Telephone Encounter (Signed)
Patient called office back, the reason is she has issues with focusing and she is also not eating solid food.

## 2022-11-28 NOTE — Telephone Encounter (Signed)
Patient returned office phone call. Note was read. Patient states that Dr Lorin Picket did a jury duty letter before and why she will not do it again.

## 2022-11-28 NOTE — Telephone Encounter (Signed)
LMTCB. These are not reasons that will exempt someone from jury duty. Also, doctors letter also does not exempt someone from jury duty- only postpones.

## 2022-11-29 NOTE — Telephone Encounter (Signed)
Patient is aware of below. She has requested a jury duty letter from GI. Will call back to schedule f/u with Dr Lorin Picket. She says she is seeing too many doctors right now and having tests done so she will call back to schedule.

## 2022-12-06 DIAGNOSIS — N9489 Other specified conditions associated with female genital organs and menstrual cycle: Secondary | ICD-10-CM | POA: Diagnosis not present

## 2022-12-06 DIAGNOSIS — R102 Pelvic and perineal pain: Secondary | ICD-10-CM | POA: Diagnosis not present

## 2022-12-17 DIAGNOSIS — H532 Diplopia: Secondary | ICD-10-CM | POA: Diagnosis not present

## 2022-12-17 DIAGNOSIS — H5203 Hypermetropia, bilateral: Secondary | ICD-10-CM | POA: Diagnosis not present

## 2022-12-17 DIAGNOSIS — H353131 Nonexudative age-related macular degeneration, bilateral, early dry stage: Secondary | ICD-10-CM | POA: Diagnosis not present

## 2022-12-17 DIAGNOSIS — H04123 Dry eye syndrome of bilateral lacrimal glands: Secondary | ICD-10-CM | POA: Diagnosis not present

## 2022-12-17 DIAGNOSIS — H40013 Open angle with borderline findings, low risk, bilateral: Secondary | ICD-10-CM | POA: Diagnosis not present

## 2022-12-17 DIAGNOSIS — H519 Unspecified disorder of binocular movement: Secondary | ICD-10-CM | POA: Diagnosis not present

## 2022-12-30 ENCOUNTER — Encounter
Admission: RE | Admit: 2022-12-30 | Discharge: 2022-12-30 | Disposition: A | Discharge status: Home / Self Care | Payer: PPO | Source: Ambulatory Visit | Attending: Gastroenterology | Admitting: Gastroenterology

## 2022-12-30 DIAGNOSIS — R935 Abnormal findings on diagnostic imaging of other abdominal regions, including retroperitoneum: Secondary | ICD-10-CM | POA: Diagnosis not present

## 2022-12-30 DIAGNOSIS — R1011 Right upper quadrant pain: Secondary | ICD-10-CM | POA: Diagnosis not present

## 2022-12-30 DIAGNOSIS — R1084 Generalized abdominal pain: Secondary | ICD-10-CM | POA: Diagnosis not present

## 2022-12-30 MED ORDER — TECHNETIUM TC 99M MEBROFENIN IV KIT
5.0000 | PACK | Freq: Once | INTRAVENOUS | Status: AC | PRN
  Administered 2022-12-30: 5.21 via INTRAVENOUS

## 2023-01-02 ENCOUNTER — Telehealth: Payer: Self-pay | Admitting: Internal Medicine

## 2023-01-02 NOTE — Telephone Encounter (Signed)
LVMTCB to make follow-up with provider 

## 2023-01-09 ENCOUNTER — Ambulatory Visit (INDEPENDENT_AMBULATORY_CARE_PROVIDER_SITE_OTHER): Payer: PPO | Admitting: Internal Medicine

## 2023-01-09 VITALS — BP 118/70 | HR 89 | Temp 97.9°F | Resp 16 | Ht 67.0 in | Wt 177.0 lb

## 2023-01-09 DIAGNOSIS — J4520 Mild intermittent asthma, uncomplicated: Secondary | ICD-10-CM

## 2023-01-09 DIAGNOSIS — E7800 Pure hypercholesterolemia, unspecified: Secondary | ICD-10-CM

## 2023-01-09 DIAGNOSIS — F439 Reaction to severe stress, unspecified: Secondary | ICD-10-CM | POA: Diagnosis not present

## 2023-01-09 DIAGNOSIS — K219 Gastro-esophageal reflux disease without esophagitis: Secondary | ICD-10-CM | POA: Diagnosis not present

## 2023-01-09 DIAGNOSIS — Z86010 Personal history of colonic polyps: Secondary | ICD-10-CM | POA: Diagnosis not present

## 2023-01-09 DIAGNOSIS — R1084 Generalized abdominal pain: Secondary | ICD-10-CM

## 2023-01-09 DIAGNOSIS — N9489 Other specified conditions associated with female genital organs and menstrual cycle: Secondary | ICD-10-CM | POA: Diagnosis not present

## 2023-01-09 DIAGNOSIS — H532 Diplopia: Secondary | ICD-10-CM

## 2023-01-09 NOTE — Progress Notes (Signed)
Subjective:    Patient ID: Lynn Mann, female    DOB: [birth year], 72 y.o.   MRN: [id number]  Patient here for  Chief Complaint  Patient presents with   Medical Management of Chronic Issues    HPI Here for scheduled follow up. Previously saw ENT - Epley maneuver - resolved vertigo. Had persistent "spatial disorientation". Seeing PT.  Previous double vision.  MRI - unrevealing. Negative myasthenia labs.  Has seen cardiology. Holter no prolonged pauses.  Sleep study - no sleep apnea.  She underwent exercise stress echo where she went just over 4 minutes with no arrhythmia. Echo 11/21 images showed normal resting LV function with what appeared to be augmentation of all segments of the cardiac myocardium with stress. No significant valvular normalities. This did not suggest significant ischemia. F/u with cardiology 10/08/22 - no changes.  Continue crestor. Has had issues with abdominal pain.  EGD and colonoscopy 09/2022 - unremarkable.  F/u with GI 10/31/22 - recommended mesenteric dopplers - Increased peak systolic velocities in the celiac access during expiration suggests median arcuate ligament compression physiology. No evidence of hemodynamically significant stenosis within the SMA. Bentyl.  Continue nexium.  Had gyn exam 11/20/22 - pap. Started on vaginal estrogen.  Saw pulmonary - f/u HST.  Mild OSA.  Recommended cpap. CTA - Widely patent celiac axis, SMA and IMA. No CTA evidence of vascular compromise to bowel.  Acute aorto-mesenteric angle with slit-like appearance of the LEFT renal vein, and dilated LEFT gonadal vein. In combination with dilated periuterine veins, findings as can be seen in pelvic congestion. Correlate with potential symptomatology. HIDA - reduced GB EF.  Discussed above findings and pelvic congestion.  She has an appt next week with vascular to discuss further w/up and evaluation.  Also discussed symptoms and reduced GB EF.  She declines referral to surgery to discuss possible  cholecystectomy.  She is still eating mostly pureed food.  She had eaten some eggs and bread.  She does get full fast.  Breathing stable. No increased cough or congestion.  Did see Dr Kathi Ludwig.  Prednisone - helped.  She does report focus is better and thinking more clear.     Past Medical History:  Diagnosis Date   Arthritis    C. difficile diarrhea    age 44s-40s    Chicken pox    Cholecystitis 11/2011   Did not require sgy - Dr. Sammuel Cooper - [facility]  (cholelithiasis)   H/O Clostridium difficile infection    IBS (irritable bowel syndrome)    MRSA exposure 2005   Spider bite   MVP (mitral valve prolapse)    Stable - Dr. Lady Gary   Rheumatic fever    Past Surgical History:  Procedure Laterality Date   CATARACT EXTRACTION  [id number]   MUSCLE BIOPSY     Family History  Problem Relation Age of Onset   Arthritis Mother    Stroke Mother    Diabetes Mother    Hypertension Mother    Arthritis Father    Stroke Father    Diabetes Paternal Grandmother    Cancer Paternal Uncle        colon   Heart disease Other        maternal and paternal side   Breast cancer Paternal Aunt    Breast cancer Cousin    Breast cancer Cousin        female cousin   Breast cancer Cousin    Social History   Socioeconomic History  Marital status: Widowed    Spouse name: Not on file   Number of children: Not on file   Years of education: 16   Highest education level: Not on file  Occupational History   Occupation: Retired  Tobacco Use   Smoking status: Never    Passive exposure: Never   Smokeless tobacco: Never  Vaping Use   Vaping Use: Never used  Substance and Sexual Activity   Alcohol use: Not Currently    Comment: Rarely   Drug use: No   Sexual activity: Yes    Partners: Male    Birth control/protection: Post-menopausal  Other Topics Concern   Not on file  Social History Narrative   Widowed    2 kids son and daughter   Social Determinants of Health   Financial Resource Strain: Low Risk   (07/01/2022)   Overall Financial Resource Strain (CARDIA)    Difficulty of Paying Living Expenses: Not hard at all  Food Insecurity: No Food Insecurity (07/01/2022)   Hunger Vital Sign    Worried About Running Out of Food in the Last Year: Never true    Ran Out of Food in the Last Year: Never true  Transportation Needs: No Transportation Needs (07/01/2022)   PRAPARE - Administrator, Civil Service (Medical): No    Lack of Transportation (Non-Medical): No  Physical Activity: Insufficiently Active (06/27/2021)   Exercise Vital Sign    Days of Exercise per Week: 2 days    Minutes of Exercise per Session: 50 min  Stress: No Stress Concern Present (07/01/2022)   [employer] of Occupational Health - Occupational Stress Questionnaire    Feeling of Stress : Not at all  Social Connections: Moderately Integrated (07/01/2022)   Social Connection and Isolation Panel [NHANES]    Frequency of Communication with Friends and Family: More than three times a week    Frequency of Social Gatherings with Friends and Family: More than three times a week    Attends Religious Services: More than 4 times per year    Active Member of [employer] or Organizations: Yes    Attends Banker Meetings: More than 4 times per year    Marital Status: Widowed     Review of Systems  Constitutional:  Negative for unexpected weight change.       Diet as outlined.    HENT:  Negative for congestion and sinus pressure.   Respiratory:  Negative for cough and chest tightness.        Breathing stable.   Cardiovascular:  Negative for chest pain and palpitations.       No increased swelling.   Gastrointestinal:  Negative for diarrhea, nausea and vomiting.       Gets full fast as outlined.    Genitourinary:  Negative for difficulty urinating and dysuria.  Musculoskeletal:  Negative for joint swelling and myalgias.  Skin:  Negative for color change and rash.  Neurological:  Negative for dizziness and  headaches.  Psychiatric/Behavioral:  Negative for agitation and dysphoric mood.        Objective:     BP 118/70   Pulse 89   Temp 97.9 F (36.6 C)   Resp 16   Ht 5\' 7"  (1.702 m)   Wt 177 lb (80.3 kg)   SpO2 97%   BMI 27.72 kg/m  Wt Readings from Last 3 Encounters:  01/14/23 173 lb (78.5 kg)  01/09/23 177 lb (80.3 kg)  11/18/22 172 lb (78 kg)  Physical Exam Vitals reviewed.  Constitutional:      General: She is not in acute distress.    Appearance: Normal appearance.  HENT:     Head: Normocephalic and atraumatic.     Right Ear: External ear normal.     Left Ear: External ear normal.  Eyes:     General: No scleral icterus.       Right eye: No discharge.        Left eye: No discharge.     Conjunctiva/sclera: Conjunctivae normal.  Neck:     Thyroid: No thyromegaly.  Cardiovascular:     Rate and Rhythm: Normal rate and regular rhythm.  Pulmonary:     Effort: No respiratory distress.     Breath sounds: Normal breath sounds. No wheezing.  Abdominal:     General: Bowel sounds are normal.     Palpations: Abdomen is soft.     Tenderness: There is no abdominal tenderness.  Musculoskeletal:        General: No swelling or tenderness.     Cervical back: Neck supple. No tenderness.  Lymphadenopathy:     Cervical: No cervical adenopathy.  Skin:    Findings: No erythema or rash.  Neurological:     Mental Status: She is alert.  Psychiatric:        Mood and Affect: Mood normal.        Behavior: Behavior normal.      Outpatient Encounter Medications as of 01/09/2023  Medication Sig   albuterol (VENTOLIN HFA) 108 (90 Base) MCG/ACT inhaler Inhale 2 puffs into the lungs every 6 (six) hours as needed.   Calcium Carbonate-Vitamin D (CALCIUM-VITAMIN D3 PO) Take by mouth.   cetirizine (ZYRTEC) 10 MG tablet Take 1 tablet (10 mg total) by mouth daily as needed for allergies.   Cholecalciferol (VITAMIN D3 PO) Take 1 tablet by mouth daily.   esomeprazole (NEXIUM) 40 MG  capsule Take 1 capsule (40 mg total) by mouth daily.   fluticasone (FLONASE) 50 MCG/ACT nasal spray Place 2 sprays into both nostrils daily. prn   Multiple Vitamin (MULTIVITAMIN) tablet Take 1 tablet by mouth daily.   Multiple Vitamins-Minerals (PRESERVISION AREDS 2 PO) Take by mouth.   ondansetron (ZOFRAN-ODT) 4 MG disintegrating tablet Take 1 tablet (4 mg total) by mouth every 6 (six) hours as needed for nausea or vomiting.   Polyvinyl Alcohol-Povidone (REFRESH OP) Apply 1 drop to eye as needed.   rosuvastatin (CRESTOR) 5 MG tablet Take 1 tablet (5 mg total) by mouth daily.   Vibegron (GEMTESA) 75 MG TABS Take 75 mg by mouth daily.   [DISCONTINUED] aspirin 325 MG tablet Take 325 mg by mouth. Takes it occasionally. Has not taken lately (Patient not taking: Reported on 01/14/2023)   No facility-administered encounter medications on file as of 01/09/2023.     Lab Results  Component Value Date   WBC 6.5 08/02/2022   HGB 14.3 08/02/2022   HCT 44.8 08/02/2022   PLT 299 08/02/2022   GLUCOSE 98 09/17/2022   CHOL 184 09/17/2022   TRIG 175.0 (H) 09/17/2022   HDL 43.70 09/17/2022   LDLDIRECT 110.0 02/11/2017   LDLCALC 105 (H) 09/17/2022   ALT 12 09/17/2022   AST 18 09/17/2022   NA 137 09/17/2022   K 4.2 09/17/2022   CL 101 09/17/2022   CREATININE 0.69 09/17/2022   BUN 15 09/17/2022   CO2 30 09/17/2022   TSH 4.74 09/17/2022   HGBA1C 5.4 07/15/2017    NM Hepato W/EF  Result Date: 12/30/2022 CLINICAL DATA:  Right upper quadrant abdominal pain EXAM: NUCLEAR MEDICINE HEPATOBILIARY IMAGING WITH GALLBLADDER EF TECHNIQUE: Sequential images of the abdomen were obtained out to 60 minutes following intravenous administration of radiopharmaceutical. After slow intravenous infusion of 1.56 micrograms Cholecystokinin, gallbladder ejection fraction was determined. RADIOPHARMACEUTICALS:  5.21 mCi Tc-81m Choletec IV COMPARISON:  CT November 25, 2022 FINDINGS: Prompt uptake and biliary excretion of activity by  the liver is seen. Gallbladder activity is visualized, consistent with patency of cystic duct. Biliary activity passes into small bowel, consistent with patent common bile duct. Calculated gallbladder ejection fraction is 29%. (At 60 min, normal ejection fraction is greater than 40%.) IMPRESSION: Reduced gallbladder ejection fraction as can be seen with chronic cholecystitis/biliary dyskinesia. Electronically Signed   By: Maudry Mayhew M.D.   On: 12/30/2022 17:35       Assessment & Plan:  Mild intermittent asthma without complication Assessment & Plan: Breathing overall appears to be stable.  Lungs clear.     Binocular vision disorder with diplopia Assessment & Plan: Is followed by optometry.  Recently evaluated by neurology.  Labs unrevealing (including MG labs).  MRI - no acute abnormality.  Continue f/u with neurology.     Generalized abdominal pain Assessment & Plan: EGD and colonoscopy 09/2022 - unremarkable.  F/u with GI 10/31/22 - recommended mesenteric dopplers - Increased peak systolic velocities in the celiac access during expiration suggests median arcuate ligament compression physiology. No evidence of hemodynamically significant stenosis within the SMA. Bentyl.  Continue nexium.  Had gyn exam 11/20/22 - pap. Started on vaginal estrogen.  Saw pulmonary - f/u HST.  Mild OSA.  Recommended cpap. CTA - Widely patent celiac axis, SMA and IMA. No CTA evidence of vascular compromise to bowel.  Acute aorto-mesenteric angle with slit-like appearance of the LEFT renal vein, and dilated LEFT gonadal vein. In combination with dilated periuterine veins, findings as can be seen in pelvic congestion. Correlate with potential symptomatology. HIDA - reduced GB EF.  Discussed above findings and pelvic congestion.  She has an appt next week with vascular to discuss further w/up and evaluation.  Also discussed symptoms and reduced GB EF.  She declines referral to surgery to discuss possible cholecystectomy.   She is still eating mostly pureed food.  She had eaten some eggs and bread.  She does get full fast.    Gastroesophageal reflux disease, unspecified whether esophagitis present Assessment & Plan: Continue PPI.    History of colonic polyps Assessment & Plan: Colonoscopy 09/2022 - unremarkable.    Hypercholesterolemia Assessment & Plan: Has been off crestor.  Low cholesterol diet and exercise.  Follow lipid panel and liver function tests.    Pelvic congestive syndrome Assessment & Plan: CTA - Widely patent celiac axis, SMA and IMA. No CTA evidence of vascular compromise to bowel.  Acute aorto-mesenteric angle with slit-like appearance of the LEFT renal vein, and dilated LEFT gonadal vein. In combination with dilated periuterine veins, findings as can be seen in pelvic congestion. Correlate with potential symptomatology. HIDA - reduced GB EF.  Discussed above findings and pelvic congestion.  She has an appt next week with vascular to discuss further w/up and evaluation.    Stress Assessment & Plan: Overall appears to be handling things relatively well.  Follow.       Dale Pierceton, MD

## 2023-01-14 ENCOUNTER — Ambulatory Visit (INDEPENDENT_AMBULATORY_CARE_PROVIDER_SITE_OTHER): Payer: PPO | Admitting: Vascular Surgery

## 2023-01-14 VITALS — BP 104/72 | HR 97 | Resp 18 | Ht 67.0 in | Wt 173.0 lb

## 2023-01-14 DIAGNOSIS — N9489 Other specified conditions associated with female genital organs and menstrual cycle: Secondary | ICD-10-CM | POA: Insufficient documentation

## 2023-01-14 DIAGNOSIS — E7800 Pure hypercholesterolemia, unspecified: Secondary | ICD-10-CM

## 2023-01-14 NOTE — Progress Notes (Signed)
Patient ID: Lynn Mann, female   DOB: [birth year], 72 y.o.   MRN: [id number]  Chief Complaint  Patient presents with   Establish Care    HPI Lynn Mann is a 72 y.o. female.  I am asked to see the patient by Tawni Pummel for evaluation of abdominal pain and a CT scan consistent with pelvic congestion syndrome.  I have independently reviewed her CT scan.  There does appear to be marked left gonadal vein varicosities with enlargement and congestion of the pelvic veins.  This also appears to be associated with narrowing of the left renal vein.  She has been having the symptoms since a COVID episode about 6 months ago.  She is been really unable to eat well due to fullness and bloating in her abdomen.  Her lower abdomen is the most severely affected particularly on the left side.  No previous history of DVT or superficial thrombophlebitis to her knowledge.     Past Medical History:  Diagnosis Date   Arthritis    C. difficile diarrhea    age 2s-40s    Chicken pox    Cholecystitis 11/2011   Did not require sgy - Dr. Sammuel Cooper - [facility]  (cholelithiasis)   H/O Clostridium difficile infection    IBS (irritable bowel syndrome)    MRSA exposure 2005   Spider bite   MVP (mitral valve prolapse)    Stable - Dr. Lady Gary   Rheumatic fever     Past Surgical History:  Procedure Laterality Date   CATARACT EXTRACTION  [id number]   MUSCLE BIOPSY       Family History  Problem Relation Age of Onset   Arthritis Mother    Stroke Mother    Diabetes Mother    Hypertension Mother    Arthritis Father    Stroke Father    Diabetes Paternal Grandmother    Cancer Paternal Uncle        colon   Heart disease Other        maternal and paternal side   Breast cancer Paternal Aunt    Breast cancer Cousin    Breast cancer Cousin        female cousin   Breast cancer Cousin       Social History   Tobacco Use   Smoking status: Never    Passive exposure: Never   Smokeless tobacco: Never   Vaping Use   Vaping Use: Never used  Substance Use Topics   Alcohol use: Not Currently    Comment: Rarely   Drug use: No     Allergies  Allergen Reactions   Other Anaphylaxis    Artificial sweetener   Adhesive [Tape]    Fd&C Yellow #5 (Tartrazine)    Yellow Dyes (Non-Tartrazine) Other (See Comments)    Arthritis pains - it is tartrazine Yellow #5    Current Outpatient Medications  Medication Sig Dispense Refill   albuterol (VENTOLIN HFA) 108 (90 Base) MCG/ACT inhaler Inhale 2 puffs into the lungs every 6 (six) hours as needed.     Calcium Carbonate-Vitamin D (CALCIUM-VITAMIN D3 PO) Take by mouth.     cetirizine (ZYRTEC) 10 MG tablet Take 1 tablet (10 mg total) by mouth daily as needed for allergies. 30 tablet 11   Cholecalciferol (VITAMIN D3 PO) Take 1 tablet by mouth daily.     esomeprazole (NEXIUM) 40 MG capsule Take 1 capsule (40 mg total) by mouth daily. 30 capsule 2   estradiol (ESTRACE) 0.1  MG/GM vaginal cream Insert fingertip unit vaginally and on urethra nightly x 2 weeks, then every other night x 2 weeks, then 2-3 times weekly for maintenance     fluticasone (FLONASE) 50 MCG/ACT nasal spray Place 2 sprays into both nostrils daily. prn 16 g 11   Multiple Vitamin (MULTIVITAMIN) tablet Take 1 tablet by mouth daily.     Multiple Vitamins-Minerals (PRESERVISION AREDS 2 PO) Take by mouth.     ondansetron (ZOFRAN-ODT) 4 MG disintegrating tablet Take 1 tablet (4 mg total) by mouth every 6 (six) hours as needed for nausea or vomiting. 20 tablet 0   Polyvinyl Alcohol-Povidone (REFRESH OP) Apply 1 drop to eye as needed.     rosuvastatin (CRESTOR) 5 MG tablet Take 1 tablet (5 mg total) by mouth daily. 30 tablet 2   senna (SENOKOT) 8.6 MG TABS tablet Take 1 tablet by mouth daily as needed for mild constipation.     Vibegron (GEMTESA) 75 MG TABS Take 75 mg by mouth daily. 28 tablet 0   No current facility-administered medications for this visit.      REVIEW OF SYSTEMS (Negative  unless checked)  Constitutional: [] Weight loss  [] Fever  [] Chills Cardiac: [] Chest pain   [] Chest pressure   [] Palpitations   [] Shortness of breath when laying flat   [] Shortness of breath at rest   [] Shortness of breath with exertion. Vascular:  [] Pain in legs with walking   [] Pain in legs at rest   [] Pain in legs when laying flat   [] Claudication   [] Pain in feet when walking  [] Pain in feet at rest  [] Pain in feet when laying flat   [] History of DVT   [] Phlebitis   [] Swelling in legs   [] Varicose veins   [] Non-healing ulcers Pulmonary:   [] Uses home oxygen   [] Productive cough   [] Hemoptysis   [] Wheeze  [] COPD   [] Asthma Neurologic:  [] Dizziness  [] Blackouts   [] Seizures   [] History of stroke   [] History of TIA  [] Aphasia   [] Temporary blindness   [] Dysphagia   [] Weakness or numbness in arms   [] Weakness or numbness in legs Musculoskeletal:  [x] Arthritis   [] Joint swelling   [] Joint pain   [] Low back pain Hematologic:  [] Easy bruising  [] Easy bleeding   [] Hypercoagulable state   [] Anemic  [] Hepatitis Gastrointestinal:  [] Blood in stool   [] Vomiting blood  [x] Gastroesophageal reflux/heartburn   [x] Abdominal pain Genitourinary:  [] Chronic kidney disease   [] Difficult urination  [] Frequent urination  [] Burning with urination   [] Hematuria Skin:  [] Rashes   [] Ulcers   [] Wounds Psychological:  [] History of anxiety   []  History of major depression.    Physical Exam BP 104/72 (BP Location: Left Arm)   Pulse 97   Resp 18   Ht 5\' 7"  (1.702 m)   Wt 173 lb (78.5 kg)   BMI 27.10 kg/m  Gen:  WD/WN, NAD.  Appears younger than stated age Head: [place]/AT, No temporalis wasting.  Ear/Nose/Throat: Hearing grossly intact, nares w/o erythema or drainage, oropharynx w/o Erythema/Exudate Eyes: Conjunctiva clear, sclera non-icteric  Neck: trachea midline.  No JVD.  Pulmonary:  Good air movement, respirations not labored, no use of accessory muscles  Cardiac: RRR, no JVD Vascular:  Vessel Right Left  Radial  Palpable Palpable                                   Gastrointestinal:. No masses, surgical incisions, or  scars.  Minimal abdominal tenderness to palpation. Musculoskeletal: M/S 5/5 throughout.  Extremities without ischemic changes.  No deformity or atrophy.  No significant lower extremity edema. Neurologic: Sensation grossly intact in extremities.  Symmetrical.  Speech is fluent. Motor exam as listed above. Psychiatric: Judgment intact, Mood & affect appropriate for pt's clinical situation.  Somewhat anxious affect. Dermatologic: No rashes or ulcers noted.  No cellulitis or open wounds.    Radiology NM Hepato W/EF  Result Date: 12/30/2022 CLINICAL DATA:  Right upper quadrant abdominal pain EXAM: NUCLEAR MEDICINE HEPATOBILIARY IMAGING WITH GALLBLADDER EF TECHNIQUE: Sequential images of the abdomen were obtained out to 60 minutes following intravenous administration of radiopharmaceutical. After slow intravenous infusion of 1.56 micrograms Cholecystokinin, gallbladder ejection fraction was determined. RADIOPHARMACEUTICALS:  5.21 mCi Tc-30m Choletec IV COMPARISON:  CT November 25, 2022 FINDINGS: Prompt uptake and biliary excretion of activity by the liver is seen. Gallbladder activity is visualized, consistent with patency of cystic duct. Biliary activity passes into small bowel, consistent with patent common bile duct. Calculated gallbladder ejection fraction is 29%. (At 60 min, normal ejection fraction is greater than 40%.) IMPRESSION: Reduced gallbladder ejection fraction as can be seen with chronic cholecystitis/biliary dyskinesia. Electronically Signed   By: Maudry Mayhew M.D.   On: 12/30/2022 17:35    Labs Recent Results (from the past 2160 hour(s))  Surgical pathology     Status: None   Collection Time: 10/17/22 11:25 AM  Result Value Ref Range   SURGICAL PATHOLOGY      Surgical Pathology CASE: [id number] PATIENT: [facility] Surgical Pathology Report     Specimen  Submitted: A. Stomach, body; bx B. Esophagus, lower third; bx C. Esophagus, upper third; bx  Clinical History: Indications:  dysphagia, gastro esophageal reflux disease.  Impressions:  Normal esophagus.  Normal stomach.  BX'd. Normal examined duodenum.  Biopsies were taken with a cold forceps for evaluation of eosinophilic esophagitis    DIAGNOSIS: A.  STOMACH, BODY; BIOPSY: - GASTRIC ANTRAL AND OXYNTIC MUCOSA WITH CHRONIC INACTIVE GASTRITIS. - NEGATIVE FOR H. PYLORI, DYSPLASIA, AND MALIGNANCY.  Comment: Given the presence of chronic inflammatory changes, and focal elements suspicious for superficial microorganisms, an immunohistochemical study directed against H. pylori was performed. This study is negative.  B. ESOPHAGUS, LOWER THIRD; BIOPSY: - BENIGN SQUAMOUS MUCOSA DISPLAYING CHANGES SUGGESTIVE OF REFLUX ESOPHAGITIS. - NO INCREASE IN INTRAEPITHELIAL  EOSINOPHILS (LESS THAN 2 PER HPF). - NEGATIVE FOR DYSPLASIA AND MALIGNANCY.  C. ESOPHAGUS, UPPER THIRD; BIOPSY: - BENIGN SQUAMOUS MUCOSA DISPLAYING CHANGES SUGGESTIVE OF REFLUX ESOPHAGITIS. - NO INCREASE IN INTRAEPITHELIAL EOSINOPHILS (LESS THAN 2 PER HPF). - NEGATIVE FOR DYSPLASIA AND MALIGNANCY.  IHC slides were prepared by Sammuel Hines, [place]. All controls stained appropriately.  This test was developed and its performance characteristics determined by [facility]. It has not been cleared or approved by the [place] Food and Drug Administration. The FDA does not require this test to go through premarket FDA review. This test is used for clinical purposes. It should not be regarded as investigational or for research. This laboratory is certified under the Clinical Laboratory Improvement Amendments (CLIA) as qualified to perform high complexity clinical laboratory testing.  GROSS DESCRIPTION: A. Labeled: Stomach-body Received: Formalin Collection time: 11:25 AM on 3/2 07/2022 Placed into formalin time: 11:25 AM on  10/17/2022 Tissue fragment(s): Multiple Size: Aggregate, 1.0 x 0.4 x 0.1 cm Description: Tan soft tissue fragments Entirely submitted in 1 cassette.  B. Labeled: Esophagus-lower third Received: Formalin Collection time: 11:26 AM on 10/17/2022 Placed into formalin  time: 11:26 AM on 10/17/2022 Tissue fragment(s): Multiple Size: Aggregate, 0.7 x 0.5 x 0.1 cm Description: Pale-tan soft tissue fragments Entirely submitted in 1 cassette.  C. Labeled: Esophagus-upper third Received: Formalin Collection time: 11:27 AM on 10/17/2022 Placed into formalin time: 11:27 AM on 10/17/2022 Tissue fragment(s): Multiple Size: Aggregate, 1.0 x 0.4 x 0.1 cm Description: Pale-tan soft tissue fragments Entirely submitted in 1 cassette.  CM 10/18/2022  Final Diagnosis performed by Katherine Mantle, MD.   Electronically signed 10/22/2022 8:23:44AM The electronic signature indicates that the named Attending Pathologist has evaluated the specimen T echnical component performed at [facility], [address] [place], [place] [address] Lab: [phone] Dir: Jolene Schimke, MD, MMM  Professional component performed at [facility], [facility], [address] [place], [place], [place] [address] Lab: [phone] Dir: Beryle Quant, MD   Lab report - scanned     Status: None   Collection Time: 11/01/22  1:18 PM  Result Value Ref Range   EGFR (Non-African Amer.) 85     Comment: Abstracted by HIM  Urinalysis, Complete     Status: Abnormal   Collection Time: 11/18/22 10:11 AM  Result Value Ref Range   Specific Gravity, UA >1.030 (H) 1.005 - 1.030   pH, UA 5.0 5.0 - 7.5   Color, UA Yellow Yellow   Appearance Ur Clear Clear   Leukocytes,UA Negative Negative   Protein,UA Negative Negative/Trace   Glucose, UA Negative Negative   Ketones, UA Negative Negative   RBC, UA Negative Negative   Bilirubin, UA Negative Negative   Urobilinogen, Ur 0.2 0.2 - 1.0 mg/dL   Nitrite, UA Negative Negative   Microscopic  Examination See below:   Microscopic Examination     Status: Abnormal   Collection Time: 11/18/22 10:11 AM   Urine  Result Value Ref Range   WBC, UA 0-5 0 - 5 /hpf   RBC, Urine 0-2 0 - 2 /hpf   Epithelial Cells (non renal) 0-10 0 - 10 /hpf   Mucus, UA Present (A) Not Estab.   Bacteria, UA Moderate (A) None seen/Few    Assessment/Plan:  Pelvic congestive syndrome The patient has radiologic findings consistent with pelvic congestion syndrome as well as symptoms that are worrisome for pelvic congestion syndrome.  We discussed the pathophysiology and natural history of pelvic congestion syndrome.  I provided a video for her to watch today regarding the diagnosis and treatment of pelvic congestion syndrome.  I answered all of her questions satisfactorily.  After discussions of treatment options, we discussed pelvic venous embolization with likely renal angioplasty.  I discussed the risks and benefits of the procedure.  She voices her understanding and is agreeable to proceed.  Hypercholesterolemia lipid control important in reducing the progression of atherosclerotic disease. Continue statin therapy      Festus Barren 01/14/2023, 3:11 PM   This note was created with Dragon medical transcription system.  Any errors from dictation are unintentional.

## 2023-01-14 NOTE — Assessment & Plan Note (Signed)
The patient has radiologic findings consistent with pelvic congestion syndrome as well as symptoms that are worrisome for pelvic congestion syndrome.  We discussed the pathophysiology and natural history of pelvic congestion syndrome.  I provided a video for her to watch today regarding the diagnosis and treatment of pelvic congestion syndrome.  I answered all of her questions satisfactorily.  After discussions of treatment options, we discussed pelvic venous embolization with likely renal angioplasty.  I discussed the risks and benefits of the procedure.  She voices her understanding and is agreeable to proceed.

## 2023-01-14 NOTE — Assessment & Plan Note (Signed)
lipid control important in reducing the progression of atherosclerotic disease. Continue statin therapy  

## 2023-01-15 ENCOUNTER — Encounter: Payer: Self-pay | Admitting: Internal Medicine

## 2023-01-15 NOTE — Assessment & Plan Note (Signed)
Is followed by optometry.  Recently evaluated by neurology.  Labs unrevealing (including MG labs).  MRI - no acute abnormality.  Continue f/u with neurology.   

## 2023-01-15 NOTE — Assessment & Plan Note (Signed)
CTA - Widely patent celiac axis, SMA and IMA. No CTA evidence of vascular compromise to bowel.  Acute aorto-mesenteric angle with slit-like appearance of the LEFT renal vein, and dilated LEFT gonadal vein. In combination with dilated periuterine veins, findings as can be seen in pelvic congestion. Correlate with potential symptomatology. HIDA - reduced GB EF.  Discussed above findings and pelvic congestion.  She has an appt next week with vascular to discuss further w/up and evaluation.

## 2023-01-15 NOTE — Assessment & Plan Note (Signed)
EGD and colonoscopy 09/2022 - unremarkable.  F/u with GI 10/31/22 - recommended mesenteric dopplers - Increased peak systolic velocities in the celiac access during expiration suggests median arcuate ligament compression physiology. No evidence of hemodynamically significant stenosis within the SMA. Bentyl.  Continue nexium.  Had gyn exam 11/20/22 - pap. Started on vaginal estrogen.  Saw pulmonary - f/u [facility].  Mild OSA.  Recommended cpap. CTA - Widely patent celiac axis, SMA and IMA. No CTA evidence of vascular compromise to bowel.  Acute aorto-mesenteric angle with slit-like appearance of the LEFT renal vein, and dilated LEFT gonadal vein. In combination with dilated periuterine veins, findings as can be seen in pelvic congestion. Correlate with potential symptomatology. HIDA - reduced GB EF.  Discussed above findings and pelvic congestion.  She has an appt next week with vascular to discuss further w/up and evaluation.  Also discussed symptoms and reduced GB EF.  She declines referral to surgery to discuss possible cholecystectomy.  She is still eating mostly pureed food.  She had eaten some eggs and bread.  She does get full fast.

## 2023-01-15 NOTE — Assessment & Plan Note (Signed)
Has been off crestor.  Low cholesterol diet and exercise.  Follow lipid panel and liver function tests.  

## 2023-01-15 NOTE — Assessment & Plan Note (Signed)
Breathing overall appears to be stable.  Lungs clear.   

## 2023-01-15 NOTE — Assessment & Plan Note (Signed)
Overall appears to be handling things relatively well.  Follow.   

## 2023-01-15 NOTE — Assessment & Plan Note (Signed)
Continue PPI ?

## 2023-01-15 NOTE — Assessment & Plan Note (Signed)
Colonoscopy 09/2022 - unremarkable.

## 2023-01-16 ENCOUNTER — Telehealth (INDEPENDENT_AMBULATORY_CARE_PROVIDER_SITE_OTHER): Payer: Self-pay

## 2023-01-16 NOTE — Telephone Encounter (Signed)
I attempted to contact the patient to schedule a pelvic embolization and renal angio with Dr. Wyn Quaker. A message was left for a return call.

## 2023-01-17 NOTE — Telephone Encounter (Signed)
Patient called back to state she wanted to wait awhile before scheduling her procedure and will call back around August.

## 2023-01-17 NOTE — Telephone Encounter (Signed)
Patient called and left voicemail and requested callback at [phone] or [phone]

## 2023-01-24 ENCOUNTER — Telehealth: Payer: Self-pay | Admitting: Internal Medicine

## 2023-01-24 DIAGNOSIS — E7800 Pure hypercholesterolemia, unspecified: Secondary | ICD-10-CM

## 2023-01-24 NOTE — Telephone Encounter (Signed)
Patient need orders  °

## 2023-01-27 ENCOUNTER — Other Ambulatory Visit: Payer: Self-pay | Admitting: Internal Medicine

## 2023-01-27 ENCOUNTER — Ambulatory Visit: Payer: PPO | Admitting: Urology

## 2023-01-27 VITALS — BP 127/78 | HR 83 | Ht 61.0 in | Wt 177.1 lb

## 2023-01-27 DIAGNOSIS — N3281 Overactive bladder: Secondary | ICD-10-CM | POA: Diagnosis not present

## 2023-01-27 LAB — URINALYSIS, COMPLETE
Bilirubin, UA: NEGATIVE
Glucose, UA: NEGATIVE
Ketones, UA: NEGATIVE
Leukocytes,UA: NEGATIVE
Nitrite, UA: NEGATIVE
Protein,UA: NEGATIVE
RBC, UA: NEGATIVE
Specific Gravity, UA: 1.02 (ref 1.005–1.030)
Urobilinogen, Ur: 0.2 mg/dL (ref 0.2–1.0)
pH, UA: 5 (ref 5.0–7.5)

## 2023-01-27 LAB — MICROSCOPIC EXAMINATION: Bacteria, UA: NONE SEEN

## 2023-01-27 NOTE — Patient Instructions (Signed)
[employer] InterStim Placement  Past treatments haven't given you satisfactory results, which may be due to the way these treatments focus on the muscle rather than the nerves. They don't target the miscommunication between your bladder and your brain. Because bladder function involves both muscles and nerves, you may need something that addresses the communication problem between the bladder and the brain that can cause symptoms.  [employer] Bladder Control Therapy is unique because it offers a simple evaluation to see if it's right for you. The evaluation is a minimally invasive procedure We will be looking to see if your troublesome bladder symptoms are reduced by at least 50% In as few as 3 days, you'll know if:you're a candidate for long-term treatment we simply need more information   It's important that you document your symptoms before and during your evaluation to help me understand if you see any improvements. I will provide you with a Symptom Tracker and I would like you to document your symptoms for a minimum of three days. Bring your Symptom Tracker back with you at your next appointment.  Tracking daily will help us determine if the therapy delivered by the InterStimT system is right for you. You will have 3 appointments scheduled: 1st appointment for a 20-minute, in-office procedure that allows you to try the therapy for a few days. 2nd appointment to remove the lead (thin wire) from your evaluation. 3rd appointment is the date for long-term therapy if you're a candidate or for an advanced evaluation if we need more information.  Here's a brochure explaining the in-office procedure and long-term therapy, as well as the Symptom Tracker we discussed to document your symptoms. For more information, visit [email or web address].   

## 2023-01-27 NOTE — Progress Notes (Signed)
01/27/2023 10:18 AM   Lynn Mann 03-20-1951 [id number]  Referring provider: Dale [place]ad[place], MD [address] [place],  [place] [address]  Chief Complaint  Patient presents with   Over Active Bladder    Follow up    HPI: SN: Mixed incontinence not interested in medication and opted for percutaneous tibial nerve stimulation.  She was given Myrbetriq and pelvic floor therapy   Patient currently primarily has foot on the floor syndrome.  It can be quite high-volume and she wears a number of pads at night.  She has rare urge incontinence during the day.  She can hold urination for 2 to 5 hours but sometimes she will have key in the door syndrome but otherwise no urge incontinence.  She voids 2-4 times a night and has intermittent ankle edema does not take a diuretic   No stress incontinence or bedwetting   Flow was good and she feels empty   Failed Myrbetriq.  Myrbetriq before COVID helped some.  She has not had a hysterectomy   On pelvic examination well supported bladder neck and negative cough test.  Grade 1 cystocele     patient clinically has an overactive bladder and likely has an element of a nocturnal diuresis.  When she holds it too long during the day she can have uncommon key in door syndrome.  Reassess for cystoscopy in 6 weeks on Gemtesa samples and prescription.  Antimuscarinics and third line therapies especially percutaneous tibial nerve stimulation may be very good options to consider based on severity of symptoms.  Botox and InterStim or other options.  I do not think at this stage she needs urodynamics   Patient can be sitting in bed and also get some urgency possibly triggered by standing.  She has gone back to physical therapy but they held it for now because she is having a little bit of lightheadedness.  This should be taken into consideration if we give her antimuscarinics.   For unclear reasons did not take all of or some of the Gemtesa  samples.  Still has urge incontinence.  . On pelvic examination well supported bladder neck and a negative cough test after cystoscopy Cystoscopy: Normal  I thought it was reasonable to try the Gemtesa again in the future may consider third line therapies and/or antimuscarinics as noted above.  She has nonspecific GI issues and I will call if culture positive.  Cannot remember why she did not take the medicine   Today Patient had decreased nocturia but was still symptomatic with the Gemtesa.  She started going more frequently during the day.  She still has foot on the floor syndrome and little to no urge incontinence during the day.  She did start estrogen by gynecology.  She was just diagnosed as having mild sleep apnea and is going to start CPAP.  She also had nasal stuffiness from [place].  She may have a vascular procedure for congestion syndrome that may be affecting her renal vein.  She was seen by vascular surgery     PMH: Past Medical History:  Diagnosis Date   Arthritis    C. difficile diarrhea    age 56s-40s    Chicken pox    Cholecystitis 11/2011   Did not require sgy - Dr. Sammuel Cooper - [facility]  (cholelithiasis)   H/O Clostridium difficile infection    IBS (irritable bowel syndrome)    MRSA exposure 2005   Spider bite   MVP (mitral valve prolapse)  Stable - Dr. Lady Gary   Rheumatic fever     Surgical History: Past Surgical History:  Procedure Laterality Date   CATARACT EXTRACTION  [id number]   MUSCLE BIOPSY      Home Medications:  Allergies as of 01/27/2023       Reactions   Other Anaphylaxis   Artificial sweetener   Adhesive [tape]    Fd&c Yellow #5 (tartrazine)    Yellow Dyes (non-tartrazine) Other (See Comments)   Arthritis pains - it is tartrazine Yellow #5        Medication List        Accurate as of January 27, 2023 10:18 AM. If you have any questions, ask your nurse or doctor.          STOP taking these medications    ondansetron 4 MG disintegrating  tablet Commonly known as: ZOFRAN-ODT       TAKE these medications    albuterol 108 (90 Base) MCG/ACT inhaler Commonly known as: VENTOLIN HFA Inhale 2 puffs into the lungs every 6 (six) hours as needed.   CALCIUM-VITAMIN D3 PO Take by mouth.   cetirizine 10 MG tablet Commonly known as: ZYRTEC Take 1 tablet (10 mg total) by mouth daily as needed for allergies.   esomeprazole 40 MG capsule Commonly known as: NexIUM Take 1 capsule (40 mg total) by mouth daily.   estradiol 0.1 MG/GM vaginal cream Commonly known as: ESTRACE Insert fingertip unit vaginally and on urethra nightly x 2 weeks, then every other night x 2 weeks, then 2-3 times weekly for maintenance   fluticasone 50 MCG/ACT nasal spray Commonly known as: FLONASE Place 2 sprays into both nostrils daily. prn   Gemtesa 75 MG Tabs Generic drug: Vibegron Take 75 mg by mouth daily.   multivitamin tablet Take 1 tablet by mouth daily.   PRESERVISION AREDS PO Take by mouth. What changed: Another medication with the same name was removed. Continue taking this medication, and follow the directions you see here.   REFRESH OP Apply 1 drop to eye as needed.   rosuvastatin 5 MG tablet Commonly known as: CRESTOR Take 1 tablet (5 mg total) by mouth daily.   senna 8.6 MG Tabs tablet Commonly known as: SENOKOT Take 1 tablet by mouth daily as needed for mild constipation.   VITAMIN D3 PO Take 1 tablet by mouth daily.        Allergies:  Allergies  Allergen Reactions   Other Anaphylaxis    Artificial sweetener   Adhesive [Tape]    Fd&C Yellow #5 (Tartrazine)    Yellow Dyes (Non-Tartrazine) Other (See Comments)    Arthritis pains - it is tartrazine Yellow #5    Family History: Family History  Problem Relation Age of Onset   Arthritis Mother    Stroke Mother    Diabetes Mother    Hypertension Mother    Arthritis Father    Stroke Father    Diabetes Paternal Grandmother    Cancer Paternal Uncle         colon   Heart disease Other        maternal and paternal side   Breast cancer Paternal Aunt    Breast cancer Cousin    Breast cancer Cousin        female cousin   Breast cancer Cousin     Social History:  reports that she has never smoked. She has never been exposed to tobacco smoke. She has never used smokeless tobacco. She reports that she does  not currently use alcohol. She reports that she does not use drugs.  ROS:                                        Physical Exam: BP 127/78   Pulse 83   Ht 5\' 1"  (1.549 m)   Wt 80.3 kg   BMI 33.47 kg/m   Constitutional:  Alert and oriented, No acute distress. HEENT: [place] AT, moist mucus membranes.  Trachea midline, no masses.  Laboratory Data: Lab Results  Component Value Date   WBC 6.5 08/02/2022   HGB 14.3 08/02/2022   HCT 44.8 08/02/2022   MCV 91.4 08/02/2022   PLT 299 08/02/2022    Lab Results  Component Value Date   CREATININE 0.69 09/17/2022    No results found for: "PSA"  No results found for: "TESTOSTERONE"  Lab Results  Component Value Date   HGBA1C 5.4 07/15/2017    Urinalysis    Component Value Date/Time   COLORURINE YELLOW (A) 07/25/2022 1350   APPEARANCEUR Clear 11/18/2022 1011   LABSPEC 1.018 07/25/2022 1350   LABSPEC 1.015 12/17/2011 1804   PHURINE 5.0 07/25/2022 1350   GLUCOSEU Negative 11/18/2022 1011   GLUCOSEU NEGATIVE 01/02/2022 0909   HGBUR NEGATIVE 07/25/2022 1350   BILIRUBINUR Negative 11/18/2022 1011   BILIRUBINUR Negative 12/17/2011 1804   KETONESUR 5 (A) 07/25/2022 1350   PROTEINUR Negative 11/18/2022 1011   PROTEINUR NEGATIVE 07/25/2022 1350   UROBILINOGEN 0.2 01/02/2022 0909   NITRITE Negative 11/18/2022 1011   NITRITE NEGATIVE 07/25/2022 1350   LEUKOCYTESUR Negative 11/18/2022 1011   LEUKOCYTESUR NEGATIVE 07/25/2022 1350   LEUKOCYTESUR Negative 12/17/2011 1804    Pertinent Imaging:   Assessment & Plan: Patient understands pelvic congestion syndrome not  related to her bladder dysfunction my opinion.  Reassess in 4 months.  I went over 3 refractory treatments with full template and handouts.  Role of antimuscarinic discussed.  With the 2 treatments above I thought it was best not to start another treatment yet especially with the CPAP which could help her nighttime symptoms.  Assessed 4 months  1. OAB (overactive bladder)  - Urinalysis, Complete   No follow-ups on file.  Martina Sinner, MD  [facility] [address], [address] [place], [place] [address] [phone]

## 2023-01-28 ENCOUNTER — Ambulatory Visit (INDEPENDENT_AMBULATORY_CARE_PROVIDER_SITE_OTHER): Payer: PPO | Admitting: Family Medicine

## 2023-01-28 ENCOUNTER — Encounter: Payer: Self-pay | Admitting: Family Medicine

## 2023-01-28 ENCOUNTER — Telehealth: Payer: Self-pay | Admitting: Internal Medicine

## 2023-01-28 VITALS — BP 104/64 | HR 85 | Temp 98.0°F | Resp 16 | Ht 61.0 in | Wt 175.4 lb

## 2023-01-28 DIAGNOSIS — J020 Streptococcal pharyngitis: Secondary | ICD-10-CM

## 2023-01-28 DIAGNOSIS — J029 Acute pharyngitis, unspecified: Secondary | ICD-10-CM | POA: Diagnosis not present

## 2023-01-28 LAB — POCT RAPID STREP A (OFFICE): Rapid Strep A Screen: POSITIVE — AB

## 2023-01-28 MED ORDER — SACCHAROMYCES BOULARDII 250 MG PO CAPS
250.0000 mg | ORAL_CAPSULE | Freq: Every day | ORAL | 0 refills | Status: DC
Start: 2023-01-28 — End: 2024-03-23

## 2023-01-28 MED ORDER — PENICILLIN V POTASSIUM 500 MG PO TABS
500.0000 mg | ORAL_TABLET | Freq: Three times a day (TID) | ORAL | 0 refills | Status: AC
Start: 2023-01-28 — End: 2023-02-07

## 2023-01-28 NOTE — Telephone Encounter (Signed)
Per chart review patient was evaluated by Dr Clent Ridges this PM.

## 2023-01-28 NOTE — Telephone Encounter (Signed)
Patient states her phone sometimes looses charge, so we may call her at [phone], [phone], or [phone].  Patient states we may leave a detailed message on her voicemails.

## 2023-01-28 NOTE — Telephone Encounter (Signed)
Pt would like to be called. She did not want to tell me what it was about

## 2023-01-28 NOTE — Telephone Encounter (Signed)
Fasting labs ordered

## 2023-01-28 NOTE — Patient Instructions (Signed)
It was a pleasure meeting you today. Thank you for allowing me to take part in your health care.  Our goals for today as we discussed include:  Start Penicillin 500 mg three times a day for 10 days Start probiotics daily and continue for 14 days after antibiotic treatment  Ibuprofen 200 mg every 6 hours as needed for discomfort  Maintain adequate hydration   If you have any questions or concerns, please do not hesitate to call the office at [phone].  I look forward to our next visit and until then take care and stay safe.  Regards,   Dana Allan, MD   [facility]

## 2023-01-28 NOTE — Progress Notes (Signed)
SUBJECTIVE:   Chief Complaint  Patient presents with   Sore Throat    yesterday   Fatigue   HPI Presents to clinic for acute visit.  Symptoms of sore throat started yesterday. Reports 3 weeks ago had chills on and off, felt fatigued.  Endorses rhinorrhea, post nasal drip and mild shortness of breath with coughing.  Denies any chest pain, vomiting, diarrhea, or urinary symptoms.  T max 99.2.     PERTINENT PMH / PSH: Asthma History of rheumatic fever  OBJECTIVE:  BP 104/64   Pulse 85   Temp 98 F (36.7 C)   Resp 16   Ht 5\' 1"  (1.549 m)   Wt 175 lb 6 oz (79.5 kg)   SpO2 97%   BMI 33.14 kg/m    Physical Exam Vitals reviewed.  Constitutional:      General: She is not in acute distress.    Appearance: Normal appearance. She is obese. She is not ill-appearing, toxic-appearing or diaphoretic.  HENT:     Head: Normocephalic.     Right Ear: Tympanic membrane and ear canal normal.     Left Ear: Tympanic membrane and ear canal normal.     Mouth/Throat:     Mouth: Mucous membranes are moist. No oral lesions.     Pharynx: Oropharynx is clear. Posterior oropharyngeal erythema present. No pharyngeal swelling, oropharyngeal exudate or uvula swelling.     Tonsils: No tonsillar exudate or tonsillar abscesses.  Eyes:     General:        Right eye: No discharge.        Left eye: No discharge.     Conjunctiva/sclera: Conjunctivae normal.  Neck:     Thyroid: No thyromegaly.  Cardiovascular:     Rate and Rhythm: Normal rate and regular rhythm.     Heart sounds: Normal heart sounds.  Pulmonary:     Effort: Pulmonary effort is normal. No respiratory distress.     Breath sounds: Normal breath sounds. No wheezing or rhonchi.  Abdominal:     General: Bowel sounds are normal.  Musculoskeletal:        General: Normal range of motion.  Lymphadenopathy:     Cervical: No cervical adenopathy.  Skin:    General: Skin is warm and dry.  Neurological:     General: No focal deficit  present.     Mental Status: She is alert and oriented to person, place, and time. Mental status is at baseline.  Psychiatric:        Mood and Affect: Mood normal.        Behavior: Behavior normal.        Thought Content: Thought content normal.        Judgment: Judgment normal.        01/28/2023    4:02 PM 09/19/2022   10:14 AM 07/01/2022    1:52 PM 05/08/2022    3:08 PM 02/05/2022    1:26 PM  Depression screen PHQ 2/9  Decreased Interest 0 0 0 0 0  Down, Depressed, Hopeless 0 0 0 0 0  PHQ - 2 Score 0 0 0 0 0  Altered sleeping 0      Tired, decreased energy 0      Change in appetite 0      Feeling bad or failure about yourself  0      Trouble concentrating 0      Moving slowly or fidgety/restless 0      Suicidal thoughts 0  PHQ-9 Score 0      Difficult doing work/chores Not difficult at all          01/28/2023    4:02 PM  GAD 7 : Generalized Anxiety Score  Nervous, Anxious, on Edge 0  Control/stop worrying 0  Worry too much - different things 0  Trouble relaxing 0  Restless 0  Easily annoyed or irritable 0  Afraid - awful might happen 0  Total GAD 7 Score 0  Anxiety Difficulty Not difficult at all    ASSESSMENT/PLAN:  Strep throat Assessment & Plan: In no acute distress.  Hemodynamically stable.  Afebrile. Rapid strep positive Start Penicillin Send throat culture Follow up with PCP if worsening  Orders: -     POCT rapid strep A -     Culture, Group A Strep -     Penicillin V Potassium; Take 1 tablet (500 mg total) by mouth 3 (three) times daily for 10 days.  Dispense: 30 tablet; Refill: 0 -     Saccharomyces boulardii; Take 1 capsule (250 mg total) by mouth daily.  Dispense: 90 capsule; Refill: 0  Sore throat -     POCT rapid strep A -     Culture, Group A Strep   PDMP reviewed  No follow-ups on file.  Dana Allan, MD

## 2023-01-30 LAB — CULTURE, GROUP A STREP
MICRO NUMBER:: 15153163
SPECIMEN QUALITY:: ADEQUATE

## 2023-01-31 ENCOUNTER — Telehealth: Payer: Self-pay | Admitting: Internal Medicine

## 2023-01-31 NOTE — Telephone Encounter (Signed)
Patient would like to know because her strep culture was negative. Patient is asking should she be tested for something else.

## 2023-01-31 NOTE — Telephone Encounter (Signed)
Patient called stating that the saccharomyces boulardii (FLORASTOR) 250 MG capsule was not received at the pharmacy.  She is also requesting labs to be done regarding her nutrient status, because she has not been able to eat well since December due to Covid.

## 2023-01-31 NOTE — Telephone Encounter (Signed)
Called patient to follow up. Patient is going to complete her abx for Strep and will be evaluated over the weekend if symptoms do not improve. Advised probiotic is OTC. Also advised that she will have routine bloodwork done on 7/9. If anything abnormal- will determine if further testing is warranted.

## 2023-02-04 ENCOUNTER — Other Ambulatory Visit (INDEPENDENT_AMBULATORY_CARE_PROVIDER_SITE_OTHER): Payer: PPO

## 2023-02-04 DIAGNOSIS — E7800 Pure hypercholesterolemia, unspecified: Secondary | ICD-10-CM

## 2023-02-04 LAB — CBC WITH DIFFERENTIAL/PLATELET
Basophils Absolute: 0 10*3/uL (ref 0.0–0.1)
Basophils Relative: 0.6 % (ref 0.0–3.0)
Eosinophils Absolute: 0.1 10*3/uL (ref 0.0–0.7)
Eosinophils Relative: 1.5 % (ref 0.0–5.0)
HCT: 40.5 % (ref 36.0–46.0)
Hemoglobin: 13.2 g/dL (ref 12.0–15.0)
Lymphocytes Relative: 36.4 % (ref 12.0–46.0)
Lymphs Abs: 2.1 10*3/uL (ref 0.7–4.0)
MCHC: 32.6 g/dL (ref 30.0–36.0)
MCV: 88.5 fl (ref 78.0–100.0)
Monocytes Absolute: 0.5 10*3/uL (ref 0.1–1.0)
Monocytes Relative: 8 % (ref 3.0–12.0)
Neutro Abs: 3.1 10*3/uL (ref 1.4–7.7)
Neutrophils Relative %: 53.5 % (ref 43.0–77.0)
Platelets: 247 10*3/uL (ref 150.0–400.0)
RBC: 4.58 Mil/uL (ref 3.87–5.11)
RDW: 13.6 % (ref 11.5–15.5)
WBC: 5.9 10*3/uL (ref 4.0–10.5)

## 2023-02-04 LAB — TSH: TSH: 2.91 u[IU]/mL (ref 0.35–5.50)

## 2023-02-04 LAB — LIPID PANEL
Cholesterol: 178 mg/dL (ref 0–200)
HDL: 44.9 mg/dL (ref >39.00)
LDL Cholesterol: 101 mg/dL — ABNORMAL HIGH (ref 0–99)
NonHDL: 133.59
Total CHOL/HDL Ratio: 4
Triglycerides: 163 mg/dL — ABNORMAL HIGH (ref 0.0–149.0)
VLDL: 32.6 mg/dL (ref 0.0–40.0)

## 2023-02-04 LAB — HEPATIC FUNCTION PANEL
ALT: 14 U/L (ref 0–35)
AST: 18 U/L (ref 0–37)
Albumin: 4.1 g/dL (ref 3.5–5.2)
Alkaline Phosphatase: 61 U/L (ref 39–117)
Bilirubin, Direct: 0.2 mg/dL (ref 0.0–0.3)
Total Bilirubin: 1.2 mg/dL (ref 0.2–1.2)
Total Protein: 6.8 g/dL (ref 6.0–8.3)

## 2023-02-04 LAB — BASIC METABOLIC PANEL
BUN: 11 mg/dL (ref 6–23)
CO2: 28 mEq/L (ref 19–32)
Calcium: 9.3 mg/dL (ref 8.4–10.5)
Chloride: 103 mEq/L (ref 96–112)
Creatinine, Ser: 0.72 mg/dL (ref 0.40–1.20)
GFR: 84.73 mL/min (ref >60.00)
Glucose, Bld: 108 mg/dL — ABNORMAL HIGH (ref 70–99)
Potassium: 3.8 mEq/L (ref 3.5–5.1)
Sodium: 138 mEq/L (ref 135–145)

## 2023-02-05 NOTE — Telephone Encounter (Signed)
Pt would like to know if she can have more penicillin v potassium (VEETID) 500 MG tablet antibiotics sent over to her pharmacy? She stated she just started feeling much better so she would like to get more.

## 2023-02-05 NOTE — Telephone Encounter (Signed)
In reviewing, throat culture was negative for strep.  Dr Clent Ridges prescribed 10 days of pcn (per note).  She should not be out of pcn yet.  Per review, was prescribed on 01/28/23.  If persistent problems, will need to be reevaluated.

## 2023-02-05 NOTE — Telephone Encounter (Signed)
PT was seen by Dr. Clent Ridges on 01/28/23, but Clent Ridges is out of the office this week.  Pt states that she is feeling better, but not 100% and contributes it to the VEETID. But doesn't feel like she was on it long enough.

## 2023-02-06 NOTE — Telephone Encounter (Signed)
Received refill request for Florastor please communicate with pt that medication is OTC.  Please see message from Dr. Lorin Picket too.

## 2023-02-06 NOTE — Telephone Encounter (Signed)
See result note regarding Crestor. Pt admitted that she was not taking it consistently, but will start taking it daily.   Pt also advised the Florastor is OTC & that she needs to be re-evaluated if she feels that she needs additional medication for sore throat.

## 2023-02-06 NOTE — Telephone Encounter (Signed)
Pt called back and and she is still taking crestor and did not have any problems with it. Pt stated she never got off the medication and did not know where the provider got that information from

## 2023-02-06 NOTE — Telephone Encounter (Addendum)
Left voicemail to return call to discuss both messages below and lab results below (3 messages).  Cholesterol relatively stable from last check - increased from checks previously.  She was on crestor 5mg  once a day.  Per last note - apparently off.  Did she have problems with the medication.  If no, then would like to restart crestor 5mg  once a day.  If taking crestor 5mg  once a day regularly, then I would like to increase to 10mg  once a day.  If had problems taking crestor, see if agreeable to a trial of another statin.  If so, can start lipitor 10mg  once a day. Hemoglobin, thyroid test, kidney function tests and liver function tests are normal.

## 2023-02-14 ENCOUNTER — Ambulatory Visit
Admission: EM | Admit: 2023-02-14 | Discharge: 2023-02-14 | Disposition: A | Discharge status: Home / Self Care | Payer: PPO | Attending: Emergency Medicine | Admitting: Emergency Medicine

## 2023-02-14 ENCOUNTER — Encounter: Payer: Self-pay | Admitting: Emergency Medicine

## 2023-02-14 ENCOUNTER — Ambulatory Visit: Payer: Self-pay

## 2023-02-14 ENCOUNTER — Ambulatory Visit
Admission: RE | Admit: 2023-02-14 | Discharge: 2023-02-14 | Disposition: A | Discharge status: Home / Self Care | Payer: PPO | Source: Ambulatory Visit | Attending: Internal Medicine | Admitting: Internal Medicine

## 2023-02-14 ENCOUNTER — Ambulatory Visit (INDEPENDENT_AMBULATORY_CARE_PROVIDER_SITE_OTHER): Payer: PPO

## 2023-02-14 DIAGNOSIS — R0602 Shortness of breath: Secondary | ICD-10-CM

## 2023-02-14 DIAGNOSIS — R6883 Chills (without fever): Secondary | ICD-10-CM | POA: Diagnosis not present

## 2023-02-14 DIAGNOSIS — J069 Acute upper respiratory infection, unspecified: Secondary | ICD-10-CM

## 2023-02-14 DIAGNOSIS — R110 Nausea: Secondary | ICD-10-CM | POA: Diagnosis not present

## 2023-02-14 DIAGNOSIS — R059 Cough, unspecified: Secondary | ICD-10-CM | POA: Diagnosis not present

## 2023-02-14 MED ORDER — PREDNISONE 20 MG PO TABS: 40.0000 mg | ORAL_TABLET | Freq: Every day | ORAL | 0 refills | Status: DC

## 2023-02-14 MED ORDER — ALBUTEROL SULFATE HFA 108 (90 BASE) MCG/ACT IN AERS: 2.0000 | INHALATION_SPRAY | RESPIRATORY_TRACT | 0 refills | Status: DC | PRN

## 2023-02-14 MED ORDER — PREDNISONE 10 MG (21) PO TBPK: ORAL_TABLET | Freq: Every day | ORAL | 0 refills | Status: DC

## 2023-02-14 MED ORDER — AMOXICILLIN-POT CLAVULANATE 875-125 MG PO TABS: 1.0000 | ORAL_TABLET | Freq: Two times a day (BID) | ORAL | 0 refills | Status: AC

## 2023-02-14 NOTE — ED Triage Notes (Signed)
Patient presents to UC for chest x-ray due to SOB and respiratory symptoms. Informed her that we do not have radiology onsite today. States she will follow-up at [facility] instead.

## 2023-02-14 NOTE — ED Provider Notes (Addendum)
[facility]    CSN: [id number] Arrival date & time: 02/14/23  1337      History   Chief Complaint Chief Complaint  Patient presents with   Cough    HPI Lynn Mann is a 72 y.o. female.   Patient presents with presents malaise, chills, congestion, rhinorrhea, post nasal drip, sore throat, hoarseness, and a mild productive cough present for 3 weeks. Has not been coughing frequently but when present has caused episodes of vomiting. Took 4 home covid test, all negative. Was evaluated by PCP , rapid strep positive but culture negative, was prescribed penicillin, started to feel better on day 8/10, per patient, doctor would not extend antibiotic as culture was negative. Since fatigue has worsened. Over the last few days has begun to experience left sided ear pain. This morning  became dizzy and nauseated with a pulling sensation at the center of the chest, right sided neck pain and pain in the right calf " Felt like she swallowed a golfball".  denies vomiting. Symptoms has resolved as day has progressed. Has been told by friends she appears short of breath with exertion. Denies wheezing.         Past Medical History:  Diagnosis Date   Arthritis    C. difficile diarrhea    age 37s-40s    Chicken pox    Cholecystitis 11/2011   Did not require sgy - Dr. Sammuel Cooper - [facility]  (cholelithiasis)   H/O Clostridium difficile infection    IBS (irritable bowel syndrome)    MRSA exposure 2005   Spider bite   MVP (mitral valve prolapse)    Stable - Dr. Lady Gary   Rheumatic fever     Patient Active Problem List   Diagnosis Date Noted   Pelvic congestive syndrome 01/14/2023   Estrogen deficiency 06/23/2022   Nocturia 03/12/2022   Snoring 03/12/2022   Muscle weakness 03/11/2022   Joint ache 02/11/2022   Ankle pain 02/05/2022   Fever 12/26/2021   Abdominal pain 12/26/2021   Cough 09/07/2021   Right shoulder pain 07/15/2021   Hematoma 02/11/2021   Ankle swelling 11/26/2020    Low back pain 11/22/2020   Hip pain, right 11/22/2020   Light headedness 06/24/2020   Hearing loss 06/24/2020   Binocular vision disorder with diplopia 06/24/2020   Stress 05/17/2020   Chest pain 05/16/2020   Welcome to Medicare preventive visit 06/20/2019   Viral syndrome 01/30/2019   Itching 07/21/2017   Asthma 07/16/2016   Urinary incontinence 07/16/2016   SOB (shortness of breath) 04/01/2016   Bilateral shoulder pain 02/24/2016   Fatigue 01/28/2016   Neck fullness 01/28/2016   Routine general medical examination at a health care facility 07/25/2015   Health care maintenance 10/09/2014   Neck pain 11/26/2013   Hypercholesterolemia 11/26/2013   History of colonic polyps 05/11/2013   Hyperbilirubinemia 01/18/2013   GERD (gastroesophageal reflux disease) 12/03/2012   Cholelithiasis 11/07/2012   IBS (irritable bowel syndrome) 11/07/2012   History of rheumatic fever 08/28/2012   MVP (mitral valve prolapse) 08/28/2012    Past Surgical History:  Procedure Laterality Date   CATARACT EXTRACTION  [id number]   MUSCLE BIOPSY      OB History   No obstetric history on file.      Home Medications    Prior to Admission medications   Medication Sig Start Date End Date Taking? Authorizing Provider  albuterol (VENTOLIN HFA) 108 (90 Base) MCG/ACT inhaler Inhale 2 puffs into the lungs every 6 (six)  hours as needed. 03/11/22   [provider]  Calcium Carbonate-Vitamin D (CALCIUM-VITAMIN D3 PO) Take by mouth.    [provider]  cetirizine (ZYRTEC) 10 MG tablet Take 1 tablet (10 mg total) by mouth daily as needed for allergies. 05/08/22   McLean-Scocuzza, Pasty Spillers, MD  Cholecalciferol (VITAMIN D3 PO) Take 1 tablet by mouth daily.    [provider]  esomeprazole (NEXIUM) 40 MG capsule TAKE 1 CAPSULE(40 MG) BY MOUTH DAILY 01/28/23   Dale Luxemburg, MD  estradiol (ESTRACE) 0.1 MG/GM vaginal cream Insert fingertip unit vaginally and on urethra nightly x 2 weeks, then  every other night x 2 weeks, then 2-3 times weekly for maintenance 11/20/22   [provider]  fluticasone (FLONASE) 50 MCG/ACT nasal spray Place 2 sprays into both nostrils daily. prn 10/25/21   McLean-Scocuzza, Pasty Spillers, MD  Multiple Vitamin (MULTIVITAMIN) tablet Take 1 tablet by mouth daily.    [provider]  Multiple Vitamins-Minerals (PRESERVISION AREDS PO) Take by mouth.    [provider]  Polyvinyl Alcohol-Povidone (REFRESH OP) Apply 1 drop to eye as needed.    [provider]  rosuvastatin (CRESTOR) 5 MG tablet Take 1 tablet (5 mg total) by mouth daily. 09/13/22   Dale Tickfaw, MD  saccharomyces boulardii (FLORASTOR) 250 MG capsule Take 1 capsule (250 mg total) by mouth daily. 01/28/23   Dana Allan, MD  senna (SENOKOT) 8.6 MG TABS tablet Take 1 tablet by mouth daily as needed for mild constipation.    [provider]    Family History Family History  Problem Relation Age of Onset   Arthritis Mother    Stroke Mother    Diabetes Mother    Hypertension Mother    Arthritis Father    Stroke Father    Diabetes Paternal Grandmother    Cancer Paternal Uncle        colon   Heart disease Other        maternal and paternal side   Breast cancer Paternal Aunt    Breast cancer Cousin    Breast cancer Cousin        female cousin   Breast cancer Cousin     Social History Social History   Tobacco Use   Smoking status: Never    Passive exposure: Never   Smokeless tobacco: Never  Vaping Use   Vaping status: Never Used  Substance Use Topics   Alcohol use: Not Currently    Comment: Rarely   Drug use: No     Allergies   Other, Adhesive [tape], Fd&c yellow #5 (tartrazine), and Yellow dyes (non-tartrazine)   Review of Systems Review of Systems   Physical Exam Triage Vital Signs ED Triage Vitals  Encounter Vitals Group     BP 02/14/23 1352 130/69     Systolic BP Percentile --      Diastolic BP Percentile --      Pulse Rate  02/14/23 1352 70     Resp 02/14/23 1352 18     Temp 02/14/23 1352 98.5 F (36.9 C)     Temp Source 02/14/23 1352 Oral     SpO2 02/14/23 1352 97 %     Weight 02/14/23 1350 175 lb 4.3 oz (79.5 kg)     Height 02/14/23 1350 5\' 1"  (1.549 m)     Head Circumference --      Peak Flow --      Pain Score 02/14/23 1349 0     Pain Loc --  Pain Education --      Exclude from Growth Chart --    No data found.  Updated Vital Signs BP 130/69 (BP Location: Right Arm)   Pulse 70   Temp 98.5 F (36.9 C) (Oral)   Resp 18   Ht 5\' 1"  (1.549 m)   Wt 175 lb 4.3 oz (79.5 kg)   SpO2 97%   BMI 33.12 kg/m   Visual Acuity Right Eye Distance:   Left Eye Distance:   Bilateral Distance:    Right Eye Near:   Left Eye Near:    Bilateral Near:     Physical Exam Constitutional:      Appearance: Normal appearance.  HENT:     Head: Normocephalic.     Right Ear: Hearing, ear canal and external ear normal. A middle ear effusion is present.     Left Ear: Hearing, ear canal and external ear normal. A middle ear effusion is present.     Nose: Congestion and rhinorrhea present.     Mouth/Throat:     Mouth: Mucous membranes are moist.     Pharynx: Posterior oropharyngeal erythema present.  Eyes:     Extraocular Movements: Extraocular movements intact.  Cardiovascular:     Rate and Rhythm: Normal rate and regular rhythm.     Pulses: Normal pulses.     Heart sounds: Normal heart sounds.  Pulmonary:     Effort: Pulmonary effort is normal.     Breath sounds: Normal breath sounds.  Skin:    General: Skin is warm and dry.  Neurological:     Mental Status: She is alert and oriented to person, place, and time. Mental status is at baseline.      UC Treatments / Results  Labs (all labs ordered are listed, but only abnormal results are displayed) Labs Reviewed - No data to display  EKG   Radiology No results found.  Procedures Procedures (including critical care time)  Medications Ordered  in UC Medications - No data to display  Initial Impression / Assessment and Plan / UC Course  I have reviewed the triage vital signs and the nursing notes.  Pertinent labs & imaging results that were available during my care of the patient were reviewed by me and considered in my medical decision making (see chart for details).  Acute URI  Patient is in no signs of distress nor toxic appearing.  Vital signs are stable. chest x-ray negative. Symptomatic for 3 weeks with some improvement while on antibiotic. Augmentin , albuterol inhaler, and prednisone taper sent to pharmacy. May use additional over-the-counter medications as needed for supportive care.  May follow-up with urgent care as needed if symptoms persist or worsen.   Final Clinical Impressions(s) / UC Diagnoses   Final diagnoses:  None   Discharge Instructions   None    ED Prescriptions   None    PDMP not reviewed this encounter.   Valinda Hoar, NP 02/14/23 1452    Valinda Hoar, NP 02/14/23 1452

## 2023-02-14 NOTE — Telephone Encounter (Signed)
Chief Complaint: SOB Symptoms: SOB, runny nose, fatigue, dizzy Frequency: onset about 3 weeks ago positive strep test then Pertinent Negatives: Patient denies pain, nausea, vomiting Disposition: [] ED /[x] Urgent Care (no appt availability in office) / [] Appointment(In office/virtual)/ []  [facility]/ [] Home Care/ [] Refused Recommended Disposition /[] [facility]/ []  Follow-up with PCP Additional Notes: Patient reports SOB, runny nose, fatigue ongoing since having a positive strep test. Patient states her provider office is closed today due to technical issues. Advised patient to be seen at [facility] and patient stated that is what another triage nurse advised. Assisted patient to get scheduled at [facility] today in [place].   Reason for Disposition  [1] MILD difficulty breathing (e.g., minimal/no SOB at rest, SOB with walking, pulse <100) AND [2] NEW-onset or WORSE than normal  Answer Assessment - Initial Assessment Questions 1. RESPIRATORY STATUS: "Describe your breathing?" (e.g., wheezing, shortness of breath, unable to speak, severe coughing)      Shortness of breath 2. ONSET: "When did this breathing problem begin?"      Onset about 3 weeks ago 3. PATTERN "Does the difficult breathing come and go, or has it been constant since it started?"      No it is pretty constant  4. SEVERITY: "How bad is your breathing?" (e.g., mild, moderate, severe)    - MILD: No SOB at rest, mild SOB with walking, speaks normally in sentences, can lie down, no retractions, pulse < 100.    - MODERATE: SOB at rest, SOB with minimal exertion and prefers to sit, cannot lie down flat, speaks in phrases, mild retractions, audible wheezing, pulse 100-120.    - SEVERE: Very SOB at rest, speaks in single words, struggling to breathe, sitting hunched forward, retractions, pulse > 120      Moderate 5. RECURRENT SYMPTOM: "Have you had difficulty breathing before?" If Yes, ask: "When was the last time?" and "What  happened that time?"      No not really  6. CARDIAC HISTORY: "Do you have any history of heart disease?" (e.g., heart attack, angina, bypass surgery, angioplasty)      No  7. LUNG HISTORY: "Do you have any history of lung disease?"  (e.g., pulmonary embolus, asthma, emphysema)     No 8. CAUSE: "What do you think is causing the breathing problem?"      I'm not sure I had strep recently. 9. OTHER SYMPTOMS: "Do you have any other symptoms? (e.g., dizziness, runny nose, cough, chest pain, fever)     Tired, dizzy, pulling feeling in the chest, sore throat, runny nose 10. O2 SATURATION MONITOR:  "Do you use an oxygen saturation monitor (pulse oximeter) at home?" If Yes, ask: "What is your reading (oxygen level) today?" "What is your usual oxygen saturation reading?" (e.g., 95%)       98% but did drop to 93% at one point          12. TRAVEL: "Have you traveled out of the country in the last month?" (e.g., travel history, exposures)       No  Protocols used: Breathing Difficulty-A-AH

## 2023-02-14 NOTE — ED Triage Notes (Signed)
Pt c/o cough, nausea, chills, body aches, left ear pain. Started about 3 weeks ago.

## 2023-02-14 NOTE — Discharge Instructions (Addendum)
Chest x-ray is negative  You have had symptoms for three weeks and I do believe a bacteria is causing symptoms to prolong  Begin Augmentin every morning and every evening for 10 days  Begin prednisone every morning with food for 5 days to open and relax the airways     You can take Tylenol and/or Ibuprofen as needed for fever reduction and pain relief.   For cough: honey 1/2 to 1 teaspoon (you can dilute the honey in water or another fluid).  You can also use guaifenesin and dextromethorphan for cough. You can use a humidifier for chest congestion and cough.  If you don't have a humidifier, you can sit in the bathroom with the hot shower running.      For sore throat: try warm salt water gargles, cepacol lozenges, throat spray, warm tea or water with lemon/honey, popsicles or ice, or OTC cold relief medicine for throat discomfort.   For congestion: take a daily anti-histamine like Zyrtec, Claritin, and a oral decongestant, such as pseudoephedrine.  You can also use Flonase 1-2 sprays in each nostril daily.   It is important to stay hydrated: drink plenty of fluids (water, gatorade/powerade/pedialyte, juices, or teas) to keep your throat moisturized and help further relieve irritation/discomfort.

## 2023-02-24 ENCOUNTER — Telehealth (HOSPITAL_COMMUNITY): Payer: Self-pay | Admitting: Emergency Medicine

## 2023-02-24 MED ORDER — IPRATROPIUM BROMIDE 0.03 % NA SOLN: 2.0000 | Freq: Two times a day (BID) | NASAL | 12 refills | Status: AC

## 2023-02-24 NOTE — Telephone Encounter (Signed)
Patient notified clinic that the last 2 dosages of her Augmentin prescription became wet as she was keeping her pills in a plastic container and asking during travel.  Discussed with patient and she is generally feeling better however still experiencing some fullness to the bilateral ears and malaise/fatigue.  At this time we will discontinue Augmentin and have deferred refilling prescription.  Sent Atrovent as fullness is most likely related to sinus congestion.  Advised patient to monitor fatigue could be residual as she has been sick for at least 1 month and to follow-up with her PCP if continued.  May follow-up with urgent care if ear fullness persist

## 2023-02-26 ENCOUNTER — Encounter: Payer: Self-pay | Admitting: Family Medicine

## 2023-02-26 DIAGNOSIS — J029 Acute pharyngitis, unspecified: Secondary | ICD-10-CM | POA: Insufficient documentation

## 2023-02-26 DIAGNOSIS — J020 Streptococcal pharyngitis: Secondary | ICD-10-CM | POA: Insufficient documentation

## 2023-02-26 NOTE — Assessment & Plan Note (Signed)
In no acute distress.  Hemodynamically stable.  Afebrile. Rapid strep positive Start Penicillin Send throat culture Follow up with PCP if worsening

## 2023-03-03 ENCOUNTER — Telehealth: Payer: Self-pay | Admitting: Internal Medicine

## 2023-03-03 NOTE — Telephone Encounter (Signed)
Pt called stating she has been on an antibiotic and now she is having an upset stomach and would like to be called regarding this

## 2023-03-04 NOTE — Telephone Encounter (Signed)
Pt returned call. Pt stated she finishes the antibiotics, however, the only symptoms she's having is upset stomach. Pt stated she did take probiotic and would like to know if her stools can be check.

## 2023-03-04 NOTE — Telephone Encounter (Signed)
If having persistent diarrhea, needs to be seen - then can determine further w/up.

## 2023-03-04 NOTE — Telephone Encounter (Signed)
When pt returns call, please see original message.

## 2023-03-04 NOTE — Telephone Encounter (Signed)
Are you ok with ordering stool studies on her for diarrhea. Recently on abx. Persistent diarrhea despite taking probiotics.

## 2023-03-04 NOTE — Telephone Encounter (Signed)
Lvm to give pt a call back to get more information abut symptoms.  Need to know if completed antibiotic, did she take a probiotic, and an appt for re-eval.

## 2023-03-04 NOTE — Telephone Encounter (Signed)
Pt needs appt. OK to schedule with Dr Lorin Picket. (I think there is an 104 tomorrow)

## 2023-03-04 NOTE — Telephone Encounter (Signed)
lvm

## 2023-03-04 NOTE — Telephone Encounter (Signed)
Patient returned office phone call. 

## 2023-03-11 NOTE — Telephone Encounter (Signed)
LMTCB

## 2023-03-12 ENCOUNTER — Other Ambulatory Visit: Payer: Self-pay | Admitting: Internal Medicine

## 2023-03-13 ENCOUNTER — Encounter (INDEPENDENT_AMBULATORY_CARE_PROVIDER_SITE_OTHER): Payer: Self-pay

## 2023-03-17 NOTE — Telephone Encounter (Signed)
Pt scheduled by front desk staff to see Dr. Lorin Picket.

## 2023-03-17 NOTE — Telephone Encounter (Signed)
Attempted to call pt.  Unable to leave voicemail due to mail box being full.

## 2023-03-17 NOTE — Telephone Encounter (Signed)
Pt returned [employer]. Note below was read to her. Pt its book to see Dr. Lorin Picket on 03/24/23.

## 2023-03-24 ENCOUNTER — Ambulatory Visit (INDEPENDENT_AMBULATORY_CARE_PROVIDER_SITE_OTHER): Payer: PPO | Admitting: Internal Medicine

## 2023-03-24 ENCOUNTER — Encounter: Payer: Self-pay | Admitting: Obstetrics and Gynecology

## 2023-03-24 ENCOUNTER — Encounter: Payer: Self-pay | Admitting: Internal Medicine

## 2023-03-24 VITALS — BP 122/78 | HR 97 | Temp 100.0°F | Ht 67.0 in | Wt 172.8 lb

## 2023-03-24 DIAGNOSIS — K219 Gastro-esophageal reflux disease without esophagitis: Secondary | ICD-10-CM

## 2023-03-24 DIAGNOSIS — R5383 Other fatigue: Secondary | ICD-10-CM | POA: Diagnosis not present

## 2023-03-24 DIAGNOSIS — N9489 Other specified conditions associated with female genital organs and menstrual cycle: Secondary | ICD-10-CM

## 2023-03-24 DIAGNOSIS — R1084 Generalized abdominal pain: Secondary | ICD-10-CM

## 2023-03-24 DIAGNOSIS — Z1231 Encounter for screening mammogram for malignant neoplasm of breast: Secondary | ICD-10-CM

## 2023-03-24 DIAGNOSIS — H9190 Unspecified hearing loss, unspecified ear: Secondary | ICD-10-CM | POA: Diagnosis not present

## 2023-03-24 DIAGNOSIS — Z860100 Personal history of colon polyps, unspecified: Secondary | ICD-10-CM

## 2023-03-24 DIAGNOSIS — E7800 Pure hypercholesterolemia, unspecified: Secondary | ICD-10-CM | POA: Diagnosis not present

## 2023-03-24 DIAGNOSIS — R32 Unspecified urinary incontinence: Secondary | ICD-10-CM | POA: Diagnosis not present

## 2023-03-24 DIAGNOSIS — J4520 Mild intermittent asthma, uncomplicated: Secondary | ICD-10-CM

## 2023-03-24 NOTE — Progress Notes (Unsigned)
Subjective:    Patient ID: Lynn Mann, female    DOB: 11/24/1950, 72 y.o.   MRN: [id number]  Patient here for  Chief Complaint  Patient presents with  . Diarrhea    HPI Work in appt.  She was initially seen 01/28/23 - diagnosed with strep throat.  Treated with pcn.  Reevaluated 02/14/23 - URI.  Treated with augmentin, prednisone and albuterol inhaler.  She developed diarrhea and had persistent diarrhea.  Started on probiotics.  Diarrhea is better now - resolved.  Occasional soft stool.  Discussed benefiber daily.  Has multiple concerns today.  Reports ears have been uncomfortable.  Feeling present for approximately 5 weeks.  Ears feel puffy.  Itching.  Was questioning if had something in her ear.  Decreased hearing.  Has seen ENT previously for vertigo.  S/p Epley maneuvers.  Doing better.  Previous MRI ok.  Reports increased fatigue.  Discussed may be multifactorial - given recent infections.  Does have sleep apnea.  Holter - no pauses or significant arrhythmia.  Previous negative stress test and ECHO ok.  Discussed the need to treat sleep apnea.  Will notify when agreeable to schedule.  Saw GI - colonoscopy and EGD unremarkable.  No stenosis - SMA.  Work up - vascular congestion.  Saw Dr Wyn Quaker. Planned procedure.  Desires not to schedule at this time. Gallbladder - EF - decreased.  Have discussed cholecystectomy.  Discussed reevaluation for gallbladder issues/removal. She saw gyn - recommended vaginal estrogen.     Past Medical History:  Diagnosis Date  . Arthritis   . C. difficile diarrhea    age 110s-40s   . Chicken pox   . Cholecystitis 11/2011   Did not require sgy - Dr. Sammuel Cooper - [facility]  (cholelithiasis)  . H/O Clostridium difficile infection   . IBS (irritable bowel syndrome)   . MRSA exposure 2005   Spider bite  . MVP (mitral valve prolapse)    Stable - Dr. Lady Gary  . Rheumatic fever    Past Surgical History:  Procedure Laterality Date  . CATARACT EXTRACTION  [id number]  . MUSCLE  BIOPSY     Family History  Problem Relation Age of Onset  . Arthritis Mother   . Stroke Mother   . Diabetes Mother   . Hypertension Mother   . Arthritis Father   . Stroke Father   . Diabetes Paternal Grandmother   . Cancer Paternal Uncle        colon  . Heart disease Other        maternal and paternal side  . Breast cancer Paternal Aunt   . Breast cancer Cousin   . Breast cancer Cousin        female cousin  . Breast cancer Cousin    Social History   Socioeconomic History  . Marital status: Widowed    Spouse name: Not on file  . Number of children: Not on file  . Years of education: 82  . Highest education level: Not on file  Occupational History  . Occupation: Retired  Tobacco Use  . Smoking status: Never    Passive exposure: Never  . Smokeless tobacco: Never  Vaping Use  . Vaping status: Never Used  Substance and Sexual Activity  . Alcohol use: Not Currently    Comment: Rarely  . Drug use: No  . Sexual activity: Yes    Partners: Male    Birth control/protection: Post-menopausal  Other Topics Concern  . Not on file  Social History Narrative   Widowed    2 kids son and daughter   Social Determinants of Health   Financial Resource Strain: Low Risk  (07/01/2022)   Overall Financial Resource Strain (CARDIA)   . Difficulty of Paying Living Expenses: Not hard at all  Food Insecurity: No Food Insecurity (07/01/2022)   Hunger Vital Sign   . Worried About Programme researcher, broadcasting/film/video in the Last Year: Never true   . Ran Out of Food in the Last Year: Never true  Transportation Needs: No Transportation Needs (07/01/2022)   PRAPARE - Transportation   . Lack of Transportation (Medical): No   . Lack of Transportation (Non-Medical): No  Physical Activity: Insufficiently Active (06/27/2021)   Exercise Vital Sign   . Days of Exercise per Week: 2 days   . Minutes of Exercise per Session: 50 min  Stress: No Stress Concern Present (07/01/2022)   [employer] of Occupational  Health - Occupational Stress Questionnaire   . Feeling of Stress : Not at all  Social Connections: Moderately Integrated (07/01/2022)   Social Connection and Isolation Panel [NHANES]   . Frequency of Communication with Friends and Family: More than three times a week   . Frequency of Social Gatherings with Friends and Family: More than three times a week   . Attends Religious Services: More than 4 times per year   . Active Member of Clubs or Organizations: Yes   . Attends Banker Meetings: More than 4 times per year   . Marital Status: Widowed     Review of Systems  Constitutional:  Positive for fatigue. Negative for appetite change and unexpected weight change.  HENT:  Negative for congestion.   Respiratory:  Negative for cough, chest tightness and shortness of breath.   Cardiovascular:  Negative for chest pain and palpitations.       Objective:     BP 122/78   Pulse 97   Temp 100 F (37.8 C)   Ht 5\' 7"  (1.702 m)   Wt 172 lb 12.8 oz (78.4 kg)   SpO2 98%   BMI 27.06 kg/m  Wt Readings from Last 3 Encounters:  03/24/23 172 lb 12.8 oz (78.4 kg)  02/14/23 175 lb 4.3 oz (79.5 kg)  01/28/23 175 lb 6 oz (79.5 kg)    Physical Exam   Outpatient Encounter Medications as of 03/24/2023  Medication Sig  . albuterol (VENTOLIN HFA) 108 (90 Base) MCG/ACT inhaler Inhale 2 puffs into the lungs every 4 (four) hours as needed for wheezing or shortness of breath.  . Calcium Carbonate-Vitamin D (CALCIUM-VITAMIN D3 PO) Take by mouth.  . cetirizine (ZYRTEC) 10 MG tablet Take 1 tablet (10 mg total) by mouth daily as needed for allergies.  . Cholecalciferol (VITAMIN D3 PO) Take 1 tablet by mouth daily.  Marland Kitchen esomeprazole (NEXIUM) 40 MG capsule TAKE 1 CAPSULE(40 MG) BY MOUTH DAILY  . estradiol (ESTRACE) 0.1 MG/GM vaginal cream Insert fingertip unit vaginally and on urethra nightly x 2 weeks, then every other night x 2 weeks, then 2-3 times weekly for maintenance  . fluticasone  (FLONASE) 50 MCG/ACT nasal spray Place 2 sprays into both nostrils daily. prn  . ipratropium (ATROVENT) 0.03 % nasal spray Place 2 sprays into both nostrils every 12 (twelve) hours.  . Multiple Vitamin (MULTIVITAMIN) tablet Take 1 tablet by mouth daily.  . Multiple Vitamins-Minerals (PRESERVISION AREDS PO) Take by mouth.  . Polyvinyl Alcohol-Povidone (REFRESH OP) Apply 1 drop to eye as needed.  Marland Kitchen  predniSONE (STERAPRED UNI-PAK 21 TAB) 10 MG (21) TBPK tablet Take by mouth daily. Take 6 tabs by mouth daily  for 1 days, then 5 tabs for 1 days, then 4 tabs for 1 days, then 3 tabs for 1 days, 2 tabs for 1 days, then 1 tab by mouth daily for 1 days  . rosuvastatin (CRESTOR) 5 MG tablet TAKE 1 TABLET(5 MG) BY MOUTH DAILY  . saccharomyces boulardii (FLORASTOR) 250 MG capsule Take 1 capsule (250 mg total) by mouth daily.  Marland Kitchen senna (SENOKOT) 8.6 MG TABS tablet Take 1 tablet by mouth daily as needed for mild constipation.   No facility-administered encounter medications on file as of 03/24/2023.     Lab Results  Component Value Date   WBC 5.9 02/04/2023   HGB 13.2 02/04/2023   HCT 40.5 02/04/2023   PLT 247.0 02/04/2023   GLUCOSE 108 (H) 02/04/2023   CHOL 178 02/04/2023   TRIG 163.0 (H) 02/04/2023   HDL 44.90 02/04/2023   LDLDIRECT 110.0 02/11/2017   LDLCALC 101 (H) 02/04/2023   ALT 14 02/04/2023   AST 18 02/04/2023   NA 138 02/04/2023   K 3.8 02/04/2023   CL 103 02/04/2023   CREATININE 0.72 02/04/2023   BUN 11 02/04/2023   CO2 28 02/04/2023   TSH 2.91 02/04/2023   HGBA1C 5.4 07/15/2017    DG Chest 2 View  Result Date: 02/14/2023 CLINICAL DATA:  Shortness of breath.  Cough, nausea, chills. EXAM: CHEST - 2 VIEW COMPARISON:  08/02/2022 FINDINGS: The heart size and mediastinal contours are within normal limits. Both lungs are clear. The visualized skeletal structures are unremarkable. IMPRESSION: No active cardiopulmonary disease. Electronically Signed   By: Norva Pavlov M.D.   On:  02/14/2023 14:10       Assessment & Plan:  There are no diagnoses linked to this encounter.   Dale [place], MD

## 2023-03-25 ENCOUNTER — Other Ambulatory Visit: Payer: Self-pay | Admitting: Obstetrics and Gynecology

## 2023-03-25 DIAGNOSIS — N644 Mastodynia: Secondary | ICD-10-CM

## 2023-03-25 NOTE — Assessment & Plan Note (Signed)
EGD and colonoscopy 09/2022 - unremarkable.  F/u with GI 10/31/22 - recommended mesenteric dopplers - Increased peak systolic velocities in the celiac access during expiration suggests median arcuate ligament compression physiology. No evidence of hemodynamically significant stenosis within the SMA. Bentyl.  Continue nexium.  Had gyn exam 11/20/22 - pap. Started on vaginal estrogen.  CTA - Widely patent celiac axis, SMA and IMA. No CTA evidence of vascular compromise to bowel.  Acute aorto-mesenteric angle with slit-like appearance of the LEFT renal vein, and dilated LEFT gonadal vein. In combination with dilated periuterine veins, findings as can be seen in pelvic congestion. Correlate with potential symptomatology. HIDA - reduced GB EF.  Discussed above findings and pelvic congestion.  She was evaluated by vascular surgery.  Planned procedure as outlined.  Wants to hold at this time.  Also discussed symptoms and reduced GB EF.  She declines referral to surgery to discuss possible cholecystectomy.  She is still eating - mostly pureed food.  Wants to continue to monitor for now.  Benefiber - to help regulate bowels.  Diarrhea resolved.

## 2023-03-25 NOTE — Assessment & Plan Note (Addendum)
Reports increased fatigue.  Had extensive cardiac w/up previously as outlined.  Negative stress echo and essentially negative echo.  No arrhythmia.  Discussed cardiac reevaluation given symptoms.  Wants to hold at this time.  Will notify if decides to pursue further w/up. Also discussed diagnosis of sleep apnea.  Discussed need for further treatment and evaluation.  Wants to hold at this time.  Discussed recent illness and need for recovery.  Diarrhea has resolved.  Follow.

## 2023-03-25 NOTE — Assessment & Plan Note (Signed)
Reports ears have been uncomfortable.  This feeling present for approximately 5 weeks.  Ears feel puffy.  Itching.  Was questioning if had something in her ear.  Decreased hearing.  Has seen ENT previously for vertigo.  S/p Epley maneuvers. Exam as outlined.  Dried blood base of left ear canal.  No acute TM abnormality visualized.  Given sensation change and decreased hearing, get her back in with ENT for evaluation.

## 2023-03-25 NOTE — Assessment & Plan Note (Signed)
Colonoscopy 09/2022 - unremarkable.

## 2023-03-25 NOTE — Assessment & Plan Note (Signed)
Continue PPI ?

## 2023-03-25 NOTE — Assessment & Plan Note (Signed)
Has been off crestor.  Low cholesterol diet and exercise.  Follow lipid panel and liver function tests.  

## 2023-03-25 NOTE — Assessment & Plan Note (Addendum)
CTA - Widely patent celiac axis, SMA and IMA. No CTA evidence of vascular compromise to bowel.  Acute aorto-mesenteric angle with slit-like appearance of the LEFT renal vein, and dilated LEFT gonadal vein. In combination with dilated periuterine veins, findings as can be seen in pelvic congestion. Correlate with potential symptomatology.  Saw Dr Wyn Quaker.  Pelvic venous embolization with likely renal angioplasty. Wanted to hold on procedure at this time.  Will notify when agreeable.

## 2023-03-25 NOTE — Assessment & Plan Note (Signed)
Breathing overall appears to be stable.  Lungs clear.   

## 2023-03-25 NOTE — Assessment & Plan Note (Signed)
Followed by urology.   

## 2023-03-26 ENCOUNTER — Telehealth: Payer: Self-pay | Admitting: Internal Medicine

## 2023-03-26 DIAGNOSIS — K219 Gastro-esophageal reflux disease without esophagitis: Secondary | ICD-10-CM | POA: Diagnosis not present

## 2023-03-26 DIAGNOSIS — H6983 Other specified disorders of Eustachian tube, bilateral: Secondary | ICD-10-CM | POA: Diagnosis not present

## 2023-03-26 DIAGNOSIS — H90A22 Sensorineural hearing loss, unilateral, left ear, with restricted hearing on the contralateral side: Secondary | ICD-10-CM | POA: Diagnosis not present

## 2023-03-26 NOTE — Telephone Encounter (Signed)
Patient called and asked could Dr. Lorin Picket call her. She said she went to the Ear nose and throat doctor. And she would like to speak with you about her results. Her number is [phone]. She also wanted to thank you for getting her in so far with the ear nose and throat doctor.

## 2023-03-28 NOTE — Telephone Encounter (Signed)
FYI- Called patient to see what she needed to discuss. She is calling to say that she had eval with ENT and was put on another 12 day taper of steroids for inflammation that they felt could be contributing to her swallowing issues. She knows that steroids elevate her blood sugar and requested insulin to have on hand (preferably the kind you inhale) so she could take it if needed. Advised that insulin is not given as a precaution and sugars are typically elevated with steroids. If persistently elevated- may need to treat with insulin but normally this is with long term use of steroids. Highest CBG 238. Advised hold on insulin. Has appt 9/17. She is going to check and record sugars and bring to appt. Will call if needs something prior to appt.

## 2023-04-15 ENCOUNTER — Encounter: Payer: Self-pay | Admitting: Internal Medicine

## 2023-04-15 ENCOUNTER — Ambulatory Visit (INDEPENDENT_AMBULATORY_CARE_PROVIDER_SITE_OTHER): Payer: PPO | Admitting: Internal Medicine

## 2023-04-15 ENCOUNTER — Ambulatory Visit: Payer: PPO

## 2023-04-15 VITALS — BP 118/70 | HR 96 | Temp 97.9°F | Resp 16 | Ht 67.0 in | Wt 177.0 lb

## 2023-04-15 DIAGNOSIS — Z860100 Personal history of colon polyps, unspecified: Secondary | ICD-10-CM

## 2023-04-15 DIAGNOSIS — R5383 Other fatigue: Secondary | ICD-10-CM

## 2023-04-15 DIAGNOSIS — N9489 Other specified conditions associated with female genital organs and menstrual cycle: Secondary | ICD-10-CM | POA: Diagnosis not present

## 2023-04-15 DIAGNOSIS — R0602 Shortness of breath: Secondary | ICD-10-CM

## 2023-04-15 DIAGNOSIS — E7800 Pure hypercholesterolemia, unspecified: Secondary | ICD-10-CM

## 2023-04-15 DIAGNOSIS — K8020 Calculus of gallbladder without cholecystitis without obstruction: Secondary | ICD-10-CM

## 2023-04-15 DIAGNOSIS — K219 Gastro-esophageal reflux disease without esophagitis: Secondary | ICD-10-CM

## 2023-04-15 NOTE — Progress Notes (Signed)
Subjective:    Patient ID: Lynn Mann, female    DOB: 12-17-1950, 72 y.o.   MRN: [id number]  Patient here for  Chief Complaint  Patient presents with   Medical Management of Chronic Issues    HPI Previous visit concern regarding something in her ears.  Decreased hearing. Has seen ENT previously for vertigo. S/p Epley maneuvers. Doing better. Previous MRI ok. Reported recently noticing has to stop and rest with increased activity - increased fatigue. No chest pain. Breathing overall stable. Previous Holter - no pauses or significant arrhythmia. Previous negative stress test and ECHO ok. Discussed cardiac reevaluation. Wanted to hold previously.  Has appt with cardiology next week.  Saw GI - colonoscopy and EGD unremarkable. No stenosis - SMA. Work up - vascular congestion. CTA did not show evidence of potential median arcuate ligament compression that was demonstrated potentially on ultrasound. Referred to vascular for second opinion. Saw Dr Wyn Quaker. Planned procedure. Desired not to schedule at that time. Given patient continued to have postprandial abdominal pain immediately after eating and is having to liquefy foods - a HIDA scan scheduled. Gallbladder - EF - decreased. Have discussed cholecystectomy. Discussed reevaluation for gallbladder issues/removal. Declines at this time. She reports she is eating more formed foods.  Does report some increased fatigue.     Past Medical History:  Diagnosis Date   Arthritis    C. difficile diarrhea    age 41s-40s    Chicken pox    Cholecystitis 11/2011   Did not require sgy - Dr. Sammuel Cooper - [facility]  (cholelithiasis)   H/O Clostridium difficile infection    IBS (irritable bowel syndrome)    MRSA exposure 2005   Spider bite   MVP (mitral valve prolapse)    Stable - Dr. Lady Gary   Rheumatic fever    Past Surgical History:  Procedure Laterality Date   CATARACT EXTRACTION  [id number]   MUSCLE BIOPSY     Family History  Problem Relation Age of Onset    Arthritis Mother    Stroke Mother    Diabetes Mother    Hypertension Mother    Arthritis Father    Stroke Father    Diabetes Paternal Grandmother    Cancer Paternal Uncle        colon   Heart disease Other        maternal and paternal side   Breast cancer Paternal Aunt    Breast cancer Cousin    Breast cancer Cousin        female cousin   Breast cancer Cousin    Social History   Socioeconomic History   Marital status: Widowed    Spouse name: Not on file   Number of children: Not on file   Years of education: 16   Highest education level: Not on file  Occupational History   Occupation: Retired  Tobacco Use   Smoking status: Never    Passive exposure: Never   Smokeless tobacco: Never  Vaping Use   Vaping status: Never Used  Substance and Sexual Activity   Alcohol use: Not Currently    Comment: Rarely   Drug use: No   Sexual activity: Yes    Partners: Male    Birth control/protection: Post-menopausal  Other Topics Concern   Not on file  Social History Narrative   Widowed    2 kids son and daughter   Social Determinants of Health   Financial Resource Strain: Low Risk  (07/01/2022)   Overall Financial  Resource Strain (CARDIA)    Difficulty of Paying Living Expenses: Not hard at all  Food Insecurity: No Food Insecurity (07/01/2022)   Hunger Vital Sign    Worried About Running Out of Food in the Last Year: Never true    Ran Out of Food in the Last Year: Never true  Transportation Needs: No Transportation Needs (07/01/2022)   PRAPARE - Administrator, Civil Service (Medical): No    Lack of Transportation (Non-Medical): No  Physical Activity: Insufficiently Active (06/27/2021)   Exercise Vital Sign    Days of Exercise per Week: 2 days    Minutes of Exercise per Session: 50 min  Stress: No Stress Concern Present (07/01/2022)   [employer] of Occupational Health - Occupational Stress Questionnaire    Feeling of Stress : Not at all  Social Connections:  Moderately Integrated (07/01/2022)   Social Connection and Isolation Panel [NHANES]    Frequency of Communication with Friends and Family: More than three times a week    Frequency of Social Gatherings with Friends and Family: More than three times a week    Attends Religious Services: More than 4 times per year    Active Member of [employer] or Organizations: Yes    Attends Banker Meetings: More than 4 times per year    Marital Status: Widowed     Review of Systems  Constitutional:  Positive for fatigue. Negative for appetite change and unexpected weight change.  HENT:  Negative for congestion and sinus pressure.   Respiratory:  Negative for cough and chest tightness.   Cardiovascular:  Negative for chest pain and palpitations.  Gastrointestinal:  Negative for abdominal pain, diarrhea, nausea and vomiting.  Genitourinary:  Negative for difficulty urinating and dysuria.       Some urinary incontinence.   Musculoskeletal:  Negative for joint swelling and myalgias.  Skin:  Negative for color change and rash.  Neurological:  Negative for dizziness and headaches.  Psychiatric/Behavioral:  Negative for agitation and dysphoric mood.        Objective:     BP 118/70   Pulse 96   Temp 97.9 F (36.6 C)   Resp 16   Ht 5\' 7"  (1.702 m)   Wt 177 lb (80.3 kg)   SpO2 98%   BMI 27.72 kg/m  Wt Readings from Last 3 Encounters:  04/15/23 177 lb (80.3 kg)  03/24/23 172 lb 12.8 oz (78.4 kg)  02/14/23 175 lb 4.3 oz (79.5 kg)    Physical Exam Vitals reviewed.  Constitutional:      General: She is not in acute distress.    Appearance: Normal appearance.  HENT:     Head: Normocephalic and atraumatic.     Right Ear: External ear normal.     Left Ear: External ear normal.  Eyes:     General: No scleral icterus.       Right eye: No discharge.        Left eye: No discharge.     Conjunctiva/sclera: Conjunctivae normal.  Neck:     Thyroid: No thyromegaly.  Cardiovascular:      Rate and Rhythm: Normal rate and regular rhythm.  Pulmonary:     Effort: No respiratory distress.     Breath sounds: Normal breath sounds. No wheezing.  Abdominal:     General: Bowel sounds are normal.     Palpations: Abdomen is soft.     Tenderness: There is no abdominal tenderness.  Musculoskeletal:  General: No swelling or tenderness.     Cervical back: Neck supple. No tenderness.  Lymphadenopathy:     Cervical: No cervical adenopathy.  Skin:    Findings: No erythema or rash.  Neurological:     Mental Status: She is alert.  Psychiatric:        Mood and Affect: Mood normal.        Behavior: Behavior normal.      Outpatient Encounter Medications as of 04/15/2023  Medication Sig   albuterol (VENTOLIN HFA) 108 (90 Base) MCG/ACT inhaler Inhale 2 puffs into the lungs every 4 (four) hours as needed for wheezing or shortness of breath.   Calcium Carbonate-Vitamin D (CALCIUM-VITAMIN D3 PO) Take by mouth.   cetirizine (ZYRTEC) 10 MG tablet Take 1 tablet (10 mg total) by mouth daily as needed for allergies.   Cholecalciferol (VITAMIN D3 PO) Take 1 tablet by mouth daily.   esomeprazole (NEXIUM) 40 MG capsule TAKE 1 CAPSULE(40 MG) BY MOUTH DAILY   estradiol (ESTRACE) 0.1 MG/GM vaginal cream Insert fingertip unit vaginally and on urethra nightly x 2 weeks, then every other night x 2 weeks, then 2-3 times weekly for maintenance   fluticasone (FLONASE) 50 MCG/ACT nasal spray Place 2 sprays into both nostrils daily. prn   ipratropium (ATROVENT) 0.03 % nasal spray Place 2 sprays into both nostrils every 12 (twelve) hours.   Multiple Vitamin (MULTIVITAMIN) tablet Take 1 tablet by mouth daily.   Multiple Vitamins-Minerals (PRESERVISION AREDS PO) Take by mouth.   Polyvinyl Alcohol-Povidone (REFRESH OP) Apply 1 drop to eye as needed.   rosuvastatin (CRESTOR) 5 MG tablet TAKE 1 TABLET(5 MG) BY MOUTH DAILY   saccharomyces boulardii (FLORASTOR) 250 MG capsule Take 1 capsule (250 mg total) by  mouth daily.   senna (SENOKOT) 8.6 MG TABS tablet Take 1 tablet by mouth daily as needed for mild constipation.   [DISCONTINUED] predniSONE (STERAPRED UNI-PAK 21 TAB) 10 MG (21) TBPK tablet Take by mouth daily. Take 6 tabs by mouth daily  for 1 days, then 5 tabs for 1 days, then 4 tabs for 1 days, then 3 tabs for 1 days, 2 tabs for 1 days, then 1 tab by mouth daily for 1 days   No facility-administered encounter medications on file as of 04/15/2023.     Lab Results  Component Value Date   WBC 5.9 02/04/2023   HGB 13.2 02/04/2023   HCT 40.5 02/04/2023   PLT 247.0 02/04/2023   GLUCOSE 108 (H) 02/04/2023   CHOL 178 02/04/2023   TRIG 163.0 (H) 02/04/2023   HDL 44.90 02/04/2023   LDLDIRECT 110.0 02/11/2017   LDLCALC 101 (H) 02/04/2023   ALT 14 02/04/2023   AST 18 02/04/2023   NA 138 02/04/2023   K 3.8 02/04/2023   CL 103 02/04/2023   CREATININE 0.72 02/04/2023   BUN 11 02/04/2023   CO2 28 02/04/2023   TSH 2.91 02/04/2023   HGBA1C 5.4 07/15/2017    DG Chest 2 View  Result Date: 02/14/2023 CLINICAL DATA:  Shortness of breath.  Cough, nausea, chills. EXAM: CHEST - 2 VIEW COMPARISON:  08/02/2022 FINDINGS: The heart size and mediastinal contours are within normal limits. Both lungs are clear. The visualized skeletal structures are unremarkable. IMPRESSION: No active cardiopulmonary disease. Electronically Signed   By: Norva Pavlov M.D.   On: 02/14/2023 14:10       Assessment & Plan:  SOB (shortness of breath) -     EKG 12-Lead -     DG  Chest 2 View; Future  Calculus of gallbladder without cholecystitis without obstruction Assessment & Plan: Given patient continued to have postprandial abdominal pain immediately after eating and is having to liquefy foods - a HIDA scan scheduled. Gallbladder - EF - decreased. Have discussed cholecystectomy. Discussed reevaluation for gallbladder issues/removal. Declines at this time. She reports she is eating more formed foods.    Other  fatigue Assessment & Plan: Reports increased fatigue.  Had extensive cardiac w/up previously as outlined.  Negative stress echo and essentially negative echo.  No arrhythmia.  Discussed cardiac reevaluation given symptoms.  She has appt with cardiology 04/21/23. Have also discussed diagnosis of sleep apnea.  Discussed need for further treatment and evaluation.  Wanted to hold at this time. Repeat EKG today - SR with no acute ischemic changes.  Also check cxr.     Gastroesophageal reflux disease, unspecified whether esophagitis present Assessment & Plan: Continue PPI.    History of colonic polyps Assessment & Plan: Colonoscopy 09/2022 - unremarkable.    Hypercholesterolemia Assessment & Plan: Cestor.  Low cholesterol diet and exercise.  Follow lipid panel and liver function tests.    Pelvic congestive syndrome Assessment & Plan: colonoscopy and EGD unremarkable. No stenosis - SMA. Work up - vascular congestion. CTA did not show evidence of potential median arcuate ligament compression that was demonstrated potentially on ultrasound. Referred to vascular for second opinion. Saw Dr Wyn Quaker. Planned procedure. Desired not to schedule at that time.      Dale Corning, MD

## 2023-04-16 ENCOUNTER — Ambulatory Visit
Admission: RE | Admit: 2023-04-16 | Discharge: 2023-04-16 | Disposition: A | Discharge status: Home / Self Care | Payer: PPO | Source: Ambulatory Visit | Attending: Obstetrics and Gynecology | Admitting: Obstetrics and Gynecology

## 2023-04-16 DIAGNOSIS — R92323 Mammographic fibroglandular density, bilateral breasts: Secondary | ICD-10-CM | POA: Diagnosis not present

## 2023-04-16 DIAGNOSIS — N644 Mastodynia: Secondary | ICD-10-CM

## 2023-04-20 ENCOUNTER — Encounter: Payer: Self-pay | Admitting: Internal Medicine

## 2023-04-20 NOTE — Assessment & Plan Note (Signed)
colonoscopy and EGD unremarkable. No stenosis - SMA. Work up - vascular congestion. CTA did not show evidence of potential median arcuate ligament compression that was demonstrated potentially on ultrasound. Referred to vascular for second opinion. Saw Dr Wyn Quaker. Planned procedure. Desired not to schedule at that time.

## 2023-04-20 NOTE — Assessment & Plan Note (Signed)
Given patient continued to have postprandial abdominal pain immediately after eating and is having to liquefy foods - a HIDA scan scheduled. Gallbladder - EF - decreased. Have discussed cholecystectomy. Discussed reevaluation for gallbladder issues/removal. Declines at this time. She reports she is eating more formed foods.

## 2023-04-20 NOTE — Assessment & Plan Note (Signed)
Colonoscopy 09/2022 - unremarkable.

## 2023-04-20 NOTE — Assessment & Plan Note (Addendum)
Cestor.  Low cholesterol diet and exercise.  Follow lipid panel and liver function tests.

## 2023-04-20 NOTE — Assessment & Plan Note (Addendum)
Reports increased fatigue.  Had extensive cardiac w/up previously as outlined.  Negative stress echo and essentially negative echo.  No arrhythmia.  Discussed cardiac reevaluation given symptoms.  She has appt with cardiology 04/21/23. Have also discussed diagnosis of sleep apnea.  Discussed need for further treatment and evaluation.  Wanted to hold at this time. Repeat EKG today - SR with no acute ischemic changes.  Also check cxr.

## 2023-04-20 NOTE — Assessment & Plan Note (Signed)
Continue PPI ?

## 2023-04-21 ENCOUNTER — Other Ambulatory Visit: Payer: PPO

## 2023-04-30 ENCOUNTER — Telehealth: Payer: Self-pay | Admitting: Internal Medicine

## 2023-04-30 DIAGNOSIS — E782 Mixed hyperlipidemia: Secondary | ICD-10-CM | POA: Diagnosis not present

## 2023-04-30 DIAGNOSIS — R0609 Other forms of dyspnea: Secondary | ICD-10-CM | POA: Diagnosis not present

## 2023-04-30 DIAGNOSIS — I341 Nonrheumatic mitral (valve) prolapse: Secondary | ICD-10-CM | POA: Diagnosis not present

## 2023-04-30 NOTE — Telephone Encounter (Signed)
Pt would like to be called concerning her chest xray and EKG

## 2023-05-01 NOTE — Telephone Encounter (Signed)
Unable to leave vm due to mail box being full. Called pt to notify about chest xray results

## 2023-05-01 NOTE — Telephone Encounter (Signed)
-----   Message from [place] sent at 05/01/2023 12:35 PM EDT ----- Please call and notify - cxr - lungs are clear.

## 2023-05-01 NOTE — Telephone Encounter (Signed)
See results note. 

## 2023-05-13 DIAGNOSIS — I341 Nonrheumatic mitral (valve) prolapse: Secondary | ICD-10-CM | POA: Diagnosis not present

## 2023-05-13 DIAGNOSIS — R0609 Other forms of dyspnea: Secondary | ICD-10-CM | POA: Diagnosis not present

## 2023-05-26 ENCOUNTER — Ambulatory Visit: Payer: PPO | Admitting: Urology

## 2023-05-26 ENCOUNTER — Encounter: Payer: Self-pay | Admitting: Urology

## 2023-05-26 VITALS — BP 131/83 | HR 101 | Ht 67.0 in | Wt 177.0 lb

## 2023-05-26 DIAGNOSIS — N3946 Mixed incontinence: Secondary | ICD-10-CM | POA: Diagnosis not present

## 2023-05-26 DIAGNOSIS — N3941 Urge incontinence: Secondary | ICD-10-CM

## 2023-05-26 NOTE — Progress Notes (Signed)
05/26/2023 9:14 AM   Lynn Mann January 29, 1951 [id number]  Referring provider: Dale [place], MD [address] [place],  [place] [address]  No chief complaint on file.   HPI: SN: Mixed incontinence not interested in medication and opted for percutaneous tibial nerve stimulation.  She was given Myrbetriq and pelvic floor therapy   Patient currently primarily has foot on the floor syndrome.  It can be quite high-volume and she wears a number of pads at night.  She has rare urge incontinence during the day.  She can hold urination for 2 to 5 hours but sometimes she will have key in the door syndrome but otherwise no urge incontinence.  She voids 2-4 times a night and has intermittent ankle edema does not take a diuretic   No stress incontinence or bedwetting   Flow was good and she feels empty   Failed Myrbetriq.  Myrbetriq before COVID helped some.  She has not had a hysterectomy   On pelvic examination well supported bladder neck and negative cough test.  Grade 1 cystocele     patient clinically has an overactive bladder and likely has an element of a nocturnal diuresis.  When she holds it too long during the day she can have uncommon key in door syndrome.  Reassess for cystoscopy in 6 weeks on Gemtesa samples and prescription.  Antimuscarinics and third line therapies especially percutaneous tibial nerve stimulation may be very good options to consider based on severity of symptoms.  Botox and InterStim or other options.  I do not think at this stage she needs urodynamics   Patient can be sitting in bed and also get some urgency possibly triggered by standing.  She has gone back to physical therapy but they held it for now because she is having a little bit of lightheadedness.  This should be taken into consideration if we give her antimuscarinics.   For unclear reasons did not take all of or some of the Gemtesa samples.  Still has urge incontinence.  . On  pelvic examination well supported bladder neck and a negative cough test after cystoscopy Cystoscopy: Normal   I thought it was reasonable to try the Gemtesa again in the future may consider third line therapies and/or antimuscarinics as noted above.  She has nonspecific GI issues and I will call if culture positive.  Cannot remember why she did not take the medicine   Patient had decreased nocturia but was still symptomatic with the Gemtesa.  She started going more frequently during the day.  She still has foot on the floor syndrome and little to no urge incontinence during the day.  She did start estrogen by gynecology.  She was just diagnosed as having mild sleep apnea and is going to start CPAP.  She also had nasal stuffiness from [place].  She may have a vascular procedure for congestion syndrome that may be affecting her renal vein.  She was seen by vascular surgery    Patient understands pelvic congestion syndrome not related to her bladder dysfunction my opinion. Reassess in 4 months. I went over 3 refractory treatments with full template and handouts. Role of antimuscarinic discussed. With the 2 treatments above I thought it was best not to start another treatment yet especially with the CPAP which could help her nighttime symptoms. Assessed 4 months   Today Patient has not started CPAP or been treated by vascular surgery yet.  She no longer takes the [place].  Still has some  urgency incontinence at night as noted.  Frequency stable.  I answered multiple questions about diet and many things she found online which were not related to her problem.  Role of skilled therapy with electrical stimulation discussed again   PMH: Past Medical History:  Diagnosis Date   Arthritis    C. difficile diarrhea    age 51s-40s    Chicken pox    Cholecystitis 11/2011   Did not require sgy - Dr. Sammuel Cooper - [facility]  (cholelithiasis)   H/O Clostridium difficile infection    IBS (irritable bowel syndrome)     MRSA exposure 2005   Spider bite   MVP (mitral valve prolapse)    Stable - Dr. Lady Gary   Rheumatic fever     Surgical History: Past Surgical History:  Procedure Laterality Date   CATARACT EXTRACTION  [id number]   MUSCLE BIOPSY      Home Medications:  Allergies as of 05/26/2023       Reactions   Other Anaphylaxis   Artificial sweetener   Adhesive [tape]    Fd&c Yellow #5 (tartrazine)    Yellow Dyes (non-tartrazine) Other (See Comments)   Arthritis pains - it is tartrazine Yellow #5        Medication List        Accurate as of May 26, 2023  9:14 AM. If you have any questions, ask your nurse or doctor.          albuterol 108 (90 Base) MCG/ACT inhaler Commonly known as: VENTOLIN HFA Inhale 2 puffs into the lungs every 4 (four) hours as needed for wheezing or shortness of breath.   CALCIUM-VITAMIN D3 PO Take by mouth.   cetirizine 10 MG tablet Commonly known as: ZYRTEC Take 1 tablet (10 mg total) by mouth daily as needed for allergies.   esomeprazole 40 MG capsule Commonly known as: NEXIUM TAKE 1 CAPSULE(40 MG) BY MOUTH DAILY   estradiol 0.1 MG/GM vaginal cream Commonly known as: ESTRACE Insert fingertip unit vaginally and on urethra nightly x 2 weeks, then every other night x 2 weeks, then 2-3 times weekly for maintenance   fluticasone 50 MCG/ACT nasal spray Commonly known as: FLONASE Place 2 sprays into both nostrils daily. prn   ipratropium 0.03 % nasal spray Commonly known as: ATROVENT Place 2 sprays into both nostrils every 12 (twelve) hours.   multivitamin tablet Take 1 tablet by mouth daily.   PRESERVISION AREDS PO Take by mouth.   REFRESH OP Apply 1 drop to eye as needed.   rosuvastatin 5 MG tablet Commonly known as: CRESTOR TAKE 1 TABLET(5 MG) BY MOUTH DAILY   saccharomyces boulardii 250 MG capsule Commonly known as: Florastor Take 1 capsule (250 mg total) by mouth daily.   senna 8.6 MG Tabs tablet Commonly known as: SENOKOT Take  1 tablet by mouth daily as needed for mild constipation.   VITAMIN D3 PO Take 1 tablet by mouth daily.        Allergies:  Allergies  Allergen Reactions   Other Anaphylaxis    Artificial sweetener   Adhesive [Tape]    Fd&C Yellow #5 (Tartrazine)    Yellow Dyes (Non-Tartrazine) Other (See Comments)    Arthritis pains - it is tartrazine Yellow #5    Family History: Family History  Problem Relation Age of Onset   Arthritis Mother    Stroke Mother    Diabetes Mother    Hypertension Mother    Arthritis Father    Stroke Father  Diabetes Paternal Grandmother    Cancer Paternal Uncle        colon   Heart disease Other        maternal and paternal side   Breast cancer Paternal Aunt    Breast cancer Cousin    Breast cancer Cousin        female cousin   Breast cancer Cousin     Social History:  reports that she has never smoked. She has never been exposed to tobacco smoke. She has never used smokeless tobacco. She reports that she does not currently use alcohol. She reports that she does not use drugs.  ROS:                                        Physical Exam: There were no vitals taken for this visit.  Constitutional:  Alert and oriented, No acute distress. HEENT: [place] AT, moist mucus membranes.  Trachea midline, no masses.   Laboratory Data: Lab Results  Component Value Date   WBC 5.9 02/04/2023   HGB 13.2 02/04/2023   HCT 40.5 02/04/2023   MCV 88.5 02/04/2023   PLT 247.0 02/04/2023    Lab Results  Component Value Date   CREATININE 0.72 02/04/2023    No results found for: "PSA"  No results found for: "TESTOSTERONE"  Lab Results  Component Value Date   HGBA1C 5.4 07/15/2017    Urinalysis    Component Value Date/Time   COLORURINE YELLOW (A) 07/25/2022 1350   APPEARANCEUR Clear 01/27/2023 1007   LABSPEC 1.018 07/25/2022 1350   LABSPEC 1.015 12/17/2011 1804   PHURINE 5.0 07/25/2022 1350   GLUCOSEU Negative 01/27/2023 1007    GLUCOSEU NEGATIVE 01/02/2022 0909   HGBUR NEGATIVE 07/25/2022 1350   BILIRUBINUR Negative 01/27/2023 1007   BILIRUBINUR Negative 12/17/2011 1804   KETONESUR 5 (A) 07/25/2022 1350   PROTEINUR Negative 01/27/2023 1007   PROTEINUR NEGATIVE 07/25/2022 1350   UROBILINOGEN 0.2 01/02/2022 0909   NITRITE Negative 01/27/2023 1007   NITRITE NEGATIVE 07/25/2022 1350   LEUKOCYTESUR Negative 01/27/2023 1007   LEUKOCYTESUR NEGATIVE 07/25/2022 1350   LEUKOCYTESUR Negative 12/17/2011 1804    Pertinent Imaging:   Assessment & Plan: Patient may wish to go one of the physical therapy treatments.  Her husband died.  She is going to see physical therapy again.  She is going to likely start CPAP.  I will see her back to check on her in 6 months.  Many questions answered  There are no diagnoses linked to this encounter.  No follow-ups on file.  Martina Sinner, MD  [facility] [address], [address] [place], [place] [address] [phone]

## 2023-06-11 ENCOUNTER — Other Ambulatory Visit: Payer: Self-pay

## 2023-06-11 ENCOUNTER — Telehealth: Payer: Self-pay | Admitting: Internal Medicine

## 2023-06-11 MED ORDER — ESOMEPRAZOLE MAGNESIUM 40 MG PO CPDR: 40.0000 mg | DELAYED_RELEASE_CAPSULE | Freq: Every day | ORAL | 3 refills | Status: DC

## 2023-06-11 NOTE — Telephone Encounter (Signed)
Prescription Request  06/11/2023  LOV: 04/15/2023  What is the name of the medication or equipment? esomeprazole   Have you contacted your pharmacy to request a refill? Yes   Which pharmacy would you like this sent to? [facility]   Patient notified that their request is being sent to the clinical staff for review and that they should receive a response within 2 business days.   Please advise at Mobile [phone] (mobile)

## 2023-06-11 NOTE — Telephone Encounter (Signed)
Med refilled.

## 2023-06-11 NOTE — Telephone Encounter (Signed)
Error

## 2023-06-17 NOTE — Telephone Encounter (Signed)
Error

## 2023-07-04 ENCOUNTER — Telehealth: Payer: Self-pay

## 2023-07-04 ENCOUNTER — Ambulatory Visit (INDEPENDENT_AMBULATORY_CARE_PROVIDER_SITE_OTHER): Payer: PPO | Admitting: *Deleted

## 2023-07-04 VITALS — Ht 67.0 in | Wt 175.0 lb

## 2023-07-04 DIAGNOSIS — Z0000 Encounter for general adult medical examination without abnormal findings: Secondary | ICD-10-CM | POA: Diagnosis not present

## 2023-07-04 NOTE — Progress Notes (Signed)
Subjective:   Lynn Mann is a 72 y.o. female who presents for Medicare Annual (Subsequent) preventive examination.  Visit Complete: Virtual I connected with  Lynn Mann on 07/04/23 by a audio enabled telemedicine application and verified that I am speaking with the correct person using two identifiers.  Patient Location: Home  Provider Location: Home Office  I discussed the limitations of evaluation and management by telemedicine. The patient expressed understanding and agreed to proceed.  Vital Signs: Because this visit was a virtual/telehealth visit, some criteria may be missing or patient reported. Any vitals not documented were not able to be obtained and vitals that have been documented are patient reported.   Cardiac Risk Factors include: advanced age (>65men, >42 women);dyslipidemia;Other (see comment), Risk factor comments: MVP     Objective:    Today's Vitals   07/04/23 1123  Weight: 175 lb (79.4 kg)  Height: 5\' 7"  (1.702 m)   Body mass index is 27.41 kg/m.     07/04/2023   11:37 AM 02/14/2023    1:51 PM 09/04/2022    4:47 PM 08/02/2022    6:08 PM 07/25/2022    9:13 AM 07/01/2022    1:51 PM 02/12/2022    2:28 PM  Advanced Directives  Does Patient Have a Medical Advance Directive? No No No No No No No  Would patient like information on creating a medical advance directive? No - Patient declined   No - Patient declined No - Patient declined No - Patient declined     Current Medications (verified) Outpatient Encounter Medications as of 07/04/2023  Medication Sig   albuterol (VENTOLIN HFA) 108 (90 Base) MCG/ACT inhaler Inhale 2 puffs into the lungs every 4 (four) hours as needed for wheezing or shortness of breath.   Calcium Carbonate-Vitamin D (CALCIUM-VITAMIN D3 PO) Take by mouth.   cetirizine (ZYRTEC) 10 MG tablet Take 1 tablet (10 mg total) by mouth daily as needed for allergies.   Cholecalciferol (VITAMIN D3 PO) Take 1 tablet by mouth daily.    esomeprazole (NEXIUM) 40 MG capsule Take 1 capsule (40 mg total) by mouth daily.   estradiol (ESTRACE) 0.1 MG/GM vaginal cream Insert fingertip unit vaginally and on urethra nightly x 2 weeks, then every other night x 2 weeks, then 2-3 times weekly for maintenance   fluticasone (FLONASE) 50 MCG/ACT nasal spray Place 2 sprays into both nostrils daily. prn   ipratropium (ATROVENT) 0.03 % nasal spray Place 2 sprays into both nostrils every 12 (twelve) hours.   Multiple Vitamin (MULTIVITAMIN) tablet Take 1 tablet by mouth daily.   Multiple Vitamins-Minerals (PRESERVISION AREDS PO) Take by mouth.   Polyvinyl Alcohol-Povidone (REFRESH OP) Apply 1 drop to eye as needed.   rosuvastatin (CRESTOR) 5 MG tablet TAKE 1 TABLET(5 MG) BY MOUTH DAILY   saccharomyces boulardii (FLORASTOR) 250 MG capsule Take 1 capsule (250 mg total) by mouth daily.   senna (SENOKOT) 8.6 MG TABS tablet Take 1 tablet by mouth daily as needed for mild constipation.   No facility-administered encounter medications on file as of 07/04/2023.    Allergies (verified) Other, Adhesive [tape], Fd&c yellow #5 (tartrazine), and Yellow dyes (non-tartrazine)   History: Past Medical History:  Diagnosis Date   Arthritis    C. difficile diarrhea    age 92s-40s    Chicken pox    Cholecystitis 11/2011   Did not require sgy - Dr. Sammuel Cooper - [facility]  (cholelithiasis)   H/O Clostridium difficile infection    IBS (irritable  bowel syndrome)    MRSA exposure 2005   Spider bite   MVP (mitral valve prolapse)    Stable - Dr. Lady Gary   Rheumatic fever    Past Surgical History:  Procedure Laterality Date   CATARACT EXTRACTION  [id number]   MUSCLE BIOPSY     Family History  Problem Relation Age of Onset   Arthritis Mother    Stroke Mother    Diabetes Mother    Hypertension Mother    Arthritis Father    Stroke Father    Diabetes Paternal Grandmother    Cancer Paternal Uncle        colon   Heart disease Other        maternal and paternal side    Breast cancer Paternal Aunt    Breast cancer Cousin    Breast cancer Cousin        female cousin   Breast cancer Cousin    Social History   Socioeconomic History   Marital status: Widowed    Spouse name: Not on file   Number of children: Not on file   Years of education: 16   Highest education level: Not on file  Occupational History   Occupation: Retired  Tobacco Use   Smoking status: Never    Passive exposure: Never   Smokeless tobacco: Never  Vaping Use   Vaping status: Never Used  Substance and Sexual Activity   Alcohol use: Not Currently    Comment: Rarely   Drug use: No   Sexual activity: Yes    Partners: Male    Birth control/protection: Post-menopausal  Other Topics Concern   Not on file  Social History Narrative   Widowed    2 kids son and daughter   Social Determinants of Health   Financial Resource Strain: Low Risk  (07/04/2023)   Overall Financial Resource Strain (CARDIA)    Difficulty of Paying Living Expenses: Not hard at all  Food Insecurity: No Food Insecurity (07/04/2023)   Hunger Vital Sign    Worried About Running Out of Food in the Last Year: Never true    Ran Out of Food in the Last Year: Never true  Transportation Needs: No Transportation Needs (07/04/2023)   PRAPARE - Administrator, Civil Service (Medical): No    Lack of Transportation (Non-Medical): No  Physical Activity: Inactive (07/04/2023)   Exercise Vital Sign    Days of Exercise per Week: 0 days    Minutes of Exercise per Session: 0 min  Stress: No Stress Concern Present (07/04/2023)   [employer] of Occupational Health - Occupational Stress Questionnaire    Feeling of Stress : Not at all  Social Connections: Moderately Integrated (07/04/2023)   Social Connection and Isolation Panel [NHANES]    Frequency of Communication with Friends and Family: More than three times a week    Frequency of Social Gatherings with Friends and Family: More than three times a week     Attends Religious Services: More than 4 times per year    Active Member of [employer] or Organizations: Yes    Attends Banker Meetings: More than 4 times per year    Marital Status: Widowed    Tobacco Counseling Counseling given: Not Answered   Clinical Intake:  Pre-visit preparation completed: Yes  Pain : No/denies pain     BMI - recorded: 27.41 Nutritional Status: BMI 25 -29 Overweight Nutritional Risks: None Diabetes: No  How often do you need to have someone  help you when you read instructions, pamphlets, or other written materials from your doctor or pharmacy?: 1 - Never  Interpreter Needed?: No  Information entered by :: R. Cristiano Capri LPN   Activities of Daily Living    07/04/2023   11:25 AM  In your present state of health, do you have any difficulty performing the following activities:  Hearing? 1  Comment wears aids  Vision? 0  Comment readers  Difficulty concentrating or making decisions? 0  Walking or climbing stairs? 1  Comment stairs  Dressing or bathing? 0  Doing errands, shopping? 0  Preparing Food and eating ? N  Using the Toilet? N  In the past six months, have you accidently leaked urine? Y  Comment wears depends  Do you have problems with loss of bowel control? Y  Comment occassionally  Managing your Medications? N  Managing your Finances? N  Housekeeping or managing your Housekeeping? N    Patient Care Team: Dale [place], MD as PCP - General (Internal Medicine)  Indicate any recent Medical Services you may have received from other than [facility] providers in the past year (date may be approximate).     Assessment:   This is a routine wellness examination for [place].  Hearing/Vision screen Hearing Screening - Comments:: Wears aids Vision Screening - Comments:: readers   Goals Addressed             This Visit's Progress    Patient Stated       Wants to lose weight       Depression Screen    07/04/2023   11:32 AM  03/24/2023    4:34 PM 01/28/2023    4:02 PM 09/19/2022   10:14 AM 07/01/2022    1:52 PM 05/08/2022    3:08 PM 02/05/2022    1:26 PM  PHQ 2/9 Scores  PHQ - 2 Score 0 0 0 0 0 0 0  PHQ- 9 Score 1 2 0        Fall Risk    07/04/2023   11:28 AM 03/24/2023    4:33 PM 01/28/2023    4:02 PM 09/19/2022   10:14 AM 07/01/2022    1:47 PM  Fall Risk   Falls in the past year? 1 1 1 1 1   Number falls in past yr: 0 0 0 1 0  Injury with Fall? 1 0 0 0   Comment hit head      Risk for fall due to : History of fall(s);Impaired balance/gait History of fall(s) No Fall Risks History of fall(s)   Follow up Falls evaluation completed;Falls prevention discussed Falls evaluation completed Falls evaluation completed Falls evaluation completed Falls evaluation completed    MEDICARE RISK AT HOME: Medicare Risk at Home Any stairs in or around the home?: No If so, are there any without handrails?: No Home free of loose throw rugs in walkways, pet beds, electrical cords, etc?: Yes Adequate lighting in your home to reduce risk of falls?: Yes Life alert?: No Use of a cane, walker or w/c?: No Grab bars in the bathroom?: No Shower chair or bench in shower?: No Elevated toilet seat or a handicapped toilet?: No   Cognitive Function:        07/04/2023   11:37 AM 07/01/2022    1:51 PM  6CIT Screen  What Year? 0 points 0 points  What month? 0 points 0 points  What time? 0 points 0 points  Count back from 20 0 points 0 points  Months  in reverse 0 points 0 points  Repeat phrase 0 points 0 points  Total Score 0 points 0 points    Immunizations Immunization History  Administered Date(s) Administered   Moderna Sars-Covid-2 Vaccination 10/28/2019, 11/18/2019   Tdap 07/25/2015    TDAP status: Up to date  Flu Vaccine status: Declined, Education has been provided regarding the importance of this vaccine but patient still declined. Advised may receive this vaccine at local pharmacy or Health Dept. Aware to provide a  copy of the vaccination record if obtained from local pharmacy or Health Dept. Verbalized acceptance and understanding.  Pneumococcal vaccine status: Declined,  Education has been provided regarding the importance of this vaccine but patient still declined. Advised may receive this vaccine at local pharmacy or Health Dept. Aware to provide a copy of the vaccination record if obtained from local pharmacy or Health Dept. Verbalized acceptance and understanding.   Covid-19 vaccine status: Information provided on how to obtain vaccines.   Qualifies for Shingles Vaccine? Yes   Zostavax completed No   Shingrix Completed?: No.    Education has been provided regarding the importance of this vaccine. Patient has been advised to call insurance company to determine out of pocket expense if they have not yet received this vaccine. Advised may also receive vaccine at local pharmacy or Health Dept. Verbalized acceptance and understanding.  Screening Tests Health Maintenance  Topic Date Due   Pneumonia Vaccine 73+ Years old (1 of 1 - PCV) Never done   COVID-19 Vaccine (3 - 2023-24 season) 03/30/2023   Medicare Annual Wellness (AWV)  07/02/2023   Zoster Vaccines- Shingrix (1 of 2) 07/15/2023 (Originally 09/12/2002)   INFLUENZA VACCINE  10/27/2023 (Originally 02/27/2023)   MAMMOGRAM  04/15/2025   DTaP/Tdap/Td (2 - Td or Tdap) 07/24/2025   Colonoscopy  10/16/2032   DEXA SCAN  Completed   Hepatitis C Screening  Completed   HPV VACCINES  Aged Out    Health Maintenance  Health Maintenance Due  Topic Date Due   Pneumonia Vaccine 52+ Years old (1 of 1 - PCV) Never done   COVID-19 Vaccine (3 - 2023-24 season) 03/30/2023   Medicare Annual Wellness (AWV)  07/02/2023    Colorectal cancer screening: Type of screening: Colonoscopy. Completed 09/2022. Repeat every 10  years  Mammogram status: Completed 03/2023. Repeat every year  Bone Density status: Completed 10/2022. Results reflect: Bone density results:  OSTEOPENIA. Repeat every 2 years.  Lung Cancer Screening: (Low Dose CT Chest recommended if Age 79-80 years, 20 pack-year currently smoking OR have quit w/in 15years.) does not qualify.     Additional Screening:  Hepatitis C Screening: does qualify; Completed see note  Vision Screening: Recommended annual ophthalmology exams for early detection of glaucoma and other disorders of the eye. Is the patient up to date with their annual eye exam?  Yes  Who is the provider or what is the name of the office in which the patient attends annual eye exams? [facility] If pt is not established with a provider, would they like to be referred to a provider to establish care? No .   Dental Screening: Recommended annual dental exams for proper oral hygiene   Community Resource Referral / Chronic Care Management: CRR required this visit?  No   CCM required this visit?  No     Plan:     I have personally reviewed and noted the following in the patient's chart:   Medical and social history Use of alcohol, tobacco or illicit  drugs  Current medications and supplements including opioid prescriptions. Patient is not currently taking opioid prescriptions. Functional ability and status Nutritional status Physical activity Advanced directives List of other physicians Hospitalizations, surgeries, and ER visits in previous 12 months Vitals Screenings to include cognitive, depression, and falls Referrals and appointments  In addition, I have reviewed and discussed with patient certain preventive protocols, quality metrics, and best practice recommendations. A written personalized care plan for preventive services as well as general preventive health recommendations were provided to patient.     Sydell Axon, LPN   [id number]   After Visit Summary: (MyChart) Due to this being a telephonic visit, the after visit summary with patients personalized plan was offered to patient via MyChart   Nurse  Notes: None

## 2023-07-04 NOTE — Telephone Encounter (Signed)
Patient states she is getting emails from [employer] and she doesn't know why.  Patient states it says something about Emmi and she knows she didn't sign up for that.  I offered to give her their phone number but she wanted [place] to hear from [place] about this.  Patient states at the bottom of the email it has [employer], [address] [place], [place], [place].  Patient states she doesn't really want her information going to [place].

## 2023-07-04 NOTE — Patient Instructions (Signed)
Lynn Mann , Thank you for taking time to come for your Medicare Wellness Visit. I appreciate your ongoing commitment to your health goals. Please review the following plan we discussed and let me know if I can assist you in the future.   Referrals/Orders/Follow-Ups/Clinician Recommendations: Consider updating vaccines  This is a list of the screening recommended for you and due dates:  Health Maintenance  Topic Date Due   Pneumonia Vaccine (1 of 1 - PCV) Never done   COVID-19 Vaccine (3 - 2023-24 season) 03/30/2023   Zoster (Shingles) Vaccine (1 of 2) 07/15/2023*   Flu Shot  10/27/2023*   Medicare Annual Wellness Visit  07/03/2024   Mammogram  04/15/2025   DTaP/Tdap/Td vaccine (2 - Td or Tdap) 07/24/2025   Colon Cancer Screening  10/16/2032   DEXA scan (bone density measurement)  Completed   Hepatitis C Screening  Completed   HPV Vaccine  Aged Out  *Topic was postponed. The date shown is not the original due date.    Advanced directives: (Declined) Advance directive discussed with you today. Even though you declined this today, please call our office should you change your mind, and we can give you the proper paperwork for you to fill out.  Next Medicare Annual Wellness Visit scheduled for next year: Yes 07/06/24 @ 11:30

## 2023-07-08 NOTE — Telephone Encounter (Signed)
Advised patient that EMMI is our automated system. [employer] is part of her insurance. Advised that we have not given her medical info/sent her medical info anywhere. Pt gave verbal understanding

## 2023-07-15 DIAGNOSIS — H40013 Open angle with borderline findings, low risk, bilateral: Secondary | ICD-10-CM | POA: Diagnosis not present

## 2023-07-15 DIAGNOSIS — H5203 Hypermetropia, bilateral: Secondary | ICD-10-CM | POA: Diagnosis not present

## 2023-07-15 DIAGNOSIS — H04123 Dry eye syndrome of bilateral lacrimal glands: Secondary | ICD-10-CM | POA: Diagnosis not present

## 2023-07-15 DIAGNOSIS — H353131 Nonexudative age-related macular degeneration, bilateral, early dry stage: Secondary | ICD-10-CM | POA: Diagnosis not present

## 2023-07-15 DIAGNOSIS — H532 Diplopia: Secondary | ICD-10-CM | POA: Diagnosis not present

## 2023-08-01 ENCOUNTER — Ambulatory Visit: Payer: Self-pay | Admitting: Internal Medicine

## 2023-08-01 NOTE — Telephone Encounter (Signed)
 Copied from CRM [id number]. Topic: Clinical - Pink Word Triage >> Aug 01, 2023  7:59 AM Melissa C wrote: Reason for Triage: patient has coughing, headache, runny nose, varied low grade fever. Wants to get a COVID and RSV test as she is supposed to be around elderly relatives this weekend and wants to play it safe. However, her doctor's office doesn't have any appointments available until Monday. She is weary of going to Urgent Care as the last time she went there when she had COVID, she ended up with a stomach bug too.   Chief Complaint: Cough Symptoms:  Headaches, sinus pain, ear pressure Frequency: started 2 days ago Pertinent Negatives: Patient denies cardiac or lung history Disposition: [] ED /[] Urgent Care (no appt availability in office) / [] Appointment(In office/virtual)/ []  [facility]/ [x] Home Care/ [] Refused Recommended Disposition /[] [facility]/ []  Follow-up with PCP Additional Notes: Patient reports cough and sinus pain x 2 days. Home Care advice provided to patient per protocol. Patient expressed concern about being around elderly family members over the weekend, and would like to get seem, RN advised that next available appt is 01/06, patient states that she will go to an Urgent Care.     Reason for Disposition  Cough  Answer Assessment - Initial Assessment Questions 1. ONSET: When did the cough begin?  2 days ago  2. SEVERITY: How bad is the cough today?      *No Answer* 3. SPUTUM: Describe the color of your sputum (none, dry cough; clear, white, yellow, green)     Water sputum  4. HEMOPTYSIS: Are you coughing up any blood? If so ask: How much? (flecks, streaks, tablespoons, etc.)     No  5. DIFFICULTY BREATHING: Are you having difficulty breathing? If Yes, ask: How bad is it? (e.g., mild, moderate, severe)    - MILD: No SOB at rest, mild SOB with walking, speaks normally in sentences, can lie down, no retractions, pulse < 100.    -  MODERATE: SOB at rest, SOB with minimal exertion and prefers to sit, cannot lie down flat, speaks in phrases, mild retractions, audible wheezing, pulse 100-120.    - SEVERE: Very SOB at rest, speaks in single words, struggling to breathe, sitting hunched forward, retractions, pulse > 120      Mild shortness of breath, noticed some episodes of wheezing   6. FEVER: Do you have a fever? If Yes, ask: What is your temperature, how was it measured, and when did it start?     Low grade- 99 (axillary)  7. CARDIAC HISTORY: Do you have any history of heart disease? (e.g., heart attack, congestive heart failure)      No cardiac history  8. LUNG HISTORY: Do you have any history of lung disease?  (e.g., pulmonary embolus, asthma, emphysema)     No history   9. PE RISK FACTORS: Do you have a history of blood clots? (or: recent major surgery, recent prolonged travel, bedridden)     No PE Risk factors  10. OTHER SYMPTOMS: Do you have any other symptoms? (e.g., runny nose, wheezing, chest pain)       Headaches, sinus pain, ear pressure  11. PREGNANCY: Is there any chance you are pregnant? When was your last menstrual period?       N?A  12. TRAVEL: Have you traveled out of the country in the last month? (e.g., travel history, exposures)       No  Protocols used: Cough - Acute Productive-A-AH

## 2023-08-01 NOTE — Telephone Encounter (Signed)
 If coughing, fever, runny nose - is assumed to be contagious.  Agree with need to be seen.

## 2023-08-04 ENCOUNTER — Telehealth: Payer: Self-pay | Admitting: Internal Medicine

## 2023-08-04 DIAGNOSIS — E7800 Pure hypercholesterolemia, unspecified: Secondary | ICD-10-CM

## 2023-08-04 NOTE — Telephone Encounter (Signed)
 Patient need lab orders.

## 2023-08-04 NOTE — Telephone Encounter (Signed)
 Pt was given this advice.

## 2023-08-05 NOTE — Addendum Note (Signed)
 Addended by: Rita Ohara D on: 08/05/2023 07:31 AM   Modules accepted: Orders

## 2023-08-05 NOTE — Telephone Encounter (Signed)
Labs ordered,

## 2023-08-08 ENCOUNTER — Other Ambulatory Visit: Payer: PPO

## 2023-08-11 ENCOUNTER — Other Ambulatory Visit (INDEPENDENT_AMBULATORY_CARE_PROVIDER_SITE_OTHER): Payer: PPO

## 2023-08-11 DIAGNOSIS — E7800 Pure hypercholesterolemia, unspecified: Secondary | ICD-10-CM

## 2023-08-12 ENCOUNTER — Ambulatory Visit: Payer: PPO | Admitting: Internal Medicine

## 2023-08-12 ENCOUNTER — Encounter: Payer: Self-pay | Admitting: Internal Medicine

## 2023-08-12 VITALS — BP 110/74 | HR 86 | Temp 98.2°F | Resp 16 | Ht 67.0 in | Wt 183.0 lb

## 2023-08-12 DIAGNOSIS — M6281 Muscle weakness (generalized): Secondary | ICD-10-CM

## 2023-08-12 DIAGNOSIS — Z860100 Personal history of colon polyps, unspecified: Secondary | ICD-10-CM | POA: Diagnosis not present

## 2023-08-12 DIAGNOSIS — K219 Gastro-esophageal reflux disease without esophagitis: Secondary | ICD-10-CM | POA: Diagnosis not present

## 2023-08-12 DIAGNOSIS — R0602 Shortness of breath: Secondary | ICD-10-CM

## 2023-08-12 DIAGNOSIS — G4730 Sleep apnea, unspecified: Secondary | ICD-10-CM

## 2023-08-12 DIAGNOSIS — F439 Reaction to severe stress, unspecified: Secondary | ICD-10-CM | POA: Diagnosis not present

## 2023-08-12 DIAGNOSIS — J4520 Mild intermittent asthma, uncomplicated: Secondary | ICD-10-CM

## 2023-08-12 DIAGNOSIS — R5383 Other fatigue: Secondary | ICD-10-CM

## 2023-08-12 DIAGNOSIS — R32 Unspecified urinary incontinence: Secondary | ICD-10-CM

## 2023-08-12 DIAGNOSIS — R1084 Generalized abdominal pain: Secondary | ICD-10-CM | POA: Diagnosis not present

## 2023-08-12 DIAGNOSIS — N9489 Other specified conditions associated with female genital organs and menstrual cycle: Secondary | ICD-10-CM

## 2023-08-12 DIAGNOSIS — E7800 Pure hypercholesterolemia, unspecified: Secondary | ICD-10-CM | POA: Diagnosis not present

## 2023-08-12 LAB — BASIC METABOLIC PANEL
BUN: 11 mg/dL (ref 6–23)
CO2: 26 meq/L (ref 19–32)
Calcium: 8.9 mg/dL (ref 8.4–10.5)
Chloride: 105 meq/L (ref 96–112)
Creatinine, Ser: 0.62 mg/dL (ref 0.40–1.20)
GFR: 89.92 mL/min (ref >60.00)
Glucose, Bld: 97 mg/dL (ref 70–99)
Potassium: 4.1 meq/L (ref 3.5–5.1)
Sodium: 140 meq/L (ref 135–145)

## 2023-08-12 LAB — LIPID PANEL
Cholesterol: 168 mg/dL (ref 0–200)
HDL: 45.4 mg/dL (ref >39.00)
LDL Cholesterol: 104 mg/dL — ABNORMAL HIGH (ref 0–99)
NonHDL: 122.17
Total CHOL/HDL Ratio: 4
Triglycerides: 92 mg/dL (ref 0.0–149.0)
VLDL: 18.4 mg/dL (ref 0.0–40.0)

## 2023-08-12 LAB — HEPATIC FUNCTION PANEL
ALT: 11 U/L (ref 0–35)
AST: 17 U/L (ref 0–37)
Albumin: 4.2 g/dL (ref 3.5–5.2)
Alkaline Phosphatase: 54 U/L (ref 39–117)
Bilirubin, Direct: 0.1 mg/dL (ref 0.0–0.3)
Total Bilirubin: 0.8 mg/dL (ref 0.2–1.2)
Total Protein: 6.9 g/dL (ref 6.0–8.3)

## 2023-08-12 NOTE — Patient Instructions (Signed)
 Compression hose for swelling.  Nutrition and PT referrals placed.  CPAP will also will also help with swelling.

## 2023-08-12 NOTE — Progress Notes (Signed)
 Subjective:    Patient ID: Lynn Mann, female    DOB: April 07, 1951, 73 y.o.   MRN: [id number]  Patient here for  Chief Complaint  Patient presents with   Medical Management of Chronic Issues    1 year follow up    HPI Was scheduled for a physical exam. States gyn does her exams. Here for a f/u appt. Saw urology - recommended PFT. Reviewed note. Not taking gemtesa . Per discussion today, felt gemtesa  and myrbetriq  did not help. She previously went for PFT. Had to stop. She wants to work on exercises at home. Saw cardiology 04/30/23 - reported DOE - recommended stress echo and added toprol  XL 12.5mg  prn. Stress echo 05/13/23 - normal. Tries to stay active. Saw GI - colonoscopy and EGD unremarkable. No stenosis - SMA. Work up - vascular congestion. CTA did not show evidence of potential median arcuate ligament compression that was demonstrated potentially on ultrasound. Referred to vascular for second opinion. Saw Dr Marea. Recommended vascular procedure. Desired not to schedule at that time. Given patient continued to have postprandial abdominal pain immediately after eating and is having to liquefy foods - a HIDA scan scheduled. Gallbladder - EF - decreased. Have discussed cholecystectomy. Discussed reevaluation for gallbladder issues/removal with her today. She declines. She request referral to a nutritionist to help with her diet - given above. She is trying to incorporate more protein in her diet. Discussed need for cpap. Request PT - to help with legs - strengthening.    Past Medical History:  Diagnosis Date   Arthritis    C. difficile diarrhea    age 55s-40s    Chicken pox    Cholecystitis 11/2011   Did not require sgy - Dr. CANDIE Hood - [facility]  (cholelithiasis)   H/O Clostridium difficile infection    IBS (irritable bowel syndrome)    MRSA exposure 2005   Spider bite   MVP (mitral valve prolapse)    Stable - Dr. Bosie   Rheumatic fever    Past Surgical History:  Procedure Laterality  Date   CATARACT EXTRACTION  [id number]   MUSCLE BIOPSY     Family History  Problem Relation Age of Onset   Arthritis Mother    Stroke Mother    Diabetes Mother    Hypertension Mother    Arthritis Father    Stroke Father    Diabetes Paternal Grandmother    Cancer Paternal Uncle        colon   Heart disease Other        maternal and paternal side   Breast cancer Paternal Aunt    Breast cancer Cousin    Breast cancer Cousin        female cousin   Breast cancer Cousin    Social History   Socioeconomic History   Marital status: Widowed    Spouse name: Not on file   Number of children: Not on file   Years of education: 16   Highest education level: Not on file  Occupational History   Occupation: Retired  Tobacco Use   Smoking status: Never    Passive exposure: Never   Smokeless tobacco: Never  Vaping Use   Vaping status: Never Used  Substance and Sexual Activity   Alcohol use: Not Currently    Comment: Rarely   Drug use: No   Sexual activity: Yes    Partners: Male    Birth control/protection: Post-menopausal  Other Topics Concern   Not on file  Social History Narrative   Widowed    2 kids son and daughter   Social Drivers of Corporate Investment Banker Strain: Low Risk  (07/04/2023)   Overall Financial Resource Strain (CARDIA)    Difficulty of Paying Living Expenses: Not hard at all  Food Insecurity: No Food Insecurity (07/04/2023)   Hunger Vital Sign    Worried About Running Out of Food in the Last Year: Never true    Ran Out of Food in the Last Year: Never true  Transportation Needs: No Transportation Needs (07/04/2023)   PRAPARE - Administrator, Civil Service (Medical): No    Lack of Transportation (Non-Medical): No  Physical Activity: Inactive (07/04/2023)   Exercise Vital Sign    Days of Exercise per Week: 0 days    Minutes of Exercise per Session: 0 min  Stress: No Stress Concern Present (07/04/2023)   [employer] of Occupational Health -  Occupational Stress Questionnaire    Feeling of Stress : Not at all  Social Connections: Moderately Integrated (07/04/2023)   Social Connection and Isolation Panel [NHANES]    Frequency of Communication with Friends and Family: More than three times a week    Frequency of Social Gatherings with Friends and Family: More than three times a week    Attends Religious Services: More than 4 times per year    Active Member of [employer] or Organizations: Yes    Attends Banker Meetings: More than 4 times per year    Marital Status: Widowed     Review of Systems  Constitutional:  Negative for appetite change and unexpected weight change.       Having to liquefy foods.   HENT:  Negative for congestion and sinus pressure.   Respiratory:  Negative for cough and chest tightness.        Breathing stable.   Cardiovascular:  Negative for chest pain and palpitations.  Gastrointestinal:  Negative for abdominal pain and vomiting.  Genitourinary:  Negative for difficulty urinating and dysuria.  Musculoskeletal:  Negative for joint swelling and myalgias.  Skin:  Negative for color change and rash.  Neurological:  Negative for dizziness and headaches.  Psychiatric/Behavioral:  Negative for agitation and dysphoric mood.        Objective:     BP 110/74   Pulse 86   Temp 98.2 F (36.8 C)   Resp 16   Ht 5' 7 (1.702 m)   Wt 183 lb (83 kg)   SpO2 97%   BMI 28.66 kg/m  Wt Readings from Last 3 Encounters:  08/12/23 183 lb (83 kg)  07/04/23 175 lb (79.4 kg)  05/26/23 177 lb (80.3 kg)    Physical Exam Vitals reviewed.  Constitutional:      General: She is not in acute distress.    Appearance: Normal appearance.  HENT:     Head: Normocephalic and atraumatic.     Right Ear: External ear normal.     Left Ear: External ear normal.  Eyes:     General: No scleral icterus.       Right eye: No discharge.        Left eye: No discharge.     Conjunctiva/sclera: Conjunctivae normal.  Neck:      Thyroid : No thyromegaly.  Cardiovascular:     Rate and Rhythm: Normal rate and regular rhythm.  Pulmonary:     Effort: No respiratory distress.     Breath sounds: Normal breath sounds. No wheezing.  Abdominal:     General: Bowel sounds are normal.     Palpations: Abdomen is soft.     Tenderness: There is no abdominal tenderness.  Musculoskeletal:        General: No swelling or tenderness.     Cervical back: Neck supple. No tenderness.  Lymphadenopathy:     Cervical: No cervical adenopathy.  Skin:    Findings: No erythema or rash.  Neurological:     Mental Status: She is alert.  Psychiatric:        Mood and Affect: Mood normal.        Behavior: Behavior normal.         Outpatient Encounter Medications as of 08/12/2023  Medication Sig   albuterol  (VENTOLIN  HFA) 108 (90 Base) MCG/ACT inhaler Inhale 2 puffs into the lungs every 4 (four) hours as needed for wheezing or shortness of breath.   Calcium  Carbonate-Vitamin D  (CALCIUM -VITAMIN D3 PO) Take by mouth.   cetirizine  (ZYRTEC ) 10 MG tablet Take 1 tablet (10 mg total) by mouth daily as needed for allergies.   Cholecalciferol (VITAMIN D3 PO) Take 1 tablet by mouth daily.   esomeprazole  (NEXIUM ) 40 MG capsule Take 1 capsule (40 mg total) by mouth daily.   estradiol (ESTRACE) 0.1 MG/GM vaginal cream Insert fingertip unit vaginally and on urethra nightly x 2 weeks, then every other night x 2 weeks, then 2-3 times weekly for maintenance   fluticasone  (FLONASE ) 50 MCG/ACT nasal spray Place 2 sprays into both nostrils daily. prn   ipratropium (ATROVENT ) 0.03 % nasal spray Place 2 sprays into both nostrils every 12 (twelve) hours.   Multiple Vitamin (MULTIVITAMIN) tablet Take 1 tablet by mouth daily.   Multiple Vitamins-Minerals (PRESERVISION AREDS PO) Take by mouth.   Polyvinyl Alcohol-Povidone (REFRESH OP) Apply 1 drop to eye as needed.   rosuvastatin  (CRESTOR ) 5 MG tablet TAKE 1 TABLET(5 MG) BY MOUTH DAILY   saccharomyces  boulardii (FLORASTOR) 250 MG capsule Take 1 capsule (250 mg total) by mouth daily.   senna (SENOKOT) 8.6 MG TABS tablet Take 1 tablet by mouth daily as needed for mild constipation.   No facility-administered encounter medications on file as of 08/12/2023.     Lab Results  Component Value Date   WBC 5.8 08/12/2023   HGB 13.8 08/12/2023   HCT 42.2 08/12/2023   PLT 318.0 08/12/2023   GLUCOSE 97 08/11/2023   CHOL 168 08/11/2023   TRIG 92.0 08/11/2023   HDL 45.40 08/11/2023   LDLDIRECT 110.0 02/11/2017   LDLCALC 104 (H) 08/11/2023   ALT 11 08/11/2023   AST 17 08/11/2023   NA 140 08/11/2023   K 4.1 08/11/2023   CL 105 08/11/2023   CREATININE 0.62 08/11/2023   BUN 11 08/11/2023   CO2 26 08/11/2023   TSH 3.45 08/12/2023   HGBA1C 5.4 07/15/2017    MM 3D DIAGNOSTIC MAMMOGRAM BILATERAL BREAST Result Date: 04/16/2023 CLINICAL DATA:  Left subareolar breast pain and right axillary pain. EXAM: DIGITAL DIAGNOSTIC BILATERAL MAMMOGRAM WITH TOMOSYNTHESIS AND CAD; ULTRASOUND LEFT BREAST LIMITED; ULTRASOUND RIGHT BREAST LIMITED TECHNIQUE: Bilateral digital diagnostic mammography and breast tomosynthesis was performed. The images were evaluated with computer-aided detection. ; Targeted ultrasound examination of the left breast was performed.; Targeted ultrasound examination of the right breast was performed COMPARISON:  Previous exam(s). ACR Breast Density Category b: There are scattered areas of fibroglandular density. FINDINGS: Mammographically, there are no suspicious masses, areas of architectural distortion or microcalcifications in either breast. Targeted bilateral breast ultrasound is performed and  demonstrates no suspicious masses or shadowing lesions at the areas of pain in the right axilla, lower right breast, left breast 3 o'clock, 8 o'clock, 9 o'clock and retroareolar. IMPRESSION: No mammographic or sonographic evidence of breast malignancy. RECOMMENDATION: Further management of patient's areas  of breast pain should be based on clinical grounds. Screening mammogram in one year.(Code:SM-B-01Y) I have discussed the findings and recommendations with the patient. If applicable, a reminder letter will be sent to the patient regarding the next appointment. BI-RADS CATEGORY  1: Negative. Electronically Signed   By: Dobrinka  Dimitrova M.D.   On: 04/16/2023 11:54   US  LIMITED ULTRASOUND INCLUDING AXILLA LEFT BREAST  Result Date: 04/16/2023 CLINICAL DATA:  Left subareolar breast pain and right axillary pain. EXAM: DIGITAL DIAGNOSTIC BILATERAL MAMMOGRAM WITH TOMOSYNTHESIS AND CAD; ULTRASOUND LEFT BREAST LIMITED; ULTRASOUND RIGHT BREAST LIMITED TECHNIQUE: Bilateral digital diagnostic mammography and breast tomosynthesis was performed. The images were evaluated with computer-aided detection. ; Targeted ultrasound examination of the left breast was performed.; Targeted ultrasound examination of the right breast was performed COMPARISON:  Previous exam(s). ACR Breast Density Category b: There are scattered areas of fibroglandular density. FINDINGS: Mammographically, there are no suspicious masses, areas of architectural distortion or microcalcifications in either breast. Targeted bilateral breast ultrasound is performed and demonstrates no suspicious masses or shadowing lesions at the areas of pain in the right axilla, lower right breast, left breast 3 o'clock, 8 o'clock, 9 o'clock and retroareolar. IMPRESSION: No mammographic or sonographic evidence of breast malignancy. RECOMMENDATION: Further management of patient's areas of breast pain should be based on clinical grounds. Screening mammogram in one year.(Code:SM-B-01Y) I have discussed the findings and recommendations with the patient. If applicable, a reminder letter will be sent to the patient regarding the next appointment. BI-RADS CATEGORY  1: Negative. Electronically Signed   By: Dobrinka  Dimitrova M.D.   On: 04/16/2023 11:54   US  LIMITED ULTRASOUND  INCLUDING AXILLA RIGHT BREAST Result Date: 04/16/2023 CLINICAL DATA:  Left subareolar breast pain and right axillary pain. EXAM: DIGITAL DIAGNOSTIC BILATERAL MAMMOGRAM WITH TOMOSYNTHESIS AND CAD; ULTRASOUND LEFT BREAST LIMITED; ULTRASOUND RIGHT BREAST LIMITED TECHNIQUE: Bilateral digital diagnostic mammography and breast tomosynthesis was performed. The images were evaluated with computer-aided detection. ; Targeted ultrasound examination of the left breast was performed.; Targeted ultrasound examination of the right breast was performed COMPARISON:  Previous exam(s). ACR Breast Density Category b: There are scattered areas of fibroglandular density. FINDINGS: Mammographically, there are no suspicious masses, areas of architectural distortion or microcalcifications in either breast. Targeted bilateral breast ultrasound is performed and demonstrates no suspicious masses or shadowing lesions at the areas of pain in the right axilla, lower right breast, left breast 3 o'clock, 8 o'clock, 9 o'clock and retroareolar. IMPRESSION: No mammographic or sonographic evidence of breast malignancy. RECOMMENDATION: Further management of patient's areas of breast pain should be based on clinical grounds. Screening mammogram in one year.(Code:SM-B-01Y) I have discussed the findings and recommendations with the patient. If applicable, a reminder letter will be sent to the patient regarding the next appointment. BI-RADS CATEGORY  1: Negative. Electronically Signed   By: Dobrinka  Dimitrova M.D.   On: 04/16/2023 11:54       Assessment & Plan:  Hypercholesterolemia Assessment & Plan: Cestor.  Low cholesterol diet and exercise.  Follow lipid panel and liver function tests.   Orders: -     CBC with Differential/Platelet; Future -     Basic metabolic panel; Future -     Lipid panel; Future -  Hepatic function panel; Future -     TSH; Future  Other fatigue Assessment & Plan: Reports increased fatigue.  Had cardiac w/up  as outlined.  Negative stress echo. Check labs, including cbc, metabolic panel and tsh. Need to treat sleep apnea as outlined.   Orders: -     CBC with Differential/Platelet -     TSH -     Ambulatory referral to Physical Therapy  Urinary incontinence, unspecified type Assessment & Plan: Saw urology - recommended PFT. Reviewed note. Not taking gemtesa . Per discussion today, felt gemtesa  and myrbetriq  did not help. She previously went for PFT. Had to stop. She wants to work on exercises at home.   Stress Assessment & Plan: Overall appears to be handling things relatively well.  Follow.    SOB (shortness of breath) Assessment & Plan: Saw cardiology 04/30/23 - reported DOE - recommended stress echo and added toprol  XL 12.5mg  prn. Stress echo 05/13/23 - normal.    Sleep apnea, unspecified type Assessment & Plan: Was evaluated by pulmonary. Sleep study - mild sleep apnea. Discussed cpap. She will call pulmonary regarding cpap.    Pelvic congestive syndrome Assessment & Plan: colonoscopy and EGD unremarkable. No stenosis - SMA. Work up - vascular congestion. CTA did not show evidence of potential median arcuate ligament compression that was demonstrated potentially on ultrasound. Referred to vascular for second opinion. Saw Dr Marea. Planned procedure - pelvic venous embolization with likely renal angioplasty. Discussed. Desires not to schedule.    Muscle weakness Assessment & Plan: Leg weakness - discussed. PT to evaluate and treat.   Orders: -     Ambulatory referral to Physical Therapy  History of colonic polyps Assessment & Plan: Colonoscopy 09/2022 - unremarkable.    Gastroesophageal reflux disease, unspecified whether esophagitis present Assessment & Plan: Continue PPI.    Mild intermittent asthma without complication Assessment & Plan: Breathing overall appears to be stable.  Lungs clear.     Generalized abdominal pain Assessment & Plan: EGD and colonoscopy 09/2022  - unremarkable.  F/u with GI 10/31/22 - recommended mesenteric dopplers - Increased peak systolic velocities in the celiac access during expiration suggests median arcuate ligament compression physiology. No evidence of hemodynamically significant stenosis within the SMA. Bentyl .  Continue nexium .  Had gyn exam 11/20/22 - pap. Started on vaginal estrogen.  CTA - Widely patent celiac axis, SMA and IMA. No CTA evidence of vascular compromise to bowel.  Acute aorto-mesenteric angle with slit-like appearance of the LEFT renal vein, and dilated LEFT gonadal vein. In combination with dilated periuterine veins, findings as can be seen in pelvic congestion. Correlate with potential symptomatology. HIDA - reduced GB EF.  Discussed above findings and pelvic congestion.  She was evaluated by vascular surgery.  Planned procedure as outlined.  Wants to hold at this time.  Also discussed symptoms and reduced GB EF.  She declines referral to surgery to discuss possible cholecystectomy.  She is still eating - mostly pureed food. She request referral to nutritionist to discuss her diet, restrictions and recommendations.   Orders: -     Amb ref to Medical Nutrition Therapy-MNT     Allena Hamilton, MD

## 2023-08-13 LAB — CBC WITH DIFFERENTIAL/PLATELET
Basophils Absolute: 0.1 10*3/uL (ref 0.0–0.1)
Basophils Relative: 1.3 % (ref 0.0–3.0)
Eosinophils Absolute: 0 10*3/uL (ref 0.0–0.7)
Eosinophils Relative: 0.3 % (ref 0.0–5.0)
HCT: 42.2 % (ref 36.0–46.0)
Hemoglobin: 13.8 g/dL (ref 12.0–15.0)
Lymphocytes Relative: 31.8 % (ref 12.0–46.0)
Lymphs Abs: 1.8 10*3/uL (ref 0.7–4.0)
MCHC: 32.6 g/dL (ref 30.0–36.0)
MCV: 89.7 fL (ref 78.0–100.0)
Monocytes Absolute: 0.4 10*3/uL (ref 0.1–1.0)
Monocytes Relative: 6.7 % (ref 3.0–12.0)
Neutro Abs: 3.5 10*3/uL (ref 1.4–7.7)
Neutrophils Relative %: 59.9 % (ref 43.0–77.0)
Platelets: 318 10*3/uL (ref 150.0–400.0)
RBC: 4.71 Mil/uL (ref 3.87–5.11)
RDW: 13.7 % (ref 11.5–15.5)
WBC: 5.8 10*3/uL (ref 4.0–10.5)

## 2023-08-13 LAB — TSH: TSH: 3.45 u[IU]/mL (ref 0.35–5.50)

## 2023-08-14 ENCOUNTER — Telehealth: Payer: Self-pay | Admitting: Urology

## 2023-08-14 DIAGNOSIS — N3941 Urge incontinence: Secondary | ICD-10-CM

## 2023-08-14 DIAGNOSIS — N3281 Overactive bladder: Secondary | ICD-10-CM

## 2023-08-14 NOTE — Telephone Encounter (Signed)
Pt called wanting to know if referral was sent for therapy that provider had suggested. Pt last seen provider on 05/26/23. Please advise patient.

## 2023-08-15 NOTE — Telephone Encounter (Signed)
Not sure which physical therapy she is referring to, but pt states it's not pelvic floor physical therapy. Can you help?

## 2023-08-17 ENCOUNTER — Encounter: Payer: Self-pay | Admitting: Internal Medicine

## 2023-08-17 NOTE — Assessment & Plan Note (Signed)
Breathing overall appears to be stable.  Lungs clear.   

## 2023-08-17 NOTE — Assessment & Plan Note (Signed)
Colonoscopy 09/2022 - unremarkable.

## 2023-08-17 NOTE — Assessment & Plan Note (Signed)
Was evaluated by pulmonary. Sleep study - mild sleep apnea. Discussed cpap. She will call pulmonary regarding cpap.

## 2023-08-17 NOTE — Assessment & Plan Note (Signed)
Overall appears to be handling things relatively well.  Follow.   

## 2023-08-17 NOTE — Assessment & Plan Note (Signed)
Reports increased fatigue.  Had cardiac w/up as outlined.  Negative stress echo. Check labs, including cbc, metabolic panel and tsh. Need to treat sleep apnea as outlined.

## 2023-08-17 NOTE — Assessment & Plan Note (Signed)
EGD and colonoscopy 09/2022 - unremarkable.  F/u with GI 10/31/22 - recommended mesenteric dopplers - Increased peak systolic velocities in the celiac access during expiration suggests median arcuate ligament compression physiology. No evidence of hemodynamically significant stenosis within the SMA. Bentyl.  Continue nexium.  Had gyn exam 11/20/22 - pap. Started on vaginal estrogen.  CTA - Widely patent celiac axis, SMA and IMA. No CTA evidence of vascular compromise to bowel.  Acute aorto-mesenteric angle with slit-like appearance of the LEFT renal vein, and dilated LEFT gonadal vein. In combination with dilated periuterine veins, findings as can be seen in pelvic congestion. Correlate with potential symptomatology. HIDA - reduced GB EF.  Discussed above findings and pelvic congestion.  She was evaluated by vascular surgery.  Planned procedure as outlined.  Wants to hold at this time.  Also discussed symptoms and reduced GB EF.  She declines referral to surgery to discuss possible cholecystectomy.  She is still eating - mostly pureed food. She request referral to nutritionist to discuss her diet, restrictions and recommendations.

## 2023-08-17 NOTE — Assessment & Plan Note (Signed)
Leg weakness - discussed. PT to evaluate and treat.

## 2023-08-17 NOTE — Assessment & Plan Note (Signed)
Continue PPI ?

## 2023-08-17 NOTE — Assessment & Plan Note (Addendum)
colonoscopy and EGD unremarkable. No stenosis - SMA. Work up - vascular congestion. CTA did not show evidence of potential median arcuate ligament compression that was demonstrated potentially on ultrasound. Referred to vascular for second opinion. Saw Dr Wyn Quaker. Planned procedure - pelvic venous embolization with likely renal angioplasty. Discussed. Desires not to schedule.

## 2023-08-17 NOTE — Assessment & Plan Note (Signed)
Saw urology - recommended PFT. Reviewed note. Not taking gemtesa. Per discussion today, felt gemtesa and myrbetriq did not help. She previously went for PFT. Had to stop. She wants to work on exercises at home.

## 2023-08-17 NOTE — Assessment & Plan Note (Signed)
Saw cardiology 04/30/23 - reported DOE - recommended stress echo and added toprol XL 12.5mg  prn. Stress echo 05/13/23 - normal.

## 2023-08-17 NOTE — Assessment & Plan Note (Signed)
Cestor.  Low cholesterol diet and exercise.  Follow lipid panel and liver function tests.

## 2023-08-18 NOTE — Telephone Encounter (Signed)
Spoke with patient and advised results Referral entered

## 2023-10-01 ENCOUNTER — Ambulatory Visit: Payer: PPO | Admitting: Physical Therapy

## 2023-10-06 ENCOUNTER — Encounter: Payer: Self-pay | Admitting: Physical Therapy

## 2023-10-06 ENCOUNTER — Ambulatory Visit: Payer: PPO | Attending: Internal Medicine | Admitting: Physical Therapy

## 2023-10-06 DIAGNOSIS — R5382 Chronic fatigue, unspecified: Secondary | ICD-10-CM | POA: Insufficient documentation

## 2023-10-06 DIAGNOSIS — R269 Unspecified abnormalities of gait and mobility: Secondary | ICD-10-CM | POA: Diagnosis not present

## 2023-10-06 DIAGNOSIS — R2689 Other abnormalities of gait and mobility: Secondary | ICD-10-CM | POA: Insufficient documentation

## 2023-10-06 DIAGNOSIS — R5383 Other fatigue: Secondary | ICD-10-CM | POA: Diagnosis not present

## 2023-10-06 DIAGNOSIS — R278 Other lack of coordination: Secondary | ICD-10-CM | POA: Insufficient documentation

## 2023-10-06 DIAGNOSIS — M6281 Muscle weakness (generalized): Secondary | ICD-10-CM | POA: Diagnosis not present

## 2023-10-06 NOTE — Therapy (Signed)
 OUTPATIENT PHYSICAL THERAPY LOWER EXTREMITY EVALUATION   Patient Name: Lynn Mann MRN: [id number] DOB:Nov 11, 1950, 73 y.o., female Today's Date: 10/06/2023  END OF SESSION:  PT End of Session - 10/06/23 1035     Visit Number 1    Number of Visits 12    Date for PT Re-Evaluation 11/17/23    PT Start Time 1033    PT Stop Time 1116    PT Time Calculation (min) 43 min            Past Medical History:  Diagnosis Date   Arthritis    C. difficile diarrhea    age 6s-40s    Chicken pox    Cholecystitis 11/2011   Did not require sgy - Dr. Sammuel Cooper - [facility]  (cholelithiasis)   H/O Clostridium difficile infection    IBS (irritable bowel syndrome)    MRSA exposure 2005   Spider bite   MVP (mitral valve prolapse)    Stable - Dr. Lady Gary   Rheumatic fever    Past Surgical History:  Procedure Laterality Date   CATARACT EXTRACTION  [id number]   MUSCLE BIOPSY     Patient Active Problem List   Diagnosis Date Noted   Pelvic congestive syndrome 01/14/2023   Estrogen deficiency 06/23/2022   Nocturia 03/12/2022   Sleep apnea 03/12/2022   Muscle weakness 03/11/2022   Joint ache 02/11/2022   Ankle pain 02/05/2022   Fever 12/26/2021   Abdominal pain 12/26/2021   Cough 09/07/2021   Right shoulder pain 07/15/2021   Hematoma 02/11/2021   Ankle swelling 11/26/2020   Low back pain 11/22/2020   Hip pain, right 11/22/2020   Light headedness 06/24/2020   Hearing loss 06/24/2020   Binocular vision disorder with diplopia 06/24/2020   Stress 05/17/2020   Chest pain 05/16/2020   Welcome to Medicare preventive visit 06/20/2019   Viral syndrome 01/30/2019   Itching 07/21/2017   Asthma 07/16/2016   Urinary incontinence 07/16/2016   SOB (shortness of breath) 04/01/2016   Bilateral shoulder pain 02/24/2016   Fatigue 01/28/2016   Neck fullness 01/28/2016   Routine general medical examination at a health care facility 07/25/2015   Health care maintenance 10/09/2014   Neck pain 11/26/2013    Hypercholesterolemia 11/26/2013   History of colonic polyps 05/11/2013   GERD (gastroesophageal reflux disease) 12/03/2012   Cholelithiasis 11/07/2012   IBS (irritable bowel syndrome) 11/07/2012   History of rheumatic fever 08/28/2012   MVP (mitral valve prolapse) 08/28/2012    PCP: Dale Taylor, MD  REFERRING PROVIDER: Dale Maumelle, MD  REFERRING DIAG:    R53.83 (ICD-10-CM) - Other fatigue  M62.81 (ICD-10-CM) - Muscle weakness    THERAPY DIAG:  Muscle weakness (generalized)  Gait difficulty  Chronic fatigue  Rationale for Evaluation and Treatment: Rehabilitation  ONSET DATE: chronic (>1 year)  SUBJECTIVE:   SUBJECTIVE STATEMENT: Got Covid 5 days before Christmas in 2023 and reports several complications (Gastritis/ Esophagitis/ 1 hiatal hernia and 1 umbilical hernia/ GERD).  Pt. Reports she is tired and out of shape.  Pt. Reports she has no endurance and requires seated rest breaks.  Pt. States she has pelvic congestion and feels weak in core.    PERTINENT HISTORY: Enjoys making vision boards.   Pt. Is busy with maintaining household chores.  Goes to meeting at [employer] center and [employer].  Pt. Belongs to the [facility].    Expand All Collapse All    Subjective:    Subjective Patient ID: Lynn Mann, female  DOB: 15-Feb-1951, 73 y.o.   MRN: [id number]   Patient here for      Chief Complaint  Patient presents with   Medical Management of Chronic Issues      1 year follow up      HPI Was scheduled for a physical exam. States gyn does her exams. Here for a f/u appt. Saw urology - recommended PFT. Reviewed note. Not taking gemtesa. Per discussion today, felt gemtesa and myrbetriq did not help. She previously went for PFT. Had to stop. She wants to work on exercises at home. Saw cardiology 04/30/23 - reported DOE - recommended stress echo and added toprol XL 12.5mg  prn. Stress echo 05/13/23 - normal. Tries to stay active. Saw GI - colonoscopy and EGD  unremarkable. No stenosis - SMA. Work up - vascular congestion. CTA did not show evidence of potential median arcuate ligament compression that was demonstrated potentially on ultrasound. Referred to vascular for second opinion. Saw Dr Wyn Quaker. Recommended vascular procedure. Desired not to schedule at that time. Given patient continued to have postprandial abdominal pain immediately after eating and is having to liquefy foods - a HIDA scan scheduled. Gallbladder - EF - decreased. Have discussed cholecystectomy. Discussed reevaluation for gallbladder issues/removal with her today. She declines. She request referral to a nutritionist to help with her diet - given above. She is trying to incorporate more protein in her diet. Discussed need for cpap. Request PT - to help with legs - strengthening.          Past Medical History:  Diagnosis Date   Arthritis     C. difficile diarrhea      age 6s-40s    Chicken pox     Cholecystitis 11/2011    Did not require sgy - Dr. Sammuel Cooper - [facility]  (cholelithiasis)   H/O Clostridium difficile infection     IBS (irritable bowel syndrome)     MRSA exposure 2005    Spider bite   MVP (mitral valve prolapse)      Stable - Dr. Lady Gary   Rheumatic fever               Past Surgical History:  Procedure Laterality Date   CATARACT EXTRACTION   [id number]   MUSCLE BIOPSY                 Family History  Problem Relation Age of Onset   Arthritis Mother     Stroke Mother     Diabetes Mother     Hypertension Mother     Arthritis Father     Stroke Father     Diabetes Paternal Grandmother     Cancer Paternal Uncle          colon   Heart disease Other          maternal and paternal side   Breast cancer Paternal Aunt     Breast cancer Cousin     Breast cancer Cousin          female cousin   Breast cancer Cousin          Social History         Socioeconomic History   Marital status: Widowed      Spouse name: Not on file   Number of children: Not on file    Years of education: 16   Highest education level: Not on file  Occupational History   Occupation: Retired  Tobacco Use   Smoking status: Never  Passive exposure: Never   Smokeless tobacco: Never  Vaping Use   Vaping status: Never Used  Substance and Sexual Activity   Alcohol use: Not Currently      Comment: Rarely   Drug use: No   Sexual activity: Yes      Partners: Male      Birth control/protection: Post-menopausal  Other Topics Concern   Not on file  Social History Narrative    Widowed     2 kids son and daughter    Social Drivers of Acupuncturist Strain: Low Risk  (07/04/2023)    Overall Financial Resource Strain (CARDIA)     Difficulty of Paying Living Expenses: Not hard at all  Food Insecurity: No Food Insecurity (07/04/2023)    Hunger Vital Sign     Worried About Running Out of Food in the Last Year: Never true     Ran Out of Food in the Last Year: Never true  Transportation Needs: No Transportation Needs (07/04/2023)    PRAPARE - Therapist, art (Medical): No     Lack of Transportation (Non-Medical): No  Physical Activity: Inactive (07/04/2023)    Exercise Vital Sign     Days of Exercise per Week: 0 days     Minutes of Exercise per Session: 0 min  Stress: No Stress Concern Present (07/04/2023)    [employer] of Occupational Health - Occupational Stress Questionnaire     Feeling of Stress : Not at all  Social Connections: Moderately Integrated (07/04/2023)    Social Connection and Isolation Panel [NHANES]     Frequency of Communication with Friends and Family: More than three times a week     Frequency of Social Gatherings with Friends and Family: More than three times a week     Attends Religious Services: More than 4 times per year     Active Member of [employer] or Organizations: Yes     Attends Banker Meetings: More than 4 times per year     Marital Status: Widowed    Review of Systems   Constitutional:  Negative for appetite change and unexpected weight change.       Having to liquefy foods.   HENT:  Negative for congestion and sinus pressure.   Respiratory:  Negative for cough and chest tightness.        Breathing stable.   Cardiovascular:  Negative for chest pain and palpitations.  Gastrointestinal:  Negative for abdominal pain and vomiting.  Genitourinary:  Negative for difficulty urinating and dysuria.  Musculoskeletal:  Negative for joint swelling and myalgias.  Skin:  Negative for color change and rash.  Neurological:  Negative for dizziness and headaches.  Psychiatric/Behavioral:  Negative for agitation and dysphoric mood.     Physical Exam Vitals reviewed.  Constitutional:      General: She is not in acute distress.    Appearance: Normal appearance.  HENT:     Head: Normocephalic and atraumatic.     Right Ear: External ear normal.     Left Ear: External ear normal.  Eyes:     General: No scleral icterus.       Right eye: No discharge.        Left eye: No discharge.     Conjunctiva/sclera: Conjunctivae normal.  Neck:     Thyroid: No thyromegaly.  Cardiovascular:     Rate and Rhythm: Normal rate and regular rhythm.  Pulmonary:  Effort: No respiratory distress.     Breath sounds: Normal breath sounds. No wheezing.  Abdominal:     General: Bowel sounds are normal.     Palpations: Abdomen is soft.     Tenderness: There is no abdominal tenderness.  Musculoskeletal:        General: No swelling or tenderness.     Cervical back: Neck supple. No tenderness.  Lymphadenopathy:     Cervical: No cervical adenopathy.  Skin:    Findings: No erythema or rash.  Neurological:     Mental Status: She is alert.  Psychiatric:        Mood and Affect: Mood normal.        Behavior: Behavior normal.                  Outpatient Encounter Medications as of 08/12/2023  Medication Sig   albuterol (VENTOLIN HFA) 108 (90 Base) MCG/ACT inhaler Inhale 2 puffs  into the lungs every 4 (four) hours as needed for wheezing or shortness of breath.   Calcium Carbonate-Vitamin D (CALCIUM-VITAMIN D3 PO) Take by mouth.   cetirizine (ZYRTEC) 10 MG tablet Take 1 tablet (10 mg total) by mouth daily as needed for allergies.   Cholecalciferol (VITAMIN D3 PO) Take 1 tablet by mouth daily.   esomeprazole (NEXIUM) 40 MG capsule Take 1 capsule (40 mg total) by mouth daily.   estradiol (ESTRACE) 0.1 MG/GM vaginal cream Insert fingertip unit vaginally and on urethra nightly x 2 weeks, then every other night x 2 weeks, then 2-3 times weekly for maintenance   fluticasone (FLONASE) 50 MCG/ACT nasal spray Place 2 sprays into both nostrils daily. prn   ipratropium (ATROVENT) 0.03 % nasal spray Place 2 sprays into both nostrils every 12 (twelve) hours.   Multiple Vitamin (MULTIVITAMIN) tablet Take 1 tablet by mouth daily.   Multiple Vitamins-Minerals (PRESERVISION AREDS PO) Take by mouth.   Polyvinyl Alcohol-Povidone (REFRESH OP) Apply 1 drop to eye as needed.   rosuvastatin (CRESTOR) 5 MG tablet TAKE 1 TABLET(5 MG) BY MOUTH DAILY   saccharomyces boulardii (FLORASTOR) 250 MG capsule Take 1 capsule (250 mg total) by mouth daily.   senna (SENOKOT) 8.6 MG TABS tablet Take 1 tablet by mouth daily as needed for mild constipation.      No facility-administered encounter medications on file as of 08/12/2023.        Recent Labs       Lab Results  Component Value Date    WBC 5.8 08/12/2023    HGB 13.8 08/12/2023    HCT 42.2 08/12/2023    PLT 318.0 08/12/2023    GLUCOSE 97 08/11/2023    CHOL 168 08/11/2023    TRIG 92.0 08/11/2023    HDL 45.40 08/11/2023    LDLDIRECT 110.0 02/11/2017    LDLCALC 104 (H) 08/11/2023    ALT 11 08/11/2023    AST 17 08/11/2023    NA 140 08/11/2023    K 4.1 08/11/2023    CL 105 08/11/2023    CREATININE 0.62 08/11/2023    BUN 11 08/11/2023    CO2 26 08/11/2023    TSH 3.45 08/12/2023    HGBA1C 5.4 07/15/2017        MM 3D DIAGNOSTIC  MAMMOGRAM BILATERAL BREAST Result Date: 04/16/2023 CLINICAL DATA:  Left subareolar breast pain and right axillary pain. EXAM: DIGITAL DIAGNOSTIC BILATERAL MAMMOGRAM WITH TOMOSYNTHESIS AND CAD; ULTRASOUND LEFT BREAST LIMITED; ULTRASOUND RIGHT BREAST LIMITED TECHNIQUE: Bilateral digital diagnostic mammography and breast tomosynthesis was performed. The  images were evaluated with computer-aided detection. ; Targeted ultrasound examination of the left breast was performed.; Targeted ultrasound examination of the right breast was performed COMPARISON:  Previous exam(s). ACR Breast Density Category b: There are scattered areas of fibroglandular density. FINDINGS: Mammographically, there are no suspicious masses, areas of architectural distortion or microcalcifications in either breast. Targeted bilateral breast ultrasound is performed and demonstrates no suspicious masses or shadowing lesions at the areas of pain in the right axilla, lower right breast, left breast 3 o'clock, 8 o'clock, 9 o'clock and retroareolar. IMPRESSION: No mammographic or sonographic evidence of breast malignancy. RECOMMENDATION: Further management of patient's areas of breast pain should be based on clinical grounds. Screening mammogram in one year.(Code:SM-B-01Y) I have discussed the findings and recommendations with the patient. If applicable, a reminder letter will be sent to the patient regarding the next appointment. BI-RADS CATEGORY  1: Negative. Electronically Signed   By: Ted Mcalpine M.D.   On: 04/16/2023 11:54    [place] LIMITED ULTRASOUND INCLUDING AXILLA LEFT BREAST  Result Date: 04/16/2023 CLINICAL DATA:  Left subareolar breast pain and right axillary pain. EXAM: DIGITAL DIAGNOSTIC BILATERAL MAMMOGRAM WITH TOMOSYNTHESIS AND CAD; ULTRASOUND LEFT BREAST LIMITED; ULTRASOUND RIGHT BREAST LIMITED TECHNIQUE: Bilateral digital diagnostic mammography and breast tomosynthesis was performed. The images were evaluated with computer-aided  detection. ; Targeted ultrasound examination of the left breast was performed.; Targeted ultrasound examination of the right breast was performed COMPARISON:  Previous exam(s). ACR Breast Density Category b: There are scattered areas of fibroglandular density. FINDINGS: Mammographically, there are no suspicious masses, areas of architectural distortion or microcalcifications in either breast. Targeted bilateral breast ultrasound is performed and demonstrates no suspicious masses or shadowing lesions at the areas of pain in the right axilla, lower right breast, left breast 3 o'clock, 8 o'clock, 9 o'clock and retroareolar. IMPRESSION: No mammographic or sonographic evidence of breast malignancy. RECOMMENDATION: Further management of patient's areas of breast pain should be based on clinical grounds. Screening mammogram in one year.(Code:SM-B-01Y) I have discussed the findings and recommendations with the patient. If applicable, a reminder letter will be sent to the patient regarding the next appointment. BI-RADS CATEGORY  1: Negative. Electronically Signed   By: Ted Mcalpine M.D.   On: 04/16/2023 11:54    [place] LIMITED ULTRASOUND INCLUDING AXILLA RIGHT BREAST Result Date: 04/16/2023 CLINICAL DATA:  Left subareolar breast pain and right axillary pain. EXAM: DIGITAL DIAGNOSTIC BILATERAL MAMMOGRAM WITH TOMOSYNTHESIS AND CAD; ULTRASOUND LEFT BREAST LIMITED; ULTRASOUND RIGHT BREAST LIMITED TECHNIQUE: Bilateral digital diagnostic mammography and breast tomosynthesis was performed. The images were evaluated with computer-aided detection. ; Targeted ultrasound examination of the left breast was performed.; Targeted ultrasound examination of the right breast was performed COMPARISON:  Previous exam(s). ACR Breast Density Category b: There are scattered areas of fibroglandular density. FINDINGS: Mammographically, there are no suspicious masses, areas of architectural distortion or microcalcifications in either breast.  Targeted bilateral breast ultrasound is performed and demonstrates no suspicious masses or shadowing lesions at the areas of pain in the right axilla, lower right breast, left breast 3 o'clock, 8 o'clock, 9 o'clock and retroareolar. IMPRESSION: No mammographic or sonographic evidence of breast malignancy. RECOMMENDATION: Further management of patient's areas of breast pain should be based on clinical grounds. Screening mammogram in one year.(Code:SM-B-01Y) I have discussed the findings and recommendations with the patient. If applicable, a reminder letter will be sent to the patient regarding the next appointment. BI-RADS CATEGORY  1: Negative. Electronically Signed   By: Ulanda Edison.D.  On: 04/16/2023 11:54          Assessment & Plan:  Hypercholesterolemia Assessment & Plan: Cestor.  Low cholesterol diet and exercise.  Follow lipid panel and liver function tests.    Orders: -     CBC with Differential/Platelet; Future -     Basic metabolic panel; Future -     Lipid panel; Future -     Hepatic function panel; Future -     TSH; Future   Other fatigue Assessment & Plan: Reports increased fatigue.  Had cardiac w/up as outlined.  Negative stress echo. Check labs, including cbc, metabolic panel and tsh. Need to treat sleep apnea as outlined.    Orders: -     CBC with Differential/Platelet -     TSH -     Ambulatory referral to Physical Therapy   Urinary incontinence, unspecified type Assessment & Plan: Saw urology - recommended PFT. Reviewed note. Not taking gemtesa. Per discussion today, felt gemtesa and myrbetriq did not help. She previously went for PFT. Had to stop. She wants to work on exercises at home.     Stress Assessment & Plan: Overall appears to be handling things relatively well.  Follow.      SOB (shortness of breath) Assessment & Plan: Saw cardiology 04/30/23 - reported DOE - recommended stress echo and added toprol XL 12.5mg  prn. Stress echo 05/13/23 -  normal.      Sleep apnea, unspecified type Assessment & Plan: Was evaluated by pulmonary. Sleep study - mild sleep apnea. Discussed cpap. She will call pulmonary regarding cpap.   Pelvic congestive syndrome Assessment & Plan: colonoscopy and EGD unremarkable. No stenosis - SMA. Work up - vascular congestion. CTA did not show evidence of potential median arcuate ligament compression that was demonstrated potentially on ultrasound. Referred to vascular for second opinion. Saw Dr Wyn Quaker. Planned procedure - pelvic venous embolization with likely renal angioplasty. Discussed. Desires not to schedule.      Muscle weakness Assessment & Plan: Leg weakness - discussed. PT to evaluate and treat.    Orders: -     Ambulatory referral to Physical Therapy   History of colonic polyps Assessment & Plan: Colonoscopy 09/2022 - unremarkable.      Gastroesophageal reflux disease, unspecified whether esophagitis present Assessment & Plan: Continue PPI.      Mild intermittent asthma without complication Assessment & Plan: Breathing overall appears to be stable.  Lungs clear.     Generalized abdominal pain Assessment & Plan: EGD and colonoscopy 09/2022 - unremarkable.  F/u with GI 10/31/22 - recommended mesenteric dopplers - Increased peak systolic velocities in the celiac access during expiration suggests median arcuate ligament compression physiology. No evidence of hemodynamically significant stenosis within the SMA. Bentyl.  Continue nexium.  Had gyn exam 11/20/22 - pap. Started on vaginal estrogen.  CTA - Widely patent celiac axis, SMA and IMA. No CTA evidence of vascular compromise to bowel.  Acute aorto-mesenteric angle with slit-like appearance of the LEFT renal vein, and dilated LEFT gonadal vein. In combination with dilated periuterine veins, findings as can be seen in pelvic congestion. Correlate with potential symptomatology. HIDA - reduced GB EF.  Discussed above findings and pelvic congestion.   She was evaluated by vascular surgery.  Planned procedure as outlined.  Wants to hold at this time.  Also discussed symptoms and reduced GB EF.  She declines referral to surgery to discuss possible cholecystectomy.  She is still eating - mostly pureed food. She request referral to  nutritionist to discuss her diet, restrictions and recommendations.    Orders: -     Amb ref to Medical Nutrition Therapy-MNT      PAIN:  Are you having pain? No  PRECAUTIONS: None  RED FLAGS: None   WEIGHT BEARING RESTRICTIONS: No  FALLS:  Has patient fallen in last 6 months? No  LIVING ENVIRONMENT: Lives with: lives alone Lives in: House/apartment Stairs: Yes: External: 1 steps; none Has following equipment at home: None  OCCUPATION: Retired  PLOF: Independent  PATIENT GOALS: Increase LE and core muscle i and improve overall energy.    NEXT MD VISIT: PRN (blood work in April).    OBJECTIVE:  Note: Objective measures were completed at Evaluation unless otherwise noted.  DIAGNOSTIC FINDINGS: N/A  PATIENT SURVEYS:  LEFS TBD next tx. session  COGNITION: Overall cognitive status: Within functional limits for tasks assessed    SENSATION: WFL  EDEMA:  NT  MUSCLE LENGTH: Hamstrings: Right 80 deg; Left 80 deg Thomas test: NT  POSTURE: rounded shoulders and forward head  PALPATION: NT  LOWER EXTREMITY ROM:  Active ROM Right eval Left eval  Hip flexion [facility]  Hip extension    Hip abduction    Hip adduction    Hip internal rotation    Hip external rotation    Knee flexion [facility]  Knee extension [facility]  Ankle dorsiflexion [facility] WFL  Ankle plantarflexion [facility] WFL  Ankle inversion    Ankle eversion     (Blank rows = not tested)  LOWER EXTREMITY MMT:  MMT Right eval Left eval  Hip flexion 4- 4-  Hip extension    Hip abduction 4 4  Hip adduction    Hip internal rotation    Hip external rotation    Knee flexion 4+ 4+  Knee extension 5 5  Ankle dorsiflexion 4 4   Ankle plantarflexion 4 4  Ankle inversion    Ankle eversion     (Blank rows = not tested)  L anterior thigh discomfort with bridging.    LOWER EXTREMITY SPECIAL TESTS:  NT  FUNCTIONAL TESTS:  5 times sit to stand:    Completed 3.5x with no UE assist.  19.22 seconds.    GAIT: Distance walked: in clinic Assistive device utilized: None Level of assistance: Complete Independence Comments: Forward posture with recip. Gait pattern.  Limited arm swing noted.  Consistent step length/ BOS noted.   Prolonged standing will increase nausea (>10 minutes)- no known cause.                                                                                                                                TREATMENT DATE: 10/06/23   See evaluation  Amb. In hallway (increase L hip height).  H/o scoliosis.  O2 sat:  97%,  HR 108 bpm.    Nustep (seat #10 at L2)- B UE/LE for 10 min.    PATIENT EDUCATION:  Education  details: POC/ endurance training Person educated: Patient Education method: Explanation and Demonstration Education comprehension: verbalized understanding and returned demonstration  HOME EXERCISE PROGRAM: Will issue LE HEP next tx. session  ASSESSMENT:  CLINICAL IMPRESSION: Patient is a pleasant 73 y.o. female who was seen today for physical therapy evaluation and treatment for LE muscle weakness.  Pt. Presents with generalized B LE/ core muscle weakness..  Pt. Presents with limited overall endurance with daily tasks/ standing and motivated to develop a strengthening ex. Program.  Pt. Currently not exercising and will benefit from skilled PT services to increase B LE muscle strength to improve overall endurance with daily household/ community tasks.     OBJECTIVE IMPAIRMENTS: decreased activity tolerance, decreased balance, decreased endurance, decreased mobility, difficulty walking, decreased ROM, decreased strength, dizziness, and postural dysfunction.   ACTIVITY LIMITATIONS:  carrying, lifting, bending, standing, squatting, and locomotion level  PARTICIPATION LIMITATIONS: meal prep, cleaning, laundry, and community activity  PERSONAL FACTORS: Fitness and Past/current experiences are also affecting patient's functional outcome.   REHAB POTENTIAL: Good  CLINICAL DECISION MAKING: Evolving/moderate complexity  EVALUATION COMPLEXITY: High   GOALS: Goals reviewed with patient? Yes  SHORT TERM GOALS: Target date: 10/27/23 Pt. Independent with HEP to increase B LE muscle strength 1/2 muscle grade to improve mobility.    Baseline:  see above Goal status: INITIAL  2.  Pt. Will complete 45 minutes of exercise/ PT with on rest breaks to improve overall endurance with daily/ community tasks.  Baseline:  limited endurance Goal status: INITIAL  LONG TERM GOALS: Target date: 11/17/23  Pt. Will increase LEFS 15 points to improve pain-free mobility with daily tasks.   Baseline: TBD next tx.  Goal status: INITIAL  2.  Pt. Will complete 5xSTS in <20 sec. With no UE assist on armrest to improve independence with transfers.   Baseline: 3.5x with no UE assist.  19.22 seconds.   Goal status: INITIAL  3.  Pt. Will progress to independent gym [facility]) based ex. Program to maintain LE strength gains to improve daily mobility/ endurance.  Baseline: pt. Member at [facility] but not attending.  Goal status: INITIAL   PLAN:  PT FREQUENCY: 2x/week  PT DURATION: 6 weeks  PLANNED INTERVENTIONS: 97110-Therapeutic exercises, 97530- Therapeutic activity, [id number]- Neuromuscular re-education, 97535- Self Care, [id number]- Manual therapy, [phone]- Gait training, Patient/Family education, Balance training, Stair training, Cryotherapy, and Moist heat  PLAN FOR NEXT SESSION: Complete LEFS/ issue HEP handouts.    Cammie Mcgee, PT, DPT # [phone] 10/06/2023, 8:10 PM

## 2023-10-08 ENCOUNTER — Ambulatory Visit: Payer: PPO | Admitting: Physical Therapy

## 2023-10-08 ENCOUNTER — Encounter: Payer: Self-pay | Admitting: Physical Therapy

## 2023-10-08 DIAGNOSIS — R269 Unspecified abnormalities of gait and mobility: Secondary | ICD-10-CM

## 2023-10-08 DIAGNOSIS — M6281 Muscle weakness (generalized): Secondary | ICD-10-CM

## 2023-10-08 DIAGNOSIS — R5382 Chronic fatigue, unspecified: Secondary | ICD-10-CM

## 2023-10-08 DIAGNOSIS — R2689 Other abnormalities of gait and mobility: Secondary | ICD-10-CM

## 2023-10-12 NOTE — Therapy (Signed)
 OUTPATIENT PHYSICAL THERAPY LOWER EXTREMITY TREATMENT   Patient Name: Lynn Mann MRN: [id number] DOB:04-Sep-1950, 73 y.o., female Today's Date: 10/12/2023  END OF SESSION: PT End of Session - 10/12/23 2015       Visit Number 2     Number of Visits 12     Date for PT Re-Evaluation 11/17/23     PT Start Time 1031    PT Stop Time 1115    PT Time Calculation (min) 44 min      Past Medical History:  Diagnosis Date   Arthritis    C. difficile diarrhea    age 73s-40s    Chicken pox    Cholecystitis 11/2011   Did not require sgy - Dr. Sammuel Cooper - [facility]  (cholelithiasis)   H/O Clostridium difficile infection    IBS (irritable bowel syndrome)    MRSA exposure 2005   Spider bite   MVP (mitral valve prolapse)    Stable - Dr. Lady Gary   Rheumatic fever    Past Surgical History:  Procedure Laterality Date   CATARACT EXTRACTION  [id number]   MUSCLE BIOPSY     Patient Active Problem List   Diagnosis Date Noted   Pelvic congestive syndrome 01/14/2023   Estrogen deficiency 06/23/2022   Nocturia 03/12/2022   Sleep apnea 03/12/2022   Muscle weakness 03/11/2022   Joint ache 02/11/2022   Ankle pain 02/05/2022   Fever 12/26/2021   Abdominal pain 12/26/2021   Cough 09/07/2021   Right shoulder pain 07/15/2021   Hematoma 02/11/2021   Ankle swelling 11/26/2020   Low back pain 11/22/2020   Hip pain, right 11/22/2020   Light headedness 06/24/2020   Hearing loss 06/24/2020   Binocular vision disorder with diplopia 06/24/2020   Stress 05/17/2020   Chest pain 05/16/2020   Welcome to Medicare preventive visit 06/20/2019   Viral syndrome 01/30/2019   Itching 07/21/2017   Asthma 07/16/2016   Urinary incontinence 07/16/2016   SOB (shortness of breath) 04/01/2016   Bilateral shoulder pain 02/24/2016   Fatigue 01/28/2016   Neck fullness 01/28/2016   Routine general medical examination at a health care facility 07/25/2015   Health care maintenance 10/09/2014   Neck pain 11/26/2013    Hypercholesterolemia 11/26/2013   History of colonic polyps 05/11/2013   GERD (gastroesophageal reflux disease) 12/03/2012   Cholelithiasis 11/07/2012   IBS (irritable bowel syndrome) 11/07/2012   History of rheumatic fever 08/28/2012   MVP (mitral valve prolapse) 08/28/2012    PCP: Dale [place], MD  REFERRING PROVIDER: Dale [place], MD  REFERRING DIAG:    R53.83 (ICD-10-CM) - Other fatigue  M62.81 (ICD-10-CM) - Muscle weakness    THERAPY DIAG:  Muscle weakness (generalized)  Gait difficulty  Chronic fatigue  Other abnormalities of gait and mobility  Rationale for Evaluation and Treatment: Rehabilitation  ONSET DATE: chronic (>1 year)  SUBJECTIVE:   SUBJECTIVE STATEMENT: Got Covid 5 days before Christmas in 2023 and reports several complications (Gastritis/ Esophagitis/ 1 hiatal hernia and 1 umbilical hernia/ GERD).  Pt. Reports she is tired and out of shape.  Pt. Reports she has no endurance and requires seated rest breaks.  Pt. States she has pelvic congestion and feels weak in core.    PERTINENT HISTORY: Enjoys making vision boards.   Pt. Is busy with maintaining household chores.  Goes to meeting at [employer] center and [employer].  Pt. Belongs to the [facility].    PAIN:  Are you having pain? No  PRECAUTIONS: None  RED FLAGS: None  WEIGHT BEARING RESTRICTIONS: No  FALLS:  Has patient fallen in last 6 months? No  LIVING ENVIRONMENT: Lives with: lives alone Lives in: House/apartment Stairs: Yes: External: 1 steps; none Has following equipment at home: None  OCCUPATION: Retired  PLOF: Independent  PATIENT GOALS: Increase LE and core muscle i and improve overall energy.    NEXT MD VISIT: PRN (blood work in April).    OBJECTIVE:  Note: Objective measures were completed at Evaluation unless otherwise noted.  DIAGNOSTIC FINDINGS: N/A  PATIENT SURVEYS:  LEFS TBD next tx. session  COGNITION: Overall cognitive status: Within functional limits for tasks  assessed    SENSATION: WFL  EDEMA:  NT  MUSCLE LENGTH: Hamstrings: Right 80 deg; Left 80 deg Thomas test: NT  POSTURE: rounded shoulders and forward head  PALPATION: NT  LOWER EXTREMITY ROM:  Active ROM Right eval Left eval  Hip flexion [facility]  Hip extension    Hip abduction    Hip adduction    Hip internal rotation    Hip external rotation    Knee flexion [facility]  Knee extension [facility]  Ankle dorsiflexion [facility] WFL  Ankle plantarflexion [facility] WFL  Ankle inversion    Ankle eversion     (Blank rows = not tested)  LOWER EXTREMITY MMT:  MMT Right eval Left eval  Hip flexion 4- 4-  Hip extension    Hip abduction 4 4  Hip adduction    Hip internal rotation    Hip external rotation    Knee flexion 4+ 4+  Knee extension 5 5  Ankle dorsiflexion 4 4  Ankle plantarflexion 4 4  Ankle inversion    Ankle eversion     (Blank rows = not tested)  L anterior thigh discomfort with bridging.    LOWER EXTREMITY SPECIAL TESTS:  NT  FUNCTIONAL TESTS:  5 times sit to stand:    Completed 3.5x with no UE assist.  19.22 seconds.    GAIT: Distance walked: in clinic Assistive device utilized: None Level of assistance: Complete Independence Comments: Forward posture with recip. Gait pattern.  Limited arm swing noted.  Consistent step length/ BOS noted.   Prolonged standing will increase nausea (>10 minutes)- no known cause.                                                                                                                                TREATMENT DATE: 10/08/23   Subjective:  Pt. Reports no new complaints.  Pt. Ready for LE HEP.    There.ex.:  Nustep (seat #10 at L4)- B UE/LE for 10 min.    Seated/ standing alt. UE/LE touches (posture correction).  Seated marching/ LAQ 10x. (No wt.).  Standing hip ex. (See HEP).    See HEP (attempted use of ankle wts. But pinched pts. Skin around ankle due to no socks on).    There.act.:  Resisted gait 2BTB 5x all  4-planes in //-bars (  no UE assist)- pt. Challenged with recip. Step pattern.  SBA for safety/ cuing.   SLS <10 sec. On L/R  Tandem stance: >20 sec.   PATIENT EDUCATION:  Education details: Air traffic controller Person educated: Patient Education method: Medical illustrator Education comprehension: verbalized understanding and returned demonstration  HOME EXERCISE PROGRAM: Access Code: [facility] URL: [email or web address] Date: 10/08/2023 Prepared by: Dorene Grebe  Exercises - Mini Squat with Counter Support  - 1 x daily - 7 x weekly - 2 sets - 10 reps - Standing Hip Extension with Unilateral Counter Support  - 1 x daily - 7 x weekly - 2 sets - 10 reps - Standing Hip Abduction with Unilateral Counter Support  - 1 x daily - 7 x weekly - 2 sets - 10 reps - Standing March with Unilateral Counter Support  - 1 x daily - 7 x weekly - 2 sets - 10 reps - Standing Knee Flexion with Unilateral Counter Support  - 1 x daily - 7 x weekly - 2 sets - 10 reps  ASSESSMENT:  CLINICAL IMPRESSION: PT tx. Session focused on LE strengthening/ balance ex.  Pt. Issued LE ex. Program and instructed to complete daily as tolerated.   Pt. Presents with limited overall endurance with daily tasks/ standing and motivated to develop a strengthening ex. Program.  Pt. will benefit from skilled PT services to increase B LE muscle strength to improve overall endurance with daily household/ community tasks.     OBJECTIVE IMPAIRMENTS: decreased activity tolerance, decreased balance, decreased endurance, decreased mobility, difficulty walking, decreased ROM, decreased strength, dizziness, and postural dysfunction.   ACTIVITY LIMITATIONS: carrying, lifting, bending, standing, squatting, and locomotion level  PARTICIPATION LIMITATIONS: meal prep, cleaning, laundry, and community activity  PERSONAL FACTORS: Fitness and Past/current experiences are also affecting patient's functional outcome.    REHAB POTENTIAL: Good  CLINICAL DECISION MAKING: Evolving/moderate complexity  EVALUATION COMPLEXITY: High   GOALS: Goals reviewed with patient? Yes  SHORT TERM GOALS: Target date: 10/27/23 Pt. Independent with HEP to increase B LE muscle strength 1/2 muscle grade to improve mobility.    Baseline:  see above Goal status: INITIAL  2.  Pt. Will complete 45 minutes of exercise/ PT with on rest breaks to improve overall endurance with daily/ community tasks.  Baseline:  limited endurance Goal status: INITIAL  LONG TERM GOALS: Target date: 11/17/23  Pt. Will increase LEFS 15 points to improve pain-free mobility with daily tasks.   Baseline: TBD next tx.  Goal status: INITIAL  2.  Pt. Will complete 5xSTS in <20 sec. With no UE assist on armrest to improve independence with transfers.   Baseline: 3.5x with no UE assist.  19.22 seconds.   Goal status: INITIAL  3.  Pt. Will progress to independent gym [facility]) based ex. Program to maintain LE strength gains to improve daily mobility/ endurance.  Baseline: pt. Member at [facility] but not attending.  Goal status: INITIAL   PLAN:  PT FREQUENCY: 2x/week  PT DURATION: 6 weeks  PLANNED INTERVENTIONS: 97110-Therapeutic exercises, 97530- Therapeutic activity, [id number]- Neuromuscular re-education, 97535- Self Care, 25956- Manual therapy, [phone]- Gait training, Patient/Family education, Balance training, Stair training, Cryotherapy, and Moist heat  PLAN FOR NEXT SESSION: Complete LEFS  Cammie Mcgee, PT, DPT # [id number] 10/12/2023, 8:14 PM

## 2023-10-13 ENCOUNTER — Encounter: Payer: Self-pay | Admitting: Physical Therapy

## 2023-10-13 ENCOUNTER — Ambulatory Visit: Payer: PPO | Admitting: Physical Therapy

## 2023-10-13 DIAGNOSIS — R2689 Other abnormalities of gait and mobility: Secondary | ICD-10-CM

## 2023-10-13 DIAGNOSIS — M6281 Muscle weakness (generalized): Secondary | ICD-10-CM | POA: Diagnosis not present

## 2023-10-13 DIAGNOSIS — R5382 Chronic fatigue, unspecified: Secondary | ICD-10-CM

## 2023-10-13 DIAGNOSIS — R269 Unspecified abnormalities of gait and mobility: Secondary | ICD-10-CM

## 2023-10-13 NOTE — Therapy (Signed)
 OUTPATIENT PHYSICAL THERAPY LOWER EXTREMITY TREATMENT   Patient Name: Lynn Mann MRN: [id number] DOB:05-12-1951, 73 y.o., female Today's Date: 10/13/2023  END OF SESSION:   PT End of Session - 10/13/23 0948     Visit Number 3    Number of Visits 12    Date for PT Re-Evaluation 11/17/23    PT Start Time 0948    PT Stop Time 1032    PT Time Calculation (min) 44 min             Past Medical History:  Diagnosis Date   Arthritis    C. difficile diarrhea    age 58s-40s    Chicken pox    Cholecystitis 11/2011   Did not require sgy - Dr. Sammuel Cooper - [facility]  (cholelithiasis)   H/O Clostridium difficile infection    IBS (irritable bowel syndrome)    MRSA exposure 2005   Spider bite   MVP (mitral valve prolapse)    Stable - Dr. Lady Gary   Rheumatic fever    Past Surgical History:  Procedure Laterality Date   CATARACT EXTRACTION  [id number]   MUSCLE BIOPSY     Patient Active Problem List   Diagnosis Date Noted   Pelvic congestive syndrome 01/14/2023   Estrogen deficiency 06/23/2022   Nocturia 03/12/2022   Sleep apnea 03/12/2022   Muscle weakness 03/11/2022   Joint ache 02/11/2022   Ankle pain 02/05/2022   Fever 12/26/2021   Abdominal pain 12/26/2021   Cough 09/07/2021   Right shoulder pain 07/15/2021   Hematoma 02/11/2021   Ankle swelling 11/26/2020   Low back pain 11/22/2020   Hip pain, right 11/22/2020   Light headedness 06/24/2020   Hearing loss 06/24/2020   Binocular vision disorder with diplopia 06/24/2020   Stress 05/17/2020   Chest pain 05/16/2020   Welcome to Medicare preventive visit 06/20/2019   Viral syndrome 01/30/2019   Itching 07/21/2017   Asthma 07/16/2016   Urinary incontinence 07/16/2016   SOB (shortness of breath) 04/01/2016   Bilateral shoulder pain 02/24/2016   Fatigue 01/28/2016   Neck fullness 01/28/2016   Routine general medical examination at a health care facility 07/25/2015   Health care maintenance 10/09/2014   Neck pain  11/26/2013   Hypercholesterolemia 11/26/2013   History of colonic polyps 05/11/2013   GERD (gastroesophageal reflux disease) 12/03/2012   Cholelithiasis 11/07/2012   IBS (irritable bowel syndrome) 11/07/2012   History of rheumatic fever 08/28/2012   MVP (mitral valve prolapse) 08/28/2012    PCP: Dale [place], MD  REFERRING PROVIDER: Dale [place], MD  REFERRING DIAG:    R53.83 (ICD-10-CM) - Other fatigue  M62.81 (ICD-10-CM) - Muscle weakness    THERAPY DIAG:  Muscle weakness (generalized)  Gait difficulty  Chronic fatigue  Other abnormalities of gait and mobility  Rationale for Evaluation and Treatment: Rehabilitation  ONSET DATE: chronic (>1 year)  SUBJECTIVE:   SUBJECTIVE STATEMENT: Got Covid 5 days before Christmas in 2023 and reports several complications (Gastritis/ Esophagitis/ 1 hiatal hernia and 1 umbilical hernia/ GERD).  Pt. Reports she is tired and out of shape.  Pt. Reports she has no endurance and requires seated rest breaks.  Pt. States she has pelvic congestion and feels weak in core.    PERTINENT HISTORY: Enjoys making vision boards.   Pt. Is busy with maintaining household chores.  Goes to meeting at [employer] center and [employer].  Pt. Belongs to the [facility].    PAIN:  Are you having pain? No  PRECAUTIONS: None  RED FLAGS: None   WEIGHT BEARING RESTRICTIONS: No  FALLS:  Has patient fallen in last 6 months? No  LIVING ENVIRONMENT: Lives with: lives alone Lives in: House/apartment Stairs: Yes: External: 1 steps; none Has following equipment at home: None  OCCUPATION: Retired  PLOF: Independent  PATIENT GOALS: Increase LE and core muscle i and improve overall energy.    NEXT MD VISIT: PRN (blood work in April).    OBJECTIVE:  Note: Objective measures were completed at Evaluation unless otherwise noted.  DIAGNOSTIC FINDINGS: N/A  PATIENT SURVEYS:  LEFS TBD next tx. session  COGNITION: Overall cognitive status: Within functional  limits for tasks assessed    SENSATION: WFL  EDEMA:  NT  MUSCLE LENGTH: Hamstrings: Right 80 deg; Left 80 deg Thomas test: NT  POSTURE: rounded shoulders and forward head  PALPATION: NT  LOWER EXTREMITY ROM:  Active ROM Right eval Left eval  Hip flexion [facility]  Hip extension    Hip abduction    Hip adduction    Hip internal rotation    Hip external rotation    Knee flexion [facility]  Knee extension [facility]  Ankle dorsiflexion [facility] WFL  Ankle plantarflexion [facility] WFL  Ankle inversion    Ankle eversion     (Blank rows = not tested)  LOWER EXTREMITY MMT:  MMT Right eval Left eval  Hip flexion 4- 4-  Hip extension    Hip abduction 4 4  Hip adduction    Hip internal rotation    Hip external rotation    Knee flexion 4+ 4+  Knee extension 5 5  Ankle dorsiflexion 4 4  Ankle plantarflexion 4 4  Ankle inversion    Ankle eversion     (Blank rows = not tested)  L anterior thigh discomfort with bridging.    LOWER EXTREMITY SPECIAL TESTS:  NT  FUNCTIONAL TESTS:  5 times sit to stand:    Completed 3.5x with no UE assist.  19.22 seconds.    GAIT: Distance walked: in clinic Assistive device utilized: None Level of assistance: Complete Independence Comments: Forward posture with recip. Gait pattern.  Limited arm swing noted.  Consistent step length/ BOS noted.   Prolonged standing will increase nausea (>10 minutes)- no known cause.                                                                                                                                TREATMENT DATE: 10/13/2023  Subjective:  Pt. States she was tired the other day when walking around [place]. [place] Day celebration.  Pt. Reports her walk changed due to fatigue/ low back and hip discomfort.    LEFS: 34 out of 80  There.ex.:  Walking in hallway (4 laps) with consistent recip. Step pattern/ upright posture/ arm swing.    TRX squats: 10x2.    Seated GTB marching/ hip abduction 20x each.   Seated heel and toe raises 20x.  Nustep (seat #10 at L3)- B UE/LE for 10 min.  MH to low back ("feels good").    Reviewed HEP/ discussed aquatic ex. At [facility] (pt. Is planning to shop for bathing suit).    There.act.:  Resisted gait 2BTB 4x all 4-planes in //-bars (no UE assist)- pt. Challenged with recip. Step pattern.  SBA for safety/ cuing.    NOT TODAY SLS <10 sec. On L/R Tandem stance: >20 sec.   PATIENT EDUCATION:  Education details: Air traffic controller Person educated: Patient Education method: Medical illustrator Education comprehension: verbalized understanding and returned demonstration  HOME EXERCISE PROGRAM: Access Code: [facility] URL: [email or web address] Date: 10/08/2023 Prepared by: Dorene Grebe  Exercises - Mini Squat with Counter Support  - 1 x daily - 7 x weekly - 2 sets - 10 reps - Standing Hip Extension with Unilateral Counter Support  - 1 x daily - 7 x weekly - 2 sets - 10 reps - Standing Hip Abduction with Unilateral Counter Support  - 1 x daily - 7 x weekly - 2 sets - 10 reps - Standing March with Unilateral Counter Support  - 1 x daily - 7 x weekly - 2 sets - 10 reps - Standing Knee Flexion with Unilateral Counter Support  - 1 x daily - 7 x weekly - 2 sets - 10 reps  ASSESSMENT:  CLINICAL IMPRESSION: PT tx. Session focused on LE strengthening/ balance ex.  Pt. understands LE ex. Program and instructed to complete daily as tolerated and look for ankle wts. At home.   Pt. Presents with limited overall endurance with daily tasks/ standing and motivated to develop a strengthening ex. Program.  Pt. will benefit from skilled PT services to increase B LE muscle strength to improve overall endurance with daily household/ community tasks.     OBJECTIVE IMPAIRMENTS: decreased activity tolerance, decreased balance, decreased endurance, decreased mobility, difficulty walking, decreased ROM, decreased strength, dizziness, and postural  dysfunction.   ACTIVITY LIMITATIONS: carrying, lifting, bending, standing, squatting, and locomotion level  PARTICIPATION LIMITATIONS: meal prep, cleaning, laundry, and community activity  PERSONAL FACTORS: Fitness and Past/current experiences are also affecting patient's functional outcome.   REHAB POTENTIAL: Good  CLINICAL DECISION MAKING: Evolving/moderate complexity  EVALUATION COMPLEXITY: High   GOALS: Goals reviewed with patient? Yes  SHORT TERM GOALS: Target date: 10/27/23 Pt. Independent with HEP to increase B LE muscle strength 1/2 muscle grade to improve mobility.    Baseline:  see above Goal status: INITIAL  2.  Pt. Will complete 45 minutes of exercise/ PT with on rest breaks to improve overall endurance with daily/ community tasks.  Baseline:  limited endurance Goal status: INITIAL  LONG TERM GOALS: Target date: 11/17/23  Pt. Will increase LEFS 15 points to improve pain-free mobility with daily tasks.   Baseline: 3/17: 34 out of 80.   Goal status: INITIAL  2.  Pt. Will complete 5xSTS in <20 sec. With no UE assist on armrest to improve independence with transfers.   Baseline: 3.5x with no UE assist.  19.22 seconds.   Goal status: INITIAL  3.  Pt. Will progress to independent gym [facility]) based ex. Program to maintain LE strength gains to improve daily mobility/ endurance.  Baseline: pt. Member at [facility] but not attending.  Goal status: INITIAL   PLAN:  PT FREQUENCY: 2x/week  PT DURATION: 6 weeks  PLANNED INTERVENTIONS: 97110-Therapeutic exercises, 97530- Therapeutic activity, [id number]- Neuromuscular re-education, 97535- Self Care, 78295- Manual therapy, [id number]- Gait training, Patient/Family education, Balance training, Stair training,  Cryotherapy, and Moist heat  PLAN FOR NEXT SESSION: Progress HEP/ walking endurance  Cammie Mcgee, PT, DPT # [id number] 10/13/2023, 12:33 PM

## 2023-10-15 ENCOUNTER — Ambulatory Visit: Payer: PPO | Admitting: Physical Therapy

## 2023-10-15 ENCOUNTER — Encounter: Payer: Self-pay | Admitting: Physical Therapy

## 2023-10-15 DIAGNOSIS — R2689 Other abnormalities of gait and mobility: Secondary | ICD-10-CM

## 2023-10-15 DIAGNOSIS — R269 Unspecified abnormalities of gait and mobility: Secondary | ICD-10-CM

## 2023-10-15 DIAGNOSIS — M6281 Muscle weakness (generalized): Secondary | ICD-10-CM

## 2023-10-15 DIAGNOSIS — R278 Other lack of coordination: Secondary | ICD-10-CM

## 2023-10-15 DIAGNOSIS — R5382 Chronic fatigue, unspecified: Secondary | ICD-10-CM

## 2023-10-15 NOTE — Therapy (Signed)
 OUTPATIENT PHYSICAL THERAPY LOWER EXTREMITY TREATMENT   Patient Name: Lynn Mann MRN: [id number] DOB:[birth year], 73 y.o., female Today's Date: 10/15/2023  END OF SESSION:   PT End of Session - 10/15/23 0953     Visit Number 4    Number of Visits 12    Date for PT Re-Evaluation 11/17/23    PT Start Time 0953    PT Stop Time 1035    PT Time Calculation (min) 42 min             Past Medical History:  Diagnosis Date   Arthritis    C. difficile diarrhea    age 66s-40s    Chicken pox    Cholecystitis 11/2011   Did not require sgy - Dr. Sammuel Cooper - [facility]  (cholelithiasis)   H/O Clostridium difficile infection    IBS (irritable bowel syndrome)    MRSA exposure 2005   Spider bite   MVP (mitral valve prolapse)    Stable - Dr. Lady Gary   Rheumatic fever    Past Surgical History:  Procedure Laterality Date   CATARACT EXTRACTION  [id number]   MUSCLE BIOPSY     Patient Active Problem List   Diagnosis Date Noted   Pelvic congestive syndrome 01/14/2023   Estrogen deficiency 06/23/2022   Nocturia 03/12/2022   Sleep apnea 03/12/2022   Muscle weakness 03/11/2022   Joint ache 02/11/2022   Ankle pain 02/05/2022   Fever 12/26/2021   Abdominal pain 12/26/2021   Cough 09/07/2021   Right shoulder pain 07/15/2021   Hematoma 02/11/2021   Ankle swelling 11/26/2020   Low back pain 11/22/2020   Hip pain, right 11/22/2020   Light headedness 06/24/2020   Hearing loss 06/24/2020   Binocular vision disorder with diplopia 06/24/2020   Stress 05/17/2020   Chest pain 05/16/2020   Welcome to Medicare preventive visit 06/20/2019   Viral syndrome 01/30/2019   Itching 07/21/2017   Asthma 07/16/2016   Urinary incontinence 07/16/2016   SOB (shortness of breath) 04/01/2016   Bilateral shoulder pain 02/24/2016   Fatigue 01/28/2016   Neck fullness 01/28/2016   Routine general medical examination at a health care facility 07/25/2015   Health care maintenance 10/09/2014   Neck pain  11/26/2013   Hypercholesterolemia 11/26/2013   History of colonic polyps 05/11/2013   GERD (gastroesophageal reflux disease) 12/03/2012   Cholelithiasis 11/07/2012   IBS (irritable bowel syndrome) 11/07/2012   History of rheumatic fever 08/28/2012   MVP (mitral valve prolapse) 08/28/2012    PCP: Dale [place], MD  REFERRING PROVIDER: Dale [place], MD  REFERRING DIAG:    R53.83 (ICD-10-CM) - Other fatigue  M62.81 (ICD-10-CM) - Muscle weakness    THERAPY DIAG:  Muscle weakness (generalized)  Gait difficulty  Chronic fatigue  Other abnormalities of gait and mobility  Other lack of coordination  Rationale for Evaluation and Treatment: Rehabilitation  ONSET DATE: chronic (>1 year)  SUBJECTIVE:   SUBJECTIVE STATEMENT: Got Covid 5 days before Christmas in 2023 and reports several complications (Gastritis/ Esophagitis/ 1 hiatal hernia and 1 umbilical hernia/ GERD).  Pt. Reports she is tired and out of shape.  Pt. Reports she has no endurance and requires seated rest breaks.  Pt. States she has pelvic congestion and feels weak in core.    PERTINENT HISTORY: Enjoys making vision boards.   Pt. Is busy with maintaining household chores.  Goes to meeting at [employer] center and [employer].  Pt. Belongs to the [facility].    PAIN:  Are you having pain?  No  PRECAUTIONS: None  RED FLAGS: None   WEIGHT BEARING RESTRICTIONS: No  FALLS:  Has patient fallen in last 6 months? No  LIVING ENVIRONMENT: Lives with: lives alone Lives in: House/apartment Stairs: Yes: External: 1 steps; none Has following equipment at home: None  OCCUPATION: Retired  PLOF: Independent  PATIENT GOALS: Increase LE and core muscle i and improve overall energy.    NEXT MD VISIT: PRN (blood work in April).    OBJECTIVE:  Note: Objective measures were completed at Evaluation unless otherwise noted.  DIAGNOSTIC FINDINGS: N/A  PATIENT SURVEYS:  LEFS TBD next tx. session  COGNITION: Overall  cognitive status: Within functional limits for tasks assessed    SENSATION: WFL  EDEMA:  NT  MUSCLE LENGTH: Hamstrings: Right 80 deg; Left 80 deg Thomas test: NT  POSTURE: rounded shoulders and forward head  PALPATION: NT  LOWER EXTREMITY ROM:  Active ROM Right eval Left eval  Hip flexion [facility]  Hip extension    Hip abduction    Hip adduction    Hip internal rotation    Hip external rotation    Knee flexion [facility]  Knee extension [facility]  Ankle dorsiflexion [facility] WFL  Ankle plantarflexion [facility] WFL  Ankle inversion    Ankle eversion     (Blank rows = not tested)  LOWER EXTREMITY MMT:  MMT Right eval Left eval  Hip flexion 4- 4-  Hip extension    Hip abduction 4 4  Hip adduction    Hip internal rotation    Hip external rotation    Knee flexion 4+ 4+  Knee extension 5 5  Ankle dorsiflexion 4 4  Ankle plantarflexion 4 4  Ankle inversion    Ankle eversion     (Blank rows = not tested)  L anterior thigh discomfort with bridging.    LOWER EXTREMITY SPECIAL TESTS:  NT  FUNCTIONAL TESTS:  5 times sit to stand:    Completed 3.5x with no UE assist.  19.22 seconds.    GAIT: Distance walked: in clinic Assistive device utilized: None Level of assistance: Complete Independence Comments: Forward posture with recip. Gait pattern.  Limited arm swing noted.  Consistent step length/ BOS noted.   Prolonged standing will increase nausea (>10 minutes)- no known cause.               10/13/23: LEFS: 34 out of 80                                                                                                                   TREATMENT DATE: 10/15/2023  Subjective:  Pt. C/o B quad muscle soreness and states she had some difficulty getting out of bathtub this morning.  Pt. Has not attended [employer] at this time.    There.ex.:  Nustep (seat #10 at L4)- B UE/LE for 10 min.  MH to low back ("feels good").    Supine LE generalized stretches with focus on hamstring/ quads.   Demonstrated various quad stretches in  standing/prone position (with band)- 3x20-30 second holds.  Cuing to avoid pain provoking stretches, esp. During prone quad stretch.    Supine L/R SLR/ marching 10x each.  Supine bolster bridging 10x with cuing to maintain core muscle activation.   Reviewed HEP.  There.act.:  Resisted gait 2BTB 4x all 4-planes in //-bars (no UE assist)- pt. Challenged with recip. Step pattern.  SBA for safety/ cuing.   Walking in hallway (4 laps) with consistent recip. Step pattern/ upright posture/ arm swing.     NOT TODAY SLS <10 sec. On L/R Tandem stance: >20 sec.   TRX squats: 10x2.    Seated GTB marching/ hip abduction 20x each.  Seated heel and toe raises 20x.    PATIENT EDUCATION:  Education details: Air traffic controller Person educated: Patient Education method: Medical illustrator Education comprehension: verbalized understanding and returned demonstration  HOME EXERCISE PROGRAM: Access Code: [facility] URL: [email or web address] Date: 10/08/2023 Prepared by: Dorene Grebe  Exercises - Mini Squat with Counter Support  - 1 x daily - 7 x weekly - 2 sets - 10 reps - Standing Hip Extension with Unilateral Counter Support  - 1 x daily - 7 x weekly - 2 sets - 10 reps - Standing Hip Abduction with Unilateral Counter Support  - 1 x daily - 7 x weekly - 2 sets - 10 reps - Standing March with Unilateral Counter Support  - 1 x daily - 7 x weekly - 2 sets - 10 reps - Standing Knee Flexion with Unilateral Counter Support  - 1 x daily - 7 x weekly - 2 sets - 10 reps  ASSESSMENT:  CLINICAL IMPRESSION: PT tx. Session focused on LE strengthening/ balance ex.  Pt. understands LE ex. Program and instructed to complete daily as tolerated and look for ankle wts. At home.  Pt. Has good hamstring/ quad muscle length and instructed to prone quad stretches if tightness/ discomfort noted.  Pt. Presents with limited overall endurance with daily  tasks/ standing and motivated to develop a strengthening ex. Program.  Pt. will benefit from skilled PT services to increase B LE muscle strength to improve overall endurance with daily household/ community tasks.     OBJECTIVE IMPAIRMENTS: decreased activity tolerance, decreased balance, decreased endurance, decreased mobility, difficulty walking, decreased ROM, decreased strength, dizziness, and postural dysfunction.   ACTIVITY LIMITATIONS: carrying, lifting, bending, standing, squatting, and locomotion level  PARTICIPATION LIMITATIONS: meal prep, cleaning, laundry, and community activity  PERSONAL FACTORS: Fitness and Past/current experiences are also affecting patient's functional outcome.   REHAB POTENTIAL: Good  CLINICAL DECISION MAKING: Evolving/moderate complexity  EVALUATION COMPLEXITY: High   GOALS: Goals reviewed with patient? Yes  SHORT TERM GOALS: Target date: 10/27/23 Pt. Independent with HEP to increase B LE muscle strength 1/2 muscle grade to improve mobility.    Baseline:  see above Goal status: INITIAL  2.  Pt. Will complete 45 minutes of exercise/ PT with on rest breaks to improve overall endurance with daily/ community tasks.  Baseline:  limited endurance Goal status: INITIAL  LONG TERM GOALS: Target date: 11/17/23  Pt. Will increase LEFS 15 points to improve pain-free mobility with daily tasks.   Baseline: 3/17: 34 out of 80.   Goal status: INITIAL  2.  Pt. Will complete 5xSTS in <20 sec. With no UE assist on armrest to improve independence with transfers.   Baseline: 3.5x with no UE assist.  19.22 seconds.   Goal status: INITIAL  3.  Pt. Will progress to independent gym [facility])  based ex. Program to maintain LE strength gains to improve daily mobility/ endurance.  Baseline: pt. Member at [facility] but not attending.  Goal status: INITIAL   PLAN:  PT FREQUENCY: 2x/week  PT DURATION: 6 weeks  PLANNED INTERVENTIONS: 97110-Therapeutic exercises, 97530-  Therapeutic activity, [id number]- Neuromuscular re-education, 97535- Self Care, [id number]- Manual therapy, [phone]- Gait training, Patient/Family education, Balance training, Stair training, Cryotherapy, and Moist heat  PLAN FOR NEXT SESSION: Progress HEP/ walking endurance  Cammie Mcgee, PT, DPT # [phone] 10/15/2023, 3:34 PM

## 2023-10-20 ENCOUNTER — Encounter: Payer: Self-pay | Admitting: Physical Therapy

## 2023-10-20 ENCOUNTER — Ambulatory Visit: Payer: PPO | Admitting: Physical Therapy

## 2023-10-20 DIAGNOSIS — R2689 Other abnormalities of gait and mobility: Secondary | ICD-10-CM

## 2023-10-20 DIAGNOSIS — R278 Other lack of coordination: Secondary | ICD-10-CM

## 2023-10-20 DIAGNOSIS — M6281 Muscle weakness (generalized): Secondary | ICD-10-CM

## 2023-10-20 DIAGNOSIS — R269 Unspecified abnormalities of gait and mobility: Secondary | ICD-10-CM

## 2023-10-20 DIAGNOSIS — R5382 Chronic fatigue, unspecified: Secondary | ICD-10-CM

## 2023-10-20 NOTE — Therapy (Signed)
 OUTPATIENT PHYSICAL THERAPY LOWER EXTREMITY TREATMENT   Patient Name: Lynn Mann MRN: [id number] DOB:November 08, 1950, 73 y.o., female Today's Date: 10/20/2023  END OF SESSION:   PT End of Session - 10/20/23 1040     Visit Number 5    Number of Visits 12    Date for PT Re-Evaluation 11/17/23    PT Start Time 1034    PT Stop Time 1116    PT Time Calculation (min) 42 min             Past Medical History:  Diagnosis Date   Arthritis    C. difficile diarrhea    age 76s-40s    Chicken pox    Cholecystitis 11/2011   Did not require sgy - Dr. Sammuel Cooper - [facility]  (cholelithiasis)   H/O Clostridium difficile infection    IBS (irritable bowel syndrome)    MRSA exposure 2005   Spider bite   MVP (mitral valve prolapse)    Stable - Dr. Lady Gary   Rheumatic fever    Past Surgical History:  Procedure Laterality Date   CATARACT EXTRACTION  [id number]   MUSCLE BIOPSY     Patient Active Problem List   Diagnosis Date Noted   Pelvic congestive syndrome 01/14/2023   Estrogen deficiency 06/23/2022   Nocturia 03/12/2022   Sleep apnea 03/12/2022   Muscle weakness 03/11/2022   Joint ache 02/11/2022   Ankle pain 02/05/2022   Fever 12/26/2021   Abdominal pain 12/26/2021   Cough 09/07/2021   Right shoulder pain 07/15/2021   Hematoma 02/11/2021   Ankle swelling 11/26/2020   Low back pain 11/22/2020   Hip pain, right 11/22/2020   Light headedness 06/24/2020   Hearing loss 06/24/2020   Binocular vision disorder with diplopia 06/24/2020   Stress 05/17/2020   Chest pain 05/16/2020   Welcome to Medicare preventive visit 06/20/2019   Viral syndrome 01/30/2019   Itching 07/21/2017   Asthma 07/16/2016   Urinary incontinence 07/16/2016   SOB (shortness of breath) 04/01/2016   Bilateral shoulder pain 02/24/2016   Fatigue 01/28/2016   Neck fullness 01/28/2016   Routine general medical examination at a health care facility 07/25/2015   Health care maintenance 10/09/2014   Neck pain  11/26/2013   Hypercholesterolemia 11/26/2013   History of colonic polyps 05/11/2013   GERD (gastroesophageal reflux disease) 12/03/2012   Cholelithiasis 11/07/2012   IBS (irritable bowel syndrome) 11/07/2012   History of rheumatic fever 08/28/2012   MVP (mitral valve prolapse) 08/28/2012    PCP: Dale [place], MD  REFERRING PROVIDER: Dale [place], MD  REFERRING DIAG:    R53.83 (ICD-10-CM) - Other fatigue  M62.81 (ICD-10-CM) - Muscle weakness    THERAPY DIAG:  Muscle weakness (generalized)  Gait difficulty  Chronic fatigue  Other abnormalities of gait and mobility  Other lack of coordination  Rationale for Evaluation and Treatment: Rehabilitation  ONSET DATE: chronic (>1 year)  SUBJECTIVE:   SUBJECTIVE STATEMENT: Got Covid 5 days before Christmas in 2023 and reports several complications (Gastritis/ Esophagitis/ 1 hiatal hernia and 1 umbilical hernia/ GERD).  Pt. Reports she is tired and out of shape.  Pt. Reports she has no endurance and requires seated rest breaks.  Pt. States she has pelvic congestion and feels weak in core.    PERTINENT HISTORY: Enjoys making vision boards.   Pt. Is busy with maintaining household chores.  Goes to meeting at [employer] center and [employer].  Pt. Belongs to the [facility].    PAIN:  Are you having pain?  No  PRECAUTIONS: None  RED FLAGS: None   WEIGHT BEARING RESTRICTIONS: No  FALLS:  Has patient fallen in last 6 months? No  LIVING ENVIRONMENT: Lives with: lives alone Lives in: House/apartment Stairs: Yes: External: 1 steps; none Has following equipment at home: None  OCCUPATION: Retired  PLOF: Independent  PATIENT GOALS: Increase LE and core muscle i and improve overall energy.    NEXT MD VISIT: PRN (blood work in April).    OBJECTIVE:  Note: Objective measures were completed at Evaluation unless otherwise noted.  DIAGNOSTIC FINDINGS: N/A  PATIENT SURVEYS:  LEFS TBD next tx. session  COGNITION: Overall  cognitive status: Within functional limits for tasks assessed    SENSATION: WFL  EDEMA:  NT  MUSCLE LENGTH: Hamstrings: Right 80 deg; Left 80 deg Thomas test: NT  POSTURE: rounded shoulders and forward head  PALPATION: NT  LOWER EXTREMITY ROM:  Active ROM Right eval Left eval  Hip flexion [facility]  Hip extension    Hip abduction    Hip adduction    Hip internal rotation    Hip external rotation    Knee flexion [facility]  Knee extension [facility]  Ankle dorsiflexion [facility] WFL  Ankle plantarflexion [facility] WFL  Ankle inversion    Ankle eversion     (Blank rows = not tested)  LOWER EXTREMITY MMT:  MMT Right eval Left eval  Hip flexion 4- 4-  Hip extension    Hip abduction 4 4  Hip adduction    Hip internal rotation    Hip external rotation    Knee flexion 4+ 4+  Knee extension 5 5  Ankle dorsiflexion 4 4  Ankle plantarflexion 4 4  Ankle inversion    Ankle eversion     (Blank rows = not tested)  L anterior thigh discomfort with bridging.    LOWER EXTREMITY SPECIAL TESTS:  NT  FUNCTIONAL TESTS:  5 times sit to stand:    Completed 3.5x with no UE assist.  19.22 seconds.    GAIT: Distance walked: in clinic Assistive device utilized: None Level of assistance: Complete Independence Comments: Forward posture with recip. Gait pattern.  Limited arm swing noted.  Consistent step length/ BOS noted.   Prolonged standing will increase nausea (>10 minutes)- no known cause.               10/13/23: LEFS: 34 out of 80                                                                                                                   TREATMENT DATE: 10/20/2023  Subjective:  Pt. Reports improvement in B quad muscle soreness since last PT visit and states she has increase discomfort in R hip/ low back (4/10).  Pt. Put a heel lift in this morning at home and took 2 Aleve.   Pt. Has not attended [employer] at this time.    There.ex.:  Nustep (seat #10-11 at L3)- B UE/LE for 10 min.   MH to  low back ("feels good").  Discussed R LE pain and using B UE to offset R LE wt. Bearing on Nustep.    Standing hip ex.: marching in //-bars with added touches/ hamstring curls/ heel raises (feet slightly ER) 20x.    Seated marching/ alt. UE and LE/ LAQ (crepitus in B knee with LAQ).  Sit to stands with min. UE assist (gray chair).  Pt. Unable to stand without UE assist today.    Reassessment of hip height at ASIS and PSIS.  Good symmetry.    There.act.:  Walking in hallway (5 laps) with consistent recip. Step pattern/ upright posture/ arm swing.    Resisted gait 2BTB 4x all 4-planes in //-bars (no UE assist)- pt. Challenged with recip. Step pattern.  SBA for safety/ cuing.  Slight increase in R hip discomfort.    Reassessment SLS <10 sec. On L/R.  Increase R hip pain to 6/10.     NOT TODAY Tandem stance: >20 sec.   TRX squats: 10x2.    Seated GTB marching/ hip abduction 20x each.  Seated heel and toe raises 20x.    Supine LE generalized stretches with focus on hamstring/ quads.    Supine L/R SLR/ marching 10x each.  Supine bolster bridging 10x with cuing to maintain core muscle activation.    PATIENT EDUCATION:  Education details: Air traffic controller Person educated: Patient Education method: Medical illustrator Education comprehension: verbalized understanding and returned demonstration  HOME EXERCISE PROGRAM: Access Code: [facility] URL: [email or web address] Date: 10/08/2023 Prepared by: Dorene Grebe  Exercises - Mini Squat with Counter Support  - 1 x daily - 7 x weekly - 2 sets - 10 reps - Standing Hip Extension with Unilateral Counter Support  - 1 x daily - 7 x weekly - 2 sets - 10 reps - Standing Hip Abduction with Unilateral Counter Support  - 1 x daily - 7 x weekly - 2 sets - 10 reps - Standing March with Unilateral Counter Support  - 1 x daily - 7 x weekly - 2 sets - 10 reps - Standing Knee Flexion with Unilateral Counter Support   - 1 x daily - 7 x weekly - 2 sets - 10 reps  ASSESSMENT:  CLINICAL IMPRESSION: PT tx. Session focused on LE strengthening/ balance ex.  Pt. understands LE ex. Program and instructed to complete daily as tolerated and look for ankle wts. At home.  Increase c/o R hip pain with standing/ walking tasks.  Pt. Presents with limited overall endurance with daily tasks/ standing and motivated to develop a strengthening ex. Program.  Pt. will benefit from skilled PT services to increase B LE muscle strength to improve overall endurance with daily household/ community tasks.     OBJECTIVE IMPAIRMENTS: decreased activity tolerance, decreased balance, decreased endurance, decreased mobility, difficulty walking, decreased ROM, decreased strength, dizziness, and postural dysfunction.   ACTIVITY LIMITATIONS: carrying, lifting, bending, standing, squatting, and locomotion level  PARTICIPATION LIMITATIONS: meal prep, cleaning, laundry, and community activity  PERSONAL FACTORS: Fitness and Past/current experiences are also affecting patient's functional outcome.   REHAB POTENTIAL: Good  CLINICAL DECISION MAKING: Evolving/moderate complexity  EVALUATION COMPLEXITY: High   GOALS: Goals reviewed with patient? Yes  SHORT TERM GOALS: Target date: 10/27/23 Pt. Independent with HEP to increase B LE muscle strength 1/2 muscle grade to improve mobility.    Baseline:  see above Goal status: INITIAL  2.  Pt. Will complete 45 minutes of exercise/ PT with on rest breaks to improve overall endurance with  daily/ community tasks.  Baseline:  limited endurance Goal status: INITIAL  LONG TERM GOALS: Target date: 11/17/23  Pt. Will increase LEFS 15 points to improve pain-free mobility with daily tasks.   Baseline: 3/17: 34 out of 80.   Goal status: INITIAL  2.  Pt. Will complete 5xSTS in <20 sec. With no UE assist on armrest to improve independence with transfers.   Baseline: 3.5x with no UE assist.  19.22  seconds.   Goal status: INITIAL  3.  Pt. Will progress to independent gym [facility]) based ex. Program to maintain LE strength gains to improve daily mobility/ endurance.  Baseline: pt. Member at [facility] but not attending.  Goal status: INITIAL   PLAN:  PT FREQUENCY: 2x/week  PT DURATION: 6 weeks  PLANNED INTERVENTIONS: 97110-Therapeutic exercises, 97530- Therapeutic activity, [id number]- Neuromuscular re-education, 97535- Self Care, [id number]- Manual therapy, [phone]- Gait training, Patient/Family education, Balance training, Stair training, Cryotherapy, and Moist heat  PLAN FOR NEXT SESSION: Progress HEP/ walking endurance  Cammie Mcgee, PT, DPT # [phone] 10/20/2023, 12:33 PM

## 2023-10-22 ENCOUNTER — Encounter: Payer: Self-pay | Admitting: Physical Therapy

## 2023-10-22 ENCOUNTER — Ambulatory Visit: Payer: PPO | Admitting: Physical Therapy

## 2023-10-22 DIAGNOSIS — M6281 Muscle weakness (generalized): Secondary | ICD-10-CM | POA: Diagnosis not present

## 2023-10-22 DIAGNOSIS — R278 Other lack of coordination: Secondary | ICD-10-CM

## 2023-10-22 DIAGNOSIS — R269 Unspecified abnormalities of gait and mobility: Secondary | ICD-10-CM

## 2023-10-22 DIAGNOSIS — R2689 Other abnormalities of gait and mobility: Secondary | ICD-10-CM

## 2023-10-22 DIAGNOSIS — R5382 Chronic fatigue, unspecified: Secondary | ICD-10-CM

## 2023-10-22 NOTE — Therapy (Signed)
 OUTPATIENT PHYSICAL THERAPY LOWER EXTREMITY TREATMENT  Patient Name: Lynn Mann MRN: [id number] DOB:27-Nov-1950, 73 y.o., female Today's Date: 10/22/2023  END OF SESSION:   PT End of Session - 10/22/23 1042     Visit Number 6    Number of Visits 12    Date for PT Re-Evaluation 11/17/23    PT Start Time 1042    PT Stop Time 1116    PT Time Calculation (min) 34 min             Past Medical History:  Diagnosis Date   Arthritis    C. difficile diarrhea    age 27s-40s    Chicken pox    Cholecystitis 11/2011   Did not require sgy - Dr. Sammuel Cooper - [facility]  (cholelithiasis)   H/O Clostridium difficile infection    IBS (irritable bowel syndrome)    MRSA exposure 2005   Spider bite   MVP (mitral valve prolapse)    Stable - Dr. Lady Gary   Rheumatic fever    Past Surgical History:  Procedure Laterality Date   CATARACT EXTRACTION  [id number]   MUSCLE BIOPSY     Patient Active Problem List   Diagnosis Date Noted   Pelvic congestive syndrome 01/14/2023   Estrogen deficiency 06/23/2022   Nocturia 03/12/2022   Sleep apnea 03/12/2022   Muscle weakness 03/11/2022   Joint ache 02/11/2022   Ankle pain 02/05/2022   Fever 12/26/2021   Abdominal pain 12/26/2021   Cough 09/07/2021   Right shoulder pain 07/15/2021   Hematoma 02/11/2021   Ankle swelling 11/26/2020   Low back pain 11/22/2020   Hip pain, right 11/22/2020   Light headedness 06/24/2020   Hearing loss 06/24/2020   Binocular vision disorder with diplopia 06/24/2020   Stress 05/17/2020   Chest pain 05/16/2020   Welcome to Medicare preventive visit 06/20/2019   Viral syndrome 01/30/2019   Itching 07/21/2017   Asthma 07/16/2016   Urinary incontinence 07/16/2016   SOB (shortness of breath) 04/01/2016   Bilateral shoulder pain 02/24/2016   Fatigue 01/28/2016   Neck fullness 01/28/2016   Routine general medical examination at a health care facility 07/25/2015   Health care maintenance 10/09/2014   Neck pain  11/26/2013   Hypercholesterolemia 11/26/2013   History of colonic polyps 05/11/2013   GERD (gastroesophageal reflux disease) 12/03/2012   Cholelithiasis 11/07/2012   IBS (irritable bowel syndrome) 11/07/2012   History of rheumatic fever 08/28/2012   MVP (mitral valve prolapse) 08/28/2012    PCP: Dale [place], MD  REFERRING PROVIDER: Dale [place], MD  REFERRING DIAG:    R53.83 (ICD-10-CM) - Other fatigue  M62.81 (ICD-10-CM) - Muscle weakness    THERAPY DIAG:  Muscle weakness (generalized)  Gait difficulty  Chronic fatigue  Other abnormalities of gait and mobility  Other lack of coordination  Rationale for Evaluation and Treatment: Rehabilitation  ONSET DATE: chronic (>1 year)  SUBJECTIVE:   SUBJECTIVE STATEMENT: Got Covid 5 days before Christmas in 2023 and reports several complications (Gastritis/ Esophagitis/ 1 hiatal hernia and 1 umbilical hernia/ GERD).  Pt. Reports she is tired and out of shape.  Pt. Reports she has no endurance and requires seated rest breaks.  Pt. States she has pelvic congestion and feels weak in core.    PERTINENT HISTORY: Enjoys making vision boards.   Pt. Is busy with maintaining household chores.  Goes to meeting at [employer] center and [employer].  Pt. Belongs to the [facility].    PAIN:  Are you having pain? No  PRECAUTIONS: None  RED FLAGS: None   WEIGHT BEARING RESTRICTIONS: No  FALLS:  Has patient fallen in last 6 months? No  LIVING ENVIRONMENT: Lives with: lives alone Lives in: House/apartment Stairs: Yes: External: 1 steps; none Has following equipment at home: None  OCCUPATION: Retired  PLOF: Independent  PATIENT GOALS: Increase LE and core muscle i and improve overall energy.    NEXT MD VISIT: PRN (blood work in April).    OBJECTIVE:  Note: Objective measures were completed at Evaluation unless otherwise noted.  DIAGNOSTIC FINDINGS: N/A  PATIENT SURVEYS:  LEFS TBD next tx. session  COGNITION: Overall  cognitive status: Within functional limits for tasks assessed    SENSATION: WFL  EDEMA:  NT  MUSCLE LENGTH: Hamstrings: Right 80 deg; Left 80 deg Thomas test: NT  POSTURE: rounded shoulders and forward head  PALPATION: NT  LOWER EXTREMITY ROM:  Active ROM Right eval Left eval  Hip flexion [facility]  Hip extension    Hip abduction    Hip adduction    Hip internal rotation    Hip external rotation    Knee flexion [facility]  Knee extension [facility]  Ankle dorsiflexion [facility] WFL  Ankle plantarflexion [facility] WFL  Ankle inversion    Ankle eversion     (Blank rows = not tested)  LOWER EXTREMITY MMT:  MMT Right eval Left eval  Hip flexion 4- 4-  Hip extension    Hip abduction 4 4  Hip adduction    Hip internal rotation    Hip external rotation    Knee flexion 4+ 4+  Knee extension 5 5  Ankle dorsiflexion 4 4  Ankle plantarflexion 4 4  Ankle inversion    Ankle eversion     (Blank rows = not tested)  L anterior thigh discomfort with bridging.    LOWER EXTREMITY SPECIAL TESTS:  NT  FUNCTIONAL TESTS:  5 times sit to stand:    Completed 3.5x with no UE assist.  19.22 seconds.    GAIT: Distance walked: in clinic Assistive device utilized: None Level of assistance: Complete Independence Comments: Forward posture with recip. Gait pattern.  Limited arm swing noted.  Consistent step length/ BOS noted.   Prolonged standing will increase nausea (>10 minutes)- no known cause.               10/13/23: LEFS: 34 out of 80                                                                                                                   TREATMENT DATE: 10/22/2023  Subjective:  Pt. States her R hip felt good all day yesterday until about 10PM.  Pt. Has not attended [employer] at this time.  Pt. Micah Flesher out to look for a bathing suit but did not purchase yet.  Pt. Reports 2.5/10 R glut./ R knee pain this morning.     There.ex.:  Nustep (seat #11 at L3)- B UE/LE for 10 min.  MH to low  back ("  feels good").  Discussed R knee pain/ tracking and using B UE to offset R LE wt. Bearing on Nustep.    Standing hip ex.: marching in //-bars/ hamstring curls/ heel raises (feet slightly ER but improved since last tx.) 20x.    Discussed supine based/ core ex.  Supine TrA ex.: marching/ bridging with proper technique.  Good technique with ex. and added to HEP.  There.act.:  Walking in hallway (3 laps) with consistent recip. Step pattern/ upright posture/ arm swing.  Added alt. UE/LE recip. Touches.    5xSTS: 26.65 sec.  (Challenged with last 2 reps).  No UE support required.  Discussed technique/ posture with standing.     NOT TODAY Tandem stance: >20 sec.   TRX squats: 10x2.    Seated GTB marching/ hip abduction 20x each.  Seated heel and toe raises 20x.    Supine LE generalized stretches with focus on hamstring/ quads.    Supine L/R SLR/ marching 10x each.  Supine bolster bridging 10x with cuing to maintain core muscle activation.   Resisted gait 2BTB 4x all 4-planes in //-bars (no UE assist)- pt. Challenged with recip. Step pattern.  SBA for safety/ cuing.  Slight increase in R hip discomfort.    Reassessment SLS <10 sec. On L/R.    PATIENT EDUCATION:  Education details: Air traffic controller Person educated: Patient Education method: Medical illustrator Education comprehension: verbalized understanding and returned demonstration  HOME EXERCISE PROGRAM: Access Code: [facility] URL: [email or web address] Date: 10/08/2023 Prepared by: Dorene Grebe  Exercises - Mini Squat with Counter Support  - 1 x daily - 7 x weekly - 2 sets - 10 reps - Standing Hip Extension with Unilateral Counter Support  - 1 x daily - 7 x weekly - 2 sets - 10 reps - Standing Hip Abduction with Unilateral Counter Support  - 1 x daily - 7 x weekly - 2 sets - 10 reps - Standing March with Unilateral Counter Support  - 1 x daily - 7 x weekly - 2 sets - 10 reps - Standing  Knee Flexion with Unilateral Counter Support  - 1 x daily - 7 x weekly - 2 sets - 10 reps  ASSESSMENT:  CLINICAL IMPRESSION: PT tx. Session focused on LE strengthening/ balance ex/ reassessment of gait/ 5xSTS.  Pt. still looking for a bathing suit for aquatic ex. And ankle wts. For HEP. Slight increase in R knee pain during Nustep and less overall R hip pain during tx.  Pt. Presents with limited overall endurance with daily tasks/ standing and motivated to develop a strengthening ex. Program.  PT discussed core based ex. In supine position.  Pt. will benefit from skilled PT services to increase B LE muscle strength to improve overall endurance with daily household/ community tasks.     OBJECTIVE IMPAIRMENTS: decreased activity tolerance, decreased balance, decreased endurance, decreased mobility, difficulty walking, decreased ROM, decreased strength, dizziness, and postural dysfunction.   ACTIVITY LIMITATIONS: carrying, lifting, bending, standing, squatting, and locomotion level  PARTICIPATION LIMITATIONS: meal prep, cleaning, laundry, and community activity  PERSONAL FACTORS: Fitness and Past/current experiences are also affecting patient's functional outcome.   REHAB POTENTIAL: Good  CLINICAL DECISION MAKING: Evolving/moderate complexity  EVALUATION COMPLEXITY: High   GOALS: Goals reviewed with patient? Yes  SHORT TERM GOALS: Target date: 10/27/23 Pt. Independent with HEP to increase B LE muscle strength 1/2 muscle grade to improve mobility.    Baseline:  see above Goal status: INITIAL  2.  Pt. Will complete 45 minutes  of exercise/ PT with on rest breaks to improve overall endurance with daily/ community tasks.  Baseline:  limited endurance Goal status: INITIAL  LONG TERM GOALS: Target date: 11/17/23  Pt. Will increase LEFS 15 points to improve pain-free mobility with daily tasks.   Baseline: 3/17: 34 out of 80.   Goal status: INITIAL  2.  Pt. Will complete 5xSTS in <20 sec.  With no UE assist on armrest to improve independence with transfers.   Baseline: 3.5x with no UE assist.  19.22 seconds.   Goal status: INITIAL  3.  Pt. Will progress to independent gym [facility]) based ex. Program to maintain LE strength gains to improve daily mobility/ endurance.  Baseline: pt. Member at [employer] but not attending.  Goal status: INITIAL   PLAN:  PT FREQUENCY: 2x/week  PT DURATION: 6 weeks  PLANNED INTERVENTIONS: 97110-Therapeutic exercises, 97530- Therapeutic activity, [id number]- Neuromuscular re-education, 97535- Self Care, [id number]- Manual therapy, [phone]- Gait training, Patient/Family education, Balance training, Stair training, Cryotherapy, and Moist heat  PLAN FOR NEXT SESSION: Progress HEP/ walking endurance.  Discussed decreasing PT frequency to 1x/week with focus on [facility] to [facility], PT, DPT # [phone] 10/22/2023, 12:17 PM

## 2023-10-29 DIAGNOSIS — E7800 Pure hypercholesterolemia, unspecified: Secondary | ICD-10-CM | POA: Diagnosis not present

## 2023-10-29 DIAGNOSIS — I341 Nonrheumatic mitral (valve) prolapse: Secondary | ICD-10-CM | POA: Diagnosis not present

## 2023-10-29 DIAGNOSIS — R0602 Shortness of breath: Secondary | ICD-10-CM | POA: Diagnosis not present

## 2023-10-31 ENCOUNTER — Ambulatory Visit: Payer: Self-pay

## 2023-10-31 NOTE — Telephone Encounter (Signed)
 Copied from CRM [id number]. Topic: Clinical - Red Word Triage >> Oct 31, 2023 12:17 PM Denese Killings wrote: Red Word that prompted transfer to Nurse Triage: Patient is having back pain, hard to walk, and pain in left ear. She said sometimes the pain in her left ear is bearable and other times its not. She thinks she may have a UTI. She also states that she gets the urge to urinate but not alot comes out.   Chief Complaint: Multiple complaints--Left ear pain x 2 weeks, Fevers,  Symptoms: left ear pain, fevers, pain in the bony part behind her left ear,  Frequency: x 2 weeks, x 3 weeks,  Pertinent Negatives: Patient denies blood in urine, weakness, numbness, or problems with bowel/bladder control, eye pain, stiff neck, sore throat Disposition: [] ED /[] Urgent Care (no appt availability in office) / [] Appointment(In office/virtual)/ []  [facility]/ [] Home Care/ [x] Refused Recommended Disposition /[] [facility]/ []  Follow-up with PCP Additional Notes: Patient called and advised  Patient states left ear pain x 2 weeks. Wax found on pillow in the mornings. Patient thought she felt something in her left ear and put her finger in there and scratched it at one point.  Patient also states she has pains in parts of her head--bony parts behind left ear, pain in the area at the back of her neck near the back left side of her head---off and on pain 4 out of 10 pain.----x 3-4 days Fevers for a few days during the week and then it goes away and then comes back x 3 weeks.  Patient states that she has had six sessions of physical therapy and she has pain---pain just to the right of the spine she attributes to physical therapy.--comes and goes. Patient also is unsure if she might have a UTI.  She states her urine is dark off and on--she isn't sure if this is part of her back pain.--- x 3 days--comes and goes With patient's multiple complaints at this time---and plus having the back pain and urinary  symptoms, along with having head pain, off and on fevers--it is at least recommended that she sees a provider in the next 4 hours. There are no appointments in her primary care office with any providers and it is recommended that she be seen in the next 4 hours.  Patient is advised that Urgent Care is a possibility and she declines wanting to go to any Urgent Cares due to past experiences.  This RN also advised the patient that there is also the Emergency Room as an option to be further evaluated for her multiple complaints.  Patient denies wanting to go to the Emergency Room either at this time.  Patient just wants to see her PCP.  She is advised that the safest course of action at this time is to see someone and not wait over the weekend.  Patient is also advised that this message will be sent to her provider and if anything worsens, she is encouraged multiple times to seek immediate medical attention if she gets worse.  Patient states that she will consider it but she really did not want to     Reason for Disposition  [1] Pain or burning with passing urine (urination) AND [2] flank pain (i.e., in side, below ribs and above hip)  Answer Assessment - Initial Assessment Questions 1. ONSET: "When did the pain begin?"      3 days 2. LOCATION: "Where does it hurt?" (upper, mid or lower  back)     "3 inches up my spine, aches all the way across, and mostly on the right side and also a little higher above her waist" 3. SEVERITY: "How bad is the pain?"  (e.g., Scale 1-10; mild, moderate, or severe)   - MILD (1-3): Doesn't interfere with normal activities.    - MODERATE (4-7): Interferes with normal activities or awakens from sleep.    - SEVERE (8-10): Excruciating pain, unable to do any normal activities.      Overall ache is 5 out of 10 4. PATTERN: "Is the pain constant?" (e.g., yes, no; constant, intermittent)      Comes and goes 5. RADIATION: "Does the pain shoot into your legs or somewhere else?"      Pain up higher never radiates anywhere 6. CAUSE:  "What do you think is causing the back pain?"      unsure 7. BACK OVERUSE:  "Any recent lifting of heavy objects, strenuous work or exercise?"     Physical therapy recently 8. MEDICINES: "What have you taken so far for the pain?" (e.g., nothing, acetaminophen, NSAIDS)     Aleve--ran out of this 9. NEUROLOGIC SYMPTOMS: "Do you have any weakness, numbness, or problems with bowel/bladder control?"     No 10. OTHER SYMPTOMS: "Do you have any other symptoms?" (e.g., fever, abdomen pain, burning with urination, blood in urine)       Patient states fevers x 3 weeks, some lower abdominal pain off and on for 4 days  Answer Assessment - Initial Assessment Questions 1. LOCATION: "Where does it hurt?"      Bony part behind left ear and left back side of head 2. ONSET: "When did the headache start?" (Minutes, hours or days)      X 3-4 days 3. PATTERN: "Does the pain come and go, or has it been constant since it started?"     Comes and goes   sore on palpation 4. SEVERITY: "How bad is the pain?" and "What does it keep you from doing?"  (e.g., Scale 1-10; mild, moderate, or severe)   - MILD (1-3): doesn't interfere with normal activities    - MODERATE (4-7): interferes with normal activities or awakens from sleep    - SEVERE (8-10): excruciating pain, unable to do any normal activities        4 5. RECURRENT SYMPTOM: "Have you ever had headaches before?" If Yes, ask: "When was the last time?" and "What happened that time?"      no 6. CAUSE: "What do you think is causing the headache?"     unsure 7. MIGRAINE: "Have you been diagnosed with migraine headaches?" If Yes, ask: "Is this headache similar?"      no 8. HEAD INJURY: "Has there been any recent injury to the head?"      no 9. OTHER SYMPTOMS: "Do you have any other symptoms?" (fever, stiff neck, eye pain, sore throat, cold symptoms)     no  Protocols used: Back Pain-A-AH, Headache-A-AH

## 2023-10-31 NOTE — Telephone Encounter (Signed)
Called patient. Unable to reach 

## 2023-10-31 NOTE — Telephone Encounter (Signed)
 Attempted to call [facility] and advise them of patient's current complaints and refusal of urgent care at this time--No answer at [facility] at this time.

## 2023-10-31 NOTE — Telephone Encounter (Signed)
 Copied from CRM [id number]. Topic: Clinical - Red Word Triage >> Oct 31, 2023 12:17 PM Denese Killings wrote: Red Word that prompted transfer to Nurse Triage: Patient is having back pain, hard to walk, and pain in left ear. She said sometimes the pain in her left ear is bearable and other times its not. She thinks she may have a UTI. She also states that she gets the urge to urinate but not alot comes out.  Called back and asked for an apt.  Protocol states does not want to go to UC.  Instructed to go to UC for treatment.  Denies questions.

## 2023-11-05 ENCOUNTER — Ambulatory Visit: Admitting: Physical Therapy

## 2023-11-05 NOTE — Telephone Encounter (Signed)
 Patient has appointment on 11/11/23.

## 2023-11-10 ENCOUNTER — Encounter: Payer: Self-pay | Admitting: Physical Therapy

## 2023-11-10 ENCOUNTER — Ambulatory Visit: Attending: Internal Medicine | Admitting: Physical Therapy

## 2023-11-10 DIAGNOSIS — R2681 Unsteadiness on feet: Secondary | ICD-10-CM | POA: Diagnosis not present

## 2023-11-10 DIAGNOSIS — M6281 Muscle weakness (generalized): Secondary | ICD-10-CM | POA: Insufficient documentation

## 2023-11-10 DIAGNOSIS — M5459 Other low back pain: Secondary | ICD-10-CM | POA: Diagnosis not present

## 2023-11-10 DIAGNOSIS — R278 Other lack of coordination: Secondary | ICD-10-CM | POA: Insufficient documentation

## 2023-11-10 DIAGNOSIS — R2689 Other abnormalities of gait and mobility: Secondary | ICD-10-CM | POA: Diagnosis not present

## 2023-11-10 DIAGNOSIS — R269 Unspecified abnormalities of gait and mobility: Secondary | ICD-10-CM | POA: Insufficient documentation

## 2023-11-10 DIAGNOSIS — R5382 Chronic fatigue, unspecified: Secondary | ICD-10-CM | POA: Diagnosis not present

## 2023-11-10 NOTE — Therapy (Signed)
 OUTPATIENT PHYSICAL THERAPY LOWER EXTREMITY TREATMENT  Patient Name: Lynn Mann MRN: [id number] DOB:[birth year], 73 y.o., female Today's Date: 11/11/2023  END OF SESSION:   PT End of Session - 11/10/23 1351     Visit Number 7    Number of Visits 12    Date for PT Re-Evaluation 11/17/23    PT Start Time 1347    PT Stop Time 1437    PT Time Calculation (min) 50 min             Past Medical History:  Diagnosis Date   Arthritis    C. difficile diarrhea    age 38s-40s    Chicken pox    Cholecystitis 11/2011   Did not require sgy - Dr. Karena Ota - [facility]  (cholelithiasis)   H/O Clostridium difficile infection    IBS (irritable bowel syndrome)    MRSA exposure 2005   Spider bite   MVP (mitral valve prolapse)    Stable - Dr. Cara Chancellor   Rheumatic fever    Past Surgical History:  Procedure Laterality Date   CATARACT EXTRACTION  [id number]   MUSCLE BIOPSY     Patient Active Problem List   Diagnosis Date Noted   Pelvic congestive syndrome 01/14/2023   Estrogen deficiency 06/23/2022   Nocturia 03/12/2022   Sleep apnea 03/12/2022   Muscle weakness 03/11/2022   Joint ache 02/11/2022   Ankle pain 02/05/2022   Fever 12/26/2021   Abdominal pain 12/26/2021   Cough 09/07/2021   Right shoulder pain 07/15/2021   Hematoma 02/11/2021   Ankle swelling 11/26/2020   Low back pain 11/22/2020   Hip pain, right 11/22/2020   Light headedness 06/24/2020   Hearing loss 06/24/2020   Binocular vision disorder with diplopia 06/24/2020   Stress 05/17/2020   Chest pain 05/16/2020   Welcome to Medicare preventive visit 06/20/2019   Viral syndrome 01/30/2019   Itching 07/21/2017   Asthma 07/16/2016   Urinary incontinence 07/16/2016   SOB (shortness of breath) 04/01/2016   Bilateral shoulder pain 02/24/2016   Fatigue 01/28/2016   Neck fullness 01/28/2016   Routine general medical examination at a health care facility 07/25/2015   Health care maintenance 10/09/2014   Neck pain  11/26/2013   Hypercholesterolemia 11/26/2013   History of colonic polyps 05/11/2013   GERD (gastroesophageal reflux disease) 12/03/2012   Cholelithiasis 11/07/2012   IBS (irritable bowel syndrome) 11/07/2012   History of rheumatic fever 08/28/2012   MVP (mitral valve prolapse) 08/28/2012    PCP: Dellar Fenton, MD  REFERRING PROVIDER: Dellar Fenton, MD  REFERRING DIAG:    R53.83 (ICD-10-CM) - Other fatigue  M62.81 (ICD-10-CM) - Muscle weakness    THERAPY DIAG:  Muscle weakness (generalized)  Gait difficulty  Chronic fatigue  Other abnormalities of gait and mobility  Other lack of coordination  Rationale for Evaluation and Treatment: Rehabilitation  ONSET DATE: chronic (>1 year)  SUBJECTIVE:   SUBJECTIVE STATEMENT: Got Covid 5 days before Christmas in 2023 and reports several complications (Gastritis/ Esophagitis/ 1 hiatal hernia and 1 umbilical hernia/ GERD).  Pt. Reports she is tired and out of shape.  Pt. Reports she has no endurance and requires seated rest breaks.  Pt. States she has pelvic congestion and feels weak in core.    PERTINENT HISTORY: Enjoys making vision boards.   Pt. Is busy with maintaining household chores.  Goes to meeting at [employer] center and [employer].  Pt. Belongs to the [facility].    PAIN:  Are you having pain? No  PRECAUTIONS: None  RED FLAGS: None   WEIGHT BEARING RESTRICTIONS: No  FALLS:  Has patient fallen in last 6 months? No  LIVING ENVIRONMENT: Lives with: lives alone Lives in: House/apartment Stairs: Yes: External: 1 steps; none Has following equipment at home: None  OCCUPATION: Retired  PLOF: Independent  PATIENT GOALS: Increase LE and core muscle i and improve overall energy.    NEXT MD VISIT: PRN (blood work in April).    OBJECTIVE:  Note: Objective measures were completed at Evaluation unless otherwise noted.  DIAGNOSTIC FINDINGS: N/A  PATIENT SURVEYS:  LEFS TBD next tx. session  COGNITION: Overall  cognitive status: Within functional limits for tasks assessed    SENSATION: WFL  EDEMA:  NT  MUSCLE LENGTH: Hamstrings: Right 80 deg; Left 80 deg Thomas test: NT  POSTURE: rounded shoulders and forward head  PALPATION: NT  LOWER EXTREMITY ROM:  Active ROM Right eval Left eval  Hip flexion [facility]  Hip extension    Hip abduction    Hip adduction    Hip internal rotation    Hip external rotation    Knee flexion [facility]  Knee extension [facility]  Ankle dorsiflexion [facility] WFL  Ankle plantarflexion [facility] WFL  Ankle inversion    Ankle eversion     (Blank rows = not tested)  LOWER EXTREMITY MMT:  MMT Right eval Left eval  Hip flexion 4- 4-  Hip extension    Hip abduction 4 4  Hip adduction    Hip internal rotation    Hip external rotation    Knee flexion 4+ 4+  Knee extension 5 5  Ankle dorsiflexion 4 4  Ankle plantarflexion 4 4  Ankle inversion    Ankle eversion     (Blank rows = not tested)  L anterior thigh discomfort with bridging.    LOWER EXTREMITY SPECIAL TESTS:  NT  FUNCTIONAL TESTS:  5 times sit to stand:    Completed 3.5x with no UE assist.  19.22 seconds.    GAIT: Distance walked: in clinic Assistive device utilized: None Level of assistance: Complete Independence Comments: Forward posture with recip. Gait pattern.  Limited arm swing noted.  Consistent step length/ BOS noted.   Prolonged standing will increase nausea (>10 minutes)- no known cause.               10/13/23: LEFS: 34 out of 80  10/22/23:  5xSTS: 26.65 sec.  (Challenged with last 2 reps).  No UE support required.                                                                                                                     TREATMENT DATE: 11/11/2023  Subjective:  Pt. Has not attended [employer] at this time and states she lost her car keys for a week.  Pt. Found her car keys this morning in house.  Pt. Reports difficulty with grasping/ opening jars due to weakness (more recent issue).   Pt. Reports 2/10 R knee pain.  No pain in R hip at this time but reports difficulty bending down.  Pt. States she hasn't done any exercise over the past week.    LEFS: 19 out of 80 (marked regression in outcome measure score since last reassessment secondary to pain/ gait concerns).  Pt. Reports R hip "feels like it is sticking up" and higher on R.  (-) for LLD in supine position.  Grip strength: R 38.6#, L 28#.  Slight hand tremors noted.    There.ex.:  Nustep (seat #11 at L0)- B UE/LE for 10 min.  MH to low back ("feels good").  Discussed R knee pain/ limitations with household tasks.  O2 sat.: 91%, HR: 93 bpm.    Standing hip ex.: marching in //-bars/ hip abduction/ extension (feet slightly ER but improved since last tx.) 20x.   Supine SLR: 5x on L/R.  Challenged/ limited to <30 deg. On L/R.    Discussed supine based/ core ex.  Supine TrA ex.: marching/ bridging with proper technique.  Good technique with ex. and added to HEP.  There.act.:  Walking in //-bars/ hallway with consistent recip. Step pattern/ upright posture/ arm swing.  Pt. Reports several LOB with changes in BOS/ R knee and lower leg control.    STS from gray chair:  knee pain with eccentric quad control during sitting/ requires 1 UE assist for safety.  Challenged with STS with no UE assist.   Walking in clinic/ outside with use of SPC and 2-point gait pattern.  PT encourages pt. To use SPC at home on L side to counter balance R LE during gait.       PATIENT EDUCATION:  Education details: Air traffic controller Person educated: Patient Education method: Medical illustrator Education comprehension: verbalized understanding and returned demonstration  HOME EXERCISE PROGRAM: Access Code: [email or web address] URL: [email or web address] Date: 10/08/2023 Prepared by: Dorene Grebe  Exercises - Mini Squat with Counter Support  - 1 x daily - 7 x weekly - 2 sets - 10 reps - Standing Hip Extension with  Unilateral Counter Support  - 1 x daily - 7 x weekly - 2 sets - 10 reps - Standing Hip Abduction with Unilateral Counter Support  - 1 x daily - 7 x weekly - 2 sets - 10 reps - Standing March with Unilateral Counter Support  - 1 x daily - 7 x weekly - 2 sets - 10 reps - Standing Knee Flexion with Unilateral Counter Support  - 1 x daily - 7 x weekly - 2 sets - 10 reps  ASSESSMENT:  CLINICAL IMPRESSION: PT tx. Session focused on LE strengthening/ balance ex/ reassessment of gait with SPC/ STS.  Pt. Has lots of complaints about R hip/knee/ gait and balance.  Limited gait/ endurance with walking distance (unable to walk to get mailbox at home).  Pt. Walking with R antalgic gait with occasional shuffling gait/ changes in step length/ limited heel strike.  PT encourages pt. To use SPC with a 2-point gait pattern to decrease R LE pain/ improve balance.  Pt. Presents with limited overall endurance with daily tasks/ standing and motivated to develop a strengthening ex. Program.  PT encourages pt. To start gym based there.ex. and aquatic ex (walking/ classes).  Pt. will benefit from skilled PT services to increase B LE muscle strength to improve overall endurance with daily household/ community tasks.     OBJECTIVE IMPAIRMENTS: decreased activity tolerance, decreased balance, decreased endurance, decreased mobility, difficulty walking, decreased ROM, decreased strength, dizziness, and postural dysfunction.  ACTIVITY LIMITATIONS: carrying, lifting, bending, standing, squatting, and locomotion level  PARTICIPATION LIMITATIONS: meal prep, cleaning, laundry, and community activity  PERSONAL FACTORS: Fitness and Past/current experiences are also affecting patient's functional outcome.   REHAB POTENTIAL: Good  CLINICAL DECISION MAKING: Evolving/moderate complexity  EVALUATION COMPLEXITY: High   GOALS: Goals reviewed with patient? Yes  SHORT TERM GOALS: Target date: 10/27/23 Pt. Independent with HEP to  increase B LE muscle strength 1/2 muscle grade to improve mobility.    Baseline:  see above Goal status: Not met  2.  Pt. Will complete 45 minutes of exercise/ PT with on rest breaks to improve overall endurance with daily/ community tasks.  Baseline:  limited endurance Goal status: Not met  LONG TERM GOALS: Target date: 11/17/23  Pt. Will increase LEFS 15 points to improve pain-free mobility with daily tasks.   Baseline: 3/17: 34 out of 80.  4/14: 19 out of 80 (regression secondary to pain) Goal status: Not met  2.  Pt. Will complete 5xSTS in <20 sec. With no UE assist on armrest to improve independence with transfers.   Baseline: 3.5x with no UE assist.  19.22 seconds.  3/26: 27 sec.   Goal status: Not met  3.  Pt. Will progress to independent gym [facility]) based ex. Program to maintain LE strength gains to improve daily mobility/ endurance.  Baseline: pt. Member at [facility] but not attending.  Goal status: Not met   PLAN:  PT FREQUENCY: 2x/week  PT DURATION: 6 weeks  PLANNED INTERVENTIONS: 97110-Therapeutic exercises, 97530- Therapeutic activity, [id number]- Neuromuscular re-education, 97535- Self Care, 13086- Manual therapy, [phone]- Gait training, Patient/Family education, Balance training, Stair training, Cryotherapy, and Moist heat  PLAN FOR NEXT SESSION: Progress HEP/ walking endurance.  Discussed decreasing PT frequency to 1x/week with focus on going to [facility], PT, DPT # [phone] 11/11/2023, 7:17 AM

## 2023-11-11 ENCOUNTER — Ambulatory Visit (INDEPENDENT_AMBULATORY_CARE_PROVIDER_SITE_OTHER): Payer: PPO | Admitting: Internal Medicine

## 2023-11-11 VITALS — BP 128/72 | HR 86 | Temp 97.9°F | Resp 16 | Ht 67.0 in | Wt 188.0 lb

## 2023-11-11 DIAGNOSIS — N9489 Other specified conditions associated with female genital organs and menstrual cycle: Secondary | ICD-10-CM | POA: Diagnosis not present

## 2023-11-11 DIAGNOSIS — K219 Gastro-esophageal reflux disease without esophagitis: Secondary | ICD-10-CM | POA: Diagnosis not present

## 2023-11-11 DIAGNOSIS — E7800 Pure hypercholesterolemia, unspecified: Secondary | ICD-10-CM

## 2023-11-11 DIAGNOSIS — M13861 Other specified arthritis, right knee: Secondary | ICD-10-CM | POA: Diagnosis not present

## 2023-11-11 DIAGNOSIS — M5441 Lumbago with sciatica, right side: Secondary | ICD-10-CM | POA: Diagnosis not present

## 2023-11-11 DIAGNOSIS — H9202 Otalgia, left ear: Secondary | ICD-10-CM

## 2023-11-11 DIAGNOSIS — J4520 Mild intermittent asthma, uncomplicated: Secondary | ICD-10-CM

## 2023-11-11 DIAGNOSIS — Z860100 Personal history of colon polyps, unspecified: Secondary | ICD-10-CM

## 2023-11-11 DIAGNOSIS — M25569 Pain in unspecified knee: Secondary | ICD-10-CM | POA: Diagnosis not present

## 2023-11-11 DIAGNOSIS — M25561 Pain in right knee: Secondary | ICD-10-CM | POA: Diagnosis not present

## 2023-11-11 DIAGNOSIS — M25551 Pain in right hip: Secondary | ICD-10-CM | POA: Diagnosis not present

## 2023-11-11 NOTE — Progress Notes (Signed)
 Subjective:    Patient ID: Lynn Mann, female    DOB: [birth year], 73 y.o.   MRN: [id number]  Patient here for  Chief Complaint  Patient presents with   Medical Management of Chronic Issues    HPI Here for a scheduled follow up - here for follow up regarding hypercholesterolemia, sleep apnea and GERD. Saw cardiology 04/30/23 - reported DOE - recommended stress echo and added toprol XL 12.5mg  prn. Stress echo 05/13/23 - normal. Saw GI - colonoscopy and EGD unremarkable. No stenosis - SMA. Work up - vascular congestion. CTA did not show evidence of potential median arcuate ligament compression that was demonstrated potentially on ultrasound. Referred to vascular for second opinion. Saw Dr Vonna Guardian. Recommended vascular procedure. Desired not to schedule at that time. Discussed today. Continues to want to hold on any further vascular testing. Given patient continued to have postprandial abdominal pain immediately after eating and is having to liquefy foods - a HIDA scan scheduled. Gallbladder - EF - decreased. Have discussed cholecystectomy. Discussed again today. Wants to hold on further evaluation/procedures. Had f/u 10/29/23 - cardiology. No further cardiac diagnostic testing recommended. Felt stable. Continue crestor  and toprol. Her main complaint today is that of back pain. Reports, at times, she walks bent over. She also reports increased problems with her knee- feels like is going to "give" at times. Also some pain in the right groin. Affects her walking and activity. No urinary symptoms reported. Reported some left ear pain. On questioning and exam - appears to be localized to TMJ.    Past Medical History:  Diagnosis Date   Arthritis    C. difficile diarrhea    age 72s-40s    Chicken pox    Cholecystitis 11/2011   Did not require sgy - Dr. Karena Ota - [facility]  (cholelithiasis)   H/O Clostridium difficile infection    IBS (irritable bowel syndrome)    MRSA exposure 2005   Spider bite   MVP  (mitral valve prolapse)    Stable - Dr. Cara Chancellor   Rheumatic fever    Past Surgical History:  Procedure Laterality Date   CATARACT EXTRACTION  [id number]   MUSCLE BIOPSY     Family History  Problem Relation Age of Onset   Arthritis Mother    Stroke Mother    Diabetes Mother    Hypertension Mother    Arthritis Father    Stroke Father    Diabetes Paternal Grandmother    Cancer Paternal Uncle        colon   Heart disease Other        maternal and paternal side   Breast cancer Paternal Aunt    Breast cancer Cousin    Breast cancer Cousin        female cousin   Breast cancer Cousin    Social History   Socioeconomic History   Marital status: Widowed    Spouse name: Not on file   Number of children: Not on file   Years of education: 16   Highest education level: Not on file  Occupational History   Occupation: Retired  Tobacco Use   Smoking status: Never    Passive exposure: Never   Smokeless tobacco: Never  Vaping Use   Vaping status: Never Used  Substance and Sexual Activity   Alcohol use: Not Currently    Comment: Rarely   Drug use: No   Sexual activity: Yes    Partners: Male    Birth control/protection: Post-menopausal  Other Topics Concern   Not on file  Social History Narrative   Widowed    2 kids son and daughter   Social Drivers of Health   Financial Resource Strain: Low Risk  (07/04/2023)   Overall Financial Resource Strain (CARDIA)    Difficulty of Paying Living Expenses: Not hard at all  Food Insecurity: No Food Insecurity (07/04/2023)   Hunger Vital Sign    Worried About Running Out of Food in the Last Year: Never true    Ran Out of Food in the Last Year: Never true  Transportation Needs: No Transportation Needs (07/04/2023)   PRAPARE - Administrator, Civil Service (Medical): No    Lack of Transportation (Non-Medical): No  Physical Activity: Inactive (07/04/2023)   Exercise Vital Sign    Days of Exercise per Week: 0 days    Minutes of  Exercise per Session: 0 min  Stress: No Stress Concern Present (07/04/2023)   [employer] of Occupational Health - Occupational Stress Questionnaire    Feeling of Stress : Not at all  Social Connections: Moderately Integrated (07/04/2023)   Social Connection and Isolation Panel [NHANES]    Frequency of Communication with Friends and Family: More than three times a week    Frequency of Social Gatherings with Friends and Family: More than three times a week    Attends Religious Services: More than 4 times per year    Active Member of [employer] or Organizations: Yes    Attends Banker Meetings: More than 4 times per year    Marital Status: Widowed     Review of Systems  Constitutional:  Negative for appetite change and unexpected weight change.  HENT:  Negative for congestion and sinus pressure.   Respiratory:  Negative for cough, chest tightness and shortness of breath.   Cardiovascular:  Negative for chest pain, palpitations and leg swelling.  Gastrointestinal:  Negative for abdominal pain, diarrhea, nausea and vomiting.  Genitourinary:  Negative for difficulty urinating and dysuria.  Musculoskeletal:  Positive for back pain.       Groin pain as outlined. Knee issues.   Skin:  Negative for color change and rash.  Neurological:  Negative for dizziness.  Psychiatric/Behavioral:  Negative for agitation and dysphoric mood.        Objective:     BP 128/72   Pulse 86   Temp 97.9 F (36.6 C)   Resp 16   Ht 5\' 7"  (1.702 m)   Wt 188 lb (85.3 kg)   SpO2 98%   BMI 29.44 kg/m  Wt Readings from Last 3 Encounters:  11/11/23 188 lb (85.3 kg)  08/12/23 183 lb (83 kg)  07/04/23 175 lb (79.4 kg)    Physical Exam Vitals reviewed.  Constitutional:      General: She is not in acute distress.    Appearance: Normal appearance.  HENT:     Head: Normocephalic and atraumatic.     Right Ear: Tympanic membrane and external ear normal.     Left Ear: Tympanic membrane and  external ear normal.     Mouth/Throat:     Pharynx: No oropharyngeal exudate or posterior oropharyngeal erythema.  Eyes:     General: No scleral icterus.       Right eye: No discharge.        Left eye: No discharge.     Conjunctiva/sclera: Conjunctivae normal.  Neck:     Thyroid : No thyromegaly.  Cardiovascular:     Rate and  Rhythm: Normal rate and regular rhythm.  Pulmonary:     Effort: No respiratory distress.     Breath sounds: Normal breath sounds. No wheezing.  Abdominal:     General: Bowel sounds are normal.     Palpations: Abdomen is soft.     Tenderness: There is no abdominal tenderness.  Musculoskeletal:        General: No swelling or tenderness.     Cervical back: Neck supple. No tenderness.  Lymphadenopathy:     Cervical: No cervical adenopathy.  Skin:    Findings: No erythema or rash.  Neurological:     Mental Status: She is alert.  Psychiatric:        Mood and Affect: Mood normal.        Behavior: Behavior normal.         Outpatient Encounter Medications as of 11/11/2023  Medication Sig   albuterol  (VENTOLIN  HFA) 108 (90 Base) MCG/ACT inhaler Inhale 2 puffs into the lungs every 4 (four) hours as needed for wheezing or shortness of breath.   cetirizine  (ZYRTEC ) 10 MG tablet Take 1 tablet (10 mg total) by mouth daily as needed for allergies.   Cholecalciferol (VITAMIN D3 PO) Take 1 tablet by mouth daily.   esomeprazole  (NEXIUM ) 40 MG capsule Take 1 capsule (40 mg total) by mouth daily.   estradiol (ESTRACE) 0.1 MG/GM vaginal cream Insert fingertip unit vaginally and on urethra nightly x 2 weeks, then every other night x 2 weeks, then 2-3 times weekly for maintenance   fluticasone  (FLONASE ) 50 MCG/ACT nasal spray Place 2 sprays into both nostrils daily. prn   ipratropium (ATROVENT ) 0.03 % nasal spray Place 2 sprays into both nostrils every 12 (twelve) hours.   Multiple Vitamin (MULTIVITAMIN) tablet Take 1 tablet by mouth daily.   Multiple Vitamins-Minerals  (PRESERVISION AREDS PO) Take by mouth.   Polyvinyl Alcohol-Povidone (REFRESH OP) Apply 1 drop to eye as needed.   rosuvastatin  (CRESTOR ) 5 MG tablet TAKE 1 TABLET(5 MG) BY MOUTH DAILY   saccharomyces boulardii (FLORASTOR) 250 MG capsule Take 1 capsule (250 mg total) by mouth daily.   senna (SENOKOT) 8.6 MG TABS tablet Take 1 tablet by mouth daily as needed for mild constipation.   [DISCONTINUED] Calcium  Carbonate-Vitamin D (CALCIUM -VITAMIN D3 PO) Take by mouth.   No facility-administered encounter medications on file as of 11/11/2023.     Lab Results  Component Value Date   WBC 5.8 08/12/2023   HGB 13.8 08/12/2023   HCT 42.2 08/12/2023   PLT 318.0 08/12/2023   GLUCOSE 97 08/11/2023   CHOL 168 08/11/2023   TRIG 92.0 08/11/2023   HDL 45.40 08/11/2023   LDLDIRECT 110.0 02/11/2017   LDLCALC 104 (H) 08/11/2023   ALT 11 08/11/2023   AST 17 08/11/2023   NA 140 08/11/2023   K 4.1 08/11/2023   CL 105 08/11/2023   CREATININE 0.62 08/11/2023   BUN 11 08/11/2023   CO2 26 08/11/2023   TSH 3.45 08/12/2023   HGBA1C 5.4 07/15/2017    MM 3D DIAGNOSTIC MAMMOGRAM BILATERAL BREAST Result Date: 04/16/2023 CLINICAL DATA:  Left subareolar breast pain and right axillary pain. EXAM: DIGITAL DIAGNOSTIC BILATERAL MAMMOGRAM WITH TOMOSYNTHESIS AND CAD; ULTRASOUND LEFT BREAST LIMITED; ULTRASOUND RIGHT BREAST LIMITED TECHNIQUE: Bilateral digital diagnostic mammography and breast tomosynthesis was performed. The images were evaluated with computer-aided detection. ; Targeted ultrasound examination of the left breast was performed.; Targeted ultrasound examination of the right breast was performed COMPARISON:  Previous exam(s). ACR Breast Density Category b: There  are scattered areas of fibroglandular density. FINDINGS: Mammographically, there are no suspicious masses, areas of architectural distortion or microcalcifications in either breast. Targeted bilateral breast ultrasound is performed and demonstrates no  suspicious masses or shadowing lesions at the areas of pain in the right axilla, lower right breast, left breast 3 o'clock, 8 o'clock, 9 o'clock and retroareolar. IMPRESSION: No mammographic or sonographic evidence of breast malignancy. RECOMMENDATION: Further management of patient's areas of breast pain should be based on clinical grounds. Screening mammogram in one year.(Code:SM-B-01Y) I have discussed the findings and recommendations with the patient. If applicable, a reminder letter will be sent to the patient regarding the next appointment. BI-RADS CATEGORY  1: Negative. Electronically Signed   By: Dobrinka  Dimitrova M.D.   On: 04/16/2023 11:54   US  LIMITED ULTRASOUND INCLUDING AXILLA LEFT BREAST  Result Date: 04/16/2023 CLINICAL DATA:  Left subareolar breast pain and right axillary pain. EXAM: DIGITAL DIAGNOSTIC BILATERAL MAMMOGRAM WITH TOMOSYNTHESIS AND CAD; ULTRASOUND LEFT BREAST LIMITED; ULTRASOUND RIGHT BREAST LIMITED TECHNIQUE: Bilateral digital diagnostic mammography and breast tomosynthesis was performed. The images were evaluated with computer-aided detection. ; Targeted ultrasound examination of the left breast was performed.; Targeted ultrasound examination of the right breast was performed COMPARISON:  Previous exam(s). ACR Breast Density Category b: There are scattered areas of fibroglandular density. FINDINGS: Mammographically, there are no suspicious masses, areas of architectural distortion or microcalcifications in either breast. Targeted bilateral breast ultrasound is performed and demonstrates no suspicious masses or shadowing lesions at the areas of pain in the right axilla, lower right breast, left breast 3 o'clock, 8 o'clock, 9 o'clock and retroareolar. IMPRESSION: No mammographic or sonographic evidence of breast malignancy. RECOMMENDATION: Further management of patient's areas of breast pain should be based on clinical grounds. Screening mammogram in one year.(Code:SM-B-01Y) I have  discussed the findings and recommendations with the patient. If applicable, a reminder letter will be sent to the patient regarding the next appointment. BI-RADS CATEGORY  1: Negative. Electronically Signed   By: Dobrinka  Dimitrova M.D.   On: 04/16/2023 11:54   US  LIMITED ULTRASOUND INCLUDING AXILLA RIGHT BREAST Result Date: 04/16/2023 CLINICAL DATA:  Left subareolar breast pain and right axillary pain. EXAM: DIGITAL DIAGNOSTIC BILATERAL MAMMOGRAM WITH TOMOSYNTHESIS AND CAD; ULTRASOUND LEFT BREAST LIMITED; ULTRASOUND RIGHT BREAST LIMITED TECHNIQUE: Bilateral digital diagnostic mammography and breast tomosynthesis was performed. The images were evaluated with computer-aided detection. ; Targeted ultrasound examination of the left breast was performed.; Targeted ultrasound examination of the right breast was performed COMPARISON:  Previous exam(s). ACR Breast Density Category b: There are scattered areas of fibroglandular density. FINDINGS: Mammographically, there are no suspicious masses, areas of architectural distortion or microcalcifications in either breast. Targeted bilateral breast ultrasound is performed and demonstrates no suspicious masses or shadowing lesions at the areas of pain in the right axilla, lower right breast, left breast 3 o'clock, 8 o'clock, 9 o'clock and retroareolar. IMPRESSION: No mammographic or sonographic evidence of breast malignancy. RECOMMENDATION: Further management of patient's areas of breast pain should be based on clinical grounds. Screening mammogram in one year.(Code:SM-B-01Y) I have discussed the findings and recommendations with the patient. If applicable, a reminder letter will be sent to the patient regarding the next appointment. BI-RADS CATEGORY  1: Negative. Electronically Signed   By: Dobrinka  Dimitrova M.D.   On: 04/16/2023 11:54       Assessment & Plan:  Mild intermittent asthma without complication Assessment & Plan: Breathing stable. Lungs clear.     Gastroesophageal reflux disease, unspecified whether esophagitis  present Assessment & Plan: No increased upper symptoms reported. Continue nexium .    History of colonic polyps Assessment & Plan: Colonoscopy 09/2022 - unremarkable.    Hypercholesterolemia Assessment & Plan: Continue crestor . Low cholesterol diet and exercise. Follow lipid panel and liver function tests.    Midline low back pain with right-sided sciatica, unspecified chronicity Assessment & Plan: Increased low back pain. Has been to PT. Affecting her walking. Discussed ortho evaluation. Agreeable. Going to [facility] today.    Pelvic congestive syndrome Assessment & Plan: colonoscopy and EGD unremarkable. No stenosis - SMA. Work up - vascular congestion. CTA did not show evidence of potential median arcuate ligament compression that was demonstrated potentially on ultrasound. Referred to vascular for second opinion. Saw Dr Vonna Guardian. Planned procedure - pelvic venous embolization with likely renal angioplasty. Discussed f/u with vascular - again with her today. She declines.    Knee pain, unspecified chronicity, unspecified laterality Assessment & Plan: Knee issues as outlined. No instability noted on exam. Given persistent, will have ortho evaluate. Has just completed PT for her back.    Ear pain, left Assessment & Plan: No evidence of infection. Appears to be more related to TMJ. Discussed mouth guard/bite block. Follow.       Dellar Fenton, MD

## 2023-11-16 ENCOUNTER — Encounter: Payer: Self-pay | Admitting: Internal Medicine

## 2023-11-16 DIAGNOSIS — M25569 Pain in unspecified knee: Secondary | ICD-10-CM | POA: Insufficient documentation

## 2023-11-16 NOTE — Assessment & Plan Note (Signed)
 No increased upper symptoms reported. Continue nexium .

## 2023-11-16 NOTE — Assessment & Plan Note (Signed)
Colonoscopy 09/2022 - unremarkable.

## 2023-11-16 NOTE — Assessment & Plan Note (Signed)
 Breathing stable. Lungs clear.

## 2023-11-16 NOTE — Assessment & Plan Note (Signed)
 Continue crestor.  Low cholesterol diet and exercise.  Follow lipid panel and liver function tests.

## 2023-11-16 NOTE — Assessment & Plan Note (Signed)
 Increased low back pain. Has been to PT. Affecting her walking. Discussed ortho evaluation. Agreeable. Going to [facility] today.

## 2023-11-16 NOTE — Assessment & Plan Note (Signed)
 colonoscopy and EGD unremarkable. No stenosis - SMA. Work up - vascular congestion. CTA did not show evidence of potential median arcuate ligament compression that was demonstrated potentially on ultrasound. Referred to vascular for second opinion. Saw Dr Vonna Guardian. Planned procedure - pelvic venous embolization with likely renal angioplasty. Discussed f/u with vascular - again with her today. She declines.

## 2023-11-16 NOTE — Assessment & Plan Note (Signed)
 Knee issues as outlined. No instability noted on exam. Given persistent, will have ortho evaluate. Has just completed PT for her back.

## 2023-11-17 ENCOUNTER — Telehealth: Payer: Self-pay

## 2023-11-17 ENCOUNTER — Ambulatory Visit: Admitting: Physical Therapy

## 2023-11-17 DIAGNOSIS — H9202 Otalgia, left ear: Secondary | ICD-10-CM | POA: Insufficient documentation

## 2023-11-17 NOTE — Assessment & Plan Note (Signed)
 No evidence of infection. Appears to be more related to TMJ. Discussed mouth guard/bite block. Follow.

## 2023-11-17 NOTE — Telephone Encounter (Signed)
 Symptoms in the office appears to be possibly related to TMJ. If persistent increased pain and symptoms, can reevaluate. Her main focus at her appt was her back/knee.  We did discuss possible TMJ. Discussed mouthguard.

## 2023-11-17 NOTE — Telephone Encounter (Signed)
 Spoke with pt she stated that when she saw [facility] they prescribed her a prednisone  taper. Since she has started the prednisone  her blood sugars have been going up. She stated some of the readings have been 189, 207, 206. This mornings fasting sugar was 143 and this evening was 106 because she had not eaten. I advised pt that prednisone  will cause your blood sugars to be slightly elevated but that she should start seeing them return to normal once she has been off the prednisone  a couple of days. Pt took her last prednisone  today. I also asked about the ear pain and headaches to see if she wanted to be reevaluated and she stated that she didn't know what she should do and would like to know what Dr. Geralyn Knee thought she should do. She said it feels like she has pressure in her ears and head.

## 2023-11-17 NOTE — Telephone Encounter (Signed)
 Copied from CRM [id number]. Topic: Clinical - Medical Advice >> Nov 17, 2023  4:18 PM [place] E wrote: Reason for CRM: Patient states she has had fluctuation in her blood sugar readings and wanting some advice on her diet. She was instructed to be on a low-carb, no sugar diet and patient questioning if this is absolutely necessary. Patient has had some pain in her ears that has happened before along with some headaches, but denied nurse triage for these symptoms. Callback number for patient is [phone].

## 2023-11-18 NOTE — Therapy (Signed)
 OUTPATIENT PHYSICAL THERAPY LOWER EXTREMITY TREATMENT  Patient Name: Lynn Mann MRN: [id number] DOB:04-12-1951, 73 y.o., female Today's Date: 11/18/2023  END OF SESSION:     Past Medical History:  Diagnosis Date   Arthritis    C. difficile diarrhea    age 56s-40s    Chicken pox    Cholecystitis 11/2011   Did not require sgy - Dr. Karena Ota - [facility]  (cholelithiasis)   H/O Clostridium difficile infection    IBS (irritable bowel syndrome)    MRSA exposure 2005   Spider bite   MVP (mitral valve prolapse)    Stable - Dr. Cara Chancellor   Rheumatic fever    Past Surgical History:  Procedure Laterality Date   CATARACT EXTRACTION  [id number]   MUSCLE BIOPSY     Patient Active Problem List   Diagnosis Date Noted   Ear pain, left 11/17/2023   Knee pain 11/16/2023   Pelvic congestive syndrome 01/14/2023   Estrogen deficiency 06/23/2022   Nocturia 03/12/2022   Sleep apnea 03/12/2022   Muscle weakness 03/11/2022   Joint ache 02/11/2022   Ankle pain 02/05/2022   Abdominal pain 12/26/2021   Right shoulder pain 07/15/2021   Hematoma 02/11/2021   Ankle swelling 11/26/2020   Low back pain 11/22/2020   Hip pain, right 11/22/2020   Light headedness 06/24/2020   Hearing loss 06/24/2020   Binocular vision disorder with diplopia 06/24/2020   Stress 05/17/2020   Chest pain 05/16/2020   Welcome to Medicare preventive visit 06/20/2019   Viral syndrome 01/30/2019   Itching 07/21/2017   Asthma 07/16/2016   Urinary incontinence 07/16/2016   SOB (shortness of breath) 04/01/2016   Bilateral shoulder pain 02/24/2016   Fatigue 01/28/2016   Neck fullness 01/28/2016   Routine general medical examination at a health care facility 07/25/2015   Health care maintenance 10/09/2014   Neck pain 11/26/2013   Hypercholesterolemia 11/26/2013   History of colonic polyps 05/11/2013   GERD (gastroesophageal reflux disease) 12/03/2012   Cholelithiasis 11/07/2012   IBS (irritable bowel syndrome)  11/07/2012   History of rheumatic fever 08/28/2012   MVP (mitral valve prolapse) 08/28/2012    PCP: Dellar Fenton, MD  REFERRING PROVIDER: Dellar Fenton, MD  REFERRING DIAG:    R53.83 (ICD-10-CM) - Other fatigue  M62.81 (ICD-10-CM) - Muscle weakness    THERAPY DIAG:  Muscle weakness (generalized)  Gait difficulty  Other abnormalities of gait and mobility  Unsteadiness on feet  Other low back pain  Rationale for Evaluation and Treatment: Rehabilitation  ONSET DATE: chronic (>1 year)  SUBJECTIVE:   SUBJECTIVE STATEMENT: Got Covid 5 days before Christmas in 2023 and reports several complications (Gastritis/ Esophagitis/ 1 hiatal hernia and 1 umbilical hernia/ GERD).  Pt. Reports she is tired and out of shape.  Pt. Reports she has no endurance and requires seated rest breaks.  Pt. States she has pelvic congestion and feels weak in core.    PERTINENT HISTORY: Enjoys making vision boards.   Pt. Is busy with maintaining household chores.  Goes to meeting at [employer] center and [employer].  Pt. Belongs to the [facility].    PAIN:  Are you having pain? No  PRECAUTIONS: None  RED FLAGS: None   WEIGHT BEARING RESTRICTIONS: No  FALLS:  Has patient fallen in last 6 months? No  LIVING ENVIRONMENT: Lives with: lives alone Lives in: House/apartment Stairs: Yes: External: 1 steps; none Has following equipment at home: None  OCCUPATION: Retired  PLOF: Independent  PATIENT GOALS: Increase LE and  core muscle i and improve overall energy.    NEXT MD VISIT: PRN (blood work in April).    OBJECTIVE:  Note: Objective measures were completed at Evaluation unless otherwise noted.  DIAGNOSTIC FINDINGS: N/A  PATIENT SURVEYS:  LEFS TBD next tx. session  COGNITION: Overall cognitive status: Within functional limits for tasks assessed    SENSATION: WFL  EDEMA:  NT  MUSCLE LENGTH: Hamstrings: Right 80 deg; Left 80 deg Thomas test: NT  POSTURE: rounded shoulders and  forward head  PALPATION: NT  LOWER EXTREMITY ROM:  Active ROM Right eval Left eval  Hip flexion [facility]  Hip extension    Hip abduction    Hip adduction    Hip internal rotation    Hip external rotation    Knee flexion [facility]  Knee extension [facility]  Ankle dorsiflexion [facility] WFL  Ankle plantarflexion [facility] WFL  Ankle inversion    Ankle eversion     (Blank rows = not tested)  LOWER EXTREMITY MMT:  MMT Right eval Left eval  Hip flexion 4- 4-  Hip extension    Hip abduction 4 4  Hip adduction    Hip internal rotation    Hip external rotation    Knee flexion 4+ 4+  Knee extension 5 5  Ankle dorsiflexion 4 4  Ankle plantarflexion 4 4  Ankle inversion    Ankle eversion     (Blank rows = not tested)  L anterior thigh discomfort with bridging.    LOWER EXTREMITY SPECIAL TESTS:  NT  FUNCTIONAL TESTS:  5 times sit to stand:    Completed 3.5x with no UE assist.  19.22 seconds.    GAIT: Distance walked: in clinic Assistive device utilized: None Level of assistance: Complete Independence Comments: Forward posture with recip. Gait pattern.  Limited arm swing noted.  Consistent step length/ BOS noted.   Prolonged standing will increase nausea (>10 minutes)- no known cause.               10/13/23: LEFS: 34 out of 80  10/22/23:  5xSTS: 26.65 sec.  (Challenged with last 2 reps).  No UE support required.                                                                                                                     TREATMENT DATE: 11/18/2023 ***  Subjective:  Pt. Has not attended [employer] at this time and states she lost her car keys for a week.  Pt. Found her car keys this morning in house.  Pt. Reports difficulty with grasping/ opening jars due to weakness (more recent issue).  Pt. Reports 2/10 R knee pain.  No pain in R hip at this time but reports difficulty bending down.  Pt. States she hasn't done any exercise over the past week.    LEFS: 19 out of 80 (marked  regression in outcome measure score since last reassessment secondary to pain/ gait concerns).  Pt. Reports R hip "feels like it is  sticking up" and higher on R.  (-) for LLD in supine position.  Grip strength: R 38.6#, L 28#.  Slight hand tremors noted.    There.ex.:  Nustep (seat #11 at L0)- B UE/LE for 10 min.  MH to low back ("feels good").  Discussed R knee pain/ limitations with household tasks.  O2 sat.: 91%, HR: 93 bpm.    Standing hip ex.: marching in //-bars/ hip abduction/ extension (feet slightly ER but improved since last tx.) 20x.   Supine SLR: 5x on L/R.  Challenged/ limited to <30 deg. On L/R.    Discussed supine based/ core ex.  Supine TrA ex.: marching/ bridging with proper technique.  Good technique with ex. and added to HEP.  There.act.:  Walking in //-bars/ hallway with consistent recip. Step pattern/ upright posture/ arm swing.  Pt. Reports several LOB with changes in BOS/ R knee and lower leg control.    STS from gray chair:  knee pain with eccentric quad control during sitting/ requires 1 UE assist for safety.  Challenged with STS with no UE assist.   Walking in clinic/ outside with use of SPC and 2-point gait pattern.  PT encourages pt. To use SPC at home on L side to counter balance R LE during gait.       PATIENT EDUCATION:  Education details: Air traffic controller Person educated: Patient Education method: Medical illustrator Education comprehension: verbalized understanding and returned demonstration  HOME EXERCISE PROGRAM: Access Code: [facility] URL: [email or web address] Date: 10/08/2023 Prepared by: Hazeline Lister  Exercises - Mini Squat with Counter Support  - 1 x daily - 7 x weekly - 2 sets - 10 reps - Standing Hip Extension with Unilateral Counter Support  - 1 x daily - 7 x weekly - 2 sets - 10 reps - Standing Hip Abduction with Unilateral Counter Support  - 1 x daily - 7 x weekly - 2 sets - 10 reps - Standing March  with Unilateral Counter Support  - 1 x daily - 7 x weekly - 2 sets - 10 reps - Standing Knee Flexion with Unilateral Counter Support  - 1 x daily - 7 x weekly - 2 sets - 10 reps  ASSESSMENT:  CLINICAL IMPRESSION: ***  PT tx. Session focused on LE strengthening/ balance ex/ reassessment of gait with SPC/ STS.  Pt. Has lots of complaints about R hip/knee/ gait and balance.  Limited gait/ endurance with walking distance (unable to walk to get mailbox at home).  Pt. Walking with R antalgic gait with occasional shuffling gait/ changes in step length/ limited heel strike.  PT encourages pt. To use SPC with a 2-point gait pattern to decrease R LE pain/ improve balance.  Pt. Presents with limited overall endurance with daily tasks/ standing and motivated to develop a strengthening ex. Program.  PT encourages pt. To start gym based there.ex. and aquatic ex (walking/ classes).  Pt. will benefit from skilled PT services to increase B LE muscle strength to improve overall endurance with daily household/ community tasks.     OBJECTIVE IMPAIRMENTS: decreased activity tolerance, decreased balance, decreased endurance, decreased mobility, difficulty walking, decreased ROM, decreased strength, dizziness, and postural dysfunction.   ACTIVITY LIMITATIONS: carrying, lifting, bending, standing, squatting, and locomotion level  PARTICIPATION LIMITATIONS: meal prep, cleaning, laundry, and community activity  PERSONAL FACTORS: Fitness and Past/current experiences are also affecting patient's functional outcome.   REHAB POTENTIAL: Good  CLINICAL DECISION MAKING: Evolving/moderate complexity  EVALUATION COMPLEXITY: High   GOALS: Goals reviewed  with patient? Yes  SHORT TERM GOALS: Target date: 10/27/23 Pt. Independent with HEP to increase B LE muscle strength 1/2 muscle grade to improve mobility.    Baseline:  see above Goal status: Not met  2.  Pt. Will complete 45 minutes of exercise/ PT with on rest breaks to  improve overall endurance with daily/ community tasks.  Baseline:  limited endurance Goal status: Not met  LONG TERM GOALS: Target date: 11/17/23  Pt. Will increase LEFS 15 points to improve pain-free mobility with daily tasks.   Baseline: 3/17: 34 out of 80.  4/14: 19 out of 80 (regression secondary to pain) Goal status: Not met  2.  Pt. Will complete 5xSTS in <20 sec. With no UE assist on armrest to improve independence with transfers.   Baseline: 3.5x with no UE assist.  19.22 seconds.  3/26: 27 sec.   Goal status: Not met  3.  Pt. Will progress to independent gym [facility]) based ex. Program to maintain LE strength gains to improve daily mobility/ endurance.  Baseline: pt. Member at [facility] but not attending.  Goal status: Not met   PLAN:  PT FREQUENCY: 2x/week  PT DURATION: 6 weeks  PLANNED INTERVENTIONS: 97110-Therapeutic exercises, 97530- Therapeutic activity, [id number]- Neuromuscular re-education, 97535- Self Care, 13244- Manual therapy, [phone]- Gait training, Patient/Family education, Balance training, Stair training, Cryotherapy, and Moist heat  PLAN FOR NEXT SESSION: Progress HEP/ walking endurance.  Discussed decreasing PT frequency to 1x/week with focus on going to [facility]   Janine Melbourne, PT, DPT Physical Therapist - [facility]  [facility]  11/18/2023, 3:44 PM

## 2023-11-18 NOTE — Telephone Encounter (Signed)
 Called Patient she is still having pressure in her head and ears even though it is not as bad. Patient would like to be re evaluated for this so I scheduled her with Dr. Narendra on 11/24/23 at 1:00. Patient states she may see if she can get in at [facility] for this. I let the Patient know if she gets an appointment with [facility] to please call back and cancel the appointment with Dr. Sharia Daunt. Patient is agreeable to the plan.

## 2023-11-19 ENCOUNTER — Ambulatory Visit

## 2023-11-19 ENCOUNTER — Encounter: Payer: Self-pay | Admitting: Physical Therapy

## 2023-11-19 DIAGNOSIS — M6281 Muscle weakness (generalized): Secondary | ICD-10-CM | POA: Diagnosis not present

## 2023-11-19 DIAGNOSIS — R2681 Unsteadiness on feet: Secondary | ICD-10-CM

## 2023-11-19 DIAGNOSIS — R269 Unspecified abnormalities of gait and mobility: Secondary | ICD-10-CM

## 2023-11-19 DIAGNOSIS — M5459 Other low back pain: Secondary | ICD-10-CM

## 2023-11-19 DIAGNOSIS — R2689 Other abnormalities of gait and mobility: Secondary | ICD-10-CM

## 2023-11-21 ENCOUNTER — Telehealth: Payer: Self-pay

## 2023-11-21 NOTE — Telephone Encounter (Signed)
 Copied from CRM [id number]. Topic: Clinical - Medication Question >> Nov 21, 2023 12:29 PM Lynn Mann wrote: Reason for CRM: PAIN AND CRAMPING IN ANKLES WHILE DRIVING AND WANTS TO KNOW IF THERE IS SOMETHING SHE CAN TAKE FOR THIS AND WOULD LIKE FOR DR Geralyn Knee OR HER NURSE TO CONTACT HER.

## 2023-11-21 NOTE — Telephone Encounter (Signed)
 Called patient. She reports having cramping in her feet and ankles when she is driving. She is staying hydrated. She was wanting to know if she could take magnesium  or potassium. Advised that she needs to be evaluated prior to starting supplements. Patient has an appt next week. She will go to urgent care to be evaluated if symptoms worsen over the weekend./

## 2023-11-24 ENCOUNTER — Ambulatory Visit: Admitting: Internal Medicine

## 2023-11-24 ENCOUNTER — Encounter: Payer: Self-pay | Admitting: Physical Therapy

## 2023-11-24 ENCOUNTER — Ambulatory Visit: Payer: PPO | Admitting: Urology

## 2023-11-24 ENCOUNTER — Ambulatory Visit (INDEPENDENT_AMBULATORY_CARE_PROVIDER_SITE_OTHER): Admitting: Family

## 2023-11-24 ENCOUNTER — Encounter: Payer: Self-pay | Admitting: Family

## 2023-11-24 ENCOUNTER — Ambulatory Visit: Admitting: Physical Therapy

## 2023-11-24 VITALS — BP 116/78 | HR 90 | Temp 98.2°F | Ht 67.0 in | Wt 184.2 lb

## 2023-11-24 DIAGNOSIS — M25571 Pain in right ankle and joints of right foot: Secondary | ICD-10-CM

## 2023-11-24 DIAGNOSIS — M6281 Muscle weakness (generalized): Secondary | ICD-10-CM | POA: Diagnosis not present

## 2023-11-24 DIAGNOSIS — R2681 Unsteadiness on feet: Secondary | ICD-10-CM

## 2023-11-24 DIAGNOSIS — R5382 Chronic fatigue, unspecified: Secondary | ICD-10-CM

## 2023-11-24 DIAGNOSIS — R2689 Other abnormalities of gait and mobility: Secondary | ICD-10-CM

## 2023-11-24 DIAGNOSIS — M25572 Pain in left ankle and joints of left foot: Secondary | ICD-10-CM | POA: Diagnosis not present

## 2023-11-24 DIAGNOSIS — R278 Other lack of coordination: Secondary | ICD-10-CM

## 2023-11-24 DIAGNOSIS — M5459 Other low back pain: Secondary | ICD-10-CM

## 2023-11-24 DIAGNOSIS — R269 Unspecified abnormalities of gait and mobility: Secondary | ICD-10-CM

## 2023-11-24 LAB — COMPREHENSIVE METABOLIC PANEL WITH GFR
ALT: 16 U/L (ref 0–35)
AST: 16 U/L (ref 0–37)
Albumin: 4.3 g/dL (ref 3.5–5.2)
Alkaline Phosphatase: 49 U/L (ref 39–117)
BUN: 10 mg/dL (ref 6–23)
CO2: 25 meq/L (ref 19–32)
Calcium: 9.3 mg/dL (ref 8.4–10.5)
Chloride: 106 meq/L (ref 96–112)
Creatinine, Ser: 0.65 mg/dL (ref 0.40–1.20)
GFR: 88.72 mL/min (ref >60.00)
Glucose, Bld: 122 mg/dL — ABNORMAL HIGH (ref 70–99)
Potassium: 4.4 meq/L (ref 3.5–5.1)
Sodium: 140 meq/L (ref 135–145)
Total Bilirubin: 0.9 mg/dL (ref 0.2–1.2)
Total Protein: 6.7 g/dL (ref 6.0–8.3)

## 2023-11-24 LAB — HEMOGLOBIN A1C: Hgb A1c MFr Bld: 6 % (ref 4.6–6.5)

## 2023-11-24 LAB — URIC ACID: Uric Acid, Serum: 6.5 mg/dL (ref 2.4–7.0)

## 2023-11-24 NOTE — Patient Instructions (Signed)
 Referral to Dr. Althea Atkinson  Let us  know if you dont hear back within a week in regards to an appointment being scheduled.   So that you are aware, if you are [facility] MyChart user , please pay attention to your MyChart messages as you may receive a MyChart message with a phone number to call and schedule this test/appointment own your own from our referral coordinator. This is a new process so I do not want you to miss this message.  If you are not a MyChart user, you will receive a phone call.    Compression stockings during the day  Very nice seeing you again

## 2023-11-24 NOTE — Progress Notes (Signed)
 Assessment & Plan:  Bilateral ankle pain, unspecified chronicity Assessment & Plan: Acute on chronic. Some improvement after Medrol Dosepak from [facility].  No alarming features on exam today.  Advised compression stockings during the daytime.  Pending labs to evaluate for electrolyte abnormalities.  Advised follow-up with podiatry, Dr. Althea Atkinson as previously benefited most from steroid injection.  Referral has been replaced to Dr. Althea Atkinson  Orders: -     Uric acid -     Comprehensive metabolic panel with GFR -     Ambulatory referral to Podiatry -     Hemoglobin A1c     Return precautions given.   Risks, benefits, and alternatives of the medications and treatment plan prescribed today were discussed, and patient expressed understanding.   Education regarding symptom management and diagnosis given to patient on AVS either electronically or printed.  No follow-ups on file.  Bascom Bossier, FNP  Subjective:    Patient ID: Lynn Mann, female    DOB: 1951/05/20, 73 y.o.   MRN: [id number]  CC: Lynn Mann is a 73 y.o. female who presents today for an acute visit.    HPI: Complains of BL ankle pain, for years , however worse since starting physical therapy 6 weeks ago. Right ankle pain is worse than left.  She describes 'cramping' in right foot when driving.   No fever, swelling, laceration.  No recent fall.   She had a fall 3 years ago.   Ankle pain somewhat improved after medrol dose pack.   No h/o gout.      She wears compression stockings prn.   [facility]en by [facility] 11/10/23, given medrol dose pack 11/11/23. Seen for BL knee , BL hip pain.  She reports knee xray and hip xray.   H/o MVP, asthma, h/o allergies, cdiff  Never smoker  No h/o CKD   Previously seen by 01/2022 Anell Baptist for BL ankle pain for chronic synovitis. S/p injection  Left ankle Xray 09/2021 Diffuse soft tissue swelling  Right ankle xray 11/2021 Soft tissue  swelling  Allergies: Other, Adhesive [tape], Fd&c yellow #5 (tartrazine), and Yellow dyes (non-tartrazine) Current Outpatient Medications on File Prior to Visit  Medication Sig Dispense Refill   albuterol  (VENTOLIN  HFA) 108 (90 Base) MCG/ACT inhaler Inhale 2 puffs into the lungs every 4 (four) hours as needed for wheezing or shortness of breath. 6.7 g 0   cetirizine  (ZYRTEC ) 10 MG tablet Take 1 tablet (10 mg total) by mouth daily as needed for allergies. 30 tablet 11   Cholecalciferol (VITAMIN D3 PO) Take 1 tablet by mouth daily.     esomeprazole  (NEXIUM ) 40 MG capsule Take 1 capsule (40 mg total) by mouth daily. 90 capsule 3   estradiol (ESTRACE) 0.1 MG/GM vaginal cream Insert fingertip unit vaginally and on urethra nightly x 2 weeks, then every other night x 2 weeks, then 2-3 times weekly for maintenance     fluticasone  (FLONASE ) 50 MCG/ACT nasal spray Place 2 sprays into both nostrils daily. prn 16 g 11   ipratropium (ATROVENT ) 0.03 % nasal spray Place 2 sprays into both nostrils every 12 (twelve) hours. 30 mL 12   Multiple Vitamin (MULTIVITAMIN) tablet Take 1 tablet by mouth daily.     Multiple Vitamins-Minerals (PRESERVISION AREDS PO) Take by mouth.     Polyvinyl Alcohol-Povidone (REFRESH OP) Apply 1 drop to eye as needed.     rosuvastatin  (CRESTOR ) 5 MG tablet TAKE 1 TABLET(5 MG) BY MOUTH DAILY 90 tablet 2   saccharomyces  boulardii (FLORASTOR) 250 MG capsule Take 1 capsule (250 mg total) by mouth daily. 90 capsule 0   senna (SENOKOT) 8.6 MG TABS tablet Take 1 tablet by mouth daily as needed for mild constipation.     No current facility-administered medications on file prior to visit.    Review of Systems  Constitutional:  Negative for chills and fever.  Respiratory:  Negative for cough.   Cardiovascular:  Negative for chest pain, palpitations and leg swelling.  Gastrointestinal:  Negative for nausea and vomiting.  Musculoskeletal:  Positive for arthralgias (BL ankle pain).       Objective:    BP 116/78   Pulse 90   Temp 98.2 F (36.8 C) (Oral)   Ht 5\' 7"  (1.702 m)   Wt 184 lb 3.2 oz (83.6 kg)   SpO2 96%   BMI 28.85 kg/m   BP Readings from Last 3 Encounters:  11/24/23 116/78  11/11/23 128/72  08/12/23 110/74   Wt Readings from Last 3 Encounters:  11/24/23 184 lb 3.2 oz (83.6 kg)  11/11/23 188 lb (85.3 kg)  08/12/23 183 lb (83 kg)    Physical Exam Vitals reviewed.  Constitutional:      Appearance: She is well-developed.  Eyes:     Conjunctiva/sclera: Conjunctivae normal.  Cardiovascular:     Rate and Rhythm: Normal rate and regular rhythm.     Pulses: Normal pulses.     Heart sounds: Normal heart sounds.     Comments: No palpable cords or masses. No erythema or increased warmth. No asymmetry in calf size when compared bilaterally LE hair growth symmetric and present. No discoloration or varicosities noted. LE warm and palpable pedal pulses.  Pulmonary:     Effort: Pulmonary effort is normal.     Breath sounds: Normal breath sounds. No wheezing, rhonchi or rales.  Musculoskeletal:     Right ankle: Swelling present. No deformity. Normal range of motion.     Left ankle: Swelling present. No deformity. Normal range of motion.     Comments: Trace BL non pitting edema over dorsal aspect of foot. No pain with Squeeze test at mid calf bilaterally. No pain over lateral malleolus, medial malleolus, base of the fifth metatarsal or navicular bone. Pain elicited with external rotation stress test. Able to plantar and dorsi flex without pain.  Sensation intact equally bilateral lower extremities. Palpable pedal pulses.   Skin:    General: Skin is warm and dry.  Neurological:     Mental Status: She is alert.  Psychiatric:        Speech: Speech normal.        Behavior: Behavior normal.        Thought Content: Thought content normal.

## 2023-11-24 NOTE — Therapy (Signed)
 OUTPATIENT PHYSICAL THERAPY LOWER EXTREMITY TREATMENT  Patient Name: Lynn Mann MRN: [id number] DOB:02/17/1951, 73 y.o., female Today's Date: 11/24/2023  END OF SESSION:   PT End of Session - 11/24/23 1354     Visit Number 9    Number of Visits 12    Date for PT Re-Evaluation 12/17/23    PT Start Time 1354    PT Stop Time 1436    PT Time Calculation (min) 42 min    Activity Tolerance --    Behavior During Therapy --              Past Medical History:  Diagnosis Date   Arthritis    C. difficile diarrhea    age 57s-40s    Chicken pox    Cholecystitis 11/2011   Did not require sgy - Dr. Karena Ota - [facility]  (cholelithiasis)   H/O Clostridium difficile infection    IBS (irritable bowel syndrome)    MRSA exposure 2005   Spider bite   MVP (mitral valve prolapse)    Stable - Dr. Cara Chancellor   Rheumatic fever    Past Surgical History:  Procedure Laterality Date   CATARACT EXTRACTION  [id number]   MUSCLE BIOPSY     Patient Active Problem List   Diagnosis Date Noted   Ear pain, left 11/17/2023   Knee pain 11/16/2023   Pelvic congestive syndrome 01/14/2023   Estrogen deficiency 06/23/2022   Nocturia 03/12/2022   Sleep apnea 03/12/2022   Muscle weakness 03/11/2022   Joint ache 02/11/2022   Bilateral ankle pain 02/05/2022   Abdominal pain 12/26/2021   Right shoulder pain 07/15/2021   Hematoma 02/11/2021   Ankle swelling 11/26/2020   Low back pain 11/22/2020   Hip pain, right 11/22/2020   Light headedness 06/24/2020   Hearing loss 06/24/2020   Binocular vision disorder with diplopia 06/24/2020   Stress 05/17/2020   Chest pain 05/16/2020   Welcome to Medicare preventive visit 06/20/2019   Viral syndrome 01/30/2019   Itching 07/21/2017   Asthma 07/16/2016   Urinary incontinence 07/16/2016   SOB (shortness of breath) 04/01/2016   Bilateral shoulder pain 02/24/2016   Fatigue 01/28/2016   Neck fullness 01/28/2016   Routine general medical examination at a health  care facility 07/25/2015   Health care maintenance 10/09/2014   Neck pain 11/26/2013   Hypercholesterolemia 11/26/2013   History of colonic polyps 05/11/2013   GERD (gastroesophageal reflux disease) 12/03/2012   Cholelithiasis 11/07/2012   IBS (irritable bowel syndrome) 11/07/2012   History of rheumatic fever 08/28/2012   MVP (mitral valve prolapse) 08/28/2012    PCP: Dellar Fenton, MD  REFERRING PROVIDER: Dellar Fenton, MD  REFERRING DIAG:    R53.83 (ICD-10-CM) - Other fatigue  M62.81 (ICD-10-CM) - Muscle weakness    THERAPY DIAG:  Muscle weakness (generalized)  Gait difficulty  Other abnormalities of gait and mobility  Unsteadiness on feet  Other low back pain  Chronic fatigue  Other lack of coordination  Rationale for Evaluation and Treatment: Rehabilitation  ONSET DATE: chronic (>1 year)  SUBJECTIVE:   SUBJECTIVE STATEMENT: Got Covid 5 days before Christmas in 2023 and reports several complications (Gastritis/ Esophagitis/ 1 hiatal hernia and 1 umbilical hernia/ GERD).  Pt. Reports she is tired and out of shape.  Pt. Reports she has no endurance and requires seated rest breaks.  Pt. States she has pelvic congestion and feels weak in core.    PERTINENT HISTORY: Enjoys making vision boards.   Pt. Is busy with maintaining  household chores.  Goes to meeting at [employer] center and [employer].  Pt. Belongs to the [facility].    PAIN:  Are you having pain? No  PRECAUTIONS: None  RED FLAGS: None   WEIGHT BEARING RESTRICTIONS: No  FALLS:  Has patient fallen in last 6 months? No  LIVING ENVIRONMENT: Lives with: lives alone Lives in: House/apartment Stairs: Yes: External: 1 steps; none Has following equipment at home: None  OCCUPATION: Retired  PLOF: Independent  PATIENT GOALS: Increase LE and core muscle i and improve overall energy.    NEXT MD VISIT: PRN (blood work in April).    OBJECTIVE:  Note: Objective measures were completed at Evaluation unless  otherwise noted.  DIAGNOSTIC FINDINGS: N/A  PATIENT SURVEYS:  LEFS TBD next tx. session  COGNITION: Overall cognitive status: Within functional limits for tasks assessed    SENSATION: WFL  EDEMA:  NT  MUSCLE LENGTH: Hamstrings: Right 80 deg; Left 80 deg Thomas test: NT  POSTURE: rounded shoulders and forward head  PALPATION: NT  LOWER EXTREMITY ROM:  Active ROM Right eval Left eval  Hip flexion [facility]  Hip extension    Hip abduction    Hip adduction    Hip internal rotation    Hip external rotation    Knee flexion [facility]  Knee extension [facility]  Ankle dorsiflexion [facility] WFL  Ankle plantarflexion [facility] WFL  Ankle inversion    Ankle eversion     (Blank rows = not tested)  LOWER EXTREMITY MMT:  MMT Right eval Left eval  Hip flexion 4- 4-  Hip extension    Hip abduction 4 4  Hip adduction    Hip internal rotation    Hip external rotation    Knee flexion 4+ 4+  Knee extension 5 5  Ankle dorsiflexion 4 4  Ankle plantarflexion 4 4  Ankle inversion    Ankle eversion     (Blank rows = not tested)  L anterior thigh discomfort with bridging.    LOWER EXTREMITY SPECIAL TESTS:  NT  FUNCTIONAL TESTS:  5 times sit to stand:    Completed 3.5x with no UE assist.  19.22 seconds.    GAIT: Distance walked: in clinic Assistive device utilized: None Level of assistance: Complete Independence Comments: Forward posture with recip. Gait pattern.  Limited arm swing noted.  Consistent step length/ BOS noted.   Prolonged standing will increase nausea (>10 minutes)- no known cause.               10/13/23: LEFS: 34 out of 80  10/22/23:  5xSTS: 26.65 sec.  (Challenged with last 2 reps).  No UE support required.   11/19/23: 5xSTS: 36.26 second                                                                                                                    TREATMENT DATE: 11/24/2023   Subjective:  Pt. States she feels everything is moving in with knees/hips.  Pt.  States knees are rubbing  on each other with B foot inversion during walking.  PT reviewed knee/hip x-rays (arthritis).  Pt. Has been referred to Dr. Althea Atkinson for cramping in B feet (R worse than L)- happened last week while driving.  Pt. States completion of heel/toe raise exercise may have caused cramping in feet/lower leg.  Pt. States she was unable to drive and had to pull over and sit for a while.  Pt. Brought in R knee brace and ambulates with slight antalgic gait.  PT does not recommend use of knee brace at this time due to no benefit and more antalgic gait pattern noted.     EDUCATION: Lengthy discussion on pain/ LLD/ cramping in feet/ R knee brace and gait in variety of shoes.    There.ex.:  Marching in //-bars (3 laps).  Forward/ backwards walking in //-bars 3 laps.  Alt. UE/LE touches with mirror feedback with cuing to maintain LE in midline/ prevent knee valgus.   Banker.    Sit to stands with light UE assist required for safety/ technique.  Pt. Reports STS causes increase in R hip/back symptoms.    Seated marching 2 x 10   Seated LAQ 2 x 10 each LE   Self-care/ Home mgmt.:  Reviewed shoes/ discussed supportive shoes and assessed gait in gym.  Pt. Prefers flat shoes with no arch support.   Pt. Has sneakers with orthotics but reports increase fall risk/ difficulty wearing.  Pt. Has hole in all 3 pairs of shoes at great toe.    Discussed benefits of aquatic ex./walking to improve strength with less pain/symptoms.  Pt. Has bathing suit and gym membership and will hopefully attend prior to next PT tx. Session.    Pt. Has MD order for RW/rollator and PT reviewed gait with SPC vs. RW.     PATIENT EDUCATION:  Education details: Air traffic controller Person educated: Patient Education method: Medical illustrator Education comprehension: verbalized understanding and returned demonstration  HOME EXERCISE PROGRAM: Access Code: [facility] URL:  [email or web address] Date: 10/08/2023 Prepared by: Hazeline Lister  Exercises - Mini Squat with Counter Support  - 1 x daily - 7 x weekly - 2 sets - 10 reps - Standing Hip Extension with Unilateral Counter Support  - 1 x daily - 7 x weekly - 2 sets - 10 reps - Standing Hip Abduction with Unilateral Counter Support  - 1 x daily - 7 x weekly - 2 sets - 10 reps - Standing March with Unilateral Counter Support  - 1 x daily - 7 x weekly - 2 sets - 10 reps - Standing Knee Flexion with Unilateral Counter Support  - 1 x daily - 7 x weekly - 2 sets - 10 reps  ASSESSMENT:  CLINICAL IMPRESSION:    Patient arrives to treatment session with no AD and images from recent MD visit (11/11/23). Lengthy discussion on leg length discrepancy based on images and known history of scoliosis as well as proper shoes/ increase pain with certain exercises.  Also discussed attendance of [employer] for aquatic exercise to reduce load on joints as this is patient's biggest concern. Session focused on seated LE strengthening and assessment of goals/ gait with various shoes/ L knee brace.  Pt. Has regressed over past several weeks due to increase c/o pain/ limitations with mobility. Pt. will benefit from skilled PT services to increase B LE muscle strength to improve overall endurance with daily household/ community tasks.     OBJECTIVE IMPAIRMENTS: decreased activity tolerance, decreased balance, decreased endurance,  decreased mobility, difficulty walking, decreased ROM, decreased strength, dizziness, and postural dysfunction.   ACTIVITY LIMITATIONS: carrying, lifting, bending, standing, squatting, and locomotion level  PARTICIPATION LIMITATIONS: meal prep, cleaning, laundry, and community activity  PERSONAL FACTORS: Fitness and Past/current experiences are also affecting patient's functional outcome.   REHAB POTENTIAL: Good  CLINICAL DECISION MAKING: Evolving/moderate complexity  EVALUATION COMPLEXITY:  High   GOALS: Goals reviewed with patient? Yes  SHORT TERM GOALS: Target date: 12/03/2023  Pt. Independent with HEP to increase B LE muscle strength 1/2 muscle grade to improve mobility.    Baseline:  see above Goal status: Not met  2.  Pt. Will complete 45 minutes of exercise/ PT with on rest breaks to improve overall endurance with daily/ community tasks.  Baseline:  limited endurance; 4/23: continues with limited endurance Goal status: Not met  LONG TERM GOALS: Target date: 12/17/2023  Pt. Will increase LEFS 15 points to improve pain-free mobility with daily tasks.   Baseline: 3/17: 34 out of 80.  4/14: 19 out of 80 (regression secondary to pain) Goal status: Not met  2.  Pt. Will complete 5xSTS in <20 sec. With no UE assist on armrest to improve independence with transfers.   Baseline: 3.5x with no UE assist.  19.22 seconds.  3/26: 27 sec.; 4/23: 36.26 sec Goal status: Not met  3.  Pt. Will progress to independent gym [facility]) based ex. Program to maintain LE strength gains to improve daily mobility/ endurance.  Baseline: pt. Member at [facility] but not attending. 4/23: not yet started  Goal status: Not met   PLAN:  PT FREQUENCY: 1-2x/week  PT DURATION: 4 weeks  PLANNED INTERVENTIONS: 97110-Therapeutic exercises, 97530- Therapeutic activity, 97112- Neuromuscular re-education, 97535- Self Care, 16109- Manual therapy, [phone]- Gait training, Patient/Family education, Balance training, Stair training, Cryotherapy, and Moist heat  PLAN FOR NEXT SESSION: Progress HEP/ walking endurance.  Discussed decreasing PT frequency to 1x/week with focus on going to [facility]   Lendell Quarry, PT, DPT # [phone] Physical Therapist - [facility]  11/24/2023, 8:20 PM

## 2023-11-26 ENCOUNTER — Encounter: Payer: Self-pay | Admitting: Family

## 2023-11-27 NOTE — Assessment & Plan Note (Addendum)
 Acute on chronic. Some improvement after Medrol Dosepak from [facility].  No alarming features on exam today.  Advised compression stockings during the daytime.  Pending labs to evaluate for electrolyte abnormalities.  Advised follow-up with podiatry, Dr. Althea Atkinson as previously benefited most from steroid injection.  Referral has been replaced to Dr. Althea Atkinson

## 2023-11-29 ENCOUNTER — Emergency Department

## 2023-11-29 ENCOUNTER — Emergency Department
Admission: EM | Admit: 2023-11-29 | Discharge: 2023-11-29 | Disposition: A | Discharge status: Home / Self Care | Attending: Emergency Medicine | Admitting: Emergency Medicine

## 2023-11-29 ENCOUNTER — Other Ambulatory Visit: Payer: Self-pay

## 2023-11-29 DIAGNOSIS — M542 Cervicalgia: Secondary | ICD-10-CM | POA: Insufficient documentation

## 2023-11-29 DIAGNOSIS — S299XXA Unspecified injury of thorax, initial encounter: Secondary | ICD-10-CM | POA: Diagnosis not present

## 2023-11-29 DIAGNOSIS — M5126 Other intervertebral disc displacement, lumbar region: Secondary | ICD-10-CM | POA: Diagnosis not present

## 2023-11-29 DIAGNOSIS — M4316 Spondylolisthesis, lumbar region: Secondary | ICD-10-CM | POA: Diagnosis not present

## 2023-11-29 DIAGNOSIS — M25551 Pain in right hip: Secondary | ICD-10-CM | POA: Diagnosis not present

## 2023-11-29 DIAGNOSIS — W010XXA Fall on same level from slipping, tripping and stumbling without subsequent striking against object, initial encounter: Secondary | ICD-10-CM | POA: Insufficient documentation

## 2023-11-29 DIAGNOSIS — S3992XA Unspecified injury of lower back, initial encounter: Secondary | ICD-10-CM | POA: Diagnosis not present

## 2023-11-29 DIAGNOSIS — M4804 Spinal stenosis, thoracic region: Secondary | ICD-10-CM | POA: Diagnosis not present

## 2023-11-29 DIAGNOSIS — W19XXXA Unspecified fall, initial encounter: Secondary | ICD-10-CM

## 2023-11-29 DIAGNOSIS — R519 Headache, unspecified: Secondary | ICD-10-CM | POA: Insufficient documentation

## 2023-11-29 DIAGNOSIS — M47814 Spondylosis without myelopathy or radiculopathy, thoracic region: Secondary | ICD-10-CM | POA: Diagnosis not present

## 2023-11-29 DIAGNOSIS — S0990XA Unspecified injury of head, initial encounter: Secondary | ICD-10-CM | POA: Diagnosis not present

## 2023-11-29 DIAGNOSIS — I6522 Occlusion and stenosis of left carotid artery: Secondary | ICD-10-CM | POA: Diagnosis not present

## 2023-11-29 DIAGNOSIS — S199XXA Unspecified injury of neck, initial encounter: Secondary | ICD-10-CM | POA: Diagnosis not present

## 2023-11-29 DIAGNOSIS — M48061 Spinal stenosis, lumbar region without neurogenic claudication: Secondary | ICD-10-CM | POA: Diagnosis not present

## 2023-11-29 MED ORDER — LIDOCAINE 5 % EX PTCH: 1.0000 | MEDICATED_PATCH | CUTANEOUS | 0 refills | Status: AC

## 2023-11-29 NOTE — ED Provider Triage Note (Signed)
 Emergency Medicine Provider Triage Evaluation Note  Lynn Mann , a 73 y.o. female  was evaluated in triage.  Pt complains of fall, complains of right hip pain, hit head, neck pain, and back pain.  Review of Systems  Positive:  Negative:   Physical Exam  BP 130/87 (BP Location: Right Arm)   Pulse (!) 115   Temp 99 F (37.2 C) (Oral)   Resp 16   Ht 5\' 7"  (1.702 m)   SpO2 98%   BMI 28.85 kg/m  Gen:   Awake, no distress   Resp:  Normal effort  MSK:   Moves extremities without difficulty, C-spine, T-spine and lumbar spine tender, right hip tender Other:    Medical Decision Making  Medically screening exam initiated at 3:17 PM.  Appropriate orders placed.  ANDI ORDERS was informed that the remainder of the evaluation will be completed by another provider, this initial triage assessment does not replace that evaluation, and the importance of remaining in the ED until their evaluation is complete.     Delsie Figures, PA-C 11/29/23 [phone]

## 2023-11-29 NOTE — ED Triage Notes (Addendum)
 Pt to ed from home via POV for a fall. Pt states "I slipped on some liquid soap in her bathroom floor" last night and landed on her right hip and hit her head on the metal hinge of the shower door. Pt denies LOC, denies dizziness. Pt complaint of pain in her right hip, head and back. Pt is caox4, in no acute distress and ambulatory in triage. Pt denies thinners.

## 2023-11-29 NOTE — ED Provider Notes (Signed)
 [facility] Provider Note    Event Date/Time   First MD Initiated Contact with Patient 11/29/23 1546     (approximate)   History   Fall   HPI  GAYNOR SANTORI is a 73 y.o. female who presents to the emergency department today with pain after a fall.  The fall occurred yesterday evening.  She slipped on some soap that was on the ground.  Having some pain to her right hip back and neck.  The patient was able to get up and walk around after this happened. Has not tried any medication for the pain.   Physical Exam   Triage Vital Signs: ED Triage Vitals [11/29/23 1514]  Encounter Vitals Group     BP 130/87     Systolic BP Percentile      Diastolic BP Percentile      Pulse Rate (!) 115     Resp 16     Temp 99 F (37.2 C)     Temp Source Oral     SpO2 98 %     Weight      Height 5\' 7"  (1.702 m)     Head Circumference      Peak Flow      Pain Score 5     Pain Loc      Pain Education      Exclude from Growth Chart     Most recent vital signs: Vitals:   11/29/23 1614 11/29/23 1615  BP:  127/65  Pulse:  71  Resp:    Temp:    SpO2: 100% 100%   General: Awake, alert, oriented. CV:  Good peripheral perfusion. Regular rate and rhythm. Resp:  Normal effort. Lungs clear. Abd:  No distention. Non tender. Other:  Minimal tenderness to cervical spine.   ED Results / Procedures / Treatments   Labs (all labs ordered are listed, but only abnormal results are displayed) Labs Reviewed - No data to display   EKG  None   RADIOLOGY I independently interpreted and visualized the right hip. My interpretation: No fracture Radiology interpretation:  IMPRESSION:  Negative.   I independently interpreted and visualized the CT head/cervical spine. My interpretation: No ICH. No fracture. Radiology interpretation:  IMPRESSION:  1. No acute intracranial process.  2. Multilevel degenerative changes in the cervical spine without  evidence of acute  fracture.   I independently interpreted and visualized the CT thoracic spine. My interpretation: No fracture Radiology interpretation:  IMPRESSION:  1. No acute fracture or traumatic subluxation of the thoracic spine.  2. Mild degenerative changes of the mid thoracic spine.    I independently interpreted and visualized the CT lumbar spine. My interpretation: No fracture Radiology interpretation: IMPRESSION: 1. No acute fracture or traumatic subluxation. 2. Stable grade 1 anterolisthesis at L4-5. 3. Mild foraminal narrowing bilaterally at L3-4. 4. Moderate facet hypertrophy at L2-3, L3-4, L4-5, and L5-S1.    PROCEDURES:  Critical Care performed: No    MEDICATIONS ORDERED IN ED: Medications - No data to display   IMPRESSION / MDM / ASSESSMENT AND PLAN / ED COURSE  I reviewed the triage vital signs and the nursing notes.                              Differential diagnosis includes, but is not limited to, fracture, sprain, dislocation  Patient's presentation is most consistent with acute presentation with potential threat to life or  bodily function.   Patient presented to the emergency department today because of concerns for right hip pain, spine pain after a fall yesterday.  On exam patient does have some minimal midline cervical spine tenderness.  Imaging ordered from triage of the spine, head and right hip without concerning acute finding.  Will plan on discharging.  Discussed with patient over-the-counter pain medication.  Additionally will give prescription for Lidoderm  patch.      FINAL CLINICAL IMPRESSION(S) / ED DIAGNOSES   Final diagnoses:  Fall, initial encounter     Note:  This document was prepared using Dragon voice recognition software and may include unintentional dictation errors.    Marylynn Soho, MD 11/29/23 [phone]

## 2023-12-01 ENCOUNTER — Ambulatory Visit: Payer: PPO | Admitting: Urology

## 2023-12-01 VITALS — BP 135/75 | HR 101 | Ht 67.0 in | Wt 185.0 lb

## 2023-12-01 DIAGNOSIS — N3946 Mixed incontinence: Secondary | ICD-10-CM | POA: Diagnosis not present

## 2023-12-01 DIAGNOSIS — N3281 Overactive bladder: Secondary | ICD-10-CM | POA: Diagnosis not present

## 2023-12-01 LAB — URINALYSIS, COMPLETE
Bilirubin, UA: NEGATIVE
Glucose, UA: NEGATIVE
Leukocytes,UA: NEGATIVE
Nitrite, UA: NEGATIVE
RBC, UA: NEGATIVE
Specific Gravity, UA: 1.03 (ref 1.005–1.030)
Urobilinogen, Ur: 0.2 mg/dL (ref 0.2–1.0)
pH, UA: 5.5 (ref 5.0–7.5)

## 2023-12-01 LAB — MICROSCOPIC EXAMINATION: Bacteria, UA: NONE SEEN

## 2023-12-01 NOTE — Progress Notes (Signed)
 12/01/2023 8:24 AM   Lynn Mann [birth year] [id number]  Referring provider: Dellar Fenton, MD [address] [place],  [place] [address]  Chief Complaint  Patient presents with   Over Active Bladder    HPI: SN: Mixed incontinence not interested in medication and opted for percutaneous tibial nerve stimulation.  She was given Myrbetriq  and pelvic floor therapy   Patient currently primarily has foot on the floor syndrome.  It can be quite high-volume and she wears a number of pads at night.  She has rare urge incontinence during the day.  She can hold urination for 2 to 5 hours but sometimes she will have key in the door syndrome but otherwise no urge incontinence.  She voids 2-4 times a night and has intermittent ankle edema does not take a diuretic   No stress incontinence or bedwetting   Flow was good and she feels empty   Failed Myrbetriq .  Myrbetriq  before COVID helped some.  She has not had a hysterectomy   On pelvic examination well supported bladder neck and negative cough test.  Grade 1 cystocele     patient clinically has an overactive bladder and likely has an element of a nocturnal diuresis.  When she holds it too long during the day she can have uncommon key in door syndrome.  Reassess for cystoscopy in 6 weeks on Gemtesa  samples and prescription.  Antimuscarinics and third line therapies especially percutaneous tibial nerve stimulation may be very good options to consider based on severity of symptoms.  Botox and InterStim or other options.  I do not think at this stage she needs urodynamics   Patient can be sitting in bed and also get some urgency possibly triggered by standing.  She has gone back to physical therapy but they held it for now because she is having a little bit of lightheadedness.  This should be taken into consideration if we give her antimuscarinics.   For unclear reasons did not take all of or some of the Gemtesa  samples.   Still has urge incontinence.  . On pelvic examination well supported bladder neck and a negative cough test after cystoscopy Cystoscopy: Normal   I thought it was reasonable to try the Gemtesa  again in the future may consider third line therapies and/or antimuscarinics as noted above.  She has nonspecific GI issues and I will call if culture positive.  Cannot remember why she did not take the medicine    Patient had decreased nocturia but was still symptomatic with the Gemtesa .  She started going more frequently during the day.  She still has foot on the floor syndrome and little to no urge incontinence during the day.  She did start estrogen by gynecology.  She was just diagnosed as having mild sleep apnea and is going to start CPAP.  She also had nasal stuffiness from Gemtesa .  She may have a vascular procedure for congestion syndrome that may be affecting her renal vein.  She was seen by vascular surgery     Patient understands pelvic congestion syndrome not related to her bladder dysfunction my opinion. Reassess in 4 months. I went over 3 refractory treatments with full template and handouts. Role of antimuscarinic discussed. With the 2 treatments above I thought it was best not to start another treatment yet especially with the CPAP which could help her nighttime symptoms. Assessed 4 months    Today Patient has not started CPAP or been treated by vascular surgery yet.  She no longer takes the Gemtesa .  Still has some urgency incontinence at night as noted.  Frequency stable.  I answered multiple questions about diet and many things she found online which were not related to her problem.  Role of physical therapy with electrical stimulation discussed again. Referred to PT  Today   Clinically patient not infected today but has nonspecific back pain and wants her urine checked for infection.  I will call if positive.  She fell recently and CT of the head I reviewed.  She has some white matter  hypodensities that she was concerned may be causing her incontinence.  This was discussed.  Incontinence stable.  Frequency stable.  PMH: Past Medical History:  Diagnosis Date   Arthritis    C. difficile diarrhea    age 9s-40s    Chicken pox    Cholecystitis 11/2011   Did not require sgy - Dr. Karena Ota - [facility]  (cholelithiasis)   H/O Clostridium difficile infection    IBS (irritable bowel syndrome)    MRSA exposure 2005   Spider bite   MVP (mitral valve prolapse)    Stable - Dr. Cara Chancellor   Rheumatic fever     Surgical History: Past Surgical History:  Procedure Laterality Date   CATARACT EXTRACTION  [id number]   MUSCLE BIOPSY      Home Medications:  Allergies as of 12/01/2023       Reactions   Other Anaphylaxis   Artificial sweetener   Adhesive [tape]    Fd&c Yellow #5 (tartrazine)    Yellow Dyes (non-tartrazine) Other (See Comments)   Arthritis pains - it is tartrazine Yellow #5        Medication List        Accurate as of Dec 01, 2023  8:24 AM. If you have any questions, ask your nurse or doctor.          albuterol  108 (90 Base) MCG/ACT inhaler Commonly known as: VENTOLIN  HFA Inhale 2 puffs into the lungs every 4 (four) hours as needed for wheezing or shortness of breath.   cetirizine  10 MG tablet Commonly known as: ZYRTEC  Take 1 tablet (10 mg total) by mouth daily as needed for allergies.   esomeprazole  40 MG capsule Commonly known as: NEXIUM  Take 1 capsule (40 mg total) by mouth daily.   estradiol 0.1 MG/GM vaginal cream Commonly known as: ESTRACE Insert fingertip unit vaginally and on urethra nightly x 2 weeks, then every other night x 2 weeks, then 2-3 times weekly for maintenance   fluticasone  50 MCG/ACT nasal spray Commonly known as: FLONASE  Place 2 sprays into both nostrils daily. prn   ipratropium 0.03 % nasal spray Commonly known as: ATROVENT  Place 2 sprays into both nostrils every 12 (twelve) hours.   lidocaine  5 % Commonly known as:  Lidoderm  Place 1 patch onto the skin daily for 10 days. Remove & Discard patch within 12 hours or as directed by MD   multivitamin tablet Take 1 tablet by mouth daily.   PRESERVISION AREDS PO Take by mouth.   REFRESH OP Apply 1 drop to eye as needed.   rosuvastatin  5 MG tablet Commonly known as: CRESTOR  TAKE 1 TABLET(5 MG) BY MOUTH DAILY   saccharomyces boulardii 250 MG capsule Commonly known as: Florastor Take 1 capsule (250 mg total) by mouth daily.   senna 8.6 MG Tabs tablet Commonly known as: SENOKOT Take 1 tablet by mouth daily as needed for mild constipation.   VITAMIN D3 PO Take 1  tablet by mouth daily.        Allergies:  Allergies  Allergen Reactions   Other Anaphylaxis    Artificial sweetener   Adhesive [Tape]    Fd&C Yellow #5 (Tartrazine)    Yellow Dyes (Non-Tartrazine) Other (See Comments)    Arthritis pains - it is tartrazine Yellow #5    Family History: Family History  Problem Relation Age of Onset   Arthritis Mother    Stroke Mother    Diabetes Mother    Hypertension Mother    Arthritis Father    Stroke Father    Diabetes Paternal Grandmother    Cancer Paternal Uncle        colon   Heart disease Other        maternal and paternal side   Breast cancer Paternal Aunt    Breast cancer Cousin    Breast cancer Cousin        female cousin   Breast cancer Cousin     Social History:  reports that she has never smoked. She has never been exposed to tobacco smoke. She has never used smokeless tobacco. She reports that she does not currently use alcohol. She reports that she does not use drugs.  ROS:                                        Physical Exam: There were no vitals taken for this visit.  Constitutional:  Alert and oriented, No acute distress. HEENT: [place] AT, moist mucus membranes.  Trachea midline, no masses.  Laboratory Data: Lab Results  Component Value Date   WBC 5.8 08/12/2023   HGB 13.8 08/12/2023    HCT 42.2 08/12/2023   MCV 89.7 08/12/2023   PLT 318.0 08/12/2023    Lab Results  Component Value Date   CREATININE 0.65 11/24/2023    No results found for: "PSA"  No results found for: "TESTOSTERONE"  Lab Results  Component Value Date   HGBA1C 6.0 11/24/2023    Urinalysis    Component Value Date/Time   COLORURINE YELLOW (A) 07/25/2022 1350   APPEARANCEUR Clear 01/27/2023 1007   LABSPEC 1.018 07/25/2022 1350   LABSPEC 1.015 12/17/2011 1804   PHURINE 5.0 07/25/2022 1350   GLUCOSEU Negative 01/27/2023 1007   GLUCOSEU NEGATIVE 01/02/2022 0909   HGBUR NEGATIVE 07/25/2022 1350   BILIRUBINUR Negative 01/27/2023 1007   BILIRUBINUR Negative 12/17/2011 1804   KETONESUR 5 (A) 07/25/2022 1350   PROTEINUR Negative 01/27/2023 1007   PROTEINUR NEGATIVE 07/25/2022 1350   UROBILINOGEN 0.2 01/02/2022 0909   NITRITE Negative 01/27/2023 1007   NITRITE NEGATIVE 07/25/2022 1350   LEUKOCYTESUR Negative 01/27/2023 1007   LEUKOCYTESUR NEGATIVE 07/25/2022 1350   LEUKOCYTESUR Negative 12/17/2011 1804    Pertinent Imaging:   Assessment & Plan: Call if culture positive.  She was to see physical therapy.  Pelvic floor physical therapy was not organized last time for some reason.  Reassess in 6 months  1. OAB (overactive bladder) (Primary)  - Urinalysis, Complete   No follow-ups on file.  Devorah Fonder, MD  [facility] [address], [address] [place], [place] [address] [phone]

## 2023-12-03 DIAGNOSIS — M5459 Other low back pain: Secondary | ICD-10-CM | POA: Diagnosis not present

## 2023-12-04 ENCOUNTER — Encounter: Payer: Self-pay | Admitting: Physical Therapy

## 2023-12-04 ENCOUNTER — Ambulatory Visit: Attending: Internal Medicine | Admitting: Physical Therapy

## 2023-12-04 ENCOUNTER — Telehealth: Payer: Self-pay

## 2023-12-04 DIAGNOSIS — R5382 Chronic fatigue, unspecified: Secondary | ICD-10-CM | POA: Diagnosis not present

## 2023-12-04 DIAGNOSIS — R269 Unspecified abnormalities of gait and mobility: Secondary | ICD-10-CM

## 2023-12-04 DIAGNOSIS — R2689 Other abnormalities of gait and mobility: Secondary | ICD-10-CM | POA: Diagnosis not present

## 2023-12-04 DIAGNOSIS — M6281 Muscle weakness (generalized): Secondary | ICD-10-CM

## 2023-12-04 DIAGNOSIS — R2681 Unsteadiness on feet: Secondary | ICD-10-CM | POA: Diagnosis not present

## 2023-12-04 DIAGNOSIS — M5459 Other low back pain: Secondary | ICD-10-CM

## 2023-12-04 NOTE — Telephone Encounter (Signed)
 Noted. Please try again to call and confirm she is doing ok.

## 2023-12-04 NOTE — Telephone Encounter (Signed)
 Copied from CRM [id number]. Topic: General - Other >> Dec 04, 2023 12:03 PM Lynn Mann wrote: Reason for CRM: Patient called in to let Dr. Geralyn Knee know she had a fall on 5/2 and went to ER with a CT done from head to hip.

## 2023-12-04 NOTE — Telephone Encounter (Signed)
 FYI for you. Tried to reach patient to check on her. Unable to reach

## 2023-12-04 NOTE — Therapy (Signed)
 OUTPATIENT PHYSICAL THERAPY LOWER EXTREMITY TREATMENT Physical Therapy Progress Note  Dates of reporting period  10/06/23   to   12/04/23   Patient Name: Lynn Mann MRN: [id number] DOB:[birth year], 73 y.o., female Today's Date: 12/04/2023  END OF SESSION:   PT End of Session - 12/09/23 2120     Visit Number 10    Number of Visits 12    Date for PT Re-Evaluation 12/17/23    PT Start Time 1431    PT Stop Time 1518    PT Time Calculation (min) 47 min             Past Medical History:  Diagnosis Date   Arthritis    C. difficile diarrhea    age 34s-40s    Chicken pox    Cholecystitis 11/2011   Did not require sgy - Dr. Karena Ota - [facility]  (cholelithiasis)   H/O Clostridium difficile infection    IBS (irritable bowel syndrome)    MRSA exposure 2005   Spider bite   MVP (mitral valve prolapse)    Stable - Dr. Cara Chancellor   Rheumatic fever    Past Surgical History:  Procedure Laterality Date   CATARACT EXTRACTION  [id number]   MUSCLE BIOPSY     Patient Active Problem List   Diagnosis Date Noted   Ear pain, left 11/17/2023   Knee pain 11/16/2023   Pelvic congestive syndrome 01/14/2023   Estrogen deficiency 06/23/2022   Nocturia 03/12/2022   Sleep apnea 03/12/2022   Muscle weakness 03/11/2022   Joint ache 02/11/2022   Bilateral ankle pain 02/05/2022   Abdominal pain 12/26/2021   Right shoulder pain 07/15/2021   Hematoma 02/11/2021   Ankle swelling 11/26/2020   Low back pain 11/22/2020   Hip pain, right 11/22/2020   Light headedness 06/24/2020   Hearing loss 06/24/2020   Binocular vision disorder with diplopia 06/24/2020   Stress 05/17/2020   Chest pain 05/16/2020   Welcome to Medicare preventive visit 06/20/2019   Viral syndrome 01/30/2019   Itching 07/21/2017   Asthma 07/16/2016   Urinary incontinence 07/16/2016   SOB (shortness of breath) 04/01/2016   Bilateral shoulder pain 02/24/2016   Fatigue 01/28/2016   Neck fullness 01/28/2016   Routine general medical  examination at a health care facility 07/25/2015   Health care maintenance 10/09/2014   Neck pain 11/26/2013   Hypercholesterolemia 11/26/2013   History of colonic polyps 05/11/2013   GERD (gastroesophageal reflux disease) 12/03/2012   Cholelithiasis 11/07/2012   IBS (irritable bowel syndrome) 11/07/2012   History of rheumatic fever 08/28/2012   MVP (mitral valve prolapse) 08/28/2012    PCP: Dellar Fenton, MD  REFERRING PROVIDER: Dellar Fenton, MD  REFERRING DIAG:    R53.83 (ICD-10-CM) - Other fatigue  M62.81 (ICD-10-CM) - Muscle weakness    THERAPY DIAG:  Muscle weakness (generalized)  Gait difficulty  Other abnormalities of gait and mobility  Unsteadiness on feet  Other low back pain  Chronic fatigue  Rationale for Evaluation and Treatment: Rehabilitation  ONSET DATE: chronic (>1 year)  SUBJECTIVE:   SUBJECTIVE STATEMENT: Got Covid 5 days before Christmas in 2023 and reports several complications (Gastritis/ Esophagitis/ 1 hiatal hernia and 1 umbilical hernia/ GERD).  Pt. Reports she is tired and out of shape.  Pt. Reports she has no endurance and requires seated rest breaks.  Pt. States she has pelvic congestion and feels weak in core.    PERTINENT HISTORY: Enjoys making vision boards.   Pt. Is busy with maintaining household  chores.  Goes to meeting at [employer] center and [employer].  Pt. Belongs to the [facility].    PAIN:  Are you having pain? No  PRECAUTIONS: None  RED FLAGS: None   WEIGHT BEARING RESTRICTIONS: No  FALLS:  Has patient fallen in last 6 months? No  LIVING ENVIRONMENT: Lives with: lives alone Lives in: House/apartment Stairs: Yes: External: 1 steps; none Has following equipment at home: None  OCCUPATION: Retired  PLOF: Independent  PATIENT GOALS: Increase LE and core muscle i and improve overall energy.    NEXT MD VISIT: PRN (blood work in April).    OBJECTIVE:  Note: Objective measures were completed at Evaluation unless  otherwise noted.  DIAGNOSTIC FINDINGS: N/A  PATIENT SURVEYS:  LEFS TBD next tx. session  COGNITION: Overall cognitive status: Within functional limits for tasks assessed    SENSATION: WFL  EDEMA:  NT  MUSCLE LENGTH: Hamstrings: Right 80 deg; Left 80 deg Thomas test: NT  POSTURE: rounded shoulders and forward head  PALPATION: NT  LOWER EXTREMITY ROM:  Active ROM Right eval Left eval  Hip flexion [facility]  Hip extension    Hip abduction    Hip adduction    Hip internal rotation    Hip external rotation    Knee flexion [facility]  Knee extension [facility]  Ankle dorsiflexion [facility] WFL  Ankle plantarflexion [facility] WFL  Ankle inversion    Ankle eversion     (Blank rows = not tested)  LOWER EXTREMITY MMT:  MMT Right eval Left eval  Hip flexion 4- 4-  Hip extension    Hip abduction 4 4  Hip adduction    Hip internal rotation    Hip external rotation    Knee flexion 4+ 4+  Knee extension 5 5  Ankle dorsiflexion 4 4  Ankle plantarflexion 4 4  Ankle inversion    Ankle eversion     (Blank rows = not tested)  L anterior thigh discomfort with bridging.    LOWER EXTREMITY SPECIAL TESTS:  NT  FUNCTIONAL TESTS:  5 times sit to stand:    Completed 3.5x with no UE assist.  19.22 seconds.    GAIT: Distance walked: in clinic Assistive device utilized: None Level of assistance: Complete Independence Comments: Forward posture with recip. Gait pattern.  Limited arm swing noted.  Consistent step length/ BOS noted.   Prolonged standing will increase nausea (>10 minutes)- no known cause.               10/13/23: LEFS: 34 out of 80  10/22/23:  5xSTS: 26.65 sec.  (Challenged with last 2 reps).  No UE support required.   11/19/23: 5xSTS: 36.26 second                                                                                                                    TREATMENT DATE: 12/04/2023   Subjective:  Pt. C/o 8/10 pain in R low back, esp with walking/ movement.  Pt. Had a  fall  on 5/3 and went to ER.  No fractures but increase c/o pain.  Pt. States she can't do bike today because of pressure on feet/ankles.  Pt. Has not started going to gym/ pool to complete ex. At this time.    EDUCATION: Lengthy discussion on pain/ LLD/ cramping in feet and recent fall  There.ex.:  No Nustep today.  Standing hip flexion/ abduction/ knee flexion 20x.    Supine marching 20x.  No bridging secondary to pain.    Sit to stands with light UE assist required for safety/ technique.  Pt. Reports STS causes increase in R hip/back symptoms.    Seated marching 2 x 10   Seated LAQ 2 x 10 each LE   Walking in //-bars with mirror feedback to assess gait/ hip symmetry/ posture.   Self-care/ Home mgmt.:  Reviewed shoes/ discussed supportive shoes and assessed gait in gym.  Pt. Prefers flat shoes with no arch support.   Pt. Has sneakers with orthotics but reports increase fall risk/ difficulty wearing.  Pt. Has hole in all 3 pairs of shoes at great toe.    Discussed benefits of aquatic ex./walking to improve strength with less pain/symptoms.   Pt. Has MD order for RW/rollator.  Pt. Will benefit from use of SPC or rollator to manage back/hip pain.    PATIENT EDUCATION:  Education details: Air traffic controller Person educated: Patient Education method: Medical illustrator Education comprehension: verbalized understanding and returned demonstration  HOME EXERCISE PROGRAM: Access Code: [facility] URL: [email or web address] Date: 10/08/2023 Prepared by: Hazeline Lister  Exercises - Mini Squat with Counter Support  - 1 x daily - 7 x weekly - 2 sets - 10 reps - Standing Hip Extension with Unilateral Counter Support  - 1 x daily - 7 x weekly - 2 sets - 10 reps - Standing Hip Abduction with Unilateral Counter Support  - 1 x daily - 7 x weekly - 2 sets - 10 reps - Standing March with Unilateral Counter Support  - 1 x daily - 7 x weekly - 2 sets - 10 reps -  Standing Knee Flexion with Unilateral Counter Support  - 1 x daily - 7 x weekly - 2 sets - 10 reps  ASSESSMENT:  CLINICAL IMPRESSION:    Patient arrives to treatment session with no AD and reports of a recent fall/ ER visit.   Also discussed attendance of [employer] for aquatic exercise to reduce load on joints as this is patient's biggest concern. Session focused on standing/ seated/ supine ex. With no resistance (pain tolerable movement patterns.   Pt. Continues to regress due to increase c/o pain/ limitations with mobility. Pt. will benefit from referral to aquatic ex. Program.  Probable discharge from PT next visit secondary to lack of progress/ worsening symptoms and PT recommends pt. Return to MD for f/u, which is scheduled in 2 weeks.    OBJECTIVE IMPAIRMENTS: decreased activity tolerance, decreased balance, decreased endurance, decreased mobility, difficulty walking, decreased ROM, decreased strength, dizziness, and postural dysfunction.   ACTIVITY LIMITATIONS: carrying, lifting, bending, standing, squatting, and locomotion level  PARTICIPATION LIMITATIONS: meal prep, cleaning, laundry, and community activity  PERSONAL FACTORS: Fitness and Past/current experiences are also affecting patient's functional outcome.   REHAB POTENTIAL: Good  CLINICAL DECISION MAKING: Evolving/moderate complexity  EVALUATION COMPLEXITY: High   GOALS: Goals reviewed with patient? Yes  SHORT TERM GOALS: Target date: 12/03/2023  Pt. Independent with HEP to increase B LE muscle strength 1/2 muscle grade to improve mobility.  Baseline:  see above Goal status: Not met  2.  Pt. Will complete 45 minutes of exercise/ PT with on rest breaks to improve overall endurance with daily/ community tasks.  Baseline:  limited endurance; 4/23: continues with limited endurance Goal status: Not met  LONG TERM GOALS: Target date: 12/17/2023  Pt. Will increase LEFS 15 points to improve pain-free mobility with daily tasks.    Baseline: 3/17: 34 out of 80.  4/14: 19 out of 80 (regression secondary to pain) Goal status: Not met  2.  Pt. Will complete 5xSTS in <20 sec. With no UE assist on armrest to improve independence with transfers.   Baseline: 3.5x with no UE assist.  19.22 seconds.  3/26: 27 sec.; 4/23: 36.26 sec Goal status: Not met  3.  Pt. Will progress to independent gym [facility]) based ex. Program to maintain LE strength gains to improve daily mobility/ endurance.  Baseline: pt. Member at [facility] but not attending. 4/23: not yet started  Goal status: Not met   PLAN:  PT FREQUENCY: 1-2x/week  PT DURATION: 4 weeks  PLANNED INTERVENTIONS: 97110-Therapeutic exercises, 97530- Therapeutic activity, 97112- Neuromuscular re-education, 97535- Self Care, 16109- Manual therapy, [phone]- Gait training, Patient/Family education, Balance training, Stair training, Cryotherapy, and Moist heat  PLAN FOR NEXT SESSION: Progress HEP/ walking endurance.  Discussed participating with aquatic ex. Since land-based ex. Has not benefited pt.    Lendell Quarry, PT, DPT # [phone] Physical Therapist - [facility]  12/09/2023, 9:21 PM

## 2023-12-05 DIAGNOSIS — M76822 Posterior tibial tendinitis, left leg: Secondary | ICD-10-CM | POA: Diagnosis not present

## 2023-12-05 DIAGNOSIS — M76821 Posterior tibial tendinitis, right leg: Secondary | ICD-10-CM | POA: Diagnosis not present

## 2023-12-05 DIAGNOSIS — K219 Gastro-esophageal reflux disease without esophagitis: Secondary | ICD-10-CM | POA: Diagnosis not present

## 2023-12-05 DIAGNOSIS — M65972 Unspecified synovitis and tenosynovitis, left ankle and foot: Secondary | ICD-10-CM | POA: Diagnosis not present

## 2023-12-05 DIAGNOSIS — M65971 Unspecified synovitis and tenosynovitis, right ankle and foot: Secondary | ICD-10-CM | POA: Diagnosis not present

## 2023-12-05 LAB — CULTURE, URINE COMPREHENSIVE

## 2023-12-05 NOTE — Telephone Encounter (Signed)
 Copied from CRM [id number]. Topic: General - Other >> Dec 05, 2023  3:14 PM Dyann Glaser G wrote: Reason for CRM: PT RETURNED CALL STATED SHE JUST WANTED TO LET DR Geralyn Knee ABOUT THE FALL, STATED SHE IS STILL IN A LITTLE PAIN, BUT DIFFICULTY WALKING A LITTLE, STATED SHE SAW THE PODIATRY TODAY AND GIVE HER SHOT IN BOTH ANKLES. SHE ALSO WENT TO PHYSICAL THERAPY. STATED THE ER PRESCIRBED HER lidocaine  patches but pharmacy advised her the ER can not prescribe she will need to get from her PCP.

## 2023-12-05 NOTE — Telephone Encounter (Signed)
Pt aware.

## 2023-12-05 NOTE — Telephone Encounter (Signed)
 LMTCB

## 2023-12-05 NOTE — Telephone Encounter (Signed)
 Can get salon pas over the counter - these are lidocaine  patches.

## 2023-12-09 ENCOUNTER — Ambulatory Visit: Admitting: Physical Therapy

## 2023-12-09 DIAGNOSIS — R2681 Unsteadiness on feet: Secondary | ICD-10-CM

## 2023-12-09 DIAGNOSIS — M5459 Other low back pain: Secondary | ICD-10-CM

## 2023-12-09 DIAGNOSIS — R269 Unspecified abnormalities of gait and mobility: Secondary | ICD-10-CM

## 2023-12-09 DIAGNOSIS — M6281 Muscle weakness (generalized): Secondary | ICD-10-CM | POA: Diagnosis not present

## 2023-12-09 DIAGNOSIS — R2689 Other abnormalities of gait and mobility: Secondary | ICD-10-CM

## 2023-12-13 NOTE — Therapy (Signed)
 OUTPATIENT PHYSICAL THERAPY LOWER EXTREMITY TREATMENT/ DISCHARGE  Patient Name: Lynn Mann MRN: [id number] DOB:[birth year], 73 y.o., female Today's Date: 12/09/2023  END OF SESSION:   PT End of Session - 12/13/23 0728     Visit Number 11    Number of Visits 12    Date for PT Re-Evaluation 12/17/23    PT Start Time 1301    PT Stop Time 1352    PT Time Calculation (min) 51 min             Past Medical History:  Diagnosis Date   Arthritis    C. difficile diarrhea    age 103s-40s    Chicken pox    Cholecystitis 11/2011   Did not require sgy - Dr. Karena Ota - [facility]  (cholelithiasis)   H/O Clostridium difficile infection    IBS (irritable bowel syndrome)    MRSA exposure 2005   Spider bite   MVP (mitral valve prolapse)    Stable - Dr. Cara Chancellor   Rheumatic fever    Past Surgical History:  Procedure Laterality Date   CATARACT EXTRACTION  [id number]   MUSCLE BIOPSY     Patient Active Problem List   Diagnosis Date Noted   Ear pain, left 11/17/2023   Knee pain 11/16/2023   Pelvic congestive syndrome 01/14/2023   Estrogen deficiency 06/23/2022   Nocturia 03/12/2022   Sleep apnea 03/12/2022   Muscle weakness 03/11/2022   Joint ache 02/11/2022   Bilateral ankle pain 02/05/2022   Abdominal pain 12/26/2021   Right shoulder pain 07/15/2021   Hematoma 02/11/2021   Ankle swelling 11/26/2020   Low back pain 11/22/2020   Hip pain, right 11/22/2020   Light headedness 06/24/2020   Hearing loss 06/24/2020   Binocular vision disorder with diplopia 06/24/2020   Stress 05/17/2020   Chest pain 05/16/2020   Welcome to Medicare preventive visit 06/20/2019   Viral syndrome 01/30/2019   Itching 07/21/2017   Asthma 07/16/2016   Urinary incontinence 07/16/2016   SOB (shortness of breath) 04/01/2016   Bilateral shoulder pain 02/24/2016   Fatigue 01/28/2016   Neck fullness 01/28/2016   Routine general medical examination at a health care facility 07/25/2015   Health care  maintenance 10/09/2014   Neck pain 11/26/2013   Hypercholesterolemia 11/26/2013   History of colonic polyps 05/11/2013   GERD (gastroesophageal reflux disease) 12/03/2012   Cholelithiasis 11/07/2012   IBS (irritable bowel syndrome) 11/07/2012   History of rheumatic fever 08/28/2012   MVP (mitral valve prolapse) 08/28/2012    PCP: Dellar Fenton, MD  REFERRING PROVIDER: Dellar Fenton, MD  REFERRING DIAG:    R53.83 (ICD-10-CM) - Other fatigue  M62.81 (ICD-10-CM) - Muscle weakness    THERAPY DIAG:  Muscle weakness (generalized)  Gait difficulty  Other abnormalities of gait and mobility  Unsteadiness on feet  Other low back pain  Rationale for Evaluation and Treatment: Rehabilitation  ONSET DATE: chronic (>1 year)  SUBJECTIVE:   SUBJECTIVE STATEMENT: Got Covid 5 days before Christmas in 2023 and reports several complications (Gastritis/ Esophagitis/ 1 hiatal hernia and 1 umbilical hernia/ GERD).  Pt. Reports she is tired and out of shape.  Pt. Reports she has no endurance and requires seated rest breaks.  Pt. States she has pelvic congestion and feels weak in core.    PERTINENT HISTORY: Enjoys making vision boards.   Pt. Is busy with maintaining household chores.  Goes to meeting at [employer] center and [employer].  Pt. Belongs to the [employer]    PAIN:  Are you having pain? No  PRECAUTIONS: None  RED FLAGS: None   WEIGHT BEARING RESTRICTIONS: No  FALLS:  Has patient fallen in last 6 months? No  LIVING ENVIRONMENT: Lives with: lives alone Lives in: House/apartment Stairs: Yes: External: 1 steps; none Has following equipment at home: None  OCCUPATION: Retired  PLOF: Independent  PATIENT GOALS: Increase LE and core muscle i and improve overall energy.    NEXT MD VISIT: PRN (blood work in April).    OBJECTIVE:  Note: Objective measures were completed at Evaluation unless otherwise noted.  DIAGNOSTIC FINDINGS: N/A  PATIENT SURVEYS:  LEFS TBD next tx.  session  COGNITION: Overall cognitive status: Within functional limits for tasks assessed    SENSATION: WFL  EDEMA:  NT  MUSCLE LENGTH: Hamstrings: Right 80 deg; Left 80 deg Thomas test: NT  POSTURE: rounded shoulders and forward head  PALPATION: NT  LOWER EXTREMITY ROM:  Active ROM Right eval Left eval  Hip flexion [facility]  Hip extension    Hip abduction    Hip adduction    Hip internal rotation    Hip external rotation    Knee flexion [facility]  Knee extension [facility]  Ankle dorsiflexion [facility] WFL  Ankle plantarflexion [facility] WFL  Ankle inversion    Ankle eversion     (Blank rows = not tested)  LOWER EXTREMITY MMT:  MMT Right eval Left eval  Hip flexion 4- 4-  Hip extension    Hip abduction 4 4  Hip adduction    Hip internal rotation    Hip external rotation    Knee flexion 4+ 4+  Knee extension 5 5  Ankle dorsiflexion 4 4  Ankle plantarflexion 4 4  Ankle inversion    Ankle eversion     (Blank rows = not tested)  L anterior thigh discomfort with bridging.    LOWER EXTREMITY SPECIAL TESTS:  NT  FUNCTIONAL TESTS:  5 times sit to stand:    Completed 3.5x with no UE assist.  19.22 seconds.    GAIT: Distance walked: in clinic Assistive device utilized: None Level of assistance: Complete Independence Comments: Forward posture with recip. Gait pattern.  Limited arm swing noted.  Consistent step length/ BOS noted.   Prolonged standing will increase nausea (>10 minutes)- no known cause.               10/13/23: LEFS: 34 out of 80  10/22/23:  5xSTS: 26.65 sec.  (Challenged with last 2 reps).  No UE support required.   11/19/23: 5xSTS: 36.26 second                                                                                                                    TREATMENT DATE: 12/09/2023   Subjective:  Pt. Entered PT with flip flops and brought sneakers so PT could assess gait pattern.  Pt. Continues to reports persistent pain in multiple areas, esp. With  increase activity/ walking.  Pt. Has not started going to gym/  pool to complete ex. At this time.    EDUCATION: Lengthy discussion on pain/ hip weakness/ benefits of going to pool at gym for exercise instead of land based ex. At this time due to pain in multiple areas.    There.ex.:  Seated Nustep L1-2 for 5 min. with B UE only.  Pt. Fearful of increase B LE symptoms/ pain with use of LE, esp. In ankles.    Standing hip flexion/ abduction/ knee flexion 20x.    Supine hip abduction/ TrA muscle activation with marching/ pelvic mvmt.    Discussed foot posture with use of sneakers/ flip flops.  Mirror feedback gait in clinic.  Amb. At agility ladder with focus on step pattern/ length.     Sit to stands with light UE assist required for safety/ technique.  Pt. Reports STS causes increase in R hip/back symptoms.    Walking in //-bars with mirror feedback to assess gait/ hip symmetry/ posture.   Self-care/ Home mgmt.:  See above  Reviewed shoes/ discussed supportive shoes and assessed gait in gym.  Pt. Prefers flat shoes with no arch support.   Pt. Has sneakers with orthotics but reports increase fall risk/ difficulty wearing.  Pt. Has hole in all 3 pairs of shoes at great toe.    Discussed benefits of aquatic ex./walking to improve strength with less pain/symptoms.   Pt. Has MD order for RW/rollator.  Pt. Will benefit from use of SPC or rollator to manage back/hip pain.    PATIENT EDUCATION:  Education details: Air traffic controller Person educated: Patient Education method: Medical illustrator Education comprehension: verbalized understanding and returned demonstration  HOME EXERCISE PROGRAM: Access Code: [facility] URL: [email or web address] Date: 10/08/2023 Prepared by: Hazeline Lister  Exercises - Mini Squat with Counter Support  - 1 x daily - 7 x weekly - 2 sets - 10 reps - Standing Hip Extension with Unilateral Counter Support  - 1 x daily - 7 x weekly  - 2 sets - 10 reps - Standing Hip Abduction with Unilateral Counter Support  - 1 x daily - 7 x weekly - 2 sets - 10 reps - Standing March with Unilateral Counter Support  - 1 x daily - 7 x weekly - 2 sets - 10 reps - Standing Knee Flexion with Unilateral Counter Support  - 1 x daily - 7 x weekly - 2 sets - 10 reps  ASSESSMENT:  CLINICAL IMPRESSION:    Patient arrives to treatment session with no AD and continued c/o hip/LE pain. Also discussed attendance of [employer] for aquatic exercise to reduce load on joints as this is patient's biggest concern. Session focused on standing/ seated/ supine ex. With no resistance (pain tolerable movement patterns).   Pt. Continues to regress due to increase c/o pain/ limitations with mobility. Pt. will benefit from referral to aquatic ex. Program.  Discharge from PT at this time secondary to lack of progress/ worsening symptoms and PT recommends pt. Return to MD for f/u, which is scheduled next week.     OBJECTIVE IMPAIRMENTS: decreased activity tolerance, decreased balance, decreased endurance, decreased mobility, difficulty walking, decreased ROM, decreased strength, dizziness, and postural dysfunction.   ACTIVITY LIMITATIONS: carrying, lifting, bending, standing, squatting, and locomotion level  PARTICIPATION LIMITATIONS: meal prep, cleaning, laundry, and community activity  PERSONAL FACTORS: Fitness and Past/current experiences are also affecting patient's functional outcome.   REHAB POTENTIAL: Good  CLINICAL DECISION MAKING: Evolving/moderate complexity  EVALUATION COMPLEXITY: High   GOALS: Goals reviewed with patient? Yes  SHORT  TERM GOALS: Target date: 12/03/2023  Pt. Independent with HEP to increase B LE muscle strength 1/2 muscle grade to improve mobility.    Baseline:  see above Goal status: Not met  2.  Pt. Will complete 45 minutes of exercise/ PT with on rest breaks to improve overall endurance with daily/ community tasks.  Baseline:   limited endurance; 4/23: continues with limited endurance Goal status: Not met  LONG TERM GOALS: Target date: 12/17/2023  Pt. Will increase LEFS 15 points to improve pain-free mobility with daily tasks.   Baseline: 3/17: 34 out of 80.  4/14: 19 out of 80 (regression secondary to pain) Goal status: Not met  2.  Pt. Will complete 5xSTS in <20 sec. With no UE assist on armrest to improve independence with transfers.   Baseline: 3.5x with no UE assist.  19.22 seconds.  3/26: 27 sec.; 4/23: 36.26 sec Goal status: Not met  3.  Pt. Will progress to independent gym [facility]) based ex. Program to maintain LE strength gains to improve daily mobility/ endurance.  Baseline: pt. Member at [facility] but not attending. 4/23: not yet started  Goal status: Not met   PLAN:  PT FREQUENCY: 1-2x/week  PT DURATION: 4 weeks  PLANNED INTERVENTIONS: 97110-Therapeutic exercises, 97530- Therapeutic activity, 97112- Neuromuscular re-education, 97535- Self Care, [id number]- Manual therapy, [phone]- Gait training, Patient/Family education, Balance training, Stair training, Cryotherapy, and Moist heat  PLAN FOR NEXT SESSION: Discharge from PT.  Discussed participating with aquatic ex. Since land-based ex. Has not benefited pt.    Lendell Quarry, PT, DPT # [phone] Physical Therapist - [facility]  12/13/2023, 7:29 AM

## 2023-12-15 ENCOUNTER — Other Ambulatory Visit (INDEPENDENT_AMBULATORY_CARE_PROVIDER_SITE_OTHER)

## 2023-12-15 ENCOUNTER — Other Ambulatory Visit

## 2023-12-15 DIAGNOSIS — R42 Dizziness and giddiness: Secondary | ICD-10-CM | POA: Diagnosis not present

## 2023-12-15 DIAGNOSIS — H6983 Other specified disorders of Eustachian tube, bilateral: Secondary | ICD-10-CM | POA: Diagnosis not present

## 2023-12-15 DIAGNOSIS — H903 Sensorineural hearing loss, bilateral: Secondary | ICD-10-CM | POA: Diagnosis not present

## 2023-12-15 DIAGNOSIS — E7800 Pure hypercholesterolemia, unspecified: Secondary | ICD-10-CM

## 2023-12-16 ENCOUNTER — Encounter (INDEPENDENT_AMBULATORY_CARE_PROVIDER_SITE_OTHER): Payer: Self-pay

## 2023-12-16 LAB — CBC WITH DIFFERENTIAL/PLATELET
Basophils Absolute: 0.1 10*3/uL (ref 0.0–0.1)
Basophils Relative: 1.3 % (ref 0.0–3.0)
Eosinophils Absolute: 0 10*3/uL (ref 0.0–0.7)
Eosinophils Relative: 0.5 % (ref 0.0–5.0)
HCT: 40.7 % (ref 36.0–46.0)
Hemoglobin: 13.3 g/dL (ref 12.0–15.0)
Lymphocytes Relative: 30.2 % (ref 12.0–46.0)
Lymphs Abs: 1.8 10*3/uL (ref 0.7–4.0)
MCHC: 32.8 g/dL (ref 30.0–36.0)
MCV: 88.3 fl (ref 78.0–100.0)
Monocytes Absolute: 0.4 10*3/uL (ref 0.1–1.0)
Monocytes Relative: 6.3 % (ref 3.0–12.0)
Neutro Abs: 3.7 10*3/uL (ref 1.4–7.7)
Neutrophils Relative %: 61.7 % (ref 43.0–77.0)
Platelets: 330 10*3/uL (ref 150.0–400.0)
RBC: 4.6 Mil/uL (ref 3.87–5.11)
RDW: 14.2 % (ref 11.5–15.5)
WBC: 6.1 10*3/uL (ref 4.0–10.5)

## 2023-12-16 LAB — LIPID PANEL
Cholesterol: 192 mg/dL (ref 0–200)
HDL: 48.8 mg/dL (ref >39.00)
LDL Cholesterol: 107 mg/dL — ABNORMAL HIGH (ref 0–99)
NonHDL: 142.99
Total CHOL/HDL Ratio: 4
Triglycerides: 182 mg/dL — ABNORMAL HIGH (ref 0.0–149.0)
VLDL: 36.4 mg/dL (ref 0.0–40.0)

## 2023-12-16 LAB — HEPATIC FUNCTION PANEL
ALT: 15 U/L (ref 0–35)
AST: 16 U/L (ref 0–37)
Albumin: 4.2 g/dL (ref 3.5–5.2)
Alkaline Phosphatase: 52 U/L (ref 39–117)
Bilirubin, Direct: 0.1 mg/dL (ref 0.0–0.3)
Total Bilirubin: 0.8 mg/dL (ref 0.2–1.2)
Total Protein: 6.7 g/dL (ref 6.0–8.3)

## 2023-12-16 LAB — BASIC METABOLIC PANEL WITH GFR
BUN: 17 mg/dL (ref 6–23)
CO2: 24 meq/L (ref 19–32)
Calcium: 9.4 mg/dL (ref 8.4–10.5)
Chloride: 105 meq/L (ref 96–112)
Creatinine, Ser: 0.72 mg/dL (ref 0.40–1.20)
GFR: 84.22 mL/min (ref >60.00)
Glucose, Bld: 100 mg/dL — ABNORMAL HIGH (ref 70–99)
Potassium: 4.3 meq/L (ref 3.5–5.1)
Sodium: 141 meq/L (ref 135–145)

## 2023-12-16 LAB — TSH: TSH: 2.07 u[IU]/mL (ref 0.35–5.50)

## 2023-12-17 ENCOUNTER — Ambulatory Visit: Payer: Self-pay | Admitting: Internal Medicine

## 2023-12-18 ENCOUNTER — Ambulatory Visit (INDEPENDENT_AMBULATORY_CARE_PROVIDER_SITE_OTHER): Admitting: Internal Medicine

## 2023-12-18 VITALS — BP 112/70 | HR 80 | Temp 98.0°F | Resp 16 | Ht 67.0 in | Wt 186.6 lb

## 2023-12-18 DIAGNOSIS — Z860100 Personal history of colon polyps, unspecified: Secondary | ICD-10-CM | POA: Diagnosis not present

## 2023-12-18 DIAGNOSIS — Z0000 Encounter for general adult medical examination without abnormal findings: Secondary | ICD-10-CM

## 2023-12-18 DIAGNOSIS — M2559 Pain in other specified joint: Secondary | ICD-10-CM | POA: Diagnosis not present

## 2023-12-18 DIAGNOSIS — R0602 Shortness of breath: Secondary | ICD-10-CM

## 2023-12-18 DIAGNOSIS — K219 Gastro-esophageal reflux disease without esophagitis: Secondary | ICD-10-CM

## 2023-12-18 DIAGNOSIS — E7800 Pure hypercholesterolemia, unspecified: Secondary | ICD-10-CM

## 2023-12-18 LAB — VITAMIN D 25 HYDROXY (VIT D DEFICIENCY, FRACTURES): VITD: 12.81 ng/mL — ABNORMAL LOW (ref 30.00–100.00)

## 2023-12-18 LAB — SEDIMENTATION RATE: Sed Rate: 6 mm/h (ref 0–30)

## 2023-12-18 LAB — VITAMIN B12: Vitamin B-12: 259 pg/mL (ref 211–911)

## 2023-12-18 LAB — C-REACTIVE PROTEIN: CRP: <1 mg/dL (ref 0.5–20.0)

## 2023-12-18 NOTE — Progress Notes (Unsigned)
 Subjective:    Patient ID: Lynn Mann, female    DOB: 04-05-1951, 73 y.o.   MRN: [id number]  Patient here for  Chief Complaint  Patient presents with   Annual Exam    HPI Here for a physical exam.  history of hypercholesterolemia, sleep apnea and GERD. Saw cardiology 04/30/23 - reported DOE - recommended stress echo and added toprol XL 12.5mg  prn. Stress echo 05/13/23 - normal. Saw GI - colonoscopy and EGD unremarkable. No stenosis - SMA. Work up - vascular congestion. CTA did not show evidence of potential median arcuate ligament compression that was demonstrated potentially on ultrasound. Referred to vascular for second opinion. Saw Dr Vonna Guardian. Recommended vascular procedure. Desired not to schedule at that time. Discussed today. Continues to want to hold on any further vascular testing. Given patient continued to have postprandial abdominal pain immediately after eating and is having to liquefy foods - a HIDA scan scheduled. Gallbladder - EF - decreased. Have discussed cholecystectomy. Discussed again today. Wants to hold on further evaluation/procedures. Had f/u 10/29/23 - cardiology. No further cardiac diagnostic testing recommended. Felt stable. Continue crestor  and toprol. Evaluated by Daivd Dub - bilateral ankle pain 11/24/23 - advised compression stockings and referral to podiatry. Saw Dr Althea Atkinson 12/05/23 - diagnosed with synovitis and tenosynovitis of both ankles. Recommended cortisone injection. Helped. Evaluated in ER 11/29/23 - slipped on soap. Had CT head - no acute intracranial process. CT c-spine, thoracic spine and lumbar spine - degenerative changes. Lumbar spine - stable grade 1 anterolisthesis at L4-5 and mild narrowing bilaterally at L3-4. Went to PT. Has been discharged. Reports balance was an issue. They gave her exercise to work on at home. She reports noticing worsening sob with exertion recently. Had recent cardiology evaluation.    Past Medical History:  Diagnosis Date    Arthritis    C. difficile diarrhea    age 49s-40s    Chicken pox    Cholecystitis 11/2011   Did not require sgy - Dr. Karena Ota - [facility]  (cholelithiasis)   H/O Clostridium difficile infection    IBS (irritable bowel syndrome)    MRSA exposure 2005   Spider bite   MVP (mitral valve prolapse)    Stable - Dr. Cara Chancellor   Rheumatic fever    Past Surgical History:  Procedure Laterality Date   CATARACT EXTRACTION  [id number]   MUSCLE BIOPSY     Family History  Problem Relation Age of Onset   Arthritis Mother    Stroke Mother    Diabetes Mother    Hypertension Mother    Arthritis Father    Stroke Father    Diabetes Paternal Grandmother    Cancer Paternal Uncle        colon   Heart disease Other        maternal and paternal side   Breast cancer Paternal Aunt    Breast cancer Cousin    Breast cancer Cousin        female cousin   Breast cancer Cousin    Social History   Socioeconomic History   Marital status: Widowed    Spouse name: Not on file   Number of children: Not on file   Years of education: 16   Highest education level: Not on file  Occupational History   Occupation: Retired  Tobacco Use   Smoking status: Never    Passive exposure: Never   Smokeless tobacco: Never  Vaping Use   Vaping status: Never Used  Substance and  Sexual Activity   Alcohol use: Not Currently    Comment: Rarely   Drug use: No   Sexual activity: Yes    Partners: Male    Birth control/protection: Post-menopausal  Other Topics Concern   Not on file  Social History Narrative   Widowed    2 kids son and daughter   Social Drivers of Health   Financial Resource Strain: Low Risk  (07/04/2023)   Overall Financial Resource Strain (CARDIA)    Difficulty of Paying Living Expenses: Not hard at all  Food Insecurity: No Food Insecurity (07/04/2023)   Hunger Vital Sign    Worried About Running Out of Food in the Last Year: Never true    Ran Out of Food in the Last Year: Never true  Transportation  Needs: No Transportation Needs (07/04/2023)   PRAPARE - Administrator, Civil Service (Medical): No    Lack of Transportation (Non-Medical): No  Physical Activity: Inactive (07/04/2023)   Exercise Vital Sign    Days of Exercise per Week: 0 days    Minutes of Exercise per Session: 0 min  Stress: No Stress Concern Present (07/04/2023)   [employer] of Occupational Health - Occupational Stress Questionnaire    Feeling of Stress : Not at all  Social Connections: Moderately Integrated (07/04/2023)   Social Connection and Isolation Panel [NHANES]    Frequency of Communication with Friends and Family: More than three times a week    Frequency of Social Gatherings with Friends and Family: More than three times a week    Attends Religious Services: More than 4 times per year    Active Member of [employer] or Organizations: Yes    Attends Banker Meetings: More than 4 times per year    Marital Status: Widowed     Review of Systems  Constitutional:  Negative for appetite change and unexpected weight change.  HENT:  Negative for congestion and sinus pressure.   Respiratory:  Negative for cough and chest tightness.        SOB with exertion as outlined.   Cardiovascular:  Negative for chest pain, palpitations and leg swelling.  Gastrointestinal:  Negative for abdominal pain, diarrhea, nausea and vomiting.  Genitourinary:  Negative for difficulty urinating and dysuria.  Musculoskeletal:  Negative for joint swelling and myalgias.  Skin:  Negative for color change and rash.  Neurological:  Negative for dizziness and headaches.  Psychiatric/Behavioral:  Negative for agitation and dysphoric mood.        Objective:     BP 112/70   Pulse 80   Temp 98 F (36.7 C)   Resp 16   Ht 5\' 7"  (1.702 m)   Wt 186 lb 9.6 oz (84.6 kg)   SpO2 98%   BMI 29.23 kg/m  Wt Readings from Last 3 Encounters:  12/18/23 186 lb 9.6 oz (84.6 kg)  12/01/23 185 lb (83.9 kg)  11/24/23 184 lb  3.2 oz (83.6 kg)    Physical Exam Vitals reviewed.  Constitutional:      General: She is not in acute distress.    Appearance: Normal appearance.  HENT:     Head: Normocephalic and atraumatic.     Right Ear: External ear normal.     Left Ear: External ear normal.     Mouth/Throat:     Pharynx: No oropharyngeal exudate or posterior oropharyngeal erythema.  Eyes:     General: No scleral icterus.       Right eye: No discharge.  Left eye: No discharge.     Conjunctiva/sclera: Conjunctivae normal.  Neck:     Thyroid : No thyromegaly.  Cardiovascular:     Rate and Rhythm: Normal rate and regular rhythm.  Pulmonary:     Effort: No respiratory distress.     Breath sounds: Normal breath sounds. No wheezing.  Abdominal:     General: Bowel sounds are normal.     Palpations: Abdomen is soft.     Tenderness: There is no abdominal tenderness.  Musculoskeletal:        General: No swelling or tenderness.     Cervical back: Neck supple. No tenderness.  Lymphadenopathy:     Cervical: No cervical adenopathy.  Skin:    Findings: No erythema or rash.  Neurological:     Mental Status: She is alert.  Psychiatric:        Mood and Affect: Mood normal.        Behavior: Behavior normal.         Outpatient Encounter Medications as of 12/18/2023  Medication Sig   albuterol  (VENTOLIN  HFA) 108 (90 Base) MCG/ACT inhaler Inhale 2 puffs into the lungs every 4 (four) hours as needed for wheezing or shortness of breath.   cetirizine  (ZYRTEC ) 10 MG tablet Take 1 tablet (10 mg total) by mouth daily as needed for allergies.   Cholecalciferol (VITAMIN D3 PO) Take 1 tablet by mouth daily.   esomeprazole  (NEXIUM ) 40 MG capsule Take 1 capsule (40 mg total) by mouth daily.   estradiol (ESTRACE) 0.1 MG/GM vaginal cream Insert fingertip unit vaginally and on urethra nightly x 2 weeks, then every other night x 2 weeks, then 2-3 times weekly for maintenance   fluticasone  (FLONASE ) 50 MCG/ACT nasal spray  Place 2 sprays into both nostrils daily. prn   ipratropium (ATROVENT ) 0.03 % nasal spray Place 2 sprays into both nostrils every 12 (twelve) hours.   Multiple Vitamin (MULTIVITAMIN) tablet Take 1 tablet by mouth daily.   Multiple Vitamins-Minerals (PRESERVISION AREDS PO) Take by mouth.   Polyvinyl Alcohol-Povidone (REFRESH OP) Apply 1 drop to eye as needed.   rosuvastatin  (CRESTOR ) 5 MG tablet TAKE 1 TABLET(5 MG) BY MOUTH DAILY   saccharomyces boulardii (FLORASTOR) 250 MG capsule Take 1 capsule (250 mg total) by mouth daily.   senna (SENOKOT) 8.6 MG TABS tablet Take 1 tablet by mouth daily as needed for mild constipation.   No facility-administered encounter medications on file as of 12/18/2023.     Lab Results  Component Value Date   WBC 6.1 12/15/2023   HGB 13.3 12/15/2023   HCT 40.7 12/15/2023   PLT 330.0 12/15/2023   GLUCOSE 100 (H) 12/15/2023   CHOL 192 12/15/2023   TRIG 182.0 (H) 12/15/2023   HDL 48.80 12/15/2023   LDLDIRECT 110.0 02/11/2017   LDLCALC 107 (H) 12/15/2023   ALT 15 12/15/2023   AST 16 12/15/2023   NA 141 12/15/2023   K 4.3 12/15/2023   CL 105 12/15/2023   CREATININE 0.72 12/15/2023   BUN 17 12/15/2023   CO2 24 12/15/2023   TSH 2.07 12/15/2023   HGBA1C 6.0 11/24/2023    CT Lumbar Spine Wo Contrast Result Date: 11/29/2023 CLINICAL DATA:  Back trauma.  Fell in shower last night.  Pain. EXAM: CT LUMBAR SPINE WITHOUT CONTRAST TECHNIQUE: Multidetector CT imaging of the lumbar spine was performed without intravenous contrast administration. Multiplanar CT image reconstructions were also generated. RADIATION DOSE REDUCTION: This exam was performed according to the departmental dose-optimization program which includes automated exposure  control, adjustment of the mA and/or kV according to patient size and/or use of iterative reconstruction technique. COMPARISON:  Lumbar spine radiographs 09/26/2021. FINDINGS: Segmentation: 5 non rib-bearing lumbar type vertebral bodies  are present. The lowest fully formed vertebral body is L5. Alignment: 4 mm grade 1 anterolisthesis at L4-5 is stable. Mild leftward curvature is centered at L3-4 is also stable. Vertebrae: Vertebral body heights are normal. No acute fractures are present. Paraspinal and other soft tissues: Atherosclerotic changes are present in the aorta branch vessels. No aneurysm is present. Kidneys and visualized ureters are within normal limits bilaterally. No significant adenopathy is present. The paraspinous soft tissues are within normal limits. Disc levels: L1-2: Insert normal disc L2-3: Moderate bilateral facet hypertrophy is present. Leftward disc bulging is present. No focal stenosis is present. L3-4: A broad-based disc protrusion and moderate facet hypertrophy is present. Mild foraminal narrowing is present bilaterally. L4-5: A broad-based disc protrusion is present. Moderate facet hypertrophy is noted bilaterally. No focal stenosis is present. L5-S1: A broad-based disc protrusion is present. Moderate facet hypertrophy is present bilaterally. No focal stenosis is present. IMPRESSION: 1. No acute fracture or traumatic subluxation. 2. Stable grade 1 anterolisthesis at L4-5. 3. Mild foraminal narrowing bilaterally at L3-4. 4. Moderate facet hypertrophy at L2-3, L3-4, L4-5, and L5-S1. Electronically Signed   By: Audree Leas M.D.   On: 11/29/2023 16:22   CT Head Wo Contrast Result Date: 11/29/2023 CLINICAL DATA:  Head trauma, fall. EXAM: CT HEAD WITHOUT CONTRAST CT CERVICAL SPINE WITHOUT CONTRAST TECHNIQUE: Multidetector CT imaging of the head and cervical spine was performed following the standard protocol without intravenous contrast. Multiplanar CT image reconstructions of the cervical spine were also generated. RADIATION DOSE REDUCTION: This exam was performed according to the departmental dose-optimization program which includes automated exposure control, adjustment of the mA and/or kV according to patient  size and/or use of iterative reconstruction technique. COMPARISON:  09/04/2022, 11/11/2021. FINDINGS: CT HEAD FINDINGS Brain: No acute intracranial hemorrhage, midline shift, or mass effect is seen. Mild periventricular white matter hypodensities are present bilaterally. No hydrocephalus. Vascular: No hyperdense vessel or unexpected calcification. Skull: Normal. Negative for fracture or focal lesion. Sinuses/Orbits: There is partial opacification of the sphenoid sinus on the left. Other: Few opacities are noted in the inferior aspect of the mastoid air cells on the left CT CERVICAL SPINE FINDINGS Alignment: There is mild anterolisthesis at C3-C4 and C4-C5 and C5-C6. Skull base and vertebrae: Vertebral body heights are stable. No acute fracture is seen. Soft tissues and spinal canal: No prevertebral fluid or swelling. No visible canal hematoma. Disc levels: There are multilevel degenerative endplate changes and facet arthropathy Upper chest: No acute abnormality. Other: Minimal carotid artery calcification on the left. IMPRESSION: 1. No acute intracranial process. 2. Multilevel degenerative changes in the cervical spine without evidence of acute fracture. Electronically Signed   By: Wyvonnia Heimlich M.D.   On: 11/29/2023 16:21   CT Cervical Spine Wo Contrast Result Date: 11/29/2023 CLINICAL DATA:  Head trauma, fall. EXAM: CT HEAD WITHOUT CONTRAST CT CERVICAL SPINE WITHOUT CONTRAST TECHNIQUE: Multidetector CT imaging of the head and cervical spine was performed following the standard protocol without intravenous contrast. Multiplanar CT image reconstructions of the cervical spine were also generated. RADIATION DOSE REDUCTION: This exam was performed according to the departmental dose-optimization program which includes automated exposure control, adjustment of the mA and/or kV according to patient size and/or use of iterative reconstruction technique. COMPARISON:  09/04/2022, 11/11/2021. FINDINGS: CT HEAD FINDINGS  Brain:  No acute intracranial hemorrhage, midline shift, or mass effect is seen. Mild periventricular white matter hypodensities are present bilaterally. No hydrocephalus. Vascular: No hyperdense vessel or unexpected calcification. Skull: Normal. Negative for fracture or focal lesion. Sinuses/Orbits: There is partial opacification of the sphenoid sinus on the left. Other: Few opacities are noted in the inferior aspect of the mastoid air cells on the left CT CERVICAL SPINE FINDINGS Alignment: There is mild anterolisthesis at C3-C4 and C4-C5 and C5-C6. Skull base and vertebrae: Vertebral body heights are stable. No acute fracture is seen. Soft tissues and spinal canal: No prevertebral fluid or swelling. No visible canal hematoma. Disc levels: There are multilevel degenerative endplate changes and facet arthropathy Upper chest: No acute abnormality. Other: Minimal carotid artery calcification on the left. IMPRESSION: 1. No acute intracranial process. 2. Multilevel degenerative changes in the cervical spine without evidence of acute fracture. Electronically Signed   By: Wyvonnia Heimlich M.D.   On: 11/29/2023 16:21   CT Thoracic Spine Wo Contrast Result Date: 11/29/2023 CLINICAL DATA:  Trauma EXAM: CT THORACIC SPINE WITHOUT CONTRAST TECHNIQUE: Multidetector CT images of the thoracic were obtained using the standard protocol without intravenous contrast. RADIATION DOSE REDUCTION: This exam was performed according to the departmental dose-optimization program which includes automated exposure control, adjustment of the mA and/or kV according to patient size and/or use of iterative reconstruction technique. COMPARISON:  None Available. FINDINGS: Alignment: Normal. Vertebrae: No acute fracture or focal pathologic process. Paraspinal and other soft tissues: Negative. Disc levels: There is mild disc space narrowing and endplate osteophyte formation throughout the mid thoracic spine. There is no central spinal canal or neural  foramina stenosis. IMPRESSION: 1. No acute fracture or traumatic subluxation of the thoracic spine. 2. Mild degenerative changes of the mid thoracic spine. Electronically Signed   By: Tyron Gallon M.D.   On: 11/29/2023 16:13   DG Hip Unilat W or Wo Pelvis 2-3 Views Right Result Date: 11/29/2023 CLINICAL DATA:  Right hip pain following a fall. EXAM: DG HIP (WITH OR WITHOUT PELVIS) 2-3V RIGHT COMPARISON:  None Available. FINDINGS: There is no evidence of hip fracture or dislocation. There is no evidence of arthropathy or other focal bone abnormality. IMPRESSION: Negative. Electronically Signed   By: Catherin Closs M.D.   On: 11/29/2023 16:10       Assessment & Plan:  Hypercholesterolemia Assessment & Plan: Continue crestor . Low cholesterol diet and exercise. Follow lipid panel and liver function tests. No changes today in medication. The 10-year ASCVD risk score (Arnett DK, et al., 2019) is: 8.3%   Values used to calculate the score:     Age: 15 years     Sex: Female     Is Non-Hispanic African American: No     Diabetic: No     Tobacco smoker: No     Systolic Blood Pressure: 112 mmHg     Is BP treated: No     HDL Cholesterol: 48.8 mg/dL     Total Cholesterol: 192 mg/dL   Orders: -     Amb Ref to Medical Weight Management  SOB (shortness of breath) Assessment & Plan: Saw cardiology 04/30/23 - reported DOE - recommended stress echo and added toprol XL 12.5mg  prn. Stress echo 05/13/23 - normal. Wants to hold on further evaluation/procedures. Had f/u 10/29/23 - cardiology. No further cardiac diagnostic testing recommended. Felt stable. Continue crestor  and toprol. EKG - SR with no acute ischemic changes. Discussed given persistent sob, referral to pulmonary for further evaluation. Recent cxr -  no acute abnormality.   Orders: -     EKG 12-Lead -     VITAMIN D 25 Hydroxy (Vit-D Deficiency, Fractures) -     Vitamin B12 -     Pulmonary Visit  Pain in other joint Assessment & Plan: Joint aches  as outlined.  Check ESR and CRP. Stretches. Exercise.   Orders: -     Sedimentation rate -     C-reactive protein -     Amb Ref to Medical Weight Management  Gastroesophageal reflux disease, unspecified whether esophagitis present Assessment & Plan: No increased upper symptoms reported. Continue nexium .   Orders: -     Amb Ref to Medical Weight Management  History of colonic polyps Assessment & Plan: Colonoscopy 09/2022 - unremarkable.    Routine general medical examination at a health care facility     Dellar Fenton, MD

## 2023-12-18 NOTE — Assessment & Plan Note (Addendum)
 Continue crestor . Low cholesterol diet and exercise. Follow lipid panel and liver function tests. The 10-year ASCVD risk score (Arnett DK, et al., 2019) is: 8.3%   Values used to calculate the score:     Age: 73 years     Sex: Female     Is Non-Hispanic African American: No     Diabetic: No     Tobacco smoker: No     Systolic Blood Pressure: 112 mmHg     Is BP treated: No     HDL Cholesterol: 48.8 mg/dL     Total Cholesterol: 192 mg/dL

## 2023-12-19 ENCOUNTER — Ambulatory Visit: Payer: Self-pay

## 2023-12-19 ENCOUNTER — Ambulatory Visit: Payer: Self-pay | Admitting: Internal Medicine

## 2023-12-19 NOTE — Telephone Encounter (Signed)
 Caller transferred to this Clinical research associate. Patient refusing to speak with nurse triage unless they are physically in Dr. Thayne Fine office. Called to [facility] spoke with Jullie Oiler, nobody is available in office to speak with patient. This Clinical research associate returned to call with patient and explained situation, Rakhi accepted triage by this Clinical research associate.   Chief Complaint: blood glucose elevated Symptoms: fatigue, brain fog Frequency: onset just prior to call Pertinent Negatives: Patient denies increased thirst, blurred vision Disposition: [] ED /[] Urgent Care (no appt availability in office) / [] Appointment(In office/virtual)/ []  [facility]/ [x] Home Care/ [] Refused Recommended Disposition /[] [facility]/ []  Follow-up with PCP Additional Notes:   She has been on steroids which has been effecting her blood sugar, Dr. Geralyn Knee is aware. Today felt sleepy and spacy so she checked her sugar which resulted 265. Denies all other symptoms. She had two slices of bread, granola cereal, couple spoons of sugar.  Generally drinks apple sugar vinegar with cinammon which brings down her blood sugar, she has not done this yet as she just took blood sugar prior to calling in. While on call she reports that blood sugar is going down and now at 252. Home care advised. Educated on care advice as documented in protocol, patient verbalized understanding. Discussed reasons to call back.     Copied from CRM [id number]. Topic: Clinical - Red Word Triage >> Dec 19, 2023  4:30 PM Martinique E wrote: [facility] that prompted transfer to Nurse Triage: Elevated blood sugar. Patient stated her blood sugar today was 265. Patient stated feeling "spacey" and more tired. Reason for Disposition  [1] Caller demands to speak with the PCP AND [2] about sick adult (or sick caller)  [1] Blood glucose 240 - 300 mg/dL (81.1 - 91.4 mmol/L) AND [2] does not  use insulin (e.g., not insulin-dependent; most people with type 2 diabetes)  Protocols used:  Diabetes - High Blood Sugar-A-AH, Difficult Call-A-AH

## 2023-12-22 ENCOUNTER — Encounter: Payer: Self-pay | Admitting: Internal Medicine

## 2023-12-22 NOTE — Assessment & Plan Note (Signed)
 Saw cardiology 04/30/23 - reported DOE - recommended stress echo and added toprol XL 12.5mg  prn. Stress echo 05/13/23 - normal. Wants to hold on further evaluation/procedures. Had f/u 10/29/23 - cardiology. No further cardiac diagnostic testing recommended. Felt stable. Continue crestor  and toprol. EKG - SR with no acute ischemic changes. Discussed given persistent sob, referral to pulmonary for further evaluation. Recent cxr - no acute abnormality.

## 2023-12-22 NOTE — Assessment & Plan Note (Signed)
 No increased upper symptoms reported. Continue nexium .

## 2023-12-22 NOTE — Assessment & Plan Note (Signed)
 Joint aches as outlined.  Check ESR and CRP. Stretches. Exercise.

## 2023-12-22 NOTE — Assessment & Plan Note (Signed)
Colonoscopy 09/2022 - unremarkable.

## 2023-12-23 ENCOUNTER — Telehealth: Payer: Self-pay

## 2023-12-23 MED ORDER — VITAMIN D (ERGOCALCIFEROL) 1.25 MG (50000 UNIT) PO CAPS: 50000.0000 [IU] | ORAL_CAPSULE | ORAL | 0 refills | Status: DC

## 2023-12-23 NOTE — Telephone Encounter (Signed)
 Called patient regarding another message. She did not mention this

## 2023-12-23 NOTE — Telephone Encounter (Signed)
 Copied from CRM [id number]. Topic: General - Other >> Dec 23, 2023 10:18 AM Kita Perish H wrote: Reason for CRM: Patient would like to speak with CMA only, returning call to office. Please reach back out.  Ashleah [phone]

## 2023-12-23 NOTE — Telephone Encounter (Signed)
 See other note

## 2023-12-24 ENCOUNTER — Ambulatory Visit: Payer: Self-pay | Admitting: *Deleted

## 2023-12-24 ENCOUNTER — Ambulatory Visit (INDEPENDENT_AMBULATORY_CARE_PROVIDER_SITE_OTHER)

## 2023-12-24 DIAGNOSIS — D518 Other vitamin B12 deficiency anemias: Secondary | ICD-10-CM | POA: Diagnosis not present

## 2023-12-24 MED ORDER — CYANOCOBALAMIN 1000 MCG/ML IJ SOLN
1000.0000 ug | Freq: Once | INTRAMUSCULAR | Status: AC
  Administered 2023-12-24: 1000 ug via INTRAMUSCULAR

## 2023-12-24 NOTE — Telephone Encounter (Signed)
 Patient returning call that she received on yesterday . Calling cal now and was put on hold Waiting for them to come back to the phone . Aaron Aas Waited on hold for someone for like 10 minutes . No one never came back to the phones . Let patient know that the office is busy and that I would send this message for the nurse to give her a call  back  [phone] Ms. Lynn Mann

## 2023-12-24 NOTE — Progress Notes (Signed)
 Patient was administered a B12 injection into her left deltoid. Patient tolerated the B12 injection well.

## 2023-12-24 NOTE — Telephone Encounter (Signed)
 Copied from CRM [id number]. Topic: Clinical - Red Word Triage >> Dec 24, 2023  2:33 PM [place] wrote: Red Word that prompted transfer to Nurse Triage: Patient was just stung by a flying insect on hand. Swollen, put ice on it, burning. Reason for Disposition  Painful insect bite  Answer Assessment - Initial Assessment Questions 1. TYPE of INSECT: "What type of insect was it?"      I got stung by something.   I moved a curtain and a cloud of flying insects came out.   I don't know if there is a nest in there or not.   It was in my garage.   Several landed on my hand left pinky finger.   It's not swollen but it's red.    I've never had a allergic reaction to stings.    It's burning.    I've never allergic an allergic reaction to it.    2. ONSET: "When did you get bitten?"      Happened 20 min. Ago.     Denies any shortness of breath, wheezing, throat feeling like it's closing, chest tightness.   Keep ice on it.   You can also   3. LOCATION: "Where is the insect bite located?"      My left pinky finger.    4. REDNESS: "Is the area red or pink?" If Yes, ask: "What size is area of redness?" (inches or cm). "When did the redness start?"     It's red.   No stinger in place.   It burns and is very slightly swollen.   5. PAIN: "Is there any pain?" If Yes, ask: "How bad is it?"  (Scale 1-10; or mild, moderate, severe)     Yes it's burning 6. ITCHING: "Does it itch?" If Yes, ask: "How bad is the itch?"    - MILD: doesn't interfere with normal activities   - MODERATE-SEVERE: interferes with work, school, sleep, or other activities      Yes  7. SWELLING: "How big is the swelling?" (inches, cm, or compare to coins)     Slightly swollen  8. OTHER SYMPTOMS: "Do you have any other symptoms?"  (e.g., difficulty breathing, hives)     No  9. PREGNANCY: "Is there any chance you are pregnant?" "When was your last menstrual period?"     N/A due to age  Protocols used: Insect Bite-A-AH  Chief Complaint:  Stung by a flying insect about 20 minutes ago on left pinky finger.    Symptoms: It's red and burning and a little swollen.    Frequency: 20 min ago stung.   Pertinent Negatives: Patient denies shortness of breath, wheezing, throat closing, dizziness or prior reactions to being stung. Disposition: [] ED /[] Urgent Care (no appt availability in office) / [] Appointment(In office/virtual)/ []  [facility]/ [x] Home Care/ [] Refused Recommended Disposition /[] [facility]/ []  Follow-up with PCP Additional Notes: Went over the home care advice with her.   Also went over to call 911 if she developed shortness of breath, wheezing, chest tightness, dizziness.   She was agreeable to this plan.

## 2023-12-24 NOTE — Telephone Encounter (Signed)
 Spoke with the pt and scheduled for 03/16/24 with Dr. Marygrace Snellen.

## 2023-12-25 NOTE — Telephone Encounter (Signed)
 LM to follow up with patient.

## 2023-12-26 DIAGNOSIS — H81391 Other peripheral vertigo, right ear: Secondary | ICD-10-CM | POA: Diagnosis not present

## 2023-12-26 DIAGNOSIS — M6281 Muscle weakness (generalized): Secondary | ICD-10-CM | POA: Diagnosis not present

## 2023-12-26 NOTE — Telephone Encounter (Signed)
 Copied from CRM [id number]. Topic: General - Inquiry >> Dec 26, 2023  2:59 PM [place] wrote: Reason for CRM: Please have Chadwick Colonel, CMA call patient back.

## 2023-12-26 NOTE — Telephone Encounter (Signed)
Attempted to call.  No answer.  Mailbox is full.

## 2023-12-29 ENCOUNTER — Encounter: Payer: Self-pay | Admitting: Pulmonary Disease

## 2023-12-29 ENCOUNTER — Ambulatory Visit: Admitting: Pulmonary Disease

## 2023-12-29 VITALS — BP 120/78 | HR 92 | Temp 97.1°F | Ht 67.0 in | Wt 188.4 lb

## 2023-12-29 DIAGNOSIS — Z8616 Personal history of COVID-19: Secondary | ICD-10-CM | POA: Diagnosis not present

## 2023-12-29 DIAGNOSIS — R0602 Shortness of breath: Secondary | ICD-10-CM

## 2023-12-29 LAB — NITRIC OXIDE: Nitric Oxide: 20

## 2023-12-29 MED ORDER — FLUTICASONE-SALMETEROL 250-50 MCG/ACT IN AEPB: 1.0000 | INHALATION_SPRAY | Freq: Two times a day (BID) | RESPIRATORY_TRACT | 3 refills | Status: DC

## 2023-12-30 NOTE — Progress Notes (Signed)
 Synopsis: Referred in by Dellar Fenton, MD   Subjective:   PATIENT ID: Lynn Mann GENDER: female DOB: 14-Nov-1950, MRN: [id number]  Chief Complaint  Patient presents with   Consult    DOE. No wheezing. Cough.    HPI Lynn Mann is a pleasant 73 year old female patient with a past medical history of allergic rhinitis, hyperlipidemia, GERD presenting today to the pulmonary clinic for further evaluation of shortness of breath specifically on exertion.  She reports that she has been experiencing progressively worsening shortness of breath specifically on exertion.  Described as inability to feel her lungs with air.  She had COVID about a year ago and since then has worsened.  It is associated with dry coughing spells intermittently.  Her chest does feel heavy sometimes but she denies any chest pain. '  She was given the diagnosis of asthma about 15 years ago however after couple of years someone told her she does not have asthma'.   She denies any hypersensitivity to heat or humidity, cold air or strong scents.  She underwent a stress echo in October 2024 which was normal.   CXR 03/2023 - Unremarkable.   Family history -daughter with asthma.  Social history -never smoker, denies alcohol use.  Denies any pets at home.     ROS All systems were reviewed and are negative except for the above.  Objective:   Vitals:   12/29/23 1614  BP: 120/78  Pulse: 92  Temp: (!) 97.1 F (36.2 C)  SpO2: 97%  Weight: 188 lb 6.4 oz (85.5 kg)  Height: 5\' 7"  (1.702 m)   97% on RA BMI Readings from Last 3 Encounters:  12/29/23 29.51 kg/m  12/18/23 29.23 kg/m  12/01/23 28.98 kg/m   Wt Readings from Last 3 Encounters:  12/29/23 188 lb 6.4 oz (85.5 kg)  12/18/23 186 lb 9.6 oz (84.6 kg)  12/01/23 185 lb (83.9 kg)    Physical Exam GEN: NAD, Healthy Appearing HEENT: Supple Neck, Reactive Pupils, EOMI  CVS: Normal S1, Normal S2, RRR, No murmurs or ES appreciated  Lungs: Clear  bilateral air entry.  Abdomen: Soft, non tender, non distended, + BS  Extremities: Warm and well perfused, No edema  Skin: No suspicious lesions appreciated  Psych: Normal Affect  Ancillary Information   CBC    Component Value Date/Time   WBC 6.1 12/15/2023 1403   RBC 4.60 12/15/2023 1403   HGB 13.3 12/15/2023 1403   HGB 14.1 12/17/2011 1654   HCT 40.7 12/15/2023 1403   HCT 42.5 12/17/2011 1654   PLT 330.0 12/15/2023 1403   PLT 271 12/17/2011 1654   MCV 88.3 12/15/2023 1403   MCV 88 12/17/2011 1654   MCH 29.2 08/02/2022 1809   MCHC 32.8 12/15/2023 1403   RDW 14.2 12/15/2023 1403   RDW 13.3 12/17/2011 1654   LYMPHSABS 1.8 12/15/2023 1403   MONOABS 0.4 12/15/2023 1403   EOSABS 0.0 12/15/2023 1403   BASOSABS 0.1 12/15/2023 1403   Labs and imaging were reviewed.     Latest Ref Rng & Units 05/16/2016    3:02 PM  PFT Results  FVC-Pre L 3.35  P  FVC-Predicted Pre % 93  P  FVC-Post L 3.38  P  FVC-Predicted Post % 94  P  Pre FEV1/FVC % % 81  P  Post FEV1/FCV % % 82  P  FEV1-Pre L 2.72  P  FEV1-Predicted Pre % 98  P  FEV1-Post L 2.78  P  DLCO uncorrected ml/min/mmHg 23.78  P  DLCO UNC% % 84  P  DLVA Predicted % 89  P    P Preliminary result     Assessment & Plan:  Lynn Mann is a pleasant 73 year old female patient with a past medical history of allergic rhinitis, hyperlipidemia, GERD presenting today to the pulmonary clinic for further evaluation of shortness of breath specifically on exertion.  My impression is that her dyspnea on exertion, chest tightness and dry coughing spells that worsened post-COVID is suggestive of long-haul COVID.  Feno 20 indeterminate for the presence of intrapulmonary eosinophilic inflammation  Cardiac workup has been negative so far.  I will obtain PFTs to further evaluate for obstructive lung disease.  We will also trial inhaled ICS/LABA and assess response in 3 months.  Return in about 3 months (around 03/30/2024).  I spent 60  minutes caring for this patient today, including preparing to see the patient, obtaining a medical history , reviewing a separately obtained history, performing a medically appropriate examination and/or evaluation, counseling and educating the patient/family/caregiver, ordering medications, tests, or procedures, documenting clinical information in the electronic health record, and independently interpreting results (not separately reported/billed) and communicating results to the patient/family/caregiver  Annitta Kindler, MD [facility] 12/30/2023 9:33 AM

## 2023-12-30 NOTE — Telephone Encounter (Signed)
 LM for patient

## 2023-12-30 NOTE — Telephone Encounter (Signed)
 See other note

## 2023-12-30 NOTE — Telephone Encounter (Signed)
 Insect bite is better. Nothing further needed at this time.

## 2023-12-31 ENCOUNTER — Ambulatory Visit: Admitting: *Deleted

## 2023-12-31 DIAGNOSIS — H81391 Other peripheral vertigo, right ear: Secondary | ICD-10-CM | POA: Diagnosis not present

## 2023-12-31 DIAGNOSIS — E538 Deficiency of other specified B group vitamins: Secondary | ICD-10-CM | POA: Diagnosis not present

## 2023-12-31 DIAGNOSIS — M6281 Muscle weakness (generalized): Secondary | ICD-10-CM | POA: Diagnosis not present

## 2023-12-31 MED ORDER — CYANOCOBALAMIN 1000 MCG/ML IJ SOLN
1000.0000 ug | Freq: Once | INTRAMUSCULAR | Status: AC
  Administered 2023-12-31: 1000 ug via INTRAMUSCULAR

## 2023-12-31 NOTE — Progress Notes (Signed)
Pt received B12 injection in right deltoid muscle. Pt tolerated it well with no complaints or concerns.  

## 2024-01-02 DIAGNOSIS — M6281 Muscle weakness (generalized): Secondary | ICD-10-CM | POA: Diagnosis not present

## 2024-01-02 DIAGNOSIS — H81391 Other peripheral vertigo, right ear: Secondary | ICD-10-CM | POA: Diagnosis not present

## 2024-01-05 DIAGNOSIS — M6281 Muscle weakness (generalized): Secondary | ICD-10-CM | POA: Diagnosis not present

## 2024-01-05 DIAGNOSIS — H81391 Other peripheral vertigo, right ear: Secondary | ICD-10-CM | POA: Diagnosis not present

## 2024-01-06 ENCOUNTER — Telehealth: Payer: Self-pay

## 2024-01-06 NOTE — Telephone Encounter (Signed)
 Copied from CRM [id number]. Topic: Clinical - Medication Question >> Jan 06, 2024  3:18 PM Juliana Ocean wrote: Reason for CRM: 1.  pt prescribed fluticasone -salmeterol (WIXELA INHUB) 250-50 MCG/ACT AEPB.  It has fluticasone  in it.  Pt uses that spray 2 X /day.  Is it OK to use these 2 together, since it is same med? Pt using the nasal spray due to blocked  Eustachian tubes. 2.  Pt uses albuterol  (VENTOLIN  HFA) 108 (90 Base) MCG/ACT inhaler.  Is it OK to use this inhaler w/ the wixela as well?

## 2024-01-06 NOTE — Telephone Encounter (Signed)
NA. No voicemail.  

## 2024-01-07 NOTE — Telephone Encounter (Signed)
 I have notified the patient. Nothing further needed.

## 2024-01-08 ENCOUNTER — Ambulatory Visit: Admitting: *Deleted

## 2024-01-08 VITALS — BP 110/70 | HR 104 | Temp 98.2°F | Resp 18 | Ht 67.0 in

## 2024-01-08 DIAGNOSIS — E538 Deficiency of other specified B group vitamins: Secondary | ICD-10-CM

## 2024-01-08 DIAGNOSIS — H81391 Other peripheral vertigo, right ear: Secondary | ICD-10-CM | POA: Diagnosis not present

## 2024-01-08 DIAGNOSIS — M6281 Muscle weakness (generalized): Secondary | ICD-10-CM | POA: Diagnosis not present

## 2024-01-08 MED ORDER — CYANOCOBALAMIN 1000 MCG/ML IJ SOLN
1000.0000 ug | Freq: Once | INTRAMUSCULAR | Status: AC
  Administered 2024-01-08: 1000 ug via INTRAMUSCULAR

## 2024-01-08 NOTE — Progress Notes (Signed)
 Pt asked me to check her pulse because she felt that it was high. Pule was 104. After I had her remove her N95 she was able to relax a little more so that I could check her remaining vitals. BP: 110/70 & 02: 95. Temp: 98.2.  Pt denied chest pain & states that this happens to her here and there. Pulmonology gave her a new medication to start on but she has not started on it. I advised her to reach out to them & she refused to go to ED or UC for further evaluation.  I walked pt out to monitor and she had no trouble or distress at that time.

## 2024-01-08 NOTE — Progress Notes (Signed)
Pt received B12 injection in left deltoid muscle. Pt tolerated it well with no complaints or concerns.  

## 2024-01-13 DIAGNOSIS — H81391 Other peripheral vertigo, right ear: Secondary | ICD-10-CM | POA: Diagnosis not present

## 2024-01-13 DIAGNOSIS — M6281 Muscle weakness (generalized): Secondary | ICD-10-CM | POA: Diagnosis not present

## 2024-01-15 ENCOUNTER — Ambulatory Visit

## 2024-01-15 DIAGNOSIS — E538 Deficiency of other specified B group vitamins: Secondary | ICD-10-CM | POA: Diagnosis not present

## 2024-01-15 DIAGNOSIS — H81391 Other peripheral vertigo, right ear: Secondary | ICD-10-CM | POA: Diagnosis not present

## 2024-01-15 DIAGNOSIS — M6281 Muscle weakness (generalized): Secondary | ICD-10-CM | POA: Diagnosis not present

## 2024-01-15 MED ORDER — CYANOCOBALAMIN 1000 MCG/ML IJ SOLN
1000.0000 ug | Freq: Once | INTRAMUSCULAR | Status: AC
Start: 2024-01-15 — End: 2024-01-15
  Administered 2024-01-15: 1000 ug via INTRAMUSCULAR

## 2024-01-15 NOTE — Progress Notes (Signed)
 Patient presented for B 12 injection to right deltoid, patient voiced no concerns nor showed any signs of distress during injection.

## 2024-01-26 ENCOUNTER — Encounter (INDEPENDENT_AMBULATORY_CARE_PROVIDER_SITE_OTHER): Payer: Self-pay

## 2024-02-18 DIAGNOSIS — M6281 Muscle weakness (generalized): Secondary | ICD-10-CM | POA: Diagnosis not present

## 2024-02-18 DIAGNOSIS — H81391 Other peripheral vertigo, right ear: Secondary | ICD-10-CM | POA: Diagnosis not present

## 2024-02-20 ENCOUNTER — Encounter: Admitting: Family Medicine

## 2024-02-25 ENCOUNTER — Telehealth: Payer: Self-pay

## 2024-02-25 MED ORDER — ALBUTEROL SULFATE HFA 108 (90 BASE) MCG/ACT IN AERS: 2.0000 | INHALATION_SPRAY | RESPIRATORY_TRACT | 0 refills | Status: DC | PRN
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
## 2024-02-25 NOTE — Telephone Encounter (Signed)
 Refill sent. Mailbox full.

## 2024-02-25 NOTE — Telephone Encounter (Signed)
 Ok to refill albuterol  for her to use prn? She has not refilled since last July. We have seen her recently for a regular appointment. She is not having any new acute symptoms. Has a scheduled follow up in August

## 2024-02-25 NOTE — Telephone Encounter (Signed)
Ok to refill albuterol. °

## 2024-02-25 NOTE — Addendum Note (Signed)
 Addended by: LEARTA PORTO D on: 02/25/2024 10:36 AM   Modules accepted: Orders

## 2024-02-26 ENCOUNTER — Ambulatory Visit: Payer: Self-pay

## 2024-02-26 NOTE — Telephone Encounter (Unsigned)
 Copied from CRM [id number]. Topic: General - Other >> Feb 26, 2024  4:57 PM Sophia H wrote: Reason for CRM: Patient states she had a message about an after visit summary, don't see anything in chart but do see that LPN Sueanne left a voicemail around 4:26p. Tried to contact CAL but no answer, patients call line dropped.  Please reach out to patient! Ty
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
## 2024-02-26 NOTE — Telephone Encounter (Signed)
Attempted to call. No answer. No voicemail.
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
## 2024-02-26 NOTE — Telephone Encounter (Signed)
 LMTCB

## 2024-02-26 NOTE — Telephone Encounter (Signed)
 Copied from CRM [id number]. Topic: General - Other >> Feb 26, 2024  4:59 PM Chasity T wrote: Reason for CRM: Patient is returning call to [place]. Please contact patient back

## 2024-02-29 DIAGNOSIS — S8002XA Contusion of left knee, initial encounter: Secondary | ICD-10-CM | POA: Diagnosis not present

## 2024-02-29 DIAGNOSIS — S8001XA Contusion of right knee, initial encounter: Secondary | ICD-10-CM | POA: Diagnosis not present

## 2024-03-02 ENCOUNTER — Ambulatory Visit (INDEPENDENT_AMBULATORY_CARE_PROVIDER_SITE_OTHER)

## 2024-03-02 ENCOUNTER — Encounter: Payer: Self-pay | Admitting: Internal Medicine

## 2024-03-02 ENCOUNTER — Ambulatory Visit: Admitting: Internal Medicine

## 2024-03-02 ENCOUNTER — Telehealth: Payer: Self-pay | Admitting: Internal Medicine

## 2024-03-02 VITALS — BP 114/72 | HR 89 | Temp 97.9°F | Resp 20 | Ht 67.0 in | Wt 188.5 lb

## 2024-03-02 DIAGNOSIS — F439 Reaction to severe stress, unspecified: Secondary | ICD-10-CM

## 2024-03-02 DIAGNOSIS — R053 Chronic cough: Secondary | ICD-10-CM

## 2024-03-02 DIAGNOSIS — R0989 Other specified symptoms and signs involving the circulatory and respiratory systems: Secondary | ICD-10-CM

## 2024-03-02 DIAGNOSIS — J4520 Mild intermittent asthma, uncomplicated: Secondary | ICD-10-CM | POA: Diagnosis not present

## 2024-03-02 DIAGNOSIS — K219 Gastro-esophageal reflux disease without esophagitis: Secondary | ICD-10-CM | POA: Diagnosis not present

## 2024-03-02 MED ORDER — ESOMEPRAZOLE MAGNESIUM 40 MG PO CPDR: 40.0000 mg | DELAYED_RELEASE_CAPSULE | Freq: Every day | ORAL | 1 refills | Status: AC

## 2024-03-02 MED ORDER — PREDNISONE 10 MG PO TABS
ORAL_TABLET | ORAL | 0 refills | Status: DC
Start: 2024-03-02 — End: 2024-03-23

## 2024-03-02 MED ORDER — LEVALBUTEROL HCL 1.25 MG/3ML IN NEBU
1.2500 mg | INHALATION_SOLUTION | Freq: Once | RESPIRATORY_TRACT | Status: AC
Start: 2024-03-02 — End: 2024-03-02
  Administered 2024-03-02: 1.25 mg via RESPIRATORY_TRACT
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
## 2024-03-02 NOTE — Telephone Encounter (Signed)
 FYI.[email or web address] call wanting Dr. Glendia to know that her Mychart is not working on her tablet and wanting a phone call instead including vm.

## 2024-03-03 ENCOUNTER — Ambulatory Visit: Payer: Self-pay | Admitting: Internal Medicine

## 2024-03-03 NOTE — Telephone Encounter (Signed)
 Pt already received Chest X-ray results.

## 2024-03-03 NOTE — Telephone Encounter (Signed)
 Copied from CRM #[id number]. Topic: Clinical - Lab/Test Results >> Mar 03, 2024 10:33 AM Laymon HERO wrote: Reason for CRM: Relayed message of chest xray to patient

## 2024-03-04 ENCOUNTER — Telehealth: Payer: Self-pay | Admitting: Internal Medicine

## 2024-03-04 NOTE — Telephone Encounter (Signed)
 Pt called to request, in her words, a foil pack with instructions for this medication ( predniSONE  (DELTASONE ) 10 MG tablet ) Please reach out to her for any further information or instructions. [facility]

## 2024-03-05 NOTE — Telephone Encounter (Signed)
 Left message to return call to our office.

## 2024-03-05 NOTE — Telephone Encounter (Signed)
 Spoke to pt. Informed pt to decrease a 1/2 tab each day she takes after starting with the 1st dosage of 4 pills. Pt verbalized understanding. Informed pt if she get confused or runs out before hand to let us  know.

## 2024-03-05 NOTE — Telephone Encounter (Unsigned)
 Copied from CRM [id number]. Topic: Clinical - Medication Question >> Mar 05, 2024  9:36 AM Mia F wrote: Reason for CRM: Pt says she is returning office call. She has questions about the predniSONE  (DELTASONE ) 10 MG tablet.

## 2024-03-07 ENCOUNTER — Encounter: Payer: Self-pay | Admitting: Internal Medicine
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
## 2024-03-07 NOTE — Assessment & Plan Note (Signed)
 No increased upper symptoms reported. Continue nexium .

## 2024-03-07 NOTE — Assessment & Plan Note (Signed)
 Increased stress as outlined.  Discussed. Treat current symptoms as outlined. Follow.

## 2024-03-07 NOTE — Assessment & Plan Note (Signed)
.   Dr Malka felt her DOE and chest tightness is suggestive of long haul covid. Recommended PFTs. Trial of ICS/LABA.

## 2024-03-08 ENCOUNTER — Telehealth: Payer: Self-pay | Admitting: Internal Medicine

## 2024-03-14 ENCOUNTER — Other Ambulatory Visit: Payer: Self-pay | Admitting: Internal Medicine

## 2024-03-15 ENCOUNTER — Telehealth: Payer: Self-pay

## 2024-03-15 DIAGNOSIS — E559 Vitamin D deficiency, unspecified: Secondary | ICD-10-CM

## 2024-03-15 DIAGNOSIS — E7800 Pure hypercholesterolemia, unspecified: Secondary | ICD-10-CM
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
## 2024-03-15 NOTE — Telephone Encounter (Signed)
 Received request for refill prescription vitamin D . Per last lab result note, she was to take the prescription vitamin D  for 12 weeks and then start over the counter vitamin D3 1000 units per day.  Rx declined.

## 2024-03-16 ENCOUNTER — Encounter: Admitting: Pulmonary Disease

## 2024-03-16 NOTE — Telephone Encounter (Signed)
 LM to clarify with patient but we prescribed this in May. Do you just want to repeat vitamin d  level with 8/22 labs to determine if she needs the medication?

## 2024-03-17 NOTE — Addendum Note (Signed)
 Addended by: LEARTA PORTO D on: 03/17/2024 11:29 AM   Modules accepted: Orders

## 2024-03-17 NOTE — Telephone Encounter (Signed)
 Unable to leave message for patient. Vit D level added to labs.

## 2024-03-17 NOTE — Telephone Encounter (Signed)
 I am ok to recheck the vitamin D  level.

## 2024-03-19 ENCOUNTER — Telehealth: Payer: Self-pay | Admitting: Internal Medicine

## 2024-03-19 ENCOUNTER — Other Ambulatory Visit (INDEPENDENT_AMBULATORY_CARE_PROVIDER_SITE_OTHER)

## 2024-03-19 DIAGNOSIS — E7800 Pure hypercholesterolemia, unspecified: Secondary | ICD-10-CM

## 2024-03-19 DIAGNOSIS — E559 Vitamin D deficiency, unspecified: Secondary | ICD-10-CM

## 2024-03-19 LAB — BASIC METABOLIC PANEL WITH GFR
BUN: 8 mg/dL (ref 6–23)
CO2: 26 meq/L (ref 19–32)
Calcium: 8.8 mg/dL (ref 8.4–10.5)
Chloride: 104 meq/L (ref 96–112)
Creatinine, Ser: 0.71 mg/dL (ref 0.40–1.20)
GFR: 85.49 mL/min (ref >60.00)
Glucose, Bld: 107 mg/dL — ABNORMAL HIGH (ref 70–99)
Potassium: 4.2 meq/L (ref 3.5–5.1)
Sodium: 140 meq/L (ref 135–145)

## 2024-03-19 LAB — HEPATIC FUNCTION PANEL
ALT: 12 U/L (ref 0–35)
AST: 17 U/L (ref 0–37)
Albumin: 4.1 g/dL (ref 3.5–5.2)
Alkaline Phosphatase: 68 U/L (ref 39–117)
Bilirubin, Direct: 0.1 mg/dL (ref 0.0–0.3)
Total Bilirubin: 1 mg/dL (ref 0.2–1.2)
Total Protein: 6.7 g/dL (ref 6.0–8.3)

## 2024-03-19 LAB — LIPID PANEL
Cholesterol: 201 mg/dL — ABNORMAL HIGH (ref 0–200)
HDL: 36.9 mg/dL — ABNORMAL LOW (ref >39.00)
LDL Cholesterol: 115 mg/dL — ABNORMAL HIGH (ref 0–99)
NonHDL: 163.78
Total CHOL/HDL Ratio: 5
Triglycerides: 244 mg/dL — ABNORMAL HIGH (ref 0.0–149.0)
VLDL: 48.8 mg/dL — ABNORMAL HIGH (ref 0.0–40.0)

## 2024-03-19 LAB — VITAMIN D 25 HYDROXY (VIT D DEFICIENCY, FRACTURES): VITD: 18.02 ng/mL — ABNORMAL LOW (ref 30.00–100.00)
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
## 2024-03-20 ENCOUNTER — Ambulatory Visit: Payer: Self-pay | Admitting: Internal Medicine

## 2024-03-22 NOTE — Telephone Encounter (Signed)
 SABRA

## 2024-03-22 NOTE — Telephone Encounter (Signed)
 Will clarify at appt tomorrow.

## 2024-03-22 NOTE — Telephone Encounter (Signed)
 Pt has appt tomorrow. Ok to complete?

## 2024-03-22 NOTE — Telephone Encounter (Signed)
 Ok to complete. Confirm reason.

## 2024-03-23 ENCOUNTER — Ambulatory Visit (INDEPENDENT_AMBULATORY_CARE_PROVIDER_SITE_OTHER): Admitting: Internal Medicine

## 2024-03-23 ENCOUNTER — Encounter: Payer: Self-pay | Admitting: Internal Medicine

## 2024-03-23 VITALS — BP 122/70 | HR 73 | Resp 16 | Ht 67.0 in | Wt 182.2 lb

## 2024-03-23 DIAGNOSIS — J020 Streptococcal pharyngitis: Secondary | ICD-10-CM | POA: Diagnosis not present

## 2024-03-23 DIAGNOSIS — K8020 Calculus of gallbladder without cholecystitis without obstruction: Secondary | ICD-10-CM

## 2024-03-23 DIAGNOSIS — F439 Reaction to severe stress, unspecified: Secondary | ICD-10-CM | POA: Diagnosis not present

## 2024-03-23 DIAGNOSIS — J4520 Mild intermittent asthma, uncomplicated: Secondary | ICD-10-CM | POA: Diagnosis not present

## 2024-03-23 DIAGNOSIS — M25473 Effusion, unspecified ankle: Secondary | ICD-10-CM | POA: Diagnosis not present

## 2024-03-23 DIAGNOSIS — E7800 Pure hypercholesterolemia, unspecified: Secondary | ICD-10-CM | POA: Diagnosis not present

## 2024-03-23 DIAGNOSIS — Z860100 Personal history of colon polyps, unspecified: Secondary | ICD-10-CM

## 2024-03-23 DIAGNOSIS — R195 Other fecal abnormalities: Secondary | ICD-10-CM

## 2024-03-23 MED ORDER — TRIAMCINOLONE ACETONIDE 0.1 % EX CREA: 1.0000 | TOPICAL_CREAM | Freq: Two times a day (BID) | CUTANEOUS | 0 refills | Status: AC

## 2024-03-23 MED ORDER — SACCHAROMYCES BOULARDII 250 MG PO CAPS: 250.0000 mg | ORAL_CAPSULE | Freq: Every day | ORAL | 0 refills | Status: AC

## 2024-03-23 MED ORDER — VITAMIN D (ERGOCALCIFEROL) 1.25 MG (50000 UNIT) PO CAPS: 50000.0000 [IU] | ORAL_CAPSULE | ORAL | 0 refills | Status: AC
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
## 2024-03-23 NOTE — Assessment & Plan Note (Addendum)
 Low cholesterol diet and exercise. Follow lipid panel and liver function tests. Discussed recent labs. Off crestor . Restart.  The 10-year ASCVD risk score (Arnett DK, et al., 2019) is: 10.2%   Values used to calculate the score:     Age: 73 years     Clincally relevant sex: Female     Is Non-Hispanic African American: No     Diabetic: No     Tobacco smoker: No     Systolic Blood Pressure: 122 mmHg     Is BP treated: No     HDL Cholesterol: 36.9 mg/dL     Total Cholesterol: 201 mg/dL

## 2024-03-24 ENCOUNTER — Other Ambulatory Visit: Payer: Self-pay | Admitting: Internal Medicine

## 2024-03-24 DIAGNOSIS — Z1231 Encounter for screening mammogram for malignant neoplasm of breast: Secondary | ICD-10-CM

## 2024-03-29 ENCOUNTER — Encounter: Payer: Self-pay | Admitting: Internal Medicine

## 2024-03-29 NOTE — Assessment & Plan Note (Signed)
 No evidence of volume overload on exam. Compression hose as directed. Follow.
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
## 2024-03-29 NOTE — Assessment & Plan Note (Signed)
Colonoscopy 09/2022 - unremarkable.

## 2024-03-29 NOTE — Assessment & Plan Note (Signed)
 GGallbladder - EF - decreased - HIDA. Have discussed cholecystectomy. Has declined. Wanted to monitor symptoms.

## 2024-03-29 NOTE — Assessment & Plan Note (Signed)
.   Dr Malka felt her DOE and chest tightness is suggestive of long haul covid. Recommended PFTs. Trial of ICS/LABA. Continue f/u with pulmonary.

## 2024-04-05 DIAGNOSIS — M542 Cervicalgia: Secondary | ICD-10-CM | POA: Diagnosis not present

## 2024-04-05 DIAGNOSIS — M545 Low back pain, unspecified: Secondary | ICD-10-CM | POA: Diagnosis not present

## 2024-04-06 ENCOUNTER — Ambulatory Visit: Admitting: Pulmonary Disease

## 2024-04-06 ENCOUNTER — Other Ambulatory Visit: Payer: Self-pay | Admitting: Internal Medicine

## 2024-04-06 ENCOUNTER — Encounter: Payer: Self-pay | Admitting: Pulmonary Disease

## 2024-04-06 VITALS — BP 118/68 | HR 87 | Temp 97.5°F | Ht 67.0 in | Wt 184.2 lb

## 2024-04-06 DIAGNOSIS — R0602 Shortness of breath: Secondary | ICD-10-CM | POA: Diagnosis not present

## 2024-04-06 DIAGNOSIS — R0609 Other forms of dyspnea: Secondary | ICD-10-CM | POA: Diagnosis not present

## 2024-04-06 LAB — PULMONARY FUNCTION TEST
DL/VA % pred: 114 %
DL/VA: 4.66 ml/min/mmHg/L
DLCO unc % pred: 103 %
DLCO unc: 21.9 ml/min/mmHg
FEF 25-75 Post: 3 L/s
FEF 25-75 Pre: 2.46 L/s
FEF2575-%Change-Post: 21 %
FEF2575-%Pred-Post: 147 %
FEF2575-%Pred-Pre: 121 %
FEV1-%Change-Post: 6 %
FEV1-%Pred-Post: 96 %
FEV1-%Pred-Pre: 91 %
FEV1-Post: 2.45 L
FEV1-Pre: 2.31 L
FEV1FVC-%Change-Post: 0 %
FEV1FVC-%Pred-Pre: 106 %
FEV6-%Change-Post: 5 %
FEV6-%Pred-Post: 93 %
FEV6-%Pred-Pre: 88 %
FEV6-Post: 2.98 L
FEV6-Pre: 2.83 L
FEV6FVC-%Pred-Post: 104 %
FEV6FVC-%Pred-Pre: 104 %
FVC-%Change-Post: 5 %
FVC-%Pred-Post: 90 %
FVC-%Pred-Pre: 85 %
FVC-Post: 3.02 L
FVC-Pre: 2.86 L
Post FEV1/FVC ratio: 81 %
Post FEV6/FVC ratio: 100 %
Pre FEV1/FVC ratio: 81 %
Pre FEV6/FVC Ratio: 100 %
RV % pred: 95 %
RV: 2.25 L
TLC % pred: 77 %
TLC: 4.26 L

## 2024-04-06 NOTE — Telephone Encounter (Signed)
 Copied from CRM [id number]. Topic: Clinical - Medication Refill >> Apr 06, 2024  3:04 PM Nathanel DEL wrote: Medication: fluticasone -salmeterol (WIXELA INHUB) 250-50 MCG/ACT AEPB  Has the patient contacted their pharmacy? Yes (Agent: If no, request that the patient contact the pharmacy for the refill. If patient does not wish to contact the pharmacy document the reason why and proceed with request.) (Agent: If yes, when and what did the pharmacy advise?)  This is the patient's preferred pharmacy:  [id number] [place], [place] - [address] AT [facility] [address] [place] [address] [place] [address] Phone: [phone] Fax: [phone]  Is this the correct pharmacy for this prescription? Yes If no, delete pharmacy and type the correct one.   Has the prescription been filled recently? Yes  Is the patient out of the medication? Yes  Has the patient been seen for an appointment in the last year OR does the patient have an upcoming appointment? Yes  Can we respond through MyChart? No  Pt states [facility] told her there were no refills on this Rx when she called them for refill.

## 2024-04-06 NOTE — Progress Notes (Signed)
 Full PFT completed today ? ?

## 2024-04-06 NOTE — Patient Instructions (Signed)
 Full PFT completed today ? ?

## 2024-04-06 NOTE — Progress Notes (Unsigned)
 Synopsis: Referred in by Malka Domino, MD   Subjective:   PATIENT ID: Lynn Mann GENDER: female DOB: 07-01-1951, MRN: [id number]  Chief Complaint  Patient presents with   Shortness of Breath    SOB. Wheezing. Cough, dry.    HPI Lynn Mann is a pleasant 73 year old female patient with a past medical history of allergic rhinitis, hyperlipidemia, GERD presenting today to the pulmonary clinic for further evaluation of shortness of breath specifically on exertion.  She reports that she has been experiencing progressively worsening shortness of breath specifically on exertion.  Described as inability to feel her lungs with air.  She had COVID about a year ago and since then has worsened.  It is associated with dry coughing spells intermittently.  Her chest does feel heavy sometimes but she denies any chest pain. '  She was given the diagnosis of asthma about 15 years ago however after couple of years someone told her she does not have asthma'.   She denies any hypersensitivity to heat or humidity, cold air or strong scents.  She underwent a stress echo in October 2024 which was normal.   OV 04/06/2024 - Lynn Mann is here to follow up on her PFT results. The latter show some response to bronchodilator but does not meet ATS criteria. Lung volumes suggestive of air trapping, picture suggestive of reactive airway disease. She is very happy with Napoleon which she will continue and use albuterol  as needed.   CXR 03/2023 - Unremarkable.   Family history -daughter with asthma.  Social history -never smoker, denies alcohol use.  Denies any pets at home.     ROS All systems were reviewed and are negative except for the above.  Objective:   Vitals:   04/06/24 1437  BP: 118/68  Pulse: 87  Temp: (!) 97.5 F (36.4 C)  SpO2: 99%  Weight: 184 lb 3.2 oz (83.6 kg)  Height: 5' 7 (1.702 m)   99% on RA BMI Readings from Last 3 Encounters:  04/06/24 28.85 kg/m  04/06/24  28.85 kg/m  03/23/24 28.54 kg/m   Wt Readings from Last 3 Encounters:  04/06/24 184 lb 3.2 oz (83.6 kg)  04/06/24 184 lb 3.2 oz (83.6 kg)  03/23/24 182 lb 3.2 oz (82.6 kg)    Physical Exam GEN: NAD, Healthy Appearing HEENT: Supple Neck, Reactive Pupils, EOMI  CVS: Normal S1, Normal S2, RRR, No murmurs or ES appreciated  Lungs: Clear bilateral air entry.  Abdomen: Soft, non tender, non distended, + BS  Extremities: Warm and well perfused, No edema  Skin: No suspicious lesions appreciated  Psych: Normal Affect  Ancillary Information   CBC    Component Value Date/Time   WBC 6.1 12/15/2023 1403   RBC 4.60 12/15/2023 1403   HGB 13.3 12/15/2023 1403   HGB 14.1 12/17/2011 1654   HCT 40.7 12/15/2023 1403   HCT 42.5 12/17/2011 1654   PLT 330.0 12/15/2023 1403   PLT 271 12/17/2011 1654   MCV 88.3 12/15/2023 1403   MCV 88 12/17/2011 1654   MCH 29.2 08/02/2022 1809   MCHC 32.8 12/15/2023 1403   RDW 14.2 12/15/2023 1403   RDW 13.3 12/17/2011 1654   LYMPHSABS 1.8 12/15/2023 1403   MONOABS 0.4 12/15/2023 1403   EOSABS 0.0 12/15/2023 1403   BASOSABS 0.1 12/15/2023 1403   Labs and imaging were reviewed.     Latest Ref Rng & Units 04/06/2024   12:46 PM 05/16/2016    3:02 PM  PFT Results  FVC-Pre L 2.86  P 3.35  P  FVC-Predicted Pre % 85  P 93  P  FVC-Post L 3.02  P 3.38  P  FVC-Predicted Post % 90  P 94  P  Pre FEV1/FVC % % 81  P 81  P  Post FEV1/FCV % % 81  P 82  P  FEV1-Pre L 2.31  P 2.72  P  FEV1-Predicted Pre % 91  P 98  P  FEV1-Post L 2.45  P 2.78  P  DLCO uncorrected ml/min/mmHg 21.90  P 23.78  P  DLCO UNC% % 103  P 84  P  DLVA Predicted % 114  P 89  P  TLC L 4.26  P   TLC % Predicted % 77  P   RV % Predicted % 95  P     P Preliminary result     Assessment & Plan:  Lynn Mann is a pleasant 73 year old female patient with a past medical history of allergic rhinitis, hyperlipidemia, GERD presenting today to the pulmonary clinic for further evaluation of  shortness of breath specifically on exertion.  My impression is that her dyspnea on exertion, chest tightness and dry coughing spells that worsened post-COVID is suggestive of long-haul COVID.  Feno 20 indeterminate for the presence of intrapulmonary eosinophilic inflammation  Cardiac workup has been negative so far.  PFTs 2025 show some response to bronchodilator but does not meet ATS criteria. Lung volumes suggestive of air trapping, picture suggestive of reactive airway disease.    []  C/w Wixela 250-50 1 puff BID. Advised mouth rinsing after each use.  []  C/w Albuterol  as needed.   RTC 6 monhts.   I spent 20 minutes caring for this patient today, including preparing to see the patient, obtaining a medical history , reviewing a separately obtained history, performing a medically appropriate examination and/or evaluation, counseling and educating the patient/family/caregiver, documenting clinical information in the electronic health record, and independently interpreting results (not separately reported/billed) and communicating results to the patient/family/caregiver  Darrin Barn, MD [facility] 04/10/2024 11:36 AM

## 2024-04-07 NOTE — Telephone Encounter (Signed)
 In reviewing medication history, it appears that last note - she was not taking this medication. Also appears was prescribed by pulmonary. Need to clarify with pt if she is taking and need to know if pulmonary is still prescribing.

## 2024-04-07 NOTE — Telephone Encounter (Signed)
 Pt called back she states she is getting WIXELA  medication from the pulmonary dr and he is still prescribing it you can refuse this medication

## 2024-04-07 NOTE — Telephone Encounter (Signed)
 Called pt to confirm but didn't answer her VM is full and unable to leave message will try to call back

## 2024-04-08 ENCOUNTER — Telehealth: Payer: Self-pay | Admitting: Internal Medicine

## 2024-04-08 DIAGNOSIS — R059 Cough, unspecified: Secondary | ICD-10-CM

## 2024-04-08 NOTE — Telephone Encounter (Signed)
 Copied from CRM #[id number]. Topic: Referral - Question >> Apr 08, 2024  3:09 PM Tysheama G wrote: Reason for CRM: Patient has been experiencing mold and wants to get a mold testing done. Callback number [phone]

## 2024-04-08 NOTE — Telephone Encounter (Signed)
 Can we do this testing?

## 2024-04-08 NOTE — Telephone Encounter (Signed)
 I had referred her to pulmonary with concerns regarding her breathing, cough and mold exposure.

## 2024-04-09 NOTE — Telephone Encounter (Signed)
 Called patient. Unable to leave message. Please relay message when she returns call.

## 2024-04-09 NOTE — Telephone Encounter (Unsigned)
 Copied from CRM #[id number]. Topic: Referral - Question >> Apr 09, 2024  8:41 AM Laymon HERO wrote: Reason for CRM: patient wanting to get a referral to an allergy doctor to get tested for mold in her home.

## 2024-04-12 NOTE — Telephone Encounter (Signed)
 Called patient to clarify why allergy referral is needed. Unable to leave message.

## 2024-04-16 ENCOUNTER — Ambulatory Visit
Admission: RE | Admit: 2024-04-16 | Discharge: 2024-04-16 | Disposition: A | Discharge status: Home / Self Care | Source: Ambulatory Visit | Attending: Internal Medicine | Admitting: Internal Medicine

## 2024-04-16 DIAGNOSIS — Z1231 Encounter for screening mammogram for malignant neoplasm of breast: Secondary | ICD-10-CM | POA: Insufficient documentation

## 2024-04-20 ENCOUNTER — Telehealth: Payer: Self-pay
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
## 2024-04-20 NOTE — Telephone Encounter (Signed)
 Copied from CRM #[id number]. Topic: Clinical - Lab/Test Results >> Apr 20, 2024  4:56 PM Rea C wrote: Reason for CRM: Patient is calling to go over Mammogram results.   [phone] (M)
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
## 2024-04-21 NOTE — Telephone Encounter (Signed)
 Patient wants the allergy referral to someone in [place] for allergy testing. Refer to Dr Frutoso?

## 2024-04-21 NOTE — Telephone Encounter (Signed)
 See other note

## 2024-04-21 NOTE — Telephone Encounter (Signed)
 She was concerned regarding possible eposures in her house.  If declines allergist, would recommend f/u with pulmonary for further evaluation of possible exposure and persistent cough.

## 2024-04-21 NOTE — Addendum Note (Signed)
 Addended by: GLENDIA ALLENA RAMAN on: 04/21/2024 12:30 PM   Modules accepted: Orders

## 2024-04-21 NOTE — Telephone Encounter (Signed)
Order placed for referral to allergist.  °

## 2024-04-21 NOTE — Telephone Encounter (Signed)
 Patient aware of results.

## 2024-04-27 ENCOUNTER — Ambulatory Visit: Admitting: Physical Therapy

## 2024-04-28 DIAGNOSIS — I341 Nonrheumatic mitral (valve) prolapse: Secondary | ICD-10-CM | POA: Diagnosis not present

## 2024-04-28 DIAGNOSIS — R0609 Other forms of dyspnea: Secondary | ICD-10-CM | POA: Diagnosis not present

## 2024-04-28 DIAGNOSIS — E782 Mixed hyperlipidemia: Secondary | ICD-10-CM | POA: Diagnosis not present

## 2024-04-28 DIAGNOSIS — E7800 Pure hypercholesterolemia, unspecified: Secondary | ICD-10-CM | POA: Diagnosis not present

## 2024-04-29 ENCOUNTER — Other Ambulatory Visit: Payer: Self-pay | Admitting: Internal Medicine

## 2024-04-29 DIAGNOSIS — I341 Nonrheumatic mitral (valve) prolapse: Secondary | ICD-10-CM

## 2024-04-29 DIAGNOSIS — R0609 Other forms of dyspnea: Secondary | ICD-10-CM

## 2024-04-29 DIAGNOSIS — E7800 Pure hypercholesterolemia, unspecified: Secondary | ICD-10-CM

## 2024-04-29 DIAGNOSIS — E782 Mixed hyperlipidemia: Secondary | ICD-10-CM

## 2024-05-04 ENCOUNTER — Ambulatory Visit: Attending: Urology | Admitting: Physical Therapy

## 2024-05-04 DIAGNOSIS — N3281 Overactive bladder: Secondary | ICD-10-CM | POA: Diagnosis not present

## 2024-05-04 DIAGNOSIS — M533 Sacrococcygeal disorders, not elsewhere classified: Secondary | ICD-10-CM | POA: Insufficient documentation

## 2024-05-04 DIAGNOSIS — M2170 Unequal limb length (acquired), unspecified site: Secondary | ICD-10-CM | POA: Insufficient documentation

## 2024-05-04 DIAGNOSIS — N3946 Mixed incontinence: Secondary | ICD-10-CM | POA: Diagnosis not present

## 2024-05-04 DIAGNOSIS — R2689 Other abnormalities of gait and mobility: Secondary | ICD-10-CM | POA: Diagnosis not present

## 2024-05-04 DIAGNOSIS — M6281 Muscle weakness (generalized): Secondary | ICD-10-CM | POA: Insufficient documentation
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
## 2024-05-04 NOTE — Therapy (Deleted)
 OUTPATIENT PHYSICAL THERAPY EVALUATION   Patient Name: TISA WEISEL MRN: [id number] DOB:01-13-1951, 73 y.o., female Today's Date: 05/04/2024   PT End of Session - 05/04/24 1343     Visit Number 1    Number of Visits 10    Date for Recertification  07/13/24    PT Start Time 1337    PT Stop Time 1415    PT Time Calculation (min) 38 min          Past Medical History:  Diagnosis Date   Arthritis    C. difficile diarrhea    age 49s-40s    Chicken pox    Cholecystitis 11/2011   Did not require sgy - Dr. CANDIE Hood - [facility]  (cholelithiasis)   H/O Clostridium difficile infection    IBS (irritable bowel syndrome)    MRSA exposure 2005   Spider bite   MVP (mitral valve prolapse)    Stable - Dr. Bosie   Rheumatic fever    Past Surgical History:  Procedure Laterality Date   CATARACT EXTRACTION  [id number]   MUSCLE BIOPSY     Patient Active Problem List   Diagnosis Date Noted   Ear pain, left 11/17/2023   Knee pain 11/16/2023   Pelvic congestive syndrome 01/14/2023   Estrogen deficiency 06/23/2022   Nocturia 03/12/2022   Sleep apnea 03/12/2022   Muscle weakness 03/11/2022   Joint ache 02/11/2022   Bilateral ankle pain 02/05/2022   Abdominal pain 12/26/2021   Persistent cough 09/07/2021   Right shoulder pain 07/15/2021   Hematoma 02/11/2021   Ankle swelling 11/26/2020   Low back pain 11/22/2020   Hip pain, right 11/22/2020   Light headedness 06/24/2020   Hearing loss 06/24/2020   Binocular vision disorder with diplopia 06/24/2020   Stress 05/17/2020   Chest pain 05/16/2020   Welcome to Medicare preventive visit 06/20/2019   Viral syndrome 01/30/2019   Itching 07/21/2017   Asthma 07/16/2016   Urinary incontinence 07/16/2016   SOB (shortness of breath) 04/01/2016   Bilateral shoulder pain 02/24/2016   Fatigue 01/28/2016   Neck fullness 01/28/2016   Routine general medical examination at a health care facility 07/25/2015   Health care maintenance 10/09/2014    Neck pain 11/26/2013   Hypercholesterolemia 11/26/2013   History of colonic polyps 05/11/2013   GERD (gastroesophageal reflux disease) 12/03/2012   Cholelithiasis 11/07/2012   IBS (irritable bowel syndrome) 11/07/2012   History of rheumatic fever 08/28/2012   MVP (mitral valve prolapse) 08/28/2012    PCP: Glendia   REFERRING PROVIDER: Glendia   REFERRING DIAG:  Diagnosis  R32 (ICD-10-CM) - Urinary incontinence, unspecified type   Rationale for Evaluation and Treatment Rehabilitation  THERAPY DIAG:  No diagnosis found.  ONSET DATE:   SUBJECTIVE:  SUBJECTIVE STATEMENT:  Pt is interested in having Pelvic PT assess her new Dx of umbilical hernia and strengthen her abdominal muscles and more control of her incontinence. The incontinence got better after she got COVID and then she noticed it was a problem again.   Pt is due sleep study. When she got COvid and was on liquid diet , she was only getting up to urinate 2 x night compared to 4-5 x night. Now she is recovered, she is going 3-4 x night.   Pt is due for a colonoscopy  but has not made an appt yet.   1) low abdominal pain L and R, 2-7 /10 pain which occurs after she eats. It has been better since she has been blending things. There is discomfort when she is eating too much.   2) urinary leakage: Pt reports her leakage is better and not longer flooding since her last pelvic PT sessions. Pt kept with the exercises for 10 days.   Pt is due for a cystoscopy and is delaying that because her abdominal pain from her gastroenteritis.  Pt changes 1-2 depends per day. The last couple of days it has been more but not as many as before.   Pt wants to go the gym and do water walking.   PERTINENT HISTORY:  See above   PAIN:  Are you having pain? No just  discomfort at the B low abdominal area   WEIGHT BEARING RESTRICTIONS: No    FALLS:  Has patient fallen in last 6 months? No  LIVING ENVIRONMENT: Lives with: lives alone Lives in: House/apartment Stairs: no  Has following equipment at home: None  OCCUPATION: retired, widow   PLOF: Independent  PATIENT GOALS:   strengthen her abdominal muscles and more control of her incontinence.    OBJECTIVE:    [facility] PT Assessment - 08/09/22 1141       Observation/Other Assessments   Observations less forward head posture , rounded shoulders , slumped sitting      Strength   Overall Strength Comments R hip abd 2/5, L 3-/5,  hip flex/ knee flex/ ext 3++/5 B      Palpation   SI assessment  L shoulder and iliac crest higher                  HOME EXERCISE PROGRAM: See pt instruction section    ASSESSMENT:  CLINICAL IMPRESSION:   Pt is a 73   yo  who presents with  low abdominal pain and urinary leakage   which impact QOL, ADL, fitness, and community activities.   Pt's musculoskeletal assessment revealed uneven pelvic girdle and shoulder height, dyscoordination and strength of pelvic floor mm, hip weakness, poor body mechanics which places strain on the abdominal/pelvic floor mm. These are deficits that indicate an ineffective intraabdominal pressure system associated with increased risk for pt's Sx.      Pt will benefit from coordination training and education on fitness and functional positions in order to gain a more effective intraabdominal pressure system to minimize urinary leakage.  Pt was provided education on etiology of Sx with anatomy, physiology explanation with images along with the benefits of customized pelvic PT Tx based on pt's medical conditions and musculoskeletal deficits.  Explained the physiology of deep core mm coordination and roles of pelvic floor function in urination, defecation, sexual function, and postural control with deep core mm system.    Regional interdependent approaches will yield greater benefits in pt's POC due to  the complexity of pt's medical Hx .  Plan to address uneven pelvic girdle and spinal at next session. Deferred pt's diet and food questions to nutritionist as pt is having difficulty eating solid foods after having contracted COVID over Xmas and dealing with GI issues. Nutritionist information was emailed to pt.   Pt benefits from skilled PT.    OBJECTIVE IMPAIRMENTS decreased activity tolerance, decreased coordination, decreased endurance, decreased mobility, difficulty walking, decreased ROM, decreased strength, decreased safety awareness, hypomobility, increased muscle spasms, impaired flexibility, improper body mechanics, postural dysfunction, and pain. scar restrictions   ACTIVITY LIMITATIONS  self-care,  sleep, home chores, work tasks , fitness   PARTICIPATION LIMITATIONS:  community, gym activities    PERSONAL FACTORS   medical workup for GI and recovery from COVID    are also affecting patient's functional outcome.    REHAB POTENTIAL: Good   CLINICAL DECISION MAKING: Evolving/moderate complexity   EVALUATION COMPLEXITY: Moderate    PATIENT EDUCATION:    Education details: Showed pt anatomy images. Explained muscles attachments/ connection, physiology of deep core system/ spinal- thoracic-pelvis-lower kinetic chain as they relate to pt's presentation, Sx, and past Hx. Explained what and how these areas of deficits need to be restored to balance and function    See Therapeutic activity / neuromuscular re-education section  Answered pt's questions.   Person educated: Patient Education method: Explanation, Demonstration, Tactile cues, Verbal cues, and Handouts Education comprehension: verbalized understanding, returned demonstration, verbal cues required, tactile cues required, and needs further education     PLAN: PT FREQUENCY: 1x/week   PT DURATION: 10 weeks   PLANNED INTERVENTIONS:  Therapeutic exercises, Therapeutic activity, Neuromuscular re-education, Balance training, Gait training, Patient/Family education, Self Care, Joint mobilization, Spinal mobilization, Moist heat, Taping, and Manual therapy, dry needling.   PLAN FOR NEXT SESSION: See clinical impression for plan     GOALS: Goals reviewed with patient? Yes  SHORT TERM GOALS: Target date: 09/05/2022    Pt will demo IND with HEP                    Baseline: Not IND            Goal status: INITIAL   LONG TERM GOALS: Target date: 10/17/2022    1.Pt will demo proper deep core coordination without chest breathing and optimal excursion of diaphragm/pelvic floor in order to promote spinal stability and pelvic floor function  Baseline: dyscoordination Goal status: INITIAL  2.  Pt will demo > 5 pt change on FOTO  to improve QOL and function   Pelvic Pain baseline - PFDI Urinary baseline - Bowel  constipation baseline - Bowel Leakage baseline - Urinary Problem baseline- PFDI Bowel -    Goal status: INITIAL  3.  Pt will demo proper body mechanics in against gravity tasks and ADLs  work tasks, fitness  to minimize straining pelvic floor / back                  Baseline: not IND, improper form that places strain on pelvic floor                Goal status: INITIAL    4. Pt will demo levelled pelvic girdle and shoulder height in order to progress to deep core strengthening HEP and restore mobility at spine, pelvis, gait, posture   Baseline: L shoulder and iliac crest higher  Goal status: INITIAL    5. Pt will increase hip abd B > 4/5 in order to  improve pelvic girdle stability and minimize urinary leakage  Baseline: R hip abd 2/5, L 3-/5,  Goal status: INITIAL      Pia Lupe Plump, PT 05/04/2024, 1:53 PM

## 2024-05-11 ENCOUNTER — Ambulatory Visit: Admitting: Physical Therapy

## 2024-05-11 DIAGNOSIS — M2170 Unequal limb length (acquired), unspecified site: Secondary | ICD-10-CM

## 2024-05-11 DIAGNOSIS — R2689 Other abnormalities of gait and mobility: Secondary | ICD-10-CM

## 2024-05-11 DIAGNOSIS — M6281 Muscle weakness (generalized): Secondary | ICD-10-CM

## 2024-05-11 NOTE — Patient Instructions (Signed)
 Avoid straining pelvic floor, abdominal muscles , spine  Use log rolling technique instead of getting out of bed with your neck or the sit-up  Log rolling into and out of bed Log rolling into and out of bed If getting out of bed on R side, Bent knees, scoot hips/ shoulder to L  Raise R arm completely overhead, rolling onto armpit  Then lower bent knees to bed to get into complete side lying position  Then drop legs off bed, and push up onto R elbow/forearm, and use L hand to push onto the bed __ Proper body mechanics with getting out of a chair to decrease strain  on back &pelvic floor   Avoid holding your breath when Getting out of the chair:  Scoot to front part of chair chair Heels behind knees, feet are hip width apart, nose over toes  Inhale like you are smelling roses Exhale to stand  ___  Sitting with feet on ground, four points of contact Catch yourself crossing ankles and thighs   __   Minisquat: Scoot buttocks back slight, hinge like you are looking at your reflection on a pond  Knees behind toes,  Inhale to smell flowers  Exhale on the rise like rocket  Do not lock knees, have more weight across ballmounds of feet, toes relaxed and spread them, not grip them   10 reps x 3 x day   ___  Deep core level 1 ( semi reclined position with pillows set up like ramp)  10 reps  x 3 x day
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
## 2024-05-18 ENCOUNTER — Ambulatory Visit: Admitting: Physical Therapy

## 2024-05-18 DIAGNOSIS — M2170 Unequal limb length (acquired), unspecified site: Secondary | ICD-10-CM | POA: Diagnosis not present

## 2024-05-18 DIAGNOSIS — R2689 Other abnormalities of gait and mobility: Secondary | ICD-10-CM

## 2024-05-18 DIAGNOSIS — M533 Sacrococcygeal disorders, not elsewhere classified: Secondary | ICD-10-CM

## 2024-05-18 DIAGNOSIS — M6281 Muscle weakness (generalized): Secondary | ICD-10-CM
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
## 2024-05-19 NOTE — Patient Instructions (Signed)
 6 directions of neck before doing deep core level 1-2

## 2024-05-25 ENCOUNTER — Encounter: Admitting: Physical Therapy

## 2024-06-01 ENCOUNTER — Ambulatory Visit: Admitting: Physical Therapy

## 2024-06-04 ENCOUNTER — Telehealth (HOSPITAL_COMMUNITY): Payer: Self-pay | Admitting: Emergency Medicine

## 2024-06-04 ENCOUNTER — Telehealth (HOSPITAL_COMMUNITY): Payer: Self-pay | Admitting: *Deleted

## 2024-06-04 NOTE — Telephone Encounter (Signed)
 Patient returning call about her upcoming cardiac imaging study; pt verbalizes understanding of appt date/time, parking situation and where to check in, pre-test NPO status and medications ordered, and verified current allergies; name and call back number provided for further questions should they arise  Larey Brick RN Navigator Cardiac Imaging [facility] [phone] office [phone] cell  Patient to take 25mg  metoprolol tartrate two hours prior to her cardiac CT scan.

## 2024-06-04 NOTE — Telephone Encounter (Signed)
 Unable to leave vm Rockwell Alexandria RN Navigator Cardiac Imaging [facility] [phone] Office  [phone] Cell

## 2024-06-07 ENCOUNTER — Ambulatory Visit: Admitting: Urology

## 2024-06-07 ENCOUNTER — Ambulatory Visit
Admission: RE | Admit: 2024-06-07 | Discharge: 2024-06-07 | Disposition: A | Discharge status: Home / Self Care | Source: Ambulatory Visit | Attending: Internal Medicine | Admitting: Internal Medicine

## 2024-06-07 ENCOUNTER — Other Ambulatory Visit: Payer: Self-pay | Admitting: Internal Medicine

## 2024-06-07 VITALS — BP 126/76 | HR 82 | Ht 67.0 in | Wt 175.0 lb

## 2024-06-07 DIAGNOSIS — R0609 Other forms of dyspnea: Secondary | ICD-10-CM | POA: Diagnosis not present

## 2024-06-07 DIAGNOSIS — E7800 Pure hypercholesterolemia, unspecified: Secondary | ICD-10-CM | POA: Diagnosis not present

## 2024-06-07 DIAGNOSIS — N3281 Overactive bladder: Secondary | ICD-10-CM | POA: Diagnosis not present

## 2024-06-07 DIAGNOSIS — I341 Nonrheumatic mitral (valve) prolapse: Secondary | ICD-10-CM | POA: Insufficient documentation

## 2024-06-07 DIAGNOSIS — E782 Mixed hyperlipidemia: Secondary | ICD-10-CM | POA: Insufficient documentation

## 2024-06-07 LAB — URINALYSIS, COMPLETE
Bilirubin, UA: NEGATIVE
Glucose, UA: NEGATIVE
Ketones, UA: NEGATIVE
Leukocytes,UA: NEGATIVE
Nitrite, UA: NEGATIVE
Protein,UA: NEGATIVE
RBC, UA: NEGATIVE
Specific Gravity, UA: 1.015 (ref 1.005–1.030)
Urobilinogen, Ur: 0.2 mg/dL (ref 0.2–1.0)
pH, UA: 6 (ref 5.0–7.5)

## 2024-06-07 LAB — MICROSCOPIC EXAMINATION: Bacteria, UA: NONE SEEN

## 2024-06-07 MED ORDER — NITROGLYCERIN 0.4 MG SL SUBL
0.8000 mg | SUBLINGUAL_TABLET | Freq: Once | SUBLINGUAL | Status: AC
  Administered 2024-06-07: 0.8 mg via SUBLINGUAL
  Filled 2024-06-07: qty 25

## 2024-06-07 MED ORDER — METOPROLOL TARTRATE 5 MG/5ML IV SOLN
10.0000 mg | Freq: Once | INTRAVENOUS | Status: DC | PRN
Start: 2024-06-07 — End: 2024-06-08
  Filled 2024-06-07: qty 10

## 2024-06-07 MED ORDER — DILTIAZEM HCL 25 MG/5ML IV SOLN
10.0000 mg | INTRAVENOUS | Status: DC | PRN
  Filled 2024-06-07: qty 5

## 2024-06-07 MED ORDER — IOHEXOL 350 MG/ML SOLN
100.0000 mL | Freq: Once | INTRAVENOUS | Status: AC | PRN
  Administered 2024-06-07: 100 mL via INTRAVENOUS

## 2024-06-07 NOTE — Patient Instructions (Signed)

## 2024-06-07 NOTE — Progress Notes (Signed)
 06/07/2024 9:17 AM   Rock JULIANNA Sous 10-Sep-1950 [id number]  Referring provider: Glendia Shad, MD [address] [place],  [place] [address]  Chief Complaint  Patient presents with   Follow-up   Over Active Bladder    HPI: SN: Mixed incontinence not interested in medication and opted for percutaneous tibial nerve stimulation.  She was given Myrbetriq  and pelvic floor therapy   Patient currently primarily has foot on the floor syndrome.  It can be quite high-volume and she wears a number of pads at night.  She has rare urge incontinence during the day.  She can hold urination for 2 to 5 hours but sometimes she will have key in the door syndrome but otherwise no urge incontinence.  She voids 2-4 times a night and has intermittent ankle edema does not take a diuretic   No stress incontinence or bedwetting   Flow was good and she feels empty   Failed Myrbetriq .  Myrbetriq  before COVID helped some.  She has not had a hysterectomy   On pelvic examination well supported bladder neck and negative cough test.  Grade 1 cystocele     patient clinically has an overactive bladder and likely has an element of a nocturnal diuresis.  When she holds it too long during the day she can have uncommon key in door syndrome.  Reassess for cystoscopy in 6 weeks on Gemtesa  samples and prescription.  Antimuscarinics and third line therapies especially percutaneous tibial nerve stimulation may be very good options to consider based on severity of symptoms.  Botox and InterStim or other options.  I do not think at this stage she needs urodynamics   Patient can be sitting in bed and also get some urgency possibly triggered by standing.  She has gone back to physical therapy but they held it for now because she is having a little bit of lightheadedness.  This should be taken into consideration if we give her antimuscarinics.   For unclear reasons did not take all of or some of the Gemtesa   samples.  Still has urge incontinence.  . On pelvic examination well supported bladder neck and a negative cough test after cystoscopy Cystoscopy: Normal   I thought it was reasonable to try the Gemtesa  again in the future may consider third line therapies and/or antimuscarinics as noted above.  She has nonspecific GI issues and I will call if culture positive.  Cannot remember why she did not take the medicine    Patient had decreased nocturia but was still symptomatic with the Gemtesa .  She started going more frequently during the day.  She still has foot on the floor syndrome and little to no urge incontinence during the day.  She did start estrogen by gynecology.  She was just diagnosed as having mild sleep apnea and is going to start CPAP.  She also had nasal stuffiness from Gemtesa .  She may have a vascular procedure for congestion syndrome that may be affecting her renal vein.  She was seen by vascular surgery     Patient understands pelvic congestion syndrome not related to her bladder dysfunction my opinion. Reassess in 4 months. I went over 3 refractory treatments with full template and handouts. Role of antimuscarinic discussed. With the 2 treatments above I thought it was best not to start another treatment yet especially with the CPAP which could help her nighttime symptoms. Assessed 4 months    Today Patient has not started CPAP or been treated by vascular  surgery yet.  She no longer takes the Gemtesa .  Still has some urgency incontinence at night as noted.  Frequency stable.  I answered multiple questions about diet and many things she found online which were not related to her problem.  Role of physical therapy with electrical stimulation discussed again. Referred to PT   Today   Clinically patient not infected today but has nonspecific back pain and wants her urine checked for infection.  I will call if positive.  She fell recently and CT of the head I reviewed.  She has some white  matter hypodensities that she was concerned may be causing her incontinence.  This was discussed.  Incontinence stable.  Frequency stable. Call if culture positive. She was to see physical therapy. Pelvic floor physical therapy was not organized last time for some reason. Reassess in 6 months     Today  Patient just started doing physical therapy and she thinks is going to help.  Incontinence stable.  Frequency stable.  No infections. PMH: Past Medical History:  Diagnosis Date   Arthritis    C. difficile diarrhea    age 40s-40s    Chicken pox    Cholecystitis 11/2011   Did not require sgy - Dr. CANDIE Hood - [facility]  (cholelithiasis)   H/O Clostridium difficile infection    IBS (irritable bowel syndrome)    MRSA exposure 2005   Spider bite   MVP (mitral valve prolapse)    Stable - Dr. Bosie   Rheumatic fever     Surgical History: Past Surgical History:  Procedure Laterality Date   CATARACT EXTRACTION  [id number]   MUSCLE BIOPSY      Home Medications:  Allergies as of 06/07/2024       Reactions   Other Anaphylaxis   Artificial sweetener   Silicone Other (See Comments)   silicones   Adhesive [tape]    Fd&c Yellow #5 (tartrazine)    Yellow Dyes (non-tartrazine) Other (See Comments)   Arthritis pains - it is tartrazine Yellow #5        Medication List        Accurate as of June 07, 2024  9:17 AM. If you have any questions, ask your nurse or doctor.          STOP taking these medications    fluticasone -salmeterol 250-50 MCG/ACT Aepb Commonly known as: Wixela Inhub       TAKE these medications    albuterol  108 (90 Base) MCG/ACT inhaler Commonly known as: VENTOLIN  HFA Inhale 2 puffs into the lungs every 4 (four) hours as needed for wheezing or shortness of breath.   cetirizine  10 MG tablet Commonly known as: ZYRTEC  Take 1 tablet (10 mg total) by mouth daily as needed for allergies.   esomeprazole  40 MG capsule Commonly known as: NEXIUM  Take 1 capsule (40  mg total) by mouth daily.   estradiol 0.1 MG/GM vaginal cream Commonly known as: ESTRACE Insert fingertip unit vaginally and on urethra nightly x 2 weeks, then every other night x 2 weeks, then 2-3 times weekly for maintenance   fluticasone  50 MCG/ACT nasal spray Commonly known as: FLONASE  Place 2 sprays into both nostrils daily. prn   ipratropium 0.03 % nasal spray Commonly known as: ATROVENT  Place 2 sprays into both nostrils every 12 (twelve) hours.   multivitamin tablet Take 1 tablet by mouth daily.   PRESERVISION AREDS PO Take by mouth.   REFRESH OP Apply 1 drop to eye as needed.   rosuvastatin   5 MG tablet Commonly known as: CRESTOR  TAKE 1 TABLET(5 MG) BY MOUTH DAILY   saccharomyces boulardii 250 MG capsule Commonly known as: Florastor Take 1 capsule (250 mg total) by mouth daily.   senna 8.6 MG Tabs tablet Commonly known as: SENOKOT Take 1 tablet by mouth daily as needed for mild constipation.   triamcinolone  cream 0.1 % Commonly known as: KENALOG  Apply 1 Application topically 2 (two) times daily.   Vitamin D  (Ergocalciferol ) 1.25 MG (50000 UNIT) Caps capsule Commonly known as: DRISDOL  Take 1 capsule (50,000 Units total) by mouth every 7 (seven) days.   VITAMIN D3 PO Take 1 tablet by mouth daily.        Allergies:  Allergies  Allergen Reactions   Other Anaphylaxis    Artificial sweetener   Silicone Other (See Comments)    silicones   Adhesive [Tape]    Fd&C Yellow #5 (Tartrazine)    Yellow Dyes (Non-Tartrazine) Other (See Comments)    Arthritis pains - it is tartrazine Yellow #5    Family History: Family History  Problem Relation Age of Onset   Arthritis Mother    Stroke Mother    Diabetes Mother    Hypertension Mother    Arthritis Father    Stroke Father    Diabetes Paternal Grandmother    Cancer Paternal Uncle        colon   Heart disease Other        maternal and paternal side   Breast cancer Paternal Aunt    Breast cancer Cousin     Breast cancer Cousin        female cousin   Breast cancer Cousin     Social History:  reports that she has never smoked. She has never been exposed to tobacco smoke. She has never used smokeless tobacco. She reports that she does not currently use alcohol. She reports that she does not use drugs.  ROS:                                        Physical Exam: BP 126/76 (BP Location: Left Arm, Patient Position: Sitting, Cuff Size: Normal)   Pulse 82   Ht 5' 7 (1.702 m)   Wt 79.4 kg   SpO2 97%   BMI 27.41 kg/m   Constitutional:  Alert and oriented, No acute distress. HEENT: [place] AT, moist mucus membranes.  Trachea midline, no masses.   Laboratory Data: Lab Results  Component Value Date   WBC 6.1 12/15/2023   HGB 13.3 12/15/2023   HCT 40.7 12/15/2023   MCV 88.3 12/15/2023   PLT 330.0 12/15/2023    Lab Results  Component Value Date   CREATININE 0.71 03/19/2024    No results found for: PSA  No results found for: TESTOSTERONE  Lab Results  Component Value Date   HGBA1C 6.0 11/24/2023    Urinalysis    Component Value Date/Time   COLORURINE YELLOW (A) 07/25/2022 1350   APPEARANCEUR Slightly cloudy 12/01/2023 0839   LABSPEC 1.018 07/25/2022 1350   LABSPEC 1.015 12/17/2011 1804   PHURINE 5.0 07/25/2022 1350   GLUCOSEU Negative 12/01/2023 0839   GLUCOSEU NEGATIVE 01/02/2022 0909   HGBUR NEGATIVE 07/25/2022 1350   BILIRUBINUR Negative 12/01/2023 0839   BILIRUBINUR Negative 12/17/2011 1804   KETONESUR 5 (A) 07/25/2022 1350   PROTEINUR Trace 12/01/2023 0839   PROTEINUR NEGATIVE 07/25/2022 1350   UROBILINOGEN  0.2 01/02/2022 0909   NITRITE Negative 12/01/2023 0839   NITRITE NEGATIVE 07/25/2022 1350   LEUKOCYTESUR Negative 12/01/2023 0839   LEUKOCYTESUR NEGATIVE 07/25/2022 1350   LEUKOCYTESUR Negative 12/17/2011 1804    Pertinent Imaging:   Assessment & Plan: I explained to her about the pelvic floor chair that she asked me about.  She  may or may not try.  I went through PTNS and gave her handout.  Otherwise see as needed and hopefully physical therapy will help.  Call if she wants PTNS  1. OAB (overactive bladder) (Primary)  - Urinalysis, Complete   No follow-ups on file.  Glendia DELENA Elizabeth, MD  [facility] [address], [address] [place], [place] [address] [phone]

## 2024-06-07 NOTE — Progress Notes (Signed)
 Patient tolerated CT well. Vital signs stable encourage to drink water throughout day.Reasons explained and verbalized understanding. Ambulated steady gait.

## 2024-06-08 ENCOUNTER — Encounter: Admitting: Physical Therapy

## 2024-06-15 ENCOUNTER — Ambulatory Visit: Admitting: Physical Therapy

## 2024-06-22 ENCOUNTER — Encounter: Admitting: Physical Therapy

## 2024-06-29 ENCOUNTER — Encounter: Admitting: Physical Therapy

## 2024-07-06 ENCOUNTER — Encounter: Admitting: Physical Therapy

## 2024-07-06 ENCOUNTER — Ambulatory Visit: Payer: PPO

## 2024-07-06 ENCOUNTER — Ambulatory Visit: Payer: Self-pay

## 2024-07-06 ENCOUNTER — Telehealth: Payer: Self-pay | Admitting: *Deleted

## 2024-07-06 ENCOUNTER — Ambulatory Visit: Admitting: Physical Therapy

## 2024-07-06 VITALS — Ht 67.0 in | Wt 170.0 lb

## 2024-07-06 DIAGNOSIS — M2170 Unequal limb length (acquired), unspecified site: Secondary | ICD-10-CM | POA: Insufficient documentation

## 2024-07-06 DIAGNOSIS — M533 Sacrococcygeal disorders, not elsewhere classified: Secondary | ICD-10-CM | POA: Diagnosis present

## 2024-07-06 DIAGNOSIS — R2689 Other abnormalities of gait and mobility: Secondary | ICD-10-CM | POA: Diagnosis present

## 2024-07-06 DIAGNOSIS — H9202 Otalgia, left ear: Secondary | ICD-10-CM | POA: Diagnosis not present

## 2024-07-06 DIAGNOSIS — Z0000 Encounter for general adult medical examination without abnormal findings: Secondary | ICD-10-CM

## 2024-07-06 MED ORDER — ALBUTEROL SULFATE HFA 108 (90 BASE) MCG/ACT IN AERS: 2.0000 | INHALATION_SPRAY | RESPIRATORY_TRACT | 0 refills | Status: DC | PRN

## 2024-07-06 NOTE — Telephone Encounter (Signed)
 FYI Only or Action Required?: FYI only for provider: UC appointment tomorrow at 11am.  Patient was last seen in primary care on 03/23/2024 by Glendia Shad, MD.  Called Nurse Triage reporting Bleeding Ear.  Symptoms began several days ago.  Interventions attempted: Nothing.  Symptoms are: stable.  Triage Disposition: See Physician Within 24 Hours  Patient/caregiver understands and will follow disposition?:  Reason for Disposition  Unexplained bleeding from ear  Answer Assessment - Initial Assessment Questions Patient's daughter stated she did not need to go to the ER. Patient states she dried around ears after washing hair and noticed blood. Patient has UC appointment tomorrow but was calling to see if she could come in today to get her ear looked at. Advised patient she'd need an appointment to come in to the office and we do not have any available. Patient stated   you never do. Advised patient to keep her appointment at [facility] tomorrow.   1. LOCATION: Which ear is involved?      Left ear  2. COLOR: What is the color of the discharge?      Dark red  3. ONSET: When did you first notice the discharge?     Saturday  4. PAIN: Is there any earache? How bad is it?  (Scale 0-10; none, mild, moderate or severe)     Mild earache   5. OBJECTS: Have you put anything in your ear? (e.g., Q-tip, other object)      Used a q-tip to wipe the edge of the opening of the ear, has not stuck anything in the ear  6. OTHER SYMPTOMS: Do you have any other symptoms? (e.g., headache, fever, dizziness, vomiting, runny nose)     Mild headache last few days  Protocols used: Ear - Discharge-A-AH  Copied from CRM [id number]. Topic: Clinical - Red Word Triage >> Jul 06, 2024  3:26 PM Macario HERO wrote: Red Word that prompted transfer to Nurse Triage: Patient called said ear started bleeding Saturday up until yesterday.

## 2024-07-06 NOTE — Telephone Encounter (Signed)
 Agree with evaluation today.
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
## 2024-07-06 NOTE — Patient Instructions (Addendum)
 Twice a day:  1) deep core level 1   2) deep  core level 2   3)  Clam Shell 45 Degrees  Lying with hips and knees bent 45, one pillow between knees and ankles. Heel together, toes apart like ballerina,  Lift knee with exhale while pressing heels together. Be sure pelvis does not roll backward. Do not arch back. Do 10 times, each leg, 2 times per day.   4)  Minisquat: Scoot buttocks back slight, hinge like you are looking at your reflection on a pond  Knees behind toes,  Inhale to smell flowers  Exhale on the rise like rocket  Do not lock knees, have more weight across ballmounds of feet, toes relaxed and spread them, not grip them   10 reps x 3 x day   _   Replace shoe lift every 4-6 months in the R

## 2024-07-06 NOTE — Patient Instructions (Signed)
 Lynn Mann,  Thank you for taking the time for your Medicare Wellness Visit. I appreciate your continued commitment to your health goals. Please review the care plan we discussed, and feel free to reach out if I can assist you further.  Please note that Annual Wellness Visits do not include a physical exam. Some assessments may be limited, especially if the visit was conducted virtually. If needed, we may recommend an in-person follow-up with your provider.  Ongoing Care Seeing your primary care provider every 3 to 6 months helps us  monitor your health and provide consistent, personalized care.  Consider updating your vaccines  Referrals If a referral was made during today's visit and you haven't received any updates within two weeks, please contact the referred provider directly to check on the status.  Recommended Screenings:  Health Maintenance  Topic Date Due   Zoster (Shingles) Vaccine (1 of 2) Never done   COVID-19 Vaccine (3 - 2025-26 season) 03/29/2024   Pneumococcal Vaccine for age over 74 (1 of 2 - PCV) 08/11/2024*   Flu Shot  10/26/2024*   Medicare Annual Wellness Visit  07/06/2025   DTaP/Tdap/Td vaccine (2 - Td or Tdap) 07/24/2025   Breast Cancer Screening  04/16/2026   Colon Cancer Screening  10/16/2032   Osteoporosis screening with Bone Density Scan  Completed   Hepatitis C Screening  Completed   Meningitis B Vaccine  Aged Out  *Topic was postponed. The date shown is not the original due date.       07/06/2024   10:21 AM  Advanced Directives  Does Patient Have a Medical Advance Directive? No    Vision: Annual vision screenings are recommended for early detection of glaucoma, cataracts, and diabetic retinopathy. These exams can also reveal signs of chronic conditions such as diabetes and high blood pressure.  Dental: Annual dental screenings help detect early signs of oral cancer, gum disease, and other conditions linked to overall health, including heart disease  and diabetes.  Please see the attached documents for additional preventive care recommendations.

## 2024-07-06 NOTE — Telephone Encounter (Signed)
 Performed AWV   Patient stated that she has changed her pharmacy from [facility] to [facility] because of her insurance. Patient requested a refill on her Albuterol  Inhaler.  [facility]
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
## 2024-07-06 NOTE — Progress Notes (Signed)
 Chief Complaint  Patient presents with   Medicare Wellness     Subjective:   STARLENA BEIL is a 73 y.o. female who presents for a Medicare Annual Wellness Visit.  Visit info / Clinical Intake: Medicare Wellness Visit Type:: Subsequent Annual Wellness Visit Persons participating in visit and providing information:: patient Medicare Wellness Visit Mode:: Telephone If telephone:: video declined Since this visit was completed virtually, some vitals may be partially provided or unavailable. Missing vitals are due to the limitations of the virtual format.: Unable to obtain vitals - no equipment If Telephone or Video please confirm:: I connected with patient using audio/video enable telemedicine. I verified patient identity with two identifiers, discussed telehealth limitations, and patient agreed to proceed. Patient Location:: Home Provider Location:: Office/Home Interpreter Needed?: No Pre-visit prep was completed: yes AWV questionnaire completed by patient prior to visit?: no Living arrangements:: (!) lives alone Patient's Overall Health Status Rating: (!) fair Typical amount of pain: some Does pain affect daily life?: no Are you currently prescribed opioids?: no  Dietary Habits and Nutritional Risks How many meals a day?: 2 Eats fruit and vegetables daily?: (!) no Most meals are obtained by: eating out In the last 2 weeks, have you had any of the following?: (!) nausea, vomiting, diarrhea (Had a little diarrhea last week, but fine now) Diabetic:: no  Functional Status Activities of Daily Living (to include ambulation/medication): Independent Ambulation: Independent Medication Administration: Independent Home Management (perform basic housework or laundry): Independent Manage your own finances?: yes Primary transportation is: driving Concerns about vision?: (!) yes (sees a circle at night at times, has macular) Concerns about hearing?: (!) yes Uses hearing aids?: (!) yes  (has lost one) Hear whispered voice?: -- (televisit)  Fall Screening Falls in the past year?: 1 Number of falls in past year: 1 Was there an injury with Fall?: 1 Fall Risk Category Calculator: 3 Patient Fall Risk Level: High Fall Risk  Fall Risk Patient at Risk for Falls Due to: History of fall(s); Impaired balance/gait (slipped on hand soap,  and  tripped over a trailer hitch) Fall risk Follow up: Falls evaluation completed; Falls prevention discussed  Home and Transportation Safety: All rugs have non-skid backing?: yes All stairs or steps have railings?: N/A, no stairs Grab bars in the bathtub or shower?: (!) no Have non-skid surface in bathtub or shower?: yes Good home lighting?: yes Regular seat belt use?: yes Hospital stays in the last year:: no  Cognitive Assessment Difficulty concentrating, remembering, or making decisions? : no Will 6CIT or Mini Cog be Completed: yes What year is it?: 0 points What month is it?: 0 points Give patient an address phrase to remember (5 components): [address] [place] About what time is it?: 0 points Count backwards from 20 to 1: 0 points Say the months of the year in reverse: 0 points Repeat the address phrase from earlier: 0 points 6 CIT Score: 0 points  Advance Directives (For Healthcare) Does Patient Have a Medical Advance Directive?: No Would patient like information on creating a medical advance directive?: No - Patient declined  Reviewed/Updated  Reviewed/Updated: Reviewed All (Medical, Surgical, Family, Medications, Allergies, Care Teams, Patient Goals)    Allergies (verified) Other, Silicone, Adhesive [tape], Fd&c yellow #5 (tartrazine), and Yellow dyes (non-tartrazine)   Current Medications (verified) Outpatient Encounter Medications as of 07/06/2024  Medication Sig   albuterol  (VENTOLIN  HFA) 108 (90 Base) MCG/ACT inhaler Inhale 2 puffs into the lungs every 4 (four) hours as needed  for wheezing or shortness of  breath.   cetirizine  (ZYRTEC ) 10 MG tablet Take 1 tablet (10 mg total) by mouth daily as needed for allergies.   esomeprazole  (NEXIUM ) 40 MG capsule Take 1 capsule (40 mg total) by mouth daily.   estradiol (ESTRACE) 0.1 MG/GM vaginal cream Insert fingertip unit vaginally and on urethra nightly x 2 weeks, then every other night x 2 weeks, then 2-3 times weekly for maintenance   fluticasone  (FLONASE ) 50 MCG/ACT nasal spray Place 2 sprays into both nostrils daily. prn   Multiple Vitamins-Minerals (PRESERVISION AREDS PO) Take by mouth.   Polyvinyl Alcohol-Povidone (REFRESH OP) Apply 1 drop to eye as needed.   rosuvastatin  (CRESTOR ) 5 MG tablet TAKE 1 TABLET(5 MG) BY MOUTH DAILY   triamcinolone  cream (KENALOG ) 0.1 % Apply 1 Application topically 2 (two) times daily.   Cholecalciferol (VITAMIN D3 PO) Take 1 tablet by mouth daily. (Patient not taking: Reported on 07/06/2024)   ipratropium (ATROVENT ) 0.03 % nasal spray Place 2 sprays into both nostrils every 12 (twelve) hours. (Patient not taking: Reported on 07/06/2024)   Multiple Vitamin (MULTIVITAMIN) tablet Take 1 tablet by mouth daily. (Patient not taking: Reported on 07/06/2024)   saccharomyces boulardii (FLORASTOR) 250 MG capsule Take 1 capsule (250 mg total) by mouth daily. (Patient not taking: Reported on 07/06/2024)   senna (SENOKOT) 8.6 MG TABS tablet Take 1 tablet by mouth daily as needed for mild constipation. (Patient not taking: Reported on 07/06/2024)   Vitamin D , Ergocalciferol , (DRISDOL ) 1.25 MG (50000 UNIT) CAPS capsule Take 1 capsule (50,000 Units total) by mouth every 7 (seven) days. (Patient not taking: Reported on 07/06/2024)   No facility-administered encounter medications on file as of 07/06/2024.    History: Past Medical History:  Diagnosis Date   Arthritis    C. difficile diarrhea    age 70s-40s    Chicken pox    Cholecystitis 11/2011   Did not require sgy - Dr. CANDIE Hood - [facility]  (cholelithiasis)   H/O Clostridium difficile  infection    IBS (irritable bowel syndrome)    MRSA exposure 2005   Spider bite   MVP (mitral valve prolapse)    Stable - Dr. Bosie   Rheumatic fever    Past Surgical History:  Procedure Laterality Date   CATARACT EXTRACTION  [id number]   MUSCLE BIOPSY     Family History  Problem Relation Age of Onset   Arthritis Mother    Stroke Mother    Diabetes Mother    Hypertension Mother    Arthritis Father    Stroke Father    Diabetes Paternal Grandmother    Cancer Paternal Uncle        colon   Heart disease Other        maternal and paternal side   Breast cancer Paternal Aunt    Breast cancer Cousin    Breast cancer Cousin        female cousin   Breast cancer Cousin    Social History   Occupational History   Occupation: Retired  Tobacco Use   Smoking status: Never    Passive exposure: Never   Smokeless tobacco: Never  Vaping Use   Vaping status: Never Used  Substance and Sexual Activity   Alcohol use: Not Currently    Comment: Rarely   Drug use: No   Sexual activity: Yes    Partners: Male    Birth control/protection: Post-menopausal   Tobacco Counseling Counseling given: Not Answered  SDOH Screenings  Food Insecurity: No Food Insecurity (07/06/2024)  Housing: Low Risk  (07/06/2024)  Transportation Needs: No Transportation Needs (07/06/2024)  Utilities: Not At Risk (07/06/2024)  Alcohol Screen: Low Risk  (07/04/2023)  Depression (PHQ2-9): Low Risk  (07/06/2024)  Financial Resource Strain: Low Risk  (07/06/2024)  Physical Activity: Inactive (07/06/2024)  Social Connections: Moderately Integrated (07/06/2024)  Stress: No Stress Concern Present (07/06/2024)  Tobacco Use: Low Risk  (07/06/2024)  Health Literacy: Adequate Health Literacy (07/06/2024)   See flowsheets for full screening details  Depression Screen PHQ 2 & 9 Depression Scale- Over the past 2 weeks, how often have you been bothered by any of the following problems? Little interest or pleasure in doing things:  0 Feeling down, depressed, or hopeless (PHQ Adolescent also includes...irritable): 0 PHQ-2 Total Score: 0 Trouble falling or staying asleep, or sleeping too much: 0 Feeling tired or having little energy: 0 Poor appetite or overeating (PHQ Adolescent also includes...weight loss): 0 Feeling bad about yourself - or that you are a failure or have let yourself or your family down: 0 Trouble concentrating on things, such as reading the newspaper or watching television (PHQ Adolescent also includes...like school work): 0 Moving or speaking so slowly that other people could have noticed. Or the opposite - being so fidgety or restless that you have been moving around a lot more than usual: 0 Thoughts that you would be better off dead, or of hurting yourself in some way: 0 PHQ-9 Total Score: 0 If you checked off any problems, how difficult have these problems made it for you to do your work, take care of things at home, or get along with other people?: Not difficult at all     Goals Addressed             This Visit's Progress    Patient Stated       Wants to get on a routine and make her health a priority             Objective:    Today's Vitals   07/06/24 1011  Weight: 170 lb (77.1 kg)  Height: 5' 7 (1.702 m)   Body mass index is 26.63 kg/m.  Hearing/Vision screen Hearing Screening - Comments:: Wears aids, has lost one Vision Screening - Comments:: Readers, Mevelyn Slack, up to date Immunizations and Health Maintenance Health Maintenance  Topic Date Due   Zoster Vaccines- Shingrix (1 of 2) Never done   COVID-19 Vaccine (3 - 2025-26 season) 03/29/2024   Pneumococcal Vaccine: 50+ Years (1 of 2 - PCV) 08/11/2024 (Originally 09/13/1969)   Influenza Vaccine  10/26/2024 (Originally 02/27/2024)   Medicare Annual Wellness (AWV)  07/06/2025   DTaP/Tdap/Td (2 - Td or Tdap) 07/24/2025   Mammogram  04/16/2026   Colonoscopy  10/16/2032   Bone Density Scan  Completed   Hepatitis C  Screening  Completed   Meningococcal B Vaccine  Aged Out        Assessment/Plan:  This is a routine wellness examination for [place].  Patient Care Team: Glendia Shad, MD as PCP - General (Internal Medicine) Maryruth, Ole DASEN, MD as Consulting Physician (Gastroenterology) Mevelyn Romero KATHEE DEVONNA (Gastroenterology) Dewane Shiner, DO as Referring Physician (Cardiology) Verdon Keen, MD as Consulting Physician (Obstetrics and Gynecology) Maree Jannett POUR, MD as Consulting Physician (Neurology) Ashley Soulier, DPM as Referring Physician (Podiatry) Sharrie Prentice PARAS, DO as Consulting Physician (Sports Medicine) Marea Selinda RAMAN, MD as Referring Physician (Vascular Surgery) Hope Almarie ORN, NP as Nurse Practitioner (Pulmonary Disease)  I have personally reviewed and noted the following in the patient's chart:   Medical and social history Use of alcohol, tobacco or illicit drugs  Current medications and supplements including opioid prescriptions. Functional ability and status Nutritional status Physical activity Advanced directives List of other physicians Hospitalizations, surgeries, and ER visits in previous 12 months Vitals Screenings to include cognitive, depression, and falls Referrals and appointments  No orders of the defined types were placed in this encounter.  In addition, I have reviewed and discussed with patient certain preventive protocols, quality metrics, and best practice recommendations. A written personalized care plan for preventive services as well as general preventive health recommendations were provided to patient.   Angeline Fredericks, LPN   [id number]   Return in 1 year (on 07/06/2025).  After Visit Summary: (Pick Up) Due to this being a telephonic visit, with patients personalized plan was offered to patient and patient has requested to Pick up at office.  Nurse Notes: Patient declines vaccines.

## 2024-07-06 NOTE — Telephone Encounter (Signed)
 Rx ok'd for albuterol.

## 2024-07-07 ENCOUNTER — Ambulatory Visit

## 2024-07-16 ENCOUNTER — Telehealth: Payer: Self-pay

## 2024-07-16 NOTE — Telephone Encounter (Signed)
 Vm full unable leave vm. Called pt to let her know she needs to reach out to pharmacy to have active prescriptions switched. Also  pt should be otc vit d not prescription

## 2024-07-16 NOTE — Telephone Encounter (Unsigned)
 Copied from CRM [id number]. Topic: Clinical - Medication Question >> Jul 16, 2024 11:45 AM Franky GRADE wrote: Reason for CRM: Patient is calling because she changed pharmacies from [facility] and would like all active prescriptions to be sent there. She is also receiving messages that it's time to renew the prescription for the Vitamin D  but she completed the dosage requested by Dr.Scott and wanted to know if Dr.Scott wanted her to continue taking it or if she can disregard those messages.

## 2024-07-19 NOTE — Telephone Encounter (Signed)
 Attempted to call Patient- no answer and voicemail is full

## 2024-07-28 ENCOUNTER — Other Ambulatory Visit: Payer: Self-pay | Admitting: Internal Medicine

## 2024-07-28 NOTE — Telephone Encounter (Signed)
 Copied from CRM #[id number]. Topic: Clinical - Medication Refill >> Jul 28, 2024  1:27 PM Viola F wrote: Medication: albuterol  (VENTOLIN  HFA) 108 (90 Base) MCG/ACT inhaler [[id number]]  Inhaler that's covered by health [employer]   Has the patient contacted their pharmacy? Yes (Agent: If no, request that the patient contact the pharmacy for the refill. If patient does not wish to contact the pharmacy document the reason why and proceed with request.) (Agent: If yes, when and what did the pharmacy advise?)  This is the patient's preferred pharmacy:  [id number] - [place], [place] - [address] [place] [address] Phone: [phone] Fax: [phone]  Is this the correct pharmacy for this prescription? Yes If no, delete pharmacy and type the correct one.   Has the prescription been filled recently? Yes  Is the patient out of the medication? Yes  Has the patient been seen for an appointment in the last year OR does the patient have an upcoming appointment? Yes  Can we respond through MyChart? Yes  Agent: Please be advised that Rx refills may take up to 3 business days. We ask that you follow-up with your pharmacy.

## 2024-08-01 MED ORDER — ALBUTEROL SULFATE HFA 108 (90 BASE) MCG/ACT IN AERS: 2.0000 | INHALATION_SPRAY | RESPIRATORY_TRACT | 0 refills | Status: AC | PRN

## 2024-08-09 ENCOUNTER — Telehealth: Payer: Self-pay

## 2024-08-09 NOTE — Telephone Encounter (Signed)
 I spoke with the patient. She has noticed that when she did her morning dose of Wixela she would wheeze after. She stopped using the Wixela 2 months ago. No increased SOB. She has noticed a dry cough but said she is dealing with mold under her house.  She is aware that you are out of the office until 1/14.  She is scheduled to see you on 3/11.

## 2024-08-09 NOTE — Telephone Encounter (Signed)
 Copied from CRM [id number]. Topic: Clinical - Medication Question >> Aug 09, 2024 10:31 AM Lynn Mann wrote: Reason for CRM: pt states she discontinued use of Wixela due to wheezing after use

## 2024-08-10 ENCOUNTER — Telehealth: Payer: Self-pay | Admitting: Pulmonary Disease

## 2024-08-10 DIAGNOSIS — R0602 Shortness of breath: Secondary | ICD-10-CM

## 2024-08-10 MED ORDER — BUDESONIDE-FORMOTEROL FUMARATE 160-4.5 MCG/ACT IN AERO: 2.0000 | INHALATION_SPRAY | Freq: Two times a day (BID) | RESPIRATORY_TRACT | 12 refills | Status: AC

## 2024-08-10 NOTE — Telephone Encounter (Signed)
 Sending symbicort .   Darrin Barn, MD [facility] 08/10/2024 10:40 AM

## 2024-08-10 NOTE — Telephone Encounter (Signed)
 Noted. Nothing further needed.

## 2024-08-19 ENCOUNTER — Other Ambulatory Visit: Payer: Self-pay | Admitting: Gastroenterology

## 2024-08-19 ENCOUNTER — Ambulatory Visit
Admission: RE | Admit: 2024-08-19 | Discharge: 2024-08-19 | Disposition: A | Discharge status: Home / Self Care | Source: Ambulatory Visit | Attending: Gastroenterology | Admitting: Gastroenterology

## 2024-08-19 DIAGNOSIS — R1032 Left lower quadrant pain: Secondary | ICD-10-CM | POA: Insufficient documentation

## 2024-08-19 MED ORDER — IOHEXOL 300 MG/ML  SOLN
100.0000 mL | Freq: Once | INTRAMUSCULAR | Status: AC | PRN
  Administered 2024-08-19: 100 mL via INTRAVENOUS

## 2024-10-06 ENCOUNTER — Ambulatory Visit: Admitting: Pulmonary Disease
